# Patient Record
Sex: Male | Born: 1955
Health system: Southern US, Community
[De-identification: ages and names within clinical notes are randomized; demographics above are authoritative.]

## PROBLEM LIST (undated history)

## (undated) DIAGNOSIS — D696 Thrombocytopenia, unspecified: Secondary | ICD-10-CM

## (undated) DIAGNOSIS — E039 Hypothyroidism, unspecified: Secondary | ICD-10-CM

## (undated) DIAGNOSIS — R7303 Prediabetes: Secondary | ICD-10-CM

## (undated) DIAGNOSIS — K7460 Unspecified cirrhosis of liver: Secondary | ICD-10-CM

## (undated) DIAGNOSIS — K7469 Other cirrhosis of liver: Secondary | ICD-10-CM

## (undated) DIAGNOSIS — N189 Chronic kidney disease, unspecified: Secondary | ICD-10-CM

## (undated) DIAGNOSIS — D649 Anemia, unspecified: Secondary | ICD-10-CM

## (undated) DIAGNOSIS — I509 Heart failure, unspecified: Secondary | ICD-10-CM

## (undated) DIAGNOSIS — F329 Major depressive disorder, single episode, unspecified: Secondary | ICD-10-CM

## (undated) DIAGNOSIS — K7581 Nonalcoholic steatohepatitis (NASH): Secondary | ICD-10-CM

## (undated) DIAGNOSIS — R0600 Dyspnea, unspecified: Secondary | ICD-10-CM

## (undated) DIAGNOSIS — J45909 Unspecified asthma, uncomplicated: Secondary | ICD-10-CM

## (undated) DIAGNOSIS — E119 Type 2 diabetes mellitus without complications: Secondary | ICD-10-CM

## (undated) DIAGNOSIS — I10 Essential (primary) hypertension: Secondary | ICD-10-CM

## (undated) DIAGNOSIS — F419 Anxiety disorder, unspecified: Secondary | ICD-10-CM

## (undated) DIAGNOSIS — R161 Splenomegaly, not elsewhere classified: Secondary | ICD-10-CM

## (undated) HISTORY — DX: Unspecified cirrhosis of liver: K75.81

## (undated) HISTORY — DX: Unspecified cirrhosis of liver: K74.60

## (undated) HISTORY — DX: Other cirrhosis of liver: K74.69

## (undated) HISTORY — DX: Major depressive disorder, single episode, unspecified: F32.9

## (undated) HISTORY — PX: OTHER SURGICAL HISTORY: SHX169

## (undated) HISTORY — PX: CHOLECYSTECTOMY: SHX55

## (undated) HISTORY — PX: LAPAROSCOPIC ASSISTED VENTRAL HERNIA REPAIR: SHX6312

## (undated) HISTORY — DX: Hypothyroidism, unspecified: E03.9

## (undated) HISTORY — DX: Splenomegaly, not elsewhere classified: R16.1

## (undated) HISTORY — DX: Chronic kidney disease, unspecified: N18.9

## (undated) HISTORY — DX: Thrombocytopenia, unspecified: D69.6

---

## 2016-02-28 DIAGNOSIS — K635 Polyp of colon: Secondary | ICD-10-CM | POA: Insufficient documentation

## 2016-02-28 DIAGNOSIS — K760 Fatty (change of) liver, not elsewhere classified: Secondary | ICD-10-CM | POA: Insufficient documentation

## 2016-02-28 DIAGNOSIS — E119 Type 2 diabetes mellitus without complications: Secondary | ICD-10-CM | POA: Insufficient documentation

## 2016-09-20 LAB — PROTIME-INR

## 2016-10-10 ENCOUNTER — Encounter (INDEPENDENT_AMBULATORY_CARE_PROVIDER_SITE_OTHER): Payer: Self-pay | Admitting: Internal Medicine

## 2016-10-16 ENCOUNTER — Encounter (INDEPENDENT_AMBULATORY_CARE_PROVIDER_SITE_OTHER): Payer: Self-pay

## 2016-10-16 ENCOUNTER — Ambulatory Visit (INDEPENDENT_AMBULATORY_CARE_PROVIDER_SITE_OTHER): Payer: Medicaid Other | Admitting: Internal Medicine

## 2016-10-16 ENCOUNTER — Encounter (INDEPENDENT_AMBULATORY_CARE_PROVIDER_SITE_OTHER): Payer: Self-pay | Admitting: Internal Medicine

## 2016-10-16 VITALS — BP 184/100 | HR 72 | Temp 98.0°F | Ht 74.0 in | Wt 315.5 lb

## 2016-10-16 DIAGNOSIS — R161 Splenomegaly, not elsewhere classified: Secondary | ICD-10-CM | POA: Diagnosis not present

## 2016-10-16 DIAGNOSIS — F329 Major depressive disorder, single episode, unspecified: Secondary | ICD-10-CM | POA: Insufficient documentation

## 2016-10-16 DIAGNOSIS — K7460 Unspecified cirrhosis of liver: Secondary | ICD-10-CM | POA: Diagnosis not present

## 2016-10-16 DIAGNOSIS — F418 Other specified anxiety disorders: Secondary | ICD-10-CM | POA: Insufficient documentation

## 2016-10-16 DIAGNOSIS — D696 Thrombocytopenia, unspecified: Secondary | ICD-10-CM | POA: Insufficient documentation

## 2016-10-16 DIAGNOSIS — K760 Fatty (change of) liver, not elsewhere classified: Secondary | ICD-10-CM

## 2016-10-16 DIAGNOSIS — E039 Hypothyroidism, unspecified: Secondary | ICD-10-CM

## 2016-10-16 DIAGNOSIS — F32A Depression, unspecified: Secondary | ICD-10-CM

## 2016-10-16 DIAGNOSIS — R188 Other ascites: Secondary | ICD-10-CM | POA: Insufficient documentation

## 2016-10-16 HISTORY — DX: Thrombocytopenia, unspecified: D69.6

## 2016-10-16 HISTORY — DX: Hypothyroidism, unspecified: E03.9

## 2016-10-16 HISTORY — DX: Splenomegaly, not elsewhere classified: R16.1

## 2016-10-16 HISTORY — DX: Unspecified cirrhosis of liver: K74.60

## 2016-10-16 HISTORY — DX: Depression, unspecified: F32.A

## 2016-10-16 LAB — AMMONIA: Ammonia: 108 umol/L — ABNORMAL HIGH (ref <=47)

## 2016-10-16 NOTE — Progress Notes (Signed)
Subjective:    Patient ID: Daniel Crawford, male    DOB: 15-Jul-1955, 61 y.o.   MRN: [id number]  HPI Referred by Alroy Dust A. Mahmoud ([facility]) for NAFLD. Hx of same. Has relocated to [facility]. Use to live in [facility].  Hx of thrombocytopenia. Diagnosed with NAFLD in 2017.  He has never undergone a paracentesis.  He says his liver numbers have been stable since being diagnosed.  No family hx of fatty liver.  No prior hx of jaundice Appetite has been good.  No weight loss. Has a BM daily.  No melena or BRRB. No IV drugs.  Recently evaluated at [place] and treated for infectous colitis with Cipro and flagy.  Per records up to date on Hepatitis A and B vaccine.   10/08/2016 CT scan with and w/o contrast: abdominal pain Stranding about the pancreas and ascending colon. This raises concern for either non complicated pancreatitis or non specific ascending colitis.  No discrete abscess or free fluid. Findings suggestive of hepatic cirrhosis. Splenomegaly Morey be related to liver disease. No discrete hepatic lesion. Cholecystectomy. Ventral paraumbilical hernia contains fat but no bowel. There is some stranding of the fat.  Previous left lower quadrant ventral hernia or inguinal hernia repair. Some fat in the left inguinal canal without associated bowel.    Liver biopsy in 05/30/2015 showed stage 3-4.    Liver with bridging fibrosis and areas of nodular formation consistent with cirrhosis. Mild steatosis and steatohepatitis (ballooning hepatocytes, pericellular fibrosis) is seen. Iron stain negative.    10/08/2016 H and H 11.9 and 356.4,AST 36.6, ALT 22, ALP 82   09/20/2016  AFP 6.7, H and H  12.3 and 37.4, Platelet ct 86, Albumin 3.9, ALP 88, AST 41.1, ALT 27, total bilirubin 1.21, Direct bili 0.32, indirect bilirubin .89   04/17/2016 Colonoscopy:      Impression:- One 25 mm polyp at the ileocecal  valve, removed with    mucosal resection. Resected and retrieved.   - The examination was otherwise normal on direct and    retroflexion views.   - Mucosal resection was performed. Resection and retrieval  Onaka Benard Halsted, MDCollected: 04/17/2016 1556 Ordering Location: GI PERIOP UNCMHReceived:04/18/2016 1126 Pathologist: Regis Bill,   MD  Specimen:Large Intestine, Cecum, Cecal Polyp  [facility]  Final Diagnosis A: Colon, cecum, polypectomy - Lipoma, 2.1 cm in greatest dimension  - Negative for dysplasia          01/24/2016 Colonoscopy : RLQ pain: Tiny sigmoid polyp removed.  Tiny polyp at hepatic flexure, removed. 4 cm sessile polyp/mass at the cecum with focal area of central ulcerations, multiple biopsies obtained. Biopsy: cecal mass: tubular adenoma.  No evidence of malignancy in this biopsy Colon Biopsies: Tubular adenoma, Hyperplastic polyp  01/24/2016 Small bowel, duodenal biopsies: Normal. Stomach biopsy: Benign gastric and squamous mucosa with chronic inflammation. No intestinal metaplasia or dysplasia.   Review of Systems  Past Medical History:  Diagnosis Date  . Cirrhosis ([place]) 10/16/2016  . Depression 10/16/2016  . Hypothyroidism 10/16/2016  . Spleen enlarged 10/16/2016  . Thrombocytopenia ([place]) 10/16/2016    Past Surgical History:  Procedure Laterality Date  . Bilateral hernia surgery     2006, 2007  . CHOLECYSTECTOMY     2016   . Polyp removed     in January of 2018.     No Known Allergies  No current outpatient  prescriptions on file prior to visit.   No current  facility-administered medications on file prior to visit.    Current Outpatient Prescriptions  Medication Sig Dispense Refill  . acetaminophen (TYLENOL) 325 MG tablet Take 650 mg by mouth every 6 (six) hours as needed.    . ciprofloxacin (CIPRO) 500 MG tablet Take 500 mg by mouth 2 (two) times daily.    Marland Kitchen FLUoxetine (PROZAC) 40 MG capsule Take 40 mg by mouth daily.    . furosemide (LASIX) 40 MG tablet Take 40 mg by mouth as needed.    . gabapentin (NEURONTIN) 400 MG capsule Take 400 mg by mouth 3 (three) times daily.    Marland Kitchen levothyroxine (SYNTHROID, LEVOTHROID) 200 MCG tablet Take 200 mcg by mouth daily before breakfast.    . metroNIDAZOLE (FLAGYL) 500 MG tablet Take 500 mg by mouth 3 (three) times daily.    Marland Kitchen spironolactone (ALDACTONE) 50 MG tablet Take 50 mg by mouth as needed.    . traMADol (ULTRAM) 50 MG tablet Take by mouth every 6 (six) hours as needed.     No current facility-administered medications for this visit.         Objective:   Physical Exam Blood pressure (!) 184/100, pulse 72, temperature 98 F (36.7 C), height 6\' 2"  (1.88 m), weight (!) 315 lb 8 oz (143.1 kg). Alert and oriented. Skin warm and dry. Oral mucosa is moist.   . Sclera anicteric, conjunctivae is pink. Thyroid not enlarged. No cervical lymphadenopathy. Lungs clear. Heart regular rate and rhythm.  Abdomen is soft. Bowel sounds are positive. No hepatomegaly. No abdominal masses felt. No tenderness.  No edema to lower extremities.           Assessment & Plan:  NAFLD. CBC, CMET, AFP, PT/INR, Hepatitis C antibody.  OV in 6 months.

## 2016-10-16 NOTE — Patient Instructions (Addendum)
OV in 6 months.  Labs today.  

## 2016-10-17 ENCOUNTER — Telehealth (INDEPENDENT_AMBULATORY_CARE_PROVIDER_SITE_OTHER): Payer: Self-pay | Admitting: Internal Medicine

## 2016-10-17 DIAGNOSIS — K7460 Unspecified cirrhosis of liver: Secondary | ICD-10-CM

## 2016-10-17 LAB — CBC WITH DIFFERENTIAL/PLATELET
BASOS ABS: 0 {cells}/uL (ref 0–200)
BASOS PCT: 0 %
EOS PCT: 3 %
Eosinophils Absolute: 108 cells/uL (ref 15–500)
HCT: 36.5 % — ABNORMAL LOW (ref 38.5–50.0)
HEMOGLOBIN: 12.1 g/dL — AB (ref 13.2–17.1)
LYMPHS ABS: 1332 {cells}/uL (ref 850–3900)
Lymphocytes Relative: 37 %
MCH: 27.6 pg (ref 27.0–33.0)
MCHC: 33.2 g/dL (ref 32.0–36.0)
MCV: 83.3 fL (ref 80.0–100.0)
MPV: 10 fL (ref 7.5–12.5)
Monocytes Absolute: 288 cells/uL (ref 200–950)
Monocytes Relative: 8 %
NEUTROS ABS: 1872 {cells}/uL (ref 1500–7800)
Neutrophils Relative %: 52 %
Platelets: 104 10*3/uL — ABNORMAL LOW (ref 140–400)
RBC: 4.38 MIL/uL (ref 4.20–5.80)
RDW: 16 % — ABNORMAL HIGH (ref 11.0–15.0)
WBC: 3.6 10*3/uL — ABNORMAL LOW (ref 3.8–10.8)

## 2016-10-17 LAB — COMPREHENSIVE METABOLIC PANEL
ALBUMIN: 4 g/dL (ref 3.6–5.1)
ALT: 28 U/L (ref 9–46)
AST: 42 U/L — AB (ref 10–35)
Alkaline Phosphatase: 74 U/L (ref 40–115)
BILIRUBIN TOTAL: 1.1 mg/dL (ref 0.2–1.2)
BUN: 10 mg/dL (ref 7–25)
CALCIUM: 8.9 mg/dL (ref 8.6–10.3)
CHLORIDE: 106 mmol/L (ref 98–110)
CO2: 24 mmol/L (ref 20–31)
Creat: 0.97 mg/dL (ref 0.70–1.33)
GLUCOSE: 86 mg/dL (ref 65–99)
Potassium: 4 mmol/L (ref 3.5–5.3)
Sodium: 138 mmol/L (ref 135–146)
Total Protein: 6.7 g/dL (ref 6.1–8.1)

## 2016-10-17 LAB — AFP TUMOR MARKER: AFP TUMOR MARKER: 8.1 ng/mL — AB (ref <6.1)

## 2016-10-17 LAB — HEPATITIS C ANTIBODY: HCV AB: NEGATIVE

## 2016-10-17 MED ORDER — LACTULOSE 10 GM/15ML PO SOLN: 10.0000 g | Freq: Three times a day (TID) | ORAL | 4 refills | Status: DC

## 2016-10-17 NOTE — Telephone Encounter (Signed)
err

## 2016-10-17 NOTE — Telephone Encounter (Signed)
Rx sent to his pharmacy

## 2016-10-18 ENCOUNTER — Encounter (INDEPENDENT_AMBULATORY_CARE_PROVIDER_SITE_OTHER): Payer: Self-pay | Admitting: *Deleted

## 2016-10-18 ENCOUNTER — Other Ambulatory Visit (INDEPENDENT_AMBULATORY_CARE_PROVIDER_SITE_OTHER): Payer: Self-pay | Admitting: *Deleted

## 2016-10-18 DIAGNOSIS — K7460 Unspecified cirrhosis of liver: Secondary | ICD-10-CM

## 2016-10-18 DIAGNOSIS — R188 Other ascites: Principal | ICD-10-CM

## 2016-10-18 LAB — PROTIME-INR
INR: 1.1
Prothrombin Time: 11.8 s — ABNORMAL HIGH (ref 9.0–11.5)

## 2016-10-23 ENCOUNTER — Telehealth (INDEPENDENT_AMBULATORY_CARE_PROVIDER_SITE_OTHER): Payer: Self-pay | Admitting: Internal Medicine

## 2016-10-23 NOTE — Telephone Encounter (Signed)
Patient called, stated that he spoke to [place] last Wednesday and she told him he needed to come back in two weeks due to his ammonia levels.  I do not have any messages indicating that, so I'm asking when he needs to come in.  There are no openings for next week on the schedule.  He also feels he is possibly having some complications with the medications.  [phone]

## 2016-10-23 NOTE — Telephone Encounter (Signed)
I have spoke with patient. All he needs is lab. He does not need an OV. A letter has been sent for the lab work

## 2016-10-25 ENCOUNTER — Ambulatory Visit (INDEPENDENT_AMBULATORY_CARE_PROVIDER_SITE_OTHER): Payer: Medicaid Other | Admitting: Internal Medicine

## 2016-10-29 ENCOUNTER — Telehealth: Payer: Self-pay | Admitting: Internal Medicine

## 2016-10-29 LAB — AMMONIA: AMMONIA: 83 umol/L — AB (ref <=47)

## 2016-10-29 NOTE — Telephone Encounter (Signed)
Lab called after hours; pts ammonia 83.  Further recommendations per Allen Norris.

## 2016-10-30 NOTE — Telephone Encounter (Signed)
Addressed by Ms. Setzer NP.

## 2016-10-30 NOTE — Telephone Encounter (Signed)
I have spoken with patient 

## 2016-10-31 ENCOUNTER — Other Ambulatory Visit (INDEPENDENT_AMBULATORY_CARE_PROVIDER_SITE_OTHER): Payer: Self-pay | Admitting: *Deleted

## 2016-10-31 ENCOUNTER — Encounter (INDEPENDENT_AMBULATORY_CARE_PROVIDER_SITE_OTHER): Payer: Self-pay | Admitting: *Deleted

## 2016-10-31 DIAGNOSIS — K7460 Unspecified cirrhosis of liver: Secondary | ICD-10-CM

## 2016-11-06 ENCOUNTER — Telehealth (INDEPENDENT_AMBULATORY_CARE_PROVIDER_SITE_OTHER): Payer: Self-pay | Admitting: Internal Medicine

## 2016-11-06 NOTE — Telephone Encounter (Signed)
I don't have any suggestions 

## 2016-11-06 NOTE — Telephone Encounter (Signed)
Patient called, he now doesn't have any insurance.  He is scheduled for labs soon, but is concerned about not being able to pay for it.  He called [employer] to see how much it would be and they told him that he needed to get a test code from [place] so that they can price it.  If he can not afford to have this done, what would be your next suggestion for the patient.  [phone]

## 2016-11-06 NOTE — Telephone Encounter (Signed)
Patient called back, stated not to worry about the test code, he does not have the money to pay anyone for labs.  He also cancelled his appointment in January because he does not have the funds to pay for that either.  Karna Christmas has been informed.

## 2016-11-06 NOTE — Telephone Encounter (Signed)
Daniel Crawford, please see the note and Terri's response.  Do you know anything about a test code?  Also, can't the patient go to the lab at the hospital and be billed for it?

## 2016-11-07 NOTE — Telephone Encounter (Signed)
Noted and this will be shared with Dr.Rehman.

## 2016-11-15 ENCOUNTER — Telehealth (INDEPENDENT_AMBULATORY_CARE_PROVIDER_SITE_OTHER): Payer: Self-pay | Admitting: Internal Medicine

## 2016-11-15 NOTE — Telephone Encounter (Signed)
Patient called, he asked me if I found out anything about him having lab work done.  I explained to him that [employer] will require him to pay up front.  I discussed with Tammy and she is sending his lab orders to [facility], because they will work out payment arrangements with him.  He proceeded to tell me that he still would not be able to pay for it.  I told him that I was not aware of any lab that would draw the labs for free.  He said a lot could happen to him in the next six months.  I again told him Tammy would be sending his orders to [facility] and they will work with him.  He thanked me.

## 2016-11-19 ENCOUNTER — Other Ambulatory Visit (HOSPITAL_COMMUNITY)
Admission: RE | Admit: 2016-11-19 | Discharge: 2016-11-19 | Disposition: A | Discharge status: Home / Self Care | Payer: Medicaid Other | Source: Ambulatory Visit | Attending: Internal Medicine | Admitting: Internal Medicine

## 2016-11-19 LAB — AMMONIA: Ammonia: 35 umol/L (ref 9–35)

## 2017-01-15 ENCOUNTER — Emergency Department (HOSPITAL_COMMUNITY)
Admission: EM | Admit: 2017-01-15 | Discharge: 2017-01-15 | Disposition: A | Discharge status: Home / Self Care | Payer: Self-pay | Attending: Emergency Medicine | Admitting: Emergency Medicine

## 2017-01-15 ENCOUNTER — Encounter (HOSPITAL_COMMUNITY): Payer: Self-pay | Admitting: Emergency Medicine

## 2017-01-15 DIAGNOSIS — Z79899 Other long term (current) drug therapy: Secondary | ICD-10-CM | POA: Insufficient documentation

## 2017-01-15 DIAGNOSIS — K7460 Unspecified cirrhosis of liver: Secondary | ICD-10-CM | POA: Insufficient documentation

## 2017-01-15 DIAGNOSIS — E039 Hypothyroidism, unspecified: Secondary | ICD-10-CM | POA: Insufficient documentation

## 2017-01-15 DIAGNOSIS — R109 Unspecified abdominal pain: Secondary | ICD-10-CM

## 2017-01-15 LAB — LIPASE, BLOOD: LIPASE: 41 U/L (ref 11–51)

## 2017-01-15 LAB — COMPREHENSIVE METABOLIC PANEL
ALT: 30 U/L (ref 17–63)
ANION GAP: 5 (ref 5–15)
AST: 42 U/L — ABNORMAL HIGH (ref 15–41)
Albumin: 3.9 g/dL (ref 3.5–5.0)
Alkaline Phosphatase: 74 U/L (ref 38–126)
BUN: 12 mg/dL (ref 6–20)
CHLORIDE: 104 mmol/L (ref 101–111)
CO2: 29 mmol/L (ref 22–32)
Calcium: 9 mg/dL (ref 8.9–10.3)
Creatinine, Ser: 0.85 mg/dL (ref 0.61–1.24)
GFR calc non Af Amer: >60 mL/min (ref >60)
Glucose, Bld: 116 mg/dL — ABNORMAL HIGH (ref 65–99)
POTASSIUM: 4.5 mmol/L (ref 3.5–5.1)
SODIUM: 138 mmol/L (ref 135–145)
Total Bilirubin: 1.2 mg/dL (ref 0.3–1.2)
Total Protein: 7.2 g/dL (ref 6.5–8.1)

## 2017-01-15 LAB — CBC
HCT: 37.9 % — ABNORMAL LOW (ref 39.0–52.0)
Hemoglobin: 12.2 g/dL — ABNORMAL LOW (ref 13.0–17.0)
MCH: 27.5 pg (ref 26.0–34.0)
MCHC: 32.2 g/dL (ref 30.0–36.0)
MCV: 85.4 fL (ref 78.0–100.0)
Platelets: 77 10*3/uL — ABNORMAL LOW (ref 150–400)
RBC: 4.44 MIL/uL (ref 4.22–5.81)
RDW: 15.1 % (ref 11.5–15.5)
WBC: 3.5 10*3/uL — ABNORMAL LOW (ref 4.0–10.5)

## 2017-01-15 LAB — URINALYSIS, ROUTINE W REFLEX MICROSCOPIC
Bilirubin Urine: NEGATIVE
GLUCOSE, UA: NEGATIVE mg/dL
HGB URINE DIPSTICK: NEGATIVE
Ketones, ur: NEGATIVE mg/dL
Leukocytes, UA: NEGATIVE
Nitrite: NEGATIVE
Protein, ur: NEGATIVE mg/dL
SPECIFIC GRAVITY, URINE: 1.024 (ref 1.005–1.030)
pH: 5 (ref 5.0–8.0)

## 2017-01-15 LAB — AMMONIA: AMMONIA: 41 umol/L — AB (ref 9–35)

## 2017-01-15 MED ORDER — FENTANYL CITRATE (PF) 100 MCG/2ML IJ SOLN
50.0000 ug | Freq: Once | INTRAMUSCULAR | Status: AC
  Administered 2017-01-15: 50 ug via INTRAVENOUS
  Filled 2017-01-15: qty 2

## 2017-01-15 MED ORDER — OXYCODONE-ACETAMINOPHEN 5-325 MG PO TABS: 1.0000 | ORAL_TABLET | ORAL | 0 refills | Status: DC | PRN

## 2017-01-15 NOTE — ED Notes (Signed)
Advised patient we needed a  Urine specimen.

## 2017-01-15 NOTE — ED Notes (Signed)
Pt states his pain is starting to get bad again.

## 2017-01-15 NOTE — Discharge Instructions (Signed)
Tests showed no life-threatening condition. Prescription for pain medicine. Follow-up with Dr. Laural Golden

## 2017-01-15 NOTE — ED Provider Notes (Signed)
[facility] EMERGENCY DEPARTMENT Provider Note   CSN: [id number] Arrival date & time: 01/15/17  1113     History   Chief Complaint Chief Complaint  Patient presents with  . Abdominal Pain    HPI Daniel Crawford is a 61 y.o. male.  Pain in right mid lateral abdomen for extended period of time, getting worse. Patient has a known history of stage lV nonalcoholic cirrhosis and is on liver transplant list. He normally takes lactulose for his elevated ammonia level, but has not been taking it the last 3 days. No fever, sweats, chills, jaundicing.  He feels bloated and weak. He normally is nauseated with some diarrhea. Severity of symptoms is moderate. Nothing makes sxs better or worse      Past Medical History:  Diagnosis Date  . Cirrhosis ([place]) 10/16/2016  . Depression 10/16/2016  . Hypothyroidism 10/16/2016  . Spleen enlarged 10/16/2016  . Thrombocytopenia ([place]) 10/16/2016    Patient Active Problem List   Diagnosis Date Noted  . Hypothyroidism 10/16/2016  . Cirrhosis ([place]) 10/16/2016  . Depression 10/16/2016  . Thrombocytopenia ([place]) 10/16/2016  . Spleen enlarged 10/16/2016    Past Surgical History:  Procedure Laterality Date  . Bilateral hernia surgery     2006, 2007  . CHOLECYSTECTOMY     2016   . Polyp removed     in January of 2018.        Home Medications    Prior to Admission medications   Medication Sig Start Date End Date Taking? Authorizing Provider  acetaminophen (TYLENOL) 325 MG tablet Take 650 mg by mouth every 6 (six) hours as needed.   Yes [provider]  acidophilus (RISAQUAD) CAPS capsule Take 1 capsule by mouth daily.   Yes [provider]  FLUoxetine (PROZAC) 40 MG capsule Take 40 mg by mouth daily.   Yes [provider]  furosemide (LASIX) 40 MG tablet Take 40 mg by mouth as needed.   Yes [provider]  gabapentin (NEURONTIN) 400 MG capsule Take 400 mg by mouth 3 (three) times daily.   Yes [provider]  lactulose (CHRONULAC) 10 GM/15ML solution Take 15 mLs (10 g total) by mouth 3 (three) times daily. 10/17/16  Yes Setzer, Terri L, NP  levothyroxine (SYNTHROID, LEVOTHROID) 200 MCG tablet Take 200 mcg by mouth daily before breakfast.   Yes [provider]  spironolactone (ALDACTONE) 50 MG tablet Take 50 mg by mouth as needed.   Yes [provider]  zinc gluconate 50 MG tablet Take 50 mg by mouth daily.   Yes [provider]  oxyCODONE-acetaminophen (PERCOCET) 5-325 MG tablet Take 1 tablet by mouth every 4 (four) hours as needed. 01/15/17   Nat Christen, MD    Family History No family history on file.  Social History Social History  Substance Use Topics  . Smoking status: Never Smoker  . Smokeless tobacco: Never Used  . Alcohol use No     Allergies   Patient has no known allergies.   Review of Systems Review of Systems  All other systems reviewed and are negative.    Physical Exam Updated Vital Signs BP 131/84   Pulse 71   Temp 98 F (36.7 C) (Oral)   Resp 16   Ht 6\' 2"  (1.88 m)   Wt (!) 147.5 kg (325 lb 4 oz)   SpO2 93%   BMI 41.76 kg/m   Physical Exam  Constitutional: He is oriented to person, place, and time. He appears  well-developed and well-nourished.  HENT:  Head: Normocephalic and atraumatic.  Eyes: Conjunctivae are normal.  Neck: Neck supple.  Cardiovascular: Normal rate and regular rhythm.   Pulmonary/Chest: Effort normal and breath sounds normal.  Abdominal: Soft. Bowel sounds are normal.  Minimal tenderness right mid lateral abdomen.  Musculoskeletal: Normal range of motion.  Neurological: He is alert and oriented to person, place, and time.  Skin: Skin is warm and dry.  Psychiatric: He has a normal mood and affect. His behavior is normal.  Nursing note and vitals reviewed.    ED Treatments / Results  Labs (all labs ordered are listed, but only abnormal results are displayed) Labs Reviewed  COMPREHENSIVE METABOLIC  PANEL - Abnormal; Notable for the following:       Result Value   Glucose, Bld 116 (*)    AST 42 (*)    All other components within normal limits  CBC - Abnormal; Notable for the following:    WBC 3.5 (*)    Hemoglobin 12.2 (*)    HCT 37.9 (*)    Platelets 77 (*)    All other components within normal limits  AMMONIA - Abnormal; Notable for the following:    Ammonia 41 (*)    All other components within normal limits  LIPASE, BLOOD  URINALYSIS, ROUTINE W REFLEX MICROSCOPIC    EKG  EKG Interpretation None       Radiology No results found.  Procedures Procedures (including critical care time)  Medications Ordered in ED Medications  fentaNYL (SUBLIMAZE) injection 50 mcg (50 mcg Intravenous Given 01/15/17 1302)     Initial Impression / Assessment and Plan / ED Course  I have reviewed the triage vital signs and the nursing notes.  Pertinent labs & imaging results that were available during my care of the patient were reviewed by me and considered in my medical decision making (see chart for details).     Patient is hemodynamically stable. Labs including liver functions and ammonia level are stable. Platelet count 77. Discussed findings of tests with the patient and his significant other. 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

## 2017-01-15 NOTE — ED Triage Notes (Signed)
Patient c/o right lower abd pain that radiates into right upper abd. Per patient has stage 4 liver cirrhosis with low platelet count. Patient reports taking lactulose x3 months, patient states started to feel bloated and weak from taking it so he has not taken his lactulose in 2 days. Patient reports nausea and diarrhea which he states is normal .

## 2017-01-29 ENCOUNTER — Telehealth (INDEPENDENT_AMBULATORY_CARE_PROVIDER_SITE_OTHER): Payer: Self-pay | Admitting: Internal Medicine

## 2017-01-29 NOTE — Telephone Encounter (Signed)
Patient called, lmoam, stated that the went to the ER yesterday, heart was ok.  Physicians there think it's related to the cirrhosis.  He is in a lot of pain, a lot of abdominal pain and a lot in of pain in his liver area.  He's not sure what can be done, he doesn't get Medicare until February.  He wants to know if he just needs to lay there and continue suffering or if there is anything that can be done for him.   [phone]

## 2017-01-30 NOTE — Telephone Encounter (Signed)
I will get records from [facility]. I have spoken with patient.

## 2017-02-05 NOTE — Telephone Encounter (Signed)
Requested records for Daniel Crawford

## 2017-02-18 ENCOUNTER — Other Ambulatory Visit: Payer: Self-pay

## 2017-02-18 ENCOUNTER — Emergency Department (HOSPITAL_COMMUNITY)
Admission: EM | Admit: 2017-02-18 | Discharge: 2017-02-18 | Disposition: A | Discharge status: Home / Self Care | Payer: Self-pay | Attending: Emergency Medicine | Admitting: Emergency Medicine

## 2017-02-18 ENCOUNTER — Encounter (HOSPITAL_COMMUNITY): Payer: Self-pay | Admitting: Emergency Medicine

## 2017-02-18 DIAGNOSIS — R1084 Generalized abdominal pain: Secondary | ICD-10-CM | POA: Insufficient documentation

## 2017-02-18 DIAGNOSIS — E039 Hypothyroidism, unspecified: Secondary | ICD-10-CM | POA: Insufficient documentation

## 2017-02-18 DIAGNOSIS — Z79899 Other long term (current) drug therapy: Secondary | ICD-10-CM | POA: Insufficient documentation

## 2017-02-18 DIAGNOSIS — G8929 Other chronic pain: Secondary | ICD-10-CM | POA: Insufficient documentation

## 2017-02-18 LAB — COMPREHENSIVE METABOLIC PANEL
ALK PHOS: 75 U/L (ref 38–126)
ALT: 27 U/L (ref 17–63)
ANION GAP: 5 (ref 5–15)
AST: 43 U/L — ABNORMAL HIGH (ref 15–41)
Albumin: 4.2 g/dL (ref 3.5–5.0)
BILIRUBIN TOTAL: 1.2 mg/dL (ref 0.3–1.2)
BUN: 10 mg/dL (ref 6–20)
CALCIUM: 9.3 mg/dL (ref 8.9–10.3)
CO2: 27 mmol/L (ref 22–32)
CREATININE: 0.94 mg/dL (ref 0.61–1.24)
Chloride: 107 mmol/L (ref 101–111)
Glucose, Bld: 91 mg/dL (ref 65–99)
Potassium: 4.4 mmol/L (ref 3.5–5.1)
Sodium: 139 mmol/L (ref 135–145)
TOTAL PROTEIN: 7.8 g/dL (ref 6.5–8.1)

## 2017-02-18 LAB — CBC WITH DIFFERENTIAL/PLATELET
BASOS ABS: 0 10*3/uL (ref 0.0–0.1)
BASOS PCT: 0 %
Eosinophils Absolute: 0.2 10*3/uL (ref 0.0–0.7)
Eosinophils Relative: 4 %
HEMATOCRIT: 41.6 % (ref 39.0–52.0)
Hemoglobin: 12.8 g/dL — ABNORMAL LOW (ref 13.0–17.0)
Lymphocytes Relative: 34 %
Lymphs Abs: 1.4 10*3/uL (ref 0.7–4.0)
MCH: 26.8 pg (ref 26.0–34.0)
MCHC: 30.8 g/dL (ref 30.0–36.0)
MCV: 87.2 fL (ref 78.0–100.0)
Monocytes Absolute: 0.3 10*3/uL (ref 0.1–1.0)
Monocytes Relative: 6 %
NEUTROS ABS: 2.2 10*3/uL (ref 1.7–7.7)
NEUTROS PCT: 55 %
PLATELETS: 86 10*3/uL — AB (ref 150–400)
RBC: 4.77 MIL/uL (ref 4.22–5.81)
RDW: 15.4 % (ref 11.5–15.5)
WBC: 4 10*3/uL (ref 4.0–10.5)

## 2017-02-18 LAB — LIPASE, BLOOD: Lipase: 42 U/L (ref 11–51)

## 2017-02-18 NOTE — Discharge Instructions (Signed)
Use the resource guide to help you find a primary care doctor.  Take ibuprofen or Tylenol as needed for pain.  Follow-up with your GI doctor soon as possible.

## 2017-02-18 NOTE — ED Triage Notes (Signed)
PT c/o RUQ abdominal pain worsening over the past few weeks. PT states he is waiting on medicare approval so he can go back to Dr. Berna Bue office GI specialist. PT states he has stage IV non-alcoholic cirrhosis and takes lactulose.

## 2017-02-18 NOTE — ED Provider Notes (Signed)
[facility] EMERGENCY DEPARTMENT Provider Note   CSN: [id number] Arrival date & time: 02/18/17  6222     History   Chief Complaint Chief Complaint  Patient presents with  . Abdominal Pain    HPI Daniel Crawford is a 61 y.o. male.  He presents for evaluation of chronic abdominal pain.  He was last in the emergency department for similar pain 1 month ago and at that time was given oxycodone for pain.  He states that he has put up with the pain as long as he can, so today decided to come in.  He does not currently have a PCP or gastroenteroLOGIST.  He is trying to get Medicare so he can see his GI doctor. He denies fever, chills, nausea, vomiting, diarrhea, dysuria, hematuria or urinary frequency.  His stools are chronically loose related to his use of lactulose.  There are no other known modifying factors.  HPI  Past Medical History:  Diagnosis Date  . Cirrhosis ([place]) 10/16/2016  . Depression 10/16/2016  . Hypothyroidism 10/16/2016  . Spleen enlarged 10/16/2016  . Thrombocytopenia ([place]) 10/16/2016    Patient Active Problem List   Diagnosis Date Noted  . Hypothyroidism 10/16/2016  . Cirrhosis ([place]) 10/16/2016  . Depression 10/16/2016  . Thrombocytopenia ([place]) 10/16/2016  . Spleen enlarged 10/16/2016    Past Surgical History:  Procedure Laterality Date  . Bilateral hernia surgery     2006, 2007  . CHOLECYSTECTOMY     2016   . Polyp removed     in January of 2018.        Home Medications    Prior to Admission medications   Medication Sig Start Date End Date Taking? Authorizing Provider  acetaminophen (TYLENOL) 325 MG tablet Take 650 mg by mouth every 6 (six) hours as needed.    [provider]  acidophilus (RISAQUAD) CAPS capsule Take 1 capsule by mouth daily.    [provider]  FLUoxetine (PROZAC) 40 MG capsule Take 40 mg by mouth daily.    [provider]  furosemide (LASIX) 40 MG tablet Take 40 mg by mouth as needed.    [provider]  gabapentin (NEURONTIN) 400 MG capsule Take 400 mg by mouth 3 (three) times daily.    [provider]  lactulose (CHRONULAC) 10 GM/15ML solution Take 15 mLs (10 g total) by mouth 3 (three) times daily. 10/17/16   Setzer, Rona Ravens, NP  levothyroxine (SYNTHROID, LEVOTHROID) 200 MCG tablet Take 200 mcg by mouth daily before breakfast.    [provider]  oxyCODONE-acetaminophen (PERCOCET) 5-325 MG tablet Take 1 tablet by mouth every 4 (four) hours as needed. 01/15/17   Nat Christen, MD  spironolactone (ALDACTONE) 50 MG tablet Take 50 mg by mouth as needed.    [provider]  zinc gluconate 50 MG tablet Take 50 mg by mouth daily.    [provider]    Family History History reviewed. No pertinent family history.  Social History Social History   Tobacco Use  . Smoking status: Never Smoker  . Smokeless tobacco: Never Used  Substance Use Topics  . Alcohol use: No  . Drug use: No     Allergies   Patient has no known allergies.   Review of Systems Review of Systems  All other systems reviewed and are negative.    Physical Exam Updated Vital Signs BP (!) 147/90 (BP Location: Right Arm)   Pulse 98   Temp 98.1 F (36.7 C) (Oral)  Resp 18   Ht 6\' 2"  (1.88 m)   Wt (!) 145.2 kg (320 lb)   SpO2 95%   BMI 41.09 kg/m   Physical Exam  Constitutional: He is oriented to person, place, and time. He appears well-developed and well-nourished. He does not appear ill.  HENT:  Head: Normocephalic and atraumatic.  Right Ear: External ear normal.  Left Ear: External ear normal.  Eyes: Conjunctivae and EOM are normal. Pupils are equal, round, and reactive to light.  Neck: Normal range of motion and phonation normal. Neck supple.  Cardiovascular: Normal rate, regular rhythm and normal heart sounds.  Pulmonary/Chest: Effort normal and breath sounds normal. He exhibits no bony tenderness.  Abdominal: Soft. There is no hepatosplenomegaly. There is  tenderness (Mild) in the right upper quadrant and right lower quadrant. There is no rigidity, no rebound, no guarding and no CVA tenderness. A hernia is present. Hernia confirmed positive in the ventral area (Periumbilical, reducible).  Musculoskeletal: Normal range of motion.  Neurological: He is alert and oriented to person, place, and time. No cranial nerve deficit or sensory deficit. He exhibits normal muscle tone. Coordination normal.  Skin: Skin is warm, dry and intact.  Psychiatric: He has a normal mood and affect. His behavior is normal. Judgment and thought content normal.  Nursing note and vitals reviewed.    ED Treatments / Results  Labs (all labs ordered are listed, but only abnormal results are displayed) Labs Reviewed  COMPREHENSIVE METABOLIC PANEL - Abnormal; Notable for the following components:      Result Value   AST 43 (*)    All other components within normal limits  CBC WITH DIFFERENTIAL/PLATELET - Abnormal; Notable for the following components:   Hemoglobin 12.8 (*)    Platelets 86 (*)    All other components within normal limits  LIPASE, BLOOD    EKG  EKG Interpretation None       Radiology No results found.  Procedures Procedures (including critical care time)  Medications Ordered in ED Medications - No data to display   Initial Impression / Assessment and Plan / ED Course  I have reviewed the triage vital signs and the nursing notes.  Pertinent labs & imaging results that were available during my care of the patient were reviewed by me and considered in my medical decision making (see chart for details).      Patient Vitals for the past 24 hrs:  BP Temp Temp src Pulse Resp SpO2 Height Weight  02/18/17 1022 - - - - - - 6\' 2"  (1.88 m) (!) 145.2 kg (320 lb)  02/18/17 1021 (!) 147/90 98.1 F (36.7 C) Oral 98 18 95 % - -    3:33 PM Reevaluation with update and discussion. After initial assessment and treatment, an updated evaluation reveals  no change in clinical status, he remains comfortable.  Findings discussed with patient and all questions answered. Daleen Bo      Final Clinical Impressions(s) / ED Diagnoses   Final diagnoses:  Generalized abdominal pain   Chronic pain without worrisome findings on exam, or laboratory testing.  Vital signs are reassuring.  No indication for hospitalization or further evaluation at this time.  Nursing Notes Reviewed/ Care Coordinated Applicable Imaging Reviewed Interpretation of Laboratory Data incorporated into ED treatment  The patient appears reasonably screened and/or stabilized for discharge and I doubt any other medical condition or other [facility] requiring further screening, evaluation, or treatment in the ED at this time prior to discharge.  Plan: Home Medications-OTC analgesia, as needed, continue current medications; Home Treatments-rest, fluids; return here if the recommended treatment, does not improve the symptoms; Recommended follow up-PCP, as needed   ED Discharge Orders    None       Daleen Bo, MD 02/18/17 1554

## 2017-04-18 ENCOUNTER — Ambulatory Visit (INDEPENDENT_AMBULATORY_CARE_PROVIDER_SITE_OTHER): Payer: Medicaid Other | Admitting: Internal Medicine

## 2017-05-06 ENCOUNTER — Telehealth (INDEPENDENT_AMBULATORY_CARE_PROVIDER_SITE_OTHER): Payer: Self-pay | Admitting: *Deleted

## 2017-05-06 NOTE — Telephone Encounter (Signed)
Patient called and canceled his appointment for 04/27/17, patient wants Terri to call him, patient stated it is very important that he speaks with Terri. Please advise

## 2017-05-06 NOTE — Telephone Encounter (Signed)
Message left

## 2017-05-06 NOTE — Telephone Encounter (Signed)
Patient returned call

## 2017-05-06 NOTE — Telephone Encounter (Signed)
No answser

## 2017-05-06 NOTE — Telephone Encounter (Signed)
Patient is trying to get Medicare. His liver numbers are normal. I advised him when he had his insurance we would be glad to see him. (We will see him regardless of insurance).] Will get an US abdomen, Hepatic and CBC at his next OV.

## 2017-05-15 ENCOUNTER — Ambulatory Visit (INDEPENDENT_AMBULATORY_CARE_PROVIDER_SITE_OTHER): Payer: Self-pay | Admitting: Internal Medicine

## 2017-07-03 ENCOUNTER — Telehealth (INDEPENDENT_AMBULATORY_CARE_PROVIDER_SITE_OTHER): Payer: Self-pay | Admitting: Internal Medicine

## 2017-07-03 NOTE — Telephone Encounter (Signed)
Patient called wanting medical records - gave verbal consent - stated he is moving to another [place] and wants to take them with him - patient verified demographics - stated he will be in to pick up on Tuesday, 07-09-17

## 2017-07-04 NOTE — Telephone Encounter (Signed)
Records printed, patient aware they are ready

## 2018-04-02 DIAGNOSIS — K7469 Other cirrhosis of liver: Secondary | ICD-10-CM | POA: Diagnosis not present

## 2018-04-07 DIAGNOSIS — E669 Obesity, unspecified: Secondary | ICD-10-CM | POA: Diagnosis not present

## 2018-04-07 DIAGNOSIS — K7460 Unspecified cirrhosis of liver: Secondary | ICD-10-CM | POA: Diagnosis not present

## 2018-04-07 DIAGNOSIS — R69 Illness, unspecified: Secondary | ICD-10-CM | POA: Diagnosis not present

## 2018-04-07 DIAGNOSIS — M545 Low back pain: Secondary | ICD-10-CM | POA: Diagnosis not present

## 2018-04-07 DIAGNOSIS — E039 Hypothyroidism, unspecified: Secondary | ICD-10-CM | POA: Diagnosis not present

## 2018-04-16 DIAGNOSIS — M545 Low back pain: Secondary | ICD-10-CM | POA: Diagnosis not present

## 2018-04-16 DIAGNOSIS — M5136 Other intervertebral disc degeneration, lumbar region: Secondary | ICD-10-CM | POA: Diagnosis not present

## 2018-04-16 DIAGNOSIS — M48061 Spinal stenosis, lumbar region without neurogenic claudication: Secondary | ICD-10-CM | POA: Diagnosis not present

## 2018-04-16 DIAGNOSIS — M4317 Spondylolisthesis, lumbosacral region: Secondary | ICD-10-CM | POA: Diagnosis not present

## 2018-05-07 DIAGNOSIS — K769 Liver disease, unspecified: Secondary | ICD-10-CM | POA: Diagnosis not present

## 2018-05-07 DIAGNOSIS — E039 Hypothyroidism, unspecified: Secondary | ICD-10-CM | POA: Diagnosis not present

## 2018-05-07 DIAGNOSIS — R69 Illness, unspecified: Secondary | ICD-10-CM | POA: Diagnosis not present

## 2018-06-03 DIAGNOSIS — K7460 Unspecified cirrhosis of liver: Secondary | ICD-10-CM | POA: Diagnosis not present

## 2018-06-03 DIAGNOSIS — K769 Liver disease, unspecified: Secondary | ICD-10-CM | POA: Diagnosis not present

## 2018-06-03 DIAGNOSIS — E669 Obesity, unspecified: Secondary | ICD-10-CM | POA: Diagnosis not present

## 2018-06-03 DIAGNOSIS — M545 Low back pain: Secondary | ICD-10-CM | POA: Diagnosis not present

## 2018-06-04 DIAGNOSIS — K7469 Other cirrhosis of liver: Secondary | ICD-10-CM | POA: Diagnosis not present

## 2018-09-25 DIAGNOSIS — Z6841 Body Mass Index (BMI) 40.0 and over, adult: Secondary | ICD-10-CM | POA: Diagnosis not present

## 2018-09-25 DIAGNOSIS — S83241A Other tear of medial meniscus, current injury, right knee, initial encounter: Secondary | ICD-10-CM | POA: Diagnosis not present

## 2018-09-29 DIAGNOSIS — R609 Edema, unspecified: Secondary | ICD-10-CM | POA: Diagnosis not present

## 2018-09-29 DIAGNOSIS — Z6841 Body Mass Index (BMI) 40.0 and over, adult: Secondary | ICD-10-CM | POA: Diagnosis not present

## 2018-09-29 DIAGNOSIS — R69 Illness, unspecified: Secondary | ICD-10-CM | POA: Diagnosis not present

## 2018-09-29 DIAGNOSIS — K7290 Hepatic failure, unspecified without coma: Secondary | ICD-10-CM | POA: Diagnosis not present

## 2018-09-29 DIAGNOSIS — E039 Hypothyroidism, unspecified: Secondary | ICD-10-CM | POA: Diagnosis not present

## 2018-09-29 DIAGNOSIS — Z79899 Other long term (current) drug therapy: Secondary | ICD-10-CM | POA: Diagnosis not present

## 2018-09-29 DIAGNOSIS — K7460 Unspecified cirrhosis of liver: Secondary | ICD-10-CM | POA: Diagnosis not present

## 2018-10-28 DIAGNOSIS — Z79899 Other long term (current) drug therapy: Secondary | ICD-10-CM | POA: Diagnosis not present

## 2018-10-28 DIAGNOSIS — K7460 Unspecified cirrhosis of liver: Secondary | ICD-10-CM | POA: Diagnosis not present

## 2018-10-28 DIAGNOSIS — Z86010 Personal history of colonic polyps: Secondary | ICD-10-CM | POA: Diagnosis not present

## 2018-10-28 DIAGNOSIS — E039 Hypothyroidism, unspecified: Secondary | ICD-10-CM | POA: Diagnosis not present

## 2018-10-28 DIAGNOSIS — D696 Thrombocytopenia, unspecified: Secondary | ICD-10-CM | POA: Diagnosis not present

## 2018-10-28 DIAGNOSIS — R609 Edema, unspecified: Secondary | ICD-10-CM | POA: Diagnosis not present

## 2018-10-28 DIAGNOSIS — R319 Hematuria, unspecified: Secondary | ICD-10-CM | POA: Diagnosis not present

## 2018-12-05 DIAGNOSIS — D696 Thrombocytopenia, unspecified: Secondary | ICD-10-CM | POA: Diagnosis not present

## 2018-12-05 DIAGNOSIS — K7290 Hepatic failure, unspecified without coma: Secondary | ICD-10-CM | POA: Diagnosis not present

## 2018-12-05 DIAGNOSIS — Z79899 Other long term (current) drug therapy: Secondary | ICD-10-CM | POA: Diagnosis not present

## 2018-12-05 DIAGNOSIS — E039 Hypothyroidism, unspecified: Secondary | ICD-10-CM | POA: Diagnosis not present

## 2018-12-05 DIAGNOSIS — Z5320 Procedure and treatment not carried out because of patient's decision for unspecified reasons: Secondary | ICD-10-CM | POA: Diagnosis not present

## 2018-12-05 DIAGNOSIS — K7460 Unspecified cirrhosis of liver: Secondary | ICD-10-CM | POA: Diagnosis not present

## 2019-01-02 DIAGNOSIS — E039 Hypothyroidism, unspecified: Secondary | ICD-10-CM | POA: Diagnosis not present

## 2019-01-02 DIAGNOSIS — Z23 Encounter for immunization: Secondary | ICD-10-CM | POA: Diagnosis not present

## 2019-01-02 DIAGNOSIS — R609 Edema, unspecified: Secondary | ICD-10-CM | POA: Diagnosis not present

## 2019-01-02 DIAGNOSIS — R188 Other ascites: Secondary | ICD-10-CM | POA: Diagnosis not present

## 2019-01-02 DIAGNOSIS — Z79899 Other long term (current) drug therapy: Secondary | ICD-10-CM | POA: Diagnosis not present

## 2019-01-02 DIAGNOSIS — K7460 Unspecified cirrhosis of liver: Secondary | ICD-10-CM | POA: Diagnosis not present

## 2019-01-02 DIAGNOSIS — D696 Thrombocytopenia, unspecified: Secondary | ICD-10-CM | POA: Diagnosis not present

## 2019-01-08 DIAGNOSIS — R609 Edema, unspecified: Secondary | ICD-10-CM | POA: Diagnosis not present

## 2019-01-08 DIAGNOSIS — K7460 Unspecified cirrhosis of liver: Secondary | ICD-10-CM | POA: Diagnosis not present

## 2019-01-08 DIAGNOSIS — E039 Hypothyroidism, unspecified: Secondary | ICD-10-CM | POA: Diagnosis not present

## 2019-01-08 DIAGNOSIS — R188 Other ascites: Secondary | ICD-10-CM | POA: Diagnosis not present

## 2019-01-08 DIAGNOSIS — Z23 Encounter for immunization: Secondary | ICD-10-CM | POA: Diagnosis not present

## 2019-01-08 DIAGNOSIS — R69 Illness, unspecified: Secondary | ICD-10-CM | POA: Diagnosis not present

## 2019-01-08 DIAGNOSIS — Z79899 Other long term (current) drug therapy: Secondary | ICD-10-CM | POA: Diagnosis not present

## 2019-01-08 DIAGNOSIS — D696 Thrombocytopenia, unspecified: Secondary | ICD-10-CM | POA: Diagnosis not present

## 2019-01-13 DIAGNOSIS — K7460 Unspecified cirrhosis of liver: Secondary | ICD-10-CM | POA: Diagnosis not present

## 2019-01-19 DIAGNOSIS — Z86010 Personal history of colonic polyps: Secondary | ICD-10-CM | POA: Diagnosis not present

## 2019-01-19 DIAGNOSIS — K7460 Unspecified cirrhosis of liver: Secondary | ICD-10-CM | POA: Diagnosis not present

## 2019-01-30 DIAGNOSIS — E039 Hypothyroidism, unspecified: Secondary | ICD-10-CM | POA: Diagnosis not present

## 2019-01-30 DIAGNOSIS — K7460 Unspecified cirrhosis of liver: Secondary | ICD-10-CM | POA: Diagnosis not present

## 2019-01-30 DIAGNOSIS — Z6841 Body Mass Index (BMI) 40.0 and over, adult: Secondary | ICD-10-CM | POA: Diagnosis not present

## 2019-01-30 DIAGNOSIS — D696 Thrombocytopenia, unspecified: Secondary | ICD-10-CM | POA: Diagnosis not present

## 2019-01-30 DIAGNOSIS — R188 Other ascites: Secondary | ICD-10-CM | POA: Diagnosis not present

## 2019-01-30 DIAGNOSIS — R69 Illness, unspecified: Secondary | ICD-10-CM | POA: Diagnosis not present

## 2019-01-30 DIAGNOSIS — R609 Edema, unspecified: Secondary | ICD-10-CM | POA: Diagnosis not present

## 2019-01-30 DIAGNOSIS — Z79899 Other long term (current) drug therapy: Secondary | ICD-10-CM | POA: Diagnosis not present

## 2019-01-30 DIAGNOSIS — Z23 Encounter for immunization: Secondary | ICD-10-CM | POA: Diagnosis not present

## 2019-02-27 DIAGNOSIS — K7460 Unspecified cirrhosis of liver: Secondary | ICD-10-CM | POA: Diagnosis not present

## 2019-02-27 DIAGNOSIS — D696 Thrombocytopenia, unspecified: Secondary | ICD-10-CM | POA: Diagnosis not present

## 2019-02-27 DIAGNOSIS — R609 Edema, unspecified: Secondary | ICD-10-CM | POA: Diagnosis not present

## 2019-02-27 DIAGNOSIS — R188 Other ascites: Secondary | ICD-10-CM | POA: Diagnosis not present

## 2019-02-27 DIAGNOSIS — R69 Illness, unspecified: Secondary | ICD-10-CM | POA: Diagnosis not present

## 2019-02-27 DIAGNOSIS — K7290 Hepatic failure, unspecified without coma: Secondary | ICD-10-CM | POA: Diagnosis not present

## 2019-02-27 DIAGNOSIS — E039 Hypothyroidism, unspecified: Secondary | ICD-10-CM | POA: Diagnosis not present

## 2019-02-27 DIAGNOSIS — N6320 Unspecified lump in the left breast, unspecified quadrant: Secondary | ICD-10-CM | POA: Diagnosis not present

## 2019-04-01 DIAGNOSIS — D171 Benign lipomatous neoplasm of skin and subcutaneous tissue of trunk: Secondary | ICD-10-CM | POA: Diagnosis not present

## 2019-04-01 DIAGNOSIS — N6322 Unspecified lump in the left breast, upper inner quadrant: Secondary | ICD-10-CM | POA: Diagnosis not present

## 2019-04-01 DIAGNOSIS — N6321 Unspecified lump in the left breast, upper outer quadrant: Secondary | ICD-10-CM | POA: Diagnosis not present

## 2019-04-01 DIAGNOSIS — N62 Hypertrophy of breast: Secondary | ICD-10-CM | POA: Diagnosis not present

## 2019-05-01 DIAGNOSIS — K7460 Unspecified cirrhosis of liver: Secondary | ICD-10-CM | POA: Diagnosis not present

## 2019-05-01 DIAGNOSIS — E039 Hypothyroidism, unspecified: Secondary | ICD-10-CM | POA: Diagnosis not present

## 2019-05-01 DIAGNOSIS — R188 Other ascites: Secondary | ICD-10-CM | POA: Diagnosis not present

## 2019-05-01 DIAGNOSIS — N6320 Unspecified lump in the left breast, unspecified quadrant: Secondary | ICD-10-CM | POA: Diagnosis not present

## 2019-05-01 DIAGNOSIS — D696 Thrombocytopenia, unspecified: Secondary | ICD-10-CM | POA: Diagnosis not present

## 2019-05-01 DIAGNOSIS — L309 Dermatitis, unspecified: Secondary | ICD-10-CM | POA: Diagnosis not present

## 2019-05-01 DIAGNOSIS — R609 Edema, unspecified: Secondary | ICD-10-CM | POA: Diagnosis not present

## 2019-05-01 DIAGNOSIS — R739 Hyperglycemia, unspecified: Secondary | ICD-10-CM | POA: Diagnosis not present

## 2019-05-01 DIAGNOSIS — K7290 Hepatic failure, unspecified without coma: Secondary | ICD-10-CM | POA: Diagnosis not present

## 2019-05-01 DIAGNOSIS — R69 Illness, unspecified: Secondary | ICD-10-CM | POA: Diagnosis not present

## 2019-05-18 DIAGNOSIS — K7460 Unspecified cirrhosis of liver: Secondary | ICD-10-CM | POA: Diagnosis not present

## 2019-05-29 DIAGNOSIS — K7460 Unspecified cirrhosis of liver: Secondary | ICD-10-CM | POA: Diagnosis not present

## 2019-05-29 DIAGNOSIS — R188 Other ascites: Secondary | ICD-10-CM | POA: Diagnosis not present

## 2019-05-29 DIAGNOSIS — K7290 Hepatic failure, unspecified without coma: Secondary | ICD-10-CM | POA: Diagnosis not present

## 2019-05-29 DIAGNOSIS — R609 Edema, unspecified: Secondary | ICD-10-CM | POA: Diagnosis not present

## 2019-05-29 DIAGNOSIS — Z79899 Other long term (current) drug therapy: Secondary | ICD-10-CM | POA: Diagnosis not present

## 2019-06-19 DIAGNOSIS — K7581 Nonalcoholic steatohepatitis (NASH): Secondary | ICD-10-CM | POA: Insufficient documentation

## 2019-06-19 DIAGNOSIS — R188 Other ascites: Secondary | ICD-10-CM | POA: Insufficient documentation

## 2019-06-19 DIAGNOSIS — K7290 Hepatic failure, unspecified without coma: Secondary | ICD-10-CM | POA: Diagnosis not present

## 2019-06-19 DIAGNOSIS — K7460 Unspecified cirrhosis of liver: Secondary | ICD-10-CM | POA: Diagnosis not present

## 2019-06-19 HISTORY — DX: Other ascites: R18.8

## 2019-06-25 DIAGNOSIS — K7460 Unspecified cirrhosis of liver: Secondary | ICD-10-CM | POA: Diagnosis not present

## 2019-06-25 DIAGNOSIS — Z79899 Other long term (current) drug therapy: Secondary | ICD-10-CM | POA: Diagnosis not present

## 2019-06-25 DIAGNOSIS — R0602 Shortness of breath: Secondary | ICD-10-CM | POA: Diagnosis not present

## 2019-06-25 DIAGNOSIS — I509 Heart failure, unspecified: Secondary | ICD-10-CM | POA: Diagnosis not present

## 2019-06-30 DIAGNOSIS — E039 Hypothyroidism, unspecified: Secondary | ICD-10-CM | POA: Diagnosis not present

## 2019-06-30 DIAGNOSIS — R69 Illness, unspecified: Secondary | ICD-10-CM | POA: Diagnosis not present

## 2019-06-30 DIAGNOSIS — R609 Edema, unspecified: Secondary | ICD-10-CM | POA: Diagnosis not present

## 2019-06-30 DIAGNOSIS — K7460 Unspecified cirrhosis of liver: Secondary | ICD-10-CM | POA: Diagnosis not present

## 2019-07-03 DIAGNOSIS — R69 Illness, unspecified: Secondary | ICD-10-CM | POA: Diagnosis not present

## 2019-07-03 DIAGNOSIS — E559 Vitamin D deficiency, unspecified: Secondary | ICD-10-CM | POA: Diagnosis not present

## 2019-07-03 DIAGNOSIS — R609 Edema, unspecified: Secondary | ICD-10-CM | POA: Diagnosis not present

## 2019-07-03 DIAGNOSIS — E039 Hypothyroidism, unspecified: Secondary | ICD-10-CM | POA: Diagnosis not present

## 2019-07-17 DIAGNOSIS — R609 Edema, unspecified: Secondary | ICD-10-CM | POA: Diagnosis not present

## 2019-07-17 DIAGNOSIS — Z79899 Other long term (current) drug therapy: Secondary | ICD-10-CM | POA: Diagnosis not present

## 2019-07-17 DIAGNOSIS — E559 Vitamin D deficiency, unspecified: Secondary | ICD-10-CM | POA: Diagnosis not present

## 2019-07-17 DIAGNOSIS — R188 Other ascites: Secondary | ICD-10-CM | POA: Diagnosis not present

## 2019-07-17 DIAGNOSIS — E039 Hypothyroidism, unspecified: Secondary | ICD-10-CM | POA: Diagnosis not present

## 2019-07-17 DIAGNOSIS — K7460 Unspecified cirrhosis of liver: Secondary | ICD-10-CM | POA: Diagnosis not present

## 2019-07-17 DIAGNOSIS — K7290 Hepatic failure, unspecified without coma: Secondary | ICD-10-CM | POA: Diagnosis not present

## 2019-07-24 DIAGNOSIS — I509 Heart failure, unspecified: Secondary | ICD-10-CM | POA: Diagnosis not present

## 2019-07-24 DIAGNOSIS — E261 Secondary hyperaldosteronism: Secondary | ICD-10-CM | POA: Diagnosis not present

## 2019-07-24 DIAGNOSIS — Z008 Encounter for other general examination: Secondary | ICD-10-CM | POA: Diagnosis not present

## 2019-07-24 DIAGNOSIS — E039 Hypothyroidism, unspecified: Secondary | ICD-10-CM | POA: Diagnosis not present

## 2019-07-24 DIAGNOSIS — R69 Illness, unspecified: Secondary | ICD-10-CM | POA: Diagnosis not present

## 2019-07-24 DIAGNOSIS — K739 Chronic hepatitis, unspecified: Secondary | ICD-10-CM | POA: Diagnosis not present

## 2019-07-24 DIAGNOSIS — Z6841 Body Mass Index (BMI) 40.0 and over, adult: Secondary | ICD-10-CM | POA: Diagnosis not present

## 2019-07-24 DIAGNOSIS — K7460 Unspecified cirrhosis of liver: Secondary | ICD-10-CM | POA: Diagnosis not present

## 2019-07-24 DIAGNOSIS — I110 Hypertensive heart disease with heart failure: Secondary | ICD-10-CM | POA: Diagnosis not present

## 2019-08-04 DIAGNOSIS — K7460 Unspecified cirrhosis of liver: Secondary | ICD-10-CM | POA: Diagnosis not present

## 2019-08-04 DIAGNOSIS — R609 Edema, unspecified: Secondary | ICD-10-CM | POA: Diagnosis not present

## 2019-08-04 DIAGNOSIS — Z79899 Other long term (current) drug therapy: Secondary | ICD-10-CM | POA: Diagnosis not present

## 2019-08-04 DIAGNOSIS — E039 Hypothyroidism, unspecified: Secondary | ICD-10-CM | POA: Diagnosis not present

## 2019-08-04 DIAGNOSIS — R69 Illness, unspecified: Secondary | ICD-10-CM | POA: Diagnosis not present

## 2019-08-18 DIAGNOSIS — K7290 Hepatic failure, unspecified without coma: Secondary | ICD-10-CM | POA: Diagnosis not present

## 2019-08-18 DIAGNOSIS — E039 Hypothyroidism, unspecified: Secondary | ICD-10-CM | POA: Diagnosis not present

## 2019-08-18 DIAGNOSIS — R609 Edema, unspecified: Secondary | ICD-10-CM | POA: Diagnosis not present

## 2019-08-18 DIAGNOSIS — Z79899 Other long term (current) drug therapy: Secondary | ICD-10-CM | POA: Diagnosis not present

## 2019-09-01 DIAGNOSIS — R69 Illness, unspecified: Secondary | ICD-10-CM | POA: Diagnosis not present

## 2019-09-01 DIAGNOSIS — E669 Obesity, unspecified: Secondary | ICD-10-CM | POA: Diagnosis not present

## 2019-09-01 DIAGNOSIS — R609 Edema, unspecified: Secondary | ICD-10-CM | POA: Diagnosis not present

## 2019-09-01 DIAGNOSIS — Z86010 Personal history of colonic polyps: Secondary | ICD-10-CM | POA: Diagnosis not present

## 2019-09-01 DIAGNOSIS — K7460 Unspecified cirrhosis of liver: Secondary | ICD-10-CM | POA: Diagnosis not present

## 2019-09-02 ENCOUNTER — Other Ambulatory Visit: Payer: Self-pay | Admitting: Gastroenterology

## 2019-09-02 DIAGNOSIS — K7460 Unspecified cirrhosis of liver: Secondary | ICD-10-CM

## 2019-09-04 ENCOUNTER — Ambulatory Visit (HOSPITAL_COMMUNITY): Payer: Medicare HMO

## 2019-09-08 DIAGNOSIS — R609 Edema, unspecified: Secondary | ICD-10-CM | POA: Diagnosis not present

## 2019-09-08 DIAGNOSIS — R69 Illness, unspecified: Secondary | ICD-10-CM | POA: Diagnosis not present

## 2019-09-08 DIAGNOSIS — K7290 Hepatic failure, unspecified without coma: Secondary | ICD-10-CM | POA: Diagnosis not present

## 2019-09-08 DIAGNOSIS — K222 Esophageal obstruction: Secondary | ICD-10-CM | POA: Diagnosis not present

## 2019-09-08 DIAGNOSIS — E039 Hypothyroidism, unspecified: Secondary | ICD-10-CM | POA: Diagnosis not present

## 2019-09-08 DIAGNOSIS — R1310 Dysphagia, unspecified: Secondary | ICD-10-CM | POA: Diagnosis not present

## 2019-09-08 DIAGNOSIS — Z79899 Other long term (current) drug therapy: Secondary | ICD-10-CM | POA: Diagnosis not present

## 2019-09-08 DIAGNOSIS — K7460 Unspecified cirrhosis of liver: Secondary | ICD-10-CM | POA: Diagnosis not present

## 2019-09-08 DIAGNOSIS — R739 Hyperglycemia, unspecified: Secondary | ICD-10-CM | POA: Diagnosis not present

## 2019-09-09 ENCOUNTER — Other Ambulatory Visit: Payer: Self-pay

## 2019-09-09 ENCOUNTER — Ambulatory Visit (HOSPITAL_COMMUNITY)
Admission: RE | Admit: 2019-09-09 | Discharge: 2019-09-09 | Disposition: A | Discharge status: Home / Self Care | Payer: Medicare HMO | Source: Ambulatory Visit | Attending: Gastroenterology | Admitting: Gastroenterology

## 2019-09-09 DIAGNOSIS — K7460 Unspecified cirrhosis of liver: Secondary | ICD-10-CM | POA: Insufficient documentation

## 2019-09-17 DIAGNOSIS — R1310 Dysphagia, unspecified: Secondary | ICD-10-CM | POA: Diagnosis not present

## 2019-09-17 DIAGNOSIS — Z79899 Other long term (current) drug therapy: Secondary | ICD-10-CM | POA: Diagnosis not present

## 2019-09-17 DIAGNOSIS — R739 Hyperglycemia, unspecified: Secondary | ICD-10-CM | POA: Diagnosis not present

## 2019-09-17 DIAGNOSIS — R609 Edema, unspecified: Secondary | ICD-10-CM | POA: Diagnosis not present

## 2019-09-17 DIAGNOSIS — E039 Hypothyroidism, unspecified: Secondary | ICD-10-CM | POA: Diagnosis not present

## 2019-09-17 DIAGNOSIS — K7460 Unspecified cirrhosis of liver: Secondary | ICD-10-CM | POA: Diagnosis not present

## 2019-09-17 DIAGNOSIS — R69 Illness, unspecified: Secondary | ICD-10-CM | POA: Diagnosis not present

## 2019-09-17 DIAGNOSIS — K7290 Hepatic failure, unspecified without coma: Secondary | ICD-10-CM | POA: Diagnosis not present

## 2019-09-17 DIAGNOSIS — K222 Esophageal obstruction: Secondary | ICD-10-CM | POA: Diagnosis not present

## 2019-10-22 ENCOUNTER — Other Ambulatory Visit (HOSPITAL_COMMUNITY): Payer: Self-pay | Admitting: Specialist

## 2019-10-22 ENCOUNTER — Other Ambulatory Visit (INDEPENDENT_AMBULATORY_CARE_PROVIDER_SITE_OTHER): Payer: Self-pay | Admitting: *Deleted

## 2019-10-22 ENCOUNTER — Encounter (INDEPENDENT_AMBULATORY_CARE_PROVIDER_SITE_OTHER): Payer: Self-pay | Admitting: *Deleted

## 2019-10-22 ENCOUNTER — Encounter (INDEPENDENT_AMBULATORY_CARE_PROVIDER_SITE_OTHER): Payer: Self-pay | Admitting: Gastroenterology

## 2019-10-22 ENCOUNTER — Ambulatory Visit (INDEPENDENT_AMBULATORY_CARE_PROVIDER_SITE_OTHER): Payer: Medicare HMO | Admitting: Gastroenterology

## 2019-10-22 ENCOUNTER — Other Ambulatory Visit: Payer: Self-pay

## 2019-10-22 VITALS — BP 151/88 | HR 82 | Temp 97.9°F | Ht 74.0 in | Wt 366.4 lb

## 2019-10-22 DIAGNOSIS — Z87898 Personal history of other specified conditions: Secondary | ICD-10-CM | POA: Diagnosis not present

## 2019-10-22 DIAGNOSIS — K7581 Nonalcoholic steatohepatitis (NASH): Secondary | ICD-10-CM | POA: Diagnosis not present

## 2019-10-22 DIAGNOSIS — R1319 Other dysphagia: Secondary | ICD-10-CM

## 2019-10-22 DIAGNOSIS — R1310 Dysphagia, unspecified: Secondary | ICD-10-CM | POA: Insufficient documentation

## 2019-10-22 DIAGNOSIS — R1312 Dysphagia, oropharyngeal phase: Secondary | ICD-10-CM

## 2019-10-22 DIAGNOSIS — E039 Hypothyroidism, unspecified: Secondary | ICD-10-CM | POA: Diagnosis not present

## 2019-10-22 DIAGNOSIS — K7460 Unspecified cirrhosis of liver: Secondary | ICD-10-CM

## 2019-10-22 NOTE — Patient Instructions (Signed)
Schedule EGD with dilation Perform blood workup Schedule modified barium swallow Follow up with your doctor in [place] for liver cirrhosis (per your request)

## 2019-10-22 NOTE — Progress Notes (Signed)
Daniel Crawford, M.D. Gastroenterology & Hepatology [facility] [address] [place], [place] [address] Primary Care Physician: Daniel Douglas, MD [address] [place] [address]  Referring MD: PCP  I will communicate my assessment and recommendations to the referring MD via EMR. Note: Occasional unusual wording and randomly placed punctuation marks Cu[place] result from the use of speech recognition technology to transcribe this document"  Chief Complaint: Dysphagia  History of Present Illness: Daniel Crawford is a 64 y.o. male with PMH hypothyroidism, NASH cirrhosis c/b ascites and HE, and depression, who presents for evaluation of dysphagia.  The patient reports that for the last 8 weeks he has noticed dysphagia to both solids and liquids, usually when he swallows cold meals or drinks.  He has also noticed that it is difficult for him to swallow pills as it feels that the pills get stuck in his throat.  He feels occasionally episodes of choking that have affected him significantly.  Occasionally has odynophagia.  Due to the feeling of discomfort in his neck, he presents episodes of retching at night and occasionally some vomiting even when he has not eaten recently.  Of note, he reports that he has been gaining weight despite of his symptoms.  He denies any heartburn, nausea, vomiting, fever, chills, hematochezia, melena, hematemesis, abdominal pain, diarrhea, jaundice, pruritus.  Of note, the patient has a history of Daniel Crawford cirrhosis diagnosed since 2017 after he underwent a liver biopsy.  He had a thorough evaluation in the past which rule out viral hepatitis, autoimmune disorders or metabolic abnormalities leading to his liver dysfunction.  He is immune to hepatitis A.  He follows in [place] with Dr. Arta Silence, who he wants to continue to see for management of history of liver cirrhosis.  He was recently evaluated at [facility] in June 19, 2019 by Dr. Dorris Fetch.  The patient had a low meld score of 6 at that time.  He was not considered to be a candidate for liver transplant at this moment as he had a BMI of more than 40.  The patient reports that he is disease has been complicated by episodes of ascites.  Never required a paracentesis in the past but has been on furosemide, most recently on 60 mg every day.  He has been taking lactulose for episodes of fogginess which have improved with it.  Currently has 3 bowel movements per day.  Denies any episodes of gastrointestinal bleeding in the past.  I personally reviewed and interpreted the available lab results from outside facility, calculated meld score on late March 2021 was 9.  He also had severe elevation of his TSH  - was 19.3 in 3/26.  Regarding his Daniel Crawford, he is scheduled to see a nutritionist this month to get him to tailor his diet.  Patient had a liver ultrasound on 09/09/2019 which was negative for any liver masses.  Last EGD: 2017 - normal mucosa of the proximal and mid esophagus, short segment salmon mucosa above the Z -line which was biopsied (benign mucosa), EGJ noted at 45cm from incisors diffuse portal hypertensive gastropathy involving the body and fundus which was biopsied (focal mild chronic inflammation), and unremarkable duodenal mucosa which was biopsied to rule out celiac disease (benign mucosa).   Small bowel, duodenal biopsies: Normal. Stomach biopsy: Benign gastric and squamous mucosa with chronic inflammation. No intestinal metaplasia or dysplasia.   Last Colonoscopy: 04/17/2016 Colonoscopy:    Impression:- One 25 mm polyp at  the ileocecal valve, removed with mucosal resection. Resected and retrieved. Pathology; Lipoma, 2.1 cm in greatest dimension   FHx: mother had NASH, neg for any gastrointestinal/liver disease, mother lung cancer, grandmother bladder cancer Social: neg smoking, alcohol or illicit drug  use Surgical: bilateral inguinal hernia repair, cholecystectomy and umbilical hernia  Past Medical History: Past Medical History:  Diagnosis Date  . Cirrhosis ([place]) 10/16/2016  . Depression 10/16/2016  . Hypothyroidism 10/16/2016  . Spleen enlarged 10/16/2016  . Thrombocytopenia ([place]) 10/16/2016    Past Surgical History: Past Surgical History:  Procedure Laterality Date  . Bilateral hernia surgery     2006, 2007  . CHOLECYSTECTOMY     2016   . Polyp removed     in January of 2018.     Family History:No family history on file.  Social History: Social History   Tobacco Use  Smoking Status Never Smoker  Smokeless Tobacco Never Used   Social History   Substance and Sexual Activity  Alcohol Use No   Social History   Substance and Sexual Activity  Drug Use No    Allergies: No Known Allergies  Medications: Current Outpatient Medications  Medication Sig Dispense Refill  . Acetaminophen 500 MG capsule Take 500 mg by mouth in the morning, at noon, and at bedtime.     . carvedilol (COREG) 12.5 MG tablet Take 12.5 mg by mouth 2 (two) times daily with a meal.    . cholecalciferol (VITAMIN D3) 25 MCG (1000 UNIT) tablet Take 1,000 Units by mouth daily.    . furosemide (LASIX) 40 MG tablet Take 20 mg by mouth. Patient reports that he takes the 3 all at one time for a total of 60 mg daily,    . hydrOXYzine (VISTARIL) 50 MG capsule Take 50 mg by mouth in the morning and at bedtime.    Marland Kitchen lactulose (CHRONULAC) 10 GM/15ML solution Take 15 mLs (10 g total) by mouth 3 (three) times daily. 480 mL 4  . levothyroxine (SYNTHROID, LEVOTHROID) 200 MCG tablet Take 200 mcg by mouth daily before breakfast.    . sertraline (ZOLOFT) 25 MG tablet Take 25 mg by mouth daily.    . traZODone (DESYREL) 50 MG tablet Take 50 mg by mouth at bedtime.     No current facility-administered medications for this visit.    Review of Systems: GENERAL: negative for malaise, night sweats HEENT: No changes in  hearing or vision, no nose bleeds or other nasal problems. NECK: Negative for lumps, goiter, pain and significant neck swelling RESPIRATORY: Negative for cough, wheezing CARDIOVASCULAR: Negative for chest pain, leg swelling, palpitations, orthopnea GI: SEE HPI MUSCULOSKELETAL: Negative for joint pain or swelling, back pain, and muscle pain. SKIN: Negative for lesions, rash PSYCH: Negative for sleep disturbance, mood disorder and recent psychosocial stressors. HEMATOLOGY Negative for prolonged bleeding, bruising easily, and swollen nodes. ENDOCRINE: Negative for cold or heat intolerance, polyuria, polydipsia and goiter. NEURO: negative for tremor, gait imbalance, syncope and seizures. The remainder of the review of systems is noncontributory.   Physical Exam: BP (!) 151/88 (BP Location: Right Arm, Patient Position: Sitting, Cuff Size: Large)   Pulse 82   Temp 97.9 F (36.6 C) (Oral)   Ht 6\' 2"  (1.88 m)   Wt (!) 366 lb 6.4 oz (166.2 kg)   BMI 47.04 kg/m  GENERAL: The patient is AO x3, in no acute distress. HEENT: Head is normocephalic and atraumatic. EOMI are intact. Mouth is well hydrated and without lesions. NECK: Supple. No masses  LUNGS: Clear to auscultation. No presence of rhonchi/wheezing/rales. Adequate chest expansion HEART: RRR, normal s1 and s2. ABDOMEN: Soft, nontender, no guarding, no peritoneal signs, and nondistended. BS +. No masses. EXTREMITIES: Without any cyanosis, clubbing, rash, lesions or edema. NEUROLOGIC: AOx3, no focal motor deficit. SKIN: no jaundice, no rashes  Imaging/Labs: as above  I personally reviewed and interpreted the available labs, imaging and endoscopic files.  Impression and Plan: Marzell Isola is a 64 y.o. male with PMH hypothyroidism, NASH cirrhosis c/b ascites and HE, and depression, who presents for evaluation of dysphagia. Patient has had persistent dysphagia to both solids and liquids without red flag signs. It is unclear if the dysphagia is  pharyngeal or esophageal, although I suspect it could be pharyngeal given the onset and duration of his symptoms.  For this, an EGD and a barium esophagram will be performed.  We will perform dilation if narrowing is present.  If not, will perform biopsies from esophagus to rule out EOE, although this seems like it less likely etiology.  The patient would like to continue his care for his NASH cirrhosis at [facility] but will check a TSH, CMP, CBC, INR and AFP at this moment.  He was also counseled to follow with the nutritionist to implement dietary changes such as Mediterranean diet to lose at least 7.5% of his weight.  - Schedule EGD with dilation - Schedule modified barium swallow -Follow-up with nutritionist - Explained to the patient the etiology and consequences of his current liver disease. Patient was counseled about the benefit of implementing a Mediterranean diet and exercise plan to decrease at least 7.5% of weight. The patient understood about the importance of lifestyle changes to potentially reverse his liver involvement. - check a TSH, CMP, CBC, INR and AFP - Follow up with Dr. Paulita Fujita for NASH cirrhosis  All questions were answered.      Harvel Quale, MD Gastroenterology and Hepatology [facility]

## 2019-10-23 ENCOUNTER — Telehealth (INDEPENDENT_AMBULATORY_CARE_PROVIDER_SITE_OTHER): Payer: Self-pay | Admitting: Gastroenterology

## 2019-10-23 LAB — COMPREHENSIVE METABOLIC PANEL
AG Ratio: 1.4 (calc) (ref 1.0–2.5)
ALT: 22 U/L (ref 9–46)
AST: 41 U/L — ABNORMAL HIGH (ref 10–35)
Albumin: 3.9 g/dL (ref 3.6–5.1)
Alkaline phosphatase (APISO): 74 U/L (ref 35–144)
BUN: 12 mg/dL (ref 7–25)
CO2: 32 mmol/L (ref 20–32)
Calcium: 9.1 mg/dL (ref 8.6–10.3)
Chloride: 105 mmol/L (ref 98–110)
Creat: 1.11 mg/dL (ref 0.70–1.25)
Globulin: 2.8 g/dL (calc) (ref 1.9–3.7)
Glucose, Bld: 131 mg/dL (ref 65–139)
Potassium: 4 mmol/L (ref 3.5–5.3)
Sodium: 141 mmol/L (ref 135–146)
Total Bilirubin: 1.6 mg/dL — ABNORMAL HIGH (ref 0.2–1.2)
Total Protein: 6.7 g/dL (ref 6.1–8.1)

## 2019-10-23 LAB — PROTIME-INR
INR: 1.1
Prothrombin Time: 12 s — ABNORMAL HIGH (ref 9.0–11.5)

## 2019-10-23 LAB — CBC
HCT: 40.3 % (ref 38.5–50.0)
Hemoglobin: 13.7 g/dL (ref 13.2–17.1)
MCH: 28.7 pg (ref 27.0–33.0)
MCHC: 34 g/dL (ref 32.0–36.0)
MCV: 84.3 fL (ref 80.0–100.0)
MPV: 10 fL (ref 7.5–12.5)
Platelets: 84 10*3/uL — ABNORMAL LOW (ref 140–400)
RBC: 4.78 10*6/uL (ref 4.20–5.80)
RDW: 14.9 % (ref 11.0–15.0)
WBC: 3 10*3/uL — ABNORMAL LOW (ref 3.8–10.8)

## 2019-10-23 LAB — TSH: TSH: 0.4 mIU/L (ref 0.40–4.50)

## 2019-10-23 LAB — AFP TUMOR MARKER: AFP-Tumor Marker: 9.1 ng/mL — ABNORMAL HIGH (ref <6.1)

## 2019-10-23 NOTE — Telephone Encounter (Signed)
I called the patient to inform him about the results of his recent blood testing.  AFP is less than 20, MELD labs are adequate.  TSH has normalized.  We will proceed with EGD and swallowing test as scheduled.  [name], MD Gastroenterology and Hepatology [facility]

## 2019-10-27 ENCOUNTER — Encounter: Payer: Self-pay | Admitting: Nutrition

## 2019-10-27 ENCOUNTER — Other Ambulatory Visit: Payer: Self-pay

## 2019-10-27 ENCOUNTER — Encounter: Payer: Medicare HMO | Attending: Gastroenterology | Admitting: Nutrition

## 2019-10-27 VITALS — Ht 74.0 in | Wt 373.0 lb

## 2019-10-27 DIAGNOSIS — Z87898 Personal history of other specified conditions: Secondary | ICD-10-CM | POA: Insufficient documentation

## 2019-10-27 DIAGNOSIS — K7460 Unspecified cirrhosis of liver: Secondary | ICD-10-CM | POA: Insufficient documentation

## 2019-10-27 DIAGNOSIS — K7581 Nonalcoholic steatohepatitis (NASH): Secondary | ICD-10-CM | POA: Insufficient documentation

## 2019-10-27 DIAGNOSIS — F320 Major depressive disorder, single episode, mild: Secondary | ICD-10-CM | POA: Insufficient documentation

## 2019-10-27 NOTE — Patient Instructions (Addendum)
Goals   Try Healthy Choice or Smart One meals for less sodium Follow Low Slat Diet Schedule appt with therapist.  Talk to Dr. About MVI daily. Eat small frequent meals Avoid soda, juice and fast foods. Use Mrs Deliah Boston and herbs and spices

## 2019-10-27 NOTE — Progress Notes (Signed)
Medical Nutrition Therapy:  Appt start time: 4944 end time:  1630.  Assessment:  Primary concerns today: Obesity and NASH Cirrhosis Stg 4.. He notes he has had  NASH for 4 + years. SOB when walking. Had to take off his mask in office. Has been vaccinated. Not on oxygen.  Had a sleep study and no sleep apnea. Has abdomen swelling. Complains of deteriorated quality of life due to having to take fluid pills and lactulose so often, he can't get out of the house or go places due to having to urinate every 7 minutes when he takes lactulose or 80 mg of lasix. He gets weak, fatigue and worn out when taking that dose of Lasix. Hasn't had a paracentesis. Denies any mental confusion  Sees Dr. Marlon Pel at  [facility] clinic for PCP.   Admits to being depressed because of his inability to go places and do what he wants to do. Frustrated with the 100 lbs weight gain in the last few years. Less mobile and ambulatory that he was before he got NASH.  Admits to some anxiety and panic attacks.  Wants to lose weight. Frustrated he can't breath hardly and can't go anywhere without totally giving out. He says he  lost 12 lbs of fluid. But gained 6 lbs back due to having appts and not being able to take his fluid pills as needed.  Weighs at home daily. Taking fluid pill-lasix  60-80 mg and lactulose. BM's regular a few times per day. Sees Dr. Fae Pippin, GI in [place] and sees Dr. Jenetta Downer GI In [place]  for EGD and esophagus issue. [place] is too far to drive due to his deconditioned state and breathing issues.  Getting xray of throat done tomorrow and EGD next week. 13th of Aug   Complains of getting strangled or spasms in throat and gets choked on some foods and some pills at times..  .  Willing to talk to a therapist and suppose to discuss that at his PCP appt. Willing to work on improving diet and doing possible chair exercises as tolerated.   Preferred Learning Style:  No preference indicated   Learning  Readiness:    Ready  Change in progress   MEDICATIONS:    DIETARY INTAKE:   24-hr recall:  B ( AM): Frosted mini wheats-  2 cups, milk, sometimes fruit and water.  Snk ( AM):  L ( PM): Tomato sandwich on whole wheat bread,water Snk ( PM): fruit D ( PM): Green beans, banquet- salsbury steak, water or tea/diet soda Snk ( PM): low salt popcorn at times.  Beverages: water and misc   Usual physical activity: ADL   Estimated energy needs: 1600  calories 180  g carbohydrates 120 g protein 44 g fat  Progress Towards Goal(s):  In progress.   Nutritional Diagnosis:  NB-1.1 Food and nutrition-related knowledge deficit As related to [facility].  As evidenced by 100 lb weight gain, Ascites from NASH.    Intervention:  Heart Healthy Diet, Low sodium, portion sizes, meal planning, benefits of exercise as tolerated. Reading food labels. MY Plate, Timing of meals. Healthy Choice or Smart Ones TV dinners allowed..   Goals   Try Healthy Choice or Smart One meals for less sodium Follow Low Slat Diet Schedule appt with therapist.  Talk to Dr. About MVI daily. Eat small frequent meals Avoid soda, juice and fast foods. Use Mrs Deliah Boston and herbs and spices  Teaching Method Utilized:  Visual Auditory Hands on  Handouts  given during visit include:  The Plate Method  Low Salt Diet/NASH handout  Weight loss tips  Barriers to learning/adherence to lifestyle change: SOB limits walking   Demonstrated degree of understanding via:  Teach Back   Monitoring/Evaluation:  Dietary intake, exercise,  and body weight in 1 month(s).

## 2019-10-28 ENCOUNTER — Ambulatory Visit (HOSPITAL_COMMUNITY): Payer: Medicare HMO | Attending: Gastroenterology | Admitting: Speech Pathology

## 2019-10-28 ENCOUNTER — Telehealth (INDEPENDENT_AMBULATORY_CARE_PROVIDER_SITE_OTHER): Payer: Self-pay | Admitting: Gastroenterology

## 2019-10-28 ENCOUNTER — Encounter (HOSPITAL_COMMUNITY): Payer: Self-pay | Admitting: Speech Pathology

## 2019-10-28 ENCOUNTER — Ambulatory Visit (HOSPITAL_COMMUNITY)
Admission: RE | Admit: 2019-10-28 | Discharge: 2019-10-28 | Disposition: A | Discharge status: Home / Self Care | Payer: Medicare HMO | Source: Ambulatory Visit | Attending: Gastroenterology | Admitting: Gastroenterology

## 2019-10-28 DIAGNOSIS — R1312 Dysphagia, oropharyngeal phase: Secondary | ICD-10-CM | POA: Diagnosis not present

## 2019-10-28 DIAGNOSIS — Z0389 Encounter for observation for other suspected diseases and conditions ruled out: Secondary | ICD-10-CM | POA: Diagnosis not present

## 2019-10-28 NOTE — Telephone Encounter (Signed)
Called patient, did not pick up the phone. Left voice message explaining I reviewed his swallow test. He should continue with the speech and swallow therapy as recommended during his encounter with the therapist today, he will need to be evaluated by neurology for neurologic causes of oropharyngeal dysphagia.  Ann, can you please help me sending a referral for neurology?  Diagnosis:  oropharyngeal dysphagia  Thank you,  [name], MD Gastroenterology and Hepatology [facility]

## 2019-10-28 NOTE — Therapy (Signed)
[place], [place], [address] Phone: [phone]   Fax:  [phone]  Modified Barium Swallow  Patient Details  Name: Daniel Crawford MRN: [id number] Date of Birth: [birth year] No data recorded  Encounter Date: 10/28/2019   End of Session - 10/28/19 1255    Visit Number 1    Number of Visits 1    Authorization Type [employer]    SLP Start Time 1138    SLP Stop Time  1215    SLP Time Calculation (min) 37 min    Activity Tolerance Patient tolerated treatment well           Past Medical History:  Diagnosis Date  . Cirrhosis ([place]) 10/16/2016  . Depression 10/16/2016  . Hypothyroidism 10/16/2016  . Spleen enlarged 10/16/2016  . Thrombocytopenia ([place]) 10/16/2016    Past Surgical History:  Procedure Laterality Date  . Bilateral hernia surgery     2006, 2007  . CHOLECYSTECTOMY     2016   . Polyp removed     in January of 2018.     There were no vitals filed for this visit.   Subjective Assessment - 10/28/19 1242    Subjective "Some foods just back up on me."    Special Tests MBSS    Currently in Pain? No/denies               General - 10/28/19 1247      General Information   Date of Onset 09/24/19    HPI Josemaria Sturgill is a 64 y.o. male with PMH hypothyroidism, NASH cirrhosis c/b ascites and HE, and depression, who presents for evaluation of dysphagia after referral from Dr. Jenetta Downer (GI) for MBSS. Patient has had persistent dysphagia to both solids and liquids for the past several months. He is scheduled for EGD in a couple of weeks.    Type of Study MBS-Modified Barium Swallow Study    Diet Prior to this Study Regular;Thin liquids    Temperature Spikes Noted No    Respiratory Status Room air    History of Recent Intubation No    Behavior/Cognition Alert;Cooperative;Pleasant mood    Oral Cavity Assessment Within Functional Limits    Oral Care Completed by SLP No    Oral Cavity - Dentition Adequate  natural dentition    Vision Functional for self feeding    Self-Feeding Abilities Able to feed self    Patient Positioning Upright in chair    Baseline Vocal Quality Normal    Volitional Cough Strong    Volitional Swallow Able to elicit    Anatomy Within functional limits    Pharyngeal Secretions Not observed secondary MBS              Oral Preparation/Oral Phase - 10/28/19 1248      Oral Preparation/Oral Phase   Oral Phase Impaired      Oral - Thin   Oral - Thin Cup Other (Comment)   premature spillage     Electrical stimulation - Oral Phase   Was Electrical Stimulation Used No            Pharyngeal Phase - 10/28/19 1249      Pharyngeal Phase   Pharyngeal Phase Impaired      Pharyngeal - Nectar   Pharyngeal- Nectar Straw Within functional limits   observed NTL for AP view, bilateral transit     Pharyngeal - Thin   Pharyngeal- Thin Teaspoon Swallow initiation at vallecula;Swallow initiation at  pyriform sinus;Reduced epiglottic inversion;Reduced tongue base retraction;Pharyngeal residue - valleculae;Pharyngeal residue - pyriform   min residuals in valleculae and pyriforms   Pharyngeal- Thin Cup Swallow initiation at vallecula;Swallow initiation at pyriform sinus;Reduced epiglottic inversion;Reduced tongue base retraction;Pharyngeal residue - valleculae;Pharyngeal residue - pyriform    Pharyngeal- Thin Straw Swallow initiation at vallecula;Swallow initiation at pyriform sinus;Reduced epiglottic inversion;Reduced tongue base retraction;Pharyngeal residue - pyriform;Pharyngeal residue - valleculae      Pharyngeal - Solids   Pharyngeal- Puree Swallow initiation at vallecula;Reduced epiglottic inversion;Reduced tongue base retraction;Pharyngeal residue - valleculae;Pharyngeal residue - pyriform   mi/mod pharyngeal residuals   Pharyngeal- Regular Delayed swallow initiation-vallecula;Reduced epiglottic inversion;Reduced tongue base retraction;Pharyngeal residue -  pyriform;Pharyngeal residue - valleculae   mi/mod pharyngeal residuals   Pharyngeal- Pill Penetration/Aspiration during swallow   flash penetration of thins when taking pill   Pharyngeal Material enters airway, remains ABOVE vocal cords then ejected out      Pharyngeal Phase - Comment   Pharyngeal Comment min delay in swallow initiation, reduced tongue base retraction, and reduced epiglottic deflection resulting in pharyngeal residuals      Electrical Stimulation - Pharyngeal Phase   Was Electrical Stimulation Used No            Cricopharyngeal Phase - 10/28/19 1254      Cervical Esophageal Phase   Cervical Esophageal Phase Within functional limits   delayed emptying in distal esophagus               SLP Short Term Goals - 10/28/19 1300      SLP SHORT TERM GOAL #1   Title Pt will complete pharyngeal exercises as assigned with use of written cues as needed.    Baseline Not currently completing    Time 4    Period Weeks    Status New    Target Date 12/10/19              Plan - 10/28/19 1256    Clinical Impression Statement Pt presents with mild/mod pharyngeal phase dysphagia characterized by min delay in swallow iniation, reduced tongue base retraction, and incomplete epiglottic deflection resulting in variable min to mi/mod vallecula and pyriform sinus residue greater with puree and solids. Pt was turned AP and appeared to have greater residuals in left vallecular space than right. Recommend regular textures and thin liquids with use of strategies and trial period of dysphagia therapy to focus on pharyngeal strengthening and Pt education regarding dysphagia and aspiration precautions. Consider neurology work up given pharyngeal dysphagia with onset in the past couple of months. Pt in agreement with plan of care.    Speech Therapy Frequency 1x /week    Duration 4 weeks    Treatment/Interventions Aspiration precaution training;SLP instruction and feedback;Pharyngeal  strengthening exercises;Patient/family education;Compensatory techniques    Potential to Achieve Goals Good    SLP Home Exercise Plan Pt will complete HEP as assigned to facilitate carryover of treatment strategies in home environment    Consulted and Agree with Plan of Care Patient           Patient will benefit from skilled therapeutic intervention in order to improve the following deficits and impairments:   Oropharyngeal dysphagia     Recommendations/Treatment - 10/28/19 1254      Swallow Evaluation Recommendations   Recommended Consults Other (Comment)   consider neuro work up given pharyngeal dysphagia   SLP Diet Recommendations Age appropriate regular;Thin    Liquid Administration via Cup;Straw    Medication Administration Whole meds with liquid  Supervision Patient able to self feed    Compensations Multiple dry swallows after each bite/sip;Follow solids with liquid    Postural Changes Seated upright at 90 degrees;Remain upright for at least 30 minutes after feeds/meals             Problem List Patient Active Problem List   Diagnosis Date Noted  . Dysphagia 10/22/2019  . History of ascites 10/22/2019  . Hypothyroidism 10/16/2016  . Liver cirrhosis secondary to NASH ([place]) 10/16/2016  . Depression 10/16/2016  . Thrombocytopenia ([place]) 10/16/2016  . Spleen enlarged 10/16/2016   Thank you,  Genene Churn, CCC-SLP [phone]  [facility] 10/28/2019, 1:01 PM  [place] [address] [place], [place], [address] Phone: [phone]   Fax:  [phone]  Name: Daniel Crawford MRN: [id number] Date of Birth: Nov 03, 1955

## 2019-11-03 ENCOUNTER — Encounter: Payer: Self-pay | Admitting: Nutrition

## 2019-11-03 NOTE — Patient Instructions (Signed)
Ova Vanvalkenburgh  11/03/2019     @PREFPERIOPPHARMACY @   Your procedure is scheduled on  11/06/2019  Report to [facility] at  [place].M.  Call this number if you have problems the morning of surgery:  [phone]   Remember:  Follow the diet instructions given to you by the office.                    Take these medicines the morning of surgery with A SIP OF WATER  Caredilol, levothyroxine, zoloft.    Do not wear jewelry, make-up or nail polish.  Do not wear lotions, powders, or perfumes. Please wear deodorant and brush your teeth.  Do not shave 48 hours prior to surgery.  Men Dudziak shave face and neck.  Do not bring valuables to the hospital.  [facility] is not responsible for any belongings or valuables.  Contacts, dentures or bridgework Brackins not be worn into surgery.  Leave your suitcase in the car.  After surgery it Brys be brought to your room.  For patients admitted to the hospital, discharge time will be determined by your treatment team.  Patients discharged the day of surgery will not be allowed to drive home.   Name and phone number of your driver:   family Special instructions:  DO NOT smoke the morning of your procedure.  Please read over the following fact sheets that you were given. Anesthesia Post-op Instructions and Care and Recovery After Surgery       Upper Endoscopy, Adult, Care After This sheet gives you information about how to care for yourself after your procedure. Your health care provider Boen also give you more specific instructions. If you have problems or questions, contact your health care provider. What can I expect after the procedure? After the procedure, it is common to have:  A sore throat.  Mild stomach pain or discomfort.  Bloating.  Nausea. Follow these instructions at home:   Follow instructions from your health care provider about what to eat or drink after your procedure.  Return to your normal activities as told by your health  care provider. Ask your health care provider what activities are safe for you.  Take over-the-counter and prescription medicines only as told by your health care provider.  Do not drive for 24 hours if you were given a sedative during your procedure.  Keep all follow-up visits as told by your health care provider. This is important. Contact a health care provider if you have:  A sore throat that lasts longer than one day.  Trouble swallowing. Get help right away if:  You vomit blood or your vomit looks like coffee grounds.  You have: ? A fever. ? Bloody, black, or tarry stools. ? A severe sore throat or you cannot swallow. ? Difficulty breathing. ? Severe pain in your chest or abdomen. Summary  After the procedure, it is common to have a sore throat, mild stomach discomfort, bloating, and nausea.  Do not drive for 24 hours if you were given a sedative during the procedure.  Follow instructions from your health care provider about what to eat or drink after your procedure.  Return to your normal activities as told by your health care provider. This information is not intended to replace advice given to you by your health care provider. Make sure you discuss any questions you have with your health care provider. Document Revised: 09/03/2017 Document Reviewed: 08/12/2017 Elsevier Patient  Education  [employer].  Esophageal Dilatation Esophageal dilatation, also called esophageal dilation, is a procedure to widen or open (dilate) a blocked or narrowed part of the esophagus. The esophagus is the part of the body that moves food and liquid from the mouth to the stomach. You Willert need this procedure if:  You have a buildup of scar tissue in your esophagus that makes it difficult, painful, or impossible to swallow. This can be caused by gastroesophageal reflux disease (GERD).  You have cancer of the esophagus.  There is a problem with how food moves through your esophagus. In  some cases, you Bixler need this procedure repeated at a later time to dilate the esophagus gradually. Tell a health care provider about:  Any allergies you have.  All medicines you are taking, including vitamins, herbs, eye drops, creams, and over-the-counter medicines.  Any problems you or family members have had with anesthetic medicines.  Any blood disorders you have.  Any surgeries you have had.  Any medical conditions you have.  Any antibiotic medicines you are required to take before dental procedures.  Whether you are pregnant or Laflam be pregnant. What are the risks? Generally, this is a safe procedure. However, problems Snooks occur, including:  Bleeding due to a tear in the lining of the esophagus.  A hole (perforation) in the esophagus. What happens before the procedure?  Follow instructions from your health care provider about eating or drinking restrictions.  Ask your health care provider about changing or stopping your regular medicines. This is especially important if you are taking diabetes medicines or blood thinners.  Plan to have someone take you home from the hospital or clinic.  Plan to have a responsible adult care for you for at least 24 hours after you leave the hospital or clinic. This is important. What happens during the procedure?  You Branton be given a medicine to help you relax (sedative).  A numbing medicine Mortellaro be sprayed into the back of your throat, or you Dettmer gargle the medicine.  Your health care provider Hitchner perform the dilatation using various surgical instruments, such as: ? Simple dilators. This instrument is carefully placed in the esophagus to stretch it. ? Guided wire bougies. This involves using an endoscope to insert a wire into the esophagus. A dilator is passed over this wire to enlarge the esophagus. Then the wire is removed. ? Balloon dilators. An endoscope with a small balloon at the end is inserted into the esophagus. The balloon is  inflated to stretch the esophagus and open it up. The procedure Valls vary among health care providers and hospitals. What happens after the procedure?  Your blood pressure, heart rate, breathing rate, and blood oxygen level will be monitored until the medicines you were given have worn off.  Your throat Sproull feel slightly sore and numb. This will improve slowly over time.  You will not be allowed to eat or drink until your throat is no longer numb.  When you are able to drink, urinate, and sit on the edge of the bed without nausea or dizziness, you Capers be able to return home. Follow these instructions at home:  Take over-the-counter and prescription medicines only as told by your health care provider.  Do not drive for 24 hours if you were given a sedative during your procedure.  You should have a responsible adult with you for 24 hours after the procedure.  Follow instructions from your health care provider about any  eating or drinking restrictions.  Do not use any products that contain nicotine or tobacco, such as cigarettes and e-cigarettes. If you need help quitting, ask your health care provider.  Keep all follow-up visits as told by your health care provider. This is important. Get help right away if you:  Have a fever.  Have chest pain.  Have pain that is not relieved by medication.  Have trouble breathing.  Have trouble swallowing.  Vomit blood. Summary  Esophageal dilatation, also called esophageal dilation, is a procedure to widen or open (dilate) a blocked or narrowed part of the esophagus.  Plan to have someone take you home from the hospital or clinic.  For this procedure, a numbing medicine Mitten be sprayed into the back of your throat, or you Brownrigg gargle the medicine.  Do not drive for 24 hours if you were given a sedative during your procedure. This information is not intended to replace advice given to you by your health care provider. Make sure you discuss  any questions you have with your health care provider. Document Revised: 01/07/2019 Document Reviewed: 01/15/2017 Elsevier Patient Education  2020 [place] After These instructions provide you with information about caring for yourself after your procedure. Your health care provider Frieden also give you more specific instructions. Your treatment has been planned according to current medical practices, but problems sometimes occur. Call your health care provider if you have any problems or questions after your procedure. What can I expect after the procedure? After your procedure, you [place]wood:  Feel sleepy for several hours.  Feel clumsy and have poor balance for several hours.  Feel forgetful about what happened after the procedure.  Have poor judgment for several hours.  Feel nauseous or vomit.  Have a sore throat if you had a breathing tube during the procedure. Follow these instructions at home: For at least 24 hours after the procedure:      Have a responsible adult stay with you. It is important to have someone help care for you until you are awake and alert.  Rest as needed.  Do not: ? Participate in activities in which you could fall or become injured. ? Drive. ? Use heavy machinery. ? Drink alcohol. ? Take sleeping pills or medicines that cause drowsiness. ? Make important decisions or sign legal documents. ? Take care of children on your own. Eating and drinking  Follow the diet that is recommended by your health care provider.  If you vomit, drink water, juice, or soup when you can drink without vomiting.  Make sure you have little or no nausea before eating solid foods. General instructions  Take over-the-counter and prescription medicines only as told by your health care provider.  If you have sleep apnea, surgery and certain medicines can increase your risk for breathing problems. Follow instructions from your health care  provider about wearing your sleep device: ? Anytime you are sleeping, including during daytime naps. ? While taking prescription pain medicines, sleeping medicines, or medicines that make you drowsy.  If you smoke, do not smoke without supervision.  Keep all follow-up visits as told by your health care provider. This is important. Contact a health care provider if:  You keep feeling nauseous or you keep vomiting.  You feel light-headed.  You develop a rash.  You have a fever. Get help right away if:  You have trouble breathing. Summary  For several hours after your procedure, you Tosh feel sleepy and  have poor judgment.  Have a responsible adult stay with you for at least 24 hours or until you are awake and alert. This information is not intended to replace advice given to you by your health care provider. Make sure you discuss any questions you have with your health care provider. Document Revised: 06/10/2017 Document Reviewed: 07/03/2015 Elsevier Patient Education  [place].

## 2019-11-04 ENCOUNTER — Other Ambulatory Visit (HOSPITAL_COMMUNITY)
Admission: RE | Admit: 2019-11-04 | Discharge: 2019-11-04 | Disposition: A | Discharge status: Home / Self Care | Payer: Medicare HMO | Source: Ambulatory Visit | Attending: Gastroenterology | Admitting: Gastroenterology

## 2019-11-04 ENCOUNTER — Encounter (HOSPITAL_COMMUNITY)
Admission: RE | Admit: 2019-11-04 | Discharge: 2019-11-04 | Disposition: A | Discharge status: Home / Self Care | Payer: Medicare HMO | Source: Ambulatory Visit | Attending: Gastroenterology | Admitting: Gastroenterology

## 2019-11-04 ENCOUNTER — Encounter (HOSPITAL_COMMUNITY): Payer: Self-pay

## 2019-11-04 ENCOUNTER — Other Ambulatory Visit: Payer: Self-pay

## 2019-11-04 DIAGNOSIS — Z20822 Contact with and (suspected) exposure to covid-19: Secondary | ICD-10-CM | POA: Insufficient documentation

## 2019-11-04 DIAGNOSIS — Z01810 Encounter for preprocedural cardiovascular examination: Secondary | ICD-10-CM | POA: Insufficient documentation

## 2019-11-04 DIAGNOSIS — Z01818 Encounter for other preprocedural examination: Secondary | ICD-10-CM | POA: Diagnosis not present

## 2019-11-05 LAB — SARS CORONAVIRUS 2 (TAT 6-24 HRS): SARS Coronavirus 2: NEGATIVE

## 2019-11-06 ENCOUNTER — Encounter (HOSPITAL_COMMUNITY)
Admission: RE | Disposition: A | Discharge status: Home / Self Care | Payer: Self-pay | Source: Home / Self Care | Attending: Gastroenterology

## 2019-11-06 ENCOUNTER — Encounter (HOSPITAL_COMMUNITY): Payer: Self-pay | Admitting: Gastroenterology

## 2019-11-06 ENCOUNTER — Ambulatory Visit (HOSPITAL_COMMUNITY)
Admission: RE | Admit: 2019-11-06 | Discharge: 2019-11-06 | Disposition: A | Discharge status: Home / Self Care | Payer: Medicare HMO | Attending: Gastroenterology | Admitting: Gastroenterology

## 2019-11-06 ENCOUNTER — Ambulatory Visit (HOSPITAL_COMMUNITY): Payer: Medicare HMO | Admitting: Anesthesiology

## 2019-11-06 DIAGNOSIS — R69 Illness, unspecified: Secondary | ICD-10-CM | POA: Diagnosis not present

## 2019-11-06 DIAGNOSIS — K3189 Other diseases of stomach and duodenum: Secondary | ICD-10-CM | POA: Insufficient documentation

## 2019-11-06 DIAGNOSIS — R1310 Dysphagia, unspecified: Secondary | ICD-10-CM | POA: Diagnosis not present

## 2019-11-06 DIAGNOSIS — F329 Major depressive disorder, single episode, unspecified: Secondary | ICD-10-CM | POA: Diagnosis not present

## 2019-11-06 DIAGNOSIS — Z79890 Hormone replacement therapy (postmenopausal): Secondary | ICD-10-CM | POA: Insufficient documentation

## 2019-11-06 DIAGNOSIS — K766 Portal hypertension: Secondary | ICD-10-CM | POA: Diagnosis not present

## 2019-11-06 DIAGNOSIS — Z79899 Other long term (current) drug therapy: Secondary | ICD-10-CM | POA: Diagnosis not present

## 2019-11-06 DIAGNOSIS — D696 Thrombocytopenia, unspecified: Secondary | ICD-10-CM | POA: Insufficient documentation

## 2019-11-06 DIAGNOSIS — E039 Hypothyroidism, unspecified: Secondary | ICD-10-CM | POA: Diagnosis not present

## 2019-11-06 HISTORY — PX: ESOPHAGOGASTRODUODENOSCOPY (EGD) WITH PROPOFOL: SHX5813

## 2019-11-06 HISTORY — PX: ESOPHAGEAL DILATION: SHX303

## 2019-11-06 SURGERY — ESOPHAGOGASTRODUODENOSCOPY (EGD) WITH PROPOFOL
Anesthesia: General

## 2019-11-06 MED ORDER — PROPOFOL 10 MG/ML IV BOLUS
INTRAVENOUS | Status: DC | PRN
  Administered 2019-11-06: 40 mg via INTRAVENOUS
  Administered 2019-11-06 (×2): 60 mg via INTRAVENOUS
  Administered 2019-11-06: 100 mg via INTRAVENOUS
  Administered 2019-11-06: 60 mg via INTRAVENOUS
  Administered 2019-11-06: 40 mg via INTRAVENOUS
  Administered 2019-11-06: 120 mg via INTRAVENOUS

## 2019-11-06 MED ORDER — LACTATED RINGERS IV SOLN: INTRAVENOUS | Status: DC

## 2019-11-06 MED ORDER — LIDOCAINE VISCOUS HCL 2 % MT SOLN
15.0000 mL | Freq: Once | OROMUCOSAL | Status: AC
  Administered 2019-11-06: 15 mL via OROMUCOSAL

## 2019-11-06 MED ORDER — STERILE WATER FOR IRRIGATION IR SOLN
Status: DC | PRN
  Administered 2019-11-06: 1.5 mL

## 2019-11-06 MED ORDER — GLYCOPYRROLATE 0.2 MG/ML IJ SOLN
INTRAMUSCULAR | Status: AC
  Filled 2019-11-06: qty 1

## 2019-11-06 MED ORDER — LIDOCAINE VISCOUS HCL 2 % MT SOLN
OROMUCOSAL | Status: AC
  Filled 2019-11-06: qty 15

## 2019-11-06 MED ORDER — GLYCOPYRROLATE 0.2 MG/ML IJ SOLN
0.2000 mg | Freq: Once | INTRAMUSCULAR | Status: AC
  Administered 2019-11-06: 0.2 mg via INTRAVENOUS

## 2019-11-06 NOTE — Anesthesia Postprocedure Evaluation (Signed)
Anesthesia Post Note  Patient: Daniel Crawford  Procedure(s) Performed: ESOPHAGOGASTRODUODENOSCOPY (EGD) WITH PROPOFOL (N/A ) ESOPHAGEAL DILATION (N/A )  Patient location during evaluation: PACU Anesthesia Type: General Level of consciousness: awake and alert and patient cooperative Pain management: satisfactory to patient Vital Signs Assessment: post-procedure vital signs reviewed and stable Respiratory status: spontaneous breathing Cardiovascular status: stable Postop Assessment: no apparent nausea or vomiting Anesthetic complications: no   No complications documented.   Last Vitals:  Vitals:   11/06/19 1030 11/06/19 1245  BP: 139/80 (!) 108/52  Pulse: 68 83  Resp: 18 12  Temp: 36.5 C 36.8 C  SpO2: 96% 96%    Last Pain:  Vitals:   11/06/19 1245  PainSc: 0-No pain                 Luisana Lutzke

## 2019-11-06 NOTE — Op Note (Signed)
[facility] Patient Name: Daniel Crawford Procedure Date: 11/06/2019 12:08 PM MRN: [id number] Date of Birth: 04/07/1955 Attending MD: [name] ,  CSN: [id number] Age: 64 Admit Type: Outpatient Procedure:                Upper GI endoscopy Indications:              Dysphagia Providers:                [name], Rosina Lowenstein, RN, Caprice Kluver,                            Thomas Hoff., Technician Referring MD:              Medicines:                Monitored Anesthesia Care Complications:            No immediate complications. Estimated Blood Loss:     Estimated blood loss: none. Procedure:                Pre-Anesthesia Assessment:                           - Prior to the procedure, a History and Physical                            was performed, and patient medications, allergies                            and sensitivities were reviewed. The patient's                            tolerance of previous anesthesia was reviewed.                           - The risks and benefits of the procedure and the                            sedation options and risks were discussed with the                            patient. All questions were answered and informed                            consent was obtained.                           - ASA Grade Assessment: II - A patient with mild                            systemic disease.                           After obtaining informed consent, the endoscope was                            passed under direct vision. Throughout the  procedure, the patient's blood pressure, pulse, and                            oxygen saturations were monitored continuously. The                            [id number]-H190 ([id number]) scope was introduced through the                            mouth, and advanced to the second part of duodenum.                            The upper GI endoscopy was accomplished without                             difficulty. The patient tolerated the procedure                            well. Scope In: 12:18:33 PM Scope Out: 12:37:26 PM Total Procedure Duration: 0 hours 18 minutes 53 seconds  Findings:      The examined esophagus was normal. A guidewire was placed and the scope       was withdrawn. An empiric dilation was performed with a Savary dilator       with no resistance at 17 mm. Upon reinspection, there was no mucosal       breaks or hematin.      Moderate portal hypertensive gastropathy was found in the gastric fundus       and in the gastric body.      The examined duodenum was normal. Impression:               - Normal esophagus. Dilated.                           - Portal hypertensive gastropathy.                           - Normal examined duodenum.                           - No specimens collected. Moderate Sedation:      Per Anesthesia Care Recommendation:           - Discharge patient to home (ambulatory).                           - Resume previous diet.                           - Follow speech and swallow recommendations for                            choking episodes. Procedure Code(s):        --- Professional ---                           [phone], GC, Esophagogastroduodenoscopy, flexible,  transoral; with insertion of guide wire followed by                            passage of dilator(s) through esophagus over guide                            wire Diagnosis Code(s):        --- Professional ---                           K76.6, Portal hypertension                           K31.89, Other diseases of stomach and duodenum                           R13.10, Dysphagia, unspecified CPT copyright 2019 American Medical Association. All rights reserved. The codes documented in this report are preliminary and upon coder review [place]  be revised to meet current compliance requirements. [name], MD [name],  11/06/2019 12:43:34 PM This  report has been signed electronically. Number of Addenda: 0

## 2019-11-06 NOTE — Transfer of Care (Signed)
Immediate Anesthesia Transfer of Care Note  Patient: Daniel Crawford  Procedure(s) Performed: ESOPHAGOGASTRODUODENOSCOPY (EGD) WITH PROPOFOL (N/A ) ESOPHAGEAL DILATION (N/A )  Patient Location: PACU  Anesthesia Type:General  Level of Consciousness: awake and patient cooperative  Airway & Oxygen Therapy: Patient Spontanous Breathing and Patient connected to nasal cannula oxygen  Post-op Assessment: Report given to RN and Post -op Vital signs reviewed and stable  Post vital signs: Reviewed and stable  Last Vitals:  Vitals Value Taken Time  BP    Temp 98.3   Pulse 83 11/06/19 1245  Resp 12 11/06/19 1245  SpO2 96 % 11/06/19 1245  Vitals shown include unvalidated device data.  Last Pain:  Vitals:   11/06/19 1214  PainSc: 0-No pain         Complications: No complications documented.

## 2019-11-06 NOTE — Anesthesia Preprocedure Evaluation (Signed)
Anesthesia Evaluation  Patient identified by MRN, date of birth, ID band Patient awake    Reviewed: Allergy & Precautions, H&P , NPO status , Patient's Chart, lab work & pertinent test results, reviewed documented beta blocker date and time   Airway Mallampati: III  TM Distance: >3 FB Neck ROM: full    Dental no notable dental hx.    Pulmonary neg pulmonary ROS,    Pulmonary exam normal breath sounds clear to auscultation       Cardiovascular Exercise Tolerance: Good negative cardio ROS   Rhythm:regular Rate:Normal     Neuro/Psych PSYCHIATRIC DISORDERS Depression negative neurological ROS     GI/Hepatic negative GI ROS, (+) Hepatitis -  Endo/Other  Hypothyroidism Morbid obesity  Renal/GU negative Renal ROS  negative genitourinary   Musculoskeletal   Abdominal   Peds  Hematology negative hematology ROS (+)   Anesthesia Other Findings   Reproductive/Obstetrics negative OB ROS                             Anesthesia Physical Anesthesia Plan  ASA: III  Anesthesia Plan: General   Post-op Pain Management:    Induction:   PONV Risk Score and Plan: Propofol infusion  Airway Management Planned:   Additional Equipment:   Intra-op Plan:   Post-operative Plan:   Informed Consent: I have reviewed the patients History and Physical, chart, labs and discussed the procedure including the risks, benefits and alternatives for the proposed anesthesia with the patient or authorized representative who has indicated his/her understanding and acceptance.     Dental Advisory Given  Plan Discussed with: CRNA  Anesthesia Plan Comments:         Anesthesia Quick Evaluation

## 2019-11-06 NOTE — Anesthesia Procedure Notes (Signed)
Date/Time: 11/06/2019 12:13 PM Performed by: Vista Deck, CRNA Pre-anesthesia Checklist: Patient identified, Emergency Drugs available, Suction available, Timeout performed and Patient being monitored Patient Re-evaluated:Patient Re-evaluated prior to induction Oxygen Delivery Method: Non-rebreather mask

## 2019-11-06 NOTE — Discharge Instructions (Signed)
You are being discharged to home.  Resume your previous diet.  Follow speech and swallow recommendations for choking episodes.     Esophageal Dilatation Esophageal dilatation, also called esophageal dilation, is a procedure to widen or open (dilate) a blocked or narrowed part of the esophagus. The esophagus is the part of the body that moves food and liquid from the mouth to the stomach. You Law need this procedure if:  You have a buildup of scar tissue in your esophagus that makes it difficult, painful, or impossible to swallow. This can be caused by gastroesophageal reflux disease (GERD).  You have cancer of the esophagus.  There is a problem with how food moves through your esophagus. In some cases, you Privott need this procedure repeated at a later time to dilate the esophagus gradually. Tell a health care provider about:  Any allergies you have.  All medicines you are taking, including vitamins, herbs, eye drops, creams, and over-the-counter medicines.  Any problems you or family members have had with anesthetic medicines.  Any blood disorders you have.  Any surgeries you have had.  Any medical conditions you have.  Any antibiotic medicines you are required to take before dental procedures.  Whether you are pregnant or Plemmons be pregnant. What are the risks? Generally, this is a safe procedure. However, problems Hanford occur, including:  Bleeding due to a tear in the lining of the esophagus.  A hole (perforation) in the esophagus. What happens before the procedure?  Follow instructions from your health care provider about eating or drinking restrictions.  Ask your health care provider about changing or stopping your regular medicines. This is especially important if you are taking diabetes medicines or blood thinners.  Plan to have someone take you home from the hospital or clinic.  Plan to have a responsible adult care for you for at least 24 hours after you leave the  hospital or clinic. This is important. What happens during the procedure?  You Youman be given a medicine to help you relax (sedative).  A numbing medicine Kobus be sprayed into the back of your throat, or you Saxer gargle the medicine.  Your health care provider Whittlesey perform the dilatation using various surgical instruments, such as: ? Simple dilators. This instrument is carefully placed in the esophagus to stretch it. ? Guided wire bougies. This involves using an endoscope to insert a wire into the esophagus. A dilator is passed over this wire to enlarge the esophagus. Then the wire is removed. ? Balloon dilators. An endoscope with a small balloon at the end is inserted into the esophagus. The balloon is inflated to stretch the esophagus and open it up. The procedure Kahrs vary among health care providers and hospitals. What happens after the procedure?  Your blood pressure, heart rate, breathing rate, and blood oxygen level will be monitored until the medicines you were given have worn off.  Your throat Lagrand feel slightly sore and numb. This will improve slowly over time.  You will not be allowed to eat or drink until your throat is no longer numb.  When you are able to drink, urinate, and sit on the edge of the bed without nausea or dizziness, you Bouwman be able to return home. Follow these instructions at home:  Take over-the-counter and prescription medicines only as told by your health care provider.  Do not drive for 24 hours if you were given a sedative during your procedure.  You should have a responsible adult with  you for 24 hours after the procedure.  Follow instructions from your health care provider about any eating or drinking restrictions.  Do not use any products that contain nicotine or tobacco, such as cigarettes and e-cigarettes. If you need help quitting, ask your health care provider.  Keep all follow-up visits as told by your health care provider. This is important. Get  help right away if you:  Have a fever.  Have chest pain.  Have pain that is not relieved by medication.  Have trouble breathing.  Have trouble swallowing.  Vomit blood. Summary  Esophageal dilatation, also called esophageal dilation, is a procedure to widen or open (dilate) a blocked or narrowed part of the esophagus.  Plan to have someone take you home from the hospital or clinic.  For this procedure, a numbing medicine Bi be sprayed into the back of your throat, or you Latona gargle the medicine.  Do not drive for 24 hours if you were given a sedative during your procedure. This information is not intended to replace advice given to you by your health care provider. Make sure you discuss any questions you have with your health care provider. Document Revised: 01/07/2019 Document Reviewed: 01/15/2017 Elsevier Patient Education  2020 [place] After These instructions provide you with information about caring for yourself after your procedure. Your health care provider House also give you more specific instructions. Your treatment has been planned according to current medical practices, but problems sometimes occur. Call your health care provider if you have any problems or questions after your procedure. What can I expect after the procedure? After your procedure, you Knechtel:  Feel sleepy for several hours.  Feel clumsy and have poor balance for several hours.  Feel forgetful about what happened after the procedure.  Have poor judgment for several hours.  Feel nauseous or vomit.  Have a sore throat if you had a breathing tube during the procedure. Follow these instructions at home: For at least 24 hours after the procedure:      Have a responsible adult stay with you. It is important to have someone help care for you until you are awake and alert.  Rest as needed.  Do not: ? Participate in activities in which you could fall or  become injured. ? Drive. ? Use heavy machinery. ? Drink alcohol. ? Take sleeping pills or medicines that cause drowsiness. ? Make important decisions or sign legal documents. ? Take care of children on your own. Eating and drinking  Follow the diet that is recommended by your health care provider.  If you vomit, drink water, juice, or soup when you can drink without vomiting.  Make sure you have little or no nausea before eating solid foods. General instructions  Take over-the-counter and prescription medicines only as told by your health care provider.  If you have sleep apnea, surgery and certain medicines can increase your risk for breathing problems. Follow instructions from your health care provider about wearing your sleep device: ? Anytime you are sleeping, including during daytime naps. ? While taking prescription pain medicines, sleeping medicines, or medicines that make you drowsy.  If you smoke, do not smoke without supervision.  Keep all follow-up visits as told by your health care provider. This is important. Contact a health care provider if:  You keep feeling nauseous or you keep vomiting.  You feel light-headed.  You develop a rash.  You have a fever. Get help right away if:  You have trouble breathing. Summary  For several hours after your procedure, you Zarcone feel sleepy and have poor judgment.  Have a responsible adult stay with you for at least 24 hours or until you are awake and alert. This information is not intended to replace advice given to you by your health care provider. Make sure you discuss any questions you have with your health care provider. Document Revised: 06/10/2017 Document Reviewed: 07/03/2015 Elsevier Patient Education  [place].

## 2019-11-06 NOTE — H&P (Signed)
Daniel Crawford is an 64 y.o. male.   Chief Complaint: Dysphagia HPI: 64 year old male with past medical history of NASH cirrhosis, hypothyroidism, and depression, who comes to the hospital today to undergo EGD for evaluation of dysphagia.  The patient reports that for the last 8 weeks he has noticed dysphagia to both solids and liquids as well as significant choking spells.  Denies any odynophagia or episode of food impaction.  Last EGD was performed in 2017 found to have mild gastritis but no other alterations. no family history of gastrointestinal malignancy..  Past Medical History:  Diagnosis Date  . Cirrhosis ([place]) 10/16/2016  . Depression 10/16/2016  . Hypothyroidism 10/16/2016  . Spleen enlarged 10/16/2016  . Thrombocytopenia ([place]) 10/16/2016    Past Surgical History:  Procedure Laterality Date  . Bilateral hernia surgery     2006, 2007  . CHOLECYSTECTOMY     2016   . Polyp removed     in January of 2018.     History reviewed. No pertinent family history. Social History:  reports that he has never smoked. He has never used smokeless tobacco. He reports that he does not drink alcohol and does not use drugs.  Allergies: No Known Allergies  Medications Prior to Admission  Medication Sig Dispense Refill  . Acetaminophen 500 MG capsule Take 500 mg by mouth every 6 (six) hours as needed for pain.     . carvedilol (COREG) 12.5 MG tablet Take 12.5 mg by mouth 2 (two) times daily with a meal.    . cholecalciferol (VITAMIN D3) 25 MCG (1000 UNIT) tablet Take 1,000 Units by mouth daily.    . furosemide (LASIX) 20 MG tablet Take 60 mg by mouth daily.     . hydrOXYzine (VISTARIL) 50 MG capsule Take 50 mg by mouth in the morning and at bedtime.    Marland Kitchen lactulose (CHRONULAC) 10 GM/15ML solution Take 15 mLs (10 g total) by mouth 3 (three) times daily. (Patient taking differently: Take 10 g by mouth 2 (two) times daily. ) 480 mL 4  . levothyroxine (SYNTHROID, LEVOTHROID) 200 MCG tablet Take 200 mcg by mouth  daily before breakfast.    . sertraline (ZOLOFT) 25 MG tablet Take 25 mg by mouth daily.    . traZODone (DESYREL) 50 MG tablet Take 50 mg by mouth at bedtime.      Results for orders placed or performed during the hospital encounter of 11/04/19 (from the past 48 hour(s))  SARS CORONAVIRUS 2 (TAT 6-24 HRS) Nasopharyngeal Nasopharyngeal Swab     Status: None   Collection Time: 11/04/19 11:47 AM   Specimen: Nasopharyngeal Swab  Result Value Ref Range   SARS Coronavirus 2 NEGATIVE NEGATIVE    Comment: (NOTE) SARS-CoV-2 target nucleic acids are NOT DETECTED.  The SARS-CoV-2 RNA is generally detectable in upper and lower respiratory specimens during the acute phase of infection. Negative results do not preclude SARS-CoV-2 infection, do not rule out co-infections with other pathogens, and should not be used as the sole basis for treatment or other patient management decisions. Negative results must be combined with clinical observations, patient history, and epidemiological information. The expected result is Negative.  Fact Sheet for Patients: SugarRoll.be  Fact Sheet for Healthcare Providers: https://www.woods-mathews.com/  This test is not yet approved or cleared by the [place] FDA and  has been authorized for detection and/or diagnosis of SARS-CoV-2 by FDA under an Emergency Use Authorization (EUA). This EUA will remain  in effect (meaning this test can be used)  for the duration of the COVID-19 declaration under Se ction 564(b)(1) of the Act, 21 U.S.C. section 360bbb-3(b)(1), unless the authorization is terminated or revoked sooner.  Performed at [facility], [place] [address], [place], [place] [address]    No results found.  Review of Systems  Constitutional: Negative.   HENT: Positive for trouble swallowing.   Eyes: Negative.   Respiratory: Negative.   Cardiovascular: Negative.   Gastrointestinal: Negative.    Endocrine: Negative.   Genitourinary: Negative.   Musculoskeletal: Negative.   Skin: Negative.   Allergic/Immunologic: Negative.   Neurological: Negative.   Hematological: Negative.   Psychiatric/Behavioral: Negative.     Blood pressure 139/80, pulse 68, temperature 97.7 F (36.5 C), resp. rate 18, SpO2 96 %. Physical Exam  GENERAL: The patient is AO x3, in no acute distress. Obese. HEENT: Head is normocephalic and atraumatic. EOMI are intact. Mouth is well hydrated and without lesions. NECK: Supple. No masses LUNGS: Clear to auscultation. No presence of rhonchi/wheezing/rales. Adequate chest expansion HEART: RRR, normal s1 and s2. ABDOMEN: Soft, nontender, no guarding, no peritoneal signs, and nondistended. BS +. No masses. EXTREMITIES: Without any cyanosis, clubbing, rash, lesions or edema. NEUROLOGIC: AOx3, no focal motor deficit. SKIN: no jaundice, no rashes  Assessment/Plan 64 year old male with past medical history of NASH cirrhosis, hypothyroidism, and depression, who comes for evaluation of dysphagia.  She had recent modified barium swallow which showed some oropharyngeal dysphagia.  Will undergo EGD today.  Harvel Quale, MD 11/06/2019, 11:32 AM

## 2019-11-10 ENCOUNTER — Encounter (HOSPITAL_COMMUNITY): Payer: Self-pay | Admitting: Gastroenterology

## 2019-11-10 DIAGNOSIS — E039 Hypothyroidism, unspecified: Secondary | ICD-10-CM | POA: Diagnosis not present

## 2019-11-10 DIAGNOSIS — R739 Hyperglycemia, unspecified: Secondary | ICD-10-CM | POA: Diagnosis not present

## 2019-11-10 DIAGNOSIS — K7290 Hepatic failure, unspecified without coma: Secondary | ICD-10-CM | POA: Diagnosis not present

## 2019-11-10 DIAGNOSIS — Z79899 Other long term (current) drug therapy: Secondary | ICD-10-CM | POA: Diagnosis not present

## 2019-11-10 DIAGNOSIS — K7460 Unspecified cirrhosis of liver: Secondary | ICD-10-CM | POA: Diagnosis not present

## 2019-11-10 DIAGNOSIS — R69 Illness, unspecified: Secondary | ICD-10-CM | POA: Diagnosis not present

## 2019-11-10 DIAGNOSIS — R609 Edema, unspecified: Secondary | ICD-10-CM | POA: Diagnosis not present

## 2019-11-24 DIAGNOSIS — K7460 Unspecified cirrhosis of liver: Secondary | ICD-10-CM | POA: Diagnosis not present

## 2019-11-24 DIAGNOSIS — R609 Edema, unspecified: Secondary | ICD-10-CM | POA: Diagnosis not present

## 2019-11-24 DIAGNOSIS — R0602 Shortness of breath: Secondary | ICD-10-CM | POA: Diagnosis not present

## 2019-11-24 DIAGNOSIS — R140 Abdominal distension (gaseous): Secondary | ICD-10-CM | POA: Diagnosis not present

## 2019-11-26 ENCOUNTER — Ambulatory Visit (HOSPITAL_COMMUNITY): Payer: Medicare HMO | Admitting: Speech Pathology

## 2019-12-01 DIAGNOSIS — Z9049 Acquired absence of other specified parts of digestive tract: Secondary | ICD-10-CM | POA: Diagnosis not present

## 2019-12-01 DIAGNOSIS — K7460 Unspecified cirrhosis of liver: Secondary | ICD-10-CM | POA: Diagnosis not present

## 2019-12-01 DIAGNOSIS — K7689 Other specified diseases of liver: Secondary | ICD-10-CM | POA: Diagnosis not present

## 2019-12-01 DIAGNOSIS — R932 Abnormal findings on diagnostic imaging of liver and biliary tract: Secondary | ICD-10-CM | POA: Diagnosis not present

## 2019-12-01 DIAGNOSIS — R140 Abdominal distension (gaseous): Secondary | ICD-10-CM | POA: Diagnosis not present

## 2019-12-01 DIAGNOSIS — R161 Splenomegaly, not elsewhere classified: Secondary | ICD-10-CM | POA: Diagnosis not present

## 2019-12-02 ENCOUNTER — Ambulatory Visit: Payer: Medicare HMO | Admitting: Nutrition

## 2019-12-16 ENCOUNTER — Ambulatory Visit: Payer: Medicare HMO | Admitting: Nutrition

## 2020-01-12 DIAGNOSIS — Z86010 Personal history of colonic polyps: Secondary | ICD-10-CM | POA: Diagnosis not present

## 2020-01-12 DIAGNOSIS — E039 Hypothyroidism, unspecified: Secondary | ICD-10-CM | POA: Diagnosis not present

## 2020-01-12 DIAGNOSIS — E669 Obesity, unspecified: Secondary | ICD-10-CM | POA: Diagnosis not present

## 2020-01-12 DIAGNOSIS — R609 Edema, unspecified: Secondary | ICD-10-CM | POA: Diagnosis not present

## 2020-01-12 DIAGNOSIS — Z79899 Other long term (current) drug therapy: Secondary | ICD-10-CM | POA: Diagnosis not present

## 2020-01-12 DIAGNOSIS — K7460 Unspecified cirrhosis of liver: Secondary | ICD-10-CM | POA: Diagnosis not present

## 2020-01-12 DIAGNOSIS — R739 Hyperglycemia, unspecified: Secondary | ICD-10-CM | POA: Diagnosis not present

## 2020-01-12 DIAGNOSIS — R0602 Shortness of breath: Secondary | ICD-10-CM | POA: Diagnosis not present

## 2020-01-12 DIAGNOSIS — Z23 Encounter for immunization: Secondary | ICD-10-CM | POA: Diagnosis not present

## 2020-01-20 ENCOUNTER — Encounter: Payer: Self-pay | Admitting: Pulmonary Disease

## 2020-01-20 ENCOUNTER — Other Ambulatory Visit: Payer: Self-pay

## 2020-01-20 ENCOUNTER — Ambulatory Visit (HOSPITAL_COMMUNITY)
Admission: RE | Admit: 2020-01-20 | Discharge: 2020-01-20 | Disposition: A | Discharge status: Home / Self Care | Payer: Medicare HMO | Source: Ambulatory Visit | Attending: Pulmonary Disease | Admitting: Pulmonary Disease

## 2020-01-20 ENCOUNTER — Ambulatory Visit: Payer: Medicare HMO | Admitting: Pulmonary Disease

## 2020-01-20 VITALS — BP 122/70 | HR 77 | Temp 97.3°F | Ht 74.0 in | Wt 363.0 lb

## 2020-01-20 DIAGNOSIS — R0600 Dyspnea, unspecified: Secondary | ICD-10-CM

## 2020-01-20 DIAGNOSIS — G4733 Obstructive sleep apnea (adult) (pediatric): Secondary | ICD-10-CM | POA: Diagnosis not present

## 2020-01-20 DIAGNOSIS — R0609 Other forms of dyspnea: Secondary | ICD-10-CM

## 2020-01-20 NOTE — Assessment & Plan Note (Signed)
Given excessive daytime somnolence, narrow pharyngeal exam & loud snoring, obstructive sleep apnea is very likely & an overnight polysomnogram will be scheduled as a home study. The pathophysiology of obstructive sleep apnea , it's cardiovascular consequences & modes of treatment including CPAP were discused with the patient in detail & they evidenced understanding.  Pretest probability is intermediate.  No bed partner history is available today.  He states that he was tested in the remote past and this was negative but he has gained significant weight since then.  This Treat account for his nonrefreshing sleep.  I have also asked him to take his diuretic earlier in the day so he does not have multiple nocturnal awakenings due to nocturia

## 2020-01-20 NOTE — Patient Instructions (Addendum)
CXR today Schedule pFTs   Schedule HST

## 2020-01-20 NOTE — Progress Notes (Signed)
Subjective:    Patient ID: Daniel Crawford, male    DOB: 1956-02-09, 64 y.o.   MRN: [id number]  HPI  Chief Complaint  Patient presents with  . Consult    Patient has shortness of breath with exertion, states he retains a lot of fluid all the time, Tired and weak, dry cough    64 year old ex-smoker presents for evaluation of dyspnea on exertion which has been ongoing for the past 2 years. He reports NYHA class II-III dyspnea now with worsening on daily activity, also worsens when he is upset or anxious.  He reports a lot of problems with fluid retention, diuretics have just been increased metolazone has been added to Lasix.  He is also maintained on Zoloft and hydroxyzine for anxiety.  He has had difficulty losing weight and more recently has joined weight watchers He denies wheezing or frequent chest colds. He smoked about 20 pack years starting at age 82 about a pack per day until he quit in 2017.  Epworth sleepiness score is 8 and reports excessive daytime fatigue. Bedtime is between 9 and 11 PM, he wakes up every hour due to nocturia and is out of bed at 7 AM feeling tired with dryness of mouth but denies headaches.  No bed partner sleep history is available regarding snoring or witnessed apneas  PMH - NASH cirrhosis, hypothyroidism, and depression  Labs 09/2019 hemoglobin 13.7, platelets 84, albumin 3.9, INR 1.1, TSH normal, BUN/creatinine 12/1.1  Gives a family history of lung cancer in his mother and his father  Past Medical History:  Diagnosis Date  . Cirrhosis ([place]) 10/16/2016  . Depression 10/16/2016  . Hypothyroidism 10/16/2016  . Spleen enlarged 10/16/2016  . Thrombocytopenia ([place]) 10/16/2016     Past Surgical History:  Procedure Laterality Date  . Bilateral hernia surgery     2006, 2007  . CHOLECYSTECTOMY     2016   . ESOPHAGEAL DILATION N/A 11/06/2019   Procedure: ESOPHAGEAL DILATION;  Surgeon: Harvel Quale, MD;  [facility];  Service:  Gastroenterology;  Laterality: N/A;  . ESOPHAGOGASTRODUODENOSCOPY (EGD) WITH PROPOFOL N/A 11/06/2019   Procedure: ESOPHAGOGASTRODUODENOSCOPY (EGD) WITH PROPOFOL;  Surgeon: Harvel Quale, MD;  Location: [facility];  Service: Gastroenterology;  Laterality: N/A;  1045  . Polyp removed     in January of 2018.     No Known Allergies  Social History   Socioeconomic History  . Marital status: Single    Spouse name: Not on file  . Number of children: Not on file  . Years of education: Not on file  . Highest education level: Not on file  Occupational History  . Not on file  Tobacco Use  . Smoking status: Former Smoker    Packs/day: 1.00    Types: Cigarettes    Quit date: 2017    Years since quitting: 4.8  . Smokeless tobacco: Never Used  Vaping Use  . Vaping Use: Never used  Substance and Sexual Activity  . Alcohol use: No  . Drug use: No  . Sexual activity: Not Currently  Other Topics Concern  . Not on file  Social History Narrative  . Not on file   Social Determinants of Health   Financial Resource Strain:   . Difficulty of Paying Living Expenses: Not on file  Food Insecurity:   . Worried About Charity fundraiser in the Last Year: Not on file  . Ran Out of Food in the Last Year: Not on file  Transportation Needs:   . Film/video editor (Medical): Not on file  . Lack of Transportation (Non-Medical): Not on file  Physical Activity:   . Days of Exercise per Week: Not on file  . Minutes of Exercise per Session: Not on file  Stress:   . Feeling of Stress : Not on file  Social Connections:   . Frequency of Communication with Friends and Family: Not on file  . Frequency of Social Gatherings with Friends and Family: Not on file  . Attends Religious Services: Not on file  . Active Member of Clubs or Organizations: Not on file  . Attends Archivist Meetings: Not on file  . Marital Status: Not on file  Intimate Partner Violence:   . Fear of  Current or Ex-Partner: Not on file  . Emotionally Abused: Not on file  . Physically Abused: Not on file  . Sexually Abused: Not on file     History reviewed. No pertinent family history.  Mom & dad died of lung cancer  Review of Systems Shortness of breath with activity and at rest Nonpleuritic chest pain, nonexertional Weight gain Abdominal pain Difficulty swallowing Anxiety and depression Feet swelling Joint stiffness    Objective:   Physical Exam  Gen. Pleasant, obese, in no distress, normal affect ENT - no pallor,icterus, no post nasal drip, class 2-3 airway Neck: No JVD, no thyromegaly, no carotid bruits Lungs: no use of accessory muscles, no dullness to percussion, decreased without rales or rhonchi  Cardiovascular: Rhythm regular, heart sounds  normal, no murmurs or gallops, ++ peripheral edema Abdomen: soft and non-tender, no hepatosplenomegaly, BS normal. Musculoskeletal: No deformities, no cyanosis or clubbing Neuro:  alert, non focal, no tremors        Assessment & Plan:

## 2020-01-20 NOTE — Assessment & Plan Note (Addendum)
He has significant dyspnea on exertion.  This Toman simply be related to his weight gain and fluid issues. We will obtain chest x-ray to rule out pleural effusions although he does not seem to have any on exam. We will obtain PFTs to look for airway obstruction given his history of smoking -if this is present, he Sammons benefit from bronchodilators.  If above work-up is negative, we can consider echo to rule out pulmonary hypertension Due to his history of smoking and family history of lung cancer, we will make referral for lung cancer screening

## 2020-01-21 ENCOUNTER — Other Ambulatory Visit: Payer: Self-pay

## 2020-01-21 DIAGNOSIS — R9389 Abnormal findings on diagnostic imaging of other specified body structures: Secondary | ICD-10-CM

## 2020-01-21 NOTE — Addendum Note (Signed)
Addended by: Lia Foyer R on: 01/21/2020 10:39 AM   Modules accepted: Orders

## 2020-02-03 ENCOUNTER — Other Ambulatory Visit: Payer: Self-pay

## 2020-02-03 ENCOUNTER — Ambulatory Visit (HOSPITAL_COMMUNITY)
Admission: RE | Admit: 2020-02-03 | Discharge: 2020-02-03 | Disposition: A | Discharge status: Home / Self Care | Payer: Medicare HMO | Source: Ambulatory Visit | Attending: Pulmonary Disease | Admitting: Pulmonary Disease

## 2020-02-03 DIAGNOSIS — I700 Atherosclerosis of aorta: Secondary | ICD-10-CM | POA: Diagnosis not present

## 2020-02-03 DIAGNOSIS — N62 Hypertrophy of breast: Secondary | ICD-10-CM | POA: Diagnosis not present

## 2020-02-03 DIAGNOSIS — I10 Essential (primary) hypertension: Secondary | ICD-10-CM | POA: Diagnosis not present

## 2020-02-03 DIAGNOSIS — R9389 Abnormal findings on diagnostic imaging of other specified body structures: Secondary | ICD-10-CM | POA: Diagnosis not present

## 2020-02-03 DIAGNOSIS — K766 Portal hypertension: Secondary | ICD-10-CM | POA: Diagnosis not present

## 2020-02-04 ENCOUNTER — Telehealth: Payer: Self-pay | Admitting: Pulmonary Disease

## 2020-02-04 NOTE — Telephone Encounter (Signed)
Pt requesting to know the results of CT which he recently had done yesterday 11/10. Dr. Elsworth Soho, please advise.

## 2020-02-05 NOTE — Telephone Encounter (Signed)
No evidence of nodule -Lungs appear clear Chest x-ray appearance was likely false

## 2020-02-05 NOTE — Telephone Encounter (Signed)
Called and provided chest CT results per Dr. Elsworth Soho.  Left a detailed message per DPR with results.  Advised to call with any questions.

## 2020-02-06 NOTE — Telephone Encounter (Signed)
Will close encounter, as detailed message was left.

## 2020-02-25 ENCOUNTER — Telehealth: Payer: Self-pay | Admitting: Pulmonary Disease

## 2020-02-25 NOTE — Telephone Encounter (Signed)
ok 

## 2020-03-15 DIAGNOSIS — K7460 Unspecified cirrhosis of liver: Secondary | ICD-10-CM | POA: Diagnosis not present

## 2020-03-15 DIAGNOSIS — E039 Hypothyroidism, unspecified: Secondary | ICD-10-CM | POA: Diagnosis not present

## 2020-03-15 DIAGNOSIS — R739 Hyperglycemia, unspecified: Secondary | ICD-10-CM | POA: Diagnosis not present

## 2020-03-15 DIAGNOSIS — Z79899 Other long term (current) drug therapy: Secondary | ICD-10-CM | POA: Diagnosis not present

## 2020-03-15 DIAGNOSIS — R609 Edema, unspecified: Secondary | ICD-10-CM | POA: Diagnosis not present

## 2020-03-22 ENCOUNTER — Other Ambulatory Visit (HOSPITAL_COMMUNITY): Payer: Self-pay | Admitting: Physician Assistant

## 2020-03-22 DIAGNOSIS — K219 Gastro-esophageal reflux disease without esophagitis: Secondary | ICD-10-CM | POA: Diagnosis not present

## 2020-03-22 DIAGNOSIS — K7460 Unspecified cirrhosis of liver: Secondary | ICD-10-CM

## 2020-03-22 DIAGNOSIS — R140 Abdominal distension (gaseous): Secondary | ICD-10-CM | POA: Diagnosis not present

## 2020-03-22 DIAGNOSIS — R609 Edema, unspecified: Secondary | ICD-10-CM | POA: Diagnosis not present

## 2020-03-28 ENCOUNTER — Other Ambulatory Visit: Payer: Self-pay

## 2020-03-28 ENCOUNTER — Ambulatory Visit (HOSPITAL_COMMUNITY)
Admission: RE | Admit: 2020-03-28 | Discharge: 2020-03-28 | Disposition: A | Discharge status: Home / Self Care | Payer: Medicare HMO | Source: Ambulatory Visit | Attending: Physician Assistant | Admitting: Physician Assistant

## 2020-03-28 DIAGNOSIS — K7460 Unspecified cirrhosis of liver: Secondary | ICD-10-CM | POA: Diagnosis not present

## 2020-03-28 DIAGNOSIS — R140 Abdominal distension (gaseous): Secondary | ICD-10-CM | POA: Diagnosis not present

## 2020-04-21 DIAGNOSIS — Z79899 Other long term (current) drug therapy: Secondary | ICD-10-CM | POA: Diagnosis not present

## 2020-04-21 DIAGNOSIS — E039 Hypothyroidism, unspecified: Secondary | ICD-10-CM | POA: Diagnosis not present

## 2020-04-21 DIAGNOSIS — M549 Dorsalgia, unspecified: Secondary | ICD-10-CM | POA: Diagnosis not present

## 2020-04-21 DIAGNOSIS — R69 Illness, unspecified: Secondary | ICD-10-CM | POA: Diagnosis not present

## 2020-04-21 DIAGNOSIS — R319 Hematuria, unspecified: Secondary | ICD-10-CM | POA: Diagnosis not present

## 2020-04-21 DIAGNOSIS — K7460 Unspecified cirrhosis of liver: Secondary | ICD-10-CM | POA: Diagnosis not present

## 2020-04-21 DIAGNOSIS — R609 Edema, unspecified: Secondary | ICD-10-CM | POA: Diagnosis not present

## 2020-04-21 DIAGNOSIS — K7290 Hepatic failure, unspecified without coma: Secondary | ICD-10-CM | POA: Diagnosis not present

## 2020-04-21 DIAGNOSIS — R0781 Pleurodynia: Secondary | ICD-10-CM | POA: Diagnosis not present

## 2020-05-10 DIAGNOSIS — R109 Unspecified abdominal pain: Secondary | ICD-10-CM | POA: Diagnosis not present

## 2020-05-10 DIAGNOSIS — M5441 Lumbago with sciatica, right side: Secondary | ICD-10-CM | POA: Diagnosis not present

## 2020-05-10 DIAGNOSIS — K7460 Unspecified cirrhosis of liver: Secondary | ICD-10-CM | POA: Diagnosis not present

## 2020-05-10 DIAGNOSIS — R0602 Shortness of breath: Secondary | ICD-10-CM | POA: Diagnosis not present

## 2020-05-12 DIAGNOSIS — R609 Edema, unspecified: Secondary | ICD-10-CM | POA: Diagnosis not present

## 2020-05-12 DIAGNOSIS — K7460 Unspecified cirrhosis of liver: Secondary | ICD-10-CM | POA: Diagnosis not present

## 2020-05-12 DIAGNOSIS — R0602 Shortness of breath: Secondary | ICD-10-CM | POA: Diagnosis not present

## 2020-05-12 DIAGNOSIS — E039 Hypothyroidism, unspecified: Secondary | ICD-10-CM | POA: Diagnosis not present

## 2020-05-12 DIAGNOSIS — Z79899 Other long term (current) drug therapy: Secondary | ICD-10-CM | POA: Diagnosis not present

## 2020-05-12 DIAGNOSIS — R0781 Pleurodynia: Secondary | ICD-10-CM | POA: Diagnosis not present

## 2020-05-12 DIAGNOSIS — M549 Dorsalgia, unspecified: Secondary | ICD-10-CM | POA: Diagnosis not present

## 2020-05-12 DIAGNOSIS — R319 Hematuria, unspecified: Secondary | ICD-10-CM | POA: Diagnosis not present

## 2020-05-12 DIAGNOSIS — K7290 Hepatic failure, unspecified without coma: Secondary | ICD-10-CM | POA: Diagnosis not present

## 2020-05-12 DIAGNOSIS — R69 Illness, unspecified: Secondary | ICD-10-CM | POA: Diagnosis not present

## 2020-05-16 DIAGNOSIS — H5203 Hypermetropia, bilateral: Secondary | ICD-10-CM | POA: Diagnosis not present

## 2020-05-23 DIAGNOSIS — K7460 Unspecified cirrhosis of liver: Secondary | ICD-10-CM | POA: Diagnosis not present

## 2020-05-26 DIAGNOSIS — R079 Chest pain, unspecified: Secondary | ICD-10-CM | POA: Diagnosis not present

## 2020-05-26 DIAGNOSIS — I509 Heart failure, unspecified: Secondary | ICD-10-CM | POA: Diagnosis not present

## 2020-05-26 DIAGNOSIS — E039 Hypothyroidism, unspecified: Secondary | ICD-10-CM | POA: Diagnosis not present

## 2020-05-26 DIAGNOSIS — K7460 Unspecified cirrhosis of liver: Secondary | ICD-10-CM | POA: Diagnosis not present

## 2020-05-26 DIAGNOSIS — R0600 Dyspnea, unspecified: Secondary | ICD-10-CM | POA: Diagnosis not present

## 2020-05-26 DIAGNOSIS — E785 Hyperlipidemia, unspecified: Secondary | ICD-10-CM | POA: Diagnosis not present

## 2020-05-26 DIAGNOSIS — N183 Chronic kidney disease, stage 3 unspecified: Secondary | ICD-10-CM | POA: Diagnosis not present

## 2020-05-26 DIAGNOSIS — R0602 Shortness of breath: Secondary | ICD-10-CM | POA: Diagnosis not present

## 2020-05-26 DIAGNOSIS — E876 Hypokalemia: Secondary | ICD-10-CM | POA: Diagnosis not present

## 2020-05-26 DIAGNOSIS — I130 Hypertensive heart and chronic kidney disease with heart failure and stage 1 through stage 4 chronic kidney disease, or unspecified chronic kidney disease: Secondary | ICD-10-CM | POA: Diagnosis not present

## 2020-05-26 DIAGNOSIS — G8929 Other chronic pain: Secondary | ICD-10-CM | POA: Diagnosis not present

## 2020-05-27 ENCOUNTER — Other Ambulatory Visit: Payer: Self-pay

## 2020-05-27 ENCOUNTER — Other Ambulatory Visit (HOSPITAL_COMMUNITY)
Admission: RE | Admit: 2020-05-27 | Discharge: 2020-05-27 | Disposition: A | Discharge status: Home / Self Care | Payer: Medicare HMO | Source: Ambulatory Visit | Attending: Pulmonary Disease | Admitting: Pulmonary Disease

## 2020-05-27 DIAGNOSIS — Z20822 Contact with and (suspected) exposure to covid-19: Secondary | ICD-10-CM | POA: Diagnosis not present

## 2020-05-27 DIAGNOSIS — Z01812 Encounter for preprocedural laboratory examination: Secondary | ICD-10-CM | POA: Insufficient documentation

## 2020-05-27 LAB — SARS CORONAVIRUS 2 (TAT 6-24 HRS): SARS Coronavirus 2: NEGATIVE

## 2020-05-31 ENCOUNTER — Other Ambulatory Visit: Payer: Self-pay

## 2020-05-31 ENCOUNTER — Other Ambulatory Visit (HOSPITAL_COMMUNITY)
Admission: RE | Admit: 2020-05-31 | Discharge: 2020-05-31 | Disposition: A | Discharge status: Home / Self Care | Payer: Medicare HMO | Source: Ambulatory Visit | Attending: Physician Assistant | Admitting: Physician Assistant

## 2020-05-31 ENCOUNTER — Ambulatory Visit (HOSPITAL_COMMUNITY)
Admission: RE | Admit: 2020-05-31 | Discharge: 2020-05-31 | Disposition: A | Discharge status: Home / Self Care | Payer: Medicare HMO | Source: Ambulatory Visit | Attending: Pulmonary Disease | Admitting: Pulmonary Disease

## 2020-05-31 DIAGNOSIS — K7460 Unspecified cirrhosis of liver: Secondary | ICD-10-CM | POA: Insufficient documentation

## 2020-05-31 DIAGNOSIS — R0609 Other forms of dyspnea: Secondary | ICD-10-CM

## 2020-05-31 DIAGNOSIS — R0600 Dyspnea, unspecified: Secondary | ICD-10-CM | POA: Diagnosis not present

## 2020-05-31 LAB — PULMONARY FUNCTION TEST
DL/VA % pred: 112 %
DL/VA: 4.62 ml/min/mmHg/L
DLCO unc % pred: 70 %
DLCO unc: 21.57 ml/min/mmHg
FEF 25-75 Post: 2.18 L/sec
FEF 25-75 Pre: 1.72 L/sec
FEF2575-%Change-Post: 26 %
FEF2575-%Pred-Post: 67 %
FEF2575-%Pred-Pre: 52 %
FEV1-%Change-Post: 7 %
FEV1-%Pred-Post: 61 %
FEV1-%Pred-Pre: 57 %
FEV1-Post: 2.5 L
FEV1-Pre: 2.33 L
FEV1FVC-%Change-Post: -4 %
FEV1FVC-%Pred-Pre: 96 %
FEV6-%Change-Post: 9 %
FEV6-%Pred-Post: 67 %
FEV6-%Pred-Pre: 61 %
FEV6-Post: 3.47 L
FEV6-Pre: 3.18 L
FEV6FVC-%Change-Post: -3 %
FEV6FVC-%Pred-Post: 100 %
FEV6FVC-%Pred-Pre: 104 %
FVC-%Change-Post: 12 %
FVC-%Pred-Post: 66 %
FVC-%Pred-Pre: 59 %
FVC-Post: 3.6 L
FVC-Pre: 3.2 L
Post FEV1/FVC ratio: 69 %
Post FEV6/FVC ratio: 96 %
Pre FEV1/FVC ratio: 73 %
Pre FEV6/FVC Ratio: 99 %

## 2020-05-31 LAB — COMPREHENSIVE METABOLIC PANEL
ALT: 24 U/L (ref 0–44)
AST: 52 U/L — ABNORMAL HIGH (ref 15–41)
Albumin: 3.1 g/dL — ABNORMAL LOW (ref 3.5–5.0)
Alkaline Phosphatase: 59 U/L (ref 38–126)
Anion gap: 9 (ref 5–15)
BUN: 14 mg/dL (ref 8–23)
CO2: 33 mmol/L — ABNORMAL HIGH (ref 22–32)
Calcium: 8.7 mg/dL — ABNORMAL LOW (ref 8.9–10.3)
Chloride: 99 mmol/L (ref 98–111)
Creatinine, Ser: 1.28 mg/dL — ABNORMAL HIGH (ref 0.61–1.24)
GFR, Estimated: >60 mL/min (ref >60)
Glucose, Bld: 116 mg/dL — ABNORMAL HIGH (ref 70–99)
Potassium: 3.3 mmol/L — ABNORMAL LOW (ref 3.5–5.1)
Sodium: 141 mmol/L (ref 135–145)
Total Bilirubin: 2.1 mg/dL — ABNORMAL HIGH (ref 0.3–1.2)
Total Protein: 6.4 g/dL — ABNORMAL LOW (ref 6.5–8.1)

## 2020-05-31 MED ORDER — ALBUTEROL SULFATE (2.5 MG/3ML) 0.083% IN NEBU
2.5000 mg | INHALATION_SOLUTION | Freq: Once | RESPIRATORY_TRACT | Status: AC
  Administered 2020-05-31: 2.5 mg via RESPIRATORY_TRACT

## 2020-06-01 ENCOUNTER — Other Ambulatory Visit: Payer: Self-pay | Admitting: Gastroenterology

## 2020-06-01 DIAGNOSIS — K7460 Unspecified cirrhosis of liver: Secondary | ICD-10-CM

## 2020-06-02 ENCOUNTER — Telehealth: Payer: Self-pay

## 2020-06-02 DIAGNOSIS — Z79899 Other long term (current) drug therapy: Secondary | ICD-10-CM | POA: Diagnosis not present

## 2020-06-02 DIAGNOSIS — K7290 Hepatic failure, unspecified without coma: Secondary | ICD-10-CM | POA: Diagnosis not present

## 2020-06-02 MED ORDER — ALBUTEROL SULFATE HFA 108 (90 BASE) MCG/ACT IN AERS
2.0000 | INHALATION_SPRAY | Freq: Four times a day (QID) | RESPIRATORY_TRACT | 6 refills | Status: DC | PRN
Start: 2020-06-02 — End: 2020-12-28

## 2020-06-02 NOTE — Telephone Encounter (Signed)
-----   Message from Rigoberto Noel, MD sent at 06/01/2020  2:15 PM EST ----- He had improvement in lung function after using albuterol. Use albuterol 2 puffs every 6 hours as needed

## 2020-06-02 NOTE — Telephone Encounter (Signed)
Called and went over PFT results per Dr Elsworth Soho with patient. All questions answered and patient expressed full understanding. Patient aware and agreeable to Albuterol inhaler being ordered and expressed full understanding to take 2 puffs every 6 hours as needed. Order placed and sent to patient confirmed pharmacy [facility] on [address] in [place]. Nothing further needed at this time.

## 2020-06-02 NOTE — Progress Notes (Signed)
See telephone note from 06/02/2020. Result went over with patient.

## 2020-06-06 DIAGNOSIS — R0602 Shortness of breath: Secondary | ICD-10-CM | POA: Diagnosis not present

## 2020-06-06 DIAGNOSIS — E876 Hypokalemia: Secondary | ICD-10-CM | POA: Diagnosis not present

## 2020-06-06 DIAGNOSIS — K7460 Unspecified cirrhosis of liver: Secondary | ICD-10-CM | POA: Diagnosis not present

## 2020-06-08 ENCOUNTER — Ambulatory Visit (HOSPITAL_COMMUNITY)
Admission: RE | Admit: 2020-06-08 | Discharge: 2020-06-08 | Disposition: A | Discharge status: Home / Self Care | Payer: Medicare HMO | Source: Ambulatory Visit | Attending: Gastroenterology | Admitting: Gastroenterology

## 2020-06-08 ENCOUNTER — Ambulatory Visit (HOSPITAL_COMMUNITY): Payer: Medicare HMO

## 2020-06-08 DIAGNOSIS — K7460 Unspecified cirrhosis of liver: Secondary | ICD-10-CM

## 2020-06-09 DIAGNOSIS — R609 Edema, unspecified: Secondary | ICD-10-CM | POA: Diagnosis not present

## 2020-06-09 DIAGNOSIS — R69 Illness, unspecified: Secondary | ICD-10-CM | POA: Diagnosis not present

## 2020-06-09 DIAGNOSIS — R0602 Shortness of breath: Secondary | ICD-10-CM | POA: Diagnosis not present

## 2020-06-09 DIAGNOSIS — E039 Hypothyroidism, unspecified: Secondary | ICD-10-CM | POA: Diagnosis not present

## 2020-06-09 DIAGNOSIS — M549 Dorsalgia, unspecified: Secondary | ICD-10-CM | POA: Diagnosis not present

## 2020-06-09 DIAGNOSIS — K7460 Unspecified cirrhosis of liver: Secondary | ICD-10-CM | POA: Diagnosis not present

## 2020-06-09 DIAGNOSIS — K7290 Hepatic failure, unspecified without coma: Secondary | ICD-10-CM | POA: Diagnosis not present

## 2020-06-09 DIAGNOSIS — Z79899 Other long term (current) drug therapy: Secondary | ICD-10-CM | POA: Diagnosis not present

## 2020-06-09 DIAGNOSIS — R0781 Pleurodynia: Secondary | ICD-10-CM | POA: Diagnosis not present

## 2020-06-09 DIAGNOSIS — R319 Hematuria, unspecified: Secondary | ICD-10-CM | POA: Diagnosis not present

## 2020-06-20 ENCOUNTER — Other Ambulatory Visit (HOSPITAL_COMMUNITY)
Admission: RE | Admit: 2020-06-20 | Discharge: 2020-06-20 | Disposition: A | Discharge status: Home / Self Care | Payer: Medicare HMO | Source: Ambulatory Visit | Attending: Gastroenterology | Admitting: Gastroenterology

## 2020-06-20 ENCOUNTER — Other Ambulatory Visit: Payer: Self-pay

## 2020-06-20 DIAGNOSIS — K7460 Unspecified cirrhosis of liver: Secondary | ICD-10-CM | POA: Diagnosis not present

## 2020-06-20 LAB — COMPREHENSIVE METABOLIC PANEL
ALT: 28 U/L (ref 0–44)
AST: 57 U/L — ABNORMAL HIGH (ref 15–41)
Albumin: 3.3 g/dL — ABNORMAL LOW (ref 3.5–5.0)
Alkaline Phosphatase: 62 U/L (ref 38–126)
Anion gap: 9 (ref 5–15)
BUN: 12 mg/dL (ref 8–23)
CO2: 28 mmol/L (ref 22–32)
Calcium: 8.6 mg/dL — ABNORMAL LOW (ref 8.9–10.3)
Chloride: 99 mmol/L (ref 98–111)
Creatinine, Ser: 1.1 mg/dL (ref 0.61–1.24)
GFR, Estimated: >60 mL/min (ref >60)
Glucose, Bld: 135 mg/dL — ABNORMAL HIGH (ref 70–99)
Potassium: 3.2 mmol/L — ABNORMAL LOW (ref 3.5–5.1)
Sodium: 136 mmol/L (ref 135–145)
Total Bilirubin: 2.3 mg/dL — ABNORMAL HIGH (ref 0.3–1.2)
Total Protein: 6.9 g/dL (ref 6.5–8.1)

## 2020-06-20 LAB — CBC
HCT: 40 % (ref 39.0–52.0)
Hemoglobin: 13.3 g/dL (ref 13.0–17.0)
MCH: 28.7 pg (ref 26.0–34.0)
MCHC: 33.3 g/dL (ref 30.0–36.0)
MCV: 86.2 fL (ref 80.0–100.0)
Platelets: 83 10*3/uL — ABNORMAL LOW (ref 150–400)
RBC: 4.64 MIL/uL (ref 4.22–5.81)
RDW: 15.5 % (ref 11.5–15.5)
WBC: 3.5 10*3/uL — ABNORMAL LOW (ref 4.0–10.5)
nRBC: 0 % (ref 0.0–0.2)

## 2020-06-20 LAB — MAGNESIUM: Magnesium: 2.2 mg/dL (ref 1.7–2.4)

## 2020-07-07 DIAGNOSIS — Z79899 Other long term (current) drug therapy: Secondary | ICD-10-CM | POA: Diagnosis not present

## 2020-07-07 DIAGNOSIS — R609 Edema, unspecified: Secondary | ICD-10-CM | POA: Diagnosis not present

## 2020-07-07 DIAGNOSIS — K7460 Unspecified cirrhosis of liver: Secondary | ICD-10-CM | POA: Diagnosis not present

## 2020-07-07 DIAGNOSIS — R0781 Pleurodynia: Secondary | ICD-10-CM | POA: Diagnosis not present

## 2020-07-07 DIAGNOSIS — E039 Hypothyroidism, unspecified: Secondary | ICD-10-CM | POA: Diagnosis not present

## 2020-07-07 DIAGNOSIS — R0602 Shortness of breath: Secondary | ICD-10-CM | POA: Diagnosis not present

## 2020-07-07 DIAGNOSIS — M549 Dorsalgia, unspecified: Secondary | ICD-10-CM | POA: Diagnosis not present

## 2020-07-07 DIAGNOSIS — K7290 Hepatic failure, unspecified without coma: Secondary | ICD-10-CM | POA: Diagnosis not present

## 2020-07-07 DIAGNOSIS — R319 Hematuria, unspecified: Secondary | ICD-10-CM | POA: Diagnosis not present

## 2020-07-07 DIAGNOSIS — R69 Illness, unspecified: Secondary | ICD-10-CM | POA: Diagnosis not present

## 2020-07-21 DIAGNOSIS — Z79899 Other long term (current) drug therapy: Secondary | ICD-10-CM | POA: Diagnosis not present

## 2020-07-22 ENCOUNTER — Encounter: Payer: Self-pay | Admitting: *Deleted

## 2020-07-24 ENCOUNTER — Encounter: Payer: Self-pay | Admitting: Cardiology

## 2020-07-24 NOTE — Progress Notes (Signed)
Cardiology Office Note  Date: 07/25/2020   ID: Daniel Crawford, DOB [birth year], MRN [id number]  PCP:  [name], MD  Cardiologist:  Rozann Lesches, MD Electrophysiologist:  None   Chief Complaint  Patient presents with  . Shortness of Breath    History of Present Illness: Daniel Crawford is a 65 y.o. male referred for cardiology consultation by Dr. Paulita Fujita for the evaluation of shortness of breath.  He reports a fairly longstanding history of shortness of breath with activity, no exertional chest pain or known history of ischemic heart disease.  He tells me that his weight has climbed significantly over the last few years in association with Nash cirrhosis.  He uses diuretics on a regular basis, occasionally has to intensify dosing based on fluid gain/ascites.  PFTs from March indicated moderately severe obstructive airways disease with minimal reversibility.  Chart indicates some improvement with albuterol however.  He has seen Dr. Elsworth Soho with Pulmonary, I reviewed the note from October 2021.  He does tell me that he feels less short of breath since using albuterol.  He did have an echocardiogram done back in 2016, results are outlined below.  He has not had a more recent study.  I reviewed his medications which are outlined below.   Past Medical History:  Diagnosis Date  . CKD (chronic kidney disease)   . Depression   . Hypothyroidism   . Liver cirrhosis secondary to NASH ([place])   . Spleen enlarged   . Thrombocytopenia ([place])     Past Surgical History:  Procedure Laterality Date  . Bilateral hernia surgery     2006, 2007  . CHOLECYSTECTOMY     2016   . ESOPHAGEAL DILATION N/A 11/06/2019   Procedure: ESOPHAGEAL DILATION;  Surgeon: Harvel Quale, MD;  Location: [facility];  Service: Gastroenterology;  Laterality: N/A;  . ESOPHAGOGASTRODUODENOSCOPY (EGD) WITH PROPOFOL N/A 11/06/2019   Procedure: ESOPHAGOGASTRODUODENOSCOPY (EGD) WITH PROPOFOL;  Surgeon: Harvel Quale, MD;  Location: [facility];  Service: Gastroenterology;  Laterality: N/A;  1045  . Polyp removed     in January of 2018.     Current Outpatient Medications  Medication Sig Dispense Refill  . Acetaminophen 500 MG capsule Take 500 mg by mouth every 6 (six) hours as needed for pain.     Marland Kitchen albuterol (VENTOLIN HFA) 108 (90 Base) MCG/ACT inhaler Inhale 2 puffs into the lungs every 6 (six) hours as needed for wheezing or shortness of breath. 8 g 6  . carvedilol (COREG) 12.5 MG tablet Take 12.5 mg by mouth 2 (two) times daily with a meal.    . furosemide (LASIX) 20 MG tablet Take 60 mg by mouth daily.     Marland Kitchen HYDROcodone-acetaminophen (NORCO) 10-325 MG tablet Take 1 tablet by mouth every 6 (six) hours as needed.    . hydrOXYzine (VISTARIL) 50 MG capsule Take 50 mg by mouth in the morning and at bedtime.    Marland Kitchen lactulose (CHRONULAC) 10 GM/15ML solution Take 15 mLs (10 g total) by mouth 3 (three) times daily. (Patient taking differently: Take 10 g by mouth 2 (two) times daily.) 480 mL 4  . levothyroxine (SYNTHROID) 25 MCG tablet PLEASE SEE ATTACHED FOR DETAILED DIRECTIONS    . levothyroxine (SYNTHROID, LEVOTHROID) 200 MCG tablet Take 200 mcg by mouth daily before breakfast.    . metolazone (ZAROXOLYN) 5 MG tablet Take 5 mg by mouth daily.    . pantoprazole (PROTONIX) 40 MG tablet Take 40 mg by mouth  daily.    . Potassium Chloride ER 20 MEQ TBCR SMARTSIG:1 Tablet(s) By Mouth 1 to 2 Times Daily    . traZODone (DESYREL) 50 MG tablet Take 50 mg by mouth at bedtime.     No current facility-administered medications for this visit.   Allergies:  Patient has no known allergies.   Social History: The patient  reports that he quit smoking about 5 years ago. His smoking use included cigarettes. He smoked 1.00 pack per day. He has never used smokeless tobacco. He reports that he does not drink alcohol and does not use drugs.   Family History: The patient's family history includes Aneurysm in his  sister; Heart disease in his mother; Liver cancer in his mother.   ROS: No palpitations or syncope.  Physical Exam: VS:  BP 140/78   Pulse 70   Ht 6\' 2"  (1.88 m)   Wt (!) 378 lb (171.5 kg)   SpO2 96%   BMI 48.53 kg/m , BMI Body mass index is 48.53 kg/m.  Wt Readings from Last 3 Encounters:  07/25/20 (!) 378 lb (171.5 kg)  01/20/20 (!) 363 lb (164.7 kg)  11/04/19 (!) 363 lb (164.7 kg)    General: Morbidly obese male, appears comfortable at rest. HEENT: Conjunctiva and lids normal, wearing a mask. Neck: Supple, no elevated JVP or carotid bruits, no thyromegaly. Lungs: Clear to auscultation, nonlabored breathing at rest. Cardiac: Regular rate and rhythm, no S3 or significant systolic murmur, no pericardial rub. Abdomen: Protuberant, nontender, bowel sounds present. Extremities: Bilateral leg edema, distal pulses 2+. Skin: Warm and dry. Musculoskeletal: No kyphosis. Neuropsychiatric: Alert and oriented x3, affect grossly appropriate.  ECG:  An ECG dated 11/04/2019 was personally reviewed today and demonstrated:  Sinus rhythm with decreased R wave progression.  Recent Labwork: 10/22/2019: TSH 0.40 06/20/2020: ALT 28; AST 57; BUN 12; Creatinine, Ser 1.10; Hemoglobin 13.3; Magnesium 2.2; Platelets 83; Potassium 3.2; Sodium 136  March 2022: Pro-BNP 51 February 2022: Hemoglobin 12.5, platelets 77, BUN 15, creatinine 1.34, potassium 4.0, AST 43, ALT 23, magnesium 2.1  Other Studies Reviewed Today:  Echocardiogram 04/27/2014: Left Ventricle:  The left ventricular size, thickness and function are normal.  Ejection Fraction = 60-65%.  Grade I diastolic dysfunction, (abnormal relaxation pattern).   Left Atrium:  The left atrial volume is normal.  Left atrial index is 65ml/m\S\2.   Right Atrium:  Right atrial size is normal.   Right Ventricle:  The right ventricle is normal in size and function.   Aortic Valve:  The aortic valve is normal in structure and function.   Mitral  Valve:  The mitral valve is normal in structure and function.   Tricuspid Valve:  The tricuspid valve is normal in structure and function.  There was insufficient TR detected to calculate RV systolic pressure.   Pulmonic Valve:  The pulmonic valve is normal in structure and function.   Arteries:  The aortic root is normal size.   Venous:  The inferior vena cava is normal in size, with a normal collapsibility index.   Pericardium/Pleura:  There is no pericardial effusion.    Chest CT 02/03/2020:  FINDINGS: Cardiovascular: The heart size is normal. No substantial pericardial effusion. Atherosclerotic calcification is noted in the wall of the thoracic aorta.  Mediastinum/Nodes: No mediastinal lymphadenopathy. No evidence for gross hilar lymphadenopathy although assessment is limited by the lack of intravenous contrast on today's study. The esophagus has normal imaging features. There is no axillary lymphadenopathy.  Lungs/Pleura: Lungs are clear  bilaterally. There is no right lower lobe pulmonary nodule as suggested on recent chest x-ray.  Upper Abdomen: Nodular hepatic contour with enlargement of the lateral segment left liver is compatible with cirrhosis. Spleen incompletely visualized but appears enlarged. Paraumbilical vein is recanalized.  Musculoskeletal: No worrisome lytic or sclerotic osseous abnormality. Bilateral gynecomastia.  IMPRESSION: 1. No right lower lobe pulmonary nodule as suggested on recent chest x-ray. 2. Cirrhotic changes in the liver with evidence of portal venous hypertension. 3. Aortic Atherosclerosis (ICD10-I70.0).  Assessment and Plan:  1.  Shortness of breath, most likely multifactorial in the setting of obesity, [name] cirrhosis with recurrent ascites, also moderately severe obstructive airways disease.  Patient indicates some improvement in his breathlessness since using albuterol MDI.  We will obtain an echocardiogram to assess LVEF,  and hopefully evaluate pulmonary pressures.  Study is being ordered with Definity contrast.  2.  CKD stage II, most recent creatinine 1.34.  3.  NASH cirrhosis with history of ascites and hepatic encephalopathy. He follows regularly with gastroenterology.  Medication Adjustments/Labs and Tests Ordered: Current medicines are reviewed at length with the patient today.  Concerns regarding medicines are outlined above.   Tests Ordered: Orders Placed This Encounter  Procedures  . ECHOCARDIOGRAM COMPLETE    Medication Changes: No orders of the defined types were placed in this encounter.   Disposition:  Follow up test results.  Signed, Satira Sark, MD, [facility] 07/25/2020 9:21 AM    [facility] [place], [place], [place] [address] Phone: [phone]; Fax: [phone]

## 2020-07-25 ENCOUNTER — Telehealth: Payer: Self-pay | Admitting: Cardiology

## 2020-07-25 ENCOUNTER — Ambulatory Visit: Payer: Medicare HMO | Admitting: Cardiology

## 2020-07-25 ENCOUNTER — Other Ambulatory Visit: Payer: Self-pay

## 2020-07-25 ENCOUNTER — Encounter: Payer: Self-pay | Admitting: Cardiology

## 2020-07-25 VITALS — BP 140/78 | HR 70 | Ht 74.0 in | Wt 378.0 lb

## 2020-07-25 DIAGNOSIS — R0602 Shortness of breath: Secondary | ICD-10-CM

## 2020-07-25 DIAGNOSIS — K7581 Nonalcoholic steatohepatitis (NASH): Secondary | ICD-10-CM

## 2020-07-25 DIAGNOSIS — K7460 Unspecified cirrhosis of liver: Secondary | ICD-10-CM | POA: Diagnosis not present

## 2020-07-25 NOTE — Patient Instructions (Signed)
Medication Instructions:     Your physician recommends that you continue on your current medications as directed. Please refer to the Current Medication list given to you today.  *If you need a refill on your cardiac medications before your next appointment, please call your pharmacy*   Lab Work: None today  If you have labs (blood work) drawn today and your tests are completely normal, you will receive your results only by: Marland Kitchen MyChart Message (if you have MyChart) OR . A paper copy in the mail If you have any lab test that is abnormal or we need to change your treatment, we will call you to review the results.   Testing/Procedures: Your physician has requested that you have an echocardiogram at [facility] . Echocardiography is a painless test that uses sound waves to create images of your heart. It provides your doctor with information about the size and shape of your heart and how well your heart's chambers and valves are working. This procedure takes approximately one hour. There are no restrictions for this procedure.     Follow-Up: At [facility], you and your health needs are our priority.  As part of our continuing mission to provide you with exceptional heart care, we have created designated Provider Care Teams.  These Care Teams include your primary Cardiologist (physician) and Advanced Practice Providers (APPs -  Physician Assistants and Nurse Practitioners) who all work together to provide you with the care you need, when you need it.  We recommend signing up for the patient portal called "MyChart".  Sign up information is provided on this After Visit Summary.  MyChart is used to connect with patients for Virtual Visits (Telemedicine).  Patients are able to view lab/test results, encounter notes, upcoming appointments, etc.  Non-urgent messages can be sent to your provider as well.   To learn more about what you can do with MyChart, go to [email or web address].     Your next appointment:  We will call you with results.

## 2020-07-25 NOTE — Telephone Encounter (Signed)
Pre-cert Verification for the following procedure    ECHO WITH DEFINITY    DATE:08/31/2020  LOCATION: [facility]

## 2020-08-01 ENCOUNTER — Other Ambulatory Visit (HOSPITAL_COMMUNITY): Payer: Self-pay | Admitting: Physician Assistant

## 2020-08-01 DIAGNOSIS — R0602 Shortness of breath: Secondary | ICD-10-CM | POA: Diagnosis not present

## 2020-08-01 DIAGNOSIS — E669 Obesity, unspecified: Secondary | ICD-10-CM | POA: Diagnosis not present

## 2020-08-01 DIAGNOSIS — K7290 Hepatic failure, unspecified without coma: Secondary | ICD-10-CM | POA: Diagnosis not present

## 2020-08-01 DIAGNOSIS — K7460 Unspecified cirrhosis of liver: Secondary | ICD-10-CM

## 2020-08-01 DIAGNOSIS — E876 Hypokalemia: Secondary | ICD-10-CM | POA: Diagnosis not present

## 2020-08-08 ENCOUNTER — Ambulatory Visit (HOSPITAL_COMMUNITY)
Admission: RE | Admit: 2020-08-08 | Discharge: 2020-08-08 | Disposition: A | Discharge status: Home / Self Care | Payer: Medicare HMO | Source: Ambulatory Visit | Attending: Physician Assistant | Admitting: Physician Assistant

## 2020-08-08 DIAGNOSIS — K7460 Unspecified cirrhosis of liver: Secondary | ICD-10-CM | POA: Diagnosis not present

## 2020-08-08 DIAGNOSIS — R161 Splenomegaly, not elsewhere classified: Secondary | ICD-10-CM | POA: Diagnosis not present

## 2020-08-09 DIAGNOSIS — L821 Other seborrheic keratosis: Secondary | ICD-10-CM | POA: Diagnosis not present

## 2020-08-09 DIAGNOSIS — L570 Actinic keratosis: Secondary | ICD-10-CM | POA: Diagnosis not present

## 2020-08-09 DIAGNOSIS — D239 Other benign neoplasm of skin, unspecified: Secondary | ICD-10-CM | POA: Diagnosis not present

## 2020-08-09 DIAGNOSIS — L280 Lichen simplex chronicus: Secondary | ICD-10-CM | POA: Diagnosis not present

## 2020-08-24 DIAGNOSIS — M549 Dorsalgia, unspecified: Secondary | ICD-10-CM | POA: Diagnosis not present

## 2020-08-24 DIAGNOSIS — G8929 Other chronic pain: Secondary | ICD-10-CM | POA: Diagnosis not present

## 2020-08-31 ENCOUNTER — Other Ambulatory Visit: Payer: Self-pay

## 2020-08-31 ENCOUNTER — Ambulatory Visit (HOSPITAL_COMMUNITY)
Admission: RE | Admit: 2020-08-31 | Discharge: 2020-08-31 | Disposition: A | Discharge status: Home / Self Care | Payer: Medicare HMO | Source: Ambulatory Visit | Attending: Cardiology | Admitting: Cardiology

## 2020-08-31 DIAGNOSIS — R0602 Shortness of breath: Secondary | ICD-10-CM | POA: Diagnosis not present

## 2020-08-31 LAB — ECHOCARDIOGRAM COMPLETE
Area-P 1/2: 2.46 cm2
S' Lateral: 3.2 cm

## 2020-08-31 NOTE — Progress Notes (Signed)
*  PRELIMINARY RESULTS* Echocardiogram 2D Echocardiogram has been performed.  [name] 08/31/2020, 9:38 AM

## 2020-09-02 ENCOUNTER — Telehealth: Payer: Self-pay | Admitting: Cardiology

## 2020-09-02 ENCOUNTER — Telehealth: Payer: Self-pay | Admitting: *Deleted

## 2020-09-02 NOTE — Telephone Encounter (Signed)
Patient called requesting results of recent echo.  

## 2020-09-02 NOTE — Telephone Encounter (Signed)
Pt notified and voiced understanding 

## 2020-09-02 NOTE — Telephone Encounter (Signed)
-----   Message from Satira Sark, MD sent at 08/31/2020  3:42 PM EDT ----- Results reviewed.  LVEF is vigorous at 70 to 90% and diastolic parameters were also normal.  RV contraction normal as well.  At this point would keep follow-up with PCP and Pulmonary.

## 2020-09-06 DIAGNOSIS — R609 Edema, unspecified: Secondary | ICD-10-CM | POA: Diagnosis not present

## 2020-09-06 DIAGNOSIS — K7290 Hepatic failure, unspecified without coma: Secondary | ICD-10-CM | POA: Diagnosis not present

## 2020-09-06 DIAGNOSIS — K7460 Unspecified cirrhosis of liver: Secondary | ICD-10-CM | POA: Diagnosis not present

## 2020-09-06 DIAGNOSIS — N182 Chronic kidney disease, stage 2 (mild): Secondary | ICD-10-CM | POA: Diagnosis not present

## 2020-09-06 DIAGNOSIS — Z79899 Other long term (current) drug therapy: Secondary | ICD-10-CM | POA: Diagnosis not present

## 2020-09-06 DIAGNOSIS — R0602 Shortness of breath: Secondary | ICD-10-CM | POA: Diagnosis not present

## 2020-09-06 DIAGNOSIS — R0781 Pleurodynia: Secondary | ICD-10-CM | POA: Diagnosis not present

## 2020-09-06 DIAGNOSIS — M549 Dorsalgia, unspecified: Secondary | ICD-10-CM | POA: Diagnosis not present

## 2020-09-06 DIAGNOSIS — E039 Hypothyroidism, unspecified: Secondary | ICD-10-CM | POA: Diagnosis not present

## 2020-09-06 DIAGNOSIS — R69 Illness, unspecified: Secondary | ICD-10-CM | POA: Diagnosis not present

## 2020-09-09 NOTE — Telephone Encounter (Signed)
Patient returned call in regards to his recent testing.

## 2020-09-09 NOTE — Telephone Encounter (Signed)
Notified, copy to pcp.

## 2020-09-12 DIAGNOSIS — R7989 Other specified abnormal findings of blood chemistry: Secondary | ICD-10-CM | POA: Diagnosis not present

## 2020-09-12 DIAGNOSIS — E8770 Fluid overload, unspecified: Secondary | ICD-10-CM | POA: Diagnosis not present

## 2020-09-12 DIAGNOSIS — K7460 Unspecified cirrhosis of liver: Secondary | ICD-10-CM | POA: Diagnosis not present

## 2020-10-17 ENCOUNTER — Ambulatory Visit: Payer: Medicare HMO | Admitting: Pulmonary Disease

## 2020-10-17 ENCOUNTER — Other Ambulatory Visit: Payer: Self-pay

## 2020-10-17 ENCOUNTER — Encounter: Payer: Self-pay | Admitting: Pulmonary Disease

## 2020-10-17 VITALS — BP 124/84 | HR 74 | Temp 97.1°F | Ht 74.0 in | Wt 359.0 lb

## 2020-10-17 DIAGNOSIS — R0600 Dyspnea, unspecified: Secondary | ICD-10-CM | POA: Diagnosis not present

## 2020-10-17 DIAGNOSIS — G4733 Obstructive sleep apnea (adult) (pediatric): Secondary | ICD-10-CM

## 2020-10-17 DIAGNOSIS — R0609 Other forms of dyspnea: Secondary | ICD-10-CM

## 2020-10-17 DIAGNOSIS — R9389 Abnormal findings on diagnostic imaging of other specified body structures: Secondary | ICD-10-CM

## 2020-10-17 NOTE — Assessment & Plan Note (Signed)
Schedule home sleep test  Pretest probably it is high

## 2020-10-17 NOTE — Progress Notes (Signed)
   Subjective:    Patient ID: Daniel Crawford, male    DOB: Mar 15, 1956, 65 y.o.   MRN: [id number]  HPI  65 year old ex-smoker for follow-up of dyspnea He smoked 20 pack years before quitting in 1998   Chief Complaint  Patient presents with   Follow-up    Breathing is doing well overall. He is using his albuterol 2-3 x per wk on average. Did not complete HST.    We reviewed the work-up following his initial visit for dyspnea including chest x-ray, CT chest, PFTs and echo. He has used albuterol since PFTs showed significant bronchodilator response.  Albuterol helps him do more, he uses this prior to activity Came to pick up home sleep test machine but felt like he would have a panic attack since he has significant nocturia and did not want to do the sleep test   Significant tests/ events reviewed  CT chest wo con 01/2020 >> no nodules, no ILD  PFTs 05/2020 >> moderate restriction, ratio 73, FEV1 57%, FVC 59% but improved by 12% with bronchodilator, DLCO 70% Echo 08/2020 hyperdynamic LV, EF 70%, mild LVH  Review of Systems neg for any significant sore throat, dysphagia, itching, sneezing, nasal congestion or excess/ purulent secretions, fever, chills, sweats, unintended wt loss, pleuritic or exertional cp, hempoptysis, orthopnea pnd or change in chronic leg swelling. Also denies presyncope, palpitations, heartburn, abdominal pain, nausea, vomiting, diarrhea or change in bowel or urinary habits, dysuria,hematuria, rash, arthralgias, visual complaints, headache, numbness weakness or ataxia.     Objective:   Physical Exam  Gen. Pleasant, obese, in no distress, normal affect ENT - no pallor,icterus, no post nasal drip, class 2-3 airway Neck: No JVD, no thyromegaly, no carotid bruits Lungs: no use of accessory muscles, no dullness to percussion, decreased without rales or rhonchi  Cardiovascular: Rhythm regular, heart sounds  normal, no murmurs or gallops, no peripheral edema Abdomen: soft and  non-tender, no hepatosplenomegaly, BS normal. Musculoskeletal: No deformities, no cyanosis or clubbing Neuro:  alert, non focal, no tremors        Assessment & Plan:

## 2020-10-17 NOTE — Assessment & Plan Note (Signed)
Some reversibility on PFTs and subjective improvement with albuterol so we will continue. But do feel that his dyspnea is mainly related to weight and deconditioning and fluid issues

## 2020-10-17 NOTE — Patient Instructions (Signed)
Refills on albuterol as needed Home Sleep study is recommended Referral to lung cancer screening program

## 2020-11-04 DIAGNOSIS — R0602 Shortness of breath: Secondary | ICD-10-CM | POA: Diagnosis not present

## 2020-11-04 DIAGNOSIS — Z86010 Personal history of colonic polyps: Secondary | ICD-10-CM | POA: Diagnosis not present

## 2020-11-04 DIAGNOSIS — K7460 Unspecified cirrhosis of liver: Secondary | ICD-10-CM | POA: Diagnosis not present

## 2020-11-14 DIAGNOSIS — D696 Thrombocytopenia, unspecified: Secondary | ICD-10-CM | POA: Diagnosis not present

## 2020-11-14 DIAGNOSIS — R188 Other ascites: Secondary | ICD-10-CM | POA: Diagnosis not present

## 2020-11-14 DIAGNOSIS — N182 Chronic kidney disease, stage 2 (mild): Secondary | ICD-10-CM | POA: Diagnosis not present

## 2020-11-14 DIAGNOSIS — Z79899 Other long term (current) drug therapy: Secondary | ICD-10-CM | POA: Diagnosis not present

## 2020-11-14 DIAGNOSIS — E039 Hypothyroidism, unspecified: Secondary | ICD-10-CM | POA: Diagnosis not present

## 2020-11-14 DIAGNOSIS — K7460 Unspecified cirrhosis of liver: Secondary | ICD-10-CM | POA: Diagnosis not present

## 2020-12-16 DIAGNOSIS — R1010 Upper abdominal pain, unspecified: Secondary | ICD-10-CM | POA: Diagnosis not present

## 2020-12-16 DIAGNOSIS — Z79899 Other long term (current) drug therapy: Secondary | ICD-10-CM | POA: Diagnosis not present

## 2020-12-16 DIAGNOSIS — R82998 Other abnormal findings in urine: Secondary | ICD-10-CM | POA: Diagnosis not present

## 2020-12-28 ENCOUNTER — Other Ambulatory Visit: Payer: Self-pay | Admitting: Pulmonary Disease

## 2021-01-03 DIAGNOSIS — Z125 Encounter for screening for malignant neoplasm of prostate: Secondary | ICD-10-CM | POA: Diagnosis not present

## 2021-01-03 DIAGNOSIS — R300 Dysuria: Secondary | ICD-10-CM | POA: Diagnosis not present

## 2021-01-03 DIAGNOSIS — N390 Urinary tract infection, site not specified: Secondary | ICD-10-CM | POA: Diagnosis not present

## 2021-01-23 ENCOUNTER — Other Ambulatory Visit: Payer: Self-pay | Admitting: Gastroenterology

## 2021-01-23 ENCOUNTER — Other Ambulatory Visit (HOSPITAL_COMMUNITY): Payer: Self-pay | Admitting: Gastroenterology

## 2021-01-23 DIAGNOSIS — K7460 Unspecified cirrhosis of liver: Secondary | ICD-10-CM

## 2021-02-01 ENCOUNTER — Ambulatory Visit (HOSPITAL_COMMUNITY)
Admission: RE | Admit: 2021-02-01 | Discharge: 2021-02-01 | Disposition: A | Discharge status: Home / Self Care | Payer: Medicare HMO | Source: Ambulatory Visit | Attending: Gastroenterology | Admitting: Gastroenterology

## 2021-02-01 ENCOUNTER — Other Ambulatory Visit: Payer: Self-pay

## 2021-02-01 DIAGNOSIS — K7460 Unspecified cirrhosis of liver: Secondary | ICD-10-CM | POA: Insufficient documentation

## 2021-02-20 DIAGNOSIS — D696 Thrombocytopenia, unspecified: Secondary | ICD-10-CM | POA: Diagnosis not present

## 2021-02-20 DIAGNOSIS — E039 Hypothyroidism, unspecified: Secondary | ICD-10-CM | POA: Diagnosis not present

## 2021-02-20 DIAGNOSIS — R0602 Shortness of breath: Secondary | ICD-10-CM | POA: Diagnosis not present

## 2021-02-20 DIAGNOSIS — R188 Other ascites: Secondary | ICD-10-CM | POA: Diagnosis not present

## 2021-02-20 DIAGNOSIS — K7290 Hepatic failure, unspecified without coma: Secondary | ICD-10-CM | POA: Diagnosis not present

## 2021-02-20 DIAGNOSIS — R0600 Dyspnea, unspecified: Secondary | ICD-10-CM | POA: Diagnosis not present

## 2021-02-20 DIAGNOSIS — G8929 Other chronic pain: Secondary | ICD-10-CM | POA: Diagnosis not present

## 2021-02-20 DIAGNOSIS — R609 Edema, unspecified: Secondary | ICD-10-CM | POA: Diagnosis not present

## 2021-02-20 DIAGNOSIS — K7460 Unspecified cirrhosis of liver: Secondary | ICD-10-CM | POA: Diagnosis not present

## 2021-02-20 DIAGNOSIS — Z79899 Other long term (current) drug therapy: Secondary | ICD-10-CM | POA: Diagnosis not present

## 2021-02-20 DIAGNOSIS — N182 Chronic kidney disease, stage 2 (mild): Secondary | ICD-10-CM | POA: Diagnosis not present

## 2021-03-02 DIAGNOSIS — Z23 Encounter for immunization: Secondary | ICD-10-CM | POA: Diagnosis not present

## 2021-03-13 ENCOUNTER — Other Ambulatory Visit: Payer: Self-pay | Admitting: Gastroenterology

## 2021-03-13 ENCOUNTER — Other Ambulatory Visit (HOSPITAL_COMMUNITY): Payer: Self-pay | Admitting: Gastroenterology

## 2021-03-13 DIAGNOSIS — Z86010 Personal history of colonic polyps: Secondary | ICD-10-CM | POA: Diagnosis not present

## 2021-03-13 DIAGNOSIS — K7460 Unspecified cirrhosis of liver: Secondary | ICD-10-CM | POA: Diagnosis not present

## 2021-03-13 DIAGNOSIS — R140 Abdominal distension (gaseous): Secondary | ICD-10-CM | POA: Diagnosis not present

## 2021-03-22 ENCOUNTER — Other Ambulatory Visit: Payer: Self-pay

## 2021-03-22 ENCOUNTER — Ambulatory Visit (HOSPITAL_COMMUNITY)
Admission: RE | Admit: 2021-03-22 | Discharge: 2021-03-22 | Disposition: A | Discharge status: Home / Self Care | Payer: Medicare HMO | Source: Ambulatory Visit | Attending: Gastroenterology | Admitting: Gastroenterology

## 2021-03-22 DIAGNOSIS — K7460 Unspecified cirrhosis of liver: Secondary | ICD-10-CM | POA: Insufficient documentation

## 2021-03-22 DIAGNOSIS — K7581 Nonalcoholic steatohepatitis (NASH): Secondary | ICD-10-CM | POA: Diagnosis not present

## 2021-03-22 DIAGNOSIS — Z9049 Acquired absence of other specified parts of digestive tract: Secondary | ICD-10-CM | POA: Diagnosis not present

## 2021-03-23 DIAGNOSIS — R079 Chest pain, unspecified: Secondary | ICD-10-CM | POA: Diagnosis not present

## 2021-03-23 DIAGNOSIS — R0602 Shortness of breath: Secondary | ICD-10-CM | POA: Diagnosis not present

## 2021-03-23 DIAGNOSIS — Z20822 Contact with and (suspected) exposure to covid-19: Secondary | ICD-10-CM | POA: Diagnosis not present

## 2021-03-23 DIAGNOSIS — R188 Other ascites: Secondary | ICD-10-CM | POA: Diagnosis not present

## 2021-03-23 DIAGNOSIS — K449 Diaphragmatic hernia without obstruction or gangrene: Secondary | ICD-10-CM | POA: Diagnosis not present

## 2021-03-23 DIAGNOSIS — R1013 Epigastric pain: Secondary | ICD-10-CM | POA: Diagnosis not present

## 2021-03-23 DIAGNOSIS — R0789 Other chest pain: Secondary | ICD-10-CM | POA: Diagnosis not present

## 2021-03-23 DIAGNOSIS — N200 Calculus of kidney: Secondary | ICD-10-CM | POA: Diagnosis not present

## 2021-03-23 DIAGNOSIS — R0600 Dyspnea, unspecified: Secondary | ICD-10-CM | POA: Diagnosis not present

## 2021-03-24 IMAGING — US US ABDOMEN LIMITED RUQ/ASCITES
1 series · 14 of 25 positions shown · non-contrast
Comparison: March 28, 2020 abdominal ultrasound

CLINICAL DATA: Hepatic cirrhosis

EXAM:
ULTRASOUND ABDOMEN LIMITED RIGHT UPPER QUADRANT

[Series 1: us abdomen limited ruq (liver/gb) · 14 of 70 slices shown]
[im 1/70]
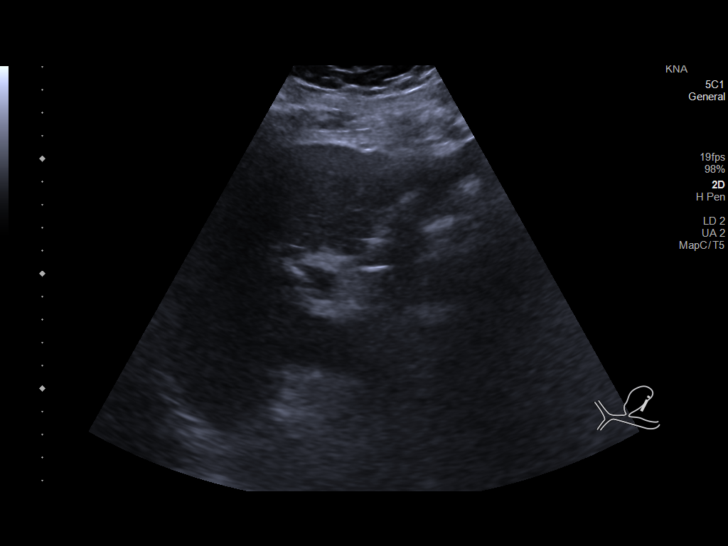
[im 6/70]
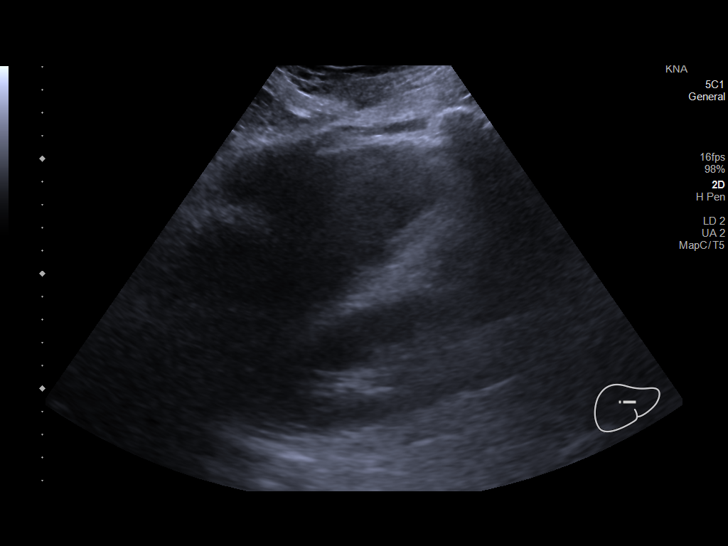
[im 12/70]
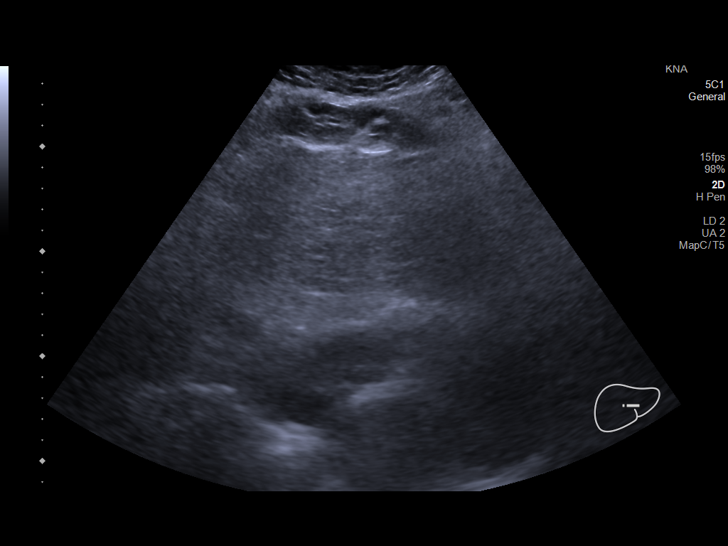
[im 18/70]
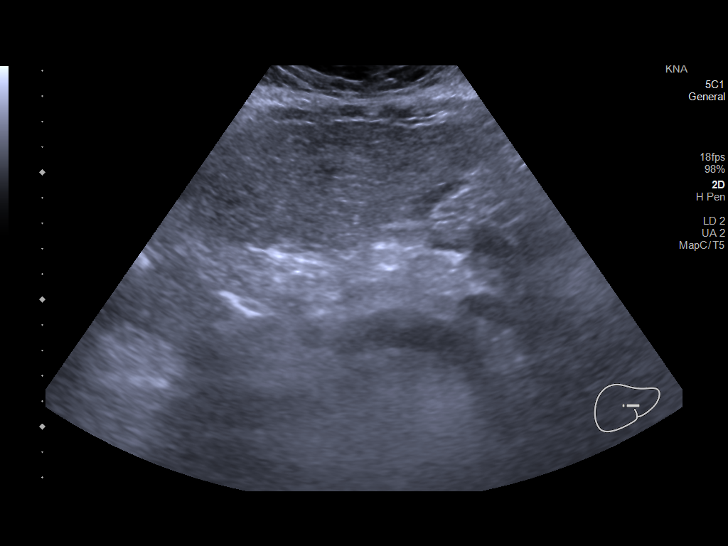
[im 24/70]
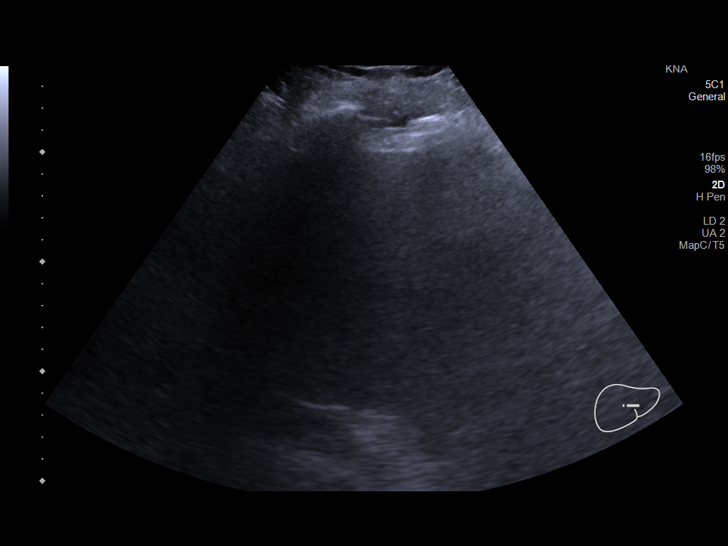
[im 26/70]
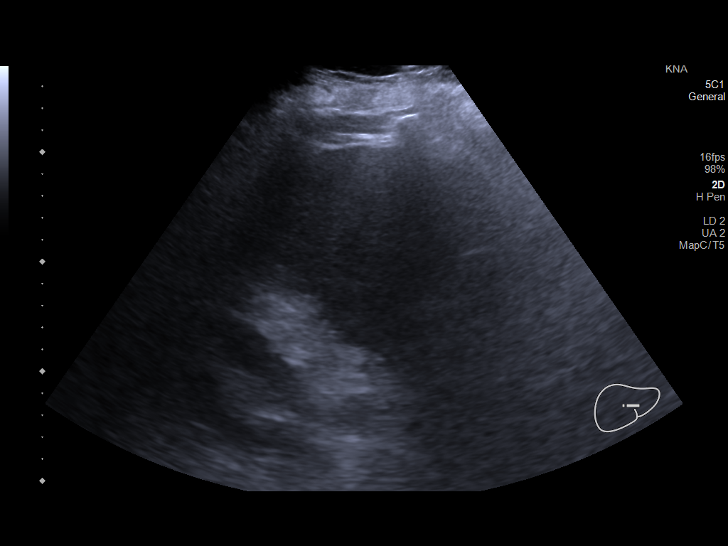
[im 32/70]
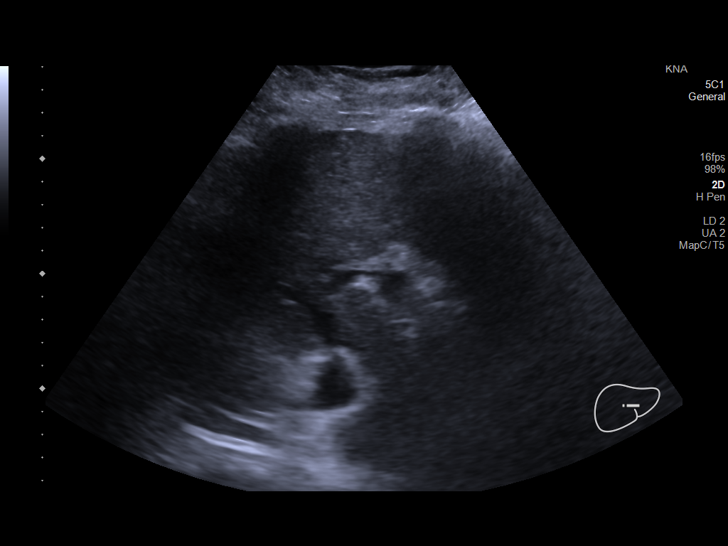
[im 38/70]
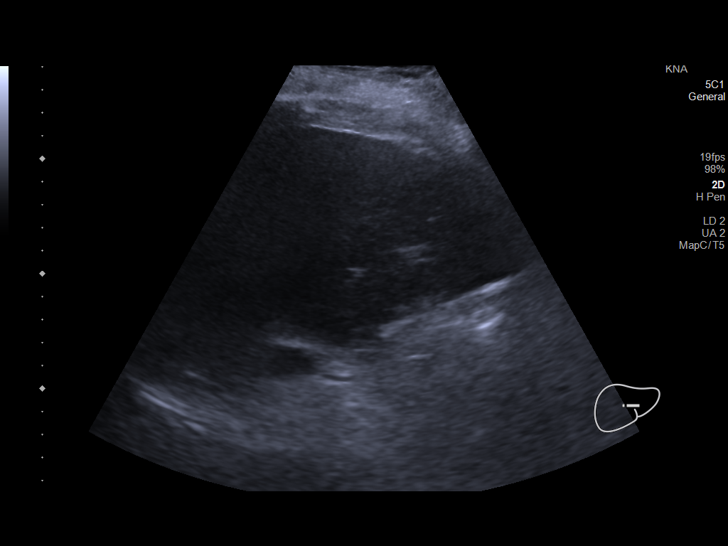
[im 44/70]
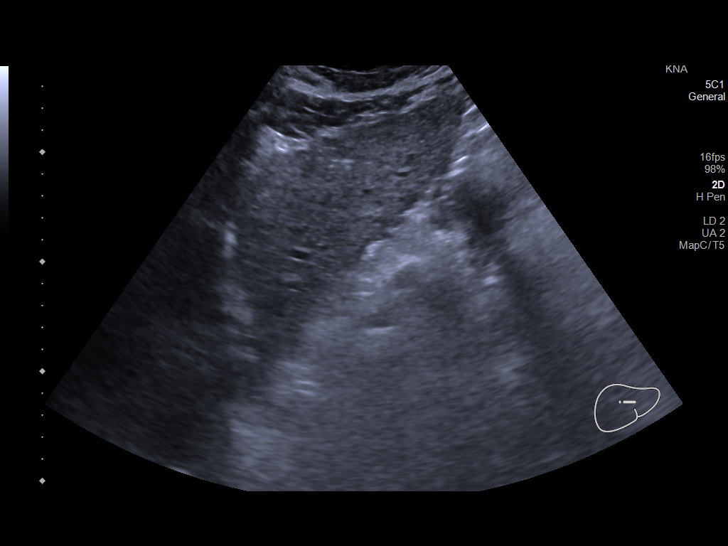
[im 47/70]
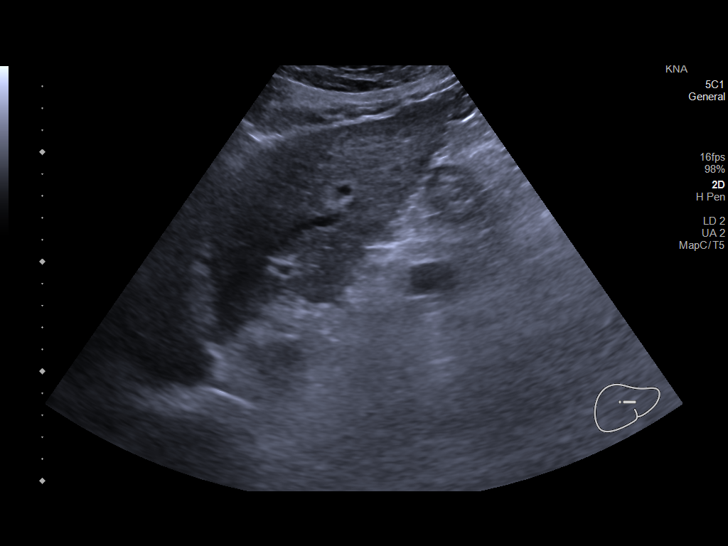
[im 52/70]
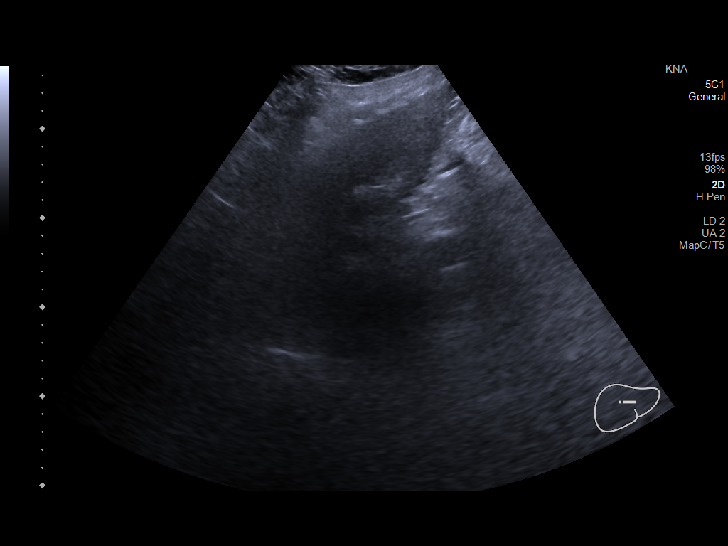
[im 58/70]
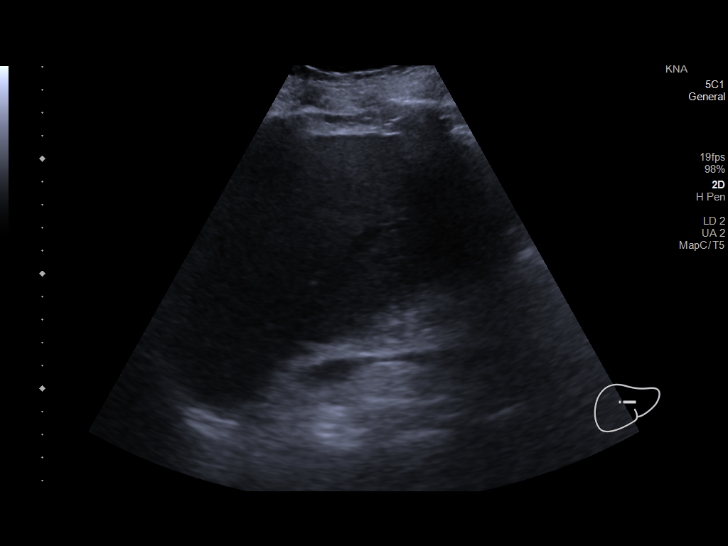
[im 64/70]
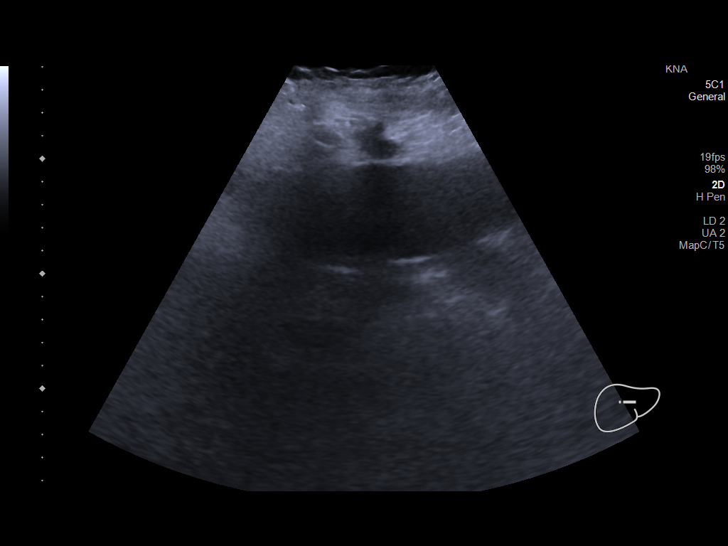
[im 70/70]
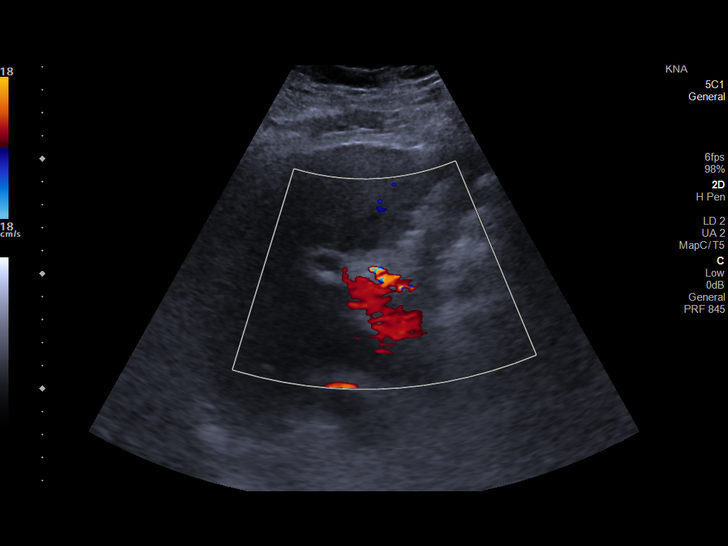

[14 of 25 positions shown; findings below may reference images not displayed]

FINDINGS: Gallbladder:

Surgically absent.

Common bile duct:

Diameter: 5 mm. No intrahepatic or extrahepatic biliary duct
dilatation

Liver:

No focal lesion identified. Liver contour is nodular. The liver
echogenicity is somewhat coarsened and overall increased. Portal
vein is patent on color Doppler imaging with normal direction of
blood flow towards the liver.

Other: None.
IMPRESSION: 1. Liver contour remains nodular with a somewhat coarsened increased
liver echogenicity, findings indicative of hepatic cirrhosis. No
focal liver lesions are evident. Note that the sensitivity of
ultrasound for detection of focal liver lesions is diminished in
this circumstance.

2.  Gallbladder absent.

## 2021-03-26 DIAGNOSIS — K7581 Nonalcoholic steatohepatitis (NASH): Secondary | ICD-10-CM | POA: Diagnosis not present

## 2021-03-26 DIAGNOSIS — U071 COVID-19: Secondary | ICD-10-CM | POA: Diagnosis not present

## 2021-03-26 DIAGNOSIS — E1122 Type 2 diabetes mellitus with diabetic chronic kidney disease: Secondary | ICD-10-CM | POA: Diagnosis not present

## 2021-03-26 DIAGNOSIS — D696 Thrombocytopenia, unspecified: Secondary | ICD-10-CM | POA: Diagnosis not present

## 2021-03-26 DIAGNOSIS — G4733 Obstructive sleep apnea (adult) (pediatric): Secondary | ICD-10-CM | POA: Diagnosis not present

## 2021-03-26 DIAGNOSIS — Z6841 Body Mass Index (BMI) 40.0 and over, adult: Secondary | ICD-10-CM | POA: Diagnosis not present

## 2021-03-26 DIAGNOSIS — L03116 Cellulitis of left lower limb: Secondary | ICD-10-CM | POA: Diagnosis not present

## 2021-03-26 DIAGNOSIS — I5032 Chronic diastolic (congestive) heart failure: Secondary | ICD-10-CM | POA: Diagnosis not present

## 2021-03-26 DIAGNOSIS — K769 Liver disease, unspecified: Secondary | ICD-10-CM | POA: Diagnosis not present

## 2021-03-26 DIAGNOSIS — R509 Fever, unspecified: Secondary | ICD-10-CM | POA: Diagnosis not present

## 2021-03-26 DIAGNOSIS — K7460 Unspecified cirrhosis of liver: Secondary | ICD-10-CM | POA: Diagnosis not present

## 2021-03-26 DIAGNOSIS — E876 Hypokalemia: Secondary | ICD-10-CM | POA: Diagnosis not present

## 2021-03-26 DIAGNOSIS — N183 Chronic kidney disease, stage 3 unspecified: Secondary | ICD-10-CM | POA: Diagnosis not present

## 2021-03-26 DIAGNOSIS — K7469 Other cirrhosis of liver: Secondary | ICD-10-CM | POA: Diagnosis not present

## 2021-03-26 DIAGNOSIS — M7989 Other specified soft tissue disorders: Secondary | ICD-10-CM | POA: Diagnosis not present

## 2021-03-26 DIAGNOSIS — E039 Hypothyroidism, unspecified: Secondary | ICD-10-CM | POA: Diagnosis not present

## 2021-03-26 DIAGNOSIS — N189 Chronic kidney disease, unspecified: Secondary | ICD-10-CM | POA: Diagnosis not present

## 2021-03-26 DIAGNOSIS — R600 Localized edema: Secondary | ICD-10-CM | POA: Diagnosis not present

## 2021-03-26 DIAGNOSIS — Z7901 Long term (current) use of anticoagulants: Secondary | ICD-10-CM | POA: Diagnosis not present

## 2021-03-26 DIAGNOSIS — I130 Hypertensive heart and chronic kidney disease with heart failure and stage 1 through stage 4 chronic kidney disease, or unspecified chronic kidney disease: Secondary | ICD-10-CM | POA: Diagnosis not present

## 2021-03-26 DIAGNOSIS — L539 Erythematous condition, unspecified: Secondary | ICD-10-CM | POA: Diagnosis not present

## 2021-03-26 DIAGNOSIS — Z792 Long term (current) use of antibiotics: Secondary | ICD-10-CM | POA: Diagnosis not present

## 2021-03-29 DIAGNOSIS — U071 COVID-19: Secondary | ICD-10-CM | POA: Diagnosis not present

## 2021-03-29 DIAGNOSIS — E039 Hypothyroidism, unspecified: Secondary | ICD-10-CM | POA: Diagnosis not present

## 2021-03-29 DIAGNOSIS — E876 Hypokalemia: Secondary | ICD-10-CM | POA: Diagnosis not present

## 2021-03-29 DIAGNOSIS — G4733 Obstructive sleep apnea (adult) (pediatric): Secondary | ICD-10-CM | POA: Diagnosis not present

## 2021-03-29 DIAGNOSIS — K7460 Unspecified cirrhosis of liver: Secondary | ICD-10-CM | POA: Diagnosis not present

## 2021-03-29 DIAGNOSIS — D696 Thrombocytopenia, unspecified: Secondary | ICD-10-CM | POA: Diagnosis not present

## 2021-03-29 DIAGNOSIS — M79605 Pain in left leg: Secondary | ICD-10-CM | POA: Diagnosis not present

## 2021-03-29 DIAGNOSIS — E1149 Type 2 diabetes mellitus with other diabetic neurological complication: Secondary | ICD-10-CM | POA: Diagnosis not present

## 2021-03-29 DIAGNOSIS — L03116 Cellulitis of left lower limb: Secondary | ICD-10-CM | POA: Diagnosis not present

## 2021-03-29 DIAGNOSIS — M7989 Other specified soft tissue disorders: Secondary | ICD-10-CM | POA: Diagnosis not present

## 2021-03-29 DIAGNOSIS — I5032 Chronic diastolic (congestive) heart failure: Secondary | ICD-10-CM | POA: Diagnosis not present

## 2021-03-29 DIAGNOSIS — E1122 Type 2 diabetes mellitus with diabetic chronic kidney disease: Secondary | ICD-10-CM | POA: Diagnosis not present

## 2021-04-01 ENCOUNTER — Other Ambulatory Visit: Payer: Self-pay | Admitting: Pulmonary Disease

## 2021-04-11 DIAGNOSIS — G8929 Other chronic pain: Secondary | ICD-10-CM | POA: Diagnosis not present

## 2021-04-11 DIAGNOSIS — E039 Hypothyroidism, unspecified: Secondary | ICD-10-CM | POA: Diagnosis not present

## 2021-04-11 DIAGNOSIS — R0602 Shortness of breath: Secondary | ICD-10-CM | POA: Diagnosis not present

## 2021-04-11 DIAGNOSIS — R82998 Other abnormal findings in urine: Secondary | ICD-10-CM | POA: Diagnosis not present

## 2021-04-11 DIAGNOSIS — N182 Chronic kidney disease, stage 2 (mild): Secondary | ICD-10-CM | POA: Diagnosis not present

## 2021-04-11 DIAGNOSIS — K7460 Unspecified cirrhosis of liver: Secondary | ICD-10-CM | POA: Diagnosis not present

## 2021-04-11 DIAGNOSIS — Z79899 Other long term (current) drug therapy: Secondary | ICD-10-CM | POA: Diagnosis not present

## 2021-04-11 DIAGNOSIS — R609 Edema, unspecified: Secondary | ICD-10-CM | POA: Diagnosis not present

## 2021-04-11 DIAGNOSIS — R188 Other ascites: Secondary | ICD-10-CM | POA: Diagnosis not present

## 2021-04-11 DIAGNOSIS — E876 Hypokalemia: Secondary | ICD-10-CM | POA: Diagnosis not present

## 2021-04-11 DIAGNOSIS — R0600 Dyspnea, unspecified: Secondary | ICD-10-CM | POA: Diagnosis not present

## 2021-04-17 DIAGNOSIS — R7989 Other specified abnormal findings of blood chemistry: Secondary | ICD-10-CM | POA: Diagnosis not present

## 2021-04-17 DIAGNOSIS — N179 Acute kidney failure, unspecified: Secondary | ICD-10-CM | POA: Diagnosis not present

## 2021-04-17 DIAGNOSIS — E8770 Fluid overload, unspecified: Secondary | ICD-10-CM | POA: Diagnosis not present

## 2021-04-17 DIAGNOSIS — K7460 Unspecified cirrhosis of liver: Secondary | ICD-10-CM | POA: Diagnosis not present

## 2021-04-18 ENCOUNTER — Emergency Department (HOSPITAL_COMMUNITY): Payer: Medicare HMO

## 2021-04-18 ENCOUNTER — Inpatient Hospital Stay (HOSPITAL_COMMUNITY)
Admission: EM | Admit: 2021-04-18 | Discharge: 2021-04-23 | DRG: 641 | Disposition: A | Discharge status: Home / Self Care | Payer: Medicare HMO | Attending: Internal Medicine | Admitting: Internal Medicine

## 2021-04-18 ENCOUNTER — Encounter (HOSPITAL_COMMUNITY): Payer: Self-pay | Admitting: *Deleted

## 2021-04-18 DIAGNOSIS — D72819 Decreased white blood cell count, unspecified: Secondary | ICD-10-CM | POA: Diagnosis present

## 2021-04-18 DIAGNOSIS — Z800 Family history of malignant neoplasm of digestive organs: Secondary | ICD-10-CM

## 2021-04-18 DIAGNOSIS — Z86010 Personal history of colonic polyps: Secondary | ICD-10-CM

## 2021-04-18 DIAGNOSIS — R1031 Right lower quadrant pain: Secondary | ICD-10-CM

## 2021-04-18 DIAGNOSIS — D696 Thrombocytopenia, unspecified: Secondary | ICD-10-CM | POA: Diagnosis not present

## 2021-04-18 DIAGNOSIS — F32A Depression, unspecified: Secondary | ICD-10-CM | POA: Diagnosis present

## 2021-04-18 DIAGNOSIS — E876 Hypokalemia: Principal | ICD-10-CM

## 2021-04-18 DIAGNOSIS — I8510 Secondary esophageal varices without bleeding: Secondary | ICD-10-CM | POA: Diagnosis present

## 2021-04-18 DIAGNOSIS — Z20822 Contact with and (suspected) exposure to covid-19: Secondary | ICD-10-CM | POA: Diagnosis present

## 2021-04-18 DIAGNOSIS — Z79899 Other long term (current) drug therapy: Secondary | ICD-10-CM

## 2021-04-18 DIAGNOSIS — K7581 Nonalcoholic steatohepatitis (NASH): Secondary | ICD-10-CM | POA: Diagnosis present

## 2021-04-18 DIAGNOSIS — K766 Portal hypertension: Secondary | ICD-10-CM | POA: Diagnosis present

## 2021-04-18 DIAGNOSIS — Z9049 Acquired absence of other specified parts of digestive tract: Secondary | ICD-10-CM

## 2021-04-18 DIAGNOSIS — I864 Gastric varices: Secondary | ICD-10-CM | POA: Diagnosis present

## 2021-04-18 DIAGNOSIS — Z79890 Hormone replacement therapy (postmenopausal): Secondary | ICD-10-CM

## 2021-04-18 DIAGNOSIS — Z6841 Body Mass Index (BMI) 40.0 and over, adult: Secondary | ICD-10-CM

## 2021-04-18 DIAGNOSIS — I5032 Chronic diastolic (congestive) heart failure: Secondary | ICD-10-CM | POA: Diagnosis present

## 2021-04-18 DIAGNOSIS — G4733 Obstructive sleep apnea (adult) (pediatric): Secondary | ICD-10-CM | POA: Diagnosis present

## 2021-04-18 DIAGNOSIS — D61818 Other pancytopenia: Secondary | ICD-10-CM | POA: Diagnosis present

## 2021-04-18 DIAGNOSIS — R9431 Abnormal electrocardiogram [ECG] [EKG]: Secondary | ICD-10-CM | POA: Diagnosis not present

## 2021-04-18 DIAGNOSIS — K7460 Unspecified cirrhosis of liver: Secondary | ICD-10-CM | POA: Diagnosis present

## 2021-04-18 DIAGNOSIS — T501X5A Adverse effect of loop [high-ceiling] diuretics, initial encounter: Secondary | ICD-10-CM | POA: Diagnosis present

## 2021-04-18 DIAGNOSIS — Z87891 Personal history of nicotine dependence: Secondary | ICD-10-CM

## 2021-04-18 DIAGNOSIS — K219 Gastro-esophageal reflux disease without esophagitis: Secondary | ICD-10-CM | POA: Diagnosis present

## 2021-04-18 DIAGNOSIS — E039 Hypothyroidism, unspecified: Secondary | ICD-10-CM | POA: Diagnosis present

## 2021-04-18 DIAGNOSIS — N1831 Chronic kidney disease, stage 3a: Secondary | ICD-10-CM | POA: Diagnosis present

## 2021-04-18 DIAGNOSIS — R188 Other ascites: Secondary | ICD-10-CM | POA: Diagnosis present

## 2021-04-18 HISTORY — DX: Nonalcoholic steatohepatitis (NASH): K75.81

## 2021-04-18 LAB — COMPREHENSIVE METABOLIC PANEL
ALT: 19 U/L (ref 0–44)
AST: 48 U/L — ABNORMAL HIGH (ref 15–41)
Albumin: 3 g/dL — ABNORMAL LOW (ref 3.5–5.0)
Alkaline Phosphatase: 76 U/L (ref 38–126)
Anion gap: 9 (ref 5–15)
BUN: 14 mg/dL (ref 8–23)
CO2: 37 mmol/L — ABNORMAL HIGH (ref 22–32)
Calcium: 8.2 mg/dL — ABNORMAL LOW (ref 8.9–10.3)
Chloride: 87 mmol/L — ABNORMAL LOW (ref 98–111)
Creatinine, Ser: 1.19 mg/dL (ref 0.61–1.24)
GFR, Estimated: >60 mL/min (ref >60)
Glucose, Bld: 135 mg/dL — ABNORMAL HIGH (ref 70–99)
Potassium: 2.2 mmol/L — CL (ref 3.5–5.1)
Sodium: 133 mmol/L — ABNORMAL LOW (ref 135–145)
Total Bilirubin: 2 mg/dL — ABNORMAL HIGH (ref 0.3–1.2)
Total Protein: 6.7 g/dL (ref 6.5–8.1)

## 2021-04-18 LAB — CBC WITH DIFFERENTIAL/PLATELET
Abs Immature Granulocytes: 0 10*3/uL (ref 0.00–0.07)
Basophils Absolute: 0 10*3/uL (ref 0.0–0.1)
Basophils Relative: 1 %
Eosinophils Absolute: 0.2 10*3/uL (ref 0.0–0.5)
Eosinophils Relative: 6 %
HCT: 35.4 % — ABNORMAL LOW (ref 39.0–52.0)
Hemoglobin: 11.7 g/dL — ABNORMAL LOW (ref 13.0–17.0)
Immature Granulocytes: 0 %
Lymphocytes Relative: 30 %
Lymphs Abs: 0.8 10*3/uL (ref 0.7–4.0)
MCH: 28.5 pg (ref 26.0–34.0)
MCHC: 33.1 g/dL (ref 30.0–36.0)
MCV: 86.3 fL (ref 80.0–100.0)
Monocytes Absolute: 0.3 10*3/uL (ref 0.1–1.0)
Monocytes Relative: 13 %
Neutro Abs: 1.4 10*3/uL — ABNORMAL LOW (ref 1.7–7.7)
Neutrophils Relative %: 50 %
Platelets: 54 10*3/uL — ABNORMAL LOW (ref 150–400)
RBC: 4.1 MIL/uL — ABNORMAL LOW (ref 4.22–5.81)
RDW: 16.4 % — ABNORMAL HIGH (ref 11.5–15.5)
WBC: 2.7 10*3/uL — ABNORMAL LOW (ref 4.0–10.5)
nRBC: 0 % (ref 0.0–0.2)

## 2021-04-18 LAB — URINALYSIS, ROUTINE W REFLEX MICROSCOPIC
Bilirubin Urine: NEGATIVE
Glucose, UA: NEGATIVE mg/dL
Hgb urine dipstick: NEGATIVE
Ketones, ur: NEGATIVE mg/dL
Leukocytes,Ua: NEGATIVE
Nitrite: NEGATIVE
Protein, ur: 30 mg/dL — AB
Specific Gravity, Urine: 1.023 (ref 1.005–1.030)
pH: 6 (ref 5.0–8.0)

## 2021-04-18 LAB — RESP PANEL BY RT-PCR (FLU A&B, COVID) ARPGX2
Influenza A by PCR: NEGATIVE
Influenza B by PCR: NEGATIVE
SARS Coronavirus 2 by RT PCR: NEGATIVE

## 2021-04-18 LAB — MAGNESIUM: Magnesium: 1.8 mg/dL (ref 1.7–2.4)

## 2021-04-18 LAB — LIPASE, BLOOD: Lipase: 48 U/L (ref 11–51)

## 2021-04-18 MED ORDER — PROCHLORPERAZINE EDISYLATE 10 MG/2ML IJ SOLN
10.0000 mg | Freq: Four times a day (QID) | INTRAMUSCULAR | Status: DC | PRN
  Administered 2021-04-22: 10 mg via INTRAVENOUS
  Filled 2021-04-18: qty 2

## 2021-04-18 MED ORDER — ACETAMINOPHEN 325 MG PO TABS: 650.0000 mg | ORAL_TABLET | Freq: Four times a day (QID) | ORAL | Status: DC | PRN

## 2021-04-18 MED ORDER — SERTRALINE HCL 50 MG PO TABS
25.0000 mg | ORAL_TABLET | Freq: Every day | ORAL | Status: DC
  Administered 2021-04-18 – 2021-04-22 (×5): 25 mg via ORAL
  Filled 2021-04-18 (×5): qty 1

## 2021-04-18 MED ORDER — ONDANSETRON HCL 4 MG/2ML IJ SOLN
4.0000 mg | Freq: Once | INTRAMUSCULAR | Status: AC
  Administered 2021-04-18: 17:00:00 4 mg via INTRAVENOUS
  Filled 2021-04-18: qty 2

## 2021-04-18 MED ORDER — MORPHINE SULFATE (PF) 4 MG/ML IV SOLN
4.0000 mg | Freq: Once | INTRAVENOUS | Status: AC
  Administered 2021-04-18: 19:00:00 4 mg via INTRAVENOUS
  Filled 2021-04-18: qty 1

## 2021-04-18 MED ORDER — CARVEDILOL 12.5 MG PO TABS
12.5000 mg | ORAL_TABLET | Freq: Two times a day (BID) | ORAL | Status: DC
  Administered 2021-04-18 – 2021-04-22 (×9): 12.5 mg via ORAL
  Filled 2021-04-18 (×10): qty 1

## 2021-04-18 MED ORDER — IOHEXOL 300 MG/ML  SOLN
100.0000 mL | Freq: Once | INTRAMUSCULAR | Status: AC | PRN
  Administered 2021-04-18: 19:00:00 100 mL via INTRAVENOUS

## 2021-04-18 MED ORDER — MORPHINE SULFATE (PF) 2 MG/ML IV SOLN
2.0000 mg | INTRAVENOUS | Status: DC | PRN
  Administered 2021-04-20 – 2021-04-22 (×5): 2 mg via INTRAVENOUS
  Filled 2021-04-18 (×5): qty 1

## 2021-04-18 MED ORDER — PANTOPRAZOLE SODIUM 40 MG PO TBEC
40.0000 mg | DELAYED_RELEASE_TABLET | Freq: Every day | ORAL | Status: DC
  Administered 2021-04-18 – 2021-04-23 (×6): 40 mg via ORAL
  Filled 2021-04-18 (×6): qty 1

## 2021-04-18 MED ORDER — CARVEDILOL 12.5 MG PO TABS: 12.5000 mg | ORAL_TABLET | Freq: Two times a day (BID) | ORAL | Status: DC

## 2021-04-18 MED ORDER — HYDROXYZINE PAMOATE 50 MG PO CAPS
50.0000 mg | ORAL_CAPSULE | Freq: Three times a day (TID) | ORAL | Status: DC | PRN
  Filled 2021-04-18: qty 1

## 2021-04-18 MED ORDER — FUROSEMIDE 80 MG PO TABS
80.0000 mg | ORAL_TABLET | Freq: Every day | ORAL | Status: DC
  Administered 2021-04-18 – 2021-04-19 (×2): 80 mg via ORAL
  Filled 2021-04-18: qty 2
  Filled 2021-04-18: qty 1

## 2021-04-18 MED ORDER — ACETAMINOPHEN 650 MG RE SUPP: 650.0000 mg | Freq: Four times a day (QID) | RECTAL | Status: DC | PRN

## 2021-04-18 MED ORDER — TRAZODONE HCL 50 MG PO TABS
50.0000 mg | ORAL_TABLET | Freq: Every day | ORAL | Status: DC
  Administered 2021-04-18 – 2021-04-22 (×5): 50 mg via ORAL
  Filled 2021-04-18 (×5): qty 1

## 2021-04-18 MED ORDER — LEVOTHYROXINE SODIUM 75 MCG PO TABS
175.0000 ug | ORAL_TABLET | Freq: Every day | ORAL | Status: DC
  Administered 2021-04-19 – 2021-04-23 (×5): 175 ug via ORAL
  Filled 2021-04-18: qty 4
  Filled 2021-04-18 (×4): qty 1

## 2021-04-18 MED ORDER — OXYCODONE HCL 5 MG PO TABS
5.0000 mg | ORAL_TABLET | ORAL | Status: DC | PRN
  Administered 2021-04-19 – 2021-04-22 (×4): 5 mg via ORAL
  Filled 2021-04-18 (×5): qty 1

## 2021-04-18 MED ORDER — METOLAZONE 5 MG PO TABS
5.0000 mg | ORAL_TABLET | Freq: Every day | ORAL | Status: DC
  Administered 2021-04-19 – 2021-04-20 (×2): 5 mg via ORAL
  Filled 2021-04-18 (×3): qty 1

## 2021-04-18 MED ORDER — POTASSIUM CHLORIDE CRYS ER 20 MEQ PO TBCR
40.0000 meq | EXTENDED_RELEASE_TABLET | Freq: Once | ORAL | Status: AC
Start: 2021-04-18 — End: 2021-04-18
  Administered 2021-04-18: 19:00:00 40 meq via ORAL
  Filled 2021-04-18: qty 2

## 2021-04-18 MED ORDER — GABAPENTIN 100 MG PO CAPS
100.0000 mg | ORAL_CAPSULE | Freq: Every day | ORAL | Status: DC
  Administered 2021-04-18 – 2021-04-22 (×5): 100 mg via ORAL
  Filled 2021-04-18 (×5): qty 1

## 2021-04-18 MED ORDER — RIFAXIMIN 550 MG PO TABS
550.0000 mg | ORAL_TABLET | Freq: Two times a day (BID) | ORAL | Status: DC
Start: 2021-04-18 — End: 2021-04-23
  Administered 2021-04-18 – 2021-04-23 (×10): 550 mg via ORAL
  Filled 2021-04-18 (×10): qty 1

## 2021-04-18 MED ORDER — POTASSIUM CHLORIDE 10 MEQ/100ML IV SOLN
10.0000 meq | INTRAVENOUS | Status: AC
  Administered 2021-04-18 (×3): 10 meq via INTRAVENOUS
  Filled 2021-04-18 (×3): qty 100

## 2021-04-18 NOTE — ED Provider Notes (Addendum)
[facility] EMERGENCY DEPARTMENT Provider Note   CSN: [id number] Arrival date & time: 04/18/21  1546     History  Chief Complaint  Patient presents with   Abdominal Pain    Daniel Crawford is a 66 y.o. male with a history Mo significant for [name] induced liver cirrhosis, also history of obstructive sleep apnea presenting for evaluation of right sided abdominal pain which she states he has had in the past but has been more persistent and worsening over the past week.  He describes an aching quality pain which is worsened with certain movements but is also present at rest.  He denies fevers or chills, he has had nausea without emesis, denies any changes in his bowel habits, he chronically takes lactulose to avoid ammonia elevation and routinely has 3-4 bowel movements daily.  He reports having some abdominal distention several days ago which is still present but improved today.  He has been diagnosed with ascites in the past but has never needed paracentesis.  He has found no alleviators for symptoms.  The history is provided by the patient.      Home Medications Prior to Admission medications   Medication Sig Start Date End Date Taking? Authorizing Provider  Acetaminophen 500 MG capsule Take 1,000 mg by mouth every 6 (six) hours as needed for pain.   Yes [provider]  albuterol (VENTOLIN HFA) 108 (90 Base) MCG/ACT inhaler TAKE 2 PUFFS BY MOUTH EVERY 6 HOURS AS NEEDED FOR WHEEZE OR SHORTNESS OF BREATH Patient taking differently: Inhale 2 puffs into the lungs every 6 (six) hours as needed for shortness of breath. 12/28/20  Yes Rigoberto Noel, MD  ascorbic acid (VITAMIN C) 500 MG tablet Take by mouth. 03/30/21 03/30/22 Yes [provider]  carvedilol (COREG) 12.5 MG tablet Take 12.5 mg by mouth 2 (two) times daily with a meal.   Yes [provider]  Cholecalciferol 25 MCG (1000 UT) tablet Take by mouth. 03/30/21 04/29/21 Yes [provider]  furosemide (LASIX) 20 MG  tablet Take 80 mg by mouth daily.   Yes [provider]  gabapentin (NEURONTIN) 100 MG capsule Take 100 mg by mouth at bedtime. 02/20/21  Yes [provider]  hydrOXYzine (VISTARIL) 50 MG capsule Take 50 mg by mouth in the morning and at bedtime.   Yes [provider]  levothyroxine (SYNTHROID) 175 MCG tablet Take 175 mcg by mouth daily before breakfast.   Yes [provider]  metolazone (ZAROXOLYN) 5 MG tablet Take 5 mg by mouth daily.   Yes [provider]  pantoprazole (PROTONIX) 40 MG tablet Take 40 mg by mouth daily.   Yes [provider]  Potassium Chloride ER 20 MEQ TBCR Take 1 tablet by mouth 3 (three) times daily. 06/17/20  Yes [provider]  rifaximin (XIFAXAN) 550 MG TABS tablet Take 550 mg by mouth 2 (two) times daily.   Yes [provider]  sertraline (ZOLOFT) 25 MG tablet Take 25 mg by mouth at bedtime.   Yes [provider]  traZODone (DESYREL) 50 MG tablet Take 50 mg by mouth at bedtime.   Yes [provider]  zinc gluconate 50 MG tablet Take 1 tablet by mouth daily. 03/30/21 03/30/22 Yes [provider]  lactulose (CHRONULAC) 10 GM/15ML solution Take 15 mLs (10 g total) by mouth 3 (three) times daily. Patient not taking: Reported on 04/18/2021 10/17/16   Butch Penny, NP      Allergies    Patient  has no known allergies.    Review of Systems   Review of Systems  Constitutional:  Negative for chills and fever.  HENT:  Negative for congestion and sore throat.   Eyes: Negative.   Respiratory:  Negative for chest tightness and shortness of breath.   Cardiovascular:  Positive for leg swelling. Negative for chest pain.  Gastrointestinal:  Positive for abdominal distention, abdominal pain and nausea. Negative for vomiting.  Genitourinary: Negative.   Musculoskeletal:  Negative for arthralgias, joint swelling and neck pain.  Skin: Negative.  Negative for rash and wound.  Neurological:   Negative for dizziness, weakness, light-headedness, numbness and headaches.  Psychiatric/Behavioral: Negative.  Negative for confusion.   All other systems reviewed and are negative.  Physical Exam Updated Vital Signs BP 117/62    Pulse 74    Temp 98.8 F (37.1 C) (Oral)    Resp 13    Wt (!) 161.9 kg    SpO2 92%    BMI 45.84 kg/m  Physical Exam Vitals and nursing note reviewed.  Constitutional:      Appearance: He is well-developed.  HENT:     Head: Normocephalic and atraumatic.  Eyes:     Conjunctiva/sclera: Conjunctivae normal.  Cardiovascular:     Rate and Rhythm: Normal rate and regular rhythm.     Heart sounds: Normal heart sounds.  Pulmonary:     Effort: Pulmonary effort is normal.     Breath sounds: Normal breath sounds. No wheezing.  Abdominal:     General: Bowel sounds are normal. There is distension.     Palpations: Abdomen is soft. There is no fluid wave or pulsatile mass.     Tenderness: There is abdominal tenderness in the right upper quadrant and right lower quadrant. There is no guarding or rebound.  Musculoskeletal:        General: Normal range of motion.     Cervical back: Normal range of motion.     Right lower leg: Edema present.     Left lower leg: Edema present.  Skin:    General: Skin is warm and dry.  Neurological:     General: No focal deficit present.     Mental Status: He is alert.    ED Results / Procedures / Treatments   Labs (all labs ordered are listed, but only abnormal results are displayed) Labs Reviewed  CBC WITH DIFFERENTIAL/PLATELET - Abnormal; Notable for the following components:      Result Value   WBC 2.7 (*)    RBC 4.10 (*)    Hemoglobin 11.7 (*)    HCT 35.4 (*)    RDW 16.4 (*)    Platelets 54 (*)    Neutro Abs 1.4 (*)    All other components within normal limits  COMPREHENSIVE METABOLIC PANEL - Abnormal; Notable for the following components:   Sodium 133 (*)    Potassium 2.2 (*)    Chloride 87 (*)    CO2 37 (*)     Glucose, Bld 135 (*)    Calcium 8.2 (*)    Albumin 3.0 (*)    AST 48 (*)    Total Bilirubin 2.0 (*)    All other components within normal limits  URINALYSIS, ROUTINE W REFLEX MICROSCOPIC - Abnormal; Notable for the following components:   Color, Urine AMBER (*)    Protein, ur 30 (*)    Bacteria, UA RARE (*)    All other components within normal limits  LIPASE, BLOOD    EKG  None  Radiology No results found.  Procedures Procedures    Medications Ordered in ED Medications  morphine 4 MG/ML injection 4 mg (has no administration in time range)  potassium chloride SA (KLOR-CON M) CR tablet 40 mEq (has no administration in time range)  potassium chloride 10 mEq in 100 mL IVPB (has no administration in time range)  ondansetron (ZOFRAN) injection 4 mg (4 mg Intravenous Given 04/18/21 1630)    ED Course/ Medical Decision Making/ A&P Clinical Course as of 04/18/21 1836  Tue Apr 18, 2021  1823 Need to admit, hx cirrhosis, right sided abdominal pain, worse, nausea c/o v, some distention, decreased appetite, some distention, never been tapped for ascites before, bilateral LE edema, on diuretic -- pending CT, on lactulose 2/2 confusion. AO at this time. Admit for hypokalemia +/- CT [CP]    Clinical Course User Index [CP] Anselmo Pickler, PA-C                           Medical Decision Making Patient with [name] induced cirrhosis presenting with abdominal pain.  Nausea without emesis, afebrile.  No confusion.  No hematemesis.  Amount and/or Complexity of Data Reviewed Labs: ordered.    Details: Labs which have resulted have been reviewed, he has a low WBC count at 2.7, mild normocytic anemia at 11.7.  He has a low platelet count at 54, but relatively stable, his total bilirubin is 2.0 which is actually improved compared to priors,, other significant labs is a profound hypokalemia at 2.2. Radiology: ordered.    Details: Pending CT abdomen and pelvis at this  time.  Risk Prescription drug management. Decision regarding hospitalization.   CT imaging is negative for any acute intra-abdominal process.  He does have some mild ascites present.  He is actively receiving potassium replacement.  Discussed with Dr. Clearence Ped of the hospitalist service who accepts patient for admission.      Final Clinical Impression(s) / ED Diagnoses Final diagnoses:  Hypokalemia  Right lower quadrant abdominal pain  Thrombocytopenia [facility])    Rx / DC Orders ED Discharge Orders     None         Landis Martins 04/18/21 1836    Evalee Jefferson, PA-C 04/18/21 2000    Carmin Muskrat, MD 04/18/21 [phone]

## 2021-04-18 NOTE — ED Triage Notes (Signed)
LOW ABDOMINAL PAIN INTERMITTENT FOR QUITE SOMETIME

## 2021-04-19 ENCOUNTER — Other Ambulatory Visit: Payer: Self-pay

## 2021-04-19 DIAGNOSIS — Z6841 Body Mass Index (BMI) 40.0 and over, adult: Secondary | ICD-10-CM | POA: Diagnosis not present

## 2021-04-19 DIAGNOSIS — I8510 Secondary esophageal varices without bleeding: Secondary | ICD-10-CM | POA: Diagnosis not present

## 2021-04-19 DIAGNOSIS — R9431 Abnormal electrocardiogram [ECG] [EKG]: Secondary | ICD-10-CM | POA: Diagnosis present

## 2021-04-19 DIAGNOSIS — D696 Thrombocytopenia, unspecified: Secondary | ICD-10-CM

## 2021-04-19 DIAGNOSIS — D72819 Decreased white blood cell count, unspecified: Secondary | ICD-10-CM | POA: Diagnosis present

## 2021-04-19 DIAGNOSIS — E039 Hypothyroidism, unspecified: Secondary | ICD-10-CM | POA: Diagnosis not present

## 2021-04-19 DIAGNOSIS — Z79890 Hormone replacement therapy (postmenopausal): Secondary | ICD-10-CM | POA: Diagnosis not present

## 2021-04-19 DIAGNOSIS — T501X5A Adverse effect of loop [high-ceiling] diuretics, initial encounter: Secondary | ICD-10-CM | POA: Diagnosis present

## 2021-04-19 DIAGNOSIS — D61818 Other pancytopenia: Secondary | ICD-10-CM | POA: Diagnosis not present

## 2021-04-19 DIAGNOSIS — I130 Hypertensive heart and chronic kidney disease with heart failure and stage 1 through stage 4 chronic kidney disease, or unspecified chronic kidney disease: Secondary | ICD-10-CM | POA: Diagnosis not present

## 2021-04-19 DIAGNOSIS — R188 Other ascites: Secondary | ICD-10-CM | POA: Diagnosis present

## 2021-04-19 DIAGNOSIS — K219 Gastro-esophageal reflux disease without esophagitis: Secondary | ICD-10-CM | POA: Diagnosis present

## 2021-04-19 DIAGNOSIS — K7581 Nonalcoholic steatohepatitis (NASH): Secondary | ICD-10-CM | POA: Diagnosis present

## 2021-04-19 DIAGNOSIS — Z79899 Other long term (current) drug therapy: Secondary | ICD-10-CM | POA: Diagnosis not present

## 2021-04-19 DIAGNOSIS — E876 Hypokalemia: Secondary | ICD-10-CM | POA: Diagnosis not present

## 2021-04-19 DIAGNOSIS — F32A Depression, unspecified: Secondary | ICD-10-CM | POA: Diagnosis present

## 2021-04-19 DIAGNOSIS — K766 Portal hypertension: Secondary | ICD-10-CM | POA: Diagnosis not present

## 2021-04-19 DIAGNOSIS — Z87891 Personal history of nicotine dependence: Secondary | ICD-10-CM | POA: Diagnosis not present

## 2021-04-19 DIAGNOSIS — G4733 Obstructive sleep apnea (adult) (pediatric): Secondary | ICD-10-CM | POA: Diagnosis present

## 2021-04-19 DIAGNOSIS — Z86010 Personal history of colonic polyps: Secondary | ICD-10-CM | POA: Diagnosis not present

## 2021-04-19 DIAGNOSIS — I5032 Chronic diastolic (congestive) heart failure: Secondary | ICD-10-CM | POA: Diagnosis not present

## 2021-04-19 DIAGNOSIS — K7460 Unspecified cirrhosis of liver: Secondary | ICD-10-CM | POA: Diagnosis present

## 2021-04-19 DIAGNOSIS — Z800 Family history of malignant neoplasm of digestive organs: Secondary | ICD-10-CM | POA: Diagnosis not present

## 2021-04-19 DIAGNOSIS — Z9049 Acquired absence of other specified parts of digestive tract: Secondary | ICD-10-CM | POA: Diagnosis not present

## 2021-04-19 DIAGNOSIS — I864 Gastric varices: Secondary | ICD-10-CM | POA: Diagnosis present

## 2021-04-19 DIAGNOSIS — R109 Unspecified abdominal pain: Secondary | ICD-10-CM | POA: Diagnosis not present

## 2021-04-19 DIAGNOSIS — Z20822 Contact with and (suspected) exposure to covid-19: Secondary | ICD-10-CM | POA: Diagnosis not present

## 2021-04-19 DIAGNOSIS — N1831 Chronic kidney disease, stage 3a: Secondary | ICD-10-CM | POA: Diagnosis not present

## 2021-04-19 LAB — CBC WITH DIFFERENTIAL/PLATELET
Abs Immature Granulocytes: 0.01 10*3/uL (ref 0.00–0.07)
Basophils Absolute: 0 10*3/uL (ref 0.0–0.1)
Basophils Relative: 0 %
Eosinophils Absolute: 0.2 10*3/uL (ref 0.0–0.5)
Eosinophils Relative: 6 %
HCT: 33.9 % — ABNORMAL LOW (ref 39.0–52.0)
Hemoglobin: 11.5 g/dL — ABNORMAL LOW (ref 13.0–17.0)
Immature Granulocytes: 0 %
Lymphocytes Relative: 29 %
Lymphs Abs: 0.7 10*3/uL (ref 0.7–4.0)
MCH: 29.9 pg (ref 26.0–34.0)
MCHC: 33.9 g/dL (ref 30.0–36.0)
MCV: 88.1 fL (ref 80.0–100.0)
Monocytes Absolute: 0.3 10*3/uL (ref 0.1–1.0)
Monocytes Relative: 11 %
Neutro Abs: 1.3 10*3/uL — ABNORMAL LOW (ref 1.7–7.7)
Neutrophils Relative %: 54 %
Platelets: 56 10*3/uL — ABNORMAL LOW (ref 150–400)
RBC: 3.85 MIL/uL — ABNORMAL LOW (ref 4.22–5.81)
RDW: 16.7 % — ABNORMAL HIGH (ref 11.5–15.5)
WBC: 2.5 10*3/uL — ABNORMAL LOW (ref 4.0–10.5)
nRBC: 0 % (ref 0.0–0.2)

## 2021-04-19 LAB — BASIC METABOLIC PANEL
Anion gap: 9 (ref 5–15)
BUN: 12 mg/dL (ref 8–23)
CO2: 40 mmol/L — ABNORMAL HIGH (ref 22–32)
Calcium: 8.5 mg/dL — ABNORMAL LOW (ref 8.9–10.3)
Chloride: 85 mmol/L — ABNORMAL LOW (ref 98–111)
Creatinine, Ser: 1.33 mg/dL — ABNORMAL HIGH (ref 0.61–1.24)
GFR, Estimated: >60 mL/min (ref >60)
Glucose, Bld: 127 mg/dL — ABNORMAL HIGH (ref 70–99)
Potassium: 2.5 mmol/L — CL (ref 3.5–5.1)
Sodium: 134 mmol/L — ABNORMAL LOW (ref 135–145)

## 2021-04-19 LAB — COMPREHENSIVE METABOLIC PANEL
ALT: 20 U/L (ref 0–44)
AST: 48 U/L — ABNORMAL HIGH (ref 15–41)
Albumin: 3 g/dL — ABNORMAL LOW (ref 3.5–5.0)
Alkaline Phosphatase: 67 U/L (ref 38–126)
Anion gap: 6 (ref 5–15)
BUN: 13 mg/dL (ref 8–23)
CO2: 39 mmol/L — ABNORMAL HIGH (ref 22–32)
Calcium: 7.9 mg/dL — ABNORMAL LOW (ref 8.9–10.3)
Chloride: 89 mmol/L — ABNORMAL LOW (ref 98–111)
Creatinine, Ser: 1.34 mg/dL — ABNORMAL HIGH (ref 0.61–1.24)
GFR, Estimated: 60 mL/min — ABNORMAL LOW (ref >60)
Glucose, Bld: 131 mg/dL — ABNORMAL HIGH (ref 70–99)
Potassium: 2.1 mmol/L — CL (ref 3.5–5.1)
Sodium: 134 mmol/L — ABNORMAL LOW (ref 135–145)
Total Bilirubin: 2.3 mg/dL — ABNORMAL HIGH (ref 0.3–1.2)
Total Protein: 6.7 g/dL (ref 6.5–8.1)

## 2021-04-19 LAB — PROTIME-INR
INR: 1.3 — ABNORMAL HIGH (ref 0.8–1.2)
Prothrombin Time: 16.2 seconds — ABNORMAL HIGH (ref 11.4–15.2)

## 2021-04-19 LAB — CORTISOL: Cortisol, Plasma: 10.7 ug/dL

## 2021-04-19 LAB — MAGNESIUM: Magnesium: 1.8 mg/dL (ref 1.7–2.4)

## 2021-04-19 LAB — HIV ANTIBODY (ROUTINE TESTING W REFLEX): HIV Screen 4th Generation wRfx: NONREACTIVE

## 2021-04-19 MED ORDER — POTASSIUM CHLORIDE 10 MEQ/100ML IV SOLN
10.0000 meq | INTRAVENOUS | Status: AC
  Administered 2021-04-19 (×6): 10 meq via INTRAVENOUS
  Filled 2021-04-19 (×6): qty 100

## 2021-04-19 MED ORDER — POTASSIUM CHLORIDE 10 MEQ/100ML IV SOLN
10.0000 meq | INTRAVENOUS | Status: AC
  Administered 2021-04-19 (×4): 10 meq via INTRAVENOUS
  Filled 2021-04-19 (×4): qty 100

## 2021-04-19 MED ORDER — POTASSIUM CHLORIDE 20 MEQ PO PACK
40.0000 meq | PACK | Freq: Once | ORAL | Status: AC
  Administered 2021-04-19: 06:00:00 40 meq via ORAL
  Filled 2021-04-19: qty 2

## 2021-04-19 MED ORDER — MAGNESIUM SULFATE IN D5W 1-5 GM/100ML-% IV SOLN
1.0000 g | Freq: Once | INTRAVENOUS | Status: AC
  Administered 2021-04-19: 11:00:00 1 g via INTRAVENOUS
  Filled 2021-04-19 (×2): qty 100

## 2021-04-19 NOTE — Progress Notes (Signed)
Date and time results received: 04/19/21 1100  (use smartphrase ".now" to insert current time)  Test: Potassium Critical Value: 2.5  Name of Provider Notified: MD Shanon Brow  Orders Received? Or Actions Taken?:  Orders received from MD to infuse 6 runs of potassium

## 2021-04-19 NOTE — H&P (Signed)
TRH H&P    Patient Demographics:    Daniel Crawford, is a 66 y.o. male  MRN: [id number]  DOB - Nov 03, 1955  Admit Date - 04/18/2021  Referring MD/NP/PA: Alfonzo Feller  Outpatient Primary MD for the patient is [name], MD  Patient coming from: Home  Chief complaint- abdominal pain   HPI:    Daniel Crawford  is a 66 y.o. male, with history of CKD, depression, hypothyroidism, liver cirrhosis secondary to [facility], thrombocytopenia, leukopenia, and more presents the ED with a chief complaint of abdominal pain.  Patient reports lower right-sided abdominal pain radiating to the right upper quadrant and around to the back at the level of the right upper quadrant.  Patient reports this has been going on for a long time.  This episode has been going on for about a week.  It is sharp/stabbing pain.  Does not associated with mealtime.  It is not associated with bowel movements.  He has no nausea or vomiting.  No fevers.  His last normal meal was 2 PM he reports his appetite is intact.  Patient reports that this pain never really goes away, just becomes tolerable between flares.  He reports he has never been given a reason that he has this pain.  He suspects it Abend be related to his liver cirrhosis.  Patient reports hypokalemia is something he has been battling for a while as well.  He takes potassium supplementation 3 times a day at home.  He also takes between 40 and 80 mg of Lasix at home.  He reports has been compliant with both these medications.  On review of system patient admits to chronic exertional shortness of breath.  He denies any palpitations, chest pain, dysuria, hematuria, diarrhea, other pains.  Patient does not smoke, does not drink alcohol, does not use illicit drugs.  He is vaccinated for COVID.  Patient is full code.  In the ED  Temp 98.8, heart rate 73-79, respiratory rate 12-20, blood pressure 130/68, satting at 91% Patient  is leukopenic at 2.7-chronically low, hemoglobin 17.7, platelets 54 Sodium 133, calcium 2.2, chloride 87, bicarb 37, creatinine at baseline, glucose 135 UA is not indicative of UTI CT abdomen pelvis shows no acute appendicitis.  Liver cirrhosis with portal hypertension.  Small gastroesophageal varices. EKG shows a heart rate 76, QTc 643 sinus rhythm Lipase 48 Patient was given 70 mEq total of potassium in the ED, morphine, Zofran Admission requested for further work-up and management of severe hypokalemia   Review of systems:    In addition to the HPI above,  No Fever-chills, No Headache, No changes with Vision or hearing, No problems swallowing food or Liquids, No Chest pain, Cough or change in shortness of Breath, No Nausea or Vomiting, bowel movements are regular, No Blood in stool or Urine, No dysuria, No new skin rashes or bruises, No new joints pains-aches,  No new weakness, tingling, numbness in any extremity, No recent weight gain or loss, No polyuria, polydypsia or polyphagia, No significant Mental Stressors.  All other systems reviewed and are  negative.    Past History of the following :    Past Medical History:  Diagnosis Date   CKD (chronic kidney disease)    Depression    Hypothyroidism    Liver cirrhosis secondary to NASH ([place])    NASH (nonalcoholic steatohepatitis)    Spleen enlarged    Thrombocytopenia [facility])       Past Surgical History:  Procedure Laterality Date   Bilateral hernia surgery     2006, 2007   CHOLECYSTECTOMY     2016    ESOPHAGEAL DILATION N/A 11/06/2019   Procedure: ESOPHAGEAL DILATION;  Surgeon: Harvel Quale, MD;  [facility];  Service: Gastroenterology;  Laterality: N/A;   ESOPHAGOGASTRODUODENOSCOPY (EGD) WITH PROPOFOL N/A 11/06/2019   Procedure: ESOPHAGOGASTRODUODENOSCOPY (EGD) WITH PROPOFOL;  Surgeon: Harvel Quale, MD;  Location: [facility];  Service: Gastroenterology;  Laterality: N/A;  1045    Polyp removed     in January of 2018.       Social History:      Social History   Tobacco Use   Smoking status: Former    Packs/day: 1.00    Years: 20.00    Pack years: 20.00    Types: Cigarettes    Quit date: 2017    Years since quitting: 6.0   Smokeless tobacco: Never  Substance Use Topics   Alcohol use: No       Family History :     Family History  Problem Relation Age of Onset   Liver cancer Mother    Heart disease Mother    Aneurysm Sister       Home Medications:   Prior to Admission medications   Medication Sig Start Date End Date Taking? Authorizing Provider  Acetaminophen 500 MG capsule Take 1,000 mg by mouth every 6 (six) hours as needed for pain.   Yes [provider]  albuterol (VENTOLIN HFA) 108 (90 Base) MCG/ACT inhaler TAKE 2 PUFFS BY MOUTH EVERY 6 HOURS AS NEEDED FOR WHEEZE OR SHORTNESS OF BREATH Patient taking differently: Inhale 2 puffs into the lungs every 6 (six) hours as needed for shortness of breath. 12/28/20  Yes Rigoberto Noel, MD  ascorbic acid (VITAMIN C) 500 MG tablet Take by mouth. 03/30/21 03/30/22 Yes [provider]  carvedilol (COREG) 12.5 MG tablet Take 12.5 mg by mouth 2 (two) times daily with a meal.   Yes [provider]  Cholecalciferol 25 MCG (1000 UT) tablet Take by mouth. 03/30/21 04/29/21 Yes [provider]  furosemide (LASIX) 20 MG tablet Take 80 mg by mouth daily.   Yes [provider]  gabapentin (NEURONTIN) 100 MG capsule Take 100 mg by mouth at bedtime. 02/20/21  Yes [provider]  hydrOXYzine (VISTARIL) 50 MG capsule Take 50 mg by mouth in the morning and at bedtime.   Yes [provider]  levothyroxine (SYNTHROID) 175 MCG tablet Take 175 mcg by mouth daily before breakfast.   Yes [provider]  metolazone (ZAROXOLYN) 5 MG tablet Take 5 mg by mouth daily.   Yes [provider]  pantoprazole (PROTONIX) 40 MG tablet Take 40 mg by mouth daily.    Yes [provider]  Potassium Chloride ER 20 MEQ TBCR Take 1 tablet by mouth 3 (three) times daily. 06/17/20  Yes [provider]  rifaximin (XIFAXAN) 550 MG TABS tablet Take 550 mg by mouth 2 (two) times daily.   Yes [provider]  sertraline (ZOLOFT) 25 MG tablet Take  25 mg by mouth at bedtime.   Yes [provider]  traZODone (DESYREL) 50 MG tablet Take 50 mg by mouth at bedtime.   Yes [provider]  zinc gluconate 50 MG tablet Take 1 tablet by mouth daily. 03/30/21 03/30/22 Yes [provider]  lactulose (CHRONULAC) 10 GM/15ML solution Take 15 mLs (10 g total) by mouth 3 (three) times daily. Patient not taking: Reported on 04/18/2021 10/17/16   Butch Penny, NP     Allergies:    No Known Allergies   Physical Exam:   Vitals  Blood pressure (!) 109/59, pulse 82, temperature 98.8 F (37.1 C), temperature source Oral, resp. rate 12, weight (!) 161.9 kg, SpO2 93 %.   1.  General: Patient lying supine in bed,  no acute distress   2. Psychiatric: Alert and oriented x 3, mood and behavior normal for situation, pleasant and cooperative with exam   3. Neurologic: Speech and language are normal, face is symmetric, moves all 4 extremities voluntarily, at baseline without acute deficits on limited exam   4. HEENMT:  Head is atraumatic, normocephalic, pupils reactive to light, neck is supple, trachea is midline, mucous membranes are moist   5. Respiratory : Lungs are clear to auscultation bilaterally without wheezing, rhonchi, rales, no cyanosis, no increase in work of breathing or accessory muscle use   6. Cardiovascular : Heart rate normal, rhythm is regular, no murmurs, rubs or gallops, no peripheral edema, peripheral pulses palpated   7. Gastrointestinal:  Abdomen is soft, nondistended, tender to the right upper quadrant most exquisitely, less tender to the right lower quadrant, also tender in the epigastric region, bowel  sounds active,    8. Skin:  Skin is warm, dry and intact without rashes, acute lesions, or ulcers on limited exam   9.Musculoskeletal:  No acute deformities or trauma, no asymmetry in tone, no peripheral edema, peripheral pulses palpated, no tenderness to palpation in the extremities     Data Review:    CBC Recent Labs  Lab 04/18/21 1601  WBC 2.7*  HGB 11.7*  HCT 35.4*  PLT 54*  MCV 86.3  MCH 28.5  MCHC 33.1  RDW 16.4*  LYMPHSABS 0.8  MONOABS 0.3  EOSABS 0.2  BASOSABS 0.0   ------------------------------------------------------------------------------------------------------------------  Results for orders placed or performed during the hospital encounter of 04/18/21 (from the past 48 hour(s))  CBC with Differential/Platelet     Status: Abnormal   Collection Time: 04/18/21  4:01 PM  Result Value Ref Range   WBC 2.7 (L) 4.0 - 10.5 K/uL   RBC 4.10 (L) 4.22 - 5.81 MIL/uL   Hemoglobin 11.7 (L) 13.0 - 17.0 g/dL   HCT 35.4 (L) 39.0 - 52.0 %   MCV 86.3 80.0 - 100.0 fL   MCH 28.5 26.0 - 34.0 pg   MCHC 33.1 30.0 - 36.0 g/dL   RDW 16.4 (H) 11.5 - 15.5 %   Platelets 54 (L) 150 - 400 K/uL    Comment: SPECIMEN CHECKED FOR CLOTS Immature Platelet Fraction Mish be clinically indicated, consider ordering this additional test [id number] CONSISTENT WITH PREVIOUS RESULT REPEATED TO VERIFY    nRBC 0.0 0.0 - 0.2 %   Neutrophils Relative % 50 %   Neutro Abs 1.4 (L) 1.7 - 7.7 K/uL   Lymphocytes Relative 30 %   Lymphs Abs 0.8 0.7 - 4.0 K/uL   Monocytes Relative 13 %   Monocytes Absolute 0.3 0.1 - 1.0 K/uL   Eosinophils Relative 6 %  Eosinophils Absolute 0.2 0.0 - 0.5 K/uL   Basophils Relative 1 %   Basophils Absolute 0.0 0.0 - 0.1 K/uL   WBC Morphology MORPHOLOGY UNREMARKABLE    RBC Morphology MORPHOLOGY UNREMARKABLE    Immature Granulocytes 0 %   Abs Immature Granulocytes 0.00 0.00 - 0.07 K/uL    Comment: Performed at [facility], [address] [place], [place] [address]   Comprehensive metabolic panel     Status: Abnormal   Collection Time: 04/18/21  4:01 PM  Result Value Ref Range   Sodium 133 (L) 135 - 145 mmol/L   Potassium 2.2 (LL) 3.5 - 5.1 mmol/L    Comment: CRITICAL RESULT CALLED TO, READ BACK BY AND VERIFIED WITH: HICKS,RN AT 1757 ON 1.24.23 BY ISLEY,B    Chloride 87 (L) 98 - 111 mmol/L   CO2 37 (H) 22 - 32 mmol/L   Glucose, Bld 135 (H) 70 - 99 mg/dL    Comment: Glucose reference range applies only to samples taken after fasting for at least 8 hours.   BUN 14 8 - 23 mg/dL   Creatinine, Ser 1.19 0.61 - 1.24 mg/dL   Calcium 8.2 (L) 8.9 - 10.3 mg/dL   Total Protein 6.7 6.5 - 8.1 g/dL   Albumin 3.0 (L) 3.5 - 5.0 g/dL   AST 48 (H) 15 - 41 U/L   ALT 19 0 - 44 U/L   Alkaline Phosphatase 76 38 - 126 U/L   Total Bilirubin 2.0 (H) 0.3 - 1.2 mg/dL   GFR, Estimated >60 >60 mL/min    Comment: (NOTE) Calculated using the CKD-EPI Creatinine Equation (2021)    Anion gap 9 5 - 15    Comment: Performed at [facility], [address] [place], [place] [address]  Lipase, blood     Status: None   Collection Time: 04/18/21  4:01 PM  Result Value Ref Range   Lipase 48 11 - 51 U/L    Comment: Performed at [facility], [address] [place], [place] [address]  Magnesium     Status: None   Collection Time: 04/18/21  4:15 PM  Result Value Ref Range   Magnesium 1.8 1.7 - 2.4 mg/dL    Comment: Performed at [facility], [address], [place], [place] [address]  Urinalysis, Routine w reflex microscopic Urine, Clean Catch     Status: Abnormal   Collection Time: 04/18/21  5:15 PM  Result Value Ref Range   Color, Urine AMBER (A) YELLOW    Comment: BIOCHEMICALS Bert BE AFFECTED BY COLOR   APPearance CLEAR CLEAR   Specific Gravity, Urine 1.023 1.005 - 1.030   pH 6.0 5.0 - 8.0   Glucose, UA NEGATIVE NEGATIVE mg/dL   Hgb urine dipstick NEGATIVE NEGATIVE   Bilirubin Urine NEGATIVE NEGATIVE   Ketones, ur NEGATIVE NEGATIVE mg/dL   Protein, ur 30 (A) NEGATIVE  mg/dL   Nitrite NEGATIVE NEGATIVE   Leukocytes,Ua NEGATIVE NEGATIVE   RBC / HPF 0-5 0 - 5 RBC/hpf   WBC, UA 0-5 0 - 5 WBC/hpf   Bacteria, UA RARE (A) NONE SEEN   Squamous Epithelial / LPF 0-5 0 - 5   Mucus PRESENT    Hyaline Casts, UA PRESENT     Comment: Performed at [facility], [address] [place],  [address]  Resp Panel by RT-PCR (Flu A&B, Covid) Nasopharyngeal Swab     Status: None   Collection Time: 04/18/21  8:10 PM   Specimen: Nasopharyngeal Swab; Nasopharyngeal(NP) swabs in vial transport  medium  Result Value Ref Range   SARS Coronavirus 2 by RT PCR NEGATIVE NEGATIVE    Comment: (NOTE) SARS-CoV-2 target nucleic acids are NOT DETECTED.  The SARS-CoV-2 RNA is generally detectable in upper respiratory specimens during the acute phase of infection. The lowest concentration of SARS-CoV-2 viral copies this assay can detect is 138 copies/mL. A negative result does not preclude SARS-Cov-2 infection and should not be used as the sole basis for treatment or other patient management decisions. A negative result [place] occur with  improper specimen collection/handling, submission of specimen other than nasopharyngeal swab, presence of viral mutation(s) within the areas targeted by this assay, and inadequate number of viral copies(<138 copies/mL). A negative result must be combined with clinical observations, patient history, and epidemiological information. The expected result is Negative.  Fact Sheet for Patients:  EntrepreneurPulse.com.au  Fact Sheet for Healthcare Providers:  IncredibleEmployment.be  This test is no t yet approved or cleared by the [place] FDA and  has been authorized for detection and/or diagnosis of SARS-CoV-2 by FDA under an Emergency Use Authorization (EUA). This EUA will remain  in effect (meaning this test can be used) for the duration of the COVID-19 declaration under Section 564(b)(1) of the Act,  21 U.S.C.section 360bbb-3(b)(1), unless the authorization is terminated  or revoked sooner.       Influenza A by PCR NEGATIVE NEGATIVE   Influenza B by PCR NEGATIVE NEGATIVE    Comment: (NOTE) The Xpert Xpress SARS-CoV-2/FLU/RSV plus assay is intended as an aid in the diagnosis of influenza from Nasopharyngeal swab specimens and should not be used as a sole basis for treatment. Nasal washings and aspirates are unacceptable for Xpert Xpress SARS-CoV-2/FLU/RSV testing.  Fact Sheet for Patients: EntrepreneurPulse.com.au  Fact Sheet for Healthcare Providers: IncredibleEmployment.be  This test is not yet approved or cleared by the [place] FDA and has been authorized for detection and/or diagnosis of SARS-CoV-2 by FDA under an Emergency Use Authorization (EUA). This EUA will remain in effect (meaning this test can be used) for the duration of the COVID-19 declaration under Section 564(b)(1) of the Act, 21 U.S.C. section 360bbb-3(b)(1), unless the authorization is terminated or revoked.  Performed at [facility], [address] [place], [place] [address]     Chemistries  Recent Labs  Lab 04/18/21 1601 04/18/21 1615  NA 133*  --   K 2.2*  --   CL 87*  --   CO2 37*  --   GLUCOSE 135*  --   BUN 14  --   CREATININE 1.19  --   CALCIUM 8.2*  --   MG  --  1.8  AST 48*  --   ALT 19  --   ALKPHOS 76  --   BILITOT 2.0*  --    ------------------------------------------------------------------------------------------------------------------  ------------------------------------------------------------------------------------------------------------------ GFR: CrCl cannot be calculated (Unknown ideal weight.). Liver Function Tests: Recent Labs  Lab 04/18/21 1601  AST 48*  ALT 19  ALKPHOS 76  BILITOT 2.0*  PROT 6.7  ALBUMIN 3.0*   Recent Labs  Lab 04/18/21 1601  LIPASE 48   No results for input(s): AMMONIA in the last 168  hours. Coagulation Profile: No results for input(s): INR, PROTIME in the last 168 hours. Cardiac Enzymes: No results for input(s): CKTOTAL, CKMB, CKMBINDEX, TROPONINI in the last 168 hours. BNP (last 3 results) No results for input(s): PROBNP in the last 8760 hours. HbA1C: No results for input(s): HGBA1C in the last 72 hours. CBG: No results for input(s): GLUCAP  in the last 168 hours. Lipid Profile: No results for input(s): CHOL, HDL, LDLCALC, TRIG, CHOLHDL, LDLDIRECT in the last 72 hours. Thyroid Function Tests: No results for input(s): TSH, T4TOTAL, FREET4, T3FREE, THYROIDAB in the last 72 hours. Anemia Panel: No results for input(s): VITAMINB12, FOLATE, FERRITIN, TIBC, IRON, RETICCTPCT in the last 72 hours.  --------------------------------------------------------------------------------------------------------------- Urine analysis:    Component Value Date/Time   COLORURINE AMBER (A) 04/18/2021 1715   APPEARANCEUR CLEAR 04/18/2021 1715   LABSPEC 1.023 04/18/2021 1715   PHURINE 6.0 04/18/2021 1715   GLUCOSEU NEGATIVE 04/18/2021 1715   HGBUR NEGATIVE 04/18/2021 [place] 04/18/2021 [place] 04/18/2021 1715   PROTEINUR 30 (A) 04/18/2021 1715   NITRITE NEGATIVE 04/18/2021 1715   LEUKOCYTESUR NEGATIVE 04/18/2021 1715      Imaging Results:    CT ABDOMEN PELVIS W CONTRAST  Result Date: 04/18/2021 CLINICAL DATA:  Right lower quadrant pain EXAM: CT ABDOMEN AND PELVIS WITH CONTRAST TECHNIQUE: Multidetector CT imaging of the abdomen and pelvis was performed using the standard protocol following bolus administration of intravenous contrast. RADIATION DOSE REDUCTION: This exam was performed according to the departmental dose-optimization program which includes automated exposure control, adjustment of the mA and/or kV according to patient size and/or use of iterative reconstruction technique. CONTRAST:  155mL OMNIPAQUE IOHEXOL 300 MG/ML  SOLN  COMPARISON:  Ultrasound 03/22/2021, CT 03/23/2021 FINDINGS: Lower chest: No acute consolidation or pleural effusion. Mild cardiomegaly. Small gastroesophageal varices. Hepatobiliary: Liver cirrhosis. Status post cholecystectomy. No biliary dilatation Pancreas: Unremarkable. No pancreatic ductal dilatation or surrounding inflammatory changes. Spleen: Enlarged measuring 22 cm craniocaudad. Adrenals/Urinary Tract: Adrenal glands are unremarkable. Kidneys are normal, without focal lesion, or hydronephrosis. Small nonobstructing stone lower pole right kidney. Bladder is unremarkable. Stomach/Bowel: Stomach is within normal limits. Appendix appears normal. No evidence of bowel wall thickening, distention, or inflammatory changes. Vascular/Lymphatic: Nonaneurysmal aorta. Nonspecific retroperitoneal lymph nodes. Recanalized paraumbilical vein. Reproductive: Prostate is unremarkable. Other: No free air. Small amount of abdominopelvic ascites. Hazy edema and stranding within the mesentery and colic gutters. Large periumbilical hernia containing mesenteric fat. Small amount of fluid in the hernia sac. Musculoskeletal: No acute osseous abnormality. IMPRESSION: 1. Negative for acute appendicitis. 2. Liver cirrhosis with portal hypertension. Splenomegaly and small amount of abdominopelvic ascites. Small gastroesophageal varices. 3. Large periumbilical hernia containing mesenteric fat and small amount of fluid 4. Nonobstructing right kidney stone Electronically Signed   By: Donavan Foil M.D.   On: 04/18/2021 19:18       Assessment & Plan:    Principal Problem:   Hypokalemia Active Problems:   Hypothyroidism   Thrombocytopenia (HCC)   Leukopenia   Prolonged QT interval   Hypokalemia Likely secondary to renal and GI losses secondary to Lasix and rifaximin Replace and recheck Monitor on telemetry Check magnesium  Prolonged QT Likely related to hypokalemia Monitor on telemetry Likely to improve with K+  repletion Thyroid disease Continue synthroid GERD Continue protonix NASH Continue Rifaximin    DVT Prophylaxis-    SCDs   AM Labs Ordered, also please review Full Orders  Family Communication: No family at bedside  Code Status: Full  Admission status: Observation Disposition: Anticipated Discharge date 24 hours Discharge to Home  Time spent in minutes : [place] DO

## 2021-04-19 NOTE — Progress Notes (Signed)
Patient ID: Daniel Crawford, male   DOB: 1955-12-03, 66 y.o.   MRN: [id number] Chart reviewed.  Repeat potassium 2.5.  Proceed with 60 more milliequivalents of KCl IV.  Give mag sulfate 1 g IV also.  Continue to replete as needed.

## 2021-04-19 NOTE — Progress Notes (Signed)
°  Transition of Care [facility]) Screening Note   Patient Details  Name: Cloud Hafen Date of Birth: 04/04/1955   Transition of Care [facility]) CM/SW Contact:    Iona Beard, [place] Phone Number: 04/19/2021, 11:25 AM    Transition of Care Department [facility]) has reviewed patient and no TOC needs have been identified at this time. We will continue to monitor patient advancement through interdisciplinary progression rounds. If new patient transition needs arise, please place a TOC consult.

## 2021-04-20 ENCOUNTER — Encounter (INDEPENDENT_AMBULATORY_CARE_PROVIDER_SITE_OTHER): Payer: Self-pay | Admitting: *Deleted

## 2021-04-20 DIAGNOSIS — E876 Hypokalemia: Principal | ICD-10-CM

## 2021-04-20 LAB — CBC WITH DIFFERENTIAL/PLATELET
Abs Immature Granulocytes: 0 10*3/uL (ref 0.00–0.07)
Basophils Absolute: 0 10*3/uL (ref 0.0–0.1)
Basophils Relative: 0 %
Eosinophils Absolute: 0.2 10*3/uL (ref 0.0–0.5)
Eosinophils Relative: 7 %
HCT: 32.4 % — ABNORMAL LOW (ref 39.0–52.0)
Hemoglobin: 11 g/dL — ABNORMAL LOW (ref 13.0–17.0)
Immature Granulocytes: 0 %
Lymphocytes Relative: 33 %
Lymphs Abs: 0.8 10*3/uL (ref 0.7–4.0)
MCH: 29.9 pg (ref 26.0–34.0)
MCHC: 34 g/dL (ref 30.0–36.0)
MCV: 88 fL (ref 80.0–100.0)
Monocytes Absolute: 0.3 10*3/uL (ref 0.1–1.0)
Monocytes Relative: 13 %
Neutro Abs: 1.1 10*3/uL — ABNORMAL LOW (ref 1.7–7.7)
Neutrophils Relative %: 47 %
Platelets: 56 10*3/uL — ABNORMAL LOW (ref 150–400)
RBC: 3.68 MIL/uL — ABNORMAL LOW (ref 4.22–5.81)
RDW: 16.2 % — ABNORMAL HIGH (ref 11.5–15.5)
WBC: 2.3 10*3/uL — ABNORMAL LOW (ref 4.0–10.5)
nRBC: 0 % (ref 0.0–0.2)

## 2021-04-20 LAB — BASIC METABOLIC PANEL
Anion gap: 9 (ref 5–15)
Anion gap: 9 (ref 5–15)
Anion gap: 2 — ABNORMAL LOW (ref 5–15)
BUN: 14 mg/dL (ref 8–23)
BUN: 13 mg/dL (ref 8–23)
BUN: 14 mg/dL (ref 8–23)
CO2: 42 mmol/L — ABNORMAL HIGH (ref 22–32)
CO2: 39 mmol/L — ABNORMAL HIGH (ref 22–32)
CO2: 44 mmol/L — ABNORMAL HIGH (ref 22–32)
Calcium: 8.3 mg/dL — ABNORMAL LOW (ref 8.9–10.3)
Calcium: 8.1 mg/dL — ABNORMAL LOW (ref 8.9–10.3)
Calcium: 7.8 mg/dL — ABNORMAL LOW (ref 8.9–10.3)
Chloride: 85 mmol/L — ABNORMAL LOW (ref 98–111)
Chloride: 86 mmol/L — ABNORMAL LOW (ref 98–111)
Chloride: 85 mmol/L — ABNORMAL LOW (ref 98–111)
Creatinine, Ser: 1.32 mg/dL — ABNORMAL HIGH (ref 0.61–1.24)
Creatinine, Ser: 1.36 mg/dL — ABNORMAL HIGH (ref 0.61–1.24)
Creatinine, Ser: 1.5 mg/dL — ABNORMAL HIGH (ref 0.61–1.24)
GFR, Estimated: >60 mL/min (ref >60)
GFR, Estimated: 58 mL/min — ABNORMAL LOW (ref >60)
GFR, Estimated: 52 mL/min — ABNORMAL LOW (ref >60)
Glucose, Bld: 110 mg/dL — ABNORMAL HIGH (ref 70–99)
Glucose, Bld: 139 mg/dL — ABNORMAL HIGH (ref 70–99)
Glucose, Bld: 165 mg/dL — ABNORMAL HIGH (ref 70–99)
Potassium: 2.1 mmol/L — CL (ref 3.5–5.1)
Potassium: 2.3 mmol/L — CL (ref 3.5–5.1)
Potassium: 2 mmol/L — CL (ref 3.5–5.1)
Sodium: 136 mmol/L (ref 135–145)
Sodium: 134 mmol/L — ABNORMAL LOW (ref 135–145)
Sodium: 131 mmol/L — ABNORMAL LOW (ref 135–145)

## 2021-04-20 LAB — MAGNESIUM: Magnesium: 1.9 mg/dL (ref 1.7–2.4)

## 2021-04-20 MED ORDER — POTASSIUM CHLORIDE CRYS ER 20 MEQ PO TBCR
30.0000 meq | EXTENDED_RELEASE_TABLET | Freq: Three times a day (TID) | ORAL | Status: DC
  Administered 2021-04-20 – 2021-04-22 (×6): 30 meq via ORAL
  Filled 2021-04-20 (×6): qty 1

## 2021-04-20 MED ORDER — POTASSIUM CHLORIDE 10 MEQ/100ML IV SOLN
10.0000 meq | INTRAVENOUS | Status: AC
  Administered 2021-04-20 (×6): 10 meq via INTRAVENOUS
  Filled 2021-04-20 (×4): qty 100

## 2021-04-20 MED ORDER — POTASSIUM CHLORIDE 10 MEQ/100ML IV SOLN
INTRAVENOUS | Status: AC
  Filled 2021-04-20: qty 100

## 2021-04-20 MED ORDER — POTASSIUM CHLORIDE 10 MEQ/100ML IV SOLN
10.0000 meq | INTRAVENOUS | Status: AC
  Administered 2021-04-20 – 2021-04-21 (×4): 10 meq via INTRAVENOUS
  Filled 2021-04-20 (×3): qty 100

## 2021-04-20 MED ORDER — FUROSEMIDE 40 MG PO TABS
40.0000 mg | ORAL_TABLET | Freq: Every day | ORAL | Status: DC
  Administered 2021-04-20: 40 mg via ORAL
  Filled 2021-04-20 (×2): qty 1

## 2021-04-20 NOTE — Progress Notes (Signed)
Patient has been stable this shift.  Patient finished run of potassium, no new complaints from patient.  Vitals stable this shift.

## 2021-04-20 NOTE — Progress Notes (Signed)
TOC informed that pt had a dog at home and there was question as to if pt had someone that would be able to care for dog while he is in hospital. CSW spoke with pt who states he has a friend going to the home daily to feed, water, and change the dogs pads. Pt states he just hates having to be away from his dog.

## 2021-04-20 NOTE — Progress Notes (Signed)
Patient ID: Daniel Crawford, male   DOB: March 03, 1956, 66 y.o.   MRN: [id number]                                                                               HPI:    Daniel Crawford  is a 66 y.o. male, with history of CKD, depression, hypothyroidism, liver cirrhosis secondary to [facility], thrombocytopenia, leukopenia, and more presents the ED with a chief complaint of abdominal pain.  Patient reports lower right-sided abdominal pain radiating to the right upper quadrant and around to the back at the level of the right upper quadrant.  Patient reports this has been going on for a long time.  This episode has been going on for about a week.  It is sharp/stabbing pain.  Does not associated with mealtime.  It is not associated with bowel movements.  He has no nausea or vomiting.  No fevers.  His last normal meal was 2 PM he reports his appetite is intact.  Patient reports that this pain never really goes away, just becomes tolerable between flares.  He reports he has never been given a reason that he has this pain.  He suspects it [place] be related to his liver cirrhosis.  Patient reports hypokalemia is something he has been battling for a while as well.  He takes potassium supplementation 3 times a day at home.  He also takes between 40 and 80 mg of Lasix at home.  He reports has been compliant with both these medications.  On review of system patient admits to chronic exertional shortness of breath.  He denies any palpitations, chest pain, dysuria, hematuria, diarrhea, other pains.  Patient does not smoke, does not drink alcohol, does not use illicit drugs.  He is vaccinated for COVID.  Patient is full code.  In the ED  Temp 98.8, heart rate 73-79, respiratory rate 12-20, blood pressure 130/68, satting at 91% Patient is leukopenic at 2.7-chronically low, hemoglobin 17.7, platelets 54 Sodium 133, calcium 2.2, chloride 87, bicarb 37, creatinine at baseline, glucose 135 UA is not indicative of UTI CT abdomen pelvis shows no acute  appendicitis.  Liver cirrhosis with portal hypertension.  Small gastroesophageal varices. EKG shows a heart rate 76, QTc 643 sinus rhythm Lipase 48 Patient was given 70 mEq total of potassium in the ED, morphine, Zofran Admission requested for further work-up and management of severe hypokalemia  Subjective Wants to go home he has a dog at home he needs to take care of   Physical Exam:   Vitals  Blood pressure 121/62, pulse 63, temperature 97.9 F (36.6 C), temperature source Oral, resp. rate 20, height 6\' 1"  (1.854 m), weight (!) 161.9 kg, SpO2 98 %.   1.  General: Patient lying supine in bed,  no acute distress   2. Psychiatric: Alert and oriented x 3, mood and behavior normal for situation, pleasant and cooperative with exam   3. Neurologic: Speech and language are normal, face is symmetric, moves all 4 extremities voluntarily, at baseline without acute deficits on limited exam   4. HEENMT:  Head is atraumatic, normocephalic, pupils reactive to light, neck is supple, trachea is midline, mucous membranes are moist  5. Respiratory : Lungs are clear to auscultation bilaterally without wheezing, rhonchi, rales, no cyanosis, no increase in work of breathing or accessory muscle use   6. Cardiovascular : Heart rate normal, rhythm is regular, no murmurs, rubs or gallops, no peripheral edema, peripheral pulses palpated   7. Gastrointestinal:  Abdomen is soft, nondistended, tender to the right upper quadrant most exquisitely, less tender to the right lower quadrant, also tender in the epigastric region, bowel sounds active,    8. Skin:  Skin is warm, dry and intact without rashes, acute lesions, or ulcers on limited exam   9.Musculoskeletal:  No acute deformities or trauma, no asymmetry in tone, no peripheral edema, peripheral pulses palpated, no tenderness to palpation in the extremities     Data Review:    CBC Recent Labs  Lab 04/18/21 1601 04/19/21 0350 04/20/21 0536   WBC 2.7* 2.5* 2.3*  HGB 11.7* 11.5* 11.0*  HCT 35.4* 33.9* 32.4*  PLT 54* 56* 56*  MCV 86.3 88.1 88.0  MCH 28.5 29.9 29.9  MCHC 33.1 33.9 34.0  RDW 16.4* 16.7* 16.2*  LYMPHSABS 0.8 0.7 0.8  MONOABS 0.3 0.3 0.3  EOSABS 0.2 0.2 0.2  BASOSABS 0.0 0.0 0.0   ------------------------------------------------------------------------------------------------------------------  Results for orders placed or performed during the hospital encounter of 04/18/21 (from the past 48 hour(s))  CBC with Differential/Platelet     Status: Abnormal   Collection Time: 04/18/21  4:01 PM  Result Value Ref Range   WBC 2.7 (L) 4.0 - 10.5 K/uL   RBC 4.10 (L) 4.22 - 5.81 MIL/uL   Hemoglobin 11.7 (L) 13.0 - 17.0 g/dL   HCT 35.4 (L) 39.0 - 52.0 %   MCV 86.3 80.0 - 100.0 fL   MCH 28.5 26.0 - 34.0 pg   MCHC 33.1 30.0 - 36.0 g/dL   RDW 16.4 (H) 11.5 - 15.5 %   Platelets 54 (L) 150 - 400 K/uL    Comment: SPECIMEN CHECKED FOR CLOTS Immature Platelet Fraction [place] be clinically indicated, consider ordering this additional test [id number] CONSISTENT WITH PREVIOUS RESULT REPEATED TO VERIFY    nRBC 0.0 0.0 - 0.2 %   Neutrophils Relative % 50 %   Neutro Abs 1.4 (L) 1.7 - 7.7 K/uL   Lymphocytes Relative 30 %   Lymphs Abs 0.8 0.7 - 4.0 K/uL   Monocytes Relative 13 %   Monocytes Absolute 0.3 0.1 - 1.0 K/uL   Eosinophils Relative 6 %   Eosinophils Absolute 0.2 0.0 - 0.5 K/uL   Basophils Relative 1 %   Basophils Absolute 0.0 0.0 - 0.1 K/uL   WBC Morphology MORPHOLOGY UNREMARKABLE    RBC Morphology MORPHOLOGY UNREMARKABLE    Immature Granulocytes 0 %   Abs Immature Granulocytes 0.00 0.00 - 0.07 K/uL    Comment: Performed at [facility], [address], [place], [place] [address]  Comprehensive metabolic panel     Status: Abnormal   Collection Time: 04/18/21  4:01 PM  Result Value Ref Range   Sodium 133 (L) 135 - 145 mmol/L   Potassium 2.2 (LL) 3.5 - 5.1 mmol/L    Comment: CRITICAL RESULT CALLED TO, READ BACK  BY AND VERIFIED WITH: HICKS,RN AT 1757 ON 1.24.23 BY ISLEY,B    Chloride 87 (L) 98 - 111 mmol/L   CO2 37 (H) 22 - 32 mmol/L   Glucose, Bld 135 (H) 70 - 99 mg/dL    Comment: Glucose reference range applies only to samples taken after fasting for at least 8 hours.  BUN 14 8 - 23 mg/dL   Creatinine, Ser 1.19 0.61 - 1.24 mg/dL   Calcium 8.2 (L) 8.9 - 10.3 mg/dL   Total Protein 6.7 6.5 - 8.1 g/dL   Albumin 3.0 (L) 3.5 - 5.0 g/dL   AST 48 (H) 15 - 41 U/L   ALT 19 0 - 44 U/L   Alkaline Phosphatase 76 38 - 126 U/L   Total Bilirubin 2.0 (H) 0.3 - 1.2 mg/dL   GFR, Estimated >60 >60 mL/min    Comment: (NOTE) Calculated using the CKD-EPI Creatinine Equation (2021)    Anion gap 9 5 - 15    Comment: Performed at [facility], [address] [place], [place] [address]  Lipase, blood     Status: None   Collection Time: 04/18/21  4:01 PM  Result Value Ref Range   Lipase 48 11 - 51 U/L    Comment: Performed at [facility], [address], [place], [place] [address]  Magnesium     Status: None   Collection Time: 04/18/21  4:15 PM  Result Value Ref Range   Magnesium 1.8 1.7 - 2.4 mg/dL    Comment: Performed at [facility], [address] [place], [place] [address]  Urinalysis, Routine w reflex microscopic Urine, Clean Catch     Status: Abnormal   Collection Time: 04/18/21  5:15 PM  Result Value Ref Range   Color, Urine AMBER (A) YELLOW    Comment: BIOCHEMICALS [place] BE AFFECTED BY COLOR   APPearance CLEAR CLEAR   Specific Gravity, Urine 1.023 1.005 - 1.030   pH 6.0 5.0 - 8.0   Glucose, UA NEGATIVE NEGATIVE mg/dL   Hgb urine dipstick NEGATIVE NEGATIVE   Bilirubin Urine NEGATIVE NEGATIVE   Ketones, ur NEGATIVE NEGATIVE mg/dL   Protein, ur 30 (A) NEGATIVE mg/dL   Nitrite NEGATIVE NEGATIVE   Leukocytes,Ua NEGATIVE NEGATIVE   RBC / HPF 0-5 0 - 5 RBC/hpf   WBC, UA 0-5 0 - 5 WBC/hpf   Bacteria, UA RARE (A) NONE SEEN   Squamous Epithelial / LPF 0-5 0 - 5   Mucus PRESENT    Hyaline Casts, UA  PRESENT     Comment: Performed at [facility], [address] [place], [place] [address]  Resp Panel by RT-PCR (Flu A&B, Covid) Nasopharyngeal Swab     Status: None   Collection Time: 04/18/21  8:10 PM   Specimen: Nasopharyngeal Swab; Nasopharyngeal(NP) swabs in vial transport medium  Result Value Ref Range   SARS Coronavirus 2 by RT PCR NEGATIVE NEGATIVE    Comment: (NOTE) SARS-CoV-2 target nucleic acids are NOT DETECTED.  The SARS-CoV-2 RNA is generally detectable in upper respiratory specimens during the acute phase of infection. The lowest concentration of SARS-CoV-2 viral copies this assay can detect is 138 copies/mL. A negative result does not preclude SARS-Cov-2 infection and should not be used as the sole basis for treatment or other patient management decisions. A negative result [place] occur with  improper specimen collection/handling, submission of specimen other than nasopharyngeal swab, presence of viral mutation(s) within the areas targeted by this assay, and inadequate number of viral copies(<138 copies/mL). A negative result must be combined with clinical observations, patient history, and epidemiological information. The expected result is Negative.  Fact Sheet for Patients:  EntrepreneurPulse.com.au  Fact Sheet for Healthcare Providers:  IncredibleEmployment.be  This test is no t yet approved or cleared by the [place] FDA and  has been authorized for detection and/or diagnosis of SARS-CoV-2 by FDA  under an Emergency Use Authorization (EUA). This EUA will remain  in effect (meaning this test can be used) for the duration of the COVID-19 declaration under Section 564(b)(1) of the Act, 21 U.S.C.section 360bbb-3(b)(1), unless the authorization is terminated  or revoked sooner.       Influenza A by PCR NEGATIVE NEGATIVE   Influenza B by PCR NEGATIVE NEGATIVE    Comment: (NOTE) The Xpert Xpress SARS-CoV-2/FLU/RSV plus assay  is intended as an aid in the diagnosis of influenza from Nasopharyngeal swab specimens and should not be used as a sole basis for treatment. Nasal washings and aspirates are unacceptable for Xpert Xpress SARS-CoV-2/FLU/RSV testing.  Fact Sheet for Patients: EntrepreneurPulse.com.au  Fact Sheet for Healthcare Providers: IncredibleEmployment.be  This test is not yet approved or cleared by the [place] FDA and has been authorized for detection and/or diagnosis of SARS-CoV-2 by FDA under an Emergency Use Authorization (EUA). This EUA will remain in effect (meaning this test can be used) for the duration of the COVID-19 declaration under Section 564(b)(1) of the Act, 21 U.S.C. section 360bbb-3(b)(1), unless the authorization is terminated or revoked.  Performed at [facility], [address] [place], [place] [address]   HIV Antibody (routine testing w rflx)     Status: None   Collection Time: 04/19/21  3:50 AM  Result Value Ref Range   HIV Screen 4th Generation wRfx Non Reactive Non Reactive    Comment: Performed at [facility], [place] [address] [place], [place] [address]  Magnesium     Status: None   Collection Time: 04/19/21  3:50 AM  Result Value Ref Range   Magnesium 1.8 1.7 - 2.4 mg/dL    Comment: Performed at [facility], [address] [place], [place] [address]  CBC WITH DIFFERENTIAL     Status: Abnormal   Collection Time: 04/19/21  3:50 AM  Result Value Ref Range   WBC 2.5 (L) 4.0 - 10.5 K/uL   RBC 3.85 (L) 4.22 - 5.81 MIL/uL   Hemoglobin 11.5 (L) 13.0 - 17.0 g/dL   HCT 33.9 (L) 39.0 - 52.0 %   MCV 88.1 80.0 - 100.0 fL   MCH 29.9 26.0 - 34.0 pg   MCHC 33.9 30.0 - 36.0 g/dL   RDW 16.7 (H) 11.5 - 15.5 %   Platelets 56 (L) 150 - 400 K/uL    Comment: SPECIMEN CHECKED FOR CLOTS Immature Platelet Fraction [place] be clinically indicated, consider ordering this additional test [id number] CONSISTENT WITH PREVIOUS RESULT    nRBC  0.0 0.0 - 0.2 %   Neutrophils Relative % 54 %   Neutro Abs 1.3 (L) 1.7 - 7.7 K/uL   Lymphocytes Relative 29 %   Lymphs Abs 0.7 0.7 - 4.0 K/uL   Monocytes Relative 11 %   Monocytes Absolute 0.3 0.1 - 1.0 K/uL   Eosinophils Relative 6 %   Eosinophils Absolute 0.2 0.0 - 0.5 K/uL   Basophils Relative 0 %   Basophils Absolute 0.0 0.0 - 0.1 K/uL   Immature Granulocytes 0 %   Abs Immature Granulocytes 0.01 0.00 - 0.07 K/uL    Comment: Performed at [facility], [address], [place], [place] [address]  Protime-INR     Status: Abnormal   Collection Time: 04/19/21  3:50 AM  Result Value Ref Range   Prothrombin Time 16.2 (H) 11.4 - 15.2 seconds   INR 1.3 (H) 0.8 - 1.2    Comment: (NOTE) INR goal varies based on device and disease  states. Performed at [facility], [address] [place], [place] [address]   Comprehensive metabolic panel     Status: Abnormal   Collection Time: 04/19/21  3:50 AM  Result Value Ref Range   Sodium 134 (L) 135 - 145 mmol/L   Potassium 2.1 (LL) 3.5 - 5.1 mmol/L    Comment: CRITICAL RESULT CALLED TO, READ BACK BY AND VERIFIED WITH: GESELL,K AT 5:30AM ON 04/19/21 BY FESTERMAN,C    Chloride 89 (L) 98 - 111 mmol/L   CO2 39 (H) 22 - 32 mmol/L   Glucose, Bld 131 (H) 70 - 99 mg/dL    Comment: Glucose reference range applies only to samples taken after fasting for at least 8 hours.   BUN 13 8 - 23 mg/dL   Creatinine, Ser 1.34 (H) 0.61 - 1.24 mg/dL   Calcium 7.9 (L) 8.9 - 10.3 mg/dL   Total Protein 6.7 6.5 - 8.1 g/dL   Albumin 3.0 (L) 3.5 - 5.0 g/dL   AST 48 (H) 15 - 41 U/L   ALT 20 0 - 44 U/L   Alkaline Phosphatase 67 38 - 126 U/L   Total Bilirubin 2.3 (H) 0.3 - 1.2 mg/dL   GFR, Estimated 60 (L) >60 mL/min    Comment: (NOTE) Calculated using the CKD-EPI Creatinine Equation (2021)    Anion gap 6 5 - 15    Comment: Performed at [facility], [address] [place], [place] [address]  Cortisol, Random     Status: None   Collection Time: 04/19/21 10:38 AM   Result Value Ref Range   Cortisol, Plasma 10.7 ug/dL    Comment: (NOTE) AM    6.7 - 22.6 ug/dL PM   <10.0       ug/dL Performed at [place] [address], [place], [place] [address]   Basic metabolic panel     Status: Abnormal   Collection Time: 04/19/21 10:38 AM  Result Value Ref Range   Sodium 134 (L) 135 - 145 mmol/L   Potassium 2.5 (LL) 3.5 - 5.1 mmol/L    Comment: CRITICAL RESULT CALLED TO, READ BACK BY AND VERIFIED WITH: WELLS,M AT 11:00AM ON 04/19/21 BY FESTERMAN,C    Chloride 85 (L) 98 - 111 mmol/L   CO2 40 (H) 22 - 32 mmol/L   Glucose, Bld 127 (H) 70 - 99 mg/dL    Comment: Glucose reference range applies only to samples taken after fasting for at least 8 hours.   BUN 12 8 - 23 mg/dL   Creatinine, Ser 1.33 (H) 0.61 - 1.24 mg/dL   Calcium 8.5 (L) 8.9 - 10.3 mg/dL   GFR, Estimated >60 >60 mL/min    Comment: (NOTE) Calculated using the CKD-EPI Creatinine Equation (2021)    Anion gap 9 5 - 15    Comment: Performed at [facility], [address], [place], [place] [address]  CBC with Differential/Platelet     Status: Abnormal   Collection Time: 04/20/21  5:36 AM  Result Value Ref Range   WBC 2.3 (L) 4.0 - 10.5 K/uL   RBC 3.68 (L) 4.22 - 5.81 MIL/uL   Hemoglobin 11.0 (L) 13.0 - 17.0 g/dL   HCT 32.4 (L) 39.0 - 52.0 %   MCV 88.0 80.0 - 100.0 fL   MCH 29.9 26.0 - 34.0 pg   MCHC 34.0 30.0 - 36.0 g/dL   RDW 16.2 (H) 11.5 - 15.5 %   Platelets 56 (L) 150 - 400 K/uL    Comment: SPECIMEN CHECKED  FOR CLOTS Immature Platelet Fraction [place] be clinically indicated, consider ordering this additional test [id number] CONSISTENT WITH PREVIOUS RESULT    nRBC 0.0 0.0 - 0.2 %   Neutrophils Relative % 47 %   Neutro Abs 1.1 (L) 1.7 - 7.7 K/uL   Lymphocytes Relative 33 %   Lymphs Abs 0.8 0.7 - 4.0 K/uL   Monocytes Relative 13 %   Monocytes Absolute 0.3 0.1 - 1.0 K/uL   Eosinophils Relative 7 %   Eosinophils Absolute 0.2 0.0 - 0.5 K/uL   Basophils Relative 0 %    Basophils Absolute 0.0 0.0 - 0.1 K/uL   Immature Granulocytes 0 %   Abs Immature Granulocytes 0.00 0.00 - 0.07 K/uL    Comment: Performed at [facility], [address] [place], [place] [address]  Basic metabolic panel     Status: Abnormal   Collection Time: 04/20/21  5:36 AM  Result Value Ref Range   Sodium 136 135 - 145 mmol/L   Potassium 2.1 (LL) 3.5 - 5.1 mmol/L    Comment: CRITICAL RESULT CALLED TO, READ BACK BY AND VERIFIED WITH: JOHNSON,B AT 6:55AM ON 04/20/21 BY FESTERMAN,C    Chloride 85 (L) 98 - 111 mmol/L   CO2 42 (H) 22 - 32 mmol/L   Glucose, Bld 110 (H) 70 - 99 mg/dL    Comment: Glucose reference range applies only to samples taken after fasting for at least 8 hours.   BUN 14 8 - 23 mg/dL   Creatinine, Ser 1.32 (H) 0.61 - 1.24 mg/dL   Calcium 8.3 (L) 8.9 - 10.3 mg/dL   GFR, Estimated >60 >60 mL/min    Comment: (NOTE) Calculated using the CKD-EPI Creatinine Equation (2021)    Anion gap 9 5 - 15    Comment: Performed at [facility], [address] [place], [place] [address]  Magnesium     Status: None   Collection Time: 04/20/21  5:36 AM  Result Value Ref Range   Magnesium 1.9 1.7 - 2.4 mg/dL    Comment: Performed at [facility], [address], [place], [place] [address]    Chemistries  Recent Labs  Lab 04/18/21 1601 04/18/21 1615 04/19/21 0350 04/19/21 1038 04/20/21 0536  NA 133*  --  134* 134* 136  K 2.2*  --  2.1* 2.5* 2.1*  CL 87*  --  89* 85* 85*  CO2 37*  --  39* 40* 42*  GLUCOSE 135*  --  131* 127* 110*  BUN 14  --  13 12 14   CREATININE 1.19  --  1.34* 1.33* 1.32*  CALCIUM 8.2*  --  7.9* 8.5* 8.3*  MG  --  1.8 1.8  --  1.9  AST 48*  --  48*  --   --   ALT 19  --  20  --   --   ALKPHOS 76  --  67  --   --   BILITOT 2.0*  --  2.3*  --   --     ------------------------------------------------------------------------------------------------------------------  ------------------------------------------------------------------------------------------------------------------ GFR: Estimated Creatinine Clearance: 91.3 mL/min (A) (by C-G formula based on SCr of 1.32 mg/dL (H)). Liver Function Tests: Recent Labs  Lab 04/18/21 1601 04/19/21 0350  AST 48* 48*  ALT 19 20  ALKPHOS 76 67  BILITOT 2.0* 2.3*  PROT 6.7 6.7  ALBUMIN 3.0* 3.0*   Recent Labs  Lab 04/18/21 1601  LIPASE 48   No results for input(s): AMMONIA in the last 168 hours. Coagulation Profile: Recent Labs  Lab 04/19/21 0350  INR 1.3*   Cardiac Enzymes: No results for input(s): CKTOTAL, CKMB, CKMBINDEX, TROPONINI in the last 168 hours. BNP (last 3 results) No results for input(s): PROBNP in the last 8760 hours. HbA1C: No results for input(s): HGBA1C in the last 72 hours. CBG: No results for input(s): GLUCAP in the last 168 hours. Lipid Profile: No results for input(s): CHOL, HDL, LDLCALC, TRIG, CHOLHDL, LDLDIRECT in the last 72 hours. Thyroid Function Tests: No results for input(s): TSH, T4TOTAL, FREET4, T3FREE, THYROIDAB in the last 72 hours. Anemia Panel: No results for input(s): VITAMINB12, FOLATE, FERRITIN, TIBC, IRON, RETICCTPCT in the last 72 hours.  --------------------------------------------------------------------------------------------------------------- Urine analysis:    Component Value Date/Time   COLORURINE AMBER (A) 04/18/2021 1715   APPEARANCEUR CLEAR 04/18/2021 1715   LABSPEC 1.023 04/18/2021 1715   PHURINE 6.0 04/18/2021 1715   GLUCOSEU NEGATIVE 04/18/2021 1715   HGBUR NEGATIVE 04/18/2021 [place] 04/18/2021 [place] 04/18/2021 1715   PROTEINUR 30 (A) 04/18/2021 1715   NITRITE NEGATIVE 04/18/2021 1715   LEUKOCYTESUR NEGATIVE 04/18/2021 1715      Imaging Results:    CT ABDOMEN PELVIS  W CONTRAST  Result Date: 04/18/2021 CLINICAL DATA:  Right lower quadrant pain EXAM: CT ABDOMEN AND PELVIS WITH CONTRAST TECHNIQUE: Multidetector CT imaging of the abdomen and pelvis was performed using the standard protocol following bolus administration of intravenous contrast. RADIATION DOSE REDUCTION: This exam was performed according to the departmental dose-optimization program which includes automated exposure control, adjustment of the mA and/or kV according to patient size and/or use of iterative reconstruction technique. CONTRAST:  146mL OMNIPAQUE IOHEXOL 300 MG/ML  SOLN COMPARISON:  Ultrasound 03/22/2021, CT 03/23/2021 FINDINGS: Lower chest: No acute consolidation or pleural effusion. Mild cardiomegaly. Small gastroesophageal varices. Hepatobiliary: Liver cirrhosis. Status post cholecystectomy. No biliary dilatation Pancreas: Unremarkable. No pancreatic ductal dilatation or surrounding inflammatory changes. Spleen: Enlarged measuring 22 cm craniocaudad. Adrenals/Urinary Tract: Adrenal glands are unremarkable. Kidneys are normal, without focal lesion, or hydronephrosis. Small nonobstructing stone lower pole right kidney. Bladder is unremarkable. Stomach/Bowel: Stomach is within normal limits. Appendix appears normal. No evidence of bowel wall thickening, distention, or inflammatory changes. Vascular/Lymphatic: Nonaneurysmal aorta. Nonspecific retroperitoneal lymph nodes. Recanalized paraumbilical vein. Reproductive: Prostate is unremarkable. Other: No free air. Small amount of abdominopelvic ascites. Hazy edema and stranding within the mesentery and colic gutters. Large periumbilical hernia containing mesenteric fat. Small amount of fluid in the hernia sac. Musculoskeletal: No acute osseous abnormality. IMPRESSION: 1. Negative for acute appendicitis. 2. Liver cirrhosis with portal hypertension. Splenomegaly and small amount of abdominopelvic ascites. Small gastroesophageal varices. 3. Large periumbilical  hernia containing mesenteric fat and small amount of fluid 4. Nonobstructing right kidney stone Electronically Signed   By: Donavan Foil M.D.   On: 04/18/2021 19:18       Assessment & Plan:    Principal Problem:   Hypokalemia Active Problems:   Hypothyroidism   Thrombocytopenia (HCC)   Leukopenia   Prolonged QT interval   Hypokalemia Likely secondary to renal and GI losses secondary to Lasix and rifaximin Replace and recheck Monitor on telemetry Check magnesium and replete to keep above 1.8 Placed on p.o. and IV repletion until maintained above 2.5 Cut Lasix in half from 80 mg to 40 mg p.o. daily Prolonged QT Likely related to hypokalemia Monitor on telemetry Likely to improve with K+ repletion Thyroid disease Continue synthroid GERD Continue protonix NASH Continue Rifaximin    DVT Prophylaxis-    SCDs  AM Labs Ordered, also please review Full Orders  Family Communication: No family at bedside  Code Status: Full   Renay Crammer A DO

## 2021-04-20 NOTE — Progress Notes (Signed)
Date and time results received: 04/20/21 1805  (use smartphrase ".now" to insert current time)  Test: Potassium Critical Value: 2.3  Name of Provider Notified: MD Shanon Brow  Orders Received? Or Actions Taken?:  No new orders received at this time

## 2021-04-21 LAB — CBC WITH DIFFERENTIAL/PLATELET
Abs Immature Granulocytes: 0.01 10*3/uL (ref 0.00–0.07)
Basophils Absolute: 0 10*3/uL (ref 0.0–0.1)
Basophils Relative: 1 %
Eosinophils Absolute: 0.2 10*3/uL (ref 0.0–0.5)
Eosinophils Relative: 7 %
HCT: 34.7 % — ABNORMAL LOW (ref 39.0–52.0)
Hemoglobin: 11.2 g/dL — ABNORMAL LOW (ref 13.0–17.0)
Immature Granulocytes: 0 %
Lymphocytes Relative: 31 %
Lymphs Abs: 0.8 10*3/uL (ref 0.7–4.0)
MCH: 27.9 pg (ref 26.0–34.0)
MCHC: 32.3 g/dL (ref 30.0–36.0)
MCV: 86.5 fL (ref 80.0–100.0)
Monocytes Absolute: 0.3 10*3/uL (ref 0.1–1.0)
Monocytes Relative: 14 %
Neutro Abs: 1.1 10*3/uL — ABNORMAL LOW (ref 1.7–7.7)
Neutrophils Relative %: 47 %
Platelets: 66 10*3/uL — ABNORMAL LOW (ref 150–400)
RBC: 4.01 MIL/uL — ABNORMAL LOW (ref 4.22–5.81)
RDW: 16 % — ABNORMAL HIGH (ref 11.5–15.5)
WBC: 2.4 10*3/uL — ABNORMAL LOW (ref 4.0–10.5)
nRBC: 0 % (ref 0.0–0.2)

## 2021-04-21 LAB — BASIC METABOLIC PANEL
Anion gap: 12 (ref 5–15)
BUN: 15 mg/dL (ref 8–23)
CO2: 40 mmol/L — ABNORMAL HIGH (ref 22–32)
Calcium: 8.5 mg/dL — ABNORMAL LOW (ref 8.9–10.3)
Chloride: 85 mmol/L — ABNORMAL LOW (ref 98–111)
Creatinine, Ser: 1.27 mg/dL — ABNORMAL HIGH (ref 0.61–1.24)
GFR, Estimated: >60 mL/min (ref >60)
Glucose, Bld: 105 mg/dL — ABNORMAL HIGH (ref 70–99)
Potassium: 2.3 mmol/L — CL (ref 3.5–5.1)
Sodium: 137 mmol/L (ref 135–145)

## 2021-04-21 LAB — URINALYSIS, COMPLETE (UACMP) WITH MICROSCOPIC
Bacteria, UA: NONE SEEN
Bilirubin Urine: NEGATIVE
Glucose, UA: NEGATIVE mg/dL
Hgb urine dipstick: NEGATIVE
Ketones, ur: NEGATIVE mg/dL
Leukocytes,Ua: NEGATIVE
Nitrite: NEGATIVE
Protein, ur: NEGATIVE mg/dL
Specific Gravity, Urine: 1.015 (ref 1.005–1.030)
pH: 6.5 (ref 5.0–8.0)

## 2021-04-21 LAB — TSH: TSH: 4.704 u[IU]/mL — ABNORMAL HIGH (ref 0.350–4.500)

## 2021-04-21 LAB — CREATININE, URINE, RANDOM: Creatinine, Urine: 120.68 mg/dL

## 2021-04-21 LAB — NA AND K (SODIUM & POTASSIUM), RAND UR
Potassium Urine: 57 mmol/L
Sodium, Ur: 31 mmol/L

## 2021-04-21 MED ORDER — POTASSIUM CHLORIDE 10 MEQ/100ML IV SOLN
INTRAVENOUS | Status: AC
  Filled 2021-04-21: qty 100

## 2021-04-21 MED ORDER — POTASSIUM CHLORIDE 10 MEQ/100ML IV SOLN
10.0000 meq | INTRAVENOUS | Status: AC
  Administered 2021-04-21 (×6): 10 meq via INTRAVENOUS
  Filled 2021-04-21 (×5): qty 100

## 2021-04-21 MED ORDER — SPIRONOLACTONE 25 MG PO TABS
50.0000 mg | ORAL_TABLET | Freq: Every day | ORAL | Status: DC
  Administered 2021-04-21 – 2021-04-23 (×3): 50 mg via ORAL
  Filled 2021-04-21 (×3): qty 2

## 2021-04-21 MED ORDER — POTASSIUM CHLORIDE 10 MEQ/100ML IV SOLN: 10.0000 meq | INTRAVENOUS | Status: DC

## 2021-04-21 NOTE — Care Management Important Message (Signed)
Important Message  Patient Details  Name: Daniel Crawford MRN: [id number] Date of Birth: 28-May-1955   Medicare Important Message Given:  Yes - Important Message mailed due to current National Emergency     Tommy Medal 04/21/2021, 4:02 PM

## 2021-04-21 NOTE — Consult Note (Signed)
Reason for Consult: Refractory Hypokalemia Referring Physician: Derrill Kay, MD  Daniel Crawford is an 66 y.o. male with a PMH significant for liver cirrhosis due to NASH, DM type 2, hypothyroidism, chronic diastolic CHF, OSA, CKD stage IIIa, and recurrent hypokalemia who was presented to [facility] ED on 04/18/21 a one week history of worsening right sided abdominal pain.  He also complained of nause but no vomiting or diarrhea.  No fevers or chills.  In the ED he was afebrile and vital signs were stable.  Labs were notable for WBC 2.7, Hgb 11.7, plt 54, Na 133, K 2.2, Ca 8.2, alb 3, AST 48, T bili 2, Co2 37.  CT of abdomen and pelvis revealed cirrhosis with portal hypertension, small gastroesophageal varices, splenomegaly, and small amount of ascites.  Also noted to have larger periumbilical hernia and non-obstructing right kidney stone.  He was admitted for management of severe hypokalemia.   Of note, he was taking furosemide 80 mg daily and metolazone 5 mg daily.  He received 120 mEq of KCl, however K levels remained 2-2.5.  We were consulted to assist with management his refractory hypokalemia.   He denies any N/V/D.  He had a similar presentation during his last hospitalization at [facility] on 1/1-03/29/21.  Trend in Creatinine: Creatinine, Ser  Date/Time Value Ref Range Status  04/21/2021 05:09 AM 1.27 (H) 0.61 - 1.24 mg/dL Final  04/20/2021 07:05 PM 1.50 (H) 0.61 - 1.24 mg/dL Final  04/20/2021 05:05 PM 1.36 (H) 0.61 - 1.24 mg/dL Final  04/20/2021 05:36 AM 1.32 (H) 0.61 - 1.24 mg/dL Final  04/19/2021 10:38 AM 1.33 (H) 0.61 - 1.24 mg/dL Final  04/19/2021 03:50 AM 1.34 (H) 0.61 - 1.24 mg/dL Final  04/18/2021 04:01 PM 1.19 0.61 - 1.24 mg/dL Final  06/20/2020 10:51 AM 1.10 0.61 - 1.24 mg/dL Final  05/31/2020 12:28 PM 1.28 (H) 0.61 - 1.24 mg/dL Final  02/18/2017 11:41 AM 0.94 0.61 - 1.24 mg/dL Final  01/15/2017 11:32 AM 0.85 0.61 - 1.24 mg/dL Final    PMH:   Past Medical History:  Diagnosis Date    CKD (chronic kidney disease)    Depression    Hypothyroidism    Liver cirrhosis secondary to NASH ([place])    NASH (nonalcoholic steatohepatitis)    Spleen enlarged    Thrombocytopenia (HCC)     PSH:   Past Surgical History:  Procedure Laterality Date   Bilateral hernia surgery     2006, 2007   CHOLECYSTECTOMY     2016    ESOPHAGEAL DILATION N/A 11/06/2019   Procedure: ESOPHAGEAL DILATION;  Surgeon: Harvel Quale, MD;  [facility];  Service: Gastroenterology;  Laterality: N/A;   ESOPHAGOGASTRODUODENOSCOPY (EGD) WITH PROPOFOL N/A 11/06/2019   Procedure: ESOPHAGOGASTRODUODENOSCOPY (EGD) WITH PROPOFOL;  Surgeon: Harvel Quale, MD;  [facility];  Service: Gastroenterology;  Laterality: N/A;  1045   Polyp removed     in January of 2018.     Allergies: No Known Allergies  Medications:   Prior to Admission medications   Medication Sig Start Date End Date Taking? Authorizing Provider  Acetaminophen 500 MG capsule Take 1,000 mg by mouth every 6 (six) hours as needed for pain.   Yes [provider]  albuterol (VENTOLIN HFA) 108 (90 Base) MCG/ACT inhaler TAKE 2 PUFFS BY MOUTH EVERY 6 HOURS AS NEEDED FOR WHEEZE OR SHORTNESS OF BREATH Patient taking differently: Inhale 2 puffs into the lungs every 6 (six) hours as needed for shortness of breath.  12/28/20  Yes Rigoberto Noel, MD  ascorbic acid (VITAMIN C) 500 MG tablet Take by mouth. 03/30/21 03/30/22 Yes [provider]  carvedilol (COREG) 12.5 MG tablet Take 12.5 mg by mouth 2 (two) times daily with a meal.   Yes [provider]  Cholecalciferol 25 MCG (1000 UT) tablet Take by mouth. 03/30/21 04/29/21 Yes [provider]  furosemide (LASIX) 20 MG tablet Take 80 mg by mouth daily.   Yes [provider]  gabapentin (NEURONTIN) 100 MG capsule Take 100 mg by mouth at bedtime. 02/20/21  Yes [provider]  hydrOXYzine (VISTARIL) 50 MG capsule Take 50 mg by  mouth in the morning and at bedtime.   Yes [provider]  levothyroxine (SYNTHROID) 175 MCG tablet Take 175 mcg by mouth daily before breakfast.   Yes [provider]  metolazone (ZAROXOLYN) 5 MG tablet Take 5 mg by mouth daily.   Yes [provider]  pantoprazole (PROTONIX) 40 MG tablet Take 40 mg by mouth daily.   Yes [provider]  Potassium Chloride ER 20 MEQ TBCR Take 1 tablet by mouth 3 (three) times daily. 06/17/20  Yes [provider]  rifaximin (XIFAXAN) 550 MG TABS tablet Take 550 mg by mouth 2 (two) times daily.   Yes [provider]  sertraline (ZOLOFT) 25 MG tablet Take 25 mg by mouth at bedtime.   Yes [provider]  traZODone (DESYREL) 50 MG tablet Take 50 mg by mouth at bedtime.   Yes [provider]  zinc gluconate 50 MG tablet Take 1 tablet by mouth daily. 03/30/21 03/30/22 Yes [provider]  lactulose (CHRONULAC) 10 GM/15ML solution Take 15 mLs (10 g total) by mouth 3 (three) times daily. Patient not taking: Reported on 04/18/2021 10/17/16   Butch Penny, NP    Inpatient medications:  carvedilol  12.5 mg Oral BID WC   furosemide  40 mg Oral Daily   gabapentin  100 mg Oral QHS   levothyroxine  175 mcg Oral QAC breakfast   metolazone  5 mg Oral Daily   pantoprazole  40 mg Oral Daily   potassium chloride  30 mEq Oral TID   rifaximin  550 mg Oral BID   sertraline  25 mg Oral QHS   traZODone  50 mg Oral QHS    Discontinued Meds:   Medications Discontinued During This Encounter  Medication Reason   levothyroxine (SYNTHROID, LEVOTHROID) 200 MCG tablet Patient Preference   levothyroxine (SYNTHROID) 25 MCG tablet Patient Preference   HYDROcodone-acetaminophen (NORCO) 10-325 MG tablet Completed Course   carvedilol (COREG) tablet 12.5 mg    furosemide (LASIX) tablet 80 mg    potassium chloride 10 mEq in 100 mL IVPB     Social History:  reports that he quit smoking about 6 years ago. His  smoking use included cigarettes. He has a 20.00 pack-year smoking history. He has never used smokeless tobacco. He reports that he does not drink alcohol and does not use drugs.  Family History:   Family History  Problem Relation Age of Onset   Liver cancer Mother    Heart disease Mother    Aneurysm Sister     Pertinent items are noted in HPI. Weight change:   Intake/Output Summary (Last 24 hours) at 04/21/2021 0943 Last data filed at 04/21/2021 0759 Gross per 24 hour  Intake 1080 ml  Output 250 ml  Net 830 ml   BP 113/60 (BP Location: Right Arm)  Pulse 60    Temp 97.7 F (36.5 C) (Oral)    Resp 20    Ht 6\' 1"  (1.854 m)    Wt (!) 161.9 kg    SpO2 91%    BMI 47.10 kg/m  Vitals:   04/20/21 0900 04/20/21 1308 04/20/21 2000 04/21/21 0501  BP:  (!) 107/54 (!) 108/59 113/60  Pulse: 63 61  60  Resp:  16 19 20   Temp:  98 F (36.7 C) 98.2 F (36.8 C) 97.7 F (36.5 C)  TempSrc:  Oral Oral Oral  SpO2:  97% 92% 91%  Weight:      Height:         General appearance: fatigued, no distress, and moderately obese Head: Normocephalic, without obvious abnormality, atraumatic Resp: clear to auscultation bilaterally Cardio: regular rate and rhythm, S1, S2 normal, no murmur, click, rub or gallop GI: obese, + umbilical hernia, +BS, soft, no guarding or rebound Extremities: edema trace pretibial edema bilateral lower extremities  Labs: Basic Metabolic Panel: Recent Labs  Lab 04/18/21 1601 04/19/21 0350 04/19/21 1038 04/20/21 0536 04/20/21 1705 04/20/21 1905 04/21/21 0509  NA 133* 134* 134* 136 134* 131* 137  K 2.2* 2.1* 2.5* 2.1* 2.3* 2.0* 2.3*  CL 87* 89* 85* 85* 86* 85* 85*  CO2 37* 39* 40* 42* 39* 44* 40*  GLUCOSE 135* 131* 127* 110* 139* 165* 105*  BUN 14 13 12 14 13 14 15   CREATININE 1.19 1.34* 1.33* 1.32* 1.36* 1.50* 1.27*  ALBUMIN 3.0* 3.0*  --   --   --   --   --   CALCIUM 8.2* 7.9* 8.5* 8.3* 8.1* 7.8* 8.5*   Liver Function Tests: Recent Labs  Lab 04/18/21 1601  04/19/21 0350  AST 48* 48*  ALT 19 20  ALKPHOS 76 67  BILITOT 2.0* 2.3*  PROT 6.7 6.7  ALBUMIN 3.0* 3.0*   Recent Labs  Lab 04/18/21 1601  LIPASE 48   No results for input(s): AMMONIA in the last 168 hours. CBC: Recent Labs  Lab 04/18/21 1601 04/19/21 0350 04/20/21 0536 04/21/21 0509  WBC 2.7* 2.5* 2.3* 2.4*  NEUTROABS 1.4* 1.3* 1.1* 1.1*  HGB 11.7* 11.5* 11.0* 11.2*  HCT 35.4* 33.9* 32.4* 34.7*  MCV 86.3 88.1 88.0 86.5  PLT 54* 56* 56* 66*   PT/INR: @LABRCNTIP (inr:5) Cardiac Enzymes: )No results for input(s): CKTOTAL, CKMB, CKMBINDEX, TROPONINI in the last 168 hours. CBG: No results for input(s): GLUCAP in the last 168 hours.  Iron Studies: No results for input(s): IRON, TIBC, TRANSFERRIN, FERRITIN in the last 168 hours.  Xrays/Other Studies: No results found.   Assessment/Plan:  Hypokalemia - likely due to combination of furosemide and metolazone.  With low K of 2.2, his total body depletion of K is quite large.  Mg level was normal at 1.9 Continue with IV KCl x 6 runs.   Will stop lasix and metolazone and start aldactone 50 mg and follow.   Will also check renin and aldo levels, and TSH for completeness.  Continue to follow K levels.  Pancytopenia - leukopenia and thrombocytopenia, presumably due to NASH cirrhosis.  Consider Hematology consult. NASH cirrhosis - continue with rifaximin. Hypothyroidism - check TSH Abdominal pain - has improved.  Doubt SBP given small amount of ascites.   Daniel Crawford 04/21/2021, 9:43 AM

## 2021-04-21 NOTE — Plan of Care (Signed)

## 2021-04-21 NOTE — Progress Notes (Signed)
Patient ID: Daniel Crawford, male   DOB: 12-06-1955, 66 y.o.   MRN: [id number]                                                                               HPI:    Daniel Crawford  is a 66 y.o. male, with history of CKD, depression, hypothyroidism, liver cirrhosis secondary to [facility], thrombocytopenia, leukopenia, and more presents the ED with a chief complaint of abdominal pain.  Patient reports lower right-sided abdominal pain radiating to the right upper quadrant and around to the back at the level of the right upper quadrant.  Patient reports this has been going on for a long time.  This episode has been going on for about a week.  It is sharp/stabbing pain.  Does not associated with mealtime.  It is not associated with bowel movements.  He has no nausea or vomiting.  No fevers.  His last normal meal was 2 PM he reports his appetite is intact.  Patient reports that this pain never really goes away, just becomes tolerable between flares.  He reports he has never been given a reason that he has this pain.  He suspects it [place] be related to his liver cirrhosis.  Patient reports hypokalemia is something he has been battling for a while as well.  He takes potassium supplementation 3 times a day at home.  He also takes between 40 and 80 mg of Lasix at home.  He reports has been compliant with both these medications.  On review of system patient admits to chronic exertional shortness of breath.  He denies any palpitations, chest pain, dysuria, hematuria, diarrhea, other pains.  Patient does not smoke, does not drink alcohol, does not use illicit drugs.  He is vaccinated for COVID.  Patient is full code.  In the ED  Temp 98.8, heart rate 73-79, respiratory rate 12-20, blood pressure 130/68, satting at 91% Patient is leukopenic at 2.7-chronically low, hemoglobin 17.7, platelets 54 Sodium 133, calcium 2.2, chloride 87, bicarb 37, creatinine at baseline, glucose 135 UA is not indicative of UTI CT abdomen pelvis shows no acute  appendicitis.  Liver cirrhosis with portal hypertension.  Small gastroesophageal varices. EKG shows a heart rate 76, QTc 643 sinus rhythm Lipase 48 Patient was given 70 mEq total of potassium in the ED, morphine, Zofran Admission requested for further work-up and management of severe hypokalemia  Subjective Wants to go home he has a dog at home he needs to take care of   Physical Exam:   Vitals  Blood pressure 113/60, pulse 60, temperature 97.7 F (36.5 C), temperature source Oral, resp. rate 20, height 6\' 1"  (1.854 m), weight (!) 161.9 kg, SpO2 91 %.   1.  General: Patient lying supine in bed,  no acute distress   2. Psychiatric: Alert and oriented x 3, mood and behavior normal for situation, pleasant and cooperative with exam   3. Neurologic: Speech and language are normal, face is symmetric, moves all 4 extremities voluntarily, at baseline without acute deficits on limited exam   4. HEENMT:  Head is atraumatic, normocephalic, pupils reactive to light, neck is supple, trachea is midline, mucous membranes are moist  5. Respiratory : Lungs are clear to auscultation bilaterally without wheezing, rhonchi, rales, no cyanosis, no increase in work of breathing or accessory muscle use   6. Cardiovascular : Heart rate normal, rhythm is regular, no murmurs, rubs or gallops, no peripheral edema, peripheral pulses palpated   7. Gastrointestinal:  Abdomen is soft, nondistended, tender to the right upper quadrant most exquisitely, less tender to the right lower quadrant, also tender in the epigastric region, bowel sounds active,    8. Skin:  Skin is warm, dry and intact without rashes, acute lesions, or ulcers on limited exam   9.Musculoskeletal:  No acute deformities or trauma, no asymmetry in tone, no peripheral edema, peripheral pulses palpated, no tenderness to palpation in the extremities     Data Review:    CBC Recent Labs  Lab 04/18/21 1601 04/19/21 0350 04/20/21 0536  04/21/21 0509  WBC 2.7* 2.5* 2.3* 2.4*  HGB 11.7* 11.5* 11.0* 11.2*  HCT 35.4* 33.9* 32.4* 34.7*  PLT 54* 56* 56* 66*  MCV 86.3 88.1 88.0 86.5  MCH 28.5 29.9 29.9 27.9  MCHC 33.1 33.9 34.0 32.3  RDW 16.4* 16.7* 16.2* 16.0*  LYMPHSABS 0.8 0.7 0.8 0.8  MONOABS 0.3 0.3 0.3 0.3  EOSABS 0.2 0.2 0.2 0.2  BASOSABS 0.0 0.0 0.0 0.0   ------------------------------------------------------------------------------------------------------------------  Results for orders placed or performed during the hospital encounter of 04/18/21 (from the past 48 hour(s))  Cortisol, Random     Status: None   Collection Time: 04/19/21 10:38 AM  Result Value Ref Range   Cortisol, Plasma 10.7 ug/dL    Comment: (NOTE) AM    6.7 - 22.6 ug/dL PM   <10.0       ug/dL Performed at [facility], [address], [place], [place] [address]   Basic metabolic panel     Status: Abnormal   Collection Time: 04/19/21 10:38 AM  Result Value Ref Range   Sodium 134 (L) 135 - 145 mmol/L   Potassium 2.5 (LL) 3.5 - 5.1 mmol/L    Comment: CRITICAL RESULT CALLED TO, READ BACK BY AND VERIFIED WITH: WELLS,M AT 11:00AM ON 04/19/21 BY FESTERMAN,C    Chloride 85 (L) 98 - 111 mmol/L   CO2 40 (H) 22 - 32 mmol/L   Glucose, Bld 127 (H) 70 - 99 mg/dL    Comment: Glucose reference range applies only to samples taken after fasting for at least 8 hours.   BUN 12 8 - 23 mg/dL   Creatinine, Ser 1.33 (H) 0.61 - 1.24 mg/dL   Calcium 8.5 (L) 8.9 - 10.3 mg/dL   GFR, Estimated >60 >60 mL/min    Comment: (NOTE) Calculated using the CKD-EPI Creatinine Equation (2021)    Anion gap 9 5 - 15    Comment: Performed at [facility], [address] [place], [place] [address]  CBC with Differential/Platelet     Status: Abnormal   Collection Time: 04/20/21  5:36 AM  Result Value Ref Range   WBC 2.3 (L) 4.0 - 10.5 K/uL   RBC 3.68 (L) 4.22 - 5.81 MIL/uL   Hemoglobin 11.0 (L) 13.0 - 17.0 g/dL   HCT 32.4 (L) 39.0 - 52.0 %   MCV 88.0 80.0 -  100.0 fL   MCH 29.9 26.0 - 34.0 pg   MCHC 34.0 30.0 - 36.0 g/dL   RDW 16.2 (H) 11.5 - 15.5 %   Platelets 56 (L) 150 - 400 K/uL    Comment: SPECIMEN CHECKED FOR CLOTS Immature Platelet Fraction  [place] be clinically indicated, consider ordering this additional test [id number] CONSISTENT WITH PREVIOUS RESULT    nRBC 0.0 0.0 - 0.2 %   Neutrophils Relative % 47 %   Neutro Abs 1.1 (L) 1.7 - 7.7 K/uL   Lymphocytes Relative 33 %   Lymphs Abs 0.8 0.7 - 4.0 K/uL   Monocytes Relative 13 %   Monocytes Absolute 0.3 0.1 - 1.0 K/uL   Eosinophils Relative 7 %   Eosinophils Absolute 0.2 0.0 - 0.5 K/uL   Basophils Relative 0 %   Basophils Absolute 0.0 0.0 - 0.1 K/uL   Immature Granulocytes 0 %   Abs Immature Granulocytes 0.00 0.00 - 0.07 K/uL    Comment: Performed at [facility], [address] [place], [place] [address]  Basic metabolic panel     Status: Abnormal   Collection Time: 04/20/21  5:36 AM  Result Value Ref Range   Sodium 136 135 - 145 mmol/L   Potassium 2.1 (LL) 3.5 - 5.1 mmol/L    Comment: CRITICAL RESULT CALLED TO, READ BACK BY AND VERIFIED WITH: JOHNSON,B AT 6:55AM ON 04/20/21 BY FESTERMAN,C    Chloride 85 (L) 98 - 111 mmol/L   CO2 42 (H) 22 - 32 mmol/L   Glucose, Bld 110 (H) 70 - 99 mg/dL    Comment: Glucose reference range applies only to samples taken after fasting for at least 8 hours.   BUN 14 8 - 23 mg/dL   Creatinine, Ser 1.32 (H) 0.61 - 1.24 mg/dL   Calcium 8.3 (L) 8.9 - 10.3 mg/dL   GFR, Estimated >60 >60 mL/min    Comment: (NOTE) Calculated using the CKD-EPI Creatinine Equation (2021)    Anion gap 9 5 - 15    Comment: Performed at [facility], [address] [place], [place] [address]  Magnesium     Status: None   Collection Time: 04/20/21  5:36 AM  Result Value Ref Range   Magnesium 1.9 1.7 - 2.4 mg/dL    Comment: Performed at [facility], [address], [place], [place] [address]  Basic metabolic panel     Status: Abnormal   Collection Time: 04/20/21  5:05  PM  Result Value Ref Range   Sodium 134 (L) 135 - 145 mmol/L   Potassium 2.3 (LL) 3.5 - 5.1 mmol/L    Comment: CRITICAL RESULT CALLED TO, READ BACK BY AND VERIFIED WITH: TO MILLS, M. AT 1803 ON 04/20/21 BY BROWNING, D.    Chloride 86 (L) 98 - 111 mmol/L   CO2 39 (H) 22 - 32 mmol/L   Glucose, Bld 139 (H) 70 - 99 mg/dL    Comment: Glucose reference range applies only to samples taken after fasting for at least 8 hours.   BUN 13 8 - 23 mg/dL   Creatinine, Ser 1.36 (H) 0.61 - 1.24 mg/dL   Calcium 8.1 (L) 8.9 - 10.3 mg/dL   GFR, Estimated 58 (L) >60 mL/min    Comment: (NOTE) Calculated using the CKD-EPI Creatinine Equation (2021)    Anion gap 9 5 - 15    Comment: Performed at [facility], [address], [place], [place] [address]  Basic metabolic panel     Status: Abnormal   Collection Time: 04/20/21  7:05 PM  Result Value Ref Range   Sodium 131 (L) 135 - 145 mmol/L   Potassium 2.0 (LL) 3.5 - 5.1 mmol/L    Comment: CRITICAL RESULT CALLED TO, READ BACK BY AND VERIFIED WITH: MUSE,T AT 1944 ON 1.26.23 BY  RUCINSKI,B    Chloride 85 (L) 98 - 111 mmol/L   CO2 44 (H) 22 - 32 mmol/L   Glucose, Bld 165 (H) 70 - 99 mg/dL    Comment: Glucose reference range applies only to samples taken after fasting for at least 8 hours.   BUN 14 8 - 23 mg/dL   Creatinine, Ser 1.50 (H) 0.61 - 1.24 mg/dL   Calcium 7.8 (L) 8.9 - 10.3 mg/dL   GFR, Estimated 52 (L) >60 mL/min    Comment: (NOTE) Calculated using the CKD-EPI Creatinine Equation (2021)    Anion gap 2 (L) 5 - 15    Comment: RERAN Performed at [facility], [address] [place], [place] [address]   CBC with Differential/Platelet     Status: Abnormal   Collection Time: 04/21/21  5:09 AM  Result Value Ref Range   WBC 2.4 (L) 4.0 - 10.5 K/uL   RBC 4.01 (L) 4.22 - 5.81 MIL/uL   Hemoglobin 11.2 (L) 13.0 - 17.0 g/dL   HCT 34.7 (L) 39.0 - 52.0 %   MCV 86.5 80.0 - 100.0 fL   MCH 27.9 26.0 - 34.0 pg   MCHC 32.3 30.0 - 36.0 g/dL   RDW 16.0 (H)  11.5 - 15.5 %   Platelets 66 (L) 150 - 400 K/uL    Comment: SPECIMEN CHECKED FOR CLOTS Immature Platelet Fraction Tat[place] be clinically indicated, consider ordering this additional test [id number] CONSISTENT WITH PREVIOUS RESULT REPEATED TO VERIFY    nRBC 0.0 0.0 - 0.2 %   Neutrophils Relative % 47 %   Neutro Abs 1.1 (L) 1.7 - 7.7 K/uL   Lymphocytes Relative 31 %   Lymphs Abs 0.8 0.7 - 4.0 K/uL   Monocytes Relative 14 %   Monocytes Absolute 0.3 0.1 - 1.0 K/uL   Eosinophils Relative 7 %   Eosinophils Absolute 0.2 0.0 - 0.5 K/uL   Basophils Relative 1 %   Basophils Absolute 0.0 0.0 - 0.1 K/uL   WBC Morphology MORPHOLOGY UNREMARKABLE    RBC Morphology MORPHOLOGY UNREMARKABLE    Smear Review MORPHOLOGY UNREMARKABLE    Immature Granulocytes 0 %   Abs Immature Granulocytes 0.01 0.00 - 0.07 K/uL    Comment: Performed at [facility], [address] [place], [place] [address]  Basic metabolic panel     Status: Abnormal   Collection Time: 04/21/21  5:09 AM  Result Value Ref Range   Sodium 137 135 - 145 mmol/L   Potassium 2.3 (LL) 3.5 - 5.1 mmol/L    Comment: CRITICAL RESULT CALLED TO, READ BACK BY AND VERIFIED WITH: TO MUSE, J. AT 8250 ON 04/21/21 BY BROWNING, D.    Chloride 85 (L) 98 - 111 mmol/L   CO2 40 (H) 22 - 32 mmol/L   Glucose, Bld 105 (H) 70 - 99 mg/dL    Comment: Glucose reference range applies only to samples taken after fasting for at least 8 hours.   BUN 15 8 - 23 mg/dL   Creatinine, Ser 1.27 (H) 0.61 - 1.24 mg/dL   Calcium 8.5 (L) 8.9 - 10.3 mg/dL   GFR, Estimated >60 >60 mL/min    Comment: (NOTE) Calculated using the CKD-EPI Creatinine Equation (2021)    Anion gap 12 5 - 15    Comment: Performed at [facility], [address] [place], [place] [address]    Chemistries  Recent Labs  Lab 04/18/21 1601 04/18/21 1615 04/19/21 0350 04/19/21 1038 04/20/21 0536 04/20/21 1705 04/20/21 1905 04/21/21 0509  NA  133*  --  134* 134* 136 134* 131* 137  K 2.2*  --  2.1*  2.5* 2.1* 2.3* 2.0* 2.3*  CL 87*  --  89* 85* 85* 86* 85* 85*  CO2 37*  --  39* 40* 42* 39* 44* 40*  GLUCOSE 135*  --  131* 127* 110* 139* 165* 105*  BUN 14  --  13 12 14 13 14 15   CREATININE 1.19  --  1.34* 1.33* 1.32* 1.36* 1.50* 1.27*  CALCIUM 8.2*  --  7.9* 8.5* 8.3* 8.1* 7.8* 8.5*  MG  --  1.8 1.8  --  1.9  --   --   --   AST 48*  --  48*  --   --   --   --   --   ALT 19  --  20  --   --   --   --   --   ALKPHOS 76  --  67  --   --   --   --   --   BILITOT 2.0*  --  2.3*  --   --   --   --   --    ------------------------------------------------------------------------------------------------------------------  ------------------------------------------------------------------------------------------------------------------ GFR: Estimated Creatinine Clearance: 94.9 mL/min (A) (by C-G formula based on SCr of 1.27 mg/dL (H)). Liver Function Tests: Recent Labs  Lab 04/18/21 1601 04/19/21 0350  AST 48* 48*  ALT 19 20  ALKPHOS 76 67  BILITOT 2.0* 2.3*  PROT 6.7 6.7  ALBUMIN 3.0* 3.0*   Recent Labs  Lab 04/18/21 1601  LIPASE 48   No results for input(s): AMMONIA in the last 168 hours. Coagulation Profile: Recent Labs  Lab 04/19/21 0350  INR 1.3*   Cardiac Enzymes: No results for input(s): CKTOTAL, CKMB, CKMBINDEX, TROPONINI in the last 168 hours. BNP (last 3 results) No results for input(s): PROBNP in the last 8760 hours. HbA1C: No results for input(s): HGBA1C in the last 72 hours. CBG: No results for input(s): GLUCAP in the last 168 hours. Lipid Profile: No results for input(s): CHOL, HDL, LDLCALC, TRIG, CHOLHDL, LDLDIRECT in the last 72 hours. Thyroid Function Tests: No results for input(s): TSH, T4TOTAL, FREET4, T3FREE, THYROIDAB in the last 72 hours. Anemia Panel: No results for input(s): VITAMINB12, FOLATE, FERRITIN, TIBC, IRON, RETICCTPCT in the last 72  hours.  --------------------------------------------------------------------------------------------------------------- Urine analysis:    Component Value Date/Time   COLORURINE AMBER (A) 04/18/2021 1715   APPEARANCEUR CLEAR 04/18/2021 1715   LABSPEC 1.023 04/18/2021 1715   PHURINE 6.0 04/18/2021 1715   GLUCOSEU NEGATIVE 04/18/2021 1715   HGBUR NEGATIVE 04/18/2021 [place] 04/18/2021 1715   [place] 04/18/2021 1715   PROTEINUR 30 (A) 04/18/2021 1715   NITRITE NEGATIVE 04/18/2021 1715   LEUKOCYTESUR NEGATIVE 04/18/2021 1715      Imaging Results:    No results found.     Assessment & Plan:    Principal Problem:   Hypokalemia Active Problems:   Hypothyroidism   Thrombocytopenia (HCC)   Leukopenia   Prolonged QT interval   Hypokalemia Likely secondary to renal and GI losses secondary to Lasix and rifaximin Replace and recheck Monitor on telemetry Check magnesium and replete to keep above 1.8 Placed on p.o. and IV repletion until maintained above 2.5 Cut Lasix in half from 80 mg to 40 mg p.o. daily Obtain nephrology input Prolonged QT Likely related to hypokalemia Monitor on telemetry Likely to improve with K+ repletion Thyroid disease Continue synthroid GERD Continue protonix NASH Continue  Rifaximin    Kylar Speelman A MD

## 2021-04-22 LAB — CBC WITH DIFFERENTIAL/PLATELET
Abs Immature Granulocytes: 0.02 10*3/uL (ref 0.00–0.07)
Basophils Absolute: 0 10*3/uL (ref 0.0–0.1)
Basophils Relative: 1 %
Eosinophils Absolute: 0.2 10*3/uL (ref 0.0–0.5)
Eosinophils Relative: 6 %
HCT: 34.9 % — ABNORMAL LOW (ref 39.0–52.0)
Hemoglobin: 11.5 g/dL — ABNORMAL LOW (ref 13.0–17.0)
Immature Granulocytes: 1 %
Lymphocytes Relative: 34 %
Lymphs Abs: 0.8 10*3/uL (ref 0.7–4.0)
MCH: 28.8 pg (ref 26.0–34.0)
MCHC: 33 g/dL (ref 30.0–36.0)
MCV: 87.3 fL (ref 80.0–100.0)
Monocytes Absolute: 0.3 10*3/uL (ref 0.1–1.0)
Monocytes Relative: 13 %
Neutro Abs: 1.1 10*3/uL — ABNORMAL LOW (ref 1.7–7.7)
Neutrophils Relative %: 45 %
Platelets: 74 10*3/uL — ABNORMAL LOW (ref 150–400)
RBC: 4 MIL/uL — ABNORMAL LOW (ref 4.22–5.81)
RDW: 16.3 % — ABNORMAL HIGH (ref 11.5–15.5)
WBC: 2.5 10*3/uL — ABNORMAL LOW (ref 4.0–10.5)
nRBC: 0 % (ref 0.0–0.2)

## 2021-04-22 LAB — BASIC METABOLIC PANEL
Anion gap: 9 (ref 5–15)
Anion gap: 6 (ref 5–15)
BUN: 14 mg/dL (ref 8–23)
BUN: 13 mg/dL (ref 8–23)
CO2: 37 mmol/L — ABNORMAL HIGH (ref 22–32)
CO2: 35 mmol/L — ABNORMAL HIGH (ref 22–32)
Calcium: 8.6 mg/dL — ABNORMAL LOW (ref 8.9–10.3)
Calcium: 8.2 mg/dL — ABNORMAL LOW (ref 8.9–10.3)
Chloride: 90 mmol/L — ABNORMAL LOW (ref 98–111)
Chloride: 93 mmol/L — ABNORMAL LOW (ref 98–111)
Creatinine, Ser: 1.29 mg/dL — ABNORMAL HIGH (ref 0.61–1.24)
Creatinine, Ser: 1.33 mg/dL — ABNORMAL HIGH (ref 0.61–1.24)
GFR, Estimated: >60 mL/min (ref >60)
GFR, Estimated: >60 mL/min (ref >60)
Glucose, Bld: 105 mg/dL — ABNORMAL HIGH (ref 70–99)
Glucose, Bld: 122 mg/dL — ABNORMAL HIGH (ref 70–99)
Potassium: 2.6 mmol/L — CL (ref 3.5–5.1)
Potassium: 2.9 mmol/L — ABNORMAL LOW (ref 3.5–5.1)
Sodium: 136 mmol/L (ref 135–145)
Sodium: 134 mmol/L — ABNORMAL LOW (ref 135–145)

## 2021-04-22 MED ORDER — POTASSIUM CHLORIDE CRYS ER 20 MEQ PO TBCR
40.0000 meq | EXTENDED_RELEASE_TABLET | Freq: Three times a day (TID) | ORAL | Status: DC
  Administered 2021-04-22 – 2021-04-23 (×3): 40 meq via ORAL
  Filled 2021-04-22 (×3): qty 2

## 2021-04-22 MED ORDER — POTASSIUM CHLORIDE 10 MEQ/100ML IV SOLN
10.0000 meq | INTRAVENOUS | Status: AC
  Administered 2021-04-22 (×3): 10 meq via INTRAVENOUS
  Filled 2021-04-22 (×4): qty 100

## 2021-04-22 MED ORDER — POTASSIUM CHLORIDE 10 MEQ/100ML IV SOLN
10.0000 meq | INTRAVENOUS | Status: AC
  Administered 2021-04-22 (×2): 10 meq via INTRAVENOUS
  Filled 2021-04-22 (×2): qty 100

## 2021-04-22 MED ORDER — POTASSIUM CHLORIDE 10 MEQ/100ML IV SOLN
10.0000 meq | INTRAVENOUS | Status: AC
  Administered 2021-04-22 – 2021-04-23 (×4): 10 meq via INTRAVENOUS
  Filled 2021-04-22 (×3): qty 100

## 2021-04-22 NOTE — Progress Notes (Signed)
Patient ID: Daniel Crawford, male   DOB: 07-01-1955, 66 y.o.   MRN: [id number]                                                                               HPI:    Daniel Crawford  is a 66 y.o. male, with history of CKD, depression, hypothyroidism, liver cirrhosis secondary to [facility], thrombocytopenia, leukopenia, and more presents the ED with a chief complaint of abdominal pain.  Patient reports lower right-sided abdominal pain radiating to the right upper quadrant and around to the back at the level of the right upper quadrant.  Patient reports this has been going on for a long time.  This episode has been going on for about a week.  It is sharp/stabbing pain.  Does not associated with mealtime.  It is not associated with bowel movements.  He has no nausea or vomiting.  No fevers.  His last normal meal was 2 PM he reports his appetite is intact.  Patient reports that this pain never really goes away, just becomes tolerable between flares.  He reports he has never been given a reason that he has this pain.  He suspects it [place] be related to his liver cirrhosis.  Patient reports hypokalemia is something he has been battling for a while as well.  He takes potassium supplementation 3 times a day at home.  He also takes between 40 and 80 mg of Lasix at home.  He reports has been compliant with both these medications.  On review of system patient admits to chronic exertional shortness of breath.  He denies any palpitations, chest pain, dysuria, hematuria, diarrhea, other pains.  Patient does not smoke, does not drink alcohol, does not use illicit drugs.  He is vaccinated for COVID.  Patient is full code.  In the ED  Temp 98.8, heart rate 73-79, respiratory rate 12-20, blood pressure 130/68, satting at 91% Patient is leukopenic at 2.7-chronically low, hemoglobin 17.7, platelets 54 Sodium 133, calcium 2.2, chloride 87, bicarb 37, creatinine at baseline, glucose 135 UA is not indicative of UTI CT abdomen pelvis shows no acute  appendicitis.  Liver cirrhosis with portal hypertension.  Small gastroesophageal varices. EKG shows a heart rate 76, QTc 643 sinus rhythm Lipase 48 Patient was given 70 mEq total of potassium in the ED, morphine, Zofran Admission requested for further work-up and management of severe hypokalemia  Subjective Feels good   Physical Exam:   Vitals  Blood pressure 113/64, pulse 63, temperature 98.3 F (36.8 C), temperature source Oral, resp. rate 16, height 6\' 1"  (1.854 m), weight (!) 161.9 kg, SpO2 97 %.   1.  General: Patient lying supine in bed,  no acute distress   2. Psychiatric: Alert and oriented x 3, mood and behavior normal for situation, pleasant and cooperative with exam   3. Neurologic: Speech and language are normal, face is symmetric, moves all 4 extremities voluntarily, at baseline without acute deficits on limited exam   4. HEENMT:  Head is atraumatic, normocephalic, pupils reactive to light, neck is supple, trachea is midline, mucous membranes are moist   5. Respiratory : Lungs are clear to auscultation bilaterally without wheezing, rhonchi, rales,  no cyanosis, no increase in work of breathing or accessory muscle use   6. Cardiovascular : Heart rate normal, rhythm is regular, no murmurs, rubs or gallops, no peripheral edema, peripheral pulses palpated   7. Gastrointestinal:  Abdomen is soft, nondistended, tender to the right upper quadrant most exquisitely, less tender to the right lower quadrant, also tender in the epigastric region, bowel sounds active,    8. Skin:  Skin is warm, dry and intact without rashes, acute lesions, or ulcers on limited exam   9.Musculoskeletal:  No acute deformities or trauma, no asymmetry in tone, no peripheral edema, peripheral pulses palpated, no tenderness to palpation in the extremities     Data Review:    CBC Recent Labs  Lab 04/18/21 1601 04/19/21 0350 04/20/21 0536 04/21/21 0509 04/22/21 0456  WBC 2.7* 2.5* 2.3*  2.4* 2.5*  HGB 11.7* 11.5* 11.0* 11.2* 11.5*  HCT 35.4* 33.9* 32.4* 34.7* 34.9*  PLT 54* 56* 56* 66* 74*  MCV 86.3 88.1 88.0 86.5 87.3  MCH 28.5 29.9 29.9 27.9 28.8  MCHC 33.1 33.9 34.0 32.3 33.0  RDW 16.4* 16.7* 16.2* 16.0* 16.3*  LYMPHSABS 0.8 0.7 0.8 0.8 0.8  MONOABS 0.3 0.3 0.3 0.3 0.3  EOSABS 0.2 0.2 0.2 0.2 0.2  BASOSABS 0.0 0.0 0.0 0.0 0.0   ------------------------------------------------------------------------------------------------------------------  Results for orders placed or performed during the hospital encounter of 04/18/21 (from the past 48 hour(s))  Basic metabolic panel     Status: Abnormal   Collection Time: 04/20/21  5:05 PM  Result Value Ref Range   Sodium 134 (L) 135 - 145 mmol/L   Potassium 2.3 (LL) 3.5 - 5.1 mmol/L    Comment: CRITICAL RESULT CALLED TO, READ BACK BY AND VERIFIED WITH: TO MILLS, M. AT 1803 ON 04/20/21 BY BROWNING, D.    Chloride 86 (L) 98 - 111 mmol/L   CO2 39 (H) 22 - 32 mmol/L   Glucose, Bld 139 (H) 70 - 99 mg/dL    Comment: Glucose reference range applies only to samples taken after fasting for at least 8 hours.   BUN 13 8 - 23 mg/dL   Creatinine, Ser 1.36 (H) 0.61 - 1.24 mg/dL   Calcium 8.1 (L) 8.9 - 10.3 mg/dL   GFR, Estimated 58 (L) >60 mL/min    Comment: (NOTE) Calculated using the CKD-EPI Creatinine Equation (2021)    Anion gap 9 5 - 15    Comment: Performed at [facility], [address], [place], [place] [address]  Basic metabolic panel     Status: Abnormal   Collection Time: 04/20/21  7:05 PM  Result Value Ref Range   Sodium 131 (L) 135 - 145 mmol/L   Potassium 2.0 (LL) 3.5 - 5.1 mmol/L    Comment: CRITICAL RESULT CALLED TO, READ BACK BY AND VERIFIED WITH: MUSE,T AT 1944 ON 1.26.23 BY RUCINSKI,B    Chloride 85 (L) 98 - 111 mmol/L   CO2 44 (H) 22 - 32 mmol/L   Glucose, Bld 165 (H) 70 - 99 mg/dL    Comment: Glucose reference range applies only to samples taken after fasting for at least 8 hours.   BUN 14 8 - 23 mg/dL    Creatinine, Ser 1.50 (H) 0.61 - 1.24 mg/dL   Calcium 7.8 (L) 8.9 - 10.3 mg/dL   GFR, Estimated 52 (L) >60 mL/min    Comment: (NOTE) Calculated using the CKD-EPI Creatinine Equation (2021)    Anion gap 2 (L) 5 - 15    Comment:  RERAN Performed at [facility], [address] [place], [place] [address]   CBC with Differential/Platelet     Status: Abnormal   Collection Time: 04/21/21  5:09 AM  Result Value Ref Range   WBC 2.4 (L) 4.0 - 10.5 K/uL   RBC 4.01 (L) 4.22 - 5.81 MIL/uL   Hemoglobin 11.2 (L) 13.0 - 17.0 g/dL   HCT 34.7 (L) 39.0 - 52.0 %   MCV 86.5 80.0 - 100.0 fL   MCH 27.9 26.0 - 34.0 pg   MCHC 32.3 30.0 - 36.0 g/dL   RDW 16.0 (H) 11.5 - 15.5 %   Platelets 66 (L) 150 - 400 K/uL    Comment: SPECIMEN CHECKED FOR CLOTS Immature Platelet Fraction [place] be clinically indicated, consider ordering this additional test [id number] CONSISTENT WITH PREVIOUS RESULT REPEATED TO VERIFY    nRBC 0.0 0.0 - 0.2 %   Neutrophils Relative % 47 %   Neutro Abs 1.1 (L) 1.7 - 7.7 K/uL   Lymphocytes Relative 31 %   Lymphs Abs 0.8 0.7 - 4.0 K/uL   Monocytes Relative 14 %   Monocytes Absolute 0.3 0.1 - 1.0 K/uL   Eosinophils Relative 7 %   Eosinophils Absolute 0.2 0.0 - 0.5 K/uL   Basophils Relative 1 %   Basophils Absolute 0.0 0.0 - 0.1 K/uL   WBC Morphology MORPHOLOGY UNREMARKABLE    RBC Morphology MORPHOLOGY UNREMARKABLE    Smear Review MORPHOLOGY UNREMARKABLE    Immature Granulocytes 0 %   Abs Immature Granulocytes 0.01 0.00 - 0.07 K/uL    Comment: Performed at [facility], [address] [place], [place] [address]  Basic metabolic panel     Status: Abnormal   Collection Time: 04/21/21  5:09 AM  Result Value Ref Range   Sodium 137 135 - 145 mmol/L   Potassium 2.3 (LL) 3.5 - 5.1 mmol/L    Comment: CRITICAL RESULT CALLED TO, READ BACK BY AND VERIFIED WITH: TO MUSE, J. AT 7867 ON 04/21/21 BY BROWNING, D.    Chloride 85 (L) 98 - 111 mmol/L   CO2 40 (H) 22 - 32 mmol/L   Glucose, Bld  105 (H) 70 - 99 mg/dL    Comment: Glucose reference range applies only to samples taken after fasting for at least 8 hours.   BUN 15 8 - 23 mg/dL   Creatinine, Ser 1.27 (H) 0.61 - 1.24 mg/dL   Calcium 8.5 (L) 8.9 - 10.3 mg/dL   GFR, Estimated >60 >60 mL/min    Comment: (NOTE) Calculated using the CKD-EPI Creatinine Equation (2021)    Anion gap 12 5 - 15    Comment: Performed at [facility], [address], [place], [place] [address]  Urinalysis, Complete w Microscopic Urine, Clean Catch     Status: Abnormal   Collection Time: 04/21/21  8:41 AM  Result Value Ref Range   Color, Urine AMBER (A) YELLOW    Comment: BIOCHEMICALS Down BE AFFECTED BY COLOR   APPearance CLEAR CLEAR   Specific Gravity, Urine 1.015 1.005 - 1.030   pH 6.5 5.0 - 8.0   Glucose, UA NEGATIVE NEGATIVE mg/dL   Hgb urine dipstick NEGATIVE NEGATIVE   Bilirubin Urine NEGATIVE NEGATIVE   Ketones, ur NEGATIVE NEGATIVE mg/dL   Protein, ur NEGATIVE NEGATIVE mg/dL   Nitrite NEGATIVE NEGATIVE   Leukocytes,Ua NEGATIVE NEGATIVE   Squamous Epithelial / LPF 0-5 0 - 5   WBC, UA 0-5 0 - 5 WBC/hpf   RBC / HPF 0-5 0 - 5  RBC/hpf   Bacteria, UA NONE SEEN NONE SEEN    Comment: Performed at [facility], [address] [place], [place] [address]  Na and K (sodium & potassium), rand urine     Status: None   Collection Time: 04/21/21  8:41 AM  Result Value Ref Range   Sodium, Ur 31 mmol/L   Potassium Urine 57 mmol/L    Comment: Performed at [facility], [address] [place], [place] [address]  Creatinine, urine, random     Status: None   Collection Time: 04/21/21  8:41 AM  Result Value Ref Range   Creatinine, Urine 120.68 mg/dL    Comment: Performed at [facility] [address], [place], [place] [address]  TSH     Status: Abnormal   Collection Time: 04/21/21 10:02 AM  Result Value Ref Range   TSH 4.704 (H) 0.350 - 4.500 uIU/mL    Comment: Performed by a 3rd Generation assay with a functional sensitivity of <=0.01  uIU/mL. Performed at [facility], [address], [place], [place] [address]   CBC with Differential/Platelet     Status: Abnormal   Collection Time: 04/22/21  4:56 AM  Result Value Ref Range   WBC 2.5 (L) 4.0 - 10.5 K/uL   RBC 4.00 (L) 4.22 - 5.81 MIL/uL   Hemoglobin 11.5 (L) 13.0 - 17.0 g/dL   HCT 34.9 (L) 39.0 - 52.0 %   MCV 87.3 80.0 - 100.0 fL   MCH 28.8 26.0 - 34.0 pg   MCHC 33.0 30.0 - 36.0 g/dL   RDW 16.3 (H) 11.5 - 15.5 %   Platelets 74 (L) 150 - 400 K/uL    Comment: SPECIMEN CHECKED FOR CLOTS Immature Platelet Fraction [place] be clinically indicated, consider ordering this additional test [id number] CONSISTENT WITH PREVIOUS RESULT    nRBC 0.0 0.0 - 0.2 %   Neutrophils Relative % 45 %   Neutro Abs 1.1 (L) 1.7 - 7.7 K/uL   Lymphocytes Relative 34 %   Lymphs Abs 0.8 0.7 - 4.0 K/uL   Monocytes Relative 13 %   Monocytes Absolute 0.3 0.1 - 1.0 K/uL   Eosinophils Relative 6 %   Eosinophils Absolute 0.2 0.0 - 0.5 K/uL   Basophils Relative 1 %   Basophils Absolute 0.0 0.0 - 0.1 K/uL   Immature Granulocytes 1 %   Abs Immature Granulocytes 0.02 0.00 - 0.07 K/uL    Comment: Performed at [facility], [address], [place], [place] [address]  Basic metabolic panel     Status: Abnormal   Collection Time: 04/22/21  4:56 AM  Result Value Ref Range   Sodium 136 135 - 145 mmol/L   Potassium 2.6 (LL) 3.5 - 5.1 mmol/L    Comment: CRITICAL RESULT CALLED TO, READ BACK BY AND VERIFIED WITH: HUMPHRIES J @ 0735 ON [id number] BY HENDERSON L    Chloride 90 (L) 98 - 111 mmol/L   CO2 37 (H) 22 - 32 mmol/L   Glucose, Bld 105 (H) 70 - 99 mg/dL    Comment: Glucose reference range applies only to samples taken after fasting for at least 8 hours.   BUN 14 8 - 23 mg/dL   Creatinine, Ser 1.29 (H) 0.61 - 1.24 mg/dL   Calcium 8.6 (L) 8.9 - 10.3 mg/dL   GFR, Estimated >60 >60 mL/min    Comment: (NOTE) Calculated using the CKD-EPI Creatinine Equation (2021)    Anion gap 9 5 - 15    Comment:  Performed at [facility], [address] [place], [place] [address]    Chemistries  Recent Labs  Lab 04/18/21 1601 04/18/21 1615 04/19/21 0350 04/19/21 1038 04/20/21 0536 04/20/21 1705 04/20/21 1905 04/21/21 0509 04/22/21 0456  NA 133*  --  134*   < > 136 134* 131* 137 136  K 2.2*  --  2.1*   < > 2.1* 2.3* 2.0* 2.3* 2.6*  CL 87*  --  89*   < > 85* 86* 85* 85* 90*  CO2 37*  --  39*   < > 42* 39* 44* 40* 37*  GLUCOSE 135*  --  131*   < > 110* 139* 165* 105* 105*  BUN 14  --  13   < > 14 13 14 15 14   CREATININE 1.19  --  1.34*   < > 1.32* 1.36* 1.50* 1.27* 1.29*  CALCIUM 8.2*  --  7.9*   < > 8.3* 8.1* 7.8* 8.5* 8.6*  MG  --  1.8 1.8  --  1.9  --   --   --   --   AST 48*  --  48*  --   --   --   --   --   --   ALT 19  --  20  --   --   --   --   --   --   ALKPHOS 76  --  67  --   --   --   --   --   --   BILITOT 2.0*  --  2.3*  --   --   --   --   --   --    < > = values in this interval not displayed.   ------------------------------------------------------------------------------------------------------------------  ------------------------------------------------------------------------------------------------------------------ GFR: Estimated Creatinine Clearance: 93.4 mL/min (A) (by C-G formula based on SCr of 1.29 mg/dL (H)). Liver Function Tests: Recent Labs  Lab 04/18/21 1601 04/19/21 0350  AST 48* 48*  ALT 19 20  ALKPHOS 76 67  BILITOT 2.0* 2.3*  PROT 6.7 6.7  ALBUMIN 3.0* 3.0*   Recent Labs  Lab 04/18/21 1601  LIPASE 48   No results for input(s): AMMONIA in the last 168 hours. Coagulation Profile: Recent Labs  Lab 04/19/21 0350  INR 1.3*   Cardiac Enzymes: No results for input(s): CKTOTAL, CKMB, CKMBINDEX, TROPONINI in the last 168 hours. BNP (last 3 results) No results for input(s): PROBNP in the last 8760 hours. HbA1C: No results for input(s): HGBA1C in the last 72 hours. CBG: No results for input(s): GLUCAP in the last 168 hours. Lipid  Profile: No results for input(s): CHOL, HDL, LDLCALC, TRIG, CHOLHDL, LDLDIRECT in the last 72 hours. Thyroid Function Tests: Recent Labs    04/21/21 1002  TSH 4.704*   Anemia Panel: No results for input(s): VITAMINB12, FOLATE, FERRITIN, TIBC, IRON, RETICCTPCT in the last 72 hours.  --------------------------------------------------------------------------------------------------------------- Urine analysis:    Component Value Date/Time   COLORURINE AMBER (A) 04/21/2021 0841   APPEARANCEUR CLEAR 04/21/2021 0841   LABSPEC 1.015 04/21/2021 0841   PHURINE 6.5 04/21/2021 Daniel 04/21/2021 0841   HGBUR NEGATIVE 04/21/2021 0841   BILIRUBINUR NEGATIVE 04/21/2021 0841   KETONESUR NEGATIVE 04/21/2021 0841   PROTEINUR NEGATIVE 04/21/2021 0841   NITRITE NEGATIVE 04/21/2021 0841   LEUKOCYTESUR NEGATIVE 04/21/2021 0841      Imaging Results:    No results found.     Assessment & Plan:    Principal Problem:   Hypokalemia Active Problems:   Hypothyroidism  Thrombocytopenia (HCC)   Leukopenia   Prolonged QT interval   Hypokalemia Likely secondary to renal and GI losses secondary to Lasix and rifaximin Replace and recheck Monitor on telemetry Check magnesium and replete to keep above 1.8 Placed on p.o. and IV repletion until maintained above 2.5 Lasix stopped and started on Aldactone  nephrology following Prolonged QT Likely related to hypokalemia Monitor on telemetry Likely to improve with K+ repletion Thyroid disease Continue synthroid GERD Continue protonix NASH Continue Rifaximin    Kellina Dreese A MD  Patient ID: Daniel Crawford, male   DOB: 06-Oct-1955, 66 y.o.   MRN: [id number]

## 2021-04-22 NOTE — Progress Notes (Signed)
I have looked at labs-  K up from 2.3 to 2.6 only with significant repletion-  will repeat K runs today and increase oral K to 120 meq today and check labs tomorrow-  also on aldactone-  currently loop diuretics on hold  [place]

## 2021-04-23 DIAGNOSIS — E876 Hypokalemia: Secondary | ICD-10-CM | POA: Diagnosis not present

## 2021-04-23 LAB — BASIC METABOLIC PANEL
Anion gap: 7 (ref 5–15)
BUN: 13 mg/dL (ref 8–23)
CO2: 32 mmol/L (ref 22–32)
Calcium: 8.6 mg/dL — ABNORMAL LOW (ref 8.9–10.3)
Chloride: 98 mmol/L (ref 98–111)
Creatinine, Ser: 1.04 mg/dL (ref 0.61–1.24)
GFR, Estimated: >60 mL/min (ref >60)
Glucose, Bld: 108 mg/dL — ABNORMAL HIGH (ref 70–99)
Potassium: 3.4 mmol/L — ABNORMAL LOW (ref 3.5–5.1)
Sodium: 137 mmol/L (ref 135–145)

## 2021-04-23 LAB — CBC WITH DIFFERENTIAL/PLATELET
Abs Immature Granulocytes: 0.01 10*3/uL (ref 0.00–0.07)
Basophils Absolute: 0 10*3/uL (ref 0.0–0.1)
Basophils Relative: 1 %
Eosinophils Absolute: 0.1 10*3/uL (ref 0.0–0.5)
Eosinophils Relative: 6 %
HCT: 33 % — ABNORMAL LOW (ref 39.0–52.0)
Hemoglobin: 10.8 g/dL — ABNORMAL LOW (ref 13.0–17.0)
Immature Granulocytes: 0 %
Lymphocytes Relative: 35 %
Lymphs Abs: 0.8 10*3/uL (ref 0.7–4.0)
MCH: 29 pg (ref 26.0–34.0)
MCHC: 32.7 g/dL (ref 30.0–36.0)
MCV: 88.5 fL (ref 80.0–100.0)
Monocytes Absolute: 0.4 10*3/uL (ref 0.1–1.0)
Monocytes Relative: 15 %
Neutro Abs: 1 10*3/uL — ABNORMAL LOW (ref 1.7–7.7)
Neutrophils Relative %: 43 %
Platelets: 78 10*3/uL — ABNORMAL LOW (ref 150–400)
RBC: 3.73 MIL/uL — ABNORMAL LOW (ref 4.22–5.81)
RDW: 16.4 % — ABNORMAL HIGH (ref 11.5–15.5)
WBC: 2.4 10*3/uL — ABNORMAL LOW (ref 4.0–10.5)
nRBC: 0 % (ref 0.0–0.2)

## 2021-04-23 MED ORDER — POTASSIUM CHLORIDE 10 MEQ/100ML IV SOLN
INTRAVENOUS | Status: AC
  Filled 2021-04-23: qty 100

## 2021-04-23 MED ORDER — SPIRONOLACTONE 50 MG PO TABS: 50.0000 mg | ORAL_TABLET | Freq: Every day | ORAL | 0 refills | Status: DC

## 2021-04-23 MED ORDER — CARVEDILOL 6.25 MG PO TABS: 6.2500 mg | ORAL_TABLET | Freq: Two times a day (BID) | ORAL | 0 refills | Status: DC

## 2021-04-23 MED ORDER — FUROSEMIDE 40 MG PO TABS: 40.0000 mg | ORAL_TABLET | Freq: Every day | ORAL | 0 refills | Status: DC

## 2021-04-23 MED ORDER — CARVEDILOL 3.125 MG PO TABS: 6.2500 mg | ORAL_TABLET | Freq: Two times a day (BID) | ORAL | Status: DC

## 2021-04-23 MED ORDER — FUROSEMIDE 20 MG PO TABS
20.0000 mg | ORAL_TABLET | Freq: Every day | ORAL | Status: DC
  Administered 2021-04-23: 20 mg via ORAL
  Filled 2021-04-23: qty 1

## 2021-04-23 MED ORDER — POTASSIUM CHLORIDE CRYS ER 20 MEQ PO TBCR: 40.0000 meq | EXTENDED_RELEASE_TABLET | Freq: Three times a day (TID) | ORAL | 0 refills | Status: DC

## 2021-04-23 MED ORDER — POTASSIUM CHLORIDE CRYS ER 20 MEQ PO TBCR: 40.0000 meq | EXTENDED_RELEASE_TABLET | Freq: Two times a day (BID) | ORAL | 0 refills | Status: DC

## 2021-04-23 NOTE — Discharge Summary (Addendum)
Physician Discharge Summary  Press Oneil MRN:[id number] DOB: 1955-08-30 DOA: 04/18/2021  PCP: [name], MD  Admit date: 04/18/2021 Discharge date: 04/23/2021  Admitted From: home Discharge disposition: home   Recommendations for Outpatient Follow-Up:   BMP Tuesday- will need adjustment of lasix/aldactone and amount of Kdur at that time Coreg dose also adjusted due to low normal BP Recheck TSH outpt Renin aldosterone pending   Discharge Diagnosis:   Principal Problem:   Hypokalemia Active Problems:   Hypothyroidism   Thrombocytopenia (HCC)   Leukopenia   Prolonged QT interval    Discharge Condition: Improved.  Diet recommendation: Low sodium, heart healthy.   Wound care: None.  Code status: Full.   History of Present Illness:   Daniel Crawford  is a 66 y.o. male, with history of CKD, depression, hypothyroidism, liver cirrhosis secondary to [facility], thrombocytopenia, leukopenia, and more presents the ED with a chief complaint of abdominal pain.  Patient reports lower right-sided abdominal pain radiating to the right upper quadrant and around to the back at the level of the right upper quadrant.  Patient reports this has been going on for a long time.  This episode has been going on for about a week.  It is sharp/stabbing pain.  Does not associated with mealtime.  It is not associated with bowel movements.  He has no nausea or vomiting.  No fevers.  His last normal meal was 2 PM he reports his appetite is intact.  Patient reports that this pain never really goes away, just becomes tolerable between flares.  He reports he has never been given a reason that he has this pain.  He suspects it Oki be related to his liver cirrhosis.  Patient reports hypokalemia is something he has been battling for a while as well.  He takes potassium supplementation 3 times a day at home.  He also takes between 40 and 80 mg of Lasix at home.  He reports has been compliant with both these  medications.  On review of system patient admits to chronic exertional shortness of breath.  He denies any palpitations, chest pain, dysuria, hematuria, diarrhea, other pains.   Patient does not smoke, does not drink alcohol, does not use illicit drugs.  He is vaccinated for COVID.  Patient is full code.   Hospital Course by Problem:   Hypokalemia -Likely secondary to renal losses secondary to Lasix/metolazone -Replace and recheck -magnesium aok -repleted -nephrology consult appreciated: send out on maybe 40 BID ( was on 60 total daily PTA) -started on aldactone (adjust as able along with the lasix and Kdur)  Prolonged QT -Likely related to hypokalemia -resolved  Thyroid disease -Continue synthroid  GERD -Continue protonix  NASH Continue Rifaximin Lasix/aldactone- will need further adjustment outpateint    Medical Consultants:   nephrology   Discharge Exam:   Vitals:   04/23/21 0537 04/23/21 1313  BP: (!) 111/53 109/85  Pulse: 62 66  Resp: 19 16  Temp: 98 F (36.7 C) 98.1 F (36.7 C)  SpO2: 95% 94%   Vitals:   04/22/21 2105 04/23/21 0537 04/23/21 1305 04/23/21 1313  BP: 138/70 (!) 111/53  109/85  Pulse: 63 62  66  Resp: 19 19  16   Temp: 97.9 F (36.6 C) 98 F (36.7 C)  98.1 F (36.7 C)  TempSrc: Oral   Oral  SpO2: 93% 95%  94%  Weight:   (!) 163.5 kg   Height:        General exam:  Appears calm and comfortable.     The results of significant diagnostics from this hospitalization (including imaging, microbiology, ancillary and laboratory) are listed below for reference.     Procedures and Diagnostic Studies:   CT ABDOMEN PELVIS W CONTRAST  Result Date: 04/18/2021 CLINICAL DATA:  Right lower quadrant pain EXAM: CT ABDOMEN AND PELVIS WITH CONTRAST TECHNIQUE: Multidetector CT imaging of the abdomen and pelvis was performed using the standard protocol following bolus administration of intravenous contrast. RADIATION DOSE REDUCTION: This exam was  performed according to the departmental dose-optimization program which includes automated exposure control, adjustment of the mA and/or kV according to patient size and/or use of iterative reconstruction technique. CONTRAST:  162mL OMNIPAQUE IOHEXOL 300 MG/ML  SOLN COMPARISON:  Ultrasound 03/22/2021, CT 03/23/2021 FINDINGS: Lower chest: No acute consolidation or pleural effusion. Mild cardiomegaly. Small gastroesophageal varices. Hepatobiliary: Liver cirrhosis. Status post cholecystectomy. No biliary dilatation Pancreas: Unremarkable. No pancreatic ductal dilatation or surrounding inflammatory changes. Spleen: Enlarged measuring 22 cm craniocaudad. Adrenals/Urinary Tract: Adrenal glands are unremarkable. Kidneys are normal, without focal lesion, or hydronephrosis. Small nonobstructing stone lower pole right kidney. Bladder is unremarkable. Stomach/Bowel: Stomach is within normal limits. Appendix appears normal. No evidence of bowel wall thickening, distention, or inflammatory changes. Vascular/Lymphatic: Nonaneurysmal aorta. Nonspecific retroperitoneal lymph nodes. Recanalized paraumbilical vein. Reproductive: Prostate is unremarkable. Other: No free air. Small amount of abdominopelvic ascites. Hazy edema and stranding within the mesentery and colic gutters. Large periumbilical hernia containing mesenteric fat. Small amount of fluid in the hernia sac. Musculoskeletal: No acute osseous abnormality. IMPRESSION: 1. Negative for acute appendicitis. 2. Liver cirrhosis with portal hypertension. Splenomegaly and small amount of abdominopelvic ascites. Small gastroesophageal varices. 3. Large periumbilical hernia containing mesenteric fat and small amount of fluid 4. Nonobstructing right kidney stone Electronically Signed   By: Donavan Foil M.D.   On: 04/18/2021 19:18     Labs:   Basic Metabolic Panel: Recent Labs  Lab 04/18/21 1615 04/19/21 0350 04/19/21 1038 04/20/21 0536 04/20/21 1705 04/20/21 1905  04/21/21 0509 04/22/21 0456 04/22/21 1832 04/23/21 0450  NA  --  134*   < > 136   < > 131* 137 136 134* 137  K  --  2.1*   < > 2.1*   < > 2.0* 2.3* 2.6* 2.9* 3.4*  CL  --  89*   < > 85*   < > 85* 85* 90* 93* 98  CO2  --  39*   < > 42*   < > 44* 40* 37* 35* 32  GLUCOSE  --  131*   < > 110*   < > 165* 105* 105* 122* 108*  BUN  --  13   < > 14   < > 14 15 14 13 13   CREATININE  --  1.34*   < > 1.32*   < > 1.50* 1.27* 1.29* 1.33* 1.04  CALCIUM  --  7.9*   < > 8.3*   < > 7.8* 8.5* 8.6* 8.2* 8.6*  MG 1.8 1.8  --  1.9  --   --   --   --   --   --    < > = values in this interval not displayed.   GFR Estimated Creatinine Clearance: 116.5 mL/min (by C-G formula based on SCr of 1.04 mg/dL). Liver Function Tests: Recent Labs  Lab 04/18/21 1601 04/19/21 0350  AST 48* 48*  ALT 19 20  ALKPHOS 76 67  BILITOT 2.0* 2.3*  PROT 6.7 6.7  ALBUMIN  3.0* 3.0*   Recent Labs  Lab 04/18/21 1601  LIPASE 48   No results for input(s): AMMONIA in the last 168 hours. Coagulation profile Recent Labs  Lab 04/19/21 0350  INR 1.3*    CBC: Recent Labs  Lab 04/19/21 0350 04/20/21 0536 04/21/21 0509 04/22/21 0456 04/23/21 0450  WBC 2.5* 2.3* 2.4* 2.5* 2.4*  NEUTROABS 1.3* 1.1* 1.1* 1.1* 1.0*  HGB 11.5* 11.0* 11.2* 11.5* 10.8*  HCT 33.9* 32.4* 34.7* 34.9* 33.0*  MCV 88.1 88.0 86.5 87.3 88.5  PLT 56* 56* 66* 74* 78*   Cardiac Enzymes: No results for input(s): CKTOTAL, CKMB, CKMBINDEX, TROPONINI in the last 168 hours. BNP: Invalid input(s): POCBNP CBG: No results for input(s): GLUCAP in the last 168 hours. D-Dimer No results for input(s): DDIMER in the last 72 hours. Hgb A1c No results for input(s): HGBA1C in the last 72 hours. Lipid Profile No results for input(s): CHOL, HDL, LDLCALC, TRIG, CHOLHDL, LDLDIRECT in the last 72 hours. Thyroid function studies Recent Labs    04/21/21 1002  TSH 4.704*   Anemia work up No results for input(s): VITAMINB12, FOLATE, FERRITIN, TIBC, IRON,  RETICCTPCT in the last 72 hours. Microbiology Recent Results (from the past 240 hour(s))  Resp Panel by RT-PCR (Flu A&B, Covid) Nasopharyngeal Swab     Status: None   Collection Time: 04/18/21  8:10 PM   Specimen: Nasopharyngeal Swab; Nasopharyngeal(NP) swabs in vial transport medium  Result Value Ref Range Status   SARS Coronavirus 2 by RT PCR NEGATIVE NEGATIVE Final    Comment: (NOTE) SARS-CoV-2 target nucleic acids are NOT DETECTED.  The SARS-CoV-2 RNA is generally detectable in upper respiratory specimens during the acute phase of infection. The lowest concentration of SARS-CoV-2 viral copies this assay can detect is 138 copies/mL. A negative result does not preclude SARS-Cov-2 infection and should not be used as the sole basis for treatment or other patient management decisions. A negative result Swaminathan occur with  improper specimen collection/handling, submission of specimen other than nasopharyngeal swab, presence of viral mutation(s) within the areas targeted by this assay, and inadequate number of viral copies(<138 copies/mL). A negative result must be combined with clinical observations, patient history, and epidemiological information. The expected result is Negative.  Fact Sheet for Patients:  EntrepreneurPulse.com.au  Fact Sheet for Healthcare Providers:  IncredibleEmployment.be  This test is no t yet approved or cleared by the [place] FDA and  has been authorized for detection and/or diagnosis of SARS-CoV-2 by FDA under an Emergency Use Authorization (EUA). This EUA will remain  in effect (meaning this test can be used) for the duration of the COVID-19 declaration under Section 564(b)(1) of the Act, 21 U.S.C.section 360bbb-3(b)(1), unless the authorization is terminated  or revoked sooner.       Influenza A by PCR NEGATIVE NEGATIVE Final   Influenza B by PCR NEGATIVE NEGATIVE Final    Comment: (NOTE) The Xpert Xpress  SARS-CoV-2/FLU/RSV plus assay is intended as an aid in the diagnosis of influenza from Nasopharyngeal swab specimens and should not be used as a sole basis for treatment. Nasal washings and aspirates are unacceptable for Xpert Xpress SARS-CoV-2/FLU/RSV testing.  Fact Sheet for Patients: EntrepreneurPulse.com.au  Fact Sheet for Healthcare Providers: IncredibleEmployment.be  This test is not yet approved or cleared by the [place] FDA and has been authorized for detection and/or diagnosis of SARS-CoV-2 by FDA under an Emergency Use Authorization (EUA). This EUA will remain in effect (meaning this test can be used) for the duration  of the COVID-19 declaration under Section 564(b)(1) of the Act, 21 U.S.C. section 360bbb-3(b)(1), unless the authorization is terminated or revoked.  Performed at [facility], [address], [place], [place] [address]      Discharge Instructions:   Discharge Instructions     Diet - low sodium heart healthy   Complete by: As directed    Discharge instructions   Complete by: As directed    BMP on Tuesday   Increase activity slowly   Complete by: As directed       Allergies as of 04/23/2021   No Known Allergies      Medication List     STOP taking these medications    lactulose 10 GM/15ML solution Commonly known as: CHRONULAC   metolazone 5 MG tablet Commonly known as: ZAROXOLYN   Potassium Chloride ER 20 MEQ Tbcr       TAKE these medications    Acetaminophen 500 MG capsule Take 1,000 mg by mouth every 6 (six) hours as needed for pain.   albuterol 108 (90 Base) MCG/ACT inhaler Commonly known as: VENTOLIN HFA TAKE 2 PUFFS BY MOUTH EVERY 6 HOURS AS NEEDED FOR WHEEZE OR SHORTNESS OF BREATH What changed: See the new instructions.   ascorbic acid 500 MG tablet Commonly known as: VITAMIN C Take by mouth.   carvedilol 6.25 MG tablet Commonly known as: COREG Take 1 tablet (6.25 mg total)  by mouth 2 (two) times daily with a meal. What changed:  medication strength how much to take   Cholecalciferol 25 MCG (1000 UT) tablet Take by mouth.   furosemide 40 MG tablet Commonly known as: LASIX Take 1 tablet (40 mg total) by mouth daily. What changed:  medication strength how much to take   gabapentin 100 MG capsule Commonly known as: NEURONTIN Take 100 mg by mouth at bedtime.   hydrOXYzine 50 MG capsule Commonly known as: VISTARIL Take 50 mg by mouth in the morning and at bedtime.   levothyroxine 175 MCG tablet Commonly known as: SYNTHROID Take 175 mcg by mouth daily before breakfast.   pantoprazole 40 MG tablet Commonly known as: PROTONIX Take 40 mg by mouth daily.   potassium chloride SA 20 MEQ tablet Commonly known as: KLOR-CON M Take 2 tablets (40 mEq total) by mouth 2 (two) times daily.   rifaximin 550 MG Tabs tablet Commonly known as: XIFAXAN Take 550 mg by mouth 2 (two) times daily.   sertraline 25 MG tablet Commonly known as: ZOLOFT Take 25 mg by mouth at bedtime.   spironolactone 50 MG tablet Commonly known as: ALDACTONE Take 1 tablet (50 mg total) by mouth daily. Start taking on: April 24, 2021   traZODone 50 MG tablet Commonly known as: DESYREL Take 50 mg by mouth at bedtime.   zinc gluconate 50 MG tablet Take 1 tablet by mouth daily.        Follow-up Information     [name], MD Follow up.   Specialties: Family Medicine, Sports Medicine Why: BMP on tuesday at appointment Contact information: [address] [place] [address] [phone]         Satira Sark, MD .   Specialty: Cardiology Contact information: [place] [address] [phone]                  Time coordinating discharge: 35 min  Signed:  Geradine Girt DO  [facility] 04/23/2021, 2:03 PM

## 2021-04-23 NOTE — Plan of Care (Signed)

## 2021-04-23 NOTE — Progress Notes (Signed)
Looked at labs-  potassium much better at 3.4.  I have discussed with Dr. Eliseo Squires-  her plan is to place him on some lasix in addition to the aldactone-  continue potassium oral supp at 40 TID today -  possibly home tomorrow  I would send out on maybe 40 BID ( was on 60 total daily PTA)   Then he will need frequent blood draws as OP to assess his situation   Louis Meckel

## 2021-04-24 LAB — ALDOSTERONE + RENIN ACTIVITY W/ RATIO
ALDO / PRA Ratio: 2.1 (ref 0.0–30.0)
Aldosterone: 21.7 ng/dL (ref 0.0–30.0)
PRA LC/MS/MS: 10.503 ng/mL/hr — ABNORMAL HIGH (ref 0.167–5.380)

## 2021-04-27 DIAGNOSIS — E039 Hypothyroidism, unspecified: Secondary | ICD-10-CM | POA: Diagnosis not present

## 2021-04-27 DIAGNOSIS — R188 Other ascites: Secondary | ICD-10-CM | POA: Diagnosis not present

## 2021-04-27 DIAGNOSIS — Z6841 Body Mass Index (BMI) 40.0 and over, adult: Secondary | ICD-10-CM | POA: Diagnosis not present

## 2021-04-27 DIAGNOSIS — Z7182 Exercise counseling: Secondary | ICD-10-CM | POA: Diagnosis not present

## 2021-04-27 DIAGNOSIS — Z713 Dietary counseling and surveillance: Secondary | ICD-10-CM | POA: Diagnosis not present

## 2021-04-27 DIAGNOSIS — K7460 Unspecified cirrhosis of liver: Secondary | ICD-10-CM | POA: Diagnosis not present

## 2021-04-27 DIAGNOSIS — R0600 Dyspnea, unspecified: Secondary | ICD-10-CM | POA: Diagnosis not present

## 2021-04-27 DIAGNOSIS — E876 Hypokalemia: Secondary | ICD-10-CM | POA: Diagnosis not present

## 2021-04-27 DIAGNOSIS — R69 Illness, unspecified: Secondary | ICD-10-CM | POA: Diagnosis not present

## 2021-04-27 DIAGNOSIS — N182 Chronic kidney disease, stage 2 (mild): Secondary | ICD-10-CM | POA: Diagnosis not present

## 2021-04-27 DIAGNOSIS — R609 Edema, unspecified: Secondary | ICD-10-CM | POA: Diagnosis not present

## 2021-04-28 LAB — ALDOSTERONE + RENIN ACTIVITY W/ RATIO
ALDO / PRA Ratio: 2.8 (ref 0.0–30.0)
Aldosterone: 21.2 ng/dL (ref 0.0–30.0)
PRA LC/MS/MS: 7.572 ng/mL/hr — ABNORMAL HIGH (ref 0.167–5.380)

## 2021-05-04 DIAGNOSIS — K7290 Hepatic failure, unspecified without coma: Secondary | ICD-10-CM | POA: Diagnosis not present

## 2021-05-04 DIAGNOSIS — Z79899 Other long term (current) drug therapy: Secondary | ICD-10-CM | POA: Diagnosis not present

## 2021-05-04 DIAGNOSIS — E039 Hypothyroidism, unspecified: Secondary | ICD-10-CM | POA: Diagnosis not present

## 2021-05-11 DIAGNOSIS — Z79899 Other long term (current) drug therapy: Secondary | ICD-10-CM | POA: Diagnosis not present

## 2021-05-11 DIAGNOSIS — K7290 Hepatic failure, unspecified without coma: Secondary | ICD-10-CM | POA: Diagnosis not present

## 2021-05-16 DIAGNOSIS — H01009 Unspecified blepharitis unspecified eye, unspecified eyelid: Secondary | ICD-10-CM | POA: Diagnosis not present

## 2021-05-16 DIAGNOSIS — H5203 Hypermetropia, bilateral: Secondary | ICD-10-CM | POA: Diagnosis not present

## 2021-05-16 DIAGNOSIS — H25813 Combined forms of age-related cataract, bilateral: Secondary | ICD-10-CM | POA: Diagnosis not present

## 2021-05-16 DIAGNOSIS — H52223 Regular astigmatism, bilateral: Secondary | ICD-10-CM | POA: Diagnosis not present

## 2021-05-16 DIAGNOSIS — H2513 Age-related nuclear cataract, bilateral: Secondary | ICD-10-CM | POA: Diagnosis not present

## 2021-05-16 DIAGNOSIS — H25013 Cortical age-related cataract, bilateral: Secondary | ICD-10-CM | POA: Diagnosis not present

## 2021-05-16 DIAGNOSIS — H524 Presbyopia: Secondary | ICD-10-CM | POA: Diagnosis not present

## 2021-05-18 DIAGNOSIS — Z79899 Other long term (current) drug therapy: Secondary | ICD-10-CM | POA: Diagnosis not present

## 2021-05-19 ENCOUNTER — Telehealth (INDEPENDENT_AMBULATORY_CARE_PROVIDER_SITE_OTHER): Payer: Self-pay | Admitting: *Deleted

## 2021-05-19 ENCOUNTER — Other Ambulatory Visit (INDEPENDENT_AMBULATORY_CARE_PROVIDER_SITE_OTHER): Payer: Self-pay

## 2021-05-19 ENCOUNTER — Encounter (INDEPENDENT_AMBULATORY_CARE_PROVIDER_SITE_OTHER): Payer: Self-pay | Admitting: *Deleted

## 2021-05-19 DIAGNOSIS — Z86010 Personal history of colonic polyps: Secondary | ICD-10-CM

## 2021-05-19 MED ORDER — PEG 3350-KCL-NA BICARB-NACL 420 G PO SOLR: 4000.0000 mL | Freq: Once | ORAL | 0 refills | Status: AC

## 2021-05-19 NOTE — Telephone Encounter (Signed)
Patient needs trilyte 

## 2021-05-24 IMAGING — US US ABDOMEN COMPLETE
1 series · 14 of 25 positions shown · non-contrast
Comparison: 03/28/2020

CLINICAL DATA: Cirrhosis

EXAM:
ABDOMEN ULTRASOUND COMPLETE

[Series 1: us abdomen complete · 0.26mm/px · 14 of 95 slices shown]
[im 1/95]
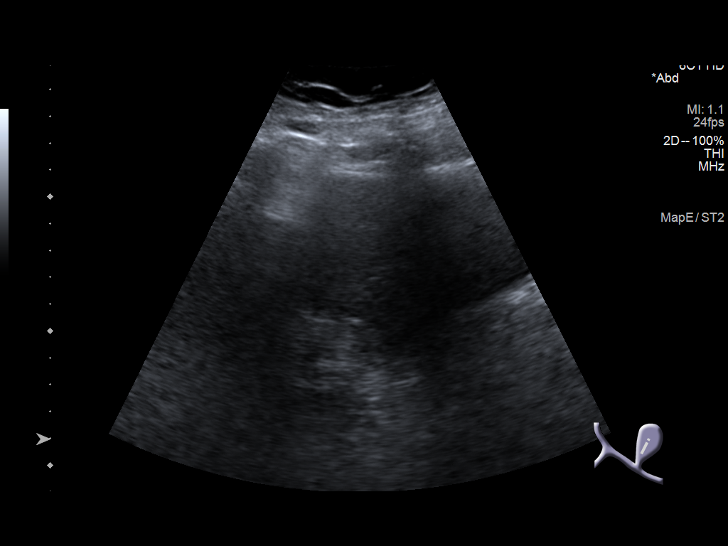
[im 8/95]
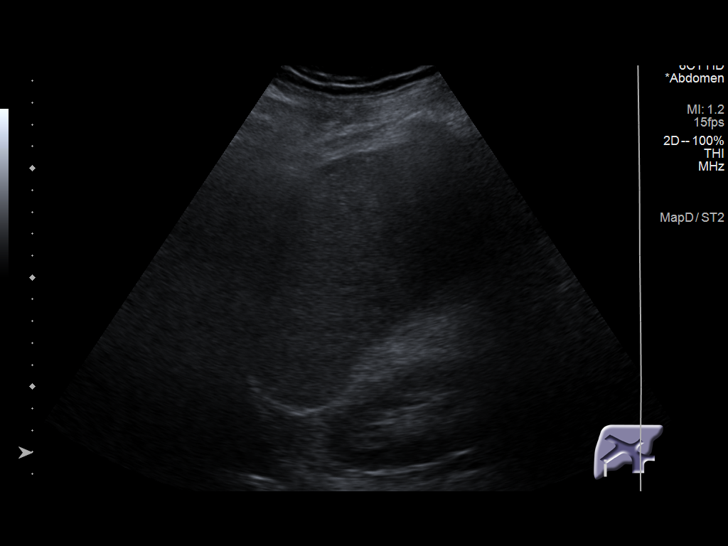
[im 16/95]
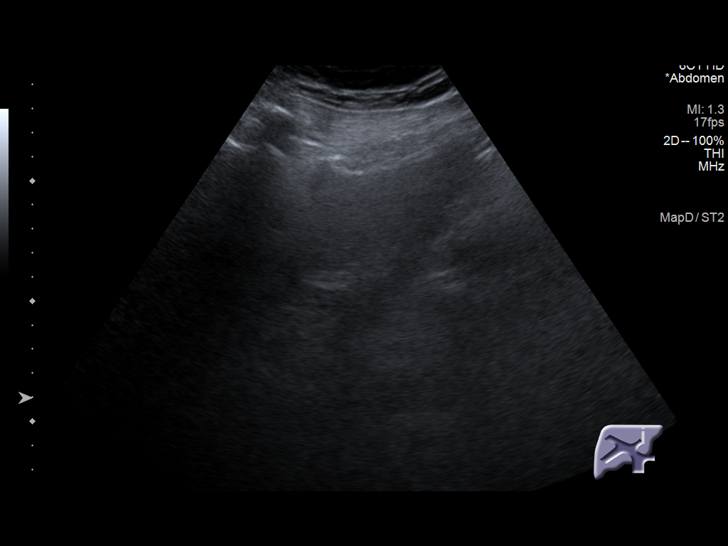
[im 24/95]
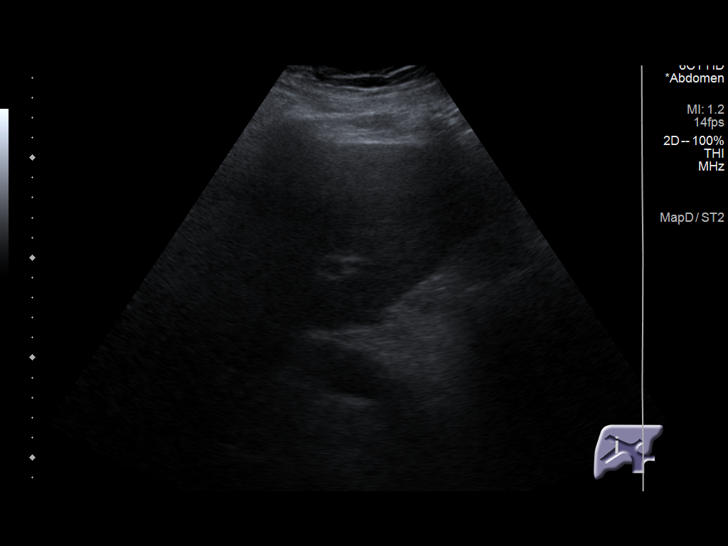
[im 32/95]
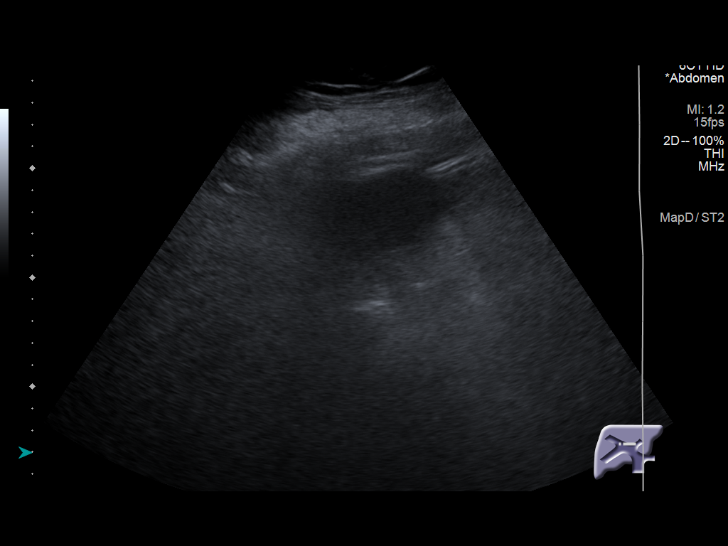
[im 36/95]
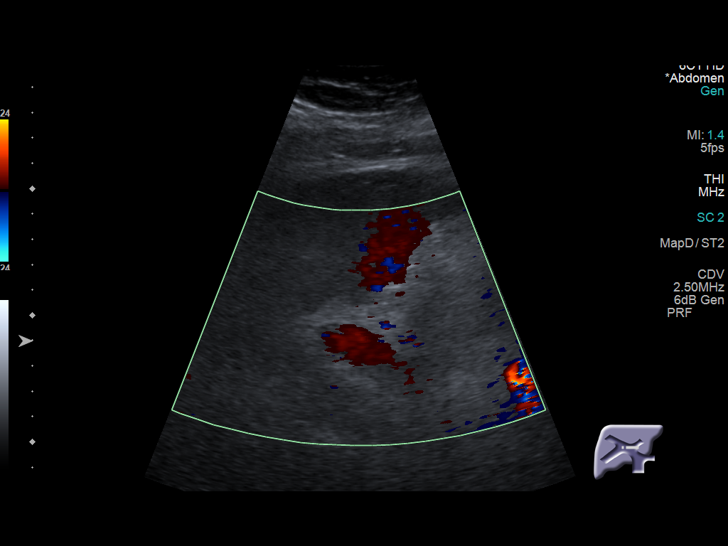
[im 44/95]
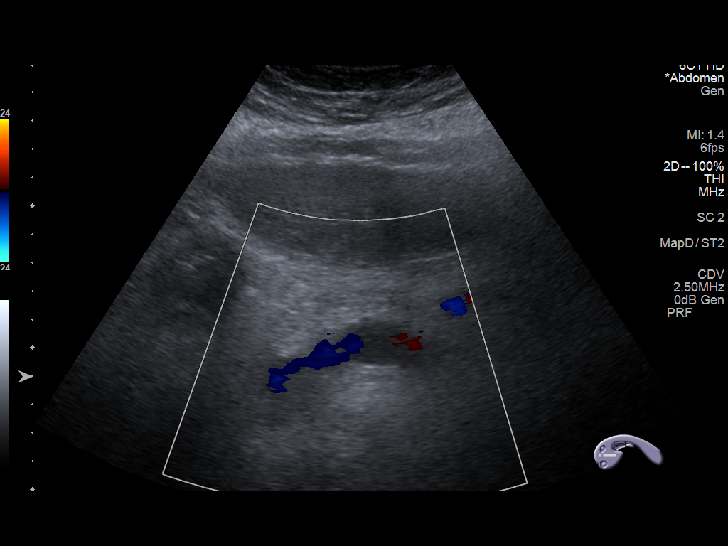
[im 51/95]
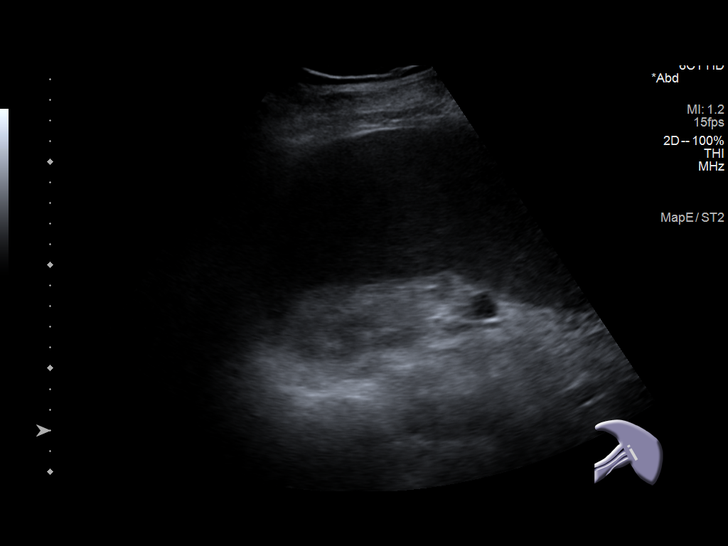
[im 59/95]
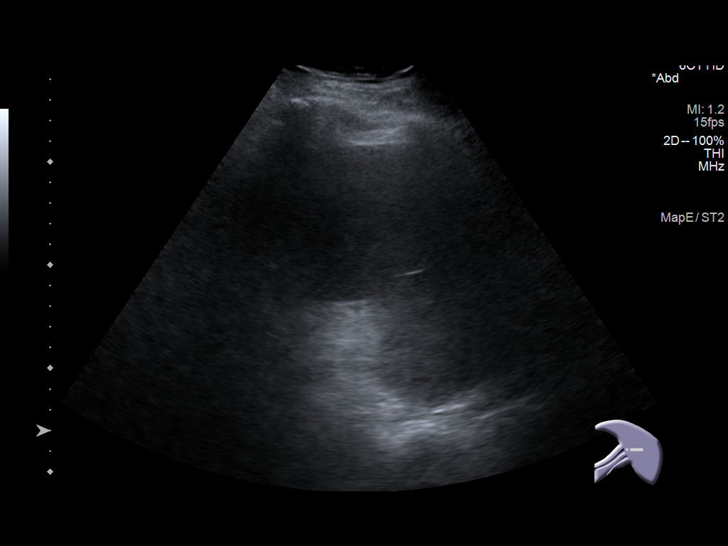
[im 63/95]
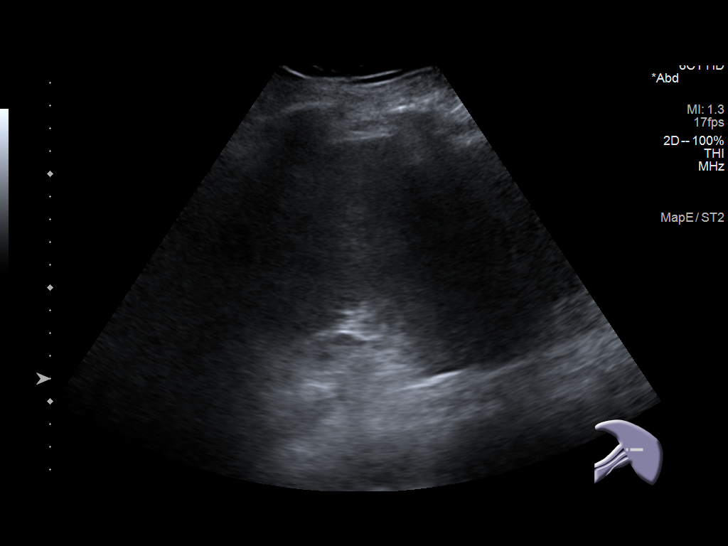
[im 71/95]
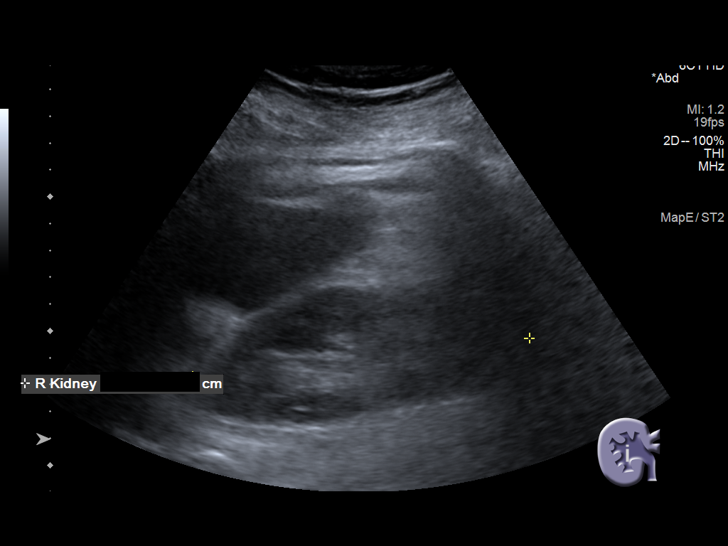
[im 79/95]
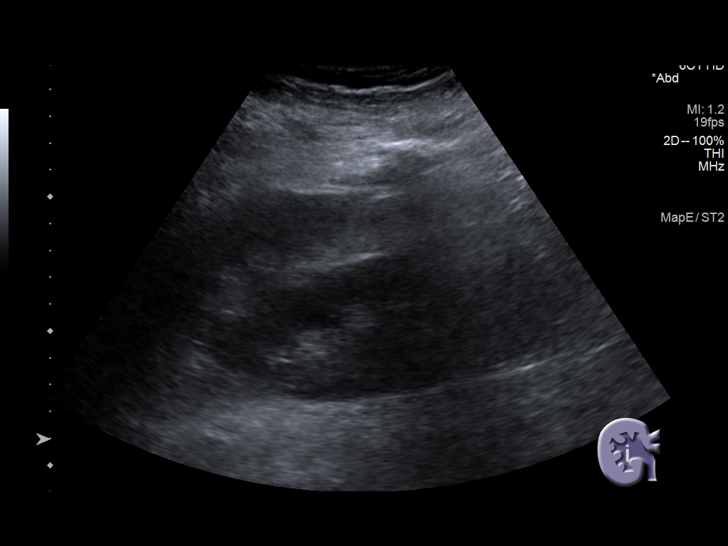
[im 87/95]
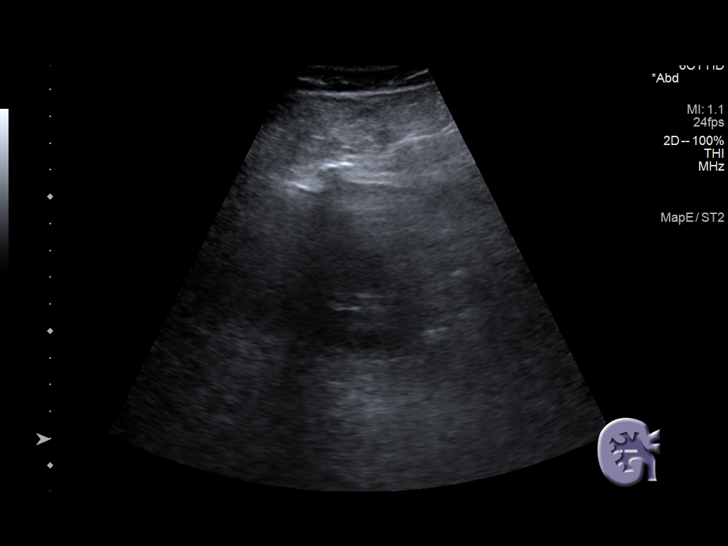
[im 95/95]
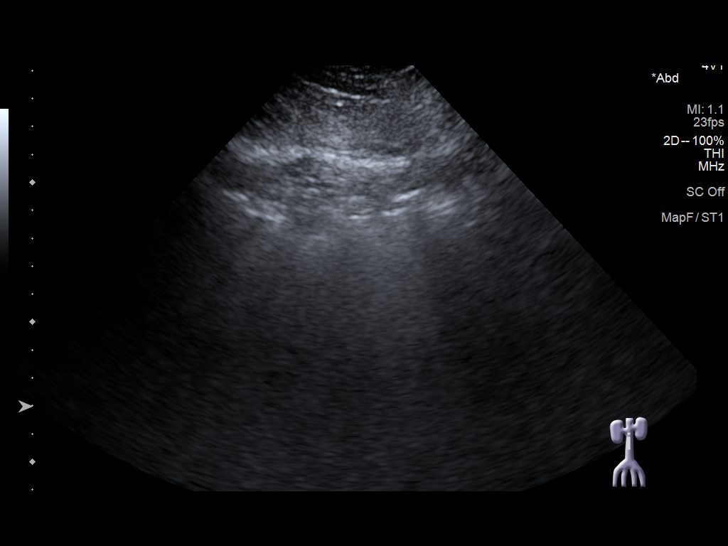

[14 of 25 positions shown; findings below may reference images not displayed]

FINDINGS: Gallbladder: Status post cholecystectomy

Common bile duct: Diameter: 3 mm

Liver: Coarsened increased liver echotexture with nodularity of the
liver capsule unchanged, consistent with known cirrhosis. No focal
parenchymal abnormality. Portal vein is patent on color Doppler
imaging with normal direction of blood flow towards the liver.

IVC: No abnormality visualized.

Pancreas: Visualized portion unremarkable.

Spleen: Spleen is enlarged measuring 20.9 x 17.1 x 10.7 cm, for a
volume of 6991 cc. No focal abnormality.

Right Kidney: Length: 12.6 cm. Echogenicity within normal limits. No
mass or hydronephrosis visualized.

Left Kidney: Length: Not well visualized due to bowel gas.

Abdominal aorta: Not well visualized due to bowel gas

Other findings: None.
IMPRESSION: 1. Nonvisualization of the left kidney or abdominal aorta due to
bowel gas.
2. Stable cirrhosis. No focal parenchymal abnormality by ultrasound.
3. Splenomegaly.

## 2021-05-25 DIAGNOSIS — Z79899 Other long term (current) drug therapy: Secondary | ICD-10-CM | POA: Diagnosis not present

## 2021-05-25 DIAGNOSIS — E876 Hypokalemia: Secondary | ICD-10-CM | POA: Diagnosis not present

## 2021-05-25 DIAGNOSIS — E039 Hypothyroidism, unspecified: Secondary | ICD-10-CM | POA: Diagnosis not present

## 2021-05-31 ENCOUNTER — Telehealth (INDEPENDENT_AMBULATORY_CARE_PROVIDER_SITE_OTHER): Payer: Self-pay | Admitting: *Deleted

## 2021-05-31 DIAGNOSIS — Z79899 Other long term (current) drug therapy: Secondary | ICD-10-CM | POA: Diagnosis not present

## 2021-05-31 NOTE — Telephone Encounter (Signed)
Referring MD/PCP: browning ? ?Procedure: tcs ? ?Reason/Indication:  hx polyps ? ?Has patient had this procedure before?  Yes, 12/2015 ? If so, when, by whom and where?   ? ?Is there a family history of colon cancer?  no ? Who?  What age when diagnosed?   ? ?Is patient diabetic? If yes, Type 1 or Type 2   no ?     ?Does patient have prosthetic heart valve or mechanical valve?  no ? ?Do you have a pacemaker/defibrillator?  no ? ?Has patient ever had endocarditis/atrial fibrillation? no ? ?Does patient use oxygen? no ? ?Has patient had joint replacement within last 12 months?  no ? ?Is patient constipated or do they take laxatives? no ? ?Does patient have a history of alcohol/drug use?  no ? ?Have you had a stroke/heart attack last 6 mths? no ? ?Do you take medicine for weight loss?  no ? ?For male patients,: have you had a hysterectomy  ?                     are you post menopausal  ?                     do you still have your menstrual cycle  ? ?Is patient on blood thinner such as Coumadin, Plavix and/or Aspirin? no ? ?Medications: xifaxan 550 mg bid, setraline 50 mg daily, potassium 40 mg tid, trazadone 50 mg nightly, hydroxyzine 50 mg bid, carvedilol 6.25 mg daily, famotidine 20 mg daily, levothyroxine 175 mcg daily, furosemide 40 mg daily, spironolactone 50 mg daily, pantoprazole 40 mg daily ? ?Allergies: nkda ? ?Medication Adjustment per Dr Rehman/Dr Jenetta Downer  ? ?Procedure date & time: 06/28/21  ? ? ?

## 2021-06-07 DIAGNOSIS — Z79899 Other long term (current) drug therapy: Secondary | ICD-10-CM | POA: Diagnosis not present

## 2021-06-09 DIAGNOSIS — Z713 Dietary counseling and surveillance: Secondary | ICD-10-CM | POA: Diagnosis not present

## 2021-06-09 DIAGNOSIS — R609 Edema, unspecified: Secondary | ICD-10-CM | POA: Diagnosis not present

## 2021-06-09 DIAGNOSIS — Z7182 Exercise counseling: Secondary | ICD-10-CM | POA: Diagnosis not present

## 2021-06-09 DIAGNOSIS — Z23 Encounter for immunization: Secondary | ICD-10-CM | POA: Diagnosis not present

## 2021-06-09 DIAGNOSIS — N182 Chronic kidney disease, stage 2 (mild): Secondary | ICD-10-CM | POA: Diagnosis not present

## 2021-06-09 DIAGNOSIS — Z6841 Body Mass Index (BMI) 40.0 and over, adult: Secondary | ICD-10-CM | POA: Diagnosis not present

## 2021-06-09 DIAGNOSIS — R69 Illness, unspecified: Secondary | ICD-10-CM | POA: Diagnosis not present

## 2021-06-09 DIAGNOSIS — R188 Other ascites: Secondary | ICD-10-CM | POA: Diagnosis not present

## 2021-06-09 DIAGNOSIS — E039 Hypothyroidism, unspecified: Secondary | ICD-10-CM | POA: Diagnosis not present

## 2021-06-09 DIAGNOSIS — K7460 Unspecified cirrhosis of liver: Secondary | ICD-10-CM | POA: Diagnosis not present

## 2021-06-21 DIAGNOSIS — E039 Hypothyroidism, unspecified: Secondary | ICD-10-CM | POA: Diagnosis not present

## 2021-06-21 DIAGNOSIS — K7460 Unspecified cirrhosis of liver: Secondary | ICD-10-CM | POA: Diagnosis not present

## 2021-06-23 NOTE — Patient Instructions (Signed)
? ? ? ? ? ? Daniel Crawford ? 06/23/2021  ?  ? '@PREFPERIOPPHARMACY'$ @ ? ? Your procedure is scheduled on  06/28/2021. ? ? Report to [name] at  [phone] A.M. ? ? Call this number if you have problems the morning of surgery: ? [phone] ? ? Remember: ? Follow the diet and prep instructions given to yo by the office. ? ?Use your inhaler before you come and bring your rescue inhaler with you. ?  ? Take these medicines the morning of surgery with A SIP OF WATER  ? ?carvedilol, pepcid, vistaril, levothyroxine, protonix. ? ?  ? Do not wear jewelry, make-up or nail polish. ? Do not wear lotions, powders, or perfumes, or deodorant. ? Do not shave 48 hours prior to surgery.  Men [place] shave face and neck. ? Do not bring valuables to the hospital. ? [facility] is not responsible for any belongings or valuables. ? ?Contacts, dentures or bridgework [place] not be worn into surgery.  Leave your suitcase in the car.  After surgery it Mone be brought to your room. ? ?For patients admitted to the hospital, discharge time will be determined by your treatment team. ? ?Patients discharged the day of surgery will not be allowed to drive home and must have someone with them for 24 hours.  ? ? ?Special instructions:   DO NOT smoke tobacco or vape for 24 hours before your procedure. ? ?Please read over the following fact sheets that you were given. ?Anesthesia Post-op Instructions and Care and Recovery After Surgery ?  ? ? ? Colonoscopy, Adult, Care After ?This sheet gives you information about how to care for yourself after your procedure. Your health care provider [place] also give you more specific instructions. If you have problems or questions, contact your health care provider. ?What can I expect after the procedure? ?After the procedure, it is common to have: ?A small amount of blood in your stool for 24 hours after the procedure. ?Some gas. ?Mild cramping or bloating of your abdomen. ?Follow these instructions at home: ?Eating and drinking ? ?Drink  enough fluid to keep your urine pale yellow. ?Follow instructions from your health care provider about eating or drinking restrictions. ?Resume your normal diet as instructed by your health care provider. Avoid heavy or fried foods that are hard to digest. ?Activity ?Rest as told by your health care provider. ?Avoid sitting for a long time without moving. Get up to take short walks every 1-2 hours. This is important to improve blood flow and breathing. Ask for help if you feel weak or unsteady. ?Return to your normal activities as told by your health care provider. Ask your health care provider what activities are safe for you. ?Managing cramping and bloating ? ?Try walking around when you have cramps or feel bloated. ?Apply heat to your abdomen as told by your health care provider. Use the heat source that your health care provider recommends, such as a moist heat pack or a heating pad. ?Place a towel between your skin and the heat source. ?Leave the heat on for 20-30 minutes. ?Remove the heat if your skin turns bright red. This is especially important if you are unable to feel pain, heat, or cold. You [place] have a greater risk of getting burned. ?General instructions ?If you were given a sedative during the procedure, it can affect you for several hours. Do not drive or operate machinery until your health care provider says that it is safe. ?For the first  24 hours after the procedure: ?Do not sign important documents. ?Do not drink alcohol. ?Do your regular daily activities at a slower pace than normal. ?Eat soft foods that are easy to digest. ?Take over-the-counter and prescription medicines only as told by your health care provider. ?Keep all follow-up visits as told by your health care provider. This is important. ?Contact a health care provider if: ?You have blood in your stool 2-3 days after the procedure. ?Get help right away if you have: ?More than a small spotting of blood in your stool. ?Large blood clots  in your stool. ?Swelling of your abdomen. ?Nausea or vomiting. ?A fever. ?Increasing pain in your abdomen that is not relieved with medicine. ?Summary ?After the procedure, it is common to have a small amount of blood in your stool. You [place] also have mild cramping and bloating of your abdomen. ?If you were given a sedative during the procedure, it can affect you for several hours. Do not drive or operate machinery until your health care provider says that it is safe. ?Get help right away if you have a lot of blood in your stool, nausea or vomiting, a fever, or increased pain in your abdomen. ?This information is not intended to replace advice given to you by your health care provider. Make sure you discuss any questions you have with your health care provider. ?Document Revised: 01/16/2019 Document Reviewed: 10/06/2018 ?Elsevier Patient Education ? [place]. ?Monitored Anesthesia Care, Care After ?This sheet gives you information about how to care for yourself after your procedure. Your health care provider [place] also give you more specific instructions. If you have problems or questions, contact your health care provider. ?What can I expect after the procedure? ?After the procedure, it is common to have: ?Tiredness. ?Forgetfulness about what happened after the procedure. ?Impaired judgment for important decisions. ?Nausea or vomiting. ?Some difficulty with balance. ?Follow these instructions at home: ?For the time period you were told by your health care provider: ?  ?Rest as needed. ?Do not participate in activities where you could fall or become injured. ?Do not drive or use machinery. ?Do not drink alcohol. ?Do not take sleeping pills or medicines that cause drowsiness. ?Do not make important decisions or sign legal documents. ?Do not take care of children on your own. ?Eating and drinking ?Follow the diet that is recommended by your health care provider. ?Drink enough fluid to keep your urine pale  yellow. ?If you vomit: ?Drink water, juice, or soup when you can drink without vomiting. ?Make sure you have little or no nausea before eating solid foods. ?General instructions ?Have a responsible adult stay with you for the time you are told. It is important to have someone help care for you until you are awake and alert. ?Take over-the-counter and prescription medicines only as told by your health care provider. ?If you have sleep apnea, surgery and certain medicines can increase your risk for breathing problems. Follow instructions from your health care provider about wearing your sleep device: ?Anytime you are sleeping, including during daytime naps. ?While taking prescription pain medicines, sleeping medicines, or medicines that make you drowsy. ?Avoid smoking. ?Keep all follow-up visits as told by your health care provider. This is important. ?Contact a health care provider if: ?You keep feeling nauseous or you keep vomiting. ?You feel light-headed. ?You are still sleepy or having trouble with balance after 24 hours. ?You develop a rash. ?You have a fever. ?You have redness or swelling around the IV  site. ?Get help right away if: ?You have trouble breathing. ?You have new-onset confusion at home. ?Summary ?For several hours after your procedure, you [place] feel tired. You Ates also be forgetful and have poor judgment. ?Have a responsible adult stay with you for the time you are told. It is important to have someone help care for you until you are awake and alert. ?Rest as told. Do not drive or operate machinery. Do not drink alcohol or take sleeping pills. ?Get help right away if you have trouble breathing, or if you suddenly become confused. ?This information is not intended to replace advice given to you by your health care provider. Make sure you discuss any questions you have with your health care provider. ?Document Revised: 11/26/2019 Document Reviewed: 02/12/2019 ?Elsevier Patient Education ? [place]. ? ?

## 2021-06-26 ENCOUNTER — Encounter (HOSPITAL_COMMUNITY)
Admission: RE | Admit: 2021-06-26 | Discharge: 2021-06-26 | Disposition: A | Discharge status: Home / Self Care | Payer: Medicare HMO | Source: Ambulatory Visit | Attending: Internal Medicine | Admitting: Internal Medicine

## 2021-06-26 ENCOUNTER — Encounter (HOSPITAL_COMMUNITY): Payer: Self-pay

## 2021-06-26 VITALS — BP 130/57 | HR 74 | Temp 98.5°F | Resp 18 | Ht 73.0 in | Wt 372.0 lb

## 2021-06-26 DIAGNOSIS — K7460 Unspecified cirrhosis of liver: Secondary | ICD-10-CM | POA: Diagnosis not present

## 2021-06-26 DIAGNOSIS — Z86010 Personal history of colonic polyps: Secondary | ICD-10-CM

## 2021-06-26 DIAGNOSIS — Z01812 Encounter for preprocedural laboratory examination: Secondary | ICD-10-CM | POA: Diagnosis not present

## 2021-06-26 DIAGNOSIS — K7581 Nonalcoholic steatohepatitis (NASH): Secondary | ICD-10-CM | POA: Insufficient documentation

## 2021-06-26 HISTORY — DX: Type 2 diabetes mellitus without complications: E11.9

## 2021-06-26 LAB — COMPREHENSIVE METABOLIC PANEL
ALT: 24 U/L (ref 0–44)
AST: 55 U/L — ABNORMAL HIGH (ref 15–41)
Albumin: 3 g/dL — ABNORMAL LOW (ref 3.5–5.0)
Alkaline Phosphatase: 81 U/L (ref 38–126)
Anion gap: 8 (ref 5–15)
BUN: 18 mg/dL (ref 8–23)
CO2: 29 mmol/L (ref 22–32)
Calcium: 8.4 mg/dL — ABNORMAL LOW (ref 8.9–10.3)
Chloride: 99 mmol/L (ref 98–111)
Creatinine, Ser: 1.25 mg/dL — ABNORMAL HIGH (ref 0.61–1.24)
GFR, Estimated: >60 mL/min (ref >60)
Glucose, Bld: 191 mg/dL — ABNORMAL HIGH (ref 70–99)
Potassium: 3.4 mmol/L — ABNORMAL LOW (ref 3.5–5.1)
Sodium: 136 mmol/L (ref 135–145)
Total Bilirubin: 2 mg/dL — ABNORMAL HIGH (ref 0.3–1.2)
Total Protein: 6.6 g/dL (ref 6.5–8.1)

## 2021-06-26 LAB — CBC WITH DIFFERENTIAL/PLATELET
Abs Immature Granulocytes: 0.01 10*3/uL (ref 0.00–0.07)
Basophils Absolute: 0 10*3/uL (ref 0.0–0.1)
Basophils Relative: 1 %
Eosinophils Absolute: 0.1 10*3/uL (ref 0.0–0.5)
Eosinophils Relative: 5 %
HCT: 35.2 % — ABNORMAL LOW (ref 39.0–52.0)
Hemoglobin: 10.9 g/dL — ABNORMAL LOW (ref 13.0–17.0)
Immature Granulocytes: 0 %
Lymphocytes Relative: 23 %
Lymphs Abs: 0.6 10*3/uL — ABNORMAL LOW (ref 0.7–4.0)
MCH: 27.1 pg (ref 26.0–34.0)
MCHC: 31 g/dL (ref 30.0–36.0)
MCV: 87.6 fL (ref 80.0–100.0)
Monocytes Absolute: 0.3 10*3/uL (ref 0.1–1.0)
Monocytes Relative: 10 %
Neutro Abs: 1.5 10*3/uL — ABNORMAL LOW (ref 1.7–7.7)
Neutrophils Relative %: 61 %
Platelets: 63 10*3/uL — ABNORMAL LOW (ref 150–400)
RBC: 4.02 MIL/uL — ABNORMAL LOW (ref 4.22–5.81)
RDW: 15.7 % — ABNORMAL HIGH (ref 11.5–15.5)
WBC: 2.5 10*3/uL — ABNORMAL LOW (ref 4.0–10.5)
nRBC: 0 % (ref 0.0–0.2)

## 2021-06-26 LAB — PROTIME-INR
INR: 1.3 — ABNORMAL HIGH (ref 0.8–1.2)
Prothrombin Time: 16.4 seconds — ABNORMAL HIGH (ref 11.4–15.2)

## 2021-06-28 ENCOUNTER — Encounter (HOSPITAL_COMMUNITY): Payer: Self-pay | Admitting: Internal Medicine

## 2021-06-28 ENCOUNTER — Ambulatory Visit (HOSPITAL_COMMUNITY): Payer: Medicare HMO | Admitting: Anesthesiology

## 2021-06-28 ENCOUNTER — Encounter (HOSPITAL_COMMUNITY)
Admission: RE | Disposition: A | Discharge status: Home / Self Care | Payer: Self-pay | Source: Home / Self Care | Attending: Internal Medicine

## 2021-06-28 ENCOUNTER — Ambulatory Visit (HOSPITAL_COMMUNITY)
Admission: RE | Admit: 2021-06-28 | Discharge: 2021-06-28 | Disposition: A | Discharge status: Home / Self Care | Payer: Medicare HMO | Attending: Internal Medicine | Admitting: Internal Medicine

## 2021-06-28 ENCOUNTER — Ambulatory Visit (HOSPITAL_BASED_OUTPATIENT_CLINIC_OR_DEPARTMENT_OTHER): Payer: Medicare HMO | Admitting: Anesthesiology

## 2021-06-28 DIAGNOSIS — K635 Polyp of colon: Secondary | ICD-10-CM

## 2021-06-28 DIAGNOSIS — E1122 Type 2 diabetes mellitus with diabetic chronic kidney disease: Secondary | ICD-10-CM | POA: Diagnosis not present

## 2021-06-28 DIAGNOSIS — Z86010 Personal history of colonic polyps: Secondary | ICD-10-CM

## 2021-06-28 DIAGNOSIS — Z7984 Long term (current) use of oral hypoglycemic drugs: Secondary | ICD-10-CM | POA: Insufficient documentation

## 2021-06-28 DIAGNOSIS — E119 Type 2 diabetes mellitus without complications: Secondary | ICD-10-CM

## 2021-06-28 DIAGNOSIS — Z87891 Personal history of nicotine dependence: Secondary | ICD-10-CM | POA: Insufficient documentation

## 2021-06-28 DIAGNOSIS — Z1211 Encounter for screening for malignant neoplasm of colon: Secondary | ICD-10-CM | POA: Insufficient documentation

## 2021-06-28 DIAGNOSIS — Z09 Encounter for follow-up examination after completed treatment for conditions other than malignant neoplasm: Secondary | ICD-10-CM | POA: Diagnosis not present

## 2021-06-28 DIAGNOSIS — K644 Residual hemorrhoidal skin tags: Secondary | ICD-10-CM | POA: Diagnosis not present

## 2021-06-28 DIAGNOSIS — E039 Hypothyroidism, unspecified: Secondary | ICD-10-CM | POA: Insufficient documentation

## 2021-06-28 DIAGNOSIS — Z6841 Body Mass Index (BMI) 40.0 and over, adult: Secondary | ICD-10-CM | POA: Diagnosis not present

## 2021-06-28 DIAGNOSIS — Z79890 Hormone replacement therapy (postmenopausal): Secondary | ICD-10-CM | POA: Insufficient documentation

## 2021-06-28 DIAGNOSIS — K648 Other hemorrhoids: Secondary | ICD-10-CM

## 2021-06-28 DIAGNOSIS — Z79899 Other long term (current) drug therapy: Secondary | ICD-10-CM | POA: Diagnosis not present

## 2021-06-28 DIAGNOSIS — K219 Gastro-esophageal reflux disease without esophagitis: Secondary | ICD-10-CM | POA: Insufficient documentation

## 2021-06-28 DIAGNOSIS — F32A Depression, unspecified: Secondary | ICD-10-CM | POA: Diagnosis not present

## 2021-06-28 DIAGNOSIS — K7460 Unspecified cirrhosis of liver: Secondary | ICD-10-CM | POA: Insufficient documentation

## 2021-06-28 DIAGNOSIS — D123 Benign neoplasm of transverse colon: Secondary | ICD-10-CM | POA: Insufficient documentation

## 2021-06-28 DIAGNOSIS — N189 Chronic kidney disease, unspecified: Secondary | ICD-10-CM | POA: Diagnosis not present

## 2021-06-28 DIAGNOSIS — K7581 Nonalcoholic steatohepatitis (NASH): Secondary | ICD-10-CM | POA: Insufficient documentation

## 2021-06-28 DIAGNOSIS — R69 Illness, unspecified: Secondary | ICD-10-CM | POA: Diagnosis not present

## 2021-06-28 HISTORY — PX: COLONOSCOPY WITH PROPOFOL: SHX5780

## 2021-06-28 HISTORY — PX: POLYPECTOMY: SHX149

## 2021-06-28 LAB — GLUCOSE, CAPILLARY: Glucose-Capillary: 120 mg/dL — ABNORMAL HIGH (ref 70–99)

## 2021-06-28 LAB — HM COLONOSCOPY

## 2021-06-28 SURGERY — COLONOSCOPY WITH PROPOFOL
Anesthesia: General

## 2021-06-28 MED ORDER — STERILE WATER FOR IRRIGATION IR SOLN
Status: DC | PRN
  Administered 2021-06-28: 100 mL

## 2021-06-28 MED ORDER — PROPOFOL 10 MG/ML IV BOLUS
INTRAVENOUS | Status: DC | PRN
  Administered 2021-06-28: 20 mg via INTRAVENOUS
  Administered 2021-06-28 (×2): 30 mg via INTRAVENOUS
  Administered 2021-06-28: 120 mg via INTRAVENOUS

## 2021-06-28 MED ORDER — PHENYLEPHRINE 40 MCG/ML (10ML) SYRINGE FOR IV PUSH (FOR BLOOD PRESSURE SUPPORT)
PREFILLED_SYRINGE | INTRAVENOUS | Status: DC | PRN
Start: 2021-06-28 — End: 2021-06-28
  Administered 2021-06-28: 120 ug via INTRAVENOUS
  Administered 2021-06-28: 80 ug via INTRAVENOUS
  Administered 2021-06-28: 120 ug via INTRAVENOUS
  Administered 2021-06-28: 80 ug via INTRAVENOUS
  Administered 2021-06-28: 120 ug via INTRAVENOUS

## 2021-06-28 MED ORDER — PROPOFOL 500 MG/50ML IV EMUL
INTRAVENOUS | Status: DC | PRN
  Administered 2021-06-28: 200 ug/kg/min via INTRAVENOUS

## 2021-06-28 MED ORDER — LIDOCAINE 2% (20 MG/ML) 5 ML SYRINGE
INTRAMUSCULAR | Status: DC | PRN
  Administered 2021-06-28: 50 mg via INTRAVENOUS

## 2021-06-28 MED ORDER — LACTATED RINGERS IV SOLN
INTRAVENOUS | Status: DC
  Administered 2021-06-28: 1000 mL via INTRAVENOUS

## 2021-06-28 NOTE — Anesthesia Postprocedure Evaluation (Signed)
Anesthesia Post Note ? ?Patient: Emil Weigold ? ?Procedure(s) Performed: COLONOSCOPY WITH PROPOFOL ?POLYPECTOMY INTESTINAL ? ?Patient location during evaluation: Phase II ?Anesthesia Type: General ?Level of consciousness: awake and alert and oriented ?Pain management: pain level controlled ?Vital Signs Assessment: post-procedure vital signs reviewed and stable ?Respiratory status: spontaneous breathing, nonlabored ventilation and respiratory function stable ?Cardiovascular status: blood pressure returned to baseline and stable ?Postop Assessment: no apparent nausea or vomiting ?Anesthetic complications: no ? ? ?No notable events documented. ? ? ?Last Vitals:  ?Vitals:  ? 06/28/21 0913 06/28/21 1109  ?BP: (!) 145/70 (!) 103/56  ?Resp: 18 11  ?Temp: 36.7 ?C 36.6 ?C  ?SpO2: 94% 94%  ?  ?Last Pain:  ?Vitals:  ? 06/28/21 1109  ?TempSrc: Axillary  ?PainSc: 0-No pain  ? ? ?  ?  ?  ?  ?  ?  ? ?Walaa Carel C Teonna Coonan ? ? ? ? ?

## 2021-06-28 NOTE — H&P (Signed)
Daniel Crawford is an 66 y.o. male.   ?Chief Complaint: Patient is here for colonoscopy ?HPI: Patient is 66 year old Caucasian male who has a history of colonic adenomas and is here for surveillance examination.  He had colonoscopy back in October 2017 in [facility].  He had a large polyp at cecum.  Biopsy revealed tubular adenoma.  He had other tubular adenomas.  He was sent over to [facility].  This polyp removed.  He denies change in bowel habits or rectal bleeding.  He has occasional right lower quadrant abdominal pain. ?Patient's history significant for cirrhosis secondary to NASH complicated by thrombocytopenia hepatic encephalopathy and fluid overload. ?Preprocedure lab studies pertinent for platelet count of 63,000 and INR is 1.3. ?Family history is negative for CRC. ? ?Past Medical History:  ?Diagnosis Date  ? CKD (chronic kidney disease)   ? Depression   ? Diabetes mellitus without complication ([place])   ? Hypothyroidism   ? Liver cirrhosis secondary to NASH [facility])   ? NASH (nonalcoholic steatohepatitis)   ? Spleen enlarged   ? Thrombocytopenia ([place])   ? ? ?Past Surgical History:  ?Procedure Laterality Date  ? Bilateral hernia surgery    ? 2006, 2007  ? CHOLECYSTECTOMY    ? 2016   ? ESOPHAGEAL DILATION N/A 11/06/2019  ? Procedure: ESOPHAGEAL DILATION;  Surgeon: Montez Morita, Quillian Quince, MD;  [facility];  Service: Gastroenterology;  Laterality: N/A;  ? ESOPHAGOGASTRODUODENOSCOPY (EGD) WITH PROPOFOL N/A 11/06/2019  ? Procedure: ESOPHAGOGASTRODUODENOSCOPY (EGD) WITH PROPOFOL;  Surgeon: Harvel Quale, MD;  Location: [facility];  Service: Gastroenterology;  Laterality: N/A;  1045  ? LAPAROSCOPIC ASSISTED VENTRAL HERNIA REPAIR    ? Polyp removed    ? in January of 2018.   ? ? ?Family History  ?Problem Relation Age of Onset  ? Liver cancer Mother   ? Heart disease Mother   ? Aneurysm Sister   ? ?Social History:  reports that he quit smoking about 6 years ago. His smoking use included  cigarettes. He has a 20.00 pack-year smoking history. He has never used smokeless tobacco. He reports that he does not drink alcohol and does not use drugs. ? ?Allergies: No Known Allergies ? ?Medications Prior to Admission  ?Medication Sig Dispense Refill  ? acetaminophen (TYLENOL) 500 MG tablet Take 500-1,000 mg by mouth every 8 (eight) hours as needed for pain.    ? albuterol (VENTOLIN HFA) 108 (90 Base) MCG/ACT inhaler TAKE 2 PUFFS BY MOUTH EVERY 6 HOURS AS NEEDED FOR WHEEZE OR SHORTNESS OF BREATH (Patient taking differently: Inhale 2 puffs into the lungs every 6 (six) hours as needed for shortness of breath.) 8.5 each 3  ? carvedilol (COREG) 12.5 MG tablet Take 12.5 mg by mouth 2 (two) times daily with a meal.    ? famotidine (PEPCID) 20 MG tablet Take 20 mg by mouth daily.    ? furosemide (LASIX) 40 MG tablet Take 1 tablet (40 mg total) by mouth daily. 30 tablet 0  ? gabapentin (NEURONTIN) 100 MG capsule Take 100 mg by mouth at bedtime as needed (pain).    ? hydrOXYzine (VISTARIL) 50 MG capsule Take 50 mg by mouth 2 (two) times daily as needed for anxiety.    ? levothyroxine (SYNTHROID) 175 MCG tablet Take 175 mcg by mouth daily before breakfast.    ? pantoprazole (PROTONIX) 40 MG tablet Take 40 mg by mouth daily.    ? potassium chloride SA (KLOR-CON M) 20 MEQ tablet Take 2  tablets (40 mEq total) by mouth 2 (two) times daily. 90 tablet 0  ? rifaximin (XIFAXAN) 550 MG TABS tablet Take 550 mg by mouth 2 (two) times daily.    ? sertraline (ZOLOFT) 50 MG tablet Take 50 mg by mouth at bedtime.    ? spironolactone (ALDACTONE) 50 MG tablet Take 1 tablet (50 mg total) by mouth daily. 30 tablet 0  ? traZODone (DESYREL) 50 MG tablet Take 50 mg by mouth at bedtime.    ? carvedilol (COREG) 6.25 MG tablet Take 1 tablet (6.25 mg total) by mouth 2 (two) times daily with a meal. (Patient not taking: Reported on 06/21/2021) 60 tablet 0  ? ? ?Results for orders placed or performed during the hospital encounter of 06/28/21 (from  the past 48 hour(s))  ?Glucose, capillary     Status: Abnormal  ? Collection Time: 06/28/21  9:15 AM  ?Result Value Ref Range  ? Glucose-Capillary 120 (H) 70 - 99 mg/dL  ?  Comment: Glucose reference range applies only to samples taken after fasting for at least 8 hours.  ? ?No results found. ? ?Review of Systems ? ?Blood pressure (!) 145/70, temperature 98.1 ?F (36.7 ?C), temperature source Oral, resp. rate 18, SpO2 94 %. ?Physical Exam ?HENT:  ?   Mouth/Throat:  ?   Mouth: Mucous membranes are moist.  ?   Pharynx: Oropharynx is clear.  ?Eyes:  ?   General: No scleral icterus. ?   Conjunctiva/sclera: Conjunctivae normal.  ?Cardiovascular:  ?   Rate and Rhythm: Normal rate and regular rhythm.  ?   Heart sounds: Normal heart sounds. No murmur heard. ?Pulmonary:  ?   Effort: Pulmonary effort is normal.  ?   Breath sounds: Normal breath sounds.  ?Abdominal:  ?   General: There is no distension.  ?   Palpations: Abdomen is soft. There is no mass.  ?   Tenderness: There is no abdominal tenderness.  ?Musculoskeletal:     ?   General: Swelling present.  ?   Cervical back: Neck supple.  ?   Comments: 2-3+ pitting edema involving both legs to the level of knees.  ?Lymphadenopathy:  ?   Cervical: No cervical adenopathy.  ?Skin: ?   General: Skin is warm and dry.  ?Neurological:  ?   Mental Status: He is alert.  ?  ? ?Assessment/Plan ? ?History of colonic polyps. ?Surveillance colonoscopy. ? ?Hildred Laser, MD ?06/28/2021, 10:04 AM ? ? ? ?

## 2021-06-28 NOTE — Op Note (Signed)
[facility] ?Patient Name: Daniel Crawford ?Procedure Date: 06/28/2021 9:43 AM ?MRN: [id number] ?Date of Birth: Sep 15, 1955 ?Attending MD: Hildred Laser , MD ?CSN: [id number] ?Age: 66 ?Admit Type: Outpatient ?Procedure:                Colonoscopy ?Indications:              High risk colon cancer surveillance: Personal  ?                          history of colonic polyps ?Providers:                Hildred Laser, MD, [place] Page, Raphael Gibney,  ?                          Technician ?Referring MD:             [name], MD ?Medicines:                Propofol per Anesthesia ?Complications:            No immediate complications. ?Estimated Blood Loss:     Estimated blood loss was minimal. ?Procedure:                Pre-Anesthesia Assessment: ?                          - Prior to the procedure, a History and Physical  ?                          was performed, and patient medications and  ?                          allergies were reviewed. The patient's tolerance of  ?                          previous anesthesia was also reviewed. The risks  ?                          and benefits of the procedure and the sedation  ?                          options and risks were discussed with the patient.  ?                          All questions were answered, and informed consent  ?                          was obtained. Prior Anticoagulants: The patient has  ?                          taken no previous anticoagulant or antiplatelet  ?                          agents. ASA Grade Assessment: III - A patient with  ?  severe systemic disease. After reviewing the risks  ?                          and benefits, the patient was deemed in  ?                          satisfactory condition to undergo the procedure. ?                          After obtaining informed consent, the colonoscope  ?                          was passed under direct vision. Throughout the  ?                          procedure, the  patient's blood pressure, pulse, and  ?                          oxygen saturations were monitored continuously. The  ?                          [id number]-HQ190L ([id number]) scope was introduced through  ?                          the anus and advanced to the the cecum, identified  ?                          by appendiceal orifice and ileocecal valve. The  ?                          colonoscopy was technically difficult and complex  ?                          due to a redundant colon. Successful completion of  ?                          the procedure was aided by using manual pressure  ?                          and withdrawing the scope and replacing with the  ?                          adult endoscope. The patient tolerated the  ?                          procedure well. The quality of the bowel  ?                          preparation was good. The ileocecal valve,  ?                          appendiceal orifice, and rectum were photographed. ?Scope In: 10:15:18 AM ?Scope Out: 11:02:18 AM ?Scope Withdrawal Time: 0 hours 30 minutes 3 seconds  ?  Total Procedure Duration: 0 hours 47 minutes 0 seconds  ?Findings: ?     The perianal and digital rectal examinations were normal. ?     A medium size polyp was found in the cecum. ?     Five polyps were found in the transverse colon and hepatic flexure. The  ?     polyps were 4 to 7 mm in size. These polyps were removed with a cold  ?     snare. Resection and retrieval were complete. The pathology specimen was  ?     placed into Bottle Number 1. ?     External and internal hemorrhoids were found during retroflexion. The  ?     hemorrhoids were small. ?Impression:               - One medium size sessile polyp in the cecum. This  ?                          polyp could not be removed because of poor approach. ?                          - Five 4 to 7 mm polyps in the transverse colon and  ?                          at the hepatic flexure, removed with a cold snare.  ?                           Resected and retrieved. ?                          - External and internal hemorrhoids. ?Moderate Sedation: ?     Per Anesthesia Care ?Recommendation:           - Patient has a contact number available for  ?                          emergencies. The signs and symptoms of potential  ?                          delayed complications were discussed with the  ?                          patient. Return to normal activities tomorrow.  ?                          Written discharge instructions were provided to the  ?                          patient. ?                          - Resume previous diet today. ?                          - Continue present medications. ?                          -  No aspirin, ibuprofen, naproxen, or other  ?                          non-steroidal anti-inflammatory drugs. ?                          - Await pathology results. ?                          - Repeat colonoscopy (date not yet determined). ?Procedure Code(s):        --- Professional --- ?                          [phone], Colonoscopy, flexible; with removal of  ?                          tumor(s), polyp(s), or other lesion(s) by snare  ?                          technique ?Diagnosis Code(s):        --- Professional --- ?                          K63.5, Polyp of colon ?                          Z86.010, Personal history of colonic polyps ?                          K64.8, Other hemorrhoids ?CPT copyright 2019 American Medical Association. All rights reserved. ?The codes documented in this report are preliminary and upon coder review [place]  ?be revised to meet current compliance requirements. ?Hildred Laser, MD ?Hildred Laser, MD ?06/28/2021 11:14:17 AM ?This report has been signed electronically. ?Number of Addenda: 0 ?

## 2021-06-28 NOTE — Anesthesia Preprocedure Evaluation (Signed)
Anesthesia Evaluation  ?Patient identified by MRN, date of birth, ID band ?Patient awake ? ? ? ?Reviewed: ?Allergy & Precautions, NPO status , Patient's Chart, lab work & pertinent test results, reviewed documented beta blocker date and time  ? ?Airway ?Mallampati: III ? ?TM Distance: >3 FB ?Neck ROM: Full ? ? ? Dental ? ?(+) Edentulous Upper, Edentulous Lower ?  ?Pulmonary ?sleep apnea , former smoker,  ?  ?Pulmonary exam normal ?breath sounds clear to auscultation ? ? ? ? ? ? Cardiovascular ?(-) hypertensionPt. on home beta blockers ?+ DOE  ?Normal cardiovascular exam ?Rhythm:Regular Rate:Normal ? ? ?  ?Neuro/Psych ?PSYCHIATRIC DISORDERS Depression negative neurological ROS ?   ? GI/Hepatic ?GERD  Medicated,(+) Cirrhosis  ? ascites ?  ? , Hepatitis -Splenomegaly  ?  ?Endo/Other  ?diabetes, Well Controlled, Type 2, Oral Hypoglycemic AgentsHypothyroidism Morbid obesity ? Renal/GU ?Renal InsufficiencyRenal disease  ?negative genitourinary ?  ?Musculoskeletal ?negative musculoskeletal ROS ?(+)  ? Abdominal ?  ?Peds ?negative pediatric ROS ?(+)  Hematology ? ?(+) Blood dyscrasia (thrombocytopenia), anemia ,   ?Anesthesia Other Findings ? ? Reproductive/Obstetrics ?negative OB ROS ? ?  ? ? ? ? ? ? ? ? ? ? ? ? ? ?  ?  ? ? ? ? ? ? ? ?Anesthesia Physical ?Anesthesia Plan ? ?ASA: 3 ? ?Anesthesia Plan: General  ? ?Post-op Pain Management: Minimal or no pain anticipated  ? ?Induction: Intravenous ? ?PONV Risk Score and Plan: Propofol infusion ? ?Airway Management Planned: Nasal Cannula and Natural Airway ? ?Additional Equipment:  ? ?Intra-op Plan:  ? ?Post-operative Plan:  ? ?Informed Consent: I have reviewed the patients History and Physical, chart, labs and discussed the procedure including the risks, benefits and alternatives for the proposed anesthesia with the patient or authorized representative who has indicated his/her understanding and acceptance.  ? ? ? ? ? ?Plan Discussed with:  CRNA and Surgeon ? ?Anesthesia Plan Comments:   ? ? ? ? ? ?Anesthesia Quick Evaluation ? ?

## 2021-06-28 NOTE — Transfer of Care (Signed)
Immediate Anesthesia Transfer of Care Note ? ?Patient: Daniel Crawford ? ?Procedure(s) Performed: COLONOSCOPY WITH PROPOFOL ?POLYPECTOMY INTESTINAL ? ?Patient Location: Short Stay ? ?Anesthesia Type:MAC ? ?Level of Consciousness: awake, alert , oriented and patient cooperative ? ?Airway & Oxygen Therapy: Patient Spontanous Breathing and Patient connected to nasal cannula oxygen ? ?Post-op Assessment: Report given to RN, Post -op Vital signs reviewed and stable and Patient moving all extremities ? ?Post vital signs: Reviewed and stable ? ?Last Vitals:  ?Vitals Value Taken Time  ?BP    ?Temp    ?Pulse    ?Resp    ?SpO2    ? ? ?Last Pain:  ?Vitals:  ? 06/28/21 1008  ?TempSrc:   ?PainSc: 0-No pain  ?   ? ?Patients Stated Pain Goal: 6 (06/28/21 0913) ? ?Complications: No notable events documented. ?

## 2021-06-28 NOTE — Discharge Instructions (Addendum)
No aspirin or NSAIDs. Resume usual medications and diet as before. No driving for 24 hours. Physician will call with biopsy results.   

## 2021-06-30 LAB — SURGICAL PATHOLOGY

## 2021-07-03 ENCOUNTER — Encounter (INDEPENDENT_AMBULATORY_CARE_PROVIDER_SITE_OTHER): Payer: Self-pay | Admitting: *Deleted

## 2021-07-04 ENCOUNTER — Encounter (HOSPITAL_COMMUNITY): Payer: Self-pay | Admitting: Internal Medicine

## 2021-07-05 DIAGNOSIS — Z79899 Other long term (current) drug therapy: Secondary | ICD-10-CM | POA: Diagnosis not present

## 2021-07-07 DIAGNOSIS — G629 Polyneuropathy, unspecified: Secondary | ICD-10-CM | POA: Diagnosis not present

## 2021-07-07 DIAGNOSIS — E261 Secondary hyperaldosteronism: Secondary | ICD-10-CM | POA: Diagnosis not present

## 2021-07-07 DIAGNOSIS — K7460 Unspecified cirrhosis of liver: Secondary | ICD-10-CM | POA: Diagnosis not present

## 2021-07-07 DIAGNOSIS — H269 Unspecified cataract: Secondary | ICD-10-CM | POA: Diagnosis not present

## 2021-07-07 DIAGNOSIS — G4700 Insomnia, unspecified: Secondary | ICD-10-CM | POA: Diagnosis not present

## 2021-07-07 DIAGNOSIS — Z008 Encounter for other general examination: Secondary | ICD-10-CM | POA: Diagnosis not present

## 2021-07-07 DIAGNOSIS — R69 Illness, unspecified: Secondary | ICD-10-CM | POA: Diagnosis not present

## 2021-07-07 DIAGNOSIS — K739 Chronic hepatitis, unspecified: Secondary | ICD-10-CM | POA: Diagnosis not present

## 2021-07-07 DIAGNOSIS — I509 Heart failure, unspecified: Secondary | ICD-10-CM | POA: Diagnosis not present

## 2021-07-07 DIAGNOSIS — Z6841 Body Mass Index (BMI) 40.0 and over, adult: Secondary | ICD-10-CM | POA: Diagnosis not present

## 2021-07-07 DIAGNOSIS — E039 Hypothyroidism, unspecified: Secondary | ICD-10-CM | POA: Diagnosis not present

## 2021-07-11 DIAGNOSIS — R69 Illness, unspecified: Secondary | ICD-10-CM | POA: Diagnosis not present

## 2021-07-11 DIAGNOSIS — R609 Edema, unspecified: Secondary | ICD-10-CM | POA: Diagnosis not present

## 2021-07-11 DIAGNOSIS — Z79899 Other long term (current) drug therapy: Secondary | ICD-10-CM | POA: Diagnosis not present

## 2021-07-11 DIAGNOSIS — R410 Disorientation, unspecified: Secondary | ICD-10-CM | POA: Diagnosis not present

## 2021-07-11 DIAGNOSIS — E039 Hypothyroidism, unspecified: Secondary | ICD-10-CM | POA: Diagnosis not present

## 2021-07-11 DIAGNOSIS — R188 Other ascites: Secondary | ICD-10-CM | POA: Diagnosis not present

## 2021-07-11 DIAGNOSIS — Z6841 Body Mass Index (BMI) 40.0 and over, adult: Secondary | ICD-10-CM | POA: Diagnosis not present

## 2021-07-11 DIAGNOSIS — Z713 Dietary counseling and surveillance: Secondary | ICD-10-CM | POA: Diagnosis not present

## 2021-07-11 DIAGNOSIS — Z23 Encounter for immunization: Secondary | ICD-10-CM | POA: Diagnosis not present

## 2021-07-11 DIAGNOSIS — N182 Chronic kidney disease, stage 2 (mild): Secondary | ICD-10-CM | POA: Diagnosis not present

## 2021-07-11 DIAGNOSIS — K7460 Unspecified cirrhosis of liver: Secondary | ICD-10-CM | POA: Diagnosis not present

## 2021-07-18 ENCOUNTER — Other Ambulatory Visit: Payer: Self-pay | Admitting: Gastroenterology

## 2021-07-18 ENCOUNTER — Other Ambulatory Visit (HOSPITAL_COMMUNITY): Payer: Self-pay | Admitting: Gastroenterology

## 2021-07-18 DIAGNOSIS — K7469 Other cirrhosis of liver: Secondary | ICD-10-CM

## 2021-07-26 ENCOUNTER — Ambulatory Visit (HOSPITAL_COMMUNITY): Payer: Medicare HMO

## 2021-07-27 ENCOUNTER — Ambulatory Visit (INDEPENDENT_AMBULATORY_CARE_PROVIDER_SITE_OTHER): Payer: Medicare HMO | Admitting: Gastroenterology

## 2021-07-27 ENCOUNTER — Encounter (INDEPENDENT_AMBULATORY_CARE_PROVIDER_SITE_OTHER): Payer: Self-pay | Admitting: Gastroenterology

## 2021-07-27 VITALS — BP 123/72 | HR 73 | Temp 98.2°F | Ht 74.0 in | Wt 369.9 lb

## 2021-07-27 DIAGNOSIS — Z86010 Personal history of colonic polyps: Secondary | ICD-10-CM | POA: Diagnosis not present

## 2021-07-27 DIAGNOSIS — K7581 Nonalcoholic steatohepatitis (NASH): Secondary | ICD-10-CM

## 2021-07-27 DIAGNOSIS — R441 Visual hallucinations: Secondary | ICD-10-CM | POA: Insufficient documentation

## 2021-07-27 DIAGNOSIS — K7460 Unspecified cirrhosis of liver: Secondary | ICD-10-CM | POA: Diagnosis not present

## 2021-07-27 DIAGNOSIS — R440 Auditory hallucinations: Secondary | ICD-10-CM

## 2021-07-27 MED ORDER — EPLERENONE 50 MG PO TABS: 50.0000 mg | ORAL_TABLET | Freq: Every day | ORAL | 0 refills | Status: DC

## 2021-07-27 NOTE — Progress Notes (Signed)
Daniel Crawford, M.D. ?Gastroenterology & Hepatology ?[facility] ?[address] ?[place], [place] [address] ? ?Primary Care Physician: ?[name], MD ?[address] ?[place] [address] ? ?I will communicate my assessment and recommendations to the referring MD via EMR. ? ?Problems: ?NASH cirrhosis ? ?History of Present Illness: ?Daniel Crawford is a 66 y.o. male with PMH hypothyroidism, NASH cirrhosis c/b ascites and HE, and depression, who presents for evaluation of cirrhosis and colonic polyp which could not be removed. ? ?Most recent blood work-up from 06/26/2021 showed white blood cell count of 2.5, hemoglobin of 10.9, platelets 63,000, CMP with potassium 3.4, sodium 136, creatinine 1.25, BUN 18, AST 55, ALT 24, total bilirubin 2.0, albumin 3.0, INR 1.3 ? ?Patient reports that he has felt recurrent episodes of bloating and abdominal distention, worse in the RUQ. The patient denies having any nausea, vomiting, fever, chills, hematochezia, melena, hematemesis, diarrhea, jaundice, pruritus or weight loss. ? ?Patient reports he has been hearing voices recently which he does not know where is it coming from. Also sometimes he sees abnormal things crawling the wall.  He does not know why he is having these symptoms. ? ?States he drinks 3 L of water every day at least. ? ?Cirrhosis related questions: ?Hematemesis/coffee ground emesis: No ?History of variceal bleeding: No ?Abdominal pain: No ?Abdominal distention/worsening ascites Yes ?Fever/chills: No ?Episodes of confusion/disorientation: Yes, he states he gets forgetful sometimes , takes Xifaxan 550 mg BID due to lactulose intolerance ?Number of daily bowel movements:3-4 times a day ?Taking diuretics?: furosemide 60 mg qday, spironolactone 50 mg qday. Patient is taking potassium chloride 40 mEq tablets twice a day ?Date of last EGD:see below ?Prior history of banding?: No ?Prior episodes of SBP: No ?Last time  liver imaging was performed:[facility] liver 03/22/2021 - no large masses, has [place] scheduled on 03/22/2021 ?MELD score: 15 ? ?The patient has a history of [name] cirrhosis diagnosed since 2017 after he underwent a liver biopsy.  He had a thorough evaluation in the past which rule out viral hepatitis, autoimmune disorders or metabolic abnormalities leading to his liver dysfunction.  He is immune to hepatitis A.  He followed in [place] with Dr. Arta Silence, but requested to follow in our clinic more recently.  He was evaluated at [facility] in June 19, 2019 by Dr. Dorris Fetch.  The patient had a low meld score of 6 at that time.  He was not considered to be a candidate for liver transplant at this moment as he had a BMI of more than 40.  The patient reports that his disease has been complicated by episodes of ascites.  Never required a paracentesis in the past but has been on furosemide.  He has been taking lactulose for episodes of fogginess.  Denies any episodes of gastrointestinal bleeding in the past. ? ?Last EGD: 11/06/2019 ?- Normal esophagus. Dilated. ?- Portal hypertensive gastropathy. ?- Normal examined duodenum. ?- No specimens collected. ? ?Last Colonoscopy:06/28/2021 ? - One medium size sessile polyp in the cecum. This polyp could not be removed because of ?poor approach. ?- Five 4 to 7 mm polyps in the transverse colon and at the hepatic flexure, removed with a ?cold snare. Resected and retrieved. ?- External and internal hemorrhoids. ? ?Path: All polyps were tubular adenomas ? ?Past Medical History: ?Past Medical History:  ?Diagnosis Date  ? CKD (chronic kidney disease)   ? Depression   ? Diabetes mellitus without complication ([place])   ? Hypothyroidism   ?  Liver cirrhosis secondary to NASH [facility])   ? NASH (nonalcoholic steatohepatitis)   ? Spleen enlarged   ? Thrombocytopenia ([place])   ? ? ?Past Surgical History: ?Past Surgical History:  ?Procedure Laterality Date  ? Bilateral hernia surgery    ? 2006, 2007  ? CHOLECYSTECTOMY     ? 2016   ? COLONOSCOPY WITH PROPOFOL N/A 06/28/2021  ? Procedure: COLONOSCOPY WITH PROPOFOL;  Surgeon: Rogene Houston, MD;  [facility];  Service: Endoscopy;  Laterality: N/A;  1020  ? ESOPHAGEAL DILATION N/A 11/06/2019  ? Procedure: ESOPHAGEAL DILATION;  Surgeon: Montez Morita, Quillian Quince, MD;  [facility];  Service: Gastroenterology;  Laterality: N/A;  ? ESOPHAGOGASTRODUODENOSCOPY (EGD) WITH PROPOFOL N/A 11/06/2019  ? Procedure: ESOPHAGOGASTRODUODENOSCOPY (EGD) WITH PROPOFOL;  Surgeon: Harvel Quale, MD;  [facility];  Service: Gastroenterology;  Laterality: N/A;  1045  ? LAPAROSCOPIC ASSISTED VENTRAL HERNIA REPAIR    ? Polyp removed    ? in January of 2018.   ? POLYPECTOMY  06/28/2021  ? Procedure: POLYPECTOMY INTESTINAL;  Surgeon: Rogene Houston, MD;  [facility];  Service: Endoscopy;;  ? ? ?Family History: ?Family History  ?Problem Relation Age of Onset  ? Liver cancer Mother   ? Heart disease Mother   ? Aneurysm Sister   ? ? ?Social History: ?Social History  ? ?Tobacco Use  ?Smoking Status Former  ? Packs/day: 1.00  ? Years: 20.00  ? Pack years: 20.00  ? Types: Cigarettes  ? Quit date: 2017  ? Years since quitting: 6.3  ?Smokeless Tobacco Never  ? ?Social History  ? ?Substance and Sexual Activity  ?Alcohol Use No  ? ?Social History  ? ?Substance and Sexual Activity  ?Drug Use No  ? ? ?Allergies: ?No Known Allergies ? ?Medications: ?Current Outpatient Medications  ?Medication Sig Dispense Refill  ? acetaminophen (TYLENOL) 500 MG tablet Take 500-1,000 mg by mouth every 8 (eight) hours as needed for pain.    ? albuterol (VENTOLIN HFA) 108 (90 Base) MCG/ACT inhaler TAKE 2 PUFFS BY MOUTH EVERY 6 HOURS AS NEEDED FOR WHEEZE OR SHORTNESS OF BREATH (Patient taking differently: Inhale 2 puffs into the lungs every 6 (six) hours as needed for shortness of breath.) 8.5 each 3  ? carvedilol (COREG) 12.5 MG tablet Take 12.5 mg by mouth 2 (two) times daily with a  meal.    ? famotidine (PEPCID) 20 MG tablet Take 20 mg by mouth daily.    ? furosemide (LASIX) 40 MG tablet Take 1 tablet (40 mg total) by mouth daily. 30 tablet 0  ? gabapentin (NEURONTIN) 100 MG capsule Take 100 mg by mouth at bedtime as needed (pain).    ? hydrOXYzine (VISTARIL) 50 MG capsule Take 50 mg by mouth 2 (two) times daily as needed for anxiety.    ? levothyroxine (SYNTHROID) 175 MCG tablet Take 175 mcg by mouth daily before breakfast.    ? ondansetron (ZOFRAN) 8 MG tablet Take by mouth every 8 (eight) hours as needed for nausea or vomiting.    ? pantoprazole (PROTONIX) 40 MG tablet Take 40 mg by mouth daily.    ? potassium chloride SA (KLOR-CON M) 20 MEQ tablet Take 2 tablets (40 mEq total) by mouth 2 (two) times daily. 90 tablet 0  ? rifaximin (XIFAXAN) 550 MG TABS tablet Take 550 mg by mouth 2 (two) times daily.    ? sertraline (ZOLOFT) 50 MG tablet Take 50 mg by mouth at bedtime.    ?  spironolactone (ALDACTONE) 50 MG tablet Take 1 tablet (50 mg total) by mouth daily. 30 tablet 0  ? traZODone (DESYREL) 50 MG tablet Take 50 mg by mouth at bedtime.    ? ?No current facility-administered medications for this visit.  ? ? ?Review of Systems: ?GENERAL: negative for malaise, night sweats ?HEENT: No changes in hearing or vision, no nose bleeds or other nasal problems. ?NECK: Negative for lumps, goiter, pain and significant neck swelling ?RESPIRATORY: Negative for cough, wheezing ?CARDIOVASCULAR: Negative for chest pain, leg swelling, palpitations, orthopnea ?GI: SEE HPI ?MUSCULOSKELETAL: Negative for joint pain or swelling, back pain, and muscle pain. ?SKIN: Negative for lesions, rash ?PSYCH: Negative for sleep disturbance, mood disorder and recent psychosocial stressors. ?HEMATOLOGY Negative for prolonged bleeding, bruising easily, and swollen nodes. ?ENDOCRINE: Negative for cold or heat intolerance, polyuria, polydipsia and goiter. ?NEURO: negative for tremor, gait imbalance, syncope and seizures. ?The  remainder of the review of systems is noncontributory. ? ? ?Physical Exam: ?BP 123/72 (BP Location: Left Arm, Patient Position: Sitting, Cuff Size: Large)   Pulse 73   Temp 98.2 ?F (36.8 ?C) (Oral)   Ht 6' 2

## 2021-07-27 NOTE — Patient Instructions (Addendum)
-   Start epleronone 50 mg qday. Stop spironolactone and oral potassium once you start this new medicine ?- Continue furosemide 60 mg qday ?- Perform blood workup a week after starting the new medicine ?- Proceed with ultrasound ?- Schedule colonoscopy ?- Schedule CT head ?- Will refer to psychiatry once we have results CT head ?- If dry mouth, chew gum ?- Decrease water intake to <1.5 L per day ?- Reduce salt intake to <2 g per day ?- Can take Tylenol max of 2 g per day (650 mg q8h) for pain ?- Avoid NSAIDs for pain ?- Avoid eating raw oysters/shellfish ?- Protein shake (Ensure or Boost) every night before going to sleep ? ?

## 2021-07-31 ENCOUNTER — Ambulatory Visit (HOSPITAL_COMMUNITY)
Admission: RE | Admit: 2021-07-31 | Discharge: 2021-07-31 | Disposition: A | Discharge status: Home / Self Care | Payer: Medicare HMO | Source: Ambulatory Visit | Attending: Gastroenterology | Admitting: Gastroenterology

## 2021-07-31 DIAGNOSIS — Z9049 Acquired absence of other specified parts of digestive tract: Secondary | ICD-10-CM | POA: Diagnosis not present

## 2021-07-31 DIAGNOSIS — R7989 Other specified abnormal findings of blood chemistry: Secondary | ICD-10-CM | POA: Diagnosis not present

## 2021-07-31 DIAGNOSIS — K7469 Other cirrhosis of liver: Secondary | ICD-10-CM | POA: Diagnosis not present

## 2021-07-31 DIAGNOSIS — K7460 Unspecified cirrhosis of liver: Secondary | ICD-10-CM | POA: Diagnosis not present

## 2021-08-02 ENCOUNTER — Telehealth (INDEPENDENT_AMBULATORY_CARE_PROVIDER_SITE_OTHER): Payer: Self-pay

## 2021-08-02 ENCOUNTER — Encounter (INDEPENDENT_AMBULATORY_CARE_PROVIDER_SITE_OTHER): Payer: Self-pay

## 2021-08-02 ENCOUNTER — Other Ambulatory Visit (INDEPENDENT_AMBULATORY_CARE_PROVIDER_SITE_OTHER): Payer: Self-pay

## 2021-08-02 DIAGNOSIS — I10 Essential (primary) hypertension: Secondary | ICD-10-CM

## 2021-08-02 MED ORDER — PEG 3350-KCL-NA BICARB-NACL 420 G PO SOLR: 4000.0000 mL | ORAL | 0 refills | Status: DC

## 2021-08-02 NOTE — Telephone Encounter (Signed)
Dary Dilauro Ann Prisila Dlouhy, CMA  ?

## 2021-08-03 ENCOUNTER — Encounter (INDEPENDENT_AMBULATORY_CARE_PROVIDER_SITE_OTHER): Payer: Self-pay

## 2021-08-03 DIAGNOSIS — K7290 Hepatic failure, unspecified without coma: Secondary | ICD-10-CM | POA: Diagnosis not present

## 2021-08-03 DIAGNOSIS — E039 Hypothyroidism, unspecified: Secondary | ICD-10-CM | POA: Diagnosis not present

## 2021-08-03 DIAGNOSIS — E7220 Disorder of urea cycle metabolism, unspecified: Secondary | ICD-10-CM | POA: Diagnosis not present

## 2021-08-03 DIAGNOSIS — R410 Disorientation, unspecified: Secondary | ICD-10-CM | POA: Diagnosis not present

## 2021-08-03 DIAGNOSIS — R188 Other ascites: Secondary | ICD-10-CM | POA: Diagnosis not present

## 2021-08-03 DIAGNOSIS — N182 Chronic kidney disease, stage 2 (mild): Secondary | ICD-10-CM | POA: Diagnosis not present

## 2021-08-03 DIAGNOSIS — K7460 Unspecified cirrhosis of liver: Secondary | ICD-10-CM | POA: Diagnosis not present

## 2021-08-03 DIAGNOSIS — K7581 Nonalcoholic steatohepatitis (NASH): Secondary | ICD-10-CM | POA: Diagnosis not present

## 2021-08-03 DIAGNOSIS — E876 Hypokalemia: Secondary | ICD-10-CM | POA: Diagnosis not present

## 2021-08-03 DIAGNOSIS — R69 Illness, unspecified: Secondary | ICD-10-CM | POA: Diagnosis not present

## 2021-08-03 DIAGNOSIS — Z79899 Other long term (current) drug therapy: Secondary | ICD-10-CM | POA: Diagnosis not present

## 2021-08-03 DIAGNOSIS — R609 Edema, unspecified: Secondary | ICD-10-CM | POA: Diagnosis not present

## 2021-08-07 DIAGNOSIS — R7989 Other specified abnormal findings of blood chemistry: Secondary | ICD-10-CM | POA: Diagnosis not present

## 2021-08-07 DIAGNOSIS — K7460 Unspecified cirrhosis of liver: Secondary | ICD-10-CM | POA: Diagnosis not present

## 2021-08-07 DIAGNOSIS — E8770 Fluid overload, unspecified: Secondary | ICD-10-CM | POA: Diagnosis not present

## 2021-08-07 DIAGNOSIS — N179 Acute kidney failure, unspecified: Secondary | ICD-10-CM | POA: Diagnosis not present

## 2021-08-15 DIAGNOSIS — K7581 Nonalcoholic steatohepatitis (NASH): Secondary | ICD-10-CM | POA: Diagnosis not present

## 2021-08-15 DIAGNOSIS — K7460 Unspecified cirrhosis of liver: Secondary | ICD-10-CM | POA: Diagnosis not present

## 2021-08-16 ENCOUNTER — Encounter (INDEPENDENT_AMBULATORY_CARE_PROVIDER_SITE_OTHER): Payer: Self-pay | Admitting: Gastroenterology

## 2021-08-16 LAB — COMPREHENSIVE METABOLIC PANEL
AG Ratio: 1 (calc) (ref 1.0–2.5)
ALT: 20 U/L (ref 9–46)
AST: 50 U/L — ABNORMAL HIGH (ref 10–35)
Albumin: 3.4 g/dL — ABNORMAL LOW (ref 3.6–5.1)
Alkaline phosphatase (APISO): 83 U/L (ref 35–144)
BUN: 15 mg/dL (ref 7–25)
CO2: 32 mmol/L (ref 20–32)
Calcium: 8.9 mg/dL (ref 8.6–10.3)
Chloride: 102 mmol/L (ref 98–110)
Creat: 1.27 mg/dL (ref 0.70–1.35)
Globulin: 3.4 g/dL (calc) (ref 1.9–3.7)
Glucose, Bld: 110 mg/dL — ABNORMAL HIGH (ref 65–99)
Potassium: 4 mmol/L (ref 3.5–5.3)
Sodium: 139 mmol/L (ref 135–146)
Total Bilirubin: 2.5 mg/dL — ABNORMAL HIGH (ref 0.2–1.2)
Total Protein: 6.8 g/dL (ref 6.1–8.1)

## 2021-08-16 LAB — CBC WITH DIFFERENTIAL/PLATELET
Absolute Monocytes: 240 cells/uL (ref 200–950)
Basophils Absolute: 11 cells/uL (ref 0–200)
Basophils Relative: 0.4 %
Eosinophils Absolute: 170 cells/uL (ref 15–500)
Eosinophils Relative: 6.3 %
HCT: 35.1 % — ABNORMAL LOW (ref 38.5–50.0)
Hemoglobin: 11.8 g/dL — ABNORMAL LOW (ref 13.2–17.1)
Lymphs Abs: 778 cells/uL — ABNORMAL LOW (ref 850–3900)
MCH: 28.4 pg (ref 27.0–33.0)
MCHC: 33.6 g/dL (ref 32.0–36.0)
MCV: 84.4 fL (ref 80.0–100.0)
MPV: 9.8 fL (ref 7.5–12.5)
Monocytes Relative: 8.9 %
Neutro Abs: 1501 cells/uL (ref 1500–7800)
Neutrophils Relative %: 55.6 %
Platelets: 75 10*3/uL — ABNORMAL LOW (ref 140–400)
RBC: 4.16 10*6/uL — ABNORMAL LOW (ref 4.20–5.80)
RDW: 14.9 % (ref 11.0–15.0)
Total Lymphocyte: 28.8 %
WBC: 2.7 10*3/uL — ABNORMAL LOW (ref 3.8–10.8)

## 2021-08-16 LAB — AFP TUMOR MARKER: AFP-Tumor Marker: 9.5 ng/mL — ABNORMAL HIGH (ref <6.1)

## 2021-08-16 LAB — PROTIME-INR
INR: 1.2 — ABNORMAL HIGH
Prothrombin Time: 12.3 s — ABNORMAL HIGH (ref 9.0–11.5)

## 2021-08-17 ENCOUNTER — Telehealth (INDEPENDENT_AMBULATORY_CARE_PROVIDER_SITE_OTHER): Payer: Self-pay

## 2021-08-17 NOTE — Telephone Encounter (Signed)
Patient called wanting his test results from 08/15/2021.

## 2021-08-17 NOTE — Telephone Encounter (Signed)
I called the patient to discuss blood testing.

## 2021-08-22 ENCOUNTER — Ambulatory Visit (HOSPITAL_COMMUNITY)
Admission: RE | Admit: 2021-08-22 | Discharge: 2021-08-22 | Disposition: A | Discharge status: Home / Self Care | Payer: Medicare HMO | Source: Ambulatory Visit | Attending: Gastroenterology | Admitting: Gastroenterology

## 2021-08-22 DIAGNOSIS — R440 Auditory hallucinations: Secondary | ICD-10-CM | POA: Insufficient documentation

## 2021-08-22 DIAGNOSIS — R441 Visual hallucinations: Secondary | ICD-10-CM | POA: Diagnosis not present

## 2021-08-22 DIAGNOSIS — F29 Unspecified psychosis not due to a substance or known physiological condition: Secondary | ICD-10-CM | POA: Diagnosis not present

## 2021-08-22 DIAGNOSIS — R69 Illness, unspecified: Secondary | ICD-10-CM | POA: Diagnosis not present

## 2021-08-23 DIAGNOSIS — E876 Hypokalemia: Secondary | ICD-10-CM | POA: Diagnosis not present

## 2021-08-23 DIAGNOSIS — E039 Hypothyroidism, unspecified: Secondary | ICD-10-CM | POA: Diagnosis not present

## 2021-08-24 ENCOUNTER — Telehealth (INDEPENDENT_AMBULATORY_CARE_PROVIDER_SITE_OTHER): Payer: Self-pay

## 2021-08-24 ENCOUNTER — Telehealth (INDEPENDENT_AMBULATORY_CARE_PROVIDER_SITE_OTHER): Payer: Self-pay | Admitting: *Deleted

## 2021-08-24 NOTE — Telephone Encounter (Signed)
Patient would like to know why he is being referred to Psych and Neuro. He says he thought the scan was ok. Please advise.

## 2021-08-24 NOTE — Telephone Encounter (Signed)
The scan was normal, but he was presenting symptoms so we will refer him to both psychiatry and neurology.

## 2021-08-24 NOTE — Telephone Encounter (Signed)
Thanks for the update, it's weird [facility] do not take care of patients with hallucinations

## 2021-08-24 NOTE — Telephone Encounter (Signed)
[facility] do not treat hallucinations, the referral coordinator did give me the name of 2 places to reach out to [employer]). I have reached out to [facility] and gave them patient's information, they will call patient to see if they have services to help patient, if not they will have patient calls [place] and let us know

## 2021-08-25 NOTE — Telephone Encounter (Signed)
That's correct, that is the same reason I ordered the CT scan. He will be better served by a specialist that takes care of these disorders.

## 2021-08-25 NOTE — Telephone Encounter (Signed)
Referrals have been placed.  

## 2021-08-25 NOTE — Telephone Encounter (Signed)
Patient made aware his visual and auditory hallucinations as being the cause. Patient states he should not have mentioned him seeing things flying across the room as now people with think he is crazy. He asked what does the Doctor think this is . I advised that no one thinks he is crazy,but Dr. Jenetta Downer does not know what is causing his symptoms this is why he is referring him to someone whom can possibly help find out. Patient states understanding.

## 2021-08-31 NOTE — Patient Instructions (Signed)
Hank Chamorro  08/31/2021     '@PREFPERIOPPHARMACY'$ @   Your procedure is scheduled on  09/05/2021.   Report to [facility] at  1100  A.M.   Call this number if you have problems the morning of surgery:  [phone]   Remember:  Follow the diet and prep instructions given to you by the office.    Use your inhaler before you come and bring your rescue inhaler with you.    Take these medicines the morning of surgery with A SIP OF WATER       carvedilol, pepcid, hydroxyzine, levothyroxine, zofran (if needed), protonix, zoloft.    Do not wear jewelry, make-up or nail polish.  Do not wear lotions, powders, or perfumes, or deodorant.  Do not shave 48 hours prior to surgery.  Men Jaquith shave face and neck.  Do not bring valuables to the hospital.  [facility] is not responsible for any belongings or valuables.  Contacts, dentures or bridgework Canino not be worn into surgery.  Leave your suitcase in the car.  After surgery it Dipiero be brought to your room.  For patients admitted to the hospital, discharge time will be determined by your treatment team.  Patients discharged the day of surgery will not be allowed to drive home and must have someone with them for 24 hours.    Special instructions:   DO NOT smoke tobacco or vape for 24 hours before your procedure.  Please read over the following fact sheets that you were given. Anesthesia Post-op Instructions and Care and Recovery After Surgery      Colonoscopy, Adult, Care After [name]. Your health care provider Keirsey also give you more specific instructions. If you have problems or questions, contact your health care provider. What can I expect after the procedure? After the procedure, it is common to have: A small amount of blood in your stool for 24 hours after the procedure. Some gas. Mild cramping or bloating of your abdomen. Follow these  instructions at home: Eating and drinking  Drink enough fluid to keep your urine pale yellow. Follow instructions from your health care provider about eating or drinking restrictions. Resume your normal diet as told by your health care provider. Avoid heavy or fried foods that are hard to digest. Activity Rest as told by your health care provider. Avoid sitting for a long time without moving. Get up to take short walks every 1-2 hours. This is important to improve blood flow and breathing. Ask for help if you feel weak or unsteady. Return to your normal activities as told by your health care provider. Ask your health care provider what activities are safe for you. Managing cramping and bloating  Try walking around when you have cramps or feel bloated. If directed, apply heat to your abdomen as told by your health care provider. Use the heat source that your health care provider recommends, such as a moist heat pack or a heating pad. Place a towel between your skin and the heat source. Leave the heat on for 20-30 minutes. Remove the heat if your skin turns bright red. This is especially important if you are unable to feel pain, heat, or cold. You have a greater risk of getting burned. General instructions If you were given a sedative during the procedure, it can affect you for several hours. Do not drive or operate machinery until your  health care provider says that it is safe. For the first 24 hours after the procedure: Do not sign important documents. Do not drink alcohol. Do your regular daily activities at a slower pace than normal. Eat soft foods that are easy to digest. Take over-the-counter and prescription medicines only as told by your health care provider. Keep all follow-up visits. This is important. Contact a health care provider if: You have blood in your stool 2-3 days after the procedure. Get help right away if: You have more than a small spotting of blood in your  stool. You have large blood clots in your stool. You have swelling of your abdomen. You have nausea or vomiting. You have a fever. You have increasing pain in your abdomen that is not relieved with medicine. These symptoms [place] be an emergency. Get help right away. Call 911. Do not wait to see if the symptoms will go away. Do not drive yourself to the hospital. Summary After the procedure, it is common to have a small amount of blood in your stool. You Fromme also have mild cramping and bloating of your abdomen. If you were given a sedative during the procedure, it can affect you for several hours. Do not drive or operate machinery until your health care provider says that it is safe. Get help right away if you have a lot of blood in your stool, nausea or vomiting, a fever, or increased pain in your abdomen. This information is not intended to replace advice given to you by your health care provider. Make sure you discuss any questions you have with your health care provider. Document Revised: 11/02/2020 Document Reviewed: 11/02/2020 Elsevier Patient Education  [place] After This sheet gives you information about how to care for yourself after your procedure. Your health care provider Fadely also give you more specific instructions. If you have problems or questions, contact your health care provider. What can I expect after the procedure? After the procedure, it is common to have: Tiredness. Forgetfulness about what happened after the procedure. Impaired judgment for important decisions. Nausea or vomiting. Some difficulty with balance. Follow these instructions at home: For the time period you were told by your health care provider:     Rest as needed. Do not participate in activities where you could fall or become injured. Do not drive or use machinery. Do not drink alcohol. Do not take sleeping pills or medicines that cause drowsiness. Do  not make important decisions or sign legal documents. Do not take care of children on your own. Eating and drinking Follow the diet that is recommended by your health care provider. Drink enough fluid to keep your urine pale yellow. If you vomit: Drink water, juice, or soup when you can drink without vomiting. Make sure you have little or no nausea before eating solid foods. General instructions Have a responsible adult stay with you for the time you are told. It is important to have someone help care for you until you are awake and alert. Take over-the-counter and prescription medicines only as told by your health care provider. If you have sleep apnea, surgery and certain medicines can increase your risk for breathing problems. Follow instructions from your health care provider about wearing your sleep device: Anytime you are sleeping, including during daytime naps. While taking prescription pain medicines, sleeping medicines, or medicines that make you drowsy. Avoid smoking. Keep all follow-up visits as told by your health care provider. This is  important. Contact a health care provider if: You keep feeling nauseous or you keep vomiting. You feel light-headed. You are still sleepy or having trouble with balance after 24 hours. You develop a rash. You have a fever. You have redness or swelling around the IV site. Get help right away if: You have trouble breathing. You have new-onset confusion at home. Summary For several hours after your procedure, you Mo[place] feel tired. You Stroh also be forgetful and have poor judgment. Have a responsible adult stay with you for the time you are told. It is important to have someone help care for you until you are awake and alert. Rest as told. Do not drive or operate machinery. Do not drink alcohol or take sleeping pills. Get help right away if you have trouble breathing, or if you suddenly become confused. This information is not intended to replace  advice given to you by your health care provider. Make sure you discuss any questions you have with your health care provider. Document Revised: 02/14/2021 Document Reviewed: 02/12/2019 Elsevier Patient Education  [place].

## 2021-09-01 ENCOUNTER — Encounter (HOSPITAL_COMMUNITY)
Admission: RE | Admit: 2021-09-01 | Discharge: 2021-09-01 | Disposition: A | Discharge status: Home / Self Care | Payer: Medicare HMO | Source: Ambulatory Visit | Attending: Gastroenterology | Admitting: Gastroenterology

## 2021-09-01 ENCOUNTER — Encounter (HOSPITAL_COMMUNITY): Payer: Self-pay

## 2021-09-01 DIAGNOSIS — Z01812 Encounter for preprocedural laboratory examination: Secondary | ICD-10-CM | POA: Diagnosis not present

## 2021-09-01 DIAGNOSIS — I10 Essential (primary) hypertension: Secondary | ICD-10-CM | POA: Diagnosis not present

## 2021-09-01 HISTORY — DX: Prediabetes: R73.03

## 2021-09-01 LAB — BASIC METABOLIC PANEL
Anion gap: 4 — ABNORMAL LOW (ref 5–15)
BUN: 18 mg/dL (ref 8–23)
CO2: 32 mmol/L (ref 22–32)
Calcium: 8.9 mg/dL (ref 8.9–10.3)
Chloride: 100 mmol/L (ref 98–111)
Creatinine, Ser: 1.42 mg/dL — ABNORMAL HIGH (ref 0.61–1.24)
GFR, Estimated: 56 mL/min — ABNORMAL LOW (ref >60)
Glucose, Bld: 108 mg/dL — ABNORMAL HIGH (ref 70–99)
Potassium: 3.8 mmol/L (ref 3.5–5.1)
Sodium: 136 mmol/L (ref 135–145)

## 2021-09-01 NOTE — Progress Notes (Signed)
   09/01/21 1304  OBSTRUCTIVE SLEEP APNEA  Have you ever been diagnosed with sleep apnea through a sleep study? No  Do you snore loudly (loud enough to be heard through closed doors)?  1  Do you often feel tired, fatigued, or sleepy during the daytime (such as falling asleep during driving or talking to someone)? 0  Has anyone observed you stop breathing during your sleep? 0  Do you have, or are you being treated for high blood pressure? 1  BMI more than 35 kg/m2? 1  Age > 50 (1-yes) 1  Neck circumference greater than:Male 16 inches or larger, Male 17inches or larger? 0  Male Gender (Yes=1) 1  Obstructive Sleep Apnea Score 5  Score 5 or greater  Results sent to PCP

## 2021-09-04 ENCOUNTER — Ambulatory Visit: Payer: Medicare HMO | Admitting: Nutrition

## 2021-09-07 DIAGNOSIS — K7290 Hepatic failure, unspecified without coma: Secondary | ICD-10-CM | POA: Diagnosis not present

## 2021-09-07 DIAGNOSIS — Z7182 Exercise counseling: Secondary | ICD-10-CM | POA: Diagnosis not present

## 2021-09-07 DIAGNOSIS — E039 Hypothyroidism, unspecified: Secondary | ICD-10-CM | POA: Diagnosis not present

## 2021-09-07 DIAGNOSIS — Z79899 Other long term (current) drug therapy: Secondary | ICD-10-CM | POA: Diagnosis not present

## 2021-09-07 DIAGNOSIS — K769 Liver disease, unspecified: Secondary | ICD-10-CM | POA: Diagnosis not present

## 2021-09-07 DIAGNOSIS — R609 Edema, unspecified: Secondary | ICD-10-CM | POA: Diagnosis not present

## 2021-09-07 DIAGNOSIS — Z6841 Body Mass Index (BMI) 40.0 and over, adult: Secondary | ICD-10-CM | POA: Diagnosis not present

## 2021-09-07 DIAGNOSIS — E7220 Disorder of urea cycle metabolism, unspecified: Secondary | ICD-10-CM | POA: Diagnosis not present

## 2021-09-07 DIAGNOSIS — K7581 Nonalcoholic steatohepatitis (NASH): Secondary | ICD-10-CM | POA: Diagnosis not present

## 2021-09-07 DIAGNOSIS — K7460 Unspecified cirrhosis of liver: Secondary | ICD-10-CM | POA: Diagnosis not present

## 2021-09-07 DIAGNOSIS — R69 Illness, unspecified: Secondary | ICD-10-CM | POA: Diagnosis not present

## 2021-09-07 DIAGNOSIS — N182 Chronic kidney disease, stage 2 (mild): Secondary | ICD-10-CM | POA: Diagnosis not present

## 2021-09-07 DIAGNOSIS — Z713 Dietary counseling and surveillance: Secondary | ICD-10-CM | POA: Diagnosis not present

## 2021-09-13 ENCOUNTER — Encounter (INDEPENDENT_AMBULATORY_CARE_PROVIDER_SITE_OTHER): Payer: Self-pay

## 2021-09-18 ENCOUNTER — Ambulatory Visit (INDEPENDENT_AMBULATORY_CARE_PROVIDER_SITE_OTHER): Payer: Medicare HMO | Admitting: Gastroenterology

## 2021-09-20 ENCOUNTER — Telehealth: Payer: Self-pay

## 2021-09-20 ENCOUNTER — Emergency Department (HOSPITAL_COMMUNITY)
Admission: EM | Admit: 2021-09-20 | Discharge: 2021-09-20 | Disposition: A | Discharge status: Home / Self Care | Payer: Medicare HMO | Attending: Emergency Medicine | Admitting: Emergency Medicine

## 2021-09-20 ENCOUNTER — Other Ambulatory Visit: Payer: Self-pay

## 2021-09-20 ENCOUNTER — Encounter (HOSPITAL_COMMUNITY): Payer: Self-pay | Admitting: Emergency Medicine

## 2021-09-20 ENCOUNTER — Emergency Department (HOSPITAL_COMMUNITY): Payer: Medicare HMO

## 2021-09-20 DIAGNOSIS — E876 Hypokalemia: Secondary | ICD-10-CM | POA: Insufficient documentation

## 2021-09-20 DIAGNOSIS — E039 Hypothyroidism, unspecified: Secondary | ICD-10-CM | POA: Insufficient documentation

## 2021-09-20 DIAGNOSIS — R7401 Elevation of levels of liver transaminase levels: Secondary | ICD-10-CM | POA: Diagnosis not present

## 2021-09-20 DIAGNOSIS — R161 Splenomegaly, not elsewhere classified: Secondary | ICD-10-CM | POA: Diagnosis not present

## 2021-09-20 DIAGNOSIS — R531 Weakness: Secondary | ICD-10-CM | POA: Diagnosis not present

## 2021-09-20 DIAGNOSIS — Z79899 Other long term (current) drug therapy: Secondary | ICD-10-CM | POA: Diagnosis not present

## 2021-09-20 DIAGNOSIS — K766 Portal hypertension: Secondary | ICD-10-CM | POA: Diagnosis not present

## 2021-09-20 DIAGNOSIS — R5382 Chronic fatigue, unspecified: Secondary | ICD-10-CM

## 2021-09-20 DIAGNOSIS — E1122 Type 2 diabetes mellitus with diabetic chronic kidney disease: Secondary | ICD-10-CM | POA: Insufficient documentation

## 2021-09-20 DIAGNOSIS — N189 Chronic kidney disease, unspecified: Secondary | ICD-10-CM | POA: Insufficient documentation

## 2021-09-20 DIAGNOSIS — R600 Localized edema: Secondary | ICD-10-CM | POA: Diagnosis not present

## 2021-09-20 DIAGNOSIS — R109 Unspecified abdominal pain: Secondary | ICD-10-CM | POA: Diagnosis not present

## 2021-09-20 DIAGNOSIS — R21 Rash and other nonspecific skin eruption: Secondary | ICD-10-CM | POA: Insufficient documentation

## 2021-09-20 DIAGNOSIS — Z9049 Acquired absence of other specified parts of digestive tract: Secondary | ICD-10-CM | POA: Diagnosis not present

## 2021-09-20 LAB — URINALYSIS, ROUTINE W REFLEX MICROSCOPIC
Bilirubin Urine: NEGATIVE
Glucose, UA: NEGATIVE mg/dL
Hgb urine dipstick: NEGATIVE
Ketones, ur: NEGATIVE mg/dL
Leukocytes,Ua: NEGATIVE
Nitrite: NEGATIVE
Protein, ur: NEGATIVE mg/dL
Specific Gravity, Urine: 1.015 (ref 1.005–1.030)
pH: 6 (ref 5.0–8.0)

## 2021-09-20 LAB — CBC WITH DIFFERENTIAL/PLATELET
Abs Immature Granulocytes: 0.02 10*3/uL (ref 0.00–0.07)
Basophils Absolute: 0 10*3/uL (ref 0.0–0.1)
Basophils Relative: 1 %
Eosinophils Absolute: 0.2 10*3/uL (ref 0.0–0.5)
Eosinophils Relative: 6 %
HCT: 31.4 % — ABNORMAL LOW (ref 39.0–52.0)
Hemoglobin: 10.5 g/dL — ABNORMAL LOW (ref 13.0–17.0)
Immature Granulocytes: 1 %
Lymphocytes Relative: 27 %
Lymphs Abs: 0.8 10*3/uL (ref 0.7–4.0)
MCH: 28.1 pg (ref 26.0–34.0)
MCHC: 33.4 g/dL (ref 30.0–36.0)
MCV: 84 fL (ref 80.0–100.0)
Monocytes Absolute: 0.3 10*3/uL (ref 0.1–1.0)
Monocytes Relative: 11 %
Neutro Abs: 1.6 10*3/uL — ABNORMAL LOW (ref 1.7–7.7)
Neutrophils Relative %: 54 %
Platelets: 62 10*3/uL — ABNORMAL LOW (ref 150–400)
RBC: 3.74 MIL/uL — ABNORMAL LOW (ref 4.22–5.81)
RDW: 16.4 % — ABNORMAL HIGH (ref 11.5–15.5)
WBC: 2.9 10*3/uL — ABNORMAL LOW (ref 4.0–10.5)
nRBC: 0 % (ref 0.0–0.2)

## 2021-09-20 LAB — COMPREHENSIVE METABOLIC PANEL
ALT: 24 U/L (ref 0–44)
AST: 54 U/L — ABNORMAL HIGH (ref 15–41)
Albumin: 2.9 g/dL — ABNORMAL LOW (ref 3.5–5.0)
Alkaline Phosphatase: 59 U/L (ref 38–126)
Anion gap: 9 (ref 5–15)
BUN: 23 mg/dL (ref 8–23)
CO2: 31 mmol/L (ref 22–32)
Calcium: 8.3 mg/dL — ABNORMAL LOW (ref 8.9–10.3)
Chloride: 94 mmol/L — ABNORMAL LOW (ref 98–111)
Creatinine, Ser: 1.45 mg/dL — ABNORMAL HIGH (ref 0.61–1.24)
GFR, Estimated: 54 mL/min — ABNORMAL LOW (ref >60)
Glucose, Bld: 130 mg/dL — ABNORMAL HIGH (ref 70–99)
Potassium: 2.7 mmol/L — CL (ref 3.5–5.1)
Sodium: 134 mmol/L — ABNORMAL LOW (ref 135–145)
Total Bilirubin: 3.3 mg/dL — ABNORMAL HIGH (ref 0.3–1.2)
Total Protein: 6.6 g/dL (ref 6.5–8.1)

## 2021-09-20 LAB — TROPONIN I (HIGH SENSITIVITY): Troponin I (High Sensitivity): 6 ng/L (ref <18)

## 2021-09-20 LAB — TSH: TSH: 1.209 u[IU]/mL (ref 0.350–4.500)

## 2021-09-20 LAB — CBG MONITORING, ED: Glucose-Capillary: 146 mg/dL — ABNORMAL HIGH (ref 70–99)

## 2021-09-20 LAB — MAGNESIUM: Magnesium: 2.1 mg/dL (ref 1.7–2.4)

## 2021-09-20 LAB — AMMONIA: Ammonia: 36 umol/L — ABNORMAL HIGH (ref 9–35)

## 2021-09-20 LAB — LIPASE, BLOOD: Lipase: 59 U/L — ABNORMAL HIGH (ref 11–51)

## 2021-09-20 MED ORDER — POTASSIUM CHLORIDE CRYS ER 20 MEQ PO TBCR
20.0000 meq | EXTENDED_RELEASE_TABLET | Freq: Two times a day (BID) | ORAL | 0 refills | Status: DC
Start: 2021-09-20 — End: 2022-01-25

## 2021-09-20 MED ORDER — POTASSIUM CHLORIDE 10 MEQ/100ML IV SOLN
10.0000 meq | Freq: Once | INTRAVENOUS | Status: AC
  Administered 2021-09-20: 10 meq via INTRAVENOUS
  Filled 2021-09-20: qty 100

## 2021-09-20 MED ORDER — POTASSIUM CHLORIDE CRYS ER 20 MEQ PO TBCR
20.0000 meq | EXTENDED_RELEASE_TABLET | Freq: Once | ORAL | Status: AC
  Administered 2021-09-20: 20 meq via ORAL
  Filled 2021-09-20: qty 1

## 2021-09-20 MED ORDER — IOHEXOL 300 MG/ML  SOLN
100.0000 mL | Freq: Once | INTRAMUSCULAR | Status: AC | PRN
  Administered 2021-09-20: 100 mL via INTRAVENOUS

## 2021-09-20 MED ORDER — SODIUM CHLORIDE 0.9 % IV BOLUS
500.0000 mL | Freq: Once | INTRAVENOUS | Status: AC
  Administered 2021-09-20: 500 mL via INTRAVENOUS

## 2021-09-20 NOTE — Discharge Instructions (Addendum)
We are letting you go home today but you will need to continue treating your low potassium level.  I recommend using the potassium tablets you have at home taking 20 mEq morning and evening for the next 7 days.  I have prescribed you some of these tablets in case what you have at home is not the correct dosing.  Plan to see your primary doctor in a week for repeat potassium level to make sure it is at a better level.  Also as discussed, Dr. Jenetta Downer is ordering a cortisol blood test which needs to be drawn first thing in the morning before you have had anything to eat or drink.  His office will be contacting you with instructions on having this test collected.

## 2021-09-20 NOTE — ED Notes (Signed)
Pt ambulated to bathroom by himself. Pt tolerated well

## 2021-09-20 NOTE — Telephone Encounter (Signed)
Thanks

## 2021-09-20 NOTE — Telephone Encounter (Signed)
Order faxed to [facility].

## 2021-09-20 NOTE — Telephone Encounter (Signed)
Fasting Cortisol level on 09/21/2021 at [place], per Dr. [name]. Patient aware.

## 2021-09-20 NOTE — ED Triage Notes (Signed)
Pt to the ED with complaints of weakness and abnormal labs. Pt states he has been feeling bad since the weekend. Pt states his WBC are low and his Ammonia levels are high.  Pt states he has stage 4 Liver disease.

## 2021-09-20 NOTE — ED Provider Notes (Signed)
[facility] EMERGENCY DEPARTMENT Provider Note   CSN: [id number] Arrival date & time: 09/20/21  0920     History  Chief Complaint  Patient presents with   Weakness    Daniel Crawford is a 66 y.o. male with a history including Daniel Crawford induced liver cirrhosis, stage IV, history of ascites, history of elevated ammonia level, hypothyroidism, diabetes, chronic kidney disease presenting for evaluation of generalized weakness, fatigue and altered mental status.  He denies confusion but both he and friend at bedside endorse he has had difficulty staying awake, patient stated he slept most of the day Saturday which was 5 days ago, does not remember Sunday, woke Monday still groggy but was able to function better and then slept most of yesterday as well.  He denies fevers or chills, shortness of breath, cough, abdominal distention, he does report reduced p.o. intake and reduced appetite.  He does get abdominal ascites, he has never had to have fluid drained, he denies shortness of breath but does state he had some chest pain yesterday that lasted for several hours then resolved.  He is on Xifaxan to help control his ammonia level.  He denies urinary complaints, diarrhea.  He reports his last ammonia level approximately 3 weeks ago was in the mid 70 range.  The history is provided by the patient and a friend.       Home Medications Prior to Admission medications   Medication Sig Start Date End Date Taking? Authorizing Provider  acetaminophen (TYLENOL) 500 MG tablet Take 1,000 mg by mouth every 6 (six) hours as needed for moderate pain.   Yes [provider]  albuterol (VENTOLIN HFA) 108 (90 Base) MCG/ACT inhaler TAKE 2 PUFFS BY MOUTH EVERY 6 HOURS AS NEEDED FOR WHEEZE OR SHORTNESS OF BREATH Patient taking differently: Inhale 1-2 puffs into the lungs every 6 (six) hours as needed for shortness of breath or wheezing. 12/28/20  Yes Rigoberto Noel, MD  carvedilol (COREG) 12.5 MG tablet Take 12.5 mg by  mouth 2 (two) times daily with a meal.   Yes [provider]  furosemide (LASIX) 20 MG tablet Take 20 mg by mouth daily.   Yes [provider]  furosemide (LASIX) 40 MG tablet Take 1 tablet (40 mg total) by mouth daily. 04/23/21  Yes Vann, Jessica U, DO  gabapentin (NEURONTIN) 100 MG capsule Take 100 mg by mouth at bedtime. 02/20/21  Yes [provider]  hydrOXYzine (VISTARIL) 50 MG capsule Take 50 mg by mouth in the morning and at bedtime.   Yes [provider]  levothyroxine (SYNTHROID) 175 MCG tablet Take 175 mcg by mouth daily before breakfast.   Yes [provider]  ondansetron (ZOFRAN) 8 MG tablet Take 8 mg by mouth every 8 (eight) hours as needed for nausea or vomiting.   Yes [provider]  pantoprazole (PROTONIX) 40 MG tablet Take 40 mg by mouth daily.   Yes [provider]  potassium chloride SA (KLOR-CON M) 20 MEQ tablet Take 1 tablet (20 mEq total) by mouth 2 (two) times daily. 09/20/21  Yes Elia Nunley, Almyra Free, PA-C  rifaximin (XIFAXAN) 550 MG TABS tablet Take 550 mg by mouth 2 (two) times daily.   Yes [provider]  sertraline (ZOLOFT) 50 MG tablet Take 50 mg by mouth at bedtime.   Yes [provider]  traZODone (DESYREL) 50 MG tablet Take 50 mg by mouth at bedtime.   Yes [provider]  eplerenone (INSPRA) 50 MG tablet Take 1  tablet (50 mg total) by mouth daily. 07/27/21   Harvel Quale, MD      Allergies    Patient has no known allergies.    Review of Systems   Review of Systems  Constitutional:  Positive for appetite change and fatigue.  HENT: Negative.    Cardiovascular:  Positive for chest pain and leg swelling. Negative for palpitations.  Gastrointestinal:  Positive for abdominal distention. Negative for diarrhea, nausea and vomiting.  Skin:  Positive for rash.       Reports had a rash several days ago left upper thigh, improved.  Neurological:  Positive for weakness.     Physical Exam Updated Vital Signs BP 125/73   Pulse 64   Temp 97.9 F (36.6 C) (Oral)   Resp 18   Ht '6\' 2"'$  (1.88 m)   Wt (!) 168.3 kg   SpO2 95%   BMI 47.63 kg/m  Physical Exam Vitals and nursing note reviewed.  Constitutional:      Appearance: He is well-developed.  HENT:     Head: Normocephalic and atraumatic.     Mouth/Throat:     Mouth: Mucous membranes are dry.  Eyes:     General: Scleral icterus present.  Cardiovascular:     Rate and Rhythm: Normal rate and regular rhythm.     Heart sounds: Normal heart sounds.  Pulmonary:     Effort: Pulmonary effort is normal.     Breath sounds: Normal breath sounds. No wheezing, rhonchi or rales.  Abdominal:     General: Bowel sounds are normal. There is distension.     Palpations: Abdomen is soft. There is no fluid wave.     Tenderness: There is abdominal tenderness in the right lower quadrant and left lower quadrant.     Comments: No obvious fluid wave appreciated.  Musculoskeletal:        General: Normal range of motion.     Cervical back: Normal range of motion.     Right lower leg: Edema present.     Left lower leg: Edema present.  Skin:    General: Skin is warm and dry.     Findings: Rash present.     Comments: Mild erythematous rash left upper anterior thigh.  Neurological:     Mental Status: He is alert.     ED Results / Procedures / Treatments   Labs (all labs ordered are listed, but only abnormal results are displayed) Labs Reviewed  COMPREHENSIVE METABOLIC PANEL - Abnormal; Notable for the following components:      Result Value   Sodium 134 (*)    Potassium 2.7 (*)    Chloride 94 (*)    Glucose, Bld 130 (*)    Creatinine, Ser 1.45 (*)    Calcium 8.3 (*)    Albumin 2.9 (*)    AST 54 (*)    Total Bilirubin 3.3 (*)    GFR, Estimated 54 (*)    All other components within normal limits  LIPASE, BLOOD - Abnormal; Notable for the following components:   Lipase 59 (*)    All other components within  normal limits  AMMONIA - Abnormal; Notable for the following components:   Ammonia 36 (*)    All other components within normal limits  CBC WITH DIFFERENTIAL/PLATELET - Abnormal; Notable for the following components:   WBC 2.9 (*)    RBC 3.74 (*)    Hemoglobin 10.5 (*)    HCT 31.4 (*)    RDW 16.4 (*)  Platelets 62 (*)    Neutro Abs 1.6 (*)    All other components within normal limits  CBG MONITORING, ED - Abnormal; Notable for the following components:   Glucose-Capillary 146 (*)    All other components within normal limits  URINALYSIS, ROUTINE W REFLEX MICROSCOPIC  MAGNESIUM  TSH  CBG MONITORING, ED  TROPONIN I (HIGH SENSITIVITY)    EKG None  Radiology CT ABDOMEN PELVIS W CONTRAST  Result Date: 09/20/2021 CLINICAL DATA:  Left lower quadrant pain, history of cirrhosis EXAM: CT ABDOMEN AND PELVIS WITH CONTRAST TECHNIQUE: Multidetector CT imaging of the abdomen and pelvis was performed using the standard protocol following bolus administration of intravenous contrast. RADIATION DOSE REDUCTION: This exam was performed according to the departmental dose-optimization program which includes automated exposure control, adjustment of the mA and/or kV according to patient size and/or use of iterative reconstruction technique. CONTRAST:  128m OMNIPAQUE IOHEXOL 300 MG/ML  SOLN COMPARISON:  CT abdomen and pelvis dated April 18, 2021 FINDINGS: Lower chest: No acute abnormality.  Small hiatal hernia. Hepatobiliary: Cirrhotic liver morphology. No suspicious liver lesions. Gallbladder is surgically absent. No biliary ductal dilation. Pancreas: No pancreatic ductal dilation. Surrounding fat stranding is similar to prior and likely due to mesenteric edema. Spleen: Unchanged splenomegaly. Adrenals/Urinary Tract: Bilateral adrenal glands are unremarkable. No hydronephrosis. Punctate nonobstructing right renal stone. Bladder is unremarkable. Stomach/Bowel: Stomach is within normal limits. Appendix  appears normal. No evidence of bowel wall thickening, distention, or inflammatory changes. Vascular/Lymphatic: Upper abdominal varices, including paraesophageal and splenorenal. No enlarged abdominal or pelvic lymph nodes. Reproductive: Prostate is unremarkable. Other: Moderate fat containing umbilical hernia, unchanged when compared with prior exam. Small fat containing left inguinal hernia. Diffuse mesenteric edema with no overt ascites. Musculoskeletal: No acute or significant osseous findings. IMPRESSION: 1. No acute findings in the abdomen or pelvis. 2. Cirrhotic liver morphology with sequela of portal hypertension including splenomegaly and upper abdominal varices. 3. Nonobstructing right renal stone. Electronically Signed   By: LYetta GlassmanM.D.   On: 09/20/2021 13:30   DG ABD ACUTE 2+V W 1V CHEST  Result Date: 09/20/2021 CLINICAL DATA:  66year old male with abdominal pain. EXAM: DG ABDOMEN ACUTE WITH 1 VIEW CHEST COMPARISON:  01/20/2020 FINDINGS: There is no evidence of dilated bowel loops or free intraperitoneal air. No radiopaque calculi or other significant radiographic abnormality is seen. Cholecystectomy clips in the right upper quadrant. Heart size and mediastinal contours are within normal limits. Both lungs are clear. IMPRESSION: Negative abdominal radiographs.  No acute cardiopulmonary disease. Electronically Signed   By: DRuthann CancerM.D.   On: 09/20/2021 11:59    Procedures Procedures    Medications Ordered in ED Medications  potassium chloride 10 mEq in 100 mL IVPB (10 mEq Intravenous New Bag/Given 09/20/21 1618)  potassium chloride SA (KLOR-CON M) CR tablet 20 mEq (has no administration in time range)  sodium chloride 0.9 % bolus 500 mL (0 mLs Intravenous Stopped 09/20/21 1416)  potassium chloride 10 mEq in 100 mL IVPB (0 mEq Intravenous Stopped 09/20/21 1416)  iohexol (OMNIPAQUE) 300 MG/ML solution 100 mL (100 mLs Intravenous Contrast Given 09/20/21 1300)    ED Course/  Medical Decision Making/ A&P                           Medical Decision Making Patient presenting with generalized weakness and fatigue, increased drowsiness but no overt confusion, also no nausea or vomiting, no reduced p.o. intake.  Afebrile.  History of cirrhosis and hepatic encephalopathy although today's ammonia level is actually reduced from prior testing.  He remains alert during his ED stay.  He did have some low abdominal pain bilaterally so CT imaging was performed, negative acute including no significant ascites.  He does have a significant hypokalemia at 2.7.  He was given 2 rounds of potassium 10 mEq while here along with a p.o. dose.  He has no confusion here and was able to eat [employer] chicken which his friend brought in without difficulty.  Amount and/or Complexity of Data Reviewed Labs: ordered.    Details: Per above.  Although he does have significant electrolyte abnormalities everything is stable except for his potassium of 2.7.  He is on a potassium sparing diuretic but is also on Lasix.  This was replenished today.  His creatinine is 1.45, stable, AST slight elevation at 54 bilirubin 3.3, lipase is 59, his ammonia is 36 today, he states it was over 73 weeks ago.  Other abnormalities include a WBC count of 2.9 and a platelet of 62, these numbers are also stable and in line with prior testing.  His hemoglobin is 10.5 today also stable. Radiology: ordered.    Details: CT abdomen pelvis negative for acute intra-abdominal processes including any significant ascites.  He does have portal hypertension and splenomegaly. Discussion of management or test interpretation with external provider(s): Patient discussed with Dr. Jenetta Downer who will order an outpatient cortisol level.  He asked to make sure that patient is taking his eplerenone which patient concurs he is being compliant.  Risk Prescription drug management. Decision regarding hospitalization. Risk Details: No indication for  hospitalization at this time.           Final Clinical Impression(s) / ED Diagnoses Final diagnoses:  Weakness  Hypokalemia    Rx / DC Orders ED Discharge Orders          Ordered    potassium chloride SA (KLOR-CON M) 20 MEQ tablet  2 times daily        09/20/21 1609              Evalee Jefferson, Hershal Coria 09/20/21 1624    Milton Ferguson, MD 09/20/21 1734

## 2021-09-21 DIAGNOSIS — R5382 Chronic fatigue, unspecified: Secondary | ICD-10-CM | POA: Diagnosis not present

## 2021-09-22 LAB — CORTISOL: Cortisol, Plasma: 13.8 ug/dL

## 2021-09-25 ENCOUNTER — Other Ambulatory Visit (INDEPENDENT_AMBULATORY_CARE_PROVIDER_SITE_OTHER): Payer: Self-pay

## 2021-09-25 DIAGNOSIS — R440 Auditory hallucinations: Secondary | ICD-10-CM

## 2021-09-25 DIAGNOSIS — E039 Hypothyroidism, unspecified: Secondary | ICD-10-CM

## 2021-09-25 DIAGNOSIS — R1319 Other dysphagia: Secondary | ICD-10-CM

## 2021-09-25 DIAGNOSIS — K7460 Unspecified cirrhosis of liver: Secondary | ICD-10-CM

## 2021-09-25 DIAGNOSIS — R441 Visual hallucinations: Secondary | ICD-10-CM

## 2021-09-25 DIAGNOSIS — R5382 Chronic fatigue, unspecified: Secondary | ICD-10-CM

## 2021-09-25 DIAGNOSIS — I10 Essential (primary) hypertension: Secondary | ICD-10-CM

## 2021-09-28 ENCOUNTER — Ambulatory Visit (INDEPENDENT_AMBULATORY_CARE_PROVIDER_SITE_OTHER): Payer: Medicare HMO | Admitting: Gastroenterology

## 2021-09-28 DIAGNOSIS — E876 Hypokalemia: Secondary | ICD-10-CM | POA: Diagnosis not present

## 2021-09-28 DIAGNOSIS — E559 Vitamin D deficiency, unspecified: Secondary | ICD-10-CM | POA: Diagnosis not present

## 2021-09-28 DIAGNOSIS — Z7182 Exercise counseling: Secondary | ICD-10-CM | POA: Diagnosis not present

## 2021-09-28 DIAGNOSIS — K769 Liver disease, unspecified: Secondary | ICD-10-CM | POA: Diagnosis not present

## 2021-09-28 DIAGNOSIS — R69 Illness, unspecified: Secondary | ICD-10-CM | POA: Diagnosis not present

## 2021-09-28 DIAGNOSIS — Z79899 Other long term (current) drug therapy: Secondary | ICD-10-CM | POA: Diagnosis not present

## 2021-09-28 DIAGNOSIS — R7989 Other specified abnormal findings of blood chemistry: Secondary | ICD-10-CM | POA: Diagnosis not present

## 2021-09-28 DIAGNOSIS — K7460 Unspecified cirrhosis of liver: Secondary | ICD-10-CM | POA: Diagnosis not present

## 2021-09-28 DIAGNOSIS — E7220 Disorder of urea cycle metabolism, unspecified: Secondary | ICD-10-CM | POA: Diagnosis not present

## 2021-09-28 DIAGNOSIS — E039 Hypothyroidism, unspecified: Secondary | ICD-10-CM | POA: Diagnosis not present

## 2021-09-28 DIAGNOSIS — N182 Chronic kidney disease, stage 2 (mild): Secondary | ICD-10-CM | POA: Diagnosis not present

## 2021-09-28 DIAGNOSIS — R609 Edema, unspecified: Secondary | ICD-10-CM | POA: Diagnosis not present

## 2021-09-28 DIAGNOSIS — K7290 Hepatic failure, unspecified without coma: Secondary | ICD-10-CM | POA: Diagnosis not present

## 2021-10-02 DIAGNOSIS — I10 Essential (primary) hypertension: Secondary | ICD-10-CM | POA: Diagnosis not present

## 2021-10-02 DIAGNOSIS — K7581 Nonalcoholic steatohepatitis (NASH): Secondary | ICD-10-CM | POA: Diagnosis not present

## 2021-10-02 DIAGNOSIS — R441 Visual hallucinations: Secondary | ICD-10-CM | POA: Diagnosis not present

## 2021-10-02 DIAGNOSIS — K7460 Unspecified cirrhosis of liver: Secondary | ICD-10-CM | POA: Diagnosis not present

## 2021-10-02 DIAGNOSIS — R440 Auditory hallucinations: Secondary | ICD-10-CM | POA: Diagnosis not present

## 2021-10-02 DIAGNOSIS — R5382 Chronic fatigue, unspecified: Secondary | ICD-10-CM | POA: Diagnosis not present

## 2021-10-02 DIAGNOSIS — E039 Hypothyroidism, unspecified: Secondary | ICD-10-CM | POA: Diagnosis not present

## 2021-10-02 DIAGNOSIS — R1319 Other dysphagia: Secondary | ICD-10-CM | POA: Diagnosis not present

## 2021-10-06 LAB — TESTOSTERONE, FREE & TOTAL
Free Testosterone: 2.6 pg/mL — ABNORMAL LOW (ref 35.0–155.0)
Testosterone, Total, LC-MS-MS: 27 ng/dL — ABNORMAL LOW (ref 250–1100)

## 2021-10-13 ENCOUNTER — Encounter (HOSPITAL_COMMUNITY)
Admission: RE | Admit: 2021-10-13 | Discharge: 2021-10-13 | Disposition: A | Discharge status: Home / Self Care | Payer: Medicare HMO | Source: Ambulatory Visit | Attending: Gastroenterology | Admitting: Gastroenterology

## 2021-10-13 VITALS — Ht 74.0 in | Wt 372.0 lb

## 2021-10-13 DIAGNOSIS — E876 Hypokalemia: Secondary | ICD-10-CM

## 2021-10-13 HISTORY — DX: Unspecified asthma, uncomplicated: J45.909

## 2021-10-13 HISTORY — DX: Essential (primary) hypertension: I10

## 2021-10-13 HISTORY — DX: Anemia, unspecified: D64.9

## 2021-10-13 HISTORY — DX: Anxiety disorder, unspecified: F41.9

## 2021-10-16 ENCOUNTER — Encounter (HOSPITAL_COMMUNITY): Payer: Self-pay

## 2021-10-16 NOTE — Progress Notes (Signed)
Patient did not follow the prep instructions provided and will need to be rescheduled.  Patient and scheduler  notified.

## 2021-10-17 ENCOUNTER — Ambulatory Visit (HOSPITAL_COMMUNITY): Admission: RE | Admit: 2021-10-17 | Payer: Medicare HMO | Source: Home / Self Care | Admitting: Gastroenterology

## 2021-10-17 ENCOUNTER — Encounter (HOSPITAL_COMMUNITY): Admission: RE | Payer: Self-pay | Source: Home / Self Care

## 2021-10-17 SURGERY — COLONOSCOPY WITH PROPOFOL
Anesthesia: Monitor Anesthesia Care

## 2021-10-19 ENCOUNTER — Encounter: Payer: Medicare HMO | Admitting: Nutrition

## 2021-10-28 ENCOUNTER — Other Ambulatory Visit (INDEPENDENT_AMBULATORY_CARE_PROVIDER_SITE_OTHER): Payer: Self-pay | Admitting: Gastroenterology

## 2021-10-28 DIAGNOSIS — K7460 Unspecified cirrhosis of liver: Secondary | ICD-10-CM

## 2021-10-30 ENCOUNTER — Other Ambulatory Visit (INDEPENDENT_AMBULATORY_CARE_PROVIDER_SITE_OTHER): Payer: Self-pay

## 2021-10-30 DIAGNOSIS — R161 Splenomegaly, not elsewhere classified: Secondary | ICD-10-CM

## 2021-10-30 DIAGNOSIS — I10 Essential (primary) hypertension: Secondary | ICD-10-CM

## 2021-10-30 NOTE — Telephone Encounter (Signed)
Spoke with patient he is aware.Orders placed.

## 2021-10-30 NOTE — Telephone Encounter (Signed)
Needs a repeat BMP for further refills.

## 2021-10-31 ENCOUNTER — Other Ambulatory Visit (INDEPENDENT_AMBULATORY_CARE_PROVIDER_SITE_OTHER): Payer: Self-pay

## 2021-10-31 ENCOUNTER — Encounter (INDEPENDENT_AMBULATORY_CARE_PROVIDER_SITE_OTHER): Payer: Self-pay

## 2021-10-31 DIAGNOSIS — I10 Essential (primary) hypertension: Secondary | ICD-10-CM

## 2021-11-01 ENCOUNTER — Other Ambulatory Visit: Payer: Self-pay | Admitting: Pulmonary Disease

## 2021-11-01 ENCOUNTER — Encounter (INDEPENDENT_AMBULATORY_CARE_PROVIDER_SITE_OTHER): Payer: Self-pay

## 2021-11-06 DIAGNOSIS — K7460 Unspecified cirrhosis of liver: Secondary | ICD-10-CM | POA: Diagnosis not present

## 2021-11-06 DIAGNOSIS — Z79899 Other long term (current) drug therapy: Secondary | ICD-10-CM | POA: Diagnosis not present

## 2021-11-06 DIAGNOSIS — Z7182 Exercise counseling: Secondary | ICD-10-CM | POA: Diagnosis not present

## 2021-11-06 DIAGNOSIS — Z6841 Body Mass Index (BMI) 40.0 and over, adult: Secondary | ICD-10-CM | POA: Diagnosis not present

## 2021-11-06 DIAGNOSIS — Z713 Dietary counseling and surveillance: Secondary | ICD-10-CM | POA: Diagnosis not present

## 2021-11-06 DIAGNOSIS — I509 Heart failure, unspecified: Secondary | ICD-10-CM | POA: Diagnosis not present

## 2021-11-06 DIAGNOSIS — E039 Hypothyroidism, unspecified: Secondary | ICD-10-CM | POA: Diagnosis not present

## 2021-11-06 DIAGNOSIS — R609 Edema, unspecified: Secondary | ICD-10-CM | POA: Diagnosis not present

## 2021-11-06 DIAGNOSIS — R69 Illness, unspecified: Secondary | ICD-10-CM | POA: Diagnosis not present

## 2021-11-06 DIAGNOSIS — D696 Thrombocytopenia, unspecified: Secondary | ICD-10-CM | POA: Diagnosis not present

## 2021-11-14 DIAGNOSIS — H524 Presbyopia: Secondary | ICD-10-CM | POA: Diagnosis not present

## 2021-11-17 ENCOUNTER — Ambulatory Visit: Payer: Medicare HMO | Admitting: "Endocrinology

## 2021-11-20 ENCOUNTER — Telehealth (INDEPENDENT_AMBULATORY_CARE_PROVIDER_SITE_OTHER): Payer: Self-pay

## 2021-11-20 MED ORDER — PEG 3350-KCL-NA BICARB-NACL 420 G PO SOLR: 4000.0000 mL | ORAL | 0 refills | Status: DC

## 2021-11-20 NOTE — Telephone Encounter (Signed)
Daniel Crawford Ann Kayton Dunaj, CMA  ?

## 2021-11-21 ENCOUNTER — Encounter: Payer: Medicare HMO | Admitting: Nutrition

## 2021-11-29 NOTE — Patient Instructions (Signed)
Daniel Crawford  11/29/2021     '@PREFPERIOPPHARMACY'$ @   Your procedure is scheduled on  12/05/2021.   Report to Daniel Crawford at  [place] A.M.   Call this number if you have problems the morning of surgery:  [phone]   Remember:  Follow the diet and prep instructions given to you by the office.     Use your inhaler before you come and bring your rescue inhaler with you.     Take these medicines the morning of surgery with A SIP OF WATER                   coreg, vistaril, synthroid, zofran (if needed), protonix.     Do not wear jewelry, make-up or nail polish.  Do not wear lotions, powders, or perfumes, or deodorant.  Do not shave 48 hours prior to surgery.  Men Daniel Crawford shave face and neck.  Do not bring valuables to the hospital.  [facility] is not responsible for any belongings or valuables.  Contacts, dentures or bridgework Daniel Crawford not be worn into surgery.  Leave your suitcase in the car.  After surgery it Daniel Crawford be brought to your room.  For patients admitted to the hospital, discharge time will be determined by your treatment team.  Patients discharged the day of surgery will not be allowed to drive home and must have someone with them for 24 hours.    Special instructions:   DO NOT smoke tobacco or vape for 24 hours before your procedure.  Please read over the following fact sheets that you were given. Anesthesia Post-op Instructions and Care and Recovery After Surgery      Colonoscopy, Adult, Care After [name]. Your health care provider Daniel Crawford also give you more specific instructions. If you have problems or questions, contact your health care provider. What can I expect after the procedure? After the procedure, it is common to have: A small amount of blood in your stool for 24 hours after the procedure. Some gas. Mild cramping or bloating of your abdomen. Follow these instructions at  home: Eating and drinking  Drink enough fluid to keep your urine pale yellow. Follow instructions from your health care provider about eating or drinking restrictions. Resume your normal diet as told by your health care provider. Avoid heavy or fried foods that are hard to digest. Activity Rest as told by your health care provider. Avoid sitting for a long time without moving. Get up to take short walks every 1-2 hours. This is important to improve blood flow and breathing. Ask for help if you feel weak or unsteady. Return to your normal activities as told by your health care provider. Ask your health care provider what activities are safe for you. Managing cramping and bloating  Try walking around when you have cramps or feel bloated. If directed, apply heat to your abdomen as told by your health care provider. Use the heat source that your health care provider recommends, such as a moist heat pack or a heating pad. Place a towel between your skin and the heat source. Leave the heat on for 20-30 minutes. Remove the heat if your skin turns bright red. This is especially important if you are unable to feel pain, heat, or cold. You have a greater risk of getting burned. General instructions If you were given a sedative during the procedure, it can affect  you for several hours. Do not drive or operate machinery until your health care provider says that it is safe. For the first 24 hours after the procedure: Do not sign important documents. Do not drink alcohol. Do your regular daily activities at a slower pace than normal. Eat soft foods that are easy to digest. Take over-the-counter and prescription medicines only as told by your health care provider. Keep all follow-up visits. This is important. Contact a health care provider if: You have blood in your stool 2-3 days after the procedure. Get help right away if: You have more than a small spotting of blood in your stool. You have large  blood clots in your stool. You have swelling of your abdomen. You have nausea or vomiting. You have a fever. You have increasing pain in your abdomen that is not relieved with medicine. These symptoms Daniel Crawford be an emergency. Get help right away. Call 911. Do not wait to see if the symptoms will go away. Do not drive yourself to the hospital. Summary After the procedure, it is common to have a small amount of blood in your stool. You Daniel Crawford also have mild cramping and bloating of your abdomen. If you were given a sedative during the procedure, it can affect you for several hours. Do not drive or operate machinery until your health care provider says that it is safe. Get help right away if you have a lot of blood in your stool, nausea or vomiting, a fever, or increased pain in your abdomen. This information is not intended to replace advice given to you by your health care provider. Make sure you discuss any questions you have with your health care provider. Document Revised: 11/02/2020 Document Reviewed: 11/02/2020 Elsevier Patient Education  [place] After This sheet gives you information about how to care for yourself after your procedure. Your health care provider Daniel Crawford also give you more specific instructions. If you have problems or questions, contact your health care provider. What can I expect after the procedure? After the procedure, it is common to have: Tiredness. Forgetfulness about what happened after the procedure. Impaired judgment for important decisions. Nausea or vomiting. Some difficulty with balance. Follow these instructions at home: For the time period you were told by your health care provider:     Rest as needed. Do not participate in activities where you could fall or become injured. Do not drive or use machinery. Do not drink alcohol. Do not take sleeping pills or medicines that cause drowsiness. Do not make important  decisions or sign legal documents. Do not take care of children on your own. Eating and drinking Follow the diet that is recommended by your health care provider. Drink enough fluid to keep your urine pale yellow. If you vomit: Drink water, juice, or soup when you can drink without vomiting. Make sure you have little or no nausea before eating solid foods. General instructions Have a responsible adult stay with you for the time you are told. It is important to have someone help care for you until you are awake and alert. Take over-the-counter and prescription medicines only as told by your health care provider. If you have sleep apnea, surgery and certain medicines can increase your risk for breathing problems. Follow instructions from your health care provider about wearing your sleep device: Anytime you are sleeping, including during daytime naps. While taking prescription pain medicines, sleeping medicines, or medicines that make you drowsy. Avoid smoking. Keep  all follow-up visits as told by your health care provider. This is important. Contact a health care provider if: You keep feeling nauseous or you keep vomiting. You feel light-headed. You are still sleepy or having trouble with balance after 24 hours. You develop a rash. You have a fever. You have redness or swelling around the IV site. Get help right away if: You have trouble breathing. You have new-onset confusion at home. Summary For several hours after your procedure, you [place] feel tired. You [place] also be forgetful and have poor judgment. Have a responsible adult stay with you for the time you are told. It is important to have someone help care for you until you are awake and alert. Rest as told. Do not drive or operate machinery. Do not drink alcohol or take sleeping pills. Get help right away if you have trouble breathing, or if you suddenly become confused. This information is not intended to replace advice given to you  by your health care provider. Make sure you discuss any questions you have with your health care provider. Document Revised: 02/14/2021 Document Reviewed: 02/12/2019 Elsevier Patient Education  [place].

## 2021-11-30 ENCOUNTER — Encounter (HOSPITAL_COMMUNITY)
Admission: RE | Admit: 2021-11-30 | Discharge: 2021-11-30 | Disposition: A | Discharge status: Home / Self Care | Payer: Medicare HMO | Source: Ambulatory Visit | Attending: Gastroenterology | Admitting: Gastroenterology

## 2021-11-30 ENCOUNTER — Encounter (HOSPITAL_COMMUNITY): Payer: Self-pay

## 2021-11-30 ENCOUNTER — Ambulatory Visit (HOSPITAL_COMMUNITY)
Admission: RE | Admit: 2021-11-30 | Discharge: 2021-11-30 | Disposition: A | Discharge status: Home / Self Care | Payer: Medicare HMO | Source: Ambulatory Visit | Attending: Gastroenterology | Admitting: Gastroenterology

## 2021-11-30 VITALS — BP 119/60 | HR 72 | Temp 97.9°F | Resp 18 | Ht 74.0 in | Wt 371.9 lb

## 2021-11-30 DIAGNOSIS — R0609 Other forms of dyspnea: Secondary | ICD-10-CM | POA: Insufficient documentation

## 2021-11-30 DIAGNOSIS — K7460 Unspecified cirrhosis of liver: Secondary | ICD-10-CM | POA: Diagnosis not present

## 2021-11-30 DIAGNOSIS — K7581 Nonalcoholic steatohepatitis (NASH): Secondary | ICD-10-CM | POA: Insufficient documentation

## 2021-11-30 DIAGNOSIS — R0602 Shortness of breath: Secondary | ICD-10-CM | POA: Diagnosis not present

## 2021-11-30 DIAGNOSIS — I10 Essential (primary) hypertension: Secondary | ICD-10-CM | POA: Diagnosis not present

## 2021-11-30 DIAGNOSIS — N179 Acute kidney failure, unspecified: Secondary | ICD-10-CM | POA: Diagnosis not present

## 2021-11-30 HISTORY — DX: Heart failure, unspecified: I50.9

## 2021-11-30 HISTORY — DX: Dyspnea, unspecified: R06.00

## 2021-11-30 NOTE — Progress Notes (Signed)
   11/30/21 0940  OBSTRUCTIVE SLEEP APNEA  Have you ever been diagnosed with sleep apnea through a sleep study? No  Do you snore loudly (loud enough to be heard through closed doors)?  0  Do you often feel tired, fatigued, or sleepy during the daytime (such as falling asleep during driving or talking to someone)? 0  Has anyone observed you stop breathing during your sleep? 0  Do you have, or are you being treated for high blood pressure? 1  BMI more than 35 kg/m2? 1  Age > 50 (1-yes) 1  Neck circumference greater than:Male 16 inches or larger, Male 17inches or larger? 1  Male Gender (Yes=1) 1  Obstructive Sleep Apnea Score 5  Score 5 or greater  Results sent to PCP

## 2021-12-04 ENCOUNTER — Other Ambulatory Visit (INDEPENDENT_AMBULATORY_CARE_PROVIDER_SITE_OTHER): Payer: Self-pay

## 2021-12-04 DIAGNOSIS — K7460 Unspecified cirrhosis of liver: Secondary | ICD-10-CM | POA: Diagnosis not present

## 2021-12-04 DIAGNOSIS — E876 Hypokalemia: Secondary | ICD-10-CM | POA: Diagnosis not present

## 2021-12-04 DIAGNOSIS — I509 Heart failure, unspecified: Secondary | ICD-10-CM | POA: Diagnosis not present

## 2021-12-04 DIAGNOSIS — R188 Other ascites: Secondary | ICD-10-CM | POA: Diagnosis not present

## 2021-12-04 DIAGNOSIS — R609 Edema, unspecified: Secondary | ICD-10-CM | POA: Diagnosis not present

## 2021-12-04 DIAGNOSIS — D696 Thrombocytopenia, unspecified: Secondary | ICD-10-CM | POA: Diagnosis not present

## 2021-12-04 DIAGNOSIS — R7989 Other specified abnormal findings of blood chemistry: Secondary | ICD-10-CM | POA: Diagnosis not present

## 2021-12-04 DIAGNOSIS — E8770 Fluid overload, unspecified: Secondary | ICD-10-CM | POA: Diagnosis not present

## 2021-12-04 DIAGNOSIS — E039 Hypothyroidism, unspecified: Secondary | ICD-10-CM | POA: Diagnosis not present

## 2021-12-04 DIAGNOSIS — N179 Acute kidney failure, unspecified: Secondary | ICD-10-CM | POA: Diagnosis not present

## 2021-12-04 DIAGNOSIS — K7031 Alcoholic cirrhosis of liver with ascites: Secondary | ICD-10-CM

## 2021-12-04 DIAGNOSIS — R0600 Dyspnea, unspecified: Secondary | ICD-10-CM | POA: Diagnosis not present

## 2021-12-04 MED ORDER — ALBUMIN HUMAN 25 % IV SOLN: 50.0000 g | Freq: Once | INTRAVENOUS | Status: DC

## 2021-12-05 ENCOUNTER — Encounter (HOSPITAL_COMMUNITY): Admission: RE | Payer: Self-pay | Source: Home / Self Care

## 2021-12-05 ENCOUNTER — Ambulatory Visit (HOSPITAL_COMMUNITY): Admission: RE | Admit: 2021-12-05 | Payer: Medicare HMO | Source: Home / Self Care | Admitting: Gastroenterology

## 2021-12-05 SURGERY — COLONOSCOPY WITH PROPOFOL
Anesthesia: Monitor Anesthesia Care

## 2021-12-06 ENCOUNTER — Encounter (HOSPITAL_COMMUNITY): Payer: Self-pay

## 2021-12-06 ENCOUNTER — Ambulatory Visit (HOSPITAL_COMMUNITY)
Admission: RE | Admit: 2021-12-06 | Discharge: 2021-12-06 | Disposition: A | Discharge status: Home / Self Care | Payer: Medicare HMO | Source: Ambulatory Visit | Attending: Gastroenterology | Admitting: Gastroenterology

## 2021-12-06 ENCOUNTER — Other Ambulatory Visit (INDEPENDENT_AMBULATORY_CARE_PROVIDER_SITE_OTHER): Payer: Self-pay | Admitting: Gastroenterology

## 2021-12-06 DIAGNOSIS — R188 Other ascites: Secondary | ICD-10-CM | POA: Insufficient documentation

## 2021-12-06 DIAGNOSIS — K7581 Nonalcoholic steatohepatitis (NASH): Secondary | ICD-10-CM | POA: Diagnosis not present

## 2021-12-06 DIAGNOSIS — K7460 Unspecified cirrhosis of liver: Secondary | ICD-10-CM | POA: Diagnosis not present

## 2021-12-06 DIAGNOSIS — N189 Chronic kidney disease, unspecified: Secondary | ICD-10-CM | POA: Diagnosis not present

## 2021-12-18 ENCOUNTER — Encounter: Payer: Self-pay | Admitting: Physician Assistant

## 2021-12-18 ENCOUNTER — Ambulatory Visit: Payer: Medicare HMO | Attending: Physician Assistant | Admitting: Physician Assistant

## 2021-12-18 VITALS — BP 122/72 | HR 66 | Ht 74.0 in | Wt 378.4 lb

## 2021-12-18 DIAGNOSIS — E119 Type 2 diabetes mellitus without complications: Secondary | ICD-10-CM | POA: Diagnosis not present

## 2021-12-18 DIAGNOSIS — R252 Cramp and spasm: Secondary | ICD-10-CM

## 2021-12-18 DIAGNOSIS — I10 Essential (primary) hypertension: Secondary | ICD-10-CM

## 2021-12-18 DIAGNOSIS — R600 Localized edema: Secondary | ICD-10-CM

## 2021-12-18 DIAGNOSIS — R0609 Other forms of dyspnea: Secondary | ICD-10-CM

## 2021-12-18 NOTE — Patient Instructions (Signed)
Medication Instructions:  Your physician has recommended you make the following change in your medication:  START: Lasix '80mg'$  twice daily for the next 3 days the return to taking your normal dosage. START: Potassium 2 tablets twice daily or the next 3 days then return to taking your normal dosage.  *If you need a refill on your cardiac medications before your next appointment, please call your pharmacy*  PLEASE BRING ALL OF YOUR MEDICATIONS WITH YOU TO ALL OF YOUR OFFICE VISITS.    Lab Work: Your physician recommends that you return to have the following labs drawn in [place] : BMET and Magnesium. If you have labs (blood work) drawn today and your tests are completely normal, you will receive your results only by: [place] (if you have MyChart) OR A paper copy in the mail If you have any lab test that is abnormal or we need to change your treatment, we will call you to review the results.   Testing/Procedures: NONE   Follow-Up: At [facility], you and your health needs are our priority.  As part of our continuing mission to provide you with exceptional heart care, we have created designated Provider Care Teams.  These Care Teams include your primary Cardiologist (physician) and Advanced Practice Providers (APPs -  Physician Assistants and Nurse Practitioners) who all work together to provide you with the care you need, when you need it.  We recommend signing up for the patient portal called "MyChart".  Sign up information is provided on this After Visit Summary.  MyChart is used to connect with patients for Virtual Visits (Telemedicine).  Patients are able to view lab/test results, encounter notes, upcoming appointments, etc.  Non-urgent messages can be sent to your provider as well.   To learn more about what you can do with MyChart, go to [email or web address].    Your next appointment:   2-3 week(s)  The format for your next appointment:   In Person  Provider:    Rozann Lesches, MD or Almyra Deforest, PA

## 2021-12-18 NOTE — Progress Notes (Unsigned)
Cardiology Office Note:    Date:  12/20/2021   ID:  Daniel Crawford, DOB 16-Apr-1955, MRN [id number]  PCP:  [name], [place] [facility] Cardiologist:  Rozann Lesches, MD     Referring MD: Montez Morita, [facility]*   Chief Complaint  Patient presents with   Follow-up    Seen for Dr. Domenic Polite    History of Present Illness:    Daniel Crawford is a 66 y.o. male with a hx of NASH cirrhosis, CKD stage II, spleen enlargement, thrombocytopenia, hypothyroidism, hypertension and DM 2.  Echocardiogram in 2016 showed EF 60 to 65%, grade 1 DD.  Patient was previously followed by Dr. Curly Rim of advanced heart failure and pulmonary hypertension program at [facility].  His last visit was in February 2018.  He was felt to be volume overloaded at the time and his Lasix was doubled to 80 mg twice a day.  Patient was first established with Dr. Domenic Polite on 07/25/2020 for dyspnea on exertion which he has a longstanding history of.  PFT in March 2020 demonstrated moderate to severe obstructive airway disease with minimal reversibility.  He has been seen by Dr. Elsworth Soho of pulmonology service.  Dr. Domenic Polite recommended repeat echocardiogram which was done on 08/31/2020 that showed EF of 70 to 75%, no regional wall motion abnormality, mild LVH, normal diastolic parameters, trivial MR.  Since the last cardiology visit, patient was admitted to [facility] in January 2023 due to cellulitis of the left leg.  Creatinine was also elevated to 1.68, he was treated with IV Lasix and antibiotic.  Lower extremity Doppler showed no sign of DVT.  He was discharged on 60 mg daily of Lasix and once a day on metolazone.  He was readmitted at [facility] in late January 2023 due to abdominal pain.  Patient was found to have hyperkalemia as well and was treated with potassium supplement.  Nephrology service was consulted.  Patient was seen in the ED on 09/20/2021 with weakness.  Potassium was low at 2.7.  Creatinine 1.45.  CT of  abdomen showed no acute finding or significant ascites.  He was treated with potassium supplement.  Patient presents today with primary complaints of dyspnea exertion and leg cramps.  Patient felt volume overloaded.  He says he is currently taking the Lasix at 80 mg a.m. and 20 mg p.m.  He is also taking potassium chloride 1 tablet twice a day.  On physical exam, he does have 1+ pitting edema.  I recommended increase Lasix to 80 mg twice a day for 3 days before going back to the previous dose.  I will also recommended double up on the potassium chloride to 2 tablet twice a day for 3 days before going back to the previous dose.  He has a history of hypokalemia, and with leg cramps, I recommended basic metabolic panel and magnesium level.  I plan to see the patient back in 2 to 3 weeks for follow-up.  Past Medical History:  Diagnosis Date   Anemia    Anxiety    Asthma    CHF (congestive heart failure) ([place])    CKD (chronic kidney disease)    Depression    Diabetes mellitus without complication ([place])    Dyspnea    Hypertension    Hypothyroidism    Liver cirrhosis secondary to NASH (HCC)    NASH (nonalcoholic steatohepatitis)    Pre-diabetes    Spleen enlarged    Thrombocytopenia ([place])  Past Surgical History:  Procedure Laterality Date   Bilateral hernia surgery     2006, 2007   CHOLECYSTECTOMY     2016    COLONOSCOPY WITH PROPOFOL N/A 06/28/2021   Procedure: COLONOSCOPY WITH PROPOFOL;  Surgeon: Rogene Houston, MD;  [facility]: [facility];  Service: Endoscopy;  Laterality: N/A;  1020   ESOPHAGEAL DILATION N/A 11/06/2019   Procedure: ESOPHAGEAL DILATION;  Surgeon: Harvel Quale, MD;  Location: [facility];  Service: Gastroenterology;  Laterality: N/A;   ESOPHAGOGASTRODUODENOSCOPY (EGD) WITH PROPOFOL N/A 11/06/2019   Procedure: ESOPHAGOGASTRODUODENOSCOPY (EGD) WITH PROPOFOL;  Surgeon: Harvel Quale, MD;  Location: [facility];  Service: Gastroenterology;   Laterality: N/A;  [place] removed     in January of 2018.    POLYPECTOMY  06/28/2021   Procedure: POLYPECTOMY INTESTINAL;  Surgeon: Rogene Houston, MD;  Location: [facility];  Service: Endoscopy;;    Current Medications: Current Meds  Medication Sig   acetaminophen (TYLENOL) 500 MG tablet Take 1,000 mg by mouth every 6 (six) hours as needed for moderate pain.   albuterol (VENTOLIN HFA) 108 (90 Base) MCG/ACT inhaler TAKE 2 PUFFS BY MOUTH EVERY 6 HOURS AS NEEDED FOR WHEEZE OR SHORTNESS OF BREATH   carvedilol (COREG) 12.5 MG tablet Take 12.5 mg by mouth 2 (two) times daily with a meal.   eplerenone (INSPRA) 50 MG tablet TAKE 1 TABLET BY MOUTH EVERY DAY   furosemide (LASIX) 20 MG tablet Take 60-80 mg by mouth daily.   furosemide (LASIX) 40 MG tablet Take 1 tablet (40 mg total) by mouth daily.   gabapentin (NEURONTIN) 100 MG capsule Take 100 mg by mouth at bedtime.   hydrOXYzine (VISTARIL) 50 MG capsule Take 50 mg by mouth in the morning and at bedtime.   lactulose (CHRONULAC) 10 GM/15ML solution Take 30 g by mouth daily as needed for moderate constipation.   levothyroxine (SYNTHROID) 175 MCG tablet Take 175 mcg by mouth daily before breakfast.   metolazone (ZAROXOLYN) 5 MG tablet Take 5 mg by mouth every other day.   ondansetron (ZOFRAN) 8 MG tablet Take 8 mg by mouth every 8 (eight) hours as needed for nausea or vomiting.   pantoprazole (PROTONIX) 40 MG tablet Take 40 mg by mouth daily.   polyethylene glycol-electrolytes (TRILYTE) 420 g solution Take 4,000 mLs by mouth as directed.   potassium chloride SA (KLOR-CON M) 20 MEQ tablet Take 1 tablet (20 mEq total) by mouth 2 (two) times daily.   rifaximin (XIFAXAN) 550 MG TABS tablet Take 550 mg by mouth 2 (two) times daily.   sertraline (ZOLOFT) 50 MG tablet Take 50 mg by mouth at bedtime.   SPIRONOLACTONE PO Take 50 mg by mouth.   traZODone (DESYREL) 50 MG tablet Take 50 mg by mouth at  bedtime.   Current Facility-Administered Medications for the 12/18/21 encounter (Office Visit) with Almyra Deforest, PA  Medication   albumin human 25 % solution 50 g     Allergies:   Patient has no known allergies.   Social History   Socioeconomic History   Marital status: Single    Spouse name: Not on file   Number of children: Not on file   Years of education: Not on file   Highest education level: Not on file  Occupational History   Not on file  Tobacco Use   Smoking status: Former    Packs/day: 1.00  Years: 20.00    Total pack years: 20.00    Types: Cigarettes    Quit date: 2017    Years since quitting: 6.7   Smokeless tobacco: Never  Vaping Use   Vaping Use: Never used  Substance and Sexual Activity   Alcohol use: No   Drug use: No   Sexual activity: Not on file  Other Topics Concern   Not on file  Social History Narrative   Not on file   Social Determinants of Health   Financial Resource Strain: Not on file  Food Insecurity: Not on file  Transportation Needs: Not on file  Physical Activity: Not on file  Stress: Not on file  Social Connections: Not on file     Family History: The patient's family history includes Aneurysm in his sister; Heart disease in his mother; Liver cancer in his mother.  ROS:   Please see the history of present illness.     All other systems reviewed and are negative.  EKGs/Labs/Other Studies Reviewed:    The following studies were reviewed today:  Echo 08/31/2020 1. Left ventricular ejection fraction, by estimation, is 70 to 75%. The left ventricle has hyperdynamic function. The left ventricle has no regional wall motion abnormalities. The left ventricular internal cavity size was mildly dilated. There is mild  left ventricular hypertrophy. Left ventricular diastolic parameters were normal. 2. Right ventricular systolic function is normal. The right ventricular size is normal. 3. The mitral valve is normal in structure. Trivial  mitral valve regurgitation. 4. The aortic valve is normal in structure. Aortic valve regurgitation is not visualized. 5. The inferior vena cava is dilated in size with >50% respiratory variability, suggesting right atrial pressure of 8 mmHg.  EKG:  EKG is ordered today.  The ekg ordered today demonstrates normal sinus rhythm, no significant ST-T wave changes  Recent Labs: 09/20/2021: ALT 24; BUN 23; Creatinine, Ser 1.45; Hemoglobin 10.5; Magnesium 2.1; Platelets 62; Potassium 2.7; Sodium 134; TSH 1.209  Recent Lipid Panel No results found for: "CHOL", "TRIG", "HDL", "CHOLHDL", "VLDL", "LDLCALC", "LDLDIRECT"   Risk Assessment/Calculations:           Physical Exam:    VS:  BP 122/72   Pulse 66   Ht '6\' 2"'$  (1.88 m)   Wt (!) 378 lb 6.4 oz (171.6 kg)   SpO2 99%   BMI 48.58 kg/m        Wt Readings from Last 3 Encounters:  12/18/21 (!) 378 lb 6.4 oz (171.6 kg)  11/30/21 (!) 371 lb 14.7 oz (168.7 kg)  10/16/21 (!) 372 lb (168.7 kg)     GEN:  Well nourished, well developed in no acute distress HEENT: Normal NECK: No JVD; No carotid bruits LYMPHATICS: No lymphadenopathy CARDIAC: RRR, no murmurs, rubs, gallops RESPIRATORY:  Clear to auscultation without rales, wheezing or rhonchi  ABDOMEN: Soft, non-tender, non-distended MUSCULOSKELETAL:  1+ edema; No deformity  SKIN: Warm and dry NEUROLOGIC:  Alert and oriented x 3 PSYCHIATRIC:  Normal affect   ASSESSMENT:    1. DOE (dyspnea on exertion)   2. Leg edema   3. Leg cramps    PLAN:    In order of problems listed above:  Dyspnea on exertion: Patient appears to be volume overloaded.  I recommended increase Lasix to 80 mg twice a day for 3 days before going back to the previous dose.  During the meantime, I also recommended double up on the potassium supplement for 3 days as well before going back to  the previous dose.  He is on metolazone 5 mg every other day.  Leg edema: Patient is currently taking 80 mg a.m. and 20 mg p.m.  dose of Lasix.  He appears to be volume overloaded, see #1 with regard to medication changes  Leg cramps: Obtain basic metabolic panel and magnesium level.  Patient has a history of hypokalemia  Hypertension: Blood pressure stable  DM 2: Managed by primary care provider.           Medication Adjustments/Labs and Tests Ordered: Current medicines are reviewed at length with the patient today.  Concerns regarding medicines are outlined above.  Orders Placed This Encounter  Procedures   Basic Metabolic Panel (BMET)   Magnesium   EKG 12-Lead   No orders of the defined types were placed in this encounter.   Patient Instructions  Medication Instructions:  Your physician has recommended you make the following change in your medication:  START: Lasix '80mg'$  twice daily for the next 3 days the return to taking your normal dosage. START: Potassium 2 tablets twice daily or the next 3 days then return to taking your normal dosage.  *If you need a refill on your cardiac medications before your next appointment, please call your pharmacy*  PLEASE BRING ALL OF YOUR MEDICATIONS WITH YOU TO ALL OF YOUR OFFICE VISITS.    Lab Work: Your physician recommends that you return to have the following labs drawn in [place] : BMET and Magnesium. If you have labs (blood work) drawn today and your tests are completely normal, you will receive your results only by: [place] (if you have MyChart) OR A paper copy in the mail If you have any lab test that is abnormal or we need to change your treatment, we will call you to review the results.   Testing/Procedures: NONE   Follow-Up: At [facility], you and your health needs are our priority.  As part of our continuing mission to provide you with exceptional heart care, we have created designated Provider Care Teams.  These Care Teams include your primary Cardiologist (physician) and Advanced Practice Providers (APPs -  Physician  Assistants and Nurse Practitioners) who all work together to provide you with the care you need, when you need it.  We recommend signing up for the patient portal called "MyChart".  Sign up information is provided on this After Visit Summary.  MyChart is used to connect with patients for Virtual Visits (Telemedicine).  Patients are able to view lab/test results, encounter notes, upcoming appointments, etc.  Non-urgent messages can be sent to your provider as well.   To learn more about what you can do with MyChart, go to [email or web address].    Your next appointment:   2-3 week(s)  The format for your next appointment:   In Person  Provider:   Rozann Lesches, MD or Almyra Deforest, PA     Signed, [place], [place]  12/20/2021 11:08 PM    [place]

## 2021-12-20 ENCOUNTER — Encounter: Payer: Self-pay | Admitting: Physician Assistant

## 2021-12-25 ENCOUNTER — Other Ambulatory Visit (HOSPITAL_COMMUNITY)
Admission: RE | Admit: 2021-12-25 | Discharge: 2021-12-25 | Disposition: A | Discharge status: Home / Self Care | Payer: Medicare HMO | Source: Ambulatory Visit | Attending: Physician Assistant | Admitting: Physician Assistant

## 2021-12-25 DIAGNOSIS — R600 Localized edema: Secondary | ICD-10-CM | POA: Diagnosis not present

## 2021-12-25 DIAGNOSIS — R252 Cramp and spasm: Secondary | ICD-10-CM | POA: Insufficient documentation

## 2021-12-25 LAB — MAGNESIUM: Magnesium: 2.1 mg/dL (ref 1.7–2.4)

## 2021-12-25 LAB — BASIC METABOLIC PANEL
Anion gap: 5 (ref 5–15)
BUN: 15 mg/dL (ref 8–23)
CO2: 28 mmol/L (ref 22–32)
Calcium: 8.8 mg/dL — ABNORMAL LOW (ref 8.9–10.3)
Chloride: 106 mmol/L (ref 98–111)
Creatinine, Ser: 1.44 mg/dL — ABNORMAL HIGH (ref 0.61–1.24)
GFR, Estimated: 55 mL/min — ABNORMAL LOW (ref >60)
Glucose, Bld: 137 mg/dL — ABNORMAL HIGH (ref 70–99)
Potassium: 4.4 mmol/L (ref 3.5–5.1)
Sodium: 139 mmol/L (ref 135–145)

## 2021-12-26 ENCOUNTER — Encounter: Payer: Self-pay | Admitting: "Endocrinology

## 2021-12-26 ENCOUNTER — Ambulatory Visit: Payer: Medicare HMO | Admitting: "Endocrinology

## 2021-12-26 VITALS — BP 120/84 | HR 64 | Ht 74.0 in | Wt 382.0 lb

## 2021-12-26 DIAGNOSIS — E291 Testicular hypofunction: Secondary | ICD-10-CM | POA: Diagnosis not present

## 2021-12-26 NOTE — Patient Instructions (Signed)
                                     Advice for Weight Management  -For most of us the best way to lose weight is by diet management. Generally speaking, diet management means consuming less calories intentionally which over time brings about progressive weight loss.  This can be achieved more effectively by avoiding ultra processed carbohydrates, processed meats, unhealthy fats.    It is critically important to know your numbers: how much calorie you are consuming and how much calorie you need. More importantly, our carbohydrates sources should be unprocessed naturally occurring  complex starch food items.  It is always important to balance nutrition also by  appropriate intake of proteins (mainly plant-based), healthy fats/oils, plenty of fruits and vegetables.   -The American College of Lifestyle Medicine (ACL M) recommends nutrition derived mostly from Whole Food, Plant Predominant Sources example an apple instead of applesauce or apple pie. Eat Plenty of vegetables, Mushrooms, fruits, Legumes, Whole Grains, Nuts, seeds in lieu of processed meats, processed snacks/pastries red meat, poultry, eggs.  Use only water or unsweetened tea for hydration.  The College also recommends the need to stay away from risky substances including alcohol, smoking; obtaining 7-9 hours of restorative sleep, at least 150 minutes of moderate intensity exercise weekly, importance of healthy social connections, and being mindful of stress and seek help when it is overwhelming.    -Sticking to a routine mealtime to eat 3 meals a day and avoiding unnecessary snacks is shown to have a big role in weight control. Under normal circumstances, the only time we burn stored energy is when we are hungry, so allow  some hunger to take place- hunger means no food between appropriate meal times, only water.  It is not advisable to starve.   -It is better to avoid simple carbohydrates including:  Cakes, Sweet Desserts, Ice Cream, Soda (diet and regular), Sweet Tea, Candies, Chips, Cookies, Store Bought Juices, Alcohol in Excess of  1-2 drinks a day, Lemonade,  Artificial Sweeteners, Doughnuts, Coffee Creamers, "Sugar-free" Products, etc, etc.  This is not a complete list.....    -Consulting with certified diabetes educators is proven to provide you with the most accurate and current information on diet.  Also, you [place] be  interested in discussing diet options/exchanges , we can schedule a visit with Penny Crumpton, RDN, CDE for individualized nutrition education.  -Exercise: If you are able: 30 -60 minutes a day ,4 days a week, or 150 minutes of moderate intensity exercise weekly.    The longer the better if tolerated.  Combine stretch, strength, and aerobic activities.  If you were told in the past that you have high risk for cardiovascular diseases, or if you are currently symptomatic, you [place] seek evaluation by your heart doctor prior to initiating moderate to intense exercise programs.                                  Additional Care Considerations for Diabetes/Prediabetes   -Diabetes  is a chronic disease.  The most important care consideration is regular follow-up with your diabetes care provider with the goal being avoiding or delaying its complications and to take advantage of advances in medications and technology.  If appropriate actions are taken early enough, type 2 diabetes can even be   reversed.  Seek information from the right source.  - Whole Food, Plant Predominant Nutrition is highly recommended: Eat Plenty of vegetables, Mushrooms, fruits, Legumes, Whole Grains, Nuts, seeds in lieu of processed meats, processed snacks/pastries red meat, poultry, eggs as recommended by American College of  Lifestyle Medicine (ACLM).  -Type 2 diabetes is known to coexist with other important comorbidities such as high blood pressure and high cholesterol.  It is critical to control not only the  diabetes but also the high blood pressure and high cholesterol to minimize and delay the risk of complications including coronary artery disease, stroke, amputations, blindness, etc.  The good news is that this diet recommendation for type 2 diabetes is also very helpful for managing high cholesterol and high blood blood pressure.  - Studies showed that people with diabetes will benefit from a class of medications known as ACE inhibitors and statins.  Unless there are specific reasons not to be on these medications, the standard of care is to consider getting one from these groups of medications at an optimal doses.  These medications are generally considered safe and proven to help protect the heart and the kidneys.    - People with diabetes are encouraged to initiate and maintain regular follow-up with eye doctors, foot doctors, dentists , and if necessary heart and kidney doctors.     - It is highly recommended that people with diabetes quit smoking or stay away from smoking, and get yearly  flu vaccine and pneumonia vaccine at least every 5 years.  See above for additional recommendations on exercise, sleep, stress management , and healthy social connections.      

## 2021-12-26 NOTE — Progress Notes (Signed)
Endocrinology Consult Note                                            12/26/2021, 12:57 PM   Subjective:    Patient ID: Daniel Crawford, male    DOB: 12-Jul-1955, PCP [name], MD   Past Medical History:  Diagnosis Date   Anemia    Anxiety    Asthma    CHF (congestive heart failure) ([place])    CKD (chronic kidney disease)    Depression    Diabetes mellitus without complication ([place])    Dyspnea    Hypertension    Hypothyroidism    Liver cirrhosis secondary to NASH ([place])    NASH (nonalcoholic steatohepatitis)    Pre-diabetes    Spleen enlarged    Thrombocytopenia ([place])    Past Surgical History:  Procedure Laterality Date   Bilateral hernia surgery     2006, 2007   CHOLECYSTECTOMY     2016    COLONOSCOPY WITH PROPOFOL N/A 06/28/2021   Procedure: COLONOSCOPY WITH PROPOFOL;  Surgeon: Rogene Houston, MD;  [facility];  Service: Endoscopy;  Laterality: N/A;  1020   ESOPHAGEAL DILATION N/A 11/06/2019   Procedure: ESOPHAGEAL DILATION;  Surgeon: Harvel Quale, MD;  Location[facility];  Service: Gastroenterology;  Laterality: N/A;   ESOPHAGOGASTRODUODENOSCOPY (EGD) WITH PROPOFOL N/A 11/06/2019   Procedure: ESOPHAGOGASTRODUODENOSCOPY (EGD) WITH PROPOFOL;  Surgeon: Harvel Quale, MD;  [facility];  Service: Gastroenterology;  Laterality: N/A;  [place] removed     in January of 2018.    POLYPECTOMY  06/28/2021   Procedure: POLYPECTOMY INTESTINAL;  Surgeon: Rogene Houston, MD;  [facility];  Service: Endoscopy;;   Social History   Socioeconomic History   Marital status: Single    Spouse name: Not on file   Number of children: Not on file   Years of education: Not on file   Highest education level: Not on file  Occupational History   Not on file  Tobacco Use   Smoking status: Former    Packs/day: 1.00    Years: 20.00    Total pack years: 20.00     Types: Cigarettes    Quit date: 2017    Years since quitting: 6.7   Smokeless tobacco: Never  Vaping Use   Vaping Use: Never used  Substance and Sexual Activity   Alcohol use: No   Drug use: No   Sexual activity: Not on file  Other Topics Concern   Not on file  Social History Narrative   Not on file   Social Determinants of Health   Financial Resource Strain: Not on file  Food Insecurity: Not on file  Transportation Needs: Not on file  Physical Activity: Not on file  Stress: Not on file  Social Connections: Not on file   Family History  Problem Relation Age of Onset   Liver cancer Mother    Heart disease Mother    Aneurysm Sister    Outpatient Encounter Medications as of 12/26/2021  Medication Sig   acetaminophen (TYLENOL) 500 MG tablet Take 1,000 mg by mouth every 6 (six) hours as needed for moderate pain.   albuterol (VENTOLIN HFA) 108 (90 Base) MCG/ACT inhaler TAKE 2 PUFFS BY MOUTH EVERY  6 HOURS AS NEEDED FOR WHEEZE OR SHORTNESS OF BREATH   carvedilol (COREG) 12.5 MG tablet Take 12.5 mg by mouth 2 (two) times daily with a meal.   eplerenone (INSPRA) 50 MG tablet TAKE 1 TABLET BY MOUTH EVERY DAY   furosemide (LASIX) 20 MG tablet Take 60-80 mg by mouth daily.   furosemide (LASIX) 40 MG tablet Take 1 tablet (40 mg total) by mouth daily.   gabapentin (NEURONTIN) 100 MG capsule Take 100 mg by mouth at bedtime.   hydrOXYzine (VISTARIL) 50 MG capsule Take 50 mg by mouth in the morning and at bedtime.   lactulose (CHRONULAC) 10 GM/15ML solution Take 30 g by mouth daily as needed for moderate constipation.   levothyroxine (SYNTHROID) 175 MCG tablet Take 175 mcg by mouth daily before breakfast.   metolazone (ZAROXOLYN) 5 MG tablet Take 5 mg by mouth every other day.   ondansetron (ZOFRAN) 8 MG tablet Take 8 mg by mouth every 8 (eight) hours as needed for nausea or vomiting.   pantoprazole (PROTONIX) 40 MG tablet Take 40 mg by mouth daily.   polyethylene glycol-electrolytes  (TRILYTE) 420 g solution Take 4,000 mLs by mouth as directed.   potassium chloride SA (KLOR-CON M) 20 MEQ tablet Take 1 tablet (20 mEq total) by mouth 2 (two) times daily.   rifaximin (XIFAXAN) 550 MG TABS tablet Take 550 mg by mouth 2 (two) times daily.   sertraline (ZOLOFT) 50 MG tablet Take 50 mg by mouth at bedtime.   SPIRONOLACTONE PO Take 50 mg by mouth.   traZODone (DESYREL) 50 MG tablet Take 50 mg by mouth at bedtime.   Facility-Administered Encounter Medications as of 12/26/2021  Medication   albumin human 25 % solution 50 g   ALLERGIES: No Known Allergies  VACCINATION STATUS: Immunization History  Administered Date(s) Administered   Influenza,inj,Quad PF,6-35 Mos 01/12/2020    HPI Daniel Crawford is 67 y.o. male who presents today with a medical history as above. he is being seen in consultation for hypogonadism requested by [name], MD.  History is obtained directly from the patient as well as chart review.  His medical record showed profound hypogonadism.  Patient also has multiple medical problems including morbid obesity, NASH complicated by end-stage cirrhosis, CHF, CKD. He has clinical snoring, and has medical history of obstructive sleep apnea.   He denies any prior history of testicular injury, radiation, nor chemotherapy.  He reports low libido.  His major concern is fatigue.  He denies any prostate pathology. He wishes to achieve weight loss. He has hypothyroidism on levothyroxine 175 mcg p.o. daily before breakfast.  He reports consistency and compliance to his medications.  He is not on any particular diet program.   Review of Systems  Constitutional: + Progressive weight gain, complicated by ascites/edema, + fatigue, + subjective hypothermia Eyes: no blurry vision, no xerophthalmia ENT: no sore throat, no nodules palpated in throat, no dysphagia/odynophagia, no hoarseness Cardiovascular: no Chest Pain, no Shortness of Breath, no palpitations, no leg  swelling Respiratory: no cough, + emotional shortness of breath Gastrointestinal: no Nausea/Vomiting/Diarhhea Musculoskeletal: no muscle/joint aches Skin: no rashes Neurological: no tremors, no numbness, no tingling, no dizziness Psychiatric: +depression, no anxiety  Objective:       12/26/2021    9:20 AM 12/18/2021   10:07 AM 12/06/2021    9:05 AM  Vitals with BMI  Height '6\' 2"'$  '6\' 2"'$    Weight 382 lbs 378 lbs 6 oz   BMI 63.87 56.43   Systolic  892 119 417  Diastolic 84 72 66  Pulse 64 66 70    BP 120/84   Pulse 64   Ht '6\' 2"'$  (1.88 m)   Wt (!) 382 lb (173.3 kg)   BMI 49.05 kg/m   Wt Readings from Last 3 Encounters:  12/26/21 (!) 382 lb (173.3 kg)  12/18/21 (!) 378 lb 6.4 oz (171.6 kg)  11/30/21 (!) 371 lb 14.7 oz (168.7 kg)    Physical Exam  Constitutional:  Body mass index is 49.05 kg/m.,  not in acute distress, + dysphoric state of mind Eyes: PERRLA, EOMI, no exophthalmos ENT: moist mucous membranes, no gross thyromegaly, no gross cervical lymphadenopathy Cardiovascular: + Distant heart sounds Respiratory:  adequate breathing efforts, no gross chest deformity, + tight chest with poor air entry.    Gastrointestinal: abdomen soft,+ obese,  Non -tender,  Bowel Sounds present, no gross organomegaly Musculoskeletal: + Has pretibial and pedal edema bilaterally on lower extremities.   Skin: moist, warm, no rashes, + dry skin, poor hygiene.  Testicular exam: Showed right testis to 10 cc, left testis measured 15 cc Neurological: no tremor with outstretched hands, Deep tendon reflexes normal in bilateral lower extremities.  CMP ( most recent) CMP     Component Value Date/Time   NA 139 12/25/2021 1510   K 4.4 12/25/2021 1510   CL 106 12/25/2021 1510   CO2 28 12/25/2021 1510   GLUCOSE 137 (H) 12/25/2021 1510   BUN 15 12/25/2021 1510   CREATININE 1.44 (H) 12/25/2021 1510   CREATININE 1.27 08/15/2021 1354   CALCIUM 8.8 (L) 12/25/2021 1510   PROT 6.6 09/20/2021 1139    ALBUMIN 2.9 (L) 09/20/2021 1139   AST 54 (H) 09/20/2021 1139   ALT 24 09/20/2021 1139   ALKPHOS 59 09/20/2021 1139   BILITOT 3.3 (H) 09/20/2021 1139   GFRNONAA 55 (L) 12/25/2021 1510   GFRAA >60 02/18/2017 1141       Latest Reference Range & Units Most Recent  ALDOSTERONE 0.0 - 30.0 ng/dL 21.2 04/21/21 09:15  PRA LC/MS/MS 0.167 - 5.380 ng/mL/hr 7.572 (H) 04/21/21 09:15  ALDO / PRA Ratio 0.0 - 30.0  2.8 04/21/21 09:15  Cortisol, Plasma mcg/dL 13.8 09/21/21 09:34  Glucose 70 - 99 mg/dL 137 (H) 12/25/21 15:10  Free Testosterone 35.0 - 155.0 pg/mL 2.6 (L) 10/02/21 13:53  Testosterone, Total, LC-MS-MS 250 - 1,100 ng/dL 27 (L) 10/02/21 13:53  TSH 0.350 - 4.500 uIU/mL 1.209 09/20/21 11:39  (H): Data is abnormally high (L): Data is abnormally low  Lab Results  Component Value Date   TSH 1.209 09/20/2021   TSH 4.704 (H) 04/21/2021   TSH 0.40 10/22/2019           Assessment & Plan:   1. Hypogonadism, male 2. Morbid obesity [facility])   - Daniel Crawford  is being seen at a kind request of [name], MD. - I have reviewed his available  records and clinically evaluated the patient. - Based on these reviews, he has profound hypogonadism with total testosterone of 27,  however, this patient is not suitable candidate for testosterone replacement therapy given his obstructive sleep apnea, high risk for DVT.    I had a long discussion with him about lifestyle medicine.  He stands to benefit from major change in his lifestyle from the point of view of his metabolic dysfunction.  - he acknowledges that there is a room for improvement in his food and drink choices. - Suggestion is made for him to avoid  simple carbohydrates  from his diet including Cakes, Sweet Desserts, Ice Cream, Soda (diet and regular), Sweet Tea, Candies, Chips, Cookies, Store Bought Juices, Alcohol , Artificial Sweeteners,  Coffee Creamer, and "Sugar-free" Products, Lemonade. This will help patient to have more stable blood  glucose profile and potentially avoid unintended weight gain.  The following Lifestyle Medicine recommendations according to [place]  [facility]) were discussed and and offered to patient and he  agrees to start the journey:  A. Whole Foods, Plant-Based Nutrition comprising of fruits and vegetables, plant-based proteins, whole-grain carbohydrates was discussed in detail with the patient.   A list for source of those nutrients were also provided to the patient.  Patient will use only water or unsweetened tea for hydration. B.  The need to stay away from risky substances including alcohol, smoking; obtaining 7 to 9 hours of restorative sleep, at least 150 minutes of moderate intensity exercise weekly, the importance of healthy social connections,  and stress management techniques were discussed. C.  A full color page of  Calorie density of various food groups per pound showing examples of each food groups was provided to the patient.  This packet was discussed with him in exam room. He will return in 3 months with previsit labs to include total testosterone, PSA, vitamin D and B12, prolactin, LH /FSH If he is engaged in lifestyle medicine, and still found to have hypogonadism, he will be reassessed for utility  of androgen replacement therapy.  - he is advised to maintain close follow up with [name], MD for primary care needs.   - Time spent with the patient: 60 minutes, of which >50% was spent in  counseling him about his morbid obesity, hypogonadism, metabolic syndrome and the rest in obtaining information about his symptoms, reviewing his previous labs/studies ( including abstractions from other facilities),  evaluations, and treatments,  and developing a plan to confirm diagnosis and long term treatment based on the latest standards of care/guidelines; and documenting his care.  Daniel Crawford participated in the discussions, expressed understanding, and voiced  agreement with the above plans.  All questions were answered to his satisfaction. he is encouraged to contact clinic should he have any questions or concerns prior to his return visit.  Follow up plan: Return in about 3 months (around 03/28/2022) for Fasting Labs  in AM B4 8, [place] with Ariella Voit, A1c -[place].   Glade Lloyd, MD [facility] [address] [place], [place] [address] Phone: [phone]  Fax: [phone]     12/26/2021, 12:57 PM  This note was partially dictated with voice recognition software. Similar sounding words can be transcribed inadequately or Picking not  be corrected upon review.

## 2021-12-27 ENCOUNTER — Ambulatory Visit: Payer: Medicare HMO | Attending: Cardiology | Admitting: Physician Assistant

## 2021-12-27 VITALS — BP 144/78 | HR 70 | Wt 385.4 lb

## 2021-12-27 DIAGNOSIS — I5032 Chronic diastolic (congestive) heart failure: Secondary | ICD-10-CM

## 2021-12-27 DIAGNOSIS — R0600 Dyspnea, unspecified: Secondary | ICD-10-CM

## 2021-12-27 DIAGNOSIS — K7460 Unspecified cirrhosis of liver: Secondary | ICD-10-CM

## 2021-12-27 DIAGNOSIS — Z01810 Encounter for preprocedural cardiovascular examination: Secondary | ICD-10-CM

## 2021-12-27 DIAGNOSIS — D696 Thrombocytopenia, unspecified: Secondary | ICD-10-CM

## 2021-12-27 DIAGNOSIS — K7581 Nonalcoholic steatohepatitis (NASH): Secondary | ICD-10-CM | POA: Diagnosis not present

## 2021-12-27 MED ORDER — SPIRONOLACTONE 25 MG PO TABS: 25.0000 mg | ORAL_TABLET | Freq: Every day | ORAL | 3 refills | Status: DC

## 2021-12-27 NOTE — Patient Instructions (Addendum)
Medication Instructions:  TAKE Spironolactone 25 mg daily, TAKE Lasix 80 mg every morning 20 mg every afternoon except for every Tuesday and Saturday take 80 mg 2 times a day INCREASE Potassium to 40 meq 2 times a day every Tuesday and Saturday when Lasix is taken 80 mg 2 times a day TAKE Potassium 20 meq 2 times a day every Sunday, Monday, Wednesday, Thursday and Friday.  *If you need a refill on your cardiac medications before your next appointment, please call your pharmacy*  Lab Work: Your physician recommends that you return for lab work TODAY:  BMP CBC  If you have labs (blood work) drawn today and your tests are completely normal, you will receive your results only by: [place] (if you have MyChart) OR A paper copy in the mail If you have any lab test that is abnormal or we need to change your treatment, we will call you to review the results.   Testing/Procedures: Almyra Deforest, PA-C has requested that you have a cardiac catheterization. Cardiac catheterization is used to diagnose and/or treat various heart conditions. Doctors [place]zier recommend this procedure for a number of different reasons. The most common reason is to evaluate chest pain. Chest pain can be a symptom of coronary artery disease (CAD), and cardiac catheterization can show whether plaque is narrowing or blocking your heart's arteries. This procedure is also used to evaluate the valves, as well as measure the blood flow and oxygen levels in different parts of your heart. For further information please visit [email or web address]. Please follow instruction sheet, as given.  Follow-Up: At [facility], you and your health needs are our priority.  As part of our continuing mission to provide you with exceptional heart care, we have created designated Provider Care Teams.  These Care Teams include your primary Cardiologist (physician) and Advanced Practice Providers (APPs -  Physician Assistants and Nurse Practitioners)  who all work together to provide you with the care you need, when you need it.  We recommend signing up for the patient portal called "MyChart".  Sign up information is provided on this After Visit Summary.  MyChart is used to connect with patients for Virtual Visits (Telemedicine).  Patients are able to view lab/test results, encounter notes, upcoming appointments, etc.  Non-urgent messages can be sent to your provider as well.   To learn more about what you can do with MyChart, go to [email or web address].    Your next appointment:   3 month(s)  The format for your next appointment:   In Person  Provider:   Rozann Lesches, MD   Other Instructions You have been referred to Heart Failure clinic         Cardiac/Peripheral Catheterization   You are scheduled for a Cardiac Catheterization on Monday, October 16 with Dr. Larae Grooms.  1. Please arrive at the Main Entrance A at [facility]: [place], [place] [address] on October 16 at 8:30 AM (This time is two hours before your procedure to ensure your preparation). Free valet parking service is available. You will check in at ADMITTING. The support person will be asked to wait in the waiting room.  It is OK to have someone drop you off and come back when you are ready to be discharged.        Special note: Every effort is made to have your procedure done on time. Please understand that emergencies sometimes delay scheduled procedures.   . 2. Diet: Do not eat  solid foods after midnight.  You [place] have clear liquids until 5 AM the day of the procedure.  3. Labs: You will need to have blood drawn on Wednesday, October 4 at [facility]  Open: [place] (Lunch 12:30 - 1:30)   Phone: [phone]. You do not need to be fasting.  4. Medication instructions in preparation for your procedure:   Contrast Allergy: No    On the morning of your procedure, take Aspirin 81 mg  and any morning medicines NOT listed above.  You [place] use sips of water.  5. Plan to go home the same day, you will only stay overnight if medically necessary. 6. You MUST have a responsible adult to drive you home. 7. An adult MUST be with you the first 24 hours after you arrive home. 8. Bring a current list of your medications, and the last time and date medication taken. 9. Bring ID and current insurance cards. 10.Please wear clothes that are easy to get on and off and wear slip-on shoes.  Thank you for allowing [place] to care for you!   -- [facility]   Important Information About Sugar

## 2021-12-27 NOTE — Progress Notes (Unsigned)
Cardiology Office Note:    Date:  12/28/2021   ID:  Daniel Crawford, DOB [birth year], MRN [id number]  PCP:  [name], [place] [facility] Cardiologist:  Rozann Lesches, MD     Referring MD: [name], MD   Chief Complaint  Patient presents with   Follow-up   Shortness of Breath    History of Present Illness:    Crawford Daniel is a 66 y.o. male with a hx of NASH cirrhosis, CKD stage II, spleen enlargement, thrombocytopenia, hypothyroidism, hypertension and DM 2.  Echocardiogram in 2016 showed EF 60 to 65%, grade 1 DD.  Patient was previously followed by Dr. Curly Rim of advanced heart failure and pulmonary hypertension program at [facility].  His last visit was in February 2018.  He was felt to be volume overloaded at the time and his Lasix was doubled to 80 mg twice a day.  Patient was first established with Dr. Domenic Polite on 07/25/2020 for dyspnea on exertion which he has a longstanding history of.  PFT in March 2020 demonstrated moderate to severe obstructive airway disease with minimal reversibility.  He has been seen by Dr. Elsworth Soho of pulmonology service.  Dr. Domenic Polite recommended repeat echocardiogram which was done on 08/31/2020 that showed EF of 70 to 75%, no regional wall motion abnormality, mild LVH, normal diastolic parameters, trivial MR.  Since the last cardiology visit, patient was admitted to [facility] in January 2023 due to cellulitis of the left leg.  Creatinine was also elevated to 1.68, he was treated with IV Lasix and antibiotic.  Lower extremity Doppler showed no sign of DVT.  He was discharged on 60 mg daily of Lasix and once a day on metolazone.  He was readmitted at [facility] in late January 2023 due to abdominal pain.  Patient was found to have hyperkalemia as well and was treated with potassium supplement.  Nephrology service was consulted.  Patient was seen in the ED on 09/20/2021 with weakness.  Potassium was low at 2.7.  Creatinine 1.45.  CT of abdomen  showed no acute finding or significant ascites.  He was treated with potassium supplement.  I last saw the patient on 12/18/2021 at which time he complained of dyspnea on exertion and leg cramps.  He felt volume overloaded.  He was on 80 mg a.m. and 20 mg p.m. of Lasix.  I increased his Lasix to 80 mg twice a day for 3 days before going back to the previous dose.  He had 1+ pitting edema on exam at the time.  I also asked him to double up on his potassium chloride.  Patient presents today for follow-up.  His weight actually increased since the last visit.  Compliance is somewhat questionable although patient says he has been compliant with the medication.  He took 80 mg twice a day for 3 days before going down to 80 mg a.m. and 20 mg p.m. of Lasix.  He is on spironolactone 25 mg daily not on eplerenone.  We removed eplerenone from his medication list.  I suspect he is short of breath is due to combination of morbid obesity, obstructive pulmonary issue, liver cirrhosis and chronic diastolic heart failure.  It is difficult to say how much the diastolic heart failure is contributing to his shortness of breath.  I discussed the case with Dr. Gardiner Rhyme, we recommend the patient to proceed with right heart cath.  He has a history of thrombocytopenia and spleen enlargement.  Baseline platelet  is about 60-70 K on previous lab work.  We will obtain CBC to make sure platelet does not drop below 50 K.  He will need referral to heart failure service as well.  He was previously followed by heart failure service at [facility].  This patient Egge be a candidate for CardioMEMS device in the future.   Past Medical History:  Diagnosis Date   Anemia    Anxiety    Asthma    CHF (congestive heart failure) ([place])    CKD (chronic kidney disease)    Depression    Diabetes mellitus without complication ([place])    Dyspnea    Hypertension    Hypothyroidism    Liver cirrhosis secondary to NASH ([place])    NASH (nonalcoholic  steatohepatitis)    Pre-diabetes    Spleen enlarged    Thrombocytopenia ([place])     Past Surgical History:  Procedure Laterality Date   Bilateral hernia surgery     2006, 2007   CHOLECYSTECTOMY     2016    COLONOSCOPY WITH PROPOFOL N/A 06/28/2021   Procedure: COLONOSCOPY WITH PROPOFOL;  Surgeon: Rogene Houston, MD;  [facility];  Service: Endoscopy;  Laterality: N/A;  1020   ESOPHAGEAL DILATION N/A 11/06/2019   Procedure: ESOPHAGEAL DILATION;  Surgeon: Harvel Quale, MD;  Location: [facility];  Service: Gastroenterology;  Laterality: N/A;   ESOPHAGOGASTRODUODENOSCOPY (EGD) WITH PROPOFOL N/A 11/06/2019   Procedure: ESOPHAGOGASTRODUODENOSCOPY (EGD) WITH PROPOFOL;  Surgeon: Harvel Quale, MD;  Location: [facility];  Service: Gastroenterology;  Laterality: N/A;  [place] removed     in January of 2018.    POLYPECTOMY  06/28/2021   Procedure: POLYPECTOMY INTESTINAL;  Surgeon: Rogene Houston, MD;  Location: [facility];  Service: Endoscopy;;    Current Medications: Current Meds  Medication Sig   acetaminophen (TYLENOL) 500 MG tablet Take 1,000 mg by mouth every 6 (six) hours as needed for moderate pain.   albuterol (VENTOLIN HFA) 108 (90 Base) MCG/ACT inhaler TAKE 2 PUFFS BY MOUTH EVERY 6 HOURS AS NEEDED FOR WHEEZE OR SHORTNESS OF BREATH   carvedilol (COREG) 12.5 MG tablet Take 12.5 mg by mouth 2 (two) times daily with a meal.   furosemide (LASIX) 20 MG tablet Take 20 mg by mouth daily. Take 20 mg in the evenings of every Sunday, Monday, Wednesday,Thursday and Friday   furosemide (LASIX) 40 MG tablet Take 1 tablet (40 mg total) by mouth daily. (Patient taking differently: Take 80 mg by mouth daily. Take 80 mg every morning 20 mg every afternoon except for every Tuesday and Saturday take 80 mg 2 times a day)   gabapentin (NEURONTIN) 100 MG capsule Take 100 mg by mouth at bedtime.   hydrOXYzine  (VISTARIL) 50 MG capsule Take 50 mg by mouth in the morning and at bedtime.   lactulose (CHRONULAC) 10 GM/15ML solution Take 30 g by mouth daily as needed for moderate constipation.   levothyroxine (SYNTHROID) 175 MCG tablet Take 175 mcg by mouth daily before breakfast.   metolazone (ZAROXOLYN) 5 MG tablet Take 5 mg by mouth every other day.   ondansetron (ZOFRAN) 8 MG tablet Take 8 mg by mouth every 8 (eight) hours as needed for nausea or vomiting.   pantoprazole (PROTONIX) 40 MG tablet Take 40 mg by mouth daily.   polyethylene glycol-electrolytes (TRILYTE) 420 g solution Take 4,000 mLs by mouth as directed.   potassium  chloride SA (KLOR-CON M) 20 MEQ tablet Take 1 tablet (20 mEq total) by mouth 2 (two) times daily. (Patient taking differently: Take 20 mEq by mouth 2 (two) times daily. Take 40 meq 2 times a day every Tuesday and Saturday when Lasix is taken 2 times a day and 20 meq 2 times a day on the other 5 days)   rifaximin (XIFAXAN) 550 MG TABS tablet Take 550 mg by mouth 2 (two) times daily.   sertraline (ZOLOFT) 50 MG tablet Take 50 mg by mouth at bedtime.   spironolactone (ALDACTONE) 25 MG tablet Take 1 tablet (25 mg total) by mouth daily in the afternoon.   traZODone (DESYREL) 50 MG tablet Take 50 mg by mouth at bedtime.   [DISCONTINUED] eplerenone (INSPRA) 50 MG tablet TAKE 1 TABLET BY MOUTH EVERY DAY   [DISCONTINUED] SPIRONOLACTONE PO Take 25 mg by mouth daily in the afternoon.   Current Facility-Administered Medications for the 12/27/21 encounter (Office Visit) with Almyra Deforest, PA  Medication   albumin human 25 % solution 50 g     Allergies:   Patient has no known allergies.   Social History   Socioeconomic History   Marital status: Single    Spouse name: Not on file   Number of children: Not on file   Years of education: Not on file   Highest education level: Not on file  Occupational History   Not on file  Tobacco Use   Smoking status: Former    Packs/day: 1.00     Years: 20.00    Total pack years: 20.00    Types: Cigarettes    Quit date: 2017    Years since quitting: 6.7   Smokeless tobacco: Never  Vaping Use   Vaping Use: Never used  Substance and Sexual Activity   Alcohol use: No   Drug use: No   Sexual activity: Not on file  Other Topics Concern   Not on file  Social History Narrative   Not on file   Social Determinants of Health   Financial Resource Strain: Not on file  Food Insecurity: Not on file  Transportation Needs: Not on file  Physical Activity: Not on file  Stress: Not on file  Social Connections: Not on file     Family History: The patient's family history includes Aneurysm in his sister; Heart disease in his mother; Liver cancer in his mother.  ROS:   Please see the history of present illness.     All other systems reviewed and are negative.  EKGs/Labs/Other Studies Reviewed:    The following studies were reviewed today:  Echo 08/31/2020  1. Left ventricular ejection fraction, by estimation, is 70 to 75%. The  left ventricle has hyperdynamic function. The left ventricle has no  regional wall motion abnormalities. The left ventricular internal cavity  size was mildly dilated. There is mild  left ventricular hypertrophy. Left ventricular diastolic parameters were  normal.   2. Right ventricular systolic function is normal. The right ventricular  size is normal.   3. The mitral valve is normal in structure. Trivial mitral valve  regurgitation.   4. The aortic valve is normal in structure. Aortic valve regurgitation is  not visualized.   5. The inferior vena cava is dilated in size with >50% respiratory  variability, suggesting right atrial pressure of 8 mmHg.  EKG:  EKG is not ordered today.    Recent Labs: 09/20/2021: ALT 24; TSH 1.209 12/25/2021: Magnesium 2.1 12/27/2021: BUN 14; Creatinine, Ser  1.29; Hemoglobin 12.3; Platelets 63; Potassium 4.7; Sodium 143  Recent Lipid Panel No results found for: "CHOL",  "TRIG", "HDL", "CHOLHDL", "VLDL", "LDLCALC", "LDLDIRECT"   Risk Assessment/Calculations:           Physical Exam:    VS:  BP (!) 144/78 (BP Location: Left Arm)   Pulse 70   Wt (!) 385 lb 6.4 oz (174.8 kg)   SpO2 99%   BMI 49.48 kg/m        Wt Readings from Last 3 Encounters:  12/27/21 (!) 385 lb 6.4 oz (174.8 kg)  12/26/21 (!) 382 lb (173.3 kg)  12/18/21 (!) 378 lb 6.4 oz (171.6 kg)     GEN:  Well nourished, well developed in no acute distress HEENT: Normal NECK: No JVD; No carotid bruits LYMPHATICS: No lymphadenopathy CARDIAC: RRR, no murmurs, rubs, gallops RESPIRATORY:  Clear to auscultation without rales, wheezing or rhonchi  ABDOMEN: Soft, non-tender, non-distended MUSCULOSKELETAL:  1-2+ edema; No deformity  SKIN: Warm and dry NEUROLOGIC:  Alert and oriented x 3 PSYCHIATRIC:  Normal affect   ASSESSMENT:    1. Dyspnea, unspecified type   2. Preprocedural cardiovascular examination   3. Chronic diastolic heart failure ([place])   4. Liver cirrhosis secondary to NASH [facility])    PLAN:    In order of problems listed above:  Dyspnea: I suspect his dyspnea is multifactorial related to morbid obesity, pulmonary issue (with history of severe obstructive disease seen on previous PFT) and diastolic heart failure.  During the previous visit, I attempted to increase his diuretic for a few days, he did notice increased urine output however his weight actually increased when compared to the previous office visit.  I am not confident how compliant he is with his medications.  I discussed his case with Dr. Gardiner Rhyme, patient was previously followed by heart failure clinic at [facility], I think it is prudent for [place] to figure out how much of his shortness of breath is truly related to volume overload.  We recommend a right heart cath.  He was agreeable to proceed.  Dr. Gardiner Rhyme also recommended referral to heart failure service, if diastolic heart failure remain an issue in the future, he Goldfar[place] be  a candidate for CardioMEMS device  Chronic diastolic heart failure: I recently seen the patient for volume overload and the shortness of breath, although some of his shortness of breath is likely related to morbid obesity and pulmonary issue.  He does appears to be volume overloaded based on physical exam.  I increase his diuretic for a few days, however this did not result in significant weight loss.  In fact his weight is actually higher than last office visit.  He is currently taking 5 mg daily of metolazone, he is also taking 80 mg a.m. and 20 mg p.m. of Lasix.  I wanted to increase the Lasix permanently to 80 mg twice a day, however he was hesitant to do so as he did not wish to go to the bathroom frequently.  See #1, eventually we recommended right heart cath to further assess his breathing issue.  NASH cirrhosis: On lactulose and rifaximin  Thrombocytopenia: He has a history of thrombocytopenia.  Baseline platelet level around 60-70K.      Shared Decision Making/Informed Consent= The risks [stroke (1 in 1000), death (1 in 1000), kidney failure [usually temporary] (1 in 500), bleeding (1 in 200), allergic reaction [possibly serious] (1 in 200)], benefits (diagnostic support and management of coronary artery disease) and alternatives of  a cardiac catheterization were discussed in detail with Mr. Zogg and he is willing to proceed.    Medication Adjustments/Labs and Tests Ordered: Current medicines are reviewed at length with the patient today.  Concerns regarding medicines are outlined above.  Orders Placed This Encounter  Procedures   CBC   Basic metabolic panel   AMB referral to CHF clinic   Meds ordered this encounter  Medications   spironolactone (ALDACTONE) 25 MG tablet    Sig: Take 1 tablet (25 mg total) by mouth daily in the afternoon.    Dispense:  90 tablet    Refill:  3    Patient Instructions  Medication Instructions:  TAKE Spironolactone 25 mg daily, TAKE Lasix 80 mg  every morning 20 mg every afternoon except for every Tuesday and Saturday take 80 mg 2 times a day INCREASE Potassium to 40 meq 2 times a day every Tuesday and Saturday when Lasix is taken 80 mg 2 times a day TAKE Potassium 20 meq 2 times a day every Sunday, Monday, Wednesday, Thursday and Friday.  *If you need a refill on your cardiac medications before your next appointment, please call your pharmacy*  Lab Work: Your physician recommends that you return for lab work TODAY:  BMP CBC  If you have labs (blood work) drawn today and your tests are completely normal, you will receive your results only by: [place] (if you have MyChart) OR A paper copy in the mail If you have any lab test that is abnormal or we need to change your treatment, we will call you to review the results.   Testing/Procedures: Almyra Deforest, PA-C has requested that you have a cardiac catheterization. Cardiac catheterization is used to diagnose and/or treat various heart conditions. Doctors Beg recommend this procedure for a number of different reasons. The most common reason is to evaluate chest pain. Chest pain can be a symptom of coronary artery disease (CAD), and cardiac catheterization can show whether plaque is narrowing or blocking your heart's arteries. This procedure is also used to evaluate the valves, as well as measure the blood flow and oxygen levels in different parts of your heart. For further information please visit [email or web address]. Please follow instruction sheet, as given.  Follow-Up: At [facility], you and your health needs are our priority.  As part of our continuing mission to provide you with exceptional heart care, we have created designated Provider Care Teams.  These Care Teams include your primary Cardiologist (physician) and Advanced Practice Providers (APPs -  Physician Assistants and Nurse Practitioners) who all work together to provide you with the care you need, when you need  it.  We recommend signing up for the patient portal called "MyChart".  Sign up information is provided on this After Visit Summary.  MyChart is used to connect with patients for Virtual Visits (Telemedicine).  Patients are able to view lab/test results, encounter notes, upcoming appointments, etc.  Non-urgent messages can be sent to your provider as well.   To learn more about what you can do with MyChart, go to [email or web address].    Your next appointment:   3 month(s)  The format for your next appointment:   In Person  Provider:   Rozann Lesches, MD   Other Instructions You have been referred to Heart Failure clinic         Cardiac/Peripheral Catheterization   You are scheduled for a Cardiac Catheterization on Monday, October 16 with Dr. Larae Grooms.  1. Please arrive  at the Main Entrance A at [facility]: [place], [place] [address] on October 16 at 8:30 AM (This time is two hours before your procedure to ensure your preparation). Free valet parking service is available. You will check in at ADMITTING. The support person will be asked to wait in the waiting room.  It is OK to have someone drop you off and come back when you are ready to be discharged.        Special note: Every effort is made to have your procedure done on time. Please understand that emergencies sometimes delay scheduled procedures.   . 2. Diet: Do not eat solid foods after midnight.  You [place] have clear liquids until 5 AM the day of the procedure.  3. Labs: You will need to have blood drawn on Wednesday, October 4 at [place]  Open: [place] (Lunch 12:30 - 1:30)   Phone: [phone]. You do not need to be fasting.  4. Medication instructions in preparation for your procedure:   Contrast Allergy: No    On the morning of your procedure, take Aspirin 81 mg and any morning medicines NOT listed above.  You [place] use sips of water.  5.  Plan to go home the same day, you will only stay overnight if medically necessary. 6. You MUST have a responsible adult to drive you home. 7. An adult MUST be with you the first 24 hours after you arrive home. 8. Bring a current list of your medications, and the last time and date medication taken. 9. Bring ID and current insurance cards. 10.Please wear clothes that are easy to get on and off and wear slip-on shoes.  Thank you for allowing [place] to care for you!   -- [facility] Invasive Cardiovascular services   Important Information About Sugar         Hilbert Corrigan, [place]  12/28/2021 10:55 PM    [place]

## 2021-12-28 ENCOUNTER — Other Ambulatory Visit: Payer: Self-pay | Admitting: Physician Assistant

## 2021-12-28 ENCOUNTER — Encounter: Payer: Self-pay | Admitting: Physician Assistant

## 2021-12-28 LAB — CBC
Hematocrit: 36.5 % — ABNORMAL LOW (ref 37.5–51.0)
Hemoglobin: 12.3 g/dL — ABNORMAL LOW (ref 13.0–17.7)
MCH: 29.1 pg (ref 26.6–33.0)
MCHC: 33.7 g/dL (ref 31.5–35.7)
MCV: 87 fL (ref 79–97)
Platelets: 63 10*3/uL — CL (ref 150–450)
RBC: 4.22 x10E6/uL (ref 4.14–5.80)
RDW: 15.6 % — ABNORMAL HIGH (ref 11.6–15.4)
WBC: 2.7 10*3/uL — ABNORMAL LOW (ref 3.4–10.8)

## 2021-12-28 LAB — BASIC METABOLIC PANEL
BUN/Creatinine Ratio: 11 (ref 10–24)
BUN: 14 mg/dL (ref 8–27)
CO2: 26 mmol/L (ref 20–29)
Calcium: 9.3 mg/dL (ref 8.6–10.2)
Chloride: 105 mmol/L (ref 96–106)
Creatinine, Ser: 1.29 mg/dL — ABNORMAL HIGH (ref 0.76–1.27)
Glucose: 105 mg/dL — ABNORMAL HIGH (ref 70–99)
Potassium: 4.7 mmol/L (ref 3.5–5.2)
Sodium: 143 mmol/L (ref 134–144)
eGFR: 62 mL/min/{1.73_m2} (ref >59)

## 2021-12-28 MED ORDER — SODIUM CHLORIDE 0.9% FLUSH: 3.0000 mL | Freq: Two times a day (BID) | INTRAVENOUS | Status: DC

## 2021-12-28 NOTE — H&P (View-Only) (Signed)
Discussed with cath lab nurse, although current platelet level is low, however high enough to undergo right heart cath.

## 2021-12-28 NOTE — Progress Notes (Signed)
Discussed with cath lab nurse, although current platelet level is low, however high enough to undergo right heart cath.

## 2022-01-04 ENCOUNTER — Telehealth: Payer: Self-pay | Admitting: *Deleted

## 2022-01-04 NOTE — Telephone Encounter (Addendum)
Right Heart Cath scheduled at [facility] for: Monday January 08, 2022 10:30 AM Arrival time and place: [facility] Main Entrance A at: 8:30 AM  Nothing to eat after midnight prior to procedure, clear liquids until 5 AM day of procedure.  Medication instructions: -Hold:  Furosemide/Spironolactone/metolazone/KCl-AM of procedure -Except hold medications usual morning medications can be taken with sips of water.   Confirmed patient has responsible adult to drive home post procedure and be with patient first 24 hours after arriving home.  Patient reports no new symptoms concerning for COVID-19 in the past 10 days.  Reviewed procedure instructions with patient.

## 2022-01-08 ENCOUNTER — Other Ambulatory Visit: Payer: Self-pay

## 2022-01-08 ENCOUNTER — Encounter (HOSPITAL_COMMUNITY): Payer: Self-pay | Admitting: Interventional Cardiology

## 2022-01-08 ENCOUNTER — Ambulatory Visit (HOSPITAL_COMMUNITY)
Admission: RE | Admit: 2022-01-08 | Discharge: 2022-01-08 | Disposition: A | Discharge status: Home / Self Care | Payer: Medicare HMO | Attending: Interventional Cardiology | Admitting: Interventional Cardiology

## 2022-01-08 ENCOUNTER — Ambulatory Visit (HOSPITAL_COMMUNITY)
Admission: RE | Disposition: A | Discharge status: Home / Self Care | Payer: Self-pay | Source: Home / Self Care | Attending: Interventional Cardiology

## 2022-01-08 DIAGNOSIS — R0602 Shortness of breath: Secondary | ICD-10-CM | POA: Insufficient documentation

## 2022-01-08 DIAGNOSIS — I272 Pulmonary hypertension, unspecified: Secondary | ICD-10-CM | POA: Diagnosis not present

## 2022-01-08 DIAGNOSIS — I5032 Chronic diastolic (congestive) heart failure: Secondary | ICD-10-CM

## 2022-01-08 HISTORY — PX: RIGHT HEART CATH: CATH118263

## 2022-01-08 LAB — POCT I-STAT EG7
Acid-Base Excess: 1 mmol/L (ref 0.0–2.0)
Acid-Base Excess: 1 mmol/L (ref 0.0–2.0)
Bicarbonate: 26.1 mmol/L (ref 20.0–28.0)
Bicarbonate: 26.4 mmol/L (ref 20.0–28.0)
Calcium, Ion: 1.18 mmol/L (ref 1.15–1.40)
Calcium, Ion: 1.18 mmol/L (ref 1.15–1.40)
HCT: 35 % — ABNORMAL LOW (ref 39.0–52.0)
HCT: 35 % — ABNORMAL LOW (ref 39.0–52.0)
Hemoglobin: 11.9 g/dL — ABNORMAL LOW (ref 13.0–17.0)
Hemoglobin: 11.9 g/dL — ABNORMAL LOW (ref 13.0–17.0)
O2 Saturation: 68 %
O2 Saturation: 71 %
Potassium: 4.1 mmol/L (ref 3.5–5.1)
Potassium: 4.1 mmol/L (ref 3.5–5.1)
Sodium: 139 mmol/L (ref 135–145)
Sodium: 140 mmol/L (ref 135–145)
TCO2: 27 mmol/L (ref 22–32)
TCO2: 28 mmol/L (ref 22–32)
pCO2, Ven: 42.4 mmHg — ABNORMAL LOW (ref 44–60)
pCO2, Ven: 42.9 mmHg — ABNORMAL LOW (ref 44–60)
pH, Ven: 7.392 (ref 7.25–7.43)
pH, Ven: 7.401 (ref 7.25–7.43)
pO2, Ven: 35 mmHg (ref 32–45)
pO2, Ven: 38 mmHg (ref 32–45)

## 2022-01-08 SURGERY — RIGHT HEART CATH

## 2022-01-08 MED ORDER — ASPIRIN 81 MG PO CHEW: 81.0000 mg | CHEWABLE_TABLET | ORAL | Status: DC

## 2022-01-08 MED ORDER — LIDOCAINE HCL (PF) 1 % IJ SOLN
INTRAMUSCULAR | Status: AC
  Filled 2022-01-08: qty 30

## 2022-01-08 MED ORDER — LABETALOL HCL 5 MG/ML IV SOLN: 10.0000 mg | INTRAVENOUS | Status: DC | PRN

## 2022-01-08 MED ORDER — SODIUM CHLORIDE 0.9 % IV SOLN: INTRAVENOUS | Status: DC

## 2022-01-08 MED ORDER — SODIUM CHLORIDE 0.9% FLUSH: 3.0000 mL | INTRAVENOUS | Status: DC | PRN

## 2022-01-08 MED ORDER — ACETAMINOPHEN 325 MG PO TABS: 650.0000 mg | ORAL_TABLET | ORAL | Status: DC | PRN

## 2022-01-08 MED ORDER — HEPARIN (PORCINE) IN NACL 1000-0.9 UT/500ML-% IV SOLN
INTRAVENOUS | Status: DC | PRN
  Administered 2022-01-08: 500 mL

## 2022-01-08 MED ORDER — HEPARIN (PORCINE) IN NACL 1000-0.9 UT/500ML-% IV SOLN
INTRAVENOUS | Status: AC
  Filled 2022-01-08: qty 1000

## 2022-01-08 MED ORDER — LIDOCAINE HCL (PF) 1 % IJ SOLN
INTRAMUSCULAR | Status: DC | PRN
  Administered 2022-01-08: 2 mL

## 2022-01-08 MED ORDER — HYDRALAZINE HCL 20 MG/ML IJ SOLN: 10.0000 mg | INTRAMUSCULAR | Status: DC | PRN

## 2022-01-08 MED ORDER — SODIUM CHLORIDE 0.9 % IV SOLN: 250.0000 mL | INTRAVENOUS | Status: DC | PRN

## 2022-01-08 MED ORDER — SODIUM CHLORIDE 0.9% FLUSH: 3.0000 mL | Freq: Two times a day (BID) | INTRAVENOUS | Status: DC

## 2022-01-08 SURGICAL SUPPLY — 8 items
CATH BALLN WEDGE 5F 110CM (CATHETERS) IMPLANT
PACK CARDIAC CATHETERIZATION (CUSTOM PROCEDURE TRAY) IMPLANT
PROTECTION STATION PRESSURIZED (MISCELLANEOUS) ×1
SHEATH GLIDE SLENDER 4/5FR (SHEATH) IMPLANT
STATION PROTECTION PRESSURIZED (MISCELLANEOUS) IMPLANT
TRANSDUCER W/STOPCOCK (MISCELLANEOUS) IMPLANT
TUBING ART PRESS 72  MALE/FEM (TUBING) ×1
TUBING ART PRESS 72 MALE/FEM (TUBING) IMPLANT

## 2022-01-08 NOTE — Interval H&P Note (Signed)
History and Physical Interval Note:  01/08/2022 11:21 AM  Daniel Crawford  has presented today for surgery, with the diagnosis of shortness of breath.  The various methods of treatment have been discussed with the patient and family. After consideration of risks, benefits and other options for treatment, the patient has consented to  Procedure(s): RIGHT HEART CATH (N/A) as a surgical intervention.  The patient's history has been reviewed, patient examined, no change in status, stable for surgery.  I have reviewed the patient's chart and labs.  Questions were answered to the patient's satisfaction.     Larae Grooms

## 2022-01-10 MED FILL — Heparin Sod (Porcine)-NaCl IV Soln 1000 Unit/500ML-0.9%: INTRAVENOUS | Qty: 500 | Status: AC

## 2022-01-11 DIAGNOSIS — Z79899 Other long term (current) drug therapy: Secondary | ICD-10-CM | POA: Diagnosis not present

## 2022-01-11 DIAGNOSIS — Z23 Encounter for immunization: Secondary | ICD-10-CM | POA: Diagnosis not present

## 2022-01-11 DIAGNOSIS — R609 Edema, unspecified: Secondary | ICD-10-CM | POA: Diagnosis not present

## 2022-01-11 DIAGNOSIS — K7460 Unspecified cirrhosis of liver: Secondary | ICD-10-CM | POA: Diagnosis not present

## 2022-01-11 DIAGNOSIS — I509 Heart failure, unspecified: Secondary | ICD-10-CM | POA: Diagnosis not present

## 2022-01-11 DIAGNOSIS — E039 Hypothyroidism, unspecified: Secondary | ICD-10-CM | POA: Diagnosis not present

## 2022-01-22 ENCOUNTER — Other Ambulatory Visit: Payer: Self-pay

## 2022-01-22 ENCOUNTER — Encounter (HOSPITAL_COMMUNITY): Payer: Self-pay

## 2022-01-22 ENCOUNTER — Emergency Department (HOSPITAL_COMMUNITY): Payer: Medicare HMO

## 2022-01-22 ENCOUNTER — Inpatient Hospital Stay (HOSPITAL_COMMUNITY)
Admission: EM | Admit: 2022-01-22 | Discharge: 2022-01-25 | DRG: 432 | Disposition: A | Discharge status: Home / Self Care | Payer: Medicare HMO | Attending: Family Medicine | Admitting: Family Medicine

## 2022-01-22 DIAGNOSIS — R188 Other ascites: Secondary | ICD-10-CM | POA: Diagnosis present

## 2022-01-22 DIAGNOSIS — R7989 Other specified abnormal findings of blood chemistry: Secondary | ICD-10-CM

## 2022-01-22 DIAGNOSIS — R0602 Shortness of breath: Secondary | ICD-10-CM | POA: Diagnosis not present

## 2022-01-22 DIAGNOSIS — E46 Unspecified protein-calorie malnutrition: Secondary | ICD-10-CM

## 2022-01-22 DIAGNOSIS — N3289 Other specified disorders of bladder: Secondary | ICD-10-CM | POA: Diagnosis not present

## 2022-01-22 DIAGNOSIS — R54 Age-related physical debility: Secondary | ICD-10-CM | POA: Diagnosis present

## 2022-01-22 DIAGNOSIS — J45909 Unspecified asthma, uncomplicated: Secondary | ICD-10-CM | POA: Diagnosis present

## 2022-01-22 DIAGNOSIS — R601 Generalized edema: Secondary | ICD-10-CM | POA: Diagnosis not present

## 2022-01-22 DIAGNOSIS — K3189 Other diseases of stomach and duodenum: Secondary | ICD-10-CM | POA: Diagnosis not present

## 2022-01-22 DIAGNOSIS — D696 Thrombocytopenia, unspecified: Secondary | ICD-10-CM | POA: Diagnosis present

## 2022-01-22 DIAGNOSIS — R6883 Chills (without fever): Secondary | ICD-10-CM | POA: Diagnosis not present

## 2022-01-22 DIAGNOSIS — Z87891 Personal history of nicotine dependence: Secondary | ICD-10-CM

## 2022-01-22 DIAGNOSIS — K7581 Nonalcoholic steatohepatitis (NASH): Secondary | ICD-10-CM | POA: Diagnosis present

## 2022-01-22 DIAGNOSIS — E876 Hypokalemia: Secondary | ICD-10-CM | POA: Diagnosis not present

## 2022-01-22 DIAGNOSIS — E8770 Fluid overload, unspecified: Secondary | ICD-10-CM

## 2022-01-22 DIAGNOSIS — I130 Hypertensive heart and chronic kidney disease with heart failure and stage 1 through stage 4 chronic kidney disease, or unspecified chronic kidney disease: Secondary | ICD-10-CM | POA: Diagnosis present

## 2022-01-22 DIAGNOSIS — R161 Splenomegaly, not elsewhere classified: Secondary | ICD-10-CM | POA: Diagnosis present

## 2022-01-22 DIAGNOSIS — F32A Depression, unspecified: Secondary | ICD-10-CM | POA: Diagnosis present

## 2022-01-22 DIAGNOSIS — F419 Anxiety disorder, unspecified: Secondary | ICD-10-CM | POA: Diagnosis present

## 2022-01-22 DIAGNOSIS — N189 Chronic kidney disease, unspecified: Secondary | ICD-10-CM | POA: Diagnosis present

## 2022-01-22 DIAGNOSIS — Z800 Family history of malignant neoplasm of digestive organs: Secondary | ICD-10-CM

## 2022-01-22 DIAGNOSIS — R634 Abnormal weight loss: Secondary | ICD-10-CM | POA: Diagnosis not present

## 2022-01-22 DIAGNOSIS — I272 Pulmonary hypertension, unspecified: Secondary | ICD-10-CM | POA: Diagnosis present

## 2022-01-22 DIAGNOSIS — Z8249 Family history of ischemic heart disease and other diseases of the circulatory system: Secondary | ICD-10-CM

## 2022-01-22 DIAGNOSIS — Z9049 Acquired absence of other specified parts of digestive tract: Secondary | ICD-10-CM

## 2022-01-22 DIAGNOSIS — Z79890 Hormone replacement therapy (postmenopausal): Secondary | ICD-10-CM

## 2022-01-22 DIAGNOSIS — E860 Dehydration: Secondary | ICD-10-CM | POA: Diagnosis not present

## 2022-01-22 DIAGNOSIS — K7469 Other cirrhosis of liver: Principal | ICD-10-CM | POA: Diagnosis present

## 2022-01-22 DIAGNOSIS — N200 Calculus of kidney: Secondary | ICD-10-CM | POA: Diagnosis not present

## 2022-01-22 DIAGNOSIS — Z79899 Other long term (current) drug therapy: Secondary | ICD-10-CM

## 2022-01-22 DIAGNOSIS — I10 Essential (primary) hypertension: Secondary | ICD-10-CM

## 2022-01-22 DIAGNOSIS — I5033 Acute on chronic diastolic (congestive) heart failure: Secondary | ICD-10-CM | POA: Diagnosis present

## 2022-01-22 DIAGNOSIS — K449 Diaphragmatic hernia without obstruction or gangrene: Secondary | ICD-10-CM | POA: Diagnosis not present

## 2022-01-22 DIAGNOSIS — K7460 Unspecified cirrhosis of liver: Secondary | ICD-10-CM | POA: Diagnosis not present

## 2022-01-22 DIAGNOSIS — Z1152 Encounter for screening for COVID-19: Secondary | ICD-10-CM

## 2022-01-22 DIAGNOSIS — Z6841 Body Mass Index (BMI) 40.0 and over, adult: Secondary | ICD-10-CM

## 2022-01-22 DIAGNOSIS — E1122 Type 2 diabetes mellitus with diabetic chronic kidney disease: Secondary | ICD-10-CM | POA: Diagnosis present

## 2022-01-22 DIAGNOSIS — E039 Hypothyroidism, unspecified: Secondary | ICD-10-CM | POA: Diagnosis present

## 2022-01-22 DIAGNOSIS — R6889 Other general symptoms and signs: Principal | ICD-10-CM

## 2022-01-22 DIAGNOSIS — F418 Other specified anxiety disorders: Secondary | ICD-10-CM | POA: Diagnosis present

## 2022-01-22 DIAGNOSIS — R531 Weakness: Secondary | ICD-10-CM

## 2022-01-22 HISTORY — DX: Other specified abnormal findings of blood chemistry: R79.89

## 2022-01-22 LAB — HEPATIC FUNCTION PANEL
ALT: 16 U/L (ref 0–44)
AST: 40 U/L (ref 15–41)
Albumin: 3 g/dL — ABNORMAL LOW (ref 3.5–5.0)
Alkaline Phosphatase: 80 U/L (ref 38–126)
Bilirubin, Direct: 1.1 mg/dL — ABNORMAL HIGH (ref 0.0–0.2)
Indirect Bilirubin: 2.4 mg/dL — ABNORMAL HIGH (ref 0.3–0.9)
Total Bilirubin: 3.5 mg/dL — ABNORMAL HIGH (ref 0.3–1.2)
Total Protein: 6.8 g/dL (ref 6.5–8.1)

## 2022-01-22 LAB — CBC
HCT: 35.3 % — ABNORMAL LOW (ref 39.0–52.0)
Hemoglobin: 11.6 g/dL — ABNORMAL LOW (ref 13.0–17.0)
MCH: 29 pg (ref 26.0–34.0)
MCHC: 32.9 g/dL (ref 30.0–36.0)
MCV: 88.3 fL (ref 80.0–100.0)
Platelets: 95 10*3/uL — ABNORMAL LOW (ref 150–400)
RBC: 4 MIL/uL — ABNORMAL LOW (ref 4.22–5.81)
RDW: 17.3 % — ABNORMAL HIGH (ref 11.5–15.5)
WBC: 4.3 10*3/uL (ref 4.0–10.5)
nRBC: 0 % (ref 0.0–0.2)

## 2022-01-22 LAB — TROPONIN I (HIGH SENSITIVITY)
Troponin I (High Sensitivity): 6 ng/L (ref <18)
Troponin I (High Sensitivity): 7 ng/L (ref <18)

## 2022-01-22 LAB — CK: Total CK: 117 U/L (ref 49–397)

## 2022-01-22 LAB — BASIC METABOLIC PANEL
Anion gap: 8 (ref 5–15)
BUN: 10 mg/dL (ref 8–23)
CO2: 25 mmol/L (ref 22–32)
Calcium: 8.6 mg/dL — ABNORMAL LOW (ref 8.9–10.3)
Chloride: 103 mmol/L (ref 98–111)
Creatinine, Ser: 1.15 mg/dL (ref 0.61–1.24)
GFR, Estimated: >60 mL/min (ref >60)
Glucose, Bld: 172 mg/dL — ABNORMAL HIGH (ref 70–99)
Potassium: 3.6 mmol/L (ref 3.5–5.1)
Sodium: 136 mmol/L (ref 135–145)

## 2022-01-22 LAB — RESP PANEL BY RT-PCR (FLU A&B, COVID) ARPGX2
Influenza A by PCR: NEGATIVE
Influenza B by PCR: NEGATIVE
SARS Coronavirus 2 by RT PCR: NEGATIVE

## 2022-01-22 LAB — MAGNESIUM: Magnesium: 2 mg/dL (ref 1.7–2.4)

## 2022-01-22 LAB — D-DIMER, QUANTITATIVE: D-Dimer, Quant: 19.85 ug/mL-FEU — ABNORMAL HIGH (ref 0.00–0.50)

## 2022-01-22 LAB — BRAIN NATRIURETIC PEPTIDE: B Natriuretic Peptide: 58 pg/mL (ref 0.0–100.0)

## 2022-01-22 LAB — TSH: TSH: 5.431 u[IU]/mL — ABNORMAL HIGH (ref 0.350–4.500)

## 2022-01-22 MED ORDER — IOHEXOL 350 MG/ML SOLN
100.0000 mL | Freq: Once | INTRAVENOUS | Status: AC | PRN
  Administered 2022-01-22: 100 mL via INTRAVENOUS

## 2022-01-22 MED ORDER — POTASSIUM CHLORIDE 20 MEQ PO PACK
40.0000 meq | PACK | Freq: Once | ORAL | Status: AC
  Administered 2022-01-23: 40 meq via ORAL
  Filled 2022-01-22: qty 2

## 2022-01-22 MED ORDER — ACETAMINOPHEN 325 MG PO TABS
650.0000 mg | ORAL_TABLET | Freq: Once | ORAL | Status: AC
  Administered 2022-01-22: 650 mg via ORAL
  Filled 2022-01-22: qty 2

## 2022-01-22 MED ORDER — FUROSEMIDE 10 MG/ML IJ SOLN
80.0000 mg | Freq: Once | INTRAMUSCULAR | Status: AC
  Administered 2022-01-23: 80 mg via INTRAVENOUS
  Filled 2022-01-22: qty 8

## 2022-01-22 NOTE — ED Notes (Signed)
Patient transported to CT 

## 2022-01-22 NOTE — ED Triage Notes (Addendum)
Pt states he had the covid and flu shot one week ago, since he has had decreased energy, generalized body aches, chills. States urine is dark and he thinks he is dehydrated. Pt states he is also short of breath that is worse with exertion.

## 2022-01-22 NOTE — ED Provider Notes (Signed)
[facility] EMERGENCY DEPARTMENT Provider Note   CSN: [id number] Arrival date & time: 01/22/22  1621     History {Add pertinent medical, surgical, social history, OB history to HPI:1} Chief Complaint  Patient presents with   Shortness of Breath    Daniel Crawford is a 66 y.o. male.   Shortness of Breath Associated symptoms: abdominal pain   Patient presents for abdominal pain, fatigue, generalized body aches, chills, and exertional shortness of breath.  Medical history includes hypothyroidism, cirrhosis, depression, thrombocytopenia, OSA, CHF.  Recent history includes heart cath 2 weeks ago, COVID and flu shot 1 week ago.  He has recently noticed color change to his urine.  He has had recent diarrhea.  He denies any color change to his stool.  He has had loss of appetite and poor p.o. intake.  He has not taken any of his medication, other than his Synthroid in the past 3 days.  Leg swelling is currently baseline.  He does feel that he has had worsened abdominal distention.  He denies any history of paracentesis.  Abdominal pain is located throughout the lower abdomen, especially in right lower quadrant.     Home Medications Prior to Admission medications   Medication Sig Start Date End Date Taking? Authorizing Provider  acetaminophen (TYLENOL) 500 MG tablet Take 1,000 mg by mouth every 6 (six) hours as needed for moderate pain.    [provider]  albuterol (VENTOLIN HFA) 108 (90 Base) MCG/ACT inhaler TAKE 2 PUFFS BY MOUTH EVERY 6 HOURS AS NEEDED FOR WHEEZE OR SHORTNESS OF BREATH 11/02/21   Rigoberto Noel, MD  carvedilol (COREG) 12.5 MG tablet Take 12.5 mg by mouth 2 (two) times daily with a meal.    [provider]  furosemide (LASIX) 20 MG tablet Take 80-100 mg by mouth See admin instructions. Take with 40 mg to equal a total of 80-100 mg twice a day every Sunday, Monday, Wednesday,Thursday and Friday    [provider]  furosemide (LASIX) 40 MG tablet Take 1  tablet (40 mg total) by mouth daily. Patient taking differently: Take 80 mg by mouth See admin instructions. Take 80 mg in the morning and 80 mg in the evening  on Tuesday and Saturday twice a day 04/23/21   Geradine Girt, DO  gabapentin (NEURONTIN) 100 MG capsule Take 100 mg by mouth daily as needed (pain). 02/20/21   [provider]  hydrOXYzine (VISTARIL) 50 MG capsule Take 50 mg by mouth 2 (two) times daily.    [provider]  lactulose (CHRONULAC) 10 GM/15ML solution Take 30 g by mouth daily as needed for moderate constipation. 11/06/21   [provider]  levothyroxine (SYNTHROID) 175 MCG tablet Take 175 mcg by mouth daily before breakfast.    [provider]  metolazone (ZAROXOLYN) 5 MG tablet Take 5 mg by mouth every other day.    [provider]  ondansetron (ZOFRAN) 8 MG tablet Take 8 mg by mouth every 8 (eight) hours as needed for nausea or vomiting.    [provider]  pantoprazole (PROTONIX) 40 MG tablet Take 40 mg by mouth daily.    [provider]  polyethylene glycol-electrolytes (TRILYTE) 420 g solution Take 4,000 mLs by mouth as directed. Patient not taking: Reported on 01/05/2022 11/20/21   Harvel Quale, MD  potassium chloride SA (KLOR-CON M) 20 MEQ tablet Take 1 tablet (20 mEq total) by mouth 2 (two) times daily. Patient taking differently: Take 40 mEq by mouth  2 (two) times daily. 09/20/21   Idol, Almyra Free, PA-C  rifaximin (XIFAXAN) 550 MG TABS tablet Take 550 mg by mouth 2 (two) times daily.    [provider]  sertraline (ZOLOFT) 50 MG tablet Take 50 mg by mouth at bedtime.    [provider]  spironolactone (ALDACTONE) 25 MG tablet Take 1 tablet (25 mg total) by mouth daily in the afternoon. 12/27/21   Almyra Deforest, PA  traZODone (DESYREL) 50 MG tablet Take 50 mg by mouth at bedtime.    [provider]      Allergies    Patient has no known allergies.    Review of Systems    Review of Systems  Constitutional:  Positive for activity change, appetite change and fatigue.  Respiratory:  Positive for shortness of breath.   Cardiovascular:  Positive for leg swelling (baseline).  Gastrointestinal:  Positive for abdominal distention, abdominal pain and diarrhea.  Genitourinary:        Dark urine  Musculoskeletal:  Positive for myalgias.  Neurological:  Positive for weakness (generalized).  All other systems reviewed and are negative.   Physical Exam Updated Vital Signs BP 138/78 (BP Location: Right Arm)   Pulse (!) 106   Temp 98.6 F (37 C) (Oral)   Resp (!) 22   Ht '6\' 2"'$  (1.88 m)   Wt (!) 170.1 kg   SpO2 96%   BMI 48.15 kg/m  Physical Exam Vitals and nursing note reviewed.  Constitutional:      General: He is not in acute distress.    Appearance: He is well-developed. He is obese. He is ill-appearing (chronically). He is not toxic-appearing or diaphoretic.  HENT:     Head: Normocephalic and atraumatic.     Mouth/Throat:     Mouth: Mucous membranes are moist.     Pharynx: Oropharynx is clear.  Eyes:     General: Scleral icterus present.     Conjunctiva/sclera: Conjunctivae normal.  Cardiovascular:     Rate and Rhythm: Normal rate and regular rhythm.     Heart sounds: No murmur heard. Pulmonary:     Effort: Pulmonary effort is normal. Tachypnea present. No accessory muscle usage or respiratory distress.     Breath sounds: Rales present. No decreased breath sounds, wheezing or rhonchi.  Abdominal:     General: There is distension.     Palpations: Abdomen is soft.     Tenderness: There is abdominal tenderness. There is no guarding or rebound.  Musculoskeletal:        General: No swelling.     Cervical back: Normal range of motion and neck supple.     Right lower leg: Edema present.     Left lower leg: Edema present.  Skin:    General: Skin is warm and dry.  Neurological:     General: No focal deficit present.     Mental Status: He is alert  and oriented to person, place, and time.  Psychiatric:        Mood and Affect: Mood normal.        Behavior: Behavior normal.     ED Results / Procedures / Treatments   Labs (all labs ordered are listed, but only abnormal results are displayed) Labs Reviewed  CBC - Abnormal; Notable for the following components:      Result Value   RBC 4.00 (*)    Hemoglobin 11.6 (*)    HCT 35.3 (*)    RDW 17.3 (*)    All  other components within normal limits  BASIC METABOLIC PANEL    EKG None  Radiology DG Chest 2 View  Result Date: 01/22/2022 CLINICAL DATA:  Shortness of breath. Body aches and chills. Dehydration. EXAM: CHEST - 2 VIEW COMPARISON:  Chest two views 11/30/2021 FINDINGS: Cardiac silhouette and mediastinal contours are within normal limits. The lungs are clear. No pleural effusion or pneumothorax. Minimal multilevel degenerative disc changes of the thoracic spine. IMPRESSION: No active cardiopulmonary disease. Electronically Signed   By: Yvonne Kendall M.D.   On: 01/22/2022 17:32    Procedures Procedures  {Document cardiac monitor, telemetry assessment procedure when appropriate:1}  Medications Ordered in ED Medications - No data to display  ED Course/ Medical Decision Making/ A&P                           Medical Decision Making Amount and/or Complexity of Data Reviewed Labs: ordered. Radiology: ordered.  Risk OTC drugs. Prescription drug management.   This patient presents to the ED for concern of ***, this involves an extensive number of treatment options, and is a complaint that carries with it a high risk of complications and morbidity.  The differential diagnosis includes ***   Co morbidities that complicate the patient evaluation  ***   Additional history obtained:  Additional history obtained from *** External records from outside source obtained and reviewed including ***   Lab Tests:  I Ordered, and personally interpreted labs.  The pertinent  results include:  ***   Imaging Studies ordered:  I ordered imaging studies including ***  I independently visualized and interpreted imaging which showed *** I agree with the radiologist interpretation   Cardiac Monitoring: / EKG:  The patient was maintained on a cardiac monitor.  I personally viewed and interpreted the cardiac monitored which showed an underlying rhythm of: ***   Consultations Obtained:  I requested consultation with the ***,  and discussed lab and imaging findings as well as pertinent plan - they recommend: ***   Problem List / ED Course / Critical interventions / Medication management  *** I ordered medication including ***  for ***  Reevaluation of the patient after these medicines showed that the patient {resolved/improved/worsened:23923::"improved"} I have reviewed the patients home medicines and have made adjustments as needed   Social Determinants of Health:  ***   Test / Admission - Considered:  ***   {Document critical care time when appropriate:1} {Document review of labs and clinical decision tools ie heart score, Chads2Vasc2 etc:1}  {Document your independent review of radiology images, and any outside records:1} {Document your discussion with family members, caretakers, and with consultants:1} {Document social determinants of health affecting pt's care:1} {Document your decision making why or why not admission, treatments were needed:1} Final Clinical Impression(s) / ED Diagnoses Final diagnoses:  None    Rx / DC Orders ED Discharge Orders     None

## 2022-01-23 ENCOUNTER — Observation Stay (HOSPITAL_COMMUNITY): Payer: Medicare HMO

## 2022-01-23 ENCOUNTER — Encounter (HOSPITAL_COMMUNITY): Payer: Self-pay | Admitting: Family Medicine

## 2022-01-23 DIAGNOSIS — I10 Essential (primary) hypertension: Secondary | ICD-10-CM | POA: Diagnosis not present

## 2022-01-23 DIAGNOSIS — E46 Unspecified protein-calorie malnutrition: Secondary | ICD-10-CM | POA: Diagnosis not present

## 2022-01-23 DIAGNOSIS — Z515 Encounter for palliative care: Secondary | ICD-10-CM | POA: Diagnosis not present

## 2022-01-23 DIAGNOSIS — K7460 Unspecified cirrhosis of liver: Secondary | ICD-10-CM

## 2022-01-23 DIAGNOSIS — R7989 Other specified abnormal findings of blood chemistry: Secondary | ICD-10-CM | POA: Diagnosis not present

## 2022-01-23 DIAGNOSIS — E876 Hypokalemia: Secondary | ICD-10-CM | POA: Diagnosis not present

## 2022-01-23 DIAGNOSIS — R531 Weakness: Secondary | ICD-10-CM

## 2022-01-23 DIAGNOSIS — E8770 Fluid overload, unspecified: Secondary | ICD-10-CM | POA: Diagnosis not present

## 2022-01-23 DIAGNOSIS — Z79899 Other long term (current) drug therapy: Secondary | ICD-10-CM | POA: Diagnosis not present

## 2022-01-23 DIAGNOSIS — E1122 Type 2 diabetes mellitus with diabetic chronic kidney disease: Secondary | ICD-10-CM | POA: Diagnosis not present

## 2022-01-23 DIAGNOSIS — N189 Chronic kidney disease, unspecified: Secondary | ICD-10-CM | POA: Diagnosis not present

## 2022-01-23 DIAGNOSIS — R601 Generalized edema: Secondary | ICD-10-CM

## 2022-01-23 DIAGNOSIS — F419 Anxiety disorder, unspecified: Secondary | ICD-10-CM | POA: Diagnosis not present

## 2022-01-23 DIAGNOSIS — F320 Major depressive disorder, single episode, mild: Secondary | ICD-10-CM

## 2022-01-23 DIAGNOSIS — I5033 Acute on chronic diastolic (congestive) heart failure: Secondary | ICD-10-CM | POA: Diagnosis not present

## 2022-01-23 DIAGNOSIS — F32A Depression, unspecified: Secondary | ICD-10-CM | POA: Diagnosis not present

## 2022-01-23 DIAGNOSIS — I272 Pulmonary hypertension, unspecified: Secondary | ICD-10-CM | POA: Diagnosis not present

## 2022-01-23 DIAGNOSIS — Z800 Family history of malignant neoplasm of digestive organs: Secondary | ICD-10-CM | POA: Diagnosis not present

## 2022-01-23 DIAGNOSIS — K7581 Nonalcoholic steatohepatitis (NASH): Secondary | ICD-10-CM

## 2022-01-23 DIAGNOSIS — E441 Mild protein-calorie malnutrition: Secondary | ICD-10-CM

## 2022-01-23 DIAGNOSIS — K7469 Other cirrhosis of liver: Secondary | ICD-10-CM | POA: Diagnosis not present

## 2022-01-23 DIAGNOSIS — Z1152 Encounter for screening for COVID-19: Secondary | ICD-10-CM | POA: Diagnosis not present

## 2022-01-23 DIAGNOSIS — I130 Hypertensive heart and chronic kidney disease with heart failure and stage 1 through stage 4 chronic kidney disease, or unspecified chronic kidney disease: Secondary | ICD-10-CM | POA: Diagnosis not present

## 2022-01-23 DIAGNOSIS — Z7189 Other specified counseling: Secondary | ICD-10-CM | POA: Diagnosis not present

## 2022-01-23 DIAGNOSIS — R69 Illness, unspecified: Secondary | ICD-10-CM | POA: Diagnosis not present

## 2022-01-23 DIAGNOSIS — R161 Splenomegaly, not elsewhere classified: Secondary | ICD-10-CM | POA: Diagnosis not present

## 2022-01-23 DIAGNOSIS — E039 Hypothyroidism, unspecified: Secondary | ICD-10-CM | POA: Diagnosis not present

## 2022-01-23 DIAGNOSIS — Z6841 Body Mass Index (BMI) 40.0 and over, adult: Secondary | ICD-10-CM | POA: Diagnosis not present

## 2022-01-23 DIAGNOSIS — Z8249 Family history of ischemic heart disease and other diseases of the circulatory system: Secondary | ICD-10-CM | POA: Diagnosis not present

## 2022-01-23 DIAGNOSIS — D696 Thrombocytopenia, unspecified: Secondary | ICD-10-CM

## 2022-01-23 DIAGNOSIS — R54 Age-related physical debility: Secondary | ICD-10-CM | POA: Diagnosis not present

## 2022-01-23 DIAGNOSIS — J45909 Unspecified asthma, uncomplicated: Secondary | ICD-10-CM | POA: Diagnosis not present

## 2022-01-23 DIAGNOSIS — R188 Other ascites: Secondary | ICD-10-CM | POA: Diagnosis not present

## 2022-01-23 HISTORY — DX: Generalized edema: R60.1

## 2022-01-23 LAB — COMPREHENSIVE METABOLIC PANEL
ALT: 17 U/L (ref 0–44)
AST: 40 U/L (ref 15–41)
Albumin: 3 g/dL — ABNORMAL LOW (ref 3.5–5.0)
Alkaline Phosphatase: 77 U/L (ref 38–126)
Anion gap: 10 (ref 5–15)
BUN: 10 mg/dL (ref 8–23)
CO2: 21 mmol/L — ABNORMAL LOW (ref 22–32)
Calcium: 8.6 mg/dL — ABNORMAL LOW (ref 8.9–10.3)
Chloride: 107 mmol/L (ref 98–111)
Creatinine, Ser: 1.14 mg/dL (ref 0.61–1.24)
GFR, Estimated: >60 mL/min (ref >60)
Glucose, Bld: 115 mg/dL — ABNORMAL HIGH (ref 70–99)
Potassium: 4.1 mmol/L (ref 3.5–5.1)
Sodium: 138 mmol/L (ref 135–145)
Total Bilirubin: 3.2 mg/dL — ABNORMAL HIGH (ref 0.3–1.2)
Total Protein: 6.8 g/dL (ref 6.5–8.1)

## 2022-01-23 LAB — CBC WITH DIFFERENTIAL/PLATELET
Abs Immature Granulocytes: 0.07 10*3/uL (ref 0.00–0.07)
Basophils Absolute: 0 10*3/uL (ref 0.0–0.1)
Basophils Relative: 1 %
Eosinophils Absolute: 0.2 10*3/uL (ref 0.0–0.5)
Eosinophils Relative: 5 %
HCT: 35.4 % — ABNORMAL LOW (ref 39.0–52.0)
Hemoglobin: 11.6 g/dL — ABNORMAL LOW (ref 13.0–17.0)
Immature Granulocytes: 2 %
Lymphocytes Relative: 21 %
Lymphs Abs: 0.9 10*3/uL (ref 0.7–4.0)
MCH: 29 pg (ref 26.0–34.0)
MCHC: 32.8 g/dL (ref 30.0–36.0)
MCV: 88.5 fL (ref 80.0–100.0)
Monocytes Absolute: 0.5 10*3/uL (ref 0.1–1.0)
Monocytes Relative: 11 %
Neutro Abs: 2.8 10*3/uL (ref 1.7–7.7)
Neutrophils Relative %: 60 %
Platelets: 95 10*3/uL — ABNORMAL LOW (ref 150–400)
RBC: 4 MIL/uL — ABNORMAL LOW (ref 4.22–5.81)
RDW: 17.5 % — ABNORMAL HIGH (ref 11.5–15.5)
WBC: 4.5 10*3/uL (ref 4.0–10.5)
nRBC: 0 % (ref 0.0–0.2)

## 2022-01-23 LAB — URINALYSIS, ROUTINE W REFLEX MICROSCOPIC
Bilirubin Urine: NEGATIVE
Glucose, UA: NEGATIVE mg/dL
Ketones, ur: NEGATIVE mg/dL
Leukocytes,Ua: NEGATIVE
Nitrite: NEGATIVE
Protein, ur: 30 mg/dL — AB
RBC / HPF: >50 RBC/hpf — ABNORMAL HIGH (ref 0–5)
Specific Gravity, Urine: 1.01 (ref 1.005–1.030)
pH: 6 (ref 5.0–8.0)

## 2022-01-23 LAB — PROTIME-INR
INR: 1.4 — ABNORMAL HIGH (ref 0.8–1.2)
Prothrombin Time: 16.8 seconds — ABNORMAL HIGH (ref 11.4–15.2)

## 2022-01-23 LAB — MRSA NEXT GEN BY PCR, NASAL: MRSA by PCR Next Gen: NOT DETECTED

## 2022-01-23 LAB — T4, FREE: Free T4: 1.28 ng/dL — ABNORMAL HIGH (ref 0.61–1.12)

## 2022-01-23 LAB — MAGNESIUM: Magnesium: 2 mg/dL (ref 1.7–2.4)

## 2022-01-23 LAB — AMMONIA: Ammonia: 40 umol/L — ABNORMAL HIGH (ref 9–35)

## 2022-01-23 MED ORDER — PANTOPRAZOLE SODIUM 40 MG PO TBEC
40.0000 mg | DELAYED_RELEASE_TABLET | Freq: Every day | ORAL | Status: DC
  Administered 2022-01-23 – 2022-01-25 (×3): 40 mg via ORAL
  Filled 2022-01-23 (×3): qty 1

## 2022-01-23 MED ORDER — SPIRONOLACTONE 25 MG PO TABS
50.0000 mg | ORAL_TABLET | Freq: Every day | ORAL | Status: DC
  Administered 2022-01-24 – 2022-01-25 (×2): 50 mg via ORAL
  Filled 2022-01-23 (×2): qty 2

## 2022-01-23 MED ORDER — LEVOTHYROXINE SODIUM 50 MCG PO TABS: 175.0000 ug | ORAL_TABLET | Freq: Every day | ORAL | Status: DC

## 2022-01-23 MED ORDER — HEPARIN SODIUM (PORCINE) 5000 UNIT/ML IJ SOLN
5000.0000 [IU] | Freq: Three times a day (TID) | INTRAMUSCULAR | Status: DC
  Administered 2022-01-23 – 2022-01-25 (×8): 5000 [IU] via SUBCUTANEOUS
  Filled 2022-01-23 (×8): qty 1

## 2022-01-23 MED ORDER — SPIRONOLACTONE 25 MG PO TABS: 25.0000 mg | ORAL_TABLET | Freq: Every day | ORAL | Status: DC

## 2022-01-23 MED ORDER — TRAZODONE HCL 50 MG PO TABS
50.0000 mg | ORAL_TABLET | Freq: Every day | ORAL | Status: DC
  Administered 2022-01-23 – 2022-01-24 (×2): 50 mg via ORAL
  Filled 2022-01-23 (×2): qty 1

## 2022-01-23 MED ORDER — HYDROXYZINE HCL 25 MG PO TABS
50.0000 mg | ORAL_TABLET | Freq: Two times a day (BID) | ORAL | Status: DC
  Administered 2022-01-23 – 2022-01-25 (×5): 50 mg via ORAL
  Filled 2022-01-23 (×5): qty 2

## 2022-01-23 MED ORDER — FUROSEMIDE 10 MG/ML IJ SOLN: 40.0000 mg | Freq: Two times a day (BID) | INTRAMUSCULAR | Status: DC

## 2022-01-23 MED ORDER — ONDANSETRON HCL 4 MG PO TABS: 4.0000 mg | ORAL_TABLET | Freq: Four times a day (QID) | ORAL | Status: DC | PRN

## 2022-01-23 MED ORDER — SPIRONOLACTONE 25 MG PO TABS
25.0000 mg | ORAL_TABLET | Freq: Every day | ORAL | Status: DC
  Administered 2022-01-23 (×2): 25 mg via ORAL
  Filled 2022-01-23 (×2): qty 1

## 2022-01-23 MED ORDER — ONDANSETRON HCL 4 MG/2ML IJ SOLN: 4.0000 mg | Freq: Four times a day (QID) | INTRAMUSCULAR | Status: DC | PRN

## 2022-01-23 MED ORDER — IBUPROFEN 400 MG PO TABS
400.0000 mg | ORAL_TABLET | Freq: Four times a day (QID) | ORAL | Status: DC | PRN
  Administered 2022-01-25: 400 mg via ORAL
  Filled 2022-01-23: qty 1

## 2022-01-23 MED ORDER — SERTRALINE HCL 50 MG PO TABS
50.0000 mg | ORAL_TABLET | Freq: Every day | ORAL | Status: DC
  Administered 2022-01-23 – 2022-01-24 (×2): 50 mg via ORAL
  Filled 2022-01-23 (×2): qty 1

## 2022-01-23 MED ORDER — FUROSEMIDE 10 MG/ML IJ SOLN
60.0000 mg | Freq: Two times a day (BID) | INTRAMUSCULAR | Status: DC
  Administered 2022-01-23 – 2022-01-25 (×4): 60 mg via INTRAVENOUS
  Filled 2022-01-23 (×4): qty 6

## 2022-01-23 MED ORDER — MORPHINE SULFATE (PF) 2 MG/ML IV SOLN
2.0000 mg | INTRAVENOUS | Status: DC | PRN
  Administered 2022-01-23: 2 mg via INTRAVENOUS
  Filled 2022-01-23: qty 1

## 2022-01-23 MED ORDER — OXYCODONE HCL 5 MG PO TABS
5.0000 mg | ORAL_TABLET | ORAL | Status: DC | PRN
  Administered 2022-01-24: 5 mg via ORAL
  Filled 2022-01-23: qty 1

## 2022-01-23 MED ORDER — LEVOTHYROXINE SODIUM 100 MCG PO TABS
200.0000 ug | ORAL_TABLET | Freq: Every day | ORAL | Status: DC
  Administered 2022-01-23: 200 ug via ORAL
  Filled 2022-01-23: qty 2

## 2022-01-23 MED ORDER — RIFAXIMIN 550 MG PO TABS
550.0000 mg | ORAL_TABLET | Freq: Two times a day (BID) | ORAL | Status: DC
  Administered 2022-01-23 – 2022-01-25 (×5): 550 mg via ORAL
  Filled 2022-01-23 (×5): qty 1

## 2022-01-23 MED ORDER — LEVOTHYROXINE SODIUM 75 MCG PO TABS
175.0000 ug | ORAL_TABLET | Freq: Every day | ORAL | Status: DC
  Administered 2022-01-24 – 2022-01-25 (×2): 175 ug via ORAL
  Filled 2022-01-23 (×2): qty 1

## 2022-01-23 MED ORDER — CHLORHEXIDINE GLUCONATE CLOTH 2 % EX PADS
6.0000 | MEDICATED_PAD | Freq: Every day | CUTANEOUS | Status: DC
  Administered 2022-01-23: 6 via TOPICAL

## 2022-01-23 MED ORDER — MORPHINE SULFATE (PF) 2 MG/ML IV SOLN: 1.0000 mg | INTRAVENOUS | Status: DC | PRN

## 2022-01-23 MED ORDER — SPIRONOLACTONE 25 MG PO TABS
25.0000 mg | ORAL_TABLET | Freq: Once | ORAL | Status: AC
  Administered 2022-01-23: 25 mg via ORAL
  Filled 2022-01-23: qty 1

## 2022-01-23 MED ORDER — FUROSEMIDE 10 MG/ML IJ SOLN
80.0000 mg | Freq: Two times a day (BID) | INTRAMUSCULAR | Status: DC
  Administered 2022-01-23: 80 mg via INTRAVENOUS
  Filled 2022-01-23: qty 8

## 2022-01-23 NOTE — Progress Notes (Signed)
ASSUMPTION OF CARE NOTE   01/23/2022 10:13 AM  Daniel Crawford was seen and examined.  The H&P by the admitting provider, orders, imaging was reviewed.  Please see new orders.  Will continue to follow.    I'm not convinced he needs paracentesis. Will order an [place] ascites to check that there is enough fluid there to benefit from a paracentesis.   I have asked for a GI consult to help with managing a decompensated cirrhosis with  massive volume overload.  Continue IV lasix.  Monitor renal function closely.   I reduced levothyroxine down from 200 mcg to 175 mcg as his free T4 is elevated.   Vitals:   01/23/22 0900 01/23/22 1000  BP: (!) 118/96 (!) 152/67  Pulse: 97 85  Resp: 11 16  Temp:    SpO2: 96% 93%    Results for orders placed or performed during the hospital encounter of 01/22/22  Resp Panel by RT-PCR (Flu A&B, Covid) Anterior Nasal Swab   Specimen: Anterior Nasal Swab  Result Value Ref Range   SARS Coronavirus 2 by RT PCR NEGATIVE NEGATIVE   Influenza A by PCR NEGATIVE NEGATIVE   Influenza B by PCR NEGATIVE NEGATIVE  Basic metabolic panel  Result Value Ref Range   Sodium 136 135 - 145 mmol/L   Potassium 3.6 3.5 - 5.1 mmol/L   Chloride 103 98 - 111 mmol/L   CO2 25 22 - 32 mmol/L   Glucose, Bld 172 (H) 70 - 99 mg/dL   BUN 10 8 - 23 mg/dL   Creatinine, Ser 1.15 0.61 - 1.24 mg/dL   Calcium 8.6 (L) 8.9 - 10.3 mg/dL   GFR, Estimated >60 >60 mL/min   Anion gap 8 5 - 15  CBC  Result Value Ref Range   WBC 4.3 4.0 - 10.5 K/uL   RBC 4.00 (L) 4.22 - 5.81 MIL/uL   Hemoglobin 11.6 (L) 13.0 - 17.0 g/dL   HCT 35.3 (L) 39.0 - 52.0 %   MCV 88.3 80.0 - 100.0 fL   MCH 29.0 26.0 - 34.0 pg   MCHC 32.9 30.0 - 36.0 g/dL   RDW 17.3 (H) 11.5 - 15.5 %   Platelets 95 (L) 150 - 400 K/uL   nRBC 0.0 0.0 - 0.2 %  Hepatic function panel  Result Value Ref Range   Total Protein 6.8 6.5 - 8.1 g/dL   Albumin 3.0 (L) 3.5 - 5.0 g/dL   AST 40 15 - 41 U/L   ALT 16 0 - 44 U/L   Alkaline Phosphatase 80 38 -  126 U/L   Total Bilirubin 3.5 (H) 0.3 - 1.2 mg/dL   Bilirubin, Direct 1.1 (H) 0.0 - 0.2 mg/dL   Indirect Bilirubin 2.4 (H) 0.3 - 0.9 mg/dL  CK  Result Value Ref Range   Total CK 117 49 - 397 U/L  TSH  Result Value Ref Range   TSH 5.431 (H) 0.350 - 4.500 uIU/mL  Urinalysis, Routine w reflex microscopic Urine, Clean Catch  Result Value Ref Range   Color, Urine AMBER (A) YELLOW   APPearance CLEAR CLEAR   Specific Gravity, Urine 1.010 1.005 - 1.030   pH 6.0 5.0 - 8.0   Glucose, UA NEGATIVE NEGATIVE mg/dL   Hgb urine dipstick SMALL (A) NEGATIVE   Bilirubin Urine NEGATIVE NEGATIVE   Ketones, ur NEGATIVE NEGATIVE mg/dL   Protein, ur 30 (A) NEGATIVE mg/dL   Nitrite NEGATIVE NEGATIVE   Leukocytes,Ua NEGATIVE NEGATIVE   RBC / HPF >50 (H)  0 - 5 RBC/hpf   WBC, UA 0-5 0 - 5 WBC/hpf   Bacteria, UA RARE (A) NONE SEEN   Mucus PRESENT    Ca Oxalate Crys, UA PRESENT   Magnesium  Result Value Ref Range   Magnesium 2.0 1.7 - 2.4 mg/dL  Brain natriuretic peptide  Result Value Ref Range   B Natriuretic Peptide 58.0 0.0 - 100.0 pg/mL  D-dimer, quantitative  Result Value Ref Range   D-Dimer, Quant 19.85 (H) 0.00 - 0.50 ug/mL-FEU  T4, free  Result Value Ref Range   Free T4 1.28 (H) 0.61 - 1.12 ng/dL  Ammonia  Result Value Ref Range   Ammonia 40 (H) 9 - 35 umol/L  Comprehensive metabolic panel  Result Value Ref Range   Sodium 138 135 - 145 mmol/L   Potassium 4.1 3.5 - 5.1 mmol/L   Chloride 107 98 - 111 mmol/L   CO2 21 (L) 22 - 32 mmol/L   Glucose, Bld 115 (H) 70 - 99 mg/dL   BUN 10 8 - 23 mg/dL   Creatinine, Ser 1.14 0.61 - 1.24 mg/dL   Calcium 8.6 (L) 8.9 - 10.3 mg/dL   Total Protein 6.8 6.5 - 8.1 g/dL   Albumin 3.0 (L) 3.5 - 5.0 g/dL   AST 40 15 - 41 U/L   ALT 17 0 - 44 U/L   Alkaline Phosphatase 77 38 - 126 U/L   Total Bilirubin 3.2 (H) 0.3 - 1.2 mg/dL   GFR, Estimated >60 >60 mL/min   Anion gap 10 5 - 15  Magnesium  Result Value Ref Range   Magnesium 2.0 1.7 - 2.4 mg/dL  CBC  with Differential/Platelet  Result Value Ref Range   WBC 4.5 4.0 - 10.5 K/uL   RBC 4.00 (L) 4.22 - 5.81 MIL/uL   Hemoglobin 11.6 (L) 13.0 - 17.0 g/dL   HCT 35.4 (L) 39.0 - 52.0 %   MCV 88.5 80.0 - 100.0 fL   MCH 29.0 26.0 - 34.0 pg   MCHC 32.8 30.0 - 36.0 g/dL   RDW 17.5 (H) 11.5 - 15.5 %   Platelets 95 (L) 150 - 400 K/uL   nRBC 0.0 0.0 - 0.2 %   Neutrophils Relative % 60 %   Neutro Abs 2.8 1.7 - 7.7 K/uL   Lymphocytes Relative 21 %   Lymphs Abs 0.9 0.7 - 4.0 K/uL   Monocytes Relative 11 %   Monocytes Absolute 0.5 0.1 - 1.0 K/uL   Eosinophils Relative 5 %   Eosinophils Absolute 0.2 0.0 - 0.5 K/uL   Basophils Relative 1 %   Basophils Absolute 0.0 0.0 - 0.1 K/uL   WBC Morphology MORPHOLOGY UNREMARKABLE    RBC Morphology MORPHOLOGY UNREMARKABLE    Immature Granulocytes 2 %   Abs Immature Granulocytes 0.07 0.00 - 0.07 K/uL  Protime-INR  Result Value Ref Range   Prothrombin Time 16.8 (H) 11.4 - 15.2 seconds   INR 1.4 (H) 0.8 - 1.2  Troponin I (High Sensitivity)  Result Value Ref Range   Troponin I (High Sensitivity) 6 <18 ng/L  Troponin I (High Sensitivity)  Result Value Ref Range   Troponin I (High Sensitivity) 7 <18 ng/L   Prolonged service time: 35 mins  Murvin Natal, MD [facility]   01/22/2022  5:39 PM How to contact the [facility] Attending or Consulting provider 7A - 7P or covering provider during after hours 7P -7A, for this patient?  Check the care team in [facility] and look for a) attending/consulting [facility]  provider listed and b) the [facility] team listed Log into [email or web address] and use [facility] universal password to access. If you do not have the password, please contact the hospital operator. Locate the [facility] provider you are looking for under [facility] and page to a number that you can be directly reached. If you still have difficulty reaching the provider, please page the [facility] (Director on Call) for the Hospitalists listed on amion for assistance.

## 2022-01-23 NOTE — Assessment & Plan Note (Signed)
-   Patient reported chest pressure and shortness of breath at presentation.  He was tachycardic as well.  D-dimer was done that was 19.85 - CTA chest has ruled out PE - Respiratory panel negative --This could be related to cirrhosis, but patient does not appear to be in acute on chronic liver failure: T. bili is similar to previous, albumin is similar to previous, AST is right about at baseline 40, ALT is just a bit lower than baseline at 16, INR will be done with morning labs - No calf tenderness - Continue to monitor

## 2022-01-23 NOTE — Assessment & Plan Note (Signed)
-   PT eval and treat - Likely related to fluid overload - In the ED they can ambulate him more than 3 steps, but patient feels that he still safe to live at home independently - Appreciate PT recs - Continue to monitor

## 2022-01-23 NOTE — H&P (Signed)
History and Physical    Patient: Daniel Crawford TTS:[id number] DOB: 1955-12-22 DOA: 01/22/2022 DOS: the patient was seen and examined on 01/23/2022 PCP: [name], MD  Patient coming from: Home  Chief Complaint:  Chief Complaint  Patient presents with   Shortness of Breath   HPI: Daniel Crawford is a 66 y.o. male with medical history significant of anxiety, CHF, CKD, depression, diabetes mellitus without complication, hypertension, hypothyroidism, [place], splenomegaly, thrombocytopenia, and more presents to ED with a chief complaint of abdominal and chest pain.  Patient reports he feels like something is been sitting on his abdomen and chest.  He reports his whole chest up to his neck and his whole abdomen down to his pelvis feels miserable.  This started 11 days ago after he had his flu shot.  Patient reports he has had no appetite and has not been able to eat anything.  He had no nausea or vomiting.  He denies fevers as well.  He reports he had 1 day of diarrhea.  He had 3 episodes of diarrhea in that day.  It was a couple of days ago.  As he could tell there was no hematochezia or melena.  Patient reports that he has felt generalized weakness, malaise, fatigue.  He reports that he has been wheezing at home, and had palpitations.  He can feel better if we can get the fluid off.  He has never had a paracentesis, but is hoping that he would be eligible now.  Patient reports otherwise he has been in his normal state of health.  Patient does not smoke, does not drink, does not use illicit drugs.  He is vaccinated for COVID.  Patient is full code. Review of Systems: As mentioned in the history of present illness. All other systems reviewed and are negative. Past Medical History:  Diagnosis Date   Anemia    Anxiety    Asthma    CHF (congestive heart failure) ([place])    CKD (chronic kidney disease)    Depression    Diabetes mellitus without complication ([place])    Dyspnea    Hypertension     Hypothyroidism    Liver cirrhosis secondary to NASH ([place])    NASH (nonalcoholic steatohepatitis)    Pre-diabetes    Spleen enlarged    Thrombocytopenia ([place])    Past Surgical History:  Procedure Laterality Date   Bilateral hernia surgery     2006, 2007   CHOLECYSTECTOMY     2016    COLONOSCOPY WITH PROPOFOL N/A 06/28/2021   Procedure: COLONOSCOPY WITH PROPOFOL;  Surgeon: Rogene Houston, MD;  [facility];  Service: Endoscopy;  Laterality: N/A;  1020   ESOPHAGEAL DILATION N/A 11/06/2019   Procedure: ESOPHAGEAL DILATION;  Surgeon: Harvel Quale, MD;  [facility];  Service: Gastroenterology;  Laterality: N/A;   ESOPHAGOGASTRODUODENOSCOPY (EGD) WITH PROPOFOL N/A 11/06/2019   Procedure: ESOPHAGOGASTRODUODENOSCOPY (EGD) WITH PROPOFOL;  Surgeon: Harvel Quale, MD;  [facility];  Service: Gastroenterology;  Laterality: N/A;  Evans removed     in January of 2018.    POLYPECTOMY  06/28/2021   Procedure: POLYPECTOMY INTESTINAL;  Surgeon: Rogene Houston, MD;  [facility];  Service: Endoscopy;;   RIGHT HEART CATH N/A 01/08/2022   Procedure: RIGHT HEART CATH;  Surgeon: Jettie Booze, MD;  Location: [facility];  Service: Cardiovascular;  Laterality: N/A;   Social  History:  reports that he quit smoking about 6 years ago. His smoking use included cigarettes. He has a 20.00 pack-year smoking history. He has never used smokeless tobacco. He reports that he does not drink alcohol and does not use drugs.  No Known Allergies  Family History  Problem Relation Age of Onset   Liver cancer Mother    Heart disease Mother    Aneurysm Sister     Prior to Admission medications   Medication Sig Start Date End Date Taking? Authorizing Provider  acetaminophen (TYLENOL) 500 MG tablet Take 1,000 mg by mouth every 6 (six) hours as needed for moderate pain.   Yes [provider]  albuterol (VENTOLIN HFA) 108 (90 Base) MCG/ACT inhaler TAKE 2 PUFFS BY MOUTH EVERY 6 HOURS AS NEEDED FOR WHEEZE OR SHORTNESS OF BREATH Patient taking differently: Inhale 2 puffs into the lungs every 6 (six) hours as needed for wheezing or shortness of breath. 11/02/21  Yes Rigoberto Noel, MD  furosemide (LASIX) 40 MG tablet Take 1 tablet (40 mg total) by mouth daily. Patient taking differently: Take 80 mg by mouth See admin instructions. Take 80 mg in the morning and 80 mg in the evening  on Tuesday and Saturday twice a day 04/23/21  Yes Vann, Jessica U, DO  gabapentin (NEURONTIN) 100 MG capsule Take 100 mg by mouth daily as needed (pain). 02/20/21  Yes [provider]  hydrOXYzine (VISTARIL) 50 MG capsule Take 50 mg by mouth 2 (two) times daily.   Yes [provider]  levothyroxine (SYNTHROID) 175 MCG tablet Take 175 mcg by mouth daily before breakfast.   Yes [provider]  ondansetron (ZOFRAN) 8 MG tablet Take 8 mg by mouth every 8 (eight) hours as needed for nausea or vomiting.   Yes [provider]  pantoprazole (PROTONIX) 40 MG tablet Take 40 mg by mouth daily.   Yes [provider]  potassium chloride SA (KLOR-CON M) 20 MEQ tablet Take 1 tablet (20 mEq total) by mouth 2 (two) times daily. Patient taking differently: Take 40 mEq by mouth See admin instructions. 2 tablets daily except on Tues and Sat 4 tablets daily 09/20/21  Yes Idol, Almyra Free, PA-C  rifaximin (XIFAXAN) 550 MG TABS tablet Take 550 mg by mouth 2 (two) times daily.   Yes [provider]  sertraline (ZOLOFT) 50 MG tablet Take 50 mg by mouth at bedtime.   Yes [provider]  spironolactone (ALDACTONE) 25 MG tablet Take 1 tablet (25 mg total) by mouth daily in the afternoon. 12/27/21  Yes Almyra Deforest, PA  traZODone (DESYREL) 50 MG tablet Take 50 mg by mouth at bedtime.   Yes [provider]  carvedilol (COREG) 12.5 MG tablet Take 12.5 mg by mouth 2  (two) times daily with a meal. Patient not taking: Reported on 01/22/2022    [provider]  furosemide (LASIX) 20 MG tablet Take 80-100 mg by mouth See admin instructions. Take with 40 mg to equal a total of 80-100 mg twice a day every Sunday, Monday, Wednesday,Thursday and Friday Patient not taking: Reported on 01/22/2022    [provider]  polyethylene glycol-electrolytes (TRILYTE) 420 g solution Take 4,000 mLs by mouth as directed. Patient not taking: Reported on 01/05/2022 11/20/21   Harvel Quale, MD    Physical Exam: Vitals:   01/22/22 2000 01/22/22 2215 01/22/22 2230 01/23/22 0450  BP: 126/69 135/74 (!) 151/83 138/71  Pulse: 88 89 88 88  Resp:  20  20 20  Temp:      TempSrc:      SpO2: 98% 98% 98% 98%  Weight:      Height:       1.  General: Patient lying supine in bed,  no acute distress   2. Psychiatric: Alert and oriented x 3, mood and behavior normal for situation, pleasant and cooperative with exam   3. Neurologic: Speech and language are normal, face is symmetric, moves all 4 extremities voluntarily, at baseline without acute deficits on limited exam   4. HEENMT:  Head is atraumatic, normocephalic, pupils reactive to light, neck is supple, trachea is midline, mucous membranes are moist   5. Respiratory : Lungs are diminished in the lower lung fields bilaterally without wheezing, rhonchi, rales, no cyanosis, no increase in work of breathing or accessory muscle use   6. Cardiovascular : Heart rate normal, rhythm is regular, no murmurs, rubs or gallops, significant edema present up to umbilicus, peripheral pulses palpated   7. Gastrointestinal:  Abdomen is taut, fluid wave, diffusely tender to palpation, bowel sounds active, no masses or organomegaly palpated   8. Skin:  Chronic venous stasis changes of the lower extremities bilaterally   9.Musculoskeletal:  No acute deformities or trauma, no asymmetry in tone, peripheral edema  present, peripheral pulses palpated, no tenderness to palpation in the extremities  Data Reviewed: In the ED Temp 98.6, heart rate 93-106, respiratory rate 22, blood pressure 137/78, satting 96-100% on room air No leukocytosis with a white blood cell count of 4.3, thrombocytopenia with platelets 95 Chemistry reveals a hyperglycemia at 172 albumin 3.0, T. bili 3.5 D-dimer 19.85 CTA chest negative for PE Chest x-ray shows no acute cardiopulmonary disease COVID and flu negative CT chest abdomen pelvis shows cirrhosis with splenomegaly and small ascites-this is possibly not enough to drain Nonobstructing cholelithiasis 80 mg IV Lasix given in the ED  Assessment and Plan: * Anasarca - Pitting edema up to umbilicus - In the setting of liver cirrhosis - Continue spironolactone - Patient takes 80 mg of p.o. Lasix twice daily at baseline - Continue 80 mg IV Lasix twice daily - Monitor intake and output - Consider evaluation for paracentesis  Essential hypertension - Continue home Coreg  Generalized weakness - PT eval and treat - Likely related to fluid overload - In the ED they can ambulate him more than 3 steps, but patient feels that he still safe to live at home independently - Appreciate PT recs - Continue to monitor  Protein calorie malnutrition ([place]) - Albumin 3.0 - This is read about his baseline -.  Nutrient dense food options - Liver cirrhosis likely contributing to low albumin  D-dimer, elevated - Patient reported chest pressure and shortness of breath at presentation.  He was tachycardic as well.  D-dimer was done that was 19.85 - CTA chest has ruled out PE - Respiratory panel negative --This could be related to cirrhosis, but patient does not appear to be in acute on chronic liver failure: T. bili is similar to previous, albumin is similar to previous, AST is right about at baseline 40, ALT is just a bit lower than baseline at 16, INR will be done with morning labs -  No calf tenderness - Continue to monitor  Elevated TSH - TSH elevated in patient with hypothyroidism - Increase Synthroid by 25 mcg - Will need follow-up TSH in 6 weeks  Liver cirrhosis secondary to NASH ([place]) - Continue Xifaxan, spironolactone - Continue to monitor  Advance Care Planning:   Code Status: Full Code   Consults: None  Family Communication: No family at bedside  Severity of Illness: The appropriate patient status for this patient is OBSERVATION. Observation status is judged to be reasonable and necessary in order to provide the required intensity of service to ensure the patient's safety. The patient's presenting symptoms, physical exam findings, and initial radiographic and laboratory data in the context of their medical condition is felt to place them at decreased risk for further clinical deterioration. Furthermore, it is anticipated that the patient will be medically stable for discharge from the hospital within 2 midnights of admission.   Author: Rolla Plate, DO 01/23/2022 5:39 AM  For on call review [email or web address].

## 2022-01-23 NOTE — Assessment & Plan Note (Signed)
-   Continue Xifaxan, spironolactone - Continue to monitor

## 2022-01-23 NOTE — Consult Note (Signed)
Consultation Note Date: 01/23/2022   Patient Name: Daniel Crawford  DOB: [birth year]  MRN: [id number]  Age / Sex: 66 y.o., male  PCP: [name], MD Referring Physician: Murlean Iba, MD  Reason for Consultation: Establishing goals of care  HPI/Patient Profile: 66 y.o. male  with past medical history of [name], CHF, CKD, DM, HTN, splenomegaly, hypothyroid, thrombocytopenia, anxiety/depression, asthma, obesity admitted on 01/22/2022 with anasarca, fluid overload.   Clinical Assessment and Goals of Care: I have reviewed medical records including EPIC notes, labs and imaging, received report from RN, assessed the patient.  Daniel Crawford is resting quietly in bed in the intensive care.  He greets me, making and mostly keeping eye contact.  He appears acutely/chronically ill, obese.  He is alert and oriented x3, able to make his basic needs known.  There is no family present at bedside at this time.    We meet at bedside to discuss diagnosis prognosis, GOC, EOL wishes, disposition and options.  I introduced Palliative Medicine as specialized medical care for people living with serious illness. It focuses on providing relief from the symptoms and stress of a serious illness. The goal is to improve quality of life for both the patient and the family.  We discussed a brief life review of the patient.  Daniel Crawford is unmarried and has no children.  He lives alone, except for his small dog.  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  He has been independent with ADLs/IADLs.    We then focused on their current illness.  We talk about fluid overload, paracentesis, nutrition.  Overall, Daniel Crawford seems knowledgeable about his health concerns.  He also shares that he has thought about what the future [place] hold for him.  The natural disease trajectory and expectations at EOL were discussed.  Advanced directives, concepts specific to  code status, artifical feeding and hydration, and rehospitalization were considered and discussed.  We talked about the concept of "treat the treatable, but allowing natural passing".  Daniel Crawford states that he would want attempted resuscitation but not long-term life support, no tracheostomy.  He tells me that his mother had a tracheostomy.  Palliative Care services outpatient were explained and offered.  Due to changes in services offered, at this point it is unlikely he will qualify for outpatient palliative services.  Discussed the importance of continued conversation with family and the medical providers regarding overall plan of care and treatment options, ensuring decisions are within the context of the patient's values and GOCs.  Questions and concerns were addressed.  The patient was encouraged to call with questions or concerns.  PMT will continue to support holistically.  Conference with attending, bedside nursing staff, transition of care team related to patient condition, needs, goals of care, disposition.   HCPOA  NEXT OF KIN we talk about the importance of having healthcare power of attorney.  Daniel Crawford states that his parents are deceased.  He is unmarried and has no children.  He tells me that he has  a brother living in [place].  I encouraged him to name a healthcare power of attorney.  We have service here in the hospital that can help him complete this paperwork.  SUMMARY OF RECOMMENDATIONS   At this point continue full scope/full code Time for outcomes Preference would be to return to his own apartment Considering outpatient palliative care   Code Status/Advance Care Planning: Full code -we talk about the concept of "treat the treatable, but allowing natural passing".  Daniel Crawford states that he would want attempted resuscitation but no long-term life support/trach.  Symptom Management:  Per hospitalist, no additional needs at this time.  Palliative Prophylaxis:  Frequent Pain  Assessment and Oral Care  Additional Recommendations (Limitations, Scope, Preferences): Full Scope Treatment  Psycho-social/Spiritual:  Desire for further Chaplaincy support:no Additional Recommendations: Caregiving  Support/Resources  Prognosis:  Unable to determine, based on outcomes.  Discharge Planning: To be determined, based on outcomes.  Need for short-term rehab would not be surprising.      Primary Diagnoses: Present on Admission:  Anasarca  Liver cirrhosis secondary to NASH ([place])  Thrombocytopenia ([place])  Depression   I have reviewed the medical record, interviewed the patient and family, and examined the patient. The following aspects are pertinent.  Past Medical History:  Diagnosis Date   Anemia    Anxiety    Asthma    CHF (congestive heart failure) ([facility])    CKD (chronic kidney disease)    Depression    Diabetes mellitus without complication ([facility])    Dyspnea    Hypertension    Hypothyroidism    Liver cirrhosis secondary to NASH ([facility])    NASH (nonalcoholic steatohepatitis)    Pre-diabetes    Spleen enlarged    Thrombocytopenia ([facility])    Social History   Socioeconomic History   Marital status: Single    Spouse name: Not on file   Number of children: Not on file   Years of education: Not on file   Highest education level: Not on file  Occupational History   Not on file  Tobacco Use   Smoking status: Former    Packs/day: 1.00    Years: 20.00    Total pack years: 20.00    Types: Cigarettes    Quit date: 2017    Years since quitting: 6.8   Smokeless tobacco: Never  Vaping Use   Vaping Use: Never used  Substance and Sexual Activity   Alcohol use: No   Drug use: No   Sexual activity: Not on file  Other Topics Concern   Not on file  Social History Narrative   Not on file   Social Determinants of Health   Financial Resource Strain: Not on file  Food Insecurity: No Food Insecurity (01/23/2022)   Hunger Vital Sign    Worried About Running  Out of Food in the Last Year: Never true    Ran Out of Food in the Last Year: Never true  Transportation Needs: No Transportation Needs (01/23/2022)   PRAPARE - Hydrologist (Medical): No    Lack of Transportation (Non-Medical): No  Physical Activity: Not on file  Stress: Not on file  Social Connections: Not on file   Family History  Problem Relation Age of Onset   Liver cancer Mother    Heart disease Mother    Aneurysm Sister    Scheduled Meds:  Chlorhexidine Gluconate Cloth  6 each Topical Daily   furosemide  60 mg Intravenous BID  heparin  5,000 Units Subcutaneous Q8H   hydrOXYzine  50 mg Oral BID   [START ON 01/24/2022] levothyroxine  175 mcg Oral Q0600   pantoprazole  40 mg Oral Daily   rifaximin  550 mg Oral BID   sertraline  50 mg Oral QHS   spironolactone  25 mg Oral Q1500   traZODone  50 mg Oral QHS   Continuous Infusions: PRN Meds:.ibuprofen, morphine injection, ondansetron **OR** ondansetron (ZOFRAN) IV, oxyCODONE Medications Prior to Admission:  Prior to Admission medications   Medication Sig Start Date End Date Taking? Authorizing Provider  acetaminophen (TYLENOL) 500 MG tablet Take 1,000 mg by mouth every 6 (six) hours as needed for moderate pain.   Yes [provider]  albuterol (VENTOLIN HFA) 108 (90 Base) MCG/ACT inhaler TAKE 2 PUFFS BY MOUTH EVERY 6 HOURS AS NEEDED FOR WHEEZE OR SHORTNESS OF BREATH Patient taking differently: Inhale 2 puffs into the lungs every 6 (six) hours as needed for wheezing or shortness of breath. 11/02/21  Yes Rigoberto Noel, MD  furosemide (LASIX) 40 MG tablet Take 1 tablet (40 mg total) by mouth daily. Patient taking differently: Take 80 mg by mouth See admin instructions. Take 80 mg in the morning and 80 mg in the evening  on Tuesday and Saturday twice a day 04/23/21  Yes Vann, Jessica U, DO  gabapentin (NEURONTIN) 100 MG capsule Take 100 mg by mouth daily as needed (pain). 02/20/21  Yes [provider]  hydrOXYzine (VISTARIL) 50 MG capsule Take 50 mg by mouth 2 (two) times daily.   Yes [provider]  levothyroxine (SYNTHROID) 175 MCG tablet Take 175 mcg by mouth daily before breakfast.   Yes [provider]  ondansetron (ZOFRAN) 8 MG tablet Take 8 mg by mouth every 8 (eight) hours as needed for nausea or vomiting.   Yes [provider]  pantoprazole (PROTONIX) 40 MG tablet Take 40 mg by mouth daily.   Yes [provider]  potassium chloride SA (KLOR-CON M) 20 MEQ tablet Take 1 tablet (20 mEq total) by mouth 2 (two) times daily. Patient taking differently: Take 40 mEq by mouth See admin instructions. 2 tablets daily except on Tues and Sat 4 tablets daily 09/20/21  Yes Idol, Almyra Free, PA-C  rifaximin (XIFAXAN) 550 MG TABS tablet Take 550 mg by mouth 2 (two) times daily.   Yes [provider]  sertraline (ZOLOFT) 50 MG tablet Take 50 mg by mouth at bedtime.   Yes [provider]  spironolactone (ALDACTONE) 25 MG tablet Take 1 tablet (25 mg total) by mouth daily in the afternoon. 12/27/21  Yes Almyra Deforest, PA  traZODone (DESYREL) 50 MG tablet Take 50 mg by mouth at bedtime.   Yes [provider]  carvedilol (COREG) 12.5 MG tablet Take 12.5 mg by mouth 2 (two) times daily with a meal. Patient not taking: Reported on 01/22/2022    [provider]  furosemide (LASIX) 20 MG tablet Take 80-100 mg by mouth See admin instructions. Take with 40 mg to equal a total of 80-100 mg twice a day every Sunday, Monday, Wednesday,Thursday and Friday Patient not taking: Reported on 01/22/2022    [provider]  polyethylene glycol-electrolytes (TRILYTE) 420 g solution Take 4,000 mLs by mouth as directed. Patient not taking: Reported on 01/05/2022 11/20/21   Harvel Quale, MD   No Known Allergies Review of Systems  Unable to perform ROS: Other    Physical Exam Vitals and nursing note reviewed.  Constitutional:       General: He is not in acute distress.    Appearance: He is obese. He is ill-appearing.  HENT:     Head: Normocephalic and atraumatic.  Cardiovascular:     Rate and Rhythm: Normal rate.  Pulmonary:     Effort: Pulmonary effort is normal.  Abdominal:     Palpations: Abdomen is soft.  Musculoskeletal:     Right lower leg: Edema present.     Left lower leg: Edema present.  Skin:    General: Skin is warm and dry.  Neurological:     Mental Status: He is alert and oriented to person, place, and time.  Psychiatric:        Mood and Affect: Mood normal.        Behavior: Behavior normal.     Vital Signs: BP (!) 152/67   Pulse 85   Temp 98.6 F (37 C) (Oral)   Resp 16   Ht '6\' 2"'$  (1.88 m)   Wt (!) 170.1 kg   SpO2 93%   BMI 48.15 kg/m  Pain Scale: 0-10   Pain Score: 5    SpO2: SpO2: 93 % O2 Device:SpO2: 93 % O2 Flow Rate: .   IO: Intake/output summary:  Intake/Output Summary (Last 24 hours) at 01/23/2022 1105 Last data filed at 01/23/2022 1023 Gross per 24 hour  Intake --  Output 4950 ml  Net -4950 ml    LBM: Last BM Date : 01/22/22 Baseline Weight: Weight: (!) 170.1 kg Most recent weight: Weight: (!) 170.1 kg     Palliative Assessment/Data:   Flowsheet Rows    Flowsheet Row Most Recent Value  Intake Tab   Referral Department Hospitalist  Unit at Time of Referral Cardiac/Telemetry Unit  Palliative Care Primary Diagnosis Cardiac  Date Notified 01/23/22  Palliative Care Type New Palliative care  Reason for referral Clarify Goals of Care  Date of Admission 01/22/22  Date first seen by Palliative Care 01/23/22  # of days Palliative referral response time 0 Day(s)  # of days IP prior to Palliative referral 1  Clinical Assessment   Palliative Performance Scale Score 50%  Pain Max last 24 hours Not able to report  Pain Min Last 24 hours Not able to report  Dyspnea Max Last 24 Hours Not able to report  Dyspnea Min Last 24 hours Not able to report   Psychosocial & Spiritual Assessment   Palliative Care Outcomes        Time In: 1200 Time Out: 1315 Time Total: 75 minutes  Greater than 50%  of this time was spent counseling and coordinating care related to the above assessment and plan.  Signed by: Drue Novel, NP   Please contact Palliative Medicine Team phone at [phone] for questions and concerns.  For individual provider: See Shea Evans

## 2022-01-23 NOTE — Assessment & Plan Note (Signed)
-   Pitting edema up to umbilicus - In the setting of liver cirrhosis - Continue spironolactone - Patient takes 80 mg of p.o. Lasix twice daily at baseline - Continue 80 mg IV Lasix twice daily - Monitor intake and output - Consider evaluation for paracentesis

## 2022-01-23 NOTE — Consult Note (Signed)
Gastroenterology Consult   Referring Provider: No ref. provider found Primary Care Physician:  [name], MD Primary Gastroenterologist:  Harvel Quale, MD  Patient ID: Daniel Crawford; [id number]; 02-11-1956   Admit date: 01/22/2022  LOS: 0 days   Date of Consultation: 01/23/2022  Reason for Consultation:  decompensated cirrhosis  History of Present Illness   Daniel Crawford is a 66 y.o. year old male with history of anxiety, CHF, CKD, depression, diabetes, HTN, hypothyroidism, NASH cirrhosis diagnosed in 2017 via liver biopsy c/b splenomegaly, recent ascites, HE, and thrombocytopenia. He presented to the ED with abdominal and chest pain that coincidentally started after receiving his flu shot about 11 days prior as well as wheezing and heart palpitations. GI consulted for decompensated cirrhosis and assess need for paracentesis.   ED Course: HR 93-106, BP stable, O2 stable on room air Hgb 11.6 (baseline), WBC 4.3., platelets 95, glucose 172, albumin 3, T. Bili 3.5 CT Chest/abdomen/pelvis: small ascites, cirrhosis, cholecystectomy, splenomegaly, thickened appearance of the colon likely related to hepatic colopathy CT Angio chest: no PE, aortic root and ascending aortic dilatation of 4.3 cm, small hiatal hernia with fluid present in esophagus, cirrhosis and splenomegaly, moderate ascites. COVID and flu negative Given IV lasix 80 mg   Consult: Last seen in the office 07/27/21 by Dr. Jenetta Downer: reported recurrent episodes of bloating and abdominal distention to his RUQ. Denied N/V, fevers, chills, melena, BRBPR, diarrhea, jaundice, pruritis, weight loss. Also reported some possible visual hallucinations. His spironolactone was stopped due to mastodynia and hypokalemia and he was started on Eplerenone 50 mg daily and continue furosemide 60 mg daily. Instructed to stop oral potassium and have labs checked in 1 week. MELD labs, AFP,  and abdominal ultrasound ordered. Scheduled for repeat  colonoscopy in order to attempt to remove cecal polyp. Head CT ordered due to concerns for hallucinations.   08/15/21: AFP 9.5, Na 139, albumin 3.4, Cr 1.27, AST 50, ALT 20, Bili 2.5, INR 1.2, potasium 4  Has been evaluated by [facility] for liver transplant but not considered a candidate given lower meld score of 6 and BMI greater than 40.   Ultrasound 07/31/21: evidence of cirrhosis and no hepatic lesion  CT A/P 09/20/21: cirrhosis, no lesions, cholecystectomy, normal pancreas, splenomegaly, diffuse mesenteric edema without ascites  Ultrasound 12/06/21: No evidence of ascites, questionable anasarca given significant soft tissue thickening of abdominal wall.   Last EGD 11/06/19: - Normal esophagus s/p dilation - Portal hypertensive gastropathy - Normal duodenum  Last colonoscopy 06/28/21: - one medium sized tubular adenoma in the cecum, unable to be removed due to poor approach - Five 4-7 mm tubular adenomas in the transverse colon and hepatic flexure - External and internal hemorrhoids  [place] ascites today with small volume splenic and perihepatic ascites, insufficient for paracentesis.  Most recent visit with Cardiology 12/27/21. Had an increase in weight since prior visit. States he took 20 mg lasix twice daily for 3 days and then went back to lasix 80 mg in the morning and 20 mg in the evening. Taking spironolactone 25 mg daily and not epleronone. Suspected to have multifactorial dyspnea given cirrhosis, diastolic heart failure, obstructive pulmonary disease (diagnosed from PFT results), and obesity. Suggested he [name] be candidate for CardioMEMS device in the future. Cardiology suggested to switch to Lasix 80 mg BID but reported patient had hesitancy due to this so he was advised lasix 80 mg BID on Tuesday's and Saturday's and his prior regimen the rest of the week. Advised 40 meQ  potassium 5 days per week.   Right heart cath 01/08/22 with volume overload and mild pulmonary hypertension.   Patient  reports gradual worsening of peripheral edema and abdominal swelling. Has been having a feeling of chest heaviness and intermittent abdominal pain primarily lower abdominal pain for months. He reports that he has been having increased dyspnea with walking to take out his trash or get groceries out of his car and into his house. He reports that he had a right heart cath done after seeing cardiology and that his lasix was increased. He was told to take 80 mg lasix twice daily and on Tuesdays and Saturdays he should take 120. He does report an adequate amount of diuresis after medications but reports excessive weakness after taking diuretics and causes him to not have much energy.   Denies any melena, BRBPR, pruritis, jaundice. States he is avoiding red meats and has been eating fresh fruits/vegetables. States he rarely buys canned goods and does not add salt to his diet. Taking Xifaxin and denies any confusion recently.   States he lives at home alone and has a dog he cares for. Able to perform all ADLs on his own. Drove himself to the hospital yesterday.   Past Medical History:  Diagnosis Date   Anemia    Anxiety    Asthma    CHF (congestive heart failure) ([place])    CKD (chronic kidney disease)    Depression    Diabetes mellitus without complication ([place])    Dyspnea    Hypertension    Hypothyroidism    Liver cirrhosis secondary to NASH ([place])    NASH (nonalcoholic steatohepatitis)    Pre-diabetes    Spleen enlarged    Thrombocytopenia ([place])     Past Surgical History:  Procedure Laterality Date   Bilateral hernia surgery     2006, 2007   CHOLECYSTECTOMY     2016    COLONOSCOPY WITH PROPOFOL N/A 06/28/2021   Procedure: COLONOSCOPY WITH PROPOFOL;  Surgeon: Rogene Houston, MD;  [facility]: [facility];  Service: Endoscopy;  Laterality: N/A;  1020   ESOPHAGEAL DILATION N/A 11/06/2019   Procedure: ESOPHAGEAL DILATION;  Surgeon: Harvel Quale, MD;  Location: [facility];   Service: Gastroenterology;  Laterality: N/A;   ESOPHAGOGASTRODUODENOSCOPY (EGD) WITH PROPOFOL N/A 11/06/2019   Procedure: ESOPHAGOGASTRODUODENOSCOPY (EGD) WITH PROPOFOL;  Surgeon: Harvel Quale, MD;  [facility]: [facility];  Service: Gastroenterology;  Laterality: N/A;  [place] removed     in January of 2018.    POLYPECTOMY  06/28/2021   Procedure: POLYPECTOMY INTESTINAL;  Surgeon: Rogene Houston, MD;  Location: [facility];  Service: Endoscopy;;   RIGHT HEART CATH N/A 01/08/2022   Procedure: RIGHT HEART CATH;  Surgeon: Jettie Booze, MD;  Location: [facility];  Service: Cardiovascular;  Laterality: N/A;    Prior to Admission medications   Medication Sig Start Date End Date Taking? Authorizing Provider  acetaminophen (TYLENOL) 500 MG tablet Take 1,000 mg by mouth every 6 (six) hours as needed for moderate pain.   Yes [provider]  albuterol (VENTOLIN HFA) 108 (90 Base) MCG/ACT inhaler TAKE 2 PUFFS BY MOUTH EVERY 6 HOURS AS NEEDED FOR WHEEZE OR SHORTNESS OF BREATH Patient taking differently: Inhale 2 puffs into the lungs every 6 (six) hours as needed for wheezing or shortness of breath. 11/02/21  Yes Rigoberto Noel, MD  furosemide (LASIX) 40  MG tablet Take 1 tablet (40 mg total) by mouth daily. Patient taking differently: Take 80 mg by mouth See admin instructions. Take 80 mg in the morning and 80 mg in the evening  on Tuesday and Saturday twice a day 04/23/21  Yes Vann, Jessica U, DO  gabapentin (NEURONTIN) 100 MG capsule Take 100 mg by mouth daily as needed (pain). 02/20/21  Yes [provider]  hydrOXYzine (VISTARIL) 50 MG capsule Take 50 mg by mouth 2 (two) times daily.   Yes [provider]  levothyroxine (SYNTHROID) 175 MCG tablet Take 175 mcg by mouth daily before breakfast.   Yes [provider]  ondansetron (ZOFRAN) 8 MG tablet Take 8 mg by mouth every 8 (eight) hours  as needed for nausea or vomiting.   Yes [provider]  pantoprazole (PROTONIX) 40 MG tablet Take 40 mg by mouth daily.   Yes [provider]  potassium chloride SA (KLOR-CON M) 20 MEQ tablet Take 1 tablet (20 mEq total) by mouth 2 (two) times daily. Patient taking differently: Take 40 mEq by mouth See admin instructions. 2 tablets daily except on Tues and Sat 4 tablets daily 09/20/21  Yes Idol, Almyra Free, PA-C  rifaximin (XIFAXAN) 550 MG TABS tablet Take 550 mg by mouth 2 (two) times daily.   Yes [provider]  sertraline (ZOLOFT) 50 MG tablet Take 50 mg by mouth at bedtime.   Yes [provider]  spironolactone (ALDACTONE) 25 MG tablet Take 1 tablet (25 mg total) by mouth daily in the afternoon. 12/27/21  Yes Almyra Deforest, PA  traZODone (DESYREL) 50 MG tablet Take 50 mg by mouth at bedtime.   Yes [provider]  carvedilol (COREG) 12.5 MG tablet Take 12.5 mg by mouth 2 (two) times daily with a meal. Patient not taking: Reported on 01/22/2022    [provider]  furosemide (LASIX) 20 MG tablet Take 80-100 mg by mouth See admin instructions. Take with 40 mg to equal a total of 80-100 mg twice a day every Sunday, Monday, Wednesday,Thursday and Friday Patient not taking: Reported on 01/22/2022    [provider]  polyethylene glycol-electrolytes (TRILYTE) 420 g solution Take 4,000 mLs by mouth as directed. Patient not taking: Reported on 01/05/2022 11/20/21   Harvel Quale, MD    Current Facility-Administered Medications  Medication Dose Route Frequency Provider Last Rate Last Admin   Chlorhexidine Gluconate Cloth 2 % PADS 6 each  6 each Topical Daily Zierle-Ghosh, Asia B, DO       furosemide (LASIX) injection 40 mg  40 mg Intravenous BID Johnson, Clanford L, MD       heparin injection 5,000 Units  5,000 Units Subcutaneous Q8H Zierle-Ghosh, Asia B, DO   5,000 Units at 01/23/22 5625   hydrOXYzine (ATARAX) tablet 50 mg  50 mg Oral  BID Zierle-Ghosh, Asia B, DO       ibuprofen (ADVIL) tablet 400 mg  400 mg Oral Q6H PRN Zierle-Ghosh, Asia B, DO       levothyroxine (SYNTHROID) tablet 200 mcg  200 mcg Oral Q0600 Zierle-Ghosh, Asia B, DO   200 mcg at 01/23/22 6389   morphine (PF) 2 MG/ML injection 1 mg  1 mg Intravenous Q4H PRN Johnson, Clanford L, MD       ondansetron (ZOFRAN) tablet 4 mg  4 mg Oral Q6H PRN Zierle-Ghosh, Asia B, DO       Or   ondansetron (ZOFRAN) injection 4 mg  4 mg Intravenous Q6H PRN Zierle-Ghosh,  Asia B, DO       oxyCODONE (Oxy IR/ROXICODONE) immediate release tablet 5 mg  5 mg Oral Q4H PRN Zierle-Ghosh, Asia B, DO       pantoprazole (PROTONIX) EC tablet 40 mg  40 mg Oral Daily Zierle-Ghosh, Asia B, DO       rifaximin (XIFAXAN) tablet 550 mg  550 mg Oral BID Zierle-Ghosh, Asia B, DO       sertraline (ZOLOFT) tablet 50 mg  50 mg Oral QHS Zierle-Ghosh, Asia B, DO       spironolactone (ALDACTONE) tablet 25 mg  25 mg Oral Q1500 Zierle-Ghosh, Asia B, DO   25 mg at 01/23/22 0120   traZODone (DESYREL) tablet 50 mg  50 mg Oral QHS Zierle-Ghosh, Asia B, DO        Allergies as of 01/22/2022   (No Known Allergies)    Family History  Problem Relation Age of Onset   Liver cancer Mother    Heart disease Mother    Aneurysm Sister     Social History   Socioeconomic History   Marital status: Single    Spouse name: Not on file   Number of children: Not on file   Years of education: Not on file   Highest education level: Not on file  Occupational History   Not on file  Tobacco Use   Smoking status: Former    Packs/day: 1.00    Years: 20.00    Total pack years: 20.00    Types: Cigarettes    Quit date: 2017    Years since quitting: 6.8   Smokeless tobacco: Never  Vaping Use   Vaping Use: Never used  Substance and Sexual Activity   Alcohol use: No   Drug use: No   Sexual activity: Not on file  Other Topics Concern   Not on file  Social History Narrative   Not on file   Social Determinants of  Health   Financial Resource Strain: Not on file  Food Insecurity: No Food Insecurity (01/23/2022)   Hunger Vital Sign    Worried About Running Out of Food in the Last Year: Never true    Ran Out of Food in the Last Year: Never true  Transportation Needs: No Transportation Needs (01/23/2022)   PRAPARE - Hydrologist (Medical): No    Lack of Transportation (Non-Medical): No  Physical Activity: Not on file  Stress: Not on file  Social Connections: Not on file  Intimate Partner Violence: Not At Risk (01/23/2022)   Humiliation, Afraid, Rape, and Kick questionnaire    Fear of Current or Ex-Partner: No    Emotionally Abused: No    Physically Abused: No    Sexually Abused: No     Review of Systems   Gen: Denies any fever, chills, loss of appetite, change in weight or weight loss CV: Denies chest pain, heart palpitations, syncope, edema  Resp: Denies shortness of breath with rest, cough, wheezing, coughing up blood, and pleurisy. GI: Denies vomiting blood, jaundice, and fecal incontinence.   Denies dysphagia or odynophagia. GU : Denies urinary burning, blood in urine, urinary frequency, and urinary incontinence. MS: Denies joint pain, limitation of movement, swelling, cramps, and atrophy.  Derm: Denies rash, itching, dry skin, hives. Psych: Denies depression, anxiety, memory loss, hallucinations, and confusion. Heme: Denies bruising or bleeding Neuro:  Denies any headaches, dizziness, paresthesias, shaking  Physical Exam   Vital Signs in last 24 hours: Temp:  [98.6 F (37 C)]  98.6 F (37 C) (10/31 0700) Pulse Rate:  [84-106] 88 (10/31 0700) Resp:  [18-22] 18 (10/31 0700) BP: (118-151)/(65-83) 131/75 (10/31 0700) SpO2:  [93 %-100 %] 98 % (10/31 0700) Weight:  [170.1 kg] 170.1 kg (10/30 1702)    General:   Alert,  Well-developed, well-nourished, pleasant and cooperative in NAD Head:  Normocephalic and atraumatic. Eyes:  Sclera clear, no icterus.    Conjunctiva pink. Ears:  Normal auditory acuity. Mouth:  No deformity or lesions, dentition normal. Neck:  Supple; no masses Lungs:  Clear throughout to auscultation.   No wheezes, crackles, or rhonchi. No acute distress. Heart:  Regular rate and rhythm; no murmurs, clicks, rubs,  or gallops. Abdomen:  Soft, nontender and nondistended. No masses, hepatosplenomegaly or hernias noted. Normal bowel sounds, without guarding, and without rebound.   Rectal: deferred   Msk:  Symmetrical without gross deformities. Normal posture. Extremities:  +2 pitting edema to BLE extending to his thigh, no significant hip or abdominal anasarca.  Neurologic:  Alert and  oriented x4. Skin:  Intact without significant lesions or rashes. Psych:  Alert and cooperative. Normal mood and affect.  Intake/Output from previous day: 10/30 0701 - 10/31 0700 In: -  Out: 2750 [Urine:2750] Intake/Output this shift: Total I/O In: -  Out: 1250 [Urine:1250]   Labs/Studies   Recent Labs Recent Labs    01/22/22 1755 01/23/22 0052  WBC 4.3 4.5  HGB 11.6* 11.6*  HCT 35.3* 35.4*  PLT 95* 95*   BMET Recent Labs    01/22/22 1755 01/23/22 0052  NA 136 138  K 3.6 4.1  CL 103 107  CO2 25 21*  GLUCOSE 172* 115*  BUN 10 10  CREATININE 1.15 1.14  CALCIUM 8.6* 8.6*   LFT Recent Labs    01/22/22 1755 01/23/22 0052  PROT 6.8 6.8  ALBUMIN 3.0* 3.0*  AST 40 40  ALT 16 17  ALKPHOS 80 77  BILITOT 3.5* 3.2*  BILIDIR 1.1*  --   IBILI 2.4*  --    PT/INR Recent Labs    01/23/22 0537  LABPROT 16.8*  INR 1.4*   Hepatitis Panel No results for input(s): "HEPBSAG", "HCVAB", "HEPAIGM", "HEPBIGM" in the last 72 hours. C-Diff No results for input(s): "CDIFFTOX" in the last 72 hours.  Radiology/Studies CT Angio Chest PE W and/or Wo Contrast  Result Date: 01/22/2022 CLINICAL DATA:  Increasing shortness of breath on exertion for 1 week, began after receiving COVID booster shot. Underwent CT without contrast  earlier today for unintentional weight loss. EXAM: CT ANGIOGRAPHY CHEST WITH CONTRAST TECHNIQUE: Multidetector CT imaging of the chest was performed using the standard protocol during bolus administration of intravenous contrast. Multiplanar CT image reconstructions and MIPs were obtained to evaluate the vascular anatomy. RADIATION DOSE REDUCTION: This exam was performed according to the departmental dose-optimization program which includes automated exposure control, adjustment of the mA and/or kV according to patient size and/or use of iterative reconstruction technique. CONTRAST:  110m OMNIPAQUE IOHEXOL 350 MG/ML SOLN COMPARISON:  Chest CT without contrast earlier today, CTA chest 03/23/2021 FINDINGS: Cardiovascular: The cardiac size is normal. There are no visible coronary artery calcifications no pericardial effusion. Pulmonary arteries are normal caliber and free of thrombus through the segmental divisions, but the subsegmental arterial bed is not evaluated due to both bolus timing and respiratory motion artifacts. There is mild calcific atherosclerosis in the distal aortic arch, mild descending segment tortuosity without further atherosclerosis. There is no stenosis or dissection in the aorta and great vessels.  The aortic root and ascending segment are slightly dilated however with the aortic root measuring 4 cm at the sinuses of Valsalva and the ascending segment 4.3 cm in diameter. This was seen previously. Mediastinum/Nodes: Small hiatal hernia. There is retained versus refluxed fluid in the esophagus to above the level of the aortic arch. There is no esophageal masslike thickening. No thyroid mass is seen or intrathoracic or axillary adenopathy. The trachea is clear. Lungs/Pleura: Lungs are clear. No pleural effusion or pneumothorax. Upper Abdomen: Again noted findings of hepatic cirrhosis, splenomegaly, moderate ascites and a prominent main portal vein measuring 2 cm. No interval change is seen from  earlier today. Musculoskeletal: No acute or significant osseous findings. Mild thoracic spondylosis. There is bilateral gynecomastia of the diffuse fibroglandular form. Review of the MIP images confirms the above findings. IMPRESSION: 1. No evidence of arterial dilatation or embolus through the segmental divisions. The subsegmental arterial beds are not evaluated due to bolus timing and respiratory motion. 2. No acute chest CT or CTA findings. 3. Aortic root and ascending aortic dilatation, 4.3 cm in the ascending segment. No dissection or stenosis. Annual CTA or MRA follow-up recommended. 4. Small hiatal hernia with retained versus refluxed fluid in the esophagus to above the level of the aortic arch. Aspiration precautions suggested. 5. Hepatic cirrhosis with splenomegaly, moderate ascites and a prominent main portal vein. No interval change from earlier today. 6. Gynecomastia. Electronically Signed   By: Telford Nab M.D.   On: 01/22/2022 21:07   CT CHEST ABDOMEN PELVIS WO CONTRAST  Result Date: 01/22/2022 CLINICAL DATA:  An intentional weight loss. EXAM: CT CHEST, ABDOMEN AND PELVIS WITHOUT CONTRAST TECHNIQUE: Multidetector CT imaging of the chest, abdomen and pelvis was performed following the standard protocol without IV contrast. RADIATION DOSE REDUCTION: This exam was performed according to the departmental dose-optimization program which includes automated exposure control, adjustment of the mA and/or kV according to patient size and/or use of iterative reconstruction technique. COMPARISON:  CT abdomen pelvis dated 09/20/2021. FINDINGS: Evaluation of this exam is limited in the absence of intravenous contrast. CT CHEST FINDINGS Cardiovascular: There is no cardiomegaly or pericardial effusion. The thoracic aorta and the central pulmonary arteries are grossly unremarkable on this noncontrast CT. Mediastinum/Nodes: No hilar or mediastinal adenopathy. The esophagus is grossly unremarkable. No mediastinal  fluid collection. Lungs/Pleura: The lungs are clear. There is no pleural effusion or pneumothorax. The central airways are patent. Musculoskeletal: No acute osseous pathology. Bilateral gynecomastia. CT ABDOMEN PELVIS FINDINGS No intra-abdominal free air.  Small ascites. Hepatobiliary: Cirrhosis.  Cholecystectomy. Pancreas: The pancreas is grossly unremarkable. Spleen: Splenomegaly measuring 23 cm in craniocaudal length. Adrenals/Urinary Tract: The adrenal glands unremarkable. There is a 3 mm nonobstructing right renal inferior pole calculus. No hydronephrosis. There is no hydronephrosis or nephrolithiasis on the left. The urinary bladder is collapsed. Stomach/Bowel: Diffuse thickened appearance of the colon likely related to hepatic colopathy. Colitis is less likely. Clinical correlation is recommended. There is no bowel obstruction. The appendix is not identified with certainty. Vascular/Lymphatic: The abdominal aorta and IVC are grossly unremarkable on this noncontrast CT. No portal venous gas. There is no adenopathy. Reproductive: The prostate and seminal vesicles are grossly unremarkable. Other: Small fat containing left inguinal hernia. Left inguinal hernia repair mesh. There is a fat containing umbilical hernia. The neck of the hernia defect measures 4.5 cm in greatest axial diameter. There is diffuse subcutaneous edema. Musculoskeletal: Osteopenia with degenerative changes of the spine. No acute osseous pathology. IMPRESSION: 1. No acute  intrathoracic pathology. 2. Cirrhosis with splenomegaly and small ascites. 3. Diffuse thickened appearance of the colon likely related to hepatic colopathy. Colitis is less likely. No bowel obstruction. 4. A 3 mm nonobstructing right renal inferior pole calculus. No hydronephrosis. Electronically Signed   By: Anner Crete M.D.   On: 01/22/2022 19:35   DG Chest 2 View  Result Date: 01/22/2022 CLINICAL DATA:  Shortness of breath. Body aches and chills. Dehydration.  EXAM: CHEST - 2 VIEW COMPARISON:  Chest two views 11/30/2021 FINDINGS: Cardiac silhouette and mediastinal contours are within normal limits. The lungs are clear. No pleural effusion or pneumothorax. Minimal multilevel degenerative disc changes of the thoracic spine. IMPRESSION: No active cardiopulmonary disease. Electronically Signed   By: Yvonne Kendall M.D.   On: 01/22/2022 17:32     Assessment   Daniel Crawford is a 66 y.o. year old male anxiety, CHF, CKD, depression, diabetes, HTN, hypothyroidism, NASH cirrhosis diagnosed in 2017 via liver biopsy c/b splenomegaly, recent ascites, HE, and thrombocytopenia. He presented to the ED with abdominal and chest pain that coincidentally started after receiving his flu shot about 11 days prior as well as wheezing and heart palpitations. GI consulted for decompensated cirrhosis and assess need for paracentesis.   Decompensated NASH cirrhosis: Presented with abdominal pain, chest pain, and difficulty breathing. CTA negative for PE. CXR negative. CT C/A/P without pulmonary disease, liver consistent with cirrhosis without significant ascites, evidence of hepatic colopathy.  Ultrasound this morning with small amount of perisplenic and prehepatic ascites that is insufficient for paracentesis. He has +2/+3 pitting edema to BLE extending into his thigh with minimal hip anasarca. Admits to not completely adhering to diuretic regimen as given per cardiology. Has dyspnea at rest and with exertion but saturations maintained on room air. Denies melena, BRBPR, nausea, vomiting, confusion, constipation, pruritis. Has been having about 3-5 bowel movements daily and is taking his Xifaxin as prescribed. EGD in 2021 without varices. Labs stable overall. Ammonia 40 (around baseline), Bilirubin remains elevated at 3.2 (3.5 in June). Normal AST, ALT, alk phos. Platelets 95 and Hgb 11.6. INR 1.4 (baseline 1.1-1.3). MELD Na: 16 (15 in [place]). Child Pugh: B/C. Unfortunately he has been evaluated by  [facility] and he has been deemed to not meet criteria for liver transplant due to his BMI. Suspect largely that anasarca related to diastolic heart failure and sub-optimal diuretic management. Would favor increasing spironolactone given high doses of furosemide. Appears patient never started eplerenone (started due to mastodynia with spironolactone) and has been only on spironolactone 25 mg daily. Aplerenone not on formulary inpatient. Will discuss amiloride vs increasing spironolactone with Dr. Abbey Chatters.   Dyspnea, chronic diastolic heart failure: Prior PFT in March 2020 with severe obstructive airway disease with minimal reversibility.  Has been following with cardiology them regarding his dyspnea and edema.  He has had multiple adjustments to his diuretic therapy.  Most recent cardiology recommended 80 mg of Lasix twice daily however given patient's hesitancy he was advised to take twice daily dosing on Tuesdays and Saturdays and 100 mg total daily for the rest of the week.  Tries to adhere to regimen but states sometimes he does not take his full second dose of 90 on Tuesday or Saturday. Right heart cath 01/09/2019 with findings consistent with mild pulmonary hypertension and evidence of volume overload.  Recommended continued management for diastolic heart failure.  Filed Weights   01/22/22 1702  Weight: (!) 170.1 kg   12/27/21 (!) 385 lb 6.4 oz (174.8 kg)  12/26/21 (!) 382 lb (173.3 kg)  12/18/21 (!) 378 lb 6.4 oz (171.6 kg)   Colon polyps: Colonoscopy in April with tubular adenomas. Polyp in the cecum unable to be removed. In need of repeat colonoscopy which has been postponed due to fluid overload.   Plan / Recommendations   Agree with aggressive diuresis with IV lasix Increase spironolactone slowly to 50 mg daily with goal to optimize to 100 mg daily.  Continue PPI daily Follow heart healthy, 2 g sodium diet Limit tylenol to 2000 mg daily.  Avoid raw or undercooked meat, seafood, shellfish.   Continue Xifaxin 550 mg BID Strict daily weights Need for outpatient colonoscopy in near future.      01/23/2022, 8:58 AM  Venetia Night, MSN, FNP-BC, AGACNP-BC [facility]

## 2022-01-23 NOTE — Assessment & Plan Note (Signed)
-   Continue home Coreg

## 2022-01-23 NOTE — Assessment & Plan Note (Signed)
-   With anxiety - Continue Zoloft and Vistaril - Continue trazodone for sleep

## 2022-01-23 NOTE — Assessment & Plan Note (Signed)
-   Albumin 3.0 - This is read about his baseline -.  Nutrient dense food options - Liver cirrhosis likely contributing to low albumin

## 2022-01-23 NOTE — Evaluation (Signed)
Physical Therapy Evaluation Patient Details Name: Daniel Crawford MRN: [id number] DOB: Mar 17, 1956 Today's Date: 01/23/2022  History of Present Illness  Daniel Crawford is a 66 y.o. male with medical history significant of anxiety, CHF, CKD, depression, diabetes mellitus without complication, hypertension, hypothyroidism, [place], splenomegaly, thrombocytopenia, and more presents to ED with a chief complaint of abdominal and chest pain.  Patient reports he feels like something is been sitting on his abdomen and chest.  He reports his whole chest up to his neck and his whole abdomen down to his pelvis feels miserable.  This started 11 days ago after he had his flu shot.  Patient reports he has had no appetite and has not been able to eat anything.  He had no nausea or vomiting.  He denies fevers as well.  He reports he had 1 day of diarrhea.  He had 3 episodes of diarrhea in that day.  It was a couple of days ago.  As he could tell there was no hematochezia or melena.  Patient reports that he has felt generalized weakness, malaise, fatigue.  He reports that he has been wheezing at home, and had palpitations.  He can feel better if we can get the fluid off.  He has never had a paracentesis, but is hoping that he would be eligible now.  Patient reports otherwise he has been in his normal state of health.   Clinical Impression  Patient functioning near baseline for functional mobility and gait demonstrating good return for bed mobility, transfers and ambulating in room/hallways without loss of balance.  Plan:  Patient discharged from physical therapy to care of nursing for ambulation daily as tolerated for length of stay.         Recommendations for follow up therapy are one component of a multi-disciplinary discharge planning process, led by the attending physician.  Recommendations Edwin be updated based on patient status, additional functional criteria and insurance authorization.  Follow Up Recommendations No PT follow  up      Assistance Recommended at Discharge PRN  Patient can return home with the following  Help with stairs or ramp for entrance    Equipment Recommendations None recommended by PT  Recommendations for Other Services       Functional Status Assessment Patient has not had a recent decline in their functional status     Precautions / Restrictions Precautions Precautions: None      Mobility  Bed Mobility Overal bed mobility: Modified Independent                  Transfers Overall transfer level: Modified independent                      Ambulation/Gait Ambulation/Gait assistance: Modified independent (Device/Increase time) Gait Distance (Feet): 100 Feet Assistive device: None Gait Pattern/deviations: WFL(Within Functional Limits) Gait velocity: slightly decreased     General Gait Details: grossly WFL demonstrating good return for ambulation in room and hallways without loss of balance  Stairs            Wheelchair Mobility    Modified Rankin (Stroke Patients Only)       Balance Overall balance assessment: No apparent balance deficits (not formally assessed)                                           Pertinent Vitals/Pain Pain Assessment  Pain Assessment: Faces Faces Pain Scale: Hurts little more Pain Location: generalized pain all over body Pain Descriptors / Indicators: Aching Pain Intervention(s): Limited activity within patient's tolerance, Monitored during session    [place] expects to be discharged to:: Private residence Living Arrangements: Alone Available Help at Discharge: Family;Available PRN/intermittently Type of Home: Apartment Home Access: Level entry       Home Layout: One level Home Equipment: Conservation officer, nature (2 wheels);Grab bars - tub/shower      Prior Function Prior Level of Function : Independent/Modified Independent             Mobility Comments: Community  ambulator without AD, drives ADLs Comments: Independent     Hand Dominance        Extremity/Trunk Assessment   Upper Extremity Assessment Upper Extremity Assessment: Overall WFL for tasks assessed    Lower Extremity Assessment Lower Extremity Assessment: Overall WFL for tasks assessed    Cervical / Trunk Assessment Cervical / Trunk Assessment: Normal  Communication   Communication: No difficulties  Cognition Arousal/Alertness: Awake/alert Behavior During Therapy: WFL for tasks assessed/performed Overall Cognitive Status: Within Functional Limits for tasks assessed                                          General Comments      Exercises     Assessment/Plan    PT Assessment Patient does not need any further PT services  PT Problem List         PT Treatment Interventions      PT Goals (Current goals can be found in the Care Plan section)  Acute Rehab PT Goals Patient Stated Goal: return home PT Goal Formulation: With patient Time For Goal Achievement: 01/23/22 Potential to Achieve Goals: Good    Frequency       Co-evaluation               AM-PAC PT "6 Clicks" Mobility  Outcome Measure Help needed turning from your back to your side while in a flat bed without using bedrails?: None Help needed moving from lying on your back to sitting on the side of a flat bed without using bedrails?: None Help needed moving to and from a bed to a chair (including a wheelchair)?: None Help needed standing up from a chair using your arms (e.g., wheelchair or bedside chair)?: None Help needed to walk in hospital room?: None Help needed climbing 3-5 steps with a railing? : A Little 6 Click Score: 23    End of Session   Activity Tolerance: Patient tolerated treatment well Patient left: in chair;with call bell/phone within reach Nurse Communication: Mobility status PT Visit Diagnosis: Unsteadiness on feet (R26.81);Other abnormalities of gait and  mobility (R26.89);Muscle weakness (generalized) (M62.81)    Time: 9937-1696 PT Time Calculation (min) (ACUTE ONLY): 15 min   Charges:   PT Evaluation $PT Eval Low Complexity: 1 Low PT Treatments $Therapeutic Activity: 8-22 mins        12:24 PM, 01/23/22 Lonell Grandchild, MPT Physical Therapist with [facility] 336 [phone] office [phone] mobile phone

## 2022-01-23 NOTE — Assessment & Plan Note (Signed)
-   TSH elevated in patient with hypothyroidism - Increase Synthroid by 25 mcg - Will need follow-up TSH in 6 weeks

## 2022-01-23 NOTE — Assessment & Plan Note (Addendum)
-   Platelets down to 95 - Secondary to liver cirrhosis - Trend with a.m. labs

## 2022-01-24 ENCOUNTER — Encounter: Payer: Medicare HMO | Admitting: Nutrition

## 2022-01-24 DIAGNOSIS — Z515 Encounter for palliative care: Secondary | ICD-10-CM | POA: Diagnosis not present

## 2022-01-24 DIAGNOSIS — R601 Generalized edema: Secondary | ICD-10-CM | POA: Diagnosis not present

## 2022-01-24 DIAGNOSIS — K7581 Nonalcoholic steatohepatitis (NASH): Secondary | ICD-10-CM | POA: Diagnosis not present

## 2022-01-24 DIAGNOSIS — Z7189 Other specified counseling: Secondary | ICD-10-CM | POA: Diagnosis not present

## 2022-01-24 LAB — COMPREHENSIVE METABOLIC PANEL
ALT: 15 U/L (ref 0–44)
AST: 38 U/L (ref 15–41)
Albumin: 2.7 g/dL — ABNORMAL LOW (ref 3.5–5.0)
Alkaline Phosphatase: 68 U/L (ref 38–126)
Anion gap: 8 (ref 5–15)
BUN: 8 mg/dL (ref 8–23)
CO2: 28 mmol/L (ref 22–32)
Calcium: 7.8 mg/dL — ABNORMAL LOW (ref 8.9–10.3)
Chloride: 100 mmol/L (ref 98–111)
Creatinine, Ser: 1.22 mg/dL (ref 0.61–1.24)
GFR, Estimated: >60 mL/min (ref >60)
Glucose, Bld: 114 mg/dL — ABNORMAL HIGH (ref 70–99)
Potassium: 2.7 mmol/L — CL (ref 3.5–5.1)
Sodium: 136 mmol/L (ref 135–145)
Total Bilirubin: 3.3 mg/dL — ABNORMAL HIGH (ref 0.3–1.2)
Total Protein: 6.2 g/dL — ABNORMAL LOW (ref 6.5–8.1)

## 2022-01-24 LAB — CBC
HCT: 29.8 % — ABNORMAL LOW (ref 39.0–52.0)
Hemoglobin: 10.1 g/dL — ABNORMAL LOW (ref 13.0–17.0)
MCH: 29.2 pg (ref 26.0–34.0)
MCHC: 33.9 g/dL (ref 30.0–36.0)
MCV: 86.1 fL (ref 80.0–100.0)
Platelets: 83 10*3/uL — ABNORMAL LOW (ref 150–400)
RBC: 3.46 MIL/uL — ABNORMAL LOW (ref 4.22–5.81)
RDW: 17.2 % — ABNORMAL HIGH (ref 11.5–15.5)
WBC: 3.3 10*3/uL — ABNORMAL LOW (ref 4.0–10.5)
nRBC: 0 % (ref 0.0–0.2)

## 2022-01-24 LAB — PROTIME-INR
INR: 1.4 — ABNORMAL HIGH (ref 0.8–1.2)
Prothrombin Time: 17.2 seconds — ABNORMAL HIGH (ref 11.4–15.2)

## 2022-01-24 LAB — MAGNESIUM: Magnesium: 1.7 mg/dL (ref 1.7–2.4)

## 2022-01-24 MED ORDER — CARVEDILOL 12.5 MG PO TABS
12.5000 mg | ORAL_TABLET | Freq: Two times a day (BID) | ORAL | Status: DC
  Administered 2022-01-24 – 2022-01-25 (×3): 12.5 mg via ORAL
  Filled 2022-01-24 (×3): qty 1

## 2022-01-24 MED ORDER — POTASSIUM CHLORIDE CRYS ER 20 MEQ PO TBCR
40.0000 meq | EXTENDED_RELEASE_TABLET | Freq: Once | ORAL | Status: AC
  Administered 2022-01-24: 40 meq via ORAL
  Filled 2022-01-24: qty 2

## 2022-01-24 NOTE — Progress Notes (Addendum)
Subjective: Patient states he is feeling much better this morning. Still would like to go home, is concerned about his dog. Feels that SOB is much improved, as is edema. No abdominal pain, nausea or vomiting. Denies episodes of confusion or pruritus. States that he has issues with taking lacutlose as he has fecal urgency and difficulty making it to the restroom. Denies any recent issues with confusion on outpatient basis maintained on xifaxan. Last BM was this morning.   Objective: Vital signs in last 24 hours: Temp:  [97.9 F (36.6 C)-98.5 F (36.9 C)] 98.1 F (36.7 C) (11/01 0833) Pulse Rate:  [83-96] 96 (10/31 1800) Resp:  [13-21] 21 (10/31 1800) BP: (142-169)/(65-90) 169/69 (10/31 1800) SpO2:  [93 %-99 %] 99 % (10/31 1800) Last BM Date : 01/23/22 General:   Alert and oriented, pleasant Head:  Normocephalic and atraumatic. Eyes:  No icterus, sclera clear. Conjuctiva pink.  Mouth:  Without lesions, mucosa pink and moist.   Heart:  S1, S2 present, no murmurs noted.  Lungs: Clear to auscultation bilaterally, without wheezing, rales, or rhonchi.  Abdomen:  Bowel sounds present, soft, non-tender. Abdomen full but not taut.  No HSM or hernias noted. No rebound or guarding. No masses appreciated  Msk:  Symmetrical without gross deformities. Normal posture. Pulses:  Normal pulses noted. Extremities:  2+ pitting edema up to knees, non pitting edema in thighs  Neurologic:  Alert and  oriented x4;  grossly normal neurologically. No asterixis Skin:  Warm and dry, intact without significant lesions.  Psych:  Alert and cooperative. Normal mood and affect.  Intake/Output from previous day: 10/31 0701 - 11/01 0700 In: -  Out: 4700 [Urine:4700] Intake/Output this shift: No intake/output data recorded.  Lab Results: Recent Labs    01/22/22 1755 01/23/22 0052 01/24/22 0306  WBC 4.3 4.5 3.3*  HGB 11.6* 11.6* 10.1*  HCT 35.3* 35.4* 29.8*  PLT 95* 95* 83*   BMET Recent Labs     01/22/22 1755 01/23/22 0052 01/24/22 0306  NA 136 138 136  K 3.6 4.1 2.7*  CL 103 107 100  CO2 25 21* 28  GLUCOSE 172* 115* 114*  BUN '10 10 8  '$ CREATININE 1.15 1.14 1.22  CALCIUM 8.6* 8.6* 7.8*   LFT Recent Labs    01/22/22 1755 01/23/22 0052 01/24/22 0306  PROT 6.8 6.8 6.2*  ALBUMIN 3.0* 3.0* 2.7*  AST 40 40 38  ALT '16 17 15  '$ ALKPHOS 80 77 68  BILITOT 3.5* 3.2* 3.3*  BILIDIR 1.1*  --   --   IBILI 2.4*  --   --    PT/INR Recent Labs    01/23/22 0537  LABPROT 16.8*  INR 1.4*    Studies/Results: [place] ASCITES (ABDOMEN LIMITED)  Result Date: 01/23/2022 CLINICAL DATA:  Decompensated hepatic cirrhosis EXAM: LIMITED ABDOMEN ULTRASOUND FOR ASCITES TECHNIQUE: Limited ultrasound survey for ascites was performed in all four abdominal quadrants. COMPARISON:  CT chest abdomen pelvis 01/22/2022 FINDINGS: Scattered small volume ascites, greatest amounts perihepatic and perisplenic. Observed volume of ascites is insufficient for expected therapeutic benefit from paracentesis. IMPRESSION: Small volume ascites., insufficient for paracentesis. Electronically Signed   By: Lavonia Dana M.D.   On: 01/23/2022 09:37   CT Angio Chest PE W and/or Wo Contrast  Result Date: 01/22/2022 CLINICAL DATA:  Increasing shortness of breath on exertion for 1 week, began after receiving COVID booster shot. Underwent CT without contrast earlier today for unintentional weight loss. EXAM: CT ANGIOGRAPHY CHEST WITH CONTRAST TECHNIQUE: Multidetector CT imaging  of the chest was performed using the standard protocol during bolus administration of intravenous contrast. Multiplanar CT image reconstructions and MIPs were obtained to evaluate the vascular anatomy. RADIATION DOSE REDUCTION: This exam was performed according to the departmental dose-optimization program which includes automated exposure control, adjustment of the mA and/or kV according to patient size and/or use of iterative reconstruction technique. CONTRAST:   18m OMNIPAQUE IOHEXOL 350 MG/ML SOLN COMPARISON:  Chest CT without contrast earlier today, CTA chest 03/23/2021 FINDINGS: Cardiovascular: The cardiac size is normal. There are no visible coronary artery calcifications no pericardial effusion. Pulmonary arteries are normal caliber and free of thrombus through the segmental divisions, but the subsegmental arterial bed is not evaluated due to both bolus timing and respiratory motion artifacts. There is mild calcific atherosclerosis in the distal aortic arch, mild descending segment tortuosity without further atherosclerosis. There is no stenosis or dissection in the aorta and great vessels. The aortic root and ascending segment are slightly dilated however with the aortic root measuring 4 cm at the sinuses of Valsalva and the ascending segment 4.3 cm in diameter. This was seen previously. Mediastinum/Nodes: Small hiatal hernia. There is retained versus refluxed fluid in the esophagus to above the level of the aortic arch. There is no esophageal masslike thickening. No thyroid mass is seen or intrathoracic or axillary adenopathy. The trachea is clear. Lungs/Pleura: Lungs are clear. No pleural effusion or pneumothorax. Upper Abdomen: Again noted findings of hepatic cirrhosis, splenomegaly, moderate ascites and a prominent main portal vein measuring 2 cm. No interval change is seen from earlier today. Musculoskeletal: No acute or significant osseous findings. Mild thoracic spondylosis. There is bilateral gynecomastia of the diffuse fibroglandular form. Review of the MIP images confirms the above findings. IMPRESSION: 1. No evidence of arterial dilatation or embolus through the segmental divisions. The subsegmental arterial beds are not evaluated due to bolus timing and respiratory motion. 2. No acute chest CT or CTA findings. 3. Aortic root and ascending aortic dilatation, 4.3 cm in the ascending segment. No dissection or stenosis. Annual CTA or MRA follow-up  recommended. 4. Small hiatal hernia with retained versus refluxed fluid in the esophagus to above the level of the aortic arch. Aspiration precautions suggested. 5. Hepatic cirrhosis with splenomegaly, moderate ascites and a prominent main portal vein. No interval change from earlier today. 6. Gynecomastia. Electronically Signed   By: KTelford NabM.D.   On: 01/22/2022 21:07   CT CHEST ABDOMEN PELVIS WO CONTRAST  Result Date: 01/22/2022 CLINICAL DATA:  An intentional weight loss. EXAM: CT CHEST, ABDOMEN AND PELVIS WITHOUT CONTRAST TECHNIQUE: Multidetector CT imaging of the chest, abdomen and pelvis was performed following the standard protocol without IV contrast. RADIATION DOSE REDUCTION: This exam was performed according to the departmental dose-optimization program which includes automated exposure control, adjustment of the mA and/or kV according to patient size and/or use of iterative reconstruction technique. COMPARISON:  CT abdomen pelvis dated 09/20/2021. FINDINGS: Evaluation of this exam is limited in the absence of intravenous contrast. CT CHEST FINDINGS Cardiovascular: There is no cardiomegaly or pericardial effusion. The thoracic aorta and the central pulmonary arteries are grossly unremarkable on this noncontrast CT. Mediastinum/Nodes: No hilar or mediastinal adenopathy. The esophagus is grossly unremarkable. No mediastinal fluid collection. Lungs/Pleura: The lungs are clear. There is no pleural effusion or pneumothorax. The central airways are patent. Musculoskeletal: No acute osseous pathology. Bilateral gynecomastia. CT ABDOMEN PELVIS FINDINGS No intra-abdominal free air.  Small ascites. Hepatobiliary: Cirrhosis.  Cholecystectomy. Pancreas: The pancreas is grossly  unremarkable. Spleen: Splenomegaly measuring 23 cm in craniocaudal length. Adrenals/Urinary Tract: The adrenal glands unremarkable. There is a 3 mm nonobstructing right renal inferior pole calculus. No hydronephrosis. There is no  hydronephrosis or nephrolithiasis on the left. The urinary bladder is collapsed. Stomach/Bowel: Diffuse thickened appearance of the colon likely related to hepatic colopathy. Colitis is less likely. Clinical correlation is recommended. There is no bowel obstruction. The appendix is not identified with certainty. Vascular/Lymphatic: The abdominal aorta and IVC are grossly unremarkable on this noncontrast CT. No portal venous gas. There is no adenopathy. Reproductive: The prostate and seminal vesicles are grossly unremarkable. Other: Small fat containing left inguinal hernia. Left inguinal hernia repair mesh. There is a fat containing umbilical hernia. The neck of the hernia defect measures 4.5 cm in greatest axial diameter. There is diffuse subcutaneous edema. Musculoskeletal: Osteopenia with degenerative changes of the spine. No acute osseous pathology. IMPRESSION: 1. No acute intrathoracic pathology. 2. Cirrhosis with splenomegaly and small ascites. 3. Diffuse thickened appearance of the colon likely related to hepatic colopathy. Colitis is less likely. No bowel obstruction. 4. A 3 mm nonobstructing right renal inferior pole calculus. No hydronephrosis. Electronically Signed   By: Anner Crete M.D.   On: 01/22/2022 19:35   DG Chest 2 View  Result Date: 01/22/2022 CLINICAL DATA:  Shortness of breath. Body aches and chills. Dehydration. EXAM: CHEST - 2 VIEW COMPARISON:  Chest two views 11/30/2021 FINDINGS: Cardiac silhouette and mediastinal contours are within normal limits. The lungs are clear. No pleural effusion or pneumothorax. Minimal multilevel degenerative disc changes of the thoracic spine. IMPRESSION: No active cardiopulmonary disease. Electronically Signed   By: Yvonne Kendall M.D.   On: 01/22/2022 17:32    Assessment: Daniel Crawford is a 65 year old male with past medical history of Daniel Crawford cirrhosis complicated by ascites and hepatic encephalopathy, anxiety, CHF, CKD, depression, diabetes, hypertension,  hypothyroidism, who came to the hospital after presenting worsening weakness and fatigue with shortness of breath with wheezing and heart palpitations, GI consulted for decompensated cirrhosis.  Decompensated NASH Cirrhosis:  presenting with SOB, fatigue, worsening LEE and ascites. [place] yesterday with small amount of perisplenic and prehepatic ascites, insufficient for paracentesis. BLE edema extending into the thighs on admission, anasarca, fluid overload likely heavily influenced by CHF and sub optimal diuretic therapy. He is on diuretic therapy at home with both lasix and spironolactone '25mg'$  (hx of mastodynia previously with this). Was supposed to start eplerenone but did not. Cards had previously recommended 80 mg of Lasix BID however given patient's hesitancy he was advised to take twice daily dosing on Tuesdays and Saturdays and 100 mg total daily for the rest of the week.  Reportedly not taking full second dose sometimes on Tu/Sa. Spironolactone was increased to '50mg'$  daily yesterday, lasix at '60mg'$  BID, he feels that SOB is much improved, still with 2+ pitting edema to LEs and non pitting edema to thighs, abdomen is full but not taut. Hypokalemic at 2.7 today in light of increase diuretic therapy, received potassium supplementation this morning. Output of approximately 4760m since admission.  There is Consideration of TIPS procedure as fluid overload had been difficult to manage, case is currently being discussed with IR, Cardiology and our GI team to determine if patient is a potential candidate for TIPS evaluation, patient is amenable to this.   Denies confusion, pruritus. Maintained on xifaxan '500mg'$  BID outpatient, lactulose caused fecal urgency/incontinence in the past. EGD in 2021 w/o varices. Ammonia 40 on admission. MELD Na 18 (  with INR from yesterday), [facility], unfortunately not a candidate for liver transplant currently as previously evaluated at [facility] but BMI ruled him out for this,  however, fluid volume status is likely playing a large role in weight, at this time, unsure what true euvolemic weight is.    Colon polyps: Colonoscopy in April with tubular adenomas. Polyp in the cecum unable to be removed. In need of repeat colonoscopy which has been postponed due to fluid overload  Plan: Lasix '60mg'$  BID Spironolactone '50mg'$  Daily Potassium repletion per hospitalist  Consideration of TIPS procedure PPI daily Heart healthy, 2g sodium diet Continue Xifaxan '500mg'$  BID MELD labs Daily, will update INR today Outpatient colonoscopy in near future Monitor for signs of HE Strict I/Os and daily weights    LOS: 1 day    01/24/2022, 9:09 AM  Bianney Rockwood L. Alver Sorrow, MSN, APRN, AGNP-C Adult-Gerontology Nurse Practitioner [facility]

## 2022-01-24 NOTE — Progress Notes (Addendum)
Palliative: Daniel Crawford is resting quietly in bed.  He appears chronically ill, obese and frail.  He greets me, making and somewhat keeping eye contact.  He is alert and oriented x3, able to make his basic needs known.  We talk about his acute health concerns and the treatment plan.  Overall, Daniel Crawford is knowledgeable about his treatment plan.  We talk about transitioning back to by mouth diuretic.  We also talk about TIPS procedure.  He is able to and will follow-up with outpatient services as needed.  He tells me that he continues to worry about his 64 year old [place] at home.  He shares that he prefers to return home, anticipating discharge tomorrow.    He continues to be agreeable to outpatient palliative services, hospice care for "treat the treatable" care if qualified.  He shares that he feels that he needs more support at home.  He does have Medicaid, and feels that he would benefit from a CAP aide.    We talk about healthcare power of attorney again today.  I encouraged him to start discussions with friends and family.  Overall, he seems quite knowledgeable and ready to start this process.  He shares that he feels he needs a "backup", and I share that he is correct.  He declines chaplaincy services at this point.    Conference with attending, bedside nursing staff, transition of care team related to patient condition, needs, goals of care, disposition.  Plan:   At this point continue to treat the treatable, would except short-term life support, but no tracheostomy.  Declines short-term rehab even if qualified, prefers to return home with home health if needed.  Agreeable to outpatient palliative services.  Addendum: OPP with hospice of [facility].  42 minutes Daniel Axe, NP Palliative medicine team Team phone [phone] Greater than 50% of this time was spent counseling and coordinating care related to the above assessment and plan.

## 2022-01-24 NOTE — Progress Notes (Signed)
PROGRESS NOTE     Daniel Crawford, is a 66 y.o. male, DOB - 21-Oct-1955, DDU:[id number]  Admit date - 01/22/2022   Admitting Physician Murlean Iba, MD  Outpatient Primary MD for the patient is [name], MD  LOS - 1  Chief Complaint  Patient presents with   Shortness of Breath        Brief Narrative:  66 y.o. male with medical history significant of anxiety, CHF, CKD, depression, diabetes mellitus without complication, hypertension, hypothyroidism, Nash, splenomegaly, thrombocytopenia admitted on 01/23/2022 with Nash/cirrhosis decompensated with anasarca in the setting of congestive heart failure and chronic kidney disease--requiring additional diuresis after failing oral diuretics at home    -Assessment and Plan: 1)Nash/cirrhosis decompensated with anasarca in the setting of congestive heart failure and chronic kidney disease--- with oral diuretics at home -Continue IV Lasix, oral aldactone -Daily weights fluid input and output monitoring -IR consult for possible TIPS as outpatient -Paracentesis as clinically indicated -Rifaximin -GI consult appreciated  2)HTN---c/n aldactone, coreg and Lasix  3)Protein calorie malnutrition (HCC) - Albumin 3.0 -.  Nutrient dense food options - Liver cirrhosis likely contributing to low albumin -Dietary modifications discussed  D-dimer, elevated/dyspnea - Patient reported chest pressure and shortness of breath at presentation.  He was tachycardic as well.  D-dimer was done that was 19.85 - CTA chest has ruled out PE - Respiratory panel negative --This could be related to volume status Anticipate dyspnea and fatigue will improve with diuresis  Elevated TSH - TSH elevated in patient with hypothyroidism -Levothyroxine adjusted -TSH to be repeated in 6 weeks  Thrombocytopenia (HCC) - Secondary to liver cirrhosis -No bleeding concerns  Depression - With anxiety - Continue Zoloft and Vistaril - Continue trazodone for  sleep -Palliative care consult appreciated  Disposition/Need for in-Hospital Stay- patient unable to be discharged at this time due to -large liver cirrhosis with anasarca requiring IV diuresis after failing oral diuretics at home prior to admission*  Status is: Inpatient   Disposition: The patient is from: Home              Anticipated d/c is to: Home              Anticipated d/c date is: 2 days              Patient currently is not medically stable to d/c. Barriers: Not Clinically Stable-   Code Status :  -  Code Status: Full Code   Family Communication:    NA (patient is alert, awake and coherent)   DVT Prophylaxis  :   - SCDs   heparin injection 5,000 Units Start: 01/23/22 0600 SCDs Start: 01/23/22 0443   Lab Results  Component Value Date   PLT 83 (L) 01/24/2022    Inpatient Medications  Scheduled Meds:  Chlorhexidine Gluconate Cloth  6 each Topical Daily   furosemide  60 mg Intravenous BID   heparin  5,000 Units Subcutaneous Q8H   hydrOXYzine  50 mg Oral BID   levothyroxine  175 mcg Oral Q0600   pantoprazole  40 mg Oral Daily   rifaximin  550 mg Oral BID   sertraline  50 mg Oral QHS   spironolactone  50 mg Oral Daily   traZODone  50 mg Oral QHS   Continuous Infusions: PRN Meds:.ibuprofen, morphine injection, ondansetron **OR** ondansetron (ZOFRAN) IV, oxyCODONE   Anti-infectives (From admission, onward)    Start     Dose/Rate Route Frequency Ordered Stop   01/23/22 1000  rifaximin (XIFAXAN)  tablet 550 mg        550 mg Oral 2 times daily 01/23/22 0442           Subjective: [name] today has no fevers, no emesis,  No chest pain,   Voiding well -Dyspnea on exertion improving    Objective: Vitals:   01/24/22 0932 01/24/22 1057 01/24/22 1257 01/24/22 1600  BP:  (!) 147/66 (!) 150/83 132/66  Pulse:  78 92 79  Resp:  '18 16 19  '$ Temp:   98.2 F (36.8 C) 98.6 F (37 C)  TempSrc:   Oral Oral  SpO2:  97% 96% 97%  Weight: (!) 171.2 kg     Height:         Intake/Output Summary (Last 24 hours) at 01/24/2022 1755 Last data filed at 01/24/2022 1200 Gross per 24 hour  Intake 480 ml  Output 2375 ml  Net -1895 ml   Filed Weights   01/22/22 1702 01/24/22 0932  Weight: (!) 170.1 kg (!) 171.2 kg   Physical Exam  Gen:- Awake Alert, morbidly obese, in no apparent distress  HEENT:- [place].AT, No sclera icterus Neck-Supple Neck,No JVD,.  Lungs-patient bases, no wheezing  CV- S1, S2 normal, regular  Abd-  +ve B.Sounds, Abd Soft, No tenderness, increased truncal adiposity Extremity/Skin:-+ve  edema, pedal pulses present  Psych-affect is,flat, oriented x3 Neuro-generalized weakness, no new focal deficits, no tremors  Data Reviewed: I have personally reviewed following labs and imaging studies  CBC: Recent Labs  Lab 01/22/22 1755 01/23/22 0052 01/24/22 0306  WBC 4.3 4.5 3.3*  NEUTROABS  --  2.8  --   HGB 11.6* 11.6* 10.1*  HCT 35.3* 35.4* 29.8*  MCV 88.3 88.5 86.1  PLT 95* 95* 83*   Basic Metabolic Panel: Recent Labs  Lab 01/22/22 1755 01/23/22 0052 01/24/22 0306  NA 136 138 136  K 3.6 4.1 2.7*  CL 103 107 100  CO2 25 21* 28  GLUCOSE 172* 115* 114*  BUN '10 10 8  '$ CREATININE 1.15 1.14 1.22  CALCIUM 8.6* 8.6* 7.8*  MG 2.0 2.0 1.7   GFR: Estimated Creatinine Clearance: 103.3 mL/min (by C-G formula based on SCr of 1.22 mg/dL). Liver Function Tests: Recent Labs  Lab 01/22/22 1755 01/23/22 0052 01/24/22 0306  AST 40 40 38  ALT '16 17 15  '$ ALKPHOS 80 77 68  BILITOT 3.5* 3.2* 3.3*  PROT 6.8 6.8 6.2*  ALBUMIN 3.0* 3.0* 2.7*   Cardiac Enzymes: Recent Labs  Lab 01/22/22 1755  CKTOTAL 117   Recent Results (from the past 240 hour(s))  Resp Panel by RT-PCR (Flu A&B, Covid) Anterior Nasal Swab     Status: None   Collection Time: 01/22/22  6:58 PM   Specimen: Anterior Nasal Swab  Result Value Ref Range Status   SARS Coronavirus 2 by RT PCR NEGATIVE NEGATIVE Final    Comment: (NOTE) SARS-CoV-2 target nucleic acids are NOT  DETECTED.  The SARS-CoV-2 RNA is generally detectable in upper respiratory specimens during the acute phase of infection. The lowest concentration of SARS-CoV-2 viral copies this assay can detect is 138 copies/mL. A negative result does not preclude SARS-Cov-2 infection and should not be used as the sole basis for treatment or other patient management decisions. A negative result Volker occur with  improper specimen collection/handling, submission of specimen other than nasopharyngeal swab, presence of viral mutation(s) within the areas targeted by this assay, and inadequate number of viral copies(<138 copies/mL). A negative result must be combined with clinical observations, patient  history, and epidemiological information. The expected result is Negative.  Fact Sheet for Patients:  EntrepreneurPulse.com.au  Fact Sheet for Healthcare Providers:  IncredibleEmployment.be  This test is no t yet approved or cleared by the [place] FDA and  has been authorized for detection and/or diagnosis of SARS-CoV-2 by FDA under an Emergency Use Authorization (EUA). This EUA will remain  in effect (meaning this test can be used) for the duration of the COVID-19 declaration under Section 564(b)(1) of the Act, 21 U.S.C.section 360bbb-3(b)(1), unless the authorization is terminated  or revoked sooner.       Influenza A by PCR NEGATIVE NEGATIVE Final   Influenza B by PCR NEGATIVE NEGATIVE Final    Comment: (NOTE) The Xpert Xpress SARS-CoV-2/FLU/RSV plus assay is intended as an aid in the diagnosis of influenza from Nasopharyngeal swab specimens and should not be used as a sole basis for treatment. Nasal washings and aspirates are unacceptable for Xpert Xpress SARS-CoV-2/FLU/RSV testing.  Fact Sheet for Patients: EntrepreneurPulse.com.au  Fact Sheet for Healthcare Providers: IncredibleEmployment.be  This test is not yet  approved or cleared by the [place] FDA and has been authorized for detection and/or diagnosis of SARS-CoV-2 by FDA under an Emergency Use Authorization (EUA). This EUA will remain in effect (meaning this test can be used) for the duration of the COVID-19 declaration under Section 564(b)(1) of the Act, 21 U.S.C. section 360bbb-3(b)(1), unless the authorization is terminated or revoked.  Performed at [facility], [address], [place], [place] [address]   MRSA Next Gen by PCR, Nasal     Status: None   Collection Time: 01/23/22  6:19 AM   Specimen: Nasal Mucosa; Nasal Swab  Result Value Ref Range Status   MRSA by PCR Next Gen NOT DETECTED NOT DETECTED Final    Comment: (NOTE) The GeneXpert MRSA Assay (FDA approved for NASAL specimens only), is one component of a comprehensive MRSA colonization surveillance program. It is not intended to diagnose MRSA infection nor to guide or monitor treatment for MRSA infections. Test performance is not FDA approved in patients less than 59 years old. Performed at [facility], [address], [place],  [address]      Radiology Studies: [place] ASCITES (ABDOMEN LIMITED)  Result Date: 01/23/2022 CLINICAL DATA:  Decompensated hepatic cirrhosis EXAM: LIMITED ABDOMEN ULTRASOUND FOR ASCITES TECHNIQUE: Limited ultrasound survey for ascites was performed in all four abdominal quadrants. COMPARISON:  CT chest abdomen pelvis 01/22/2022 FINDINGS: Scattered small volume ascites, greatest amounts perihepatic and perisplenic. Observed volume of ascites is insufficient for expected therapeutic benefit from paracentesis. IMPRESSION: Small volume ascites., insufficient for paracentesis. Electronically Signed   By: Lavonia Dana M.D.   On: 01/23/2022 09:37   CT Angio Chest PE W and/or Wo Contrast  Result Date: 01/22/2022 CLINICAL DATA:  Increasing shortness of breath on exertion for 1 week, began after receiving COVID booster shot. Underwent CT without contrast  earlier today for unintentional weight loss. EXAM: CT ANGIOGRAPHY CHEST WITH CONTRAST TECHNIQUE: Multidetector CT imaging of the chest was performed using the standard protocol during bolus administration of intravenous contrast. Multiplanar CT image reconstructions and MIPs were obtained to evaluate the vascular anatomy. RADIATION DOSE REDUCTION: This exam was performed according to the departmental dose-optimization program which includes automated exposure control, adjustment of the mA and/or kV according to patient size and/or use of iterative reconstruction technique. CONTRAST:  172m OMNIPAQUE IOHEXOL 350 MG/ML SOLN COMPARISON:  Chest CT without contrast earlier today, CTA chest 03/23/2021 FINDINGS: Cardiovascular: The cardiac  size is normal. There are no visible coronary artery calcifications no pericardial effusion. Pulmonary arteries are normal caliber and free of thrombus through the segmental divisions, but the subsegmental arterial bed is not evaluated due to both bolus timing and respiratory motion artifacts. There is mild calcific atherosclerosis in the distal aortic arch, mild descending segment tortuosity without further atherosclerosis. There is no stenosis or dissection in the aorta and great vessels. The aortic root and ascending segment are slightly dilated however with the aortic root measuring 4 cm at the sinuses of Valsalva and the ascending segment 4.3 cm in diameter. This was seen previously. Mediastinum/Nodes: Small hiatal hernia. There is retained versus refluxed fluid in the esophagus to above the level of the aortic arch. There is no esophageal masslike thickening. No thyroid mass is seen or intrathoracic or axillary adenopathy. The trachea is clear. Lungs/Pleura: Lungs are clear. No pleural effusion or pneumothorax. Upper Abdomen: Again noted findings of hepatic cirrhosis, splenomegaly, moderate ascites and a prominent main portal vein measuring 2 cm. No interval change is seen from  earlier today. Musculoskeletal: No acute or significant osseous findings. Mild thoracic spondylosis. There is bilateral gynecomastia of the diffuse fibroglandular form. Review of the MIP images confirms the above findings. IMPRESSION: 1. No evidence of arterial dilatation or embolus through the segmental divisions. The subsegmental arterial beds are not evaluated due to bolus timing and respiratory motion. 2. No acute chest CT or CTA findings. 3. Aortic root and ascending aortic dilatation, 4.3 cm in the ascending segment. No dissection or stenosis. Annual CTA or MRA follow-up recommended. 4. Small hiatal hernia with retained versus refluxed fluid in the esophagus to above the level of the aortic arch. Aspiration precautions suggested. 5. Hepatic cirrhosis with splenomegaly, moderate ascites and a prominent main portal vein. No interval change from earlier today. 6. Gynecomastia. Electronically Signed   By: Telford Nab M.D.   On: 01/22/2022 21:07   CT CHEST ABDOMEN PELVIS WO CONTRAST  Result Date: 01/22/2022 CLINICAL DATA:  An intentional weight loss. EXAM: CT CHEST, ABDOMEN AND PELVIS WITHOUT CONTRAST TECHNIQUE: Multidetector CT imaging of the chest, abdomen and pelvis was performed following the standard protocol without IV contrast. RADIATION DOSE REDUCTION: This exam was performed according to the departmental dose-optimization program which includes automated exposure control, adjustment of the mA and/or kV according to patient size and/or use of iterative reconstruction technique. COMPARISON:  CT abdomen pelvis dated 09/20/2021. FINDINGS: Evaluation of this exam is limited in the absence of intravenous contrast. CT CHEST FINDINGS Cardiovascular: There is no cardiomegaly or pericardial effusion. The thoracic aorta and the central pulmonary arteries are grossly unremarkable on this noncontrast CT. Mediastinum/Nodes: No hilar or mediastinal adenopathy. The esophagus is grossly unremarkable. No mediastinal  fluid collection. Lungs/Pleura: The lungs are clear. There is no pleural effusion or pneumothorax. The central airways are patent. Musculoskeletal: No acute osseous pathology. Bilateral gynecomastia. CT ABDOMEN PELVIS FINDINGS No intra-abdominal free air.  Small ascites. Hepatobiliary: Cirrhosis.  Cholecystectomy. Pancreas: The pancreas is grossly unremarkable. Spleen: Splenomegaly measuring 23 cm in craniocaudal length. Adrenals/Urinary Tract: The adrenal glands unremarkable. There is a 3 mm nonobstructing right renal inferior pole calculus. No hydronephrosis. There is no hydronephrosis or nephrolithiasis on the left. The urinary bladder is collapsed. Stomach/Bowel: Diffuse thickened appearance of the colon likely related to hepatic colopathy. Colitis is less likely. Clinical correlation is recommended. There is no bowel obstruction. The appendix is not identified with certainty. Vascular/Lymphatic: The abdominal aorta and IVC are grossly unremarkable on this  noncontrast CT. No portal venous gas. There is no adenopathy. Reproductive: The prostate and seminal vesicles are grossly unremarkable. Other: Small fat containing left inguinal hernia. Left inguinal hernia repair mesh. There is a fat containing umbilical hernia. The neck of the hernia defect measures 4.5 cm in greatest axial diameter. There is diffuse subcutaneous edema. Musculoskeletal: Osteopenia with degenerative changes of the spine. No acute osseous pathology. IMPRESSION: 1. No acute intrathoracic pathology. 2. Cirrhosis with splenomegaly and small ascites. 3. Diffuse thickened appearance of the colon likely related to hepatic colopathy. Colitis is less likely. No bowel obstruction. 4. A 3 mm nonobstructing right renal inferior pole calculus. No hydronephrosis. Electronically Signed   By: Anner Crete M.D.   On: 01/22/2022 19:35     Scheduled Meds:  Chlorhexidine Gluconate Cloth  6 each Topical Daily   furosemide  60 mg Intravenous BID    heparin  5,000 Units Subcutaneous Q8H   hydrOXYzine  50 mg Oral BID   levothyroxine  175 mcg Oral Q0600   pantoprazole  40 mg Oral Daily   rifaximin  550 mg Oral BID   sertraline  50 mg Oral QHS   spironolactone  50 mg Oral Daily   traZODone  50 mg Oral QHS   Continuous Infusions:   LOS: 1 day   Roxan Hockey M.D on 01/24/2022 at 5:55 PM  Go to [email or web address] - for contact info  [facility] - Office  [phone]  If 7PM-7AM, please contact night-coverage [email or web address] 01/24/2022, 5:55 PM

## 2022-01-25 ENCOUNTER — Telehealth: Payer: Self-pay | Admitting: Gastroenterology

## 2022-01-25 ENCOUNTER — Ambulatory Visit (INDEPENDENT_AMBULATORY_CARE_PROVIDER_SITE_OTHER): Payer: Medicare HMO | Admitting: Gastroenterology

## 2022-01-25 DIAGNOSIS — K7460 Unspecified cirrhosis of liver: Secondary | ICD-10-CM | POA: Diagnosis not present

## 2022-01-25 DIAGNOSIS — R601 Generalized edema: Secondary | ICD-10-CM | POA: Diagnosis not present

## 2022-01-25 LAB — COMPREHENSIVE METABOLIC PANEL
ALT: 18 U/L (ref 0–44)
AST: 45 U/L — ABNORMAL HIGH (ref 15–41)
Albumin: 2.8 g/dL — ABNORMAL LOW (ref 3.5–5.0)
Alkaline Phosphatase: 69 U/L (ref 38–126)
Anion gap: 8 (ref 5–15)
BUN: 9 mg/dL (ref 8–23)
CO2: 31 mmol/L (ref 22–32)
Calcium: 8.2 mg/dL — ABNORMAL LOW (ref 8.9–10.3)
Chloride: 99 mmol/L (ref 98–111)
Creatinine, Ser: 1.29 mg/dL — ABNORMAL HIGH (ref 0.61–1.24)
GFR, Estimated: >60 mL/min (ref >60)
Glucose, Bld: 134 mg/dL — ABNORMAL HIGH (ref 70–99)
Potassium: 2.9 mmol/L — ABNORMAL LOW (ref 3.5–5.1)
Sodium: 138 mmol/L (ref 135–145)
Total Bilirubin: 3.4 mg/dL — ABNORMAL HIGH (ref 0.3–1.2)
Total Protein: 6.2 g/dL — ABNORMAL LOW (ref 6.5–8.1)

## 2022-01-25 LAB — T3, FREE: T3, Free: 1.6 pg/mL — ABNORMAL LOW (ref 2.0–4.4)

## 2022-01-25 MED ORDER — POTASSIUM CHLORIDE CRYS ER 20 MEQ PO TBCR
40.0000 meq | EXTENDED_RELEASE_TABLET | Freq: Once | ORAL | Status: AC
  Administered 2022-01-25: 40 meq via ORAL
  Filled 2022-01-25: qty 2

## 2022-01-25 MED ORDER — TRAZODONE HCL 50 MG PO TABS: 50.0000 mg | ORAL_TABLET | Freq: Every day | ORAL | 3 refills | Status: DC

## 2022-01-25 MED ORDER — FUROSEMIDE 20 MG PO TABS: 60.0000 mg | ORAL_TABLET | Freq: Two times a day (BID) | ORAL | 11 refills | Status: DC

## 2022-01-25 MED ORDER — POTASSIUM CHLORIDE CRYS ER 20 MEQ PO TBCR: 20.0000 meq | EXTENDED_RELEASE_TABLET | Freq: Every day | ORAL | 0 refills | Status: DC

## 2022-01-25 MED ORDER — SPIRONOLACTONE 50 MG PO TABS: 50.0000 mg | ORAL_TABLET | Freq: Every day | ORAL | 3 refills | Status: DC

## 2022-01-25 MED ORDER — PANTOPRAZOLE SODIUM 40 MG PO TBEC: 40.0000 mg | DELAYED_RELEASE_TABLET | Freq: Every day | ORAL | 3 refills | Status: AC

## 2022-01-25 MED ORDER — POTASSIUM CHLORIDE CRYS ER 20 MEQ PO TBCR
40.0000 meq | EXTENDED_RELEASE_TABLET | ORAL | Status: AC
  Administered 2022-01-25 (×2): 40 meq via ORAL
  Filled 2022-01-25 (×2): qty 2

## 2022-01-25 MED ORDER — CARVEDILOL 6.25 MG PO TABS: 6.2500 mg | ORAL_TABLET | Freq: Two times a day (BID) | ORAL | 3 refills | Status: DC

## 2022-01-25 MED ORDER — RIFAXIMIN 550 MG PO TABS: 550.0000 mg | ORAL_TABLET | Freq: Two times a day (BID) | ORAL | 3 refills | Status: DC

## 2022-01-25 MED ORDER — SERTRALINE HCL 50 MG PO TABS: 50.0000 mg | ORAL_TABLET | Freq: Every day | ORAL | 3 refills | Status: DC

## 2022-01-25 MED ORDER — HYDROXYZINE PAMOATE 50 MG PO CAPS: 50.0000 mg | ORAL_CAPSULE | Freq: Three times a day (TID) | ORAL | 2 refills | Status: DC | PRN

## 2022-01-25 NOTE — Telephone Encounter (Signed)
Referral has been placed in Epic for IR scheduler, they will contact patient with apt

## 2022-01-25 NOTE — Progress Notes (Signed)
Gastroenterology Progress Note   Referring Provider: No ref. provider found Primary Care Physician:  [name], MD Primary Gastroenterologist:  [name]  Patient ID: Daniel Crawford [id number]; 01/23/1956   Subjective:    Patient wants to go home. Says he is breathing better. No abdominal pain. Less swelling in the legs.   Objective:   Vital signs in last 24 hours: Temp:  [98.1 F (36.7 C)-98.6 F (37 C)] 98.2 F (36.8 C) (11/02 0329) Pulse Rate:  [67-92] 70 (11/02 0329) Resp:  [16-20] 20 (11/02 0329) BP: (116-150)/(60-83) 116/60 (11/02 0329) SpO2:  [94 %-97 %] 94 % (11/02 0329) Weight:  [169.3 kg-171.2 kg] 169.3 kg (11/02 0329) Last BM Date : 01/24/22 General:   Alert,  Well-developed, well-nourished, pleasant and cooperative in NAD Head:  Normocephalic and atraumatic. Eyes:  Sclera clear, no icterus.  Abdomen:  Soft, nontender and nondistended.  Normal bowel sounds, without guarding, and without rebound.  2+ pitting edema dependent flanks Extremities:  Without clubbing, deformity. 1-2+ pitting edema in lower extremities. Trace edema at knees.  Neurologic:  Alert and  oriented x4;  grossly normal neurologically. Skin:  Intact without significant lesions or rashes. Psych:  Alert and cooperative. Normal mood and affect.  Intake/Output from previous day: 11/01 0701 - 11/02 0700 In: 720 [P.O.:720] Out: 3700 [Urine:3700] Intake/Output this shift: No intake/output data recorded.  Lab Results: CBC Recent Labs    01/22/22 1755 01/23/22 0052 01/24/22 0306  WBC 4.3 4.5 3.3*  HGB 11.6* 11.6* 10.1*  HCT 35.3* 35.4* 29.8*  MCV 88.3 88.5 86.1  PLT 95* 95* 83*   BMET Recent Labs    01/23/22 0052 01/24/22 0306 01/25/22 0430  NA 138 136 138  K 4.1 2.7* 2.9*  CL 107 100 99  CO2 21* 28 31  GLUCOSE 115* 114* 134*  BUN '10 8 9  '$ CREATININE 1.14 1.22 1.29*  CALCIUM 8.6* 7.8* 8.2*   LFTs Recent Labs    01/22/22 1755 01/23/22 0052 01/24/22 0306  01/25/22 0430  BILITOT 3.5* 3.2* 3.3* 3.4*  BILIDIR 1.1*  --   --   --   IBILI 2.4*  --   --   --   ALKPHOS 80 77 68 69  AST 40 40 38 45*  ALT '16 17 15 18  '$ PROT 6.8 6.8 6.2* 6.2*  ALBUMIN 3.0* 3.0* 2.7* 2.8*   No results for input(s): "LIPASE" in the last 72 hours. PT/INR Recent Labs    01/23/22 0537 01/24/22 1159  LABPROT 16.8* 17.2*  INR 1.4* 1.4*         Imaging Studies: [place] ASCITES (ABDOMEN LIMITED)  Result Date: 01/23/2022 CLINICAL DATA:  Decompensated hepatic cirrhosis EXAM: LIMITED ABDOMEN ULTRASOUND FOR ASCITES TECHNIQUE: Limited ultrasound survey for ascites was performed in all four abdominal quadrants. COMPARISON:  CT chest abdomen pelvis 01/22/2022 FINDINGS: Scattered small volume ascites, greatest amounts perihepatic and perisplenic. Observed volume of ascites is insufficient for expected therapeutic benefit from paracentesis. IMPRESSION: Small volume ascites., insufficient for paracentesis. Electronically Signed   By: Lavonia Dana M.D.   On: 01/23/2022 09:37   CT Angio Chest PE W and/or Wo Contrast  Result Date: 01/22/2022 CLINICAL DATA:  Increasing shortness of breath on exertion for 1 week, began after receiving COVID booster shot. Underwent CT without contrast earlier today for unintentional weight loss. EXAM: CT ANGIOGRAPHY CHEST WITH CONTRAST TECHNIQUE: Multidetector CT imaging of the chest was performed using the standard protocol during bolus administration of intravenous contrast. Multiplanar CT image reconstructions and  MIPs were obtained to evaluate the vascular anatomy. RADIATION DOSE REDUCTION: This exam was performed according to the departmental dose-optimization program which includes automated exposure control, adjustment of the mA and/or kV according to patient size and/or use of iterative reconstruction technique. CONTRAST:  141m OMNIPAQUE IOHEXOL 350 MG/ML SOLN COMPARISON:  Chest CT without contrast earlier today, CTA chest 03/23/2021 FINDINGS:  Cardiovascular: The cardiac size is normal. There are no visible coronary artery calcifications no pericardial effusion. Pulmonary arteries are normal caliber and free of thrombus through the segmental divisions, but the subsegmental arterial bed is not evaluated due to both bolus timing and respiratory motion artifacts. There is mild calcific atherosclerosis in the distal aortic arch, mild descending segment tortuosity without further atherosclerosis. There is no stenosis or dissection in the aorta and great vessels. The aortic root and ascending segment are slightly dilated however with the aortic root measuring 4 cm at the sinuses of Valsalva and the ascending segment 4.3 cm in diameter. This was seen previously. Mediastinum/Nodes: Small hiatal hernia. There is retained versus refluxed fluid in the esophagus to above the level of the aortic arch. There is no esophageal masslike thickening. No thyroid mass is seen or intrathoracic or axillary adenopathy. The trachea is clear. Lungs/Pleura: Lungs are clear. No pleural effusion or pneumothorax. Upper Abdomen: Again noted findings of hepatic cirrhosis, splenomegaly, moderate ascites and a prominent main portal vein measuring 2 cm. No interval change is seen from earlier today. Musculoskeletal: No acute or significant osseous findings. Mild thoracic spondylosis. There is bilateral gynecomastia of the diffuse fibroglandular form. Review of the MIP images confirms the above findings. IMPRESSION: 1. No evidence of arterial dilatation or embolus through the segmental divisions. The subsegmental arterial beds are not evaluated due to bolus timing and respiratory motion. 2. No acute chest CT or CTA findings. 3. Aortic root and ascending aortic dilatation, 4.3 cm in the ascending segment. No dissection or stenosis. Annual CTA or MRA follow-up recommended. 4. Small hiatal hernia with retained versus refluxed fluid in the esophagus to above the level of the aortic arch.  Aspiration precautions suggested. 5. Hepatic cirrhosis with splenomegaly, moderate ascites and a prominent main portal vein. No interval change from earlier today. 6. Gynecomastia. Electronically Signed   By: KTelford NabM.D.   On: 01/22/2022 21:07   CT CHEST ABDOMEN PELVIS WO CONTRAST  Result Date: 01/22/2022 CLINICAL DATA:  An intentional weight loss. EXAM: CT CHEST, ABDOMEN AND PELVIS WITHOUT CONTRAST TECHNIQUE: Multidetector CT imaging of the chest, abdomen and pelvis was performed following the standard protocol without IV contrast. RADIATION DOSE REDUCTION: This exam was performed according to the departmental dose-optimization program which includes automated exposure control, adjustment of the mA and/or kV according to patient size and/or use of iterative reconstruction technique. COMPARISON:  CT abdomen pelvis dated 09/20/2021. FINDINGS: Evaluation of this exam is limited in the absence of intravenous contrast. CT CHEST FINDINGS Cardiovascular: There is no cardiomegaly or pericardial effusion. The thoracic aorta and the central pulmonary arteries are grossly unremarkable on this noncontrast CT. Mediastinum/Nodes: No hilar or mediastinal adenopathy. The esophagus is grossly unremarkable. No mediastinal fluid collection. Lungs/Pleura: The lungs are clear. There is no pleural effusion or pneumothorax. The central airways are patent. Musculoskeletal: No acute osseous pathology. Bilateral gynecomastia. CT ABDOMEN PELVIS FINDINGS No intra-abdominal free air.  Small ascites. Hepatobiliary: Cirrhosis.  Cholecystectomy. Pancreas: The pancreas is grossly unremarkable. Spleen: Splenomegaly measuring 23 cm in craniocaudal length. Adrenals/Urinary Tract: The adrenal glands unremarkable. There is a 3 mm  nonobstructing right renal inferior pole calculus. No hydronephrosis. There is no hydronephrosis or nephrolithiasis on the left. The urinary bladder is collapsed. Stomach/Bowel: Diffuse thickened appearance of the  colon likely related to hepatic colopathy. Colitis is less likely. Clinical correlation is recommended. There is no bowel obstruction. The appendix is not identified with certainty. Vascular/Lymphatic: The abdominal aorta and IVC are grossly unremarkable on this noncontrast CT. No portal venous gas. There is no adenopathy. Reproductive: The prostate and seminal vesicles are grossly unremarkable. Other: Small fat containing left inguinal hernia. Left inguinal hernia repair mesh. There is a fat containing umbilical hernia. The neck of the hernia defect measures 4.5 cm in greatest axial diameter. There is diffuse subcutaneous edema. Musculoskeletal: Osteopenia with degenerative changes of the spine. No acute osseous pathology. IMPRESSION: 1. No acute intrathoracic pathology. 2. Cirrhosis with splenomegaly and small ascites. 3. Diffuse thickened appearance of the colon likely related to hepatic colopathy. Colitis is less likely. No bowel obstruction. 4. A 3 mm nonobstructing right renal inferior pole calculus. No hydronephrosis. Electronically Signed   By: Anner Crete M.D.   On: 01/22/2022 19:35   DG Chest 2 View  Result Date: 01/22/2022 CLINICAL DATA:  Shortness of breath. Body aches and chills. Dehydration. EXAM: CHEST - 2 VIEW COMPARISON:  Chest two views 11/30/2021 FINDINGS: Cardiac silhouette and mediastinal contours are within normal limits. The lungs are clear. No pleural effusion or pneumothorax. Minimal multilevel degenerative disc changes of the thoracic spine. IMPRESSION: No active cardiopulmonary disease. Electronically Signed   By: Yvonne Kendall M.D.   On: 01/22/2022 17:32   CARDIAC CATHETERIZATION  Result Date: 01/08/2022   Hemodynamic findings consistent with mild pulmonary hypertension.   Noninvasive aortic saturation 99%, PA saturation 69%, PA pressure 36/20, mean PA pressure 28 mmHg, mean pulmonary capillary wedge pressure 19 mmHg, cardiac output 7.61 L/min, cardiac index 2.65. Still  with evidence of volume overload and mild pulmonary hypertension.  Continue medical management of diastolic heart failure.  [2 weeks]  Assessment:   Daniel Crawford is a 66 year old male with past medical history of [name] cirrhosis complicated by ascites and hepatic encephalopathy, anxiety, CHF, CKD, depression, diabetes, hypertension, hypothyroidism, who came to the hospital after presenting worsening weakness and fatigue with shortness of breath with wheezing and heart palpitations, GI consulted for decompensated cirrhosis.   Decompensated NASH Cirrhosis:  presenting with SOB, fatigue, worsening LEE and ascites. [place] with small amount of perisplenic and prehepatic ascites, insufficient for paracentesis. BLE edema extending into the thighs on admission, anasarca, fluid overload likely heavily influenced by CHF and suboptimal diuretic therapy. Improved edema at this time.   He is on diuretic therapy at home with both lasix and spironolactone '25mg'$  (hx of mastodynia previously with this). Was supposed to start eplerenone but did not. Cards had previously recommended 80 mg of Lasix BID however given patient's hesitancy he was advised to take twice daily dosing on Tuesdays and Saturdays and 100 mg total daily for the rest of the week.  Reportedly not taking full second dose sometimes on Tu/Sa. Spironolactone was increased to '50mg'$  daily yesterday, lasix at '60mg'$  BID, he feels that SOB is much improved, edema improved. Still with pitting edema in the dependent flanks. Hypokalemic at 2.9 today. Output of approximately 3741m last 24 hours.   There is consideration of TIPS procedure as fluid overload had been difficult to manage. It has been recommended that patient have outpatient evaluation by IR to discuss risks and benefits of TIPs with radiologist so  he could make more of an informed decision. Patient is agreeable.    Denies confusion, pruritus. Maintained on xifaxan '500mg'$  BID outpatient, lactulose caused fecal  urgency/incontinence in the past. EGD in 2021 w/o varices. Ammonia 40 on admission. MELD Na 18 (with INR from yesterday), Child Pugh B/C, unfortunately not a candidate for liver transplant currently as previously evaluated at [facility] but BMI ruled him out for this, however, fluid volume status is likely playing a large role in weight, at this time, unsure what true euvolemic weight is.     Colon polyps: Colonoscopy in April with tubular adenomas. Polyp in the cecum unable to be removed. In need of repeat colonoscopy which has been postponed due to fluid overload.    Plan:   Continue current diuretic regimen, lasix '60mg'$  BID, spironolactone '50mg'$  daily.  Potassium repletion per attending.  Continue 2 gram sodium diet.  Close outpatient follow up for cirrhosis care.  Consider outpatient colonoscopy per primary GI.  Continue PPI daily.  Continue Xifaxan '550mg'$  BID.  Patient desires going home today. From a fluid status he is improved.    LOS: 2 days   Laureen Ochs. Bernarda Caffey [facility] [phone] 11/2/20238:26 AM

## 2022-01-25 NOTE — TOC Transition Note (Signed)
Transition of Care [facility]) - CM/SW Discharge Note   Patient Details  Name: Daniel Crawford MRN: [id number] Date of Birth: 05-09-1955  Transition of Care [facility] CM/SW Contact:  Ihor Gully, LCSW Phone Number: 01/25/2022, 1:30 PM   Clinical Narrative:    Per palliative care NP, Aniceto Boss, patient request to be referred to [facility] Palliative services.     Barriers to Discharge: No Barriers Identified   Patient Goals and CMS Choice Patient states their goals for this hospitalization and ongoing recovery are:: d/c home      Discharge Placement                       Discharge Plan and Services                                     Social Determinants of Health (SDOH) Interventions     Readmission Risk Interventions     No data to display

## 2022-01-25 NOTE — Discharge Instructions (Signed)
1)Very low-salt diet advised 2)Weigh yourself daily, call if you gain more than 3 pounds in 1 day or more than 5 pounds in 1 week as your diuretic medications [place] need to be adjusted 3)Follow-up Gastroenterologist Dr. Gala Romney with [facility]--- for evaluation  -address: [address], [place], [place] [address], Phone: [phone] 4)Avoid ibuprofen/Advil/Aleve/Motrin/Goody Powders/Naproxen/BC powders/Meloxicam/Diclofenac/Indomethacin and other Nonsteroidal anti-inflammatory medications as these will make you more likely to bleed and can cause stomach ulcers, can also cause Kidney problems.  4)Repeat CMP and CBC around Tuesday, 01/30/2022 5)Gastroenterology team will help you with navigate referral to interventional radiology for evaluation for possible TIPS procedure  6)Gastroenterology team will discuss with to about possibly doing a colonoscopy and upper endoscopy soon

## 2022-01-25 NOTE — Care Management Important Message (Signed)
Important Message  Patient Details  Name: Daniel Crawford MRN: [id number] Date of Birth: [birth year]   Medicare Important Message Given:  N/A - LOS <3 / Initial given by admissions     Tommy Medal 01/25/2022, 12:59 PM

## 2022-01-25 NOTE — Plan of Care (Signed)
  Problem: Cardiovascular: Goal: Vascular access site(s) Level 0-1 will be maintained Outcome: Not Progressing   Problem: Cardiovascular: Goal: Ability to achieve and maintain adequate cardiovascular perfusion will improve Outcome: Not Progressing

## 2022-01-25 NOTE — Telephone Encounter (Signed)
Please schedule hospital follow up with Dr. Jenetta Downer or [place], 4-6 weeks.   Please schedule IR consultation for possible TIPs placement, was advised while inpatient.

## 2022-01-25 NOTE — Progress Notes (Signed)
Patient stable and ready to discharge home. Patient's IV removed. Writer went over discharge paperwork with patient and he verbalized understanding.  Patient transported via [facility] to his car.

## 2022-01-25 NOTE — Discharge Summary (Signed)
Daniel Crawford, is a 66 y.o. male  DOB 1955/08/06  MRN [id number].  Admission date:  01/22/2022  Admitting Physician  Murlean Iba, MD  Discharge Date:  01/25/2022   Primary MD  [name], MD  Recommendations for primary care physician for things to follow:   1)Very low-salt diet advised 2)Weigh yourself daily, call if you gain more than 3 pounds in 1 day or more than 5 pounds in 1 week as your diuretic medications Guarnieri need to be adjusted 3)Follow-up Gastroenterologist Dr. Gala Romney with [facility]--- for evaluation  -address: [address] [place], [place] [address], Phone: [phone] 4)Avoid ibuprofen/Advil/Aleve/Motrin/Goody Powders/Naproxen/BC powders/Meloxicam/Diclofenac/Indomethacin and other Nonsteroidal anti-inflammatory medications as these will make you more likely to bleed and can cause stomach ulcers, can also cause Kidney problems.  4)Repeat CMP and CBC around Tuesday, 01/30/2022 5)Gastroenterology team will help you with navigate referral to interventional radiology for evaluation for possible TIPS procedure  6)Gastroenterology team will discuss with to about possibly doing a colonoscopy and upper endoscopy soon  Admission Diagnosis  Anasarca [R60.1] Other ascites [R18.8] Exercise intolerance [R68.89] Hypervolemia, unspecified hypervolemia type [E87.70] Volume overload [E87.70]   Discharge Diagnosis  Anasarca [R60.1] Other ascites [R18.8] Exercise intolerance [R68.89] Hypervolemia, unspecified hypervolemia type [E87.70] Volume overload [E87.70]    Principal Problem:   Anasarca Active Problems:   Liver cirrhosis secondary to NASH ([place])   Depression   Thrombocytopenia (HCC)   Elevated TSH   D-dimer, elevated   Protein calorie malnutrition (HCC)   Generalized weakness   Essential hypertension   Hypervolemia   Volume overload      Past Medical  History:  Diagnosis Date   Anemia    Anxiety    Asthma    CHF (congestive heart failure) ([place])    CKD (chronic kidney disease)    Depression    Diabetes mellitus without complication ([place])    Dyspnea    Hypertension    Hypothyroidism    Liver cirrhosis secondary to NASH ([place])    NASH (nonalcoholic steatohepatitis)    Pre-diabetes    Spleen enlarged    Thrombocytopenia ([place])     Past Surgical History:  Procedure Laterality Date   Bilateral hernia surgery     2006, 2007   CHOLECYSTECTOMY     2016    COLONOSCOPY WITH PROPOFOL N/A 06/28/2021   Procedure: COLONOSCOPY WITH PROPOFOL;  Surgeon: Rogene Houston, MD;  [facility];  Service: Endoscopy;  Laterality: N/A;  1020   ESOPHAGEAL DILATION N/A 11/06/2019   Procedure: ESOPHAGEAL DILATION;  Surgeon: Harvel Quale, MD;  [facility];  Service: Gastroenterology;  Laterality: N/A;   ESOPHAGOGASTRODUODENOSCOPY (EGD) WITH PROPOFOL N/A 11/06/2019   Procedure: ESOPHAGOGASTRODUODENOSCOPY (EGD) WITH PROPOFOL;  Surgeon: Harvel Quale, MD;  [facility];  Service: Gastroenterology;  Laterality: N/A;  [place] removed     in January of 2018.    POLYPECTOMY  06/28/2021   Procedure: POLYPECTOMY  INTESTINAL;  Surgeon: Rogene Houston, MD;  [facility];  Service: Endoscopy;;   RIGHT HEART CATH N/A 01/08/2022   Procedure: RIGHT HEART CATH;  Surgeon: Jettie Booze, MD;  Location: [place] [facility];  Service: Cardiovascular;  Laterality: N/A;     HPI  from the history and physical done on the day of admission:   HPI: Daniel Crawford is a 66 y.o. male with medical history significant of anxiety, CHF, CKD, depression, diabetes mellitus without complication, hypertension, hypothyroidism, Nash, splenomegaly, thrombocytopenia, and more presents to ED with a chief complaint of abdominal and chest pain.  Patient reports he feels like  something is been sitting on his abdomen and chest.  He reports his whole chest up to his neck and his whole abdomen down to his pelvis feels miserable.  This started 11 days ago after he had his flu shot.  Patient reports he has had no appetite and has not been able to eat anything.  He had no nausea or vomiting.  He denies fevers as well.  He reports he had 1 day of diarrhea.  He had 3 episodes of diarrhea in that day.  It was a couple of days ago.  As he could tell there was no hematochezia or melena.  Patient reports that he has felt generalized weakness, malaise, fatigue.  He reports that he has been wheezing at home, and had palpitations.  He can feel better if we can get the fluid off.  He has never had a paracentesis, but is hoping that he would be eligible now.  Patient reports otherwise he has been in his normal state of health.   Patient does not smoke, does not drink, does not use illicit drugs.  He is vaccinated for COVID.  Patient is full code. Review of Systems: As mentioned in the history of present illness. All other systems reviewed and are negative.     Hospital Course:     66 y.o. male with medical history significant of anxiety, CHF, CKD, depression, diabetes mellitus without complication, hypertension, hypothyroidism, [name], splenomegaly, thrombocytopenia admitted on 01/23/2022 with Nash/cirrhosis decompensated with anasarca in the setting of congestive heart failure and chronic kidney disease--requiring additional diuresis after failing oral diuretics at home     -Assessment and Plan: 1)Nash/cirrhosis decompensated with anasarca in the setting of congestive heart failure and chronic kidney disease--- with oral diuretics at home -Improved with IV Lasix and oral Aldactone -IR consult for possible TIPS as outpatient  -Continue rifaximin -GI consult appreciated   2)HTN---c/n aldactone, coreg and Lasix   3)Protein calorie malnutrition (HCC) - Albumin 3.0 -.  Nutrient dense  food options - Liver cirrhosis likely contributing to low albumin -Dietary modifications discussed   D-dimer, elevated/dyspnea - Patient reported chest pressure and shortness of breath at presentation.  He was tachycardic as well.  D-dimer was done that was 19.85 - CTA chest has ruled out PE - Respiratory panel negative --This could be related to volume status Much improved dyspnea after diuresis   Elevated TSH - TSH elevated in patient with hypothyroidism -Levothyroxine adjusted -TSH to be repeated in 6 weeks as outpatient   Thrombocytopenia ([place]) - Secondary to liver cirrhosis -No bleeding concerns   Depression - With anxiety - Continue Zoloft and Vistaril - Continue trazodone for sleep -Palliative care consult appreciated   Disposition: The patient is from: Home              Anticipated d/c is to: Home  Discharge  Condition: Stable  Follow UP   Follow-up Information     Rourk, Cristopher Estimable, MD. Schedule an appointment as soon as possible for a visit in 4 week(s).   Specialty: Gastroenterology Contact information: [place] [address] [phone]         [name], MD. Schedule an appointment as soon as possible for a visit on 01/30/2022.   Specialties: Family Medicine, Sports Medicine Why: for Repeat CMP and CBC blood tests Contact information: [address] [place] [address] [phone]                  Consults obtained -GI  Diet and Activity recommendation:  As advised  Discharge Instructions   Discharge Instructions     Call MD for:  difficulty breathing, headache or visual disturbances   Complete by: As directed    Call MD for:  persistant dizziness or light-headedness   Complete by: As directed    Call MD for:  persistant nausea and vomiting   Complete by: As directed    Call MD for:  severe uncontrolled pain   Complete by: As directed    Call MD for:  temperature >100.4   Complete by: As directed     Diet - low sodium heart healthy   Complete by: As directed    Discharge instructions   Complete by: As directed    1)Very low-salt diet advised 2)Weigh yourself daily, call if you gain more than 3 pounds in 1 day or more than 5 pounds in 1 week as your diuretic medications Takahashi need to be adjusted 3)Follow-up Gastroenterologist Dr. Gala Romney with [facility]--- for evaluation  -address: [address] [place], [place] [address], Phone: [phone] 4)Avoid ibuprofen/Advil/Aleve/Motrin/Goody Powders/Naproxen/BC powders/Meloxicam/Diclofenac/Indomethacin and other Nonsteroidal anti-inflammatory medications as these will make you more likely to bleed and can cause stomach ulcers, can also cause Kidney problems.  4)Repeat CMP and CBC around Tuesday, 01/30/2022 5)Gastroenterology team will help you with navigate referral to interventional radiology for evaluation for possible TIPS procedure  6)Gastroenterology team will discuss with to about possibly doing a colonoscopy and upper endoscopy soon   Increase activity slowly   Complete by: As directed        Discharge Medications     Allergies as of 01/25/2022   No Known Allergies      Medication List     STOP taking these medications    polyethylene glycol-electrolytes 420 g solution Commonly known as: TriLyte       TAKE these medications    acetaminophen 500 MG tablet Commonly known as: TYLENOL Take 1,000 mg by mouth every 6 (six) hours as needed for moderate pain.   albuterol 108 (90 Base) MCG/ACT inhaler Commonly known as: VENTOLIN HFA TAKE 2 PUFFS BY MOUTH EVERY 6 HOURS AS NEEDED FOR WHEEZE OR SHORTNESS OF BREATH What changed: See the new instructions.   carvedilol 6.25 MG tablet Commonly known as: COREG Take 1 tablet (6.25 mg total) by mouth 2 (two) times daily with a meal. What changed:  medication strength how much to take   furosemide 20 MG tablet Commonly known as: Lasix Take 3 tablets (60 mg  total) by mouth 2 (two) times daily. What changed:  medication strength how much to take when to take this Another medication with the same name was removed. Continue taking this medication, and follow the directions you see here.   gabapentin 100 MG capsule Commonly known as: NEURONTIN Take 100 mg by mouth  daily as needed (pain).   hydrOXYzine 50 MG capsule Commonly known as: VISTARIL Take 1 capsule (50 mg total) by mouth 3 (three) times daily as needed for anxiety or nausea. What changed:  when to take this reasons to take this   levothyroxine 175 MCG tablet Commonly known as: SYNTHROID Take 175 mcg by mouth daily before breakfast.   ondansetron 8 MG tablet Commonly known as: ZOFRAN Take 8 mg by mouth every 8 (eight) hours as needed for nausea or vomiting.   pantoprazole 40 MG tablet Commonly known as: PROTONIX Take 1 tablet (40 mg total) by mouth daily.   potassium chloride SA 20 MEQ tablet Commonly known as: KLOR-CON M Take 1 tablet (20 mEq total) by mouth daily. What changed: when to take this   rifaximin 550 MG Tabs tablet Commonly known as: XIFAXAN Take 1 tablet (550 mg total) by mouth 2 (two) times daily.   sertraline 50 MG tablet Commonly known as: ZOLOFT Take 1 tablet (50 mg total) by mouth at bedtime.   spironolactone 50 MG tablet Commonly known as: ALDACTONE Take 1 tablet (50 mg total) by mouth daily. Start taking on: January 26, 2022 What changed:  medication strength how much to take when to take this   traZODone 50 MG tablet Commonly known as: DESYREL Take 1 tablet (50 mg total) by mouth at bedtime.       Major procedures and Radiology Reports - PLEASE review detailed and final reports for all details, in brief -   [place] ASCITES (ABDOMEN LIMITED)  Result Date: 01/23/2022 CLINICAL DATA:  Decompensated hepatic cirrhosis EXAM: LIMITED ABDOMEN ULTRASOUND FOR ASCITES TECHNIQUE: Limited ultrasound survey for ascites was performed in all four  abdominal quadrants. COMPARISON:  CT chest abdomen pelvis 01/22/2022 FINDINGS: Scattered small volume ascites, greatest amounts perihepatic and perisplenic. Observed volume of ascites is insufficient for expected therapeutic benefit from paracentesis. IMPRESSION: Small volume ascites., insufficient for paracentesis. Electronically Signed   By: Lavonia Dana M.D.   On: 01/23/2022 09:37   CT Angio Chest PE W and/or Wo Contrast  Result Date: 01/22/2022 CLINICAL DATA:  Increasing shortness of breath on exertion for 1 week, began after receiving COVID booster shot. Underwent CT without contrast earlier today for unintentional weight loss. EXAM: CT ANGIOGRAPHY CHEST WITH CONTRAST TECHNIQUE: Multidetector CT imaging of the chest was performed using the standard protocol during bolus administration of intravenous contrast. Multiplanar CT image reconstructions and MIPs were obtained to evaluate the vascular anatomy. RADIATION DOSE REDUCTION: This exam was performed according to the departmental dose-optimization program which includes automated exposure control, adjustment of the mA and/or kV according to patient size and/or use of iterative reconstruction technique. CONTRAST:  123m OMNIPAQUE IOHEXOL 350 MG/ML SOLN COMPARISON:  Chest CT without contrast earlier today, CTA chest 03/23/2021 FINDINGS: Cardiovascular: The cardiac size is normal. There are no visible coronary artery calcifications no pericardial effusion. Pulmonary arteries are normal caliber and free of thrombus through the segmental divisions, but the subsegmental arterial bed is not evaluated due to both bolus timing and respiratory motion artifacts. There is mild calcific atherosclerosis in the distal aortic arch, mild descending segment tortuosity without further atherosclerosis. There is no stenosis or dissection in the aorta and great vessels. The aortic root and ascending segment are slightly dilated however with the aortic root measuring 4 cm at the  sinuses of Valsalva and the ascending segment 4.3 cm in diameter. This was seen previously. Mediastinum/Nodes: Small hiatal hernia. There is retained versus refluxed fluid in the  esophagus to above the level of the aortic arch. There is no esophageal masslike thickening. No thyroid mass is seen or intrathoracic or axillary adenopathy. The trachea is clear. Lungs/Pleura: Lungs are clear. No pleural effusion or pneumothorax. Upper Abdomen: Again noted findings of hepatic cirrhosis, splenomegaly, moderate ascites and a prominent main portal vein measuring 2 cm. No interval change is seen from earlier today. Musculoskeletal: No acute or significant osseous findings. Mild thoracic spondylosis. There is bilateral gynecomastia of the diffuse fibroglandular form. Review of the MIP images confirms the above findings. IMPRESSION: 1. No evidence of arterial dilatation or embolus through the segmental divisions. The subsegmental arterial beds are not evaluated due to bolus timing and respiratory motion. 2. No acute chest CT or CTA findings. 3. Aortic root and ascending aortic dilatation, 4.3 cm in the ascending segment. No dissection or stenosis. Annual CTA or MRA follow-up recommended. 4. Small hiatal hernia with retained versus refluxed fluid in the esophagus to above the level of the aortic arch. Aspiration precautions suggested. 5. Hepatic cirrhosis with splenomegaly, moderate ascites and a prominent main portal vein. No interval change from earlier today. 6. Gynecomastia. Electronically Signed   By: Telford Nab M.D.   On: 01/22/2022 21:07   CT CHEST ABDOMEN PELVIS WO CONTRAST  Result Date: 01/22/2022 CLINICAL DATA:  An intentional weight loss. EXAM: CT CHEST, ABDOMEN AND PELVIS WITHOUT CONTRAST TECHNIQUE: Multidetector CT imaging of the chest, abdomen and pelvis was performed following the standard protocol without IV contrast. RADIATION DOSE REDUCTION: This exam was performed according to the departmental  dose-optimization program which includes automated exposure control, adjustment of the mA and/or kV according to patient size and/or use of iterative reconstruction technique. COMPARISON:  CT abdomen pelvis dated 09/20/2021. FINDINGS: Evaluation of this exam is limited in the absence of intravenous contrast. CT CHEST FINDINGS Cardiovascular: There is no cardiomegaly or pericardial effusion. The thoracic aorta and the central pulmonary arteries are grossly unremarkable on this noncontrast CT. Mediastinum/Nodes: No hilar or mediastinal adenopathy. The esophagus is grossly unremarkable. No mediastinal fluid collection. Lungs/Pleura: The lungs are clear. There is no pleural effusion or pneumothorax. The central airways are patent. Musculoskeletal: No acute osseous pathology. Bilateral gynecomastia. CT ABDOMEN PELVIS FINDINGS No intra-abdominal free air.  Small ascites. Hepatobiliary: Cirrhosis.  Cholecystectomy. Pancreas: The pancreas is grossly unremarkable. Spleen: Splenomegaly measuring 23 cm in craniocaudal length. Adrenals/Urinary Tract: The adrenal glands unremarkable. There is a 3 mm nonobstructing right renal inferior pole calculus. No hydronephrosis. There is no hydronephrosis or nephrolithiasis on the left. The urinary bladder is collapsed. Stomach/Bowel: Diffuse thickened appearance of the colon likely related to hepatic colopathy. Colitis is less likely. Clinical correlation is recommended. There is no bowel obstruction. The appendix is not identified with certainty. Vascular/Lymphatic: The abdominal aorta and IVC are grossly unremarkable on this noncontrast CT. No portal venous gas. There is no adenopathy. Reproductive: The prostate and seminal vesicles are grossly unremarkable. Other: Small fat containing left inguinal hernia. Left inguinal hernia repair mesh. There is a fat containing umbilical hernia. The neck of the hernia defect measures 4.5 cm in greatest axial diameter. There is diffuse subcutaneous  edema. Musculoskeletal: Osteopenia with degenerative changes of the spine. No acute osseous pathology. IMPRESSION: 1. No acute intrathoracic pathology. 2. Cirrhosis with splenomegaly and small ascites. 3. Diffuse thickened appearance of the colon likely related to hepatic colopathy. Colitis is less likely. No bowel obstruction. 4. A 3 mm nonobstructing right renal inferior pole calculus. No hydronephrosis. Electronically Signed   By: Milas Hock  Radparvar M.D.   On: 01/22/2022 19:35   DG Chest 2 View  Result Date: 01/22/2022 CLINICAL DATA:  Shortness of breath. Body aches and chills. Dehydration. EXAM: CHEST - 2 VIEW COMPARISON:  Chest two views 11/30/2021 FINDINGS: Cardiac silhouette and mediastinal contours are within normal limits. The lungs are clear. No pleural effusion or pneumothorax. Minimal multilevel degenerative disc changes of the thoracic spine. IMPRESSION: No active cardiopulmonary disease. Electronically Signed   By: Yvonne Kendall M.D.   On: 01/22/2022 17:32   CARDIAC CATHETERIZATION  Result Date: 01/08/2022   Hemodynamic findings consistent with mild pulmonary hypertension.   Noninvasive aortic saturation 99%, PA saturation 69%, PA pressure 36/20, mean PA pressure 28 mmHg, mean pulmonary capillary wedge pressure 19 mmHg, cardiac output 7.61 L/min, cardiac index 2.65. Still with evidence of volume overload and mild pulmonary hypertension.  Continue medical management of diastolic heart failure.    Micro Results  Recent Results (from the past 240 hour(s))  Resp Panel by RT-PCR (Flu A&B, Covid) Anterior Nasal Swab     Status: None   Collection Time: 01/22/22  6:58 PM   Specimen: Anterior Nasal Swab  Result Value Ref Range Status   SARS Coronavirus 2 by RT PCR NEGATIVE NEGATIVE Final    Comment: (NOTE) SARS-CoV-2 target nucleic acids are NOT DETECTED.  The SARS-CoV-2 RNA is generally detectable in upper respiratory specimens during the acute phase of infection. The lowest concentration  of SARS-CoV-2 viral copies this assay can detect is 138 copies/mL. A negative result does not preclude SARS-Cov-2 infection and should not be used as the sole basis for treatment or other patient management decisions. A negative result [place] occur with  improper specimen collection/handling, submission of specimen other than nasopharyngeal swab, presence of viral mutation(s) within the areas targeted by this assay, and inadequate number of viral copies(<138 copies/mL). A negative result must be combined with clinical observations, patient history, and epidemiological information. The expected result is Negative.  Fact Sheet for Patients:  EntrepreneurPulse.com.au  Fact Sheet for Healthcare Providers:  IncredibleEmployment.be  This test is no t yet approved or cleared by the [place] FDA and  has been authorized for detection and/or diagnosis of SARS-CoV-2 by FDA under an Emergency Use Authorization (EUA). This EUA will remain  in effect (meaning this test can be used) for the duration of the COVID-19 declaration under Section 564(b)(1) of the Act, 21 U.S.C.section 360bbb-3(b)(1), unless the authorization is terminated  or revoked sooner.       Influenza A by PCR NEGATIVE NEGATIVE Final   Influenza B by PCR NEGATIVE NEGATIVE Final    Comment: (NOTE) The Xpert Xpress SARS-CoV-2/FLU/RSV plus assay is intended as an aid in the diagnosis of influenza from Nasopharyngeal swab specimens and should not be used as a sole basis for treatment. Nasal washings and aspirates are unacceptable for Xpert Xpress SARS-CoV-2/FLU/RSV testing.  Fact Sheet for Patients: EntrepreneurPulse.com.au  Fact Sheet for Healthcare Providers: IncredibleEmployment.be  This test is not yet approved or cleared by the [place] FDA and has been authorized for detection and/or diagnosis of SARS-CoV-2 by FDA under an Emergency Use  Authorization (EUA). This EUA will remain in effect (meaning this test can be used) for the duration of the COVID-19 declaration under Section 564(b)(1) of the Act, 21 U.S.C. section 360bbb-3(b)(1), unless the authorization is terminated or revoked.  Performed at [facility], [address], [place], [place] [address]   MRSA Next Gen by PCR, Nasal     Status: None  Collection Time: 01/23/22  6:19 AM   Specimen: Nasal Mucosa; Nasal Swab  Result Value Ref Range Status   MRSA by PCR Next Gen NOT DETECTED NOT DETECTED Final    Comment: (NOTE) The GeneXpert MRSA Assay (FDA approved for NASAL specimens only), is one component of a comprehensive MRSA colonization surveillance program. It is not intended to diagnose MRSA infection nor to guide or monitor treatment for MRSA infections. Test performance is not FDA approved in patients less than 71 years old. Performed at [facility], [address], [place], [place] [address]     Today   Subjective    Jahkai Winterbottom today has no new complaints -Voiding really well -Dyspnea on exertion pretty much resolved -Had BMs      Patient has been seen and examined prior to discharge   Objective   Blood pressure (!) 115/54, pulse 61, temperature 97.6 F (36.4 C), temperature source Oral, resp. rate 20, height '6\' 2"'$  (1.88 m), weight (!) 169.3 kg, SpO2 100 %.   Intake/Output Summary (Last 24 hours) at 01/25/2022 1708 Last data filed at 01/25/2022 1300 Gross per 24 hour  Intake 780 ml  Output 4100 ml  Net -3320 ml    Exam Gen:- Awake Alert, morbidly obese, in no apparent distress  HEENT:- [place].AT, No sclera icterus Neck-Supple Neck,No JVD,.  Lungs-fair air movement, no wheezing CV- S1, S2 normal, regular  Abd-  +ve B.Sounds, Abd Soft, No tenderness, increased truncal adiposity Extremity/Skin: Mild improved e  edema, pedal pulses present  Psych-affect is appropriate,, oriented x3 Neuro-no new focal deficits, no tremors   Data Review   CBC w  Diff:  Lab Results  Component Value Date   WBC 3.3 (L) 01/24/2022   HGB 10.1 (L) 01/24/2022   HGB 12.3 (L) 12/27/2021   HCT 29.8 (L) 01/24/2022   HCT 36.5 (L) 12/27/2021   PLT 83 (L) 01/24/2022   PLT 63 (LL) 12/27/2021   LYMPHOPCT 21 01/23/2022   MONOPCT 11 01/23/2022   EOSPCT 5 01/23/2022   BASOPCT 1 01/23/2022    CMP:  Lab Results  Component Value Date   NA 138 01/25/2022   NA 143 12/27/2021   K 2.9 (L) 01/25/2022   CL 99 01/25/2022   CO2 31 01/25/2022   BUN 9 01/25/2022   BUN 14 12/27/2021   CREATININE 1.29 (H) 01/25/2022   CREATININE 1.27 08/15/2021   PROT 6.2 (L) 01/25/2022   ALBUMIN 2.8 (L) 01/25/2022   BILITOT 3.4 (H) 01/25/2022   ALKPHOS 69 01/25/2022   AST 45 (H) 01/25/2022   ALT 18 01/25/2022  .  Total Discharge time is about 33 minutes  Roxan Hockey M.D on 01/25/2022 at 5:08 PM  Go to [email or web address] -  for contact info  [facility] - Office  [phone]

## 2022-01-29 ENCOUNTER — Other Ambulatory Visit: Payer: Self-pay | Admitting: Gastroenterology

## 2022-01-29 DIAGNOSIS — K7469 Other cirrhosis of liver: Secondary | ICD-10-CM

## 2022-01-30 ENCOUNTER — Telehealth: Payer: Self-pay | Admitting: *Deleted

## 2022-01-30 NOTE — Telephone Encounter (Signed)
Have tried multiple time to reach Daniel Crawford to schedule consult for TIPS,  no answer and the voice mail is not setup./vm

## 2022-02-01 IMAGING — CT CT ABD-PELV W/ CM
2 of 5 series · 16 of 46 positions shown, 18 images · IV contrast (Omnipaque or Isovue)
Comparison: Ultrasound 03/22/2021, CT 03/23/2021

CLINICAL DATA: Right lower quadrant pain

EXAM:
CT ABDOMEN AND PELVIS WITH CONTRAST
TECHNIQUE: Multidetector CT imaging of the abdomen and pelvis was performed
using the standard protocol following bolus administration of
intravenous contrast.

[Series 2: axial st · axial · 0.98mm/px · z∈[+884,+1370]mm · 13 of 111 slices shown, 15 images]
[im 7/111  soft-tissue]
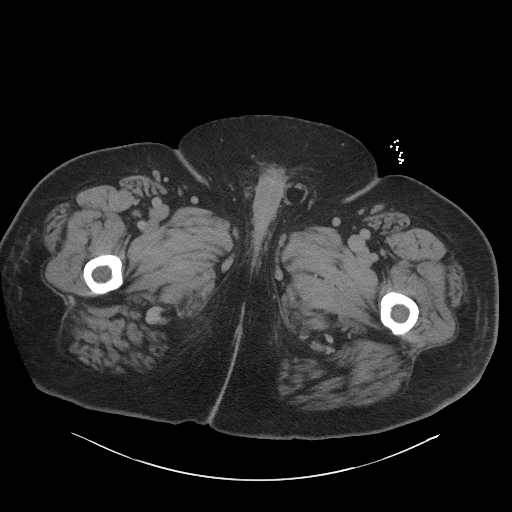
[im 7/111  bone]
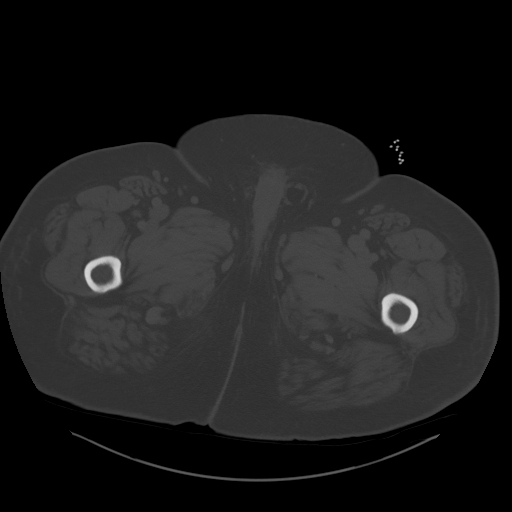
[im 13/111  soft-tissue]
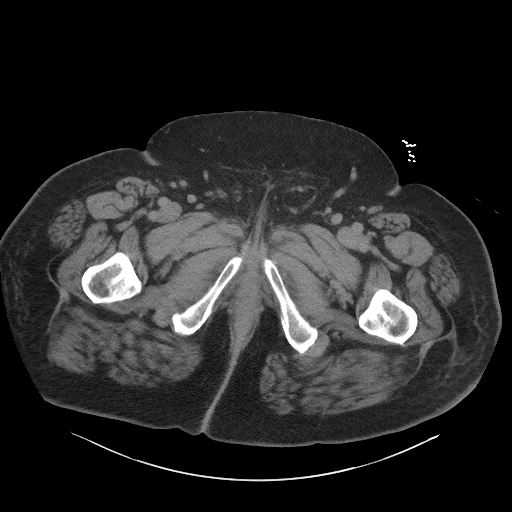
[im 26/111  soft-tissue]
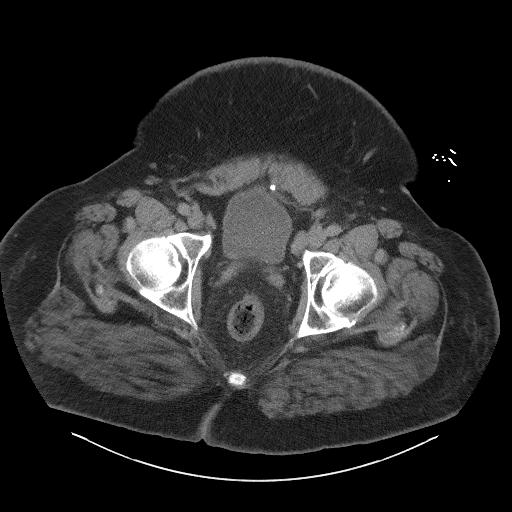
[im 33/111  soft-tissue]
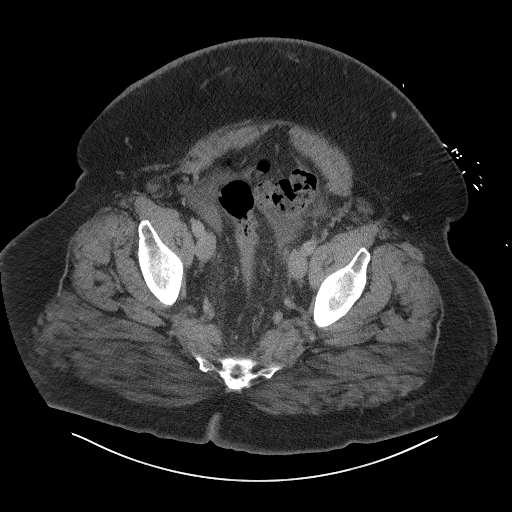
[im 39/111  soft-tissue]
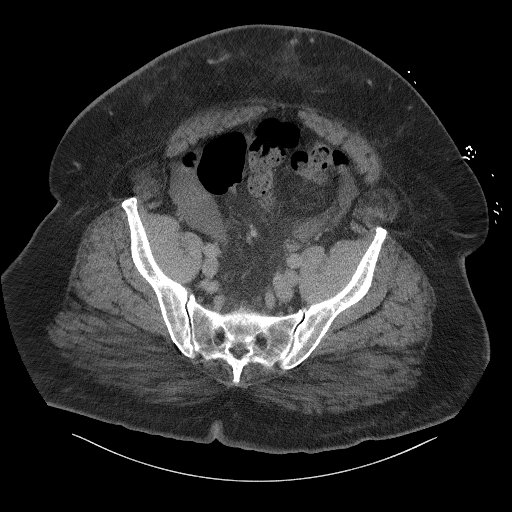
[im 46/111  soft-tissue]
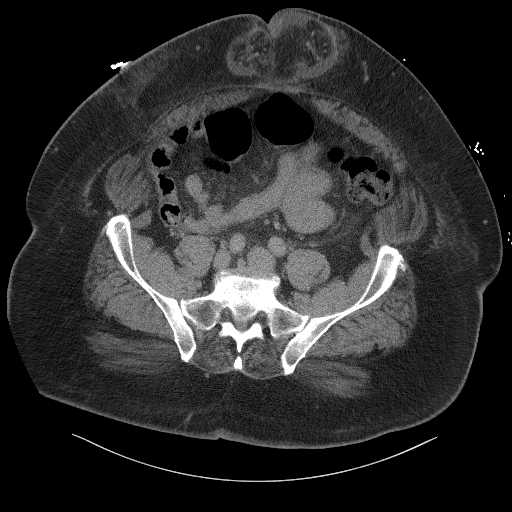
[im 59/111  soft-tissue]
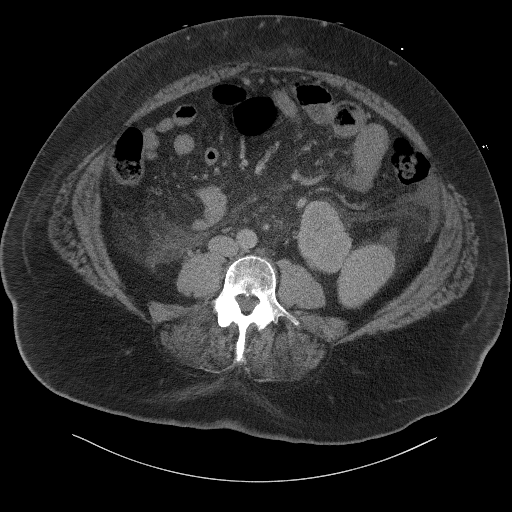
[im 65/111  soft-tissue]
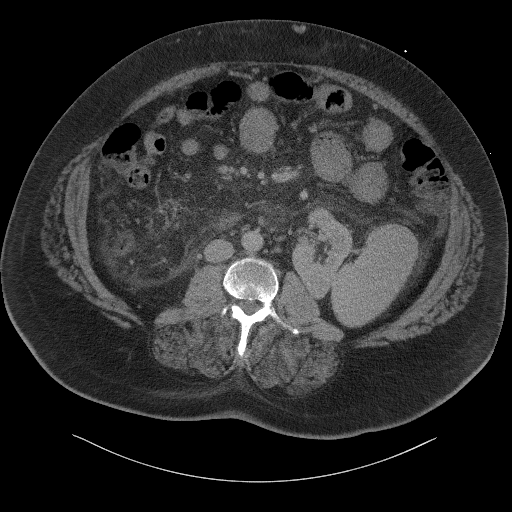
[im 72/111  soft-tissue]
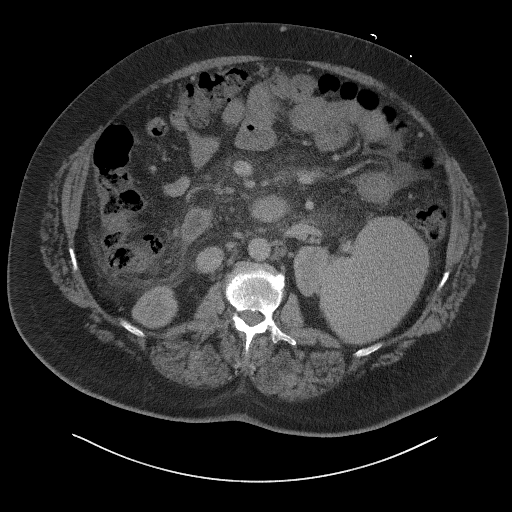
[im 72/111  bone]
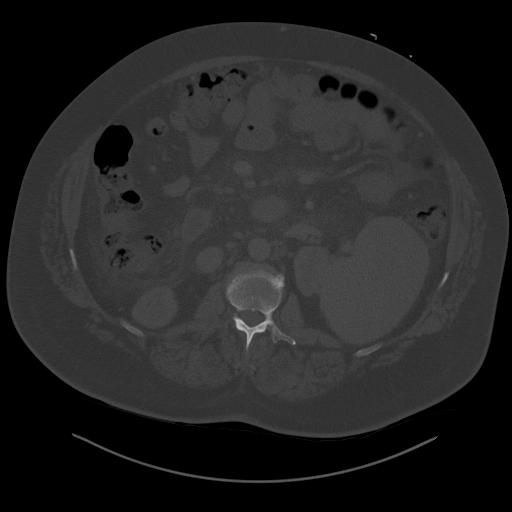
[im 78/111  soft-tissue]
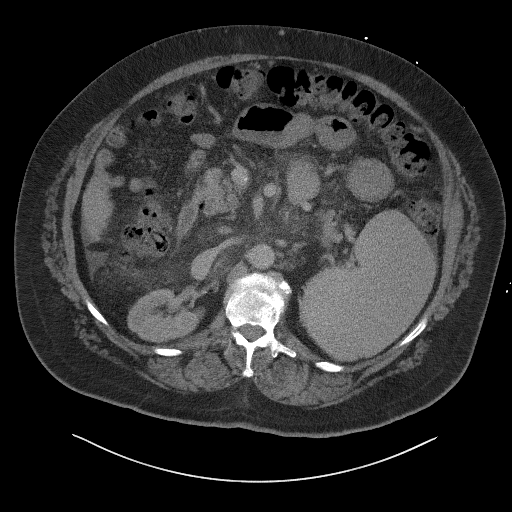
[im 85/111  soft-tissue]
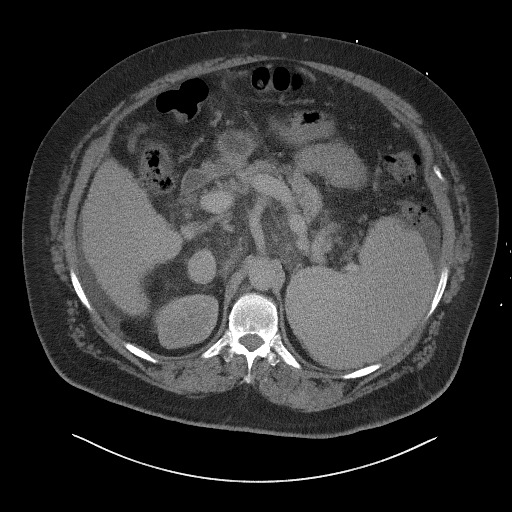
[im 98/111  soft-tissue]
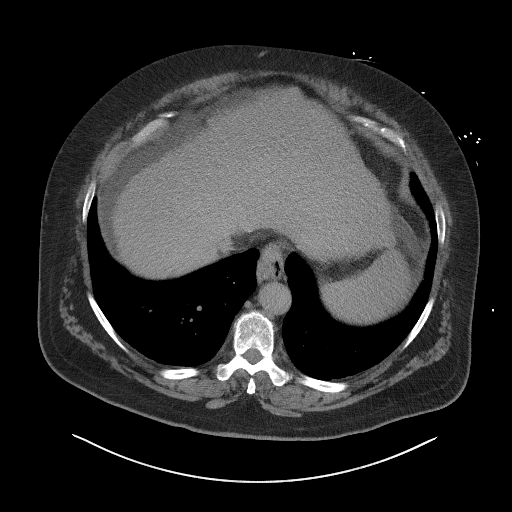
[im 104/111  soft-tissue]
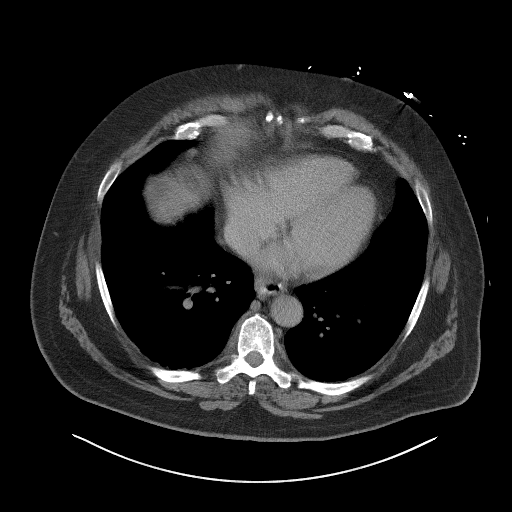

[Series 5: coronal st · coronal · 0.96mm/px · 3 of 146 slices shown]
[im 49/146  soft-tissue]
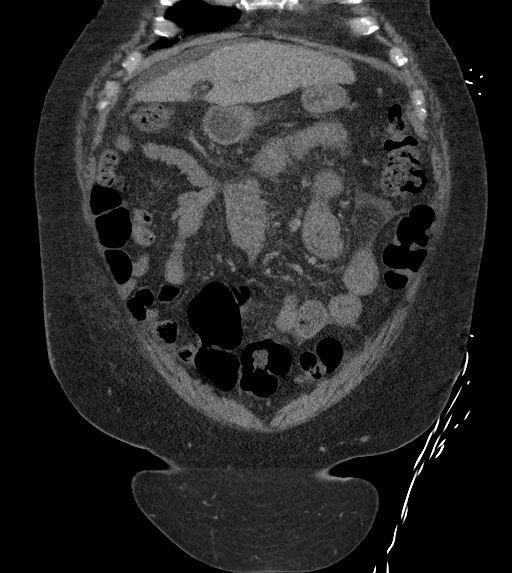
[im 65/146  soft-tissue]
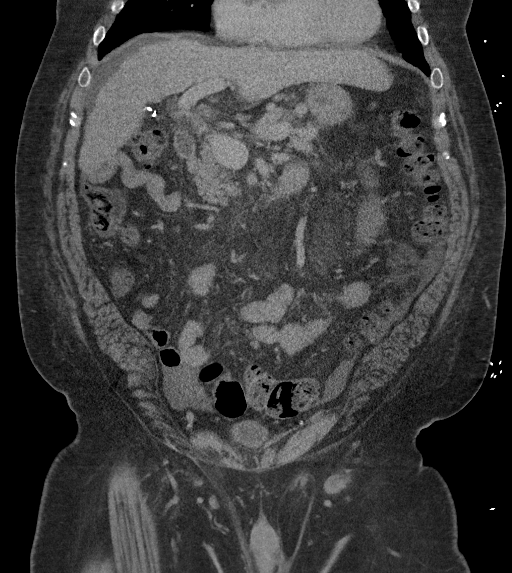
[im 81/146  soft-tissue]
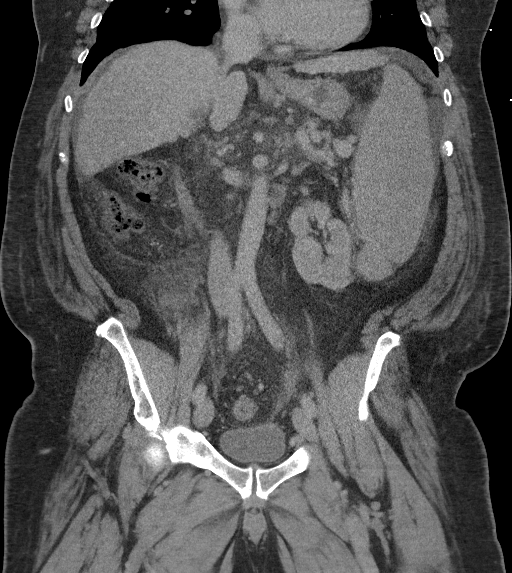

[16 of 46 positions shown; findings below may reference images not displayed]

RADIATION DOSE REDUCTION: This exam was performed according to the
departmental dose-optimization program which includes automated
exposure control, adjustment of the mA and/or kV according to
patient size and/or use of iterative reconstruction technique.

CONTRAST:  100mL OMNIPAQUE IOHEXOL 300 MG/ML  SOLN
FINDINGS: Lower chest: No acute consolidation or pleural effusion. Mild
cardiomegaly. Small gastroesophageal varices.

Hepatobiliary: Liver cirrhosis. Status post cholecystectomy. No
biliary dilatation

Pancreas: Unremarkable. No pancreatic ductal dilatation or
surrounding inflammatory changes.

Spleen: Enlarged measuring 22 cm craniocaudad.

Adrenals/Urinary Tract: Adrenal glands are unremarkable. Kidneys are
normal, without focal lesion, or hydronephrosis. Small
nonobstructing stone lower pole right kidney. Bladder is
unremarkable.

Stomach/Bowel: Stomach is within normal limits. Appendix appears
normal. No evidence of bowel wall thickening, distention, or
inflammatory changes.

Vascular/Lymphatic: Nonaneurysmal aorta. Nonspecific retroperitoneal
lymph nodes. Recanalized paraumbilical vein.

Reproductive: Prostate is unremarkable.

Other: No free air. Small amount of abdominopelvic ascites. Hazy
edema and stranding within the mesentery and colic gutters. Large
periumbilical hernia containing mesenteric fat. Small amount of
fluid in the hernia sac.

Musculoskeletal: No acute osseous abnormality.
IMPRESSION: 1. Negative for acute appendicitis.
2. Liver cirrhosis with portal hypertension. Splenomegaly and small
amount of abdominopelvic ascites. Small gastroesophageal varices.
3. Large periumbilical hernia containing mesenteric fat and small
amount of fluid
4. Nonobstructing right kidney stone

## 2022-02-19 ENCOUNTER — Ambulatory Visit
Admission: RE | Admit: 2022-02-19 | Discharge: 2022-02-19 | Disposition: A | Discharge status: Home / Self Care | Payer: Medicare HMO | Source: Ambulatory Visit | Attending: Gastroenterology | Admitting: Gastroenterology

## 2022-02-19 DIAGNOSIS — K7469 Other cirrhosis of liver: Secondary | ICD-10-CM

## 2022-02-19 DIAGNOSIS — K7581 Nonalcoholic steatohepatitis (NASH): Secondary | ICD-10-CM | POA: Diagnosis not present

## 2022-02-19 HISTORY — PX: IR RADIOLOGIST EVAL & MGMT: IMG5224

## 2022-02-19 NOTE — Consult Note (Signed)
Chief Complaint: Patient was seen in virtual telephone consultation today for consideration of TIPS  Referring Physician(s): Castaneda Mayorga,Daniel  History of Present Illness: Daniel Crawford is a 66 y.o. male with history of NASH cirrhosis, chronic kidney disease, and diastolic heart failure.  Due to diffuse body wall anasarca in the setting of cirrhosis, he is referred for consideration of TIPS creation.  He was recently hospitalized at [facility] from 10/30 until 11/2 for anasarca and shortness of breath.  He underwent right heart catheterization on 10/16 which showed mild pulmonary hypertension and mildly elevated right heart pressures.  Today he is very short of breath during conversation.  His main complaints are shortness of breath, difficulty with ambulation, and extreme exhaustion.  He attributes the exhaustion to the taking high dose diuretics (100 mg furosemide and 50 mg spironolactone daily) and constant voiding.  He states that the fluid that was building up when he was in the hospital is starting to return, worse on his left side.  He was first diagnosed with cirrhosis by biopsy [facility], [place]) in 2016.  He has never received a paracentesis or had sufficient ascites for such.  No hepatic hydrothorax.  He denies upper or lower GI bleeding.  He denies encephalopathy.  He was diagnosed with stage 3 CKD this past year.   Past Medical History:  Diagnosis Date   Anemia    Anxiety    Asthma    CHF (congestive heart failure) ([place])    CKD (chronic kidney disease)    Depression    Diabetes mellitus without complication ([place])    Dyspnea    Hypertension    Hypothyroidism    Liver cirrhosis secondary to NASH ([place])    NASH (nonalcoholic steatohepatitis)    Pre-diabetes    Spleen enlarged    Thrombocytopenia ([place])     Past Surgical History:  Procedure Laterality Date   Bilateral hernia surgery     2006, 2007   CHOLECYSTECTOMY     2016    COLONOSCOPY WITH PROPOFOL N/A 06/28/2021    Procedure: COLONOSCOPY WITH PROPOFOL;  Surgeon: Rogene Houston, MD;  [facility];  Service: Endoscopy;  Laterality: N/A;  1020   ESOPHAGEAL DILATION N/A 11/06/2019   Procedure: ESOPHAGEAL DILATION;  Surgeon: Harvel Quale, MD;  [facility];  Service: Gastroenterology;  Laterality: N/A;   ESOPHAGOGASTRODUODENOSCOPY (EGD) WITH PROPOFOL N/A 11/06/2019   Procedure: ESOPHAGOGASTRODUODENOSCOPY (EGD) WITH PROPOFOL;  Surgeon: Harvel Quale, MD;  [facility];  Service: Gastroenterology;  Laterality: N/A;  Lares removed     in January of 2018.    POLYPECTOMY  06/28/2021   Procedure: POLYPECTOMY INTESTINAL;  Surgeon: Rogene Houston, MD;  [facility];  Service: Endoscopy;;   RIGHT HEART CATH N/A 01/08/2022   Procedure: RIGHT HEART CATH;  Surgeon: Jettie Booze, MD;  Location: [place] [facility];  Service: Cardiovascular;  Laterality: N/A;    Allergies: Patient has no known allergies.  Medications: Prior to Admission medications   Medication Sig Start Date End Date Taking? Authorizing Provider  acetaminophen (TYLENOL) 500 MG tablet Take 1,000 mg by mouth every 6 (six) hours as needed for moderate pain.    [provider]  albuterol (VENTOLIN HFA) 108 (90 Base) MCG/ACT inhaler TAKE 2 PUFFS BY MOUTH EVERY 6 HOURS AS NEEDED FOR WHEEZE OR SHORTNESS OF BREATH Patient taking differently: Inhale 2 puffs into the  lungs every 6 (six) hours as needed for wheezing or shortness of breath. 11/02/21   Rigoberto Noel, MD  carvedilol (COREG) 6.25 MG tablet Take 1 tablet (6.25 mg total) by mouth 2 (two) times daily with a meal. 01/25/22   Emokpae, Courage, MD  furosemide (LASIX) 20 MG tablet Take 3 tablets (60 mg total) by mouth 2 (two) times daily. 01/25/22 01/25/23  Roxan Hockey, MD  gabapentin (NEURONTIN) 100 MG capsule Take 100 mg by mouth daily as needed (pain). 02/20/21    [provider]  hydrOXYzine (VISTARIL) 50 MG capsule Take 1 capsule (50 mg total) by mouth 3 (three) times daily as needed for anxiety or nausea. 01/25/22   Roxan Hockey, MD  levothyroxine (SYNTHROID) 175 MCG tablet Take 175 mcg by mouth daily before breakfast.    [provider]  ondansetron (ZOFRAN) 8 MG tablet Take 8 mg by mouth every 8 (eight) hours as needed for nausea or vomiting.    [provider]  pantoprazole (PROTONIX) 40 MG tablet Take 1 tablet (40 mg total) by mouth daily. 01/25/22   Roxan Hockey, MD  potassium chloride SA (KLOR-CON M) 20 MEQ tablet Take 1 tablet (20 mEq total) by mouth daily. 01/25/22   Roxan Hockey, MD  rifaximin (XIFAXAN) 550 MG TABS tablet Take 1 tablet (550 mg total) by mouth 2 (two) times daily. 01/25/22   Roxan Hockey, MD  sertraline (ZOLOFT) 50 MG tablet Take 1 tablet (50 mg total) by mouth at bedtime. 01/25/22   Roxan Hockey, MD  spironolactone (ALDACTONE) 50 MG tablet Take 1 tablet (50 mg total) by mouth daily. 01/26/22   Roxan Hockey, MD  traZODone (DESYREL) 50 MG tablet Take 1 tablet (50 mg total) by mouth at bedtime. 01/25/22   Roxan Hockey, MD     Family History  Problem Relation Age of Onset   Liver cancer Mother    Heart disease Mother    Aneurysm Sister     Social History   Socioeconomic History   Marital status: Single    Spouse name: Not on file   Number of children: Not on file   Years of education: Not on file   Highest education level: Not on file  Occupational History   Not on file  Tobacco Use   Smoking status: Former    Packs/day: 1.00    Years: 20.00    Total pack years: 20.00    Types: Cigarettes    Quit date: 2017    Years since quitting: 6.9   Smokeless tobacco: Never  Vaping Use   Vaping Use: Never used  Substance and Sexual Activity   Alcohol use: No   Drug use: No   Sexual activity: Not on file  Other Topics Concern   Not on file  Social History Narrative   Not  on file   Social Determinants of Health   Financial Resource Strain: Not on file  Food Insecurity: No Food Insecurity (01/23/2022)   Hunger Vital Sign    Worried About Running Out of Food in the Last Year: Never true    Ran Out of Food in the Last Year: Never true  Transportation Needs: No Transportation Needs (01/23/2022)   PRAPARE - Hydrologist (Medical): No    Lack of Transportation (Non-Medical): No  Physical Activity: Not on file  Stress: Not on file  Social Connections: Not on file    Review of Systems: A 12 point ROS discussed and pertinent positives are  indicated in the HPI above.  All other systems are negative.  Vital Signs: There were no vitals taken for this visit.  No physical examination was performed in lieu of virtual telephone clinic visit.  Imaging: [place] ASCITES (ABDOMEN LIMITED)  Result Date: 01/23/2022 CLINICAL DATA:  Decompensated hepatic cirrhosis EXAM: LIMITED ABDOMEN ULTRASOUND FOR ASCITES TECHNIQUE: Limited ultrasound survey for ascites was performed in all four abdominal quadrants. COMPARISON:  CT chest abdomen pelvis 01/22/2022 FINDINGS: Scattered small volume ascites, greatest amounts perihepatic and perisplenic. Observed volume of ascites is insufficient for expected therapeutic benefit from paracentesis. IMPRESSION: Small volume ascites., insufficient for paracentesis. Electronically Signed   By: Lavonia Dana M.D.   On: 01/23/2022 09:37   CT Angio Chest PE W and/or Wo Contrast  Result Date: 01/22/2022 CLINICAL DATA:  Increasing shortness of breath on exertion for 1 week, began after receiving COVID booster shot. Underwent CT without contrast earlier today for unintentional weight loss. EXAM: CT ANGIOGRAPHY CHEST WITH CONTRAST TECHNIQUE: Multidetector CT imaging of the chest was performed using the standard protocol during bolus administration of intravenous contrast. Multiplanar CT image reconstructions and MIPs were obtained  to evaluate the vascular anatomy. RADIATION DOSE REDUCTION: This exam was performed according to the departmental dose-optimization program which includes automated exposure control, adjustment of the mA and/or kV according to patient size and/or use of iterative reconstruction technique. CONTRAST:  13m OMNIPAQUE IOHEXOL 350 MG/ML SOLN COMPARISON:  Chest CT without contrast earlier today, CTA chest 03/23/2021 FINDINGS: Cardiovascular: The cardiac size is normal. There are no visible coronary artery calcifications no pericardial effusion. Pulmonary arteries are normal caliber and free of thrombus through the segmental divisions, but the subsegmental arterial bed is not evaluated due to both bolus timing and respiratory motion artifacts. There is mild calcific atherosclerosis in the distal aortic arch, mild descending segment tortuosity without further atherosclerosis. There is no stenosis or dissection in the aorta and great vessels. The aortic root and ascending segment are slightly dilated however with the aortic root measuring 4 cm at the sinuses of Valsalva and the ascending segment 4.3 cm in diameter. This was seen previously. Mediastinum/Nodes: Small hiatal hernia. There is retained versus refluxed fluid in the esophagus to above the level of the aortic arch. There is no esophageal masslike thickening. No thyroid mass is seen or intrathoracic or axillary adenopathy. The trachea is clear. Lungs/Pleura: Lungs are clear. No pleural effusion or pneumothorax. Upper Abdomen: Again noted findings of hepatic cirrhosis, splenomegaly, moderate ascites and a prominent main portal vein measuring 2 cm. No interval change is seen from earlier today. Musculoskeletal: No acute or significant osseous findings. Mild thoracic spondylosis. There is bilateral gynecomastia of the diffuse fibroglandular form. Review of the MIP images confirms the above findings. IMPRESSION: 1. No evidence of arterial dilatation or embolus through  the segmental divisions. The subsegmental arterial beds are not evaluated due to bolus timing and respiratory motion. 2. No acute chest CT or CTA findings. 3. Aortic root and ascending aortic dilatation, 4.3 cm in the ascending segment. No dissection or stenosis. Annual CTA or MRA follow-up recommended. 4. Small hiatal hernia with retained versus refluxed fluid in the esophagus to above the level of the aortic arch. Aspiration precautions suggested. 5. Hepatic cirrhosis with splenomegaly, moderate ascites and a prominent main portal vein. No interval change from earlier today. 6. Gynecomastia. Electronically Signed   By: KTelford NabM.D.   On: 01/22/2022 21:07   CT CHEST ABDOMEN PELVIS WO CONTRAST  Result Date: 01/22/2022 CLINICAL  DATA:  An intentional weight loss. EXAM: CT CHEST, ABDOMEN AND PELVIS WITHOUT CONTRAST TECHNIQUE: Multidetector CT imaging of the chest, abdomen and pelvis was performed following the standard protocol without IV contrast. RADIATION DOSE REDUCTION: This exam was performed according to the departmental dose-optimization program which includes automated exposure control, adjustment of the mA and/or kV according to patient size and/or use of iterative reconstruction technique. COMPARISON:  CT abdomen pelvis dated 09/20/2021. FINDINGS: Evaluation of this exam is limited in the absence of intravenous contrast. CT CHEST FINDINGS Cardiovascular: There is no cardiomegaly or pericardial effusion. The thoracic aorta and the central pulmonary arteries are grossly unremarkable on this noncontrast CT. Mediastinum/Nodes: No hilar or mediastinal adenopathy. The esophagus is grossly unremarkable. No mediastinal fluid collection. Lungs/Pleura: The lungs are clear. There is no pleural effusion or pneumothorax. The central airways are patent. Musculoskeletal: No acute osseous pathology. Bilateral gynecomastia. CT ABDOMEN PELVIS FINDINGS No intra-abdominal free air.  Small ascites. Hepatobiliary:  Cirrhosis.  Cholecystectomy. Pancreas: The pancreas is grossly unremarkable. Spleen: Splenomegaly measuring 23 cm in craniocaudal length. Adrenals/Urinary Tract: The adrenal glands unremarkable. There is a 3 mm nonobstructing right renal inferior pole calculus. No hydronephrosis. There is no hydronephrosis or nephrolithiasis on the left. The urinary bladder is collapsed. Stomach/Bowel: Diffuse thickened appearance of the colon likely related to hepatic colopathy. Colitis is less likely. Clinical correlation is recommended. There is no bowel obstruction. The appendix is not identified with certainty. Vascular/Lymphatic: The abdominal aorta and IVC are grossly unremarkable on this noncontrast CT. No portal venous gas. There is no adenopathy. Reproductive: The prostate and seminal vesicles are grossly unremarkable. Other: Small fat containing left inguinal hernia. Left inguinal hernia repair mesh. There is a fat containing umbilical hernia. The neck of the hernia defect measures 4.5 cm in greatest axial diameter. There is diffuse subcutaneous edema. Musculoskeletal: Osteopenia with degenerative changes of the spine. No acute osseous pathology. IMPRESSION: 1. No acute intrathoracic pathology. 2. Cirrhosis with splenomegaly and small ascites. 3. Diffuse thickened appearance of the colon likely related to hepatic colopathy. Colitis is less likely. No bowel obstruction. 4. A 3 mm nonobstructing right renal inferior pole calculus. No hydronephrosis. Electronically Signed   By: Anner Crete M.D.   On: 01/22/2022 19:35   DG Chest 2 View  Result Date: 01/22/2022 CLINICAL DATA:  Shortness of breath. Body aches and chills. Dehydration. EXAM: CHEST - 2 VIEW COMPARISON:  Chest two views 11/30/2021 FINDINGS: Cardiac silhouette and mediastinal contours are within normal limits. The lungs are clear. No pleural effusion or pneumothorax. Minimal multilevel degenerative disc changes of the thoracic spine. IMPRESSION: No active  cardiopulmonary disease. Electronically Signed   By: Yvonne Kendall M.D.   On: 01/22/2022 17:32    01/08/22 Right heart catheterization:   Labs:  CBC: Recent Labs    12/27/21 1213 01/08/22 1141 01/08/22 1144 01/22/22 1755 01/23/22 0052 01/24/22 0306  WBC 2.7*  --   --  4.3 4.5 3.3*  HGB 12.3*   < > 11.9* 11.6* 11.6* 10.1*  HCT 36.5*   < > 35.0* 35.3* 35.4* 29.8*  PLT 63*  --   --  95* 95* 83*   < > = values in this interval not displayed.    COAGS: Recent Labs    06/26/21 1321 08/15/21 1354 01/23/22 0537 01/24/22 1159  INR 1.3* 1.2* 1.4* 1.4*    BMP: Recent Labs    01/22/22 1755 01/23/22 0052 01/24/22 0306 01/25/22 0430  NA 136 138 136 138  K 3.6 4.1  2.7* 2.9*  CL 103 107 100 99  CO2 25 21* 28 31  GLUCOSE 172* 115* 114* 134*  BUN '10 10 8 9  '$ CALCIUM 8.6* 8.6* 7.8* 8.2*  CREATININE 1.15 1.14 1.22 1.29*  GFRNONAA >60 >60 >60 >60    LIVER FUNCTION TESTS: Recent Labs    01/22/22 1755 01/23/22 0052 01/24/22 0306 01/25/22 0430  BILITOT 3.5* 3.2* 3.3* 3.4*  AST 40 40 38 45*  ALT '16 17 15 18  '$ ALKPHOS 80 77 68 69  PROT 6.8 6.8 6.2* 6.2*  ALBUMIN 3.0* 3.0* 2.7* 2.8*    TUMOR MARKERS: Recent Labs    08/15/21 1354  AFPTM 9.5*   01/08/22-01/25/22 Child-Pugh = 8 points, class B MELD = 17 (6.0% estimated 65-monthmortality) [place] Index of Post-TIPS Survival (FIPS) = 0.96 (overall survival at 1 month 88.6%, 3 months 66.2%, and 6 months 53.6%)  Assessment and Plan: 66year old male with history of NASH cirrhosis (Child Pugh B, MELD 17), diastolic heart failure/pulmonary hypertension, and stage 3 CKD with recurrent anasarca on maximum diuretic therapy.  He has never has significant ascites, hepatic hydrothorax, or hemorrhagic varices.    At this time, there is no indication for TIPS creation.  Given cardiopulmonary history, TIPS Gravely cause worsening pulmonary hypertension.  I suspect recurrent anasarca is multifactorial from cardiac>renal>hepatic  etiologies.    If cardiac function were to improve significantly and he were to develop recurrent ascites, TIPS Einstein be considered.  Plan for 3 month follow up with repeat MELD labs (CBC, CMP, INR).     Electronically Signed: DSuzette Battiest MD 02/19/2022, 2:26 PM   I spent a total of  40 Minutes  in virtual telephone clinical consultation, greater than 50% of which was counseling/coordinating care for NASH cirrhosis.

## 2022-02-27 ENCOUNTER — Other Ambulatory Visit: Payer: Self-pay

## 2022-02-27 ENCOUNTER — Encounter (HOSPITAL_COMMUNITY): Payer: Self-pay | Admitting: Emergency Medicine

## 2022-02-27 ENCOUNTER — Other Ambulatory Visit: Payer: Self-pay | Admitting: Gastroenterology

## 2022-02-27 ENCOUNTER — Inpatient Hospital Stay (HOSPITAL_COMMUNITY)
Admission: EM | Admit: 2022-02-27 | Discharge: 2022-03-04 | DRG: 433 | Disposition: A | Discharge status: Home / Self Care | Payer: Medicare HMO | Attending: Family Medicine | Admitting: Family Medicine

## 2022-02-27 ENCOUNTER — Emergency Department (HOSPITAL_COMMUNITY): Payer: Medicare HMO

## 2022-02-27 DIAGNOSIS — R0602 Shortness of breath: Secondary | ICD-10-CM | POA: Diagnosis not present

## 2022-02-27 DIAGNOSIS — L299 Pruritus, unspecified: Secondary | ICD-10-CM | POA: Diagnosis not present

## 2022-02-27 DIAGNOSIS — D61818 Other pancytopenia: Secondary | ICD-10-CM | POA: Diagnosis present

## 2022-02-27 DIAGNOSIS — E440 Moderate protein-calorie malnutrition: Secondary | ICD-10-CM | POA: Diagnosis not present

## 2022-02-27 DIAGNOSIS — R188 Other ascites: Secondary | ICD-10-CM | POA: Diagnosis not present

## 2022-02-27 DIAGNOSIS — Z800 Family history of malignant neoplasm of digestive organs: Secondary | ICD-10-CM

## 2022-02-27 DIAGNOSIS — I10 Essential (primary) hypertension: Secondary | ICD-10-CM | POA: Diagnosis present

## 2022-02-27 DIAGNOSIS — K7460 Unspecified cirrhosis of liver: Secondary | ICD-10-CM

## 2022-02-27 DIAGNOSIS — E119 Type 2 diabetes mellitus without complications: Secondary | ICD-10-CM | POA: Diagnosis not present

## 2022-02-27 DIAGNOSIS — E8809 Other disorders of plasma-protein metabolism, not elsewhere classified: Secondary | ICD-10-CM | POA: Diagnosis not present

## 2022-02-27 DIAGNOSIS — Z8249 Family history of ischemic heart disease and other diseases of the circulatory system: Secondary | ICD-10-CM

## 2022-02-27 DIAGNOSIS — E8770 Fluid overload, unspecified: Secondary | ICD-10-CM | POA: Diagnosis not present

## 2022-02-27 DIAGNOSIS — T502X5A Adverse effect of carbonic-anhydrase inhibitors, benzothiadiazides and other diuretics, initial encounter: Secondary | ICD-10-CM | POA: Diagnosis not present

## 2022-02-27 DIAGNOSIS — F32A Depression, unspecified: Secondary | ICD-10-CM | POA: Diagnosis not present

## 2022-02-27 DIAGNOSIS — D509 Iron deficiency anemia, unspecified: Secondary | ICD-10-CM | POA: Diagnosis present

## 2022-02-27 DIAGNOSIS — K7581 Nonalcoholic steatohepatitis (NASH): Secondary | ICD-10-CM | POA: Diagnosis present

## 2022-02-27 DIAGNOSIS — N179 Acute kidney failure, unspecified: Secondary | ICD-10-CM | POA: Diagnosis not present

## 2022-02-27 DIAGNOSIS — Z79891 Long term (current) use of opiate analgesic: Secondary | ICD-10-CM

## 2022-02-27 DIAGNOSIS — E46 Unspecified protein-calorie malnutrition: Secondary | ICD-10-CM | POA: Diagnosis not present

## 2022-02-27 DIAGNOSIS — R1084 Generalized abdominal pain: Secondary | ICD-10-CM

## 2022-02-27 DIAGNOSIS — F419 Anxiety disorder, unspecified: Secondary | ICD-10-CM | POA: Diagnosis present

## 2022-02-27 DIAGNOSIS — E039 Hypothyroidism, unspecified: Secondary | ICD-10-CM | POA: Diagnosis present

## 2022-02-27 DIAGNOSIS — N189 Chronic kidney disease, unspecified: Secondary | ICD-10-CM | POA: Diagnosis not present

## 2022-02-27 DIAGNOSIS — Z1152 Encounter for screening for COVID-19: Secondary | ICD-10-CM | POA: Diagnosis not present

## 2022-02-27 DIAGNOSIS — Z79890 Hormone replacement therapy (postmenopausal): Secondary | ICD-10-CM | POA: Diagnosis not present

## 2022-02-27 DIAGNOSIS — R109 Unspecified abdominal pain: Secondary | ICD-10-CM | POA: Insufficient documentation

## 2022-02-27 DIAGNOSIS — K219 Gastro-esophageal reflux disease without esophagitis: Secondary | ICD-10-CM | POA: Diagnosis present

## 2022-02-27 DIAGNOSIS — R69 Illness, unspecified: Secondary | ICD-10-CM | POA: Diagnosis not present

## 2022-02-27 DIAGNOSIS — Z9049 Acquired absence of other specified parts of digestive tract: Secondary | ICD-10-CM

## 2022-02-27 DIAGNOSIS — Z87891 Personal history of nicotine dependence: Secondary | ICD-10-CM

## 2022-02-27 DIAGNOSIS — Z79899 Other long term (current) drug therapy: Secondary | ICD-10-CM

## 2022-02-27 DIAGNOSIS — D696 Thrombocytopenia, unspecified: Secondary | ICD-10-CM | POA: Diagnosis present

## 2022-02-27 DIAGNOSIS — I272 Pulmonary hypertension, unspecified: Secondary | ICD-10-CM | POA: Diagnosis not present

## 2022-02-27 DIAGNOSIS — R601 Generalized edema: Secondary | ICD-10-CM | POA: Diagnosis present

## 2022-02-27 DIAGNOSIS — F418 Other specified anxiety disorders: Secondary | ICD-10-CM | POA: Diagnosis present

## 2022-02-27 DIAGNOSIS — Z6841 Body Mass Index (BMI) 40.0 and over, adult: Secondary | ICD-10-CM

## 2022-02-27 DIAGNOSIS — R140 Abdominal distension (gaseous): Secondary | ICD-10-CM | POA: Diagnosis not present

## 2022-02-27 LAB — RESP PANEL BY RT-PCR (FLU A&B, COVID) ARPGX2
Influenza A by PCR: NEGATIVE
Influenza B by PCR: NEGATIVE
SARS Coronavirus 2 by RT PCR: NEGATIVE

## 2022-02-27 LAB — COMPREHENSIVE METABOLIC PANEL
ALT: 20 U/L (ref 0–44)
AST: 53 U/L — ABNORMAL HIGH (ref 15–41)
Albumin: 2.9 g/dL — ABNORMAL LOW (ref 3.5–5.0)
Alkaline Phosphatase: 96 U/L (ref 38–126)
Anion gap: 9 (ref 5–15)
BUN: 13 mg/dL (ref 8–23)
CO2: 25 mmol/L (ref 22–32)
Calcium: 8.6 mg/dL — ABNORMAL LOW (ref 8.9–10.3)
Chloride: 101 mmol/L (ref 98–111)
Creatinine, Ser: 1.3 mg/dL — ABNORMAL HIGH (ref 0.61–1.24)
GFR, Estimated: >60 mL/min (ref >60)
Glucose, Bld: 168 mg/dL — ABNORMAL HIGH (ref 70–99)
Potassium: 4.2 mmol/L (ref 3.5–5.1)
Sodium: 135 mmol/L (ref 135–145)
Total Bilirubin: 3.7 mg/dL — ABNORMAL HIGH (ref 0.3–1.2)
Total Protein: 6.6 g/dL (ref 6.5–8.1)

## 2022-02-27 LAB — CBC
HCT: 36 % — ABNORMAL LOW (ref 39.0–52.0)
Hemoglobin: 11.6 g/dL — ABNORMAL LOW (ref 13.0–17.0)
MCH: 28.9 pg (ref 26.0–34.0)
MCHC: 32.2 g/dL (ref 30.0–36.0)
MCV: 89.8 fL (ref 80.0–100.0)
Platelets: 68 10*3/uL — ABNORMAL LOW (ref 150–400)
RBC: 4.01 MIL/uL — ABNORMAL LOW (ref 4.22–5.81)
RDW: 17.8 % — ABNORMAL HIGH (ref 11.5–15.5)
WBC: 3.6 10*3/uL — ABNORMAL LOW (ref 4.0–10.5)
nRBC: 0 % (ref 0.0–0.2)

## 2022-02-27 LAB — AMMONIA: Ammonia: 34 umol/L (ref 9–35)

## 2022-02-27 LAB — BRAIN NATRIURETIC PEPTIDE: B Natriuretic Peptide: 34 pg/mL (ref 0.0–100.0)

## 2022-02-27 MED ORDER — FUROSEMIDE 10 MG/ML IJ SOLN
40.0000 mg | Freq: Once | INTRAMUSCULAR | Status: AC
  Administered 2022-02-27: 40 mg via INTRAVENOUS
  Filled 2022-02-27: qty 4

## 2022-02-27 NOTE — ED Provider Notes (Signed)
[facility] Provider Note   CSN: [id number] Arrival date & time: 02/27/22  1812     History  Chief Complaint  Patient presents with   Shortness of Breath    Daniel Crawford is a 66 y.o. male.  The history is provided by the patient and medical records. No language interpreter was used.  Shortness of Breath    66 year old male significant history of liver cirrhosis secondary to [facility], CKD, diabetes, hypertension, CHF, hypothyroidism presents to the ED with complaints of shortness of breath.  Patient reports for the past 2 weeks he has had progressive worsening shortness of breath and fluid retention.  He attributed to his liver cirrhosis secondary to [place] as well as CHF.  He used to weigh himself but he does not want to weigh recently as he is frustrated as his condition.  Shortness of breath worse with exertion.  No fever chills no productive cough.  He is unable to tolerate his TIPS procedure.  States that he was admitted previously for same and symptoms were improved with treatment in the hospital.  Home Medications Prior to Admission medications   Medication Sig Start Date End Date Taking? Authorizing Provider  acetaminophen (TYLENOL) 500 MG tablet Take 1,000 mg by mouth every 6 (six) hours as needed for moderate pain.    [provider]  albuterol (VENTOLIN HFA) 108 (90 Base) MCG/ACT inhaler TAKE 2 PUFFS BY MOUTH EVERY 6 HOURS AS NEEDED FOR WHEEZE OR SHORTNESS OF BREATH Patient taking differently: Inhale 2 puffs into the lungs every 6 (six) hours as needed for wheezing or shortness of breath. 11/02/21   Rigoberto Noel, MD  carvedilol (COREG) 6.25 MG tablet Take 1 tablet (6.25 mg total) by mouth 2 (two) times daily with a meal. 01/25/22   Emokpae, Courage, MD  furosemide (LASIX) 20 MG tablet Take 3 tablets (60 mg total) by mouth 2 (two) times daily. 01/25/22 01/25/23  Roxan Hockey, MD  gabapentin (NEURONTIN) 100 MG capsule Take 100 mg by mouth daily as needed  (pain). 02/20/21   [provider]  hydrOXYzine (VISTARIL) 50 MG capsule Take 1 capsule (50 mg total) by mouth 3 (three) times daily as needed for anxiety or nausea. 01/25/22   Roxan Hockey, MD  levothyroxine (SYNTHROID) 175 MCG tablet Take 175 mcg by mouth daily before breakfast.    [provider]  ondansetron (ZOFRAN) 8 MG tablet Take 8 mg by mouth every 8 (eight) hours as needed for nausea or vomiting.    [provider]  pantoprazole (PROTONIX) 40 MG tablet Take 1 tablet (40 mg total) by mouth daily. 01/25/22   Roxan Hockey, MD  potassium chloride SA (KLOR-CON M) 20 MEQ tablet Take 1 tablet (20 mEq total) by mouth daily. 01/25/22   Roxan Hockey, MD  rifaximin (XIFAXAN) 550 MG TABS tablet Take 1 tablet (550 mg total) by mouth 2 (two) times daily. 01/25/22   Roxan Hockey, MD  sertraline (ZOLOFT) 50 MG tablet Take 1 tablet (50 mg total) by mouth at bedtime. 01/25/22   Roxan Hockey, MD  spironolactone (ALDACTONE) 50 MG tablet Take 1 tablet (50 mg total) by mouth daily. 01/26/22   Roxan Hockey, MD  traZODone (DESYREL) 50 MG tablet Take 1 tablet (50 mg total) by mouth at bedtime. 01/25/22   Roxan Hockey, MD      Allergies    Patient has no known allergies.    Review of Systems   Review of Systems  Respiratory:  Positive for shortness  of breath.   All other systems reviewed and are negative.   Physical Exam Updated Vital Signs BP (!) 187/88 (BP Location: Right Wrist)   Pulse 94   Temp 97.9 F (36.6 C) (Oral)   Resp (!) 23   SpO2 95%  Physical Exam Vitals and nursing note reviewed.  Constitutional:      General: He is not in acute distress.    Appearance: He is well-developed. He is obese.  HENT:     Head: Atraumatic.  Eyes:     Conjunctiva/sclera: Conjunctivae normal.  Cardiovascular:     Rate and Rhythm: Normal rate and regular rhythm.  Pulmonary:     Breath sounds: Decreased breath sounds present. No wheezing, rhonchi or rales.      Comments: Mildly tachypneic Abdominal:     Comments: Distended abdomen nontender to palpation.  Reproducible umbilical hernia noted.  Musculoskeletal:     Cervical back: Neck supple.     Right lower leg: Edema present.     Left lower leg: Edema present.  Skin:    Findings: No rash.  Neurological:     Mental Status: He is alert.     ED Results / Procedures / Treatments   Labs (all labs ordered are listed, but only abnormal results are displayed) Labs Reviewed  CBC - Abnormal; Notable for the following components:      Result Value   WBC 3.6 (*)    RBC 4.01 (*)    Hemoglobin 11.6 (*)    HCT 36.0 (*)    RDW 17.8 (*)    Platelets 68 (*)    All other components within normal limits  COMPREHENSIVE METABOLIC PANEL - Abnormal; Notable for the following components:   Glucose, Bld 168 (*)    Creatinine, Ser 1.30 (*)    Calcium 8.6 (*)    Albumin 2.9 (*)    AST 53 (*)    Total Bilirubin 3.7 (*)    All other components within normal limits  RESP PANEL BY RT-PCR (FLU A&B, COVID) ARPGX2  BRAIN NATRIURETIC PEPTIDE  AMMONIA  URINALYSIS, ROUTINE W REFLEX MICROSCOPIC  CBG MONITORING, ED    EKG EKG Interpretation  Date/Time:  Tuesday February 27 2022 18:30:59 EST Ventricular Rate:  93 PR Interval:  198 QRS Duration: 94 QT Interval:  391 QTC Calculation: 487 R Axis:   25 Text Interpretation: Sinus rhythm Low voltage, precordial leads Borderline prolonged QT interval since last tracing no significant change Confirmed by Noemi Chapel [phone]) on 02/27/2022 6:38:36 PM  Radiology DG Chest Port 1 View  Result Date: 02/27/2022 CLINICAL DATA:  Shortness of breath.  Abdominal distension. EXAM: PORTABLE CHEST 1 VIEW COMPARISON:  01/22/2022 FINDINGS: Heart size and mediastinal contours are unremarkable. Lungs are hypoinflated. There is no pleural fluid or interstitial edema. No airspace disease. Visualized osseous structures appear intact. IMPRESSION: Low lung volumes. No acute  findings. Electronically Signed   By: Kerby Moors M.D.   On: 02/27/2022 18:51    Procedures Procedures    Medications Ordered in ED Medications  furosemide (LASIX) injection 40 mg (40 mg Intravenous Given 02/27/22 1845)    ED Course/ Medical Decision Making/ A&P                           Medical Decision Making Amount and/or Complexity of Data Reviewed Labs: ordered. Radiology: ordered.  Risk Prescription drug management. Decision regarding hospitalization.   BP (!) 187/88 (BP Location: Right Wrist)  Pulse 94   Temp 97.9 F (36.6 C) (Oral)   Resp (!) 23   SpO2 95%   56:5 PM  66 year old male significant history of liver cirrhosis secondary to [place], CKD, diabetes, hypertension, CHF, hypothyroidism presents to the ED with complaints of shortness of breath.  Patient reports for the past 2 weeks he has had progressive worsening shortness of breath and fluid retention.  He attributed to his liver cirrhosis secondary to [place] as well as CHF.  He used to weigh himself but he does not want to weigh recently as he is frustrated as his condition.  Shortness of breath worse with exertion.  No fever chills no productive cough.  He is unable to tolerate his TIPS procedure.  States that he was admitted previously for same and symptoms were improved with treatment in the hospital.  On exam this is an obese elderly male with obvious anasarca as evidenced by a protuberant abdomen, and 3+ pitting mid to bilateral extremities.  He does not have any JVD and on lung exam I do not appreciate any significant crackles however his breathing is shallow and difficult to fully appreciate his respiratory exam.  His abdomen is nontender low suspicion for SBO.  Vital signs remarkable for elevated blood pressure of 187/88.  He is afebrile, he is mildly tachypneic with a respiratory of 23, O2 sat is 95% on room air.  Patient symptoms concerning for fluid retention in the setting of liver cirrhosis secondary to  [place] as well as CHF.  Patient would likely benefit from IV diuresis for symptom control as patient cannot tolerate TIPS due to underlying CHF.  Chart reviewed I noted that patient was admitted to the hospital last month for similar presentation..  Care discussed with Dr. Sabra Heck.   This patient presents to the ED for concern of SOB, this involves an extensive number of treatment options, and is a complaint that carries with it a high risk of complications and morbidity.  The differential diagnosis includes anasarca, CHF exacerbation, abdominal ascites, copd, pe, pna, ptx, acs  Co morbidities that complicate the patient evaluation NASH  CHF Additional history obtained:  Additional history obtained from patient External records from outside source obtained and reviewed including EMR including prior labs and imaging  Lab Tests:  I Ordered, and personally interpreted labs.  The pertinent results include:  Cr. 1.3.  CBG 168, albumin 2.9  Imaging Studies ordered:  I ordered imaging studies including CXR I independently visualized and interpreted imaging which showed no acute finding I agree with the radiologist interpretation  Cardiac Monitoring:  The patient was maintained on a cardiac monitor.  I personally viewed and interpreted the cardiac monitored which showed an underlying rhythm of: NSR  Medicines ordered and prescription drug management:  I ordered medication including lasix  for fluid retention Reevaluation of the patient after these medicines showed that the patient stayed the same I have reviewed the patients home medicines and have made adjustments as needed  Test Considered: as above  Critical Interventions: IV lasix  Consultations Obtained:  I requested consultation with the hospitalist DR. Adefeso,  and discussed lab and imaging findings as well as pertinent plan - they recommend: admission  Problem List / ED Course: anasarca  Reevaluation:  After the  interventions noted above, I reevaluated the patient and found that they have :improved  Social Determinants of Health: depression  Dispostion:  After consideration of the diagnostic results and the patients response to treatment, I feel that the  patent would benefit from admission.         Final Clinical Impression(s) / ED Diagnoses Final diagnoses:  Anasarca    Rx / DC Orders ED Discharge Orders     None         Domenic Moras, PA-C 02/27/22 2022    Noemi Chapel, MD 02/27/22 2023

## 2022-02-27 NOTE — H&P (Signed)
History and Physical    Patient: Roxanne Orner OAC:[id number] DOB: [birth year] DOA: 02/27/2022 DOS: the patient was seen and examined on 02/28/2022 PCP: [name], MD  Patient coming from: Home  Chief Complaint:  Chief Complaint  Patient presents with   Shortness of Breath   HPI: Jackob Schul is a 66 y.o. male with medical history significant of type 2 diabetes mellitus, hypertension, hypothyroidism, CHF, cirrhosis due to NASH and thrombocytopenia who presents to the emergency department due to increased leg swelling and abdominal distention which worsened within the last 3 days.  He complained of leg tightening due to increased fluid in legs, he also complained of abdomen becoming harder due to fluid accumulation, this is associated with some shortness of breath on mild exertion. He was recently admitted from 10/30 to 11/2 due to NASH/cirrhosis decompensated with anasarca in the setting of CHF and chronic kidney disease.  He was treated with IV Lasix and oral Aldactone and outpatient IR consult for possible TIPS.  ED Course:  In the emergency department, BP was 150/97 and other vital signs were within normal range.  Workup in the ED showed pancytopenia (WBC 3.6, hemoglobin 11.6, hematocrit 36.0, platelets 68).  Ammonia 34, BNP 34.  Influenza A, B, SARS coronavirus 2 was negative. Chest x-ray showed low lung volumes.  No acute findings IV Lasix 40 mg x 1 was given.  Hospitalist was asked to admit patient for further evaluation and management.  Review of Systems: Review of systems as noted in the HPI. All other systems reviewed and are negative.   Past Medical History:  Diagnosis Date   Anemia    Anxiety    Asthma    CHF (congestive heart failure) ([place])    CKD (chronic kidney disease)    Depression    Diabetes mellitus without complication ([place])    Dyspnea    Hypertension    Hypothyroidism    Liver cirrhosis secondary to NASH ([place])    NASH (nonalcoholic steatohepatitis)     Pre-diabetes    Spleen enlarged    Thrombocytopenia ([place])    Past Surgical History:  Procedure Laterality Date   Bilateral hernia surgery     2006, 2007   CHOLECYSTECTOMY     2016    COLONOSCOPY WITH PROPOFOL N/A 06/28/2021   Procedure: COLONOSCOPY WITH PROPOFOL;  Surgeon: Rogene Houston, MD;  [facility];  Service: Endoscopy;  Laterality: N/A;  1020   ESOPHAGEAL DILATION N/A 11/06/2019   Procedure: ESOPHAGEAL DILATION;  Surgeon: Harvel Quale, MD;  Location: [facility];  Service: Gastroenterology;  Laterality: N/A;   ESOPHAGOGASTRODUODENOSCOPY (EGD) WITH PROPOFOL N/A 11/06/2019   Procedure: ESOPHAGOGASTRODUODENOSCOPY (EGD) WITH PROPOFOL;  Surgeon: Harvel Quale, MD;  [facility];  Service: Gastroenterology;  Laterality: N/A;  1045   IR RADIOLOGIST EVAL & MGMT  02/19/2022   LAPAROSCOPIC ASSISTED VENTRAL HERNIA REPAIR     Polyp removed     in January of 2018.    POLYPECTOMY  06/28/2021   Procedure: POLYPECTOMY INTESTINAL;  Surgeon: Rogene Houston, MD;  [facility];  Service: Endoscopy;;   RIGHT HEART CATH N/A 01/08/2022   Procedure: RIGHT HEART CATH;  Surgeon: Jettie Booze, MD;  Location: [facility];  Service: Cardiovascular;  Laterality: N/A;    Social History:  reports that he quit smoking about 6 years ago. His smoking use included cigarettes. He has a 20.00 pack-year smoking history. He has never used smokeless tobacco. He reports  that he does not drink alcohol and does not use drugs.   No Known Allergies  Family History  Problem Relation Age of Onset   Liver cancer Mother    Heart disease Mother    Aneurysm Sister      Prior to Admission medications   Medication Sig Start Date End Date Taking? Authorizing Provider  acetaminophen (TYLENOL) 500 MG tablet Take 1,000 mg by mouth every 6 (six) hours as needed for moderate pain.   Yes [provider]  albuterol (VENTOLIN HFA) 108 (90 Base)  MCG/ACT inhaler TAKE 2 PUFFS BY MOUTH EVERY 6 HOURS AS NEEDED FOR WHEEZE OR SHORTNESS OF BREATH Patient taking differently: Inhale 2 puffs into the lungs every 6 (six) hours as needed for wheezing or shortness of breath. 11/02/21  Yes Rigoberto Noel, MD  carvedilol (COREG) 6.25 MG tablet Take 1 tablet (6.25 mg total) by mouth 2 (two) times daily with a meal. 01/25/22  Yes Emokpae, Courage, MD  furosemide (LASIX) 20 MG tablet Take 3 tablets (60 mg total) by mouth 2 (two) times daily. 01/25/22 01/25/23 Yes Emokpae, Courage, MD  gabapentin (NEURONTIN) 100 MG capsule Take 100 mg by mouth daily as needed (pain). 02/20/21  Yes [provider]  hydrOXYzine (VISTARIL) 50 MG capsule Take 1 capsule (50 mg total) by mouth 3 (three) times daily as needed for anxiety or nausea. 01/25/22  Yes Roxan Hockey, MD  levothyroxine (SYNTHROID) 175 MCG tablet Take 175 mcg by mouth daily before breakfast.   Yes [provider]  ondansetron (ZOFRAN) 8 MG tablet Take 8 mg by mouth every 8 (eight) hours as needed for nausea or vomiting.   Yes [provider]  pantoprazole (PROTONIX) 40 MG tablet Take 1 tablet (40 mg total) by mouth daily. 01/25/22  Yes Emokpae, Courage, MD  potassium chloride SA (KLOR-CON M) 20 MEQ tablet Take 1 tablet (20 mEq total) by mouth daily. Patient taking differently: Take 40 mEq by mouth 2 (two) times daily. 01/25/22  Yes Emokpae, Courage, MD  rifaximin (XIFAXAN) 550 MG TABS tablet Take 1 tablet (550 mg total) by mouth 2 (two) times daily. 01/25/22  Yes Emokpae, Courage, MD  sertraline (ZOLOFT) 50 MG tablet Take 1 tablet (50 mg total) by mouth at bedtime. 01/25/22  Yes Emokpae, Courage, MD  spironolactone (ALDACTONE) 50 MG tablet Take 1 tablet (50 mg total) by mouth daily. 01/26/22  Yes Emokpae, Courage, MD  traZODone (DESYREL) 50 MG tablet Take 1 tablet (50 mg total) by mouth at bedtime. 01/25/22  Yes Emokpae, Courage, MD  furosemide (LASIX) 40 MG tablet Take 40 mg by mouth  daily. Patient not taking: Reported on 02/27/2022 02/23/22   [provider]    Physical Exam: BP (!) 127/115   Pulse 88   Temp 98.2 F (36.8 C) (Oral)   Resp (!) 21   SpO2 96%   General: 66 y.o. year-old male ill appearing, but in no acute distress.  Alert and oriented x3. HEENT: NCAT, EOMI Neck: Supple, trachea medial Cardiovascular: Regular rate and rhythm with no rubs or gallops.  No thyromegaly or JVD noted.  +2 lower extremity edema. 2/4 pulses in all 4 extremities. Respiratory: Clear to auscultation with no wheezes or rales. Good inspiratory effort. Abdomen: Distended with tenderness to palpation of RUQ.  Normal bowel sounds x4 quadrants. Muskuloskeletal: Bilateral edema extending to the abdominal area.  No cyanosis or clubbing noted bilaterally Neuro: CN II-XII intact, strength 5/5 x 4, sensation, reflexes intact Skin: No ulcerative lesions  noted or rashes Psychiatry: Judgement and insight appear normal. Mood is appropriate for condition and setting          Labs on Admission:  Basic Metabolic Panel: Recent Labs  Lab 02/27/22 1838  NA 135  K 4.2  CL 101  CO2 25  GLUCOSE 168*  BUN 13  CREATININE 1.30*  CALCIUM 8.6*   Liver Function Tests: Recent Labs  Lab 02/27/22 1838  AST 53*  ALT 20  ALKPHOS 96  BILITOT 3.7*  PROT 6.6  ALBUMIN 2.9*   No results for input(s): "LIPASE", "AMYLASE" in the last 168 hours. Recent Labs  Lab 02/27/22 1905  AMMONIA 34   CBC: Recent Labs  Lab 02/27/22 1838  WBC 3.6*  HGB 11.6*  HCT 36.0*  MCV 89.8  PLT 68*   Cardiac Enzymes: No results for input(s): "CKTOTAL", "CKMB", "CKMBINDEX", "TROPONINI" in the last 168 hours.  BNP (last 3 results) Recent Labs    01/22/22 1755 02/27/22 1838  BNP 58.0 34.0    ProBNP (last 3 results) No results for input(s): "PROBNP" in the last 8760 hours.  CBG: No results for input(s): "GLUCAP" in the last 168 hours.  Radiological Exams on Admission: DG Chest Port 1  View  Result Date: 02/27/2022 CLINICAL DATA:  Shortness of breath.  Abdominal distension. EXAM: PORTABLE CHEST 1 VIEW COMPARISON:  01/22/2022 FINDINGS: Heart size and mediastinal contours are unremarkable. Lungs are hypoinflated. There is no pleural fluid or interstitial edema. No airspace disease. Visualized osseous structures appear intact. IMPRESSION: Low lung volumes. No acute findings. Electronically Signed   By: Kerby Moors M.D.   On: 02/27/2022 18:51    EKG: I independently viewed the EKG done and my findings are as followed: Normal sinus rhythm at a rate of 93 bpm  Assessment/Plan Present on Admission:  Anasarca  Liver cirrhosis secondary to NASH ([place])  Thrombocytopenia ([place])  Essential hypertension  Acquired hypothyroidism  Depression  Principal Problem:   Anasarca Active Problems:   Acquired hypothyroidism   Liver cirrhosis secondary to NASH ([place])   Depression   Thrombocytopenia ([facility])   Essential hypertension   Iron deficiency anemia   Hypoalbuminemia due to protein-calorie malnutrition ([facility])   GERD (gastroesophageal reflux disease)   Abdominal pain  NASH/cirrhosis decompensated with anasarca Patient presents with distended abdomen (?  Ascites) Patient was treated with IV Lasix 40 mg x 1, will check continue with same at this time and add home Aldactone  Continue rifaximin Ultrasound guided paracentesis will be ordered  Chronic thrombocytopenia Platelets 68, continue to monitor platelet levels  Iron deficiency anemia Continue ferrous sulfate   Hypoalbuminemia secondary to moderate protein calorie malnutrition Albumin 2.9, protein supplement to be provided  GERD/abdominal pain Continue Protonix Continue oxycodone as needed  Acquired hypothyroidism Continue Synthroid  Essential hypertension Continue Coreg, spironolactone, Lasix  Depression Continue Zoloft   DVT prophylaxis: SCDs  Code Status: Full code  Family Communication: None at  bedside  Consults: IR for paracentesis  Severity of Illness: The appropriate patient status for this patient is INPATIENT. Inpatient status is judged to be reasonable and necessary in order to provide the required intensity of service to ensure the patient's safety. The patient's presenting symptoms, physical exam findings, and initial radiographic and laboratory data in the context of their chronic comorbidities is felt to place them at high risk for further clinical deterioration. Furthermore, it is not anticipated that the patient will be medically stable for discharge from the hospital within 2 midnights of admission.   *  I certify that at the point of admission it is my clinical judgment that the patient will require inpatient hospital care spanning beyond 2 midnights from the point of admission due to high intensity of service, high risk for further deterioration and high frequency of surveillance required.*  Author: Bernadette Hoit, DO 02/28/2022 1:11 AM  For on call review [email or web address].

## 2022-02-27 NOTE — ED Triage Notes (Signed)
Pt here a month ago for fluid, was fine for about 2 weeks but states is swelling again. Pt has swelling all the way up to abd. Abd is distended and hard, hx of cirrhosis. Pt denies any swelling to groin area. Mild sob noted with sitting, worse with activity noted. A/o. Color wnl. C/o pain to legs. C/o sob.

## 2022-02-28 ENCOUNTER — Inpatient Hospital Stay (HOSPITAL_COMMUNITY): Payer: Medicare HMO

## 2022-02-28 DIAGNOSIS — D509 Iron deficiency anemia, unspecified: Secondary | ICD-10-CM | POA: Diagnosis not present

## 2022-02-28 DIAGNOSIS — K219 Gastro-esophageal reflux disease without esophagitis: Secondary | ICD-10-CM | POA: Diagnosis not present

## 2022-02-28 DIAGNOSIS — R109 Unspecified abdominal pain: Secondary | ICD-10-CM | POA: Insufficient documentation

## 2022-02-28 DIAGNOSIS — K7581 Nonalcoholic steatohepatitis (NASH): Secondary | ICD-10-CM | POA: Diagnosis not present

## 2022-02-28 DIAGNOSIS — E8809 Other disorders of plasma-protein metabolism, not elsewhere classified: Secondary | ICD-10-CM | POA: Insufficient documentation

## 2022-02-28 DIAGNOSIS — R601 Generalized edema: Secondary | ICD-10-CM | POA: Diagnosis not present

## 2022-02-28 LAB — CBC
HCT: 33.3 % — ABNORMAL LOW (ref 39.0–52.0)
Hemoglobin: 10.9 g/dL — ABNORMAL LOW (ref 13.0–17.0)
MCH: 29.2 pg (ref 26.0–34.0)
MCHC: 32.7 g/dL (ref 30.0–36.0)
MCV: 89.3 fL (ref 80.0–100.0)
Platelets: 67 10*3/uL — ABNORMAL LOW (ref 150–400)
RBC: 3.73 MIL/uL — ABNORMAL LOW (ref 4.22–5.81)
RDW: 17.9 % — ABNORMAL HIGH (ref 11.5–15.5)
WBC: 3.5 10*3/uL — ABNORMAL LOW (ref 4.0–10.5)
nRBC: 0 % (ref 0.0–0.2)

## 2022-02-28 LAB — URINALYSIS, ROUTINE W REFLEX MICROSCOPIC
Bacteria, UA: NONE SEEN
Bilirubin Urine: NEGATIVE
Glucose, UA: NEGATIVE mg/dL
Ketones, ur: NEGATIVE mg/dL
Leukocytes,Ua: NEGATIVE
Nitrite: NEGATIVE
Protein, ur: NEGATIVE mg/dL
Specific Gravity, Urine: 1.005 (ref 1.005–1.030)
pH: 6 (ref 5.0–8.0)

## 2022-02-28 LAB — COMPREHENSIVE METABOLIC PANEL
ALT: 19 U/L (ref 0–44)
AST: 47 U/L — ABNORMAL HIGH (ref 15–41)
Albumin: 3 g/dL — ABNORMAL LOW (ref 3.5–5.0)
Alkaline Phosphatase: 91 U/L (ref 38–126)
Anion gap: 7 (ref 5–15)
BUN: 12 mg/dL (ref 8–23)
CO2: 28 mmol/L (ref 22–32)
Calcium: 8.6 mg/dL — ABNORMAL LOW (ref 8.9–10.3)
Chloride: 102 mmol/L (ref 98–111)
Creatinine, Ser: 1.35 mg/dL — ABNORMAL HIGH (ref 0.61–1.24)
GFR, Estimated: 59 mL/min — ABNORMAL LOW (ref >60)
Glucose, Bld: 108 mg/dL — ABNORMAL HIGH (ref 70–99)
Potassium: 3.4 mmol/L — ABNORMAL LOW (ref 3.5–5.1)
Sodium: 137 mmol/L (ref 135–145)
Total Bilirubin: 3.7 mg/dL — ABNORMAL HIGH (ref 0.3–1.2)
Total Protein: 6.5 g/dL (ref 6.5–8.1)

## 2022-02-28 LAB — PHOSPHORUS: Phosphorus: 3.4 mg/dL (ref 2.5–4.6)

## 2022-02-28 LAB — GLUCOSE, CAPILLARY: Glucose-Capillary: 133 mg/dL — ABNORMAL HIGH (ref 70–99)

## 2022-02-28 LAB — PROTIME-INR
INR: 1.7 — ABNORMAL HIGH (ref 0.8–1.2)
Prothrombin Time: 19.5 seconds — ABNORMAL HIGH (ref 11.4–15.2)

## 2022-02-28 LAB — MAGNESIUM: Magnesium: 2 mg/dL (ref 1.7–2.4)

## 2022-02-28 MED ORDER — POTASSIUM CHLORIDE CRYS ER 20 MEQ PO TBCR
40.0000 meq | EXTENDED_RELEASE_TABLET | Freq: Two times a day (BID) | ORAL | Status: DC
  Administered 2022-02-28 – 2022-03-04 (×8): 40 meq via ORAL
  Filled 2022-02-28 (×8): qty 2

## 2022-02-28 MED ORDER — ALBUMIN HUMAN 25 % IV SOLN
INTRAVENOUS | Status: AC
  Filled 2022-02-28: qty 100

## 2022-02-28 MED ORDER — OXYCODONE HCL 5 MG PO TABS
5.0000 mg | ORAL_TABLET | ORAL | Status: DC | PRN
  Administered 2022-02-28 – 2022-03-03 (×8): 5 mg via ORAL
  Filled 2022-02-28 (×9): qty 1

## 2022-02-28 MED ORDER — ALBUMIN HUMAN 25 % IV SOLN
12.5000 g | Freq: Once | INTRAVENOUS | Status: DC
  Filled 2022-02-28: qty 50

## 2022-02-28 MED ORDER — SPIRONOLACTONE 25 MG PO TABS
50.0000 mg | ORAL_TABLET | Freq: Every day | ORAL | Status: DC
  Administered 2022-03-01: 50 mg via ORAL
  Filled 2022-02-28: qty 2

## 2022-02-28 MED ORDER — SERTRALINE HCL 50 MG PO TABS
50.0000 mg | ORAL_TABLET | Freq: Every day | ORAL | Status: DC
  Administered 2022-02-28 – 2022-03-03 (×5): 50 mg via ORAL
  Filled 2022-02-28 (×5): qty 1

## 2022-02-28 MED ORDER — FUROSEMIDE 10 MG/ML IJ SOLN
40.0000 mg | Freq: Two times a day (BID) | INTRAMUSCULAR | Status: DC
  Administered 2022-02-28 – 2022-03-03 (×7): 40 mg via INTRAVENOUS
  Filled 2022-02-28 (×7): qty 4

## 2022-02-28 MED ORDER — FERROUS SULFATE 325 (65 FE) MG PO TABS
325.0000 mg | ORAL_TABLET | ORAL | Status: DC
  Administered 2022-02-28 – 2022-03-04 (×3): 325 mg via ORAL
  Filled 2022-02-28 (×3): qty 1

## 2022-02-28 MED ORDER — PANTOPRAZOLE SODIUM 40 MG PO TBEC
40.0000 mg | DELAYED_RELEASE_TABLET | Freq: Every day | ORAL | Status: DC
  Administered 2022-02-28 – 2022-03-04 (×5): 40 mg via ORAL
  Filled 2022-02-28 (×5): qty 1

## 2022-02-28 MED ORDER — ALBUMIN HUMAN 25 % IV SOLN
25.0000 g | Freq: Once | INTRAVENOUS | Status: AC
  Administered 2022-02-28: 25 g via INTRAVENOUS
  Filled 2022-02-28: qty 100

## 2022-02-28 MED ORDER — HYDROXYZINE HCL 25 MG PO TABS
50.0000 mg | ORAL_TABLET | Freq: Three times a day (TID) | ORAL | Status: DC | PRN
  Administered 2022-03-03: 50 mg via ORAL
  Filled 2022-02-28 (×2): qty 2

## 2022-02-28 MED ORDER — TRAZODONE HCL 50 MG PO TABS
50.0000 mg | ORAL_TABLET | Freq: Every day | ORAL | Status: DC
  Administered 2022-02-28 – 2022-03-03 (×4): 50 mg via ORAL
  Filled 2022-02-28 (×4): qty 1

## 2022-02-28 MED ORDER — POTASSIUM CHLORIDE CRYS ER 20 MEQ PO TBCR
20.0000 meq | EXTENDED_RELEASE_TABLET | Freq: Once | ORAL | Status: AC
  Administered 2022-02-28: 20 meq via ORAL
  Filled 2022-02-28: qty 1

## 2022-02-28 MED ORDER — CARVEDILOL 3.125 MG PO TABS
6.2500 mg | ORAL_TABLET | Freq: Two times a day (BID) | ORAL | Status: DC
  Administered 2022-02-28 – 2022-03-04 (×9): 6.25 mg via ORAL
  Filled 2022-02-28 (×9): qty 2

## 2022-02-28 MED ORDER — RIFAXIMIN 550 MG PO TABS
550.0000 mg | ORAL_TABLET | Freq: Two times a day (BID) | ORAL | Status: DC
  Administered 2022-02-28 – 2022-03-04 (×8): 550 mg via ORAL
  Filled 2022-02-28 (×8): qty 1

## 2022-02-28 MED ORDER — LEVOTHYROXINE SODIUM 50 MCG PO TABS: 175.0000 ug | ORAL_TABLET | Freq: Every day | ORAL | Status: DC

## 2022-02-28 MED ORDER — LEVOTHYROXINE SODIUM 75 MCG PO TABS
175.0000 ug | ORAL_TABLET | Freq: Every day | ORAL | Status: DC
  Administered 2022-02-28 – 2022-03-04 (×5): 175 ug via ORAL
  Filled 2022-02-28 (×2): qty 1
  Filled 2022-02-28: qty 4
  Filled 2022-02-28 (×2): qty 1

## 2022-02-28 MED ORDER — GLUCERNA SHAKE PO LIQD
237.0000 mL | Freq: Three times a day (TID) | ORAL | Status: DC
  Administered 2022-02-28 – 2022-03-03 (×7): 237 mL via ORAL
  Filled 2022-02-28 (×5): qty 237

## 2022-02-28 NOTE — Consult Note (Signed)
Gastroenterology Consult   Referring Provider: No ref. provider found Primary Care Physician:  [name], MD Primary Gastroenterologist:  Dr. Jenetta Downer   Patient ID: Lorne Skeens; [id number]; 06/11/1955   Admit date: 02/27/2022  LOS: 1 day   Date of Consultation: 02/28/2022  Reason for Consultation:  decompensated Cirrhosis, anasarca   History of Present Illness   Horace Laswell is a 66 y.o. year old male with medical history of  type 2 diabetes mellitus, hypertension, hypothyroidism, CHF, cirrhosis due to NASH and thrombocytopenia who presented to the ED with c/o SOB and swelling, worse over the past 3 days. recently admitted from 10/30 to 11/2 due to NASH/cirrhosis decompensated with anasarca in the setting of CHF and chronic kidney disease. He was treated with IV Lasix and oral Aldactone and outpatient IR consult for possible TIPS which he reports he was unable to have done.  ED Course:  Hemodynamically stable, WBC 3.6, hgb 11.6, plt 68k, ammonia 34, BNP 34, AST 53, creatnine 1.3, T bili 3.7 IV lasix '40mg'$  x1 given  [place] Abd with insufficient ascites for paracentesis   Consult: States that swelling was well controlled for about 2.5 weeks after last admission then began again. Reports compliance with his diuretics. Had evaluation for TIPS procedure but was not a candidate due to his CHF. Reports worsening sob and swelling to abdomen and legs.  No vomiting, occasional nausea, no rectal bleeding or melena. He is taking '100mg'$  lasix and '50mg'$  spironolactone daily. Doing well with xifaxan BID having 3-4 BMs per day. Denies changes in mental status. Has mild pruritus at baseline. No jaundice.    Past Medical History:  Diagnosis Date   Anemia    Anxiety    Asthma    CHF (congestive heart failure) ([place])    CKD (chronic kidney disease)    Depression    Diabetes mellitus without complication ([place])    Dyspnea    Hypertension    Hypothyroidism    Liver cirrhosis secondary to NASH ([place])     NASH (nonalcoholic steatohepatitis)    Pre-diabetes    Spleen enlarged    Thrombocytopenia ([place])     Past Surgical History:  Procedure Laterality Date   Bilateral hernia surgery     2006, 2007   CHOLECYSTECTOMY     2016    COLONOSCOPY WITH PROPOFOL N/A 06/28/2021   Procedure: COLONOSCOPY WITH PROPOFOL;  Surgeon: Rogene Houston, MD;  [facility];  Service: Endoscopy;  Laterality: N/A;  1020   ESOPHAGEAL DILATION N/A 11/06/2019   Procedure: ESOPHAGEAL DILATION;  Surgeon: Harvel Quale, MD;  Location: [facility];  Service: Gastroenterology;  Laterality: N/A;   ESOPHAGOGASTRODUODENOSCOPY (EGD) WITH PROPOFOL N/A 11/06/2019   Procedure: ESOPHAGOGASTRODUODENOSCOPY (EGD) WITH PROPOFOL;  Surgeon: Harvel Quale, MD;  [facility];  Service: Gastroenterology;  Laterality: N/A;  1045   IR RADIOLOGIST EVAL & MGMT  02/19/2022   LAPAROSCOPIC ASSISTED VENTRAL HERNIA REPAIR     Polyp removed     in January of 2018.    POLYPECTOMY  06/28/2021   Procedure: POLYPECTOMY INTESTINAL;  Surgeon: Rogene Houston, MD;  [facility]: [facility];  Service: Endoscopy;;   RIGHT HEART CATH N/A 01/08/2022   Procedure: RIGHT HEART CATH;  Surgeon: Jettie Booze, MD;  Location: [facility];  Service: Cardiovascular;  Laterality: N/A;    Prior to Admission medications   Medication Sig Start Date End Date Taking? Authorizing Provider  acetaminophen (TYLENOL) 500 MG tablet Take  1,000 mg by mouth every 6 (six) hours as needed for moderate pain.   Yes [provider]  albuterol (VENTOLIN HFA) 108 (90 Base) MCG/ACT inhaler TAKE 2 PUFFS BY MOUTH EVERY 6 HOURS AS NEEDED FOR WHEEZE OR SHORTNESS OF BREATH Patient taking differently: Inhale 2 puffs into the lungs every 6 (six) hours as needed for wheezing or shortness of breath. 11/02/21  Yes Rigoberto Noel, MD  carvedilol (COREG) 6.25 MG tablet Take 1 tablet (6.25 mg total) by mouth 2 (two) times daily with a  meal. 01/25/22  Yes Emokpae, Courage, MD  furosemide (LASIX) 20 MG tablet Take 3 tablets (60 mg total) by mouth 2 (two) times daily. 01/25/22 01/25/23 Yes Emokpae, Courage, MD  gabapentin (NEURONTIN) 100 MG capsule Take 100 mg by mouth daily as needed (pain). 02/20/21  Yes [provider]  hydrOXYzine (VISTARIL) 50 MG capsule Take 1 capsule (50 mg total) by mouth 3 (three) times daily as needed for anxiety or nausea. 01/25/22  Yes Roxan Hockey, MD  levothyroxine (SYNTHROID) 175 MCG tablet Take 175 mcg by mouth daily before breakfast.   Yes [provider]  ondansetron (ZOFRAN) 8 MG tablet Take 8 mg by mouth every 8 (eight) hours as needed for nausea or vomiting.   Yes [provider]  pantoprazole (PROTONIX) 40 MG tablet Take 1 tablet (40 mg total) by mouth daily. 01/25/22  Yes Emokpae, Courage, MD  potassium chloride SA (KLOR-CON M) 20 MEQ tablet Take 1 tablet (20 mEq total) by mouth daily. Patient taking differently: Take 40 mEq by mouth 2 (two) times daily. 01/25/22  Yes Emokpae, Courage, MD  rifaximin (XIFAXAN) 550 MG TABS tablet Take 1 tablet (550 mg total) by mouth 2 (two) times daily. 01/25/22  Yes Emokpae, Courage, MD  sertraline (ZOLOFT) 50 MG tablet Take 1 tablet (50 mg total) by mouth at bedtime. 01/25/22  Yes Emokpae, Courage, MD  spironolactone (ALDACTONE) 50 MG tablet Take 1 tablet (50 mg total) by mouth daily. 01/26/22  Yes Emokpae, Courage, MD  traZODone (DESYREL) 50 MG tablet Take 1 tablet (50 mg total) by mouth at bedtime. 01/25/22  Yes Roxan Hockey, MD    Current Facility-Administered Medications  Medication Dose Route Frequency Provider Last Rate Last Admin   carvedilol (COREG) tablet 6.25 mg  6.25 mg Oral BID WC Adefeso, Oladapo, DO   6.25 mg at 02/28/22 0833   feeding supplement (GLUCERNA SHAKE) (GLUCERNA SHAKE) liquid 237 mL  237 mL Oral TID BM Adefeso, Oladapo, DO   237 mL at 02/28/22 1113   ferrous sulfate tablet 325 mg  325 mg Oral QODAY Adefeso,  Oladapo, DO   325 mg at 02/28/22 0917   furosemide (LASIX) injection 40 mg  40 mg Intravenous Q12H Adefeso, Oladapo, DO   40 mg at 02/28/22 0519   hydrOXYzine (ATARAX) tablet 50 mg  50 mg Oral TID PRN Wynetta Emery, Clanford L, MD       levothyroxine (SYNTHROID) tablet 175 mcg  175 mcg Oral Q0600 Adefeso, Oladapo, DO   175 mcg at 02/28/22 0519   oxyCODONE (Oxy IR/ROXICODONE) immediate release tablet 5 mg  5 mg Oral Q4H PRN Adefeso, Oladapo, DO   5 mg at 02/28/22 0519   pantoprazole (PROTONIX) EC tablet 40 mg  40 mg Oral Daily Adefeso, Oladapo, DO   40 mg at 02/28/22 0916   potassium chloride SA (KLOR-CON M) CR tablet 40 mEq  40 mEq Oral BID Johnson, Clanford L, MD       rifaximin Doreene Nest)  tablet 550 mg  550 mg Oral BID Adefeso, Oladapo, DO       sertraline (ZOLOFT) tablet 50 mg  50 mg Oral QHS Adefeso, Oladapo, DO   50 mg at 02/28/22 0038   spironolactone (ALDACTONE) tablet 50 mg  50 mg Oral Daily Adefeso, Oladapo, DO       traZODone (DESYREL) tablet 50 mg  50 mg Oral QHS Johnson, Clanford L, MD        Allergies as of 02/27/2022   (No Known Allergies)    Family History  Problem Relation Age of Onset   Liver cancer Mother    Heart disease Mother    Aneurysm Sister     Social History   Socioeconomic History   Marital status: Single    Spouse name: Not on file   Number of children: Not on file   Years of education: Not on file   Highest education level: Not on file  Occupational History   Not on file  Tobacco Use   Smoking status: Former    Packs/day: 1.00    Years: 20.00    Total pack years: 20.00    Types: Cigarettes    Quit date: 2017    Years since quitting: 6.9   Smokeless tobacco: Never  Vaping Use   Vaping Use: Never used  Substance and Sexual Activity   Alcohol use: No   Drug use: No   Sexual activity: Not on file  Other Topics Concern   Not on file  Social History Narrative   Not on file   Social Determinants of Health   Financial Resource Strain: Not on file   Food Insecurity: No Food Insecurity (01/23/2022)   Hunger Vital Sign    Worried About Running Out of Food in the Last Year: Never true    Ran Out of Food in the Last Year: Never true  Transportation Needs: No Transportation Needs (01/23/2022)   PRAPARE - Hydrologist (Medical): No    Lack of Transportation (Non-Medical): No  Physical Activity: Not on file  Stress: Not on file  Social Connections: Not on file  Intimate Partner Violence: Not At Risk (01/23/2022)   Humiliation, Afraid, Rape, and Kick questionnaire    Fear of Current or Ex-Partner: No    Emotionally Abused: No    Physically Abused: No    Sexually Abused: No     Review of Systems   Gen: Denies any fever, chills, loss of appetite, change in weight or weight loss CV: Denies chest pain, heart palpitations, syncope, +edema Resp: Denies cough, wheezing, coughing up blood, and pleurisy. +SOB GI: Denies melena, hematochezia, nausea, vomiting, diarrhea, constipation, dysphagia, odyonophagia, early satiety or weight loss. +swelling to abdomen GU : Denies urinary burning, blood in urine, urinary frequency, and urinary incontinence. MS: Denies joint pain, limitation of movement, swelling, cramps, and atrophy.  Derm: Denies rash, itching, dry skin, hives. Psych: Denies depression, anxiety, memory loss, hallucinations, and confusion. Heme: Denies bruising or bleeding Neuro:  Denies any headaches, dizziness, paresthesias, shaking  Physical Exam   Vital Signs in last 24 hours: Temp:  [97.9 F (36.6 C)-98.5 F (36.9 C)] 98.5 F (36.9 C) (12/06 0918) Pulse Rate:  [69-98] 76 (12/06 1230) Resp:  [12-23] 12 (12/06 1230) BP: (126-187)/(66-115) 143/80 (12/06 1230) SpO2:  [88 %-100 %] 95 % (12/06 1230) Last BM Date : 02/26/22  General:   Alert,  Well-developed, well-nourished, pleasant and cooperative in NAD Head:  Normocephalic and atraumatic. Eyes:  scleral icterus Ears:  Normal auditory  acuity. Mouth:  No deformity or lesions, dentition normal. Lungs:  Clear throughout to auscultation.   No wheezes, crackles, or rhonchi. No acute distress. Heart:  Regular rate and rhythm; no murmurs, clicks, rubs,  or gallops. Abdomen: abdomen distended, mostly soft, no TTP.      Msk:  Symmetrical without gross deformities. Normal posture. Extremities:  2+ pitting edema up to knees, non pitting edema to thighs, mild non pitting edema to UEs  Neurologic:  Alert and  oriented x4. Skin:  Intact without significant lesions or rashes. Psych:  Alert and cooperative. Normal mood and affect. No asterixis   Intake/Output from previous day: 12/05 0701 - 12/06 0700 In: 100 [IV Piggyback:100] Out: 1000 [Urine:1000] Intake/Output this shift: No intake/output data recorded.  '@WEIGHTS'$ @  Labs/Studies   Recent Labs Recent Labs    02/27/22 1838 02/28/22 0521  WBC 3.6* 3.5*  HGB 11.6* 10.9*  HCT 36.0* 33.3*  PLT 68* 67*   BMET Recent Labs    02/27/22 1838 02/28/22 0521  NA 135 137  K 4.2 3.4*  CL 101 102  CO2 25 28  GLUCOSE 168* 108*  BUN 13 12  CREATININE 1.30* 1.35*  CALCIUM 8.6* 8.6*   LFT Recent Labs    02/27/22 1838 02/28/22 0521  PROT 6.6 6.5  ALBUMIN 2.9* 3.0*  AST 53* 47*  ALT 20 19  ALKPHOS 96 91  BILITOT 3.7* 3.7*    Radiology/Studies [place] ASCITES (ABDOMEN LIMITED)  Result Date: 02/28/2022 CLINICAL DATA:  Ascites, history CHF, chronic kidney disease, diabetes mellitus, hypertension, cirrhosis due to NASH EXAM: LIMITED ABDOMEN ULTRASOUND FOR ASCITES TECHNIQUE: Limited ultrasound survey for ascites was performed in all four abdominal quadrants. COMPARISON:  01/23/2022 FINDINGS: Trace ascites identified. Volume of ascites is insufficient for paracentesis. IMPRESSION: Insufficient ascites for paracentesis. Electronically Signed   By: Lavonia Dana M.D.   On: 02/28/2022 08:52   DG Chest Port 1 View  Result Date: 02/27/2022 CLINICAL DATA:  Shortness of breath.   Abdominal distension. EXAM: PORTABLE CHEST 1 VIEW COMPARISON:  01/22/2022 FINDINGS: Heart size and mediastinal contours are unremarkable. Lungs are hypoinflated. There is no pleural fluid or interstitial edema. No airspace disease. Visualized osseous structures appear intact. IMPRESSION: Low lung volumes. No acute findings. Electronically Signed   By: Kerby Moors M.D.   On: 02/27/2022 18:51     Assessment   Seab Cockrell is a 66 y.o. year old male  with medical history of  type 2 diabetes mellitus, hypertension, hypothyroidism, CHF, cirrhosis due to NASH and thrombocytopenia present for SOB and fluid retention in setting of decompensated cirrhosis. Recent admission for similar presentation in October/November.  Decompensated Cirrhosis with fluid overload: fluid retention for the past 2-3 weeks, previously evaluated for TIPS and not a candidate due to other multimorbidities. Reports compliance with medications at home. On Lasix '100mg'$  and spironolactone '50mg'$  daily as outpatient, as he states he most of the time only takes '40mg'$  lasix in the evening. Does endorse good urine output at home some days though other days urine output is much less. Started on Lasix '40mg'$  BID and spironolactone '50mg'$  this admission, has had 1L of urine output today. [place] abd this morning with ascites not sufficient enough for paracentesis. Albumin was 2.9, He received 25g of Albumin last night. Feels SOB Schue be somewhat improved. Will need diuresis with close monitoring on renal function as creatnine is 1.35. Marland Kitchen   Denies confusion, does well on xifaxan '550mg'$  BID as  outpatient. Lactulose caused fecal incontinence previously. EGD in 2021 w/o varices. Ammonia 34 on admission. No signs of HE. MELD Na 20. INR 1.7, AST 47, T bili 3.7. previously evaluated by [facility] for liver transplant, though not a candidate due to his BMI, multi-morbidities. unsure of true euvolemic weight as he tends to remain somewhat fluid overloaded.   Colon polyps:  colonoscopy in April 2023 with tubular adenomas, polyp in cecum not removed, recommended repeat colonoscopy which has been postponed due to continued fluid overload.   Plan / Recommendations   -trend LFTs and INR daily -Monitor for signs of HE -will check INR -2g sodium diet -continue lasix '40mg'$  BID and spironolactone '50mg'$  daily for now  -Continue xifaxan '550mg'$  BID -close monitoring of renal function  -continue PPI   02/28/2022, 12:47 PM  Odalis Jordan L. Alver Sorrow, MSN, APRN, AGNP-C Adult-Gerontology Nurse Practitioner [facility]

## 2022-02-28 NOTE — Hospital Course (Signed)
Gastroenterology Consult   Referring Provider: No ref. provider found Primary Care Physician:  [name], MD Primary Gastroenterologist:  Susanne Greenhouse   Patient ID: Daniel Crawford [id number]; 12-May-1955   Admit date: 02/27/2022  LOS: 1 day   Date of Consultation: 02/28/2022  Reason for Consultation:  anasarca   History of Present Illness   Daniel Crawford is a 66 y.o. year old male anxiety, CHF, CKD, depression, diabetes, HTN, hypothyroidism, NASH cirrhosis diagnosed in 2017 via liver biopsy c/b splenomegaly, recent ascites, HE, and thrombocytopenia who presented to the ED with SOB and fluid rentention.  ED Course:  WBC 3.6, Hgb 11.6, plt 68k, Calcium 8.6, Albumin 2.9, AST 53, T bili 3.7  BNP 34, Ammonia 34 [place] Abd limited with trace ascites identified, insufficient for paracentesis    Consult:  Patient well known to our service, admitted in October/november for similar presentation.         Past Medical History:  Diagnosis Date   Anemia    Anxiety    Asthma    CHF (congestive heart failure) ([place])    CKD (chronic kidney disease)    Depression    Diabetes mellitus without complication ([place])    Dyspnea    Hypertension    Hypothyroidism    Liver cirrhosis secondary to NASH ([place])    NASH (nonalcoholic steatohepatitis)    Pre-diabetes    Spleen enlarged    Thrombocytopenia ([place])     Past Surgical History:  Procedure Laterality Date   Bilateral hernia surgery     2006, 2007   CHOLECYSTECTOMY     2016    COLONOSCOPY WITH PROPOFOL N/A 06/28/2021   Procedure: COLONOSCOPY WITH PROPOFOL;  Surgeon: Rogene Houston, MD;  [facility];  Service: Endoscopy;  Laterality: N/A;  1020   ESOPHAGEAL DILATION N/A 11/06/2019   Procedure: ESOPHAGEAL DILATION;  Surgeon: Harvel Quale, MD;  [facility];  Service: Gastroenterology;  Laterality: N/A;   ESOPHAGOGASTRODUODENOSCOPY (EGD) WITH PROPOFOL N/A 11/06/2019   Procedure: ESOPHAGOGASTRODUODENOSCOPY  (EGD) WITH PROPOFOL;  Surgeon: Harvel Quale, MD;  [facility];  Service: Gastroenterology;  Laterality: N/A;  1045   IR RADIOLOGIST EVAL & MGMT  02/19/2022   LAPAROSCOPIC ASSISTED VENTRAL HERNIA REPAIR     Polyp removed     in January of 2018.    POLYPECTOMY  06/28/2021   Procedure: POLYPECTOMY INTESTINAL;  Surgeon: Rogene Houston, MD;  [facility];  Service: Endoscopy;;   RIGHT HEART CATH N/A 01/08/2022   Procedure: RIGHT HEART CATH;  Surgeon: Jettie Booze, MD;  Location: [place] [facility];  Service: Cardiovascular;  Laterality: N/A;    Prior to Admission medications   Medication Sig Start Date End Date Taking? Authorizing Provider  acetaminophen (TYLENOL) 500 MG tablet Take 1,000 mg by mouth every 6 (six) hours as needed for moderate pain.   Yes [provider]  albuterol (VENTOLIN HFA) 108 (90 Base) MCG/ACT inhaler TAKE 2 PUFFS BY MOUTH EVERY 6 HOURS AS NEEDED FOR WHEEZE OR SHORTNESS OF BREATH Patient taking differently: Inhale 2 puffs into the lungs every 6 (six) hours as needed for wheezing or shortness of breath. 11/02/21  Yes Rigoberto Noel, MD  carvedilol (COREG) 6.25 MG tablet Take 1 tablet (6.25 mg total) by mouth 2 (two) times daily with a meal. 01/25/22  Yes Emokpae, Courage, MD  furosemide (LASIX) 20 MG tablet Take 3 tablets (60 mg total) by mouth 2 (two) times daily. 01/25/22 01/25/23  Yes Emokpae, Courage, MD  gabapentin (NEURONTIN) 100 MG capsule Take 100 mg by mouth daily as needed (pain). 02/20/21  Yes [provider]  hydrOXYzine (VISTARIL) 50 MG capsule Take 1 capsule (50 mg total) by mouth 3 (three) times daily as needed for anxiety or nausea. 01/25/22  Yes Roxan Hockey, MD  levothyroxine (SYNTHROID) 175 MCG tablet Take 175 mcg by mouth daily before breakfast.   Yes [provider]  ondansetron (ZOFRAN) 8 MG tablet Take 8 mg by mouth every 8 (eight) hours as needed for nausea or vomiting.   Yes [provider]  pantoprazole (PROTONIX) 40 MG tablet Take 1 tablet (40 mg total) by mouth daily. 01/25/22  Yes Emokpae, Courage, MD  potassium chloride SA (KLOR-CON M) 20 MEQ tablet Take 1 tablet (20 mEq total) by mouth daily. Patient taking differently: Take 40 mEq by mouth 2 (two) times daily. 01/25/22  Yes Emokpae, Courage, MD  rifaximin (XIFAXAN) 550 MG TABS tablet Take 1 tablet (550 mg total) by mouth 2 (two) times daily. 01/25/22  Yes Emokpae, Courage, MD  sertraline (ZOLOFT) 50 MG tablet Take 1 tablet (50 mg total) by mouth at bedtime. 01/25/22  Yes Emokpae, Courage, MD  spironolactone (ALDACTONE) 50 MG tablet Take 1 tablet (50 mg total) by mouth daily. 01/26/22  Yes Emokpae, Courage, MD  traZODone (DESYREL) 50 MG tablet Take 1 tablet (50 mg total) by mouth at bedtime. 01/25/22  Yes Emokpae, Courage, MD  furosemide (LASIX) 40 MG tablet Take 40 mg by mouth daily. Patient not taking: Reported on 02/27/2022 02/23/22   [provider]    Current Facility-Administered Medications  Medication Dose Route Frequency Provider Last Rate Last Admin   carvedilol (COREG) tablet 6.25 mg  6.25 mg Oral BID WC Adefeso, Oladapo, DO   6.25 mg at 02/28/22 0833   feeding supplement (GLUCERNA SHAKE) (GLUCERNA SHAKE) liquid 237 mL  237 mL Oral TID BM Adefeso, Oladapo, DO       ferrous sulfate tablet 325 mg  325 mg Oral QODAY Adefeso, Oladapo, DO   325 mg at 02/28/22 0917   furosemide (LASIX) injection 40 mg  40 mg Intravenous Q12H Adefeso, Oladapo, DO   40 mg at 02/28/22 0519   levothyroxine (SYNTHROID) tablet 175 mcg  175 mcg Oral Q0600 Adefeso, Oladapo, DO   175 mcg at 02/28/22 9924   oxyCODONE (Oxy IR/ROXICODONE) immediate release tablet 5 mg  5 mg Oral Q4H PRN Adefeso, Oladapo, DO   5 mg at 02/28/22 0519   pantoprazole (PROTONIX) EC tablet 40 mg  40 mg Oral Daily Adefeso, Oladapo, DO   40 mg at 02/28/22 0916   rifaximin (XIFAXAN) tablet 550 mg  550 mg Oral BID Adefeso, Oladapo, DO       sertraline (ZOLOFT)  tablet 50 mg  50 mg Oral QHS Adefeso, Oladapo, DO   50 mg at 02/28/22 0038   spironolactone (ALDACTONE) tablet 50 mg  50 mg Oral Daily Adefeso, Oladapo, DO       Current Outpatient Medications  Medication Sig Dispense Refill   acetaminophen (TYLENOL) 500 MG tablet Take 1,000 mg by mouth every 6 (six) hours as needed for moderate pain.     albuterol (VENTOLIN HFA) 108 (90 Base) MCG/ACT inhaler TAKE 2 PUFFS BY MOUTH EVERY 6 HOURS AS NEEDED FOR WHEEZE OR SHORTNESS OF BREATH (Patient taking differently: Inhale 2 puffs into the lungs every 6 (six) hours as needed for wheezing or shortness of breath.) 8.5 each 0   carvedilol (COREG)  6.25 MG tablet Take 1 tablet (6.25 mg total) by mouth 2 (two) times daily with a meal. 180 tablet 3   furosemide (LASIX) 20 MG tablet Take 3 tablets (60 mg total) by mouth 2 (two) times daily. 180 tablet 11   gabapentin (NEURONTIN) 100 MG capsule Take 100 mg by mouth daily as needed (pain).     hydrOXYzine (VISTARIL) 50 MG capsule Take 1 capsule (50 mg total) by mouth 3 (three) times daily as needed for anxiety or nausea. 90 capsule 2   levothyroxine (SYNTHROID) 175 MCG tablet Take 175 mcg by mouth daily before breakfast.     ondansetron (ZOFRAN) 8 MG tablet Take 8 mg by mouth every 8 (eight) hours as needed for nausea or vomiting.     pantoprazole (PROTONIX) 40 MG tablet Take 1 tablet (40 mg total) by mouth daily. 90 tablet 3   potassium chloride SA (KLOR-CON M) 20 MEQ tablet Take 1 tablet (20 mEq total) by mouth daily. (Patient taking differently: Take 40 mEq by mouth 2 (two) times daily.) 30 tablet 0   rifaximin (XIFAXAN) 550 MG TABS tablet Take 1 tablet (550 mg total) by mouth 2 (two) times daily. 180 tablet 3   sertraline (ZOLOFT) 50 MG tablet Take 1 tablet (50 mg total) by mouth at bedtime. 90 tablet 3   spironolactone (ALDACTONE) 50 MG tablet Take 1 tablet (50 mg total) by mouth daily. 90 tablet 3   traZODone (DESYREL) 50 MG tablet Take 1 tablet (50 mg total) by mouth  at bedtime. 90 tablet 3   furosemide (LASIX) 40 MG tablet Take 40 mg by mouth daily. (Patient not taking: Reported on 02/27/2022)      Allergies as of 02/27/2022   (No Known Allergies)    Family History  Problem Relation Age of Onset   Liver cancer Mother    Heart disease Mother    Aneurysm Sister     Social History   Socioeconomic History   Marital status: Single    Spouse name: Not on file   Number of children: Not on file   Years of education: Not on file   Highest education level: Not on file  Occupational History   Not on file  Tobacco Use   Smoking status: Former    Packs/day: 1.00    Years: 20.00    Total pack years: 20.00    Types: Cigarettes    Quit date: 2017    Years since quitting: 6.9   Smokeless tobacco: Never  Vaping Use   Vaping Use: Never used  Substance and Sexual Activity   Alcohol use: No   Drug use: No   Sexual activity: Not on file  Other Topics Concern   Not on file  Social History Narrative   Not on file   Social Determinants of Health   Financial Resource Strain: Not on file  Food Insecurity: No Food Insecurity (01/23/2022)   Hunger Vital Sign    Worried About Running Out of Food in the Last Year: Never true    Ran Out of Food in the Last Year: Never true  Transportation Needs: No Transportation Needs (01/23/2022)   PRAPARE - Hydrologist (Medical): No    Lack of Transportation (Non-Medical): No  Physical Activity: Not on file  Stress: Not on file  Social Connections: Not on file  Intimate Partner Violence: Not At Risk (01/23/2022)   Humiliation, Afraid, Rape, and Kick questionnaire    Fear of Current or  Ex-Partner: No    Emotionally Abused: No    Physically Abused: No    Sexually Abused: No     Review of Systems   Gen: Denies any fever, chills, loss of appetite, change in weight or weight loss CV: Denies chest pain, heart palpitations, syncope, edema  Resp: Denies shortness of breath with  rest, cough, wheezing, coughing up blood, and pleurisy. GI: Denies vomiting blood, jaundice, and fecal incontinence.   Denies dysphagia or odynophagia. GU : Denies urinary burning, blood in urine, urinary frequency, and urinary incontinence. MS: Denies joint pain, limitation of movement, swelling, cramps, and atrophy.  Derm: Denies rash, itching, dry skin, hives. Psych: Denies depression, anxiety, memory loss, hallucinations, and confusion. Heme: Denies bruising or bleeding Neuro:  Denies any headaches, dizziness, paresthesias, shaking  Physical Exam   Vital Signs in last 24 hours: Temp:  [97.9 F (36.6 C)-98.5 F (36.9 C)] 98.5 F (36.9 C) (12/06 0918) Pulse Rate:  [80-98] 83 (12/06 0918) Resp:  [14-23] 22 (12/06 0918) BP: (127-187)/(66-115) 131/75 (12/06 0918) SpO2:  [88 %-100 %] 94 % (12/06 0918) Last BM Date : 02/26/22  General:   Alert,  Well-developed, well-nourished, pleasant and cooperative in NAD Head:  Normocephalic and atraumatic. Eyes:  Sclera clear, no icterus.   Conjunctiva pink. Ears:  Normal auditory acuity. Mouth:  No deformity or lesions, dentition normal. Neck:  Supple; no masses Lungs:  Clear throughout to auscultation.   No wheezes, crackles, or rhonchi. No acute distress. Heart:  Regular rate and rhythm; no murmurs, clicks, rubs,  or gallops. Abdomen:  Soft, nontender and nondistended. No masses, hepatosplenomegaly or hernias noted. Normal bowel sounds, without guarding, and without rebound.   Rectal: ***   Msk:  Symmetrical without gross deformities. Normal posture. Extremities:  Without clubbing or edema. Neurologic:  Alert and  oriented x4. Skin:  Intact without significant lesions or rashes. Psych:  Alert and cooperative. Normal mood and affect.  Intake/Output from previous day: 12/05 0701 - 12/06 0700 In: 100 [IV Piggyback:100] Out: 1000 [Urine:1000] Intake/Output this shift: No intake/output data recorded.  '@WEIGHTS'$ @  Labs/Studies   Recent  Labs Recent Labs    02/27/22 1838 02/28/22 0521  WBC 3.6* 3.5*  HGB 11.6* 10.9*  HCT 36.0* 33.3*  PLT 68* 67*   BMET Recent Labs    02/27/22 1838 02/28/22 0521  NA 135 137  K 4.2 3.4*  CL 101 102  CO2 25 28  GLUCOSE 168* 108*  BUN 13 12  CREATININE 1.30* 1.35*  CALCIUM 8.6* 8.6*   LFT Recent Labs    02/27/22 1838 02/28/22 0521  PROT 6.6 6.5  ALBUMIN 2.9* 3.0*  AST 53* 47*  ALT 20 19  ALKPHOS 96 91  BILITOT 3.7* 3.7*   PT/INR No results for input(s): "LABPROT", "INR" in the last 72 hours. Hepatitis Panel No results for input(s): "HEPBSAG", "HCVAB", "HEPAIGM", "HEPBIGM" in the last 72 hours. C-Diff No results for input(s): "CDIFFTOX" in the last 72 hours.  Radiology/Studies [place] ASCITES (ABDOMEN LIMITED)  Result Date: 02/28/2022 CLINICAL DATA:  Ascites, history CHF, chronic kidney disease, diabetes mellitus, hypertension, cirrhosis due to NASH EXAM: LIMITED ABDOMEN ULTRASOUND FOR ASCITES TECHNIQUE: Limited ultrasound survey for ascites was performed in all four abdominal quadrants. COMPARISON:  01/23/2022 FINDINGS: Trace ascites identified. Volume of ascites is insufficient for paracentesis. IMPRESSION: Insufficient ascites for paracentesis. Electronically Signed   By: Lavonia Dana M.D.   On: 02/28/2022 08:52   DG Chest Port 1 View  Result Date: 02/27/2022 CLINICAL  DATA:  Shortness of breath.  Abdominal distension. EXAM: PORTABLE CHEST 1 VIEW COMPARISON:  01/22/2022 FINDINGS: Heart size and mediastinal contours are unremarkable. Lungs are hypoinflated. There is no pleural fluid or interstitial edema. No airspace disease. Visualized osseous structures appear intact. IMPRESSION: Low lung volumes. No acute findings. Electronically Signed   By: Kerby Moors M.D.   On: 02/27/2022 18:51     Assessment   Daniel Crawford is a 66 y.o. year old male    Plan / Recommendations   ***      02/28/2022, 9:51 AM  Annitta Needs, PhD, ANP-BC [facility]

## 2022-02-28 NOTE — Hospital Course (Signed)
66 y.o. male with medical history significant of type 2 diabetes mellitus, hypertension, hypothyroidism, CHF, cirrhosis due to NASH and thrombocytopenia who presents to the emergency department due to increased leg swelling and abdominal distention which worsened within the last 3 days.  He complained of leg tightening due to increased fluid in legs, he also complained of abdomen becoming harder due to fluid accumulation, this is associated with some shortness of breath on mild exertion. He was recently admitted from 10/30 to 11/2 due to NASH/cirrhosis decompensated with anasarca in the setting of CHF and chronic kidney disease.  He was treated with IV Lasix and oral Aldactone and outpatient IR consult for possible TIPS.  He met with IR but they felt that he was not a good candidate for TIPS due to his pulmonary hypertension.     ED Course:  In the emergency department, BP was 150/97 and other vital signs were within normal range.  Workup in the ED showed pancytopenia (WBC 3.6, hemoglobin 11.6, hematocrit 36.0, platelets 68).  Ammonia 34, BNP 34.  Influenza A, B, SARS coronavirus 2 was negative.  Chest x-ray showed low lung volumes.  No acute findings.  IV Lasix 40 mg x 1 was given.  Hospitalist was asked to admit patient for further evaluation and management.

## 2022-02-28 NOTE — TOC Progression Note (Signed)
Transition of Care [facility]) - Progression Note    Patient Details  Name: Daniel Crawford MRN: [id number] Date of Birth: [birth year]  Transition of Care [facility]) CM/SW Contact  Salome Arnt, [place] Phone Number: 02/28/2022, 10:54 AM  Clinical Narrative:   Transition of Care [facility]) Screening Note   Patient Details  Name: Daniel Crawford Date of Birth: 1956-01-30   Transition of Care [facility]) CM/SW Contact:    Salome Arnt, [place] Phone Number: 02/28/2022, 10:54 AM    Transition of Care Department [facility]) has reviewed patient and no TOC needs have been identified at this time. We will continue to monitor patient advancement through interdisciplinary progression rounds. If new patient transition needs arise, please place a TOC consult.         Barriers to Discharge: Continued Medical Work up  Expected Discharge Plan and Services                                                 Social Determinants of Health (SDOH) Interventions    Readmission Risk Interventions     No data to display

## 2022-02-28 NOTE — Progress Notes (Signed)
PROGRESS NOTE   Daniel Crawford  YOV:[id number] DOB: 1956-03-17 DOA: 02/27/2022 PCP: [name], MD   Chief Complaint  Patient presents with   Shortness of Breath   Level of care: Telemetry  Brief Admission History:  66 y.o. male with medical history significant of type 2 diabetes mellitus, hypertension, hypothyroidism, CHF, cirrhosis due to NASH and thrombocytopenia who presents to the emergency department due to increased leg swelling and abdominal distention which worsened within the last 3 days.  He complained of leg tightening due to increased fluid in legs, he also complained of abdomen becoming harder due to fluid accumulation, this is associated with some shortness of breath on mild exertion. He was recently admitted from 10/30 to 11/2 due to NASH/cirrhosis decompensated with anasarca in the setting of CHF and chronic kidney disease.  He was treated with IV Lasix and oral Aldactone and outpatient IR consult for possible TIPS.  He met with IR but they felt that he was not a good candidate for TIPS due to his pulmonary hypertension.     ED Course:  In the emergency department, BP was 150/97 and other vital signs were within normal range.  Workup in the ED showed pancytopenia (WBC 3.6, hemoglobin 11.6, hematocrit 36.0, platelets 68).  Ammonia 34, BNP 34.  Influenza A, B, SARS coronavirus 2 was negative.  Chest x-ray showed low lung volumes.  No acute findings.  IV Lasix 40 mg x 1 was given.  Hospitalist was asked to admit patient for further evaluation and management.   Assessment and Plan: NASH/cirrhosis decompensated with anasarca Patient presents with distended abdomen (?  Ascites) Patient was treated with IV Lasix 40 mg x 1, will check continue with same at this time and add home Aldactone  Continue rifaximin Ultrasound guided paracentesis was ordered but per radiology [place] there was not enough fluid present for a paracentesis and procedure was discontinued   Chronic  thrombocytopenia Platelets 68, continue to monitor platelet levels   Iron deficiency anemia Continue ferrous sulfate    Hypoalbuminemia secondary to moderate protein calorie malnutrition Albumin 2.9, protein supplement to be provided   GERD/abdominal pain Continue Protonix Continue oxycodone as needed   Acquired hypothyroidism Continue Synthroid   Essential hypertension Continue Coreg, spironolactone, Lasix   Depression Continue Zoloft  DVT prophylaxis:  Code Status:  Family Communication:  Disposition: Status is: Inpatient Remains inpatient appropriate because: IV treatment required for diuresis    Consultants:  GI service  Procedures:  Paracentesis cancelled by [place] radiology 12/6  Antimicrobials:    Subjective: Pt has been urinating a lot since being started on IV furosemide   Objective: Vitals:   02/28/22 0930 02/28/22 1000 02/28/22 1030 02/28/22 1100  BP: 136/83 138/80 126/82 128/79  Pulse: 80 81 70 69  Resp:      Temp:      TempSrc:      SpO2: 93% 94% 94% 95%    Intake/Output Summary (Last 24 hours) at 02/28/2022 1227 Last data filed at 02/28/2022 0325 Gross per 24 hour  Intake 100 ml  Output 1000 ml  Net -900 ml   There were no vitals filed for this visit. Examination:  General exam: appears massively volume overloaded Appears calm and comfortable  Respiratory system: Clear to auscultation. Respiratory effort normal. Cardiovascular system: normal S1 & S2 heard. No JVD, murmurs, rubs, gallops or clicks. 2+ bilateral lower extremity and pedal edema. Gastrointestinal system: Abdomen is nondistended, soft and nontender. No organomegaly or masses felt. Normal bowel sounds heard.  Central nervous system: Alert and oriented. No focal neurological deficits. Extremities: Symmetric 5 x 5 power. Skin: No rashes, lesions or ulcers. Psychiatry: Judgement and insight appear normal. Mood & affect appropriate.   Data Reviewed: I have personally reviewed following  labs and imaging studies  CBC: Recent Labs  Lab 02/27/22 1838 02/28/22 0521  WBC 3.6* 3.5*  HGB 11.6* 10.9*  HCT 36.0* 33.3*  MCV 89.8 89.3  PLT 68* 67*    Basic Metabolic Panel: Recent Labs  Lab 02/27/22 1838 02/28/22 0521  NA 135 137  K 4.2 3.4*  CL 101 102  CO2 25 28  GLUCOSE 168* 108*  BUN 13 12  CREATININE 1.30* 1.35*  CALCIUM 8.6* 8.6*  MG  --  2.0  PHOS  --  3.4    CBG: No results for input(s): "GLUCAP" in the last 168 hours.  Recent Results (from the past 240 hour(s))  Resp Panel by RT-PCR (Flu A&B, Covid) Anterior Nasal Swab     Status: None   Collection Time: 02/27/22  6:48 PM   Specimen: Anterior Nasal Swab  Result Value Ref Range Status   SARS Coronavirus 2 by RT PCR NEGATIVE NEGATIVE Final    Comment: (NOTE) SARS-CoV-2 target nucleic acids are NOT DETECTED.  The SARS-CoV-2 RNA is generally detectable in upper respiratory specimens during the acute phase of infection. The lowest concentration of SARS-CoV-2 viral copies this assay can detect is 138 copies/mL. A negative result does not preclude SARS-Cov-2 infection and should not be used as the sole basis for treatment or other patient management decisions. A negative result Hermida occur with  improper specimen collection/handling, submission of specimen other than nasopharyngeal swab, presence of viral mutation(s) within the areas targeted by this assay, and inadequate number of viral copies(<138 copies/mL). A negative result must be combined with clinical observations, patient history, and epidemiological information. The expected result is Negative.  Fact Sheet for Patients:  EntrepreneurPulse.com.au  Fact Sheet for Healthcare Providers:  IncredibleEmployment.be  This test is no t yet approved or cleared by the [place] FDA and  has been authorized for detection and/or diagnosis of SARS-CoV-2 by FDA under an Emergency Use Authorization (EUA). This EUA  will remain  in effect (meaning this test can be used) for the duration of the COVID-19 declaration under Section 564(b)(1) of the Act, 21 U.S.C.section 360bbb-3(b)(1), unless the authorization is terminated  or revoked sooner.       Influenza A by PCR NEGATIVE NEGATIVE Final   Influenza B by PCR NEGATIVE NEGATIVE Final    Comment: (NOTE) The Xpert Xpress SARS-CoV-2/FLU/RSV plus assay is intended as an aid in the diagnosis of influenza from Nasopharyngeal swab specimens and should not be used as a sole basis for treatment. Nasal washings and aspirates are unacceptable for Xpert Xpress SARS-CoV-2/FLU/RSV testing.  Fact Sheet for Patients: EntrepreneurPulse.com.au  Fact Sheet for Healthcare Providers: IncredibleEmployment.be  This test is not yet approved or cleared by the [place] FDA and has been authorized for detection and/or diagnosis of SARS-CoV-2 by FDA under an Emergency Use Authorization (EUA). This EUA will remain in effect (meaning this test can be used) for the duration of the COVID-19 declaration under Section 564(b)(1) of the Act, 21 U.S.C. section 360bbb-3(b)(1), unless the authorization is terminated or revoked.  Performed at [facility], [address], [place], [place] [address]      Radiology Studies: [place] ASCITES (ABDOMEN LIMITED)  Result Date: 02/28/2022 CLINICAL DATA:  Ascites, history CHF, chronic kidney disease, diabetes  mellitus, hypertension, cirrhosis due to NASH EXAM: LIMITED ABDOMEN ULTRASOUND FOR ASCITES TECHNIQUE: Limited ultrasound survey for ascites was performed in all four abdominal quadrants. COMPARISON:  01/23/2022 FINDINGS: Trace ascites identified. Volume of ascites is insufficient for paracentesis. IMPRESSION: Insufficient ascites for paracentesis. Electronically Signed   By: Lavonia Dana M.D.   On: 02/28/2022 08:52   DG Chest Port 1 View  Result Date: 02/27/2022 CLINICAL DATA:  Shortness of breath.   Abdominal distension. EXAM: PORTABLE CHEST 1 VIEW COMPARISON:  01/22/2022 FINDINGS: Heart size and mediastinal contours are unremarkable. Lungs are hypoinflated. There is no pleural fluid or interstitial edema. No airspace disease. Visualized osseous structures appear intact. IMPRESSION: Low lung volumes. No acute findings. Electronically Signed   By: Kerby Moors M.D.   On: 02/27/2022 18:51    Scheduled Meds:  carvedilol  6.25 mg Oral BID WC   feeding supplement (GLUCERNA SHAKE)  237 mL Oral TID BM   ferrous sulfate  325 mg Oral QODAY   furosemide  40 mg Intravenous Q12H   levothyroxine  175 mcg Oral Q0600   pantoprazole  40 mg Oral Daily   potassium chloride SA  40 mEq Oral BID   rifaximin  550 mg Oral BID   sertraline  50 mg Oral QHS   spironolactone  50 mg Oral Daily   traZODone  50 mg Oral QHS   Continuous Infusions:   LOS: 1 day   Time spent: 36 mins  Phallon Haydu Wynetta Emery, MD How to contact the [facility] Attending or Consulting provider [place] or covering provider during after hours [place], for this patient?  Check the care team in [facility] and look for a) attending/consulting [facility] provider listed and b) the [facility] team listed Log into [email or web address] and use [place]'s universal password to access. If you do not have the password, please contact the hospital operator. Locate the [facility] provider you are looking for under [facility] and page to a number that you can be directly reached. If you still have difficulty reaching the provider, please page the [facility] (Director on Call) for the Hospitalists listed on amion for assistance.  02/28/2022, 12:27 PM   '

## 2022-03-01 DIAGNOSIS — K7581 Nonalcoholic steatohepatitis (NASH): Secondary | ICD-10-CM | POA: Diagnosis not present

## 2022-03-01 DIAGNOSIS — K219 Gastro-esophageal reflux disease without esophagitis: Secondary | ICD-10-CM | POA: Diagnosis not present

## 2022-03-01 DIAGNOSIS — R188 Other ascites: Secondary | ICD-10-CM | POA: Diagnosis not present

## 2022-03-01 DIAGNOSIS — R601 Generalized edema: Secondary | ICD-10-CM | POA: Diagnosis not present

## 2022-03-01 DIAGNOSIS — K7460 Unspecified cirrhosis of liver: Secondary | ICD-10-CM | POA: Diagnosis not present

## 2022-03-01 DIAGNOSIS — D509 Iron deficiency anemia, unspecified: Secondary | ICD-10-CM | POA: Diagnosis not present

## 2022-03-01 LAB — COMPREHENSIVE METABOLIC PANEL
ALT: 20 U/L (ref 0–44)
AST: 48 U/L — ABNORMAL HIGH (ref 15–41)
Albumin: 2.9 g/dL — ABNORMAL LOW (ref 3.5–5.0)
Alkaline Phosphatase: 84 U/L (ref 38–126)
Anion gap: 7 (ref 5–15)
BUN: 12 mg/dL (ref 8–23)
CO2: 30 mmol/L (ref 22–32)
Calcium: 8.4 mg/dL — ABNORMAL LOW (ref 8.9–10.3)
Chloride: 101 mmol/L (ref 98–111)
Creatinine, Ser: 1.37 mg/dL — ABNORMAL HIGH (ref 0.61–1.24)
GFR, Estimated: 58 mL/min — ABNORMAL LOW (ref >60)
Glucose, Bld: 106 mg/dL — ABNORMAL HIGH (ref 70–99)
Potassium: 3.3 mmol/L — ABNORMAL LOW (ref 3.5–5.1)
Sodium: 138 mmol/L (ref 135–145)
Total Bilirubin: 4.1 mg/dL — ABNORMAL HIGH (ref 0.3–1.2)
Total Protein: 6.2 g/dL — ABNORMAL LOW (ref 6.5–8.1)

## 2022-03-01 LAB — CBC
HCT: 32.9 % — ABNORMAL LOW (ref 39.0–52.0)
Hemoglobin: 10.6 g/dL — ABNORMAL LOW (ref 13.0–17.0)
MCH: 28.8 pg (ref 26.0–34.0)
MCHC: 32.2 g/dL (ref 30.0–36.0)
MCV: 89.4 fL (ref 80.0–100.0)
Platelets: 65 10*3/uL — ABNORMAL LOW (ref 150–400)
RBC: 3.68 MIL/uL — ABNORMAL LOW (ref 4.22–5.81)
RDW: 17.7 % — ABNORMAL HIGH (ref 11.5–15.5)
WBC: 3.1 10*3/uL — ABNORMAL LOW (ref 4.0–10.5)
nRBC: 0 % (ref 0.0–0.2)

## 2022-03-01 LAB — MAGNESIUM: Magnesium: 1.9 mg/dL (ref 1.7–2.4)

## 2022-03-01 MED ORDER — SPIRONOLACTONE 25 MG PO TABS
100.0000 mg | ORAL_TABLET | Freq: Every day | ORAL | Status: DC
  Administered 2022-03-02 – 2022-03-03 (×2): 100 mg via ORAL
  Filled 2022-03-01 (×3): qty 4

## 2022-03-01 MED ORDER — ALBUMIN HUMAN 25 % IV SOLN
50.0000 g | Freq: Three times a day (TID) | INTRAVENOUS | Status: AC
  Administered 2022-03-01 – 2022-03-03 (×6): 50 g via INTRAVENOUS
  Filled 2022-03-01 (×6): qty 200

## 2022-03-01 MED ORDER — SPIRONOLACTONE 25 MG PO TABS
50.0000 mg | ORAL_TABLET | Freq: Once | ORAL | Status: AC
  Administered 2022-03-01: 50 mg via ORAL
  Filled 2022-03-01: qty 2

## 2022-03-01 NOTE — Progress Notes (Signed)
Gastroenterology Progress Note   Referring Provider: No ref. provider found Primary Care Physician:  [name], MD Primary Gastroenterologist:  Dr. Jenetta Downer  Patient ID: Daniel Crawford [id number]; [birth year]   Subjective:    Thinks his lower extremity edema is slightly improved. Denies abdominal pain. No BM today. No melena, brbpr. States he is frustrated with edema. Takes his prescribed diuretics. States he is watching his salt intake. Knows how to follow 2 gram sodium diet.   Objective:   Vital signs in last 24 hours: Temp:  [97.5 F (36.4 C)-98.3 F (36.8 C)] 97.5 F (36.4 C) (12/07 1340) Pulse Rate:  [64-75] 67 (12/07 1340) Resp:  [12-20] 16 (12/07 1340) BP: (120-151)/(53-91) 144/91 (12/07 1340) SpO2:  [94 %-99 %] 99 % (12/07 1340) Last BM Date : 02/28/22 General:   Alert,  Well-developed, well-nourished, pleasant and cooperative in NAD Head:  Normocephalic and atraumatic. Eyes:  Sclera clear, no icterus.  Abdomen:  Soft, nontender and nondistended. Obese. Normal bowel sounds, without guarding, and without rebound.   Extremities:  chronic edematous changes in lower extremities. 1-2+pitting edema to above the knees, 3+ above ankles. Neurologic:  Alert and  oriented x4;  grossly normal neurologically. Psych:  Alert and cooperative. Normal mood and affect.  Intake/Output from previous day: 12/06 0701 - 12/07 0700 In: 240 [P.O.:240] Out: 2600 [Urine:2600] Intake/Output this shift: No intake/output data recorded.  Lab Results: CBC Recent Labs    02/27/22 1838 02/28/22 0521 03/01/22 0612  WBC 3.6* 3.5* 3.1*  HGB 11.6* 10.9* 10.6*  HCT 36.0* 33.3* 32.9*  MCV 89.8 89.3 89.4  PLT 68* 67* 65*   BMET Recent Labs    02/27/22 1838 02/28/22 0521 03/01/22 0612  NA 135 137 138  K 4.2 3.4* 3.3*  CL 101 102 101  CO2 '25 28 30  '$ GLUCOSE 168* 108* 106*  BUN '13 12 12  '$ CREATININE 1.30* 1.35* 1.37*  CALCIUM 8.6* 8.6* 8.4*   LFTs Recent Labs    02/27/22 1838  02/28/22 0521 03/01/22 0612  BILITOT 3.7* 3.7* 4.1*  ALKPHOS 96 91 84  AST 53* 47* 48*  ALT '20 19 20  '$ PROT 6.6 6.5 6.2*  ALBUMIN 2.9* 3.0* 2.9*   No results for input(s): "LIPASE" in the last 72 hours. PT/INR Recent Labs    02/28/22 1401  LABPROT 19.5*  INR 1.7*         Imaging Studies: [place] ASCITES (ABDOMEN LIMITED)  Result Date: 02/28/2022 CLINICAL DATA:  Ascites, history CHF, chronic kidney disease, diabetes mellitus, hypertension, cirrhosis due to NASH EXAM: LIMITED ABDOMEN ULTRASOUND FOR ASCITES TECHNIQUE: Limited ultrasound survey for ascites was performed in all four abdominal quadrants. COMPARISON:  01/23/2022 FINDINGS: Trace ascites identified. Volume of ascites is insufficient for paracentesis. IMPRESSION: Insufficient ascites for paracentesis. Electronically Signed   By: Lavonia Dana M.D.   On: 02/28/2022 08:52   DG Chest Port 1 View  Result Date: 02/27/2022 CLINICAL DATA:  Shortness of breath.  Abdominal distension. EXAM: PORTABLE CHEST 1 VIEW COMPARISON:  01/22/2022 FINDINGS: Heart size and mediastinal contours are unremarkable. Lungs are hypoinflated. There is no pleural fluid or interstitial edema. No airspace disease. Visualized osseous structures appear intact. IMPRESSION: Low lung volumes. No acute findings. Electronically Signed   By: Kerby Moors M.D.   On: 02/27/2022 18:51   IR Radiologist Eval & Mgmt  Result Date: 02/19/2022 EXAM: NEW PATIENT OFFICE VISIT CHIEF COMPLAINT: See Epic note. HISTORY OF PRESENT ILLNESS: See Epic note. REVIEW OF SYSTEMS: See Epic note. PHYSICAL  EXAMINATION: See Epic note. ASSESSMENT AND PLAN: See Epic note. Ruthann Cancer, MD [facility] Radiology Electronically Signed   By: Ruthann Cancer M.D.   On: 02/19/2022 15:05  [2 weeks]  Assessment:   Daniel Crawford is a 66 y.o. year old male  with medical history of type 2 diabetes, hypertension, hypothyroidism, CHF, cirrhosis due to NASH, and  thrombocytopenia presents for SOB and fluid retention in setting of decompensated cirrhosis. Recent admission for similar presentation in October/November.   Decompensated Cirrhosis with fluid overload: fluid retention for the past 2-3 weeks, previously evaluated for TIPS and not a candidate due to other multimorbidities. Reports compliance with medications at home. On Lasix '100mg'$  and spironolactone '50mg'$  daily as outpatient, as he states he most of the time only takes '40mg'$  lasix in the evening. Started on IV Lasix '40mg'$  BID and spironolactone '50mg'$  this admission, has had 2.6L of urine output the last 24 hours. [place] abd this admission with ascites not sufficient enough for paracentesis. Albumin was 2.9, He received 25g of Albumin. Will need diuresis with close monitoring on renal function as creatinine is 1.30-->1.35-->1.37.    Denies confusion, does well on xifaxan '550mg'$  BID as outpatient. Lactulose caused fecal incontinence previously. EGD in 2021 w/o varices. Ammonia 34 on admission. No signs of HE. MELD Na 20. INR 1.7, AST 47, T bili 3.7. previously evaluated by [facility] for liver transplant, though not a candidate due to his BMI, multi-morbidities. unsure of true euvolemic weight as he tends to remain somewhat fluid overloaded.    Colon polyps: colonoscopy in April 2023 with tubular adenomas, polyp in cecum not removed, recommended repeat colonoscopy which has been postponed due to continued fluid overload.       Plan:   Two gram sodium diet.  IV albumin 50g TID for six doses.  Continue IV lasix. Increase spironolactone to '100mg'$  daily. History of mastodynia previously. Monitor closely.  Monitor renal function.    LOS: 2 days   Laureen Ochs. Bernarda Caffey [facility] [phone] 12/7/20231:55 PM

## 2022-03-01 NOTE — Progress Notes (Signed)
PROGRESS NOTE   Daniel Crawford  ZMO:[id number] DOB: 1955/12/02 DOA: 02/27/2022 PCP: [name], MD   Chief Complaint  Patient presents with   Shortness of Breath   Level of care: Telemetry  Brief Admission History:  66 y.o. male with medical history significant of type 2 diabetes mellitus, hypertension, hypothyroidism, CHF, cirrhosis due to NASH and thrombocytopenia who presents to the emergency department due to increased leg swelling and abdominal distention which worsened within the last 3 days.  He complained of leg tightening due to increased fluid in legs, he also complained of abdomen becoming harder due to fluid accumulation, this is associated with some shortness of breath on mild exertion. He was recently admitted from 10/30 to 11/2 due to NASH/cirrhosis decompensated with anasarca in the setting of CHF and chronic kidney disease.  He was treated with IV Lasix and oral Aldactone and outpatient IR consult for possible TIPS.  He met with IR but they felt that he was not a good candidate for TIPS due to his pulmonary hypertension.     ED Course:  In the emergency department, BP was 150/97 and other vital signs were within normal range.  Workup in the ED showed pancytopenia (WBC 3.6, hemoglobin 11.6, hematocrit 36.0, platelets 68).  Ammonia 34, BNP 34.  Influenza A, B, SARS coronavirus 2 was negative.  Chest x-ray showed low lung volumes.  No acute findings.  IV Lasix 40 mg x 1 was given.  Hospitalist was asked to admit patient for further evaluation and management.   Assessment and Plan: NASH/cirrhosis decompensated with anasarca Patient presents with distended abdomen (?  Ascites) Patient was treated with IV Lasix 40 mg x 1, will check continue with same at this time and add home Aldactone  Continue rifaximin Ultrasound guided paracentesis was ordered but per radiology [place] there was not enough fluid present for a paracentesis and procedure was discontinued   Chronic  thrombocytopenia Platelets 65, continue to monitor platelet levels   Iron deficiency anemia Continue ferrous sulfate    Hypoalbuminemia secondary to moderate protein calorie malnutrition Albumin 2.9, protein supplement to be provided   GERD/abdominal pain Continue Protonix Continue oxycodone as needed   Acquired hypothyroidism Continue Synthroid   Essential hypertension Continue Coreg, spironolactone, Lasix   Depression Continue Zoloft  DVT prophylaxis: scds Code Status: full  Family Communication: updated pt with plan of care at bedside  Disposition: Status is: Inpatient Remains inpatient appropriate because: IV treatment required for diuresis    Consultants:  GI service  Procedures:  Paracentesis cancelled by [place] radiology 12/6  Antimicrobials:    Subjective: Pt continues urinating frequently on lasix.     Objective: Vitals:   02/28/22 2108 03/01/22 0010 03/01/22 0438 03/01/22 1340  BP: (!) 125/58 (!) 148/75 (!) 151/77 (!) 144/91  Pulse: 65 75 71 67  Resp: _0 Temp: 98.3 F (36.8 C) 98.1 F (36.7 C) 98.1 F (36.7 C) (!) 97.5 F (36.4 C)  TempSrc:    Oral  SpO2: 97% 97% 97% 99%    Intake/Output Summary (Last 24 hours) at 03/01/2022 1724 Last data filed at 03/01/2022 0636 Gross per 24 hour  Intake 240 ml  Output 2600 ml  Net -2360 ml   There were no vitals filed for this visit. Examination:  General exam: appears massively volume overloaded Appears calm and comfortable  Respiratory system: Clear to auscultation. Respiratory effort normal. Cardiovascular system: normal S1 & S2 heard. No JVD, murmurs, rubs, gallops or clicks. 2+ bilateral  lower extremity and pedal edema. Gastrointestinal system: Abdomen is nondistended, soft and nontender. No organomegaly or masses felt. Normal bowel sounds heard. Central nervous system: Alert and oriented. No focal neurological deficits. Extremities: Symmetric 5 x 5 power. Skin: No rashes, lesions or  ulcers. Psychiatry: Judgement and insight appear normal. Mood & affect appropriate.   Data Reviewed: I have personally reviewed following labs and imaging studies  CBC: Recent Labs  Lab 02/27/22 1838 02/28/22 0521 03/01/22 0612  WBC 3.6* 3.5* 3.1*  HGB 11.6* 10.9* 10.6*  HCT 36.0* 33.3* 32.9*  MCV 89.8 89.3 89.4  PLT 68* 67* 65*    Basic Metabolic Panel: Recent Labs  Lab 02/27/22 1838 02/28/22 0521 03/01/22 0612  NA 135 137 138  K 4.2 3.4* 3.3*  CL 101 102 101  CO2 _0 GLUCOSE 168* 108* 106*  BUN _1 CREATININE 1.30* 1.35* 1.37*  CALCIUM 8.6* 8.6* 8.4*  MG  --  2.0 1.9  PHOS  --  3.4  --     CBG: Recent Labs  Lab 02/28/22 2033  GLUCAP 133*    Recent Results (from the past 240 hour(s))  Resp Panel by RT-PCR (Flu A&B, Covid) Anterior Nasal Swab     Status: None   Collection Time: 02/27/22  6:48 PM   Specimen: Anterior Nasal Swab  Result Value Ref Range Status   SARS Coronavirus 2 by RT PCR NEGATIVE NEGATIVE Final    Comment: (NOTE) SARS-CoV-2 target nucleic acids are NOT DETECTED.  The SARS-CoV-2 RNA is generally detectable in upper respiratory specimens during the acute phase of infection. The lowest concentration of SARS-CoV-2 viral copies this assay can detect is 138 copies/mL. A negative result does not preclude SARS-Cov-2 infection and should not be used as the sole basis for treatment or other patient management decisions. A negative result [place] occur with  improper specimen collection/handling, submission of specimen other than nasopharyngeal swab, presence of viral mutation(s) within the areas targeted by this assay, and inadequate number of viral copies(<138 copies/mL). A negative result must be combined with clinical observations, patient history, and epidemiological information. The expected result is Negative.  Fact Sheet for Patients:  EntrepreneurPulse.com.au  Fact Sheet for Healthcare Providers:   IncredibleEmployment.be  This test is no t yet approved or cleared by the [place] FDA and  has been authorized for detection and/or diagnosis of SARS-CoV-2 by FDA under an Emergency Use Authorization (EUA). This EUA will remain  in effect (meaning this test can be used) for the duration of the COVID-19 declaration under Section 564(b)(1) of the Act, 21 U.S.C.section 360bbb-3(b)(1), unless the authorization is terminated  or revoked sooner.       Influenza A by PCR NEGATIVE NEGATIVE Final   Influenza B by PCR NEGATIVE NEGATIVE Final    Comment: (NOTE) The Xpert Xpress SARS-CoV-2/FLU/RSV plus assay is intended as an aid in the diagnosis of influenza from Nasopharyngeal swab specimens and should not be used as a sole basis for treatment. Nasal washings and aspirates are unacceptable for Xpert Xpress SARS-CoV-2/FLU/RSV testing.  Fact Sheet for Patients: EntrepreneurPulse.com.au  Fact Sheet for Healthcare Providers: IncredibleEmployment.be  This test is not yet approved or cleared by the [place] FDA and has been authorized for detection and/or diagnosis of SARS-CoV-2 by FDA under an Emergency Use Authorization (EUA). This EUA will remain in effect (meaning this test can be used) for the duration of the COVID-19 declaration under Section 564(b)(1) of the Act, 21 U.S.C. section 360bbb-3(b)(1),  unless the authorization is terminated or revoked.  Performed at [facility], [address] [place],  [address]      Radiology Studies: [place] ASCITES (ABDOMEN LIMITED)  Result Date: 02/28/2022 CLINICAL DATA:  Ascites, history CHF, chronic kidney disease, diabetes mellitus, hypertension, cirrhosis due to NASH EXAM: LIMITED ABDOMEN ULTRASOUND FOR ASCITES TECHNIQUE: Limited ultrasound survey for ascites was performed in all four abdominal quadrants. COMPARISON:  01/23/2022 FINDINGS: Trace ascites identified. Volume of  ascites is insufficient for paracentesis. IMPRESSION: Insufficient ascites for paracentesis. Electronically Signed   By: Lavonia Dana M.D.   On: 02/28/2022 08:52   DG Chest Port 1 View  Result Date: 02/27/2022 CLINICAL DATA:  Shortness of breath.  Abdominal distension. EXAM: PORTABLE CHEST 1 VIEW COMPARISON:  01/22/2022 FINDINGS: Heart size and mediastinal contours are unremarkable. Lungs are hypoinflated. There is no pleural fluid or interstitial edema. No airspace disease. Visualized osseous structures appear intact. IMPRESSION: Low lung volumes. No acute findings. Electronically Signed   By: Kerby Moors M.D.   On: 02/27/2022 18:51    Scheduled Meds:  carvedilol  6.25 mg Oral BID WC   feeding supplement (GLUCERNA SHAKE)  237 mL Oral TID BM   ferrous sulfate  325 mg Oral QODAY   furosemide  40 mg Intravenous Q12H   levothyroxine  175 mcg Oral Q0600   pantoprazole  40 mg Oral Daily   potassium chloride SA  40 mEq Oral BID   rifaximin  550 mg Oral BID   sertraline  50 mg Oral QHS   [START ON 03/02/2022] spironolactone  100 mg Oral Daily   traZODone  50 mg Oral QHS   Continuous Infusions:  albumin human 50 g (03/01/22 1700)     LOS: 2 days   Time spent: 35 mins  Donnie Gedeon Wynetta Emery, MD How to contact the [facility] Attending or Consulting provider [place] or covering provider during after hours [place], for this patient?  Check the care team in [facility] and look for a) attending/consulting TRH provider listed and b) the [facility] team listed Log into [email or web address] and use [place]'s universal password to access. If you do not have the password, please contact the hospital operator. Locate the [facility] provider you are looking for under [facility] and page to a number that you can be directly reached. If you still have difficulty reaching the provider, please page the [facility] (Director on Call) for the Hospitalists listed on amion for assistance.  03/01/2022, 5:24 PM   '

## 2022-03-02 DIAGNOSIS — R188 Other ascites: Secondary | ICD-10-CM | POA: Diagnosis not present

## 2022-03-02 DIAGNOSIS — K7460 Unspecified cirrhosis of liver: Secondary | ICD-10-CM | POA: Diagnosis not present

## 2022-03-02 DIAGNOSIS — R601 Generalized edema: Secondary | ICD-10-CM | POA: Diagnosis not present

## 2022-03-02 LAB — COMPREHENSIVE METABOLIC PANEL
ALT: 19 U/L (ref 0–44)
AST: 49 U/L — ABNORMAL HIGH (ref 15–41)
Albumin: 3.3 g/dL — ABNORMAL LOW (ref 3.5–5.0)
Alkaline Phosphatase: 81 U/L (ref 38–126)
Anion gap: 8 (ref 5–15)
BUN: 12 mg/dL (ref 8–23)
CO2: 30 mmol/L (ref 22–32)
Calcium: 8.6 mg/dL — ABNORMAL LOW (ref 8.9–10.3)
Chloride: 101 mmol/L (ref 98–111)
Creatinine, Ser: 1.41 mg/dL — ABNORMAL HIGH (ref 0.61–1.24)
GFR, Estimated: 56 mL/min — ABNORMAL LOW (ref >60)
Glucose, Bld: 103 mg/dL — ABNORMAL HIGH (ref 70–99)
Potassium: 3.7 mmol/L (ref 3.5–5.1)
Sodium: 139 mmol/L (ref 135–145)
Total Bilirubin: 3.7 mg/dL — ABNORMAL HIGH (ref 0.3–1.2)
Total Protein: 6.4 g/dL — ABNORMAL LOW (ref 6.5–8.1)

## 2022-03-02 LAB — GLUCOSE, CAPILLARY: Glucose-Capillary: 101 mg/dL — ABNORMAL HIGH (ref 70–99)

## 2022-03-02 LAB — CBC
HCT: 33.4 % — ABNORMAL LOW (ref 39.0–52.0)
Hemoglobin: 11 g/dL — ABNORMAL LOW (ref 13.0–17.0)
MCH: 29.5 pg (ref 26.0–34.0)
MCHC: 32.9 g/dL (ref 30.0–36.0)
MCV: 89.5 fL (ref 80.0–100.0)
Platelets: 59 10*3/uL — ABNORMAL LOW (ref 150–400)
RBC: 3.73 MIL/uL — ABNORMAL LOW (ref 4.22–5.81)
RDW: 17.7 % — ABNORMAL HIGH (ref 11.5–15.5)
WBC: 2.7 10*3/uL — ABNORMAL LOW (ref 4.0–10.5)
nRBC: 0 % (ref 0.0–0.2)

## 2022-03-02 LAB — MAGNESIUM: Magnesium: 2 mg/dL (ref 1.7–2.4)

## 2022-03-02 MED ORDER — PROMETHAZINE HCL 25 MG/ML IJ SOLN
INTRAMUSCULAR | Status: AC
  Filled 2022-03-02: qty 1

## 2022-03-02 MED ORDER — SODIUM CHLORIDE 0.9 % IV SOLN
12.5000 mg | Freq: Once | INTRAVENOUS | Status: AC
  Administered 2022-03-02: 12.5 mg via INTRAVENOUS
  Filled 2022-03-02: qty 0.5

## 2022-03-02 NOTE — Care Management Important Message (Signed)
Important Message  Patient Details  Name: Daniel Crawford MRN: [id number] Date of Birth: [birth year]   Medicare Important Message Given:  Yes     Tommy Medal 03/02/2022, 1:13 PM

## 2022-03-02 NOTE — Progress Notes (Signed)
PROGRESS NOTE   Daniel Crawford  JJO:[id number] DOB: November 25, 1955 DOA: 02/27/2022 PCP: [name], MD   Chief Complaint  Patient presents with   Shortness of Breath   Level of care: Telemetry  Brief Admission History:  66 y.o. male with medical history significant of type 2 diabetes mellitus, hypertension, hypothyroidism, CHF, cirrhosis due to NASH and thrombocytopenia who presents to the emergency department due to increased leg swelling and abdominal distention which worsened within the last 3 days.  He complained of leg tightening due to increased fluid in legs, he also complained of abdomen becoming harder due to fluid accumulation, this is associated with some shortness of breath on mild exertion. He was recently admitted from 10/30 to 11/2 due to NASH/cirrhosis decompensated with anasarca in the setting of CHF and chronic kidney disease.  He was treated with IV Lasix and oral Aldactone and outpatient IR consult for possible TIPS.  He met with IR but they felt that he was not a good candidate for TIPS due to his pulmonary hypertension.     ED Course:  In the emergency department, BP was 150/97 and other vital signs were within normal range.  Workup in the ED showed pancytopenia (WBC 3.6, hemoglobin 11.6, hematocrit 36.0, platelets 68).  Ammonia 34, BNP 34.  Influenza A, B, SARS coronavirus 2 was negative.  Chest x-ray showed low lung volumes.  No acute findings.  IV Lasix 40 mg x 1 was given.  Hospitalist was asked to admit patient for further evaluation and management.   Assessment and Plan: NASH/cirrhosis decompensated with anasarca Patient presents with distended abdomen but significant ascites ruled out Patient IV Lasix 40 mg BID; Aldactone 100 mg daily Continue rifaximin Ultrasound guided paracentesis was ordered but per radiology [place] there was not enough fluid present for a paracentesis and procedure was discontinued   Chronic thrombocytopenia Platelets 65, continue to monitor for  bleeding complications   Iron deficiency anemia Continue ferrous sulfate    Hypoalbuminemia secondary to moderate protein calorie malnutrition Albumin 2.9, protein supplement to be provided IV albumin ordered by GI service thru 12/9   GERD/abdominal pain Continue Protonix Continue oxycodone as needed   Acquired hypothyroidism Continue Synthroid   Essential hypertension Continue Coreg, spironolactone, Lasix   Depression Continue Zoloft  DVT prophylaxis: scds Code Status: full  Family Communication: updated pt with plan of care at bedside  Disposition: Status is: Inpatient Remains inpatient appropriate because: IV treatment required for diuresis    Consultants:  GI service  Procedures:  Paracentesis cancelled by [place] radiology 12/6  Antimicrobials:    Subjective: Pt reports his urinary frequency is slowing down.  He says he needs to get home by Sun to check on his dog.       Objective: Vitals:   03/01/22 1340 03/01/22 2006 03/02/22 0500 03/02/22 0532  BP: (!) 144/91 (!) 103/44  133/76  Pulse: 67 63  (!) 58  Resp: 16     Temp: (!) 97.5 F (36.4 C) 98.6 F (37 C)  97.7 F (36.5 C)  TempSrc: Oral     SpO2: 99% 96%  99%  Weight:   (!) 179.7 kg     Intake/Output Summary (Last 24 hours) at 03/02/2022 1013 Last data filed at 03/02/2022 0956 Gross per 24 hour  Intake 421.27 ml  Output 1000 ml  Net -578.73 ml   Filed Weights   03/02/22 0500  Weight: (!) 179.7 kg   Examination:  General exam: appears massively volume overloaded Appears calm and  comfortable  Respiratory system: Clear to auscultation. Respiratory effort normal. Cardiovascular system: normal S1 & S2 heard. No JVD, murmurs, rubs, gallops or clicks. 2+ bilateral lower extremity and pedal edema. Gastrointestinal system: Abdomen is nondistended, soft and nontender. No organomegaly or masses felt. Normal bowel sounds heard. Central nervous system: Alert and oriented. No focal neurological  deficits. Extremities: Symmetric 5 x 5 power. Skin: No rashes, lesions or ulcers. Psychiatry: Judgement and insight appear normal. Mood & affect appropriate.   Data Reviewed: I have personally reviewed following labs and imaging studies  CBC: Recent Labs  Lab 02/27/22 1838 02/28/22 0521 03/01/22 0612 03/02/22 0529  WBC 3.6* 3.5* 3.1* 2.7*  HGB 11.6* 10.9* 10.6* 11.0*  HCT 36.0* 33.3* 32.9* 33.4*  MCV 89.8 89.3 89.4 89.5  PLT 68* 67* 65* 59*    Basic Metabolic Panel: Recent Labs  Lab 02/27/22 1838 02/28/22 0521 03/01/22 0612 03/02/22 0529  NA 135 137 138 139  K 4.2 3.4* 3.3* 3.7  CL 101 102 101 101  CO2 _0 GLUCOSE 168* 108* 106* 103*  BUN _1 CREATININE 1.30* 1.35* 1.37* 1.41*  CALCIUM 8.6* 8.6* 8.4* 8.6*  MG  --  2.0 1.9 2.0  PHOS  --  3.4  --   --     CBG: Recent Labs  Lab 02/28/22 2033  GLUCAP 133*    Recent Results (from the past 240 hour(s))  Resp Panel by RT-PCR (Flu A&B, Covid) Anterior Nasal Swab     Status: None   Collection Time: 02/27/22  6:48 PM   Specimen: Anterior Nasal Swab  Result Value Ref Range Status   SARS Coronavirus 2 by RT PCR NEGATIVE NEGATIVE Final    Comment: (NOTE) SARS-CoV-2 target nucleic acids are NOT DETECTED.  The SARS-CoV-2 RNA is generally detectable in upper respiratory specimens during the acute phase of infection. The lowest concentration of SARS-CoV-2 viral copies this assay can detect is 138 copies/mL. A negative result does not preclude SARS-Cov-2 infection and should not be used as the sole basis for treatment or other patient management decisions. A negative result [place] occur with  improper specimen collection/handling, submission of specimen other than nasopharyngeal swab, presence of viral mutation(s) within the areas targeted by this assay, and inadequate number of viral copies(<138 copies/mL). A negative result must be combined with clinical observations, patient history, and  epidemiological information. The expected result is Negative.  Fact Sheet for Patients:  EntrepreneurPulse.com.au  Fact Sheet for Healthcare Providers:  IncredibleEmployment.be  This test is no t yet approved or cleared by the [place] FDA and  has been authorized for detection and/or diagnosis of SARS-CoV-2 by FDA under an Emergency Use Authorization (EUA). This EUA will remain  in effect (meaning this test can be used) for the duration of the COVID-19 declaration under Section 564(b)(1) of the Act, 21 U.S.C.section 360bbb-3(b)(1), unless the authorization is terminated  or revoked sooner.       Influenza A by PCR NEGATIVE NEGATIVE Final   Influenza B by PCR NEGATIVE NEGATIVE Final    Comment: (NOTE) The Xpert Xpress SARS-CoV-2/FLU/RSV plus assay is intended as an aid in the diagnosis of influenza from Nasopharyngeal swab specimens and should not be used as a sole basis for treatment. Nasal washings and aspirates are unacceptable for Xpert Xpress SARS-CoV-2/FLU/RSV testing.  Fact Sheet for Patients: EntrepreneurPulse.com.au  Fact Sheet for Healthcare Providers: IncredibleEmployment.be  This test is not yet approved or cleared by the [place] FDA  and has been authorized for detection and/or diagnosis of SARS-CoV-2 by FDA under an Emergency Use Authorization (EUA). This EUA will remain in effect (meaning this test can be used) for the duration of the COVID-19 declaration under Section 564(b)(1) of the Act, 21 U.S.C. section 360bbb-3(b)(1), unless the authorization is terminated or revoked.  Performed at [facility], [address] [place], [place] [address]      Radiology Studies: No results found.  Scheduled Meds:  carvedilol  6.25 mg Oral BID WC   feeding supplement (GLUCERNA SHAKE)  237 mL Oral TID BM   ferrous sulfate  325 mg Oral QODAY   furosemide  40 mg Intravenous Q12H    levothyroxine  175 mcg Oral Q0600   pantoprazole  40 mg Oral Daily   potassium chloride SA  40 mEq Oral BID   rifaximin  550 mg Oral BID   sertraline  50 mg Oral QHS   spironolactone  100 mg Oral Daily   traZODone  50 mg Oral QHS   Continuous Infusions:  albumin human 50 g (03/02/22 0923)     LOS: 3 days   Time spent: 35 mins  Lucero Ide Wynetta Emery, MD How to contact the [facility] Attending or Consulting provider [place] or covering provider during after hours [place], for this patient?  Check the care team in [facility] and look for a) attending/consulting TRH provider listed and b) the [facility] team listed Log into [email or web address] and use [place]'s universal password to access. If you do not have the password, please contact the hospital operator. Locate the [facility] provider you are looking for under [facility] and page to a number that you can be directly reached. If you still have difficulty reaching the provider, please page the [facility] (Director on Call) for the Hospitalists listed on amion for assistance.  03/02/2022, 10:13 AM   '

## 2022-03-02 NOTE — Progress Notes (Signed)
Gastroenterology Progress Note   Referring Provider: No ref. provider found Primary Care Physician:  [name], MD Primary Gastroenterologist: Harvel Quale, MD  Patient ID: Daniel Crawford; [id number]; 07-25-1955    Subjective   Patient unable to notice if there is any improvement in swelling today.  Denies any abdominal pain, nausea, vomiting, melena, BRBPR. + flatus.  Very small BMs today and yesterday evening.  Reportedly following decrease sodium diet at home, but does admit to occasional consumption of healthy choice meals if he does not feel like cooking.  Denies adding salt to his food.  States he typically takes 80 to 120 mg of Lasix daily at home, some of this is dependent on if he has to run errands or not, Busey take his dose a little later in the day than is recommended.  Typically does take at least 40 mg of Lasix in the evenings.  Does report he is anxious to get back home to his dog but does not want to keep coming back to the hospital.  Frustrated about her not being many options regarding his swelling.  Shortness of breath is somewhat improved since admission.   Objective   Vital signs in last 24 hours Temp:  [97.5 F (36.4 C)-98.6 F (37 C)] 97.7 F (36.5 C) (12/08 0532) Pulse Rate:  [58-67] 58 (12/08 0532) Resp:  [16] 16 (12/07 1340) BP: (103-144)/(44-91) 133/76 (12/08 0532) SpO2:  [96 %-99 %] 99 % (12/08 0532) Weight:  [179.7 kg] 179.7 kg (12/08 0500) Last BM Date : 03/01/22  Physical Exam  General:   Alert and oriented, pleasant Head:  Normocephalic and atraumatic. Eyes:  No icterus, sclera clear Heart:  S1, S2 present, no murmurs noted.  Lungs: Clear to auscultation bilaterally, without wheezing, rales, or rhonchi.  Abdomen:  Rounded. Bowel sounds present, soft, non-tender, non-distended. No HSM or hernias noted. No rebound or guarding. No masses appreciated  Msk:  Symmetrical without gross deformities. Normal posture. Extremities:  + 3 edema to  ankles, + 2 edema up to knees Neurologic:  Alert and  oriented x4;  grossly normal neurologically. Psych:  Alert and cooperative. Normal mood and affect.  Intake/Output from previous day: 12/07 0701 - 12/08 0700 In: 301.3 [P.O.:240; IV Piggyback:61.3] Out: 1000 [Urine:1000] Intake/Output this shift: Total I/O In: 120 [P.O.:120] Out: -   Lab Results  Recent Labs    02/28/22 0521 03/01/22 0612 03/02/22 0529  WBC 3.5* 3.1* 2.7*  HGB 10.9* 10.6* 11.0*  HCT 33.3* 32.9* 33.4*  PLT 67* 65* 59*   BMET Recent Labs    02/28/22 0521 03/01/22 0612 03/02/22 0529  NA 137 138 139  K 3.4* 3.3* 3.7  CL 102 101 101  CO2 '28 30 30  '$ GLUCOSE 108* 106* 103*  BUN '12 12 12  '$ CREATININE 1.35* 1.37* 1.41*  CALCIUM 8.6* 8.4* 8.6*   LFT Recent Labs    02/28/22 0521 03/01/22 0612 03/02/22 0529  PROT 6.5 6.2* 6.4*  ALBUMIN 3.0* 2.9* 3.3*  AST 47* 48* 49*  ALT '19 20 19  '$ ALKPHOS 91 84 81  BILITOT 3.7* 4.1* 3.7*   PT/INR Recent Labs    02/28/22 1401  LABPROT 19.5*  INR 1.7*   Hepatitis Panel No results for input(s): "HEPBSAG", "HCVAB", "HEPAIGM", "HEPBIGM" in the last 72 hours.   Studies/Results [place] ASCITES (ABDOMEN LIMITED)  Result Date: 02/28/2022 CLINICAL DATA:  Ascites, history CHF, chronic kidney disease, diabetes mellitus, hypertension, cirrhosis due to NASH EXAM: LIMITED ABDOMEN ULTRASOUND FOR ASCITES TECHNIQUE: Limited  ultrasound survey for ascites was performed in all four abdominal quadrants. COMPARISON:  01/23/2022 FINDINGS: Trace ascites identified. Volume of ascites is insufficient for paracentesis. IMPRESSION: Insufficient ascites for paracentesis. Electronically Signed   By: Lavonia Dana M.D.   On: 02/28/2022 08:52   DG Chest Port 1 View  Result Date: 02/27/2022 CLINICAL DATA:  Shortness of breath.  Abdominal distension. EXAM: PORTABLE CHEST 1 VIEW COMPARISON:  01/22/2022 FINDINGS: Heart size and mediastinal contours are unremarkable. Lungs are hypoinflated. There is  no pleural fluid or interstitial edema. No airspace disease. Visualized osseous structures appear intact. IMPRESSION: Low lung volumes. No acute findings. Electronically Signed   By: Kerby Moors M.D.   On: 02/27/2022 18:51   IR Radiologist Eval & Mgmt  Result Date: 02/19/2022 EXAM: NEW PATIENT OFFICE VISIT CHIEF COMPLAINT: See Epic note. HISTORY OF PRESENT ILLNESS: See Epic note. REVIEW OF SYSTEMS: See Epic note. PHYSICAL EXAMINATION: See Epic note. ASSESSMENT AND PLAN: See Epic note. Ruthann Cancer, MD [facility] Electronically Signed   By: Ruthann Cancer M.D.   On: 02/19/2022 15:05    Assessment  66 y.o. male with a history of type 2 diabetes, HTN, hypothyroidism, CHF, NASH cirrhosis with evidence of anemia who presented with shortness of breath and fluid retention in setting of decompensated cirrhosis.  Recent admission in [place] with similar presentation.  GI consulted for further evaluation management  Decompensated cirrhosis with anasarca: Increased fluid retention over the past 2-3 weeks.  Previously underwent evaluation for TIPS and deemed to not be a candidate due to multiple comorbidities.  Reported being compliant with Lasix 100 mg and spironolactone 50 mg daily, however stated he was taking 40 mg of Lasix in the evening.  Initially started on IV Lasix 40 mg twice daily spironolactone 50 mg this admission.  I&O's not updated for day shift yesterday, documented 1 L of urine output overnight.  Likely not accurate, however he appears to be net -4 L per chart.  Ultrasound abdomen this admission with ascites insufficient for paracentesis.  He has received 25 g of albumin this admission, improvement of albumin from 2.7 to 3.3 today.  Continues to need close monitoring of renal function with diuresis as he has continued to have slight increases in his creatinine 1.29--> 1.35--> 1.37--> 1.41.   Continues to deny any confusion,  melena, or BRBPR, nausea, or vomiting.  + flatus and has had a few small bowel movements.  Has done well Xifaxan 550 mg twice daily, lactulose not previously tolerated given significant fecal incontinence.  EGD in 2021 without varices.  Ammonia was 34 on admission.  MELD score 20 yesterday, previously evaluated by [facility] for liver transplant and felt to not be a candidate due to BMI and multiple comorbidities.  Colon polyps: Colonoscopy in April 2023 with tubular adenomas, polyp in the cecum not removed.  Repeat colonoscopy recommended but continues to be postponed due to fluid overload.   Plan / Recommendations  2 g sodium diet Continue IV Lasix Continue spironolactone 100 mg daily, monitor closely given history of mastodynia. Continue to monitor creatinine Continue albumin 50 g 3 times daily, last dose this evening. Outpatient colonoscopy in the future once fluid status improved.    LOS: 3 days    03/02/2022, 1:10 PM   Venetia Night, MSN, FNP-BC, AGACNP-BC [facility]

## 2022-03-03 DIAGNOSIS — R601 Generalized edema: Secondary | ICD-10-CM | POA: Diagnosis not present

## 2022-03-03 LAB — COMPREHENSIVE METABOLIC PANEL
ALT: 17 U/L (ref 0–44)
AST: 43 U/L — ABNORMAL HIGH (ref 15–41)
Albumin: 3.8 g/dL (ref 3.5–5.0)
Alkaline Phosphatase: 67 U/L (ref 38–126)
Anion gap: 9 (ref 5–15)
BUN: 14 mg/dL (ref 8–23)
CO2: 29 mmol/L (ref 22–32)
Calcium: 8.8 mg/dL — ABNORMAL LOW (ref 8.9–10.3)
Chloride: 101 mmol/L (ref 98–111)
Creatinine, Ser: 1.58 mg/dL — ABNORMAL HIGH (ref 0.61–1.24)
GFR, Estimated: 49 mL/min — ABNORMAL LOW (ref >60)
Glucose, Bld: 103 mg/dL — ABNORMAL HIGH (ref 70–99)
Potassium: 3.7 mmol/L (ref 3.5–5.1)
Sodium: 139 mmol/L (ref 135–145)
Total Bilirubin: 3.7 mg/dL — ABNORMAL HIGH (ref 0.3–1.2)
Total Protein: 6.3 g/dL — ABNORMAL LOW (ref 6.5–8.1)

## 2022-03-03 LAB — MAGNESIUM: Magnesium: 2 mg/dL (ref 1.7–2.4)

## 2022-03-03 MED ORDER — FUROSEMIDE 10 MG/ML IJ SOLN
40.0000 mg | Freq: Every day | INTRAMUSCULAR | Status: DC
  Filled 2022-03-03: qty 4

## 2022-03-03 NOTE — Progress Notes (Signed)
PROGRESS NOTE   Daniel Crawford  ZCH:[id number] DOB: September 03, 1955 DOA: 02/27/2022 PCP: [name], MD   Chief Complaint  Patient presents with   Shortness of Breath   Level of care: Telemetry  Brief Admission History:  66 y.o. male with medical history significant of type 2 diabetes mellitus, hypertension, hypothyroidism, CHF, cirrhosis due to NASH and thrombocytopenia who presents to the emergency department due to increased leg swelling and abdominal distention which worsened within the last 3 days.  He complained of leg tightening due to increased fluid in legs, he also complained of abdomen becoming harder due to fluid accumulation, this is associated with some shortness of breath on mild exertion. He was recently admitted from 10/30 to 11/2 due to NASH/cirrhosis decompensated with anasarca in the setting of CHF and chronic kidney disease.  He was treated with IV Lasix and oral Aldactone and outpatient IR consult for possible TIPS.  He met with IR but they felt that he was not a good candidate for TIPS due to his pulmonary hypertension.     ED Course:  In the emergency department, BP was 150/97 and other vital signs were within normal range.  Workup in the ED showed pancytopenia (WBC 3.6, hemoglobin 11.6, hematocrit 36.0, platelets 68).  Ammonia 34, BNP 34.  Influenza A, B, SARS coronavirus 2 was negative.  Chest x-ray showed low lung volumes.  No acute findings.  IV Lasix 40 mg x 1 was given.  Hospitalist was asked to admit patient for further evaluation and management.   Assessment and Plan: NASH/cirrhosis decompensated with anasarca Patient presents with distended abdomen but significant ascites ruled out Patient is on IV Lasix; Aldactone 100 mg daily Continue rifaximin Ultrasound guided paracentesis was ordered but per radiology [place] there was not enough fluid present for a paracentesis and procedure was discontinued He has diuresed over 7L since admission   Intake/Output Summary (Last  24 hours) at 03/03/2022 1035 Last data filed at 03/03/2022 2878 Gross per 24 hour  Intake 960 ml  Output 4500 ml  Net -3540 ml   Filed Weights   03/02/22 0500 03/03/22 0450  Weight: (!) 179.7 kg (!) 178.7 kg    Chronic thrombocytopenia Platelets 65, continue to monitor for bleeding complications   Iron deficiency anemia Continue ferrous sulfate    Hypoalbuminemia secondary to moderate protein calorie malnutrition Albumin 2.9, protein supplement to be provided IV albumin ordered by GI service thru 12/9   GERD/abdominal pain Continue Protonix Continue oxycodone as needed   Acquired hypothyroidism Continue Synthroid  AKI From aggressive diuresis Reduced IV lasix to once daily to give diuretic holiday Follow BMP   Essential hypertension Continue Coreg, spironolactone, Lasix   Depression Continue Zoloft  DVT prophylaxis: scds Code Status: full  Family Communication: updated pt with plan of care at bedside  Disposition: Status is: Inpatient Remains inpatient appropriate because: IV treatment required for diuresis    Consultants:  GI service  Procedures:  Paracentesis cancelled by [place] radiology 12/6  Antimicrobials:    Subjective: Pt reports that he is urinating a lot.  He is worried he needs to get home to his dog.    Objective: Vitals:   03/02/22 1438 03/02/22 2043 03/03/22 0450 03/03/22 0455  BP: (!) 109/53 (!) 100/49  133/64  Pulse: 65 63  68  Resp: _0 Temp: 98.3 F (36.8 C) 98.6 F (37 C)  98.3 F (36.8 C)  TempSrc: Oral Oral  Oral  SpO2: 97% 96%  95%  Weight:   (!) 178.7 kg     Intake/Output Summary (Last 24 hours) at 03/03/2022 1033 Last data filed at 03/03/2022 0102 Gross per 24 hour  Intake 960 ml  Output 4500 ml  Net -3540 ml   Filed Weights   03/02/22 0500 03/03/22 0450  Weight: (!) 179.7 kg (!) 178.7 kg   Examination:  General exam: appears massively volume overloaded Appears calm and comfortable  Respiratory system: Clear to  auscultation. Respiratory effort normal. Cardiovascular system: normal S1 & S2 heard. No JVD, murmurs, rubs, gallops or clicks. 2+ bilateral lower extremity and pedal edema. Gastrointestinal system: Abdomen is nondistended, soft and nontender. No organomegaly or masses felt. Normal bowel sounds heard. Central nervous system: Alert and oriented. No focal neurological deficits. Extremities: Symmetric 5 x 5 power. Skin: No rashes, lesions or ulcers. Psychiatry: Judgement and insight appear normal. Mood & affect appropriate.   Data Reviewed: I have personally reviewed following labs and imaging studies  CBC: Recent Labs  Lab 02/27/22 1838 02/28/22 0521 03/01/22 0612 03/02/22 0529  WBC 3.6* 3.5* 3.1* 2.7*  HGB 11.6* 10.9* 10.6* 11.0*  HCT 36.0* 33.3* 32.9* 33.4*  MCV 89.8 89.3 89.4 89.5  PLT 68* 67* 65* 59*    Basic Metabolic Panel: Recent Labs  Lab 02/27/22 1838 02/28/22 0521 03/01/22 0612 03/02/22 0529 03/03/22 0446  NA 135 137 138 139 139  K 4.2 3.4* 3.3* 3.7 3.7  CL 101 102 101 101 101  CO2 _0 GLUCOSE 168* 108* 106* 103* 103*  BUN _1 CREATININE 1.30* 1.35* 1.37* 1.41* 1.58*  CALCIUM 8.6* 8.6* 8.4* 8.6* 8.8*  MG  --  2.0 1.9 2.0 2.0  PHOS  --  3.4  --   --   --     CBG: Recent Labs  Lab 02/28/22 2033 03/02/22 1655  GLUCAP 133* 101*    Recent Results (from the past 240 hour(s))  Resp Panel by RT-PCR (Flu A&B, Covid) Anterior Nasal Swab     Status: None   Collection Time: 02/27/22  6:48 PM   Specimen: Anterior Nasal Swab  Result Value Ref Range Status   SARS Coronavirus 2 by RT PCR NEGATIVE NEGATIVE Final    Comment: (NOTE) SARS-CoV-2 target nucleic acids are NOT DETECTED.  The SARS-CoV-2 RNA is generally detectable in upper respiratory specimens during the acute phase of infection. The lowest concentration of SARS-CoV-2 viral copies this assay can detect is 138 copies/mL. A negative result does not preclude SARS-Cov-2 infection  and should not be used as the sole basis for treatment or other patient management decisions. A negative result Gaglio occur with  improper specimen collection/handling, submission of specimen other than nasopharyngeal swab, presence of viral mutation(s) within the areas targeted by this assay, and inadequate number of viral copies(<138 copies/mL). A negative result must be combined with clinical observations, patient history, and epidemiological information. The expected result is Negative.  Fact Sheet for Patients:  EntrepreneurPulse.com.au  Fact Sheet for Healthcare Providers:  IncredibleEmployment.be  This test is no t yet approved or cleared by the [place] FDA and  has been authorized for detection and/or diagnosis of SARS-CoV-2 by FDA under an Emergency Use Authorization (EUA). This EUA will remain  in effect (meaning this test can be used) for the duration of the COVID-19 declaration under Section 564(b)(1) of the Act, 21 U.S.C.section 360bbb-3(b)(1), unless the authorization is terminated  or revoked sooner.       Influenza A  by PCR NEGATIVE NEGATIVE Final   Influenza B by PCR NEGATIVE NEGATIVE Final    Comment: (NOTE) The Xpert Xpress SARS-CoV-2/FLU/RSV plus assay is intended as an aid in the diagnosis of influenza from Nasopharyngeal swab specimens and should not be used as a sole basis for treatment. Nasal washings and aspirates are unacceptable for Xpert Xpress SARS-CoV-2/FLU/RSV testing.  Fact Sheet for Patients: EntrepreneurPulse.com.au  Fact Sheet for Healthcare Providers: IncredibleEmployment.be  This test is not yet approved or cleared by the [place] FDA and has been authorized for detection and/or diagnosis of SARS-CoV-2 by FDA under an Emergency Use Authorization (EUA). This EUA will remain in effect (meaning this test can be used) for the duration of the COVID-19 declaration  under Section 564(b)(1) of the Act, 21 U.S.C. section 360bbb-3(b)(1), unless the authorization is terminated or revoked.  Performed at [facility], [address], [place], [place] [address]      Radiology Studies: No results found.  Scheduled Meds:  carvedilol  6.25 mg Oral BID WC   feeding supplement (GLUCERNA SHAKE)  237 mL Oral TID BM   ferrous sulfate  325 mg Oral QODAY   [START ON 03/04/2022] furosemide  40 mg Intravenous Daily   levothyroxine  175 mcg Oral Q0600   pantoprazole  40 mg Oral Daily   potassium chloride SA  40 mEq Oral BID   rifaximin  550 mg Oral BID   sertraline  50 mg Oral QHS   spironolactone  100 mg Oral Daily   traZODone  50 mg Oral QHS   Continuous Infusions:   LOS: 4 days   Time spent: 35 mins  Alaena Strader Wynetta Emery, MD How to contact the [facility] Attending or Consulting provider [place] or covering provider during after hours [place], for this patient?  Check the care team in [facility] and look for a) attending/consulting TRH provider listed and b) the [facility] team listed Log into [email or web address] and use [place]'s universal password to access. If you do not have the password, please contact the hospital operator. Locate the [facility] provider you are looking for under [facility] and page to a number that you can be directly reached. If you still have difficulty reaching the provider, please page the [facility] (Director on Call) for the Hospitalists listed on amion for assistance.  03/03/2022, 10:33 AM   '

## 2022-03-03 NOTE — Progress Notes (Signed)
Patient feels better wants to go home;   says he is less swollen. States he has a follow-up appointment with [facility] on him.  Finished albumin this morning  Vital signs in last 24 hours: Temp:  [98.2 F (36.8 C)-98.6 F (37 C)] 98.2 F (36.8 C) (12/09 1517) Pulse Rate:  [61-68] 61 (12/09 1517) Resp:  [20] 20 (12/09 1517) BP: (97-133)/(49-72) 97/72 (12/09 1517) SpO2:  [95 %-96 %] 95 % (12/09 1517) Weight:  [178.7 kg] 178.7 kg (12/09 0450) Last BM Date : 03/02/22 General:   Alert,  pleasant and cooperative in NAD Abdomen:    Obese.  He has pitting  abdominal wall edema extends down into the thighs and legs 2+ bilaterally. Intake/Output from previous day: 12/08 0701 - 12/09 0700 In: 840 [P.O.:840] Out: 3600 [Urine:3600] Intake/Output this shift: Total I/O In: 600 [P.O.:600] Out: 2500 [Urine:2500]  Lab Results: Recent Labs    03/01/22 0612 03/02/22 0529  WBC 3.1* 2.7*  HGB 10.6* 11.0*  HCT 32.9* 33.4*  PLT 65* 59*   BMET Recent Labs    03/01/22 0612 03/02/22 0529 03/03/22 0446  NA 138 139 139  K 3.3* 3.7 3.7  CL 101 101 101  CO2 '30 30 29  '$ GLUCOSE 106* 103* 103*  BUN '12 12 14  '$ CREATININE 1.37* 1.41* 1.58*  CALCIUM 8.4* 8.6* 8.8*   LFT Recent Labs    03/03/22 0446  PROT 6.3*  ALBUMIN 3.8  AST 43*  ALT 17  ALKPHOS 9  BILITOT 3.7*    Impression:   66 year old gentleman with decompensated [place] cirrhosis.  Significant anasarca (primarily subcutaneous fluid; little intraperitoneal fluid for tap per ultrasound) Finished his third dose of albumin this morning.  It looks like he has lost about 3 kg since admission.  Approximately 2 L on day shift today. Somewhat meager diuresis on Lasix 40 mg and spironolactone 100 mg daily.  He is not a TIPS or liver transplant candidate.  Bump in creatinine noted.  This is a concerning trend.  Recommendations:   I doubt further albumin therapy would make much difference here   Continue spironolactone 100  mg and Lasix 40 mg daily   strict 2 g sodium diet.   Strongly recommend follow-up with [facility].   Will arrange outpatient follow-up with Dr. Jenetta Downer   Continue Xifaxan.   Further treatment options are limited.  Overall prognosis is guarded.

## 2022-03-04 DIAGNOSIS — K7460 Unspecified cirrhosis of liver: Secondary | ICD-10-CM | POA: Diagnosis not present

## 2022-03-04 DIAGNOSIS — D696 Thrombocytopenia, unspecified: Secondary | ICD-10-CM | POA: Diagnosis not present

## 2022-03-04 DIAGNOSIS — E039 Hypothyroidism, unspecified: Secondary | ICD-10-CM | POA: Diagnosis not present

## 2022-03-04 DIAGNOSIS — R601 Generalized edema: Secondary | ICD-10-CM | POA: Diagnosis not present

## 2022-03-04 LAB — BASIC METABOLIC PANEL
Anion gap: 6 (ref 5–15)
BUN: 14 mg/dL (ref 8–23)
CO2: 31 mmol/L (ref 22–32)
Calcium: 9.3 mg/dL (ref 8.9–10.3)
Chloride: 103 mmol/L (ref 98–111)
Creatinine, Ser: 1.32 mg/dL — ABNORMAL HIGH (ref 0.61–1.24)
GFR, Estimated: >60 mL/min (ref >60)
Glucose, Bld: 104 mg/dL — ABNORMAL HIGH (ref 70–99)
Potassium: 4.1 mmol/L (ref 3.5–5.1)
Sodium: 140 mmol/L (ref 135–145)

## 2022-03-04 MED ORDER — SPIRONOLACTONE 100 MG PO TABS: 100.0000 mg | ORAL_TABLET | Freq: Every day | ORAL | 1 refills | Status: DC

## 2022-03-04 MED ORDER — ALBUTEROL SULFATE HFA 108 (90 BASE) MCG/ACT IN AERS: 2.0000 | INHALATION_SPRAY | Freq: Four times a day (QID) | RESPIRATORY_TRACT | 2 refills | Status: DC | PRN

## 2022-03-04 MED ORDER — POTASSIUM CHLORIDE CRYS ER 20 MEQ PO TBCR: 40.0000 meq | EXTENDED_RELEASE_TABLET | Freq: Every day | ORAL | 0 refills | Status: DC

## 2022-03-04 NOTE — Discharge Instructions (Addendum)
Please have labs checked in 1 week with PCP  Please weigh daily and report a weight gain of 5 pounds or more to GI doctor and PCP.     IMPORTANT INFORMATION: PAY CLOSE ATTENTION   PHYSICIAN DISCHARGE INSTRUCTIONS  Follow with Primary care provider  [name], MD  and other consultants as instructed by your Hospitalist Physician  [place] IF SYMPTOMS COME BACK, WORSEN OR NEW PROBLEM DEVELOPS   Please note: You were cared for by a hospitalist during your hospital stay. Every effort will be made to forward records to your primary care provider.  You can request that your primary care provider send for your hospital records if they have not received them.  Once you are discharged, your primary care physician will handle any further medical issues. Please note that NO REFILLS for any discharge medications will be authorized once you are discharged, as it is imperative that you return to your primary care physician (or establish a relationship with a primary care physician if you do not have one) for your post hospital discharge needs so that they can reassess your need for medications and monitor your lab values.  Please get a complete blood count and chemistry panel checked by your Primary MD at your next visit, and again as instructed by your Primary MD.  Get Medicines reviewed and adjusted: Please take all your medications with you for your next visit with your Primary MD  Laboratory/radiological data: Please request your Primary MD to go over all hospital tests and procedure/radiological results at the follow up, please ask your primary care provider to get all Hospital records sent to his/her office.  In some cases, they will be blood work, cultures and biopsy results pending at the time of your discharge. Please request that your primary care provider follow up on these results.  If you are diabetic, please bring your blood sugar readings with  you to your follow up appointment with primary care.    Please call and make your follow up appointments as soon as possible.    Also Note the following: If you experience worsening of your admission symptoms, develop shortness of breath, life threatening emergency, suicidal or homicidal thoughts you must seek medical attention immediately by calling 911 or calling your MD immediately  if symptoms less severe.  You must read complete instructions/literature along with all the possible adverse reactions/side effects for all the Medicines you take and that have been prescribed to you. Take any new Medicines after you have completely understood and accpet all the possible adverse reactions/side effects.   Do not drive when taking Pain medications or sleeping medications (Benzodiazepines)  Do not take more than prescribed Pain, Sleep and Anxiety Medications. It is not advisable to combine anxiety,sleep and pain medications without talking with your primary care practitioner  Special Instructions: If you have smoked or chewed Tobacco  in the last 2 yrs please stop smoking, stop any regular Alcohol  and or any Recreational drug use.  Wear Seat belts while driving.  Do not drive if taking any narcotic, mind altering or controlled substances or recreational drugs or alcohol.

## 2022-03-04 NOTE — Discharge Summary (Addendum)
Physician Discharge Summary  Corban Biederman PPJ:[id number] DOB: [birth year] DOA: 02/27/2022  PCP: [name], MD GI: Jenetta Downer  Admit date: 02/27/2022 Discharge date: 03/04/2022  Admitted From:  Home  Disposition:  Home   Recommendations for Outpatient Follow-up:  Follow up with PCP in 1 weeks Follow up with GI in 2 weeks  Please obtain BMP in one week to follow up electrolytes, renal function  Discharge Condition: STABLE   CODE STATUS: FULL DIET: 2 gm sodium restricted    Brief Hospitalization Summary: Please see all hospital notes, images, labs for full details of the hospitalization. ADMISSION HPI: 66 y.o. male with medical history significant of type 2 diabetes mellitus, hypertension, hypothyroidism, CHF, cirrhosis due to NASH and thrombocytopenia who presents to the emergency department due to increased leg swelling and abdominal distention which worsened within the last 3 days.  He complained of leg tightening due to increased fluid in legs, he also complained of abdomen becoming harder due to fluid accumulation, this is associated with some shortness of breath on mild exertion. He was recently admitted from 10/30 to 11/2 due to NASH/cirrhosis decompensated with anasarca in the setting of CHF and chronic kidney disease.  He was treated with IV Lasix and oral Aldactone and outpatient IR consult for possible TIPS.  He met with IR but they felt that he was not a good candidate for TIPS due to his pulmonary hypertension.     ED Course:  In the emergency department, BP was 150/97 and other vital signs were within normal range.  Workup in the ED showed pancytopenia (WBC 3.6, hemoglobin 11.6, hematocrit 36.0, platelets 68).  Ammonia 34, BNP 34.  Influenza A, B, SARS coronavirus 2 was negative.  Chest x-ray showed low lung volumes.  No acute findings.  IV Lasix 40 mg x 1 was given.  Hospitalist was asked to admit patient for further evaluation and management.  Hospital course by problem    NASH/cirrhosis decompensated with anasarca Patient presents with distended abdomen but significant ascites ruled out Patient was treated with IV Lasix with good diuresis; Aldactone 100 mg daily Continue rifaximin Ultrasound guided paracentesis was ordered but per radiology [place] there was not enough fluid present for a paracentesis and procedure was discontinued He has diuresed over 8L since admission  Resume home oral lasix 60 mg BID, plus aldactone 100 mg daily; Kdur 40 meq daily;   Pt was strongly advised to see PCP in 1 week to have BMP labs drawn.  Pt strongly advised to follow up with GI clinic in 2 weeks for recheck.  Pt verbalized understanding.      Intake/Output Summary (Last 24 hours) at 03/04/2022 1008 Last data filed at 03/04/2022 5809 Gross per 24 hour  Intake 1320 ml  Output 2500 ml  Net -1180 ml   Filed Weights   03/02/22 0500 03/03/22 0450 03/04/22 0500  Weight: (!) 179.7 kg (!) 178.7 kg (!) 177.6 kg    Chronic thrombocytopenia Platelets 65, continue to monitor for bleeding complications   Iron deficiency anemia Hg has been stable; outpatient follow up with PCP recommended    Hypoalbuminemia secondary to moderate protein calorie malnutrition Albumin 2.9, protein supplement to be provided IV albumin ordered by GI service thru 12/9   GERD/abdominal pain Continue Protonix Continue oxycodone as needed   Acquired hypothyroidism Continue Synthroid   AKI From aggressive diuresis Reduced IV lasix to once daily to give diuretic holiday and creatinine down to 1.32 today.  DC IV lasix and resume home oral  lasix.  Follow BMP outpatient as recommended with BMP check in 1 week with PCP   Essential hypertension Continue Coreg, spironolactone, Lasix   Depression Continue Zoloft; trazodone  Discharge Diagnoses:  Principal Problem:   Anasarca Active Problems:   Acquired hypothyroidism   Cirrhosis of liver with ascites ([place])   Depression   Thrombocytopenia  (HCC)   Essential hypertension   Iron deficiency anemia   Hypoalbuminemia due to protein-calorie malnutrition (HCC)   GERD (gastroesophageal reflux disease)   Abdominal pain   Discharge Instructions:  Allergies as of 03/04/2022   No Known Allergies      Medication List     TAKE these medications    acetaminophen 500 MG tablet Commonly known as: TYLENOL Take 1,000 mg by mouth every 6 (six) hours as needed for moderate pain.   albuterol 108 (90 Base) MCG/ACT inhaler Commonly known as: VENTOLIN HFA Inhale 2 puffs into the lungs every 6 (six) hours as needed for wheezing or shortness of breath.   carvedilol 6.25 MG tablet Commonly known as: COREG Take 1 tablet (6.25 mg total) by mouth 2 (two) times daily with a meal.   furosemide 20 MG tablet Commonly known as: Lasix Take 3 tablets (60 mg total) by mouth 2 (two) times daily.   gabapentin 100 MG capsule Commonly known as: NEURONTIN Take 100 mg by mouth daily as needed (pain).   hydrOXYzine 50 MG capsule Commonly known as: VISTARIL Take 1 capsule (50 mg total) by mouth 3 (three) times daily as needed for anxiety or nausea.   levothyroxine 175 MCG tablet Commonly known as: SYNTHROID Take 175 mcg by mouth daily before breakfast.   ondansetron 8 MG tablet Commonly known as: ZOFRAN Take 8 mg by mouth every 8 (eight) hours as needed for nausea or vomiting.   pantoprazole 40 MG tablet Commonly known as: PROTONIX Take 1 tablet (40 mg total) by mouth daily.   potassium chloride SA 20 MEQ tablet Commonly known as: KLOR-CON M Take 2 tablets (40 mEq total) by mouth daily. Start taking on: March 05, 2022 What changed: how much to take   rifaximin 550 MG Tabs tablet Commonly known as: XIFAXAN Take 1 tablet (550 mg total) by mouth 2 (two) times daily.   sertraline 50 MG tablet Commonly known as: ZOLOFT Take 1 tablet (50 mg total) by mouth at bedtime.   spironolactone 100 MG tablet Commonly known as:  ALDACTONE Take 1 tablet (100 mg total) by mouth daily. What changed:  medication strength how much to take   traZODone 50 MG tablet Commonly known as: DESYREL Take 1 tablet (50 mg total) by mouth at bedtime.        Follow-up Information     [name], MD. Schedule an appointment as soon as possible for a visit in 1 week(s).   Specialties: Family Medicine, Sports Medicine Why: Hospital Follow Up and check labs Contact information: [address] [place] [address] [phone]         Montez Morita, Quillian Quince, MD. Schedule an appointment as soon as possible for a visit in 2 week(s).   Specialty: Gastroenterology Why: Hospital Follow Up Contact information: [address] [place] [address] [phone]                No Known Allergies Allergies as of 03/04/2022   No Known Allergies      Medication List     TAKE these medications    acetaminophen 500 MG tablet  Commonly known as: TYLENOL Take 1,000 mg by mouth every 6 (six) hours as needed for moderate pain.   albuterol 108 (90 Base) MCG/ACT inhaler Commonly known as: VENTOLIN HFA Inhale 2 puffs into the lungs every 6 (six) hours as needed for wheezing or shortness of breath.   carvedilol 6.25 MG tablet Commonly known as: COREG Take 1 tablet (6.25 mg total) by mouth 2 (two) times daily with a meal.   furosemide 20 MG tablet Commonly known as: Lasix Take 3 tablets (60 mg total) by mouth 2 (two) times daily.   gabapentin 100 MG capsule Commonly known as: NEURONTIN Take 100 mg by mouth daily as needed (pain).   hydrOXYzine 50 MG capsule Commonly known as: VISTARIL Take 1 capsule (50 mg total) by mouth 3 (three) times daily as needed for anxiety or nausea.   levothyroxine 175 MCG tablet Commonly known as: SYNTHROID Take 175 mcg by mouth daily before breakfast.   ondansetron 8 MG tablet Commonly known as: ZOFRAN Take 8 mg by mouth every 8 (eight) hours as  needed for nausea or vomiting.   pantoprazole 40 MG tablet Commonly known as: PROTONIX Take 1 tablet (40 mg total) by mouth daily.   potassium chloride SA 20 MEQ tablet Commonly known as: KLOR-CON M Take 2 tablets (40 mEq total) by mouth daily. Start taking on: March 05, 2022 What changed: how much to take   rifaximin 550 MG Tabs tablet Commonly known as: XIFAXAN Take 1 tablet (550 mg total) by mouth 2 (two) times daily.   sertraline 50 MG tablet Commonly known as: ZOLOFT Take 1 tablet (50 mg total) by mouth at bedtime.   spironolactone 100 MG tablet Commonly known as: ALDACTONE Take 1 tablet (100 mg total) by mouth daily. What changed:  medication strength how much to take   traZODone 50 MG tablet Commonly known as: DESYREL Take 1 tablet (50 mg total) by mouth at bedtime.        Procedures/Studies: [place] ASCITES (ABDOMEN LIMITED)  Result Date: 02/28/2022 CLINICAL DATA:  Ascites, history CHF, chronic kidney disease, diabetes mellitus, hypertension, cirrhosis due to NASH EXAM: LIMITED ABDOMEN ULTRASOUND FOR ASCITES TECHNIQUE: Limited ultrasound survey for ascites was performed in all four abdominal quadrants. COMPARISON:  01/23/2022 FINDINGS: Trace ascites identified. Volume of ascites is insufficient for paracentesis. IMPRESSION: Insufficient ascites for paracentesis. Electronically Signed   By: Lavonia Dana M.D.   On: 02/28/2022 08:52   DG Chest Port 1 View  Result Date: 02/27/2022 CLINICAL DATA:  Shortness of breath.  Abdominal distension. EXAM: PORTABLE CHEST 1 VIEW COMPARISON:  01/22/2022 FINDINGS: Heart size and mediastinal contours are unremarkable. Lungs are hypoinflated. There is no pleural fluid or interstitial edema. No airspace disease. Visualized osseous structures appear intact. IMPRESSION: Low lung volumes. No acute findings. Electronically Signed   By: Kerby Moors M.D.   On: 02/27/2022 18:51   IR Radiologist Eval & Mgmt  Result Date: 02/19/2022 EXAM:  NEW PATIENT OFFICE VISIT CHIEF COMPLAINT: See Epic note. HISTORY OF PRESENT ILLNESS: See Epic note. REVIEW OF SYSTEMS: See Epic note. PHYSICAL EXAMINATION: See Epic note. ASSESSMENT AND PLAN: See Epic note. Ruthann Cancer, MD [facility] Electronically Signed   By: Ruthann Cancer M.D.   On: 02/19/2022 15:05     Subjective: Pt says he is feeling much better overall and lost a lot of weight and edema in the legs and abdomen.    Discharge Exam: Vitals:   03/03/22 2008 03/04/22 8563  BP: (!) 113/55 130/61  Pulse: 66 70  Resp: 20 20  Temp: 99.2 F (37.3 C) 98 F (36.7 C)  SpO2: 97% 96%   Vitals:   03/03/22 2008 03/03/22 2113 03/04/22 0500 03/04/22 0528  BP: (!) 113/55   130/61  Pulse: 66   70  Resp: 20   20  Temp: 99.2 F (37.3 C)   98 F (36.7 C)  TempSrc: Oral     SpO2: 97%   96%  Weight:   (!) 177.6 kg   Height:  _0  (1.88 m)     eneral exam: appears massively volume overloaded Appears calm and comfortable  Respiratory system: Clear to auscultation. Respiratory effort normal. Cardiovascular system: normal S1 & S2 heard. No JVD, murmurs, rubs, gallops or clicks. 1+ bilateral lower extremity and pedal edema. Gastrointestinal system: Abdomen is nondistended, soft and nontender. No organomegaly or masses felt. Normal bowel sounds heard. Central nervous system: Alert and oriented. No focal neurological deficits. Extremities: Symmetric 5 x 5 power. Skin: No rashes, lesions or ulcers. Psychiatry: Judgement and insight appear normal. Mood & affect appropriate.   The results of significant diagnostics from this hospitalization (including imaging, microbiology, ancillary and laboratory) are listed below for reference.     Microbiology: Recent Results (from the past 240 hour(s))  Resp Panel by RT-PCR (Flu A&B, Covid) Anterior Nasal Swab     Status: None   Collection Time: 02/27/22  6:48 PM   Specimen: Anterior Nasal Swab   Result Value Ref Range Status   SARS Coronavirus 2 by RT PCR NEGATIVE NEGATIVE Final    Comment: (NOTE) SARS-CoV-2 target nucleic acids are NOT DETECTED.  The SARS-CoV-2 RNA is generally detectable in upper respiratory specimens during the acute phase of infection. The lowest concentration of SARS-CoV-2 viral copies this assay can detect is 138 copies/mL. A negative result does not preclude SARS-Cov-2 infection and should not be used as the sole basis for treatment or other patient management decisions. A negative result Yu occur with  improper specimen collection/handling, submission of specimen other than nasopharyngeal swab, presence of viral mutation(s) within the areas targeted by this assay, and inadequate number of viral copies(<138 copies/mL). A negative result must be combined with clinical observations, patient history, and epidemiological information. The expected result is Negative.  Fact Sheet for Patients:  EntrepreneurPulse.com.au  Fact Sheet for Healthcare Providers:  IncredibleEmployment.be  This test is no t yet approved or cleared by the [place] FDA and  has been authorized for detection and/or diagnosis of SARS-CoV-2 by FDA under an Emergency Use Authorization (EUA). This EUA will remain  in effect (meaning this test can be used) for the duration of the COVID-19 declaration under Section 564(b)(1) of the Act, 21 U.S.C.section 360bbb-3(b)(1), unless the authorization is terminated  or revoked sooner.       Influenza A by PCR NEGATIVE NEGATIVE Final   Influenza B by PCR NEGATIVE NEGATIVE Final    Comment: (NOTE) The Xpert Xpress SARS-CoV-2/FLU/RSV plus assay is intended as an aid in the diagnosis of influenza from Nasopharyngeal swab specimens and should not be used as a sole basis for treatment. Nasal washings and aspirates are unacceptable for Xpert Xpress SARS-CoV-2/FLU/RSV testing.  Fact Sheet for  Patients: EntrepreneurPulse.com.au  Fact Sheet for Healthcare Providers: IncredibleEmployment.be  This test is not yet approved or cleared by the [place] FDA and has been authorized for detection and/or diagnosis of SARS-CoV-2 by FDA under an Emergency Use Authorization (EUA). This EUA will remain in  effect (meaning this test can be used) for the duration of the COVID-19 declaration under Section 564(b)(1) of the Act, 21 U.S.C. section 360bbb-3(b)(1), unless the authorization is terminated or revoked.  Performed at [facility], [address], [place], [place] [address]      Labs: BNP (last 3 results) Recent Labs    01/22/22 1755 02/27/22 1838  BNP 58.0 78.4   Basic Metabolic Panel: Recent Labs  Lab 02/28/22 0521 03/01/22 0612 03/02/22 0529 03/03/22 0446 03/04/22 0506  NA 137 138 139 139 140  K 3.4* 3.3* 3.7 3.7 4.1  CL 102 101 101 101 103  CO2 _0 GLUCOSE 108* 106* 103* 103* 104*  BUN _1 CREATININE 1.35* 1.37* 1.41* 1.58* 1.32*  CALCIUM 8.6* 8.4* 8.6* 8.8* 9.3  MG 2.0 1.9 2.0 2.0  --   PHOS 3.4  --   --   --   --    Liver Function Tests: Recent Labs  Lab 02/27/22 1838 02/28/22 0521 03/01/22 0612 03/02/22 0529 03/03/22 0446  AST 53* 47* 48* 49* 43*  ALT _2 ALKPHOS 96 91 84 81 67  BILITOT 3.7* 3.7* 4.1* 3.7* 3.7*  PROT 6.6 6.5 6.2* 6.4* 6.3*  ALBUMIN 2.9* 3.0* 2.9* 3.3* 3.8   No results for input(s): "LIPASE", "AMYLASE" in the last 168 hours. Recent Labs  Lab 02/27/22 1905  AMMONIA 34   CBC: Recent Labs  Lab 02/27/22 1838 02/28/22 0521 03/01/22 0612 03/02/22 0529  WBC 3.6* 3.5* 3.1* 2.7*  HGB 11.6* 10.9* 10.6* 11.0*  HCT 36.0* 33.3* 32.9* 33.4*  MCV 89.8 89.3 89.4 89.5  PLT 68* 67* 65* 59*   Cardiac Enzymes: No results for input(s): "CKTOTAL", "CKMB", "CKMBINDEX", "TROPONINI" in the last 168 hours. BNP: Invalid input(s): "POCBNP" CBG: Recent Labs  Lab  02/28/22 2033 03/02/22 1655  GLUCAP 133* 101*   D-Dimer No results for input(s): "DDIMER" in the last 72 hours. Hgb A1c No results for input(s): "HGBA1C" in the last 72 hours. Lipid Profile No results for input(s): "CHOL", "HDL", "LDLCALC", "TRIG", "CHOLHDL", "LDLDIRECT" in the last 72 hours. Thyroid function studies No results for input(s): "TSH", "T4TOTAL", "T3FREE", "THYROIDAB" in the last 72 hours.  Invalid input(s): "FREET3" Anemia work up No results for input(s): "VITAMINB12", "FOLATE", "FERRITIN", "TIBC", "IRON", "RETICCTPCT" in the last 72 hours. Urinalysis    Component Value Date/Time   COLORURINE STRAW (A) 02/28/2022 0840   APPEARANCEUR CLEAR 02/28/2022 0840   LABSPEC 1.005 02/28/2022 0840   PHURINE 6.0 02/28/2022 0840   GLUCOSEU NEGATIVE 02/28/2022 0840   HGBUR SMALL (A) 02/28/2022 0840   BILIRUBINUR NEGATIVE 02/28/2022 0840   KETONESUR NEGATIVE 02/28/2022 0840   PROTEINUR NEGATIVE 02/28/2022 0840   NITRITE NEGATIVE 02/28/2022 0840   LEUKOCYTESUR NEGATIVE 02/28/2022 0840   Sepsis Labs Recent Labs  Lab 02/27/22 1838 02/28/22 0521 03/01/22 0612 03/02/22 0529  WBC 3.6* 3.5* 3.1* 2.7*   Microbiology Recent Results (from the past 240 hour(s))  Resp Panel by RT-PCR (Flu A&B, Covid) Anterior Nasal Swab     Status: None   Collection Time: 02/27/22  6:48 PM   Specimen: Anterior Nasal Swab  Result Value Ref Range Status   SARS Coronavirus 2 by RT PCR NEGATIVE NEGATIVE Final    Comment: (NOTE) SARS-CoV-2 target nucleic acids are NOT DETECTED.  The SARS-CoV-2 RNA is generally detectable in upper respiratory specimens during the acute phase of infection. The lowest concentration of SARS-CoV-2 viral copies this  assay can detect is 138 copies/mL. A negative result does not preclude SARS-Cov-2 infection and should not be used as the sole basis for treatment or other patient management decisions. A negative result [place]avid occur with  improper specimen  collection/handling, submission of specimen other than nasopharyngeal swab, presence of viral mutation(s) within the areas targeted by this assay, and inadequate number of viral copies(<138 copies/mL). A negative result must be combined with clinical observations, patient history, and epidemiological information. The expected result is Negative.  Fact Sheet for Patients:  EntrepreneurPulse.com.au  Fact Sheet for Healthcare Providers:  IncredibleEmployment.be  This test is no t yet approved or cleared by the [place] FDA and  has been authorized for detection and/or diagnosis of SARS-CoV-2 by FDA under an Emergency Use Authorization (EUA). This EUA will remain  in effect (meaning this test can be used) for the duration of the COVID-19 declaration under Section 564(b)(1) of the Act, 21 U.S.C.section 360bbb-3(b)(1), unless the authorization is terminated  or revoked sooner.       Influenza A by PCR NEGATIVE NEGATIVE Final   Influenza B by PCR NEGATIVE NEGATIVE Final    Comment: (NOTE) The Xpert Xpress SARS-CoV-2/FLU/RSV plus assay is intended as an aid in the diagnosis of influenza from Nasopharyngeal swab specimens and should not be used as a sole basis for treatment. Nasal washings and aspirates are unacceptable for Xpert Xpress SARS-CoV-2/FLU/RSV testing.  Fact Sheet for Patients: EntrepreneurPulse.com.au  Fact Sheet for Healthcare Providers: IncredibleEmployment.be  This test is not yet approved or cleared by the [place] FDA and has been authorized for detection and/or diagnosis of SARS-CoV-2 by FDA under an Emergency Use Authorization (EUA). This EUA will remain in effect (meaning this test can be used) for the duration of the COVID-19 declaration under Section 564(b)(1) of the Act, 21 U.S.C. section 360bbb-3(b)(1), unless the authorization is terminated or revoked.  Performed at [facility], [address] [place], [place] [address]    Time coordinating discharge:  35 mins   SIGNED:  Irwin Brakeman, MD  [facility] 03/04/2022, 10:04 AM How to contact the [facility] Attending or Consulting provider [place] or covering provider during after hours [place], for this patient?  Check the care team in [facility] and look for a) attending/consulting [facility] provider listed and b) the [facility] team listed Log into [email or web address] and use [place]'s universal password to access. If you do not have the password, please contact the hospital operator. Locate the [facility] provider you are looking for under [facility] and page to a number that you can be directly reached. If you still have difficulty reaching the provider, please page the [facility] (Director on Call) for the Hospitalists listed on amion for assistance.

## 2022-03-04 NOTE — Plan of Care (Signed)
  Problem: Education: Goal: Knowledge of General Education information will improve Description Including pain rating scale, medication(s)/side effects and non-pharmacologic comfort measures Outcome: Progressing   

## 2022-03-04 NOTE — Progress Notes (Signed)
Nsg Discharge Note  Admit Date:  02/27/2022 Discharge date: 03/04/2022   Caidan Sedor to be D/C'd Home per MD order.  AVS completed.  Copy for chart, and copy for patient signed, and dated. Removed IV-CDI. Reviewed d/c paperwork with patient. Answered all questions. Wheeled stable patient and belongings to ED entrance and he drove himself home.  Discharge Medication: Allergies as of 03/04/2022   No Known Allergies      Medication List     TAKE these medications    acetaminophen 500 MG tablet Commonly known as: TYLENOL Take 1,000 mg by mouth every 6 (six) hours as needed for moderate pain.   albuterol 108 (90 Base) MCG/ACT inhaler Commonly known as: VENTOLIN HFA Inhale 2 puffs into the lungs every 6 (six) hours as needed for wheezing or shortness of breath.   carvedilol 6.25 MG tablet Commonly known as: COREG Take 1 tablet (6.25 mg total) by mouth 2 (two) times daily with a meal.   furosemide 20 MG tablet Commonly known as: Lasix Take 3 tablets (60 mg total) by mouth 2 (two) times daily.   gabapentin 100 MG capsule Commonly known as: NEURONTIN Take 100 mg by mouth daily as needed (pain).   hydrOXYzine 50 MG capsule Commonly known as: VISTARIL Take 1 capsule (50 mg total) by mouth 3 (three) times daily as needed for anxiety or nausea.   levothyroxine 175 MCG tablet Commonly known as: SYNTHROID Take 175 mcg by mouth daily before breakfast.   ondansetron 8 MG tablet Commonly known as: ZOFRAN Take 8 mg by mouth every 8 (eight) hours as needed for nausea or vomiting.   pantoprazole 40 MG tablet Commonly known as: PROTONIX Take 1 tablet (40 mg total) by mouth daily.   potassium chloride SA 20 MEQ tablet Commonly known as: KLOR-CON M Take 2 tablets (40 mEq total) by mouth daily. Start taking on: March 05, 2022 What changed: how much to take   rifaximin 550 MG Tabs tablet Commonly known as: XIFAXAN Take 1 tablet (550 mg total) by mouth 2 (two) times daily.    sertraline 50 MG tablet Commonly known as: ZOLOFT Take 1 tablet (50 mg total) by mouth at bedtime.   spironolactone 100 MG tablet Commonly known as: ALDACTONE Take 1 tablet (100 mg total) by mouth daily. What changed:  medication strength how much to take   traZODone 50 MG tablet Commonly known as: DESYREL Take 1 tablet (50 mg total) by mouth at bedtime.        Discharge Assessment: Vitals:   03/03/22 2008 03/04/22 0528  BP: (!) 113/55 130/61  Pulse: 66 70  Resp: 20 20  Temp: 99.2 F (37.3 C) 98 F (36.7 C)  SpO2: 97% 96%   Skin clean, dry and intact without evidence of skin break down, no evidence of skin tears noted. IV catheter discontinued intact. Site without signs and symptoms of complications - no redness or edema noted at insertion site, patient denies c/o pain - only slight tenderness at site.  Dressing with slight pressure applied.  D/c Instructions-Education: Discharge instructions given to patient/family with verbalized understanding. D/c education completed with patient/family including follow up instructions, medication list, d/c activities limitations if indicated, with other d/c instructions as indicated by MD - patient able to verbalize understanding, all questions fully answered. Patient instructed to return to ED, call 911, or call MD for any changes in condition.  Patient escorted via [place], and D/C home via private auto.  Santa Lighter, RN 03/04/2022 1:59  PM

## 2022-03-04 NOTE — Plan of Care (Signed)
  Problem: Education: Goal: Knowledge of General Education information will improve Description: Including pain rating scale, medication(s)/side effects and non-pharmacologic comfort measures 03/04/2022 1142 by Santa Lighter, RN Outcome: Adequate for Discharge 03/04/2022 1017 by Santa Lighter, RN Outcome: Progressing   Problem: Health Behavior/Discharge Planning: Goal: Ability to manage health-related needs will improve Outcome: Adequate for Discharge   Problem: Clinical Measurements: Goal: Ability to maintain clinical measurements within normal limits will improve Outcome: Adequate for Discharge Goal: Will remain free from infection Outcome: Adequate for Discharge Goal: Diagnostic test results will improve Outcome: Adequate for Discharge Goal: Respiratory complications will improve Outcome: Adequate for Discharge Goal: Cardiovascular complication will be avoided Outcome: Adequate for Discharge   Problem: Activity: Goal: Risk for activity intolerance will decrease Outcome: Adequate for Discharge   Problem: Nutrition: Goal: Adequate nutrition will be maintained Outcome: Adequate for Discharge   Problem: Coping: Goal: Level of anxiety will decrease Outcome: Adequate for Discharge   Problem: Elimination: Goal: Will not experience complications related to bowel motility Outcome: Adequate for Discharge Goal: Will not experience complications related to urinary retention Outcome: Adequate for Discharge   Problem: Pain Managment: Goal: General experience of comfort will improve Outcome: Adequate for Discharge   Problem: Safety: Goal: Ability to remain free from injury will improve Outcome: Adequate for Discharge   Problem: Skin Integrity: Goal: Risk for impaired skin integrity will decrease Outcome: Adequate for Discharge

## 2022-03-04 NOTE — Plan of Care (Signed)
  Problem: Clinical Measurements: Goal: Will remain free from infection Outcome: Progressing   Problem: Clinical Measurements: Goal: Diagnostic test results will improve Outcome: Progressing   Problem: Clinical Measurements: Goal: Respiratory complications will improve Outcome: Progressing   Problem: Clinical Measurements: Goal: Cardiovascular complication will be avoided Outcome: Progressing   Problem: Activity: Goal: Risk for activity intolerance will decrease Outcome: Progressing   Problem: Coping: Goal: Level of anxiety will decrease Outcome: Progressing

## 2022-03-09 DIAGNOSIS — I509 Heart failure, unspecified: Secondary | ICD-10-CM | POA: Diagnosis not present

## 2022-03-09 DIAGNOSIS — Z79899 Other long term (current) drug therapy: Secondary | ICD-10-CM | POA: Diagnosis not present

## 2022-03-09 DIAGNOSIS — R609 Edema, unspecified: Secondary | ICD-10-CM | POA: Diagnosis not present

## 2022-03-09 DIAGNOSIS — K7460 Unspecified cirrhosis of liver: Secondary | ICD-10-CM | POA: Diagnosis not present

## 2022-03-09 DIAGNOSIS — E039 Hypothyroidism, unspecified: Secondary | ICD-10-CM | POA: Diagnosis not present

## 2022-03-13 ENCOUNTER — Telehealth: Payer: Self-pay | Admitting: *Deleted

## 2022-03-13 ENCOUNTER — Ambulatory Visit (INDEPENDENT_AMBULATORY_CARE_PROVIDER_SITE_OTHER): Payer: Medicare HMO | Admitting: Gastroenterology

## 2022-03-13 ENCOUNTER — Telehealth: Payer: Self-pay | Admitting: Gastroenterology

## 2022-03-13 ENCOUNTER — Encounter: Payer: Self-pay | Admitting: Gastroenterology

## 2022-03-13 ENCOUNTER — Other Ambulatory Visit: Payer: Self-pay | Admitting: *Deleted

## 2022-03-13 VITALS — BP 132/77 | HR 73 | Temp 99.5°F | Ht 74.0 in | Wt 365.4 lb

## 2022-03-13 DIAGNOSIS — K7581 Nonalcoholic steatohepatitis (NASH): Secondary | ICD-10-CM | POA: Diagnosis not present

## 2022-03-13 DIAGNOSIS — R188 Other ascites: Secondary | ICD-10-CM

## 2022-03-13 DIAGNOSIS — K219 Gastro-esophageal reflux disease without esophagitis: Secondary | ICD-10-CM

## 2022-03-13 DIAGNOSIS — K7460 Unspecified cirrhosis of liver: Secondary | ICD-10-CM | POA: Diagnosis not present

## 2022-03-13 DIAGNOSIS — Z86010 Personal history of colonic polyps: Secondary | ICD-10-CM

## 2022-03-13 DIAGNOSIS — Z8719 Personal history of other diseases of the digestive system: Secondary | ICD-10-CM

## 2022-03-13 NOTE — Telephone Encounter (Signed)
Referral sent 

## 2022-03-13 NOTE — Patient Instructions (Signed)
Please complete the lab when you are able. I am requesting the labs from [facility].  We are arranging an ultrasound of your liver for routine purposes.  We will see you in 2 months!  It was a pleasure to see you today. I want to create trusting relationships with patients to provide genuine, compassionate, and quality care. I value your feedback. If you receive a survey regarding your visit,  I greatly appreciate you taking time to fill this out.   Annitta Needs, PhD, ANP-BC [facility] Gastroenterology

## 2022-03-13 NOTE — Telephone Encounter (Signed)
Pt informed that [place] is scheduled for 03/22/22 at 8:30 am, arrive at 8:15 am, nothing to eat or drink after midnight.

## 2022-03-13 NOTE — Telephone Encounter (Signed)
Please refer to Nephrology. History of cirrhosis, creatinine trending up

## 2022-03-13 NOTE — Progress Notes (Addendum)
Gastroenterology Office Note     Primary Care Physician:  [name], MD  Primary Gastroenterologist: Dr. Jenetta Downer    Chief Complaint   Chief Complaint  Patient presents with   hospital followup    Follow up from 12/05 hospitalization.     History of Present Illness   Daniel Crawford is a 66 y.o. male presenting today in follow-up with a history of  NASH cirrhosis, HE, recently inpatient with decompensation with anasarca. Not a TIPs candidate currently; not a liver transplant candidate.   Discharged home on spironolactone 100 mg daily and furosemide 60 mg BID. Recent labs with PCP, which we will request. Denies tense ascites. Lower extremity edema improved. Low sodium diet. No rectal bleeding. Occasional right-sided abdominal pain. No confusion or mental status changes. BM 2-4 times per day. Continues on Xifaxan BID. Still needs colonoscopy due to polyp in cecum unable to be reached.   Chronically elevated AFP. Needs updated [place].     Last EGD: 11/06/2019 - Normal esophagus. Dilated. - Portal hypertensive gastropathy. - Normal examined duodenum. - No specimens collected.   Last Colonoscopy:06/28/2021  - One medium size sessile polyp in the cecum. This polyp could not be removed because of poor approach. - Five 4 to 7 mm polyps in the transverse colon and at the hepatic flexure, removed with a cold snare. Resected and retrieved. - External and internal hemorrhoids.   Path: All polyps were tubular adenomas    Past Medical History:  Diagnosis Date   Anemia    Anxiety    Asthma    CHF (congestive heart failure) ([place])    CKD (chronic kidney disease)    Depression    Diabetes mellitus without complication ([place])    Dyspnea    Hypertension    Hypothyroidism    Liver cirrhosis secondary to NASH ([place])    NASH (nonalcoholic steatohepatitis)    Pre-diabetes    Spleen enlarged    Thrombocytopenia ([place])     Past Surgical History:  Procedure Laterality Date    Bilateral hernia surgery     2006, 2007   CHOLECYSTECTOMY     2016    COLONOSCOPY WITH PROPOFOL N/A 06/28/2021   Procedure: COLONOSCOPY WITH PROPOFOL;  Surgeon: Rogene Houston, MD;  [facility];  Service: Endoscopy;  Laterality: N/A;  1020   ESOPHAGEAL DILATION N/A 11/06/2019   Procedure: ESOPHAGEAL DILATION;  Surgeon: Harvel Quale, MD;  Location: [facility];  Service: Gastroenterology;  Laterality: N/A;   ESOPHAGOGASTRODUODENOSCOPY (EGD) WITH PROPOFOL N/A 11/06/2019   Procedure: ESOPHAGOGASTRODUODENOSCOPY (EGD) WITH PROPOFOL;  Surgeon: Harvel Quale, MD;  [facility];  Service: Gastroenterology;  Laterality: N/A;  1045   IR RADIOLOGIST EVAL & MGMT  02/19/2022   LAPAROSCOPIC ASSISTED VENTRAL HERNIA REPAIR     Polyp removed     in January of 2018.    POLYPECTOMY  06/28/2021   Procedure: POLYPECTOMY INTESTINAL;  Surgeon: Rogene Houston, MD;  [facility];  Service: Endoscopy;;   RIGHT HEART CATH N/A 01/08/2022   Procedure: RIGHT HEART CATH;  Surgeon: Jettie Booze, MD;  Location: [place] CV LAB;  Service: Cardiovascular;  Laterality: N/A;    Current Outpatient Medications  Medication Sig Dispense Refill   acetaminophen (TYLENOL) 500 MG tablet Take 1,000 mg by mouth every 6 (six) hours as needed for moderate pain.     albuterol (VENTOLIN HFA) 108 (90 Base) MCG/ACT inhaler Inhale 2 puffs  into the lungs every 6 (six) hours as needed for wheezing or shortness of breath. 18 g 2   carvedilol (COREG) 6.25 MG tablet Take 1 tablet (6.25 mg total) by mouth 2 (two) times daily with a meal. 180 tablet 3   furosemide (LASIX) 20 MG tablet Take 3 tablets (60 mg total) by mouth 2 (two) times daily. 180 tablet 11   gabapentin (NEURONTIN) 100 MG capsule Take 100 mg by mouth daily as needed (pain).     hydrOXYzine (VISTARIL) 50 MG capsule Take 1 capsule (50 mg total) by mouth 3 (three) times daily as needed for anxiety or nausea. 90  capsule 2   levothyroxine (SYNTHROID) 175 MCG tablet Take 175 mcg by mouth daily before breakfast.     ondansetron (ZOFRAN) 8 MG tablet Take 8 mg by mouth every 8 (eight) hours as needed for nausea or vomiting.     pantoprazole (PROTONIX) 40 MG tablet Take 1 tablet (40 mg total) by mouth daily. 90 tablet 3   potassium chloride SA (KLOR-CON M) 20 MEQ tablet Take 2 tablets (40 mEq total) by mouth daily. (Patient taking differently: Take 40 mEq by mouth 2 (two) times daily.) 60 tablet 0   rifaximin (XIFAXAN) 550 MG TABS tablet Take 1 tablet (550 mg total) by mouth 2 (two) times daily. 180 tablet 3   sertraline (ZOLOFT) 50 MG tablet Take 1 tablet (50 mg total) by mouth at bedtime. 90 tablet 3   spironolactone (ALDACTONE) 100 MG tablet Take 1 tablet (100 mg total) by mouth daily. 30 tablet 1   traZODone (DESYREL) 50 MG tablet Take 1 tablet (50 mg total) by mouth at bedtime. 90 tablet 3   No current facility-administered medications for this visit.    Allergies as of 03/13/2022   (No Known Allergies)    Family History  Problem Relation Age of Onset   Liver cancer Mother    Heart disease Mother    Aneurysm Sister     Social History   Socioeconomic History   Marital status: Single    Spouse name: Not on file   Number of children: Not on file   Years of education: Not on file   Highest education level: Not on file  Occupational History   Not on file  Tobacco Use   Smoking status: Former    Packs/day: 1.00    Years: 20.00    Total pack years: 20.00    Types: Cigarettes    Quit date: 2017    Years since quitting: 6.9   Smokeless tobacco: Never  Vaping Use   Vaping Use: Never used  Substance and Sexual Activity   Alcohol use: No   Drug use: No   Sexual activity: Not on file  Other Topics Concern   Not on file  Social History Narrative   Not on file   Social Determinants of Health   Financial Resource Strain: Not on file  Food Insecurity: No Food Insecurity (01/23/2022)    Hunger Vital Sign    Worried About Running Out of Food in the Last Year: Never true    Ran Out of Food in the Last Year: Never true  Transportation Needs: No Transportation Needs (01/23/2022)   PRAPARE - Hydrologist (Medical): No    Lack of Transportation (Non-Medical): No  Physical Activity: Not on file  Stress: Not on file  Social Connections: Not on file  Intimate Partner Violence: Not At Risk (01/23/2022)   Humiliation, Afraid,  Rape, and Kick questionnaire    Fear of Current or Ex-Partner: No    Emotionally Abused: No    Physically Abused: No    Sexually Abused: No     Review of Systems   Gen: Denies any fever, chills, fatigue, weight loss, lack of appetite.  CV: Denies chest pain, heart palpitations, peripheral edema, syncope.  Resp: Denies shortness of breath at rest or with exertion. Denies wheezing or cough.  GI: see HPI GU : Denies urinary burning, urinary frequency, urinary hesitancy MS: Denies joint pain, muscle weakness, cramps, or limitation of movement.  Derm: Denies rash, itching, dry skin Psych: Denies depression, anxiety, memory loss, and confusion Heme: Denies bruising, bleeding, and enlarged lymph nodes.   Physical Exam   BP 132/77 (BP Location: Right Arm, Patient Position: Sitting, Cuff Size: Large)   Pulse 73   Temp 99.5 F (37.5 C) (Oral)   Ht '6\' 2"'$  (1.88 m)   Wt (!) 365 lb 6.4 oz (165.7 kg)   SpO2 94%   BMI 46.91 kg/m  General:   Alert and oriented. Pleasant and cooperative. Well-nourished and well-developed.  Head:  Normocephalic and atraumatic. Eyes:  Without icterus Abdomen:  Declined to get on the exam table. Limited exam. +BS, soft, obese, non-tender. Umbilical hernia.  Rectal:  Deferred  Msk:  Symmetrical without gross deformities. Normal posture. Extremities:  With pedal and ankle edema  Neurologic:  Alert and  oriented x4 Skin:  Intact without significant lesions or rashes. Psych:  Alert and cooperative.  Normal mood and affect.   Assessment   Daniel Crawford is a 66 y.o. male presenting today in follow-up with a history of NASH cirrhosis, HE, recently inpatient with decompensation with anasarca. Not a TIPs candidate currently; not a liver transplant candidate.   NASH cirrhosis with decompensation: overall guarded prognosis. Not a TIPS candidate or transplant candidate. Multimorbidity contributing. He notes he is on spironolactone 100 mg daily and furosemide 60 mg BID. Recent labs with PCP, which we will request. Will update RUQ [place] and AFP. Consider MRI of liver due to mildly elevated AFP. No HE currently.   Will need EGD in near future once he has stabilized. Last EGD in 2021 with portal gastropathy. Colonoscopy also recommended as polyp was unable to be reached in cecum.     PLAN    Continue Xifaxan BID. Holding off on lactulose as he has 2-4 stools a day Continue PPI daily Will request outside PCP labs: need to make sure tolerating diuretics AFP  Consider MRI Colonoscopy/EGD in near future once he has stabilized from fluid overload standpoint. Continues to improve Close follow-up in 2 months Low sodium diet Refer to Nephrology due to creatinine trend    Annitta Needs, PhD, ANP-BC [facility] Gastroenterology   I have reviewed the note and agree with the APP's assessment as described in this progress note  If more euvolemic can schedule for EGD and colonoscopy as previously intended. Notably, will request labs to determine if Cr has been more stable. Can uptitrate diuretics if still with third spacing and with stable creatinine  [name], MD Gastroenterology and [place] [facility]

## 2022-03-22 ENCOUNTER — Ambulatory Visit (HOSPITAL_COMMUNITY)
Admission: RE | Admit: 2022-03-22 | Discharge: 2022-03-22 | Disposition: A | Discharge status: Home / Self Care | Payer: Medicare HMO | Source: Ambulatory Visit | Attending: Gastroenterology | Admitting: Gastroenterology

## 2022-03-22 DIAGNOSIS — K7460 Unspecified cirrhosis of liver: Secondary | ICD-10-CM | POA: Insufficient documentation

## 2022-03-22 DIAGNOSIS — R188 Other ascites: Secondary | ICD-10-CM | POA: Diagnosis not present

## 2022-03-22 DIAGNOSIS — I10 Essential (primary) hypertension: Secondary | ICD-10-CM | POA: Diagnosis not present

## 2022-03-22 DIAGNOSIS — R161 Splenomegaly, not elsewhere classified: Secondary | ICD-10-CM | POA: Diagnosis not present

## 2022-03-22 DIAGNOSIS — Z9049 Acquired absence of other specified parts of digestive tract: Secondary | ICD-10-CM | POA: Diagnosis not present

## 2022-03-22 LAB — BASIC METABOLIC PANEL
BUN: 18 mg/dL (ref 7–25)
CO2: 26 mmol/L (ref 20–32)
Calcium: 9 mg/dL (ref 8.6–10.3)
Chloride: 102 mmol/L (ref 98–110)
Creat: 1.18 mg/dL (ref 0.70–1.35)
Glucose, Bld: 123 mg/dL — ABNORMAL HIGH (ref 65–99)
Potassium: 4.3 mmol/L (ref 3.5–5.3)
Sodium: 136 mmol/L (ref 135–146)

## 2022-03-28 ENCOUNTER — Ambulatory Visit: Payer: Medicare HMO | Admitting: "Endocrinology

## 2022-03-29 ENCOUNTER — Telehealth: Payer: Self-pay | Admitting: Gastroenterology

## 2022-03-29 NOTE — Telephone Encounter (Signed)
Pt is calling wanting results of his labs and ultrasound. Please advise

## 2022-03-29 NOTE — Telephone Encounter (Signed)
Patient left a message saying he was wanting to get the results from blood work and an ultrasound that he had in December

## 2022-03-30 NOTE — Telephone Encounter (Signed)
Pt called again for his results. Please advise

## 2022-03-30 NOTE — Telephone Encounter (Signed)
error 

## 2022-04-03 NOTE — Telephone Encounter (Signed)
Please see result notes.  

## 2022-04-03 NOTE — Telephone Encounter (Signed)
Noted Please see result note

## 2022-04-04 ENCOUNTER — Telehealth (INDEPENDENT_AMBULATORY_CARE_PROVIDER_SITE_OTHER): Payer: Self-pay

## 2022-04-04 NOTE — Telephone Encounter (Signed)
This has already been taken care of

## 2022-04-04 NOTE — Telephone Encounter (Signed)
Patient returned call 04/03/2022 for his results from 03/22/2022 lab work and Imaging. I noticed you had tried to call the patient. I am unable to get to the message that Vicente Males had asked that be reported to the patient. He asked that you please return call to him at [phone]. Thanks

## 2022-04-05 ENCOUNTER — Telehealth (INDEPENDENT_AMBULATORY_CARE_PROVIDER_SITE_OTHER): Payer: Self-pay | Admitting: *Deleted

## 2022-04-05 NOTE — Telephone Encounter (Signed)
See result note.  

## 2022-04-05 NOTE — Telephone Encounter (Signed)
Thanks, Abigail Butts. I agree.

## 2022-04-05 NOTE — Telephone Encounter (Signed)
Patient called office and told me he was miserable and sob with fluid build up. Having to use bathroom every 10 mins with the amount of fluid pills he is taking - lasix '80mg'$  and spironolactone '100mg'$ . He told me he was going to ED this afternoon or in the morning as soon as he thought he could get there. I did advise if he was miserable and sob he should go get seen today and he told me he would go to aph.

## 2022-04-06 ENCOUNTER — Inpatient Hospital Stay (HOSPITAL_COMMUNITY)
Admission: EM | Admit: 2022-04-06 | Discharge: 2022-04-10 | DRG: 433 | Disposition: A | Discharge status: Home / Self Care | Payer: Medicare HMO | Attending: Internal Medicine | Admitting: Internal Medicine

## 2022-04-06 ENCOUNTER — Other Ambulatory Visit: Payer: Self-pay

## 2022-04-06 ENCOUNTER — Emergency Department (HOSPITAL_COMMUNITY): Payer: Medicare HMO

## 2022-04-06 DIAGNOSIS — Z79890 Hormone replacement therapy (postmenopausal): Secondary | ICD-10-CM | POA: Diagnosis not present

## 2022-04-06 DIAGNOSIS — Z8249 Family history of ischemic heart disease and other diseases of the circulatory system: Secondary | ICD-10-CM

## 2022-04-06 DIAGNOSIS — E039 Hypothyroidism, unspecified: Secondary | ICD-10-CM | POA: Diagnosis present

## 2022-04-06 DIAGNOSIS — R54 Age-related physical debility: Secondary | ICD-10-CM | POA: Diagnosis present

## 2022-04-06 DIAGNOSIS — R140 Abdominal distension (gaseous): Secondary | ICD-10-CM | POA: Diagnosis present

## 2022-04-06 DIAGNOSIS — Z6841 Body Mass Index (BMI) 40.0 and over, adult: Secondary | ICD-10-CM | POA: Diagnosis not present

## 2022-04-06 DIAGNOSIS — D72819 Decreased white blood cell count, unspecified: Secondary | ICD-10-CM | POA: Diagnosis not present

## 2022-04-06 DIAGNOSIS — E119 Type 2 diabetes mellitus without complications: Secondary | ICD-10-CM | POA: Diagnosis present

## 2022-04-06 DIAGNOSIS — M7989 Other specified soft tissue disorders: Secondary | ICD-10-CM | POA: Diagnosis not present

## 2022-04-06 DIAGNOSIS — K7581 Nonalcoholic steatohepatitis (NASH): Secondary | ICD-10-CM | POA: Diagnosis present

## 2022-04-06 DIAGNOSIS — Z7189 Other specified counseling: Secondary | ICD-10-CM | POA: Diagnosis not present

## 2022-04-06 DIAGNOSIS — R0602 Shortness of breath: Secondary | ICD-10-CM | POA: Diagnosis not present

## 2022-04-06 DIAGNOSIS — I5032 Chronic diastolic (congestive) heart failure: Secondary | ICD-10-CM | POA: Diagnosis not present

## 2022-04-06 DIAGNOSIS — K219 Gastro-esophageal reflux disease without esophagitis: Secondary | ICD-10-CM | POA: Diagnosis present

## 2022-04-06 DIAGNOSIS — Z87891 Personal history of nicotine dependence: Secondary | ICD-10-CM

## 2022-04-06 DIAGNOSIS — I10 Essential (primary) hypertension: Secondary | ICD-10-CM | POA: Diagnosis present

## 2022-04-06 DIAGNOSIS — E876 Hypokalemia: Secondary | ICD-10-CM | POA: Diagnosis not present

## 2022-04-06 DIAGNOSIS — Z79899 Other long term (current) drug therapy: Secondary | ICD-10-CM | POA: Diagnosis not present

## 2022-04-06 DIAGNOSIS — D61818 Other pancytopenia: Secondary | ICD-10-CM | POA: Diagnosis not present

## 2022-04-06 DIAGNOSIS — Z1152 Encounter for screening for COVID-19: Secondary | ICD-10-CM | POA: Diagnosis not present

## 2022-04-06 DIAGNOSIS — R601 Generalized edema: Secondary | ICD-10-CM | POA: Diagnosis not present

## 2022-04-06 DIAGNOSIS — Z515 Encounter for palliative care: Secondary | ICD-10-CM | POA: Diagnosis not present

## 2022-04-06 DIAGNOSIS — J45909 Unspecified asthma, uncomplicated: Secondary | ICD-10-CM | POA: Diagnosis not present

## 2022-04-06 DIAGNOSIS — I110 Hypertensive heart disease with heart failure: Secondary | ICD-10-CM | POA: Diagnosis not present

## 2022-04-06 DIAGNOSIS — F32A Depression, unspecified: Secondary | ICD-10-CM | POA: Diagnosis present

## 2022-04-06 DIAGNOSIS — R079 Chest pain, unspecified: Secondary | ICD-10-CM | POA: Diagnosis present

## 2022-04-06 DIAGNOSIS — Z800 Family history of malignant neoplasm of digestive organs: Secondary | ICD-10-CM | POA: Diagnosis not present

## 2022-04-06 DIAGNOSIS — R69 Illness, unspecified: Secondary | ICD-10-CM | POA: Diagnosis not present

## 2022-04-06 DIAGNOSIS — K7460 Unspecified cirrhosis of liver: Principal | ICD-10-CM | POA: Diagnosis present

## 2022-04-06 DIAGNOSIS — D696 Thrombocytopenia, unspecified: Secondary | ICD-10-CM | POA: Diagnosis present

## 2022-04-06 DIAGNOSIS — D709 Neutropenia, unspecified: Secondary | ICD-10-CM | POA: Diagnosis not present

## 2022-04-06 DIAGNOSIS — F418 Other specified anxiety disorders: Secondary | ICD-10-CM | POA: Diagnosis present

## 2022-04-06 DIAGNOSIS — I509 Heart failure, unspecified: Secondary | ICD-10-CM | POA: Diagnosis not present

## 2022-04-06 DIAGNOSIS — F419 Anxiety disorder, unspecified: Secondary | ICD-10-CM | POA: Diagnosis present

## 2022-04-06 LAB — CBC
HCT: 33.7 % — ABNORMAL LOW (ref 39.0–52.0)
Hemoglobin: 10.9 g/dL — ABNORMAL LOW (ref 13.0–17.0)
MCH: 29.4 pg (ref 26.0–34.0)
MCHC: 32.3 g/dL (ref 30.0–36.0)
MCV: 90.8 fL (ref 80.0–100.0)
Platelets: 65 10*3/uL — ABNORMAL LOW (ref 150–400)
RBC: 3.71 MIL/uL — ABNORMAL LOW (ref 4.22–5.81)
RDW: 17.2 % — ABNORMAL HIGH (ref 11.5–15.5)
WBC: 2.8 10*3/uL — ABNORMAL LOW (ref 4.0–10.5)
nRBC: 0 % (ref 0.0–0.2)

## 2022-04-06 LAB — BASIC METABOLIC PANEL
Anion gap: 8 (ref 5–15)
BUN: 15 mg/dL (ref 8–23)
CO2: 25 mmol/L (ref 22–32)
Calcium: 8.3 mg/dL — ABNORMAL LOW (ref 8.9–10.3)
Chloride: 102 mmol/L (ref 98–111)
Creatinine, Ser: 1.19 mg/dL (ref 0.61–1.24)
GFR, Estimated: >60 mL/min (ref >60)
Glucose, Bld: 189 mg/dL — ABNORMAL HIGH (ref 70–99)
Potassium: 3.9 mmol/L (ref 3.5–5.1)
Sodium: 135 mmol/L (ref 135–145)

## 2022-04-06 LAB — TROPONIN I (HIGH SENSITIVITY)
Troponin I (High Sensitivity): 5 ng/L (ref <18)
Troponin I (High Sensitivity): 6 ng/L (ref <18)

## 2022-04-06 LAB — BRAIN NATRIURETIC PEPTIDE: B Natriuretic Peptide: 37 pg/mL (ref 0.0–100.0)

## 2022-04-06 MED ORDER — FUROSEMIDE 10 MG/ML IJ SOLN
40.0000 mg | Freq: Once | INTRAMUSCULAR | Status: AC
  Administered 2022-04-06: 40 mg via INTRAVENOUS
  Filled 2022-04-06: qty 4

## 2022-04-06 NOTE — ED Notes (Signed)
Emptied pts urinal; 1000 mL of urine noted in urinal prior to emptying

## 2022-04-06 NOTE — ED Provider Notes (Signed)
[facility] EMERGENCY DEPARTMENT Provider Note   CSN: [id number] Arrival date & time: 04/06/22  1537     History  Chief Complaint  Patient presents with   Shortness of Breath   Chest Pain    Daniel Crawford is a 67 y.o. male.   Shortness of Breath Associated symptoms: chest pain   Chest Pain Associated symptoms: shortness of breath      Patient with medical history of anxiety, asthma, cirrhosis, CHF, diabetes, morbid obesity presents to the emergency department due to shortness breath and lower extremity swelling.  He is been feeling short of breath for the last 3 days at rest but worse with any exertion.  He is also having worsening lower extremity swelling.  Denies chest pain, abdominal pain or swelling, lateralized weakness or numbness.  States he is on 100 mg of spironolactone and 40 mg of Lasix at home but he is still having weight and has no extremity swelling.  Home Medications Prior to Admission medications   Medication Sig Start Date End Date Taking? Authorizing Provider  acetaminophen (TYLENOL) 500 MG tablet Take 1,000 mg by mouth every 6 (six) hours as needed for moderate pain.    [provider]  albuterol (VENTOLIN HFA) 108 (90 Base) MCG/ACT inhaler Inhale 2 puffs into the lungs every 6 (six) hours as needed for wheezing or shortness of breath. 03/04/22   Johnson, Clanford L, MD  carvedilol (COREG) 6.25 MG tablet Take 1 tablet (6.25 mg total) by mouth 2 (two) times daily with a meal. 01/25/22   Emokpae, Courage, MD  furosemide (LASIX) 20 MG tablet Take 3 tablets (60 mg total) by mouth 2 (two) times daily. 01/25/22 01/25/23  Roxan Hockey, MD  gabapentin (NEURONTIN) 100 MG capsule Take 100 mg by mouth daily as needed (pain). 02/20/21   [provider]  hydrOXYzine (VISTARIL) 50 MG capsule Take 1 capsule (50 mg total) by mouth 3 (three) times daily as needed for anxiety or nausea. 01/25/22   Roxan Hockey, MD  levothyroxine (SYNTHROID) 175 MCG tablet Take  175 mcg by mouth daily before breakfast.    [provider]  metolazone (ZAROXOLYN) 5 MG tablet Take 5 mg by mouth every other day. 03/03/22   [provider]  ondansetron (ZOFRAN) 8 MG tablet Take 8 mg by mouth every 8 (eight) hours as needed for nausea or vomiting.    [provider]  pantoprazole (PROTONIX) 40 MG tablet Take 1 tablet (40 mg total) by mouth daily. 01/25/22   Roxan Hockey, MD  potassium chloride SA (KLOR-CON M) 20 MEQ tablet Take 2 tablets (40 mEq total) by mouth daily. Patient taking differently: Take 40 mEq by mouth 2 (two) times daily. 03/05/22   Johnson, Clanford L, MD  rifaximin (XIFAXAN) 550 MG TABS tablet Take 1 tablet (550 mg total) by mouth 2 (two) times daily. 01/25/22   Roxan Hockey, MD  sertraline (ZOLOFT) 50 MG tablet Take 1 tablet (50 mg total) by mouth at bedtime. 01/25/22   Roxan Hockey, MD  spironolactone (ALDACTONE) 100 MG tablet Take 1 tablet (100 mg total) by mouth daily. 03/04/22   Johnson, Clanford L, MD  traZODone (DESYREL) 50 MG tablet Take 1 tablet (50 mg total) by mouth at bedtime. 01/25/22   Roxan Hockey, MD      Allergies    Patient has no known allergies.    Review of Systems   Review of Systems  Respiratory:  Positive for shortness of breath.   Cardiovascular:  Positive for  chest pain.    Physical Exam Updated Vital Signs BP (!) 147/73 Comment: Simultaneous filing. User Sutch not have seen previous data.  Pulse 79 Comment: Simultaneous filing. User Guia not have seen previous data.  Temp 97.6 F (36.4 C) (Oral)   Resp 18   Ht '6\' 2"'$  (1.88 m)   Wt (!) 163.3 kg   SpO2 98% Comment: Simultaneous filing. User Oquin not have seen previous data.  BMI 46.22 kg/m  Physical Exam Vitals and nursing note reviewed. Exam conducted with a chaperone present.  Constitutional:      Appearance: Normal appearance. He is ill-appearing.  HENT:     Head: Normocephalic and atraumatic.  Eyes:     General: No scleral  icterus.       Right eye: No discharge.        Left eye: No discharge.     Extraocular Movements: Extraocular movements intact.     Pupils: Pupils are equal, round, and reactive to light.  Cardiovascular:     Rate and Rhythm: Normal rate and regular rhythm.     Pulses: Normal pulses.     Heart sounds: Normal heart sounds. No murmur heard.    No friction rub. No gallop.  Pulmonary:     Effort: Pulmonary effort is normal. Tachypnea present. No respiratory distress.     Comments: Lung sounds slightly diminished at the lower lobes, mildly tachypneic Abdominal:     General: Abdomen is flat. Bowel sounds are normal. There is no distension.     Palpations: Abdomen is soft.     Tenderness: There is no abdominal tenderness.  Musculoskeletal:     Right lower leg: Edema present.     Left lower leg: Edema present.     Comments: Pitting lower extremity edema bilaterally  Skin:    General: Skin is warm and dry.     Coloration: Skin is not jaundiced.  Neurological:     Mental Status: He is alert. Mental status is at baseline.     Coordination: Coordination normal.     ED Results / Procedures / Treatments   Labs (all labs ordered are listed, but only abnormal results are displayed) Labs Reviewed  BASIC METABOLIC PANEL - Abnormal; Notable for the following components:      Result Value   Glucose, Bld 189 (*)    Calcium 8.3 (*)    All other components within normal limits  CBC - Abnormal; Notable for the following components:   WBC 2.8 (*)    RBC 3.71 (*)    Hemoglobin 10.9 (*)    HCT 33.7 (*)    RDW 17.2 (*)    Platelets 65 (*)    All other components within normal limits  BRAIN NATRIURETIC PEPTIDE  TROPONIN I (HIGH SENSITIVITY)  TROPONIN I (HIGH SENSITIVITY)    EKG EKG Interpretation  Date/Time:  Friday April 06 2022 15:48:33 EST Ventricular Rate:  91 PR Interval:  190 QRS Duration: 84 QT Interval:  388 QTC Calculation: 477 R Axis:   105 Text Interpretation: Normal  sinus rhythm Rightward axis Cannot rule out Anterior infarct , age undetermined Abnormal ECG When compared with ECG of 27-Feb-2022 18:30, PREVIOUS ECG IS PRESENT Confirmed by Noemi Chapel [phone]) on 04/06/2022 3:55:07 PM  Radiology DG Chest 2 View  Result Date: 04/06/2022 CLINICAL DATA:  Shortness of breath EXAM: CHEST - 2 VIEW COMPARISON:  02/27/2022 FINDINGS: Stable cardiomediastinal contours. Mildly increased bibasilar interstitial markings. No pleural effusion or pneumothorax. IMPRESSION: Mildly increased bibasilar interstitial  markings, which [place] reflect atelectasis or developing infiltrate. Electronically Signed   By: Davina Poke D.O.   On: 04/06/2022 16:08    Procedures Procedures    Medications Ordered in ED Medications  furosemide (LASIX) injection 40 mg (40 mg Intravenous Given 04/06/22 1712)    ED Course/ Medical Decision Making/ A&P Clinical Course as of 04/06/22 1849  Fri Apr 06, 2022  1628 DG Chest 2 View Atelectasis versus early infiltrates [HS]    Clinical Course User Index [HS] Sherrill Raring, PA-C                             Medical Decision Making Amount and/or Complexity of Data Reviewed Labs: ordered. Radiology: ordered. Decision-making details documented in ED Course.  Risk Prescription drug management. Decision regarding hospitalization.   Patient presents to the emergency department due to feeling short of breath.  Frontal includes not limited to ACS, PE, pneumonia, CHF exacerbation.  Upper and lower extremity pulses 2+ symmetric bilaterally.  Pitting lower extremity edema bilaterally.  Slightly diminished lung sounds but did not appreciate any rales.  He is not hypoxic, not tachycardic but very mildly tachypneic.  I ordered, viewed and interpreted laboratory workup.  CBC shows pancytopenia which is baseline per chart review.  No elevation to BNP, within normal limits.  BMP without gross electrolyte abnormality or derangement, troponin low at 5.  Will  collect second troponin.  Consider sending the patient home given the laboratory workup thus far has been relatively reassuring.  However, patient is gaining weight at home, he is already on Lasix and spironolactone with worsening lower extremity edema.  Discussed with attending who agrees it is reasonable to bring patient in for IV Lasix, I will consult hospitalist.  First dose of 40 mg of Lasix ordered here in the ED.        Final Clinical Impression(s) / ED Diagnoses Final diagnoses:  Acute on chronic congestive heart failure, unspecified heart failure type [facility])    Rx / DC Orders ED Discharge Orders     None         Sherrill Raring, Hershal Coria 04/06/22 1849    Noemi Chapel, MD 04/07/22 1432

## 2022-04-06 NOTE — ED Triage Notes (Signed)
Pt to ED c/o SHOB x 3 days, progressively getting worse, Worse with exertion. HX CHF, Bilat lower extremity edema noted.

## 2022-04-07 DIAGNOSIS — I10 Essential (primary) hypertension: Secondary | ICD-10-CM | POA: Diagnosis not present

## 2022-04-07 DIAGNOSIS — D72819 Decreased white blood cell count, unspecified: Secondary | ICD-10-CM

## 2022-04-07 DIAGNOSIS — F32A Depression, unspecified: Secondary | ICD-10-CM

## 2022-04-07 DIAGNOSIS — K219 Gastro-esophageal reflux disease without esophagitis: Secondary | ICD-10-CM | POA: Diagnosis not present

## 2022-04-07 DIAGNOSIS — E039 Hypothyroidism, unspecified: Secondary | ICD-10-CM

## 2022-04-07 DIAGNOSIS — D696 Thrombocytopenia, unspecified: Secondary | ICD-10-CM | POA: Diagnosis not present

## 2022-04-07 DIAGNOSIS — D708 Other neutropenia: Secondary | ICD-10-CM

## 2022-04-07 DIAGNOSIS — K7581 Nonalcoholic steatohepatitis (NASH): Secondary | ICD-10-CM | POA: Diagnosis not present

## 2022-04-07 DIAGNOSIS — K7460 Unspecified cirrhosis of liver: Secondary | ICD-10-CM

## 2022-04-07 LAB — CBC
HCT: 31.2 % — ABNORMAL LOW (ref 39.0–52.0)
Hemoglobin: 10.2 g/dL — ABNORMAL LOW (ref 13.0–17.0)
MCH: 29.2 pg (ref 26.0–34.0)
MCHC: 32.7 g/dL (ref 30.0–36.0)
MCV: 89.4 fL (ref 80.0–100.0)
Platelets: 61 10*3/uL — ABNORMAL LOW (ref 150–400)
RBC: 3.49 MIL/uL — ABNORMAL LOW (ref 4.22–5.81)
RDW: 17.3 % — ABNORMAL HIGH (ref 11.5–15.5)
WBC: 2.8 10*3/uL — ABNORMAL LOW (ref 4.0–10.5)
nRBC: 0 % (ref 0.0–0.2)

## 2022-04-07 LAB — COMPREHENSIVE METABOLIC PANEL
ALT: 21 U/L (ref 0–44)
AST: 49 U/L — ABNORMAL HIGH (ref 15–41)
Albumin: 3 g/dL — ABNORMAL LOW (ref 3.5–5.0)
Alkaline Phosphatase: 96 U/L (ref 38–126)
Anion gap: 8 (ref 5–15)
BUN: 14 mg/dL (ref 8–23)
CO2: 26 mmol/L (ref 22–32)
Calcium: 8.2 mg/dL — ABNORMAL LOW (ref 8.9–10.3)
Chloride: 104 mmol/L (ref 98–111)
Creatinine, Ser: 1.09 mg/dL (ref 0.61–1.24)
GFR, Estimated: >60 mL/min (ref >60)
Glucose, Bld: 112 mg/dL — ABNORMAL HIGH (ref 70–99)
Potassium: 3.4 mmol/L — ABNORMAL LOW (ref 3.5–5.1)
Sodium: 138 mmol/L (ref 135–145)
Total Bilirubin: 3.2 mg/dL — ABNORMAL HIGH (ref 0.3–1.2)
Total Protein: 6.1 g/dL — ABNORMAL LOW (ref 6.5–8.1)

## 2022-04-07 LAB — PHOSPHORUS: Phosphorus: 3.3 mg/dL (ref 2.5–4.6)

## 2022-04-07 LAB — MAGNESIUM: Magnesium: 2.1 mg/dL (ref 1.7–2.4)

## 2022-04-07 LAB — PROCALCITONIN: Procalcitonin: 0.26 ng/mL

## 2022-04-07 MED ORDER — FUROSEMIDE 10 MG/ML IJ SOLN
40.0000 mg | Freq: Every day | INTRAMUSCULAR | Status: DC
  Administered 2022-04-07 – 2022-04-08 (×2): 40 mg via INTRAVENOUS
  Filled 2022-04-07 (×2): qty 4

## 2022-04-07 MED ORDER — PROCHLORPERAZINE EDISYLATE 10 MG/2ML IJ SOLN: 10.0000 mg | Freq: Four times a day (QID) | INTRAMUSCULAR | Status: DC | PRN

## 2022-04-07 MED ORDER — SERTRALINE HCL 50 MG PO TABS
50.0000 mg | ORAL_TABLET | Freq: Every day | ORAL | Status: DC
  Administered 2022-04-07 – 2022-04-09 (×3): 50 mg via ORAL
  Filled 2022-04-07 (×3): qty 1

## 2022-04-07 MED ORDER — HYDROXYZINE PAMOATE 50 MG PO CAPS: 50.0000 mg | ORAL_CAPSULE | Freq: Three times a day (TID) | ORAL | Status: DC | PRN

## 2022-04-07 MED ORDER — PANTOPRAZOLE SODIUM 40 MG PO TBEC
40.0000 mg | DELAYED_RELEASE_TABLET | Freq: Every day | ORAL | Status: DC
  Administered 2022-04-07 – 2022-04-10 (×4): 40 mg via ORAL
  Filled 2022-04-07 (×4): qty 1

## 2022-04-07 MED ORDER — HYDROXYZINE HCL 25 MG PO TABS: 50.0000 mg | ORAL_TABLET | Freq: Three times a day (TID) | ORAL | Status: DC | PRN

## 2022-04-07 MED ORDER — LEVOTHYROXINE SODIUM 75 MCG PO TABS
175.0000 ug | ORAL_TABLET | Freq: Every day | ORAL | Status: DC
  Administered 2022-04-07 – 2022-04-10 (×4): 175 ug via ORAL
  Filled 2022-04-07 (×4): qty 1

## 2022-04-07 MED ORDER — OXYCODONE HCL 5 MG PO TABS
5.0000 mg | ORAL_TABLET | Freq: Once | ORAL | Status: AC
  Administered 2022-04-07: 5 mg via ORAL
  Filled 2022-04-07: qty 1

## 2022-04-07 MED ORDER — SPIRONOLACTONE 25 MG PO TABS
100.0000 mg | ORAL_TABLET | Freq: Every day | ORAL | Status: DC
  Administered 2022-04-07 – 2022-04-10 (×4): 100 mg via ORAL
  Filled 2022-04-07 (×4): qty 4

## 2022-04-07 MED ORDER — POTASSIUM CHLORIDE CRYS ER 20 MEQ PO TBCR
20.0000 meq | EXTENDED_RELEASE_TABLET | Freq: Once | ORAL | Status: AC
  Administered 2022-04-07: 20 meq via ORAL
  Filled 2022-04-07: qty 1

## 2022-04-07 MED ORDER — GABAPENTIN 100 MG PO CAPS
100.0000 mg | ORAL_CAPSULE | Freq: Every day | ORAL | Status: DC | PRN
  Administered 2022-04-07 – 2022-04-09 (×3): 100 mg via ORAL
  Filled 2022-04-07 (×3): qty 1

## 2022-04-07 MED ORDER — ONDANSETRON HCL 4 MG/2ML IJ SOLN
4.0000 mg | Freq: Four times a day (QID) | INTRAMUSCULAR | Status: DC | PRN
  Administered 2022-04-09: 4 mg via INTRAVENOUS
  Filled 2022-04-07: qty 2

## 2022-04-07 MED ORDER — RIFAXIMIN 550 MG PO TABS
550.0000 mg | ORAL_TABLET | Freq: Two times a day (BID) | ORAL | Status: DC
  Administered 2022-04-07 – 2022-04-10 (×7): 550 mg via ORAL
  Filled 2022-04-07 (×7): qty 1

## 2022-04-07 MED ORDER — ENOXAPARIN SODIUM 40 MG/0.4ML IJ SOSY: 40.0000 mg | PREFILLED_SYRINGE | INTRAMUSCULAR | Status: DC

## 2022-04-07 MED ORDER — ONDANSETRON HCL 4 MG PO TABS: 4.0000 mg | ORAL_TABLET | Freq: Four times a day (QID) | ORAL | Status: DC | PRN

## 2022-04-07 NOTE — H&P (Addendum)
History and Physical    Patient: Daniel Crawford ZES:[id number] DOB: [birth year] DOA: 04/06/2022 DOS: the patient was seen and examined on 04/07/2022 PCP: [facility]  Patient coming from: Home  Chief Complaint:  Chief Complaint  Patient presents with   Shortness of Breath   Chest Pain   HPI: Daniel Crawford is a 67 y.o. male with medical history significant of  type 2 diabetes mellitus, hypertension, hypothyroidism, CHF, cirrhosis due to NASH and thrombocytopenia who presents to the emergency department due to 1 month history of increasing leg swelling and shortness of breath which rapidly worsened within the last 3 days.  Shortness of breath worsens with exertion, he denies fever, chills, chest pain, nausea, vomiting, abdominal pain.  Patient states that he has been compliant with home Lasix 40 mg and spironolactone 100 mg, but he continued to have increased leg swelling. He was admitted from 12/5 to 03/04/2022 with similar presentation and was diagnosed with [place]/cirrhosis decompensated with anasarca was treated with IV Lasix with good diuresis.  ED Course:  In the emergency department, temperature was 97.6 F, BP 145/73 and other vital signs are within normal range.  Workup in the ED showed leukopenia, thrombocytopenia and normocytic anemia.  BMP was normal except for blood glucose of 189, troponin x 2 was normal, BNP 37.0 Chest x-ray showed mildly increased bibasilar interstitial markings, which [place] reflect atelectasis or developing infiltrate. Patient was treated with IV Lasix 40 mg x 1.  Hospitalist was asked to admit patient for further evaluation and management.   Review of Systems: Review of systems as noted in the HPI. All other systems reviewed and are negative.   Past Medical History:  Diagnosis Date   Anemia    Anxiety    Asthma    CHF (congestive heart failure) ([place])    CKD (chronic kidney disease)    Depression    Diabetes mellitus without complication ([place])     Dyspnea    Hypertension    Hypothyroidism    Liver cirrhosis secondary to NASH ([place])    NASH (nonalcoholic steatohepatitis)    Pre-diabetes    Spleen enlarged    Thrombocytopenia ([place])    Past Surgical History:  Procedure Laterality Date   Bilateral hernia surgery     2006, 2007   CHOLECYSTECTOMY     2016    COLONOSCOPY WITH PROPOFOL N/A 06/28/2021   Procedure: COLONOSCOPY WITH PROPOFOL;  Surgeon: Rogene Houston, MD;  [facility];  Service: Endoscopy;  Laterality: N/A;  1020   ESOPHAGEAL DILATION N/A 11/06/2019   Procedure: ESOPHAGEAL DILATION;  Surgeon: Harvel Quale, MD;  Location: [facility];  Service: Gastroenterology;  Laterality: N/A;   ESOPHAGOGASTRODUODENOSCOPY (EGD) WITH PROPOFOL N/A 11/06/2019   Procedure: ESOPHAGOGASTRODUODENOSCOPY (EGD) WITH PROPOFOL;  Surgeon: Harvel Quale, MD;  [facility];  Service: Gastroenterology;  Laterality: N/A;  1045   IR RADIOLOGIST EVAL & MGMT  02/19/2022   LAPAROSCOPIC ASSISTED VENTRAL HERNIA REPAIR     Polyp removed     in January of 2018.    POLYPECTOMY  06/28/2021   Procedure: POLYPECTOMY INTESTINAL;  Surgeon: Rogene Houston, MD;  [facility];  Service: Endoscopy;;   RIGHT HEART CATH N/A 01/08/2022   Procedure: RIGHT HEART CATH;  Surgeon: Jettie Booze, MD;  Location: [facility];  Service: Cardiovascular;  Laterality: N/A;    Social History:  reports that he quit smoking about 7 years ago. His smoking use included  cigarettes. He has a 20.00 pack-year smoking history. He has never used smokeless tobacco. He reports that he does not drink alcohol and does not use drugs.   No Known Allergies  Family History  Problem Relation Age of Onset   Liver cancer Mother    Heart disease Mother    Aneurysm Sister      Prior to Admission medications   Medication Sig Start Date End Date Taking? Authorizing Provider  acetaminophen (TYLENOL) 500 MG tablet Take  1,000 mg by mouth every 6 (six) hours as needed for moderate pain.   Yes [provider]  albuterol (VENTOLIN HFA) 108 (90 Base) MCG/ACT inhaler Inhale 2 puffs into the lungs every 6 (six) hours as needed for wheezing or shortness of breath. 03/04/22  Yes Johnson, Clanford L, MD  carvedilol (COREG) 6.25 MG tablet Take 1 tablet (6.25 mg total) by mouth 2 (two) times daily with a meal. 01/25/22  Yes Emokpae, Courage, MD  furosemide (LASIX) 40 MG tablet Take 40 mg by mouth daily.   Yes [provider]  gabapentin (NEURONTIN) 100 MG capsule Take 100 mg by mouth daily as needed (pain). 02/20/21  Yes [provider]  hydrOXYzine (VISTARIL) 50 MG capsule Take 1 capsule (50 mg total) by mouth 3 (three) times daily as needed for anxiety or nausea. 01/25/22  Yes Emokpae, Courage, MD  levothyroxine (SYNTHROID) 175 MCG tablet Take 175 mcg by mouth daily before breakfast.   Yes [provider]  pantoprazole (PROTONIX) 40 MG tablet Take 1 tablet (40 mg total) by mouth daily. 01/25/22  Yes Emokpae, Courage, MD  potassium chloride SA (KLOR-CON M) 20 MEQ tablet Take 2 tablets (40 mEq total) by mouth daily. Patient taking differently: Take 40 mEq by mouth 2 (two) times daily. 03/05/22  Yes Johnson, Clanford L, MD  sertraline (ZOLOFT) 50 MG tablet Take 1 tablet (50 mg total) by mouth at bedtime. 01/25/22  Yes Emokpae, Courage, MD  spironolactone (ALDACTONE) 100 MG tablet Take 1 tablet (100 mg total) by mouth daily. 03/04/22  Yes Johnson, Clanford L, MD  traZODone (DESYREL) 50 MG tablet Take 1 tablet (50 mg total) by mouth at bedtime. 01/25/22  Yes Emokpae, Courage, MD  furosemide (LASIX) 20 MG tablet Take 3 tablets (60 mg total) by mouth 2 (two) times daily. Patient not taking: Reported on 04/06/2022 01/25/22 01/25/23  Roxan Hockey, MD  rifaximin (XIFAXAN) 550 MG TABS tablet Take 1 tablet (550 mg total) by mouth 2 (two) times daily. Patient not taking: Reported on 04/06/2022 01/25/22    Roxan Hockey, MD    Physical Exam: BP 130/61 (BP Location: Right Arm)   Pulse 83   Temp (!) 96.9 F (36.1 C)   Resp 18   Ht '6\' 2"'$  (1.88 m)   Wt (!) 177.4 kg   SpO2 95%   BMI 50.21 kg/m   General: 67 y.o. year-old male well developed well nourished in no acute distress.  Alert and oriented x3. HEENT: NCAT, EOMI Neck: Supple, trachea medial Cardiovascular: Regular rate and rhythm with no rubs or gallops.  No thyromegaly or JVD noted.  B/L +2 lower extremity edema. 2/4 pulses in all 4 extremities. Respiratory: Clear to auscultation with no wheezes or rales. Good inspiratory effort. Abdomen: Soft, nontender nondistended with normal bowel sounds x4 quadrants. Muskuloskeletal: No cyanosis or clubbing  noted bilaterally Neuro: CN II-XII intact, strength 5/5 x 4, sensation, reflexes intact Skin: No ulcerative lesions noted or rashes Psychiatry: Judgement and insight appear normal. Mood is  appropriate for condition and setting          Labs on Admission:  Basic Metabolic Panel: Recent Labs  Lab 04/06/22 1626  NA 135  K 3.9  CL 102  CO2 25  GLUCOSE 189*  BUN 15  CREATININE 1.19  CALCIUM 8.3*   Liver Function Tests: No results for input(s): "AST", "ALT", "ALKPHOS", "BILITOT", "PROT", "ALBUMIN" in the last 168 hours. No results for input(s): "LIPASE", "AMYLASE" in the last 168 hours. No results for input(s): "AMMONIA" in the last 168 hours. CBC: Recent Labs  Lab 04/06/22 1626  WBC 2.8*  HGB 10.9*  HCT 33.7*  MCV 90.8  PLT 65*   Cardiac Enzymes: No results for input(s): "CKTOTAL", "CKMB", "CKMBINDEX", "TROPONINI" in the last 168 hours.  BNP (last 3 results) Recent Labs    01/22/22 1755 02/27/22 1838 04/06/22 1626  BNP 58.0 34.0 37.0    ProBNP (last 3 results) No results for input(s): "PROBNP" in the last 8760 hours.  CBG: No results for input(s): "GLUCAP" in the last 168 hours.  Radiological Exams on Admission: DG Chest 2 View  Result Date:  04/06/2022 CLINICAL DATA:  Shortness of breath EXAM: CHEST - 2 VIEW COMPARISON:  02/27/2022 FINDINGS: Stable cardiomediastinal contours. Mildly increased bibasilar interstitial markings. No pleural effusion or pneumothorax. IMPRESSION: Mildly increased bibasilar interstitial markings, which [place] reflect atelectasis or developing infiltrate. Electronically Signed   By: Davina Poke D.O.   On: 04/06/2022 16:08    EKG: I independently viewed the EKG done and my findings are as followed: Normal sinus rhythm at a rate of 91 bpm  Assessment/Plan Present on Admission:  Liver cirrhosis secondary to NASH ([place])  Thrombocytopenia ([facility])  GERD (gastroesophageal reflux disease)  Essential hypertension  Depression  Principal Problem:   Liver cirrhosis secondary to NASH [facility] [place]) Active Problems:   Depression   Thrombocytopenia ([place])   Essential hypertension   GERD (gastroesophageal reflux disease)  NASH/cirrhosis Patient presents with bilateral leg swelling and distended abdomen He was treated with IV Lasix 40 mg x 1, we shall continue same at this time and add home Aldactone Continue rifaximin  Chronic leukopenia WBC 2.8, chest x-ray suspicious of early onset of infiltrate Procalcitonin will be checked prior to starting antibiotics  Chronic thrombocytopenia Platelets 65, continue to monitor platelet levels   GERD Continue Protonix   Acquired hypothyroidism Continue Synthroid   Essential hypertension Continue spironolactone, Lasix   Depression Continue Zoloft     DVT prophylaxis: SCDs   Code Status: Full code   Family Communication: None at bedside   Consults: None  Severity of Illness: The appropriate patient status for this patient is INPATIENT. Inpatient status is judged to be reasonable and necessary in order to provide the required intensity of service to ensure the patient's safety. The patient's presenting symptoms, physical exam findings, and initial radiographic and  laboratory data in the context of their chronic comorbidities is felt to place them at high risk for further clinical deterioration. Furthermore, it is not anticipated that the patient will be medically stable for discharge from the hospital within 2 midnights of admission.   * I certify that at the point of admission it is my clinical judgment that the patient will require inpatient hospital care spanning beyond 2 midnights from the point of admission due to high intensity of service, high risk for further deterioration and high frequency of surveillance required.*  Author: Bernadette Hoit, DO 04/07/2022 3:29 AM  For on call review [email or web address].

## 2022-04-07 NOTE — Hospital Course (Signed)
67 year old male with a history of diabetes mellitus type 2, hypertension, hypothyroidism, NASH liver cirrhosis presenting with 1 month history of lower extremity edema, shortness of breath and dyspnea on exertion that is worsened over the past 3 days.  Notably, the patient had a recent hospital admission from 02/27/2022 to 03/04/2022 secondary to decompensated liver cirrhosis with anasarca.  He was treated with IV furosemide.  He was discharged home with furosemide 60 mg twice daily and spironolactone 100 mg daily. In addition, he had another admission from 01/22/2022 to 01/25/2022 secondary to decompensated liver cirrhosis with anasarca.  At that time, the patient was discharged home with Lasix 60 mg twice daily and spironolactone 50 mg daily. He endorses compliance with his medications.  He denies any fevers, chills, chest pain, nausea, vomiting or diarrhea or abdominal pain. In the ED, the patient was afebrile and hemodynamically stable with oxygen saturation 95-97% on room air.  WBC 2.8, hemoglobin 10.2, platelets 61,000.  Sodium 138, potassium 3.4, serum creatinine 1.09.  AST 49, ALT 21, alk phosphatase 96, total bilirubin 3.2.  PCT was 0.26.  Chest x-ray showed increased bibasilar interstitial markings.  The patient was started on IV furosemide.

## 2022-04-07 NOTE — Progress Notes (Signed)
PROGRESS NOTE  Daniel Crawford MRN:[id number] DOB: 1956/02/17 DOA: 04/06/2022 PCP: Gwenlyn Saran [place]  Brief History:  67 year old male with a history of diabetes mellitus type 2, hypertension, hypothyroidism, NASH liver cirrhosis presenting with 1 month history of lower extremity edema, shortness of breath and dyspnea on exertion that is worsened over the past 3 days.  Notably, the patient had a recent hospital admission from 02/27/2022 to 03/04/2022 secondary to decompensated liver cirrhosis with anasarca.  He was treated with IV furosemide.  He was discharged home with furosemide 60 mg twice daily and spironolactone 100 mg daily. In addition, he had another admission from 01/22/2022 to 01/25/2022 secondary to decompensated liver cirrhosis with anasarca.  At that time, the patient was discharged home with Lasix 60 mg twice daily and spironolactone 50 mg daily. He endorses compliance with his medications.  He denies any fevers, chills, chest pain, nausea, vomiting or diarrhea or abdominal pain. In the ED, the patient was afebrile and hemodynamically stable with oxygen saturation 95-97% on room air.  WBC 2.8, hemoglobin 10.2, platelets 61,000.  Sodium 138, potassium 3.4, serum creatinine 1.09.  AST 49, ALT 21, alk phosphatase 96, total bilirubin 3.2.  PCT was 0.26.  Chest x-ray showed increased bibasilar interstitial markings.  The patient was started on IV furosemide.   Assessment/Plan: Decompensated liver cirrhosis with anasarca -Continue IV furosemide -many inconsistencies regarding how he takes furosemide at home -suspect a degree of dietary indiscretion contributing to his frequent decompensations -Low-sodium diet -Daily weights -Patient is not felt to be a TIPS or transplant candidate secondary to comorbidities -continue rifaximin -previously evaluated by [facility] for liver transplant, though not a candidate due to his BMI, multi-morbidities.     Hypokalemia -replete -check mag  Pancytopenia -due to liver cirrhosis -monitor for signs of bleeding  Diabetes mellitus type 2 -not on any agents at home -check A1C  Essential hypertension Holding coreg temporarily to allow BP margin for diuresis  Hypothyroidism Continue Synthroid  Depression/anxiety Continue Zoloft/hydroxyzine  Pancytopenia Chronic secondary to liver cirrhosis       Family Communication:  no Family at bedside  Consultants:  none  Code Status:  FULL  DVT Prophylaxis:  SCDs   Procedures: As Listed in Progress Note Above  Antibiotics: None     Subjective: Patient denies fevers, chills, headache, chest pain, dyspnea, nausea, vomiting, diarrhea, abdominal pain, dysuria, hematuria, hematochezia, and melena.   Objective: Vitals:   04/06/22 1930 04/06/22 2031 04/07/22 0012 04/07/22 0312  BP: 131/73 137/67 139/64 130/61  Pulse: 87 84 81 83  Resp: (!) '22 20 20 18  '$ Temp:  (!) 97.1 F (36.2 C) (!) 97 F (36.1 C) (!) 96.9 F (36.1 C)  TempSrc:      SpO2: 94% 98% 97% 95%  Weight:  (!) 177.4 kg    Height:        Intake/Output Summary (Last 24 hours) at 04/07/2022 1333 Last data filed at 04/07/2022 1300 Gross per 24 hour  Intake --  Output 700 ml  Net -700 ml   Weight change:  Exam:  General:  Pt is alert, follows commands appropriately, not in acute distress HEENT: No icterus, No thrush, No neck mass, /AT Cardiovascular: RRR, S1/S2, no rubs, no gallops Respiratory: bibasilar crackles. No wheeze Abdomen: Soft/+BS, non tender, non distended, no guarding Extremities: 2 + LE edema, No lymphangitis, No petechiae, No rashes, no synovitis   Data Reviewed: I have personally reviewed following labs and  imaging studies Basic Metabolic Panel: Recent Labs  Lab 04/06/22 1626 04/07/22 0403  NA 135 138  K 3.9 3.4*  CL 102 104  CO2 25 26  GLUCOSE 189* 112*  BUN 15 14  CREATININE 1.19 1.09  CALCIUM 8.3* 8.2*  MG  --  2.1  PHOS   --  3.3   Liver Function Tests: Recent Labs  Lab 04/07/22 0403  AST 49*  ALT 21  ALKPHOS 96  BILITOT 3.2*  PROT 6.1*  ALBUMIN 3.0*   No results for input(s): "LIPASE", "AMYLASE" in the last 168 hours. No results for input(s): "AMMONIA" in the last 168 hours. Coagulation Profile: No results for input(s): "INR", "PROTIME" in the last 168 hours. CBC: Recent Labs  Lab 04/06/22 1626 04/07/22 0403  WBC 2.8* 2.8*  HGB 10.9* 10.2*  HCT 33.7* 31.2*  MCV 90.8 89.4  PLT 65* 61*   Cardiac Enzymes: No results for input(s): "CKTOTAL", "CKMB", "CKMBINDEX", "TROPONINI" in the last 168 hours. BNP: Invalid input(s): "POCBNP" CBG: No results for input(s): "GLUCAP" in the last 168 hours. HbA1C: No results for input(s): "HGBA1C" in the last 72 hours. Urine analysis:    Component Value Date/Time   COLORURINE STRAW (A) 02/28/2022 0840   APPEARANCEUR CLEAR 02/28/2022 0840   LABSPEC 1.005 02/28/2022 0840   PHURINE 6.0 02/28/2022 0840   GLUCOSEU NEGATIVE 02/28/2022 0840   HGBUR SMALL (A) 02/28/2022 0840   BILIRUBINUR NEGATIVE 02/28/2022 0840   KETONESUR NEGATIVE 02/28/2022 0840   PROTEINUR NEGATIVE 02/28/2022 0840   NITRITE NEGATIVE 02/28/2022 0840   LEUKOCYTESUR NEGATIVE 02/28/2022 0840   Sepsis Labs: '@LABRCNTIP'$ (procalcitonin:4,lacticidven:4) )No results found for this or any previous visit (from the past 240 hour(s)).   Scheduled Meds:  levothyroxine  175 mcg Oral Q0600   pantoprazole  40 mg Oral Daily   rifaximin  550 mg Oral BID   sertraline  50 mg Oral QHS   spironolactone  100 mg Oral Daily   Continuous Infusions:  Procedures/Studies: DG Chest 2 View  Result Date: 04/06/2022 CLINICAL DATA:  Shortness of breath EXAM: CHEST - 2 VIEW COMPARISON:  02/27/2022 FINDINGS: Stable cardiomediastinal contours. Mildly increased bibasilar interstitial markings. No pleural effusion or pneumothorax. IMPRESSION: Mildly increased bibasilar interstitial markings, which [place] reflect  atelectasis or developing infiltrate. Electronically Signed   By: Davina Poke D.O.   On: 04/06/2022 16:08   US ABDOMEN LIMITED RUQ (LIVER/GB)  Result Date: 03/22/2022 CLINICAL DATA:  Cirrhosis.  Prior cholecystectomy. EXAM: ULTRASOUND ABDOMEN LIMITED RIGHT UPPER QUADRANT COMPARISON:  Ultrasound abdomen January 24, 2012; CT abdomen pelvis September 20, 2021 FINDINGS: Gallbladder: Surgically absent Common bile duct: Diameter: 3.3 mm Liver: Coarsened echogenicity and nodular contour. No focal lesion. Portal vein is patent on color Doppler imaging with normal direction of blood flow towards the liver. Other: None. IMPRESSION: Morphologic changes to the liver suggestive of cirrhosis. No focal lesion. Electronically Signed   By: Lovey Newcomer M.D.   On: 03/22/2022 08:49    Orson Eva, DO  [facility]  If 7PM-7AM, please contact night-coverage [email or web address] Password [email or web address] 04/07/2022, 1:33 PM   LOS: 1 day

## 2022-04-08 ENCOUNTER — Encounter (HOSPITAL_COMMUNITY): Payer: Self-pay | Admitting: Family Medicine

## 2022-04-08 DIAGNOSIS — R601 Generalized edema: Secondary | ICD-10-CM

## 2022-04-08 DIAGNOSIS — D696 Thrombocytopenia, unspecified: Secondary | ICD-10-CM | POA: Diagnosis not present

## 2022-04-08 DIAGNOSIS — D709 Neutropenia, unspecified: Secondary | ICD-10-CM | POA: Diagnosis not present

## 2022-04-08 DIAGNOSIS — K7581 Nonalcoholic steatohepatitis (NASH): Secondary | ICD-10-CM | POA: Diagnosis not present

## 2022-04-08 LAB — BASIC METABOLIC PANEL
Anion gap: 7 (ref 5–15)
BUN: 14 mg/dL (ref 8–23)
CO2: 27 mmol/L (ref 22–32)
Calcium: 8.2 mg/dL — ABNORMAL LOW (ref 8.9–10.3)
Chloride: 102 mmol/L (ref 98–111)
Creatinine, Ser: 1.13 mg/dL (ref 0.61–1.24)
GFR, Estimated: >60 mL/min (ref >60)
Glucose, Bld: 110 mg/dL — ABNORMAL HIGH (ref 70–99)
Potassium: 3.3 mmol/L — ABNORMAL LOW (ref 3.5–5.1)
Sodium: 136 mmol/L (ref 135–145)

## 2022-04-08 LAB — CBC
HCT: 30.5 % — ABNORMAL LOW (ref 39.0–52.0)
Hemoglobin: 10.1 g/dL — ABNORMAL LOW (ref 13.0–17.0)
MCH: 29.8 pg (ref 26.0–34.0)
MCHC: 33.1 g/dL (ref 30.0–36.0)
MCV: 90 fL (ref 80.0–100.0)
Platelets: 57 10*3/uL — ABNORMAL LOW (ref 150–400)
RBC: 3.39 MIL/uL — ABNORMAL LOW (ref 4.22–5.81)
RDW: 17.2 % — ABNORMAL HIGH (ref 11.5–15.5)
WBC: 2.6 10*3/uL — ABNORMAL LOW (ref 4.0–10.5)
nRBC: 0 % (ref 0.0–0.2)

## 2022-04-08 LAB — HEMOGLOBIN A1C
Hgb A1c MFr Bld: 5.1 % (ref 4.8–5.6)
Mean Plasma Glucose: 99.67 mg/dL

## 2022-04-08 LAB — MAGNESIUM: Magnesium: 2.1 mg/dL (ref 1.7–2.4)

## 2022-04-08 MED ORDER — FUROSEMIDE 10 MG/ML IJ SOLN
40.0000 mg | Freq: Once | INTRAMUSCULAR | Status: AC
  Administered 2022-04-08: 40 mg via INTRAVENOUS
  Filled 2022-04-08: qty 4

## 2022-04-08 MED ORDER — POTASSIUM CHLORIDE CRYS ER 20 MEQ PO TBCR
40.0000 meq | EXTENDED_RELEASE_TABLET | Freq: Once | ORAL | Status: AC
  Administered 2022-04-08: 40 meq via ORAL
  Filled 2022-04-08: qty 2

## 2022-04-08 MED ORDER — POTASSIUM CHLORIDE CRYS ER 20 MEQ PO TBCR
40.0000 meq | EXTENDED_RELEASE_TABLET | Freq: Every day | ORAL | Status: DC
  Administered 2022-04-09 – 2022-04-10 (×2): 40 meq via ORAL
  Filled 2022-04-08 (×2): qty 2

## 2022-04-08 MED ORDER — OXYCODONE HCL 5 MG PO TABS
5.0000 mg | ORAL_TABLET | Freq: Once | ORAL | Status: AC | PRN
  Administered 2022-04-08: 5 mg via ORAL
  Filled 2022-04-08: qty 1

## 2022-04-08 MED ORDER — FUROSEMIDE 10 MG/ML IJ SOLN
80.0000 mg | Freq: Every day | INTRAMUSCULAR | Status: DC
  Administered 2022-04-09: 80 mg via INTRAVENOUS
  Filled 2022-04-08: qty 8

## 2022-04-08 NOTE — Progress Notes (Signed)
PROGRESS NOTE  Daniel Crawford MRN:[id number] DOB: [birth year] DOA: 04/06/2022 PCP: Gwenlyn Saran [place]  Brief History:  67 year old male with a history of diabetes mellitus type 2, hypertension, hypothyroidism, NASH liver cirrhosis presenting with 1 month history of lower extremity edema, shortness of breath and dyspnea on exertion that is worsened over the past 3 days.  Notably, the patient had a recent hospital admission from 02/27/2022 to 03/04/2022 secondary to decompensated liver cirrhosis with anasarca.  He was treated with IV furosemide.  He was discharged home with furosemide 60 mg twice daily and spironolactone 100 mg daily. In addition, he had another admission from 01/22/2022 to 01/25/2022 secondary to decompensated liver cirrhosis with anasarca.  At that time, the patient was discharged home with Lasix 60 mg twice daily and spironolactone 50 mg daily. He endorses compliance with his medications.  He denies any fevers, chills, chest pain, nausea, vomiting or diarrhea or abdominal pain. In the ED, the patient was afebrile and hemodynamically stable with oxygen saturation 95-97% on room air.  WBC 2.8, hemoglobin 10.2, platelets 61,000.  Sodium 138, potassium 3.4, serum creatinine 1.09.  AST 49, ALT 21, alk phosphatase 96, total bilirubin 3.2.  PCT was 0.26.  Chest x-ray showed increased bibasilar interstitial markings.  The patient was started on IV furosemide.   Assessment/Plan: Decompensated liver cirrhosis with anasarca -Increase IV furosemide to 80 IV daily -many inconsistencies regarding how he takes furosemide at home -suspect a degree of dietary/fluid indiscretion contributing to his frequent decompensations -Low-sodium diet -Daily weights -Patient is not felt to be a TIPS or transplant candidate secondary to comorbidities -continue rifaximin -previously evaluated by [facility] for liver transplant, though not a candidate due to his BMI, multi-morbidities.      Hypokalemia -replete -check mag--2.1   Pancytopenia -due to liver cirrhosis -monitor for signs of bleeding   Diabetes mellitus type 2 -not on any agents at home -check A1C--5.1   Essential hypertension Holding coreg temporarily to allow BP margin for diuresis   Hypothyroidism Continue Synthroid   Depression/anxiety Continue Zoloft/hydroxyzine   Pancytopenia Chronic secondary to liver cirrhosis             Family Communication:  no Family at bedside   Consultants:  none   Code Status:  FULL   DVT Prophylaxis:  SCDs     Procedures: As Listed in Progress Note Above   Antibiotics: None        Subjective: Patient denies fevers, chills, headache, chest pain, dyspnea, nausea, vomiting, diarrhea, abdominal pain, dysuria, hematuria, hematochezia, and melena.   Objective: Vitals:   04/07/22 1410 04/07/22 2015 04/08/22 0407 04/08/22 1226  BP: 122/68 134/68 (!) 118/59 112/64  Pulse: 72 78 81 79  Resp: '18 18 18 19  '$ Temp: 98.9 F (37.2 C) (!) 97.1 F (36.2 C) 98 F (36.7 C) 98 F (36.7 C)  TempSrc: Oral     SpO2: 96% 97% 100% 98%  Weight:      Height:        Intake/Output Summary (Last 24 hours) at 04/08/2022 1648 Last data filed at 04/08/2022 1456 Gross per 24 hour  Intake 960 ml  Output 4700 ml  Net -3740 ml   Weight change:  Exam:  General:  Pt is alert, follows commands appropriately, not in acute distress HEENT: No icterus, No thrush, No neck mass, [place]/AT Cardiovascular: RRR, S1/S2, no rubs, no gallops Respiratory: CTA bilaterally, no wheezing, no crackles, no rhonchi Abdomen: Soft/+BS,  non tender, non distended, no guarding Extremities: 2+ LE edema, No lymphangitis, No petechiae, No rashes, no synovitis   Data Reviewed: I have personally reviewed following labs and imaging studies Basic Metabolic Panel: Recent Labs  Lab 04/06/22 1626 04/07/22 0403 04/08/22 0458  NA 135 138 136  K 3.9 3.4* 3.3*  CL 102 104 102  CO2 '25 26 27   '$ GLUCOSE 189* 112* 110*  BUN '15 14 14  '$ CREATININE 1.19 1.09 1.13  CALCIUM 8.3* 8.2* 8.2*  MG  --  2.1 2.1  PHOS  --  3.3  --    Liver Function Tests: Recent Labs  Lab 04/07/22 0403  AST 49*  ALT 21  ALKPHOS 96  BILITOT 3.2*  PROT 6.1*  ALBUMIN 3.0*   No results for input(s): "LIPASE", "AMYLASE" in the last 168 hours. No results for input(s): "AMMONIA" in the last 168 hours. Coagulation Profile: No results for input(s): "INR", "PROTIME" in the last 168 hours. CBC: Recent Labs  Lab 04/06/22 1626 04/07/22 0403 04/08/22 0458  WBC 2.8* 2.8* 2.6*  HGB 10.9* 10.2* 10.1*  HCT 33.7* 31.2* 30.5*  MCV 90.8 89.4 90.0  PLT 65* 61* 57*   Cardiac Enzymes: No results for input(s): "CKTOTAL", "CKMB", "CKMBINDEX", "TROPONINI" in the last 168 hours. BNP: Invalid input(s): "POCBNP" CBG: No results for input(s): "GLUCAP" in the last 168 hours. HbA1C: Recent Labs    04/08/22 0458  HGBA1C 5.1   Urine analysis:    Component Value Date/Time   COLORURINE STRAW (A) 02/28/2022 0840   APPEARANCEUR CLEAR 02/28/2022 0840   LABSPEC 1.005 02/28/2022 0840   PHURINE 6.0 02/28/2022 0840   GLUCOSEU NEGATIVE 02/28/2022 0840   HGBUR SMALL (A) 02/28/2022 0840   BILIRUBINUR NEGATIVE 02/28/2022 0840   KETONESUR NEGATIVE 02/28/2022 0840   PROTEINUR NEGATIVE 02/28/2022 0840   NITRITE NEGATIVE 02/28/2022 0840   LEUKOCYTESUR NEGATIVE 02/28/2022 0840   Sepsis Labs: '@LABRCNTIP'$ (procalcitonin:4,lacticidven:4) )No results found for this or any previous visit (from the past 240 hour(s)).   Scheduled Meds:  furosemide  40 mg Intravenous Once   [START ON 04/09/2022] furosemide  80 mg Intravenous Daily   levothyroxine  175 mcg Oral Q0600   pantoprazole  40 mg Oral Daily   [START ON 04/09/2022] potassium chloride  40 mEq Oral Daily   rifaximin  550 mg Oral BID   sertraline  50 mg Oral QHS   spironolactone  100 mg Oral Daily   Continuous Infusions:  Procedures/Studies: DG Chest 2 View  Result  Date: 04/06/2022 CLINICAL DATA:  Shortness of breath EXAM: CHEST - 2 VIEW COMPARISON:  02/27/2022 FINDINGS: Stable cardiomediastinal contours. Mildly increased bibasilar interstitial markings. No pleural effusion or pneumothorax. IMPRESSION: Mildly increased bibasilar interstitial markings, which [place] reflect atelectasis or developing infiltrate. Electronically Signed   By: Davina Poke D.O.   On: 04/06/2022 16:08   US ABDOMEN LIMITED RUQ (LIVER/GB)  Result Date: 03/22/2022 CLINICAL DATA:  Cirrhosis.  Prior cholecystectomy. EXAM: ULTRASOUND ABDOMEN LIMITED RIGHT UPPER QUADRANT COMPARISON:  Ultrasound abdomen January 24, 2012; CT abdomen pelvis September 20, 2021 FINDINGS: Gallbladder: Surgically absent Common bile duct: Diameter: 3.3 mm Liver: Coarsened echogenicity and nodular contour. No focal lesion. Portal vein is patent on color Doppler imaging with normal direction of blood flow towards the liver. Other: None. IMPRESSION: Morphologic changes to the liver suggestive of cirrhosis. No focal lesion. Electronically Signed   By: Lovey Newcomer M.D.   On: 03/22/2022 08:49    Orson Eva, DO  [facility]  If 7PM-7AM,  please contact night-coverage [email or web address] Password [facility] 04/08/2022, 4:48 PM   LOS: 2 days

## 2022-04-08 NOTE — Progress Notes (Signed)
Pt requesting palliative consult to assist with management of chronic disease processes outside of hospital in efforts to avoid frequent hospitalizations.

## 2022-04-09 ENCOUNTER — Encounter (HOSPITAL_COMMUNITY): Payer: Self-pay | Admitting: Family Medicine

## 2022-04-09 DIAGNOSIS — I10 Essential (primary) hypertension: Secondary | ICD-10-CM | POA: Diagnosis not present

## 2022-04-09 DIAGNOSIS — Z7189 Other specified counseling: Secondary | ICD-10-CM

## 2022-04-09 DIAGNOSIS — D696 Thrombocytopenia, unspecified: Secondary | ICD-10-CM | POA: Diagnosis not present

## 2022-04-09 DIAGNOSIS — R601 Generalized edema: Secondary | ICD-10-CM | POA: Diagnosis not present

## 2022-04-09 DIAGNOSIS — K7460 Unspecified cirrhosis of liver: Secondary | ICD-10-CM | POA: Diagnosis not present

## 2022-04-09 DIAGNOSIS — Z515 Encounter for palliative care: Secondary | ICD-10-CM | POA: Diagnosis not present

## 2022-04-09 DIAGNOSIS — K7581 Nonalcoholic steatohepatitis (NASH): Secondary | ICD-10-CM | POA: Diagnosis not present

## 2022-04-09 LAB — BASIC METABOLIC PANEL
Anion gap: 10 (ref 5–15)
BUN: 12 mg/dL (ref 8–23)
CO2: 27 mmol/L (ref 22–32)
Calcium: 8.3 mg/dL — ABNORMAL LOW (ref 8.9–10.3)
Chloride: 99 mmol/L (ref 98–111)
Creatinine, Ser: 1.15 mg/dL (ref 0.61–1.24)
GFR, Estimated: >60 mL/min (ref >60)
Glucose, Bld: 112 mg/dL — ABNORMAL HIGH (ref 70–99)
Potassium: 3.3 mmol/L — ABNORMAL LOW (ref 3.5–5.1)
Sodium: 136 mmol/L (ref 135–145)

## 2022-04-09 LAB — MAGNESIUM: Magnesium: 2 mg/dL (ref 1.7–2.4)

## 2022-04-09 MED ORDER — TRAMADOL HCL 50 MG PO TABS
50.0000 mg | ORAL_TABLET | Freq: Four times a day (QID) | ORAL | Status: DC | PRN
  Administered 2022-04-09: 50 mg via ORAL
  Filled 2022-04-09: qty 1

## 2022-04-09 MED ORDER — FUROSEMIDE 10 MG/ML IJ SOLN
80.0000 mg | Freq: Two times a day (BID) | INTRAMUSCULAR | Status: DC
  Administered 2022-04-09 – 2022-04-10 (×2): 80 mg via INTRAVENOUS
  Filled 2022-04-09 (×2): qty 8

## 2022-04-09 NOTE — TOC Initial Note (Signed)
Transition of Care [facility]) - Initial/Assessment Note    Patient Details  Name: Daniel Crawford MRN: [id number] Date of Birth: May 18, 1955  Transition of [facility]) CM/SW Contact:    Ihor Gully, LCSW Phone Number: 04/09/2022, 2:21 PM  Clinical Narrative:                 Patient from home alone. Has dog. Admitted for NASH. Independent with self care, drives. No DME in the home. Review of chart shows that patient was referred to [facility] last month. Patient says he is not receiving any services. Follow up message left for Pia Mau with [facility] to identify status of previously made referral.   Expected Discharge Plan: Home/Self Care Barriers to Discharge: Continued Medical Work up   Patient Goals and CMS Choice            Expected Discharge Plan and Services                                              Prior Living Arrangements/Services                       Activities of Daily Living Home Assistive Devices/Equipment: None ADL Screening (condition at time of admission) Patient's cognitive ability adequate to safely complete daily activities?: Yes Is the patient deaf or have difficulty hearing?: No Does the patient have difficulty seeing, even when wearing glasses/contacts?: No Does the patient have difficulty concentrating, remembering, or making decisions?: No Patient able to express need for assistance with ADLs?: Yes Does the patient have difficulty dressing or bathing?: Yes Independently performs ADLs?: Yes (appropriate for developmental age) Does the patient have difficulty walking or climbing stairs?: No Weakness of Legs: Both Weakness of Arms/Hands: None  Permission Sought/Granted                  Emotional Assessment     Affect (typically observed): Appropriate Orientation: : Oriented to Self, Oriented to Place, Oriented to Situation, Oriented to  Time Alcohol / Substance Use: Not Applicable Psych  Involvement: No (comment)  Admission diagnosis:  Acute exacerbation of CHF (congestive heart failure) ([place]) [I50.9] Acute on chronic congestive heart failure, unspecified heart failure type ([place]) [I50.9] Patient Active Problem List   Diagnosis Date Noted   Acute exacerbation of CHF (congestive heart failure) ([place]) 04/06/2022   Liver cirrhosis secondary to NASH ([place]) 03/13/2022   Iron deficiency anemia 02/28/2022   Hypoalbuminemia due to protein-calorie malnutrition ([place]) 02/28/2022   GERD (gastroesophageal reflux disease) 02/28/2022   Abdominal pain 02/28/2022   Anasarca 01/23/2022   Generalized weakness 01/23/2022   Essential hypertension 01/23/2022   Volume overload 01/23/2022   Hypervolemia    Elevated TSH 01/22/2022   D-dimer, elevated 01/22/2022   Protein calorie malnutrition ([place]) 01/22/2022   Chronic diastolic heart failure ([place])    Hypogonadism, male 12/26/2021   History of colonic polyps 07/27/2021   Visual hallucination 07/27/2021   Auditory hallucinations 07/27/2021   Leukopenia 04/19/2021   Hypokalemia 04/18/2021   Dyspnea on exertion 01/20/2020   OSA (obstructive sleep apnea) 01/20/2020   History of ascites 10/22/2019   Acquired hypothyroidism 10/16/2016   Cirrhosis of liver with ascites ([place]) 10/16/2016   Depression 10/16/2016   Thrombocytopenia ([place]) 10/16/2016   Spleen enlarged 10/16/2016   PCP:  [facility], [place]:   [facility]  [place], [place] [address] Phone: [phone] Fax: [phone]     Social Determinants of Health (SDOH) Social History: SDOH Screenings   Food Insecurity: No Food Insecurity (04/06/2022)  Housing: Low Risk  (04/06/2022)  Transportation Needs: No Transportation Needs (04/06/2022)  Utilities: Not At Risk (04/06/2022)  Depression (PHQ2-9): Medium Risk (10/27/2019)  Tobacco Use: Medium Risk (04/08/2022)   SDOH  Interventions:     Readmission Risk Interventions     No data to display

## 2022-04-09 NOTE — Consult Note (Signed)
Consultation Note Date: 04/09/2022   Patient Name: Daniel Crawford  DOB: 11/14/1955  MRN: [id number]  Age / Sex: 67 y.o., male  PCP: [facility], [place] Referring Physician: Orson Eva, MD  Reason for Consultation: Establishing goals of care  HPI/Patient Profile: 67 y.o. male  with past medical history of heart failure, cirrhosis due to NASH, morbid obesity, DM 2, HTN, hypothyroid, thrombocytopenia, 3 hospital stays in the last 6 months admitted on 04/06/2022 with decompensated liver cirrhosis with anasarca.   Clinical Assessment and Goals of Care: I have reviewed medical records including EPIC notes, labs and imaging, received report from RN, assessed the patient.  Daniel Crawford is sitting up in bed.  He greets me, making and mostly keeping eye contact.  He appears chronically ill and somewhat frail.  He is alert and oriented x 3, able to make his needs known.  There is no family at bedside at this time.  We meet at the bedside to discuss diagnosis prognosis, GOC, EOL wishes, disposition and options.  I introduced Palliative Medicine as specialized medical care for people living with serious illness. It focuses on providing relief from the symptoms and stress of a serious illness. The goal is to improve quality of life for both the patient and the family.  We discussed a brief life review of the patient.  Daniel Crawford tells me that he is experienced multiple hospitalizations for fluid overload in the last few months.  He shares his concerne about this.  He shares that he is living independently in his own home.  He has a 64 y/o dog that he cares for.  He was a former Theatre manager.   We then focused on their current illness.  Overall, Daniel Crawford seems knowledgeable about his chronic illness burden and the treatment plan.  He states his goal is to return to his home.  He shares that he understands that he will continue  to have hospital visits.  He tells me that he is able to have a few good weeks, but eventually the fluid starts to build up again.  I shared that this is a natural part of his chronic illness burden.  The natural disease trajectory and expectations at EOL were discussed.  Advanced directives, concepts specific to code status, artifical feeding and hydration, and rehospitalization were considered and discussed.  Daniel Crawford continues full scope/full code.  Hospice and Palliative Care services outpatient were explained and offered.  We talk about the benefits of outpatient palliative services, chronic illness management.  Daniel Crawford is agreeable for outpatient palliative services in his home.  Provider choice offered.  [place]  Discussed the importance of continued conversation with family and the medical providers regarding overall plan of care and treatment options, ensuring decisions are within the context of the patient's values and GOCs.  Questions and concerns were addressed.  The patient was encouraged to call with questions or concerns.  PMT will continue to support holistically.  Conference with attending, bedside nursing staff, transition of care  team related to patient condition, needs, goals of care, disposition.   HCPOA   NEXT OF KIN -I again today encourage Daniel Crawford to consider he would make healthcare choices if he were unable.  Initially, when asked, he mentions that his brother is in charge of his final affairs.  I encouraged him to consider who would make choices for his health before he is deceased if he were unable    SUMMARY OF RECOMMENDATIONS   At this point continue full scope/full code.   Time for outcomes.   Home with home health if needed. Outpatient palliative services with hospice of [facility].   Code Status/Advance Care Planning: Full code  Symptom Management:  Per hospitalist, no additional needs at this time.  Palliative Prophylaxis:   Frequent Pain Assessment and Oral Care  Additional Recommendations (Limitations, Scope, Preferences): Full Scope Treatment  Psycho-social/Spiritual:  Desire for further Chaplaincy support:no Additional Recommendations: Caregiving  Support/Resources and Education on Hospice  Prognosis:  Unable to determine, based on outcomes.  Guarded.  12 months or less at this point would not be surprising based on chronic illness burden, 3 hospital visits in the last 6 months.  Discharge Planning: Home with home health, outpatient palliative services with [facility]      Primary Diagnoses: Present on Admission:  Liver cirrhosis secondary to NASH ([place])  Thrombocytopenia ([place])  GERD (gastroesophageal reflux disease)  Essential hypertension  Depression  Leukopenia   I have reviewed the medical record, interviewed the patient and family, and examined the patient. The following aspects are pertinent.  Past Medical History:  Diagnosis Date   Anemia    Anxiety    Asthma    CHF (congestive heart failure) (HCC)    CKD (chronic kidney disease)    Depression    Diabetes mellitus without complication (HCC)    Dyspnea    Hypertension    Hypothyroidism    Liver cirrhosis secondary to NASH (HCC)    NASH (nonalcoholic steatohepatitis)    Pre-diabetes    Spleen enlarged    Thrombocytopenia (HCC)    Social History   Socioeconomic History   Marital status: Single    Spouse name: Not on file   Number of children: Not on file   Years of education: Not on file   Highest education level: Not on file  Occupational History   Not on file  Tobacco Use   Smoking status: Former    Packs/day: 1.00    Years: 20.00    Total pack years: 20.00    Types: Cigarettes    Quit date: 2017    Years since quitting: 7.0   Smokeless tobacco: Never  Vaping Use   Vaping Use: Never used  Substance and Sexual Activity   Alcohol use: No   Drug use: No   Sexual activity: Not on file  Other Topics Concern    Not on file  Social History Narrative   Not on file   Social Determinants of Health   Financial Resource Strain: Not on file  Food Insecurity: No Food Insecurity (04/06/2022)   Hunger Vital Sign    Worried About Running Out of Food in the Last Year: Never true    Ran Out of Food in the Last Year: Never true  Transportation Needs: No Transportation Needs (04/06/2022)   PRAPARE - Hydrologist (Medical): No    Lack of Transportation (Non-Medical): No  Physical Activity: Not on file  Stress: Not on file  Social  Connections: Not on file   Family History  Problem Relation Age of Onset   Liver cancer Mother    Heart disease Mother    Aneurysm Sister    Scheduled Meds:  furosemide  80 mg Intravenous Daily   levothyroxine  175 mcg Oral Q0600   pantoprazole  40 mg Oral Daily   potassium chloride  40 mEq Oral Daily   rifaximin  550 mg Oral BID   sertraline  50 mg Oral QHS   spironolactone  100 mg Oral Daily   Continuous Infusions: PRN Meds:.gabapentin, ondansetron **OR** ondansetron (ZOFRAN) IV, prochlorperazine, traMADol Medications Prior to Admission:  Prior to Admission medications   Medication Sig Start Date End Date Taking? Authorizing Provider  acetaminophen (TYLENOL) 500 MG tablet Take 1,000 mg by mouth every 6 (six) hours as needed for moderate pain.   Yes [provider]  albuterol (VENTOLIN HFA) 108 (90 Base) MCG/ACT inhaler Inhale 2 puffs into the lungs every 6 (six) hours as needed for wheezing or shortness of breath. 03/04/22  Yes Johnson, Clanford L, MD  carvedilol (COREG) 6.25 MG tablet Take 1 tablet (6.25 mg total) by mouth 2 (two) times daily with a meal. 01/25/22  Yes Emokpae, Courage, MD  furosemide (LASIX) 40 MG tablet Take 40 mg by mouth daily.   Yes [provider]  gabapentin (NEURONTIN) 100 MG capsule Take 100 mg by mouth daily as needed (pain). 02/20/21  Yes [provider]  hydrOXYzine (VISTARIL) 50 MG  capsule Take 1 capsule (50 mg total) by mouth 3 (three) times daily as needed for anxiety or nausea. 01/25/22  Yes Emokpae, Courage, MD  levothyroxine (SYNTHROID) 175 MCG tablet Take 175 mcg by mouth daily before breakfast.   Yes [provider]  pantoprazole (PROTONIX) 40 MG tablet Take 1 tablet (40 mg total) by mouth daily. 01/25/22  Yes Emokpae, Courage, MD  potassium chloride SA (KLOR-CON M) 20 MEQ tablet Take 2 tablets (40 mEq total) by mouth daily. Patient taking differently: Take 40 mEq by mouth 2 (two) times daily. 03/05/22  Yes Johnson, Clanford L, MD  sertraline (ZOLOFT) 50 MG tablet Take 1 tablet (50 mg total) by mouth at bedtime. 01/25/22  Yes Emokpae, Courage, MD  spironolactone (ALDACTONE) 100 MG tablet Take 1 tablet (100 mg total) by mouth daily. 03/04/22  Yes Johnson, Clanford L, MD  traZODone (DESYREL) 50 MG tablet Take 1 tablet (50 mg total) by mouth at bedtime. 01/25/22  Yes Emokpae, Courage, MD  furosemide (LASIX) 20 MG tablet Take 3 tablets (60 mg total) by mouth 2 (two) times daily. Patient not taking: Reported on 04/06/2022 01/25/22 01/25/23  Roxan Hockey, MD  rifaximin (XIFAXAN) 550 MG TABS tablet Take 1 tablet (550 mg total) by mouth 2 (two) times daily. Patient not taking: Reported on 04/06/2022 01/25/22   Roxan Hockey, MD   No Known Allergies Review of Systems  Unable to perform ROS: Other    Physical Exam Vitals and nursing note reviewed.  Constitutional:      General: He is not in acute distress.    Appearance: He is obese. He is ill-appearing.  Skin:    General: Skin is warm and dry.  Neurological:     Mental Status: He is alert and oriented to person, place, and time.  Psychiatric:        Mood and Affect: Mood normal.        Behavior: Behavior normal.     Vital Signs: BP 122/60 (BP Location: Right Arm)  Pulse 93   Temp 98 F (36.7 C) (Oral)   Resp (!) 21   Ht '6\' 2"'$  (1.88 m)   Wt (!) 173.7 kg   SpO2 95%   BMI 49.17 kg/m  Pain Scale:  0-10   Pain Score: 9    SpO2: SpO2: 95 % O2 Device:SpO2: 95 % O2 Flow Rate: .   IO: Intake/output summary:  Intake/Output Summary (Last 24 hours) at 04/09/2022 1518 Last data filed at 04/09/2022 3546 Gross per 24 hour  Intake 720 ml  Output 2875 ml  Net -2155 ml    LBM: Last BM Date : 04/06/22 Baseline Weight: Weight: (!) 163.3 kg Most recent weight: Weight: (!) 173.7 kg     Palliative Assessment/Data:     Time In: 1520 Time Out: 1615 Time Total: 55 minutes Greater than 50%  of this time was spent counseling and coordinating care related to the above assessment and plan.  Signed by: Drue Novel, NP   Please contact Palliative Medicine Team phone at [phone] for questions and concerns.  For individual provider: See Shea Evans

## 2022-04-09 NOTE — Progress Notes (Signed)
PROGRESS NOTE  Daniel Crawford MRN:[id number] DOB: 23-Apr-1955 DOA: 04/06/2022 PCP: [facility]  Brief History:  67 year old male with a history of diabetes mellitus type 2, hypertension, hypothyroidism, NASH liver cirrhosis presenting with 1 month history of lower extremity edema, shortness of breath and dyspnea on exertion that is worsened over the past 3 days.  Notably, the patient had a recent hospital admission from 02/27/2022 to 03/04/2022 secondary to decompensated liver cirrhosis with anasarca.  He was treated with IV furosemide.  He was discharged home with furosemide 60 mg twice daily and spironolactone 100 mg daily. In addition, he had another admission from 01/22/2022 to 01/25/2022 secondary to decompensated liver cirrhosis with anasarca.  At that time, the patient was discharged home with Lasix 60 mg twice daily and spironolactone 50 mg daily. He endorses compliance with his medications.  He denies any fevers, chills, chest pain, nausea, vomiting or diarrhea or abdominal pain. In the ED, the patient was afebrile and hemodynamically stable with oxygen saturation 95-97% on room air.  WBC 2.8, hemoglobin 10.2, platelets 61,000.  Sodium 138, potassium 3.4, serum creatinine 1.09.  AST 49, ALT 21, alk phosphatase 96, total bilirubin 3.2.  PCT was 0.26.  Chest x-ray showed increased bibasilar interstitial markings.  The patient was started on IV furosemide.   Assessment/Plan: Decompensated liver cirrhosis with anasarca -Increase IV furosemide to 80 IV bid -many inconsistencies regarding how he takes furosemide at home -suspect a degree of dietary/fluid indiscretion contributing to his frequent decompensations -Low-sodium diet -Daily weights -Patient is not felt to be a TIPS or transplant candidate secondary to comorbidities -continue rifaximin -previously evaluated by [facility] for liver transplant, though not a candidate due to his BMI, multi-morbidities.      Hypokalemia -repleted -check mag--2.1   Pancytopenia -due to liver cirrhosis -monitor for signs of bleeding   Diabetes mellitus type 2 -not on any agents at home -check A1C--5.1   Essential hypertension Holding coreg temporarily to allow BP margin for diuresis   Hypothyroidism Continue Synthroid   Depression/anxiety Continue Zoloft/hydroxyzine   Pancytopenia Chronic secondary to liver cirrhosis             Family Communication:  no Family at bedside   Consultants:  none   Code Status:  FULL   DVT Prophylaxis:  SCDs     Procedures: As Listed in Progress Note Above   Antibiotics: None      Subjective: Pt is in better spirits today.  Feels less abd distension and leg edema.  Denies f/c, cp, n/v/d, abd pain  Objective: Vitals:   04/08/22 2029 04/09/22 0351 04/09/22 0411 04/09/22 1248  BP: 135/64  127/61 122/60  Pulse: 76  78 93  Resp: 18  18 (!) 21  Temp: 99.1 F (37.3 C)  97.6 F (36.4 C) 98 F (36.7 C)  TempSrc: Oral  Oral Oral  SpO2: 97%  96% 95%  Weight:  (!) 173.7 kg    Height:        Intake/Output Summary (Last 24 hours) at 04/09/2022 1759 Last data filed at 04/09/2022 1532 Gross per 24 hour  Intake 1200 ml  Output 4475 ml  Net -3275 ml   Weight change:  Exam:  General:  Pt is alert, follows commands appropriately, not in acute distress HEENT: No icterus, No thrush, No neck mass, [place]/AT Cardiovascular: RRR, S1/S2, no rubs, no gallops Respiratory: bibasilar crackles.  No wheeze Abdomen: Soft/+BS, non tender, non distended, no  guarding Extremities: 1 + LE edema, No lymphangitis, No petechiae, No rashes, no synovitis   Data Reviewed: I have personally reviewed following labs and imaging studies Basic Metabolic Panel: Recent Labs  Lab 04/06/22 1626 04/07/22 0403 04/08/22 0458 04/09/22 0519  NA 135 138 136 136  K 3.9 3.4* 3.3* 3.3*  CL 102 104 102 99  CO2 '25 26 27 27  '$ GLUCOSE 189* 112* 110* 112*  BUN '15 14 14 12   '$ CREATININE 1.19 1.09 1.13 1.15  CALCIUM 8.3* 8.2* 8.2* 8.3*  MG  --  2.1 2.1 2.0  PHOS  --  3.3  --   --    Liver Function Tests: Recent Labs  Lab 04/07/22 0403  AST 49*  ALT 21  ALKPHOS 96  BILITOT 3.2*  PROT 6.1*  ALBUMIN 3.0*   No results for input(s): "LIPASE", "AMYLASE" in the last 168 hours. No results for input(s): "AMMONIA" in the last 168 hours. Coagulation Profile: No results for input(s): "INR", "PROTIME" in the last 168 hours. CBC: Recent Labs  Lab 04/06/22 1626 04/07/22 0403 04/08/22 0458  WBC 2.8* 2.8* 2.6*  HGB 10.9* 10.2* 10.1*  HCT 33.7* 31.2* 30.5*  MCV 90.8 89.4 90.0  PLT 65* 61* 57*   Cardiac Enzymes: No results for input(s): "CKTOTAL", "CKMB", "CKMBINDEX", "TROPONINI" in the last 168 hours. BNP: Invalid input(s): "POCBNP" CBG: No results for input(s): "GLUCAP" in the last 168 hours. HbA1C: Recent Labs    04/08/22 0458  HGBA1C 5.1   Urine analysis:    Component Value Date/Time   COLORURINE STRAW (A) 02/28/2022 0840   APPEARANCEUR CLEAR 02/28/2022 0840   LABSPEC 1.005 02/28/2022 0840   PHURINE 6.0 02/28/2022 0840   GLUCOSEU NEGATIVE 02/28/2022 0840   HGBUR SMALL (A) 02/28/2022 0840   BILIRUBINUR NEGATIVE 02/28/2022 0840   KETONESUR NEGATIVE 02/28/2022 0840   PROTEINUR NEGATIVE 02/28/2022 0840   NITRITE NEGATIVE 02/28/2022 0840   LEUKOCYTESUR NEGATIVE 02/28/2022 0840   Sepsis Labs: '@LABRCNTIP'$ (procalcitonin:4,lacticidven:4) )No results found for this or any previous visit (from the past 240 hour(s)).   Scheduled Meds:  furosemide  80 mg Intravenous BID   levothyroxine  175 mcg Oral Q0600   pantoprazole  40 mg Oral Daily   potassium chloride  40 mEq Oral Daily   rifaximin  550 mg Oral BID   sertraline  50 mg Oral QHS   spironolactone  100 mg Oral Daily   Continuous Infusions:  Procedures/Studies: DG Chest 2 View  Result Date: 04/06/2022 CLINICAL DATA:  Shortness of breath EXAM: CHEST - 2 VIEW COMPARISON:  02/27/2022  FINDINGS: Stable cardiomediastinal contours. Mildly increased bibasilar interstitial markings. No pleural effusion or pneumothorax. IMPRESSION: Mildly increased bibasilar interstitial markings, which [place] reflect atelectasis or developing infiltrate. Electronically Signed   By: Davina Poke D.O.   On: 04/06/2022 16:08   US ABDOMEN LIMITED RUQ (LIVER/GB)  Result Date: 03/22/2022 CLINICAL DATA:  Cirrhosis.  Prior cholecystectomy. EXAM: ULTRASOUND ABDOMEN LIMITED RIGHT UPPER QUADRANT COMPARISON:  Ultrasound abdomen January 24, 2012; CT abdomen pelvis September 20, 2021 FINDINGS: Gallbladder: Surgically absent Common bile duct: Diameter: 3.3 mm Liver: Coarsened echogenicity and nodular contour. No focal lesion. Portal vein is patent on color Doppler imaging with normal direction of blood flow towards the liver. Other: None. IMPRESSION: Morphologic changes to the liver suggestive of cirrhosis. No focal lesion. Electronically Signed   By: Lovey Newcomer M.D.   On: 03/22/2022 08:49    Orson Eva, DO  [facility]  If 7PM-7AM, please contact night-coverage [email or web address]  Password [email or web address] 04/09/2022, 5:59 PM   LOS: 3 days

## 2022-04-09 NOTE — Progress Notes (Signed)
Wt decrease 8 lb since admit, >5000 ml urine output in 24 hrs.  Pt reports breathing improved, LE edema improved.  No pain.  NSR on tele.

## 2022-04-10 DIAGNOSIS — K7581 Nonalcoholic steatohepatitis (NASH): Secondary | ICD-10-CM | POA: Diagnosis not present

## 2022-04-10 DIAGNOSIS — D709 Neutropenia, unspecified: Secondary | ICD-10-CM | POA: Diagnosis not present

## 2022-04-10 DIAGNOSIS — D696 Thrombocytopenia, unspecified: Secondary | ICD-10-CM | POA: Diagnosis not present

## 2022-04-10 DIAGNOSIS — K7460 Unspecified cirrhosis of liver: Secondary | ICD-10-CM | POA: Diagnosis not present

## 2022-04-10 LAB — BASIC METABOLIC PANEL
Anion gap: 10 (ref 5–15)
BUN: 14 mg/dL (ref 8–23)
CO2: 29 mmol/L (ref 22–32)
Calcium: 8.3 mg/dL — ABNORMAL LOW (ref 8.9–10.3)
Chloride: 97 mmol/L — ABNORMAL LOW (ref 98–111)
Creatinine, Ser: 1.26 mg/dL — ABNORMAL HIGH (ref 0.61–1.24)
GFR, Estimated: >60 mL/min (ref >60)
Glucose, Bld: 104 mg/dL — ABNORMAL HIGH (ref 70–99)
Potassium: 3.4 mmol/L — ABNORMAL LOW (ref 3.5–5.1)
Sodium: 136 mmol/L (ref 135–145)

## 2022-04-10 LAB — MAGNESIUM: Magnesium: 1.9 mg/dL (ref 1.7–2.4)

## 2022-04-10 MED ORDER — TORSEMIDE 20 MG PO TABS: 40.0000 mg | ORAL_TABLET | Freq: Two times a day (BID) | ORAL | Status: DC

## 2022-04-10 MED ORDER — TORSEMIDE 40 MG PO TABS: 40.0000 mg | ORAL_TABLET | Freq: Two times a day (BID) | ORAL | 1 refills | Status: DC

## 2022-04-10 NOTE — Discharge Summary (Signed)
Physician Discharge Summary   Patient: Daniel Crawford MRN: [id number] DOB: 1956/02/23  Admit date:     04/06/2022  Discharge date: 04/10/22  Discharge Physician: Shanon Brow Nickole Adamek   PCP: [facility]   Recommendations at discharge:   Please follow up with primary care provider within 1-2 weeks  Please repeat BMP and CBC in one week     Hospital Course: 67 year old male with a history of diabetes mellitus type 2, hypertension, hypothyroidism, NASH liver cirrhosis presenting with 1 month history of lower extremity edema, shortness of breath and dyspnea on exertion that is worsened over the past 3 days.  Notably, the patient had a recent hospital admission from 02/27/2022 to 03/04/2022 secondary to decompensated liver cirrhosis with anasarca.  He was treated with IV furosemide.  He was discharged home with furosemide 60 mg twice daily and spironolactone 100 mg daily. In addition, he had another admission from 01/22/2022 to 01/25/2022 secondary to decompensated liver cirrhosis with anasarca.  At that time, the patient was discharged home with Lasix 60 mg twice daily and spironolactone 50 mg daily. He endorses compliance with his medications.  He denies any fevers, chills, chest pain, nausea, vomiting or diarrhea or abdominal pain. In the ED, the patient was afebrile and hemodynamically stable with oxygen saturation 95-97% on room air.  WBC 2.8, hemoglobin 10.2, platelets 61,000.  Sodium 138, potassium 3.4, serum creatinine 1.09.  AST 49, ALT 21, alk phosphatase 96, total bilirubin 3.2.  PCT was 0.26.  Chest x-ray showed increased bibasilar interstitial markings.  The patient was started on IV furosemide.  Assessment and Plan: Decompensated liver cirrhosis with anasarca -Increase IV furosemide to 80 IV bid -many inconsistencies regarding how he takes furosemide at home -previous discharges show lasix 60 po bid, but pt taking only 40 mg bid -suspect a degree of dietary/fluid  indiscretion contributing to his frequent decompensations -Low-sodium diet -Daily weights -Patient is not felt to be a TIPS or transplant candidate secondary to comorbidities -continue rifaximin -previously evaluated by [facility] for liver transplant, though not a candidate due to his BMI, multi-morbidities.   -d/c home with torsemide 40 mg bid   Hypokalemia -repleted -check mag--2.1 -on daily KCl 40 mEq   Pancytopenia -due to liver cirrhosis -monitor for signs of bleeding   Diabetes mellitus type 2 -not on any agents at home -check A1C--5.1   Essential hypertension Holding coreg temporarily to allow BP margin for diuresis>>restart after d/c   Hypothyroidism Continue Synthroid   Depression/anxiety Continue Zoloft/hydroxyzine   Pancytopenia Chronic secondary to liver cirrhosis        Consultants: none Procedures performed: none  Disposition: Home Diet recommendation:  Carb modified diet DISCHARGE MEDICATION: Allergies as of 04/10/2022   No Known Allergies      Medication List     STOP taking these medications    furosemide 20 MG tablet Commonly known as: Lasix   furosemide 40 MG tablet Commonly known as: LASIX       TAKE these medications    acetaminophen 500 MG tablet Commonly known as: TYLENOL Take 1,000 mg by mouth every 6 (six) hours as needed for moderate pain.   albuterol 108 (90 Base) MCG/ACT inhaler Commonly known as: VENTOLIN HFA Inhale 2 puffs into the lungs every 6 (six) hours as needed for wheezing or shortness of breath.   carvedilol 6.25 MG tablet Commonly known as: COREG Take 1 tablet (6.25 mg total) by mouth 2 (two) times daily with a meal.   gabapentin 100 MG  capsule Commonly known as: NEURONTIN Take 100 mg by mouth daily as needed (pain).   hydrOXYzine 50 MG capsule Commonly known as: VISTARIL Take 1 capsule (50 mg total) by mouth 3 (three) times daily as needed for anxiety or nausea.   levothyroxine 175 MCG  tablet Commonly known as: SYNTHROID Take 175 mcg by mouth daily before breakfast.   pantoprazole 40 MG tablet Commonly known as: PROTONIX Take 1 tablet (40 mg total) by mouth daily.   potassium chloride SA 20 MEQ tablet Commonly known as: KLOR-CON M Take 2 tablets (40 mEq total) by mouth daily. What changed: when to take this   rifaximin 550 MG Tabs tablet Commonly known as: XIFAXAN Take 1 tablet (550 mg total) by mouth 2 (two) times daily.   sertraline 50 MG tablet Commonly known as: ZOLOFT Take 1 tablet (50 mg total) by mouth at bedtime.   spironolactone 100 MG tablet Commonly known as: ALDACTONE Take 1 tablet (100 mg total) by mouth daily.   Torsemide 40 MG Tabs Take 40 mg by mouth 2 (two) times daily.   traZODone 50 MG tablet Commonly known as: DESYREL Take 1 tablet (50 mg total) by mouth at bedtime.        Discharge Exam: Filed Weights   04/06/22 1544 04/06/22 2031 04/09/22 0351  Weight: (!) 163.3 kg (!) 177.4 kg (!) 173.7 kg   HEENT:  [place]/AT, No thrush, no icterus CV:  RRR, no rub, no S3, no S4 Lung:  CTA, no wheeze, no rhonchi Abd:  soft/+BS, NT Ext:  trace LE edema, no lymphangitis, no synovitis, no rash   Condition at discharge: stable  The results of significant diagnostics from this hospitalization (including imaging, microbiology, ancillary and laboratory) are listed below for reference.   Imaging Studies: DG Chest 2 View  Result Date: 04/06/2022 CLINICAL DATA:  Shortness of breath EXAM: CHEST - 2 VIEW COMPARISON:  02/27/2022 FINDINGS: Stable cardiomediastinal contours. Mildly increased bibasilar interstitial markings. No pleural effusion or pneumothorax. IMPRESSION: Mildly increased bibasilar interstitial markings, which [place] reflect atelectasis or developing infiltrate. Electronically Signed   By: Davina Poke D.O.   On: 04/06/2022 16:08   US ABDOMEN LIMITED RUQ (LIVER/GB)  Result Date: 03/22/2022 CLINICAL DATA:  Cirrhosis.  Prior  cholecystectomy. EXAM: ULTRASOUND ABDOMEN LIMITED RIGHT UPPER QUADRANT COMPARISON:  Ultrasound abdomen January 24, 2012; CT abdomen pelvis September 20, 2021 FINDINGS: Gallbladder: Surgically absent Common bile duct: Diameter: 3.3 mm Liver: Coarsened echogenicity and nodular contour. No focal lesion. Portal vein is patent on color Doppler imaging with normal direction of blood flow towards the liver. Other: None. IMPRESSION: Morphologic changes to the liver suggestive of cirrhosis. No focal lesion. Electronically Signed   By: Lovey Newcomer M.D.   On: 03/22/2022 08:49    Microbiology: Results for orders placed or performed during the hospital encounter of 02/27/22  Resp Panel by RT-PCR (Flu A&B, Covid) Anterior Nasal Swab     Status: None   Collection Time: 02/27/22  6:48 PM   Specimen: Anterior Nasal Swab  Result Value Ref Range Status   SARS Coronavirus 2 by RT PCR NEGATIVE NEGATIVE Final    Comment: (NOTE) SARS-CoV-2 target nucleic acids are NOT DETECTED.  The SARS-CoV-2 RNA is generally detectable in upper respiratory specimens during the acute phase of infection. The lowest concentration of SARS-CoV-2 viral copies this assay can detect is 138 copies/mL. A negative result does not preclude SARS-Cov-2 infection and should not be used as the sole basis for treatment or other patient  management decisions. A negative result Callens occur with  improper specimen collection/handling, submission of specimen other than nasopharyngeal swab, presence of viral mutation(s) within the areas targeted by this assay, and inadequate number of viral copies(<138 copies/mL). A negative result must be combined with clinical observations, patient history, and epidemiological information. The expected result is Negative.  Fact Sheet for Patients:  EntrepreneurPulse.com.au  Fact Sheet for Healthcare Providers:  IncredibleEmployment.be  This test is no t yet approved or cleared by  the [place] FDA and  has been authorized for detection and/or diagnosis of SARS-CoV-2 by FDA under an Emergency Use Authorization (EUA). This EUA will remain  in effect (meaning this test can be used) for the duration of the COVID-19 declaration under Section 564(b)(1) of the Act, 21 U.S.C.section 360bbb-3(b)(1), unless the authorization is terminated  or revoked sooner.       Influenza A by PCR NEGATIVE NEGATIVE Final   Influenza B by PCR NEGATIVE NEGATIVE Final    Comment: (NOTE) The Xpert Xpress SARS-CoV-2/FLU/RSV plus assay is intended as an aid in the diagnosis of influenza from Nasopharyngeal swab specimens and should not be used as a sole basis for treatment. Nasal washings and aspirates are unacceptable for Xpert Xpress SARS-CoV-2/FLU/RSV testing.  Fact Sheet for Patients: EntrepreneurPulse.com.au  Fact Sheet for Healthcare Providers: IncredibleEmployment.be  This test is not yet approved or cleared by the [place] FDA and has been authorized for detection and/or diagnosis of SARS-CoV-2 by FDA under an Emergency Use Authorization (EUA). This EUA will remain in effect (meaning this test can be used) for the duration of the COVID-19 declaration under Section 564(b)(1) of the Act, 21 U.S.C. section 360bbb-3(b)(1), unless the authorization is terminated or revoked.  Performed at [facility], [address], [place], [place] [address]     Labs: CBC: Recent Labs  Lab 04/06/22 1626 04/07/22 0403 04/08/22 0458  WBC 2.8* 2.8* 2.6*  HGB 10.9* 10.2* 10.1*  HCT 33.7* 31.2* 30.5*  MCV 90.8 89.4 90.0  PLT 65* 61* 57*   Basic Metabolic Panel: Recent Labs  Lab 04/06/22 1626 04/07/22 0403 04/08/22 0458 04/09/22 0519 04/10/22 0552  NA 135 138 136 136 136  K 3.9 3.4* 3.3* 3.3* 3.4*  CL 102 104 102 99 97*  CO2 '25 26 27 27 29  '$ GLUCOSE 189* 112* 110* 112* 104*  BUN '15 14 14 12 14  '$ CREATININE 1.19 1.09 1.13 1.15 1.26*   CALCIUM 8.3* 8.2* 8.2* 8.3* 8.3*  MG  --  2.1 2.1 2.0 1.9  PHOS  --  3.3  --   --   --    Liver Function Tests: Recent Labs  Lab 04/07/22 0403  AST 49*  ALT 21  ALKPHOS 96  BILITOT 3.2*  PROT 6.1*  ALBUMIN 3.0*   CBG: No results for input(s): "GLUCAP" in the last 168 hours.  Discharge time spent: greater than 30 minutes.  Signed: Orson Eva, MD [facility] 04/10/2022

## 2022-04-10 NOTE — Care Management Important Message (Signed)
Important Message  Patient Details  Name: Daniel Crawford MRN: [id number] Date of Birth: Theys 29, 1957   Medicare Important Message Given:  N/A - LOS <3 / Initial given by admissions     Tommy Medal 04/10/2022, 12:01 PM

## 2022-04-17 DIAGNOSIS — I509 Heart failure, unspecified: Secondary | ICD-10-CM | POA: Diagnosis not present

## 2022-04-17 DIAGNOSIS — E039 Hypothyroidism, unspecified: Secondary | ICD-10-CM | POA: Diagnosis not present

## 2022-04-17 DIAGNOSIS — R188 Other ascites: Secondary | ICD-10-CM | POA: Diagnosis not present

## 2022-04-17 DIAGNOSIS — K7460 Unspecified cirrhosis of liver: Secondary | ICD-10-CM | POA: Diagnosis not present

## 2022-04-17 DIAGNOSIS — Z6841 Body Mass Index (BMI) 40.0 and over, adult: Secondary | ICD-10-CM | POA: Diagnosis not present

## 2022-04-17 DIAGNOSIS — K7290 Hepatic failure, unspecified without coma: Secondary | ICD-10-CM | POA: Diagnosis not present

## 2022-04-26 ENCOUNTER — Emergency Department (HOSPITAL_COMMUNITY)
Admission: EM | Admit: 2022-04-26 | Discharge: 2022-04-26 | Disposition: A | Discharge status: Home / Self Care | Payer: Medicare HMO | Attending: Emergency Medicine | Admitting: Emergency Medicine

## 2022-04-26 ENCOUNTER — Other Ambulatory Visit: Payer: Self-pay

## 2022-04-26 ENCOUNTER — Encounter (HOSPITAL_COMMUNITY): Payer: Self-pay

## 2022-04-26 ENCOUNTER — Emergency Department (HOSPITAL_COMMUNITY): Payer: Medicare HMO

## 2022-04-26 ENCOUNTER — Other Ambulatory Visit: Payer: Self-pay | Admitting: Interventional Radiology

## 2022-04-26 DIAGNOSIS — R188 Other ascites: Secondary | ICD-10-CM

## 2022-04-26 DIAGNOSIS — I509 Heart failure, unspecified: Secondary | ICD-10-CM | POA: Diagnosis not present

## 2022-04-26 DIAGNOSIS — R609 Edema, unspecified: Secondary | ICD-10-CM | POA: Diagnosis not present

## 2022-04-26 DIAGNOSIS — K7581 Nonalcoholic steatohepatitis (NASH): Secondary | ICD-10-CM | POA: Diagnosis not present

## 2022-04-26 DIAGNOSIS — Z515 Encounter for palliative care: Secondary | ICD-10-CM | POA: Diagnosis not present

## 2022-04-26 DIAGNOSIS — R0602 Shortness of breath: Secondary | ICD-10-CM | POA: Insufficient documentation

## 2022-04-26 DIAGNOSIS — I5032 Chronic diastolic (congestive) heart failure: Secondary | ICD-10-CM | POA: Diagnosis not present

## 2022-04-26 DIAGNOSIS — K7460 Unspecified cirrhosis of liver: Secondary | ICD-10-CM | POA: Diagnosis not present

## 2022-04-26 DIAGNOSIS — E039 Hypothyroidism, unspecified: Secondary | ICD-10-CM | POA: Diagnosis not present

## 2022-04-26 LAB — COMPREHENSIVE METABOLIC PANEL
ALT: 27 U/L (ref 0–44)
AST: 59 U/L — ABNORMAL HIGH (ref 15–41)
Albumin: 3.1 g/dL — ABNORMAL LOW (ref 3.5–5.0)
Alkaline Phosphatase: 112 U/L (ref 38–126)
Anion gap: 7 (ref 5–15)
BUN: 15 mg/dL (ref 8–23)
CO2: 27 mmol/L (ref 22–32)
Calcium: 8.5 mg/dL — ABNORMAL LOW (ref 8.9–10.3)
Chloride: 101 mmol/L (ref 98–111)
Creatinine, Ser: 1.2 mg/dL (ref 0.61–1.24)
GFR, Estimated: >60 mL/min (ref >60)
Glucose, Bld: 143 mg/dL — ABNORMAL HIGH (ref 70–99)
Potassium: 4.3 mmol/L (ref 3.5–5.1)
Sodium: 135 mmol/L (ref 135–145)
Total Bilirubin: 3.3 mg/dL — ABNORMAL HIGH (ref 0.3–1.2)
Total Protein: 6.6 g/dL (ref 6.5–8.1)

## 2022-04-26 LAB — CBC
HCT: 34.4 % — ABNORMAL LOW (ref 39.0–52.0)
Hemoglobin: 11 g/dL — ABNORMAL LOW (ref 13.0–17.0)
MCH: 29.1 pg (ref 26.0–34.0)
MCHC: 32 g/dL (ref 30.0–36.0)
MCV: 91 fL (ref 80.0–100.0)
Platelets: 63 10*3/uL — ABNORMAL LOW (ref 150–400)
RBC: 3.78 MIL/uL — ABNORMAL LOW (ref 4.22–5.81)
RDW: 16.5 % — ABNORMAL HIGH (ref 11.5–15.5)
WBC: 3.4 10*3/uL — ABNORMAL LOW (ref 4.0–10.5)
nRBC: 0 % (ref 0.0–0.2)

## 2022-04-26 LAB — BRAIN NATRIURETIC PEPTIDE: B Natriuretic Peptide: 41 pg/mL (ref 0.0–100.0)

## 2022-04-26 NOTE — ED Notes (Signed)
AVS provided to and discussed with patient. Pt verbalizes understanding of discharge instructions and denies any questions or concerns at this time. Pt ambulated out of department independently with steady gait.  

## 2022-04-26 NOTE — Discharge Instructions (Signed)
Your weight is up a little bit but overall your workup is reassuring.  Follow-up with your doctor.  Your x-ray does not show any fluid in your lungs.

## 2022-04-26 NOTE — ED Triage Notes (Signed)
Pt reports he had a follow up with his PCP today from his admission 2 weeks ago for fluid rentention and was told to come back to ER because he gained 20lbs.  Pt weight today is only 2-3 lbs heavier then last weight on discharge.

## 2022-04-26 NOTE — ED Notes (Signed)
Pt sent by PCP for concerns of 20 lb weight gain with recent admission for CHF exacerbation. Pt denies SOB, CP, or significant swelling to extremities. Weight here is similar to discharge weight.

## 2022-04-26 NOTE — ED Provider Notes (Signed)
[facility] Provider Note   CSN: [id number] Arrival date & time: 04/26/22  1451     History  Chief Complaint  Patient presents with   fluid retention    Daniel Crawford is a 67 y.o. male.  HPI Patient sent in after follow-up visit from recent CHF admission.  Was volume overloaded also from cirrhosis.  Reportedly has put on 20 pounds by the scale at the doctor's office.  However states at home his scale was only a few pounds.  Reviewed notes with recent discharge summary.  Patient's weight today is 176 kg and on discharge it was 173.7.  States he does feel as if he is a little more weight on him.  However no real worsening of shortness of breath.  States he [place] feel little more short of breath.  No fevers.  No coughing.  Has been taking his fluid pills at home but states he did not take it today because he had to go out and otherwise he urinates every 10 minutes.    Home Medications Prior to Admission medications   Medication Sig Start Date End Date Taking? Authorizing Provider  acetaminophen (TYLENOL) 500 MG tablet Take 1,000 mg by mouth every 6 (six) hours as needed for moderate pain.    [provider]  albuterol (VENTOLIN HFA) 108 (90 Base) MCG/ACT inhaler Inhale 2 puffs into the lungs every 6 (six) hours as needed for wheezing or shortness of breath. 03/04/22   Johnson, Clanford L, MD  carvedilol (COREG) 6.25 MG tablet Take 1 tablet (6.25 mg total) by mouth 2 (two) times daily with a meal. 01/25/22   Emokpae, Courage, MD  gabapentin (NEURONTIN) 100 MG capsule Take 100 mg by mouth daily as needed (pain). 02/20/21   [provider]  hydrOXYzine (VISTARIL) 50 MG capsule Take 1 capsule (50 mg total) by mouth 3 (three) times daily as needed for anxiety or nausea. 01/25/22   Roxan Hockey, MD  levothyroxine (SYNTHROID) 175 MCG tablet Take 175 mcg by mouth daily before breakfast.    [provider]  pantoprazole (PROTONIX) 40  MG tablet Take 1 tablet (40 mg total) by mouth daily. 01/25/22   Roxan Hockey, MD  potassium chloride SA (KLOR-CON M) 20 MEQ tablet Take 2 tablets (40 mEq total) by mouth daily. Patient taking differently: Take 40 mEq by mouth 2 (two) times daily. 03/05/22   Johnson, Clanford L, MD  rifaximin (XIFAXAN) 550 MG TABS tablet Take 1 tablet (550 mg total) by mouth 2 (two) times daily. Patient not taking: Reported on 04/06/2022 01/25/22   Roxan Hockey, MD  sertraline (ZOLOFT) 50 MG tablet Take 1 tablet (50 mg total) by mouth at bedtime. 01/25/22   Roxan Hockey, MD  spironolactone (ALDACTONE) 100 MG tablet Take 1 tablet (100 mg total) by mouth daily. 03/04/22   Johnson, Clanford L, MD  torsemide 40 MG TABS Take 40 mg by mouth 2 (two) times daily. 04/10/22   Orson Eva, MD  traZODone (DESYREL) 50 MG tablet Take 1 tablet (50 mg total) by mouth at bedtime. 01/25/22   Roxan Hockey, MD      Allergies    Patient has no known allergies.    Review of Systems   Review of Systems  Physical Exam Updated Vital Signs BP (!) 153/72   Pulse 74   Temp 98.2 F (36.8 C) (Oral)   Resp 16   Ht '6\' 2"'$  (1.88 m)   Wt (!) 176 kg  SpO2 99%   BMI 49.82 kg/m  Physical Exam Vitals and nursing note reviewed.  Eyes:     Pupils: Pupils are equal, round, and reactive to light.  Cardiovascular:     Rate and Rhythm: Regular rhythm.  Pulmonary:     Breath sounds: No wheezing.  Abdominal:     Tenderness: There is no abdominal tenderness.  Musculoskeletal:     Right lower leg: Edema present.     Left lower leg: Edema present.     Comments: Some mild pitting edema bilateral lower extremities.  Skin:    General: Skin is warm.  Neurological:     Mental Status: He is alert and oriented to person, place, and time.     ED Results / Procedures / Treatments   Labs (all labs ordered are listed, but only abnormal results are displayed) Labs Reviewed  COMPREHENSIVE METABOLIC PANEL - Abnormal; Notable for the  following components:      Result Value   Glucose, Bld 143 (*)    Calcium 8.5 (*)    Albumin 3.1 (*)    AST 59 (*)    Total Bilirubin 3.3 (*)    All other components within normal limits  CBC - Abnormal; Notable for the following components:   WBC 3.4 (*)    RBC 3.78 (*)    Hemoglobin 11.0 (*)    HCT 34.4 (*)    RDW 16.5 (*)    Platelets 63 (*)    All other components within normal limits  BRAIN NATRIURETIC PEPTIDE    EKG None  Radiology DG Chest 2 View  Result Date: 04/26/2022 CLINICAL DATA:  Shortness of breath. EXAM: CHEST - 2 VIEW COMPARISON:  April 06, 2022. FINDINGS: The heart size and mediastinal contours are within normal limits. Both lungs are clear. The visualized skeletal structures are unremarkable. IMPRESSION: No active cardiopulmonary disease. Electronically Signed   By: Marijo Conception M.D.   On: 04/26/2022 15:54    Procedures Procedures    Medications Ordered in ED Medications - No data to display  ED Course/ Medical Decision Making/ A&P                             Medical Decision Making Amount and/or Complexity of Data Reviewed Labs: ordered. Radiology: ordered.   Patient with mild shortness of breath.  Sent in because her weight was reportedly up 20 pounds.  However according to the weights from recent discharge she is up about 5 pounds.  States he Richter be a little more short of breath but not significantly.  Will check basic blood work and x-ray.  Reviewed recent discharge note.  Blood work reassuring.  X-ray reassuring.  Eager to go home.  Is not up 20 pounds as had been thought.  Appears stable for discharge home.        Final Clinical Impression(s) / ED Diagnoses Final diagnoses:  NASH (nonalcoholic steatohepatitis)    Rx / DC Orders ED Discharge Orders     None         Davonna Belling, MD 04/26/22 2259

## 2022-05-01 ENCOUNTER — Telehealth: Payer: Self-pay | Admitting: *Deleted

## 2022-05-01 MED ORDER — ALBUTEROL SULFATE HFA 108 (90 BASE) MCG/ACT IN AERS: 2.0000 | INHALATION_SPRAY | Freq: Four times a day (QID) | RESPIRATORY_TRACT | 2 refills | Status: DC | PRN

## 2022-05-01 NOTE — Telephone Encounter (Signed)
Refill sent.  Nothing further needed

## 2022-05-01 NOTE — Telephone Encounter (Signed)
Patient last seen 09/2020 by RA, scheduled appointment for 06/2022 but in the meantime patient would like to see if he can get an albuterol prescription sent over to [facility]. Please call and advise patient.   Dr. Elsworth Soho are you okay with this?

## 2022-05-03 ENCOUNTER — Telehealth: Payer: Self-pay

## 2022-05-03 NOTE — Telephone Encounter (Signed)
        Patient  visited [place] on 2/1    Telephone encounter attempt :  1st  Unable to [place], [place] [phone] [address], [place],  [address] Phone: [phone] Email: [email or web address]

## 2022-05-04 ENCOUNTER — Telehealth: Payer: Self-pay

## 2022-05-04 NOTE — Telephone Encounter (Signed)
        Patient  visited  [place] on 2/1    Telephone encounter attempt :2nd    A HIPAA compliant voice message was left requesting a return call.  Instructed patient to call back.    Odis Wickey [facility] Guide, [place] [phone] [address], [place], [place] [address] Phone: [phone] Email: [email or web address]       

## 2022-05-09 DIAGNOSIS — I5032 Chronic diastolic (congestive) heart failure: Secondary | ICD-10-CM | POA: Diagnosis not present

## 2022-05-09 DIAGNOSIS — Z515 Encounter for palliative care: Secondary | ICD-10-CM | POA: Diagnosis not present

## 2022-05-09 DIAGNOSIS — K7581 Nonalcoholic steatohepatitis (NASH): Secondary | ICD-10-CM | POA: Diagnosis not present

## 2022-05-14 DIAGNOSIS — R7989 Other specified abnormal findings of blood chemistry: Secondary | ICD-10-CM | POA: Diagnosis not present

## 2022-05-14 DIAGNOSIS — N179 Acute kidney failure, unspecified: Secondary | ICD-10-CM | POA: Diagnosis not present

## 2022-05-14 DIAGNOSIS — E8770 Fluid overload, unspecified: Secondary | ICD-10-CM | POA: Diagnosis not present

## 2022-05-14 DIAGNOSIS — K7460 Unspecified cirrhosis of liver: Secondary | ICD-10-CM | POA: Diagnosis not present

## 2022-05-16 IMAGING — US US ABDOMEN LIMITED
1 series · 14 of 25 positions shown · non-contrast
Comparison: CT abdomen pelvis 04/18/2021

CLINICAL DATA: Screen for cirrhosis

EXAM:
ULTRASOUND ABDOMEN LIMITED RIGHT UPPER QUADRANT

[Series 1: us abdomen limited ruq (liver/gb) · 14 of 32 slices shown]
[im 1/32]
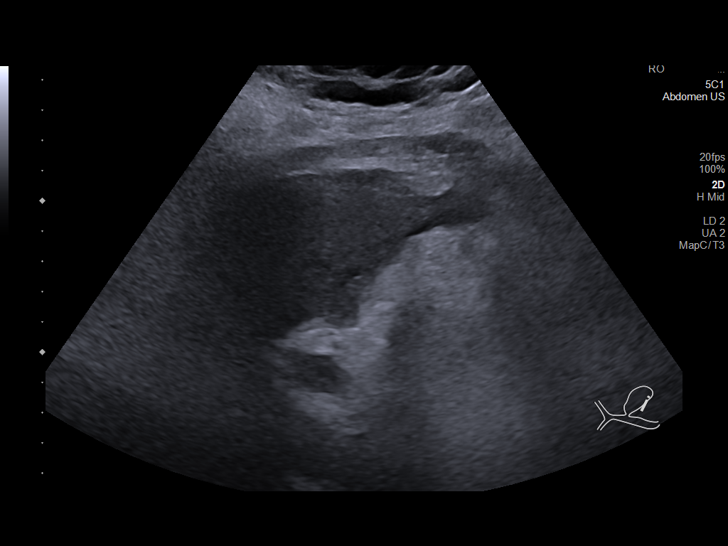
[im 3/32]
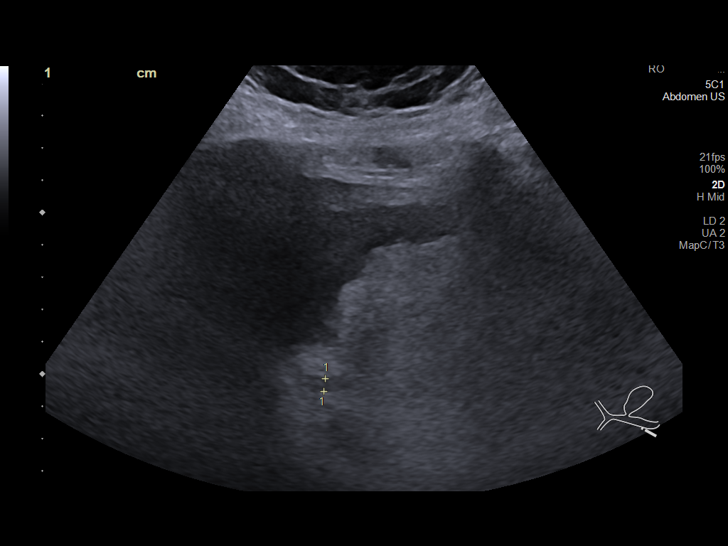
[im 6/32]
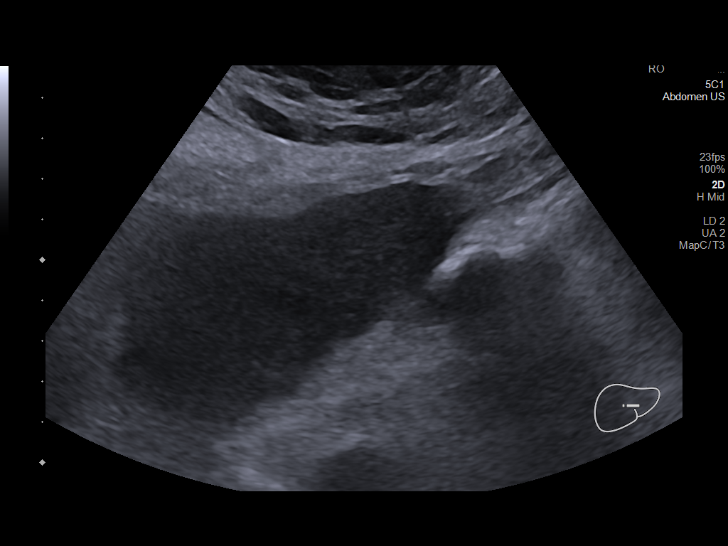
[im 8/32]
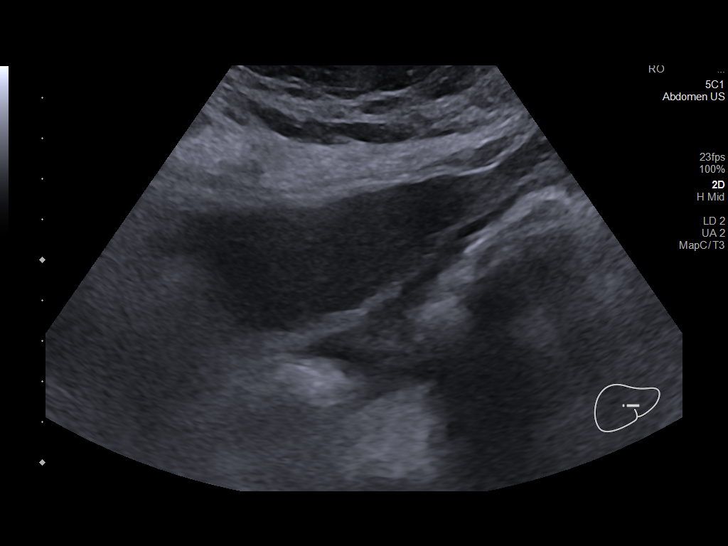
[im 11/32]
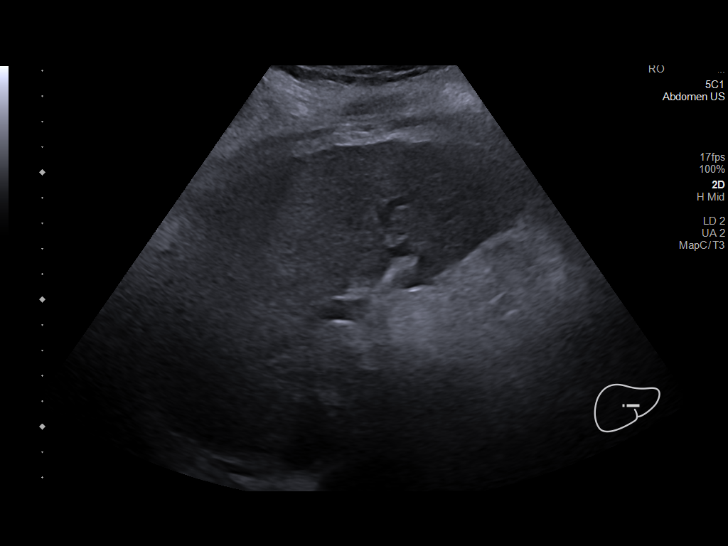
[im 12/32]
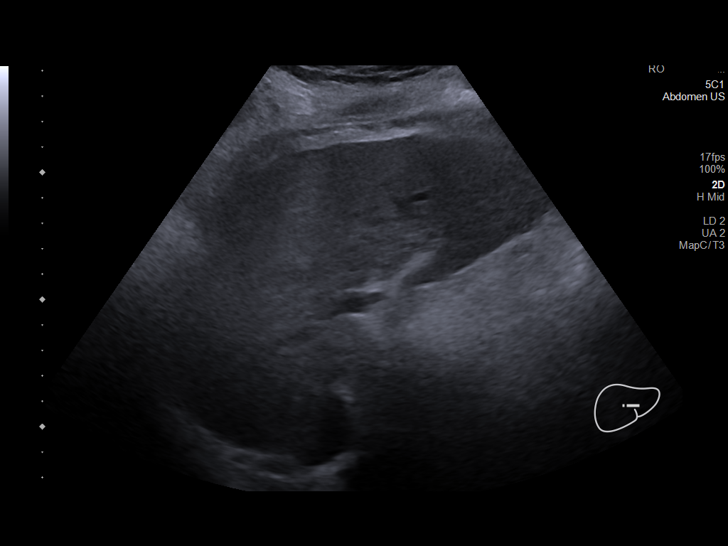
[im 15/32]
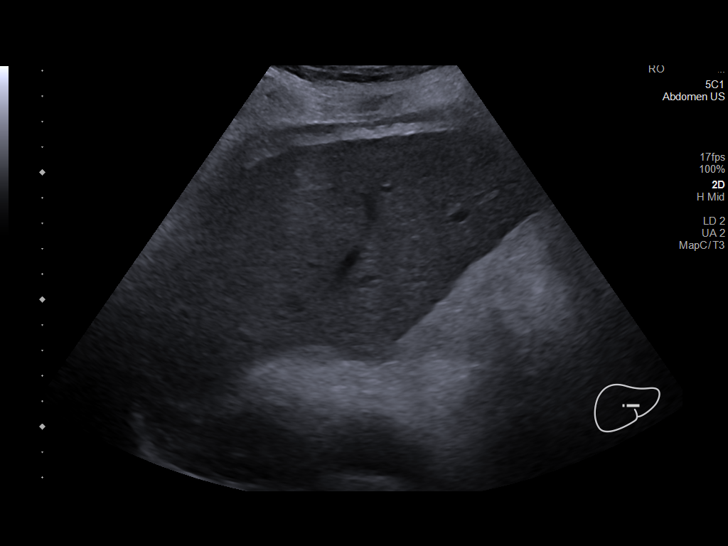
[im 17/32]
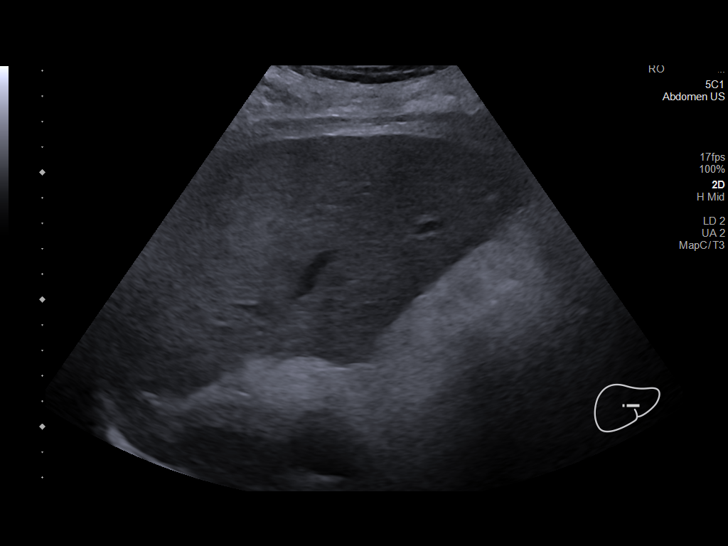
[im 20/32]
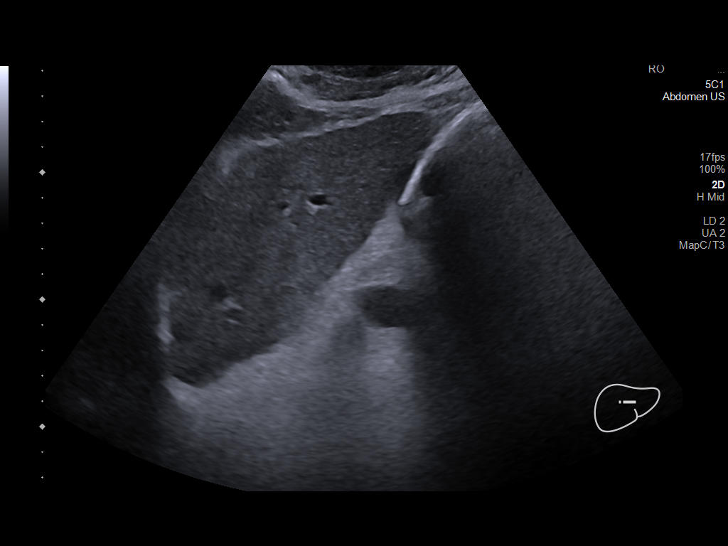
[im 21/32]
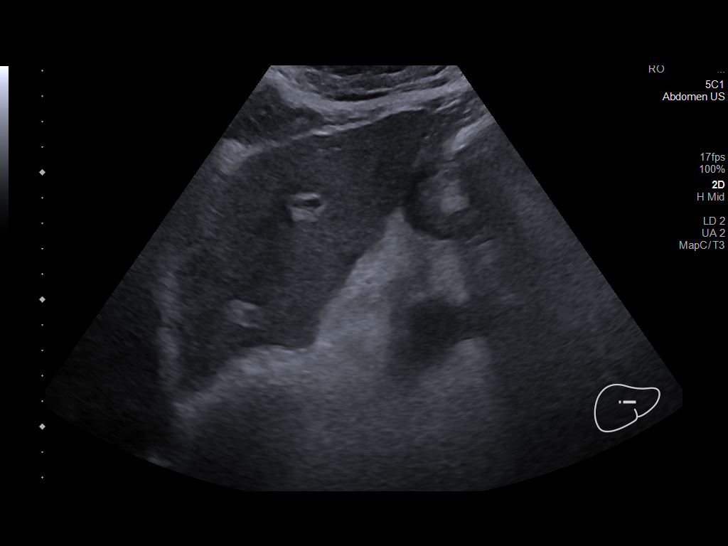
[im 24/32]
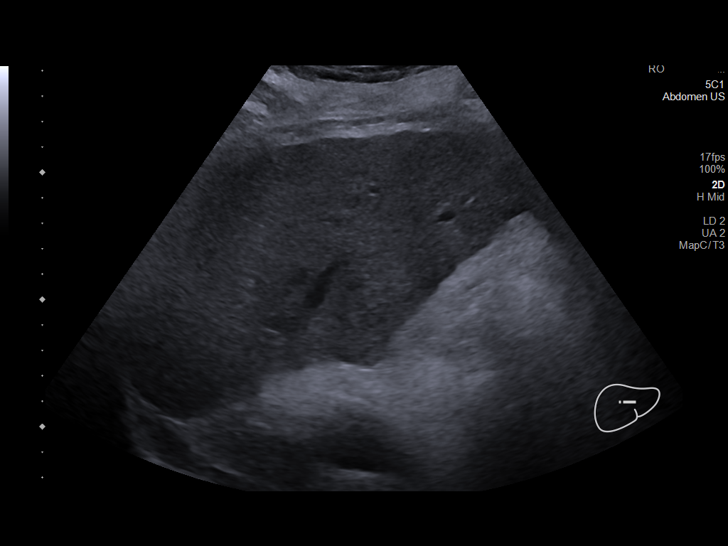
[im 26/32]
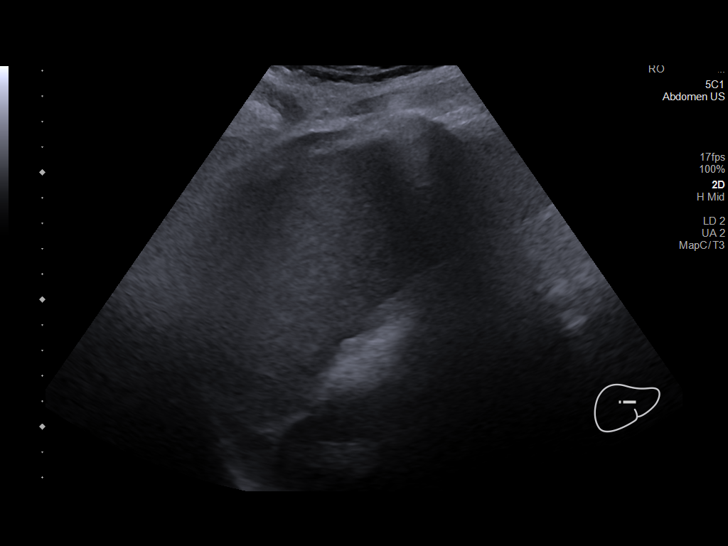
[im 29/32]
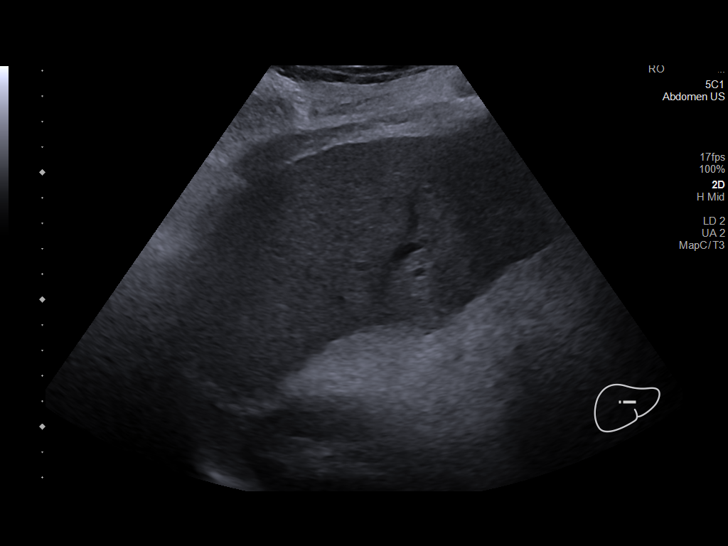
[im 32/32]
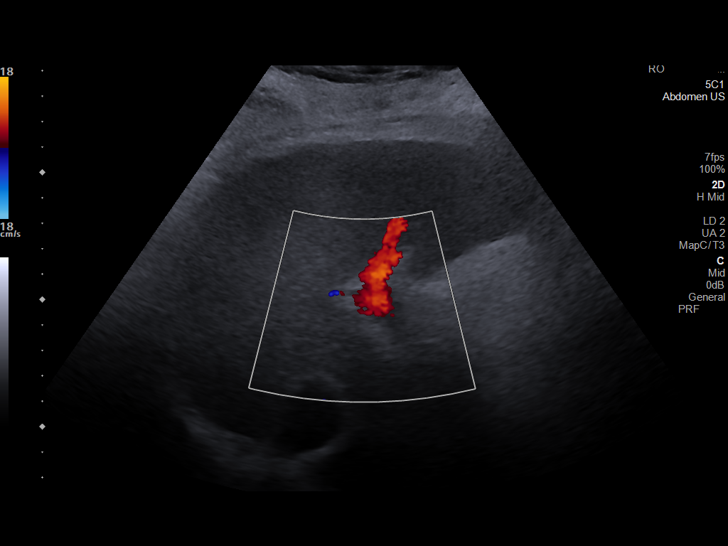

[14 of 25 positions shown; findings below may reference images not displayed]

FINDINGS: Gallbladder:

Status post cholecystectomy

Common bile duct:

Diameter: 4 mm mm

Liver:

Parenchymal echogenicity: Mild diffuse increased heterogeneity.
Nodular hepatic contours.

Lesions: None

Portal vein: Patent.  Hepatopetal flow

Other: None.
IMPRESSION: Cirrhotic liver morphology without focal hepatic lesion.

## 2022-05-17 ENCOUNTER — Ambulatory Visit (INDEPENDENT_AMBULATORY_CARE_PROVIDER_SITE_OTHER): Payer: Medicare HMO | Admitting: Gastroenterology

## 2022-05-17 ENCOUNTER — Encounter: Payer: Self-pay | Admitting: Gastroenterology

## 2022-05-17 VITALS — BP 120/69 | HR 68 | Temp 97.4°F | Ht 74.0 in | Wt >= 6400 oz

## 2022-05-17 DIAGNOSIS — K7581 Nonalcoholic steatohepatitis (NASH): Secondary | ICD-10-CM | POA: Diagnosis not present

## 2022-05-17 DIAGNOSIS — K7460 Unspecified cirrhosis of liver: Secondary | ICD-10-CM | POA: Diagnosis not present

## 2022-05-17 DIAGNOSIS — R601 Generalized edema: Secondary | ICD-10-CM | POA: Diagnosis not present

## 2022-05-17 NOTE — Patient Instructions (Signed)
Please have blood work done today.   If you continue to gain weight, please go to the emergency room.  Continue to weigh daily.  Continue to avoid any added salt and limit salt to 2 grams total per day.   Further recommendations after labs are reviewed!  I enjoyed seeing you again today! At our first visit, I mentioned how I value our relationship and want to provide genuine, compassionate, and quality care. You [place] receive a survey regarding your visit with me, and I welcome your feedback! Thanks so much for taking the time to complete this. I look forward to seeing you again.   Annitta Needs, PhD, ANP-BC [facility]

## 2022-05-17 NOTE — Progress Notes (Addendum)
Gastroenterology Office Note     Primary Care Physician:  [facility]  Primary Gastroenterologist: Dr. Jenetta Downer    Chief Complaint   Chief Complaint  Patient presents with   Follow-up    ED follow up     History of Present Illness   Daniel Crawford is a 67 y.o. male presenting today in follow-up with a history of  NASH cirrhosis, HE, decompensation with anasarca. Not a TIPs candidate currently; not a liver transplant candidate. Chronically elevated AFP   Needs EGD and colonoscopy. History of colonoscopy last year with polyp unable to be reached. Needs AFP tumor marker. Next [place] in June 2024.   Current diuretic regimen: Torsemide 40 mg, aldactone 100 mg. States he urinated 3 gallons yesterday. Saw Nephrologist on Monday. Just started torsemide on Monday. Wants to avoid the hospital. Declining Nutrition evaluation. Frustrated because he has no energy to do anything. Feels SOB with exertion. Walking to the dumpster causes fatigue and SOB. He is wanting to continue this diuretic regimen for a few more days before going to the hospital again. Does not want to be assessed for para. Limiting salt. No added salt. No canned veggies. Frustrated due to weight and unable to lose adequately. Interested in [place] potentially. No mental status changes or confusion. No abdominal pain. Feels tight. No overt GI bleeding. Has chronic serous fluid draining from umbilicus.    On 03/13/22 was 365 Apr 09, 2022 was 383 Today 400.2.    Last EGD: 11/06/2019 - Normal esophagus. Dilated. - Portal hypertensive gastropathy. - Normal examined duodenum. - No specimens collected.   Last Colonoscopy:06/28/2021  - One medium size sessile polyp in the cecum. This polyp could not be removed because of poor approach. - Five 4 to 7 mm polyps in the transverse colon and at the hepatic flexure, removed with a cold snare. Resected and retrieved. - External and internal hemorrhoids.    Path: All polyps were tubular adenomas   Past Medical History:  Diagnosis Date   Anemia    Anxiety    Asthma    CHF (congestive heart failure) ([place])    CKD (chronic kidney disease)    Depression    Diabetes mellitus without complication ([place])    Dyspnea    Hypertension    Hypothyroidism    Liver cirrhosis secondary to NASH ([place])    NASH (nonalcoholic steatohepatitis)    Pre-diabetes    Spleen enlarged    Thrombocytopenia ([place])     Past Surgical History:  Procedure Laterality Date   Bilateral hernia surgery     2006, 2007   CHOLECYSTECTOMY     2016    COLONOSCOPY WITH PROPOFOL N/A 06/28/2021   Procedure: COLONOSCOPY WITH PROPOFOL;  Surgeon: Rogene Houston, MD;  [facility];  Service: Endoscopy;  Laterality: N/A;  1020   ESOPHAGEAL DILATION N/A 11/06/2019   Procedure: ESOPHAGEAL DILATION;  Surgeon: Harvel Quale, MD;  [facility];  Service: Gastroenterology;  Laterality: N/A;   ESOPHAGOGASTRODUODENOSCOPY (EGD) WITH PROPOFOL N/A 11/06/2019   Procedure: ESOPHAGOGASTRODUODENOSCOPY (EGD) WITH PROPOFOL;  Surgeon: Harvel Quale, MD;  [facility];  Service: Gastroenterology;  Laterality: N/A;  1045   IR RADIOLOGIST EVAL & MGMT  02/19/2022   LAPAROSCOPIC ASSISTED VENTRAL HERNIA REPAIR     Polyp removed     in January of 2018.    POLYPECTOMY  06/28/2021   Procedure: POLYPECTOMY INTESTINAL;  Surgeon: Rogene Houston, MD;  [facility];  Service: Endoscopy;;   RIGHT HEART CATH N/A 01/08/2022   Procedure: RIGHT HEART CATH;  Surgeon: Jettie Booze, MD;  Location: [place] [facility];  Service: Cardiovascular;  Laterality: N/A;    Current Outpatient Medications  Medication Sig Dispense Refill   acetaminophen (TYLENOL) 500 MG tablet Take 1,000 mg by mouth every 6 (six) hours as needed for moderate pain.     albuterol (VENTOLIN HFA) 108 (90 Base) MCG/ACT inhaler Inhale 2 puffs into the lungs every 6 (six) hours  as needed for wheezing or shortness of breath. 18 g 2   carvedilol (COREG) 6.25 MG tablet Take 1 tablet (6.25 mg total) by mouth 2 (two) times daily with a meal. 180 tablet 3   eplerenone (INSPRA) 50 MG tablet Take 50 mg by mouth daily.     furosemide (LASIX) 40 MG tablet Take 40 mg by mouth.     gabapentin (NEURONTIN) 100 MG capsule Take 100 mg by mouth daily as needed (pain).     hydrOXYzine (VISTARIL) 50 MG capsule Take 1 capsule (50 mg total) by mouth 3 (three) times daily as needed for anxiety or nausea. 90 capsule 2   levothyroxine (SYNTHROID) 175 MCG tablet Take 175 mcg by mouth daily before breakfast.     ondansetron (ZOFRAN) 8 MG tablet Take by mouth every 8 (eight) hours as needed for nausea or vomiting.     pantoprazole (PROTONIX) 40 MG tablet Take 1 tablet (40 mg total) by mouth daily. 90 tablet 3   potassium chloride SA (KLOR-CON M) 20 MEQ tablet Take 2 tablets (40 mEq total) by mouth daily. (Patient taking differently: Take 40 mEq by mouth 2 (two) times daily.) 60 tablet 0   rifaximin (XIFAXAN) 550 MG TABS tablet Take 1 tablet (550 mg total) by mouth 2 (two) times daily. 180 tablet 3   sertraline (ZOLOFT) 50 MG tablet Take 1 tablet (50 mg total) by mouth at bedtime. 90 tablet 3   spironolactone (ALDACTONE) 100 MG tablet Take 1 tablet (100 mg total) by mouth daily. 30 tablet 1   torsemide 40 MG TABS Take 40 mg by mouth 2 (two) times daily. (Patient taking differently: Take 40 mg by mouth daily.) 60 tablet 1   traZODone (DESYREL) 50 MG tablet Take 1 tablet (50 mg total) by mouth at bedtime. 90 tablet 3   No current facility-administered medications for this visit.    Allergies as of 05/17/2022   (No Known Allergies)    Family History  Problem Relation Age of Onset   Liver cancer Mother    Heart disease Mother    Aneurysm Sister     Social History   Socioeconomic History   Marital status: Single    Spouse name: Not on file   Number of children: Not on file   Years of  education: Not on file   Highest education level: Not on file  Occupational History   Not on file  Tobacco Use   Smoking status: Former    Packs/day: 1.00    Years: 20.00    Total pack years: 20.00    Types: Cigarettes    Quit date: 2017    Years since quitting: 7.1   Smokeless tobacco: Never  Vaping Use   Vaping Use: Never used  Substance and Sexual Activity   Alcohol use: No   Drug use: No   Sexual activity: Not on file  Other Topics Concern   Not on file  Social History  Narrative   Not on file   Social Determinants of Health   Financial Resource Strain: Not on file  Food Insecurity: No Food Insecurity (04/06/2022)   Hunger Vital Sign    Worried About Running Out of Food in the Last Year: Never true    Ran Out of Food in the Last Year: Never true  Transportation Needs: No Transportation Needs (04/06/2022)   PRAPARE - Hydrologist (Medical): No    Lack of Transportation (Non-Medical): No  Physical Activity: Not on file  Stress: Not on file  Social Connections: Not on file  Intimate Partner Violence: Not At Risk (04/06/2022)   Humiliation, Afraid, Rape, and Kick questionnaire    Fear of Current or Ex-Partner: No    Emotionally Abused: No    Physically Abused: No    Sexually Abused: No     Review of Systems   See HPI   Physical Exam   BP 120/69   Pulse 68   Temp (!) 97.4 F (36.3 C)   Ht '6\' 2"'$  (1.88 m)   Wt (!) 400 lb 3.2 oz (181.5 kg)   BMI 51.38 kg/m  General:   Alert and oriented. Pleasant and cooperative. Fatigued.  Head:  Normocephalic and atraumatic. Abdomen:  +BS, obese, anasarca to flanks, protruding umbilical hernia with serous drainage. No TTP. Moderately tense Rectal:  Deferred  Msk:  Symmetrical without gross deformities. Normal posture. Extremities:  With marked edema to thigh Neurologic:  Alert and  oriented x4;  grossly normal neurologically. Psych:  Alert and cooperative. Normal mood and  affect.   Assessment   Daniel Crawford is a 67 y.o. male presenting today in follow-up with a history o NASH cirrhosis, HE, decompensation with anasarca. Not a TIPs candidate currently; not a liver transplant candidate.    Decompensated cirrhosis: here today with obvious anasarca. Weight 400 today, up from 383 in ED and was actually 365 in Dec 2023 at last outpatient appt. Diuretic therapy just changed a few days ago by Nephrology from furosemide to torsemide. Currently taking Torsemide 40 mg daily and spironolactone 100 mg daily. Will need to check labs today to see that we are maximizing diuretic regimen. I feel he Bernardi need to present to the hospital, but he is wanting to avoid this if at all possible and will continue to weigh at home. Abdomen is distended but appears moreso anasarca than tense ascites.   He ultimately needs colonoscopy/EGD once he is more euvolemic.   PLAN    CBC, CMP, INR, AFP RUQ [place] in June 2024 To ED if any worsening; patient to monitor daily weights Continue current diuretic regimen and will review labs Ultimately needs colonoscopy/EGD when appropriately diuresed 2 gram sodium diet Review labs prior to further recommendations    Annitta Needs, PhD, ANP-BC [facility]   I have reviewed the note and agree with the APP's assessment as described in this progress note  [name], MD Gastroenter[facility] and [facility]

## 2022-05-21 ENCOUNTER — Emergency Department (HOSPITAL_COMMUNITY): Payer: Medicare HMO

## 2022-05-21 ENCOUNTER — Encounter (HOSPITAL_COMMUNITY): Payer: Self-pay | Admitting: *Deleted

## 2022-05-21 ENCOUNTER — Inpatient Hospital Stay (HOSPITAL_COMMUNITY)
Admission: EM | Admit: 2022-05-21 | Discharge: 2022-05-25 | DRG: 433 | Disposition: A | Discharge status: Home / Self Care | Payer: Medicare HMO | Attending: Internal Medicine | Admitting: Internal Medicine

## 2022-05-21 ENCOUNTER — Other Ambulatory Visit: Payer: Self-pay

## 2022-05-21 DIAGNOSIS — F418 Other specified anxiety disorders: Secondary | ICD-10-CM | POA: Diagnosis present

## 2022-05-21 DIAGNOSIS — R601 Generalized edema: Principal | ICD-10-CM | POA: Diagnosis present

## 2022-05-21 DIAGNOSIS — E039 Hypothyroidism, unspecified: Secondary | ICD-10-CM | POA: Diagnosis not present

## 2022-05-21 DIAGNOSIS — K7290 Hepatic failure, unspecified without coma: Secondary | ICD-10-CM | POA: Diagnosis present

## 2022-05-21 DIAGNOSIS — E1122 Type 2 diabetes mellitus with diabetic chronic kidney disease: Secondary | ICD-10-CM | POA: Diagnosis not present

## 2022-05-21 DIAGNOSIS — F32A Depression, unspecified: Secondary | ICD-10-CM | POA: Diagnosis not present

## 2022-05-21 DIAGNOSIS — Z87891 Personal history of nicotine dependence: Secondary | ICD-10-CM

## 2022-05-21 DIAGNOSIS — Z6841 Body Mass Index (BMI) 40.0 and over, adult: Secondary | ICD-10-CM | POA: Diagnosis not present

## 2022-05-21 DIAGNOSIS — D61818 Other pancytopenia: Secondary | ICD-10-CM | POA: Diagnosis not present

## 2022-05-21 DIAGNOSIS — K7460 Unspecified cirrhosis of liver: Principal | ICD-10-CM | POA: Diagnosis present

## 2022-05-21 DIAGNOSIS — J45909 Unspecified asthma, uncomplicated: Secondary | ICD-10-CM | POA: Diagnosis not present

## 2022-05-21 DIAGNOSIS — I272 Pulmonary hypertension, unspecified: Secondary | ICD-10-CM | POA: Diagnosis not present

## 2022-05-21 DIAGNOSIS — F419 Anxiety disorder, unspecified: Secondary | ICD-10-CM | POA: Diagnosis not present

## 2022-05-21 DIAGNOSIS — R188 Other ascites: Secondary | ICD-10-CM | POA: Diagnosis not present

## 2022-05-21 DIAGNOSIS — I129 Hypertensive chronic kidney disease with stage 1 through stage 4 chronic kidney disease, or unspecified chronic kidney disease: Secondary | ICD-10-CM | POA: Diagnosis present

## 2022-05-21 DIAGNOSIS — I509 Heart failure, unspecified: Secondary | ICD-10-CM | POA: Diagnosis present

## 2022-05-21 DIAGNOSIS — Z79890 Hormone replacement therapy (postmenopausal): Secondary | ICD-10-CM | POA: Diagnosis not present

## 2022-05-21 DIAGNOSIS — Z79899 Other long term (current) drug therapy: Secondary | ICD-10-CM | POA: Diagnosis not present

## 2022-05-21 DIAGNOSIS — R0602 Shortness of breath: Secondary | ICD-10-CM | POA: Diagnosis not present

## 2022-05-21 DIAGNOSIS — N1831 Chronic kidney disease, stage 3a: Secondary | ICD-10-CM | POA: Diagnosis present

## 2022-05-21 DIAGNOSIS — Z800 Family history of malignant neoplasm of digestive organs: Secondary | ICD-10-CM

## 2022-05-21 DIAGNOSIS — R69 Illness, unspecified: Secondary | ICD-10-CM | POA: Diagnosis not present

## 2022-05-21 DIAGNOSIS — K7581 Nonalcoholic steatohepatitis (NASH): Secondary | ICD-10-CM | POA: Diagnosis present

## 2022-05-21 DIAGNOSIS — E876 Hypokalemia: Secondary | ICD-10-CM | POA: Diagnosis present

## 2022-05-21 DIAGNOSIS — E66813 Obesity, class 3: Secondary | ICD-10-CM

## 2022-05-21 DIAGNOSIS — Z8249 Family history of ischemic heart disease and other diseases of the circulatory system: Secondary | ICD-10-CM | POA: Diagnosis not present

## 2022-05-21 DIAGNOSIS — I10 Essential (primary) hypertension: Secondary | ICD-10-CM | POA: Diagnosis present

## 2022-05-21 HISTORY — DX: Hepatic failure, unspecified without coma: K74.60

## 2022-05-21 HISTORY — DX: Other pancytopenia: D61.818

## 2022-05-21 LAB — CBC
HCT: 33.5 % — ABNORMAL LOW (ref 39.0–52.0)
Hemoglobin: 10.9 g/dL — ABNORMAL LOW (ref 13.0–17.0)
MCH: 29.5 pg (ref 26.0–34.0)
MCHC: 32.5 g/dL (ref 30.0–36.0)
MCV: 90.5 fL (ref 80.0–100.0)
Platelets: 60 10*3/uL — ABNORMAL LOW (ref 150–400)
RBC: 3.7 MIL/uL — ABNORMAL LOW (ref 4.22–5.81)
RDW: 16 % — ABNORMAL HIGH (ref 11.5–15.5)
WBC: 3.3 10*3/uL — ABNORMAL LOW (ref 4.0–10.5)
nRBC: 0 % (ref 0.0–0.2)

## 2022-05-21 LAB — COMPLETE METABOLIC PANEL WITH GFR
AG Ratio: 0.9 (calc) — ABNORMAL LOW (ref 1.0–2.5)
ALT: 16 U/L (ref 9–46)
AST: 38 U/L — ABNORMAL HIGH (ref 10–35)
Albumin: 3.1 g/dL — ABNORMAL LOW (ref 3.6–5.1)
Alkaline phosphatase (APISO): 115 U/L (ref 35–144)
BUN/Creatinine Ratio: 13 (calc) (ref 6–22)
BUN: 18 mg/dL (ref 7–25)
CO2: 32 mmol/L (ref 20–32)
Calcium: 8.9 mg/dL (ref 8.6–10.3)
Chloride: 102 mmol/L (ref 98–110)
Creat: 1.4 mg/dL — ABNORMAL HIGH (ref 0.70–1.35)
Globulin: 3.4 g/dL (calc) (ref 1.9–3.7)
Glucose, Bld: 120 mg/dL — ABNORMAL HIGH (ref 65–99)
Potassium: 4.2 mmol/L (ref 3.5–5.3)
Sodium: 140 mmol/L (ref 135–146)
Total Bilirubin: 3.3 mg/dL — ABNORMAL HIGH (ref 0.2–1.2)
Total Protein: 6.5 g/dL (ref 6.1–8.1)
eGFR: 56 mL/min/{1.73_m2} — ABNORMAL LOW (ref >=60)

## 2022-05-21 LAB — CBC WITH DIFFERENTIAL/PLATELET
Absolute Monocytes: 356 cells/uL (ref 200–950)
Basophils Absolute: 29 cells/uL (ref 0–200)
Basophils Relative: 0.8 %
Eosinophils Absolute: 248 cells/uL (ref 15–500)
Eosinophils Relative: 6.9 %
HCT: 33.9 % — ABNORMAL LOW (ref 38.5–50.0)
Hemoglobin: 11.5 g/dL — ABNORMAL LOW (ref 13.2–17.1)
Lymphs Abs: 763 cells/uL — ABNORMAL LOW (ref 850–3900)
MCH: 29.7 pg (ref 27.0–33.0)
MCHC: 33.9 g/dL (ref 32.0–36.0)
MCV: 87.6 fL (ref 80.0–100.0)
MPV: 10.1 fL (ref 7.5–12.5)
Monocytes Relative: 9.9 %
Neutro Abs: 2203 cells/uL (ref 1500–7800)
Neutrophils Relative %: 61.2 %
Platelets: 59 10*3/uL — ABNORMAL LOW (ref 140–400)
RBC: 3.87 10*6/uL — ABNORMAL LOW (ref 4.20–5.80)
RDW: 14.9 % (ref 11.0–15.0)
Total Lymphocyte: 21.2 %
WBC: 3.6 10*3/uL — ABNORMAL LOW (ref 3.8–10.8)

## 2022-05-21 LAB — BASIC METABOLIC PANEL
Anion gap: 9 (ref 5–15)
BUN: 19 mg/dL (ref 8–23)
CO2: 30 mmol/L (ref 22–32)
Calcium: 8.2 mg/dL — ABNORMAL LOW (ref 8.9–10.3)
Chloride: 100 mmol/L (ref 98–111)
Creatinine, Ser: 1.39 mg/dL — ABNORMAL HIGH (ref 0.61–1.24)
GFR, Estimated: 57 mL/min — ABNORMAL LOW (ref >60)
Glucose, Bld: 166 mg/dL — ABNORMAL HIGH (ref 70–99)
Potassium: 3.9 mmol/L (ref 3.5–5.1)
Sodium: 139 mmol/L (ref 135–145)

## 2022-05-21 LAB — HEPATIC FUNCTION PANEL
ALT: 21 U/L (ref 0–44)
AST: 44 U/L — ABNORMAL HIGH (ref 15–41)
Albumin: 2.8 g/dL — ABNORMAL LOW (ref 3.5–5.0)
Alkaline Phosphatase: 97 U/L (ref 38–126)
Bilirubin, Direct: 0.9 mg/dL — ABNORMAL HIGH (ref 0.0–0.2)
Indirect Bilirubin: 2.6 mg/dL — ABNORMAL HIGH (ref 0.3–0.9)
Total Bilirubin: 3.5 mg/dL — ABNORMAL HIGH (ref 0.3–1.2)
Total Protein: 6.4 g/dL — ABNORMAL LOW (ref 6.5–8.1)

## 2022-05-21 LAB — PROTIME-INR
INR: 1.3 — ABNORMAL HIGH
Prothrombin Time: 13.3 s — ABNORMAL HIGH (ref 9.0–11.5)

## 2022-05-21 LAB — AFP TUMOR MARKER: AFP-Tumor Marker: 9 ng/mL — ABNORMAL HIGH (ref <6.1)

## 2022-05-21 MED ORDER — ONDANSETRON HCL 4 MG PO TABS: 4.0000 mg | ORAL_TABLET | Freq: Four times a day (QID) | ORAL | Status: DC | PRN

## 2022-05-21 MED ORDER — FUROSEMIDE 10 MG/ML IJ SOLN
80.0000 mg | INTRAMUSCULAR | Status: AC
  Administered 2022-05-21: 80 mg via INTRAVENOUS
  Filled 2022-05-21: qty 8

## 2022-05-21 MED ORDER — FUROSEMIDE 10 MG/ML IJ SOLN
40.0000 mg | Freq: Two times a day (BID) | INTRAMUSCULAR | Status: DC
  Administered 2022-05-22 – 2022-05-23 (×3): 40 mg via INTRAVENOUS
  Filled 2022-05-21 (×3): qty 4

## 2022-05-21 MED ORDER — SERTRALINE HCL 50 MG PO TABS
50.0000 mg | ORAL_TABLET | Freq: Every day | ORAL | Status: DC
  Administered 2022-05-21 – 2022-05-24 (×4): 50 mg via ORAL
  Filled 2022-05-21 (×4): qty 1

## 2022-05-21 MED ORDER — HYDROXYZINE PAMOATE 50 MG PO CAPS: 50.0000 mg | ORAL_CAPSULE | Freq: Three times a day (TID) | ORAL | Status: DC | PRN

## 2022-05-21 MED ORDER — CARVEDILOL 3.125 MG PO TABS
6.2500 mg | ORAL_TABLET | Freq: Two times a day (BID) | ORAL | Status: DC
  Administered 2022-05-22 – 2022-05-25 (×7): 6.25 mg via ORAL
  Filled 2022-05-21 (×7): qty 2

## 2022-05-21 MED ORDER — TRAZODONE HCL 50 MG PO TABS
50.0000 mg | ORAL_TABLET | Freq: Every day | ORAL | Status: DC
  Administered 2022-05-21 – 2022-05-24 (×4): 50 mg via ORAL
  Filled 2022-05-21 (×4): qty 1

## 2022-05-21 MED ORDER — GABAPENTIN 100 MG PO CAPS
100.0000 mg | ORAL_CAPSULE | Freq: Every day | ORAL | Status: DC | PRN
  Administered 2022-05-21 – 2022-05-23 (×2): 100 mg via ORAL
  Filled 2022-05-21 (×2): qty 1

## 2022-05-21 MED ORDER — ALBUTEROL SULFATE (2.5 MG/3ML) 0.083% IN NEBU: 2.5000 mg | INHALATION_SOLUTION | Freq: Four times a day (QID) | RESPIRATORY_TRACT | Status: DC | PRN

## 2022-05-21 MED ORDER — POTASSIUM CHLORIDE CRYS ER 20 MEQ PO TBCR
40.0000 meq | EXTENDED_RELEASE_TABLET | Freq: Every day | ORAL | Status: DC
  Administered 2022-05-22: 40 meq via ORAL
  Filled 2022-05-21 (×2): qty 2

## 2022-05-21 MED ORDER — ACETAMINOPHEN 325 MG PO TABS
325.0000 mg | ORAL_TABLET | Freq: Four times a day (QID) | ORAL | Status: DC | PRN
  Administered 2022-05-23 (×2): 325 mg via ORAL
  Filled 2022-05-21 (×2): qty 1

## 2022-05-21 MED ORDER — LEVOTHYROXINE SODIUM 75 MCG PO TABS
175.0000 ug | ORAL_TABLET | Freq: Every day | ORAL | Status: DC
  Administered 2022-05-22 – 2022-05-25 (×4): 175 ug via ORAL
  Filled 2022-05-21 (×4): qty 1

## 2022-05-21 MED ORDER — HYDROXYZINE HCL 25 MG PO TABS
50.0000 mg | ORAL_TABLET | Freq: Three times a day (TID) | ORAL | Status: DC | PRN
  Administered 2022-05-22 – 2022-05-23 (×3): 50 mg via ORAL
  Filled 2022-05-21 (×3): qty 2

## 2022-05-21 MED ORDER — SPIRONOLACTONE 25 MG PO TABS
100.0000 mg | ORAL_TABLET | Freq: Every day | ORAL | Status: DC
  Administered 2022-05-22 – 2022-05-25 (×4): 100 mg via ORAL
  Filled 2022-05-21 (×4): qty 4

## 2022-05-21 MED ORDER — RIFAXIMIN 550 MG PO TABS
550.0000 mg | ORAL_TABLET | Freq: Two times a day (BID) | ORAL | Status: DC
  Administered 2022-05-21 – 2022-05-25 (×8): 550 mg via ORAL
  Filled 2022-05-21 (×8): qty 1

## 2022-05-21 MED ORDER — ENOXAPARIN SODIUM 100 MG/ML IJ SOSY
0.5000 mg/kg | PREFILLED_SYRINGE | INTRAMUSCULAR | Status: DC
  Administered 2022-05-21 – 2022-05-24 (×4): 90 mg via SUBCUTANEOUS
  Filled 2022-05-21 (×4): qty 1

## 2022-05-21 MED ORDER — PANTOPRAZOLE SODIUM 40 MG PO TBEC
40.0000 mg | DELAYED_RELEASE_TABLET | Freq: Every day | ORAL | Status: DC
  Administered 2022-05-22 – 2022-05-25 (×4): 40 mg via ORAL
  Filled 2022-05-21 (×4): qty 1

## 2022-05-21 MED ORDER — SODIUM CHLORIDE 0.9% FLUSH
3.0000 mL | Freq: Two times a day (BID) | INTRAVENOUS | Status: DC
  Administered 2022-05-21 – 2022-05-25 (×8): 3 mL via INTRAVENOUS

## 2022-05-21 MED ORDER — ONDANSETRON HCL 4 MG/2ML IJ SOLN: 4.0000 mg | Freq: Four times a day (QID) | INTRAMUSCULAR | Status: DC | PRN

## 2022-05-21 NOTE — ED Provider Notes (Signed)
[facility] Provider Note   CSN: [id number] Arrival date & time: 05/21/22  1548     History  Chief Complaint  Patient presents with   Congestive Heart Failure    Daniel Crawford is a 67 y.o. male.   Congestive Heart Failure Associated symptoms include shortness of breath.   This patient is a morbidly obese 67 year old male with a history of nonalcoholic steatohepatitis, he has struggled with chronic edema of his legs and anasarca intermittently.  He also has a known history of some pulmonary hypertension and congestive heart failure to some degree.  This patient has been admitted to the hospital every couple of months when he becomes fluid overloaded and despite seeing nephrology this week and being started on torsemide, increasing his dose of spironolactone he continues to gain weight and become more short of breath.  In fact he has gained approximately 20 pounds since last admission and several pounds since he was seen by the GI doctors 4 days ago.  At that time he had anasarca to his thighs, today it is up to his abdomen.    Home Medications Prior to Admission medications   Medication Sig Start Date End Date Taking? Authorizing Provider  acetaminophen (TYLENOL) 500 MG tablet Take 1,000 mg by mouth every 6 (six) hours as needed for moderate pain.   Yes [provider]  albuterol (VENTOLIN HFA) 108 (90 Base) MCG/ACT inhaler Inhale 2 puffs into the lungs every 6 (six) hours as needed for wheezing or shortness of breath. 05/01/22  Yes Rigoberto Noel, MD  carvedilol (COREG) 6.25 MG tablet Take 1 tablet (6.25 mg total) by mouth 2 (two) times daily with a meal. 01/25/22  Yes Emokpae, Courage, MD  furosemide (LASIX) 40 MG tablet Take 40 mg by mouth.   Yes [provider]  gabapentin (NEURONTIN) 100 MG capsule Take 100 mg by mouth daily as needed (pain). 02/20/21  Yes [provider]  hydrOXYzine (VISTARIL) 50 MG capsule Take 1  capsule (50 mg total) by mouth 3 (three) times daily as needed for anxiety or nausea. 01/25/22  Yes Emokpae, Courage, MD  levothyroxine (SYNTHROID) 175 MCG tablet Take 175 mcg by mouth daily before breakfast.   Yes [provider]  ondansetron (ZOFRAN) 8 MG tablet Take by mouth every 8 (eight) hours as needed for nausea or vomiting.   Yes [provider]  pantoprazole (PROTONIX) 40 MG tablet Take 1 tablet (40 mg total) by mouth daily. 01/25/22  Yes Emokpae, Courage, MD  potassium chloride SA (KLOR-CON M) 20 MEQ tablet Take 2 tablets (40 mEq total) by mouth daily. Patient taking differently: Take 40 mEq by mouth 2 (two) times daily. 03/05/22  Yes Johnson, Clanford L, MD  rifaximin (XIFAXAN) 550 MG TABS tablet Take 1 tablet (550 mg total) by mouth 2 (two) times daily. 01/25/22  Yes Emokpae, Courage, MD  sertraline (ZOLOFT) 50 MG tablet Take 1 tablet (50 mg total) by mouth at bedtime. 01/25/22  Yes Emokpae, Courage, MD  spironolactone (ALDACTONE) 100 MG tablet Take 1 tablet (100 mg total) by mouth daily. 03/04/22  Yes Johnson, Clanford L, MD  torsemide 40 MG TABS Take 40 mg by mouth 2 (two) times daily. Patient taking differently: Take 40 mg by mouth daily. 04/10/22  Yes Tat, Shanon Brow, MD  traZODone (DESYREL) 50 MG tablet Take 1 tablet (50 mg total) by mouth at bedtime. 01/25/22  Yes Roxan Hockey, MD      Allergies  Patient has no known allergies.    Review of Systems   Review of Systems  Constitutional:  Negative for fever.  Respiratory:  Positive for shortness of breath.   Musculoskeletal:  Positive for joint swelling.  All other systems reviewed and are negative.   Physical Exam Updated Vital Signs BP (!) 141/71   Pulse 71   Temp 98.3 F (36.8 C) (Oral)   Resp 16   SpO2 100%  Physical Exam Vitals and nursing note reviewed.  Constitutional:      General: He is in acute distress.     Appearance: He is well-developed.  HENT:     Head: Normocephalic and atraumatic.      Nose: No congestion or rhinorrhea.     Mouth/Throat:     Pharynx: No oropharyngeal exudate.  Eyes:     General: No scleral icterus.       Right eye: No discharge.        Left eye: No discharge.     Conjunctiva/sclera: Conjunctivae normal.     Pupils: Pupils are equal, round, and reactive to light.  Neck:     Thyroid: No thyromegaly.     Vascular: No JVD.  Cardiovascular:     Rate and Rhythm: Normal rate and regular rhythm.     Heart sounds: Normal heart sounds. No murmur heard.    No friction rub. No gallop.  Pulmonary:     Effort: Pulmonary effort is normal. No respiratory distress.     Breath sounds: Normal breath sounds. No wheezing or rales.     Comments: tachypnea Abdominal:     General: Bowel sounds are normal. There is no distension.     Palpations: Abdomen is soft. There is no mass.     Tenderness: There is no abdominal tenderness.  Musculoskeletal:        General: No tenderness. Normal range of motion.     Cervical back: Normal range of motion and neck supple.     Right lower leg: Edema present.     Left lower leg: Edema present.  Lymphadenopathy:     Cervical: No cervical adenopathy.  Skin:    General: Skin is warm and dry.     Findings: No erythema or rash.  Neurological:     Mental Status: He is alert.     Coordination: Coordination normal.  Psychiatric:        Behavior: Behavior normal.     ED Results / Procedures / Treatments   Labs (all labs ordered are listed, but only abnormal results are displayed) Labs Reviewed  BASIC METABOLIC PANEL - Abnormal; Notable for the following components:      Result Value   Glucose, Bld 166 (*)    Creatinine, Ser 1.39 (*)    Calcium 8.2 (*)    GFR, Estimated 57 (*)    All other components within normal limits  CBC - Abnormal; Notable for the following components:   WBC 3.3 (*)    RBC 3.70 (*)    Hemoglobin 10.9 (*)    HCT 33.5 (*)    RDW 16.0 (*)    Platelets 60 (*)    All other components within normal  limits  HEPATIC FUNCTION PANEL - Abnormal; Notable for the following components:   Total Protein 6.4 (*)    Albumin 2.8 (*)    AST 44 (*)    Total Bilirubin 3.5 (*)    Bilirubin, Direct 0.9 (*)    Indirect Bilirubin 2.6 (*)  All other components within normal limits  HIV ANTIBODY (ROUTINE TESTING W REFLEX)  COMPREHENSIVE METABOLIC PANEL  CBC  MAGNESIUM    EKG EKG Interpretation  Date/Time:  Monday May 21 2022 16:49:57 EST Ventricular Rate:  71 PR Interval:    QRS Duration: 92 QT Interval:  442 QTC Calculation: 480 R Axis:   23 Text Interpretation: Accelerated Junctional rhythm Cannot rule out Anterior infarct (cited on or before 06-Apr-2022) Abnormal ECG When compared with ECG of 06-Apr-2022 15:48, Junctional rhythm has replaced Sinus rhythm QRS axis Shifted left Confirmed by Noemi Chapel [phone]) on 05/21/2022 5:24:46 PM  Radiology No results found.  Procedures Procedures    Medications Ordered in ED Medications  enoxaparin (LOVENOX) injection 40 mg (has no administration in time range)  sodium chloride flush (NS) 0.9 % injection 3 mL (has no administration in time range)  acetaminophen (TYLENOL) tablet 325 mg (has no administration in time range)  ondansetron (ZOFRAN) tablet 4 mg (has no administration in time range)    Or  ondansetron (ZOFRAN) injection 4 mg (has no administration in time range)  furosemide (LASIX) injection 40 mg (has no administration in time range)  rifaximin (XIFAXAN) tablet 550 mg (has no administration in time range)  carvedilol (COREG) tablet 6.25 mg (has no administration in time range)  spironolactone (ALDACTONE) tablet 100 mg (has no administration in time range)  sertraline (ZOLOFT) tablet 50 mg (has no administration in time range)  traZODone (DESYREL) tablet 50 mg (has no administration in time range)  levothyroxine (SYNTHROID) tablet 175 mcg (has no administration in time range)  pantoprazole (PROTONIX) EC tablet 40 mg (has no  administration in time range)  gabapentin (NEURONTIN) capsule 100 mg (has no administration in time range)  potassium chloride SA (KLOR-CON M) CR tablet 40 mEq (has no administration in time range)  albuterol (VENTOLIN HFA) 108 (90 Base) MCG/ACT inhaler 2 puff (has no administration in time range)  hydrOXYzine (VISTARIL) capsule 50 mg (has no administration in time range)  furosemide (LASIX) injection 80 mg (80 mg Intravenous Given 05/21/22 1837)    ED Course/ Medical Decision Making/ A&P                             Medical Decision Making Amount and/or Complexity of Data Reviewed Labs: ordered. Radiology: ordered.  Risk Prescription drug management. Decision regarding hospitalization.   This patient presents to the ED for concern of increasing shortness of breath and fluid retention, this involves an extensive number of treatment options, and is a complaint that carries with it a high risk of complications and morbidity.  The differential diagnosis includes worsening fluid retention from anasarca, liver failure, heart failure, renal failure, states he is following a strict salt diet and taking his medications as prescribed   Co morbidities that complicate the patient evaluation  Liver failure Heart failure [name]   Additional history obtained:  Additional history obtained from electronic medical record External records from outside source obtained and reviewed including most recent hospitalization and treatment of fluid overload   Lab Tests:  I Ordered, and personally interpreted labs.  The pertinent results include: Metabolic panel glucose [id number], creatinine of 1.3, both baseline, CBC with mild anemia, thrombocytopenia which is chronic, liver function with decreased albumin and elevated bilirubin but consistent with prior   Imaging Studies ordered:  I ordered imaging studies including 2 view chest x-ray I independently visualized and interpreted imaging which showed no acute  endings  I agree with the radiologist interpretation   Cardiac Monitoring: / EKG:  The patient was maintained on a cardiac monitor.  I personally viewed and interpreted the cardiac monitored which showed an underlying rhythm of: Normal sinus rhythm   Consultations Obtained:  I requested consultation with the hospitalist,  and discussed lab and imaging findings as well as pertinent plan - they recommend: Admission to the hospital for diuresis   Problem List / ED Course / Critical interventions / Medication management  Lasix given, patient will be admitted I have reviewed the patients home medicines and have made adjustments as needed   Social Determinants of Health:  Chronic liver failure   Test / Admission - Considered:  Will admit to hospital         Final Clinical Impression(s) / ED Diagnoses Final diagnoses:  Anasarca    Rx / DC Orders ED Discharge Orders     None         Noemi Chapel, MD 05/21/22 2053

## 2022-05-21 NOTE — H&P (Signed)
History and Physical    Daniel Crawford [id number] DOB: 10/27/1955 DOA: 05/21/2022  PCP: Gwenlyn Saran [place]   Patient coming from: Home   Chief Complaint: Swelling, wt gain, SOB   HPI: Daniel Crawford is a 67 y.o. male with medical history significant for hypertension, asthma, CKD, chronic pancytopenia, and NASH cirrhosis who presents emergency department with increased swelling, weight gain, and shortness of breath.  Patient has been following with nephrology and gastroenterology, recently had furosemide changed to torsemide, and reports adherence with his diuretic regimen, but has continued to experience increased swelling with weight gain, and now dyspnea with minimal exertion.  He denies chest pain, abdominal pain, fever, or chills.  ED Course: Upon arrival to the ED, patient is found to be afebrile and saturating well on room air with normal heart rate and stable blood pressure.  EKG demonstrates accelerated junctional rhythm and chest x-ray is negative for acute pulmonary disease.  Labs are most notable for creatinine 1.39, albumin 2.8, bilirubin 3.5, WBC 3300, hemoglobin 10.9, and platelets 60,000.  Patient was given 80 mg IV Lasix in the ED.  Review of Systems:  All other systems reviewed and apart from HPI, are negative.  Past Medical History:  Diagnosis Date   Anemia    Anxiety    Asthma    CHF (congestive heart failure) ([place])    CKD (chronic kidney disease)    Depression    Diabetes mellitus without complication ([place])    Dyspnea    Hypertension    Hypothyroidism    Liver cirrhosis secondary to NASH ([place])    NASH (nonalcoholic steatohepatitis)    Pre-diabetes    Spleen enlarged    Thrombocytopenia ([place])     Past Surgical History:  Procedure Laterality Date   Bilateral hernia surgery     2006, 2007   CHOLECYSTECTOMY     2016    COLONOSCOPY WITH PROPOFOL N/A 06/28/2021   Procedure: COLONOSCOPY WITH PROPOFOL;  Surgeon: Rogene Houston, MD;  [facility]: [facility];  Service: Endoscopy;  Laterality: N/A;  1020   ESOPHAGEAL DILATION N/A 11/06/2019   Procedure: ESOPHAGEAL DILATION;  Surgeon: Harvel Quale, MD;  Location: [facility];  Service: Gastroenterology;  Laterality: N/A;   ESOPHAGOGASTRODUODENOSCOPY (EGD) WITH PROPOFOL N/A 11/06/2019   Procedure: ESOPHAGOGASTRODUODENOSCOPY (EGD) WITH PROPOFOL;  Surgeon: Harvel Quale, MD;  Location: [facility];  Service: Gastroenterology;  Laterality: N/A;  1045   IR RADIOLOGIST EVAL & MGMT  02/19/2022   LAPAROSCOPIC ASSISTED VENTRAL HERNIA REPAIR     Polyp removed     in January of 2018.    POLYPECTOMY  06/28/2021   Procedure: POLYPECTOMY INTESTINAL;  Surgeon: Rogene Houston, MD;  Location: [facility];  Service: Endoscopy;;   RIGHT HEART CATH N/A 01/08/2022   Procedure: RIGHT HEART CATH;  Surgeon: Jettie Booze, MD;  Location: [facility];  Service: Cardiovascular;  Laterality: N/A;    Social History:   reports that he quit smoking about 7 years ago. His smoking use included cigarettes. He has a 20.00 pack-year smoking history. He has never used smokeless tobacco. He reports that he does not drink alcohol and does not use drugs.  No Known Allergies  Family History  Problem Relation Age of Onset   Liver cancer Mother    Heart disease Mother    Aneurysm Sister      Prior to Admission medications   Medication Sig Start Date End Date Taking? Authorizing Provider  acetaminophen (TYLENOL) 500 MG tablet Take 1,000 mg by mouth every 6 (six) hours as needed for moderate pain.   Yes [provider]  albuterol (VENTOLIN HFA) 108 (90 Base) MCG/ACT inhaler Inhale 2 puffs into the lungs every 6 (six) hours as needed for wheezing or shortness of breath. 05/01/22  Yes Rigoberto Noel, MD  carvedilol (COREG) 6.25 MG tablet Take 1 tablet (6.25 mg total) by mouth 2 (two) times daily with a meal. 01/25/22  Yes Emokpae, Courage, MD  furosemide (LASIX) 40 MG tablet  Take 40 mg by mouth.   Yes [provider]  gabapentin (NEURONTIN) 100 MG capsule Take 100 mg by mouth daily as needed (pain). 02/20/21  Yes [provider]  hydrOXYzine (VISTARIL) 50 MG capsule Take 1 capsule (50 mg total) by mouth 3 (three) times daily as needed for anxiety or nausea. 01/25/22  Yes Emokpae, Courage, MD  levothyroxine (SYNTHROID) 175 MCG tablet Take 175 mcg by mouth daily before breakfast.   Yes [provider]  ondansetron (ZOFRAN) 8 MG tablet Take by mouth every 8 (eight) hours as needed for nausea or vomiting.   Yes [provider]  pantoprazole (PROTONIX) 40 MG tablet Take 1 tablet (40 mg total) by mouth daily. 01/25/22  Yes Emokpae, Courage, MD  potassium chloride SA (KLOR-CON M) 20 MEQ tablet Take 2 tablets (40 mEq total) by mouth daily. Patient taking differently: Take 40 mEq by mouth 2 (two) times daily. 03/05/22  Yes Johnson, Clanford L, MD  rifaximin (XIFAXAN) 550 MG TABS tablet Take 1 tablet (550 mg total) by mouth 2 (two) times daily. 01/25/22  Yes Emokpae, Courage, MD  sertraline (ZOLOFT) 50 MG tablet Take 1 tablet (50 mg total) by mouth at bedtime. 01/25/22  Yes Emokpae, Courage, MD  spironolactone (ALDACTONE) 100 MG tablet Take 1 tablet (100 mg total) by mouth daily. 03/04/22  Yes Johnson, Clanford L, MD  torsemide 40 MG TABS Take 40 mg by mouth 2 (two) times daily. Patient taking differently: Take 40 mg by mouth daily. 04/10/22  Yes Tat, Shanon Brow, MD  traZODone (DESYREL) 50 MG tablet Take 1 tablet (50 mg total) by mouth at bedtime. 01/25/22  Yes Roxan Hockey, MD    Physical Exam: Vitals:   05/21/22 1734 05/21/22 1900 05/21/22 1930 05/21/22 2000  BP:  128/71 124/66 (!) 141/71  Pulse:  61 73 71  Resp:  '14 19 16  '$ Temp: 98.3 F (36.8 C)     TempSrc: Oral     SpO2:  100% 96% 100%     Constitutional: NAD, no pallor or diaphoresis   Eyes: PERTLA, lids and conjunctivae normal ENMT: Mucous membranes are moist. Posterior pharynx  clear of any exudate or lesions.   Neck: supple, no masses  Respiratory: no wheezing, no crackles. No accessory muscle use.  Cardiovascular: S1 & S2 heard, regular rate and rhythm. B/l LE edema.   Abdomen: Distended, soft, no rebound pain or guarding. Bowel sounds active.  Musculoskeletal: no clubbing / cyanosis. No joint deformity upper and lower extremities.   Skin: no significant rashes, lesions, ulcers. Warm, dry, well-perfused. Neurologic: CN 2-12 grossly intact. Moving all extremities. Alert and oriented.  Psychiatric: Calm. Cooperative.    Labs and Imaging on Admission: I have personally reviewed following labs and imaging studies  CBC: Recent Labs  Lab 05/17/22 1307 05/21/22 1658  WBC 3.6* 3.3*  NEUTROABS 2,203  --   HGB 11.5* 10.9*  HCT 33.9* 33.5*  MCV 87.6 90.5  PLT 59* 60*  Basic Metabolic Panel: Recent Labs  Lab 05/17/22 1307 05/21/22 1658  NA 140 139  K 4.2 3.9  CL 102 100  CO2 32 30  GLUCOSE 120* 166*  BUN 18 19  CREATININE 1.40* 1.39*  CALCIUM 8.9 8.2*   GFR: Estimated Creatinine Clearance: 92.6 mL/min (A) (by C-G formula based on SCr of 1.39 mg/dL (H)). Liver Function Tests: Recent Labs  Lab 05/17/22 1307 05/21/22 1658  AST 38* 44*  ALT 16 21  ALKPHOS  --  97  BILITOT 3.3* 3.5*  PROT 6.5 6.4*  ALBUMIN  --  2.8*   No results for input(s): "LIPASE", "AMYLASE" in the last 168 hours. No results for input(s): "AMMONIA" in the last 168 hours. Coagulation Profile: Recent Labs  Lab 05/17/22 1307  INR 1.3*   Cardiac Enzymes: No results for input(s): "CKTOTAL", "CKMB", "CKMBINDEX", "TROPONINI" in the last 168 hours. BNP (last 3 results) No results for input(s): "PROBNP" in the last 8760 hours. HbA1C: No results for input(s): "HGBA1C" in the last 72 hours. CBG: No results for input(s): "GLUCAP" in the last 168 hours. Lipid Profile: No results for input(s): "CHOL", "HDL", "LDLCALC", "TRIG", "CHOLHDL", "LDLDIRECT" in the last 72  hours. Thyroid Function Tests: No results for input(s): "TSH", "T4TOTAL", "FREET4", "T3FREE", "THYROIDAB" in the last 72 hours. Anemia Panel: No results for input(s): "VITAMINB12", "FOLATE", "FERRITIN", "TIBC", "IRON", "RETICCTPCT" in the last 72 hours. Urine analysis:    Component Value Date/Time   COLORURINE STRAW (A) 02/28/2022 0840   APPEARANCEUR CLEAR 02/28/2022 0840   LABSPEC 1.005 02/28/2022 0840   PHURINE 6.0 02/28/2022 0840   GLUCOSEU NEGATIVE 02/28/2022 0840   HGBUR SMALL (A) 02/28/2022 0840   BILIRUBINUR NEGATIVE 02/28/2022 0840   KETONESUR NEGATIVE 02/28/2022 0840   PROTEINUR NEGATIVE 02/28/2022 0840   NITRITE NEGATIVE 02/28/2022 0840   LEUKOCYTESUR NEGATIVE 02/28/2022 0840   Sepsis Labs: '@LABRCNTIP'$ (procalcitonin:4,lacticidven:4) )No results found for this or any previous visit (from the past 240 hour(s)).   Radiological Exams on Admission: No results found.  EKG: Independently reviewed. Accelerated junctional rhythm.   Assessment/Plan   1. Decompensated cirrhosis with anasarca  - Presents with progressive edema and wt gain despite reported adherence with diuretics and low-salt diet  - Not TIPS or transplant candidate per GI notes  - Given 80 mg IV Lasix in ED and is diuresing well  - Continue diuresis with 40 mg IV Lasix q12h and Aldactone 100 mg qD, continue Coreg, continue Xifaxan, check INR, monitor weight and I/Os, monitor renal function and electrolytes    2. Pancytopenia  - Chronic, appears stable with no clinical bleeding, likely d/t chronic liver disease and splenomegaly    3. Hypothyroidism  - Continue Synthroid    4. Depression, anxiety  - Continue Zoloft, trazodone, and as-needed hydroxyzine    DVT prophylaxis: Lovenox  Code Status: Full  Level of Care: Level of care: Telemetry Family Communication: none present  Disposition Plan:  Patient is from: home  Anticipated d/c is to: Home  Anticipated d/c date is: 05/25/22  Patient currently:  Pending improved volume status, stable renal function  Consults called: none  Admission status: Inpatient     Vianne Bulls, MD [facility]  05/21/2022, 8:52 PM

## 2022-05-21 NOTE — ED Notes (Signed)
Pt can stand and use urinal.

## 2022-05-21 NOTE — ED Triage Notes (Signed)
Pt here for weight gain.  Pt states that he is taking Lasix '40mg'$  and '100mg'$  spirololactone daily and is taking it as ordered.  Pt has distended abdomen and bilateral lower extremity swelling.  No CP.  Pt has increased sob.

## 2022-05-22 DIAGNOSIS — R601 Generalized edema: Secondary | ICD-10-CM

## 2022-05-22 DIAGNOSIS — K7460 Unspecified cirrhosis of liver: Secondary | ICD-10-CM | POA: Diagnosis not present

## 2022-05-22 DIAGNOSIS — D61818 Other pancytopenia: Secondary | ICD-10-CM | POA: Diagnosis not present

## 2022-05-22 DIAGNOSIS — K7290 Hepatic failure, unspecified without coma: Secondary | ICD-10-CM | POA: Diagnosis not present

## 2022-05-22 LAB — COMPREHENSIVE METABOLIC PANEL
ALT: 20 U/L (ref 0–44)
AST: 44 U/L — ABNORMAL HIGH (ref 15–41)
Albumin: 2.7 g/dL — ABNORMAL LOW (ref 3.5–5.0)
Alkaline Phosphatase: 90 U/L (ref 38–126)
Anion gap: 7 (ref 5–15)
BUN: 19 mg/dL (ref 8–23)
CO2: 29 mmol/L (ref 22–32)
Calcium: 8.3 mg/dL — ABNORMAL LOW (ref 8.9–10.3)
Chloride: 101 mmol/L (ref 98–111)
Creatinine, Ser: 1.33 mg/dL — ABNORMAL HIGH (ref 0.61–1.24)
GFR, Estimated: 60 mL/min — ABNORMAL LOW (ref >60)
Glucose, Bld: 109 mg/dL — ABNORMAL HIGH (ref 70–99)
Potassium: 3.3 mmol/L — ABNORMAL LOW (ref 3.5–5.1)
Sodium: 137 mmol/L (ref 135–145)
Total Bilirubin: 3 mg/dL — ABNORMAL HIGH (ref 0.3–1.2)
Total Protein: 6.2 g/dL — ABNORMAL LOW (ref 6.5–8.1)

## 2022-05-22 LAB — CBC
HCT: 31.9 % — ABNORMAL LOW (ref 39.0–52.0)
Hemoglobin: 10.6 g/dL — ABNORMAL LOW (ref 13.0–17.0)
MCH: 29.6 pg (ref 26.0–34.0)
MCHC: 33.2 g/dL (ref 30.0–36.0)
MCV: 89.1 fL (ref 80.0–100.0)
Platelets: 57 10*3/uL — ABNORMAL LOW (ref 150–400)
RBC: 3.58 MIL/uL — ABNORMAL LOW (ref 4.22–5.81)
RDW: 15.6 % — ABNORMAL HIGH (ref 11.5–15.5)
WBC: 3.1 10*3/uL — ABNORMAL LOW (ref 4.0–10.5)
nRBC: 0 % (ref 0.0–0.2)

## 2022-05-22 LAB — PROTIME-INR
INR: 1.5 — ABNORMAL HIGH (ref 0.8–1.2)
Prothrombin Time: 18.2 seconds — ABNORMAL HIGH (ref 11.4–15.2)

## 2022-05-22 LAB — MAGNESIUM: Magnesium: 2.1 mg/dL (ref 1.7–2.4)

## 2022-05-22 LAB — HIV ANTIBODY (ROUTINE TESTING W REFLEX): HIV Screen 4th Generation wRfx: NONREACTIVE

## 2022-05-22 MED ORDER — OXYCODONE HCL 5 MG PO TABS
5.0000 mg | ORAL_TABLET | Freq: Once | ORAL | Status: AC | PRN
  Administered 2022-05-22: 5 mg via ORAL
  Filled 2022-05-22: qty 1

## 2022-05-22 NOTE — Plan of Care (Signed)
  Problem: Education: Goal: Knowledge of General Education information will improve Description: Including pain rating scale, medication(s)/side effects and non-pharmacologic comfort measures 05/22/2022 1207 by Emmaline Life, RN Outcome: Progressing 05/22/2022 1155 by Emmaline Life, RN Outcome: Progressing 05/22/2022 1155 by Emmaline Life, RN Outcome: Progressing

## 2022-05-22 NOTE — TOC Initial Note (Addendum)
Transition of Care [facility]) - Initial/Assessment Note    Patient Details  Name: Daniel Crawford MRN: [id number] Date of Birth: 01/24/1956  Transition of Care [facility] CM/SW Contact:    Iona Beard, [place] Phone Number: 05/22/2022, 10:19 AM  Clinical Narrative:                 Pt is high risk for readmission. Pt is well known to TOC. Pt arrives from home where he lives alone. Pt is independent in completing ADLs and able to provide his own transportation when needed. Pt was recently referred to [place] services, last seen by palliative on 2/14. TOC to follow for D/C needs.   Expected Discharge Plan: Home/Self Care Barriers to Discharge: Continued Medical Work up   Patient Goals and CMS Choice Patient states their goals for this hospitalization and ongoing recovery are:: return home CMS Medicare.gov Compare Post Acute Care list provided to:: Patient Choice offered to / list presented to : Patient      Expected Discharge Plan and Services In-house Referral: Clinical Social Work Discharge Planning Services: CM Consult   Living arrangements for the past 2 months: Apartment                                      Prior Living Arrangements/Services Living arrangements for the past 2 months: Apartment Lives with:: Self Patient language and need for interpreter reviewed:: Yes Do you feel safe going back to the place where you live?: Yes      Need for Family Participation in Patient Care: Yes (Comment) Care giver support system in place?: Yes (comment)   Criminal Activity/Legal Involvement Pertinent to Current Situation/Hospitalization: No - Comment as needed  Activities of Daily Living Home Assistive Devices/Equipment: Shower chair with back ADL Screening (condition at time of admission) Patient's cognitive ability adequate to safely complete daily activities?: Yes Is the patient deaf or have difficulty hearing?: No Does the patient have difficulty seeing, even when  wearing glasses/contacts?: No Does the patient have difficulty concentrating, remembering, or making decisions?: No Patient able to express need for assistance with ADLs?: No Does the patient have difficulty dressing or bathing?: No Independently performs ADLs?: Yes (appropriate for developmental age) Does the patient have difficulty walking or climbing stairs?: Yes Weakness of Legs: Both Weakness of Arms/Hands: None  Permission Sought/Granted                  Emotional Assessment Appearance:: Appears stated age Attitude/Demeanor/Rapport: Engaged Affect (typically observed): Accepting Orientation: : Oriented to Self, Oriented to Place, Oriented to  Time, Oriented to Situation Alcohol / Substance Use: Not Applicable Psych Involvement: No (comment)  Admission diagnosis:  Anasarca [R60.1] Decompensation of cirrhosis of liver ([place]) [K72.90, K74.60] Patient Active Problem List   Diagnosis Date Noted   Decompensation of cirrhosis of liver ([place]) 05/21/2022   Pancytopenia ([place]) 05/21/2022   Acute exacerbation of CHF (congestive heart failure) ([place]) 04/06/2022   Liver cirrhosis secondary to NASH ([place]) 03/13/2022   Iron deficiency anemia 02/28/2022   Hypoalbuminemia due to protein-calorie malnutrition ([place]) 02/28/2022   GERD (gastroesophageal reflux disease) 02/28/2022   Abdominal pain 02/28/2022   Anasarca 01/23/2022   Generalized weakness 01/23/2022   Essential hypertension 01/23/2022   Volume overload 01/23/2022   Hypervolemia    Elevated TSH 01/22/2022   D-dimer, elevated 01/22/2022   Protein calorie malnutrition ([place]) 01/22/2022   Chronic diastolic heart failure ([place])  Hypogonadism, male 12/26/2021   History of colonic polyps 07/27/2021   Visual hallucination 07/27/2021   Auditory hallucinations 07/27/2021   Leukopenia 04/19/2021   Hypokalemia 04/18/2021   Dyspnea on exertion 01/20/2020   OSA (obstructive sleep apnea) 01/20/2020   History of ascites 10/22/2019    Acquired hypothyroidism 10/16/2016   Cirrhosis of liver with ascites ([place]) 10/16/2016   Depression with anxiety 10/16/2016   Thrombocytopenia ([place]) 10/16/2016   Spleen enlarged 10/16/2016   PCP:  [facility] [place]:   [id number] [place] [id number]: [phone]: [phone]    Social Determinants of Health (SDOH) Social History: SDOH Screenings   Food Insecurity: No Food Insecurity (05/21/2022)  Housing: Low Risk  (05/21/2022)  Transportation Needs: No Transportation Needs (05/21/2022)  Utilities: Not At Risk (05/21/2022)  Depression (PHQ2-9): Medium Risk (10/27/2019)  Tobacco Use: Medium Risk (05/21/2022)   SDOH Interventions:     Readmission Risk Interventions    05/22/2022   10:18 AM  Readmission Risk Prevention Plan  Transportation Screening Complete  Medication Review (RN Care Manager) Complete  HRI or Home Care Consult Complete  SW Recovery Care/Counseling Consult Complete  Palliative Care Screening Not AMalvernNot Applicable

## 2022-05-22 NOTE — Plan of Care (Signed)
  Problem: Education: Goal: Knowledge of General Education information will improve Description: Including pain rating scale, medication(s)/side effects and non-pharmacologic comfort measures 05/22/2022 1155 by Emmaline Life, RN Outcome: Progressing 05/22/2022 1155 by Emmaline Life, RN Outcome: Progressing   Problem: Health Behavior/Discharge Planning: Goal: Ability to manage health-related needs will improve 05/22/2022 1155 by Emmaline Life, RN Outcome: Progressing 05/22/2022 1155 by Emmaline Life, RN Outcome: Progressing

## 2022-05-22 NOTE — Progress Notes (Signed)
During rounds, patient would like to keep bed alarm off- rescored patients fall risk screening- Moderate fall risk. Patient educated should keep the bed alarm on however he feels safe standing at bedside to use the urinal as he has frequent needs with diuresis and will call if needs to ambulate further in the room. Alert & Oriented x4.

## 2022-05-22 NOTE — Progress Notes (Signed)
PROGRESS NOTE  Daniel Crawford MRN:[id number] DOB: [birth year] DOA: 05/21/2022 PCP: Gwenlyn Saran [place]  Brief History:  67 year old male with a history of diabetes mellitus type 2, hypertension, hypothyroidism, NASH liver cirrhosis presenting with 1 to 2-week history of worsening peripheral edema, weight gain, and dyspnea on exertion. Notably, the patient has had multiple hospitalizations secondary to decompensated liver cirrhosis with anasarca.  He was most recently admitted from 04/06/2022 to 04/10/2022 when he was treated with furosemide 80 mg IV twice daily.  His discharge weight was 173 pounds.  He was discharged home with instructions to take torsemide 40 mg twice daily and spironolactone 100 mg daily. The patient has had similar hospitalizations for decompensated liver cirrhosis from 02/27/2022 to 03/04/2022, and once again from 01/22/2022 to 01/25/2022.  Interestingly, the patient states that his insurance company would not pay for the torsemide, requiring prior authorization.  He states that this was the case at the time of his last hospital discharge, and he went back to taking his usual furosemide 40 mg twice daily.  He states that he did not start taking torsemide until about 1 week prior to this admission when he went to see nephrology.  Patient stated that authorization was obtained through his insurance company at that time for him to take torsemide.   He denies any fevers, chills, chest pain, cough, hemoptysis, nausea, vomiting, diarrhea.  He states that his abdomen feels tight.  There is no hematochezia or melena. In the ED, the patient was afebrile and hemodynamically stable with oxygen saturation 96% on room air. BMP showed sodium 139, potassium 3.9, bicarbonate 30, serum creatinine 1.39.  AST 44, ALT 21, alk phosphatase 97, total bilirubin 3.5.  The patient was started on IV furosemide 40 mg IV twice daily.   Assessment/Plan:  Decompensated liver cirrhosis with  anasarca -continue IV lasix IV 40 mg bid -many inconsistencies regarding how he takes furosemide at home -previous discharges show lasix 60 po bid, but pt taking only 40 mg bid -04/10/12 pt discharged on torsemide 40 mg BID, but states insurance did not pay for it (I did not get any request for prior auth) -also suspect a degree of dietary/fluid indiscretion contributing to his frequent decompensations -Low-sodium diet -04/10/22 discharge weight 382 -05/17/22 office weight 400 lbs -Daily weights -Patient is not felt to be a TIPS or transplant candidate secondary to comorbidities -continue rifaximin -previously evaluated by [facility] for liver transplant, though not a candidate due to his BMI, multi-morbidities.     Hypokalemia -repleted -check mag--2.1 -on daily KCl 40 mEq   Pancytopenia -due to liver cirrhosis -monitor for signs of bleeding   Diabetes mellitus type 2 -not on any agents at home -check A1C--5.1   Essential hypertension Holding coreg temporarily to allow BP margin for diuresis>>restart after d/c   Hypothyroidism Continue Synthroid   Depression/anxiety Continue Zoloft/hydroxyzine/trazodone   CKD 3a -baseline creatinine 1.1-1.4 -monitor with diuresis         Subjective: Patient denies fevers, chills, headache, chest pain, dyspnea, nausea, vomiting, diarrhea, abdominal pain, dysuria, hematuria, hematochezia, and melena.  Objective: Vitals:   05/21/22 2100 05/21/22 2222 05/22/22 0129 05/22/22 0552  BP: (!) 142/68 (!) 152/70 (!) 133/54 (!) 123/57  Pulse: 71 72 78 83  Resp: '17 18 20 18  '$ Temp:  97.9 F (36.6 C) (!) 97.3 F (36.3 C) 98 F (36.7 C)  TempSrc:      SpO2: 97% 99% 96% 96%  Weight:  Marland Kitchen)  181 kg  (!) 179.5 kg    Intake/Output Summary (Last 24 hours) at 05/22/2022 0902 Last data filed at 05/22/2022 0810 Gross per 24 hour  Intake 237 ml  Output 4050 ml  Net -3813 ml   Weight change:  Exam:  General:  Pt is alert, follows commands  appropriately, not in acute distress HEENT: No icterus, No thrush, No neck mass, [place]/AT Cardiovascular: RRR, S1/S2, no rubs, no gallops Respiratory: CTA bilaterally, no wheezing, no crackles, no rhonchi Abdomen: Soft/+BS, non tender, non distended, no guarding+ anasarca Extremities: 2 +LE  edema, No lymphangitis, No petechiae, No rashes, no synovitis   Data Reviewed: I have personally reviewed following labs and imaging studies Basic Metabolic Panel: Recent Labs  Lab 05/17/22 1307 05/21/22 1658 05/22/22 0415  NA 140 139 137  K 4.2 3.9 3.3*  CL 102 100 101  CO2 32 30 29  GLUCOSE 120* 166* 109*  BUN '18 19 19  '$ CREATININE 1.40* 1.39* 1.33*  CALCIUM 8.9 8.2* 8.3*  MG  --   --  2.1   Liver Function Tests: Recent Labs  Lab 05/17/22 1307 05/21/22 1658 05/22/22 0415  AST 38* 44* 44*  ALT '16 21 20  '$ ALKPHOS  --  97 90  BILITOT 3.3* 3.5* 3.0*  PROT 6.5 6.4* 6.2*  ALBUMIN  --  2.8* 2.7*   No results for input(s): "LIPASE", "AMYLASE" in the last 168 hours. No results for input(s): "AMMONIA" in the last 168 hours. Coagulation Profile: Recent Labs  Lab 05/17/22 1307 05/22/22 0415  INR 1.3* 1.5*   CBC: Recent Labs  Lab 05/17/22 1307 05/21/22 1658 05/22/22 0415  WBC 3.6* 3.3* 3.1*  NEUTROABS 2,203  --   --   HGB 11.5* 10.9* 10.6*  HCT 33.9* 33.5* 31.9*  MCV 87.6 90.5 89.1  PLT 59* 60* 57*   Cardiac Enzymes: No results for input(s): "CKTOTAL", "CKMB", "CKMBINDEX", "TROPONINI" in the last 168 hours. BNP: Invalid input(s): "POCBNP" CBG: No results for input(s): "GLUCAP" in the last 168 hours. HbA1C: No results for input(s): "HGBA1C" in the last 72 hours. Urine analysis:    Component Value Date/Time   COLORURINE STRAW (A) 02/28/2022 0840   APPEARANCEUR CLEAR 02/28/2022 0840   LABSPEC 1.005 02/28/2022 0840   PHURINE 6.0 02/28/2022 0840   GLUCOSEU NEGATIVE 02/28/2022 0840   HGBUR SMALL (A) 02/28/2022 0840   BILIRUBINUR NEGATIVE 02/28/2022 0840   KETONESUR  NEGATIVE 02/28/2022 0840   PROTEINUR NEGATIVE 02/28/2022 0840   NITRITE NEGATIVE 02/28/2022 0840   LEUKOCYTESUR NEGATIVE 02/28/2022 0840   Sepsis Labs: '@LABRCNTIP'$ (procalcitonin:4,lacticidven:4) )No results found for this or any previous visit (from the past 240 hour(s)).   Scheduled Meds:  carvedilol  6.25 mg Oral BID WC   enoxaparin (LOVENOX) injection  0.5 mg/kg Subcutaneous Q24H   furosemide  40 mg Intravenous Q12H   levothyroxine  175 mcg Oral QAC breakfast   pantoprazole  40 mg Oral Daily   potassium chloride SA  40 mEq Oral Daily   rifaximin  550 mg Oral BID   sertraline  50 mg Oral QHS   sodium chloride flush  3 mL Intravenous Q12H   spironolactone  100 mg Oral Daily   traZODone  50 mg Oral QHS   Continuous Infusions:  Procedures/Studies: DG Chest 2 View  Result Date: 05/21/2022 CLINICAL DATA:  Edema.  Shortness of breath.  Weight gain. EXAM: CHEST - 2 VIEW COMPARISON:  04/26/2022 and older chest x-ray FINDINGS: No consolidation, pneumothorax or effusion. Normal cardiopericardial silhouette without edema.  IMPRESSION: No acute cardiopulmonary disease Electronically Signed   By: Jill Side M.D.   On: 05/21/2022 17:33   DG Chest 2 View  Result Date: 04/26/2022 CLINICAL DATA:  Shortness of breath. EXAM: CHEST - 2 VIEW COMPARISON:  April 06, 2022. FINDINGS: The heart size and mediastinal contours are within normal limits. Both lungs are clear. The visualized skeletal structures are unremarkable. IMPRESSION: No active cardiopulmonary disease. Electronically Signed   By: Marijo Conception M.D.   On: 04/26/2022 15:54    Orson Eva, DO  [facility]  If 7PM-7AM, please contact night-coverage [email or web address] Password [email or web address] 05/22/2022, 9:02 AM   LOS: 1 day

## 2022-05-22 NOTE — Progress Notes (Signed)
Pt reported pain in legs, PRN Gabapentin given. Pt stated Gabapentin was ineffective. MD notified, one time dose of Oxycodone ordered and given. Pt educated on the importance of restricting fluid intake.

## 2022-05-22 NOTE — Plan of Care (Signed)
  Problem: Education: Goal: Knowledge of General Education information will improve Description Including pain rating scale, medication(s)/side effects and non-pharmacologic comfort measures Outcome: Progressing   Problem: Health Behavior/Discharge Planning: Goal: Ability to manage health-related needs will improve Outcome: Progressing   

## 2022-05-22 NOTE — Hospital Course (Addendum)
67 year old male with a history of diabetes mellitus type 2, hypertension, hypothyroidism, NASH liver cirrhosis presenting with 1 to 2-week history of worsening peripheral edema, weight gain, and dyspnea on exertion. Notably, the patient has had multiple hospitalizations secondary to decompensated liver cirrhosis with anasarca.  He was most recently admitted from 04/06/2022 to 04/10/2022 when he was treated with furosemide 80 mg IV twice daily.  His discharge weight was 173 pounds.  He was discharged home with instructions to take torsemide 40 mg twice daily and spironolactone 100 mg daily. The patient has had similar hospitalizations for decompensated liver cirrhosis from 02/27/2022 to 03/04/2022, and once again from 01/22/2022 to 01/25/2022.  Interestingly, the patient states that his insurance company would not pay for the torsemide, requiring prior authorization.  He states that this was the case at the time of his last hospital discharge, and he went back to taking his usual furosemide 40 mg twice daily.  Patient states that there were days of the week where he only took the furosemide once daily secondary to frequent urination.  He states that he did not start taking torsemide until about 1 week prior to this admission when he went to see nephrology.  Patient stated that authorization was obtained through his insurance company at that time for him to take torsemide.   He denies any fevers, chills, chest pain, cough, hemoptysis, nausea, vomiting, diarrhea.  He states that his abdomen feels tight.  There is no hematochezia or melena. In the ED, the patient was afebrile and hemodynamically stable with oxygen saturation 96% on room air. BMP showed sodium 139, potassium 3.9, bicarbonate 30, serum creatinine 1.39.  AST 44, ALT 21, alk phosphatase 97, total bilirubin 3.5.  The patient was started on IV furosemide 40 mg IV twice daily.

## 2022-05-23 DIAGNOSIS — I10 Essential (primary) hypertension: Secondary | ICD-10-CM

## 2022-05-23 DIAGNOSIS — F418 Other specified anxiety disorders: Secondary | ICD-10-CM | POA: Diagnosis not present

## 2022-05-23 DIAGNOSIS — E039 Hypothyroidism, unspecified: Secondary | ICD-10-CM | POA: Diagnosis not present

## 2022-05-23 DIAGNOSIS — K7290 Hepatic failure, unspecified without coma: Secondary | ICD-10-CM | POA: Diagnosis not present

## 2022-05-23 DIAGNOSIS — D61818 Other pancytopenia: Secondary | ICD-10-CM | POA: Diagnosis not present

## 2022-05-23 LAB — CBC
HCT: 29.4 % — ABNORMAL LOW (ref 39.0–52.0)
Hemoglobin: 9.6 g/dL — ABNORMAL LOW (ref 13.0–17.0)
MCH: 28.9 pg (ref 26.0–34.0)
MCHC: 32.7 g/dL (ref 30.0–36.0)
MCV: 88.6 fL (ref 80.0–100.0)
Platelets: 54 10*3/uL — ABNORMAL LOW (ref 150–400)
RBC: 3.32 MIL/uL — ABNORMAL LOW (ref 4.22–5.81)
RDW: 16 % — ABNORMAL HIGH (ref 11.5–15.5)
WBC: 2.7 10*3/uL — ABNORMAL LOW (ref 4.0–10.5)
nRBC: 0 % (ref 0.0–0.2)

## 2022-05-23 LAB — COMPREHENSIVE METABOLIC PANEL
ALT: 18 U/L (ref 0–44)
AST: 42 U/L — ABNORMAL HIGH (ref 15–41)
Albumin: 2.6 g/dL — ABNORMAL LOW (ref 3.5–5.0)
Alkaline Phosphatase: 86 U/L (ref 38–126)
Anion gap: 5 (ref 5–15)
BUN: 18 mg/dL (ref 8–23)
CO2: 29 mmol/L (ref 22–32)
Calcium: 8.2 mg/dL — ABNORMAL LOW (ref 8.9–10.3)
Chloride: 101 mmol/L (ref 98–111)
Creatinine, Ser: 1.37 mg/dL — ABNORMAL HIGH (ref 0.61–1.24)
GFR, Estimated: 58 mL/min — ABNORMAL LOW (ref >60)
Glucose, Bld: 113 mg/dL — ABNORMAL HIGH (ref 70–99)
Potassium: 3.3 mmol/L — ABNORMAL LOW (ref 3.5–5.1)
Sodium: 135 mmol/L (ref 135–145)
Total Bilirubin: 3.1 mg/dL — ABNORMAL HIGH (ref 0.3–1.2)
Total Protein: 5.8 g/dL — ABNORMAL LOW (ref 6.5–8.1)

## 2022-05-23 MED ORDER — FUROSEMIDE 10 MG/ML IJ SOLN
60.0000 mg | Freq: Two times a day (BID) | INTRAMUSCULAR | Status: DC
  Administered 2022-05-23 – 2022-05-25 (×4): 60 mg via INTRAVENOUS
  Filled 2022-05-23 (×4): qty 6

## 2022-05-23 MED ORDER — POTASSIUM CHLORIDE CRYS ER 20 MEQ PO TBCR
60.0000 meq | EXTENDED_RELEASE_TABLET | Freq: Every day | ORAL | Status: DC
  Administered 2022-05-23 – 2022-05-25 (×3): 60 meq via ORAL
  Filled 2022-05-23 (×3): qty 3

## 2022-05-23 NOTE — Progress Notes (Signed)
Pt complained of pain in legs and a headache, PRN gabapentin and tylenol given. Pt reported feeling anxious, PRN Atarax given. DBP low, other vitals stable.

## 2022-05-23 NOTE — Progress Notes (Signed)
PROGRESS NOTE  Daniel Crawford MRN:[id number] DOB: 07/05/1955 DOA: 05/21/2022 PCP: Gwenlyn Saran [place]  Brief History:  67 year old male with a history of diabetes mellitus type 2, hypertension, hypothyroidism, NASH liver cirrhosis presenting with 1 to 2-week history of worsening peripheral edema, weight gain, and dyspnea on exertion. Notably, the patient has had multiple hospitalizations secondary to decompensated liver cirrhosis with anasarca.  He was most recently admitted from 04/06/2022 to 04/10/2022 when he was treated with furosemide 80 mg IV twice daily.  His discharge weight was 173 pounds.  He was discharged home with instructions to take torsemide 40 mg twice daily and spironolactone 100 mg daily. The patient has had similar hospitalizations for decompensated liver cirrhosis from 02/27/2022 to 03/04/2022, and once again from 01/22/2022 to 01/25/2022.  Interestingly, the patient states that his insurance company would not pay for the torsemide, requiring prior authorization.  He states that this was the case at the time of his last hospital discharge, and he went back to taking his usual furosemide 40 mg twice daily.  Patient states that there were days of the week where he only took the furosemide once daily secondary to frequent urination.  He states that he did not start taking torsemide until about 1 week prior to this admission when he went to see nephrology.  Patient stated that authorization was obtained through his insurance company at that time for him to take torsemide.   He denies any fevers, chills, chest pain, cough, hemoptysis, nausea, vomiting, diarrhea.  He states that his abdomen feels tight.  There is no hematochezia or melena. In the ED, the patient was afebrile and hemodynamically stable with oxygen saturation 96% on room air. BMP showed sodium 139, potassium 3.9, bicarbonate 30, serum creatinine 1.39.  AST 44, ALT 21, alk phosphatase 97, total bilirubin  3.5.  The patient was started on IV furosemide 40 mg IV twice daily.   Assessment/Plan:  Decompensated liver cirrhosis with anasarca -continue IV lasix IV 60 mg twice a day. -04/10/12 pt discharged on torsemide 40 mg BID, but states insurance did not pay for it (I did not get any request for prior auth).  After further discussion with patient he expressed that insurance has now authorize 20 mg tablets with plans to take 2 of those tablets twice a day to achieve 40 mg as previously prescribed. -also suspect a degree of dietary/fluid indiscretion contributing to his frequent decompensations -Patient has been encouraged to follow low-sodium diet -04/10/22 discharge weight 382 -05/17/22 office weight 400 lbs -Continue Daily weights -Patient is not felt to be a TIPS or transplant candidate secondary to comorbidities -continue rifaximin -previously evaluated by [facility] for liver transplant, though not a candidate due to his BMI, multi-morbidities.     Hypokalemia -Electrolytes 3.3; continue further repletion and adjusted daily supplementation. -Magnesium level within normal limits.   Pancytopenia -due to liver cirrhosis -No signs of overt bleeding appreciated -Continue to follow hemoglobin and platelet count trend.   Diabetes mellitus type 2 -not on any agents at home -checked  A1C--5.1 -Continue modified carbohydrate diet.   Essential hypertension -Holding coreg temporarily to allow BP margin for diuresis>>restart after d/c -Heart healthy/low-sodium diet discussed with patient.   Hypothyroidism -Continue Synthroid   Depression/anxiety -Continue the use of Zoloft/hydroxyzine/trazodone -Flat affect overall appreciated without signs of suicidal ideation or hallucination.   CKD 3a -baseline creatinine 1.1-1.4 -Continue monitoring renal function with diuresis   Subjective: No fever, no chest  pain, no nausea or vomiting.  Reporting good urine output.  Still with fluid overload  appreciated on examination and short winded sensation with activity.  Patient is morbidly obese at baseline.  No requiring oxygen supplementation and reports no nausea or vomiting.  Objective: Vitals:   05/22/22 2033 05/23/22 0411 05/23/22 0500 05/23/22 1307  BP: (!) 116/54 (!) 108/54  (!) 113/56  Pulse: 67 65  65  Resp: 16 18    Temp: 98 F (36.7 C) 97.9 F (36.6 C)  98.3 F (36.8 C)  TempSrc: Oral Oral  Oral  SpO2: 98% 96%  98%  Weight:   (!) 177.8 kg     Intake/Output Summary (Last 24 hours) at 05/23/2022 1801 Last data filed at 05/23/2022 1353 Gross per 24 hour  Intake 960 ml  Output 2900 ml  Net -1940 ml   Weight change: -3.235 kg Exam: General exam: Alert, awake, oriented x 3; reports feeling better and overall breathing easier.  Still with significant fluid overload on examination. Respiratory system: Good air movement bilaterally; no using accessory muscles.  Good saturation on room air. Cardiovascular system:RRR. No rubs or gallops; unable to assess JVD with body habitus. Gastrointestinal system: Abdomen is obese: Nondistended, soft and nontender. No organomegaly or masses felt. Normal bowel sounds heard. Central nervous system: Alert and oriented. No focal neurological deficits. Extremities: No cyanosis or clubbing; 2-3+ edema bilateral appreciated. Skin: No petechiae. Psychiatry: Judgement and insight appear normal. Mood & affect appropriate.   Data Reviewed: I have personally reviewed following labs and imaging studies  Basic Metabolic Panel: Recent Labs  Lab 05/17/22 1307 05/21/22 1658 05/22/22 0415 05/23/22 0415  NA 140 139 137 135  K 4.2 3.9 3.3* 3.3*  CL 102 100 101 101  CO2 32 '30 29 29  '$ GLUCOSE 120* 166* 109* 113*  BUN '18 19 19 18  '$ CREATININE 1.40* 1.39* 1.33* 1.37*  CALCIUM 8.9 8.2* 8.3* 8.2*  MG  --   --  2.1  --    Liver Function Tests: Recent Labs  Lab 05/17/22 1307 05/21/22 1658 05/22/22 0415 05/23/22 0415  AST 38* 44* 44* 42*  ALT '16  21 20 18  '$ ALKPHOS  --  97 90 86  BILITOT 3.3* 3.5* 3.0* 3.1*  PROT 6.5 6.4* 6.2* 5.8*  ALBUMIN  --  2.8* 2.7* 2.6*   Coagulation Profile: Recent Labs  Lab 05/17/22 1307 05/22/22 0415  INR 1.3* 1.5*   CBC: Recent Labs  Lab 05/17/22 1307 05/21/22 1658 05/22/22 0415 05/23/22 0415  WBC 3.6* 3.3* 3.1* 2.7*  NEUTROABS 2,203  --   --   --   HGB 11.5* 10.9* 10.6* 9.6*  HCT 33.9* 33.5* 31.9* 29.4*  MCV 87.6 90.5 89.1 88.6  PLT 59* 60* 57* 54*   Urine analysis:    Component Value Date/Time   COLORURINE STRAW (A) 02/28/2022 0840   APPEARANCEUR CLEAR 02/28/2022 0840   LABSPEC 1.005 02/28/2022 0840   PHURINE 6.0 02/28/2022 0840   GLUCOSEU NEGATIVE 02/28/2022 0840   HGBUR SMALL (A) 02/28/2022 0840   BILIRUBINUR NEGATIVE 02/28/2022 0840   KETONESUR NEGATIVE 02/28/2022 0840   PROTEINUR NEGATIVE 02/28/2022 0840   NITRITE NEGATIVE 02/28/2022 0840   LEUKOCYTESUR NEGATIVE 02/28/2022 0840   Scheduled Meds:  carvedilol  6.25 mg Oral BID WC   enoxaparin (LOVENOX) injection  0.5 mg/kg Subcutaneous Q24H   furosemide  60 mg Intravenous Q12H   levothyroxine  175 mcg Oral QAC breakfast   pantoprazole  40 mg Oral Daily  potassium chloride SA  60 mEq Oral Daily   rifaximin  550 mg Oral BID   sertraline  50 mg Oral QHS   sodium chloride flush  3 mL Intravenous Q12H   spironolactone  100 mg Oral Daily   traZODone  50 mg Oral QHS   Continuous Infusions:  Procedures/Studies: DG Chest 2 View  Result Date: 05/21/2022 CLINICAL DATA:  Edema.  Shortness of breath.  Weight gain. EXAM: CHEST - 2 VIEW COMPARISON:  04/26/2022 and older chest x-ray FINDINGS: No consolidation, pneumothorax or effusion. Normal cardiopericardial silhouette without edema. IMPRESSION: No acute cardiopulmonary disease Electronically Signed   By: Jill Side M.D.   On: 05/21/2022 17:33   DG Chest 2 View  Result Date: 04/26/2022 CLINICAL DATA:  Shortness of breath. EXAM: CHEST - 2 VIEW COMPARISON:  April 06, 2022.  FINDINGS: The heart size and mediastinal contours are within normal limits. Both lungs are clear. The visualized skeletal structures are unremarkable. IMPRESSION: No active cardiopulmonary disease. Electronically Signed   By: Marijo Conception M.D.   On: 04/26/2022 15:54    Barton Dubois, MD  [facility]  If 7PM-7AM, please contact night-coverage [email or web address] Password [email or web address] 05/23/2022, 6:01 PM   LOS: 2 days

## 2022-05-24 LAB — COMPREHENSIVE METABOLIC PANEL
ALT: 18 U/L (ref 0–44)
AST: 46 U/L — ABNORMAL HIGH (ref 15–41)
Albumin: 2.6 g/dL — ABNORMAL LOW (ref 3.5–5.0)
Alkaline Phosphatase: 86 U/L (ref 38–126)
Anion gap: 7 (ref 5–15)
BUN: 18 mg/dL (ref 8–23)
CO2: 30 mmol/L (ref 22–32)
Calcium: 8.3 mg/dL — ABNORMAL LOW (ref 8.9–10.3)
Chloride: 100 mmol/L (ref 98–111)
Creatinine, Ser: 1.36 mg/dL — ABNORMAL HIGH (ref 0.61–1.24)
GFR, Estimated: 58 mL/min — ABNORMAL LOW (ref >60)
Glucose, Bld: 112 mg/dL — ABNORMAL HIGH (ref 70–99)
Potassium: 3.5 mmol/L (ref 3.5–5.1)
Sodium: 137 mmol/L (ref 135–145)
Total Bilirubin: 3.1 mg/dL — ABNORMAL HIGH (ref 0.3–1.2)
Total Protein: 6 g/dL — ABNORMAL LOW (ref 6.5–8.1)

## 2022-05-24 LAB — CBC
HCT: 30.3 % — ABNORMAL LOW (ref 39.0–52.0)
Hemoglobin: 9.9 g/dL — ABNORMAL LOW (ref 13.0–17.0)
MCH: 29 pg (ref 26.0–34.0)
MCHC: 32.7 g/dL (ref 30.0–36.0)
MCV: 88.9 fL (ref 80.0–100.0)
Platelets: 54 10*3/uL — ABNORMAL LOW (ref 150–400)
RBC: 3.41 MIL/uL — ABNORMAL LOW (ref 4.22–5.81)
RDW: 16.1 % — ABNORMAL HIGH (ref 11.5–15.5)
WBC: 2.7 10*3/uL — ABNORMAL LOW (ref 4.0–10.5)
nRBC: 0 % (ref 0.0–0.2)

## 2022-05-24 MED ORDER — OXYCODONE HCL 5 MG PO TABS
5.0000 mg | ORAL_TABLET | Freq: Four times a day (QID) | ORAL | Status: DC | PRN
  Administered 2022-05-24: 5 mg via ORAL
  Filled 2022-05-24: qty 1

## 2022-05-24 NOTE — Care Management Important Message (Signed)
Important Message  Patient Details  Name: Jeffer Bennie MRN: [id number] Date of Birth: 1956/03/06   Medicare Important Message Given:  Yes     Tommy Medal 05/24/2022, 2:12 PM

## 2022-05-24 NOTE — Progress Notes (Signed)
PROGRESS NOTE  Daniel Crawford MRN:[id number] DOB: [birth year] DOA: 05/21/2022 PCP: Gwenlyn Saran [place]  Brief History:  67 year old male with a history of diabetes mellitus type 2, hypertension, hypothyroidism, NASH liver cirrhosis presenting with 1 to 2-week history of worsening peripheral edema, weight gain, and dyspnea on exertion. Notably, the patient has had multiple hospitalizations secondary to decompensated liver cirrhosis with anasarca.  He was most recently admitted from 04/06/2022 to 04/10/2022 when he was treated with furosemide 80 mg IV twice daily.  His discharge weight was 173 pounds.  He was discharged home with instructions to take torsemide 40 mg twice daily and spironolactone 100 mg daily. The patient has had similar hospitalizations for decompensated liver cirrhosis from 02/27/2022 to 03/04/2022, and once again from 01/22/2022 to 01/25/2022.  Interestingly, the patient states that his insurance company would not pay for the torsemide, requiring prior authorization.  He states that this was the case at the time of his last hospital discharge, and he went back to taking his usual furosemide 40 mg twice daily.  Patient states that there were days of the week where he only took the furosemide once daily secondary to frequent urination.  He states that he did not start taking torsemide until about 1 week prior to this admission when he went to see nephrology.  Patient stated that authorization was obtained through his insurance company at that time for him to take torsemide.   He denies any fevers, chills, chest pain, cough, hemoptysis, nausea, vomiting, diarrhea.  He states that his abdomen feels tight.  There is no hematochezia or melena. In the ED, the patient was afebrile and hemodynamically stable with oxygen saturation 96% on room air. BMP showed sodium 139, potassium 3.9, bicarbonate 30, serum creatinine 1.39.  AST 44, ALT 21, alk phosphatase 97, total bilirubin  3.5.  The patient was started on IV furosemide 40 mg IV twice daily.   Assessment/Plan:  Decompensated liver cirrhosis with anasarca -continue IV lasix IV 60 mg twice a day. -04/10/12 pt discharged on torsemide 40 mg BID, but states insurance did not pay for it (I did not get any request for prior auth).  After further discussion with patient he expressed that insurance has now authorize 20 mg tablets with plans to take 2 of those tablets twice a day to achieve 40 mg as previously prescribed. -also suspect a degree of dietary/fluid indiscretion contributing to his frequent decompensations -Patient has been encouraged to follow low-sodium diet -04/10/22 discharge weight 382 -05/17/22 office weight 400 lbs -Continue Daily weights -Patient is not felt to be a TIPS or transplant candidate secondary to comorbidities -continue rifaximin -previously evaluated by [facility] for liver transplant, though not a candidate due to his BMI, multi-comorbidities.     Hypokalemia -Electrolytes 3.3; continue further repletion and adjusted daily supplementation. -Magnesium level within normal limits.   Pancytopenia -due to liver cirrhosis -No signs of overt bleeding appreciated -Continue to follow hemoglobin and platelet count trend.   Diabetes mellitus type 2 -not on any agents at home -checked  A1C--5.1 -Continue modified carbohydrate diet.   Essential hypertension -Holding coreg temporarily to allow BP margin for diuresis>>restart after d/c -Heart healthy/low-sodium diet discussed with patient.   Hypothyroidism -Continue Synthroid   Depression/anxiety -Continue the use of Zoloft/hydroxyzine/trazodone -Flat affect overall appreciated without signs of suicidal ideation or hallucination.   CKD 3a -baseline creatinine 1.1-1.4 -Continue monitoring renal function with diuresis   Subjective: Breath, no chest pain,  no nausea, no vomiting.  Reports feeling less short winded and expressed good urine  output.  Objective: Vitals:   05/23/22 2123 05/24/22 0317 05/24/22 0500 05/24/22 1425  BP: (!) 112/48 (!) 106/53  117/62  Pulse: 64 63  87  Resp: 16 14    Temp: 97.7 F (36.5 C) 98 F (36.7 C)  98.2 F (36.8 C)  TempSrc: Oral Oral    SpO2: 97% 95%  95%  Weight:   (!) 175.8 kg     Intake/Output Summary (Last 24 hours) at 05/24/2022 1628 Last data filed at 05/24/2022 1055 Gross per 24 hour  Intake 1320 ml  Output 6000 ml  Net -4680 ml   Weight change: -1.965 kg  Exam: General exam: Alert, awake, oriented x 3; reports good urine output, improvement in his overall swelling and feeling less winded with activity.  No chest pain, no nausea, no vomiting. Respiratory system: Clear to auscultation. Respiratory effort normal.  Good saturation on room air. Cardiovascular system:RRR. No rubs or gallops; unable to assess JVD due to body habitus. Gastrointestinal system: Abdomen is obese, nondistended, soft and nontender. No organomegaly or masses felt. Normal bowel sounds heard.  Increased abdominal girth and edema changes in his flank areas. Central nervous system: Alert and oriented. No focal neurological deficits. Extremities: No cyanosis or clubbing; 2+ edema appreciated bilaterally. Skin: No petechiae. Psychiatry: Judgement and insight appear normal. Mood & affect appropriate.   Data Reviewed: I have personally reviewed following labs and imaging studies  Basic Metabolic Panel: Recent Labs  Lab 05/21/22 1658 05/22/22 0415 05/23/22 0415 05/24/22 0409  NA 139 137 135 137  K 3.9 3.3* 3.3* 3.5  CL 100 101 101 100  CO2 '30 29 29 30  '$ GLUCOSE 166* 109* 113* 112*  BUN '19 19 18 18  '$ CREATININE 1.39* 1.33* 1.37* 1.36*  CALCIUM 8.2* 8.3* 8.2* 8.3*  MG  --  2.1  --   --    Liver Function Tests: Recent Labs  Lab 05/21/22 1658 05/22/22 0415 05/23/22 0415 05/24/22 0409  AST 44* 44* 42* 46*  ALT '21 20 18 18  '$ ALKPHOS 97 90 86 86  BILITOT 3.5* 3.0* 3.1* 3.1*  PROT 6.4* 6.2* 5.8*  6.0*  ALBUMIN 2.8* 2.7* 2.6* 2.6*   Coagulation Profile: Recent Labs  Lab 05/22/22 0415  INR 1.5*   CBC: Recent Labs  Lab 05/21/22 1658 05/22/22 0415 05/23/22 0415 05/24/22 0409  WBC 3.3* 3.1* 2.7* 2.7*  HGB 10.9* 10.6* 9.6* 9.9*  HCT 33.5* 31.9* 29.4* 30.3*  MCV 90.5 89.1 88.6 88.9  PLT 60* 57* 54* 54*   Urine analysis:    Component Value Date/Time   COLORURINE STRAW (A) 02/28/2022 0840   APPEARANCEUR CLEAR 02/28/2022 0840   LABSPEC 1.005 02/28/2022 0840   PHURINE 6.0 02/28/2022 0840   GLUCOSEU NEGATIVE 02/28/2022 0840   HGBUR SMALL (A) 02/28/2022 0840   BILIRUBINUR NEGATIVE 02/28/2022 0840   KETONESUR NEGATIVE 02/28/2022 0840   PROTEINUR NEGATIVE 02/28/2022 0840   NITRITE NEGATIVE 02/28/2022 0840   LEUKOCYTESUR NEGATIVE 02/28/2022 0840   Scheduled Meds:  carvedilol  6.25 mg Oral BID WC   enoxaparin (LOVENOX) injection  0.5 mg/kg Subcutaneous Q24H   furosemide  60 mg Intravenous Q12H   levothyroxine  175 mcg Oral QAC breakfast   pantoprazole  40 mg Oral Daily   potassium chloride SA  60 mEq Oral Daily   rifaximin  550 mg Oral BID   sertraline  50 mg Oral QHS   sodium chloride  flush  3 mL Intravenous Q12H   spironolactone  100 mg Oral Daily   traZODone  50 mg Oral QHS   Continuous Infusions:  Procedures/Studies: DG Chest 2 View  Result Date: 05/21/2022 CLINICAL DATA:  Edema.  Shortness of breath.  Weight gain. EXAM: CHEST - 2 VIEW COMPARISON:  04/26/2022 and older chest x-ray FINDINGS: No consolidation, pneumothorax or effusion. Normal cardiopericardial silhouette without edema. IMPRESSION: No acute cardiopulmonary disease Electronically Signed   By: Jill Side M.D.   On: 05/21/2022 17:33   DG Chest 2 View  Result Date: 04/26/2022 CLINICAL DATA:  Shortness of breath. EXAM: CHEST - 2 VIEW COMPARISON:  April 06, 2022. FINDINGS: The heart size and mediastinal contours are within normal limits. Both lungs are clear. The visualized skeletal structures are  unremarkable. IMPRESSION: No active cardiopulmonary disease. Electronically Signed   By: Marijo Conception M.D.   On: 04/26/2022 15:54    Barton Dubois, MD  [facility]  If 7PM-7AM, please contact night-coverage [email or web address] Password [email or web address] 05/24/2022, 4:28 PM   LOS: 3 days

## 2022-05-24 NOTE — Progress Notes (Signed)
Pt reported 8/10 pain in back and legs. MD notified, new order for oxycodone given. PRN Atarax given for reported anxiety. 4,000 mL of urine output noted during the night.

## 2022-05-25 ENCOUNTER — Telehealth: Payer: Self-pay

## 2022-05-25 DIAGNOSIS — E66813 Obesity, class 3: Secondary | ICD-10-CM

## 2022-05-25 MED ORDER — METOLAZONE 2.5 MG PO TABS: 2.5000 mg | ORAL_TABLET | ORAL | 0 refills | Status: DC

## 2022-05-25 NOTE — Progress Notes (Signed)
Pt ambulating independently to bathroom during this writers shift. Pt also took a shower, supplies were provided. Pt anxious and eager to discharge later today. Plan of care ongoing at this time.

## 2022-05-25 NOTE — Telephone Encounter (Signed)
Received fax in regards to patient needing a sooner appt. From [facility]  Patient is admitted at [facility] but expected to D/C tomorrow 05/26/2022.  Patient last seen 09/2020  Dr. Elsworth Soho would you be okay with [place] adding this patient on for an afternoon appt next week?

## 2022-05-25 NOTE — Discharge Summary (Signed)
Physician Discharge Summary   Patient: Daniel Crawford MRN: [id number] DOB: 04/21/1955  Admit date:     05/21/2022  Discharge date: 05/25/22  Discharge Physician: Barton Dubois   PCP: [facility]   Recommendations at discharge:  Repeat basic metabolic panel to follow electrolytes and renal function Continue to further adjust patient's diuretic therapy to assist with anasarca/fluid overload. Make sure patient follow-up with gastroenterology service for further management of his underlying history of cirrhosis. Continue to assist with weight loss management.  Discharge Diagnoses: Principal Problem:   Decompensation of cirrhosis of liver (HCC) Active Problems:   Acquired hypothyroidism   Cirrhosis of liver with ascites (HCC)   Depression with anxiety   Anasarca   Essential hypertension   Pancytopenia (HCC)   Morbid obesity [facility])  Hospital Course: 67 year old male with a history of diabetes mellitus type 2, hypertension, hypothyroidism, NASH liver cirrhosis presenting with 1 to 2-week history of worsening peripheral edema, weight gain, and dyspnea on exertion. Notably, the patient has had multiple hospitalizations secondary to decompensated liver cirrhosis with anasarca.  He was most recently admitted from 04/06/2022 to 04/10/2022 when he was treated with furosemide 80 mg IV twice daily.  His discharge weight was 173 pounds.  He was discharged home with instructions to take torsemide 40 mg twice daily and spironolactone 100 mg daily. The patient has had similar hospitalizations for decompensated liver cirrhosis from 02/27/2022 to 03/04/2022, and once again from 01/22/2022 to 01/25/2022.  Interestingly, the patient states that his insurance company would not pay for the torsemide, requiring prior authorization.  He states that this was the case at the time of his last hospital discharge, and he went back to taking his usual furosemide 40 mg twice daily.  Patient states that  there were days of the week where he only took the furosemide once daily secondary to frequent urination.  He states that he did not start taking torsemide until about 1 week prior to this admission when he went to see nephrology.  Patient stated that authorization was obtained through his insurance company at that time for him to take torsemide.   He denies any fevers, chills, chest pain, cough, hemoptysis, nausea, vomiting, diarrhea.  He states that his abdomen feels tight.  There is no hematochezia or melena. In the ED, the patient was afebrile and hemodynamically stable with oxygen saturation 96% on room air. BMP showed sodium 139, potassium 3.9, bicarbonate 30, serum creatinine 1.39.  AST 44, ALT 21, alk phosphatase 97, total bilirubin 3.5.  The patient was started on IV furosemide 40 mg IV twice daily.  Assessment and Plan: Decompensated liver cirrhosis with anasarca -Resewn the use of torsemide 40 mg twice a day, spironolactone 100 mg daily and the use of metolazone weekly. -Patient has been instructed to be compliant with medications, low-sodium diet and adequate hydration. -also suspect a degree of dietary/fluid indiscretion contributing to his frequent decompensations -Discharge weight 171 Kg  -Continue Daily weights -Patient is not felt to be a TIPS or transplant candidate secondary to comorbidities -continue rifaximin and beta-blocker. -previously evaluated by [facility] for liver transplant, though not a candidate due to his BMI, multi-comorbidities.   -Continue patient follow-up with gastroenterology service.   Hypokalemia -Repleted and within normal limits at discharge -Continue daily supplementation. -Magnesium level within normal limits.   Pancytopenia -due to liver cirrhosis -No signs of overt bleeding appreciated -Continue to follow hemoglobin and platelet count trend.   Diabetes mellitus type 2 -not on any agents at  home -checked  A1C--5.1 -Continue modified carbohydrate  diet. -Continue to follow patient's CBGs/A1c trending to initiate hypoglycemic regimen as required.   Essential hypertension -Resume the use of carvedilol. -Heart healthy/low-sodium diet discussed with patient.   Hypothyroidism -Continue Synthroid -Continue to follow thyroid panel as an outpatient.   Depression/anxiety -Continue the use of Zoloft/hydroxyzine/trazodone -Flat affect overall appreciated without signs of suicidal ideation or hallucination. -Stable mood overall.   CKD 3a -baseline creatinine 1.1-1.4 -Stable and at baseline -Continue to maintain adequate hydration and follow renal function trend.  Morbid obesity -Body mass index is 48.6 kg/m. -Low-calorie diet, portion control and increase activity discussed with patient.  Consultants: None Procedures performed: See below for x-ray reports. Disposition: Home Diet recommendation: Heart healthy/low-sodium, low calorie and modified carbohydrate diet  DISCHARGE MEDICATION: Allergies as of 05/25/2022   No Known Allergies      Medication List     STOP taking these medications    furosemide 40 MG tablet Commonly known as: LASIX       TAKE these medications    acetaminophen 500 MG tablet Commonly known as: TYLENOL Take 1,000 mg by mouth every 6 (six) hours as needed for moderate pain.   albuterol 108 (90 Base) MCG/ACT inhaler Commonly known as: VENTOLIN HFA Inhale 2 puffs into the lungs every 6 (six) hours as needed for wheezing or shortness of breath.   carvedilol 6.25 MG tablet Commonly known as: COREG Take 1 tablet (6.25 mg total) by mouth 2 (two) times daily with a meal.   gabapentin 100 MG capsule Commonly known as: NEURONTIN Take 100 mg by mouth daily as needed (pain).   hydrOXYzine 50 MG capsule Commonly known as: VISTARIL Take 1 capsule (50 mg total) by mouth 3 (three) times daily as needed for anxiety or nausea.   levothyroxine 175 MCG tablet Commonly known as: SYNTHROID Take 175 mcg  by mouth daily before breakfast.   metolazone 2.5 MG tablet Commonly known as: ZAROXOLYN Take 1 tablet (2.5 mg total) by mouth once a week. Start taking on: May 30, 2022   ondansetron 8 MG tablet Commonly known as: ZOFRAN Take by mouth every 8 (eight) hours as needed for nausea or vomiting.   pantoprazole 40 MG tablet Commonly known as: PROTONIX Take 1 tablet (40 mg total) by mouth daily.   potassium chloride SA 20 MEQ tablet Commonly known as: KLOR-CON M Take 2 tablets (40 mEq total) by mouth daily. What changed: when to take this   rifaximin 550 MG Tabs tablet Commonly known as: XIFAXAN Take 1 tablet (550 mg total) by mouth 2 (two) times daily.   sertraline 50 MG tablet Commonly known as: ZOLOFT Take 1 tablet (50 mg total) by mouth at bedtime.   spironolactone 100 MG tablet Commonly known as: ALDACTONE Take 1 tablet (100 mg total) by mouth daily.   Torsemide 40 MG Tabs Take 40 mg by mouth 2 (two) times daily. What changed: when to take this   traZODone 50 MG tablet Commonly known as: DESYREL Take 1 tablet (50 mg total) by mouth at bedtime.        [place], [facility]. Schedule an appointment as soon as possible for a visit in 10 day(s).   Contact information: [address] [place] [address] [phone]                Discharge Exam: Filed Weights   05/23/22 0500 05/24/22 0500 05/25/22 0500  Weight: (!) 177.8  kg (!) 175.8 kg (!) 171.7 kg   General exam: Alert, awake and oriented x 3; reporting good urine output and denies shortness of breath, fever or chest pain.  Feeling ready to go home. Respiratory system: Clear to auscultation. Respiratory effort normal.  Good saturation on room air.  No frank crackles appreciated on examination. Cardiovascular system:RRR. No rubs or gallops; unable to assess JVD due to body habitus. Gastrointestinal system: Abdomen is obese, nondistended, soft and nontender. No  organomegaly or masses felt. Normal bowel sounds heard.  Increased abdominal girth and edema changes in his flank areas. Central nervous system: Alert and oriented. No focal neurological deficits. Extremities: No cyanosis or clubbing; 2+ edema appreciated bilaterally (according to patient close to his baseline).. Skin: No petechiae. Psychiatry: Judgement and insight appear normal. Mood & affect appropriate.   Condition at discharge: Stable and improved.  The results of significant diagnostics from this hospitalization (including imaging, microbiology, ancillary and laboratory) are listed below for reference.   Imaging Studies: DG Chest 2 View  Result Date: 05/21/2022 CLINICAL DATA:  Edema.  Shortness of breath.  Weight gain. EXAM: CHEST - 2 VIEW COMPARISON:  04/26/2022 and older chest x-ray FINDINGS: No consolidation, pneumothorax or effusion. Normal cardiopericardial silhouette without edema. IMPRESSION: No acute cardiopulmonary disease Electronically Signed   By: Jill Side M.D.   On: 05/21/2022 17:33   DG Chest 2 View  Result Date: 04/26/2022 CLINICAL DATA:  Shortness of breath. EXAM: CHEST - 2 VIEW COMPARISON:  April 06, 2022. FINDINGS: The heart size and mediastinal contours are within normal limits. Both lungs are clear. The visualized skeletal structures are unremarkable. IMPRESSION: No active cardiopulmonary disease. Electronically Signed   By: Marijo Conception M.D.   On: 04/26/2022 15:54    Microbiology: Results for orders placed or performed during the hospital encounter of 02/27/22  Resp Panel by RT-PCR (Flu A&B, Covid) Anterior Nasal Swab     Status: None   Collection Time: 02/27/22  6:48 PM   Specimen: Anterior Nasal Swab  Result Value Ref Range Status   SARS Coronavirus 2 by RT PCR NEGATIVE NEGATIVE Final    Comment: (NOTE) SARS-CoV-2 target nucleic acids are NOT DETECTED.  The SARS-CoV-2 RNA is generally detectable in upper respiratory specimens during the acute phase  of infection. The lowest concentration of SARS-CoV-2 viral copies this assay can detect is 138 copies/mL. A negative result does not preclude SARS-Cov-2 infection and should not be used as the sole basis for treatment or other patient management decisions. A negative result [place] occur with  improper specimen collection/handling, submission of specimen other than nasopharyngeal swab, presence of viral mutation(s) within the areas targeted by this assay, and inadequate number of viral copies(<138 copies/mL). A negative result must be combined with clinical observations, patient history, and epidemiological information. The expected result is Negative.  Fact Sheet for Patients:  EntrepreneurPulse.com.au  Fact Sheet for Healthcare Providers:  IncredibleEmployment.be  This test is no t yet approved or cleared by the [place] FDA and  has been authorized for detection and/or diagnosis of SARS-CoV-2 by FDA under an Emergency Use Authorization (EUA). This EUA will remain  in effect (meaning this test can be used) for the duration of the COVID-19 declaration under Section 564(b)(1) of the Act, 21 U.S.C.section 360bbb-3(b)(1), unless the authorization is terminated  or revoked sooner.       Influenza A by PCR NEGATIVE NEGATIVE Final   Influenza B by PCR NEGATIVE NEGATIVE Final    Comment: (NOTE)  The Xpert Xpress SARS-CoV-2/FLU/RSV plus assay is intended as an aid in the diagnosis of influenza from Nasopharyngeal swab specimens and should not be used as a sole basis for treatment. Nasal washings and aspirates are unacceptable for Xpert Xpress SARS-CoV-2/FLU/RSV testing.  Fact Sheet for Patients: EntrepreneurPulse.com.au  Fact Sheet for Healthcare Providers: IncredibleEmployment.be  This test is not yet approved or cleared by the [place] FDA and has been authorized for detection and/or diagnosis of  SARS-CoV-2 by FDA under an Emergency Use Authorization (EUA). This EUA will remain in effect (meaning this test can be used) for the duration of the COVID-19 declaration under Section 564(b)(1) of the Act, 21 U.S.C. section 360bbb-3(b)(1), unless the authorization is terminated or revoked.  Performed at [facility], [address] [place], [place] [address]     Labs: CBC: Recent Labs  Lab 05/21/22 1658 05/22/22 0415 05/23/22 0415 05/24/22 0409  WBC 3.3* 3.1* 2.7* 2.7*  HGB 10.9* 10.6* 9.6* 9.9*  HCT 33.5* 31.9* 29.4* 30.3*  MCV 90.5 89.1 88.6 88.9  PLT 60* 57* 54* 54*   Basic Metabolic Panel: Recent Labs  Lab 05/21/22 1658 05/22/22 0415 05/23/22 0415 05/24/22 0409  NA 139 137 135 137  K 3.9 3.3* 3.3* 3.5  CL 100 101 101 100  CO2 '30 29 29 30  '$ GLUCOSE 166* 109* 113* 112*  BUN '19 19 18 18  '$ CREATININE 1.39* 1.33* 1.37* 1.36*  CALCIUM 8.2* 8.3* 8.2* 8.3*  MG  --  2.1  --   --    Liver Function Tests: Recent Labs  Lab 05/21/22 1658 05/22/22 0415 05/23/22 0415 05/24/22 0409  AST 44* 44* 42* 46*  ALT '21 20 18 18  '$ ALKPHOS 97 90 86 86  BILITOT 3.5* 3.0* 3.1* 3.1*  PROT 6.4* 6.2* 5.8* 6.0*  ALBUMIN 2.8* 2.7* 2.6* 2.6*   CBG: No results for input(s): "GLUCAP" in the last 168 hours.  Discharge time spent: greater than 30 minutes.  Signed: Barton Dubois, MD [facility] 05/25/2022

## 2022-05-30 DIAGNOSIS — K7460 Unspecified cirrhosis of liver: Secondary | ICD-10-CM | POA: Diagnosis not present

## 2022-05-30 DIAGNOSIS — I509 Heart failure, unspecified: Secondary | ICD-10-CM | POA: Diagnosis not present

## 2022-05-30 DIAGNOSIS — E039 Hypothyroidism, unspecified: Secondary | ICD-10-CM | POA: Diagnosis not present

## 2022-05-30 DIAGNOSIS — R609 Edema, unspecified: Secondary | ICD-10-CM | POA: Diagnosis not present

## 2022-05-30 DIAGNOSIS — Z79899 Other long term (current) drug therapy: Secondary | ICD-10-CM | POA: Diagnosis not present

## 2022-06-07 DIAGNOSIS — R4182 Altered mental status, unspecified: Secondary | ICD-10-CM | POA: Diagnosis not present

## 2022-06-07 DIAGNOSIS — I509 Heart failure, unspecified: Secondary | ICD-10-CM | POA: Diagnosis not present

## 2022-06-07 DIAGNOSIS — Z79899 Other long term (current) drug therapy: Secondary | ICD-10-CM | POA: Diagnosis not present

## 2022-06-07 DIAGNOSIS — K7460 Unspecified cirrhosis of liver: Secondary | ICD-10-CM | POA: Diagnosis not present

## 2022-06-07 DIAGNOSIS — D696 Thrombocytopenia, unspecified: Secondary | ICD-10-CM | POA: Diagnosis not present

## 2022-06-07 DIAGNOSIS — K7290 Hepatic failure, unspecified without coma: Secondary | ICD-10-CM | POA: Diagnosis not present

## 2022-06-08 DIAGNOSIS — Z0100 Encounter for examination of eyes and vision without abnormal findings: Secondary | ICD-10-CM | POA: Diagnosis not present

## 2022-06-11 ENCOUNTER — Other Ambulatory Visit (INDEPENDENT_AMBULATORY_CARE_PROVIDER_SITE_OTHER): Payer: Self-pay | Admitting: *Deleted

## 2022-06-11 ENCOUNTER — Telehealth (INDEPENDENT_AMBULATORY_CARE_PROVIDER_SITE_OTHER): Payer: Self-pay | Admitting: *Deleted

## 2022-06-11 NOTE — Telephone Encounter (Signed)
Spoke with patient. He was reassured. We need to make sure to get him in for an office visit. He heard the word "tumor marker", and I believe this is were a misunderstanding came in.

## 2022-06-11 NOTE — Telephone Encounter (Signed)
Patient left voicemail that he got some disturbing news from a nurse at [facility] last week. He said the word cancer came up. I read him the result note he was concerned about -   MELD 3.0 is 18. AFP chronically elevated. However, he was inpatient since these labs completed. We need to have an appt upcoming in next 2-4 weeks. Per Vicente Males.   I let him know AFP was a test to check for liver cancer but Vicente Males did not mention cancer on the result note just that it has been chronically elevated. He wanted to discuss with Vicente Males. Reports he has been very worried all weekend that he has cancer.

## 2022-06-14 ENCOUNTER — Telehealth: Payer: Self-pay | Admitting: Gastroenterology

## 2022-06-14 DIAGNOSIS — K7581 Nonalcoholic steatohepatitis (NASH): Secondary | ICD-10-CM | POA: Diagnosis not present

## 2022-06-14 DIAGNOSIS — I5032 Chronic diastolic (congestive) heart failure: Secondary | ICD-10-CM | POA: Diagnosis not present

## 2022-06-14 DIAGNOSIS — Z515 Encounter for palliative care: Secondary | ICD-10-CM | POA: Diagnosis not present

## 2022-06-14 NOTE — Telephone Encounter (Signed)
Spoke with Dominica Severin, NP, who works with palliative care.  1/2 dose of metolazone each week instead of whole dose.   Please refer back to Nephrology  (we had referred back in Dec 2023, but he has been in and out of the hospital). Thanks!

## 2022-06-15 ENCOUNTER — Other Ambulatory Visit: Payer: Self-pay | Admitting: *Deleted

## 2022-06-15 DIAGNOSIS — K7460 Unspecified cirrhosis of liver: Secondary | ICD-10-CM

## 2022-06-15 NOTE — Addendum Note (Signed)
Addended by: Madelin Rear on: 06/15/2022 09:27 AM   Modules accepted: Orders

## 2022-06-15 NOTE — Telephone Encounter (Signed)
Referral was placed on 04/20/22 to [facility]. Called the office and they did not see the referral and asked for records to be faxed and they would get pt in. FYI

## 2022-06-18 ENCOUNTER — Telehealth: Payer: Self-pay | Admitting: Gastroenterology

## 2022-06-18 NOTE — Telephone Encounter (Signed)
Daniel Crawford made a note to schedule patient in 2-4 weeks.  I called him to schedule and he said he had already spoke to Daniel Crawford he didn't think he needed an appt.  Please advise.

## 2022-06-18 NOTE — Telephone Encounter (Signed)
Yes, he still needs an appointment. Thanks!

## 2022-07-02 DIAGNOSIS — I5032 Chronic diastolic (congestive) heart failure: Secondary | ICD-10-CM | POA: Diagnosis not present

## 2022-07-02 DIAGNOSIS — Z515 Encounter for palliative care: Secondary | ICD-10-CM | POA: Diagnosis not present

## 2022-07-02 DIAGNOSIS — K7581 Nonalcoholic steatohepatitis (NASH): Secondary | ICD-10-CM | POA: Diagnosis not present

## 2022-07-04 ENCOUNTER — Encounter (HOSPITAL_COMMUNITY): Payer: Self-pay | Admitting: Emergency Medicine

## 2022-07-04 ENCOUNTER — Other Ambulatory Visit: Payer: Self-pay

## 2022-07-04 ENCOUNTER — Emergency Department (HOSPITAL_COMMUNITY): Payer: Medicare HMO

## 2022-07-04 ENCOUNTER — Inpatient Hospital Stay (HOSPITAL_COMMUNITY)
Admission: EM | Admit: 2022-07-04 | Discharge: 2022-07-08 | DRG: 432 | Disposition: A | Discharge status: Home / Self Care | Payer: Medicare HMO | Attending: Internal Medicine | Admitting: Internal Medicine

## 2022-07-04 DIAGNOSIS — K3189 Other diseases of stomach and duodenum: Secondary | ICD-10-CM | POA: Diagnosis present

## 2022-07-04 DIAGNOSIS — D120 Benign neoplasm of cecum: Secondary | ICD-10-CM | POA: Diagnosis not present

## 2022-07-04 DIAGNOSIS — K7682 Hepatic encephalopathy: Secondary | ICD-10-CM | POA: Diagnosis not present

## 2022-07-04 DIAGNOSIS — Z86010 Personal history of colonic polyps: Secondary | ICD-10-CM

## 2022-07-04 DIAGNOSIS — F419 Anxiety disorder, unspecified: Secondary | ICD-10-CM | POA: Diagnosis not present

## 2022-07-04 DIAGNOSIS — I10 Essential (primary) hypertension: Secondary | ICD-10-CM | POA: Diagnosis not present

## 2022-07-04 DIAGNOSIS — J45909 Unspecified asthma, uncomplicated: Secondary | ICD-10-CM | POA: Diagnosis not present

## 2022-07-04 DIAGNOSIS — I509 Heart failure, unspecified: Secondary | ICD-10-CM | POA: Diagnosis not present

## 2022-07-04 DIAGNOSIS — K7469 Other cirrhosis of liver: Secondary | ICD-10-CM

## 2022-07-04 DIAGNOSIS — R188 Other ascites: Secondary | ICD-10-CM | POA: Diagnosis not present

## 2022-07-04 DIAGNOSIS — I5033 Acute on chronic diastolic (congestive) heart failure: Secondary | ICD-10-CM | POA: Diagnosis not present

## 2022-07-04 DIAGNOSIS — M7989 Other specified soft tissue disorders: Secondary | ICD-10-CM | POA: Diagnosis not present

## 2022-07-04 DIAGNOSIS — M1610 Unilateral primary osteoarthritis, unspecified hip: Secondary | ICD-10-CM | POA: Diagnosis not present

## 2022-07-04 DIAGNOSIS — K7460 Unspecified cirrhosis of liver: Principal | ICD-10-CM | POA: Diagnosis present

## 2022-07-04 DIAGNOSIS — N183 Chronic kidney disease, stage 3 unspecified: Secondary | ICD-10-CM | POA: Insufficient documentation

## 2022-07-04 DIAGNOSIS — R0602 Shortness of breath: Secondary | ICD-10-CM | POA: Diagnosis not present

## 2022-07-04 DIAGNOSIS — Z9049 Acquired absence of other specified parts of digestive tract: Secondary | ICD-10-CM

## 2022-07-04 DIAGNOSIS — Z87891 Personal history of nicotine dependence: Secondary | ICD-10-CM

## 2022-07-04 DIAGNOSIS — D6959 Other secondary thrombocytopenia: Secondary | ICD-10-CM | POA: Diagnosis present

## 2022-07-04 DIAGNOSIS — I272 Pulmonary hypertension, unspecified: Secondary | ICD-10-CM | POA: Diagnosis present

## 2022-07-04 DIAGNOSIS — Z79899 Other long term (current) drug therapy: Secondary | ICD-10-CM

## 2022-07-04 DIAGNOSIS — K219 Gastro-esophageal reflux disease without esophagitis: Secondary | ICD-10-CM | POA: Diagnosis not present

## 2022-07-04 DIAGNOSIS — R601 Generalized edema: Secondary | ICD-10-CM | POA: Diagnosis not present

## 2022-07-04 DIAGNOSIS — G4733 Obstructive sleep apnea (adult) (pediatric): Secondary | ICD-10-CM | POA: Diagnosis not present

## 2022-07-04 DIAGNOSIS — K7200 Acute and subacute hepatic failure without coma: Secondary | ICD-10-CM | POA: Diagnosis present

## 2022-07-04 DIAGNOSIS — K766 Portal hypertension: Secondary | ICD-10-CM | POA: Diagnosis not present

## 2022-07-04 DIAGNOSIS — E039 Hypothyroidism, unspecified: Secondary | ICD-10-CM | POA: Diagnosis present

## 2022-07-04 DIAGNOSIS — E871 Hypo-osmolality and hyponatremia: Secondary | ICD-10-CM | POA: Diagnosis present

## 2022-07-04 DIAGNOSIS — F32A Depression, unspecified: Secondary | ICD-10-CM | POA: Diagnosis present

## 2022-07-04 DIAGNOSIS — I130 Hypertensive heart and chronic kidney disease with heart failure and stage 1 through stage 4 chronic kidney disease, or unspecified chronic kidney disease: Secondary | ICD-10-CM | POA: Diagnosis not present

## 2022-07-04 DIAGNOSIS — D696 Thrombocytopenia, unspecified: Secondary | ICD-10-CM | POA: Diagnosis present

## 2022-07-04 DIAGNOSIS — Z792 Long term (current) use of antibiotics: Secondary | ICD-10-CM

## 2022-07-04 DIAGNOSIS — E119 Type 2 diabetes mellitus without complications: Secondary | ICD-10-CM

## 2022-07-04 DIAGNOSIS — E1122 Type 2 diabetes mellitus with diabetic chronic kidney disease: Secondary | ICD-10-CM | POA: Diagnosis present

## 2022-07-04 DIAGNOSIS — R772 Abnormality of alphafetoprotein: Secondary | ICD-10-CM | POA: Diagnosis present

## 2022-07-04 DIAGNOSIS — Z6841 Body Mass Index (BMI) 40.0 and over, adult: Secondary | ICD-10-CM | POA: Diagnosis not present

## 2022-07-04 DIAGNOSIS — K7581 Nonalcoholic steatohepatitis (NASH): Secondary | ICD-10-CM | POA: Diagnosis not present

## 2022-07-04 DIAGNOSIS — R9431 Abnormal electrocardiogram [ECG] [EKG]: Secondary | ICD-10-CM | POA: Diagnosis present

## 2022-07-04 DIAGNOSIS — Z8249 Family history of ischemic heart disease and other diseases of the circulatory system: Secondary | ICD-10-CM

## 2022-07-04 DIAGNOSIS — Z79890 Hormone replacement therapy (postmenopausal): Secondary | ICD-10-CM

## 2022-07-04 DIAGNOSIS — E66813 Obesity, class 3: Secondary | ICD-10-CM | POA: Diagnosis present

## 2022-07-04 DIAGNOSIS — R0609 Other forms of dyspnea: Secondary | ICD-10-CM | POA: Diagnosis not present

## 2022-07-04 DIAGNOSIS — N1831 Chronic kidney disease, stage 3a: Secondary | ICD-10-CM | POA: Diagnosis not present

## 2022-07-04 DIAGNOSIS — Z800 Family history of malignant neoplasm of digestive organs: Secondary | ICD-10-CM

## 2022-07-04 DIAGNOSIS — T502X6A Underdosing of carbonic-anhydrase inhibitors, benzothiadiazides and other diuretics, initial encounter: Secondary | ICD-10-CM | POA: Diagnosis present

## 2022-07-04 DIAGNOSIS — K7290 Hepatic failure, unspecified without coma: Secondary | ICD-10-CM | POA: Insufficient documentation

## 2022-07-04 LAB — COMPREHENSIVE METABOLIC PANEL
ALT: 19 U/L (ref 0–44)
AST: 48 U/L — ABNORMAL HIGH (ref 15–41)
Albumin: 3 g/dL — ABNORMAL LOW (ref 3.5–5.0)
Alkaline Phosphatase: 111 U/L (ref 38–126)
Anion gap: 11 (ref 5–15)
BUN: 13 mg/dL (ref 8–23)
CO2: 27 mmol/L (ref 22–32)
Calcium: 8.2 mg/dL — ABNORMAL LOW (ref 8.9–10.3)
Chloride: 95 mmol/L — ABNORMAL LOW (ref 98–111)
Creatinine, Ser: 1.35 mg/dL — ABNORMAL HIGH (ref 0.61–1.24)
GFR, Estimated: 59 mL/min — ABNORMAL LOW (ref >60)
Glucose, Bld: 148 mg/dL — ABNORMAL HIGH (ref 70–99)
Potassium: 3.6 mmol/L (ref 3.5–5.1)
Sodium: 133 mmol/L — ABNORMAL LOW (ref 135–145)
Total Bilirubin: 4.3 mg/dL — ABNORMAL HIGH (ref 0.3–1.2)
Total Protein: 7 g/dL (ref 6.5–8.1)

## 2022-07-04 LAB — CBC WITH DIFFERENTIAL/PLATELET
Abs Immature Granulocytes: 0.02 10*3/uL (ref 0.00–0.07)
Basophils Absolute: 0 10*3/uL (ref 0.0–0.1)
Basophils Relative: 1 %
Eosinophils Absolute: 0.3 10*3/uL (ref 0.0–0.5)
Eosinophils Relative: 6 %
HCT: 36.4 % — ABNORMAL LOW (ref 39.0–52.0)
Hemoglobin: 11.9 g/dL — ABNORMAL LOW (ref 13.0–17.0)
Immature Granulocytes: 0 %
Lymphocytes Relative: 15 %
Lymphs Abs: 0.8 10*3/uL (ref 0.7–4.0)
MCH: 29.5 pg (ref 26.0–34.0)
MCHC: 32.7 g/dL (ref 30.0–36.0)
MCV: 90.3 fL (ref 80.0–100.0)
Monocytes Absolute: 0.4 10*3/uL (ref 0.1–1.0)
Monocytes Relative: 7 %
Neutro Abs: 4 10*3/uL (ref 1.7–7.7)
Neutrophils Relative %: 71 %
Platelets: 85 10*3/uL — ABNORMAL LOW (ref 150–400)
RBC: 4.03 MIL/uL — ABNORMAL LOW (ref 4.22–5.81)
RDW: 16.5 % — ABNORMAL HIGH (ref 11.5–15.5)
WBC: 5.6 10*3/uL (ref 4.0–10.5)
nRBC: 0 % (ref 0.0–0.2)

## 2022-07-04 LAB — BRAIN NATRIURETIC PEPTIDE: B Natriuretic Peptide: 47 pg/mL (ref 0.0–100.0)

## 2022-07-04 LAB — MAGNESIUM: Magnesium: 2 mg/dL (ref 1.7–2.4)

## 2022-07-04 LAB — CBG MONITORING, ED: Glucose-Capillary: 130 mg/dL — ABNORMAL HIGH (ref 70–99)

## 2022-07-04 MED ORDER — POTASSIUM CHLORIDE CRYS ER 20 MEQ PO TBCR
40.0000 meq | EXTENDED_RELEASE_TABLET | Freq: Every day | ORAL | Status: DC
  Administered 2022-07-04 – 2022-07-08 (×5): 40 meq via ORAL
  Filled 2022-07-04 (×5): qty 2

## 2022-07-04 MED ORDER — BOOST PO LIQD
237.0000 mL | Freq: Every evening | ORAL | Status: DC
  Administered 2022-07-05 – 2022-07-07 (×3): 237 mL via ORAL
  Filled 2022-07-04 (×5): qty 237

## 2022-07-04 MED ORDER — TRAZODONE HCL 50 MG PO TABS
50.0000 mg | ORAL_TABLET | Freq: Every day | ORAL | Status: DC
  Administered 2022-07-04: 50 mg via ORAL
  Filled 2022-07-04: qty 1

## 2022-07-04 MED ORDER — ALBUTEROL SULFATE (2.5 MG/3ML) 0.083% IN NEBU: 2.5000 mg | INHALATION_SOLUTION | RESPIRATORY_TRACT | Status: DC | PRN

## 2022-07-04 MED ORDER — LEVOTHYROXINE SODIUM 75 MCG PO TABS
175.0000 ug | ORAL_TABLET | Freq: Every day | ORAL | Status: DC
  Administered 2022-07-05 – 2022-07-08 (×4): 175 ug via ORAL
  Filled 2022-07-04 (×4): qty 1

## 2022-07-04 MED ORDER — FUROSEMIDE 10 MG/ML IJ SOLN
40.0000 mg | Freq: Once | INTRAMUSCULAR | Status: AC
  Administered 2022-07-04: 40 mg via INTRAVENOUS
  Filled 2022-07-04: qty 4

## 2022-07-04 MED ORDER — DOCUSATE SODIUM 100 MG PO CAPS
100.0000 mg | ORAL_CAPSULE | Freq: Two times a day (BID) | ORAL | Status: DC
  Administered 2022-07-04 – 2022-07-08 (×7): 100 mg via ORAL
  Filled 2022-07-04 (×8): qty 1

## 2022-07-04 MED ORDER — ONDANSETRON HCL 4 MG/2ML IJ SOLN
4.0000 mg | Freq: Four times a day (QID) | INTRAMUSCULAR | Status: DC | PRN
  Administered 2022-07-05 – 2022-07-06 (×2): 4 mg via INTRAVENOUS
  Filled 2022-07-04 (×2): qty 2

## 2022-07-04 MED ORDER — RIFAXIMIN 550 MG PO TABS
550.0000 mg | ORAL_TABLET | Freq: Two times a day (BID) | ORAL | Status: DC
  Administered 2022-07-04 – 2022-07-08 (×8): 550 mg via ORAL
  Filled 2022-07-04 (×8): qty 1

## 2022-07-04 MED ORDER — GABAPENTIN 100 MG PO CAPS
100.0000 mg | ORAL_CAPSULE | Freq: Every day | ORAL | Status: DC | PRN
  Administered 2022-07-05 – 2022-07-06 (×2): 100 mg via ORAL
  Filled 2022-07-04 (×3): qty 1

## 2022-07-04 MED ORDER — SERTRALINE HCL 50 MG PO TABS
50.0000 mg | ORAL_TABLET | Freq: Every day | ORAL | Status: DC
  Administered 2022-07-04: 50 mg via ORAL
  Filled 2022-07-04: qty 1

## 2022-07-04 MED ORDER — FUROSEMIDE 10 MG/ML IJ SOLN
60.0000 mg | Freq: Two times a day (BID) | INTRAMUSCULAR | Status: DC
  Administered 2022-07-05 – 2022-07-07 (×5): 60 mg via INTRAVENOUS
  Filled 2022-07-04 (×6): qty 6

## 2022-07-04 MED ORDER — PANTOPRAZOLE SODIUM 40 MG PO TBEC
40.0000 mg | DELAYED_RELEASE_TABLET | Freq: Every day | ORAL | Status: DC
  Administered 2022-07-04 – 2022-07-08 (×5): 40 mg via ORAL
  Filled 2022-07-04 (×4): qty 1

## 2022-07-04 MED ORDER — CARVEDILOL 3.125 MG PO TABS
6.2500 mg | ORAL_TABLET | Freq: Two times a day (BID) | ORAL | Status: DC
  Administered 2022-07-05 – 2022-07-08 (×7): 6.25 mg via ORAL
  Filled 2022-07-04 (×7): qty 2

## 2022-07-04 MED ORDER — SPIRONOLACTONE 25 MG PO TABS
100.0000 mg | ORAL_TABLET | Freq: Every day | ORAL | Status: DC
  Administered 2022-07-04 – 2022-07-05 (×2): 100 mg via ORAL
  Filled 2022-07-04: qty 4
  Filled 2022-07-04: qty 1

## 2022-07-04 MED ORDER — HYDRALAZINE HCL 20 MG/ML IJ SOLN: 5.0000 mg | INTRAMUSCULAR | Status: DC | PRN

## 2022-07-04 MED ORDER — ONDANSETRON HCL 4 MG PO TABS: 4.0000 mg | ORAL_TABLET | Freq: Four times a day (QID) | ORAL | Status: DC | PRN

## 2022-07-04 NOTE — ED Provider Notes (Signed)
[place] EMERGENCY DEPARTMENT AT [facility] Provider Note   CSN: [id number] Arrival date & time: 07/04/22  1720     History  Chief Complaint  Patient presents with   Shortness of Breath   Leg Swelling    Daniel Crawford is a 67 y.o. male.  Patient has a history of cirrhosis and congestive heart failure.  He states he has been gaining a lot of weight and has tremendous fluid in his legs and abdomen.  Patient complains of having no energy  The history is provided by the patient and medical records. No language interpreter was used.  Shortness of Breath Severity:  Moderate Onset quality:  Sudden Timing:  Constant Progression:  Worsening Chronicity:  Recurrent Context: activity   Relieved by:  Nothing Worsened by:  Nothing Ineffective treatments:  None tried Associated symptoms: no abdominal pain, no chest pain, no cough, no headaches and no rash   Risk factors: no recent alcohol use        Home Medications Prior to Admission medications   Medication Sig Start Date End Date Taking? Authorizing Provider  acetaminophen (TYLENOL) 500 MG tablet Take 1,000 mg by mouth every 6 (six) hours as needed for moderate pain.    [provider]  albuterol (VENTOLIN HFA) 108 (90 Base) MCG/ACT inhaler Inhale 2 puffs into the lungs every 6 (six) hours as needed for wheezing or shortness of breath. 05/01/22   Oretha MilchAlva, Rakesh V, MD  carvedilol (COREG) 6.25 MG tablet Take 1 tablet (6.25 mg total) by mouth 2 (two) times daily with a meal. 01/25/22   Emokpae, Courage, MD  gabapentin (NEURONTIN) 100 MG capsule Take 100 mg by mouth daily as needed (pain). 02/20/21   [provider]  hydrOXYzine (VISTARIL) 50 MG capsule Take 1 capsule (50 mg total) by mouth 3 (three) times daily as needed for anxiety or nausea. 01/25/22   Shon HaleEmokpae, Courage, MD  levothyroxine (SYNTHROID) 175 MCG tablet Take 175 mcg by mouth daily before breakfast.    [provider]  metolazone (ZAROXOLYN) 2.5  MG tablet Take 1 tablet (2.5 mg total) by mouth once a week. 05/30/22   Vassie LollMadera, Carlos, MD  ondansetron (ZOFRAN) 8 MG tablet Take by mouth every 8 (eight) hours as needed for nausea or vomiting.    [provider]  pantoprazole (PROTONIX) 40 MG tablet Take 1 tablet (40 mg total) by mouth daily. 01/25/22   Shon HaleEmokpae, Courage, MD  potassium chloride SA (KLOR-CON M) 20 MEQ tablet Take 2 tablets (40 mEq total) by mouth daily. Patient taking differently: Take 40 mEq by mouth 2 (two) times daily. 03/05/22   Johnson, Clanford L, MD  rifaximin (XIFAXAN) 550 MG TABS tablet Take 1 tablet (550 mg total) by mouth 2 (two) times daily. 01/25/22   Shon HaleEmokpae, Courage, MD  sertraline (ZOLOFT) 50 MG tablet Take 1 tablet (50 mg total) by mouth at bedtime. 01/25/22   Shon HaleEmokpae, Courage, MD  spironolactone (ALDACTONE) 100 MG tablet Take 1 tablet (100 mg total) by mouth daily. 03/04/22   Johnson, Clanford L, MD  torsemide 40 MG TABS Take 40 mg by mouth 2 (two) times daily. Patient taking differently: Take 40 mg by mouth daily. 04/10/22   Catarina Hartshornat, David, MD  traZODone (DESYREL) 50 MG tablet Take 1 tablet (50 mg total) by mouth at bedtime. 01/25/22   Shon HaleEmokpae, Courage, MD      Allergies    Patient has no known allergies.    Review of Systems   Review of Systems  Constitutional:  Negative for appetite change and fatigue.  HENT:  Negative for congestion, ear discharge and sinus pressure.   Eyes:  Negative for discharge.  Respiratory:  Positive for shortness of breath. Negative for cough.   Cardiovascular:  Negative for chest pain.  Gastrointestinal:  Negative for abdominal pain and diarrhea.  Genitourinary:  Negative for frequency and hematuria.  Musculoskeletal:  Negative for back pain.  Skin:  Negative for rash.  Neurological:  Negative for seizures and headaches.  Psychiatric/Behavioral:  Negative for hallucinations.     Physical Exam Updated Vital Signs BP (!) 155/81   Pulse 72   Temp 98.5 F (36.9 C) (Oral)    Resp 11   Wt (!) 185.5 kg   SpO2 97%   BMI 52.51 kg/m  Physical Exam Vitals and nursing note reviewed.  Constitutional:      Appearance: He is well-developed.  HENT:     Head: Normocephalic.     Nose: Nose normal.  Eyes:     General: No scleral icterus.    Conjunctiva/sclera: Conjunctivae normal.  Neck:     Thyroid: No thyromegaly.  Cardiovascular:     Rate and Rhythm: Normal rate and regular rhythm.     Heart sounds: No murmur heard.    No friction rub. No gallop.  Pulmonary:     Breath sounds: No stridor. No wheezing or rales.  Chest:     Chest wall: No tenderness.  Abdominal:     General: There is no distension.     Tenderness: There is no abdominal tenderness. There is no rebound.  Musculoskeletal:        General: Normal range of motion.     Cervical back: Neck supple.     Comments: Significant edema in legs and abdomen  Lymphadenopathy:     Cervical: No cervical adenopathy.  Skin:    Findings: No erythema or rash.  Neurological:     Mental Status: He is alert and oriented to person, place, and time.     Motor: No abnormal muscle tone.     Coordination: Coordination normal.  Psychiatric:        Behavior: Behavior normal.     ED Results / Procedures / Treatments   Labs (all labs ordered are listed, but only abnormal results are displayed) Labs Reviewed  CBC WITH DIFFERENTIAL/PLATELET - Abnormal; Notable for the following components:      Result Value   RBC 4.03 (*)    Hemoglobin 11.9 (*)    HCT 36.4 (*)    RDW 16.5 (*)    Platelets 85 (*)    All other components within normal limits  COMPREHENSIVE METABOLIC PANEL - Abnormal; Notable for the following components:   Sodium 133 (*)    Chloride 95 (*)    Glucose, Bld 148 (*)    Creatinine, Ser 1.35 (*)    Calcium 8.2 (*)    Albumin 3.0 (*)    AST 48 (*)    Total Bilirubin 4.3 (*)    GFR, Estimated 59 (*)    All other components within normal limits  BRAIN NATRIURETIC PEPTIDE     EKG None  Radiology DG Chest Port 1 View  Result Date: 07/04/2022 CLINICAL DATA:  Shortness of breath with bilateral leg swelling and abdominal swelling. EXAM: PORTABLE CHEST 1 VIEW COMPARISON:  May 21, 2022 FINDINGS: The heart size and mediastinal contours are within normal limits. Both lungs are clear. The visualized skeletal structures are unremarkable. IMPRESSION: No active disease. Electronically  Signed   By: Aram Candela M.D.   On: 07/04/2022 17:56    Procedures Procedures    Medications Ordered in ED Medications  furosemide (LASIX) injection 40 mg (has no administration in time range)    ED Course/ Medical Decision Making/ A&P                             Medical Decision Making Amount and/or Complexity of Data Reviewed Labs: ordered. Radiology: ordered. ECG/medicine tests: ordered.  Risk Prescription drug management. Decision regarding hospitalization.  This patient presents to the ED for concern of shortness of breath and swelling, this involves an extensive number of treatment options, and is a complaint that carries with it a high risk of complications and morbidity.  The differential diagnosis includes anasarca, pneumonia    Co morbidities that complicate the patient evaluation  Cirrhosis   Additional history obtained:  Additional history obtained from patient External records from outside source obtained and reviewed including hospital records   Lab Tests:  I Ordered, and personally interpreted labs.  The pertinent results include: Hemoglobin 11.9, platelets 85   Imaging Studies ordered:  I ordered imaging studies including chest x-ray I independently visualized and interpreted imaging which showed negative I agree with the radiologist interpretation   Cardiac Monitoring: / EKG:  The patient was maintained on a cardiac monitor.  I personally viewed and interpreted the cardiac monitored which showed an underlying rhythm of: Normal  sinus rhythm   Consultations Obtained:  I requested consultation with the hospitalist,  and discussed lab and imaging findings as well as pertinent plan - they recommend: Admit   Problem List / ED Course / Critical interventions / Medication management  Cirrhosis and anasarca I ordered medication including Lasix for anasarca Reevaluation of the patient after these medicines showed that the patient stayed the same I have reviewed the patients home medicines and have made adjustments as needed   Social Determinants of Health:  None   Test / Admission - Considered:  None  Patient with anasarca.  He will be admitted to medicine and diuresed        Final Clinical Impression(s) / ED Diagnoses Final diagnoses:  Anasarca    Rx / DC Orders ED Discharge Orders     None         Bethann Berkshire, MD 07/06/22 1231

## 2022-07-04 NOTE — ED Triage Notes (Signed)
SOB, worse on exertion. Bilateral leg swelling and abd swelling. Pt was admitted 1 month ago for the same. Denies any pain.

## 2022-07-04 NOTE — H&P (Addendum)
TRH H&P   Patient Demographics:    Danell Verno, is a 67 y.o. male  MRN: [id number]   DOB - [birth year]  Admit Date - 07/04/2022  Outpatient Primary MD for the patient is [facility]  Referring MD/NP/PA: [email or web address]  Patient coming from: home  Chief Complaint  Patient presents with   Shortness of Breath   Leg Swelling      HPI:    Danna Parlow  is a 67 y.o. male, with a history of diabetes mellitus type 2, hypertension, hypothyroidism, NASH liver cirrhosis . -Patient presents to ED secondary to shortness of breath, and fluid retention, patient with known history of liver cirrhosis, on multiple diuretics, reports he has been compliant with his diuretics, fluid restriction and salt restriction, but he has not been taking Zaroxolyn (once weekly) as he think it does cause him to have memory problems, reported dyspnea, progressive, as well orthopnea, he denies fever, chills or abdominal pain, no nausea, vomiting or diarrhea -In ED with 185 kg, recent weight on discharge 05/25/2022 was 171, labs significant for sodium of 133, creatinine at baseline of 1.35, total bili elevated at 4.3, which is around baseline, platelet count at 85 K, improved from baseline in the 50s, [facility] hospitalist consulted to admit.   Review of systems:    A full 10 point Review of Systems was done, except as stated above, all other Review of Systems were negative.   With Past History of the following :    Past Medical History:  Diagnosis Date   Anemia    Anxiety    Asthma    CHF (congestive heart failure)    CKD (chronic kidney disease)    Depression    Diabetes mellitus without complication    Dyspnea    Hypertension    Hypothyroidism    Liver cirrhosis secondary to NASH    NASH (nonalcoholic steatohepatitis)    Pre-diabetes    Spleen enlarged    Thrombocytopenia       Past  Surgical History:  Procedure Laterality Date   Bilateral hernia surgery     2006, 2007   CHOLECYSTECTOMY     2016    COLONOSCOPY WITH PROPOFOL N/A 06/28/2021   Procedure: COLONOSCOPY WITH PROPOFOL;  Surgeon: Malissa Hippo, MD;  [facility];  Service: Endoscopy;  Laterality: N/A;  1020   ESOPHAGEAL DILATION N/A 11/06/2019   Procedure: ESOPHAGEAL DILATION;  Surgeon: Dolores Frame, MD;  [facility];  Service: Gastroenterology;  Laterality: N/A;   ESOPHAGOGASTRODUODENOSCOPY (EGD) WITH PROPOFOL N/A 11/06/2019   Procedure: ESOPHAGOGASTRODUODENOSCOPY (EGD) WITH PROPOFOL;  Surgeon: Dolores Frame, MD;  [facility];  Service: Gastroenterology;  Laterality: N/A;  1045   IR RADIOLOGIST EVAL & MGMT  02/19/2022   LAPAROSCOPIC ASSISTED VENTRAL HERNIA REPAIR     Polyp removed     in January of  2018.    POLYPECTOMY  06/28/2021   Procedure: POLYPECTOMY INTESTINAL;  Surgeon: Malissa Hippo, MD;  [facility];  Service: Endoscopy;;   RIGHT HEART CATH N/A 01/08/2022   Procedure: RIGHT HEART CATH;  Surgeon: Corky Crafts, MD;  Location: [facility] INVASIVE CV LAB;  Service: Cardiovascular;  Laterality: N/A;      Social History:     Social History   Tobacco Use   Smoking status: Former    Packs/day: 1.00    Years: 20.00    Additional pack years: 0.00    Total pack years: 20.00    Types: Cigarettes    Quit date: 2017    Years since quitting: 7.2   Smokeless tobacco: Never  Substance Use Topics   Alcohol use: No      Family History :     Family History  Problem Relation Age of Onset   Liver cancer Mother    Heart disease Mother    Aneurysm Sister      Home Medications:   Prior to Admission medications   Medication Sig Start Date End Date Taking? Authorizing Provider  acetaminophen (TYLENOL) 500 MG tablet Take 1,000 mg by mouth every 6 (six) hours as needed for moderate pain.   Yes [provider]   albuterol (VENTOLIN HFA) 108 (90 Base) MCG/ACT inhaler Inhale 2 puffs into the lungs every 6 (six) hours as needed for wheezing or shortness of breath. 05/01/22  Yes Oretha Milch, MD  carvedilol (COREG) 6.25 MG tablet Take 1 tablet (6.25 mg total) by mouth 2 (two) times daily with a meal. 01/25/22  Yes Emokpae, Courage, MD  gabapentin (NEURONTIN) 100 MG capsule Take 100 mg by mouth daily as needed (pain). 02/20/21  Yes [provider]  hydrOXYzine (VISTARIL) 50 MG capsule Take 1 capsule (50 mg total) by mouth 3 (three) times daily as needed for anxiety or nausea. 01/25/22  Yes Emokpae, Courage, MD  lactose free nutrition (BOOST) LIQD Take 237 mLs by mouth every evening.   Yes [provider]  levothyroxine (SYNTHROID) 175 MCG tablet Take 175 mcg by mouth daily before breakfast.   Yes [provider]  metolazone (ZAROXOLYN) 2.5 MG tablet Take 1 tablet (2.5 mg total) by mouth once a week. 05/30/22  Yes Vassie Loll, MD  ondansetron (ZOFRAN) 8 MG tablet Take by mouth every 8 (eight) hours as needed for nausea or vomiting.   Yes [provider]  pantoprazole (PROTONIX) 40 MG tablet Take 1 tablet (40 mg total) by mouth daily. 01/25/22  Yes Emokpae, Courage, MD  potassium chloride SA (KLOR-CON M) 20 MEQ tablet Take 2 tablets (40 mEq total) by mouth daily. Patient taking differently: Take 40 mEq by mouth 2 (two) times daily. 03/05/22  Yes Johnson, Clanford L, MD  rifaximin (XIFAXAN) 550 MG TABS tablet Take 1 tablet (550 mg total) by mouth 2 (two) times daily. 01/25/22  Yes Emokpae, Courage, MD  sertraline (ZOLOFT) 50 MG tablet Take 1 tablet (50 mg total) by mouth at bedtime. 01/25/22  Yes Emokpae, Courage, MD  spironolactone (ALDACTONE) 100 MG tablet Take 1 tablet (100 mg total) by mouth daily. 03/04/22  Yes Johnson, Clanford L, MD  torsemide 40 MG TABS Take 40 mg by mouth 2 (two) times daily. Patient taking differently: Take 40 mg by mouth daily. 04/10/22  Yes Tat, Onalee Hua, MD   traZODone (DESYREL) 50 MG tablet Take 1 tablet (50 mg total) by mouth at bedtime. 01/25/22  Yes Shon Hale, MD  furosemide (LASIX) 20 MG tablet Take 60 mg by mouth 2 (two) times daily. Patient not taking: Reported on 07/04/2022 04/23/22   [provider]  metolazone (ZAROXOLYN) 5 MG tablet Take 5 mg by mouth every other day. Patient not taking: Reported on 07/04/2022 05/31/22   [provider]     Allergies:    No Known Allergies   Physical Exam:   Vitals  Blood pressure (!) 158/77, pulse 82, temperature 98.5 F (36.9 C), temperature source Oral, resp. rate (!) 22, weight (!) 185.5 kg, SpO2 96 %.   1. General well-developed male, with morbid obesity patient with profuse anasarca  2. Normal affect and insight, Not Suicidal or Homicidal, Awake Alert, Oriented X 3.  3. No F.N deficits, ALL C.Nerves Intact, Strength 5/5 all 4 extremities, Sensation intact all 4 extremities, Plantars down going.  4. Ears and Eyes appear Normal, Conjunctivae clear, PERRLA. Moist Oral Mucosa.  5. Supple Neck, No JVD, No cervical lymphadenopathy appriciated, No Carotid Bruits.  6. Symmetrical Chest wall movement, Minister entry at the bases  7. RRR, No Gallops, Rubs or Murmurs, No Parasternal Heave.  2 edema  8. Positive Bowel Sounds, and with significant abdominal wall edema, with morbid obesity unable to appreciate any ascites due to body habitus.  9.  No Cyanosis, Normal Skin Turgor, No Skin Rash or Bruise.  10. Good muscle tone,  joints appear normal , no effusions, Normal ROM.     Data Review:    CBC Recent Labs  Lab 07/04/22 1734  WBC 5.6  HGB 11.9*  HCT 36.4*  PLT 85*  MCV 90.3  MCH 29.5  MCHC 32.7  RDW 16.5*  LYMPHSABS 0.8  MONOABS 0.4  EOSABS 0.3  BASOSABS 0.0   ------------------------------------------------------------------------------------------------------------------  Chemistries  Recent Labs  Lab 07/04/22 1734  NA 133*  K 3.6  CL 95*   CO2 27  GLUCOSE 148*  BUN 13  CREATININE 1.35*  CALCIUM 8.2*  AST 48*  ALT 19  ALKPHOS 111  BILITOT 4.3*   ------------------------------------------------------------------------------------------------------------------ estimated creatinine clearance is 96.6 mL/min (A) (by C-G formula based on SCr of 1.35 mg/dL (H)). ------------------------------------------------------------------------------------------------------------------ No results for input(s): "TSH", "T4TOTAL", "T3FREE", "THYROIDAB" in the last 72 hours.  Invalid input(s): "FREET3"  Coagulation profile No results for input(s): "INR", "PROTIME" in the last 168 hours. ------------------------------------------------------------------------------------------------------------------- No results for input(s): "DDIMER" in the last 72 hours. -------------------------------------------------------------------------------------------------------------------  Cardiac Enzymes No results for input(s): "CKMB", "TROPONINI", "MYOGLOBIN" in the last 168 hours.  Invalid input(s): "CK" ------------------------------------------------------------------------------------------------------------------    Component Value Date/Time   BNP 47.0 07/04/2022 1734     ---------------------------------------------------------------------------------------------------------------  Urinalysis    Component Value Date/Time   COLORURINE STRAW (A) 02/28/2022 0840   APPEARANCEUR CLEAR 02/28/2022 0840   LABSPEC 1.005 02/28/2022 0840   PHURINE 6.0 02/28/2022 0840   GLUCOSEU NEGATIVE 02/28/2022 0840   HGBUR SMALL (A) 02/28/2022 0840   BILIRUBINUR NEGATIVE 02/28/2022 0840   KETONESUR NEGATIVE 02/28/2022 0840   PROTEINUR NEGATIVE 02/28/2022 0840   NITRITE NEGATIVE 02/28/2022 0840   LEUKOCYTESUR NEGATIVE 02/28/2022 0840    ----------------------------------------------------------------------------------------------------------------    Imaging Results:    DG Chest Port 1 View  Result Date: 07/04/2022 CLINICAL DATA:  Shortness of breath with bilateral leg swelling and abdominal swelling. EXAM: PORTABLE CHEST 1 VIEW COMPARISON:  May 21, 2022 FINDINGS: The heart size and mediastinal contours are within normal limits. Both lungs are clear. The visualized skeletal structures are unremarkable. IMPRESSION: No active disease. Electronically Signed   By: Waylan Rocher  [place] M.D.   On: 07/04/2022 17:56    EKG  Vent. rate 76 BPM PR interval 211 ms QRS duration 100 ms QT/QTcB 448/504 ms P-R-T axes 37 26 40 Sinus rhythm Low voltage, precordial leads Consider anterior infarct Prolonged QT interval   Assessment & Plan:    Principal Problem:   Decompensated liver disease Active Problems:   Acquired hypothyroidism   Cirrhosis of liver with ascites   Thrombocytopenia   OSA (obstructive sleep apnea)   Anasarca   Liver cirrhosis secondary to NASH   Acute exacerbation of CHF (congestive heart failure)   Morbid obesity    Decompensated liver cirrhosis with anasarca -Patient presents with significant evidence of volume overload, presents with anasarca . -Reports he has been compliant with fluid restriction, low-salt diet and Aldactone with torsemide, but has not been taking his Zaroxolyn . -Weight on recent discharge month was 171 kg, it is 185 kg this admission. -Sinew with IV Lasix 60 mg IV twice daily, continue with home dose Aldactone, likely will need to uptitrate his Lasix and Aldactone. -Continue with home Xifaxan -Will consult gastroenterology service.  Thrombocytopenia -Due to liver cirrhosis, continue with SCD for DVT prophylaxis   Diabetes mellitus type 2 -not on any medications at home, will monitor CBG  Essential hypertension -Sinew with home carvedilol, continue with heart healthy low-sodium diet -Heart healthy/low-sodium diet discussed with patient.   Hypothyroidism -Continue with home Synthroid    Depression/anxiety -Will hold Zoloft and trazodone due to prolonged QTc.  CKD 3a -Renal function at baseline, monitor closely in the setting of diuresis    Morbid obesity .BMI  Prolonged QTc -At 507, avoid prolonging agent,  on telemetry, will add magnesium level to earlier labs, and keep potassium>  4  DVT Prophylaxis  SCDs   AM Labs Ordered, also please review Full Orders  Family Communication: Admission, patients condition and plan of care including tests being ordered have been discussed with the patient who indicate understanding and agree with the plan and Code Status.  Code Status full  Likely DC to  home  Condition GUARDED    Consults called: gi requested by EPIC    Admission status: inpatient    Time spent in minutes : 70 minutes   Huey Bienenstockawood Emmy Keng M.D on 07/04/2022 at 9:33 PM   [facility] - Office  [phone]

## 2022-07-04 NOTE — ED Notes (Signed)
ED TO INPATIENT HANDOFF REPORT  ED Nurse Name and Phone #: Efraim Kaufmann, RN  S Name/Age/Gender Daniel Crawford 67 y.o. male Room/Bed: APA06/APA06  Code Status   Code Status: Full Code  Home/SNF/Other Home Patient oriented to: self, place, time, and situation Is this baseline? Yes   Triage Complete: Triage complete  Chief Complaint Decompensated liver disease [K74.69]  Triage Note SOB, worse on exertion. Bilateral leg swelling and abd swelling. Pt was admitted 1 month ago for the same. Denies any pain.    Allergies No Known Allergies  Level of Care/Admitting Diagnosis ED Disposition     ED Disposition  Admit   Condition  --   Comment  Hospital Area: [facility] [[id number]]  Level of Care: Telemetry [5]  Covid Evaluation: Confirmed COVID Negative  Diagnosis: Decompensated liver disease [[id number]]  Admitting Physician: Chiquita Loth  Attending Physician: Randol Kern, DAWOOD S [[id number]]  Certification:: I certify this patient will need inpatient services for at least 2 midnights  Estimated Length of Stay: 2          B Medical/Surgery History Past Medical History:  Diagnosis Date   Anemia    Anxiety    Asthma    CHF (congestive heart failure)    CKD (chronic kidney disease)    Depression    Diabetes mellitus without complication    Dyspnea    Hypertension    Hypothyroidism    Liver cirrhosis secondary to NASH    NASH (nonalcoholic steatohepatitis)    Pre-diabetes    Spleen enlarged    Thrombocytopenia    Past Surgical History:  Procedure Laterality Date   Bilateral hernia surgery     2006, 2007   CHOLECYSTECTOMY     2016    COLONOSCOPY WITH PROPOFOL N/A 06/28/2021   Procedure: COLONOSCOPY WITH PROPOFOL;  Surgeon: Malissa Hippo, MD;  [facility];  Service: Endoscopy;  Laterality: N/A;  1020   ESOPHAGEAL DILATION N/A 11/06/2019   Procedure: ESOPHAGEAL DILATION;  Surgeon: Dolores Frame, MD;  [facility];   Service: Gastroenterology;  Laterality: N/A;   ESOPHAGOGASTRODUODENOSCOPY (EGD) WITH PROPOFOL N/A 11/06/2019   Procedure: ESOPHAGOGASTRODUODENOSCOPY (EGD) WITH PROPOFOL;  Surgeon: Dolores Frame, MD;  [facility];  Service: Gastroenterology;  Laterality: N/A;  1045   IR RADIOLOGIST EVAL & MGMT  02/19/2022   LAPAROSCOPIC ASSISTED VENTRAL HERNIA REPAIR     Polyp removed     in January of 2018.    POLYPECTOMY  06/28/2021   Procedure: POLYPECTOMY INTESTINAL;  Surgeon: Malissa Hippo, MD;  [facility];  Service: Endoscopy;;   RIGHT HEART CATH N/A 01/08/2022   Procedure: RIGHT HEART CATH;  Surgeon: Corky Crafts, MD;  Location: [facility] INVASIVE CV LAB;  Service: Cardiovascular;  Laterality: N/A;     A IV Location/Drains/Wounds Patient Lines/Drains/Airways Status     Active Line/Drains/Airways     Name Placement date Placement time Site Days   Peripheral IV 07/04/22 20 G Left Antecubital 07/04/22  1738  Antecubital  less than 1            Intake/Output Last 24 hours  Intake/Output Summary (Last 24 hours) at 07/04/2022 2146 Last data filed at 07/04/2022 2146 Gross per 24 hour  Intake 480 ml  Output 1175 ml  Net -695 ml    Labs/Imaging Results for orders placed or performed during the hospital encounter of 07/04/22 (from the past 48 hour(s))  CBC with Differential  Status: Abnormal   Collection Time: 07/04/22  5:34 PM  Result Value Ref Range   WBC 5.6 4.0 - 10.5 K/uL   RBC 4.03 (L) 4.22 - 5.81 MIL/uL   Hemoglobin 11.9 (L) 13.0 - 17.0 g/dL   HCT 06.0 (L) 04.5 - 99.7 %   MCV 90.3 80.0 - 100.0 fL   MCH 29.5 26.0 - 34.0 pg   MCHC 32.7 30.0 - 36.0 g/dL   RDW 74.1 (H) 42.3 - 95.3 %   Platelets 85 (L) 150 - 400 K/uL    Comment: SPECIMEN CHECKED FOR CLOTS Immature Platelet Fraction Froio be clinically indicated, consider ordering this additional test [id number] REPEATED TO VERIFY PLATELET COUNT CONFIRMED BY SMEAR    nRBC 0.0 0.0 - 0.2 %    Neutrophils Relative % 71 %   Neutro Abs 4.0 1.7 - 7.7 K/uL   Lymphocytes Relative 15 %   Lymphs Abs 0.8 0.7 - 4.0 K/uL   Monocytes Relative 7 %   Monocytes Absolute 0.4 0.1 - 1.0 K/uL   Eosinophils Relative 6 %   Eosinophils Absolute 0.3 0.0 - 0.5 K/uL   Basophils Relative 1 %   Basophils Absolute 0.0 0.0 - 0.1 K/uL   WBC Morphology MORPHOLOGY UNREMARKABLE    RBC Morphology MORPHOLOGY UNREMARKABLE    Immature Granulocytes 0 %   Abs Immature Granulocytes 0.02 0.00 - 0.07 K/uL    Comment: Performed at [facility], [address] [place], [place] [address]  Comprehensive metabolic panel     Status: Abnormal   Collection Time: 07/04/22  5:34 PM  Result Value Ref Range   Sodium 133 (L) 135 - 145 mmol/L   Potassium 3.6 3.5 - 5.1 mmol/L   Chloride 95 (L) 98 - 111 mmol/L   CO2 27 22 - 32 mmol/L   Glucose, Bld 148 (H) 70 - 99 mg/dL    Comment: Glucose reference range applies only to samples taken after fasting for at least 8 hours.   BUN 13 8 - 23 mg/dL   Creatinine, Ser 8.37 (H) 0.61 - 1.24 mg/dL   Calcium 8.2 (L) 8.9 - 10.3 mg/dL   Total Protein 7.0 6.5 - 8.1 g/dL   Albumin 3.0 (L) 3.5 - 5.0 g/dL   AST 48 (H) 15 - 41 U/L   ALT 19 0 - 44 U/L   Alkaline Phosphatase 111 38 - 126 U/L   Total Bilirubin 4.3 (H) 0.3 - 1.2 mg/dL   GFR, Estimated 59 (L) >60 mL/min    Comment: (NOTE) Calculated using the CKD-EPI Creatinine Equation (2021)    Anion gap 11 5 - 15    Comment: Performed at [facility], [address] [place], [place] [address]  Brain natriuretic peptide     Status: None   Collection Time: 07/04/22  5:34 PM  Result Value Ref Range   B Natriuretic Peptide 47.0 0.0 - 100.0 pg/mL    Comment: Performed at [facility], [address], [place], [place] [address]   DG Chest Port 1 View  Result Date: 07/04/2022 CLINICAL DATA:  Shortness of breath with bilateral leg swelling and abdominal swelling. EXAM: PORTABLE CHEST 1 VIEW COMPARISON:  May 21, 2022 FINDINGS: The heart size  and mediastinal contours are within normal limits. Both lungs are clear. The visualized skeletal structures are unremarkable. IMPRESSION: No active disease. Electronically Signed   By: Aram Candela M.D.   On: 07/04/2022 17:56    Pending Labs [employer] (From admission, onward)     Start  Ordered   07/05/22 0500  Basic metabolic panel  Daily,   R      07/04/22 1935   07/05/22 0500  CBC  Tomorrow morning,   R        07/04/22 1935   07/04/22 2142  Magnesium  Add-on,   AD        07/04/22 2141            Vitals/Pain Today's Vitals   07/04/22 2000 07/04/22 2030 07/04/22 2100 07/04/22 2140  BP: (!) 166/94  (!) 158/77   Pulse: 72 73 82   Resp: 14 17 (!) 22   Temp:    98.1 F (36.7 C)  TempSrc:    Oral  SpO2: 99% 98% 96%   Weight:      PainSc:        Isolation Precautions No active isolations  Medications Medications  rifaximin (XIFAXAN) tablet 550 mg (550 mg Oral Given 07/04/22 2140)  carvedilol (COREG) tablet 6.25 mg (has no administration in time range)  spironolactone (ALDACTONE) tablet 100 mg (100 mg Oral Given 07/04/22 2003)  sertraline (ZOLOFT) tablet 50 mg (50 mg Oral Given 07/04/22 2140)  traZODone (DESYREL) tablet 50 mg (50 mg Oral Given 07/04/22 2140)  levothyroxine (SYNTHROID) tablet 175 mcg (has no administration in time range)  pantoprazole (PROTONIX) EC tablet 40 mg (40 mg Oral Given 07/04/22 2003)  gabapentin (NEURONTIN) capsule 100 mg (has no administration in time range)  lactose free nutrition (Boost) liquid 237 mL (has no administration in time range)  potassium chloride SA (KLOR-CON M) CR tablet 40 mEq (40 mEq Oral Given 07/04/22 2003)  ondansetron (ZOFRAN) tablet 4 mg (has no administration in time range)    Or  ondansetron (ZOFRAN) injection 4 mg (has no administration in time range)  docusate sodium (COLACE) capsule 100 mg (100 mg Oral Given 07/04/22 2140)  hydrALAZINE (APRESOLINE) injection 5 mg (has no administration in time range)   albuterol (PROVENTIL) (2.5 MG/3ML) 0.083% nebulizer solution 2.5 mg (has no administration in time range)  furosemide (LASIX) injection 60 mg (60 mg Intravenous Not Given 07/04/22 2142)  furosemide (LASIX) injection 40 mg (40 mg Intravenous Given 07/04/22 1923)    Mobility walks     Focused Assessments Cardiac Assessment Handoff:    Lab Results  Component Value Date   CKTOTAL 117 01/22/2022   Lab Results  Component Value Date   DDIMER 19.85 (H) 01/22/2022   Does the Patient currently have chest pain? No   , Pulmonary Assessment Handoff:  Lung sounds:   O2 Device: Room Air      R Recommendations: See Admitting Provider Note  Report given to:   Additional Notes: Pt using urinal at bedside

## 2022-07-05 ENCOUNTER — Inpatient Hospital Stay (HOSPITAL_COMMUNITY): Payer: Medicare HMO

## 2022-07-05 DIAGNOSIS — K7460 Unspecified cirrhosis of liver: Secondary | ICD-10-CM | POA: Diagnosis not present

## 2022-07-05 DIAGNOSIS — R601 Generalized edema: Secondary | ICD-10-CM | POA: Diagnosis not present

## 2022-07-05 DIAGNOSIS — D120 Benign neoplasm of cecum: Secondary | ICD-10-CM | POA: Diagnosis not present

## 2022-07-05 DIAGNOSIS — K7469 Other cirrhosis of liver: Secondary | ICD-10-CM | POA: Diagnosis not present

## 2022-07-05 DIAGNOSIS — K429 Umbilical hernia without obstruction or gangrene: Secondary | ICD-10-CM | POA: Insufficient documentation

## 2022-07-05 LAB — BASIC METABOLIC PANEL
Anion gap: 3 — ABNORMAL LOW (ref 5–15)
BUN: 13 mg/dL (ref 8–23)
CO2: 31 mmol/L (ref 22–32)
Calcium: 8.1 mg/dL — ABNORMAL LOW (ref 8.9–10.3)
Chloride: 100 mmol/L (ref 98–111)
Creatinine, Ser: 1.35 mg/dL — ABNORMAL HIGH (ref 0.61–1.24)
GFR, Estimated: 59 mL/min — ABNORMAL LOW (ref >60)
Glucose, Bld: 117 mg/dL — ABNORMAL HIGH (ref 70–99)
Potassium: 3.6 mmol/L (ref 3.5–5.1)
Sodium: 134 mmol/L — ABNORMAL LOW (ref 135–145)

## 2022-07-05 LAB — HEPATIC FUNCTION PANEL
ALT: 17 U/L (ref 0–44)
AST: 37 U/L (ref 15–41)
Albumin: 2.5 g/dL — ABNORMAL LOW (ref 3.5–5.0)
Alkaline Phosphatase: 91 U/L (ref 38–126)
Bilirubin, Direct: 1 mg/dL — ABNORMAL HIGH (ref 0.0–0.2)
Indirect Bilirubin: 2.8 mg/dL — ABNORMAL HIGH (ref 0.3–0.9)
Total Bilirubin: 3.8 mg/dL — ABNORMAL HIGH (ref 0.3–1.2)
Total Protein: 5.9 g/dL — ABNORMAL LOW (ref 6.5–8.1)

## 2022-07-05 LAB — MAGNESIUM: Magnesium: 2 mg/dL (ref 1.7–2.4)

## 2022-07-05 LAB — CBC
HCT: 31.6 % — ABNORMAL LOW (ref 39.0–52.0)
Hemoglobin: 10.3 g/dL — ABNORMAL LOW (ref 13.0–17.0)
MCH: 29 pg (ref 26.0–34.0)
MCHC: 32.6 g/dL (ref 30.0–36.0)
MCV: 89 fL (ref 80.0–100.0)
Platelets: 74 10*3/uL — ABNORMAL LOW (ref 150–400)
RBC: 3.55 MIL/uL — ABNORMAL LOW (ref 4.22–5.81)
RDW: 16.6 % — ABNORMAL HIGH (ref 11.5–15.5)
WBC: 4.1 10*3/uL (ref 4.0–10.5)
nRBC: 0 % (ref 0.0–0.2)

## 2022-07-05 LAB — GLUCOSE, CAPILLARY
Glucose-Capillary: 116 mg/dL — ABNORMAL HIGH (ref 70–99)
Glucose-Capillary: 139 mg/dL — ABNORMAL HIGH (ref 70–99)

## 2022-07-05 MED ORDER — ALBUMIN HUMAN 25 % IV SOLN
25.0000 g | Freq: Four times a day (QID) | INTRAVENOUS | Status: AC
  Administered 2022-07-05 – 2022-07-06 (×6): 25 g via INTRAVENOUS
  Filled 2022-07-05 (×6): qty 100

## 2022-07-05 NOTE — Consult Note (Signed)
Gastroenterology Consult   Referring Provider: No ref. provider found Primary Care Physician:  [facility] Primary Gastroenterologist:  Dr. Levon Hedger  Patient ID: Daniel Crawford; [id number]; 08-26-1955   Admit date: 07/04/2022  LOS: 1 day   Date of Consultation: 07/05/2022  Reason for Consultation:  decompensated liver failure    History of Present Illness   Daniel Crawford is a 67 y.o. male with history of decompensated Elita Boone cirrhosis with hepatic encephalopathy and anasarca, currently not a TIPS candidate (seen by IR 01/2022 and felt no indication for TIPs and would likely worsen pulmonary HTN and anasarca felt to be multifactorial from cardiac/renal/hepatic etiologies) or a liver transplant candidate (previously evaluated at [facility] liver transplant clinic 2021 and not felt to be a candidate due to low MELD at that time and BMI >40), chronically elevated AFP, diabetes, hypertension, hypothyroidism, diastolic heart failure, h/o cecal colon polyps (not removed due to difficult approach needed repeat colonoscopy) presenting with edema and shortness of breath.   Last seen in the office in May 17, 2022.  At that time weight 400.2 pounds, up from 383 in January, up from 365 in December.  Diuretic regimen had recently been changed to torsemide 40 mg (patient takes two 20mg  at same time), Aldactone 100 mg, followed by nephrology.  Had marked edema to the thighs on exam.  Patient declined going to the ED wanting to give medication more time to help. Ultimately patient was admitted from February 26 through March 1 for anasarca.  Discharged home on torsemide 40 mg twice daily (although patient states he thought it was only once daily), spironolactone 100 mg daily, metolazone weekly.  Discharge weight 376 pounds.  Today: Patient reports compliance with his diuretics although he thought he was supposed to take torsemide only once daily. He admits that he only took metolazone once and he  had memory issues for three days, was confused and was scared to take it again. He states he restricts his fluids. Typically has three 16 ounce waters and 8 ounce fresca daily. Occasionally limited juice. States he is following 2 g sodium diet. Complained of dyspnea which was progressive. Also with orthopnea.  Denied abdominal pain, nausea/vomiting, fever. States every time he goes home from hospital, his fluid builds back up in about 4 weeks. Two weeks ago, he states his lower legs looked normal. He reports urinating 8-12 pounds of fluid daily. He has 3-4 stools per day. No melena, brbpr. Heartburn well controlled.   In the ED: White blood cell count 5600, hemoglobin 11.9, hematocrit 36.4, platelets 85,000, MCV 90.3, sodium 133, creatinine 1.35 at baseline, total bilirubin 4.3, alkaline phosphatase 111, AST 48, ALT 19, albumin 3.0, BNP 47.  Chest x-ray with no active disease.  Today: Sodium 134, BUN 13, creatinine 1.35, hemoglobin 10.3, platelets 74,000, total bilirubin 3.8, alkaline phosphatase 91, AST 37, ALT 17, albumin 2.5   Last EGD: 11/06/2019 - Normal esophagus. Dilated. - Portal hypertensive gastropathy. - Normal examined duodenum. - No specimens collected.  Last Colonoscopy:06/28/2021  - One medium size sessile polyp in the cecum. This polyp could not be removed because of poor approach. - Five 4 to 7 mm polyps in the transverse colon and at the hepatic flexure, removed with a cold snare. Resected and retrieved. - External and internal hemorrhoids.   Path: All polyps were tubular adenomas   Prior to Admission medications   Medication Sig Start Date End Date Taking? Authorizing Provider  acetaminophen (TYLENOL) 500 MG tablet Take 1,000  mg by mouth every 6 (six) hours as needed for moderate pain.   Yes [provider]  albuterol (VENTOLIN HFA) 108 (90 Base) MCG/ACT inhaler Inhale 2 puffs into the lungs every 6 (six) hours as needed for wheezing or shortness of breath. 05/01/22   Yes Oretha Milch, MD  carvedilol (COREG) 6.25 MG tablet Take 1 tablet (6.25 mg total) by mouth 2 (two) times daily with a meal. 01/25/22  Yes Emokpae, Courage, MD  gabapentin (NEURONTIN) 100 MG capsule Take 100 mg by mouth daily as needed (pain). 02/20/21  Yes [provider]  hydrOXYzine (VISTARIL) 50 MG capsule Take 1 capsule (50 mg total) by mouth 3 (three) times daily as needed for anxiety or nausea. 01/25/22  Yes Emokpae, Courage, MD  lactose free nutrition (BOOST) LIQD Take 237 mLs by mouth every evening.   Yes [provider]  levothyroxine (SYNTHROID) 175 MCG tablet Take 175 mcg by mouth daily before breakfast.   Yes [provider]  metolazone (ZAROXOLYN) 2.5 MG tablet Take 1 tablet (2.5 mg total) by mouth once a week. 05/30/22  Yes Vassie Loll, MD  ondansetron (ZOFRAN) 8 MG tablet Take by mouth every 8 (eight) hours as needed for nausea or vomiting.   Yes [provider]  pantoprazole (PROTONIX) 40 MG tablet Take 1 tablet (40 mg total) by mouth daily. 01/25/22  Yes Emokpae, Courage, MD  potassium chloride SA (KLOR-CON M) 20 MEQ tablet Take 2 tablets (40 mEq total) by mouth daily. Patient taking differently: Take 40 mEq by mouth 2 (two) times daily. 03/05/22  Yes Johnson, Clanford L, MD  rifaximin (XIFAXAN) 550 MG TABS tablet Take 1 tablet (550 mg total) by mouth 2 (two) times daily. 01/25/22  Yes Emokpae, Courage, MD  sertraline (ZOLOFT) 50 MG tablet Take 1 tablet (50 mg total) by mouth at bedtime. 01/25/22  Yes Emokpae, Courage, MD  spironolactone (ALDACTONE) 100 MG tablet Take 1 tablet (100 mg total) by mouth daily. 03/04/22  Yes Johnson, Clanford L, MD  torsemide 40 MG TABS Take 40 mg by mouth 2 (two) times daily. Patient taking differently: Take 40 mg by mouth daily. 04/10/22  Yes Tat, Onalee Hua, MD  traZODone (DESYREL) 50 MG tablet Take 1 tablet (50 mg total) by mouth at bedtime. 01/25/22  Yes Shon Hale, MD        [provider]         [provider]    Current Facility-Administered Medications  Medication Dose Route Frequency Provider Last Rate Last Admin   albuterol (PROVENTIL) (2.5 MG/3ML) 0.083% nebulizer solution 2.5 mg  2.5 mg Nebulization Q2H PRN Elgergawy, Leana Roe, MD       carvedilol (COREG) tablet 6.25 mg  6.25 mg Oral BID WC Elgergawy, Leana Roe, MD       docusate sodium (COLACE) capsule 100 mg  100 mg Oral BID Elgergawy, Leana Roe, MD   100 mg at 07/04/22 2140   furosemide (LASIX) injection 60 mg  60 mg Intravenous Q12H Elgergawy, Leana Roe, MD       gabapentin (NEURONTIN) capsule 100 mg  100 mg Oral Daily PRN Elgergawy, Leana Roe, MD   100 mg at 07/05/22 0357   hydrALAZINE (APRESOLINE) injection 5 mg  5 mg Intravenous Q4H PRN Elgergawy, Leana Roe, MD       lactose free nutrition (Boost) liquid 237 mL  237 mL Oral QPM Elgergawy, Leana Roe, MD       levothyroxine (SYNTHROID) tablet 175 mcg  175 mcg  Oral QAC breakfast Elgergawy, Leana Roeawood S, MD       ondansetron (ZOFRAN) tablet 4 mg  4 mg Oral Q6H PRN Elgergawy, Leana Roeawood S, MD       Or   ondansetron (ZOFRAN) injection 4 mg  4 mg Intravenous Q6H PRN Elgergawy, Leana Roeawood S, MD       pantoprazole (PROTONIX) EC tablet 40 mg  40 mg Oral Daily Elgergawy, Leana Roeawood S, MD   40 mg at 07/04/22 2003   potassium chloride SA (KLOR-CON M) CR tablet 40 mEq  40 mEq Oral Daily Elgergawy, Leana Roeawood S, MD   40 mEq at 07/04/22 2003   rifaximin (XIFAXAN) tablet 550 mg  550 mg Oral BID Elgergawy, Leana Roeawood S, MD   550 mg at 07/04/22 2140   spironolactone (ALDACTONE) tablet 100 mg  100 mg Oral Daily Elgergawy, Leana Roeawood S, MD   100 mg at 07/04/22 2003    Allergies as of 07/04/2022   (No Known Allergies)    Past Medical History:  Diagnosis Date   Anemia    Anxiety    Asthma    CHF (congestive heart failure)    CKD (chronic kidney disease)    Depression    Diabetes mellitus without complication    Dyspnea    Hypertension    Hypothyroidism    Liver cirrhosis secondary to NASH    NASH  (nonalcoholic steatohepatitis)    Pre-diabetes    Spleen enlarged    Thrombocytopenia     Past Surgical History:  Procedure Laterality Date   Bilateral hernia surgery     2006, 2007   CHOLECYSTECTOMY     2016    COLONOSCOPY WITH PROPOFOL N/A 06/28/2021   Procedure: COLONOSCOPY WITH PROPOFOL;  Surgeon: Malissa Hippoehman, Najeeb U, MD;  Location: [facility];  Service: Endoscopy;  Laterality: N/A;  1020   ESOPHAGEAL DILATION N/A 11/06/2019   Procedure: ESOPHAGEAL DILATION;  Surgeon: Dolores Frameastaneda Mayorga, Daniel, MD;  Location: [facility];  Service: Gastroenterology;  Laterality: N/A;   ESOPHAGOGASTRODUODENOSCOPY (EGD) WITH PROPOFOL N/A 11/06/2019   Procedure: ESOPHAGOGASTRODUODENOSCOPY (EGD) WITH PROPOFOL;  Surgeon: Dolores Frameastaneda Mayorga, Daniel, MD;  Location: [facility];  Service: Gastroenterology;  Laterality: N/A;  1045   IR RADIOLOGIST EVAL & MGMT  02/19/2022   LAPAROSCOPIC ASSISTED VENTRAL HERNIA REPAIR     Polyp removed     in January of 2018.    POLYPECTOMY  06/28/2021   Procedure: POLYPECTOMY INTESTINAL;  Surgeon: Malissa Hippoehman, Najeeb U, MD;  Location: [facility];  Service: Endoscopy;;   RIGHT HEART CATH N/A 01/08/2022   Procedure: RIGHT HEART CATH;  Surgeon: Corky CraftsVaranasi, Jayadeep S, MD;  [facility];  Service: Cardiovascular;  Laterality: N/A;    Family History  Problem Relation Age of Onset   Liver cancer Mother    Heart disease Mother    Aneurysm Sister     Social History   Socioeconomic History   Marital status: Single    Spouse name: Not on file   Number of children: Not on file   Years of education: Not on file   Highest education level: Not on file  Occupational History   Not on file  Tobacco Use   Smoking status: Former    Packs/day: 1.00    Years: 20.00    Additional pack years: 0.00    Total pack years: 20.00    Types: Cigarettes    Quit date: 2017    Years since quitting: 7.2   Smokeless tobacco: Never  Vaping  Use   Vaping Use: Never used   Substance and Sexual Activity   Alcohol use: No   Drug use: No   Sexual activity: Not on file  Other Topics Concern   Not on file  Social History Narrative   Not on file   Social Determinants of Health   Financial Resource Strain: Not on file  Food Insecurity: No Food Insecurity (07/04/2022)   Hunger Vital Sign    Worried About Running Out of Food in the Last Year: Never true    Ran Out of Food in the Last Year: Never true  Transportation Needs: No Transportation Needs (07/04/2022)   PRAPARE - Administrator, Civil Service (Medical): No    Lack of Transportation (Non-Medical): No  Physical Activity: Not on file  Stress: Not on file  Social Connections: Not on file  Intimate Partner Violence: Not At Risk (07/04/2022)   Humiliation, Afraid, Rape, and Kick questionnaire    Fear of Current or Ex-Partner: No    Emotionally Abused: No    Physically Abused: No    Sexually Abused: No     Review of System:   General: Negative for anorexia, weight loss, fever, chills, +fatigue, +weakness. Eyes: Negative for vision changes.  ENT: Negative for hoarseness, difficulty swallowing , nasal congestion. CV: Negative for chest pain, angina, palpitations, +dyspnea on exertion, peripheral edema.  Respiratory: Negative for dyspnea at rest, dyspnea on exertion, cough, sputum, wheezing.  GI: See history of present illness. GU:  Negative for dysuria, hematuria, urinary incontinence, urinary frequency, nocturnal urination.  MS: Negative for joint pain, low back pain.  Derm: Negative for rash or itching.  Neuro: Negative for weakness, abnormal sensation, seizure, frequent headaches, memory loss, confusion.  Psych: Negative for anxiety, depression, suicidal ideation, hallucinations.  Endo: Negative for unusual weight change.  Heme: Negative for bruising or bleeding. Allergy: Negative for rash or hives.      Physical Examination:   Vital signs in last 24 hours: Temp:  [98 F (36.7  C)-99.9 F (37.7 C)] 98 F (36.7 C) (04/11 0622) Pulse Rate:  [70-82] 71 (04/11 0622) Resp:  [11-22] 18 (04/11 0622) BP: (127-166)/(61-94) 127/61 (04/11 0622) SpO2:  [96 %-100 %] 98 % (04/11 0622) Weight:  [183.8 kg-185.5 kg] 183.8 kg (04/11 0314) Last BM Date : 07/04/22  General: Well-nourished, well-developed in no acute distress.  Head: Normocephalic, atraumatic.   Eyes: Conjunctiva pink, no icterus. Mouth: Oropharyngeal mucosa moist and pink , no lesions erythema or exudate. Neck: Supple without thyromegaly, masses, or lymphadenopathy.  Lungs: Clear to auscultation bilaterally.  Heart: Regular rate and rhythm, no murmurs rubs or gallops.  Abdomen: Bowel sounds are normal, nontender, no hepatosplenomegaly or masses, no abdominal bruits.umb hernia present, nontender, no rebound or guarding.  Exam limited by body habitus. 2+ pitting edema in dependent abdomen (flanks/suprapubic area). Rectal: not performed Extremities: 3+ pitting edema to the knees bilaterally. No clubbing, deformity.  Neuro: Alert and oriented x 4 , grossly normal neurologically.  Skin: Warm and dry, no rash or jaundice.   Psych: Alert and cooperative, normal mood and affect.        Intake/Output from previous day: 04/10 0701 - 04/11 0700 In: 480 [P.O.:480] Out: 1750 [Urine:1750] Intake/Output this shift: No intake/output data recorded.  Lab Results:   CBC Recent Labs    07/04/22 1734 07/05/22 0451  WBC 5.6 4.1  HGB 11.9* 10.3*  HCT 36.4* 31.6*  MCV 90.3 89.0  PLT 85* 74*   BMET Recent  Labs    07/04/22 1734 07/05/22 0451  NA 133* 134*  K 3.6 3.6  CL 95* 100  CO2 27 31  GLUCOSE 148* 117*  BUN 13 13  CREATININE 1.35* 1.35*  CALCIUM 8.2* 8.1*   LFT Recent Labs    07/04/22 1734 07/05/22 0451  BILITOT 4.3* 3.8*  BILIDIR  --  1.0*  IBILI  --  2.8*  ALKPHOS 111 91  AST 48* 37  ALT 19 17  PROT 7.0 5.9*  ALBUMIN 3.0* 2.5*    Lipase No results for input(s): "LIPASE" in the last 72  hours.  PT/INR No results for input(s): "LABPROT", "INR" in the last 72 hours.   Hepatitis Panel No results for input(s): "HEPBSAG", "HCVAB", "HEPAIGM", "HEPBIGM" in the last 72 hours.   Imaging Studies:   DG Chest Port 1 View  Result Date: 07/04/2022 CLINICAL DATA:  Shortness of breath with bilateral leg swelling and abdominal swelling. EXAM: PORTABLE CHEST 1 VIEW COMPARISON:  May 21, 2022 FINDINGS: The heart size and mediastinal contours are within normal limits. Both lungs are clear. The visualized skeletal structures are unremarkable. IMPRESSION: No active disease. Electronically Signed   By: Aram Candela M.D.   On: 07/04/2022 17:56  [4 week]  Assessment:   Pleasant 67 year old male with past medical history significant for type 2 diabetes mellitus, hypertension, hypothyroidism, diastolic heart failure, decompensated Nash cirrhosis with hepatic encephalopathy and anasarca,chronically elevated AFP, h/o cecal colon polyps (not removed due to difficult approach needed repeat colonoscopy) presenting with edema and shortness of breath.  Anasarca: Recurrent admissions over the past 5 months, this is his fifth admission.  Diuretic regimen has been challenging in the setting of climbing creatinine.  Anasarca felt to be multifactorial likely cardiac/renal/hepatic in nature.  Patient reports fluid restriction, 2 g sodium diet.  Outpatient diuretic regimen, currently he is taking torsemide 40 mg once daily (did not realize he was supposed to be on twice daily), spironolactone 100 mg daily.  He took metolazone only once as an outpatient because he felt like it caused memory issues/confusion.  He reports urine output daily anywhere from 8 to 12 pounds, measures in a pitcher at home.  He has never required paracentesis.   Decompensated cirrhosis: Likely contributing to his anasarca.  Albumin 2.5.  Previously evaluated for TIPS but IR did not feel like it was indicated, given anasarca likely  multifactorial and TIPS could potentially worsen pulmonary hypertension.  Patient previously evaluated in 2021 at [facility] liver transplant, not a candidate at that time due to low MELD score and BMI greater than 40.  Patient's current BMI is 48 using his lowest weight after diuresis (375 pounds).  Patient questions possibility of using injectable weight loss management medication such as semaglutide.  Recent confusion which he contributed to metolazone but could have been coincidental.  No evidence of hepatic encephalopathy at this time.  Colon polyp: Colonoscopy April 2023, cecal colon polyp has not been removed, difficult approach previously.  Plan for colonoscopy when he is more euvolemic.  Plan:   Two gram sodium diet. Daily weights.  IV albumin. Consider nephrology consultation to assist with diuretic regimen. Labs for MELD score. Outpatient EGD and colonoscopy once more euvolemic.   LOS: 1 day   We would like to thank you for the opportunity to participate in the care of Daniel Crawford.  Leanna Battles. [facility] [phone] 4/11/20248:08 AM

## 2022-07-05 NOTE — Progress Notes (Signed)
PROGRESS NOTE    Daniel Crawford  FTD:[id number] DOB: 1955-11-02 DOA: 07/04/2022 PCP: [facility]  Chief Complaint  Patient presents with   Shortness of Breath   Leg Swelling    Brief Narrative:   Daniel Crawford  is Daniel Crawford 67 y.o. male, with Lashante Fryberger history of diabetes mellitus type 2, hypertension, hypothyroidism, NASH liver cirrhosis who presents with volume overload and anasarca.  Assessment & Plan:   Principal Problem:   Decompensated liver disease Active Problems:   Acquired hypothyroidism   Cirrhosis of liver with ascites   Thrombocytopenia   OSA (obstructive sleep apnea)   Anasarca   Liver cirrhosis secondary to NASH   Acute exacerbation of CHF (congestive heart failure)   Morbid obesity  Decompensated liver cirrhosis with anasarca -Patient presents with significant evidence of volume overload, presents with anasarca . -Reports he has been compliant with fluid restriction, low-salt diet and Aldactone with torsemide, but has not been taking his Zaroxolyn . -Weight on recent discharge month was 171 kg, it is 185 kg this admission. -continue BID lasix  -continue spironolactone -diuretics will probably need to be uptitrated as tolerated -watch creatinine while diuresing -Continue with home Xifaxan - [place] ascites search, would perform para if safe pocket -Will consult gastroenterology service.   Shortness of breath - due to above, follow  Thrombocytopenia -Due to liver cirrhosis, continue with SCD for DVT prophylaxis   Diabetes mellitus type 2 A1c 5.1 03/2022 Follow off meds   Essential hypertension - home carvedilol, spironolactone, diuresis -Heart healthy/low-sodium diet discussed with patient.   Hypothyroidism -Continue with home Synthroid   Depression/anxiety -Will hold Zoloft and trazodone due to prolonged QTc. -510 today, repeat in AM -follow mag/k   CKD 3a -Renal function at baseline, monitor closely in the setting of diuresis     Morbid  obesity .BMI   Prolonged QTc -510 -repeat EKG in Am - mag/k     DVT prophylaxis: SCD Code Status: full Family Communication: none Disposition:   Status is: Inpatient Remains inpatient appropriate because: need for diuresis   Consultants:  GI  Procedures:  none  Antimicrobials:  Anti-infectives (From admission, onward)    Start     Dose/Rate Route Frequency Ordered Stop   07/04/22 2200  rifaximin (XIFAXAN) tablet 550 mg        550 mg Oral 2 times daily 07/04/22 1935         Subjective: Says this happens every 6 weeks or so, he has to come in and get more aggressively diuresed  Objective: Vitals:   07/04/22 2223 07/05/22 0314 07/05/22 0622 07/05/22 1300  BP: 138/68 129/70 127/61 125/73  Pulse: 75 73 71 66  Resp: 18 18 18    Temp: 99.9 F (37.7 C) 98.3 F (36.8 C) 98 F (36.7 C) 97.8 F (36.6 C)  TempSrc: Oral Oral Oral Oral  SpO2: 100% 99% 98% 99%  Weight:  (!) 183.8 kg      Intake/Output Summary (Last 24 hours) at 07/05/2022 1459 Last data filed at 07/05/2022 1300 Gross per 24 hour  Intake 960 ml  Output 3550 ml  Net -2590 ml   Filed Weights   07/04/22 1729 07/05/22 0314  Weight: (!) 185.5 kg (!) 183.8 kg    Examination:  General exam: Appears uncomfortable Respiratory system: audible wheezing Cardiovascular system: RRR Gastrointestinal system: abdominal distension, not tender Central nervous system: Alert and oriented. No focal neurological deficits. Extremities: bilateral LE edema   Data Reviewed: I have personally reviewed following labs  and imaging studies  CBC: Recent Labs  Lab 07/04/22 1734 07/05/22 0451  WBC 5.6 4.1  NEUTROABS 4.0  --   HGB 11.9* 10.3*  HCT 36.4* 31.6*  MCV 90.3 89.0  PLT 85* 74*    Basic Metabolic Panel: Recent Labs  Lab 07/04/22 1734 07/05/22 0451  NA 133* 134*  K 3.6 3.6  CL 95* 100  CO2 27 31  GLUCOSE 148* 117*  BUN 13 13  CREATININE 1.35* 1.35*  CALCIUM 8.2* 8.1*  MG 2.0  --      GFR: Estimated Creatinine Clearance: 96 mL/min (Ashima Shrake) (by C-G formula based on SCr of 1.35 mg/dL (H)).  Liver Function Tests: Recent Labs  Lab 07/04/22 1734 07/05/22 0451  AST 48* 37  ALT 19 17  ALKPHOS 111 91  BILITOT 4.3* 3.8*  PROT 7.0 5.9*  ALBUMIN 3.0* 2.5*    CBG: Recent Labs  Lab 07/04/22 2146 07/05/22 0739 07/05/22 1116  GLUCAP 130* 116* 139*     No results found for this or any previous visit (from the past 240 hour(s)).       Radiology Studies: DG Chest Port 1 View  Result Date: 07/04/2022 CLINICAL DATA:  Shortness of breath with bilateral leg swelling and abdominal swelling. EXAM: PORTABLE CHEST 1 VIEW COMPARISON:  May 21, 2022 FINDINGS: The heart size and mediastinal contours are within normal limits. Both lungs are clear. The visualized skeletal structures are unremarkable. IMPRESSION: No active disease. Electronically Signed   By: Aram Candela M.D.   On: 07/04/2022 17:56        Scheduled Meds:  carvedilol  6.25 mg Oral BID WC   docusate sodium  100 mg Oral BID   furosemide  60 mg Intravenous Q12H   lactose free nutrition  237 mL Oral QPM   levothyroxine  175 mcg Oral QAC breakfast   pantoprazole  40 mg Oral Daily   potassium chloride SA  40 mEq Oral Daily   rifaximin  550 mg Oral BID   spironolactone  100 mg Oral Daily   Continuous Infusions:  albumin human 25 g (07/05/22 1359)     LOS: 1 day    Time spent: over 30 min    Lacretia Nicks, MD [facility]   To contact the attending provider between 7A-7P or the covering provider during after hours 7P-7A, please log into the web site [email or web address] and access using universal [place] password for that web site. If you do not have the password, please call the hospital operator.  07/05/2022, 2:59 PM

## 2022-07-05 NOTE — TOC Initial Note (Signed)
Transition of Care [facility]) - Initial/Assessment Note    Patient Details  Name: Daniel Crawford MRN: [id number] Date of Birth: [birth year]  Transition of Care [facility]) CM/SW Contact:    Villa Herb, LCSWA Phone Number: 07/05/2022, 12:16 PM  Clinical Narrative:                 Pt is high risk for readmission. Pt is well known to TOC. Pt arrives from home where he lives alone. Pt is independent in completing ADLs and able to provide his own transportation when needed. Pt was recently referred to [facility] Palliative services. TOC to follow for D/C needs.   Expected Discharge Plan: Home/Self Care Barriers to Discharge: Continued Medical Work up   Patient Goals and CMS Choice Patient states their goals for this hospitalization and ongoing recovery are:: reutrn home CMS Medicare.gov Compare Post Acute Care list provided to:: Patient Choice offered to / list presented to : Patient      Expected Discharge Plan and Services In-house Referral: Clinical Social Work Discharge Planning Services: CM Consult   Living arrangements for the past 2 months: Single Family Home                                      Prior Living Arrangements/Services Living arrangements for the past 2 months: Single Family Home Lives with:: Self Patient language and need for interpreter reviewed:: Yes Do you feel safe going back to the place where you live?: Yes      Need for Family Participation in Patient Care: Yes (Comment) Care giver support system in place?: Yes (comment)   Criminal Activity/Legal Involvement Pertinent to Current Situation/Hospitalization: No - Comment as needed  Activities of Daily Living Home Assistive Devices/Equipment: None ADL Screening (condition at time of admission) Patient's cognitive ability adequate to safely complete daily activities?: Yes Is the patient deaf or have difficulty hearing?: No Does the patient have difficulty seeing, even when wearing glasses/contacts?: No Does  the patient have difficulty concentrating, remembering, or making decisions?: No Patient able to express need for assistance with ADLs?: Yes Does the patient have difficulty dressing or bathing?: No Independently performs ADLs?: Yes (appropriate for developmental age) Does the patient have difficulty walking or climbing stairs?: No Weakness of Legs: None Weakness of Arms/Hands: None  Permission Sought/Granted                  Emotional Assessment Appearance:: Appears stated age Attitude/Demeanor/Rapport: Engaged Affect (typically observed): Accepting Orientation: : Oriented to Self, Oriented to Place, Oriented to  Time, Oriented to Situation Alcohol / Substance Use: Not Applicable Psych Involvement: No (comment)  Admission diagnosis:  Anasarca [R60.1] Decompensated liver disease [K74.69] Patient Active Problem List   Diagnosis Date Noted   Decompensated liver disease 07/04/2022   Morbid obesity 05/25/2022   Decompensation of cirrhosis of liver 05/21/2022   Pancytopenia 05/21/2022   Acute exacerbation of CHF (congestive heart failure) 04/06/2022   Liver cirrhosis secondary to NASH 03/13/2022   Iron deficiency anemia 02/28/2022   Hypoalbuminemia due to protein-calorie malnutrition 02/28/2022   GERD (gastroesophageal reflux disease) 02/28/2022   Abdominal pain 02/28/2022   Anasarca 01/23/2022   Generalized weakness 01/23/2022   Essential hypertension 01/23/2022   Volume overload 01/23/2022   Hypervolemia    Elevated TSH 01/22/2022   D-dimer, elevated 01/22/2022   Protein calorie malnutrition 01/22/2022   Chronic diastolic heart failure    Hypogonadism, male  12/26/2021   History of colonic polyps 07/27/2021   Visual hallucination 07/27/2021   Auditory hallucinations 07/27/2021   Leukopenia 04/19/2021   Hypokalemia 04/18/2021   Dyspnea on exertion 01/20/2020   OSA (obstructive sleep apnea) 01/20/2020   History of ascites 10/22/2019   Acquired hypothyroidism  10/16/2016   Cirrhosis of liver with ascites 10/16/2016   Depression with anxiety 10/16/2016   Thrombocytopenia 10/16/2016   Spleen enlarged 10/16/2016   PCP:  [facility] Pharmacy:   [id number] - [place], [place] - [address] AT [facility] [address] [place] [address] Phone: [phone] Fax: [phone]     Social Determinants of Health (SDOH) Social History: SDOH Screenings   Food Insecurity: No Food Insecurity (07/04/2022)  Housing: Low Risk  (07/04/2022)  Transportation Needs: No Transportation Needs (07/04/2022)  Utilities: Not At Risk (07/04/2022)  Depression (PHQ2-9): Medium Risk (10/27/2019)  Tobacco Use: Medium Risk (07/04/2022)   SDOH Interventions:     Readmission Risk Interventions    07/05/2022   12:16 PM 05/22/2022   10:18 AM  Readmission Risk Prevention Plan  Transportation Screening Complete Complete  Medication Review (RN Care Manager) Complete Complete  HRI or Home Care Consult Complete Complete  SW Recovery Care/Counseling Consult Complete Complete  Palliative Care Screening Not Applicable Not Applicable  Skilled Nursing Facility Not Applicable Not Applicable

## 2022-07-06 ENCOUNTER — Encounter (HOSPITAL_COMMUNITY): Payer: Medicare HMO | Admitting: Cardiology

## 2022-07-06 DIAGNOSIS — I509 Heart failure, unspecified: Secondary | ICD-10-CM | POA: Diagnosis not present

## 2022-07-06 DIAGNOSIS — K7460 Unspecified cirrhosis of liver: Principal | ICD-10-CM

## 2022-07-06 DIAGNOSIS — K7469 Other cirrhosis of liver: Secondary | ICD-10-CM | POA: Diagnosis not present

## 2022-07-06 DIAGNOSIS — E119 Type 2 diabetes mellitus without complications: Secondary | ICD-10-CM

## 2022-07-06 DIAGNOSIS — I10 Essential (primary) hypertension: Secondary | ICD-10-CM

## 2022-07-06 DIAGNOSIS — R601 Generalized edema: Secondary | ICD-10-CM | POA: Diagnosis not present

## 2022-07-06 DIAGNOSIS — K7581 Nonalcoholic steatohepatitis (NASH): Secondary | ICD-10-CM

## 2022-07-06 DIAGNOSIS — N183 Chronic kidney disease, stage 3 unspecified: Secondary | ICD-10-CM | POA: Insufficient documentation

## 2022-07-06 DIAGNOSIS — N1831 Chronic kidney disease, stage 3a: Secondary | ICD-10-CM

## 2022-07-06 LAB — COMPREHENSIVE METABOLIC PANEL
ALT: 17 U/L (ref 0–44)
AST: 37 U/L (ref 15–41)
Albumin: 3.2 g/dL — ABNORMAL LOW (ref 3.5–5.0)
Alkaline Phosphatase: 89 U/L (ref 38–126)
Anion gap: 4 — ABNORMAL LOW (ref 5–15)
BUN: 13 mg/dL (ref 8–23)
CO2: 30 mmol/L (ref 22–32)
Calcium: 8.4 mg/dL — ABNORMAL LOW (ref 8.9–10.3)
Chloride: 100 mmol/L (ref 98–111)
Creatinine, Ser: 1.25 mg/dL — ABNORMAL HIGH (ref 0.61–1.24)
GFR, Estimated: >60 mL/min (ref >60)
Glucose, Bld: 104 mg/dL — ABNORMAL HIGH (ref 70–99)
Potassium: 3.6 mmol/L (ref 3.5–5.1)
Sodium: 134 mmol/L — ABNORMAL LOW (ref 135–145)
Total Bilirubin: 3.7 mg/dL — ABNORMAL HIGH (ref 0.3–1.2)
Total Protein: 6.5 g/dL (ref 6.5–8.1)

## 2022-07-06 LAB — PROTIME-INR
INR: 1.6 — ABNORMAL HIGH (ref 0.8–1.2)
Prothrombin Time: 19 seconds — ABNORMAL HIGH (ref 11.4–15.2)

## 2022-07-06 LAB — CBC WITH DIFFERENTIAL/PLATELET
Abs Immature Granulocytes: 0.01 10*3/uL (ref 0.00–0.07)
Basophils Absolute: 0 10*3/uL (ref 0.0–0.1)
Basophils Relative: 1 %
Eosinophils Absolute: 0.3 10*3/uL (ref 0.0–0.5)
Eosinophils Relative: 9 %
HCT: 33.4 % — ABNORMAL LOW (ref 39.0–52.0)
Hemoglobin: 10.6 g/dL — ABNORMAL LOW (ref 13.0–17.0)
Immature Granulocytes: 0 %
Lymphocytes Relative: 23 %
Lymphs Abs: 0.7 10*3/uL (ref 0.7–4.0)
MCH: 28.8 pg (ref 26.0–34.0)
MCHC: 31.7 g/dL (ref 30.0–36.0)
MCV: 90.8 fL (ref 80.0–100.0)
Monocytes Absolute: 0.3 10*3/uL (ref 0.1–1.0)
Monocytes Relative: 11 %
Neutro Abs: 1.7 10*3/uL (ref 1.7–7.7)
Neutrophils Relative %: 56 %
Platelets: 69 10*3/uL — ABNORMAL LOW (ref 150–400)
RBC: 3.68 MIL/uL — ABNORMAL LOW (ref 4.22–5.81)
RDW: 16.7 % — ABNORMAL HIGH (ref 11.5–15.5)
WBC: 3.1 10*3/uL — ABNORMAL LOW (ref 4.0–10.5)
nRBC: 0 % (ref 0.0–0.2)

## 2022-07-06 MED ORDER — ALPRAZOLAM 0.5 MG PO TABS
0.5000 mg | ORAL_TABLET | Freq: Once | ORAL | Status: AC
  Administered 2022-07-06: 0.5 mg via ORAL
  Filled 2022-07-06: qty 1

## 2022-07-06 MED ORDER — OXYCODONE HCL 5 MG PO TABS
5.0000 mg | ORAL_TABLET | Freq: Four times a day (QID) | ORAL | Status: DC | PRN
  Administered 2022-07-07: 5 mg via ORAL
  Filled 2022-07-06: qty 1

## 2022-07-06 MED ORDER — SPIRONOLACTONE 25 MG PO TABS
150.0000 mg | ORAL_TABLET | Freq: Every day | ORAL | Status: DC
  Administered 2022-07-06 – 2022-07-08 (×3): 150 mg via ORAL
  Filled 2022-07-06 (×3): qty 6

## 2022-07-06 MED ORDER — OXYCODONE HCL 5 MG PO TABS
5.0000 mg | ORAL_TABLET | Freq: Once | ORAL | Status: AC
  Administered 2022-07-06: 5 mg via ORAL
  Filled 2022-07-06: qty 1

## 2022-07-06 MED ORDER — ALPRAZOLAM 0.5 MG PO TABS: 0.5000 mg | ORAL_TABLET | Freq: Three times a day (TID) | ORAL | Status: DC | PRN

## 2022-07-06 NOTE — Progress Notes (Signed)
Gastroenterology Progress Note   Referring Provider: No ref. provider found Primary Care Physician:  [facility] Primary Gastroenterologist:  Dolores Frame, MD  Patient ID: Daniel Crawford [id number]; 03-31-1955    Subjective   Feeling improved from respiratory standpoint.  Denies any shortness of breath or chest pain.  Feels as though his lower extremity swelling is improved from prior.  Has a good appetite.  No confusion, abdominal pain, nausea or vomiting.  Tolerating diet.  Patient tearful today on exam as he has received some bad news regarding his dog who is being boarded while he is in the hospital.  His dog is showing signs of sickness and fears that he Barman need to be put down.  Objective   Vital signs in last 24 hours Temp:  [97.8 F (36.6 C)-98.2 F (36.8 C)] 98.2 F (36.8 C) (04/12 0437) Pulse Rate:  [66-70] 70 (04/12 0437) Resp:  [18] 18 (04/12 0437) BP: (125-134)/(56-73) 131/69 (04/12 0437) SpO2:  [93 %-99 %] 93 % (04/12 0437) Weight:  [179 kg] 179 kg (04/12 0500) Last BM Date : 07/05/22  Physical Exam General:   Alert and oriented, pleasant. tearful Head:  Normocephalic and atraumatic. Eyes:  No icterus, sclera clear. Conjuctiva pink.  Mouth:  Without lesions, mucosa pink and moist.  Neck:  Supple, without thyromegaly or masses.  Heart:  S1, S2 present, no murmurs noted.  Lungs: Clear to auscultation bilaterally, without wheezing, rales, or rhonchi.  Abdomen: + 1 edema to dependent areas around pelvis and flank. Bowel sounds present, soft, non-tender, non-distended. No HSM. Medium sized umbilical hernia soft and reducible.  No rebound or guarding. No masses appreciated however limited by body habitus.   Msk:  Symmetrical without gross deformities. Normal posture. Extremities:  +2 pitting edema bilaterally extending to knees.  Neurologic:  Alert and  oriented x4;  grossly normal neurologically.  No asterixis Skin:  Warm and dry,  intact without significant lesions.  Psych:  Alert and cooperative. Tearful.  Intake/Output from previous day: 04/11 0701 - 04/12 0700 In: 1445.7 [P.O.:1200; IV Piggyback:245.7] Out: 5700 [Urine:5700] Intake/Output this shift: No intake/output data recorded.  Lab Results  Recent Labs    07/04/22 1734 07/05/22 0451 07/06/22 0507  WBC 5.6 4.1 3.1*  HGB 11.9* 10.3* 10.6*  HCT 36.4* 31.6* 33.4*  PLT 85* 74* 69*   BMET Recent Labs    07/04/22 1734 07/05/22 0451 07/06/22 0507  NA 133* 134* 134*  K 3.6 3.6 3.6  CL 95* 100 100  CO2 27 31 30   GLUCOSE 148* 117* 104*  BUN 13 13 13   CREATININE 1.35* 1.35* 1.25*  CALCIUM 8.2* 8.1* 8.4*   LFT Recent Labs    07/04/22 1734 07/05/22 0451 07/06/22 0507  PROT 7.0 5.9* 6.5  ALBUMIN 3.0* 2.5* 3.2*  AST 48* 37 37  ALT 19 17 17   ALKPHOS 111 91 89  BILITOT 4.3* 3.8* 3.7*  BILIDIR  --  1.0*  --   IBILI  --  2.8*  --    PT/INR Recent Labs    07/06/22 0507  LABPROT 19.0*  INR 1.6*   Hepatitis Panel No results for input(s): "HEPBSAG", "HCVAB", "HEPAIGM", "HEPBIGM" in the last 72 hours.  Studies/Results [place] ASCITES (ABDOMEN LIMITED)  Result Date: 07/05/2022 CLINICAL DATA:  History of cirrhosis EXAM: LIMITED ABDOMEN ULTRASOUND FOR ASCITES TECHNIQUE: Limited ultrasound survey for ascites was performed in all four abdominal quadrants. COMPARISON:  None Available. FINDINGS: All 4 quadrants skin for ascites, trace amount of  fluid noted. IMPRESSION: Trace ascites. Electronically Signed   By: Allegra Lai M.D.   On: 07/05/2022 17:15   DG Chest Port 1 View  Result Date: 07/04/2022 CLINICAL DATA:  Shortness of breath with bilateral leg swelling and abdominal swelling. EXAM: PORTABLE CHEST 1 VIEW COMPARISON:  May 21, 2022 FINDINGS: The heart size and mediastinal contours are within normal limits. Both lungs are clear. The visualized skeletal structures are unremarkable. IMPRESSION: No active disease. Electronically Signed   By:  Aram Candela M.D.   On: 07/04/2022 17:56    Assessment  67 y.o. male with a history of decompensated NASH cirrhosis with hepatic encephalopathy and anasarca, currently not a TIPS candidate(seen by IR 01/2022 and felt no indications for TIPS and would likely worsen pulmonary HTN and anasarca felt to be to multifactorial from cardiac/renal/hepatic etiologies) or a liver transplant candidate (previously evaluated at [facility] liver transplant clinic in 2021, not candidate secondary to low MELD at the time and BMI >40), chronically elevated AFP, history of cecal colon polyps (not related to 40 [place] continue to repeat colonoscopy) who presented to the hospital with edema and shortness of breath.  Anasarca: Fifth admission in the last 5 months secondary to challenging diuretic regimen in the setting of worsening renal function.  Etiology is likely multifactorial in the setting of cardiac/renal/hepatic comorbidities.  He reports he has been maintaining his fluid restriction and sodium diet at home.  Currently taking torsemide 40 mg once daily (unaware if he was to be taking this twice daily) and spironolactone 100 mg daily at home.  Previously did not tolerate metolazone secondary to memory issues/confusion.  Has never required paracentesis.  He is currently down 6.5 kg since admission.  On exam today it appears swelling is improved however still does have some +2 edema bilaterally.  He has received spironolactone 150 mg daily and Lasix 60 mg IV every 12 since admission.  Current weight 179kg (394.6lbs).  Decompensated cirrhosis: Albumin low at 2.5.  Liver disease likely contributing to anasarca.  Not a TIPS candidate given potential for worsening pulmonary hypertension.  Deemed to be not a transplant candidate by [facility] in 2021 secondary to low MELD and BMI greater than 40.  Most recently calculated BMI is 48 based off of lowest weight after diuresis.  Patient could possibly benefit from injectable  medication to help with weight loss outpatient.  On lactulose or Xifaxan, no evidence of any hepatic encephalopathy.  He is regularly followed by palliative care outpatient. MELD 3.0: 21  Colon polyp: Cecal polyp not removed on colonoscopy in 2023 due to difficult approach.  Plan has been to perform colonoscopy outpatient however has been unable to sustain euvolemia.   Plan / Recommendations  Continue current regimen of IV albumin today and reassess fluid status tomorrow Continue to appreciate nephrology's recommendations for diuretic regimen Daily weights, strict I's and O's 2 g sodium diet Outpatient EGD and colonoscopy    LOS: 2 days    07/06/2022, 8:58 AM   Brooke Bonito, MSN, FNP-BC, AGACNP-BC [facility]

## 2022-07-06 NOTE — Assessment & Plan Note (Addendum)
Hyponatremia.   Renal function with serum cr at 1,25, K is 3,6 and serum bicarbonate at 30 Na 134.   Plan to continue diuresis and follow up renal function and electrolytes in am.  Continue K supplementation to prevent hypokalemia

## 2022-07-06 NOTE — Assessment & Plan Note (Signed)
Decompensated liver failure, with anasarca.  Patient has been placed on IV furosemide and spironolactone.  Documented urine output today is 5,700 cc   Systolic blood pressure is 120 to 130 mmHg.  Abdominal US with only trace of ascites.   Plan to continue diuresis. Continue xifaxan.

## 2022-07-06 NOTE — Assessment & Plan Note (Signed)
Continue with levothyroxine  

## 2022-07-06 NOTE — Plan of Care (Signed)

## 2022-07-06 NOTE — Assessment & Plan Note (Addendum)
Calculated BMI is 50,6  Hip osteoarthritis, add oral oxycodone for pain control.

## 2022-07-06 NOTE — Assessment & Plan Note (Signed)
Continue blood pressure control with carvedilol.  

## 2022-07-06 NOTE — Progress Notes (Signed)
Referred by patient nurse and found patient in room. He was tearful today and welcomed chaplain bedside. He shared about how his dog passed this morning and how his illness has impacted his care and also the barriers he has experienced over the years due to waning health related to NASH. Chaplain provided compassionate presence, reflective listening, and prayer today. Patient was grateful for support. Will continue to remain available in order to provide spiritual support and to assess for spiritual need.   Rev. Jolyn Lent, M.Div Chaplain

## 2022-07-06 NOTE — Progress Notes (Signed)
   07/06/22 1800  ReDS Vest / Clip  Station Marker D  Ruler Value 45  ReDS Actual Value 28

## 2022-07-06 NOTE — Hospital Course (Addendum)
Daniel Crawford was admitted to the hospital with the working diagnosis of decompensated chronic liver disease.   67 yo male with the past medical history of T2DM, hypertension, hypothyroid and NASH who presented with lower extremity edema and dyspnea. At home on multiple diuretic therapy. Reported progressive orthopnea, fluid retention and edema at home. Positive weight gain 6 lbs since 05/25/22 from prior hospitalization. On his initial physical examination his blood pressure was 158/77, HR 82, RR 22 and 02 saturation 96%, luns with no wheezing or rales heart with S1 and s2 present and rhythmic, abdomen protuberant, positive lower extremity edema.   Patient has been placed on diuretic therapy with slow improvement in his symptoms.

## 2022-07-06 NOTE — Assessment & Plan Note (Signed)
2022 echocardiogram with preserved LV systolic function with EF 70 to 75%.  Plan to continue diuresis and blood pressure monitoring  Will repeat echocardiogram.  Ok to discontinue telemetry.

## 2022-07-06 NOTE — Assessment & Plan Note (Signed)
Continue close glucose monitoring. No hyperglycemia.  Hold on insulin therapy.

## 2022-07-06 NOTE — Care Management Important Message (Signed)
Important Message  Patient Details  Name: Daniel Crawford MRN: [id number] Date of Birth: 04-07-1955   Medicare Important Message Given:  Yes     [name] 07/06/2022, 9:46 AM

## 2022-07-06 NOTE — Progress Notes (Addendum)
  Progress Note   Patient: Daniel Crawford BSW:[id number] DOB: April 09, 1955 DOA: 07/04/2022     2 DOS: the patient was seen and examined on 07/06/2022   Brief hospital course: Mr. Marlin was admitted to the hospital with the working diagnosis of decompensated chronic liver disease.   67 yo male with the past medical history of T2DM, hypertension, hypothyroid and NASH who presented with lower extremity edema and dyspnea. At home on multiple diuretic therapy. Reported progressive orthopnea, fluid retention and edema at home. Positive weight gain 6 lbs since 05/25/22 from prior hospitalization. On his initial physical examination his blood pressure was 158/77, HR 82, RR 22 and 02 saturation 96%, luns with no wheezing or rales heart with S1 and s2 present and rhythmic, abdomen protuberant, positive lower extremity edema.   Patient has been placed on diuretic therapy with slow improvement in his symptoms.   Assessment and Plan: * Liver cirrhosis secondary to NASH Decompensated liver failure, with anasarca.  Patient has been placed on IV furosemide and spironolactone.  Documented urine output today is 5,700 cc   Systolic blood pressure is 120 to 130 mmHg.  Abdominal US with only trace of ascites.   Plan to continue diuresis. Continue xifaxan.   Acute exacerbation of CHF (congestive heart failure) 2022 echocardiogram with preserved LV systolic function with EF 70 to 75%.  Plan to continue diuresis and blood pressure monitoring  Will repeat echocardiogram.  Ok to discontinue telemetry.   Acquired hypothyroidism Continue with levothyroxine.   T2DM (type 2 diabetes mellitus) Continue close glucose monitoring. No hyperglycemia.  Hold on insulin therapy.   Essential hypertension Continue blood pressure control with carvedilol.   Morbid obesity Calculated BMI is 50,6  Hip osteoarthritis, add oral oxycodone for pain control.   CKD stage 3a, GFR 45-59 ml/min Hyponatremia.   Renal function with  serum cr at 1,25, K is 3,6 and serum bicarbonate at 30 Na 134.   Plan to continue diuresis and follow up renal function and electrolytes in am.  Continue K supplementation to prevent hypokalemia        Subjective: Patient with improvement in edema, no chest pain, no nausea or vomiting, positive anxiety and hip pain.   Physical Exam: Vitals:   07/05/22 2022 07/06/22 0437 07/06/22 0500 07/06/22 1500  BP: (!) 134/56 131/69  127/60  Pulse: 68 70  61  Resp: 18 18  18   Temp: 98.2 F (36.8 C) 98.2 F (36.8 C)  97.7 F (36.5 C)  TempSrc: Oral Oral    SpO2: 98% 93%  100%  Weight:   (!) 179 kg    Neurology awake and alert ENT With mild pallor Cardiovascular with S1 and S2 present and rhythmic with no gallops Respiratory with no rales or wheezing Abdomen with no distention, protuberant with ventral hernia  Positive lower extremity edema ++ Data Reviewed:    Family Communication: no family at the bedside   Disposition: Status is: Inpatient Remains inpatient appropriate because: volume overload   Planned Discharge Destination: Home      Author: Coralie Keens, MD 07/06/2022 5:19 PM  For on call review [email or web address].

## 2022-07-07 ENCOUNTER — Inpatient Hospital Stay (HOSPITAL_COMMUNITY): Payer: Medicare HMO

## 2022-07-07 ENCOUNTER — Other Ambulatory Visit (HOSPITAL_COMMUNITY): Payer: Self-pay | Admitting: *Deleted

## 2022-07-07 DIAGNOSIS — R0609 Other forms of dyspnea: Secondary | ICD-10-CM | POA: Diagnosis not present

## 2022-07-07 DIAGNOSIS — K7460 Unspecified cirrhosis of liver: Secondary | ICD-10-CM | POA: Diagnosis not present

## 2022-07-07 DIAGNOSIS — K7581 Nonalcoholic steatohepatitis (NASH): Secondary | ICD-10-CM | POA: Diagnosis not present

## 2022-07-07 DIAGNOSIS — R601 Generalized edema: Secondary | ICD-10-CM | POA: Diagnosis not present

## 2022-07-07 LAB — BASIC METABOLIC PANEL
Anion gap: 6 (ref 5–15)
BUN: 15 mg/dL (ref 8–23)
CO2: 32 mmol/L (ref 22–32)
Calcium: 8.6 mg/dL — ABNORMAL LOW (ref 8.9–10.3)
Chloride: 97 mmol/L — ABNORMAL LOW (ref 98–111)
Creatinine, Ser: 1.41 mg/dL — ABNORMAL HIGH (ref 0.61–1.24)
GFR, Estimated: 56 mL/min — ABNORMAL LOW (ref >60)
Glucose, Bld: 115 mg/dL — ABNORMAL HIGH (ref 70–99)
Potassium: 3.8 mmol/L (ref 3.5–5.1)
Sodium: 135 mmol/L (ref 135–145)

## 2022-07-07 LAB — ECHOCARDIOGRAM COMPLETE
Area-P 1/2: 4.06 cm2
Est EF: 55
Height: 74 in
S' Lateral: 3.6 cm
Weight: 6222.26 oz

## 2022-07-07 MED ORDER — TORSEMIDE 20 MG PO TABS
40.0000 mg | ORAL_TABLET | Freq: Two times a day (BID) | ORAL | Status: DC
  Administered 2022-07-07 – 2022-07-08 (×3): 40 mg via ORAL
  Filled 2022-07-07 (×2): qty 2

## 2022-07-07 NOTE — Progress Notes (Signed)
Progress Note   Patient: Sylvestre Rathgeber ZOX:[id number] DOB: 1955-05-08 DOA: 07/04/2022     3 DOS: the patient was seen and examined on 07/07/2022   Brief hospital course: Mr. Isidro was admitted to the hospital with the working diagnosis of decompensated chronic liver disease.   67 yo male with the past medical history of T2DM, hypertension, hypothyroid and NASH who presented with lower extremity edema and dyspnea. At home on multiple diuretic therapy. Reported progressive orthopnea, fluid retention and edema at home. Positive weight gain 6 lbs since 05/25/22 from prior hospitalization. On his initial physical examination his blood pressure was 158/77, HR 82, RR 22 and 02 saturation 96%, luns with no wheezing or rales heart with S1 and s2 present and rhythmic, abdomen protuberant, positive lower extremity edema.   Patient has been placed on diuretic therapy with slow improvement in his symptoms.   Assessment and Plan: 1)Liver cirrhosis secondary to NASH Decompensated liver failure, with anasarca/ascites 07/07/22 -Weight is down to 388 from 408 pounds -Diuresing well with IV furosemide and spironolactone.  -Negative fluid balance --Dr. Levon Hedger from GI service recommends against discharge home on 07/07/2022 -He recommends transitioning from IV Lasix to oral diuretics including oral torsemide and Aldactone at this time with possible discharge home in 24 hours if continues to diurese well on oral agents and renal function stabilizes -Continue Coreg for portal hypertension Continue xifaxan.  2) significant lower extremity edema/anasarca/ascites----this is most likely due to NASH liver cirrhosis; CHF -Echo from 07/07/2022 with preserved EF of 55%, and no significant abnormalities specifically no diastolic dysfunction..  No mitral stenosis, no aortic stenosis patient only has mild LVH -Diuretics as above #1 -Compliance with dietary recommendations encouraged  3)Acquired hypothyroidism Continue with  levothyroxine.   4)DM2- Recent A1c 5.1--- reflecting excellent diabetic control PTA -Patient is at risk for hypoglycemia -Avoid insulin -Continue to monitor fingersticks  5)Morbid Obesity- -Low calorie diet, portion control and increase physical activity discussed with patient -Body mass index is 49.93 kg/m.  6)CKD stage - 3A   -Creatinine is close to baseline even with IV diuresis -renally adjust medications, avoid nephrotoxic agents / dehydration  / hypotension   Hip osteoarthritis, add oral oxycodone for pain control.      Subjective: - Voiding okay -Had 3 BMs -No chest pains or palpitations -Dyspnea on exertion and lower extremity edema improving slowly --Dr. Levon Hedger from GI service recommends against discharge home on 07/07/2022  Physical Exam: Vitals:   07/06/22 1500 07/06/22 2119 07/07/22 0522 07/07/22 1555  BP: 127/60 (!) 112/55 129/72 111/62  Pulse: 61 61 70 (!) 56  Resp: Temp: 97.7 F (36.5 C) 97.9 F (36.6 C) 98 F (36.7 C) 97.6 F (36.4 C)  TempSrc:  Oral  Oral  SpO2: 100% 96% 94% 99%  Weight:   (!) 176.4 kg   Height:        Physical Exam  Gen:- Awake Alert, in no acute distress , morbidly obese in no acute distress HEENT:- [place].AT,  +ve sclera icterus Neck-Supple Neck,No JVD,.  Lungs-  CTAB , fair air movement bilaterally  CV- S1, S2 normal, RRR Abd-  +ve B.Sounds, Abd Soft, non- tender umbilical hernia, increased truncal adiposity and ascites Extremity/Skin:- +ve edema--improving edema,   good pedal pulses  Psych-affect is appropriate, oriented x3 Neuro-generalized weakness, no new focal deficits, no tremors  Family Communication: no family at the bedside   Status is: Inpatient Remains inpatient appropriate because: volume overload   Planned Discharge Destination: Home  Author: Shon Hale, MD 07/07/2022 5:33 PM  For on call review [email or web address].

## 2022-07-07 NOTE — Progress Notes (Signed)
*  PRELIMINARY RESULTS* Echocardiogram 2D Echocardiogram has been performed.  Stacey Drain 07/07/2022, 2:22 PM

## 2022-07-07 NOTE — Progress Notes (Signed)
Katrinka Blazing, M.D. Gastroenterology & Hepatology   Interval History:  No acute events overnight. Patient reports feeling much better without significant abdominal distention or tenderness.  Reported his lower extremities feel less swollen than before.  Overall feeling much better.  Inpatient Medications:  Current Facility-Administered Medications:    albuterol (PROVENTIL) (2.5 MG/3ML) 0.083% nebulizer solution 2.5 mg, 2.5 mg, Nebulization, Q2H PRN, Elgergawy, Leana Roe, MD   ALPRAZolam Prudy Feeler) tablet 0.5 mg, 0.5 mg, Oral, TID PRN, Arrien, York Ram, MD   carvedilol (COREG) tablet 6.25 mg, 6.25 mg, Oral, BID WC, Elgergawy, Leana Roe, MD, 6.25 mg at 07/06/22 1652   docusate sodium (COLACE) capsule 100 mg, 100 mg, Oral, BID, Elgergawy, Leana Roe, MD, 100 mg at 07/06/22 2231   furosemide (LASIX) injection 60 mg, 60 mg, Intravenous, Q12H, Elgergawy, Leana Roe, MD, 60 mg at 07/06/22 2232   gabapentin (NEURONTIN) capsule 100 mg, 100 mg, Oral, Daily PRN, Elgergawy, Leana Roe, MD, 100 mg at 07/06/22 0030   hydrALAZINE (APRESOLINE) injection 5 mg, 5 mg, Intravenous, Q4H PRN, Elgergawy, Leana Roe, MD   lactose free nutrition (Boost) liquid 237 mL, 237 mL, Oral, QPM, Elgergawy, Leana Roe, MD, 237 mL at 07/06/22 1653   levothyroxine (SYNTHROID) tablet 175 mcg, 175 mcg, Oral, QAC breakfast, Elgergawy, Leana Roe, MD, 175 mcg at 07/06/22 0929   ondansetron (ZOFRAN) tablet 4 mg, 4 mg, Oral, Q6H PRN **OR** ondansetron (ZOFRAN) injection 4 mg, 4 mg, Intravenous, Q6H PRN, Elgergawy, Leana Roe, MD, 4 mg at 07/06/22 0546   oxyCODONE (Oxy IR/ROXICODONE) immediate release tablet 5 mg, 5 mg, Oral, Q6H PRN, Arrien, York Ram, MD, 5 mg at 07/07/22 0531   pantoprazole (PROTONIX) EC tablet 40 mg, 40 mg, Oral, Daily, Elgergawy, Leana Roe, MD, 40 mg at 07/06/22 0930   potassium chloride SA (KLOR-CON M) CR tablet 40 mEq, 40 mEq, Oral, Daily, Elgergawy, Leana Roe, MD, 40 mEq at 07/06/22 0929   rifaximin (XIFAXAN) tablet  550 mg, 550 mg, Oral, BID, Elgergawy, Leana Roe, MD, 550 mg at 07/06/22 2231   spironolactone (ALDACTONE) tablet 150 mg, 150 mg, Oral, Daily, Marguerita Merles, Kashden Deboy, MD, 150 mg at 07/06/22 0930   I/O    Intake/Output Summary (Last 24 hours) at 07/07/2022 0823 Last data filed at 07/07/2022 0500 Gross per 24 hour  Intake 908.83 ml  Output 4900 ml  Net -3991.17 ml     Physical Exam: Temp:  [97.7 F (36.5 C)-98 F (36.7 C)] 98 F (36.7 C) (04/13 0522) Pulse Rate:  [61-70] 70 (04/13 0522) Resp:  [18-20] 20 (04/13 0522) BP: (112-129)/(55-72) 129/72 (04/13 0522) SpO2:  [94 %-100 %] 94 % (04/13 0522) Weight:  [176.4 kg] 176.4 kg (04/13 0522)  Temp (24hrs), Avg:97.9 F (36.6 C), Min:97.7 F (36.5 C), Max:98 F (36.7 C) GENERAL: The patient is AO x3, in no acute distress. HEENT: Head is normocephalic and atraumatic. EOMI are intact. Mouth is well hydrated and without lesions. NECK: Supple. No masses LUNGS: Clear to auscultation. No presence of rhonchi/wheezing/rales. Adequate chest expansion HEART: RRR, normal s1 and s2. ABDOMEN: Soft, nontender, no guarding, no peritoneal signs, and mildly distended due to wall edema. BS +. No masses. EXTREMITIES: Without any cyanosis, clubbing, rash, lesions.  Has presence of +2 pitting edema in both lower extremities up to both knees NEUROLOGIC: AOx3, no focal motor deficit. SKIN: no jaundice, no rashes  Laboratory Data: CBC:     Component Value Date/Time   WBC 3.1 (L) 07/06/2022 0507   RBC 3.68 (L) 07/06/2022 0507  HGB 10.6 (L) 07/06/2022 0507   HGB 12.3 (L) 12/27/2021 1213   HCT 33.4 (L) 07/06/2022 0507   HCT 36.5 (L) 12/27/2021 1213   PLT 69 (L) 07/06/2022 0507   PLT 63 (LL) 12/27/2021 1213   MCV 90.8 07/06/2022 0507   MCV 87 12/27/2021 1213   MCH 28.8 07/06/2022 0507   MCHC 31.7 07/06/2022 0507   RDW 16.7 (H) 07/06/2022 0507   RDW 15.6 (H) 12/27/2021 1213   LYMPHSABS 0.7 07/06/2022 0507   MONOABS 0.3 07/06/2022 0507   EOSABS 0.3  07/06/2022 0507   BASOSABS 0.0 07/06/2022 0507   COAG:  Lab Results  Component Value Date   INR 1.6 (H) 07/06/2022   INR 1.5 (H) 05/22/2022   INR 1.3 (H) 05/17/2022    BMP:     Latest Ref Rng & Units 07/07/2022    5:12 AM 07/06/2022    5:07 AM 07/05/2022    4:51 AM  BMP  Glucose 70 - 99 mg/dL 121  624  469   BUN 8 - 23 mg/dL 15  13  13    Creatinine 0.61 - 1.24 mg/dL 5.07  2.25  7.50   Sodium 135 - 145 mmol/L 135  134  134   Potassium 3.5 - 5.1 mmol/L 3.8  3.6  3.6   Chloride 98 - 111 mmol/L 97  100  100   CO2 22 - 32 mmol/L 32  30  31   Calcium 8.9 - 10.3 mg/dL 8.6  8.4  8.1     HEPATIC:     Latest Ref Rng & Units 07/06/2022    5:07 AM 07/05/2022    4:51 AM 07/04/2022    5:34 PM  Hepatic Function  Total Protein 6.5 - 8.1 g/dL 6.5  5.9  7.0   Albumin 3.5 - 5.0 g/dL 3.2  2.5  3.0   AST 15 - 41 U/L 37  37  48   ALT 0 - 44 U/L 17  17  19    Alk Phosphatase 38 - 126 U/L 89  91  111   Total Bilirubin 0.3 - 1.2 mg/dL 3.7  3.8  4.3   Bilirubin, Direct 0.0 - 0.2 mg/dL  1.0      CARDIAC:  Lab Results  Component Value Date   CKTOTAL 117 01/22/2022      Imaging: I personally reviewed and interpreted the available labs, imaging and endoscopic files.   Assessment/Plan: 67 y.o. male with a history of decompensated NASH cirrhosis with hepatic encephalopathy and anasarca, who presented to the hospital with edema and shortness of breath.  Patient was found to have acute anasarca.  It is possible that this was related to issues with directions with his diuretic regimen.  Had presence of severe third spacing which has responded to diuretic regimen.  Currently on furosemide IV and Aldactone.  At this point, we will switch him to torsemide 40 mg twice daily and Aldactone 150 mg daily.  Hopefully this will lead to continued diuresis without increases in his creatinine.  Patient has presented MELD score of more than 18 but he was deemed to be a poor surgical candidate for transplant given his  large BMI.  No signs of encephalopathy.  - Torsemide 40 mg BID - Aldactone 150 mg qday - Reduce salt intake to <2 g per day - Can take Tylenol max of 2 g per day (650 mg q8h) for pain - Avoid NSAIDs for pain - Avoid eating raw oysters/shellfish - Protein shake (Ensure  or Boost) every night before going to sleep  Katrinka Blazing, MD Gastroenterology and Hepatology [facility] Gastroenterology

## 2022-07-08 DIAGNOSIS — K7460 Unspecified cirrhosis of liver: Secondary | ICD-10-CM | POA: Diagnosis not present

## 2022-07-08 DIAGNOSIS — K7581 Nonalcoholic steatohepatitis (NASH): Secondary | ICD-10-CM | POA: Diagnosis not present

## 2022-07-08 DIAGNOSIS — R601 Generalized edema: Secondary | ICD-10-CM | POA: Diagnosis not present

## 2022-07-08 LAB — BASIC METABOLIC PANEL
Anion gap: 9 (ref 5–15)
BUN: 16 mg/dL (ref 8–23)
CO2: 33 mmol/L — ABNORMAL HIGH (ref 22–32)
Calcium: 9 mg/dL (ref 8.9–10.3)
Chloride: 95 mmol/L — ABNORMAL LOW (ref 98–111)
Creatinine, Ser: 1.47 mg/dL — ABNORMAL HIGH (ref 0.61–1.24)
GFR, Estimated: 53 mL/min — ABNORMAL LOW (ref >60)
Glucose, Bld: 114 mg/dL — ABNORMAL HIGH (ref 70–99)
Potassium: 3.9 mmol/L (ref 3.5–5.1)
Sodium: 137 mmol/L (ref 135–145)

## 2022-07-08 LAB — GLUCOSE, CAPILLARY
Glucose-Capillary: 129 mg/dL — ABNORMAL HIGH (ref 70–99)
Glucose-Capillary: 133 mg/dL — ABNORMAL HIGH (ref 70–99)
Glucose-Capillary: 198 mg/dL — ABNORMAL HIGH (ref 70–99)

## 2022-07-08 NOTE — Discharge Summary (Signed)
Physician Discharge Summary   Patient: Daniel Crawford MRN: [id number] DOB: 02-27-1956  Admit date:     07/04/2022  Discharge date: 07/08/22  Discharge Physician: Trula Ore Toby Breithaupt   PCP: [facility]   Recommendations at discharge:   Recommend close follow-up of kidney function and volume status.  Discharge Diagnoses: Principal Problem:   Liver cirrhosis secondary to NASH Active Problems:   Acute exacerbation of CHF (congestive heart failure)   Acquired hypothyroidism   T2DM (type 2 diabetes mellitus)   Essential hypertension   Morbid obesity   CKD stage 3a, GFR 45-59 ml/min    Hospital Course: Daniel Crawford is an 67 y.o. male with the past medical history of T2DM, hypertension, hypothyroid and NASH who presented with lower extremity edema and dyspnea. At home on multiple diuretic therapy. Reported progressive orthopnea, fluid retention and edema at home. Positive weight gain 6 lbs since 05/25/22 from prior hospitalization. On his initial physical examination his blood pressure was 158/77, HR 82, RR 22 and 02 saturation 96%, luns with no wheezing or rales heart with S1 and s2 present and rhythmic, abdomen protuberant, positive lower extremity edema.    Jacson was treated with diuretic therapy with slow improvement in his symptoms.   Assessment and Plan: Principal Problem:   Liver cirrhosis secondary to NASH associated with thrombocytopenia and coagulopathy Placed on IV furosemide as well as spironolactone with a negative fluid balance.  Remained on Coreg for portal hypertension as well as Xifaxan.  Renal function has remained stable with diuresis.  I/O balance -1.6 L over the past 24 hours and weight has decreased to 173.2 kg.  Della feels well enough to discharge home today.   Active Problems:   HFpEF/Acute exacerbation of CHF (congestive heart failure)/hypertensive heart disease with heart failure and stage IIIa CKD 2D echocardiogram performed 07/07/2022 showed preserved  ejection fraction at 55% and no significant diastolic dysfunction.  Currently remains on diuretic therapy.  Will continue diuretic therapy at discharge with instructions to weigh himself daily and to notify his primary care physician for any 2 pound weight gain/24 hours.  He will continue on torsemide and spironolactone at discharge.     Acquired hypothyroidism Continue levothyroxine.     T2DM (type 2 diabetes mellitus) A1c 5.1%.  Appears to be diet controlled as he was not on antidiabetic medications PTA.     Hyponatremia  Resolved.    CKD stage 3a, GFR 45-59 ml/min Will need close follow-up of his kidney function post diuresis given his ongoing need for high intensity diuretic therapy and spironolactone.     Nutritional status/morbid obesity  Body mass index is 49.02 kg/m.  Counseled on maintaining a heart healthy/low-salt diet.    Consultants: Gastroenterology Procedures performed: None Disposition: Home Diet recommendation:  Discharge Diet Orders (From admission, onward)     Start     Ordered   07/08/22 0000  Diet - low sodium heart healthy        07/08/22 1102           Cardiac diet DISCHARGE MEDICATION: Allergies as of 07/08/2022   No Known Allergies      Medication List     STOP taking these medications    furosemide 20 MG tablet Commonly known as: LASIX   metolazone 2.5 MG tablet Commonly known as: ZAROXOLYN   metolazone 5 MG tablet Commonly known as: ZAROXOLYN       TAKE these medications    acetaminophen 500 MG tablet Commonly known as: TYLENOL Take  1,000 mg by mouth every 6 (six) hours as needed for moderate pain.   albuterol 108 (90 Base) MCG/ACT inhaler Commonly known as: VENTOLIN HFA Inhale 2 puffs into the lungs every 6 (six) hours as needed for wheezing or shortness of breath.   carvedilol 6.25 MG tablet Commonly known as: COREG Take 1 tablet (6.25 mg total) by mouth 2 (two) times daily with a meal.   gabapentin 100 MG  capsule Commonly known as: NEURONTIN Take 100 mg by mouth daily as needed (pain).   hydrOXYzine 50 MG capsule Commonly known as: VISTARIL Take 1 capsule (50 mg total) by mouth 3 (three) times daily as needed for anxiety or nausea.   lactose free nutrition Liqd Take 237 mLs by mouth every evening.   levothyroxine 175 MCG tablet Commonly known as: SYNTHROID Take 175 mcg by mouth daily before breakfast.   ondansetron 8 MG tablet Commonly known as: ZOFRAN Take by mouth every 8 (eight) hours as needed for nausea or vomiting.   pantoprazole 40 MG tablet Commonly known as: PROTONIX Take 1 tablet (40 mg total) by mouth daily.   potassium chloride SA 20 MEQ tablet Commonly known as: KLOR-CON M Take 2 tablets (40 mEq total) by mouth daily. What changed: when to take this   rifaximin 550 MG Tabs tablet Commonly known as: XIFAXAN Take 1 tablet (550 mg total) by mouth 2 (two) times daily.   sertraline 50 MG tablet Commonly known as: ZOLOFT Take 1 tablet (50 mg total) by mouth at bedtime.   spironolactone 100 MG tablet Commonly known as: ALDACTONE Take 1 tablet (100 mg total) by mouth daily.   Torsemide 40 MG Tabs Take 40 mg by mouth 2 (two) times daily. What changed: when to take this   traZODone 50 MG tablet Commonly known as: DESYREL Take 1 tablet (50 mg total) by mouth at bedtime.        Discharge Exam: Filed Weights   07/06/22 0500 07/07/22 0522 07/08/22 0500  Weight: (!) 179 kg (!) 176.4 kg (!) 173.2 kg   General: No acute distress. Cardiovascular: Heart sounds show a regular rate, and rhythm. No gallops or rubs. No murmurs. No JVD. Lungs: Clear to auscultation bilaterally with good air movement. No rales, rhonchi or wheezes. Abdomen: Soft, nontender, nondistended with normal active bowel sounds. No masses. No hepatosplenomegaly. Neurological: Alert and oriented 3.  Skin: Warm and dry. No rashes or lesions. Extremities: No clubbing or cyanosis. No edema. Pedal  pulses 2+. Psychiatric: Mood and affect are normal. Insight and judgment are fair.   Condition at discharge: stable  The results of significant diagnostics from this hospitalization (including imaging, microbiology, ancillary and laboratory) are listed below for reference.   Imaging Studies: ECHOCARDIOGRAM COMPLETE  Result Date: 07/07/2022    ECHOCARDIOGRAM REPORT   Patient Name:   Hilton Zara    Date of Exam: 07/07/2022 Medical Rec #:  [id number]  Height:       74.0 in Accession #:    [id number] Weight:       388.9 lb Date of Birth:  1955/11/15 BSA:          2.883 m Patient Age:    66 years   BP:           156/82 mmHg Patient Gender: M          HR:           86 bpm. [facility] Procedure: 2D Echo, Cardiac Doppler and Color Doppler  Indications:    Dyspnea R06.00  History:        Patient has prior history of Echocardiogram examinations, most                 recent 08/31/2020. CHF; Risk Factors:Hypertension and Diabetes.                 NASH (nonalcoholic steatohepatitis) (From Hx).  Sonographer:    Celesta Gentile RCS Referring Phys: [id number] MAURICIO DANIEL ARRIEN IMPRESSIONS  1. Left ventricular ejection fraction, by estimation, is 55%. The left ventricle has normal function. The left ventricle has no regional wall motion abnormalities. There is mild left ventricular hypertrophy. Left ventricular diastolic parameters were normal.  2. Right ventricular systolic function is normal. The right ventricular size is mildly enlarged. Tricuspid regurgitation signal is inadequate for assessing PA pressure.  3. Left atrial size was mildly dilated.  4. Right atrial size was mildly dilated.  5. The mitral valve is normal in structure. Trivial mitral valve regurgitation. No evidence of mitral stenosis.  6. The aortic valve is grossly normal. Aortic valve regurgitation is not visualized. No aortic stenosis is present.  7. The inferior vena cava is dilated in size with >50% respiratory variability, suggesting right  atrial pressure of 8 mmHg. FINDINGS  Left Ventricle: Left ventricular ejection fraction, by estimation, is 55%. The left ventricle has normal function. The left ventricle has no regional wall motion abnormalities. The left ventricular internal cavity size was normal in size. There is mild left ventricular hypertrophy. Left ventricular diastolic parameters were normal. Right Ventricle: The right ventricular size is mildly enlarged. No increase in right ventricular wall thickness. Right ventricular systolic function is normal. Tricuspid regurgitation signal is inadequate for assessing PA pressure. Left Atrium: Left atrial size was mildly dilated. Right Atrium: Right atrial size was mildly dilated. Pericardium: There is no evidence of pericardial effusion. Mitral Valve: The mitral valve is normal in structure. Mild mitral annular calcification. Trivial mitral valve regurgitation. No evidence of mitral valve stenosis. Tricuspid Valve: The tricuspid valve is normal in structure. Tricuspid valve regurgitation is trivial. No evidence of tricuspid stenosis. Aortic Valve: The aortic valve is grossly normal. Aortic valve regurgitation is not visualized. No aortic stenosis is present. Pulmonic Valve: The pulmonic valve was normal in structure. Pulmonic valve regurgitation is not visualized. No evidence of pulmonic stenosis. Aorta: The aortic root is normal in size and structure. Ascending aorta measurements are within normal limits for age when indexed to body surface area. Venous: The inferior vena cava is dilated in size with greater than 50% respiratory variability, suggesting right atrial pressure of 8 mmHg. IAS/Shunts: The interatrial septum was not well visualized.  LEFT VENTRICLE PLAX 2D LVIDd:         5.40 cm   Diastology LVIDs:         3.60 cm   LV e' medial:    10.10 cm/s LV PW:         1.20 cm   LV E/e' medial:  9.8 LV IVS:        1.20 cm   LV e' lateral:   14.90 cm/s LVOT diam:     1.90 cm   LV E/e' lateral: 6.7 LV  SV:         84 LV SV Index:   29 LVOT Area:     2.84 cm  RIGHT VENTRICLE RV S prime:     14.90 cm/s TAPSE (M-mode): 3.7 cm LEFT ATRIUM  Index        RIGHT ATRIUM           Index LA diam:        4.60 cm  1.60 cm/m   RA Area:     35.20 cm LA Vol (A2C):   133.0 ml 46.13 ml/m  RA Volume:   137.00 ml 47.51 ml/m LA Vol (A4C):   107.0 ml 37.11 ml/m LA Biplane Vol: 120.0 ml 41.62 ml/m  AORTIC VALVE LVOT Vmax:   107.00 cm/s LVOT Vmean:  74.800 cm/s LVOT VTI:    0.296 m  AORTA Ao Root diam: 3.90 cm MITRAL VALVE MV Area (PHT): 4.06 cm    SHUNTS MV Decel Time: 187 msec    Systemic VTI:  0.30 m MV E velocity: 99.10 cm/s  Systemic Diam: 1.90 cm MV A velocity: 52.40 cm/s MV E/A ratio:  1.89 Weston Brass MD Electronically signed by Weston Brass MD Signature Date/Time: 07/07/2022/5:12:38 PM    Final    [place] ASCITES (ABDOMEN LIMITED)  Result Date: 07/05/2022 CLINICAL DATA:  History of cirrhosis EXAM: LIMITED ABDOMEN ULTRASOUND FOR ASCITES TECHNIQUE: Limited ultrasound survey for ascites was performed in all four abdominal quadrants. COMPARISON:  None Available. FINDINGS: All 4 quadrants skin for ascites, trace amount of fluid noted. IMPRESSION: Trace ascites. Electronically Signed   By: Allegra Lai M.D.   On: 07/05/2022 17:15   DG Chest Port 1 View  Result Date: 07/04/2022 CLINICAL DATA:  Shortness of breath with bilateral leg swelling and abdominal swelling. EXAM: PORTABLE CHEST 1 VIEW COMPARISON:  May 21, 2022 FINDINGS: The heart size and mediastinal contours are within normal limits. Both lungs are clear. The visualized skeletal structures are unremarkable. IMPRESSION: No active disease. Electronically Signed   By: Aram Candela M.D.   On: 07/04/2022 17:56    Microbiology: Results for orders placed or performed during the hospital encounter of 02/27/22  Resp Panel by RT-PCR (Flu A&B, Covid) Anterior Nasal Swab     Status: None   Collection Time: 02/27/22  6:48 PM   Specimen:  Anterior Nasal Swab  Result Value Ref Range Status   SARS Coronavirus 2 by RT PCR NEGATIVE NEGATIVE Final    Comment: (NOTE) SARS-CoV-2 target nucleic acids are NOT DETECTED.  The SARS-CoV-2 RNA is generally detectable in upper respiratory specimens during the acute phase of infection. The lowest concentration of SARS-CoV-2 viral copies this assay can detect is 138 copies/mL. A negative result does not preclude SARS-Cov-2 infection and should not be used as the sole basis for treatment or other patient management decisions. A negative result Hohmann occur with  improper specimen collection/handling, submission of specimen other than nasopharyngeal swab, presence of viral mutation(s) within the areas targeted by this assay, and inadequate number of viral copies(<138 copies/mL). A negative result must be combined with clinical observations, patient history, and epidemiological information. The expected result is Negative.  Fact Sheet for Patients:  BloggerCourse.com  Fact Sheet for Healthcare Providers:  SeriousBroker.it  This test is no t yet approved or cleared by the [place] FDA and  has been authorized for detection and/or diagnosis of SARS-CoV-2 by FDA under an Emergency Use Authorization (EUA). This EUA will remain  in effect (meaning this test can be used) for the duration of the COVID-19 declaration under Section 564(b)(1) of the Act, 21 U.S.C.section 360bbb-3(b)(1), unless the authorization is terminated  or revoked sooner.       Influenza A by PCR NEGATIVE NEGATIVE Final   Influenza B  by PCR NEGATIVE NEGATIVE Final    Comment: (NOTE) The Xpert Xpress SARS-CoV-2/FLU/RSV plus assay is intended as an aid in the diagnosis of influenza from Nasopharyngeal swab specimens and should not be used as a sole basis for treatment. Nasal washings and aspirates are unacceptable for Xpert Xpress SARS-CoV-2/FLU/RSV testing.  Fact  Sheet for Patients: BloggerCourse.com  Fact Sheet for Healthcare Providers: SeriousBroker.it  This test is not yet approved or cleared by the [place] FDA and has been authorized for detection and/or diagnosis of SARS-CoV-2 by FDA under an Emergency Use Authorization (EUA). This EUA will remain in effect (meaning this test can be used) for the duration of the COVID-19 declaration under Section 564(b)(1) of the Act, 21 U.S.C. section 360bbb-3(b)(1), unless the authorization is terminated or revoked.  Performed at [facility], [address] [place], [place] [address]     Labs: CBC: Recent Labs  Lab 07/04/22 1734 07/05/22 0451 07/06/22 0507  WBC 5.6 4.1 3.1*  NEUTROABS 4.0  --  1.7  HGB 11.9* 10.3* 10.6*  HCT 36.4* 31.6* 33.4*  MCV 90.3 89.0 90.8  PLT 85* 74* 69*   Basic Metabolic Panel: Recent Labs  Lab 07/04/22 1734 07/05/22 0451 07/05/22 1521 07/06/22 0507 07/07/22 0512 07/08/22 0455  NA 133* 134*  --  134* 135 137  K 3.6 3.6  --  3.6 3.8 3.9  CL 95* 100  --  100 97* 95*  CO2 27 31  --  30 32 33*  GLUCOSE 148* 117*  --  104* 115* 114*  BUN 13 13  --  13 15 16   CREATININE 1.35* 1.35*  --  1.25* 1.41* 1.47*  CALCIUM 8.2* 8.1*  --  8.4* 8.6* 9.0  MG 2.0  --  2.0  --   --   --    Liver Function Tests: Recent Labs  Lab 07/04/22 1734 07/05/22 0451 07/06/22 0507  AST 48* 37 37  ALT 19 17 17   ALKPHOS 111 91 89  BILITOT 4.3* 3.8* 3.7*  PROT 7.0 5.9* 6.5  ALBUMIN 3.0* 2.5* 3.2*   CBG: Recent Labs  Lab 07/05/22 0739 07/05/22 1116 07/08/22 0011 07/08/22 0610 07/08/22 1110  GLUCAP 116* 139* 129* 198* 133*    Discharge time spent: greater than 30 minutes.  Signed: Hillery Aldo, MD [facility] 07/08/2022

## 2022-07-08 NOTE — TOC Transition Note (Signed)
Transition of Care [facility]) - CM/SW Discharge Note   Patient Details  Name: Daniel Crawford MRN: [id number] Date of Birth: [birth year]  Transition of Care [facility]) CM/SW Contact:  Catalina Gravel, LCSW Phone Number: 07/08/2022, 11:49 AM   Clinical Narrative:    Pt ready for DC 4/14. No TOC needs identified.      Barriers to Discharge: No Barriers Identified   Patient Goals and CMS Choice CMS Medicare.gov Compare Post Acute Care list provided to:: Patient Choice offered to / list presented to : Patient  Discharge Placement                         Discharge Plan and Services Additional resources added to the After Visit Summary for   In-house Referral: Clinical Social Work Discharge Planning Services: CM Consult                                 Social Determinants of Health (SDOH) Interventions SDOH Screenings   Food Insecurity: No Food Insecurity (07/04/2022)  Housing: Low Risk  (07/04/2022)  Transportation Needs: No Transportation Needs (07/04/2022)  Utilities: Not At Risk (07/04/2022)  Depression (PHQ2-9): Medium Risk (10/27/2019)  Tobacco Use: Medium Risk (07/04/2022)     Readmission Risk Interventions    07/05/2022   12:16 PM 05/22/2022   10:18 AM  Readmission Risk Prevention Plan  Transportation Screening Complete Complete  Medication Review Oceanographer) Complete Complete  HRI or Home Care Consult Complete Complete  SW Recovery Care/Counseling Consult Complete Complete  Palliative Care Screening Not Applicable Not Applicable  Skilled Nursing Facility Not Applicable Not Applicable

## 2022-07-08 NOTE — Progress Notes (Signed)
Katrinka Blazing, M.D. Gastroenterology & Hepatology   Interval History:  Patient reported improvement in abdominal distention and lower extremity edema.  Denies any nausea, vomiting, fever, chills pneumonia pain, melena or hematochezia. Creatinine was stable at 1.47 with normal electrolytes.  Inpatient Medications:  Current Facility-Administered Medications:    albuterol (PROVENTIL) (2.5 MG/3ML) 0.083% nebulizer solution 2.5 mg, 2.5 mg, Nebulization, Q2H PRN, Elgergawy, Leana Roe, MD   ALPRAZolam Prudy Feeler) tablet 0.5 mg, 0.5 mg, Oral, TID PRN, Arrien, York Ram, MD   carvedilol (COREG) tablet 6.25 mg, 6.25 mg, Oral, BID WC, Elgergawy, Leana Roe, MD, 6.25 mg at 07/07/22 1746   docusate sodium (COLACE) capsule 100 mg, 100 mg, Oral, BID, Elgergawy, Leana Roe, MD, 100 mg at 07/07/22 2151   gabapentin (NEURONTIN) capsule 100 mg, 100 mg, Oral, Daily PRN, Elgergawy, Leana Roe, MD, 100 mg at 07/06/22 0030   hydrALAZINE (APRESOLINE) injection 5 mg, 5 mg, Intravenous, Q4H PRN, Elgergawy, Leana Roe, MD   lactose free nutrition (Boost) liquid 237 mL, 237 mL, Oral, QPM, Elgergawy, Leana Roe, MD, 237 mL at 07/07/22 1747   levothyroxine (SYNTHROID) tablet 175 mcg, 175 mcg, Oral, QAC breakfast, Elgergawy, Leana Roe, MD, 175 mcg at 07/07/22 0841   ondansetron (ZOFRAN) tablet 4 mg, 4 mg, Oral, Q6H PRN **OR** ondansetron (ZOFRAN) injection 4 mg, 4 mg, Intravenous, Q6H PRN, Elgergawy, Leana Roe, MD, 4 mg at 07/06/22 0546   oxyCODONE (Oxy IR/ROXICODONE) immediate release tablet 5 mg, 5 mg, Oral, Q6H PRN, Arrien, York Ram, MD, 5 mg at 07/07/22 0531   pantoprazole (PROTONIX) EC tablet 40 mg, 40 mg, Oral, Daily, Elgergawy, Leana Roe, MD, 40 mg at 07/07/22 0841   potassium chloride SA (KLOR-CON M) CR tablet 40 mEq, 40 mEq, Oral, Daily, Elgergawy, Leana Roe, MD, 40 mEq at 07/07/22 0841   rifaximin (XIFAXAN) tablet 550 mg, 550 mg, Oral, BID, Elgergawy, Leana Roe, MD, 550 mg at 07/07/22 2151   spironolactone  (ALDACTONE) tablet 150 mg, 150 mg, Oral, Daily, Marguerita Merles, Feras Gardella, MD, 150 mg at 07/07/22 0840   torsemide (DEMADEX) tablet 40 mg, 40 mg, Oral, BID, Marguerita Merles, Jaquavian Firkus, MD, 40 mg at 07/07/22 1746   I/O    Intake/Output Summary (Last 24 hours) at 07/08/2022 0809 Last data filed at 07/08/2022 5366 Gross per 24 hour  Intake 720 ml  Output 2400 ml  Net -1680 ml     Physical Exam: Temp:  [97.6 F (36.4 C)-98.1 F (36.7 C)] 98 F (36.7 C) (04/14 0500) Pulse Rate:  [56-64] 63 (04/14 0500) Resp:  [19] 19 (04/14 0500) BP: (111-123)/(56-66) 118/66 (04/14 0500) SpO2:  [93 %-99 %] 93 % (04/14 0500) Weight:  [173.2 kg] 173.2 kg (04/14 0500)  Temp (24hrs), Avg:97.9 F (36.6 C), Min:97.6 F (36.4 C), Max:98.1 F (36.7 C) GENERAL: The patient is AO x3, in no acute distress. HEENT: Head is normocephalic and atraumatic. EOMI are intact. Mouth is well hydrated and without lesions. NECK: Supple. No masses LUNGS: Clear to auscultation. No presence of rhonchi/wheezing/rales. Adequate chest expansion HEART: RRR, normal s1 and s2. ABDOMEN: Soft, nontender, no guarding, no peritoneal signs, and mildly distended due to wall edema. BS +. No masses. EXTREMITIES: Without any cyanosis, clubbing, rash, lesions.  Has presence of +2 pitting edema in both lower extremities up to both knees NEUROLOGIC: AOx3, no focal motor deficit. SKIN: no jaundice, no rashes  Laboratory Data: CBC:     Component Value Date/Time   WBC 3.1 (L) 07/06/2022 0507   RBC 3.68 (L) 07/06/2022 0507  HGB 10.6 (L) 07/06/2022 0507   HGB 12.3 (L) 12/27/2021 1213   HCT 33.4 (L) 07/06/2022 0507   HCT 36.5 (L) 12/27/2021 1213   PLT 69 (L) 07/06/2022 0507   PLT 63 (LL) 12/27/2021 1213   MCV 90.8 07/06/2022 0507   MCV 87 12/27/2021 1213   MCH 28.8 07/06/2022 0507   MCHC 31.7 07/06/2022 0507   RDW 16.7 (H) 07/06/2022 0507   RDW 15.6 (H) 12/27/2021 1213   LYMPHSABS 0.7 07/06/2022 0507   MONOABS 0.3 07/06/2022 0507    EOSABS 0.3 07/06/2022 0507   BASOSABS 0.0 07/06/2022 0507   COAG:  Lab Results  Component Value Date   INR 1.6 (H) 07/06/2022   INR 1.5 (H) 05/22/2022   INR 1.3 (H) 05/17/2022    BMP:     Latest Ref Rng & Units 07/08/2022    4:55 AM 07/07/2022    5:12 AM 07/06/2022    5:07 AM  BMP  Glucose 70 - 99 mg/dL 161  096  045   BUN 8 - 23 mg/dL Creatinine 0.61 - 1.24 mg/dL 4.09  8.11  9.14   Sodium 135 - 145 mmol/L 137  135  134   Potassium 3.5 - 5.1 mmol/L 3.9  3.8  3.6   Chloride 98 - 111 mmol/L 95  97  100   CO2 22 - 32 mmol/L 33  32  30   Calcium 8.9 - 10.3 mg/dL 9.0  8.6  8.4     HEPATIC:     Latest Ref Rng & Units 07/06/2022    5:07 AM 07/05/2022    4:51 AM 07/04/2022    5:34 PM  Hepatic Function  Total Protein 6.5 - 8.1 g/dL 6.5  5.9  7.0   Albumin 3.5 - 5.0 g/dL 3.2  2.5  3.0   AST 15 - 41 U/L 37  37  48   ALT 0 - 44 U/L Alk Phosphatase 38 - 126 U/L 89  91  111   Total Bilirubin 0.3 - 1.2 mg/dL 3.7  3.8  4.3   Bilirubin, Direct 0.0 - 0.2 mg/dL  1.0      CARDIAC:  Lab Results  Component Value Date   CKTOTAL 117 01/22/2022      Imaging: I personally reviewed and interpreted the available labs, imaging and endoscopic files.   Assessment/Plan: 67 y.o. male with a history of decompensated NASH cirrhosis with hepatic encephalopathy and anasarca, who presented to the hospital with edema and shortness of breath.  Patient was found to have acute anasarca.  It is possible that this was related to issues with directions with his diuretic regimen.  Had presence of severe third spacing which has responded to diuretic regimen.  The patient has responded to combined regimen of torsemide and Aldactone without any significant increase in his creatinine or abnormalities in his electrolytes.  Will continue with the same regimen as outpatient.   Patient has presented MELD score of more than 18 but he was deemed to be a poor surgical candidate for transplant given  his large BMI.  No signs of encephalopathy.   - Torsemide 40 mg BID - Aldactone 150 mg qday - Reduce salt intake to <2 g per day - Can take Tylenol max of 2 g per day (650 mg q8h) for pain - Avoid NSAIDs for pain - Avoid eating raw oysters/shellfish - Protein shake (Ensure or Boost) every night  before going to sleep - Patient to follow-up on 07/11/2022 with Lewie Loron, [place] repeat MELD labs at that time -Repeat outpatient colonoscopy once patient is more euvolemic - GI service will sign-off, please call us back if you have any more questions.   Katrinka Blazing, MD Gastroenterology and Hepatology [facility]

## 2022-07-09 ENCOUNTER — Ambulatory Visit: Payer: Medicare HMO | Admitting: Pulmonary Disease

## 2022-07-10 ENCOUNTER — Encounter (HOSPITAL_COMMUNITY): Payer: Self-pay | Admitting: Emergency Medicine

## 2022-07-10 ENCOUNTER — Inpatient Hospital Stay (HOSPITAL_COMMUNITY)
Admission: EM | Admit: 2022-07-10 | Discharge: 2022-07-13 | DRG: 442 | Disposition: A | Discharge status: Home / Self Care | Payer: Medicare HMO | Attending: Internal Medicine | Admitting: Internal Medicine

## 2022-07-10 ENCOUNTER — Other Ambulatory Visit: Payer: Self-pay

## 2022-07-10 DIAGNOSIS — Z743 Need for continuous supervision: Secondary | ICD-10-CM | POA: Diagnosis not present

## 2022-07-10 DIAGNOSIS — N289 Disorder of kidney and ureter, unspecified: Secondary | ICD-10-CM

## 2022-07-10 DIAGNOSIS — I10 Essential (primary) hypertension: Secondary | ICD-10-CM | POA: Diagnosis not present

## 2022-07-10 DIAGNOSIS — K648 Other hemorrhoids: Secondary | ICD-10-CM | POA: Diagnosis present

## 2022-07-10 DIAGNOSIS — K7682 Hepatic encephalopathy: Secondary | ICD-10-CM | POA: Diagnosis not present

## 2022-07-10 DIAGNOSIS — D649 Anemia, unspecified: Secondary | ICD-10-CM | POA: Diagnosis not present

## 2022-07-10 DIAGNOSIS — D696 Thrombocytopenia, unspecified: Secondary | ICD-10-CM | POA: Diagnosis not present

## 2022-07-10 DIAGNOSIS — Z86010 Personal history of colonic polyps: Secondary | ICD-10-CM

## 2022-07-10 DIAGNOSIS — E1122 Type 2 diabetes mellitus with diabetic chronic kidney disease: Secondary | ICD-10-CM | POA: Diagnosis present

## 2022-07-10 DIAGNOSIS — I5032 Chronic diastolic (congestive) heart failure: Secondary | ICD-10-CM | POA: Diagnosis present

## 2022-07-10 DIAGNOSIS — D61818 Other pancytopenia: Secondary | ICD-10-CM | POA: Diagnosis present

## 2022-07-10 DIAGNOSIS — I272 Pulmonary hypertension, unspecified: Secondary | ICD-10-CM | POA: Diagnosis present

## 2022-07-10 DIAGNOSIS — N1831 Chronic kidney disease, stage 3a: Secondary | ICD-10-CM | POA: Diagnosis present

## 2022-07-10 DIAGNOSIS — F418 Other specified anxiety disorders: Secondary | ICD-10-CM | POA: Diagnosis present

## 2022-07-10 DIAGNOSIS — Z87891 Personal history of nicotine dependence: Secondary | ICD-10-CM

## 2022-07-10 DIAGNOSIS — F419 Anxiety disorder, unspecified: Secondary | ICD-10-CM | POA: Diagnosis present

## 2022-07-10 DIAGNOSIS — Z79899 Other long term (current) drug therapy: Secondary | ICD-10-CM

## 2022-07-10 DIAGNOSIS — Z79890 Hormone replacement therapy (postmenopausal): Secondary | ICD-10-CM

## 2022-07-10 DIAGNOSIS — K219 Gastro-esophageal reflux disease without esophagitis: Secondary | ICD-10-CM | POA: Diagnosis present

## 2022-07-10 DIAGNOSIS — Z800 Family history of malignant neoplasm of digestive organs: Secondary | ICD-10-CM

## 2022-07-10 DIAGNOSIS — R410 Disorientation, unspecified: Secondary | ICD-10-CM | POA: Diagnosis not present

## 2022-07-10 DIAGNOSIS — E039 Hypothyroidism, unspecified: Secondary | ICD-10-CM | POA: Diagnosis present

## 2022-07-10 DIAGNOSIS — K644 Residual hemorrhoidal skin tags: Secondary | ICD-10-CM | POA: Diagnosis present

## 2022-07-10 DIAGNOSIS — R4182 Altered mental status, unspecified: Secondary | ICD-10-CM | POA: Diagnosis not present

## 2022-07-10 DIAGNOSIS — K7460 Unspecified cirrhosis of liver: Secondary | ICD-10-CM | POA: Diagnosis present

## 2022-07-10 DIAGNOSIS — F32A Depression, unspecified: Secondary | ICD-10-CM | POA: Diagnosis present

## 2022-07-10 DIAGNOSIS — Z8249 Family history of ischemic heart disease and other diseases of the circulatory system: Secondary | ICD-10-CM

## 2022-07-10 DIAGNOSIS — R188 Other ascites: Secondary | ICD-10-CM | POA: Diagnosis present

## 2022-07-10 DIAGNOSIS — I130 Hypertensive heart and chronic kidney disease with heart failure and stage 1 through stage 4 chronic kidney disease, or unspecified chronic kidney disease: Secondary | ICD-10-CM | POA: Diagnosis present

## 2022-07-10 DIAGNOSIS — Z9049 Acquired absence of other specified parts of digestive tract: Secondary | ICD-10-CM

## 2022-07-10 DIAGNOSIS — K7581 Nonalcoholic steatohepatitis (NASH): Secondary | ICD-10-CM | POA: Diagnosis present

## 2022-07-10 HISTORY — DX: Hepatic encephalopathy: K76.82

## 2022-07-10 LAB — COMPREHENSIVE METABOLIC PANEL
ALT: 20 U/L (ref 0–44)
AST: 46 U/L — ABNORMAL HIGH (ref 15–41)
Albumin: 3.2 g/dL — ABNORMAL LOW (ref 3.5–5.0)
Alkaline Phosphatase: 94 U/L (ref 38–126)
Anion gap: 6 (ref 5–15)
BUN: 18 mg/dL (ref 8–23)
CO2: 29 mmol/L (ref 22–32)
Calcium: 8.7 mg/dL — ABNORMAL LOW (ref 8.9–10.3)
Chloride: 101 mmol/L (ref 98–111)
Creatinine, Ser: 1.51 mg/dL — ABNORMAL HIGH (ref 0.61–1.24)
GFR, Estimated: 51 mL/min — ABNORMAL LOW (ref >60)
Glucose, Bld: 121 mg/dL — ABNORMAL HIGH (ref 70–99)
Potassium: 3.9 mmol/L (ref 3.5–5.1)
Sodium: 136 mmol/L (ref 135–145)
Total Bilirubin: 5.1 mg/dL — ABNORMAL HIGH (ref 0.3–1.2)
Total Protein: 6.7 g/dL (ref 6.5–8.1)

## 2022-07-10 LAB — URINALYSIS, ROUTINE W REFLEX MICROSCOPIC
Bacteria, UA: NONE SEEN
Glucose, UA: NEGATIVE mg/dL
Ketones, ur: NEGATIVE mg/dL
Leukocytes,Ua: NEGATIVE
Nitrite: NEGATIVE
Protein, ur: 30 mg/dL — AB
RBC / HPF: >50 RBC/hpf (ref 0–5)
Specific Gravity, Urine: 1.021 (ref 1.005–1.030)
pH: 7 (ref 5.0–8.0)

## 2022-07-10 LAB — CBC
HCT: 33.5 % — ABNORMAL LOW (ref 39.0–52.0)
Hemoglobin: 10.9 g/dL — ABNORMAL LOW (ref 13.0–17.0)
MCH: 29.4 pg (ref 26.0–34.0)
MCHC: 32.5 g/dL (ref 30.0–36.0)
MCV: 90.3 fL (ref 80.0–100.0)
Platelets: 68 10*3/uL — ABNORMAL LOW (ref 150–400)
RBC: 3.71 MIL/uL — ABNORMAL LOW (ref 4.22–5.81)
RDW: 17.3 % — ABNORMAL HIGH (ref 11.5–15.5)
WBC: 4.3 10*3/uL (ref 4.0–10.5)
nRBC: 0 % (ref 0.0–0.2)

## 2022-07-10 LAB — AMMONIA: Ammonia: 77 umol/L — ABNORMAL HIGH (ref 9–35)

## 2022-07-10 LAB — CBG MONITORING, ED: Glucose-Capillary: 125 mg/dL — ABNORMAL HIGH (ref 70–99)

## 2022-07-10 MED ORDER — LACTULOSE 10 GM/15ML PO SOLN
30.0000 g | Freq: Once | ORAL | Status: AC
  Administered 2022-07-10: 30 g via ORAL
  Filled 2022-07-10: qty 60

## 2022-07-10 NOTE — ED Triage Notes (Signed)
Pt discharged from hospital Sunday and family states pt has been confused since.

## 2022-07-10 NOTE — ED Provider Notes (Signed)
[facility] AT [facility] Provider Note   CSN: [id number] Arrival date & time: 07/10/22  2051     History  Chief Complaint  Patient presents with   Altered Mental Status    Daniel Crawford is a 67 y.o. male.  The history is provided by the patient.  Altered Mental Status He has history of diabetes, hypertension, cirrhosis secondary to non alcoholic steatohepatitis, heart failure and comes in because of confusion today.  He was recently admitted for heart failure exacerbation, family had reported that he has had confusion since being discharged.   Home Medications Prior to Admission medications   Medication Sig Start Date End Date Taking? Authorizing Provider  acetaminophen (TYLENOL) 500 MG tablet Take 1,000 mg by mouth every 6 (six) hours as needed for moderate pain.    [provider]  albuterol (VENTOLIN HFA) 108 (90 Base) MCG/ACT inhaler Inhale 2 puffs into the lungs every 6 (six) hours as needed for wheezing or shortness of breath. 05/01/22   Oretha Milch, MD  carvedilol (COREG) 6.25 MG tablet Take 1 tablet (6.25 mg total) by mouth 2 (two) times daily with a meal. 01/25/22   Emokpae, Courage, MD  gabapentin (NEURONTIN) 100 MG capsule Take 100 mg by mouth daily as needed (pain). 02/20/21   [provider]  hydrOXYzine (VISTARIL) 50 MG capsule Take 1 capsule (50 mg total) by mouth 3 (three) times daily as needed for anxiety or nausea. 01/25/22   Shon Hale, MD  lactose free nutrition (BOOST) LIQD Take 237 mLs by mouth every evening.    [provider]  levothyroxine (SYNTHROID) 175 MCG tablet Take 175 mcg by mouth daily before breakfast.    [provider]  ondansetron (ZOFRAN) 8 MG tablet Take by mouth every 8 (eight) hours as needed for nausea or vomiting.    [provider]  pantoprazole (PROTONIX) 40 MG tablet Take 1 tablet (40 mg total) by mouth daily. 01/25/22   Shon Hale, MD  potassium chloride SA  (KLOR-CON M) 20 MEQ tablet Take 2 tablets (40 mEq total) by mouth daily. Patient taking differently: Take 40 mEq by mouth 2 (two) times daily. 03/05/22   Johnson, Clanford L, MD  rifaximin (XIFAXAN) 550 MG TABS tablet Take 1 tablet (550 mg total) by mouth 2 (two) times daily. 01/25/22   Shon Hale, MD  sertraline (ZOLOFT) 50 MG tablet Take 1 tablet (50 mg total) by mouth at bedtime. 01/25/22   Shon Hale, MD  spironolactone (ALDACTONE) 100 MG tablet Take 1 tablet (100 mg total) by mouth daily. 03/04/22   Johnson, Clanford L, MD  torsemide 40 MG TABS Take 40 mg by mouth 2 (two) times daily. Patient taking differently: Take 40 mg by mouth daily. 04/10/22   Catarina Hartshorn, MD  traZODone (DESYREL) 50 MG tablet Take 1 tablet (50 mg total) by mouth at bedtime. 01/25/22   Shon Hale, MD      Allergies    Patient has no known allergies.    Review of Systems   Review of Systems  All other systems reviewed and are negative.   Physical Exam Updated Vital Signs BP 139/81   Pulse 69   Temp 98.1 F (36.7 C) (Oral)   Resp 19   Ht 6\' 2"  (1.88 m)   Wt (!) 175 kg   SpO2 97%   BMI 49.53 kg/m  Physical Exam Vitals and nursing note reviewed.   67 year old male, resting comfortably and in no  acute distress. Vital signs are normal. Oxygen saturation is 97%, which is normal. Head is normocephalic and atraumatic. PERRLA, EOMI. Oropharynx is clear. Neck is nontender and supple without adenopathy or JVD. Back is nontender and there is no CVA tenderness. Lungs are clear without rales, wheezes, or rhonchi. Chest is nontender. Heart has regular rate and rhythm without murmur. Abdomen is soft, flat, nontender without masses or hepatosplenomegaly and peristalsis is normoactive. Extremities have 2+ edema, full range of motion is present. Skin is warm and dry without rash. Neurologic: Awake and alert, oriented x 3, cranial nerves are intact, moves all extremities equally, asterixis present.  ED  Results / Procedures / Treatments   Labs (all labs ordered are listed, but only abnormal results are displayed) Labs Reviewed  COMPREHENSIVE METABOLIC PANEL - Abnormal; Notable for the following components:      Result Value   Glucose, Bld 121 (*)    Creatinine, Ser 1.51 (*)    Calcium 8.7 (*)    Albumin 3.2 (*)    AST 46 (*)    Total Bilirubin 5.1 (*)    GFR, Estimated 51 (*)    All other components within normal limits  CBC - Abnormal; Notable for the following components:   RBC 3.71 (*)    Hemoglobin 10.9 (*)    HCT 33.5 (*)    RDW 17.3 (*)    Platelets 68 (*)    All other components within normal limits  URINALYSIS, ROUTINE W REFLEX MICROSCOPIC - Abnormal; Notable for the following components:   Color, Urine AMBER (*)    Hgb urine dipstick SMALL (*)    Bilirubin Urine SMALL (*)    Protein, ur 30 (*)    All other components within normal limits  AMMONIA - Abnormal; Notable for the following components:   Ammonia 77 (*)    All other components within normal limits  CBG MONITORING, ED - Abnormal; Notable for the following components:   Glucose-Capillary 125 (*)    All other components within normal limits    EKG EKG Interpretation  Date/Time:  Tuesday July 10 2022 20:59:29 EDT Ventricular Rate:  68 PR Interval:  194 QRS Duration: 100 QT Interval:  438 QTC Calculation: 466 R Axis:   28 Text Interpretation: Sinus rhythm Consider anterior infarct When compared with ECG of 07/06/2022, QT has shortened Confirmed by Dione Booze ([id number]) on 07/10/2022 10:52:09 PM  Radiology No results found.  Procedures Procedures    Medications Ordered in ED Medications  lactulose (CHRONULAC) 10 GM/15ML solution 30 g (has no administration in time range)    ED Course/ Medical Decision Making/ A&P                             Medical Decision Making Amount and/or Complexity of Data Reviewed Labs: ordered.   Hepatic encephalopathy exacerbated by recent diuresis.  I have  reviewed his past records and he was admitted on 07/04/2022 and discharged on 07/08/2022 for diuresis secondary to heart failure exacerbation.  I have reviewed and interpreted his laboratory tests, and my impression is stable renal insufficiency, stable hypoalbuminemia, stable mild elevation of AST, stable anemia, stable thrombocytopenia, elevated ammonia level.  Urinalysis has slight proteinuria and greater than 50 RBCs but no evidence of infection.  I believe that his hepatic encephalopathy was exacerbated by recent diuresis.  I have ordered a dose of lactulose and feel he will need to be admitted for correction of his hepatic  encephalopathy.  I have discussed the case with Dr. Carren Rang of [facility], who agrees to admit the patient.  Final Clinical Impression(s) / ED Diagnoses Final diagnoses:  Hepatic encephalopathy  Renal insufficiency  Normochromic normocytic anemia  Thrombocytopenia    Rx / DC Orders ED Discharge Orders     None         Dione Booze, MD 07/10/22 2325

## 2022-07-10 NOTE — ED Notes (Signed)
ED Provider at bedside. 

## 2022-07-11 ENCOUNTER — Encounter: Payer: Self-pay | Admitting: Gastroenterology

## 2022-07-11 ENCOUNTER — Ambulatory Visit: Payer: Medicare HMO | Admitting: Gastroenterology

## 2022-07-11 DIAGNOSIS — K7682 Hepatic encephalopathy: Secondary | ICD-10-CM | POA: Diagnosis present

## 2022-07-11 DIAGNOSIS — N1831 Chronic kidney disease, stage 3a: Secondary | ICD-10-CM | POA: Diagnosis not present

## 2022-07-11 DIAGNOSIS — E039 Hypothyroidism, unspecified: Secondary | ICD-10-CM | POA: Diagnosis not present

## 2022-07-11 DIAGNOSIS — D696 Thrombocytopenia, unspecified: Secondary | ICD-10-CM | POA: Diagnosis not present

## 2022-07-11 DIAGNOSIS — I10 Essential (primary) hypertension: Secondary | ICD-10-CM

## 2022-07-11 DIAGNOSIS — F419 Anxiety disorder, unspecified: Secondary | ICD-10-CM | POA: Diagnosis not present

## 2022-07-11 DIAGNOSIS — F418 Other specified anxiety disorders: Secondary | ICD-10-CM

## 2022-07-11 DIAGNOSIS — I130 Hypertensive heart and chronic kidney disease with heart failure and stage 1 through stage 4 chronic kidney disease, or unspecified chronic kidney disease: Secondary | ICD-10-CM | POA: Diagnosis not present

## 2022-07-11 DIAGNOSIS — Z87891 Personal history of nicotine dependence: Secondary | ICD-10-CM | POA: Diagnosis not present

## 2022-07-11 DIAGNOSIS — I5032 Chronic diastolic (congestive) heart failure: Secondary | ICD-10-CM | POA: Diagnosis not present

## 2022-07-11 DIAGNOSIS — K219 Gastro-esophageal reflux disease without esophagitis: Secondary | ICD-10-CM

## 2022-07-11 DIAGNOSIS — K7581 Nonalcoholic steatohepatitis (NASH): Secondary | ICD-10-CM | POA: Diagnosis present

## 2022-07-11 DIAGNOSIS — K7460 Unspecified cirrhosis of liver: Secondary | ICD-10-CM | POA: Diagnosis not present

## 2022-07-11 DIAGNOSIS — Z800 Family history of malignant neoplasm of digestive organs: Secondary | ICD-10-CM | POA: Diagnosis not present

## 2022-07-11 DIAGNOSIS — F32A Depression, unspecified: Secondary | ICD-10-CM | POA: Diagnosis not present

## 2022-07-11 DIAGNOSIS — R69 Illness, unspecified: Secondary | ICD-10-CM | POA: Diagnosis not present

## 2022-07-11 DIAGNOSIS — K644 Residual hemorrhoidal skin tags: Secondary | ICD-10-CM | POA: Diagnosis not present

## 2022-07-11 DIAGNOSIS — Z79899 Other long term (current) drug therapy: Secondary | ICD-10-CM | POA: Diagnosis not present

## 2022-07-11 DIAGNOSIS — K648 Other hemorrhoids: Secondary | ICD-10-CM | POA: Diagnosis not present

## 2022-07-11 DIAGNOSIS — R188 Other ascites: Secondary | ICD-10-CM | POA: Diagnosis not present

## 2022-07-11 DIAGNOSIS — I272 Pulmonary hypertension, unspecified: Secondary | ICD-10-CM | POA: Diagnosis not present

## 2022-07-11 DIAGNOSIS — Z9049 Acquired absence of other specified parts of digestive tract: Secondary | ICD-10-CM | POA: Diagnosis not present

## 2022-07-11 DIAGNOSIS — Z86010 Personal history of colonic polyps: Secondary | ICD-10-CM | POA: Diagnosis not present

## 2022-07-11 DIAGNOSIS — E1122 Type 2 diabetes mellitus with diabetic chronic kidney disease: Secondary | ICD-10-CM | POA: Diagnosis not present

## 2022-07-11 DIAGNOSIS — Z79890 Hormone replacement therapy (postmenopausal): Secondary | ICD-10-CM | POA: Diagnosis not present

## 2022-07-11 DIAGNOSIS — D61818 Other pancytopenia: Secondary | ICD-10-CM | POA: Diagnosis not present

## 2022-07-11 DIAGNOSIS — Z8249 Family history of ischemic heart disease and other diseases of the circulatory system: Secondary | ICD-10-CM | POA: Diagnosis not present

## 2022-07-11 LAB — CBC WITH DIFFERENTIAL/PLATELET
Abs Immature Granulocytes: 0.01 10*3/uL (ref 0.00–0.07)
Basophils Absolute: 0 10*3/uL (ref 0.0–0.1)
Basophils Relative: 1 %
Eosinophils Absolute: 0.3 10*3/uL (ref 0.0–0.5)
Eosinophils Relative: 8 %
HCT: 32.2 % — ABNORMAL LOW (ref 39.0–52.0)
Hemoglobin: 10.3 g/dL — ABNORMAL LOW (ref 13.0–17.0)
Immature Granulocytes: 0 %
Lymphocytes Relative: 22 %
Lymphs Abs: 0.8 10*3/uL (ref 0.7–4.0)
MCH: 29 pg (ref 26.0–34.0)
MCHC: 32 g/dL (ref 30.0–36.0)
MCV: 90.7 fL (ref 80.0–100.0)
Monocytes Absolute: 0.5 10*3/uL (ref 0.1–1.0)
Monocytes Relative: 12 %
Neutro Abs: 2.1 10*3/uL (ref 1.7–7.7)
Neutrophils Relative %: 57 %
Platelets: 60 10*3/uL — ABNORMAL LOW (ref 150–400)
RBC: 3.55 MIL/uL — ABNORMAL LOW (ref 4.22–5.81)
RDW: 17.2 % — ABNORMAL HIGH (ref 11.5–15.5)
WBC: 3.7 10*3/uL — ABNORMAL LOW (ref 4.0–10.5)
nRBC: 0 % (ref 0.0–0.2)

## 2022-07-11 LAB — VITAMIN B12: Vitamin B-12: 758 pg/mL (ref 180–914)

## 2022-07-11 LAB — COMPREHENSIVE METABOLIC PANEL
ALT: 17 U/L (ref 0–44)
AST: 42 U/L — ABNORMAL HIGH (ref 15–41)
Albumin: 3.2 g/dL — ABNORMAL LOW (ref 3.5–5.0)
Alkaline Phosphatase: 83 U/L (ref 38–126)
Anion gap: 8 (ref 5–15)
BUN: 19 mg/dL (ref 8–23)
CO2: 27 mmol/L (ref 22–32)
Calcium: 9 mg/dL (ref 8.9–10.3)
Chloride: 103 mmol/L (ref 98–111)
Creatinine, Ser: 1.39 mg/dL — ABNORMAL HIGH (ref 0.61–1.24)
GFR, Estimated: 57 mL/min — ABNORMAL LOW (ref >60)
Glucose, Bld: 116 mg/dL — ABNORMAL HIGH (ref 70–99)
Potassium: 4 mmol/L (ref 3.5–5.1)
Sodium: 138 mmol/L (ref 135–145)
Total Bilirubin: 4.8 mg/dL — ABNORMAL HIGH (ref 0.3–1.2)
Total Protein: 6.4 g/dL — ABNORMAL LOW (ref 6.5–8.1)

## 2022-07-11 LAB — TSH: TSH: 7.822 u[IU]/mL — ABNORMAL HIGH (ref 0.350–4.500)

## 2022-07-11 LAB — PROTIME-INR
INR: 1.6 — ABNORMAL HIGH (ref 0.8–1.2)
Prothrombin Time: 18.9 seconds — ABNORMAL HIGH (ref 11.4–15.2)

## 2022-07-11 LAB — MAGNESIUM: Magnesium: 2.2 mg/dL (ref 1.7–2.4)

## 2022-07-11 LAB — AMMONIA: Ammonia: 51 umol/L — ABNORMAL HIGH (ref 9–35)

## 2022-07-11 LAB — FOLATE: Folate: 9.7 ng/mL (ref >5.9)

## 2022-07-11 MED ORDER — RIFAXIMIN 550 MG PO TABS
550.0000 mg | ORAL_TABLET | Freq: Two times a day (BID) | ORAL | Status: DC
  Administered 2022-07-11 – 2022-07-13 (×6): 550 mg via ORAL
  Filled 2022-07-11 (×6): qty 1

## 2022-07-11 MED ORDER — LEVOTHYROXINE SODIUM 75 MCG PO TABS
175.0000 ug | ORAL_TABLET | Freq: Every day | ORAL | Status: DC
  Administered 2022-07-11: 175 ug via ORAL
  Filled 2022-07-11: qty 1

## 2022-07-11 MED ORDER — OXYCODONE HCL 5 MG PO TABS
5.0000 mg | ORAL_TABLET | ORAL | Status: DC | PRN
  Administered 2022-07-11 – 2022-07-13 (×5): 5 mg via ORAL
  Filled 2022-07-11 (×5): qty 1

## 2022-07-11 MED ORDER — PANTOPRAZOLE SODIUM 40 MG PO TBEC
40.0000 mg | DELAYED_RELEASE_TABLET | Freq: Every day | ORAL | Status: DC
  Administered 2022-07-11 – 2022-07-13 (×3): 40 mg via ORAL
  Filled 2022-07-11 (×3): qty 1

## 2022-07-11 MED ORDER — HEPARIN SODIUM (PORCINE) 5000 UNIT/ML IJ SOLN
5000.0000 [IU] | Freq: Three times a day (TID) | INTRAMUSCULAR | Status: DC
  Administered 2022-07-11: 5000 [IU] via SUBCUTANEOUS
  Filled 2022-07-11: qty 1

## 2022-07-11 MED ORDER — HYDROXYZINE HCL 25 MG PO TABS: 50.0000 mg | ORAL_TABLET | Freq: Three times a day (TID) | ORAL | Status: DC | PRN

## 2022-07-11 MED ORDER — HYDROXYZINE PAMOATE 50 MG PO CAPS: 50.0000 mg | ORAL_CAPSULE | Freq: Three times a day (TID) | ORAL | Status: DC | PRN

## 2022-07-11 MED ORDER — LEVOTHYROXINE SODIUM 100 MCG PO TABS
200.0000 ug | ORAL_TABLET | Freq: Every day | ORAL | Status: DC
  Administered 2022-07-12 – 2022-07-13 (×2): 200 ug via ORAL
  Filled 2022-07-11 (×2): qty 2

## 2022-07-11 MED ORDER — LACTULOSE 10 GM/15ML PO SOLN
20.0000 g | Freq: Two times a day (BID) | ORAL | Status: DC
  Administered 2022-07-11 – 2022-07-12 (×4): 20 g via ORAL
  Filled 2022-07-11 (×4): qty 30

## 2022-07-11 MED ORDER — ACETAMINOPHEN 650 MG RE SUPP: 650.0000 mg | Freq: Four times a day (QID) | RECTAL | Status: DC | PRN

## 2022-07-11 MED ORDER — TRAZODONE HCL 50 MG PO TABS
50.0000 mg | ORAL_TABLET | Freq: Every day | ORAL | Status: DC
  Administered 2022-07-11 – 2022-07-12 (×3): 50 mg via ORAL
  Filled 2022-07-11 (×3): qty 1

## 2022-07-11 MED ORDER — FUROSEMIDE 10 MG/ML IJ SOLN
40.0000 mg | Freq: Two times a day (BID) | INTRAMUSCULAR | Status: DC
  Administered 2022-07-11 – 2022-07-12 (×4): 40 mg via INTRAVENOUS
  Filled 2022-07-11 (×5): qty 4

## 2022-07-11 MED ORDER — ACETAMINOPHEN 325 MG PO TABS: 650.0000 mg | ORAL_TABLET | Freq: Four times a day (QID) | ORAL | Status: DC | PRN

## 2022-07-11 MED ORDER — ONDANSETRON HCL 4 MG/2ML IJ SOLN: 4.0000 mg | Freq: Four times a day (QID) | INTRAMUSCULAR | Status: DC | PRN

## 2022-07-11 MED ORDER — SERTRALINE HCL 50 MG PO TABS
50.0000 mg | ORAL_TABLET | Freq: Every day | ORAL | Status: DC
  Administered 2022-07-11 – 2022-07-12 (×3): 50 mg via ORAL
  Filled 2022-07-11 (×3): qty 1

## 2022-07-11 MED ORDER — SPIRONOLACTONE 25 MG PO TABS
100.0000 mg | ORAL_TABLET | Freq: Every day | ORAL | Status: DC
  Administered 2022-07-11 – 2022-07-13 (×3): 100 mg via ORAL
  Filled 2022-07-11 (×3): qty 4

## 2022-07-11 MED ORDER — ONDANSETRON HCL 4 MG PO TABS: 4.0000 mg | ORAL_TABLET | Freq: Four times a day (QID) | ORAL | Status: DC | PRN

## 2022-07-11 MED ORDER — ALBUTEROL SULFATE HFA 108 (90 BASE) MCG/ACT IN AERS: 2.0000 | INHALATION_SPRAY | Freq: Four times a day (QID) | RESPIRATORY_TRACT | Status: DC | PRN

## 2022-07-11 MED ORDER — CARVEDILOL 3.125 MG PO TABS
6.2500 mg | ORAL_TABLET | Freq: Two times a day (BID) | ORAL | Status: DC
  Administered 2022-07-11 – 2022-07-13 (×5): 6.25 mg via ORAL
  Filled 2022-07-11 (×5): qty 2

## 2022-07-11 MED ORDER — ALBUTEROL SULFATE (2.5 MG/3ML) 0.083% IN NEBU: 2.5000 mg | INHALATION_SOLUTION | Freq: Four times a day (QID) | RESPIRATORY_TRACT | Status: DC | PRN

## 2022-07-11 NOTE — Progress Notes (Addendum)
PROGRESS NOTE  Daniel Crawford ZOX:[id number] DOB: 1955-07-14 DOA: 07/10/2022 PCP: Elmer Picker [facility]  Brief History:  67 year old male with a history of diabetes mellitus type 2, hypertension, hypothyroidism, NASH liver cirrhosis presenting with altered mental status. The patient was recently mated to the hospital from 07/04/2022 to 07/08/2022 for decompensated liver cirrhosis and anasarca.  He was discharged home with torsemide 40 mg twice daily and spironolactone 100 mg daily.  His discharge weight was 173.2 kg. He had some generalized weakness at the time of discharge.  He states that he felt somewhat lethargic on 07/08/2021.  He remembers sitting in his recliner but did not remember much of anything else on that day.  Apparently, his friend tried to reach him by telephone but there was no answer.  He stated that the police subsequently came to check up on him, and he was noted to be confused.  He states that he did not take any of his diuretics or other medications on 07/09/2022 or 07/10/2022. He denies any fevers, chills, chest pain, cough, hemoptysis, nausea, vomiting, diarrhea.  In the ED, the patient was afebrile hemodynamically stable with oxygen saturation 97% on room air.  WBC 4.3, hemoglobin 10.9, platelets 68.  Sodium 138, potassium 4.0, bicarbonate 27, serum creatinine 1.39.  AST 42, ALT 17, alk phosphatase 83, total bilirubin 4.8.  Ammonia 77.  TSH 7.822.  UA was negative for pyuria.  The patient was given a dose of lactulose and restarted on rifaximin.   Assessment/Plan: Hepatic encephalopathy -07/11/2022--patient more alert but still slow with responses -Restart lactulose -Continue rifaximin -Ammonia 77>> 51 -Check B12 -TSH 7.822  Anasarca/liver cirrhosis with ascites -The patient remains fluid overloaded -Restart IV furosemide -Patient has had a propensity to take his diuretics only once daily at home -Continue spironolactone -Daily weights -Patient has  had a degree of dietary/fluid indiscretion contributing to his frequent decompensations -Low-sodium diet -Patient is not felt to be a TIPS or transplant candidate secondary to comorbidities -continue rifaximin -previously evaluated by [facility] for liver transplant, though not a candidate due to his BMI, multi-morbidities.   Hematochezia -suspect ano-rectal source -06/28/21 colonoscopy--small int and ext hemorrhoids, transverse colon polyps, sessile cecal polyp -GI consult  Pancytopenia -due to liver cirrhosis -monitor for signs of bleeding   Diabetes mellitus type 2 -not on any agents at home -check A1C--5.1   Essential hypertension Holding coreg temporarily to allow BP margin for diuresis>>restart after d/c   Hypothyroidism -TSH 7.822 -Increase Synthroid to 200 mcg daily  Depression/anxiety Continue Zoloft/hydroxyzine/trazodone   CKD 3a -baseline creatinine 1.1-1.4 -monitor with diuresis      Family Communication:  no Family at bedside  Consultants:  GI  Code Status:  FULL  DVT Prophylaxis:  SCDs   Procedures: As Listed in Progress Note Above  Antibiotics: None     Total time spent 50 minutes.  Greater than 50% spent face to face counseling and coordinating care.    Subjective: Patient denies fevers, chills, headache, chest pain, dyspnea, nausea, vomiting, diarrhea, abdominal pain, dysuria, hematuria, melena. He had some hematochezia overnight.  Objective: Vitals:   07/10/22 2330 07/11/22 0004 07/11/22 0454 07/11/22 0803  BP: 128/74 135/70 134/72 137/72  Pulse: 72 72 73 67  Resp: Temp:  97.8 F (36.6 C) 98 F (36.7 C) 98.1 F (36.7 C)  TempSrc:  Oral  Oral  SpO2: 96% 97% 97% 95%  Weight:  (!) 168.4 kg  Height:  6\' 2"  (1.88 m)      Intake/Output Summary (Last 24 hours) at 07/11/2022 0826 Last data filed at 07/11/2022 0452 Gross per 24 hour  Intake 240 ml  Output 200 ml  Net 40 ml   Weight change:  Exam:  General:  Pt is  alert, follows commands appropriately, not in acute distress HEENT: No icterus, No thrush, No neck mass, [place]/AT Cardiovascular: RRR, S1/S2, no rubs, no gallops Respiratory: CTA bilaterally, no wheezing, no crackles, no rhonchi Abdomen: Soft/+BS, non tender, non distended, no guarding Extremities: 1 + LE edema, No lymphangitis, No petechiae, No rashes, no synovitis   Data Reviewed: I have personally reviewed following labs and imaging studies Basic Metabolic Panel: Recent Labs  Lab 07/04/22 1734 07/05/22 0451 07/05/22 1521 07/06/22 0507 07/07/22 0512 07/08/22 0455 07/10/22 2119 07/11/22 0431  NA 133*   < >  --  134* 135 137 136 138  K 3.6   < >  --  3.6 3.8 3.9 3.9 4.0  CL 95*   < >  --  100 97* 95* 101 103  CO2 27   < >  --  30 32 33* 29 27  GLUCOSE 148*   < >  --  104* 115* 114* 121* 116*  BUN 13   < >  --  13 15 16 18 19   CREATININE 1.35*   < >  --  1.25* 1.41* 1.47* 1.51* 1.39*  CALCIUM 8.2*   < >  --  8.4* 8.6* 9.0 8.7* 9.0  MG 2.0  --  2.0  --   --   --   --  2.2   < > = values in this interval not displayed.   Liver Function Tests: Recent Labs  Lab 07/04/22 1734 07/05/22 0451 07/06/22 0507 07/10/22 2119 07/11/22 0431  AST 48* 37 37 46* 42*  ALT 19 17 17 20 17   ALKPHOS 111 91 89 94 83  BILITOT 4.3* 3.8* 3.7* 5.1* 4.8*  PROT 7.0 5.9* 6.5 6.7 6.4*  ALBUMIN 3.0* 2.5* 3.2* 3.2* 3.2*   No results for input(s): "LIPASE", "AMYLASE" in the last 168 hours. Recent Labs  Lab 07/10/22 2119 07/11/22 0431  AMMONIA 77* 51*   Coagulation Profile: Recent Labs  Lab 07/06/22 0507  INR 1.6*   CBC: Recent Labs  Lab 07/04/22 1734 07/05/22 0451 07/06/22 0507 07/10/22 2119 07/11/22 0431  WBC 5.6 4.1 3.1* 4.3 3.7*  NEUTROABS 4.0  --  1.7  --  2.1  HGB 11.9* 10.3* 10.6* 10.9* 10.3*  HCT 36.4* 31.6* 33.4* 33.5* 32.2*  MCV 90.3 89.0 90.8 90.3 90.7  PLT 85* 74* 69* 68* 60*   Cardiac Enzymes: No results for input(s): "CKTOTAL", "CKMB", "CKMBINDEX", "TROPONINI" in the  last 168 hours. BNP: Invalid input(s): "POCBNP" CBG: Recent Labs  Lab 07/05/22 1116 07/08/22 0011 07/08/22 0610 07/08/22 1110 07/10/22 2107  GLUCAP 139* 129* 198* 133* 125*   HbA1C: No results for input(s): "HGBA1C" in the last 72 hours. Urine analysis:    Component Value Date/Time   COLORURINE AMBER (A) 07/10/2022 2103   APPEARANCEUR CLEAR 07/10/2022 2103   LABSPEC 1.021 07/10/2022 2103   PHURINE 7.0 07/10/2022 2103   GLUCOSEU NEGATIVE 07/10/2022 2103   HGBUR SMALL (A) 07/10/2022 2103   BILIRUBINUR SMALL (A) 07/10/2022 2103   KETONESUR NEGATIVE 07/10/2022 2103   PROTEINUR 30 (A) 07/10/2022 2103   NITRITE NEGATIVE 07/10/2022 2103   LEUKOCYTESUR NEGATIVE 07/10/2022 2103   Sepsis Labs: @LABRCNTIP (procalcitonin:4,lacticidven:4) )No results found for  this or any previous visit (from the past 240 hour(s)).   Scheduled Meds:  carvedilol  6.25 mg Oral BID WC   heparin  5,000 Units Subcutaneous Q8H   levothyroxine  175 mcg Oral Q0600   pantoprazole  40 mg Oral Daily   rifaximin  550 mg Oral BID   sertraline  50 mg Oral QHS   spironolactone  100 mg Oral Daily   traZODone  50 mg Oral QHS   Continuous Infusions:  Procedures/Studies: ECHOCARDIOGRAM COMPLETE  Result Date: 07/07/2022    ECHOCARDIOGRAM REPORT   Patient Name:   Daniel Crawford    Date of Exam: 07/07/2022 Medical Rec #:  [id number]  Height:       74.0 in Accession #:    [id number] Weight:       388.9 lb Date of Birth:  [birth year] BSA:          2.883 m Patient Age:    66 years   BP:           156/82 mmHg Patient Gender: M          HR:           86 bpm. [facility] Procedure: 2D Echo, Cardiac Doppler and Color Doppler Indications:    Dyspnea R06.00  History:        Patient has prior history of Echocardiogram examinations, most                 recent 08/31/2020. CHF; Risk Factors:Hypertension and Diabetes.                 NASH (nonalcoholic steatohepatitis) (From Hx).  Sonographer:    Celesta Gentile RCS Referring  Phys: [id number] MAURICIO DANIEL ARRIEN IMPRESSIONS  1. Left ventricular ejection fraction, by estimation, is 55%. The left ventricle has normal function. The left ventricle has no regional wall motion abnormalities. There is mild left ventricular hypertrophy. Left ventricular diastolic parameters were normal.  2. Right ventricular systolic function is normal. The right ventricular size is mildly enlarged. Tricuspid regurgitation signal is inadequate for assessing PA pressure.  3. Left atrial size was mildly dilated.  4. Right atrial size was mildly dilated.  5. The mitral valve is normal in structure. Trivial mitral valve regurgitation. No evidence of mitral stenosis.  6. The aortic valve is grossly normal. Aortic valve regurgitation is not visualized. No aortic stenosis is present.  7. The inferior vena cava is dilated in size with >50% respiratory variability, suggesting right atrial pressure of 8 mmHg. FINDINGS  Left Ventricle: Left ventricular ejection fraction, by estimation, is 55%. The left ventricle has normal function. The left ventricle has no regional wall motion abnormalities. The left ventricular internal cavity size was normal in size. There is mild left ventricular hypertrophy. Left ventricular diastolic parameters were normal. Right Ventricle: The right ventricular size is mildly enlarged. No increase in right ventricular wall thickness. Right ventricular systolic function is normal. Tricuspid regurgitation signal is inadequate for assessing PA pressure. Left Atrium: Left atrial size was mildly dilated. Right Atrium: Right atrial size was mildly dilated. Pericardium: There is no evidence of pericardial effusion. Mitral Valve: The mitral valve is normal in structure. Mild mitral annular calcification. Trivial mitral valve regurgitation. No evidence of mitral valve stenosis. Tricuspid Valve: The tricuspid valve is normal in structure. Tricuspid valve regurgitation is trivial. No evidence of tricuspid  stenosis. Aortic Valve: The aortic valve is grossly normal. Aortic valve regurgitation is not visualized. No aortic stenosis is  present. Pulmonic Valve: The pulmonic valve was normal in structure. Pulmonic valve regurgitation is not visualized. No evidence of pulmonic stenosis. Aorta: The aortic root is normal in size and structure. Ascending aorta measurements are within normal limits for age when indexed to body surface area. Venous: The inferior vena cava is dilated in size with greater than 50% respiratory variability, suggesting right atrial pressure of 8 mmHg. IAS/Shunts: The interatrial septum was not well visualized.  LEFT VENTRICLE PLAX 2D LVIDd:         5.40 cm   Diastology LVIDs:         3.60 cm   LV e' medial:    10.10 cm/s LV PW:         1.20 cm   LV E/e' medial:  9.8 LV IVS:        1.20 cm   LV e' lateral:   14.90 cm/s LVOT diam:     1.90 cm   LV E/e' lateral: 6.7 LV SV:         84 LV SV Index:   29 LVOT Area:     2.84 cm  RIGHT VENTRICLE RV S prime:     14.90 cm/s TAPSE (M-mode): 3.7 cm LEFT ATRIUM              Index        RIGHT ATRIUM           Index LA diam:        4.60 cm  1.60 cm/m   RA Area:     35.20 cm LA Vol (A2C):   133.0 ml 46.13 ml/m  RA Volume:   137.00 ml 47.51 ml/m LA Vol (A4C):   107.0 ml 37.11 ml/m LA Biplane Vol: 120.0 ml 41.62 ml/m  AORTIC VALVE LVOT Vmax:   107.00 cm/s LVOT Vmean:  74.800 cm/s LVOT VTI:    0.296 m  AORTA Ao Root diam: 3.90 cm MITRAL VALVE MV Area (PHT): 4.06 cm    SHUNTS MV Decel Time: 187 msec    Systemic VTI:  0.30 m MV E velocity: 99.10 cm/s  Systemic Diam: 1.90 cm MV A velocity: 52.40 cm/s MV E/A ratio:  1.89 Weston Brass MD Electronically signed by Weston Brass MD Signature Date/Time: 07/07/2022/5:12:38 PM    Final    [place] ASCITES (ABDOMEN LIMITED)  Result Date: 07/05/2022 CLINICAL DATA:  History of cirrhosis EXAM: LIMITED ABDOMEN ULTRASOUND FOR ASCITES TECHNIQUE: Limited ultrasound survey for ascites was performed in all four abdominal  quadrants. COMPARISON:  None Available. FINDINGS: All 4 quadrants skin for ascites, trace amount of fluid noted. IMPRESSION: Trace ascites. Electronically Signed   By: Allegra Lai M.D.   On: 07/05/2022 17:15   DG Chest Port 1 View  Result Date: 07/04/2022 CLINICAL DATA:  Shortness of breath with bilateral leg swelling and abdominal swelling. EXAM: PORTABLE CHEST 1 VIEW COMPARISON:  May 21, 2022 FINDINGS: The heart size and mediastinal contours are within normal limits. Both lungs are clear. The visualized skeletal structures are unremarkable. IMPRESSION: No active disease. Electronically Signed   By: Aram Candela M.D.   On: 07/04/2022 17:56    Catarina Hartshorn, DO  [facility]  If 7PM-7AM, please contact night-coverage [email or web address] Password [email or web address] 07/11/2022, 8:26 AM   LOS: 0 days

## 2022-07-11 NOTE — Progress Notes (Signed)
Mobility Specialist Progress Note:   07/11/22 1153  Mobility  Activity Ambulated independently in room;Ambulated independently to bathroom  Level of Assistance Independent  Assistive Device None  Distance Ambulated (ft) 15 ft  Activity Response Tolerated well  Mobility Referral Yes  $Mobility charge 1 Mobility   Pt received in bathroom. Tolerated session well, had slight SOB ambulating back from bathroom. No AD required, pt was independent. Left pt in bed all needs met. Nurse notified of BM.   Feliciana Rossetti Mobility Specialist Please contact via Special educational needs teacher or  Rehab office at [phone]

## 2022-07-11 NOTE — Assessment & Plan Note (Signed)
-  Continue Coreg 

## 2022-07-11 NOTE — Assessment & Plan Note (Signed)
Continue Synthroid °

## 2022-07-11 NOTE — TOC Progression Note (Addendum)
Transition of Care [facility]) - Progression Note    Patient Details  Name: Harman Spurlock MRN: [id number] Date of Birth: 1956-01-17  Transition of Care [facility]) CM/SW Contact  Karn Cassis, [place] Phone Number: 07/11/2022, 10:47 AM  Clinical Narrative:  Transition of Care [facility]) Screening Note   Patient Details  Name: Sutter Votta Date of Birth: 22-Feb-1956   Transition of Care [facility]) CM/SW Contact:    Karn Cassis, LCSW Phone Number: 07/11/2022, 10:47 AM  Chelsea with [facility] reports pt is active with home hospice services. MD updated.   Transition of Care Department [facility]) has reviewed patient and no TOC needs have been identified at this time. We will continue to monitor patient advancement through interdisciplinary progression rounds. If new patient transition needs arise, please place a TOC consult.          Barriers to Discharge: Continued Medical Work up  Expected Discharge Plan and Services                                               Social Determinants of Health (SDOH) Interventions SDOH Screenings   Food Insecurity: No Food Insecurity (07/11/2022)  Housing: Low Risk  (07/11/2022)  Transportation Needs: No Transportation Needs (07/11/2022)  Utilities: Not At Risk (07/11/2022)  Depression (PHQ2-9): Medium Risk (10/27/2019)  Tobacco Use: Medium Risk (07/10/2022)    Readmission Risk Interventions    07/05/2022   12:16 PM 05/22/2022   10:18 AM  Readmission Risk Prevention Plan  Transportation Screening Complete Complete  Medication Review Oceanographer) Complete Complete  HRI or Home Care Consult Complete Complete  SW Recovery Care/Counseling Consult Complete Complete  Palliative Care Screening Not Applicable Not Applicable  Skilled Nursing Facility Not Applicable Not Applicable

## 2022-07-11 NOTE — Assessment & Plan Note (Signed)
-   Ammonia level 77 - Likely elevated in relation to recent aggressive diuresis for fluid overload - Lactulose given in the ED - Trend in the a.m. - History of cirrhosis secondary to [employer]

## 2022-07-11 NOTE — Assessment & Plan Note (Signed)
-   Continue trazodone, Zoloft, Vistaril

## 2022-07-11 NOTE — Assessment & Plan Note (Signed)
Continue Protonix °

## 2022-07-11 NOTE — Progress Notes (Signed)
At 12:00am: Patient admitted to floor 300. Patient settled in bed. All safety measures in place. Belongings and call bell within reach.   Patient called nurse to bedside saying that he had a BM in the toilet and there was blood. Checked toilet for stool, no stool observed, only toilet paper with small smear/streaks of blood. Asked patient to let nurse know the next time he has to have a BM so nurse can see stool to assess for the presence of blood. Pt. States that he has a history of hemorrhoids a while back, but he isn't sure if he still has them. MD made aware, no new orders at this time. Will continue to monitor and notify oncoming shift. No further concerns or complaints verbalized from patient.

## 2022-07-11 NOTE — Consult Note (Signed)
Gastroenterology Consult   Referring Provider: No ref. provider found Primary Care Physician:  [facility] Primary Gastroenterologist:  Dr.Castaneda   Patient ID: Levent Kornegay [id number]; 08-Oct-1955   Admit date: 07/10/2022  LOS: 0 days   Date of Consultation: 07/11/2022  Reason for Consultation:  hepatic encephalopathy and hematochezia   History of Present Illness   Dencil Lemoine is a 67 y.o. year old male with history of decompensated Elita Boone cirrhosis w/HE and anasarca, currently not a TIPS candidate (seen by IR 01/2022 and felt no indication for TIPs and would likely worsen pulmonary HTN and anasarca felt to be multifactorial from cardiac/renal/hepatic etiologies) or a liver transplant candidate (previously evaluated at [facility] liver transplant clinic 2021 and not felt to be a candidate due to low MELD at that time and BMI >40), chronically elevated AFP, diabetes, hypertension, hypothyroidism, diastolic heart failure, who presented to the ED for confusion, notably, he was recently admitted last week (d/c on 4/14) with edema and shortness of breath, also with acute anasarca, d/ced with aldactone 150mg , torsemide 40mg  BID. Ammonia in ED last night 77, Patient admitted for HE, also with hematochezia, GI consulted for further evaluation   ED Course: Creatnine 1.51, calcium 8.7 Albumin 3.2, AST 46, T bili 5.1  Ammonia 77  Hgb 10.9, plt count 68k   Consult:  States he thinks he got confused on Monday, he does not recall any events of Tuesday. Patient is alert and oriented x4. He reports some confusion in the past once before. Noting he felt a little foggy when he left the hospital Sunday which seemed to get worse on Monday and Tuesday. He had 1 BM this morning. He had some BRB on toilet tissue that began last night. He reports occasional constipation at home. Notes some right sided, mid abdominal pain on occasion, this is chronic. No nausea or vomiting. Appetite has not been  great.   Feels his Swelling has drastically improved since his last admission, he did not take his medications Monday or Tuesday due to confusion. No Shortness of breath.   Last EGD: 11/06/2019 - Normal esophagus. Dilated. - Portal hypertensive gastropathy. - Normal examined duodenum. - No specimens collected.   Last Colonoscopy:06/28/2021  - One medium size sessile polyp in the cecum. This polyp could not be removed because of poor approach. - Five 4 to 7 mm polyps in the transverse colon and at the hepatic flexure, removed with a cold snare. Resected and retrieved. - External and internal hemorrhoids.   Path: All polyps were tubular adenomas   Past Medical History:  Diagnosis Date   Anemia    Anxiety    Asthma    CHF (congestive heart failure)    CKD (chronic kidney disease)    Depression    Diabetes mellitus without complication    Dyspnea    Hypertension    Hypothyroidism    Liver cirrhosis secondary to NASH    NASH (nonalcoholic steatohepatitis)    Pre-diabetes    Spleen enlarged    Thrombocytopenia     Past Surgical History:  Procedure Laterality Date   Bilateral hernia surgery     2006, 2007   CHOLECYSTECTOMY     20 16    COLONOSCOPY WITH PROPOFOL N/A 06/28/2021   Procedure: COLONOSCOPY WITH PROPOFOL;  Surgeon: Malissa Hippo, MD;  [facility];  Service: Endoscopy;  Laterality: N/A;  1020   ESOPHAGEAL DILATION N/A 11/06/2019   Procedure: ESOPHAGEAL DILATION;  Surgeon: Dolores Frame, MD;  Location: [facility];  Service: Gastroenterology;  Laterality: N/A;   ESOPHAGOGASTRODUODENOSCOPY (EGD) WITH PROPOFOL N/A 11/06/2019   Procedure: ESOPHAGOGASTRODUODENOSCOPY (EGD) WITH PROPOFOL;  Surgeon: Dolores Frame, MD;  [facility]: [facility];  Service: Gastroenterology;  Laterality: N/A;  1045   IR RADIOLOGIST EVAL & MGMT  02/19/2022   LAPAROSCOPIC ASSISTED VENTRAL HERNIA REPAIR     Polyp removed     in January of 2018.     POLYPECTOMY  06/28/2021   Procedure: POLYPECTOMY INTESTINAL;  Surgeon: Malissa Hippo, MD;  Location: [facility];  Service: Endoscopy;;   RIGHT HEART CATH N/A 01/08/2022   Procedure: RIGHT HEART CATH;  Surgeon: Corky Crafts, MD;  Location: [facility] INVASIVE CV LAB;  Service: Cardiovascular;  Laterality: N/A;    Prior to Admission medications   Medication Sig Start Date End Date Taking? Authorizing Provider  acetaminophen (TYLENOL) 500 MG tablet Take 1,000 mg by mouth every 6 (six) hours as needed for moderate pain.    [provider]  albuterol (VENTOLIN HFA) 108 (90 Base) MCG/ACT inhaler Inhale 2 puffs into the lungs every 6 (six) hours as needed for wheezing or shortness of breath. 05/01/22   Oretha Milch, MD  carvedilol (COREG) 6.25 MG tablet Take 1 tablet (6.25 mg total) by mouth 2 (two) times daily with a meal. 01/25/22   Emokpae, Courage, MD  gabapentin (NEURONTIN) 100 MG capsule Take 100 mg by mouth daily as needed (pain). 02/20/21   [provider]  hydrOXYzine (VISTARIL) 50 MG capsule Take 1 capsule (50 mg total) by mouth 3 (three) times daily as needed for anxiety or nausea. 01/25/22   Shon Hale, MD  lactose free nutrition (BOOST) LIQD Take 237 mLs by mouth every evening.    [provider]  levothyroxine (SYNTHROID) 175 MCG tablet Take 175 mcg by mouth daily before breakfast.    [provider]  ondansetron (ZOFRAN) 8 MG tablet Take by mouth every 8 (eight) hours as needed for nausea or vomiting.    [provider]  pantoprazole (PROTONIX) 40 MG tablet Take 1 tablet (40 mg total) by mouth daily. 01/25/22   Shon Hale, MD  potassium chloride SA (KLOR-CON M) 20 MEQ tablet Take 2 tablets (40 mEq total) by mouth daily. Patient taking differently: Take 40 mEq by mouth 2 (two) times daily. 03/05/22   Johnson, Clanford L, MD  rifaximin (XIFAXAN) 550 MG TABS tablet Take 1 tablet (550 mg total) by mouth 2 (two) times daily. 01/25/22    Shon Hale, MD  sertraline (ZOLOFT) 50 MG tablet Take 1 tablet (50 mg total) by mouth at bedtime. 01/25/22   Shon Hale, MD  spironolactone (ALDACTONE) 100 MG tablet Take 1 tablet (100 mg total) by mouth daily. 03/04/22   Johnson, Clanford L, MD  torsemide 40 MG TABS Take 40 mg by mouth 2 (two) times daily. Patient taking differently: Take 40 mg by mouth daily. 04/10/22   Catarina Hartshorn, MD  traZODone (DESYREL) 50 MG tablet Take 1 tablet (50 mg total) by mouth at bedtime. 01/25/22   Shon Hale, MD    Current Facility-Administered Medications  Medication Dose Route Frequency Provider Last Rate Last Admin   acetaminophen (TYLENOL) tablet 650 mg  650 mg Oral Q6H PRN Zierle-Ghosh, Asia B, DO       Or   acetaminophen (TYLENOL) suppository 650 mg  650 mg Rectal Q6H PRN Zierle-Ghosh, Asia B, DO       albuterol (PROVENTIL) (2.5 MG/3ML) 0.083% nebulizer solution  2.5 mg  2.5 mg Nebulization Q6H PRN Zierle-Ghosh, Asia B, DO       carvedilol (COREG) tablet 6.25 mg  6.25 mg Oral BID WC Zierle-Ghosh, Asia B, DO   6.25 mg at 07/11/22 0805   furosemide (LASIX) injection 40 mg  40 mg Intravenous BID Tat, Onalee Hua, MD   40 mg at 07/11/22 0936   hydrOXYzine (ATARAX) tablet 50 mg  50 mg Oral TID PRN Zierle-Ghosh, Asia B, DO       lactulose (CHRONULAC) 10 GM/15ML solution 20 g  20 g Oral BID Tat, Onalee Hua, MD   20 g at 07/11/22 1050   [START ON 07/12/2022] levothyroxine (SYNTHROID) tablet 200 mcg  200 mcg Oral Q0600 Tat, Onalee Hua, MD       ondansetron [facility]) tablet 4 mg  4 mg Oral Q6H PRN Zierle-Ghosh, Asia B, DO       Or   ondansetron (ZOFRAN) injection 4 mg  4 mg Intravenous Q6H PRN Zierle-Ghosh, Asia B, DO       oxyCODONE (Oxy IR/ROXICODONE) immediate release tablet 5 mg  5 mg Oral Q4H PRN Zierle-Ghosh, Asia B, DO   5 mg at 07/11/22 0521   pantoprazole (PROTONIX) EC tablet 40 mg  40 mg Oral Daily Zierle-Ghosh, Asia B, DO   40 mg at 07/11/22 0805   rifaximin (XIFAXAN) tablet 550 mg  550 mg Oral BID  Zierle-Ghosh, Asia B, DO   550 mg at 07/11/22 0805   sertraline (ZOLOFT) tablet 50 mg  50 mg Oral QHS Zierle-Ghosh, Asia B, DO   50 mg at 07/11/22 0109   spironolactone (ALDACTONE) tablet 100 mg  100 mg Oral Daily Zierle-Ghosh, Asia B, DO   100 mg at 07/11/22 0805   traZODone (DESYREL) tablet 50 mg  50 mg Oral QHS Zierle-Ghosh, Asia B, DO   50 mg at 07/11/22 0109    Allergies as of 07/10/2022   (No Known Allergies)    Family History  Problem Relation Age of Onset   Liver cancer Mother    Heart disease Mother    Aneurysm Sister     Social History   Socioeconomic History   Marital status: Single    Spouse name: Not on file   Number of children: Not on file   Years of education: Not on file   Highest education level: Not on file  Occupational History   Not on file  Tobacco Use   Smoking status: Former    Packs/day: 1.00    Years: 20.00    Additional pack years: 0.00    Total pack years: 20.00    Types: Cigarettes    Quit date: 2017    Years since quitting: 7.2   Smokeless tobacco: Never  Vaping Use   Vaping Use: Never used  Substance and Sexual Activity   Alcohol use: No   Drug use: No   Sexual activity: Not on file  Other Topics Concern   Not on file  Social History Narrative   Not on file   Social Determinants of Health   Financial Resource Strain: Not on file  Food Insecurity: No Food Insecurity (07/11/2022)   Hunger Vital Sign    Worried About Running Out of Food in the Last Year: Never true    Ran Out of Food in the Last Year: Never true  Transportation Needs: No Transportation Needs (07/11/2022)   PRAPARE - Administrator, Civil Service (Medical): No    Lack of Transportation (Non-Medical): No  Physical  Activity: Not on file  Stress: Not on file  Social Connections: Not on file  Intimate Partner Violence: Not At Risk (07/11/2022)   Humiliation, Afraid, Rape, and Kick questionnaire    Fear of Current or Ex-Partner: No    Emotionally Abused:  No    Physically Abused: No    Sexually Abused: No     Review of Systems   Gen: Denies any fever, chills, loss of appetite, change in weight or weight loss CV: Denies chest pain, heart palpitations, syncope, +edema Resp: Denies shortness of breath with rest, cough, wheezing, coughing up blood, and pleurisy. GI: denies melena, nausea, vomiting, diarrhea, constipation, dysphagia, odyonophagia, early satiety or weight loss. +toilet tissue hematochezia  GU : Denies urinary burning, blood in urine, urinary frequency, and urinary incontinence. MS: Denies joint pain, limitation of movement, swelling, cramps, and atrophy.  Derm: Denies rash, itching, dry skin, hives. Psych: Denies depression, anxiety, memory loss, hallucinations, and confusion. Heme: Denies bruising or bleeding Neuro:  Denies any headaches, dizziness, paresthesias, shaking  Physical Exam   Vital Signs in last 24 hours: Temp:  [97.8 F (36.6 C)-98.1 F (36.7 C)] 98.1 F (36.7 C) (04/17 0803) Pulse Rate:  [65-73] 67 (04/17 0803) Resp:  [15-20] 16 (04/17 0803) BP: (126-139)/(62-81) 137/72 (04/17 0803) SpO2:  [95 %-97 %] 95 % (04/17 0803) Weight:  [168.4 kg-175 kg] 168.4 kg (04/17 0004) Last BM Date : 07/11/22  General:   Alert,  Well-developed, well-nourished, pleasant and cooperative in NAD Head:  Normocephalic and atraumatic. Eyes:  Scleral icterus Ears:  Normal auditory acuity. Mouth:  No deformity or lesions, dentition normal. Neck:  Supple; no masses Lungs:  Clear throughout to auscultation.   No wheezes, crackles, or rhonchi. No acute distress. Heart:  Regular rate and rhythm; no murmurs, clicks, rubs,  or gallops. Abdomen:  Soft, nontender and nondistended. Very minimal ascites. No masses or hepatosplenomegaly. Normal bowel sounds, without guarding, and without rebound.   Msk:  Symmetrical without gross deformities. Normal posture. Extremities:  Mild pitting edema to LEs below the knee Neurologic:  Alert and  oriented x4. Skin:  mild jaundice  Psych:  Alert and cooperative. Normal mood and affect.  Intake/Output from previous day: 04/16 0701 - 04/17 0700 In: 240 [P.O.:240] Out: 200 [Urine:200] Intake/Output this shift: Total I/O In: 240 [P.O.:240] Out: -   Labs/Studies   Recent Labs Recent Labs    07/10/22 2119 07/11/22 0431  WBC 4.3 3.7*  HGB 10.9* 10.3*  HCT 33.5* 32.2*  PLT 68* 60*   BMET Recent Labs    07/10/22 2119 07/11/22 0431  NA 136 138  K 3.9 4.0  CL 101 103  CO2 29 27  GLUCOSE 121* 116*  BUN 18 19  CREATININE 1.51* 1.39*  CALCIUM 8.7* 9.0   LFT Recent Labs    07/10/22 2119 07/11/22 0431  PROT 6.7 6.4*  ALBUMIN 3.2* 3.2*  AST 46* 42*  ALT 20 17  ALKPHOS 94 83  BILITOT 5.1* 4.8*    Assessment   Cole Goodie is a 67 y.o. year old male with history of decompensated Nash cirrhosis w/HE and anasarca, with recent hospital admission for anasarca, discharged on 4/14, who presented to the ED for confusion. Ammonia in ED last night 77, Patient admitted for HE and GI consulted for further evaluation.  Decompensated cirrhosis: MELD 3.0 21.  he is not a candidate for TIPS or liver transplant due to multimorbidities and BMI. Liver disease likely contributing to his anasarca. Last EGD in  2021, he is due for repeat EV screening which can be done outpatient once more clinically stable.   AFP has been chronically elevated, last was 9 on 05/17/22.   Fluid overload from last admission has improved quite a bit, only minimal ascites, mild pitting edema in LEs. Weight 371lbs today, down from 394 lbs on 4/12. Creatnine Is stable at 1.39, sodium and potassium WNL. He was started on IV furosemide 40mg  BID and aldactone 100mg  on admission. Urine output today is so far. Should continue with diuretic therapy, close monitoring of electrolytes and renal function. He Has not required paracentesis in the past.   Hepatic encephalopathy: reported lethargy with some confusion  beginning on 4/15,  does not recall events of yesterday. ammonia 77 on admission, 51 this morning. HE Likely secondary to increase diuretic therapy. Currently receiving lactulose 20mg  BID and Xifaxan 550mg  BID. Mental status appears to be improving with lactulose and Xifaxan. He is a/ox4 on my exam today. Will continue with current lactulose and xifaxan dosing.    Hematochezia/colon polyp:  recent colonoscopy with multiple polyps, internal and external hemorrhoids, one polyp in cecum that could not be removed. Toilet tissue hematochezia began last night, likely secondary to known hemorrhoids. Hgb 10.3 today, baseline is 10-11 range. Will need repeat Colonoscopy once he is more euvolemic.   Plan / Recommendations   Continue lactulose 20g BID Continue xifaxan 550mg  BID Continue diuretics, close monitoring of renal function/electrolytes  Trend h&h, monitor for overt GI bleeding 2g sodium diet Daily weights and strict I/Os Outpatient EGD/Colonoscopy once more clinically stable 8. Daily MELD labs    07/11/2022, 11:21 AM  Stevens Magwood L. Jeanmarie Hubert, MSN, APRN, AGNP-C Adult-Gerontology Nurse Practitioner [facility] [place]

## 2022-07-11 NOTE — Assessment & Plan Note (Signed)
-   Continue rifaximin, Aldactone - Ammonia elevated - Lactulose given in the ED - Trend ammonia in the a.m. - Continue to monitor

## 2022-07-11 NOTE — Hospital Course (Signed)
67 year old male with a history of diabetes mellitus type 2, hypertension, hypothyroidism, NASH liver cirrhosis presenting with altered mental status. The patient was recently mated to the hospital from 07/04/2022 to 07/08/2022 for decompensated liver cirrhosis and anasarca.  He was discharged home with torsemide 40 mg twice daily and spironolactone 100 mg daily.  His discharge weight was 173.2 kg. He had some generalized weakness at the time of discharge.  He states that he felt somewhat lethargic on 07/08/2021.  He remembers sitting in his recliner but did not remember much of anything else on that day.  Apparently, his friend tried to reach him by telephone but there was no answer.  He stated that the police subsequently came to check up on him, and he was noted to be confused.  He states that he did not take any of his diuretics or other medications on 07/09/2022 or 07/10/2022. He denies any fevers, chills, chest pain, cough, hemoptysis, nausea, vomiting, diarrhea.  In the ED, the patient was afebrile hemodynamically stable with oxygen saturation 97% on room air.  WBC 4.3, hemoglobin 10.9, platelets 68.  Sodium 138, potassium 4.0, bicarbonate 27, serum creatinine 1.39.  AST 42, ALT 17, alk phosphatase 83, total bilirubin 4.8.  Ammonia 77.  TSH 7.822.  UA was negative for pyuria.  The patient was given a dose of lactulose and restarted on rifaximin.

## 2022-07-11 NOTE — TOC Initial Note (Signed)
Transition of Care [facility]) - Initial/Assessment Note    Patient Details  Name: Daniel Crawford MRN: [id number] Date of Birth: [birth year]  Transition of Care [facility] CM/SW Contact:    Karn Cassis, LCSW Phone Number: 07/11/2022, 12:26 PM  Clinical Narrative:  Pt admitted for decompensated liver cirrhosis with anasarca. Assessment completed due to high risk readmission score. Pt reports he lives alone and is independent with ADLs. He drives himself to appointments. [place] with [facility] reports pt is active with home hospice. MD notified. Pt plans to return home at d/c. TOC will continue to follow.                    Expected Discharge Plan: Home w Hospice Care Barriers to Discharge: Continued Medical Work up   Patient Goals and CMS Choice Patient states their goals for this hospitalization and ongoing recovery are:: return home   Choice offered to / list presented to : Patient [place] ownership interest in [facility].provided to::  (n/a)    Expected Discharge Plan and Services In-house Referral: Clinical Social Work   Post Acute Care Choice: Resumption of Svcs/PTA Provider Living arrangements for the past 2 months: Single Family Home                                      Prior Living Arrangements/Services Living arrangements for the past 2 months: Single Family Home Lives with:: Self Patient language and need for interpreter reviewed:: Yes Do you feel safe going back to the place where you live?: Yes            Criminal Activity/Legal Involvement Pertinent to Current Situation/Hospitalization: No - Comment as needed  Activities of Daily Living Home Assistive Devices/Equipment: Shower chair with back ADL Screening (condition at time of admission) Patient's cognitive ability adequate to safely complete daily activities?: Yes Is the patient deaf or have difficulty hearing?: No Does the patient have difficulty seeing, even when wearing glasses/contacts?:  No Does the patient have difficulty concentrating, remembering, or making decisions?: No Patient able to express need for assistance with ADLs?: Yes Does the patient have difficulty dressing or bathing?: Yes Independently performs ADLs?: Yes (appropriate for developmental age) Does the patient have difficulty walking or climbing stairs?: No Weakness of Legs: Both Weakness of Arms/Hands: Both  Permission Sought/Granted                  Emotional Assessment     Affect (typically observed): Appropriate Orientation: : Oriented to Self, Oriented to  Time, Oriented to Place, Oriented to Situation Alcohol / Substance Use: Not Applicable Psych Involvement: No (comment)  Admission diagnosis:  Hepatic encephalopathy [K76.82] Thrombocytopenia [D69.6] Renal insufficiency [N28.9] Normochromic normocytic anemia [D64.9] Patient Active Problem List   Diagnosis Date Noted   Hepatic encephalopathy 07/10/2022   T2DM (type 2 diabetes mellitus) 07/06/2022   CKD stage 3a, GFR 45-59 ml/min 07/06/2022   Decompensated liver disease 07/04/2022   Morbid obesity 05/25/2022   Decompensation of cirrhosis of liver 05/21/2022   Pancytopenia 05/21/2022   Acute exacerbation of CHF (congestive heart failure) 04/06/2022   Liver cirrhosis secondary to NASH 03/13/2022   Iron deficiency anemia 02/28/2022   Hypoalbuminemia due to protein-calorie malnutrition 02/28/2022   GERD (gastroesophageal reflux disease) 02/28/2022   Abdominal pain 02/28/2022   Anasarca 01/23/2022   Generalized weakness 01/23/2022   Essential hypertension 01/23/2022   Volume overload 01/23/2022  Hypervolemia    Elevated TSH 01/22/2022   D-dimer, elevated 01/22/2022   Protein calorie malnutrition 01/22/2022   Chronic diastolic heart failure    Hypogonadism, male 12/26/2021   History of colonic polyps 07/27/2021   Visual hallucination 07/27/2021   Auditory hallucinations 07/27/2021   Leukopenia 04/19/2021   Hypokalemia  04/18/2021   Dyspnea on exertion 01/20/2020   OSA (obstructive sleep apnea) 01/20/2020   History of ascites 10/22/2019   Acquired hypothyroidism 10/16/2016   Cirrhosis of liver with ascites 10/16/2016   Depression with anxiety 10/16/2016   Thrombocytopenia 10/16/2016   Spleen enlarged 10/16/2016   PCP:  [facility], [facility]:   [id number] - [place], [place] - [address] [facility] [address] [place] [address] Phone: [phone] Fax: [phone]     Social Determinants of Health (SDOH) Social History: SDOH Screenings   Food Insecurity: No Food Insecurity (07/11/2022)  Housing: Low Risk  (07/11/2022)  Transportation Needs: No Transportation Needs (07/11/2022)  Utilities: Not At Risk (07/11/2022)  Depression (PHQ2-9): Medium Risk (10/27/2019)  Tobacco Use: Medium Risk (07/10/2022)   SDOH Interventions:     Readmission Risk Interventions    07/11/2022   12:24 PM 07/05/2022   12:16 PM 05/22/2022   10:18 AM  Readmission Risk Prevention Plan  Transportation Screening Complete Complete Complete  Medication Review Ocean[employer]) Complete Complete Complete  HRI or Home Care Consult Complete Complete Complete  SW Recovery Care/Counseling Consult Complete Complete Complete  Palliative Care Screening  Not Applicable Not Applicable  Skilled Nursing Facility Not Applicable Not Applicable Not Applicable

## 2022-07-11 NOTE — H&P (Signed)
History and Physical    Patient: Daniel Crawford ZOX:[id number] DOB: [birth year] DOA: 07/10/2022 DOS: the patient was seen and examined on 07/11/2022 PCP: [facility]  Patient coming from: Home  Chief Complaint:  Chief Complaint  Patient presents with   Altered Mental Status   HPI: Daniel Crawford is a 67 y.o. male with medical history significant of anxiety, CHF, hypertension, hypothyroidism, NASH, diabetes mellitus without PTA hypoglycemics on med list, presents to ED for chief complaint of altered mental status.  Patient reports that he does not remember Tuesday at all.  He reports he was recently discharged from the hospital on Sunday, he seemed to be doing all right Monday, and then he does not remember Tuesday at all.  He thinks he [place] have been confused, but does not remember.  He does note that he sat in his chair all day.  He does not start remembering things again until in the ER when he asked somebody with days a week it was.  Patient denies dyspnea and chest pain.  Family had reported to ED provider that he had confusion since being discharged.  Patient was discharged on Sunday after a stay for fluid overload.  He had presented with lower extremity edema and dyspnea.  He reported progressively worsening orthopnea, fluid retention, edema at home.  He thinks he had a weight gain of 6 pounds at that time.  He had an echo during that time that showed preserved ejection fraction of 55%.  He was discharged home on his normal diuretics.  Patient reports he does not smoke, does not drink, does not use illicit drugs.  He is vaccinated for COVID and flu.  Patient is full code.  He reports he also has an H POA which have advised him to bring in that paperwork so he can put it in his chart as he has no ACP documentation. Review of Systems: As mentioned in the history of present illness. All other systems reviewed and are negative. Past Medical History:  Diagnosis Date   Anemia     Anxiety    Asthma    CHF (congestive heart failure)    CKD (chronic kidney disease)    Depression    Diabetes mellitus without complication    Dyspnea    Hypertension    Hypothyroidism    Liver cirrhosis secondary to NASH    NASH (nonalcoholic steatohepatitis)    Pre-diabetes    Spleen enlarged    Thrombocytopenia    Past Surgical History:  Procedure Laterality Date   Bilateral hernia surgery     2006, 2007   CHOLECYSTECTOMY     20 16    COLONOSCOPY WITH PROPOFOL N/A 06/28/2021   Procedure: COLONOSCOPY WITH PROPOFOL;  Surgeon: Malissa Hippo, MD;  [facility];  Service: Endoscopy;  Laterality: N/A;  1020   ESOPHAGEAL DILATION N/A 11/06/2019   Procedure: ESOPHAGEAL DILATION;  Surgeon: Dolores Frame, MD;  Location: [facility];  Service: Gastroenterology;  Laterality: N/A;   ESOPHAGOGASTRODUODENOSCOPY (EGD) WITH PROPOFOL N/A 11/06/2019   Procedure: ESOPHAGOGASTRODUODENOSCOPY (EGD) WITH PROPOFOL;  Surgeon: Dolores Frame, MD;  [facility];  Service: Gastroenterology;  Laterality: N/A;  1045   IR RADIOLOGIST EVAL & MGMT  02/19/2022   LAPAROSCOPIC ASSISTED VENTRAL HERNIA REPAIR     Polyp removed     in January of 2018.    POLYPECTOMY  06/28/2021   Procedure: POLYPECTOMY INTESTINAL;  Surgeon: Malissa Hippo, MD;  [facility]: [facility];  Service: Endoscopy;;   RIGHT HEART CATH N/A 01/08/2022   Procedure: RIGHT HEART CATH;  Surgeon: Corky Crafts, MD;  Location: [facility] INVASIVE CV LAB;  Service: Cardiovascular;  Laterality: N/A;   Social History:  reports that he quit smoking about 7 years ago. His smoking use included cigarettes. He has a 20.00 pack-year smoking history. He has never used smokeless tobacco. He reports that he does not drink alcohol and does not use drugs.  No Known Allergies  Family History  Problem Relation Age of Onset   Liver cancer Mother    Heart disease Mother    Aneurysm Sister     Prior to Admission  medications   Medication Sig Start Date End Date Taking? Authorizing Provider  acetaminophen (TYLENOL) 500 MG tablet Take 1,000 mg by mouth every 6 (six) hours as needed for moderate pain.    [provider]  albuterol (VENTOLIN HFA) 108 (90 Base) MCG/ACT inhaler Inhale 2 puffs into the lungs every 6 (six) hours as needed for wheezing or shortness of breath. 05/01/22   Oretha Milch, MD  carvedilol (COREG) 6.25 MG tablet Take 1 tablet (6.25 mg total) by mouth 2 (two) times daily with a meal. 01/25/22   Emokpae, Courage, MD  gabapentin (NEURONTIN) 100 MG capsule Take 100 mg by mouth daily as needed (pain). 02/20/21   [provider]  hydrOXYzine (VISTARIL) 50 MG capsule Take 1 capsule (50 mg total) by mouth 3 (three) times daily as needed for anxiety or nausea. 01/25/22   Shon Hale, MD  lactose free nutrition (BOOST) LIQD Take 237 mLs by mouth every evening.    [provider]  levothyroxine (SYNTHROID) 175 MCG tablet Take 175 mcg by mouth daily before breakfast.    [provider]  ondansetron (ZOFRAN) 8 MG tablet Take by mouth every 8 (eight) hours as needed for nausea or vomiting.    [provider]  pantoprazole (PROTONIX) 40 MG tablet Take 1 tablet (40 mg total) by mouth daily. 01/25/22   Shon Hale, MD  potassium chloride SA (KLOR-CON M) 20 MEQ tablet Take 2 tablets (40 mEq total) by mouth daily. Patient taking differently: Take 40 mEq by mouth 2 (two) times daily. 03/05/22   Johnson, Clanford L, MD  rifaximin (XIFAXAN) 550 MG TABS tablet Take 1 tablet (550 mg total) by mouth 2 (two) times daily. 01/25/22   Shon Hale, MD  sertraline (ZOLOFT) 50 MG tablet Take 1 tablet (50 mg total) by mouth at bedtime. 01/25/22   Shon Hale, MD  spironolactone (ALDACTONE) 100 MG tablet Take 1 tablet (100 mg total) by mouth daily. 03/04/22   Johnson, Clanford L, MD  torsemide 40 MG TABS Take 40 mg by mouth 2 (two) times daily. Patient taking  differently: Take 40 mg by mouth daily. 04/10/22   Catarina Hartshorn, MD  traZODone (DESYREL) 50 MG tablet Take 1 tablet (50 mg total) by mouth at bedtime. 01/25/22   Shon Hale, MD    Physical Exam: Vitals:   07/10/22 2200 07/10/22 2300 07/10/22 2330 07/11/22 0004  BP: 126/63 139/81 128/74 135/70  Pulse: 65 69 72 72  Resp: Temp:    97.8 F (36.6 C)  TempSrc:    Oral  SpO2: 97% 97% 96% 97%  Weight:    (!) 168.4 kg  Height:     (1.88 m)   1.  General: Patient lying supine in bed,  no acute distress   2. Psychiatric: Alert and  oriented x 3, mood and behavior normal for situation, pleasant and cooperative with exam   3. Neurologic: Speech and language are normal, face is symmetric, moves all 4 extremities voluntarily, at baseline without acute deficits on limited exam   4. HEENMT:  Head is atraumatic, normocephalic, pupils reactive to light, neck is supple, trachea is midline, mucous membranes are moist   5. Respiratory : Lungs are clear to auscultation bilaterally without wheezing, rhonchi, rales, no cyanosis, no increase in work of breathing or accessory muscle use   6. Cardiovascular : Heart rate normal, rhythm is regular, no murmurs, rubs or gallops, trace peripheral edema, peripheral pulses palpated   7. Gastrointestinal:  Abdomen is soft, nondistended, nontender to palpation bowel sounds active, no masses or organomegaly palpated   8. Skin:  Skin is warm, dry and intact without rashes, acute lesions, or ulcers on limited exam   9.Musculoskeletal:  No acute deformities or trauma, no asymmetry in tone, trace peripheral edema, peripheral pulses palpated, no tenderness to palpation in the extremities  Data Reviewed: In the ED Temp 98.1, heart rate 65-69, respiratory rate 15-20, blood pressure 126/62-139/81, satting at 96-97% on room air Borderline leukopenia with a white blood cell count of 4.3, thrombocytopenia with a platelet count of 68 Chemistry  reveals an elevated creatinine at 1.51-seems as though creatinine has steadily creeped up since April 10 UA is not negative UTI Albumin is slightly low at 3.2 days to be expected in cirrhosis patient Ammonia 77 EKG shows a heart rate of 68, sinus rhythm, QTc 466 Lactulose 30 g given in the ED Admission was requested for hepatic encephalopathy  Assessment and Plan: * Hepatic encephalopathy - Ammonia level 77 - Likely elevated in relation to recent aggressive diuresis for fluid overload - Lactulose given in the ED - Trend in the a.m. - History of cirrhosis secondary to [facility]  Liver cirrhosis secondary to NASH - Continue rifaximin, Aldactone - Ammonia elevated - Lactulose given in the ED - Trend ammonia in the a.m. - Continue to monitor  Acquired hypothyroidism - Continue Synthroid  Essential hypertension - Continue Coreg  GERD (gastroesophageal reflux disease) - Continue Protonix  Depression with anxiety - Continue trazodone, Zoloft, Vistaril      Advance Care Planning:   Code Status: Full Code  Consults: None at this time  Family Communication: No family at bedside  Severity of Illness: The appropriate patient status for this patient is OBSERVATION. Observation status is judged to be reasonable and necessary in order to provide the required intensity of service to ensure the patient's safety. The patient's presenting symptoms, physical exam findings, and initial radiographic and laboratory data in the context of their medical condition is felt to place them at decreased risk for further clinical deterioration. Furthermore, it is anticipated that the patient will be medically stable for discharge from the hospital within 2 midnights of admission.   Author: Lilyan Gilford, DO 07/11/2022 3:46 AM  For on call review [email or web address].

## 2022-07-12 ENCOUNTER — Telehealth: Payer: Self-pay | Admitting: Gastroenterology

## 2022-07-12 DIAGNOSIS — N1831 Chronic kidney disease, stage 3a: Secondary | ICD-10-CM | POA: Diagnosis not present

## 2022-07-12 DIAGNOSIS — K7460 Unspecified cirrhosis of liver: Secondary | ICD-10-CM | POA: Diagnosis not present

## 2022-07-12 DIAGNOSIS — K7581 Nonalcoholic steatohepatitis (NASH): Secondary | ICD-10-CM | POA: Diagnosis not present

## 2022-07-12 DIAGNOSIS — K7682 Hepatic encephalopathy: Secondary | ICD-10-CM | POA: Diagnosis not present

## 2022-07-12 DIAGNOSIS — D696 Thrombocytopenia, unspecified: Secondary | ICD-10-CM | POA: Diagnosis not present

## 2022-07-12 LAB — BASIC METABOLIC PANEL
Anion gap: 7 (ref 5–15)
BUN: 18 mg/dL (ref 8–23)
CO2: 30 mmol/L (ref 22–32)
Calcium: 8.7 mg/dL — ABNORMAL LOW (ref 8.9–10.3)
Chloride: 99 mmol/L (ref 98–111)
Creatinine, Ser: 1.25 mg/dL — ABNORMAL HIGH (ref 0.61–1.24)
GFR, Estimated: >60 mL/min (ref >60)
Glucose, Bld: 115 mg/dL — ABNORMAL HIGH (ref 70–99)
Potassium: 3.9 mmol/L (ref 3.5–5.1)
Sodium: 136 mmol/L (ref 135–145)

## 2022-07-12 LAB — AMMONIA: Ammonia: 44 umol/L — ABNORMAL HIGH (ref 9–35)

## 2022-07-12 LAB — CBC
HCT: 32.9 % — ABNORMAL LOW (ref 39.0–52.0)
Hemoglobin: 10.5 g/dL — ABNORMAL LOW (ref 13.0–17.0)
MCH: 29.1 pg (ref 26.0–34.0)
MCHC: 31.9 g/dL (ref 30.0–36.0)
MCV: 91.1 fL (ref 80.0–100.0)
Platelets: 57 10*3/uL — ABNORMAL LOW (ref 150–400)
RBC: 3.61 MIL/uL — ABNORMAL LOW (ref 4.22–5.81)
RDW: 16.7 % — ABNORMAL HIGH (ref 11.5–15.5)
WBC: 3.3 10*3/uL — ABNORMAL LOW (ref 4.0–10.5)
nRBC: 0 % (ref 0.0–0.2)

## 2022-07-12 LAB — MAGNESIUM: Magnesium: 2.1 mg/dL (ref 1.7–2.4)

## 2022-07-12 NOTE — Progress Notes (Signed)
Gastroenterology Progress Note   Referring Provider: No ref. provider found Primary Care Physician:  [facility] Primary Gastroenterologist:  Dr. Levon Hedger  Patient ID: Daniel Crawford; [id number]; May 22, 1955   Subjective:    Patient feeling better. Feels more like himself. No complaints. Wants to go home.  Objective:   Vital signs in last 24 hours: Temp:  [98.1 F (36.7 C)-98.4 F (36.9 C)] 98.1 F (36.7 C) (04/18 0338) Pulse Rate:  [62-65] 62 (04/18 0338) Resp:  [16-18] 18 (04/18 0338) BP: (110-111)/(54-70) 111/54 (04/18 0338) SpO2:  [96 %-99 %] 99 % (04/18 0338) Last BM Date : 07/11/22 General:   Alert,  Well-developed, well-nourished, pleasant and cooperative in NAD Head:  Normocephalic and atraumatic. Eyes:  Sclera clear, no icterus.   Abdomen:  Soft, nontender and nondistended.  Normal bowel sounds, without guarding, and without rebound.   Extremities:  Trace to 1+ pitting edema below knees. Without clubbing, deformity or edema. Neurologic:  Alert and  oriented x4;  grossly normal neurologically. No asterixis. Skin:  Intact without significant lesions or rashes. Psych:  Alert and cooperative. Normal mood and affect.  Intake/Output from previous day: 04/17 0701 - 04/18 0700 In: 960 [P.O.:960] Out: 3950 [Urine:3950] Intake/Output this shift: No intake/output data recorded.  Lab Results: CBC Recent Labs    07/10/22 2119 07/11/22 0431 07/12/22 0507  WBC 4.3 3.7* 3.3*  HGB 10.9* 10.3* 10.5*  HCT 33.5* 32.2* 32.9*  MCV 90.3 90.7 91.1  PLT 68* 60* 57*   BMET Recent Labs    07/10/22 2119 07/11/22 0431 07/12/22 0507  NA 136 138 136  K 3.9 4.0 3.9  CL 101 103 99  CO2 29 27 30   GLUCOSE 121* 116* 115*  BUN 18 19 18   CREATININE 1.51* 1.39* 1.25*  CALCIUM 8.7* 9.0 8.7*   LFTs Recent Labs    07/10/22 2119 07/11/22 0431  BILITOT 5.1* 4.8*  ALKPHOS 94 83  AST 46* 42*  ALT 20 17  PROT 6.7 6.4*  ALBUMIN 3.2* 3.2*   No results  for input(s): "LIPASE" in the last 72 hours. PT/INR Recent Labs    07/11/22 1207  LABPROT 18.9*  INR 1.6*         Imaging Studies: ECHOCARDIOGRAM COMPLETE  Result Date: 07/07/2022    ECHOCARDIOGRAM REPORT   Patient Name:   Daniel Crawford    Date of Exam: 07/07/2022 Medical Rec #:  [id number]  Height:       74.0 in Accession #:    [id number] Weight:       388.9 lb Date of Birth:  1955/12/24 BSA:          2.883 m Patient Age:    66 years   BP:           156/82 mmHg Patient Gender: M          HR:           86 bpm. [facility] Procedure: 2D Echo, Cardiac Doppler and Color Doppler Indications:    Dyspnea R06.00  History:        Patient has prior history of Echocardiogram examinations, most                 recent 08/31/2020. CHF; Risk Factors:Hypertension and Diabetes.                 NASH (nonalcoholic steatohepatitis) (From Hx).  Sonographer:    Celesta Gentile RCS Referring Phys: [id number] York Ram ARRIEN IMPRESSIONS  1. Left ventricular ejection fraction, by estimation, is 55%. The left ventricle has normal function. The left ventricle has no regional wall motion abnormalities. There is mild left ventricular hypertrophy. Left ventricular diastolic parameters were normal.  2. Right ventricular systolic function is normal. The right ventricular size is mildly enlarged. Tricuspid regurgitation signal is inadequate for assessing PA pressure.  3. Left atrial size was mildly dilated.  4. Right atrial size was mildly dilated.  5. The mitral valve is normal in structure. Trivial mitral valve regurgitation. No evidence of mitral stenosis.  6. The aortic valve is grossly normal. Aortic valve regurgitation is not visualized. No aortic stenosis is present.  7. The inferior vena cava is dilated in size with >50% respiratory variability, suggesting right atrial pressure of 8 mmHg. FINDINGS  Left Ventricle: Left ventricular ejection fraction, by estimation, is 55%. The left ventricle has normal function. The  left ventricle has no regional wall motion abnormalities. The left ventricular internal cavity size was normal in size. There is mild left ventricular hypertrophy. Left ventricular diastolic parameters were normal. Right Ventricle: The right ventricular size is mildly enlarged. No increase in right ventricular wall thickness. Right ventricular systolic function is normal. Tricuspid regurgitation signal is inadequate for assessing PA pressure. Left Atrium: Left atrial size was mildly dilated. Right Atrium: Right atrial size was mildly dilated. Pericardium: There is no evidence of pericardial effusion. Mitral Valve: The mitral valve is normal in structure. Mild mitral annular calcification. Trivial mitral valve regurgitation. No evidence of mitral valve stenosis. Tricuspid Valve: The tricuspid valve is normal in structure. Tricuspid valve regurgitation is trivial. No evidence of tricuspid stenosis. Aortic Valve: The aortic valve is grossly normal. Aortic valve regurgitation is not visualized. No aortic stenosis is present. Pulmonic Valve: The pulmonic valve was normal in structure. Pulmonic valve regurgitation is not visualized. No evidence of pulmonic stenosis. Aorta: The aortic root is normal in size and structure. Ascending aorta measurements are within normal limits for age when indexed to body surface area. Venous: The inferior vena cava is dilated in size with greater than 50% respiratory variability, suggesting right atrial pressure of 8 mmHg. IAS/Shunts: The interatrial septum was not well visualized.  LEFT VENTRICLE PLAX 2D LVIDd:         5.40 cm   Diastology LVIDs:         3.60 cm   LV e' medial:    10.10 cm/s LV PW:         1.20 cm   LV E/e' medial:  9.8 LV IVS:        1.20 cm   LV e' lateral:   14.90 cm/s LVOT diam:     1.90 cm   LV E/e' lateral: 6.7 LV SV:         84 LV SV Index:   29 LVOT Area:     2.84 cm  RIGHT VENTRICLE RV S prime:     14.90 cm/s TAPSE (M-mode): 3.7 cm LEFT ATRIUM              Index         RIGHT ATRIUM           Index LA diam:        4.60 cm  1.60 cm/m   RA Area:     35.20 cm LA Vol (A2C):   133.0 ml 46.13 ml/m  RA Volume:   137.00 ml 47.51 ml/m LA Vol (A4C):   107.0 ml 37.11 ml/m LA Biplane Vol:  120.0 ml 41.62 ml/m  AORTIC VALVE LVOT Vmax:   107.00 cm/s LVOT Vmean:  74.800 cm/s LVOT VTI:    0.296 m  AORTA Ao Root diam: 3.90 cm MITRAL VALVE MV Area (PHT): 4.06 cm    SHUNTS MV Decel Time: 187 msec    Systemic VTI:  0.30 m MV E velocity: 99.10 cm/s  Systemic Diam: 1.90 cm MV A velocity: 52.40 cm/s MV E/A ratio:  1.89 Weston Brass MD Electronically signed by Weston Brass MD Signature Date/Time: 07/07/2022/5:12:38 PM    Final    [place] ASCITES (ABDOMEN LIMITED)  Result Date: 07/05/2022 CLINICAL DATA:  History of cirrhosis EXAM: LIMITED ABDOMEN ULTRASOUND FOR ASCITES TECHNIQUE: Limited ultrasound survey for ascites was performed in all four abdominal quadrants. COMPARISON:  None Available. FINDINGS: All 4 quadrants skin for ascites, trace amount of fluid noted. IMPRESSION: Trace ascites. Electronically Signed   By: Allegra Lai M.D.   On: 07/05/2022 17:15   DG Chest Port 1 View  Result Date: 07/04/2022 CLINICAL DATA:  Shortness of breath with bilateral leg swelling and abdominal swelling. EXAM: PORTABLE CHEST 1 VIEW COMPARISON:  May 21, 2022 FINDINGS: The heart size and mediastinal contours are within normal limits. Both lungs are clear. The visualized skeletal structures are unremarkable. IMPRESSION: No active disease. Electronically Signed   By: Aram Candela M.D.   On: 07/04/2022 17:56  [2 weeks]  Assessment:   Daniel Crawford is a 67 y.o. year old male with history of decompensated Nash cirrhosis w/HE and anasarca, with recent hospital admission for anasarca, discharged on 4/14, who presented to the ED for confusion. Ammonia in ED last night 77, Patient admitted for HE and GI consulted for further evaluation.   Decompensated cirrhosis: MELD 3.0 21.  He is not a  candidate for TIPS or liver transplant due to multimorbidities and BMI. Last EGD in 2021, he is due for repeat EV screening which can be done outpatient once more clinically stable.    AFP has been chronically elevated, last was 9 on 05/17/22.    Fluid overload from last admission has improved signficantly, only minimal ascites, mild pitting edema in LEs. Weight 371lbs yesterday, down from 394 lbs on 4/12. Creatnine is improved today at 1.25, sodium and potassium WNL. He was started on IV furosemide  BID and aldactone  on admission. Should continue with diuretic therapy, close monitoring of electrolytes and renal function. He has not required paracentesis in the past.    Hepatic encephalopathy: reported lethargy with some confusion beginning on 4/15,  does not recall events of yesterday. ammonia 77 on admission, 51 this morning. HE Likely secondary to increase diuretic therapy. Currently receiving lactulose  BID and Xifaxan  BID. Mental status normal.     Hematochezia/colon polyp:  recent colonoscopy with multiple polyps, internal and external hemorrhoids, one polyp in cecum that could not be removed. Toilet tissue hematochezia began last night, likely secondary to known hemorrhoids. Hgb 10.5 today, baseline is 10-11 range. Will need repeat Colonoscopy once he is more euvolemic.     Plan:   At discharge, continue lactulose, titrate to 3-4 soft stools daily. Continue Xifaxin. Recommend home diuretics at discharge. 2g sodium diet.  Will need to monitor creatinine closely after discharge.  We will arrange for hospital follow up in the next 1-2 weeks. If still doing reasonably well with fluid status, consider colonoscopy along with EGD at that time.  GI to sign off. Reach out with questions.   LOS: 1 day   Verlon Au  [facility] [phone] 4/18/20248:07 AM

## 2022-07-12 NOTE — TOC Progression Note (Signed)
Transition of Care [facility]) - Progression Note    Patient Details  Name: Daniel Crawford MRN: [id number] Date of Birth: 03-20-1956  [facility] CM/SW Contact  Elliot Gault, LCSW Phone Number: 07/12/2022, 11:36 AM  Clinical Narrative:     TOC following. Received request from pt for TOC to come speak with him.   Met with pt at bedside. He was requesting assistance with application for Medicaid due to hospital bills. Pt reports that he has previously discussed this with DSS.  Message left for [employer] requesting return call. Will relay pt's question when return call received.   Expected Discharge Plan: Home w Hospice Care Barriers to Discharge: Continued Medical Work up  Expected Discharge Plan and Services In-house Referral: Clinical Social Work   Post Acute Care Choice: Resumption of Svcs/PTA Provider Living arrangements for the past 2 months: Single Family Home                                       Social Determinants of Health (SDOH) Interventions SDOH Screenings   Food Insecurity: No Food Insecurity (07/11/2022)  Housing: Low Risk  (07/11/2022)  Transportation Needs: No Transportation Needs (07/11/2022)  Utilities: Not At Risk (07/11/2022)  Depression (PHQ2-9): Medium Risk (10/27/2019)  Tobacco Use: Medium Risk (07/10/2022)    Readmission Risk Interventions    07/11/2022   12:24 PM 07/05/2022   12:16 PM 05/22/2022   10:18 AM  Readmission Risk Prevention Plan  Transportation Screening Complete Complete Complete  Medication Review Oceanographer) Complete Complete Complete  HRI or Home Care Consult Complete Complete Complete  SW Recovery Care/Counseling Consult Complete Complete Complete  Palliative Care Screening  Not Applicable Not Applicable  Skilled Nursing Facility Not Applicable Not Applicable Not Applicable

## 2022-07-12 NOTE — Progress Notes (Signed)
PROGRESS NOTE  Daniel Crawford ZOX:[id number] DOB: 1955/11/08 DOA: 07/10/2022 PCP: Elmer Picker [facility]  Brief History:  67 year old male with a history of diabetes mellitus type 2, hypertension, hypothyroidism, NASH liver cirrhosis presenting with altered mental status. The patient was recently mated to the hospital from 07/04/2022 to 07/08/2022 for decompensated liver cirrhosis and anasarca.  He was discharged home with torsemide 40 mg twice daily and spironolactone 100 mg daily.  His discharge weight was 173.2 kg. He had some generalized weakness at the time of discharge.  He states that he felt somewhat lethargic on 07/08/2021.  He remembers sitting in his recliner but did not remember much of anything else on that day.  Apparently, his friend tried to reach him by telephone but there was no answer.  He stated that the police subsequently came to check up on him, and he was noted to be confused.  He states that he did not take any of his diuretics or other medications on 07/09/2022 or 07/10/2022. He denies any fevers, chills, chest pain, cough, hemoptysis, nausea, vomiting, diarrhea.  In the ED, the patient was afebrile hemodynamically stable with oxygen saturation 97% on room air.  WBC 4.3, hemoglobin 10.9, platelets 68.  Sodium 138, potassium 4.0, bicarbonate 27, serum creatinine 1.39.  AST 42, ALT 17, alk phosphatase 83, total bilirubin 4.8.  Ammonia 77.  TSH 7.822.  UA was negative for pyuria.  The patient was given a dose of lactulose and restarted on rifaximin.   Assessment/Plan: Hepatic encephalopathy -07/11/2022--patient more alert but still slow with responses -Restarted>>44 lactulose -Continue rifaximin -Ammonia 77>> 51 -Check B12 -TSH 7.822   Anasarca/liver cirrhosis with ascites -The patient remains fluid overloaded -Restart IV furosemide -Patient has had a propensity to take his diuretics only once daily at home -Continue spironolactone -Daily  weights -Patient has had a degree of dietary/fluid indiscretion contributing to his frequent decompensations -Low-sodium diet -Patient is not felt to be a TIPS or transplant candidate secondary to comorbidities -continue rifaximin -previously evaluated by [facility] for liver transplant, though not a candidate due to his BMI, multi-morbidities.    Hematochezia -due to ano-rectal source -06/28/21 colonoscopy--small int and ext hemorrhoids, transverse colon polyps, sessile cecal polyp -GI consult appreciated>>no further eval for now   Pancytopenia -due to liver cirrhosis -monitor for signs of bleeding   Diabetes mellitus type 2 -not on any agents at home -check A1C--5.1   Essential hypertension Holding coreg temporarily to allow BP margin for diuresis>> -restarted with stable BP   Hypothyroidism -TSH 7.822 -Increase Synthroid to 200 mcg daily   Depression/anxiety Continue Zoloft/hydroxyzine/trazodone   CKD 3a -baseline creatinine 1.1-1.4 -monitor with diuresis           Family Communication:  no Family at bedside   Consultants:  GI   Code Status:  FULL   DVT Prophylaxis:  SCDs     Procedures: As Listed in Progress Note Above   Antibiotics: None          Subjective: Patient denies fevers, chills, headache, chest pain, dyspnea, nausea, vomiting, diarrhea, abdominal pain, dysuria, hematuria, hematochezia, and melena.   Objective: Vitals:   07/11/22 1142 07/11/22 1349 07/12/22 0338 07/12/22 1329  BP: 111/67 110/70 (!) 111/54 110/64  Pulse: 65 64 62 61  Resp:  16 18   Temp:  98.4 F (36.9 C) 98.1 F (36.7 C) 98.2 F (36.8 C)  TempSrc:   Oral Oral  SpO2: 96%  99% 98%  Weight:      Height:        Intake/Output Summary (Last 24 hours) at 07/12/2022 1735 Last data filed at 07/12/2022 1711 Gross per 24 hour  Intake 240 ml  Output 2900 ml  Net -2660 ml   Weight change:  Exam:  General:  Pt is alert, follows commands appropriately, not in acute  distress HEENT: No icterus, No thrush, No neck mass, [place]/AT Cardiovascular: RRR, S1/S2, no rubs, no gallops Respiratory: fine bibasilar crackles. No wheeze Abdomen: Soft/+BS, non tender, non distended, no guarding Extremities: trace LE edema, No lymphangitis, No petechiae, No rashes, no synovitis   Data Reviewed: I have personally reviewed following labs and imaging studies Basic Metabolic Panel: Recent Labs  Lab 07/07/22 0512 07/08/22 0455 07/10/22 2119 07/11/22 0431 07/12/22 0507  NA 135 137 136 138 136  K 3.8 3.9 3.9 4.0 3.9  CL 97* 95* 101 103 99  CO2 32 33* GLUCOSE 115* 114* 121* 116* 115*  BUN CREATININE 1.41* 1.47* 1.51* 1.39* 1.25*  CALCIUM 8.6* 9.0 8.7* 9.0 8.7*  MG  --   --   --  2.2 2.1   Liver Function Tests: Recent Labs  Lab 07/06/22 0507 07/10/22 2119 07/11/22 0431  AST 37 46* 42*  ALT ALKPHOS 89 94 83  BILITOT 3.7* 5.1* 4.8*  PROT 6.5 6.7 6.4*  ALBUMIN 3.2* 3.2* 3.2*   No results for input(s): "LIPASE", "AMYLASE" in the last 168 hours. Recent Labs  Lab 07/10/22 2119 07/11/22 0431 07/12/22 0507  AMMONIA 77* 51* 44*   Coagulation Profile: Recent Labs  Lab 07/06/22 0507 07/11/22 1207  INR 1.6* 1.6*   CBC: Recent Labs  Lab 07/06/22 0507 07/10/22 2119 07/11/22 0431 07/12/22 0507  WBC 3.1* 4.3 3.7* 3.3*  NEUTROABS 1.7  --  2.1  --   HGB 10.6* 10.9* 10.3* 10.5*  HCT 33.4* 33.5* 32.2* 32.9*  MCV 90.8 90.3 90.7 91.1  PLT 69* 68* 60* 57*   Cardiac Enzymes: No results for input(s): "CKTOTAL", "CKMB", "CKMBINDEX", "TROPONINI" in the last 168 hours. BNP: Invalid input(s): "POCBNP" CBG: Recent Labs  Lab 07/08/22 0011 07/08/22 0610 07/08/22 1110 07/10/22 2107  GLUCAP 129* 198* 133* 125*   HbA1C: No results for input(s): "HGBA1C" in the last 72 hours. Urine analysis:    Component Value Date/Time   COLORURINE AMBER (A) 07/10/2022 2103   APPEARANCEUR CLEAR 07/10/2022 2103   LABSPEC 1.021  07/10/2022 2103   PHURINE 7.0 07/10/2022 2103   GLUCOSEU NEGATIVE 07/10/2022 2103   HGBUR SMALL (A) 07/10/2022 2103   BILIRUBINUR SMALL (A) 07/10/2022 2103   KETONESUR NEGATIVE 07/10/2022 2103   PROTEINUR 30 (A) 07/10/2022 2103   NITRITE NEGATIVE 07/10/2022 2103   LEUKOCYTESUR NEGATIVE 07/10/2022 2103   Sepsis Labs: (procalcitonin:4,lacticidven:4) )No results found for this or any previous visit (from the past 240 hour(s)).   Scheduled Meds:  carvedilol  6.25 mg Oral BID WC   furosemide  40 mg Intravenous BID   lactulose  20 g Oral BID   levothyroxine  200 mcg Oral Q0600   pantoprazole  40 mg Oral Daily   rifaximin  550 mg Oral BID   sertraline  50 mg Oral QHS   spironolactone  100 mg Oral Daily   traZODone  50 mg Oral QHS   Continuous Infusions:  Procedures/Studies: ECHOCARDIOGRAM COMPLETE  Result Date: 07/07/2022    ECHOCARDIOGRAM REPORT   Patient Name:   Trevaun  Stanzione    Date of Exam: 07/07/2022 Medical Rec #:  [id number]  Height:       74.0 in Accession #:    [id number] Weight:       388.9 lb Date of Birth:  26-Sep-1955 BSA:          2.883 m Patient Age:    66 years   BP:           156/82 mmHg Patient Gender: M          HR:           86 bpm. [facility] Procedure: 2D Echo, Cardiac Doppler and Color Doppler Indications:    Dyspnea R06.00  History:        Patient has prior history of Echocardiogram examinations, most                 recent 08/31/2020. CHF; Risk Factors:Hypertension and Diabetes.                 NASH (nonalcoholic steatohepatitis) (From Hx).  Sonographer:    Celesta Gentile RCS Referring Phys: [id number] MAURICIO DANIEL ARRIEN IMPRESSIONS  1. Left ventricular ejection fraction, by estimation, is 55%. The left ventricle has normal function. The left ventricle has no regional wall motion abnormalities. There is mild left ventricular hypertrophy. Left ventricular diastolic parameters were normal.  2. Right ventricular systolic function is normal. The right  ventricular size is mildly enlarged. Tricuspid regurgitation signal is inadequate for assessing PA pressure.  3. Left atrial size was mildly dilated.  4. Right atrial size was mildly dilated.  5. The mitral valve is normal in structure. Trivial mitral valve regurgitation. No evidence of mitral stenosis.  6. The aortic valve is grossly normal. Aortic valve regurgitation is not visualized. No aortic stenosis is present.  7. The inferior vena cava is dilated in size with >50% respiratory variability, suggesting right atrial pressure of 8 mmHg. FINDINGS  Left Ventricle: Left ventricular ejection fraction, by estimation, is 55%. The left ventricle has normal function. The left ventricle has no regional wall motion abnormalities. The left ventricular internal cavity size was normal in size. There is mild left ventricular hypertrophy. Left ventricular diastolic parameters were normal. Right Ventricle: The right ventricular size is mildly enlarged. No increase in right ventricular wall thickness. Right ventricular systolic function is normal. Tricuspid regurgitation signal is inadequate for assessing PA pressure. Left Atrium: Left atrial size was mildly dilated. Right Atrium: Right atrial size was mildly dilated. Pericardium: There is no evidence of pericardial effusion. Mitral Valve: The mitral valve is normal in structure. Mild mitral annular calcification. Trivial mitral valve regurgitation. No evidence of mitral valve stenosis. Tricuspid Valve: The tricuspid valve is normal in structure. Tricuspid valve regurgitation is trivial. No evidence of tricuspid stenosis. Aortic Valve: The aortic valve is grossly normal. Aortic valve regurgitation is not visualized. No aortic stenosis is present. Pulmonic Valve: The pulmonic valve was normal in structure. Pulmonic valve regurgitation is not visualized. No evidence of pulmonic stenosis. Aorta: The aortic root is normal in size and structure. Ascending aorta measurements are  within normal limits for age when indexed to body surface area. Venous: The inferior vena cava is dilated in size with greater than 50% respiratory variability, suggesting right atrial pressure of 8 mmHg. IAS/Shunts: The interatrial septum was not well visualized.  LEFT VENTRICLE PLAX 2D LVIDd:         5.40 cm   Diastology LVIDs:  3.60 cm   LV e' medial:    10.10 cm/s LV PW:         1.20 cm   LV E/e' medial:  9.8 LV IVS:        1.20 cm   LV e' lateral:   14.90 cm/s LVOT diam:     1.90 cm   LV E/e' lateral: 6.7 LV SV:         84 LV SV Index:   29 LVOT Area:     2.84 cm  RIGHT VENTRICLE RV S prime:     14.90 cm/s TAPSE (M-mode): 3.7 cm LEFT ATRIUM              Index        RIGHT ATRIUM           Index LA diam:        4.60 cm  1.60 cm/m   RA Area:     35.20 cm LA Vol (A2C):   133.0 ml 46.13 ml/m  RA Volume:   137.00 ml 47.51 ml/m LA Vol (A4C):   107.0 ml 37.11 ml/m LA Biplane Vol: 120.0 ml 41.62 ml/m  AORTIC VALVE LVOT Vmax:   107.00 cm/s LVOT Vmean:  74.800 cm/s LVOT VTI:    0.296 m  AORTA Ao Root diam: 3.90 cm MITRAL VALVE MV Area (PHT): 4.06 cm    SHUNTS MV Decel Time: 187 msec    Systemic VTI:  0.30 m MV E velocity: 99.10 cm/s  Systemic Diam: 1.90 cm MV A velocity: 52.40 cm/s MV E/A ratio:  1.89 Weston Brass MD Electronically signed by Weston Brass MD Signature Date/Time: 07/07/2022/5:12:38 PM    Final    [place] ASCITES (ABDOMEN LIMITED)  Result Date: 07/05/2022 CLINICAL DATA:  History of cirrhosis EXAM: LIMITED ABDOMEN ULTRASOUND FOR ASCITES TECHNIQUE: Limited ultrasound survey for ascites was performed in all four abdominal quadrants. COMPARISON:  None Available. FINDINGS: All 4 quadrants skin for ascites, trace amount of fluid noted. IMPRESSION: Trace ascites. Electronically Signed   By: Allegra Lai M.D.   On: 07/05/2022 17:15   DG Chest Port 1 View  Result Date: 07/04/2022 CLINICAL DATA:  Shortness of breath with bilateral leg swelling and abdominal swelling. EXAM: PORTABLE CHEST 1  VIEW COMPARISON:  May 21, 2022 FINDINGS: The heart size and mediastinal contours are within normal limits. Both lungs are clear. The visualized skeletal structures are unremarkable. IMPRESSION: No active disease. Electronically Signed   By: Aram Candela M.D.   On: 07/04/2022 17:56    Catarina Hartshorn, DO  [facility]  If 7PM-7AM, please contact night-coverage [email or web address] Password [email or web address] 07/12/2022, 5:35 PM   LOS: 1 day

## 2022-07-12 NOTE — Telephone Encounter (Signed)
Patient missed appt yesterday as he was in the hospital with hepatic encephalopathy. Per Dr. Levon Hedger, he needs ov in 1-2 weeks for hospital follow up and to schedule colonoscopy.

## 2022-07-12 NOTE — Progress Notes (Signed)
Patient lying in bed with eyes closed resting. Patient denies any concerns or complaints since the complaint of pain that patient complained of at the beginning of the shift, which pt. Refused medication for at that time. Denies any further pain now. All safety measures remain in place for patient safety. Call bell and belongings within pt.'s reach. Will continue to monitor and report off to oncoming shift.

## 2022-07-13 DIAGNOSIS — D696 Thrombocytopenia, unspecified: Secondary | ICD-10-CM | POA: Diagnosis not present

## 2022-07-13 DIAGNOSIS — K7581 Nonalcoholic steatohepatitis (NASH): Secondary | ICD-10-CM | POA: Diagnosis not present

## 2022-07-13 DIAGNOSIS — K7460 Unspecified cirrhosis of liver: Secondary | ICD-10-CM | POA: Diagnosis not present

## 2022-07-13 DIAGNOSIS — K7682 Hepatic encephalopathy: Secondary | ICD-10-CM | POA: Diagnosis not present

## 2022-07-13 LAB — BASIC METABOLIC PANEL
Anion gap: 8 (ref 5–15)
BUN: 19 mg/dL (ref 8–23)
CO2: 28 mmol/L (ref 22–32)
Calcium: 8.7 mg/dL — ABNORMAL LOW (ref 8.9–10.3)
Chloride: 99 mmol/L (ref 98–111)
Creatinine, Ser: 1.31 mg/dL — ABNORMAL HIGH (ref 0.61–1.24)
GFR, Estimated: >60 mL/min (ref >60)
Glucose, Bld: 110 mg/dL — ABNORMAL HIGH (ref 70–99)
Potassium: 3.7 mmol/L (ref 3.5–5.1)
Sodium: 135 mmol/L (ref 135–145)

## 2022-07-13 LAB — AMMONIA: Ammonia: 74 umol/L — ABNORMAL HIGH (ref 9–35)

## 2022-07-13 MED ORDER — LACTULOSE 10 GM/15ML PO SOLN: 20.0000 g | Freq: Two times a day (BID) | ORAL | 1 refills | Status: DC

## 2022-07-13 NOTE — Discharge Summary (Signed)
Physician Discharge Summary   Patient: Daniel Crawford MRN: [id number] DOB: [birth year]  Admit date:     07/10/2022  Discharge date: 07/13/22  Discharge Physician: Onalee Hua Estuardo Frisbee   PCP: [facility]    Please follow up with primary care provider within 1-2 weeks  Please repeat BMP and CBC in one week  Please follow up on/with     Hospital Course: 67 year old male with a history of diabetes mellitus type 2, hypertension, hypothyroidism, NASH liver cirrhosis presenting with altered mental status. The patient was recently mated to the hospital from 07/04/2022 to 07/08/2022 for decompensated liver cirrhosis and anasarca.  He was discharged home with torsemide 40 mg twice daily and spironolactone 100 mg daily.  His discharge weight was 173.2 kg. He had some generalized weakness at the time of discharge.  He states that he felt somewhat lethargic on 07/08/2021.  He remembers sitting in his recliner but did not remember much of anything else on that day.  Apparently, his friend tried to reach him by telephone but there was no answer.  He stated that the police subsequently came to check up on him, and he was noted to be confused.  He states that he did not take any of his diuretics or other medications on 07/09/2022 or 07/10/2022. He denies any fevers, chills, chest pain, cough, hemoptysis, nausea, vomiting, diarrhea.  In the ED, the patient was afebrile hemodynamically stable with oxygen saturation 97% on room air.  WBC 4.3, hemoglobin 10.9, platelets 68.  Sodium 138, potassium 4.0, bicarbonate 27, serum creatinine 1.39.  AST 42, ALT 17, alk phosphatase 83, total bilirubin 4.8.  Ammonia 77.  TSH 7.822.  UA was negative for pyuria.  The patient was given a dose of lactulose and restarted on rifaximin.  Assessment and Plan:  Hepatic encephalopathy -07/11/2022--patient more alert but still slow with responses -Restarted>>44 lactulose -Continue rifaximin -Ammonia 77>> 51>>44 -Check B12 -TSH  7.822 -ammonia up to 74 on day of d/c but patient's mental status overall improved and at baseline>>continue rifaximin at home, added lactulose   Anasarca/liver cirrhosis with ascites -The patient remains fluid overloaded -Restart IV furosemide>>transition back to torsemid 40 mg bid  -Patient has had a propensity to take his diuretics only once daily at home -Continue spironolactone -Daily weights--362 on day of d/c -Patient has had a degree of dietary/fluid indiscretion contributing to his frequent decompensations -Low-sodium diet -Patient is not felt to be a TIPS or transplant candidate secondary to comorbidities -continue rifaximin -previously evaluated by [facility] for liver transplant, though not a candidate due to his BMI, multi-morbidities.    Hematochezia -due to ano-rectal source -06/28/21 colonoscopy--small int and ext hemorrhoids, transverse colon polyps, sessile cecal polyp -GI consult appreciated>>no further eval for now -hgb remains stable in 10 range   Pancytopenia -due to liver cirrhosis -monitor for signs of bleeding   Diabetes mellitus type 2 -not on any agents at home -check A1C--5.1   Essential hypertension Holding coreg temporarily to allow BP margin for diuresis>> -restarted with stable BP   Hypothyroidism -TSH 7.822 -Increase Synthroid to 200 mcg daily   Depression/anxiety Continue Zoloft/hydroxyzine/trazodone   CKD 3a -baseline creatinine 1.1-1.4 -monitor with diuresis           Consultants: GI Procedures performed: none  Disposition: Home Diet recommendation:  Cardiac diet DISCHARGE MEDICATION: Allergies as of 07/13/2022   No Known Allergies      Medication List     TAKE these medications    acetaminophen 500 MG tablet  Commonly known as: TYLENOL Take 1,000 mg by mouth every 6 (six) hours as needed for moderate pain.   albuterol 108 (90 Base) MCG/ACT inhaler Commonly known as: VENTOLIN HFA Inhale 2 puffs into the lungs every 6  (six) hours as needed for wheezing or shortness of breath.   carvedilol 6.25 MG tablet Commonly known as: COREG Take 1 tablet (6.25 mg total) by mouth 2 (two) times daily with a meal.   gabapentin 100 MG capsule Commonly known as: NEURONTIN Take 100 mg by mouth daily as needed (pain).   hydrOXYzine 50 MG capsule Commonly known as: VISTARIL Take 1 capsule (50 mg total) by mouth 3 (three) times daily as needed for anxiety or nausea.   lactose free nutrition Liqd Take 237 mLs by mouth every evening.   lactulose 10 GM/15ML solution Commonly known as: CHRONULAC Take 30 mLs (20 g total) by mouth 2 (two) times daily.   levothyroxine 175 MCG tablet Commonly known as: SYNTHROID Take 175 mcg by mouth daily before breakfast.   ondansetron 8 MG tablet Commonly known as: ZOFRAN Take by mouth every 8 (eight) hours as needed for nausea or vomiting.   pantoprazole 40 MG tablet Commonly known as: PROTONIX Take 1 tablet (40 mg total) by mouth daily.   potassium chloride SA 20 MEQ tablet Commonly known as: KLOR-CON M Take 2 tablets (40 mEq total) by mouth daily. What changed: when to take this   rifaximin 550 MG Tabs tablet Commonly known as: XIFAXAN Take 1 tablet (550 mg total) by mouth 2 (two) times daily.   sertraline 50 MG tablet Commonly known as: ZOLOFT Take 1 tablet (50 mg total) by mouth at bedtime.   spironolactone 100 MG tablet Commonly known as: ALDACTONE Take 1 tablet (100 mg total) by mouth daily.   Torsemide 40 MG Tabs Take 40 mg by mouth 2 (two) times daily. What changed: when to take this   traZODone 50 MG tablet Commonly known as: DESYREL Take 1 tablet (50 mg total) by mouth at bedtime.        Discharge Exam: Filed Weights   07/10/22 2057 07/11/22 0004  Weight: (!) 175 kg (!) 168.4 kg   HEENT:  [place]/AT, No thrush, no icterus CV:  RRR, no rub, no S3, no S4 Lung:  CTA, no wheeze, no rhonchi Abd:  soft/+BS, NT Ext:  trace LE edema, no lymphangitis, no  synovitis, no rash   Condition at discharge: stable  The results of significant diagnostics from this hospitalization (including imaging, microbiology, ancillary and laboratory) are listed below for reference.   Imaging Studies: ECHOCARDIOGRAM COMPLETE  Result Date: 07/07/2022    ECHOCARDIOGRAM REPORT   Patient Name:   Tavi Repka    Date of Exam: 07/07/2022 Medical Rec #:  [id number]  Height:       74.0 in Accession #:    [id number] Weight:       388.9 lb Date of Birth:  1955/11/07 BSA:          2.883 m Patient Age:    66 years   BP:           156/82 mmHg Patient Gender: M          HR:           86 bpm. [facility] Procedure: 2D Echo, Cardiac Doppler and Color Doppler Indications:    Dyspnea R06.00  History:        Patient has prior history of Echocardiogram examinations, most  recent 08/31/2020. CHF; Risk Factors:Hypertension and Diabetes.                 NASH (nonalcoholic steatohepatitis) (From Hx).  Sonographer:    Celesta Gentile RCS Referring Phys: [id number] MAURICIO DANIEL ARRIEN IMPRESSIONS  1. Left ventricular ejection fraction, by estimation, is 55%. The left ventricle has normal function. The left ventricle has no regional wall motion abnormalities. There is mild left ventricular hypertrophy. Left ventricular diastolic parameters were normal.  2. Right ventricular systolic function is normal. The right ventricular size is mildly enlarged. Tricuspid regurgitation signal is inadequate for assessing PA pressure.  3. Left atrial size was mildly dilated.  4. Right atrial size was mildly dilated.  5. The mitral valve is normal in structure. Trivial mitral valve regurgitation. No evidence of mitral stenosis.  6. The aortic valve is grossly normal. Aortic valve regurgitation is not visualized. No aortic stenosis is present.  7. The inferior vena cava is dilated in size with >50% respiratory variability, suggesting right atrial pressure of 8 mmHg. FINDINGS  Left Ventricle: Left  ventricular ejection fraction, by estimation, is 55%. The left ventricle has normal function. The left ventricle has no regional wall motion abnormalities. The left ventricular internal cavity size was normal in size. There is mild left ventricular hypertrophy. Left ventricular diastolic parameters were normal. Right Ventricle: The right ventricular size is mildly enlarged. No increase in right ventricular wall thickness. Right ventricular systolic function is normal. Tricuspid regurgitation signal is inadequate for assessing PA pressure. Left Atrium: Left atrial size was mildly dilated. Right Atrium: Right atrial size was mildly dilated. Pericardium: There is no evidence of pericardial effusion. Mitral Valve: The mitral valve is normal in structure. Mild mitral annular calcification. Trivial mitral valve regurgitation. No evidence of mitral valve stenosis. Tricuspid Valve: The tricuspid valve is normal in structure. Tricuspid valve regurgitation is trivial. No evidence of tricuspid stenosis. Aortic Valve: The aortic valve is grossly normal. Aortic valve regurgitation is not visualized. No aortic stenosis is present. Pulmonic Valve: The pulmonic valve was normal in structure. Pulmonic valve regurgitation is not visualized. No evidence of pulmonic stenosis. Aorta: The aortic root is normal in size and structure. Ascending aorta measurements are within normal limits for age when indexed to body surface area. Venous: The inferior vena cava is dilated in size with greater than 50% respiratory variability, suggesting right atrial pressure of 8 mmHg. IAS/Shunts: The interatrial septum was not well visualized.  LEFT VENTRICLE PLAX 2D LVIDd:         5.40 cm   Diastology LVIDs:         3.60 cm   LV e' medial:    10.10 cm/s LV PW:         1.20 cm   LV E/e' medial:  9.8 LV IVS:        1.20 cm   LV e' lateral:   14.90 cm/s LVOT diam:     1.90 cm   LV E/e' lateral: 6.7 LV SV:         84 LV SV Index:   29 LVOT Area:     2.84 cm   RIGHT VENTRICLE RV S prime:     14.90 cm/s TAPSE (M-mode): 3.7 cm LEFT ATRIUM              Index        RIGHT ATRIUM           Index LA diam:        4.60  cm  1.60 cm/m   RA Area:     35.20 cm LA Vol (A2C):   133.0 ml 46.13 ml/m  RA Volume:   137.00 ml 47.51 ml/m LA Vol (A4C):   107.0 ml 37.11 ml/m LA Biplane Vol: 120.0 ml 41.62 ml/m  AORTIC VALVE LVOT Vmax:   107.00 cm/s LVOT Vmean:  74.800 cm/s LVOT VTI:    0.296 m  AORTA Ao Root diam: 3.90 cm MITRAL VALVE MV Area (PHT): 4.06 cm    SHUNTS MV Decel Time: 187 msec    Systemic VTI:  0.30 m MV E velocity: 99.10 cm/s  Systemic Diam: 1.90 cm MV A velocity: 52.40 cm/s MV E/A ratio:  1.89 Weston Brass MD Electronically signed by Weston Brass MD Signature Date/Time: 07/07/2022/5:12:38 PM    Final    [place] ASCITES (ABDOMEN LIMITED)  Result Date: 07/05/2022 CLINICAL DATA:  History of cirrhosis EXAM: LIMITED ABDOMEN ULTRASOUND FOR ASCITES TECHNIQUE: Limited ultrasound survey for ascites was performed in all four abdominal quadrants. COMPARISON:  None Available. FINDINGS: All 4 quadrants skin for ascites, trace amount of fluid noted. IMPRESSION: Trace ascites. Electronically Signed   By: Allegra Lai M.D.   On: 07/05/2022 17:15   DG Chest Port 1 View  Result Date: 07/04/2022 CLINICAL DATA:  Shortness of breath with bilateral leg swelling and abdominal swelling. EXAM: PORTABLE CHEST 1 VIEW COMPARISON:  May 21, 2022 FINDINGS: The heart size and mediastinal contours are within normal limits. Both lungs are clear. The visualized skeletal structures are unremarkable. IMPRESSION: No active disease. Electronically Signed   By: Aram Candela M.D.   On: 07/04/2022 17:56    Microbiology: Results for orders placed or performed during the hospital encounter of 02/27/22  Resp Panel by RT-PCR (Flu A&B, Covid) Anterior Nasal Swab     Status: None   Collection Time: 02/27/22  6:48 PM   Specimen: Anterior Nasal Swab  Result Value Ref Range Status   SARS  Coronavirus 2 by RT PCR NEGATIVE NEGATIVE Final    Comment: (NOTE) SARS-CoV-2 target nucleic acids are NOT DETECTED.  The SARS-CoV-2 RNA is generally detectable in upper respiratory specimens during the acute phase of infection. The lowest concentration of SARS-CoV-2 viral copies this assay can detect is 138 copies/mL. A negative result does not preclude SARS-Cov-2 infection and should not be used as the sole basis for treatment or other patient management decisions. A negative result Lehane occur with  improper specimen collection/handling, submission of specimen other than nasopharyngeal swab, presence of viral mutation(s) within the areas targeted by this assay, and inadequate number of viral copies(<138 copies/mL). A negative result must be combined with clinical observations, patient history, and epidemiological information. The expected result is Negative.  Fact Sheet for Patients:  BloggerCourse.com  Fact Sheet for Healthcare Providers:  SeriousBroker.it  This test is no t yet approved or cleared by the [place] FDA and  has been authorized for detection and/or diagnosis of SARS-CoV-2 by FDA under an Emergency Use Authorization (EUA). This EUA will remain  in effect (meaning this test can be used) for the duration of the COVID-19 declaration under Section 564(b)(1) of the Act, 21 U.S.C.section 360bbb-3(b)(1), unless the authorization is terminated  or revoked sooner.       Influenza A by PCR NEGATIVE NEGATIVE Final   Influenza B by PCR NEGATIVE NEGATIVE Final    Comment: (NOTE) The Xpert Xpress SARS-CoV-2/FLU/RSV plus assay is intended as an aid in the diagnosis of influenza from Nasopharyngeal swab specimens and  should not be used as a sole basis for treatment. Nasal washings and aspirates are unacceptable for Xpert Xpress SARS-CoV-2/FLU/RSV testing.  Fact Sheet for  Patients: BloggerCourse.com  Fact Sheet for Healthcare Providers: SeriousBroker.it  This test is not yet approved or cleared by the [place] FDA and has been authorized for detection and/or diagnosis of SARS-CoV-2 by FDA under an Emergency Use Authorization (EUA). This EUA will remain in effect (meaning this test can be used) for the duration of the COVID-19 declaration under Section 564(b)(1) of the Act, 21 U.S.C. section 360bbb-3(b)(1), unless the authorization is terminated or revoked.  Performed at [facility], [address], [place], [place] [address]     Labs: CBC: Recent Labs  Lab 07/10/22 2119 07/11/22 0431 07/12/22 0507  WBC 4.3 3.7* 3.3*  NEUTROABS  --  2.1  --   HGB 10.9* 10.3* 10.5*  HCT 33.5* 32.2* 32.9*  MCV 90.3 90.7 91.1  PLT 68* 60* 57*   Basic Metabolic Panel: Recent Labs  Lab 07/08/22 0455 07/10/22 2119 07/11/22 0431 07/12/22 0507 07/13/22 0413  NA 137 136 138 136 135  K 3.9 3.9 4.0 3.9 3.7  CL 95* 101 103 99 99  CO2 33* GLUCOSE 114* 121* 116* 115* 110*  BUN CREATININE 1.47* 1.51* 1.39* 1.25* 1.31*  CALCIUM 9.0 8.7* 9.0 8.7* 8.7*  MG  --   --  2.2 2.1  --    Liver Function Tests: Recent Labs  Lab 07/10/22 2119 07/11/22 0431  AST 46* 42*  ALT 20 17  ALKPHOS 94 83  BILITOT 5.1* 4.8*  PROT 6.7 6.4*  ALBUMIN 3.2* 3.2*   CBG: Recent Labs  Lab 07/08/22 0011 07/08/22 0610 07/08/22 1110 07/10/22 2107  GLUCAP 129* 198* 133* 125*    Discharge time spent: greater than 30 minutes.  Signed: Catarina Hartshorn, MD [facility] 07/13/2022

## 2022-07-13 NOTE — Care Management Important Message (Signed)
Important Message  Patient Details  Name: Daniel Crawford MRN: [id number] Date of Birth: 1955/09/13   Medicare Important Message Given:  Yes     [name] 07/13/2022, 11:05 AM

## 2022-07-13 NOTE — Progress Notes (Signed)
Nsg Discharge Note  Admit Date:  07/10/2022 Discharge date: 07/13/2022   Daniel Crawford to be D/C'd Home per MD order.  AVS completed.  Copy for chart, and copy for patient signed, and dated. Patient/caregiver able to verbalize understanding.  Discharge Medication: Allergies as of 07/13/2022   No Known Allergies      Medication List     TAKE these medications    acetaminophen 500 MG tablet Commonly known as: TYLENOL Take 1,000 mg by mouth every 6 (six) hours as needed for moderate pain.   albuterol 108 (90 Base) MCG/ACT inhaler Commonly known as: VENTOLIN HFA Inhale 2 puffs into the lungs every 6 (six) hours as needed for wheezing or shortness of breath.   carvedilol 6.25 MG tablet Commonly known as: COREG Take 1 tablet (6.25 mg total) by mouth 2 (two) times daily with a meal.   gabapentin 100 MG capsule Commonly known as: NEURONTIN Take 100 mg by mouth daily as needed (pain).   hydrOXYzine 50 MG capsule Commonly known as: VISTARIL Take 1 capsule (50 mg total) by mouth 3 (three) times daily as needed for anxiety or nausea.   lactose free nutrition Liqd Take 237 mLs by mouth every evening.   lactulose 10 GM/15ML solution Commonly known as: CHRONULAC Take 30 mLs (20 g total) by mouth 2 (two) times daily.   levothyroxine 175 MCG tablet Commonly known as: SYNTHROID Take 175 mcg by mouth daily before breakfast.   ondansetron 8 MG tablet Commonly known as: ZOFRAN Take by mouth every 8 (eight) hours as needed for nausea or vomiting.   pantoprazole 40 MG tablet Commonly known as: PROTONIX Take 1 tablet (40 mg total) by mouth daily.   potassium chloride SA 20 MEQ tablet Commonly known as: KLOR-CON M Take 2 tablets (40 mEq total) by mouth daily. What changed: when to take this   rifaximin 550 MG Tabs tablet Commonly known as: XIFAXAN Take 1 tablet (550 mg total) by mouth 2 (two) times daily.   sertraline 50 MG tablet Commonly known as: ZOLOFT Take 1 tablet (50 mg  total) by mouth at bedtime.   spironolactone 100 MG tablet Commonly known as: ALDACTONE Take 1 tablet (100 mg total) by mouth daily.   Torsemide 40 MG Tabs Take 40 mg by mouth 2 (two) times daily. What changed: when to take this   traZODone 50 MG tablet Commonly known as: DESYREL Take 1 tablet (50 mg total) by mouth at bedtime.        Discharge Assessment: Vitals:   07/13/22 0440 07/13/22 0900  BP: 132/77 131/69  Pulse: (!) 59 64  Resp: 18   Temp: 97.7 F (36.5 C)   SpO2: 97%    Skin clean, dry and intact without evidence of skin break down, no evidence of skin tears noted. IV catheter discontinued intact. Site without signs and symptoms of complications - no redness or edema noted at insertion site, patient denies c/o pain - only slight tenderness at site.  Dressing with slight pressure applied.  D/c Instructions-Education: Discharge instructions given to patient/family with verbalized understanding. D/c education completed with patient/family including follow up instructions, medication list, d/c activities limitations if indicated, with other d/c instructions as indicated by MD - patient able to verbalize understanding, all questions fully answered. Patient instructed to return to ED, call 911, or call MD for any changes in condition.  Patient escorted via WC, and D/C home via private auto.  Demetrio Lapping, LPN 1/61/0960 45:40 AM

## 2022-07-25 ENCOUNTER — Encounter (HOSPITAL_COMMUNITY): Payer: Self-pay | Admitting: Cardiology

## 2022-07-25 ENCOUNTER — Ambulatory Visit (HOSPITAL_COMMUNITY)
Admission: RE | Admit: 2022-07-25 | Discharge: 2022-07-25 | Disposition: A | Discharge status: Home / Self Care | Source: Ambulatory Visit | Attending: Cardiology | Admitting: Cardiology

## 2022-07-25 VITALS — BP 90/50 | HR 55 | Wt 372.2 lb

## 2022-07-25 DIAGNOSIS — E039 Hypothyroidism, unspecified: Secondary | ICD-10-CM | POA: Insufficient documentation

## 2022-07-25 DIAGNOSIS — Z87891 Personal history of nicotine dependence: Secondary | ICD-10-CM | POA: Insufficient documentation

## 2022-07-25 DIAGNOSIS — D6959 Other secondary thrombocytopenia: Secondary | ICD-10-CM | POA: Diagnosis not present

## 2022-07-25 DIAGNOSIS — E1122 Type 2 diabetes mellitus with diabetic chronic kidney disease: Secondary | ICD-10-CM | POA: Diagnosis not present

## 2022-07-25 DIAGNOSIS — N183 Chronic kidney disease, stage 3 unspecified: Secondary | ICD-10-CM | POA: Diagnosis not present

## 2022-07-25 DIAGNOSIS — I5032 Chronic diastolic (congestive) heart failure: Secondary | ICD-10-CM | POA: Diagnosis present

## 2022-07-25 DIAGNOSIS — K7581 Nonalcoholic steatohepatitis (NASH): Secondary | ICD-10-CM

## 2022-07-25 DIAGNOSIS — J449 Chronic obstructive pulmonary disease, unspecified: Secondary | ICD-10-CM | POA: Diagnosis not present

## 2022-07-25 DIAGNOSIS — R161 Splenomegaly, not elsewhere classified: Secondary | ICD-10-CM | POA: Diagnosis not present

## 2022-07-25 DIAGNOSIS — I130 Hypertensive heart and chronic kidney disease with heart failure and stage 1 through stage 4 chronic kidney disease, or unspecified chronic kidney disease: Secondary | ICD-10-CM | POA: Insufficient documentation

## 2022-07-25 DIAGNOSIS — Z79890 Hormone replacement therapy (postmenopausal): Secondary | ICD-10-CM | POA: Insufficient documentation

## 2022-07-25 DIAGNOSIS — I272 Pulmonary hypertension, unspecified: Secondary | ICD-10-CM | POA: Insufficient documentation

## 2022-07-25 DIAGNOSIS — Z79899 Other long term (current) drug therapy: Secondary | ICD-10-CM | POA: Insufficient documentation

## 2022-07-25 DIAGNOSIS — K7460 Unspecified cirrhosis of liver: Secondary | ICD-10-CM | POA: Diagnosis not present

## 2022-07-25 LAB — COMPREHENSIVE METABOLIC PANEL
ALT: 26 U/L (ref 0–44)
AST: 57 U/L — ABNORMAL HIGH (ref 15–41)
Albumin: 3.1 g/dL — ABNORMAL LOW (ref 3.5–5.0)
Alkaline Phosphatase: 99 U/L (ref 38–126)
Anion gap: 8 (ref 5–15)
BUN: 23 mg/dL (ref 8–23)
CO2: 28 mmol/L (ref 22–32)
Calcium: 9.1 mg/dL (ref 8.9–10.3)
Chloride: 99 mmol/L (ref 98–111)
Creatinine, Ser: 1.67 mg/dL — ABNORMAL HIGH (ref 0.61–1.24)
GFR, Estimated: 45 mL/min — ABNORMAL LOW (ref >60)
Glucose, Bld: 93 mg/dL (ref 70–99)
Potassium: 4.1 mmol/L (ref 3.5–5.1)
Sodium: 135 mmol/L (ref 135–145)
Total Bilirubin: 3.8 mg/dL — ABNORMAL HIGH (ref 0.3–1.2)
Total Protein: 6.7 g/dL (ref 6.5–8.1)

## 2022-07-25 LAB — CBC
HCT: 33.8 % — ABNORMAL LOW (ref 39.0–52.0)
Hemoglobin: 11 g/dL — ABNORMAL LOW (ref 13.0–17.0)
MCH: 28.5 pg (ref 26.0–34.0)
MCHC: 32.5 g/dL (ref 30.0–36.0)
MCV: 87.6 fL (ref 80.0–100.0)
Platelets: 62 10*3/uL — ABNORMAL LOW (ref 150–400)
RBC: 3.86 MIL/uL — ABNORMAL LOW (ref 4.22–5.81)
RDW: 17.2 % — ABNORMAL HIGH (ref 11.5–15.5)
WBC: 4.5 10*3/uL (ref 4.0–10.5)
nRBC: 0 % (ref 0.0–0.2)

## 2022-07-25 LAB — BRAIN NATRIURETIC PEPTIDE: B Natriuretic Peptide: 61.4 pg/mL (ref 0.0–100.0)

## 2022-07-25 MED ORDER — POTASSIUM CHLORIDE CRYS ER 20 MEQ PO TBCR: 40.0000 meq | EXTENDED_RELEASE_TABLET | Freq: Two times a day (BID) | ORAL | 3 refills | Status: DC

## 2022-07-25 MED ORDER — TORSEMIDE 40 MG PO TABS: ORAL_TABLET | ORAL | 3 refills | Status: DC

## 2022-07-25 NOTE — Progress Notes (Signed)
PCP: [facility] Cardiology: Dr. Diona Browner HF Cardiology: Dr. Shirlee Latch  67 y.o. with history of NASH cirrhosis, chronic diastolic CHF, DM2, CKD stage 3, and COPD was referred by Dr. Diona Browner for evaluation of CHF.  Patient has cirrhosis due to NASH with splenomegaly, thrombocytopenia, and h/o hepatic encephalopathy.  Liver biopsy in 1/23 showed stage IV cirrhosis.  Patient has struggled with volume overload.  He was followed in the heart failure program at [facility] before moving to [place].  RHC in 10/23 showed mildly elevated filling pressures with pulmonary venous hypertension.  Echo in 4/24 showed EF 55%, mild LVH, normal RV systolic function with mild RV enlargement.  He has had several recent admissions with CHF/anasarca and hepatic encephalopathy.  He has never had enough ascites for paracentesis.  He gets very short of breath at times, but this waxes and wanes.  When he notes peripheral edema, he will generally have worsening dyspnea.  Currently, no dyspnea walking on flat ground but he sleeps in recliner due to chronic orthopnea.  He is short of breath carrying groceries.  No chest pain.  No lightheadedness.   ECG (personally reviewed): NSR, 1st degree AVB 224 msec  PMH: 1. Cirrhosis: Due to NASH.  Splenomegaly, chronic thrombocytopenia, history of hepatic encephalopathy.  - Liver biopsy in 1/23 with stage IV cirrhosis.  2. Thrombocytopenia: Due to splenomegaly.  3. Hypothyroidism 4. HTN 5. Type 2 diabetes 6. CKD stage 3 7. COPD: Moderate obstruction on PFTs, prior smoker.  8. HFpEF: RHC (10/23) with mean RA 15, PA 36/20 mean 28, mean PCWP 19, PVR 1.2 WU, CI 2.65.  - Echo (4/24): EF 55%, mild LVH, normal RV systolic function with mild RV enlargement.   Labs (4/24): NH3 74, K 3.7, creatinine 1.31, BNP 47  SH: Lives in [place], single, quit smoking 10 years ago, no ETOH.   Family History  Problem Relation Age of Onset   Liver cancer Mother    Heart disease Mother     Aneurysm Sister    ROS: All systems reviewed and negative except as per HPI.   Current Outpatient Medications  Medication Sig Dispense Refill   acetaminophen (TYLENOL) 500 MG tablet Take 1,000 mg by mouth every 6 (six) hours as needed for moderate pain.     albuterol (VENTOLIN HFA) 108 (90 Base) MCG/ACT inhaler Inhale 2 puffs into the lungs every 6 (six) hours as needed for wheezing or shortness of breath. 18 g 2   carvedilol (COREG) 6.25 MG tablet Take 1 tablet (6.25 mg total) by mouth 2 (two) times daily with a meal. 180 tablet 3   gabapentin (NEURONTIN) 100 MG capsule Take 100 mg by mouth daily as needed (pain).     hydrOXYzine (VISTARIL) 50 MG capsule Take 1 capsule (50 mg total) by mouth 3 (three) times daily as needed for anxiety or nausea. 90 capsule 2   lactose free nutrition (BOOST) LIQD Take 237 mLs by mouth every evening.     lactulose (CHRONULAC) 10 GM/15ML solution Take 30 mLs (20 g total) by mouth 2 (two) times daily. 946 mL 1   levothyroxine (SYNTHROID) 175 MCG tablet Take 175 mcg by mouth daily before breakfast.     metolazone (ZAROXOLYN) 2.5 MG tablet Take 2.5 mg by mouth once a week.     ondansetron (ZOFRAN) 8 MG tablet Take by mouth every 8 (eight) hours as needed for nausea or vomiting.     pantoprazole (PROTONIX) 40 MG tablet Take 1 tablet (40 mg total) by  mouth daily. 90 tablet 3   rifaximin (XIFAXAN) 550 MG TABS tablet Take 1 tablet (550 mg total) by mouth 2 (two) times daily. 180 tablet 3   sertraline (ZOLOFT) 50 MG tablet Take 1 tablet (50 mg total) by mouth at bedtime. 90 tablet 3   spironolactone (ALDACTONE) 100 MG tablet Take 1 tablet (100 mg total) by mouth daily. 30 tablet 1   traZODone (DESYREL) 50 MG tablet Take 1 tablet (50 mg total) by mouth at bedtime. 90 tablet 3   potassium chloride SA (KLOR-CON M) 20 MEQ tablet Take 2 tablets (40 mEq total) by mouth 2 (two) times daily. 180 tablet 3   Torsemide 40 MG TABS Take 60 mg by mouth every morning AND 40 mg every  evening. 200 tablet 3   No current facility-administered medications for this encounter.   BP (!) 90/50   Pulse (!) 55   Wt (!) 168.8 kg (372 lb 3.2 oz)   SpO2 98%   BMI 47.79 kg/m  General: NAD Neck: JVP 8-9 cm with HJR, no thyromegaly or thyroid nodule.  Lungs: Clear to auscultation bilaterally with normal respiratory effort. CV: Nondisplaced PMI.  Heart regular S1/S2, no S3/S4, no murmur.  1+ edema 1/2 to knees bilaterally.  No carotid bruit.  Normal pedal pulses.  Abdomen: Soft, nontender, no hepatosplenomegaly, mild distention.  Skin: Intact without lesions or rashes.  Neurologic: Alert and oriented x 3.  Psych: Normal affect. Extremities: No clubbing or cyanosis.  HEENT: Normal.   Assessment/Plan: 1.  Chronic diastolic CHF: RHC in 10/23 with mildly elevated filling pressures and pulmonary venous hypertension.  Echo in 4/24 showed EF 55%, mild LVH, normal RV systolic function with mild RV enlargement.  Patient has struggled with volume retention in setting of diastolic CHF and cirrhosis. On exam today, he is volume overloaded with NYHA class II-III symptoms.  - Increase torsemide to 60 mg bid x 3 days then 60 qam/40 qpm. BMET/BNP today, BMET in 10 days.  - Avoid hypokalemia with h/o hepatic encephalopathy.  Will increase KCl to 40 mEq bid.  - I will see if we can get insurance approval for Cardiomems given several recent admissions with CHF. We discussed risks/benefits and he agrees to procedure.  - Continue spironolactone 100 mg daily.  He does not appear to have significant ascites on exam.  2. Cirrhosis: Stage IV by liver biopsy in 1/23.  Suspect due to NASH. He has h/o hepatic encephalopathy as well as splenomegaly with thrombocytopenia.  - Follows with GI in [place].  - CMET today.  3. Thrombocytopenia: Due to cirrhosis/splenomegaly.  - CBC today.  4. COPD: No longer smokes.  Moderate obstruction on PFTs.   Daniel Crawford 07/25/2022

## 2022-07-25 NOTE — Patient Instructions (Signed)
INCREASE Torsemide to 60 mg ( 3 Tabs) Twice daily for 3 days, then change to 60 mg ( 3 Tabs) in the morning and 40 mg in the evening.  INCREASE Potassium to 40 mEq Twice daily  Labs done today, your results will be available in MyChart, we will contact you for abnormal readings.  Repeat blood work at [facility] on 10 days  Your physician recommends that you schedule a follow-up appointment in: 3 Weeks  If you have any questions or concerns before your next appointment please send [place] a message through [place] or call our office at [phone].    TO LEAVE A MESSAGE FOR THE NURSE SELECT OPTION 2, PLEASE LEAVE A MESSAGE INCLUDING: YOUR NAME DATE OF BIRTH CALL BACK NUMBER REASON FOR CALL**this is important as we prioritize the call backs  YOU WILL RECEIVE A CALL BACK THE SAME DAY AS LONG AS YOU CALL BEFORE 4:00 PM  At the [facility], you and your health needs are our priority. As part of our continuing mission to provide you with exceptional heart care, we have created designated Provider Care Teams. These Care Teams include your primary Cardiologist (physician) and Advanced Practice Providers (APPs- Physician Assistants and Nurse Practitioners) who all work together to provide you with the care you need, when you need it.   You Chea see any of the following providers on your designated Care Team at your next follow up: Dr Arvilla Meres Dr Marca Ancona Dr. Marcos Eke, NP Robbie Lis, [place] [facility], [place] Brynda Peon, NP Karle Plumber, PharmD   Please be sure to bring in all your medications bottles to every appointment.    Thank you for choosing [facility]

## 2022-07-26 ENCOUNTER — Encounter (INDEPENDENT_AMBULATORY_CARE_PROVIDER_SITE_OTHER): Payer: Self-pay | Admitting: Gastroenterology

## 2022-07-26 ENCOUNTER — Ambulatory Visit (INDEPENDENT_AMBULATORY_CARE_PROVIDER_SITE_OTHER): Payer: Medicare HMO | Admitting: Gastroenterology

## 2022-07-26 VITALS — BP 122/72 | HR 71 | Temp 98.2°F | Ht 74.0 in | Wt 377.8 lb

## 2022-07-26 DIAGNOSIS — K7460 Unspecified cirrhosis of liver: Secondary | ICD-10-CM

## 2022-07-26 DIAGNOSIS — K7682 Hepatic encephalopathy: Secondary | ICD-10-CM | POA: Diagnosis not present

## 2022-07-26 DIAGNOSIS — R188 Other ascites: Secondary | ICD-10-CM

## 2022-07-26 DIAGNOSIS — R601 Generalized edema: Secondary | ICD-10-CM | POA: Diagnosis not present

## 2022-07-26 DIAGNOSIS — K7469 Other cirrhosis of liver: Secondary | ICD-10-CM

## 2022-07-26 NOTE — Progress Notes (Signed)
Katrinka Blazing, M.D. Gastroenterology & Hepatology [facility] [address] [place], [place] [address]  Primary Care Physician: [facility] [address] [place] [address]  I will communicate my assessment and recommendations to the referring MD via EMR.  Problems: NASH cirrhosis Recurrent anasarca Hepatic encephalopathy  History of Present Illness: Daniel Crawford is a 67 y.o. male with PMH NASH cirrhosis c/b  recurrent anasarca, HE, asthma, anxiety, CHF, CKD, depression, diabetes, hypertension, hypothyroidism, who presents for follow up of cirrhosis.  Patient was recently he was admitted for hepatic encephalopathy on 07/10/2022.  Patient had mild hepatic encephalopathy for which he was started on Xifaxan and lactulose.   Patient reports that his legs are slightly more swollen today as he has been sitting most of the day and not elevating his legs.  Overall, he states that he has been less swollen than before with his current diuretic regimen.  States his abdomen has not been getting any larger. The patient denies having any nausea, vomiting, fever, chills, hematochezia, melena, hematemesis, abdominal distention, abdominal pain, diarrhea, jaundice, pruritus or weight loss.  Patient was seen yesterday at the cardiology clinic (Dr. Shirlee Latch).  He was advised to increase his torsemide to 60 mg twice a day for 3 days and then to decrease to 60 mg in the morning and 40 mg at night.  Had labs performed yesterday.  CMP showed sodium 135, potassium 4.1, creatinine 1.67, albumin 3.1, AST 57, ALT 26, total bilirubin 3.8, CBC with platelets of 62,000, hemoglobin 11.0 and with WBC 4.5.  Patient has been previously referred for transplant evaluation but he was not considered an adequate candidate given his weight.  Cirrhosis related questions: Hematemesis/coffee ground emesis: No Abdominal pain: No Abdominal distention/worsening  ascitesNo Fever/chills: No Episodes of confusion/disorientation: Yes, currently on lactulose and rifaximin 550 mg twice a day Number of daily bowel movements: 2-3 times a day, takes it twice a day Taking diuretics?:  Yes, torsemide 60 mg twice daily and Aldactone 100 mg daily History of variceal bleeding: No Prior history of banding?: No Prior episodes of SBP: No Last time liver imaging was performed:12/23 -  [place] no masses Last AFP: 05/17/22 - 9.0 MELD score: 07/12/22 - 21 Currently consuming alcohol: No  Last EGD: 11/06/2019 - Normal esophagus. Dilated. - Portal hypertensive gastropathy. - Normal examined duodenum. - No specimens collected.   Last Colonoscopy:06/28/2021  - One medium size sessile polyp in the cecum. This polyp could not be removed because of poor approach. - Five 4 to 7 mm polyps in the transverse colon and at the hepatic flexure, removed with a cold snare. Resected and retrieved. - External and internal hemorrhoids.   Path: All polyps were tubular adenomas  Past Medical History: Past Medical History:  Diagnosis Date   Anemia    Anxiety    Asthma    CHF (congestive heart failure) (HCC)    CKD (chronic kidney disease)    Depression    Diabetes mellitus without complication (HCC)    Dyspnea    Hypertension    Hypothyroidism    Liver cirrhosis secondary to NASH (HCC)    NASH (nonalcoholic steatohepatitis)    Pre-diabetes    Spleen enlarged    Thrombocytopenia [facility])     Past Surgical History: Past Surgical History:  Procedure Laterality Date   Bilateral hernia surgery     2006, 2007   CHOLECYSTECTOMY     2016    COLONOSCOPY WITH PROPOFOL N/A  06/28/2021   Procedure: COLONOSCOPY WITH PROPOFOL;  Surgeon: Malissa Hippo, MD;  [facility];  Service: Endoscopy;  Laterality: N/A;  1020   ESOPHAGEAL DILATION N/A 11/06/2019   Procedure: ESOPHAGEAL DILATION;  Surgeon: Dolores Frame, MD;  Location: [facility];  Service:  Gastroenterology;  Laterality: N/A;   ESOPHAGOGASTRODUODENOSCOPY (EGD) WITH PROPOFOL N/A 11/06/2019   Procedure: ESOPHAGOGASTRODUODENOSCOPY (EGD) WITH PROPOFOL;  Surgeon: Dolores Frame, MD;  Location: [facility];  Service: Gastroenterology;  Laterality: N/A;  1045   IR RADIOLOGIST EVAL & MGMT  02/19/2022   LAPAROSCOPIC ASSISTED VENTRAL HERNIA REPAIR     Polyp removed     in January of 2018.    POLYPECTOMY  06/28/2021   Procedure: POLYPECTOMY INTESTINAL;  Surgeon: Malissa Hippo, MD;  Location: [facility];  Service: Endoscopy;;   RIGHT HEART CATH N/A 01/08/2022   Procedure: RIGHT HEART CATH;  Surgeon: Corky Crafts, MD;  Location: [facility] INVASIVE CV LAB;  Service: Cardiovascular;  Laterality: N/A;    Family History: Family History  Problem Relation Age of Onset   Liver cancer Mother    Heart disease Mother    Aneurysm Sister     Social History: Social History   Tobacco Use  Smoking Status Former   Packs/day: 1.00   Years: 20.00   Additional pack years: 0.00   Total pack years: 20.00   Types: Cigarettes   Quit date: 2017   Years since quitting: 7.3   Passive exposure: Current  Smokeless Tobacco Never   Social History   Substance and Sexual Activity  Alcohol Use No   Social History   Substance and Sexual Activity  Drug Use No    Allergies: No Known Allergies  Medications: Current Outpatient Medications  Medication Sig Dispense Refill   acetaminophen (TYLENOL) 500 MG tablet Take 1,000 mg by mouth every 6 (six) hours as needed for moderate pain.     albuterol (VENTOLIN HFA) 108 (90 Base) MCG/ACT inhaler Inhale 2 puffs into the lungs every 6 (six) hours as needed for wheezing or shortness of breath. 18 g 2   carvedilol (COREG) 6.25 MG tablet Take 1 tablet (6.25 mg total) by mouth 2 (two) times daily with a meal. 180 tablet 3   gabapentin (NEURONTIN) 100 MG capsule Take 100 mg by mouth daily as needed (pain).     hydrOXYzine (VISTARIL) 50 MG  capsule Take 1 capsule (50 mg total) by mouth 3 (three) times daily as needed for anxiety or nausea. 90 capsule 2   lactose free nutrition (BOOST) LIQD Take 237 mLs by mouth every evening.     lactulose (CHRONULAC) 10 GM/15ML solution Take 30 mLs (20 g total) by mouth 2 (two) times daily. 946 mL 1   levothyroxine (SYNTHROID) 175 MCG tablet Take 175 mcg by mouth daily before breakfast.     ondansetron (ZOFRAN) 8 MG tablet Take by mouth every 8 (eight) hours as needed for nausea or vomiting.     pantoprazole (PROTONIX) 40 MG tablet Take 1 tablet (40 mg total) by mouth daily. 90 tablet 3   potassium chloride SA (KLOR-CON M) 20 MEQ tablet Take 2 tablets (40 mEq total) by mouth 2 (two) times daily. 180 tablet 3   rifaximin (XIFAXAN) 550 MG TABS tablet Take 1 tablet (550 mg total) by mouth 2 (two) times daily. 180 tablet 3   sertraline (ZOLOFT) 50 MG tablet Take 1 tablet (50 mg total) by mouth at bedtime. 90 tablet 3  spironolactone (ALDACTONE) 100 MG tablet Take 1 tablet (100 mg total) by mouth daily. 30 tablet 1   Torsemide 40 MG TABS Take 60 mg by mouth every morning AND 40 mg every evening. 200 tablet 3   traZODone (DESYREL) 50 MG tablet Take 1 tablet (50 mg total) by mouth at bedtime. 90 tablet 3   metolazone (ZAROXOLYN) 2.5 MG tablet Take 2.5 mg by mouth once a week. (Patient not taking: Reported on 07/26/2022)     No current facility-administered medications for this visit.    Review of Systems: GENERAL: negative for malaise, night sweats HEENT: No changes in hearing or vision, no nose bleeds or other nasal problems. NECK: Negative for lumps, goiter, pain and significant neck swelling RESPIRATORY: Negative for cough, wheezing CARDIOVASCULAR: Negative for chest pain, leg swelling, palpitations, orthopnea GI: SEE HPI MUSCULOSKELETAL: Negative for joint pain or swelling, back pain, and muscle pain. SKIN: Negative for lesions, rash PSYCH: Negative for sleep disturbance, mood disorder and recent  psychosocial stressors. HEMATOLOGY Negative for prolonged bleeding, bruising easily, and swollen nodes. ENDOCRINE: Negative for cold or heat intolerance, polyuria, polydipsia and goiter. NEURO: negative for tremor, gait imbalance, syncope and seizures. The remainder of the review of systems is noncontributory.   Physical Exam: BP 122/72 (BP Location: Left Arm, Patient Position: Sitting, Cuff Size: Large)   Pulse 71   Temp 98.2 F (36.8 C) (Oral)   Ht 6\' 2"  (1.88 m)   Wt (!) 377 lb 12.8 oz (171.4 kg)   BMI 48.51 kg/m  GENERAL: The patient is AO x3, in no acute distress. Obese.  HEENT: Head is normocephalic and atraumatic. EOMI are intact. Mouth is well hydrated and without lesions. NECK: Supple. No masses LUNGS: Clear to auscultation. No presence of rhonchi/wheezing/rales. Adequate chest expansion HEART: RRR, normal s1 and s2. ABDOMEN: Soft, nontender, no guarding, no peritoneal signs, and nondistended but some wall edema is present. BS +. No masses. EXTREMITIES: Without any cyanosis, clubbing, rash, lesions. Has +1 LE edema up to both mid shins NEUROLOGIC: AOx3, no focal motor deficit. SKIN: no jaundice, no rashes  Imaging/Labs: as above  I personally reviewed and interpreted the available labs, imaging and endoscopic files.  Impression and Plan: Daniel Crawford is a 67 y.o. male with PMH NASH cirrhosis c/b  recurrent anasarca, HE, asthma, anxiety, CHF, CKD, depression, diabetes, hypertension, hypothyroidism, who presents for follow up of cirrhosis.  The patient had a history of decompensated liver cirrhosis due to NASH.  He has been very challenging to manage his fluid status given his worsening renal function with up titration of his diuretics, as well as difficulty following instructions provided during previous hospitalizations.  At this moment he is presenting significant improvement in his volume status but he is still presenting some peripheral edema.  Hopefully this will improve with  the current diuretic regimen recommended by cardiology.  Will continue with same diuretics for now.  If on follow-up he is found to be more euvolemic, we will schedule him for EGD and colonoscopy for variceal screening and to attempt removal of cecal polyp that could not be removed in the past.  The patient had previous episodes of hepatic encephalopathy that have been well compensated with combination of lactulose and Xifaxan.  Currently not present any asterixis.  Will continue him on same regimen.  With an [facility] screening, he is up-to-date with his ultrasound screening.  We will determine if his volume status is better next appointment and schedule him for an ultrasound if so.  -  Follow up with cardiology and pulmonology, request clearance for EGD and colonoscopy - Will schedule EGD, colonoscopy and liver US in next appointment if volume status is optimized - Continue lactulose to achieve 2-3 Bms per day - Continue Xifaxan 550 mg twice a day - Continue spironolactone 100 mg qday - Continue torsemide per cardiology recs: torsemide to 60 mg twice a day for 3 days and then to decrease to 60 mg in the morning and 40 mg at night. - Reduce salt intake to <2 g per day - Can take Tylenol max of 2 g per day (650 mg q8h) for pain - Avoid NSAIDs for pain - Avoid eating raw oysters/shellfish - Protein shake (Ensure or Boost) every night before going to sleep  All questions were answered.      Katrinka Blazing, MD Gastroenterology and Hepatology [facility]

## 2022-07-26 NOTE — Patient Instructions (Addendum)
-   Follow up with cardiology and pulmonology, request clearance for EGD and colonoscopy - Will schedule EGD, colonoscopy and liver US in next appointment if volume status is optimized - Continue lactulose to achieve 2-3 Bms per day - Continue Xifaxan 550 mg twice a day - Continue spironolactone 100 mg qday - Continue torsemide per cardiology recs: torsemide to 60 mg twice a day for 3 days and then to decrease to 60 mg in the morning and 40 mg at night. - Reduce salt intake to <2 g per day - Can take Tylenol max of 2 g per day (650 mg q8h) for pain - Avoid NSAIDs for pain - Avoid eating raw oysters/shellfish - Protein shake (Ensure or Boost) every night before going to sleep

## 2022-08-02 DIAGNOSIS — Z713 Dietary counseling and surveillance: Secondary | ICD-10-CM | POA: Diagnosis not present

## 2022-08-02 DIAGNOSIS — Z6841 Body Mass Index (BMI) 40.0 and over, adult: Secondary | ICD-10-CM | POA: Diagnosis not present

## 2022-08-02 DIAGNOSIS — Z7182 Exercise counseling: Secondary | ICD-10-CM | POA: Diagnosis not present

## 2022-08-02 DIAGNOSIS — K7460 Unspecified cirrhosis of liver: Secondary | ICD-10-CM | POA: Diagnosis not present

## 2022-08-07 NOTE — Progress Notes (Signed)
PCP: [facility], [facility] Cardiology: Dr. Diona Browner HF Cardiology: Dr. Shirlee Latch  67 y.o. with history of NASH cirrhosis, chronic diastolic CHF, DM2, CKD stage 3, and COPD was referred by Dr. Diona Browner for evaluation of CHF.  Patient has cirrhosis due to NASH with splenomegaly, thrombocytopenia, and h/o hepatic encephalopathy.  Liver biopsy in 1/23 showed stage IV cirrhosis.  Patient has struggled with volume overload.  He was followed in the heart failure program at [facility] before moving to [place].  RHC in 10/23 showed mildly elevated filling pressures with pulmonary venous hypertension.  Echo in 4/24 showed EF 55%, mild LVH, normal RV systolic function with mild RV enlargement.  He has had several recent admissions with CHF/anasarca and hepatic encephalopathy.  He has never had enough ascites for paracentesis.  Initial visit with Dr. Shirlee Latch 5/24, NYHA II-III symptoms. Discussed Cardiomems.   Today he returns for HF follow up. Overall feeling fine. Leg swelling has improved. He is not SOB walking on flat ground. Main complaint today is nausea. Denies palpitations, CP, dizziness, edema, or PND/Orthopnea. Sleeps in recliner x several months. Appetite ok. No fever or chills. Weight at home 355 pounds. Taking all medications. Take extra 20 mg torsemide 3x/week. He snores and has generalized fatigue, had a sleep study years ago.   ECG (personally reviewed): none ordered today.  Labs (4/24): NH3 74, K 3.7, creatinine 1.31, BNP 47 Labs (5/24): K 4.1, creatinine 1.67  PMH: 1. Cirrhosis: Due to NASH.  Splenomegaly, chronic thrombocytopenia, history of hepatic encephalopathy.  - Liver biopsy in 1/23 with stage IV cirrhosis.  2. Thrombocytopenia: Due to splenomegaly.  3. Hypothyroidism 4. HTN 5. Type 2 diabetes 6. CKD stage 3 7. COPD: Moderate obstruction on PFTs, prior smoker.  8. HFpEF: RHC (10/23) with mean RA 15, PA 36/20 mean 28, mean PCWP 19, PVR 1.2 WU, CI 2.65.  - Echo (4/24): EF 55%,  mild LVH, normal RV systolic function with mild RV enlargement.   SH: Lives in [place], single, quit smoking 10 years ago, no ETOH.   Family History  Problem Relation Age of Onset   Liver cancer Mother    Heart disease Mother    Aneurysm Sister    ROS: All systems reviewed and negative except as per HPI.   Current Outpatient Medications  Medication Sig Dispense Refill   acetaminophen (TYLENOL) 500 MG tablet Take 1,000 mg by mouth every 6 (six) hours as needed for moderate pain.     albuterol (VENTOLIN HFA) 108 (90 Base) MCG/ACT inhaler Inhale 2 puffs into the lungs every 6 (six) hours as needed for wheezing or shortness of breath. 18 g 2   carvedilol (COREG) 6.25 MG tablet Take 1 tablet (6.25 mg total) by mouth 2 (two) times daily with a meal. 180 tablet 3   gabapentin (NEURONTIN) 100 MG capsule Take 100 mg by mouth daily as needed (pain).     hydrOXYzine (VISTARIL) 50 MG capsule Take 1 capsule (50 mg total) by mouth 3 (three) times daily as needed for anxiety or nausea. 90 capsule 2   lactose free nutrition (BOOST) LIQD Take 237 mLs by mouth every evening.     lactulose (CHRONULAC) 10 GM/15ML solution Take 30 mLs (20 g total) by mouth 2 (two) times daily. 946 mL 1   levothyroxine (SYNTHROID) 175 MCG tablet Take 175 mcg by mouth daily before breakfast.     ondansetron (ZOFRAN) 8 MG tablet Take by mouth every 8 (eight) hours as needed for nausea or vomiting.  pantoprazole (PROTONIX) 40 MG tablet Take 1 tablet (40 mg total) by mouth daily. 90 tablet 3   potassium chloride SA (KLOR-CON M) 20 MEQ tablet Take 2 tablets (40 mEq total) by mouth 2 (two) times daily. 180 tablet 3   rifaximin (XIFAXAN) 550 MG TABS tablet Take 1 tablet (550 mg total) by mouth 2 (two) times daily. 180 tablet 3   sertraline (ZOLOFT) 50 MG tablet Take 1 tablet (50 mg total) by mouth at bedtime. 90 tablet 3   spironolactone (ALDACTONE) 100 MG tablet Take 1 tablet (100 mg total) by mouth daily. 30 tablet 1   torsemide  (DEMADEX) 20 MG tablet Patient takes 3 tablets by mouth daily and takes 1 extra tablet as needed.     traZODone (DESYREL) 50 MG tablet Take 1 tablet (50 mg total) by mouth at bedtime. 90 tablet 3   No current facility-administered medications for this encounter.   Wt Readings from Last 3 Encounters:  08/14/22 (!) 163.7 kg (360 lb 12.8 oz)  07/26/22 (!) 171.4 kg (377 lb 12.8 oz)  07/25/22 (!) 168.8 kg (372 lb 3.2 oz)   BP 102/62   Pulse 69   Wt (!) 163.7 kg (360 lb 12.8 oz)   SpO2 96%   BMI 46.32 kg/m  Physical Exam General:  NAD. No resp difficulty, walked into clinic HEENT: Normal Neck: Supple. No JVD. Carotids 2+ bilat; no bruits. No lymphadenopathy or thryomegaly appreciated. Cor: PMI nondisplaced. Regular rate & rhythm. No rubs, gallops or murmurs. Lungs: Clear Abdomen: Soft, obese,  nontender, nondistended. No hepatosplenomegaly. No bruits or masses. Good bowel sounds. Extremities: No cyanosis, clubbing, rash, trace pedal edema Neuro: Alert & oriented x 3, cranial nerves grossly intact. Moves all 4 extremities w/o difficulty. Affect pleasant.  Assessment/Plan: 1.  Chronic diastolic CHF: RHC in 10/23 with mildly elevated filling pressures and pulmonary venous hypertension.  Echo in 4/24 showed EF 55%, mild LVH, normal RV systolic function with mild RV enlargement.  Patient has struggled with volume retention in setting of diastolic CHF and cirrhosis. On exam today, he is not volume overloaded, weight down 17 lbs. NYHA class II-III symptoms.  - Continue torsemide 60 mg daily, takes extra 20 mg 3x/week.  BMET/BNP today. - Continue KCL 40 mEq bid (Avoid hypokalemia with h/o hepatic encephalopathy).  - Continue spironolactone 100 mg daily.  He does not appear to have significant ascites on exam.  - Will see if we can get insurance approval for Cardiomems given several recent admissions with CHF. We discussed risks/benefits and he agrees to procedure.  - Given Rx for compression  hose. 2. Cirrhosis: Stage IV by liver biopsy in 1/23.  Suspect due to NASH. He has h/o hepatic encephalopathy as well as splenomegaly with thrombocytopenia.  - Follows with GI in [place].  - Recent LFTs stable. 3. Thrombocytopenia: Due to cirrhosis/splenomegaly. No bleeding issues. - Most recent PLT 62 (5/24) 4. COPD: No longer smokes.  Moderate obstruction on PFTs.  5. Snoring: We discussed sleep study today, however he declines.  Follow up in 3 months with Dr. Shirlee Latch.  Anderson [facility] FNP-BC 08/14/2022

## 2022-08-08 ENCOUNTER — Institutional Professional Consult (permissible substitution): Payer: Medicare HMO | Admitting: Pulmonary Disease

## 2022-08-14 ENCOUNTER — Ambulatory Visit (HOSPITAL_COMMUNITY)
Admission: RE | Admit: 2022-08-14 | Discharge: 2022-08-14 | Disposition: A | Discharge status: Home / Self Care | Source: Ambulatory Visit | Attending: Family Medicine | Admitting: Family Medicine

## 2022-08-14 ENCOUNTER — Encounter (HOSPITAL_COMMUNITY): Payer: Self-pay

## 2022-08-14 VITALS — BP 102/62 | HR 69 | Wt 360.8 lb

## 2022-08-14 DIAGNOSIS — I130 Hypertensive heart and chronic kidney disease with heart failure and stage 1 through stage 4 chronic kidney disease, or unspecified chronic kidney disease: Secondary | ICD-10-CM | POA: Insufficient documentation

## 2022-08-14 DIAGNOSIS — I5032 Chronic diastolic (congestive) heart failure: Secondary | ICD-10-CM | POA: Diagnosis not present

## 2022-08-14 DIAGNOSIS — D696 Thrombocytopenia, unspecified: Secondary | ICD-10-CM | POA: Diagnosis not present

## 2022-08-14 DIAGNOSIS — E1122 Type 2 diabetes mellitus with diabetic chronic kidney disease: Secondary | ICD-10-CM | POA: Insufficient documentation

## 2022-08-14 DIAGNOSIS — K7581 Nonalcoholic steatohepatitis (NASH): Secondary | ICD-10-CM

## 2022-08-14 DIAGNOSIS — K7460 Unspecified cirrhosis of liver: Secondary | ICD-10-CM | POA: Diagnosis not present

## 2022-08-14 DIAGNOSIS — N183 Chronic kidney disease, stage 3 unspecified: Secondary | ICD-10-CM | POA: Diagnosis not present

## 2022-08-14 DIAGNOSIS — D6959 Other secondary thrombocytopenia: Secondary | ICD-10-CM | POA: Diagnosis not present

## 2022-08-14 DIAGNOSIS — J449 Chronic obstructive pulmonary disease, unspecified: Secondary | ICD-10-CM

## 2022-08-14 DIAGNOSIS — I272 Pulmonary hypertension, unspecified: Secondary | ICD-10-CM | POA: Diagnosis not present

## 2022-08-14 DIAGNOSIS — R0683 Snoring: Secondary | ICD-10-CM | POA: Diagnosis not present

## 2022-08-14 DIAGNOSIS — Z87891 Personal history of nicotine dependence: Secondary | ICD-10-CM | POA: Diagnosis not present

## 2022-08-14 DIAGNOSIS — R161 Splenomegaly, not elsewhere classified: Secondary | ICD-10-CM | POA: Diagnosis not present

## 2022-08-14 DIAGNOSIS — Z79899 Other long term (current) drug therapy: Secondary | ICD-10-CM | POA: Insufficient documentation

## 2022-08-14 DIAGNOSIS — K7469 Other cirrhosis of liver: Secondary | ICD-10-CM

## 2022-08-14 LAB — BASIC METABOLIC PANEL
Anion gap: 7 (ref 5–15)
BUN: 19 mg/dL (ref 8–23)
CO2: 30 mmol/L (ref 22–32)
Calcium: 8.9 mg/dL (ref 8.9–10.3)
Chloride: 97 mmol/L — ABNORMAL LOW (ref 98–111)
Creatinine, Ser: 1.66 mg/dL — ABNORMAL HIGH (ref 0.61–1.24)
GFR, Estimated: 46 mL/min — ABNORMAL LOW (ref >60)
Glucose, Bld: 167 mg/dL — ABNORMAL HIGH (ref 70–99)
Potassium: 4.8 mmol/L (ref 3.5–5.1)
Sodium: 134 mmol/L — ABNORMAL LOW (ref 135–145)

## 2022-08-14 LAB — BRAIN NATRIURETIC PEPTIDE: B Natriuretic Peptide: 22.8 pg/mL (ref 0.0–100.0)

## 2022-08-14 NOTE — Patient Instructions (Signed)
It was great to see you today! No medication changes are needed at this time.  Labs today We will only contact you if something comes back abnormal or we need to make some changes. Otherwise no news is good news!  Please wear your compression hose daily, place them on as soon as you get up in the morning and remove before you go to bed at night.  The CardioMEMS System consists of a small pressure-sensing device that is implanted directly into your pulmonary artery. Once implanted, the sensor measures and transmits your blood flow pressure and heart rate.  Your physician recommends that you schedule a follow-up appointment in: 3-4 months with Dr Shirlee Latch  Do the following things EVERYDAY: Weigh yourself in the morning before breakfast. Write it down and keep it in a log. Take your medicines as prescribed Eat low salt foods--Limit salt (sodium) to 2000 mg per day.  Stay as active as you can everyday Limit all fluids for the day to less than 2 liters At the [facility], you and your health needs are our priority. As part of our continuing mission to provide you with exceptional heart care, we have created designated Provider Care Teams. These Care Teams include your primary Cardiologist (physician) and Advanced Practice Providers (APPs- Physician Assistants and Nurse Practitioners) who all work together to provide you with the care you need, when you need it.   You Foulk see any of the following providers on your designated Care Team at your next follow up: Dr Arvilla Meres Dr Marca Ancona Dr. Marcos Eke, NP Robbie Lis, [place] [facility] [place], [place] Brynda Peon, NP Karle Plumber, PharmD   Please be sure to bring in all your medications bottles to every appointment.    Thank you for choosing [facility]   If you have any questions or concerns before your next appointment please send [place] a message  through [place] or call our office at [phone].    TO LEAVE A MESSAGE FOR THE NURSE SELECT OPTION 2, PLEASE LEAVE A MESSAGE INCLUDING: YOUR NAME DATE OF BIRTH CALL BACK NUMBER REASON FOR CALL**this is important as we prioritize the call backs  YOU WILL RECEIVE A CALL BACK THE SAME DAY AS LONG AS YOU CALL BEFORE 4:00 PM

## 2022-08-27 DIAGNOSIS — N1831 Chronic kidney disease, stage 3a: Secondary | ICD-10-CM | POA: Diagnosis not present

## 2022-08-27 DIAGNOSIS — K7460 Unspecified cirrhosis of liver: Secondary | ICD-10-CM | POA: Diagnosis not present

## 2022-08-27 DIAGNOSIS — E8770 Fluid overload, unspecified: Secondary | ICD-10-CM | POA: Diagnosis not present

## 2022-08-30 DIAGNOSIS — N182 Chronic kidney disease, stage 2 (mild): Secondary | ICD-10-CM | POA: Diagnosis not present

## 2022-08-30 DIAGNOSIS — Z713 Dietary counseling and surveillance: Secondary | ICD-10-CM | POA: Diagnosis not present

## 2022-08-30 DIAGNOSIS — Z7409 Other reduced mobility: Secondary | ICD-10-CM | POA: Diagnosis not present

## 2022-08-30 DIAGNOSIS — R110 Nausea: Secondary | ICD-10-CM | POA: Diagnosis not present

## 2022-08-30 DIAGNOSIS — K7290 Hepatic failure, unspecified without coma: Secondary | ICD-10-CM | POA: Diagnosis not present

## 2022-08-30 DIAGNOSIS — Z7182 Exercise counseling: Secondary | ICD-10-CM | POA: Diagnosis not present

## 2022-08-30 DIAGNOSIS — K769 Liver disease, unspecified: Secondary | ICD-10-CM | POA: Diagnosis not present

## 2022-08-30 DIAGNOSIS — I509 Heart failure, unspecified: Secondary | ICD-10-CM | POA: Diagnosis not present

## 2022-08-30 DIAGNOSIS — F329 Major depressive disorder, single episode, unspecified: Secondary | ICD-10-CM | POA: Diagnosis not present

## 2022-09-04 DIAGNOSIS — E1140 Type 2 diabetes mellitus with diabetic neuropathy, unspecified: Secondary | ICD-10-CM | POA: Diagnosis not present

## 2022-09-04 DIAGNOSIS — N182 Chronic kidney disease, stage 2 (mild): Secondary | ICD-10-CM | POA: Diagnosis not present

## 2022-09-04 DIAGNOSIS — E785 Hyperlipidemia, unspecified: Secondary | ICD-10-CM | POA: Diagnosis not present

## 2022-09-04 DIAGNOSIS — N529 Male erectile dysfunction, unspecified: Secondary | ICD-10-CM | POA: Diagnosis not present

## 2022-09-04 DIAGNOSIS — F419 Anxiety disorder, unspecified: Secondary | ICD-10-CM | POA: Diagnosis not present

## 2022-09-04 DIAGNOSIS — I509 Heart failure, unspecified: Secondary | ICD-10-CM | POA: Diagnosis not present

## 2022-09-04 DIAGNOSIS — E1122 Type 2 diabetes mellitus with diabetic chronic kidney disease: Secondary | ICD-10-CM | POA: Diagnosis not present

## 2022-09-04 DIAGNOSIS — E1136 Type 2 diabetes mellitus with diabetic cataract: Secondary | ICD-10-CM | POA: Diagnosis not present

## 2022-09-04 DIAGNOSIS — M1990 Unspecified osteoarthritis, unspecified site: Secondary | ICD-10-CM | POA: Diagnosis not present

## 2022-09-04 DIAGNOSIS — E039 Hypothyroidism, unspecified: Secondary | ICD-10-CM | POA: Diagnosis not present

## 2022-09-04 DIAGNOSIS — K219 Gastro-esophageal reflux disease without esophagitis: Secondary | ICD-10-CM | POA: Diagnosis not present

## 2022-09-06 ENCOUNTER — Encounter (INDEPENDENT_AMBULATORY_CARE_PROVIDER_SITE_OTHER): Payer: Self-pay | Admitting: Gastroenterology

## 2022-09-06 ENCOUNTER — Ambulatory Visit (INDEPENDENT_AMBULATORY_CARE_PROVIDER_SITE_OTHER): Payer: Medicare HMO | Admitting: Gastroenterology

## 2022-09-06 VITALS — BP 125/78 | HR 78 | Temp 95.9°F | Ht 73.0 in | Wt 380.8 lb

## 2022-09-06 DIAGNOSIS — K7469 Other cirrhosis of liver: Secondary | ICD-10-CM

## 2022-09-06 NOTE — Patient Instructions (Signed)
We will update US of the liver  You will need to be seen by the lung doctor prior toEGD/Colon, please schedule an appt with Dr. Vassie Loll Please reach out to Dr. Shirlee Latch regarding more swelling in your legs  Follow 2g sodium diet  For now Continue with torsemide 60mg  +20mg  as needed  Continue spironolactone 100mg  daily  - Reduce salt intake to <2 g per day - Can take Tylenol max of 2 g per day (650 mg q8h) for pain - Avoid NSAIDs for pain - Avoid eating raw oysters/shellfish - Ensure every night before going to sleep  Follow up 6 weeks

## 2022-09-06 NOTE — Progress Notes (Unsigned)
Referring Provider: [facility], Miguel Aschoff* Primary Care Physician:  [facility], [facility] Primary GI Physician: Levon Hedger   Chief Complaint  Patient presents with   Follow-up    Patient here today for a follow up. Patient says he was here today to discuss the need for both egd and tcs. Patient denies any current gi issues. He is taking Lactulose 30 ml two times a week. Has between three or four bm per day.He is taking Xifaxan 550 mg bid. On Torsemide 40 mg - 60 mg per day, He takes Spironolactone 100 mg once per day. He is also taking Coreg 6.25 bid.Denies any confusion, and denies any abdominal swelling.    HPI:   Daniel Crawford is a 67 y.o. male with past medical history of  NASH cirrhosis c/b  recurrent anasarca, HE, asthma, anxiety, CHF, CKD, depression, diabetes, hypertension, hypothyroidism   Patient presenting today for follow up of cirrhosis  Last seen [facility] 2024, at that time, recently he was admitted for hepatic encephalopathy on 07/10/2022. Patient had mild hepatic encephalopathy for which he was started on Xifaxan and lactulose.   At last OV legs are slightly more swollen, Overall, he states that he has been less swollen than before with his current diuretic regimen.  No overt ascites. Patient was seen recently at cardiology clinic (Dr. Shirlee Latch). Had increase his torsemide to 60 mg twice a day for 3 days and then to decrease to 60 mg in the morning and 40 mg at night.  previously referred for transplant evaluation but he was not considered an adequate candidate given his weight.   Recommended to follow up with Cards/pulm for EGD/colon, continue lactulose, xifaxan, spironolactone 100mg  daily, torsemide 60mg  daily with 20mg  PRN  Present: He notes more swelling in his legs recently though does feel it is some better than previously. Feels abdominal swelling is about the same though abdomen is not getting tight. No SOB unless he is out getting exerted. Has supplemental O2 he  can use PRN. No episodes of confusion. Having 3-4 BMs per day. Doing lactulose twice a week, xifaxan twice a day.   He is 380 lbs today, 360 lbs at last visit. He is adhering to a low sodium diet   Cirrhosis related questions: Hematemesis/coffee ground emesis: No Abdominal pain: No Abdominal distention/worsening ascites: No Fever/chills: No Episodes of confusion/disorientation: Yes, currently on lactulose and rifaximin 550 mg twice a day Number of daily bowel movements: 3-4 a day  Taking diuretics?:  Yes, torsemide 60 mg twice daily, extra 20mg  1-2x/week and Aldactone 100 mg daily History of variceal bleeding: No Prior history of banding?: No Prior episodes of SBP: No Last time liver imaging was performed:12/23 -  [place] no masses Last AFP: 05/17/22 - 9.0 MELD score: 07/12/22 - 21 Currently consuming alcohol: No   Last EGD: 11/06/2019 - Normal esophagus. Dilated. - Portal hypertensive gastropathy. - Normal examined duodenum. - No specimens collected.   Last Colonoscopy:06/28/2021  - One medium size sessile polyp in the cecum. This polyp could not be removed because of poor approach. - Five 4 to 7 mm polyps in the transverse colon and at the hepatic flexure, removed with a cold snare. Resected and retrieved. - External and internal hemorrhoids.   Path: All polyps were tubular adenomas   Past Medical History:  Diagnosis Date   Anemia    Anxiety    Asthma    CHF (congestive heart failure) (HCC)    CKD (chronic kidney disease)    Depression  Diabetes mellitus without complication (HCC)    Dyspnea    Hypertension    Hypothyroidism    Liver cirrhosis secondary to NASH (HCC)    NASH (nonalcoholic steatohepatitis)    Pre-diabetes    Spleen enlarged    Thrombocytopenia [facility])     Past Surgical History:  Procedure Laterality Date   Bilateral hernia surgery     2006, 2007   CHOLECYSTECTOMY     2016    COLONOSCOPY WITH PROPOFOL N/A 06/28/2021   Procedure: COLONOSCOPY WITH  PROPOFOL;  Surgeon: Malissa Hippo, MD;  [facility];  Service: Endoscopy;  Laterality: N/A;  1020   ESOPHAGEAL DILATION N/A 11/06/2019   Procedure: ESOPHAGEAL DILATION;  Surgeon: Dolores Frame, MD;  Location: [facility];  Service: Gastroenterology;  Laterality: N/A;   ESOPHAGOGASTRODUODENOSCOPY (EGD) WITH PROPOFOL N/A 11/06/2019   Procedure: ESOPHAGOGASTRODUODENOSCOPY (EGD) WITH PROPOFOL;  Surgeon: Dolores Frame, MD;  Location: [facility];  Service: Gastroenterology;  Laterality: N/A;  1045   IR RADIOLOGIST EVAL & MGMT  02/19/2022   LAPAROSCOPIC ASSISTED VENTRAL HERNIA REPAIR     Polyp removed     in January of 2018.    POLYPECTOMY  06/28/2021   Procedure: POLYPECTOMY INTESTINAL;  Surgeon: Malissa Hippo, MD;  Location: [facility];  Service: Endoscopy;;   RIGHT HEART CATH N/A 01/08/2022   Procedure: RIGHT HEART CATH;  Surgeon: Corky Crafts, MD;  Location: [facility] INVASIVE CV LAB;  Service: Cardiovascular;  Laterality: N/A;    Current Outpatient Medications  Medication Sig Dispense Refill   acetaminophen (TYLENOL) 500 MG tablet Take 1,000 mg by mouth every 6 (six) hours as needed for moderate pain.     albuterol (VENTOLIN HFA) 108 (90 Base) MCG/ACT inhaler Inhale 2 puffs into the lungs every 6 (six) hours as needed for wheezing or shortness of breath. 18 g 2   carvedilol (COREG) 6.25 MG tablet Take 1 tablet (6.25 mg total) by mouth 2 (two) times daily with a meal. 180 tablet 3   gabapentin (NEURONTIN) 100 MG capsule Take 100 mg by mouth daily as needed (pain).     hydrOXYzine (VISTARIL) 50 MG capsule Take 1 capsule (50 mg total) by mouth 3 (three) times daily as needed for anxiety or nausea. 90 capsule 2   lactose free nutrition (BOOST) LIQD Take 237 mLs by mouth every evening.     lactulose (CHRONULAC) 10 GM/15ML solution Take 30 mLs (20 g total) by mouth 2 (two) times daily. 946 mL 1   levothyroxine (SYNTHROID) 175 MCG tablet Take 175 mcg by  mouth daily before breakfast.     ondansetron (ZOFRAN) 8 MG tablet Take by mouth every 8 (eight) hours as needed for nausea or vomiting.     pantoprazole (PROTONIX) 40 MG tablet Take 1 tablet (40 mg total) by mouth daily. 90 tablet 3   potassium chloride SA (KLOR-CON M) 20 MEQ tablet Take 2 tablets (40 mEq total) by mouth 2 (two) times daily. 180 tablet 3   rifaximin (XIFAXAN) 550 MG TABS tablet Take 1 tablet (550 mg total) by mouth 2 (two) times daily. 180 tablet 3   sertraline (ZOLOFT) 50 MG tablet Take 1 tablet (50 mg total) by mouth at bedtime. (Patient taking differently: Take 100 mg by mouth at bedtime.) 90 tablet 3   spironolactone (ALDACTONE) 100 MG tablet Take 1 tablet (100 mg total) by mouth daily. 30 tablet 1   torsemide (DEMADEX) 20 MG tablet Patient takes 3 tablets by  mouth daily and takes 1 extra tablet as needed.     traZODone (DESYREL) 50 MG tablet Take 1 tablet (50 mg total) by mouth at bedtime. 90 tablet 3   No current facility-administered medications for this visit.    Allergies as of 09/06/2022   (No Known Allergies)    Family History  Problem Relation Age of Onset   Liver cancer Mother    Heart disease Mother    Aneurysm Sister     Social History   Socioeconomic History   Marital status: Single    Spouse name: Not on file   Number of children: Not on file   Years of education: Not on file   Highest education level: Not on file  Occupational History   Not on file  Tobacco Use   Smoking status: Former    Packs/day: 1.00    Years: 20.00    Additional pack years: 0.00    Total pack years: 20.00    Types: Cigarettes    Quit date: 2017    Years since quitting: 7.4    Passive exposure: Current   Smokeless tobacco: Never  Vaping Use   Vaping Use: Never used  Substance and Sexual Activity   Alcohol use: No   Drug use: No   Sexual activity: Not on file  Other Topics Concern   Not on file  Social History Narrative   Not on file   Social Determinants  of Health   Financial Resource Strain: Not on file  Food Insecurity: No Food Insecurity (07/11/2022)   Hunger Vital Sign    Worried About Running Out of Food in the Last Year: Never true    Ran Out of Food in the Last Year: Never true  Transportation Needs: No Transportation Needs (07/11/2022)   PRAPARE - Administrator, Civil Service (Medical): No    Lack of Transportation (Non-Medical): No  Physical Activity: Not on file  Stress: Not on file  Social Connections: Not on file    Review of systems General: negative for malaise, night sweats, fever, chills, weight los Neck: Negative for lumps, goiter, pain and significant neck swelling Resp: Negative for cough, wheezing, dyspnea at rest CV: Negative for chest pain, leg swelling, palpitations, orthopnea GI: denies melena, hematochezia, nausea, vomiting, diarrhea, constipation, dysphagia, odyonophagia, early satiety or unintentional weight loss.  MSK: Negative for joint pain or swelling, back pain, and muscle pain. Derm: Negative for itching or rash Psych: Denies depression, anxiety, memory loss, confusion. No homicidal or suicidal ideation.  Heme: Negative for prolonged bleeding, bruising easily, and swollen nodes. Endocrine: Negative for cold or heat intolerance, polyuria, polydipsia and goiter. Neuro: negative for tremor, gait imbalance, syncope and seizures. The remainder of the review of systems is noncontributory.  Physical Exam: BP 125/78 (BP Location: Left Arm, Patient Position: Sitting, Cuff Size: Large)   Pulse 78   Temp (!) 95.9 F (35.5 C) (Temporal)   Ht 6\' 1"  (1.854 m)   Wt (!) 380 lb 12.8 oz (172.7 kg)   BMI 50.24 kg/m  General:   Alert and oriented. No distress noted. Pleasant and cooperative.  Head:  Normocephalic and atraumatic. Eyes:  Conjuctiva clear without scleral icterus. Mouth:  Oral mucosa pink and moist. Good dentition. No lesions. Heart: Normal rate and rhythm, s1 and s2 heart sounds present.   Lungs: Clear lung sounds in all lobes. Respirations equal and unlabored. Abdomen:  +BS, soft, non-tender and non-distended. No rebound or guarding. No HSM or masses  noted. Derm: No palmar erythema or jaundice Msk:  Symmetrical without gross deformities. Normal posture. Extremities:  Without edema. Neurologic:  Alert and  oriented x4 Psych:  Alert and cooperative. Normal mood and affect.  Invalid input(s): "6 MONTHS"   ASSESSMENT: Daniel Crawford is a 67 y.o. male presenting today    PLAN:  RUQ [place]  2. EGD/Colon-will need to see pulmonary/cards clearance (Dr. Vassie Loll)  3.  2g sodium diet  4. Continue with torsemide 60mg  +20mg  PRN 5. Continue spironolactone  6. Follow up with Cardiology sooner regarding LE edema   All questions were answered, patient verbalized understanding and is in agreement with plan as outlined above.    Follow Up: 6 weeks   Markas Aldredge L. Jeanmarie Hubert, MSN, APRN, AGNP-C Adult-Gerontology Nurse Practitioner [facility]

## 2022-09-10 NOTE — Addendum Note (Signed)
Addended by: Dolores Frame on: 09/10/2022 01:10 PM   Modules accepted: Level of Service

## 2022-09-15 ENCOUNTER — Encounter (HOSPITAL_COMMUNITY): Payer: Self-pay | Admitting: *Deleted

## 2022-09-15 ENCOUNTER — Emergency Department (HOSPITAL_COMMUNITY)

## 2022-09-15 ENCOUNTER — Emergency Department (HOSPITAL_COMMUNITY)
Admission: EM | Admit: 2022-09-15 | Discharge: 2022-09-15 | Disposition: A | Discharge status: Home / Self Care | Attending: Emergency Medicine | Admitting: Emergency Medicine

## 2022-09-15 ENCOUNTER — Other Ambulatory Visit: Payer: Self-pay

## 2022-09-15 DIAGNOSIS — R0602 Shortness of breath: Secondary | ICD-10-CM | POA: Insufficient documentation

## 2022-09-15 DIAGNOSIS — R5383 Other fatigue: Secondary | ICD-10-CM | POA: Insufficient documentation

## 2022-09-15 DIAGNOSIS — I509 Heart failure, unspecified: Secondary | ICD-10-CM | POA: Diagnosis not present

## 2022-09-15 DIAGNOSIS — R0600 Dyspnea, unspecified: Secondary | ICD-10-CM | POA: Diagnosis present

## 2022-09-15 LAB — COMPREHENSIVE METABOLIC PANEL
ALT: 26 U/L (ref 0–44)
AST: 50 U/L — ABNORMAL HIGH (ref 15–41)
Albumin: 2.8 g/dL — ABNORMAL LOW (ref 3.5–5.0)
Alkaline Phosphatase: 94 U/L (ref 38–126)
Anion gap: 7 (ref 5–15)
BUN: 13 mg/dL (ref 8–23)
CO2: 26 mmol/L (ref 22–32)
Calcium: 8.2 mg/dL — ABNORMAL LOW (ref 8.9–10.3)
Chloride: 100 mmol/L (ref 98–111)
Creatinine, Ser: 1.08 mg/dL (ref 0.61–1.24)
GFR, Estimated: >60 mL/min (ref >60)
Glucose, Bld: 148 mg/dL — ABNORMAL HIGH (ref 70–99)
Potassium: 3.7 mmol/L (ref 3.5–5.1)
Sodium: 133 mmol/L — ABNORMAL LOW (ref 135–145)
Total Bilirubin: 5.1 mg/dL — ABNORMAL HIGH (ref 0.3–1.2)
Total Protein: 6.5 g/dL (ref 6.5–8.1)

## 2022-09-15 LAB — TROPONIN I (HIGH SENSITIVITY)
Troponin I (High Sensitivity): 4 ng/L (ref <18)
Troponin I (High Sensitivity): 4 ng/L (ref <18)

## 2022-09-15 LAB — CBC WITH DIFFERENTIAL/PLATELET
Abs Immature Granulocytes: 0.01 10*3/uL (ref 0.00–0.07)
Basophils Absolute: 0.1 10*3/uL (ref 0.0–0.1)
Basophils Relative: 1 %
Eosinophils Absolute: 0.4 10*3/uL (ref 0.0–0.5)
Eosinophils Relative: 9 %
HCT: 33.6 % — ABNORMAL LOW (ref 39.0–52.0)
Hemoglobin: 10.8 g/dL — ABNORMAL LOW (ref 13.0–17.0)
Immature Granulocytes: 0 %
Lymphocytes Relative: 18 %
Lymphs Abs: 0.8 10*3/uL (ref 0.7–4.0)
MCH: 29.3 pg (ref 26.0–34.0)
MCHC: 32.1 g/dL (ref 30.0–36.0)
MCV: 91.1 fL (ref 80.0–100.0)
Monocytes Absolute: 0.4 10*3/uL (ref 0.1–1.0)
Monocytes Relative: 9 %
Neutro Abs: 2.6 10*3/uL (ref 1.7–7.7)
Neutrophils Relative %: 63 %
Platelets: 62 10*3/uL — ABNORMAL LOW (ref 150–400)
RBC: 3.69 MIL/uL — ABNORMAL LOW (ref 4.22–5.81)
RDW: 16.8 % — ABNORMAL HIGH (ref 11.5–15.5)
WBC: 4.2 10*3/uL (ref 4.0–10.5)
nRBC: 0 % (ref 0.0–0.2)

## 2022-09-15 LAB — LACTIC ACID, PLASMA
Lactic Acid, Venous: 2.4 mmol/L (ref 0.5–1.9)
Lactic Acid, Venous: 2.1 mmol/L (ref 0.5–1.9)

## 2022-09-15 LAB — MAGNESIUM: Magnesium: 2 mg/dL (ref 1.7–2.4)

## 2022-09-15 LAB — BRAIN NATRIURETIC PEPTIDE: B Natriuretic Peptide: 95 pg/mL (ref 0.0–100.0)

## 2022-09-15 MED ORDER — TORSEMIDE 20 MG PO TABS: 40.0000 mg | ORAL_TABLET | Freq: Two times a day (BID) | ORAL | 0 refills | Status: DC

## 2022-09-15 NOTE — ED Triage Notes (Signed)
Pt with SOB with exertion off and on, on home O2 yesterday-normally as PRN.  Pt with mid chest discomfort x 3 days.  Pt states he did not take his fluid pill yesterday due to generalized weakness.

## 2022-09-15 NOTE — Discharge Instructions (Addendum)
Please take your adjusted diuretic dosing for the next 5 days and discuss this with your physician.  Return here for concerning changes in your condition.

## 2022-09-15 NOTE — ED Provider Notes (Signed)
[place] EMERGENCY DEPARTMENT AT [facility] Provider Note   CSN: [id number] Arrival date & time: 09/15/22  1204     History  Chief Complaint  Patient presents with   Chest Pain    Daniel Crawford is a 67 y.o. male.  HPI Patient with multiple medical presents with dyspnea, fatigue.  He notes that he did not take his diuretics yesterday, but symptoms began several days prior.  With progression of symptoms, increasing swelling throughout, he presents for evaluation.  No discrete chest pain, no fever, no vomiting.     Home Medications Prior to Admission medications   Medication Sig Start Date End Date Taking? Authorizing Provider  acetaminophen (TYLENOL) 500 MG tablet Take 1,000 mg by mouth every 6 (six) hours as needed for moderate pain.    [provider]  albuterol (VENTOLIN HFA) 108 (90 Base) MCG/ACT inhaler Inhale 2 puffs into the lungs every 6 (six) hours as needed for wheezing or shortness of breath. 05/01/22   Oretha Milch, MD  carvedilol (COREG) 6.25 MG tablet Take 1 tablet (6.25 mg total) by mouth 2 (two) times daily with a meal. 01/25/22   Emokpae, Courage, MD  gabapentin (NEURONTIN) 100 MG capsule Take 100 mg by mouth daily as needed (pain). 02/20/21   [provider]  hydrOXYzine (VISTARIL) 50 MG capsule Take 1 capsule (50 mg total) by mouth 3 (three) times daily as needed for anxiety or nausea. 01/25/22   Shon Hale, MD  lactose free nutrition (BOOST) LIQD Take 237 mLs by mouth every evening.    [provider]  lactulose (CHRONULAC) 10 GM/15ML solution Take 30 mLs (20 g total) by mouth 2 (two) times daily. 07/13/22   Catarina Hartshorn, MD  levothyroxine (SYNTHROID) 175 MCG tablet Take 175 mcg by mouth daily before breakfast.    [provider]  ondansetron (ZOFRAN) 8 MG tablet Take by mouth every 8 (eight) hours as needed for nausea or vomiting.    [provider]  pantoprazole (PROTONIX) 40 MG tablet Take 1 tablet (40 mg  total) by mouth daily. 01/25/22   Shon Hale, MD  potassium chloride SA (KLOR-CON M) 20 MEQ tablet Take 2 tablets (40 mEq total) by mouth 2 (two) times daily. 07/25/22   Laurey Morale, MD  rifaximin (XIFAXAN) 550 MG TABS tablet Take 1 tablet (550 mg total) by mouth 2 (two) times daily. 01/25/22   Shon Hale, MD  sertraline (ZOLOFT) 50 MG tablet Take 1 tablet (50 mg total) by mouth at bedtime. Patient taking differently: Take 100 mg by mouth at bedtime. 01/25/22   Shon Hale, MD  spironolactone (ALDACTONE) 100 MG tablet Take 1 tablet (100 mg total) by mouth daily. 03/04/22   Johnson, Clanford L, MD  torsemide (DEMADEX) 20 MG tablet Take 2 tablets (40 mg total) by mouth 2 (two) times daily for 5 days. Patient takes 3 tablets by mouth daily and takes 1 extra tablet as needed. 09/15/22 09/20/22  Gerhard Munch, MD  traZODone (DESYREL) 50 MG tablet Take 1 tablet (50 mg total) by mouth at bedtime. 01/25/22   Shon Hale, MD      Allergies    Patient has no known allergies.    Review of Systems   Review of Systems  Physical Exam Updated Vital Signs BP (!) 142/64 (BP Location: Left Arm)   Pulse 73   Temp 98.6 F (37 C) (Oral)   Resp 17   Ht 6\' 1"  (1.854 m)   Wt (!) 175.5  kg   SpO2 96%   BMI 51.06 kg/m  Physical Exam Vitals and nursing note reviewed.  Constitutional:      General: He is not in acute distress.    Appearance: He is well-developed. He is obese. He is ill-appearing. He is not toxic-appearing or diaphoretic.  HENT:     Head: Normocephalic and atraumatic.  Eyes:     Conjunctiva/sclera: Conjunctivae normal.  Cardiovascular:     Rate and Rhythm: Normal rate and regular rhythm.  Pulmonary:     Effort: Tachypnea present. No respiratory distress.     Breath sounds: No stridor. Decreased breath sounds present.  Abdominal:     General: There is no distension.     Tenderness: There is abdominal tenderness. There is rebound. There is no guarding.   Musculoskeletal:     Right lower leg: Edema present.     Left lower leg: Edema present.  Skin:    General: Skin is warm and dry.  Neurological:     Mental Status: He is alert and oriented to person, place, and time.     ED Results / Procedures / Treatments   Labs (all labs ordered are listed, but only abnormal results are displayed) Labs Reviewed  COMPREHENSIVE METABOLIC PANEL - Abnormal; Notable for the following components:      Result Value   Sodium 133 (*)    Glucose, Bld 148 (*)    Calcium 8.2 (*)    Albumin 2.8 (*)    AST 50 (*)    Total Bilirubin 5.1 (*)    All other components within normal limits  LACTIC ACID, PLASMA - Abnormal; Notable for the following components:   Lactic Acid, Venous 2.4 (*)    All other components within normal limits  LACTIC ACID, PLASMA - Abnormal; Notable for the following components:   Lactic Acid, Venous 2.1 (*)    All other components within normal limits  CBC WITH DIFFERENTIAL/PLATELET - Abnormal; Notable for the following components:   RBC 3.69 (*)    Hemoglobin 10.8 (*)    HCT 33.6 (*)    RDW 16.8 (*)    Platelets 62 (*)    All other components within normal limits  BRAIN NATRIURETIC PEPTIDE  MAGNESIUM  TROPONIN I (HIGH SENSITIVITY)  TROPONIN I (HIGH SENSITIVITY)    EKG EKG Interpretation  Date/Time:  Saturday September 15 2022 12:17:01 EDT Ventricular Rate:  78 PR Interval:  205 QRS Duration: 100 QT Interval:  418 QTC Calculation: 477 R Axis:   35 Text Interpretation: Sinus rhythm Low voltage, precordial leads Borderline prolonged QT interval Abnormal ECG Confirmed by Gerhard Munch [phone]) on 09/15/2022 12:43:34 PM  Radiology DG Chest 2 View  Result Date: 09/15/2022 CLINICAL DATA:  Shortness of breath. EXAM: CHEST - 2 VIEW COMPARISON:  July 04, 2022. FINDINGS: The heart size and mediastinal contours are within normal limits. Both lungs are clear. The visualized skeletal structures are unremarkable. IMPRESSION: No active  cardiopulmonary disease. Electronically Signed   By: Lupita Raider M.D.   On: 09/15/2022 13:40    Procedures Procedures    Medications Ordered in ED Medications - No data to display  ED Course/ Medical Decision Making/ A&P                             Medical Decision Making Patient with morbid obesity, cirrhosis, heart failure presents with dyspnea, fatigue, on exam has gross anasarca, mild increased work of breathing.  Differential includes progression of disease, including heart failure, cirrhosis, pneumonia, bacteremia, sepsis. Cardiac 80 sinus normal Pulse ox 96% room air normal   Amount and/or Complexity of Data Reviewed External Data Reviewed: notes. Labs: ordered. Decision-making details documented in ED Course. Radiology: ordered and independent interpretation performed. Decision-making details documented in ED Course. ECG/medicine tests: ordered and independent interpretation performed. Decision-making details documented in ED Course.   3:33 PM Patient resting comfortably, no distress, no increased work of breathing.  Lactic acidosis has improved, troponin is unremarkable, BNP slightly elevation, typical for prior. No substantial electrolyte abnormalities, and he and I discussed all findings, his history, and after discussing his case with nursing care, it is clear the patient is in home hospice, has been enrolled palliative care previously.  Patient notes that he is no longer interested in receiving hospice services as he is unhappy with not being able to have testing performed.  With some suspicion for progression of disease with anasarca, diastolic heart failure, but no evidence for decompensated state, no fever, bacteremia, sepsis, patient will have increased Lasix dosing for the next 2 days, follow-up with his physician.  We discussed IV Lasix for prompt diuresis, patient has a preference to take increased doses at home rather than start here, so that he [place] diuresis in  his own house. No evidence for encephalopathy, lactulose dosing will remain the same.         Final Clinical Impression(s) / ED Diagnoses Final diagnoses:  SOB (shortness of breath)    Rx / DC Orders ED Discharge Orders          Ordered    torsemide (DEMADEX) 20 MG tablet  2 times daily        09/15/22 1533              Gerhard Munch, MD 09/15/22 1533

## 2022-09-17 ENCOUNTER — Telehealth (INDEPENDENT_AMBULATORY_CARE_PROVIDER_SITE_OTHER): Payer: Self-pay | Admitting: Gastroenterology

## 2022-09-17 NOTE — Telephone Encounter (Signed)
Pt left voicemail needing to know time of ultrasound on Friday 09/21/22, Returned call to patient and made him aware that [place] was Friday at 9:30am arrive at 9:15am. NPO after midnight. Pt verbalized understanding.

## 2022-09-21 ENCOUNTER — Ambulatory Visit (HOSPITAL_COMMUNITY)
Admission: RE | Admit: 2022-09-21 | Discharge: 2022-09-21 | Disposition: A | Discharge status: Home / Self Care | Payer: Medicare Other | Source: Ambulatory Visit | Attending: Gastroenterology | Admitting: Gastroenterology

## 2022-09-21 DIAGNOSIS — K7469 Other cirrhosis of liver: Secondary | ICD-10-CM | POA: Insufficient documentation

## 2022-09-21 DIAGNOSIS — K7460 Unspecified cirrhosis of liver: Secondary | ICD-10-CM | POA: Diagnosis not present

## 2022-09-21 DIAGNOSIS — Z9049 Acquired absence of other specified parts of digestive tract: Secondary | ICD-10-CM | POA: Diagnosis not present

## 2022-10-02 ENCOUNTER — Emergency Department (HOSPITAL_COMMUNITY): Payer: Medicare HMO

## 2022-10-02 ENCOUNTER — Telehealth (INDEPENDENT_AMBULATORY_CARE_PROVIDER_SITE_OTHER): Payer: Self-pay

## 2022-10-02 ENCOUNTER — Inpatient Hospital Stay (HOSPITAL_COMMUNITY)
Admission: EM | Admit: 2022-10-02 | Discharge: 2022-10-05 | DRG: 438 | Disposition: A | Discharge status: Home Health | Payer: Medicare HMO | Attending: Internal Medicine | Admitting: Internal Medicine

## 2022-10-02 ENCOUNTER — Encounter (HOSPITAL_COMMUNITY): Payer: Self-pay | Admitting: Emergency Medicine

## 2022-10-02 ENCOUNTER — Other Ambulatory Visit: Payer: Self-pay

## 2022-10-02 DIAGNOSIS — E1122 Type 2 diabetes mellitus with diabetic chronic kidney disease: Secondary | ICD-10-CM | POA: Diagnosis present

## 2022-10-02 DIAGNOSIS — R188 Other ascites: Secondary | ICD-10-CM

## 2022-10-02 DIAGNOSIS — K7682 Hepatic encephalopathy: Secondary | ICD-10-CM

## 2022-10-02 DIAGNOSIS — N1831 Chronic kidney disease, stage 3a: Secondary | ICD-10-CM | POA: Diagnosis not present

## 2022-10-02 DIAGNOSIS — K7689 Other specified diseases of liver: Secondary | ICD-10-CM | POA: Diagnosis not present

## 2022-10-02 DIAGNOSIS — I10 Essential (primary) hypertension: Secondary | ICD-10-CM

## 2022-10-02 DIAGNOSIS — F32A Depression, unspecified: Secondary | ICD-10-CM | POA: Diagnosis present

## 2022-10-02 DIAGNOSIS — J45909 Unspecified asthma, uncomplicated: Secondary | ICD-10-CM | POA: Diagnosis present

## 2022-10-02 DIAGNOSIS — E119 Type 2 diabetes mellitus without complications: Secondary | ICD-10-CM

## 2022-10-02 DIAGNOSIS — I5032 Chronic diastolic (congestive) heart failure: Secondary | ICD-10-CM

## 2022-10-02 DIAGNOSIS — K859 Acute pancreatitis without necrosis or infection, unspecified: Principal | ICD-10-CM

## 2022-10-02 DIAGNOSIS — Z87891 Personal history of nicotine dependence: Secondary | ICD-10-CM

## 2022-10-02 DIAGNOSIS — Z79899 Other long term (current) drug therapy: Secondary | ICD-10-CM

## 2022-10-02 DIAGNOSIS — G4733 Obstructive sleep apnea (adult) (pediatric): Secondary | ICD-10-CM | POA: Diagnosis present

## 2022-10-02 DIAGNOSIS — R600 Localized edema: Secondary | ICD-10-CM

## 2022-10-02 DIAGNOSIS — Z79890 Hormone replacement therapy (postmenopausal): Secondary | ICD-10-CM

## 2022-10-02 DIAGNOSIS — F419 Anxiety disorder, unspecified: Secondary | ICD-10-CM | POA: Diagnosis present

## 2022-10-02 DIAGNOSIS — R1032 Left lower quadrant pain: Secondary | ICD-10-CM | POA: Diagnosis not present

## 2022-10-02 DIAGNOSIS — Z6841 Body Mass Index (BMI) 40.0 and over, adult: Secondary | ICD-10-CM

## 2022-10-02 DIAGNOSIS — I130 Hypertensive heart and chronic kidney disease with heart failure and stage 1 through stage 4 chronic kidney disease, or unspecified chronic kidney disease: Secondary | ICD-10-CM | POA: Diagnosis present

## 2022-10-02 DIAGNOSIS — K7581 Nonalcoholic steatohepatitis (NASH): Secondary | ICD-10-CM | POA: Diagnosis present

## 2022-10-02 DIAGNOSIS — Z9049 Acquired absence of other specified parts of digestive tract: Secondary | ICD-10-CM

## 2022-10-02 DIAGNOSIS — E66813 Obesity, class 3: Secondary | ICD-10-CM | POA: Diagnosis present

## 2022-10-02 DIAGNOSIS — E039 Hypothyroidism, unspecified: Secondary | ICD-10-CM | POA: Diagnosis present

## 2022-10-02 DIAGNOSIS — J9601 Acute respiratory failure with hypoxia: Secondary | ICD-10-CM | POA: Diagnosis present

## 2022-10-02 DIAGNOSIS — K7460 Unspecified cirrhosis of liver: Secondary | ICD-10-CM

## 2022-10-02 DIAGNOSIS — I509 Heart failure, unspecified: Secondary | ICD-10-CM

## 2022-10-02 DIAGNOSIS — Z8249 Family history of ischemic heart disease and other diseases of the circulatory system: Secondary | ICD-10-CM

## 2022-10-02 DIAGNOSIS — D631 Anemia in chronic kidney disease: Secondary | ICD-10-CM | POA: Diagnosis present

## 2022-10-02 DIAGNOSIS — D696 Thrombocytopenia, unspecified: Secondary | ICD-10-CM | POA: Diagnosis present

## 2022-10-02 DIAGNOSIS — N183 Chronic kidney disease, stage 3 unspecified: Secondary | ICD-10-CM | POA: Diagnosis present

## 2022-10-02 DIAGNOSIS — I5033 Acute on chronic diastolic (congestive) heart failure: Secondary | ICD-10-CM | POA: Diagnosis present

## 2022-10-02 DIAGNOSIS — Z800 Family history of malignant neoplasm of digestive organs: Secondary | ICD-10-CM

## 2022-10-02 HISTORY — DX: Acute pancreatitis without necrosis or infection, unspecified: K85.90

## 2022-10-02 LAB — CBC
HCT: 33.6 % — ABNORMAL LOW (ref 39.0–52.0)
Hemoglobin: 10.9 g/dL — ABNORMAL LOW (ref 13.0–17.0)
MCH: 29.1 pg (ref 26.0–34.0)
MCHC: 32.4 g/dL (ref 30.0–36.0)
MCV: 89.8 fL (ref 80.0–100.0)
Platelets: 73 10*3/uL — ABNORMAL LOW (ref 150–400)
RBC: 3.74 MIL/uL — ABNORMAL LOW (ref 4.22–5.81)
RDW: 16.9 % — ABNORMAL HIGH (ref 11.5–15.5)
WBC: 5.3 10*3/uL (ref 4.0–10.5)
nRBC: 0 % (ref 0.0–0.2)

## 2022-10-02 LAB — URINALYSIS, ROUTINE W REFLEX MICROSCOPIC
Bilirubin Urine: NEGATIVE
Glucose, UA: NEGATIVE mg/dL
Ketones, ur: NEGATIVE mg/dL
Leukocytes,Ua: NEGATIVE
Nitrite: NEGATIVE
Protein, ur: NEGATIVE mg/dL
Specific Gravity, Urine: >1.046 — ABNORMAL HIGH (ref 1.005–1.030)
pH: 5 (ref 5.0–8.0)

## 2022-10-02 LAB — COMPREHENSIVE METABOLIC PANEL
ALT: 25 U/L (ref 0–44)
AST: 58 U/L — ABNORMAL HIGH (ref 15–41)
Albumin: 2.7 g/dL — ABNORMAL LOW (ref 3.5–5.0)
Alkaline Phosphatase: 93 U/L (ref 38–126)
Anion gap: 10 (ref 5–15)
BUN: 19 mg/dL (ref 8–23)
CO2: 27 mmol/L (ref 22–32)
Calcium: 8.6 mg/dL — ABNORMAL LOW (ref 8.9–10.3)
Chloride: 95 mmol/L — ABNORMAL LOW (ref 98–111)
Creatinine, Ser: 1.62 mg/dL — ABNORMAL HIGH (ref 0.61–1.24)
GFR, Estimated: 47 mL/min — ABNORMAL LOW (ref >60)
Glucose, Bld: 194 mg/dL — ABNORMAL HIGH (ref 70–99)
Potassium: 3.7 mmol/L (ref 3.5–5.1)
Sodium: 132 mmol/L — ABNORMAL LOW (ref 135–145)
Total Bilirubin: 4.6 mg/dL — ABNORMAL HIGH (ref 0.3–1.2)
Total Protein: 6.3 g/dL — ABNORMAL LOW (ref 6.5–8.1)

## 2022-10-02 LAB — LIPASE, BLOOD: Lipase: 52 U/L — ABNORMAL HIGH (ref 11–51)

## 2022-10-02 MED ORDER — LEVOTHYROXINE SODIUM 75 MCG PO TABS
175.0000 ug | ORAL_TABLET | Freq: Every day | ORAL | Status: DC
  Administered 2022-10-03 – 2022-10-05 (×3): 175 ug via ORAL
  Filled 2022-10-02 (×3): qty 1

## 2022-10-02 MED ORDER — FUROSEMIDE 10 MG/ML IJ SOLN
INTRAMUSCULAR | Status: AC
  Filled 2022-10-02: qty 4

## 2022-10-02 MED ORDER — CARVEDILOL 3.125 MG PO TABS
6.2500 mg | ORAL_TABLET | Freq: Two times a day (BID) | ORAL | Status: DC
  Administered 2022-10-02 – 2022-10-05 (×6): 6.25 mg via ORAL
  Filled 2022-10-02 (×6): qty 2

## 2022-10-02 MED ORDER — FUROSEMIDE 10 MG/ML IJ SOLN
40.0000 mg | INTRAMUSCULAR | Status: AC
  Administered 2022-10-02: 40 mg via INTRAVENOUS
  Filled 2022-10-02: qty 4

## 2022-10-02 MED ORDER — RIFAXIMIN 550 MG PO TABS
550.0000 mg | ORAL_TABLET | Freq: Two times a day (BID) | ORAL | Status: DC
  Administered 2022-10-02 – 2022-10-05 (×6): 550 mg via ORAL
  Filled 2022-10-02 (×6): qty 1

## 2022-10-02 MED ORDER — ONDANSETRON HCL 4 MG PO TABS: 4.0000 mg | ORAL_TABLET | Freq: Three times a day (TID) | ORAL | Status: DC | PRN

## 2022-10-02 MED ORDER — ONDANSETRON HCL 4 MG/2ML IJ SOLN
4.0000 mg | Freq: Three times a day (TID) | INTRAMUSCULAR | Status: DC | PRN
  Administered 2022-10-03: 4 mg via INTRAVENOUS
  Filled 2022-10-02: qty 2

## 2022-10-02 MED ORDER — HYDROMORPHONE HCL 1 MG/ML IJ SOLN
1.0000 mg | Freq: Once | INTRAMUSCULAR | Status: AC
  Administered 2022-10-02: 1 mg via INTRAVENOUS
  Filled 2022-10-02: qty 1

## 2022-10-02 MED ORDER — POLYETHYLENE GLYCOL 3350 17 G PO PACK: 17.0000 g | PACK | Freq: Every day | ORAL | Status: DC | PRN

## 2022-10-02 MED ORDER — POTASSIUM CHLORIDE CRYS ER 20 MEQ PO TBCR
40.0000 meq | EXTENDED_RELEASE_TABLET | Freq: Once | ORAL | Status: AC
  Administered 2022-10-02: 40 meq via ORAL
  Filled 2022-10-02: qty 2

## 2022-10-02 MED ORDER — MORPHINE SULFATE (PF) 4 MG/ML IV SOLN: 4.0000 mg | Freq: Once | INTRAVENOUS | Status: DC

## 2022-10-02 MED ORDER — IOHEXOL 300 MG/ML  SOLN
100.0000 mL | Freq: Once | INTRAMUSCULAR | Status: AC | PRN
  Administered 2022-10-02: 100 mL via INTRAVENOUS

## 2022-10-02 MED ORDER — PANTOPRAZOLE SODIUM 40 MG IV SOLR
40.0000 mg | Freq: Every day | INTRAVENOUS | Status: DC
  Administered 2022-10-02 – 2022-10-05 (×4): 40 mg via INTRAVENOUS
  Filled 2022-10-02 (×4): qty 10

## 2022-10-02 MED ORDER — PNEUMOCOCCAL 20-VAL CONJ VACC 0.5 ML IM SUSY: 0.5000 mL | PREFILLED_SYRINGE | INTRAMUSCULAR | Status: DC

## 2022-10-02 MED ORDER — LACTULOSE 10 GM/15ML PO SOLN: 20.0000 g | Freq: Every day | ORAL | Status: DC | PRN

## 2022-10-02 MED ORDER — HYDROMORPHONE HCL 1 MG/ML IJ SOLN: 1.0000 mg | INTRAMUSCULAR | Status: DC | PRN

## 2022-10-02 NOTE — Assessment & Plan Note (Addendum)
Appears stable and compensated.  Also with liver cirrhosis.  Has chronic bilateral lower extremity edema that is unchanged from baseline per patient.  Recent echo 06/2022 EF 55%, normal LV diastolic parameters. -Reports compliance with torsemide, hold for now with pancreatitis

## 2022-10-02 NOTE — Assessment & Plan Note (Addendum)
Stable. NASH Liver cirrhosis- complicated by history of hepatic encephalopathy and thrombocytopenia.  Mental status currently at baseline.  Platelets 73.  At baseline has about 3 Bowel mvts daily without lactulose -Resume rifaximin - He is on as needed lactulose, will resume as needed

## 2022-10-02 NOTE — Assessment & Plan Note (Signed)
Creatinine stable at 1.6  

## 2022-10-02 NOTE — Telephone Encounter (Signed)
Patient called today says he is having severe lower left pain that radiates towards the back. Patient could hardly speak as he was in so much pain. I advised that he needs to report to the nearest Ed.

## 2022-10-02 NOTE — Assessment & Plan Note (Addendum)
Abdominal pain nausea without vomiting.  Lipase 52.  CT Abd/pelvis W contrast- Nonspecific retroperitoneal inflammatory/edematous changes predominately posterior and inferior to the pancreas.  Patient denies alcohol intake.  History of Nash liver cirrhosis. -Bowel rest, n.p.o, allow oral fluid intake -Patient appears chronically edematous, holding off on diuretics or IV fluids for now, monitoring conservatively -Continue IV Dilaudid 1 mg every 4 hourly as needed - IV protonix 40mg  daily

## 2022-10-02 NOTE — H&P (Signed)
History and Physical    Daniel Crawford ZOX:[id number] DOB: 10/10/1955 DOA: 10/02/2022  PCP: [facility]   Patient coming from: Home  I have personally briefly reviewed patient's old medical records in [facility]  Chief Complaint: Abdominal Pain  HPI: Daniel Crawford is a 67 y.o. male with medical history significant for diastolic CHF, NASH liver cirrhosis, hypertension, OSA, diabetes mellitus. Patient presented to the ED with complaints of abdominal pain that started today, ports pain radiates to his back.  Reports nausea without vomiting.  He has chronic difficulty breathing that is unchanged.  His lower extremity swelling is unchanged also.  He is compliant with his medications which include torsemide, and rifaximin.   He does not drink alcohol.  ED Course: Tmax 99.5.  Heart rate 95-101.  Respiratory rate 19-22.  Blood pressure systolic 110-139.  O2 sats greater than 94% on room air. Lipase 52. CT abd/pelvis W contrast- Nonspecific retroperitoneal inflammatory/edematous changes predominately posterior and inferior to the pancreas. 40 mg Lasix given, due to lower extremity edema.  Hospitalist to admit.  Review of Systems: As per HPI all other systems reviewed and negative.  Past Medical History:  Diagnosis Date   Anemia    Anxiety    Asthma    CHF (congestive heart failure) (HCC)    CKD (chronic kidney disease)    Depression    Diabetes mellitus without complication (HCC)    Dyspnea    Hypertension    Hypothyroidism    Liver cirrhosis secondary to NASH (HCC)    NASH (nonalcoholic steatohepatitis)    Pre-diabetes    Spleen enlarged    Thrombocytopenia (HCC)     Past Surgical History:  Procedure Laterality Date   Bilateral hernia surgery     2006, 2007   CHOLECYSTECTOMY     2016    COLONOSCOPY WITH PROPOFOL N/A 06/28/2021   Procedure: COLONOSCOPY WITH PROPOFOL;  Surgeon: Malissa Hippo, MD;  Location: [facility];  Service: Endoscopy;  Laterality:  N/A;  1020   ESOPHAGEAL DILATION N/A 11/06/2019   Procedure: ESOPHAGEAL DILATION;  Surgeon: Dolores Frame, MD;  Location: [facility];  Service: Gastroenterology;  Laterality: N/A;   ESOPHAGOGASTRODUODENOSCOPY (EGD) WITH PROPOFOL N/A 11/06/2019   Procedure: ESOPHAGOGASTRODUODENOSCOPY (EGD) WITH PROPOFOL;  Surgeon: Dolores Frame, MD;  Location: [facility];  Service: Gastroenterology;  Laterality: N/A;  1045   IR RADIOLOGIST EVAL & MGMT  02/19/2022   LAPAROSCOPIC ASSISTED VENTRAL HERNIA REPAIR     Polyp removed     in January of 2018.    POLYPECTOMY  06/28/2021   Procedure: POLYPECTOMY INTESTINAL;  Surgeon: Malissa Hippo, MD;  Location: [facility];  Service: Endoscopy;;   RIGHT HEART CATH N/A 01/08/2022   Procedure: RIGHT HEART CATH;  Surgeon: Corky Crafts, MD;  Location: [facility] INVASIVE CV LAB;  Service: Cardiovascular;  Laterality: N/A;     reports that he quit smoking about 7 years ago. His smoking use included cigarettes. He has a 20.00 pack-year smoking history. He has been exposed to tobacco smoke. He has never used smokeless tobacco. He reports that he does not drink alcohol and does not use drugs.  No Known Allergies  Family History  Problem Relation Age of Onset   Liver cancer Mother    Heart disease Mother    Aneurysm Sister     Prior to Admission medications   Medication Sig Start Date End Date Taking? Authorizing Provider  acetaminophen (TYLENOL) 500 MG tablet  Take 1,000 mg by mouth every 6 (six) hours as needed for moderate pain.    [provider]  albuterol (VENTOLIN HFA) 108 (90 Base) MCG/ACT inhaler Inhale 2 puffs into the lungs every 6 (six) hours as needed for wheezing or shortness of breath. 05/01/22   Oretha Milch, MD  carvedilol (COREG) 6.25 MG tablet Take 1 tablet (6.25 mg total) by mouth 2 (two) times daily with a meal. 01/25/22   Merrianne Mccumbers, Courage, MD  gabapentin (NEURONTIN) 100 MG capsule Take 100 mg by mouth daily as  needed (pain). 02/20/21   [provider]  hydrOXYzine (VISTARIL) 50 MG capsule Take 1 capsule (50 mg total) by mouth 3 (three) times daily as needed for anxiety or nausea. 01/25/22   Shon Hale, MD  lactose free nutrition (BOOST) LIQD Take 237 mLs by mouth every evening.    [provider]  lactulose (CHRONULAC) 10 GM/15ML solution Take 30 mLs (20 g total) by mouth 2 (two) times daily. 07/13/22   Catarina Hartshorn, MD  levothyroxine (SYNTHROID) 175 MCG tablet Take 175 mcg by mouth daily before breakfast.    [provider]  ondansetron (ZOFRAN) 8 MG tablet Take by mouth every 8 (eight) hours as needed for nausea or vomiting.    [provider]  pantoprazole (PROTONIX) 40 MG tablet Take 1 tablet (40 mg total) by mouth daily. 01/25/22   Shon Hale, MD  potassium chloride SA (KLOR-CON M) 20 MEQ tablet Take 2 tablets (40 mEq total) by mouth 2 (two) times daily. 07/25/22   Laurey Morale, MD  rifaximin (XIFAXAN) 550 MG TABS tablet Take 1 tablet (550 mg total) by mouth 2 (two) times daily. 01/25/22   Shon Hale, MD  sertraline (ZOLOFT) 50 MG tablet Take 1 tablet (50 mg total) by mouth at bedtime. Patient taking differently: Take 100 mg by mouth at bedtime. 01/25/22   Shon Hale, MD  spironolactone (ALDACTONE) 100 MG tablet Take 1 tablet (100 mg total) by mouth daily. 03/04/22   Johnson, Clanford L, MD  torsemide (DEMADEX) 20 MG tablet Take 2 tablets (40 mg total) by mouth 2 (two) times daily for 5 days. Patient takes 3 tablets by mouth daily and takes 1 extra tablet as needed. 09/15/22 09/20/22  Gerhard Munch, MD  traZODone (DESYREL) 50 MG tablet Take 1 tablet (50 mg total) by mouth at bedtime. 01/25/22   Shon Hale, MD    Physical Exam: Vitals:   10/02/22 1412 10/02/22 1700 10/02/22 1720 10/02/22 1721  BP: 118/60 118/77 (!) 110/51   Pulse: 99 95 96   Resp: 20  (!) 22   Temp: 99.4 F (37.4 C)   99.5 F (37.5 C)  TempSrc: Oral   Oral  SpO2:  95% 97% 96%   Weight:      Height:        Constitutional: NAD, calm, comfortable Vitals:   10/02/22 1412 10/02/22 1700 10/02/22 1720 10/02/22 1721  BP: 118/60 118/77 (!) 110/51   Pulse: 99 95 96   Resp: 20  (!) 22   Temp: 99.4 F (37.4 C)   99.5 F (37.5 C)  TempSrc: Oral   Oral  SpO2: 95% 97% 96%   Weight:      Height:       Eyes: PERRL, lids and conjunctivae normal ENMT: Mucous membranes are moist.  Neck: normal, supple, no masses, no thyromegaly Respiratory: clear to auscultation bilaterally, no wheezing, no crackles. Normal respiratory effort. No accessory muscle use.  Cardiovascular: Regular rate  and rhythm, no murmurs / rubs / gallops.  Unchanged 1+ pitting bilateral lower extremity edema to knees. 2+ pedal pulses. No carotid bruits.  Abdomen: Diffusely tender, obese, no masses palpated. No hepatosplenomegaly.  Musculoskeletal: no clubbing / cyanosis. No joint deformity upper and lower extremities.  Skin: no rashes, lesions, ulcers. No induration Neurologic: No facial asymmetry, speech fluent, moving extremities.  Psychiatric: Normal judgment and insight. Alert and oriented x 3. Normal mood.   Labs on Admission: I have personally reviewed following labs and imaging studies  CBC: Recent Labs  Lab 10/02/22 1433  WBC 5.3  HGB 10.9*  HCT 33.6*  MCV 89.8  PLT 73*   Basic Metabolic Panel: Recent Labs  Lab 10/02/22 1433  NA 132*  K 3.7  CL 95*  CO2 27  GLUCOSE 194*  BUN 19  CREATININE 1.62*  CALCIUM 8.6*   GFR: Estimated Creatinine Clearance: 77 mL/min (A) (by C-G formula based on SCr of 1.62 mg/dL (H)). Liver Function Tests: Recent Labs  Lab 10/02/22 1433  AST 58*  ALT 25  ALKPHOS 93  BILITOT 4.6*  PROT 6.3*  ALBUMIN 2.7*   Recent Labs  Lab 10/02/22 1433  LIPASE 52*   Urine analysis:    Component Value Date/Time   COLORURINE AMBER (A) 07/10/2022 2103   APPEARANCEUR CLEAR 07/10/2022 2103   LABSPEC 1.021 07/10/2022 2103   PHURINE 7.0  07/10/2022 2103   GLUCOSEU NEGATIVE 07/10/2022 2103   HGBUR SMALL (A) 07/10/2022 2103   BILIRUBINUR SMALL (A) 07/10/2022 2103   KETONESUR NEGATIVE 07/10/2022 2103   PROTEINUR 30 (A) 07/10/2022 2103   NITRITE NEGATIVE 07/10/2022 2103   LEUKOCYTESUR NEGATIVE 07/10/2022 2103    Radiological Exams on Admission: CT ABDOMEN PELVIS W CONTRAST  Result Date: 10/02/2022 CLINICAL DATA:  Left lower abdominal pain, nausea EXAM: CT ABDOMEN AND PELVIS WITH CONTRAST TECHNIQUE: Multidetector CT imaging of the abdomen and pelvis was performed using the standard protocol following bolus administration of intravenous contrast. RADIATION DOSE REDUCTION: This exam was performed according to the departmental dose-optimization program which includes automated exposure control, adjustment of the mA and/or kV according to patient size and/or use of iterative reconstruction technique. CONTRAST:  OMNIPAQUE IOHEXOL 300 MG/ML  SOLN COMPARISON:  01/22/2022 FINDINGS: Lower chest: Small right pleural effusion, new since previous. No pneumothorax. Visualized lung bases clear. Hepatobiliary: Nodular hepatic contour suggesting cirrhosis. No focal liver lesion or biliary ductal dilatation. Cholecystectomy clips. Portal vein patent. Pancreas: No mass or ductal dilatation. Increase in retroperitoneal inflammatory/edematous changes predominately posterior and inferior to the pancreas. No pseudocyst. Spleen: Splenomegaly 21.4 cm craniocaudal length. No focal lesion. Relatively small splenorenal venous shunt. Adrenals/Urinary Tract: No adrenal mass. Symmetric renal parenchymal enhancement. 4 mm right lower pole renal calculus. No hydronephrosis. Urinary bladder is nondistended. Stomach/Bowel: Core small esophageal varices. The stomach is nondistended, without acute finding. Small bowel nondilated. Normal appendix. Colon incompletely distended, with suggestion of circumferential wall thickening in multiple segments, focal lesion evident.  Vascular/Lymphatic: No AAA. Portal vein patent. Small esophageal varices. IVC patent. Reproductive: Prostate is unremarkable. Other: Small volume predominantly perihepatic ascites.  No free air. Musculoskeletal: Body wall anasarca most marked anteriorly. Moderate paraumbilical hernia containing only mesenteric fat and vessels, no bowel. Sutures of laparoscopic left inguinal hernia repair. Mild multilevel spondylitic changes in the lumbar spine. IMPRESSION: 1. Nonspecific retroperitoneal inflammatory/edematous changes predominately posterior and inferior to the pancreas. Correlate with amylase and lipase to exclude pancreatitis. 2. Cirrhosis with splenomegaly, small volume ascites, and small esophageal varices. 3. Small  right pleural effusion, new since previous. 4. Nonobstructive right nephrolithiasis. 5. Moderate paraumbilical hernia containing mesenteric fat and vessels, no bowel. Electronically Signed   By: Corlis Leak M.D.   On: 10/02/2022 16:51    EKG: None  Assessment/Plan Principal Problem:   Acute pancreatitis Active Problems:   Cirrhosis of liver with ascites (HCC)   Chronic diastolic heart failure (HCC)   Essential hypertension   Morbid obesity (HCC)   T2DM (type 2 diabetes mellitus) (HCC)   CKD stage 3a, GFR 45-59 ml/min (HCC)   OSA (obstructive sleep apnea)  Assessment and Plan: * Acute pancreatitis Abdominal pain nausea without vomiting.  Lipase 52.  CT Abd/pelvis W contrast- Nonspecific retroperitoneal inflammatory/edematous changes predominately posterior and inferior to the pancreas.  Patient denies alcohol intake.  History of Nash liver cirrhosis. -Bowel rest, n.p.o, allow oral fluid intake -Patient appears chronically edematous, holding off on diuretics or IV fluids for now, monitoring conservatively -Continue IV Dilaudid 1 mg every 4 hourly as needed - IV protonix 40mg  daily  Chronic diastolic heart failure (HCC) Appears stable and compensated.  Also with liver cirrhosis.   Has chronic bilateral lower extremity edema that is unchanged from baseline per patient.  Recent echo 06/2022 EF 55%, normal LV diastolic parameters. -Reports compliance with torsemide, hold for now with pancreatitis  Cirrhosis of liver with ascites (HCC) Stable. NASH Liver cirrhosis- complicated by history of hepatic encephalopathy and thrombocytopenia.  Mental status currently at baseline.  Platelets 73.  At baseline has about 3 Bowel mvts daily without lactulose -Resume rifaximin - He is on as needed lactulose, will resume as needed   CKD stage 3a, GFR 45-59 ml/min (HCC) Creatinine stable at 1.6.  T2DM (type 2 diabetes mellitus) (HCC) - Diet controlled -Daily fasting CBG - HgbA1c   Essential hypertension Stable. -Resume carvedilol -Hold torsemide and spironolactone for now   DVT prophylaxis: SCDS Code Status:, FULL Code- Confirmed with patient at bedside Family Communication: None At bedside Disposition Plan: ~ 2 days Consults called:  None Admission status:  Obs tele     Author: Onnie Boer, MD 10/02/2022 8:58 PM  For on call review [email or web address].

## 2022-10-02 NOTE — Assessment & Plan Note (Addendum)
Stable. -Resume carvedilol -Hold torsemide and spironolactone for now

## 2022-10-02 NOTE — ED Provider Notes (Signed)
[place] EMERGENCY DEPARTMENT AT [facility] Provider Note   CSN: [id number] Arrival date & time: 10/02/22  1345     History  Chief Complaint  Patient presents with   Abdominal Pain    Daniel Crawford is a 67 y.o. male.   Abdominal Pain  This patient is a 67 year old male known to the emergency department, history of some degree of liver failure on lactulose, also has a history of chronic severe edema for which she has not infrequently admitted for diuresis.  Presents with abdominal pain starting within the last 12 hours, constant, no diarrhea, has had some nausea but no vomiting, was told by GI to come in for further evaluation.  Denies any prior abdominal surgery.    Home Medications Prior to Admission medications   Medication Sig Start Date End Date Taking? Authorizing Provider  acetaminophen (TYLENOL) 500 MG tablet Take 1,000 mg by mouth every 6 (six) hours as needed for moderate pain.    [provider]  albuterol (VENTOLIN HFA) 108 (90 Base) MCG/ACT inhaler Inhale 2 puffs into the lungs every 6 (six) hours as needed for wheezing or shortness of breath. 05/01/22   Oretha Milch, MD  carvedilol (COREG) 6.25 MG tablet Take 1 tablet (6.25 mg total) by mouth 2 (two) times daily with a meal. 01/25/22   Emokpae, Courage, MD  gabapentin (NEURONTIN) 100 MG capsule Take 100 mg by mouth daily as needed (pain). 02/20/21   [provider]  hydrOXYzine (VISTARIL) 50 MG capsule Take 1 capsule (50 mg total) by mouth 3 (three) times daily as needed for anxiety or nausea. 01/25/22   Shon Hale, MD  lactose free nutrition (BOOST) LIQD Take 237 mLs by mouth every evening.    [provider]  lactulose (CHRONULAC) 10 GM/15ML solution Take 30 mLs (20 g total) by mouth 2 (two) times daily. 07/13/22   Catarina Hartshorn, MD  levothyroxine (SYNTHROID) 175 MCG tablet Take 175 mcg by mouth daily before breakfast.    [provider]  ondansetron (ZOFRAN) 8 MG tablet Take  by mouth every 8 (eight) hours as needed for nausea or vomiting.    [provider]  pantoprazole (PROTONIX) 40 MG tablet Take 1 tablet (40 mg total) by mouth daily. 01/25/22   Shon Hale, MD  potassium chloride SA (KLOR-CON M) 20 MEQ tablet Take 2 tablets (40 mEq total) by mouth 2 (two) times daily. 07/25/22   Laurey Morale, MD  rifaximin (XIFAXAN) 550 MG TABS tablet Take 1 tablet (550 mg total) by mouth 2 (two) times daily. 01/25/22   Shon Hale, MD  sertraline (ZOLOFT) 50 MG tablet Take 1 tablet (50 mg total) by mouth at bedtime. Patient taking differently: Take 100 mg by mouth at bedtime. 01/25/22   Shon Hale, MD  spironolactone (ALDACTONE) 100 MG tablet Take 1 tablet (100 mg total) by mouth daily. 03/04/22   Johnson, Clanford L, MD  torsemide (DEMADEX) 20 MG tablet Take 2 tablets (40 mg total) by mouth 2 (two) times daily for 5 days. Patient takes 3 tablets by mouth daily and takes 1 extra tablet as needed. 09/15/22 09/20/22  Gerhard Munch, MD  traZODone (DESYREL) 50 MG tablet Take 1 tablet (50 mg total) by mouth at bedtime. 01/25/22   Shon Hale, MD      Allergies    Patient has no known allergies.    Review of Systems   Review of Systems  Gastrointestinal:  Positive for abdominal pain.  All other systems  reviewed and are negative.   Physical Exam Updated Vital Signs BP (!) 110/51   Pulse 96   Temp 99.5 F (37.5 C) (Oral)   Resp (!) 22   Ht 1.854 m (6\' 1" )   Wt (!) 175.5 kg   SpO2 96%   BMI 51.06 kg/m  Physical Exam Vitals and nursing note reviewed.  Constitutional:      General: He is not in acute distress.    Appearance: He is well-developed.  HENT:     Head: Normocephalic and atraumatic.     Mouth/Throat:     Pharynx: No oropharyngeal exudate.  Eyes:     General: No scleral icterus.       Right eye: No discharge.        Left eye: No discharge.     Conjunctiva/sclera: Conjunctivae normal.     Pupils: Pupils are equal, round, and  reactive to light.  Neck:     Thyroid: No thyromegaly.     Vascular: No JVD.  Cardiovascular:     Rate and Rhythm: Regular rhythm. Tachycardia present.     Heart sounds: Normal heart sounds. No murmur heard.    No friction rub. No gallop.  Pulmonary:     Effort: Pulmonary effort is normal. No respiratory distress.     Breath sounds: Normal breath sounds. No wheezing or rales.  Abdominal:     General: Bowel sounds are normal. There is distension.     Palpations: Abdomen is soft. There is no mass.     Tenderness: There is abdominal tenderness.     Comments: Mild to moderate diffuse abdominal tenderness without guarding or peritoneal signs, anasarca present on the abdominal wall  Musculoskeletal:        General: No tenderness. Normal range of motion.     Cervical back: Normal range of motion and neck supple.     Right lower leg: Edema present.     Left lower leg: Edema present.     Comments: Severe pitting edema of the legs all the way through the thighs consistent with patient's prior presentations  Lymphadenopathy:     Cervical: No cervical adenopathy.  Skin:    General: Skin is warm and dry.     Findings: No erythema or rash.  Neurological:     Mental Status: He is alert.     Coordination: Coordination normal.  Psychiatric:        Behavior: Behavior normal.     ED Results / Procedures / Treatments   Labs (all labs ordered are listed, but only abnormal results are displayed) Labs Reviewed  LIPASE, BLOOD - Abnormal; Notable for the following components:      Result Value   Lipase 52 (*)    All other components within normal limits  COMPREHENSIVE METABOLIC PANEL - Abnormal; Notable for the following components:   Sodium 132 (*)    Chloride 95 (*)    Glucose, Bld 194 (*)    Creatinine, Ser 1.62 (*)    Calcium 8.6 (*)    Total Protein 6.3 (*)    Albumin 2.7 (*)    AST 58 (*)    Total Bilirubin 4.6 (*)    GFR, Estimated 47 (*)    All other components within normal  limits  CBC - Abnormal; Notable for the following components:   RBC 3.74 (*)    Hemoglobin 10.9 (*)    HCT 33.6 (*)    RDW 16.9 (*)    Platelets 73 (*)  All other components within normal limits  URINALYSIS, ROUTINE W REFLEX MICROSCOPIC    EKG None  Radiology CT ABDOMEN PELVIS W CONTRAST  Result Date: 10/02/2022 CLINICAL DATA:  Left lower abdominal pain, nausea EXAM: CT ABDOMEN AND PELVIS WITH CONTRAST TECHNIQUE: Multidetector CT imaging of the abdomen and pelvis was performed using the standard protocol following bolus administration of intravenous contrast. RADIATION DOSE REDUCTION: This exam was performed according to the departmental dose-optimization program which includes automated exposure control, adjustment of the mA and/or kV according to patient size and/or use of iterative reconstruction technique. CONTRAST:  OMNIPAQUE IOHEXOL 300 MG/ML  SOLN COMPARISON:  01/22/2022 FINDINGS: Lower chest: Small right pleural effusion, new since previous. No pneumothorax. Visualized lung bases clear. Hepatobiliary: Nodular hepatic contour suggesting cirrhosis. No focal liver lesion or biliary ductal dilatation. Cholecystectomy clips. Portal vein patent. Pancreas: No mass or ductal dilatation. Increase in retroperitoneal inflammatory/edematous changes predominately posterior and inferior to the pancreas. No pseudocyst. Spleen: Splenomegaly 21.4 cm craniocaudal length. No focal lesion. Relatively small splenorenal venous shunt. Adrenals/Urinary Tract: No adrenal mass. Symmetric renal parenchymal enhancement. 4 mm right lower pole renal calculus. No hydronephrosis. Urinary bladder is nondistended. Stomach/Bowel: Core small esophageal varices. The stomach is nondistended, without acute finding. Small bowel nondilated. Normal appendix. Colon incompletely distended, with suggestion of circumferential wall thickening in multiple segments, focal lesion evident. Vascular/Lymphatic: No AAA. Portal vein  patent. Small esophageal varices. IVC patent. Reproductive: Prostate is unremarkable. Other: Small volume predominantly perihepatic ascites.  No free air. Musculoskeletal: Body wall anasarca most marked anteriorly. Moderate paraumbilical hernia containing only mesenteric fat and vessels, no bowel. Sutures of laparoscopic left inguinal hernia repair. Mild multilevel spondylitic changes in the lumbar spine. IMPRESSION: 1. Nonspecific retroperitoneal inflammatory/edematous changes predominately posterior and inferior to the pancreas. Correlate with amylase and lipase to exclude pancreatitis. 2. Cirrhosis with splenomegaly, small volume ascites, and small esophageal varices. 3. Small right pleural effusion, new since previous. 4. Nonobstructive right nephrolithiasis. 5. Moderate paraumbilical hernia containing mesenteric fat and vessels, no bowel. Electronically Signed   By: Corlis Leak M.D.   On: 10/02/2022 16:51    Procedures Procedures    Medications Ordered in ED Medications  morphine (PF) 4 MG/ML injection 4 mg (4 mg Intravenous Not Given 10/02/22 1717)  furosemide (LASIX) 10 MG/ML injection (has no administration in time range)  iohexol (OMNIPAQUE) 300 MG/ML solution 100 mL (100 mLs Intravenous Contrast Given 10/02/22 1627)  HYDROmorphone (DILAUDID) injection 1 mg (1 mg Intravenous Given 10/02/22 1721)  furosemide (LASIX) injection 40 mg (40 mg Intravenous Given 10/02/22 1723)    ED Course/ Medical Decision Making/ A&P                             Medical Decision Making Amount and/or Complexity of Data Reviewed Labs: ordered. Radiology: ordered.  Risk Prescription drug management. Decision regarding hospitalization.    This patient presents to the ED for concern of abdominal pain, this involves an extensive number of treatment options, and is a complaint that carries with it a high risk of complications and morbidity.  The differential diagnosis includes pathology including appendicitis  gallbladder perforated viscus, bowel obstruction, could be liver related given likely some degree of liver dysfunction   Co morbidities that complicate the patient evaluation  As above severe chronic anasarca   Additional history obtained:  Additional history obtained from medical record External records from outside source obtained and reviewed including gastroenterology notes, admitted for  hepatic encephalopathy in April, history of chronic diastolic heart failure, history of cirrhosis   Lab Tests:  I Ordered, and personally interpreted labs.  The pertinent results include: Paced trending up, no leukocytosis   Imaging Studies ordered:  I ordered imaging studies including CT abdomen for the severe abdominal pain I independently visualized and interpreted imaging which showed peripancreatic inflammation I agree with the radiologist interpretation   Cardiac Monitoring: / EKG:  The patient was maintained on a cardiac monitor.  I personally viewed and interpreted the cardiac monitored which showed an underlying rhythm of: Normal sinus rhythm, borderline tachycardia   Consultations Obtained:  I requested consultation with the hospitalist Dr. Mariea Clonts,  and discussed lab and imaging findings as well as pertinent plan - they recommend: Admission to the hospital   Problem List / ED Course / Critical interventions / Medication management  Patient was given furosemide and hydromorphone for pain and diuresis, some improvement  I have reviewed the patients home medicines and have made adjustments as needed   Social Determinants of Health:  Liver failure   Test / Admission - Considered:  Admit to hospital         Final Clinical Impression(s) / ED Diagnoses Final diagnoses:  Acute pancreatitis, unspecified complication status, unspecified pancreatitis type  Peripheral edema     Eber Hong, MD 10/02/22 1746

## 2022-10-02 NOTE — Telephone Encounter (Signed)
Agree, thanks

## 2022-10-02 NOTE — ED Notes (Signed)
IV access established. Labs drawn and sent.

## 2022-10-02 NOTE — ED Triage Notes (Signed)
Pt via POV c/o left lower abdominal pain with headache since early this morning. Pt reports feeling nauseated with some clear regurgitation but no vomiting. Denies diarrhea and constipation. Pt called his GI doctor and was advised to come to ER for eval of symptoms.

## 2022-10-02 NOTE — Assessment & Plan Note (Signed)
-   Diet controlled -Daily fasting CBG - HgbA1c

## 2022-10-03 DIAGNOSIS — I130 Hypertensive heart and chronic kidney disease with heart failure and stage 1 through stage 4 chronic kidney disease, or unspecified chronic kidney disease: Secondary | ICD-10-CM | POA: Diagnosis not present

## 2022-10-03 DIAGNOSIS — Z9049 Acquired absence of other specified parts of digestive tract: Secondary | ICD-10-CM | POA: Diagnosis not present

## 2022-10-03 DIAGNOSIS — E1122 Type 2 diabetes mellitus with diabetic chronic kidney disease: Secondary | ICD-10-CM | POA: Diagnosis not present

## 2022-10-03 DIAGNOSIS — K7460 Unspecified cirrhosis of liver: Secondary | ICD-10-CM | POA: Diagnosis not present

## 2022-10-03 DIAGNOSIS — N1831 Chronic kidney disease, stage 3a: Secondary | ICD-10-CM | POA: Diagnosis present

## 2022-10-03 DIAGNOSIS — E039 Hypothyroidism, unspecified: Secondary | ICD-10-CM | POA: Diagnosis present

## 2022-10-03 DIAGNOSIS — I509 Heart failure, unspecified: Secondary | ICD-10-CM

## 2022-10-03 DIAGNOSIS — Z6841 Body Mass Index (BMI) 40.0 and over, adult: Secondary | ICD-10-CM | POA: Diagnosis not present

## 2022-10-03 DIAGNOSIS — J45909 Unspecified asthma, uncomplicated: Secondary | ICD-10-CM | POA: Diagnosis present

## 2022-10-03 DIAGNOSIS — R188 Other ascites: Secondary | ICD-10-CM | POA: Diagnosis not present

## 2022-10-03 DIAGNOSIS — F32A Depression, unspecified: Secondary | ICD-10-CM | POA: Diagnosis present

## 2022-10-03 DIAGNOSIS — K7581 Nonalcoholic steatohepatitis (NASH): Secondary | ICD-10-CM | POA: Diagnosis present

## 2022-10-03 DIAGNOSIS — I5032 Chronic diastolic (congestive) heart failure: Secondary | ICD-10-CM | POA: Diagnosis not present

## 2022-10-03 DIAGNOSIS — I5033 Acute on chronic diastolic (congestive) heart failure: Secondary | ICD-10-CM | POA: Diagnosis not present

## 2022-10-03 DIAGNOSIS — E119 Type 2 diabetes mellitus without complications: Secondary | ICD-10-CM

## 2022-10-03 DIAGNOSIS — Z79899 Other long term (current) drug therapy: Secondary | ICD-10-CM | POA: Diagnosis not present

## 2022-10-03 DIAGNOSIS — Z87891 Personal history of nicotine dependence: Secondary | ICD-10-CM | POA: Diagnosis not present

## 2022-10-03 DIAGNOSIS — K859 Acute pancreatitis without necrosis or infection, unspecified: Secondary | ICD-10-CM | POA: Diagnosis not present

## 2022-10-03 DIAGNOSIS — Z800 Family history of malignant neoplasm of digestive organs: Secondary | ICD-10-CM | POA: Diagnosis not present

## 2022-10-03 DIAGNOSIS — G4733 Obstructive sleep apnea (adult) (pediatric): Secondary | ICD-10-CM | POA: Diagnosis present

## 2022-10-03 DIAGNOSIS — D696 Thrombocytopenia, unspecified: Secondary | ICD-10-CM | POA: Diagnosis not present

## 2022-10-03 DIAGNOSIS — D631 Anemia in chronic kidney disease: Secondary | ICD-10-CM | POA: Diagnosis not present

## 2022-10-03 DIAGNOSIS — R1032 Left lower quadrant pain: Secondary | ICD-10-CM | POA: Diagnosis not present

## 2022-10-03 DIAGNOSIS — F419 Anxiety disorder, unspecified: Secondary | ICD-10-CM | POA: Diagnosis present

## 2022-10-03 DIAGNOSIS — Z8249 Family history of ischemic heart disease and other diseases of the circulatory system: Secondary | ICD-10-CM | POA: Diagnosis not present

## 2022-10-03 DIAGNOSIS — J9601 Acute respiratory failure with hypoxia: Secondary | ICD-10-CM | POA: Diagnosis not present

## 2022-10-03 DIAGNOSIS — Z79890 Hormone replacement therapy (postmenopausal): Secondary | ICD-10-CM | POA: Diagnosis not present

## 2022-10-03 LAB — COMPREHENSIVE METABOLIC PANEL
ALT: 23 U/L (ref 0–44)
AST: 61 U/L — ABNORMAL HIGH (ref 15–41)
Albumin: 2.4 g/dL — ABNORMAL LOW (ref 3.5–5.0)
Alkaline Phosphatase: 72 U/L (ref 38–126)
Anion gap: 11 (ref 5–15)
BUN: 24 mg/dL — ABNORMAL HIGH (ref 8–23)
CO2: 25 mmol/L (ref 22–32)
Calcium: 8.4 mg/dL — ABNORMAL LOW (ref 8.9–10.3)
Chloride: 95 mmol/L — ABNORMAL LOW (ref 98–111)
Creatinine, Ser: 1.86 mg/dL — ABNORMAL HIGH (ref 0.61–1.24)
GFR, Estimated: 40 mL/min — ABNORMAL LOW (ref >60)
Glucose, Bld: 120 mg/dL — ABNORMAL HIGH (ref 70–99)
Potassium: 4 mmol/L (ref 3.5–5.1)
Sodium: 131 mmol/L — ABNORMAL LOW (ref 135–145)
Total Bilirubin: 6.6 mg/dL — ABNORMAL HIGH (ref 0.3–1.2)
Total Protein: 5.8 g/dL — ABNORMAL LOW (ref 6.5–8.1)

## 2022-10-03 LAB — GLUCOSE, CAPILLARY: Glucose-Capillary: 118 mg/dL — ABNORMAL HIGH (ref 70–99)

## 2022-10-03 LAB — HEMOGLOBIN A1C
Hgb A1c MFr Bld: 5.6 % (ref 4.8–5.6)
Mean Plasma Glucose: 114.02 mg/dL

## 2022-10-03 MED ORDER — ACETAMINOPHEN 325 MG PO TABS
650.0000 mg | ORAL_TABLET | Freq: Four times a day (QID) | ORAL | Status: DC | PRN
  Administered 2022-10-03 – 2022-10-05 (×4): 650 mg via ORAL
  Filled 2022-10-03 (×4): qty 2

## 2022-10-03 MED ORDER — FUROSEMIDE 10 MG/ML IJ SOLN
60.0000 mg | Freq: Two times a day (BID) | INTRAMUSCULAR | Status: DC
  Administered 2022-10-03 (×2): 60 mg via INTRAVENOUS
  Filled 2022-10-03 (×2): qty 6

## 2022-10-03 NOTE — Progress Notes (Signed)
Patient placed on full face AUTO CPAP at this time.  His heart rate and respiratory rate are elevated at this time and he is short of breath.  But he is tolerating the CPAP well.

## 2022-10-03 NOTE — Progress Notes (Signed)
PROGRESS NOTE    Daniel Crawford  WUJ:[id number] DOB: [birth year] DOA: 10/02/2022 PCP: Elmer Picker [facility]   Brief Narrative:  Daniel Crawford is a 67 y.o. male with medical history significant for diastolic CHF, NASH liver cirrhosis, hypertension, OSA, diabetes mellitus. Patient presented to the ED with complaints of somewhat acute onset of abdominal pain that started prior to admission with abdominal distention and nausea. Hospitalist called in consult.    Assessment & Plan:   Principal Problem:   Acute pancreatitis Active Problems:   Cirrhosis of liver with ascites (HCC)   Chronic diastolic heart failure (HCC)   Essential hypertension   Morbid obesity (HCC)   T2DM (type 2 diabetes mellitus) (HCC)   CKD stage 3a, GFR 45-59 ml/min (HCC)   OSA (obstructive sleep apnea)   Acute hypoxic respiratory failure Secondary to heart failure exacerbation Profuse edema in lower extremities/abdomen Initiate lasix - follow symptoms/creatinine Echo 06/2022 with diastolic dysfunction - EF 55% -no indication to repeat Follow I's and O's, daily weights  Acute pancreatitis ruled out Lipase minimally elevated (50s near baseline of 40s) - CT negative for pancreatitis Diffuse edema/inflammatory changes noted on CT abdomen/pelvis Advanced diet as tolerated  Cirrhosis of liver with ascites (HCC) Complicated by heart failure has not  History of hepatic encephalopathy and thrombocytopenia.   Platelets 73.  Goal 3 Bowel movements daily - lactulose PRN Continue rifaximin; holding spironolactone to ensure room for diuretics  CKD stage 3a, GFR 45-59 ml/min (HCC) Follow volume status creatinine stable monitor closely while on diuretics   T2DM (type 2 diabetes mellitus) (HCC), well-controlled - Diet controlled -A1c 5.6  Chronic thrombocytopenia Chronic anemia chronic disease  -Essentially baseline, in setting of cirrhosis/CKD, follow repeat labs  Essential hypertension Continue carvedilol,  diuretics as above  DVT prophylaxis: SCDs Start: 10/02/22 2055 Code Status:   Code Status: Full Code Family Communication: None present  Status is: INpt  Dispo: The patient is from: home, lives alone              Anticipated d/c is to: SNF              Anticipated d/c date is: 48-72h              Patient currently NOT medically stable for discharge  Consultants:  None  Procedures:  None  Antimicrobials:  None   Subjective: No acute issues or events overnight, respiratory status improving, abdominal pain resolved, concern for worsening abdominal ascites, distention, extremity edema.  Otherwise denies nausea vomiting diarrhea constipation effusions or chest pain  Objective: Vitals:   10/02/22 2315 10/03/22 0100 10/03/22 0318 10/03/22 0820  BP: (!) 103/47  (!) 104/43 (!) 106/48  Pulse: (!) 129 (!) 131 88 94  Resp: (!) 22 (!) 22 (!) 22 20  Temp: 99.3 F (37.4 C)  99.7 F (37.6 C) 99.6 F (37.6 C)  TempSrc: Oral  Axillary Oral  SpO2: 96% 96% 91% 94%  Weight:      Height:        Intake/Output Summary (Last 24 hours) at 10/03/2022 0943 Last data filed at 10/03/2022 0349 Gross per 24 hour  Intake --  Output 200 ml  Net -200 ml   Filed Weights   10/02/22 1410 10/02/22 1937  Weight: (!) 175.5 kg (!) 183.8 kg    Examination:  General:  Pleasantly resting in bed, No acute distress. HEENT:  Normocephalic atraumatic.  Sclerae nonicteric, noninjected.  Extraocular movements intact bilaterally. Neck: Notable JVD. Lungs: Bibasilar rales Heart: Distant  heart sounds without murmurs, rubs, or gallops. Abdomen:  Soft, nontender, somewhat distended with 1-2+ pitting edema to the umbilicus Extremities: Bilateral lower extremity edema, 4+. Skin:  Warm and dry, no erythema.  Data Reviewed: I have personally reviewed following labs and imaging studies  CBC: Recent Labs  Lab 10/02/22 1433  WBC 5.3  HGB 10.9*  HCT 33.6*  MCV 89.8  PLT 73*   Basic Metabolic Panel: Recent  Labs  Lab 10/02/22 1433 10/03/22 0412  NA 132* 131*  K 3.7 4.0  CL 95* 95*  CO2 27 25  GLUCOSE 194* 120*  BUN 19 24*  CREATININE 1.62* 1.86*  CALCIUM 8.6* 8.4*   GFR: Estimated Creatinine Clearance: 69 mL/min (A) (by C-G formula based on SCr of 1.86 mg/dL (H)). Liver Function Tests: Recent Labs  Lab 10/02/22 1433 10/03/22 0412  AST 58* 61*  ALT 25 23  ALKPHOS 93 72  BILITOT 4.6* 6.6*  PROT 6.3* 5.8*  ALBUMIN 2.7* 2.4*   Recent Labs  Lab 10/02/22 1433  LIPASE 52*   CBG: Recent Labs  Lab 10/03/22 0738  GLUCAP 118*    No results found for this or any previous visit (from the past 240 hour(s)).   Radiology Studies: CT ABDOMEN PELVIS W CONTRAST  Result Date: 10/02/2022 CLINICAL DATA:  Left lower abdominal pain, nausea EXAM: CT ABDOMEN AND PELVIS WITH CONTRAST TECHNIQUE: Multidetector CT imaging of the abdomen and pelvis was performed using the standard protocol following bolus administration of intravenous contrast. RADIATION DOSE REDUCTION: This exam was performed according to the departmental dose-optimization program which includes automated exposure control, adjustment of the mA and/or kV according to patient size and/or use of iterative reconstruction technique. CONTRAST:  OMNIPAQUE IOHEXOL 300 MG/ML  SOLN COMPARISON:  01/22/2022 FINDINGS: Lower chest: Small right pleural effusion, new since previous. No pneumothorax. Visualized lung bases clear. Hepatobiliary: Nodular hepatic contour suggesting cirrhosis. No focal liver lesion or biliary ductal dilatation. Cholecystectomy clips. Portal vein patent. Pancreas: No mass or ductal dilatation. Increase in retroperitoneal inflammatory/edematous changes predominately posterior and inferior to the pancreas. No pseudocyst. Spleen: Splenomegaly 21.4 cm craniocaudal length. No focal lesion. Relatively small splenorenal venous shunt. Adrenals/Urinary Tract: No adrenal mass. Symmetric renal parenchymal enhancement. 4 mm right  lower pole renal calculus. No hydronephrosis. Urinary bladder is nondistended. Stomach/Bowel: Core small esophageal varices. The stomach is nondistended, without acute finding. Small bowel nondilated. Normal appendix. Colon incompletely distended, with suggestion of circumferential wall thickening in multiple segments, focal lesion evident. Vascular/Lymphatic: No AAA. Portal vein patent. Small esophageal varices. IVC patent. Reproductive: Prostate is unremarkable. Other: Small volume predominantly perihepatic ascites.  No free air. Musculoskeletal: Body wall anasarca most marked anteriorly. Moderate paraumbilical hernia containing only mesenteric fat and vessels, no bowel. Sutures of laparoscopic left inguinal hernia repair. Mild multilevel spondylitic changes in the lumbar spine. IMPRESSION: 1. Nonspecific retroperitoneal inflammatory/edematous changes predominately posterior and inferior to the pancreas. Correlate with amylase and lipase to exclude pancreatitis. 2. Cirrhosis with splenomegaly, small volume ascites, and small esophageal varices. 3. Small right pleural effusion, new since previous. 4. Nonobstructive right nephrolithiasis. 5. Moderate paraumbilical hernia containing mesenteric fat and vessels, no bowel. Electronically Signed   By: Corlis Leak M.D.   On: 10/02/2022 16:51      Scheduled Meds:  carvedilol  6.25 mg Oral BID WC   levothyroxine  175 mcg Oral Q0600   pantoprazole (PROTONIX) IV  40 mg Intravenous Daily   pneumococcal 20-valent conjugate vaccine  0.5 mL Intramuscular Tomorrow-1000  rifaximin  550 mg Oral BID   Continuous Infusions:   LOS: 0 days    Time spent:    Azucena Fallen, DO [facility]  If 7PM-7AM, please contact night-coverage [email or web address]  10/03/2022, 9:43 AM

## 2022-10-03 NOTE — TOC CM/SW Note (Signed)
Transition of Care [facility]) - Inpatient Brief Assessment   Patient Details  Name: Daniel Crawford MRN: [id number] Date of Birth: 1956-01-25  Transition of [facility]) CM/SW Contact:    Villa Herb, LCSWA Phone Number: 10/03/2022, 10:29 AM   Clinical Narrative: Transition of Care Department [facility]) has reviewed patient and no TOC needs have been identified at this time. We will continue to monitor patient advancement through interdisciplinary progression rounds. If new patient transition needs arise, please place a TOC consult.  Transition of Care Asessment: Insurance and Status: Insurance coverage has been reviewed Patient has primary care physician: Yes Home environment has been reviewed: from home Prior level of function:: independent Prior/Current Home Services: No current home services Social Determinants of Health Reivew: SDOH reviewed no interventions necessary Readmission risk has been reviewed: Yes Transition of care needs: no transition of care needs at this time

## 2022-10-04 DIAGNOSIS — I5033 Acute on chronic diastolic (congestive) heart failure: Secondary | ICD-10-CM | POA: Diagnosis not present

## 2022-10-04 DIAGNOSIS — K7460 Unspecified cirrhosis of liver: Secondary | ICD-10-CM | POA: Diagnosis not present

## 2022-10-04 DIAGNOSIS — E119 Type 2 diabetes mellitus without complications: Secondary | ICD-10-CM | POA: Diagnosis not present

## 2022-10-04 DIAGNOSIS — I5032 Chronic diastolic (congestive) heart failure: Secondary | ICD-10-CM | POA: Diagnosis not present

## 2022-10-04 LAB — CBC
HCT: 27 % — ABNORMAL LOW (ref 39.0–52.0)
Hemoglobin: 8.9 g/dL — ABNORMAL LOW (ref 13.0–17.0)
MCH: 29.6 pg (ref 26.0–34.0)
MCHC: 33 g/dL (ref 30.0–36.0)
MCV: 89.7 fL (ref 80.0–100.0)
Platelets: 58 10*3/uL — ABNORMAL LOW (ref 150–400)
RBC: 3.01 MIL/uL — ABNORMAL LOW (ref 4.22–5.81)
RDW: 17 % — ABNORMAL HIGH (ref 11.5–15.5)
WBC: 5.1 10*3/uL (ref 4.0–10.5)
nRBC: 0 % (ref 0.0–0.2)

## 2022-10-04 LAB — BASIC METABOLIC PANEL
Anion gap: 7 (ref 5–15)
BUN: 36 mg/dL — ABNORMAL HIGH (ref 8–23)
CO2: 28 mmol/L (ref 22–32)
Calcium: 7.9 mg/dL — ABNORMAL LOW (ref 8.9–10.3)
Chloride: 93 mmol/L — ABNORMAL LOW (ref 98–111)
Creatinine, Ser: 2.04 mg/dL — ABNORMAL HIGH (ref 0.61–1.24)
GFR, Estimated: 36 mL/min — ABNORMAL LOW (ref >60)
Glucose, Bld: 128 mg/dL — ABNORMAL HIGH (ref 70–99)
Potassium: 4.2 mmol/L (ref 3.5–5.1)
Sodium: 128 mmol/L — ABNORMAL LOW (ref 135–145)

## 2022-10-04 LAB — GLUCOSE, CAPILLARY: Glucose-Capillary: 123 mg/dL — ABNORMAL HIGH (ref 70–99)

## 2022-10-04 NOTE — Evaluation (Signed)
Occupational Therapy Evaluation Patient Details Name: Daniel Crawford MRN: [id number] DOB: 05-02-1955 Today's Date: 10/04/2022   History of Present Illness Daniel Crawford is a 67 y.o. male with medical history significant for diastolic CHF, NASH liver cirrhosis, hypertension, OSA, diabetes mellitus.  Patient presented to the ED with complaints of abdominal pain that started today, ports pain radiates to his back.  Reports nausea without vomiting.  He has chronic difficulty breathing that is unchanged.  His lower extremity swelling is unchanged also.  He is compliant with his medications which include torsemide, and rifaximin.   He does not drink alcohol. (per MD)   Clinical Impression   Pt agreeable to OT evaluation. Pt reports leaning on walls in home and use of quad cane for ambulation outside of the house. Pt reports independence for ADL's. Today pt required mod to max AD for transfer to the chair. Mod A for bed mobility with labored effort. Pt noted by anxious when initially standing. Oxygen saturation was at 98% SpO2 after sitting at the EOB despite pt wheezing with labored breathing. Pt is weak in B UE with poor endurance overall. Pt left in the chair with family present and nurse notified. Pt will benefit from continued OT in the hospital and recommended venue below to increase strength, balance, and endurance for safe ADL's.        Recommendations for follow up therapy are one component of a multi-disciplinary discharge planning process, led by the attending physician.  Recommendations Carrender be updated based on patient status, additional functional criteria and insurance authorization.   Assistance Recommended at Discharge Intermittent Supervision/Assistance  Patient can return home with the following A lot of help with walking and/or transfers;A lot of help with bathing/dressing/bathroom;Assistance with cooking/housework;Assist for transportation;Help with stairs or ramp for entrance    Functional  Status Assessment  Patient has had a recent decline in their functional status and demonstrates the ability to make significant improvements in function in a reasonable and predictable amount of time.  Equipment Recommendations  None recommended by OT           Precautions / Restrictions Precautions Precautions: Fall Restrictions Weight Bearing Restrictions: No      Mobility Bed Mobility Overal bed mobility: Needs Assistance Bed Mobility: Supine to Sit     Supine to sit: HOB elevated, Min assist, Mod assist     General bed mobility comments: slow labored effort; assist to pull to sit.    Transfers Overall transfer level: Needs assistance Equipment used: Rolling walker (2 wheels) Transfers: Sit to/from Stand, Bed to chair/wheelchair/BSC Sit to Stand: Mod assist, Max assist     Step pivot transfers: Mod assist     General transfer comment: Anxious when standing; cuing needed to remain upright; extended time and labored movement for step pivot to the chair with RW.      Balance Overall balance assessment: Needs assistance Sitting-balance support: No upper extremity supported, Feet supported Sitting balance-Leahy Scale: Fair Sitting balance - Comments: fair to good seated at EOB   Standing balance support: Bilateral upper extremity supported, During functional activity, Reliant on assistive device for balance Standing balance-Leahy Scale: Poor Standing balance comment: using RW                           ADL either performed or assessed with clinical judgement   ADL Overall ADL's : Needs assistance/impaired     Grooming: Set up;Sitting   Upper Body Bathing: Set up;Sitting  Lower Body Bathing: Moderate assistance;Sitting/lateral leans   Upper Body Dressing : Set up;Sitting   Lower Body Dressing: Maximal assistance;Bed level Lower Body Dressing Details (indicate cue type and reason): Assited to don socks while supine in bed. Toilet Transfer:  Moderate assistance;Maximal assistance;Rolling walker (2 wheels);Stand-pivot Statistician Details (indicate cue type and reason): Simulated via EOB to chair transfer with RW. Toileting- Clothing Manipulation and Hygiene: Min guard;Minimal assistance;Sitting/lateral lean       Functional mobility during ADLs: Moderate assistance;Maximal assistance;Rolling walker (2 wheels)       Vision Baseline Vision/History: 1 Wears glasses Ability to See in Adequate Light: 0 Adequate Patient Visual Report: No change from baseline Vision Assessment?: No apparent visual deficits                Pertinent Vitals/Pain Pain Assessment Pain Assessment: 0-10 Pain Score: 8  Pain Location: B Legs ; stomach Pain Descriptors / Indicators: Cramping Pain Intervention(s): Limited activity within patient's tolerance, Monitored during session, Repositioned     Hand Dominance Right   Extremity/Trunk Assessment Upper Extremity Assessment Upper Extremity Assessment: Generalized weakness   Lower Extremity Assessment Lower Extremity Assessment: Defer to PT evaluation   Cervical / Trunk Assessment Cervical / Trunk Assessment: Normal   Communication Communication Communication: No difficulties   Cognition Arousal/Alertness: Awake/alert Behavior During Therapy: WFL for tasks assessed/performed Overall Cognitive Status: Within Functional Limits for tasks assessed                                                        Home Living Family/patient expects to be discharged to:: Private residence Living Arrangements: Alone Available Help at Discharge: Family;Available PRN/intermittently;Friend(s) Type of Home: Apartment Home Access: Level entry     Home Layout: One level     Bathroom Shower/Tub: Producer, television/film/video: Handicapped height Bathroom Accessibility: Yes How Accessible: Accessible via walker Home Equipment: Rolling Walker (2 wheels);Grab bars -  tub/shower;Cane - quad;Shower seat (Lift chair)          Prior Functioning/Environment Prior Level of Function : Independent/Modified Independent;History of Falls (last six months)             Mobility Comments: Leans on walls within the home; quad cane used when outside. ADLs Comments: Independent        OT Problem List: Decreased strength;Decreased activity tolerance;Impaired balance (sitting and/or standing);Obesity;Pain;Increased edema      OT Treatment/Interventions: Self-care/ADL training;Therapeutic exercise;DME and/or AE instruction;Therapeutic activities;Patient/family education;Balance training    OT Goals(Current goals can be found in the care plan section) Acute Rehab OT Goals Patient Stated Goal: return home OT Goal Formulation: With patient Time For Goal Achievement: 10/18/22 Potential to Achieve Goals: Good  OT Frequency: Min 2X/week                                   End of Session Equipment Utilized During Treatment: Rolling walker (2 wheels);Gait belt  Activity Tolerance: Patient tolerated treatment well Patient left: in chair;with call bell/phone within reach;with family/visitor present  OT Visit Diagnosis: Unsteadiness on feet (R26.81);Other abnormalities of gait and mobility (R26.89);Repeated falls (R29.6);Muscle weakness (generalized) (M62.81);History of falling (Z91.81)                Time: 1610-9604 OT Time  Calculation (min): 32 min Charges:  OT General Charges $OT Visit: 1 Visit OT Evaluation $OT Eval Low Complexity: 1 Low  Marcelene Weidemann OT, MOT  Danie Chandler 10/04/2022, 4:01 PM

## 2022-10-04 NOTE — Plan of Care (Signed)
  Problem: Education: Goal: Knowledge of General Education information will improve Description Including pain rating scale, medication(s)/side effects and non-pharmacologic comfort measures Outcome: Progressing   

## 2022-10-04 NOTE — Plan of Care (Signed)
  Problem: Acute Rehab OT Goals (only OT should resolve) Goal: Pt. Will Perform Grooming Flowsheets (Taken 10/04/2022 1601) Pt Will Perform Grooming:  with min guard assist  standing Goal: Pt. Will Perform Upper Body Dressing Flowsheets (Taken 10/04/2022 1601) Pt Will Perform Upper Body Dressing:  with modified independence  sitting Goal: Pt. Will Perform Lower Body Dressing Flowsheets (Taken 10/04/2022 1601) Pt Will Perform Lower Body Dressing:  with min guard assist  sitting/lateral leans Goal: Pt. Will Transfer To Toilet Flowsheets (Taken 10/04/2022 1601) Pt Will Transfer to Toilet:  with min guard assist  stand pivot transfer Goal: Pt. Will Perform Toileting-Clothing Manipulation Flowsheets (Taken 10/04/2022 1601) Pt Will Perform Toileting - Clothing Manipulation and hygiene:  with modified independence  sitting/lateral leans Goal: Pt/Caregiver Will Perform Home Exercise Program Flowsheets (Taken 10/04/2022 1601) Pt/caregiver will Perform Home Exercise Program:  Increased strength  Both right and left upper extremity  Independently   Anjeanette Petzold OT, MOT

## 2022-10-04 NOTE — TOC Initial Note (Signed)
Transition of Care [facility]) - Initial/Assessment Note    Patient Details  Name: Daniel Crawford MRN: [id number] Date of Birth: [birth year]  [facility] CM/SW Contact:    Villa Herb, LCSWA Phone Number: 10/04/2022, 9:37 AM  Clinical Narrative:                 Pt is high risk for readmission. CSW spoke with pt to complete assessment. Pt states that he lives alone and has been independent in completing his ADLs. Pt is able to drive to appointments when needed. Pt has not had HH in the past. Pt has a cane and walker to use when needed in the home. TOC to follow.   Expected Discharge Plan: Home/Self Care Barriers to Discharge: Continued Medical Work up   Patient Goals and CMS Choice Patient states their goals for this hospitalization and ongoing recovery are:: get better CMS Medicare.gov Compare Post Acute Care list provided to:: Patient Choice offered to / list presented to : Patient      Expected Discharge Plan and Services In-house Referral: Clinical Social Work Discharge Planning Services: CM Consult   Living arrangements for the past 2 months: Single Family Home                                      Prior Living Arrangements/Services Living arrangements for the past 2 months: Single Family Home Lives with:: Self Patient language and need for interpreter reviewed:: Yes Do you feel safe going back to the place where you live?: Yes      Need for Family Participation in Patient Care: Yes (Comment) Care giver support system in place?: Yes (comment) Current home services: DME Criminal Activity/Legal Involvement Pertinent to Current Situation/Hospitalization: No - Comment as needed  Activities of Daily Living Home Assistive Devices/Equipment: Shower chair with back, Raised toilet seat with rails, Cane (specify quad or straight) ADL Screening (condition at time of admission) Patient's cognitive ability adequate to safely complete daily activities?: Yes Is the  patient deaf or have difficulty hearing?: No Does the patient have difficulty seeing, even when wearing glasses/contacts?: No Does the patient have difficulty concentrating, remembering, or making decisions?: No Patient able to express need for assistance with ADLs?: No Does the patient have difficulty dressing or bathing?: No Independently performs ADLs?: Yes (appropriate for developmental age) Does the patient have difficulty walking or climbing stairs?: Yes Weakness of Legs: Both Weakness of Arms/Hands: None  Permission Sought/Granted                  Emotional Assessment Appearance:: Appears stated age Attitude/Demeanor/Rapport: Engaged Affect (typically observed): Accepting Orientation: : Oriented to Self, Oriented to Place, Oriented to  Time, Oriented to Situation Alcohol / Substance Use: Not Applicable Psych Involvement: No (comment)  Admission diagnosis:  Acute pancreatitis [K85.90] Peripheral edema [R60.0] Acute pancreatitis, unspecified complication status, unspecified pancreatitis type [K85.90] Acute exacerbation of congestive heart failure (HCC) [I50.9] Patient Active Problem List   Diagnosis Date Noted   Acute exacerbation of congestive heart failure (HCC) 10/03/2022   Acute pancreatitis 10/02/2022   Hepatic encephalopathy (HCC) 07/10/2022   T2DM (type 2 diabetes mellitus) (HCC) 07/06/2022   CKD stage 3a, GFR 45-59 ml/min (HCC) 07/06/2022   Decompensated liver disease (HCC) 07/04/2022   Morbid obesity (HCC) 05/25/2022   Decompensation of cirrhosis of liver (HCC) 05/21/2022   Pancytopenia (HCC) 05/21/2022   Acute exacerbation of CHF (congestive  heart failure) (HCC) 04/06/2022   Liver cirrhosis secondary to NASH (HCC) 03/13/2022   Iron deficiency anemia 02/28/2022   Hypoalbuminemia due to protein-calorie malnutrition (HCC) 02/28/2022   GERD (gastroesophageal reflux disease) 02/28/2022   Abdominal pain 02/28/2022   Anasarca 01/23/2022   Generalized weakness  01/23/2022   Essential hypertension 01/23/2022   Volume overload 01/23/2022   Hypervolemia    Elevated TSH 01/22/2022   D-dimer, elevated 01/22/2022   Protein calorie malnutrition (HCC) 01/22/2022   Chronic diastolic heart failure (HCC)    Hypogonadism, male 12/26/2021   History of colonic polyps 07/27/2021   Auditory hallucinations 07/27/2021   Leukopenia 04/19/2021   Hypokalemia 04/18/2021   Dyspnea on exertion 01/20/2020   OSA (obstructive sleep apnea) 01/20/2020   History of ascites 10/22/2019   Acquired hypothyroidism 10/16/2016   Cirrhosis of liver with ascites (HCC) 10/16/2016   Depression with anxiety 10/16/2016   Thrombocytopenia (HCC) 10/16/2016   Spleen enlarged 10/16/2016   PCP:  [facility]:   [id number] - [place], [place] - [address] AT [facility] [address] [place] [address] Phone: [phone] Fax: [phone]     Social Determinants of Health (SDOH) Social History: SDOH Screenings   Food Insecurity: No Food Insecurity (10/02/2022)  Housing: Low Risk  (10/02/2022)  Transportation Needs: No Transportation Needs (10/02/2022)  Utilities: Not At Risk (10/02/2022)  Depression (PHQ2-9): Medium Risk (10/27/2019)  Financial Resource Strain: Low Risk  (03/28/2021)   Received from [facility]  Tobacco Use: Medium Risk (10/02/2022)   SDOH Interventions:     Readmission Risk Interventions    10/04/2022    9:36 AM 07/11/2022   12:24 PM 07/05/2022   12:16 PM  Readmission Risk Prevention Plan  Transportation Screening Complete Complete Complete  Medication Review Oceanographer) Complete Complete Complete  HRI or Home Care Consult Complete Complete Complete  SW Recovery Care/Counseling Consult Complete Complete Complete  Palliative Care Screening Not Applicable -- Not Applicable  Skilled Nursing Facility Not Applicable Not Applicable Not Applicable

## 2022-10-04 NOTE — Progress Notes (Signed)
PROGRESS NOTE    Daniel Crawford  KGM:[id number] DOB: Sep 16, 1955 DOA: 10/02/2022 PCP: Elmer Picker [facility]   Brief Narrative:  Daniel Crawford is a 67 y.o. male with medical history significant for diastolic CHF, NASH liver cirrhosis, hypertension, OSA, diabetes mellitus. Patient presented to the ED with complaints of somewhat acute onset of abdominal pain that started prior to admission with abdominal distention and nausea. Hospitalist called in consult.   Assessment & Plan:   Principal Problem:   Acute pancreatitis Active Problems:   Cirrhosis of liver with ascites (HCC)   Chronic diastolic heart failure (HCC)   Essential hypertension   Morbid obesity (HCC)   T2DM (type 2 diabetes mellitus) (HCC)   CKD stage 3a, GFR 45-59 ml/min (HCC)   OSA (obstructive sleep apnea)   Acute exacerbation of congestive heart failure (HCC)  Acute hypoxic respiratory failure Secondary to heart failure exacerbation Profuse edema in lower extremities/abdomen Hold lasix -if creatinine improves will resume home torsemide Echo 06/2022 with diastolic dysfunction - EF 55% -no indication to repeat Follow I's and O's, daily weights  Acute pancreatitis ruled out Lipase minimally elevated (peak into 50s near baseline of 40s) - CT negative for pancreatitis Diffuse nonspecific edema/inflammatory changes noted on CT abdomen/pelvis Advanced diet as tolerated  Cirrhosis of liver with ascites (HCC) Complicated by heart failure has not  History of hepatic encephalopathy and thrombocytopenia.   Platelets 73.  Goal 3 Bowel movements daily - lactulose PRN Continue rifaximin; holding spironolactone to ensure room for diuretics  CKD stage 3a, GFR 45-59 ml/min (HCC) Follow volume status creatinine stable monitor closely while on diuretics   T2DM (type 2 diabetes mellitus) (HCC), well-controlled - Diet controlled -A1c 5.6  Chronic thrombocytopenia Chronic anemia chronic disease  -Essentially baseline, in  setting of cirrhosis/CKD, follow repeat labs  Essential hypertension Continue carvedilol, diuretics as above  DVT prophylaxis: SCDs Start: 10/02/22 2055 Code Status:   Code Status: Full Code Family Communication: None present  Status is: Inpatient  Dispo: The patient is from: home, lives alone              Anticipated d/c is to: SNF              Anticipated d/c date is: 48-72h              Patient currently NOT medically stable for discharge  Consultants:  None  Procedures:  None  Antimicrobials:  None   Subjective: No acute issues or events overnight, respiratory status improving, abdominal pain resolving  Objective: Vitals:   10/03/22 1126 10/03/22 1933 10/03/22 2204 10/04/22 0338  BP: (!) 119/57 (!) 97/43  (!) 105/47  Pulse: 83 80 73 77  Resp: 18 18 16 16   Temp: 99 F (37.2 C) 99 F (37.2 C)  98.8 F (37.1 C)  TempSrc: Oral Oral  Oral  SpO2: 100% 94% 93% 95%  Weight:      Height:        Intake/Output Summary (Last 24 hours) at 10/04/2022 1236 Last data filed at 10/04/2022 0700 Gross per 24 hour  Intake 480 ml  Output 1300 ml  Net -820 ml   Filed Weights   10/02/22 1410 10/02/22 1937  Weight: (!) 175.5 kg (!) 183.8 kg    Examination:  General:  Pleasantly resting in bed, No acute distress. HEENT:  Normocephalic atraumatic.  Sclerae nonicteric, noninjected.  Extraocular movements intact bilaterally. Neck: Notable JVD. Lungs: Bibasilar rales Heart: Distant heart sounds without murmurs, rubs, or gallops. Abdomen:  Soft, nontender, 1-2+ pitting edema noted in the panus Extremities: Bilateral lower extremity edema, 4+ Skin:  Warm and dry, no erythema.  Data Reviewed: I have personally reviewed following labs and imaging studies  CBC: Recent Labs  Lab 10/02/22 1433 10/04/22 0332  WBC 5.3 5.1  HGB 10.9* 8.9*  HCT 33.6* 27.0*  MCV 89.8 89.7  PLT 73* 58*   Basic Metabolic Panel: Recent Labs  Lab 10/02/22 1433 10/03/22 0412 10/04/22 0332  NA  132* 131* 128*  K 3.7 4.0 4.2  CL 95* 95* 93*  CO2 27 25 28   GLUCOSE 194* 120* 128*  BUN 19 24* 36*  CREATININE 1.62* 1.86* 2.04*  CALCIUM 8.6* 8.4* 7.9*   GFR: Estimated Creatinine Clearance: 62.9 mL/min (A) (by C-G formula based on SCr of 2.04 mg/dL (H)).  Liver Function Tests: Recent Labs  Lab 10/02/22 1433 10/03/22 0412  AST 58* 61*  ALT 25 23  ALKPHOS 93 72  BILITOT 4.6* 6.6*  PROT 6.3* 5.8*  ALBUMIN 2.7* 2.4*   Recent Labs  Lab 10/02/22 1433  LIPASE 52*   CBG: Recent Labs  Lab 10/03/22 0738 10/04/22 0743  GLUCAP 118* 123*    No results found for this or any previous visit (from the past 240 hour(s)).   Radiology Studies: CT ABDOMEN PELVIS W CONTRAST  Result Date: 10/02/2022 CLINICAL DATA:  Left lower abdominal pain, nausea EXAM: CT ABDOMEN AND PELVIS WITH CONTRAST TECHNIQUE: Multidetector CT imaging of the abdomen and pelvis was performed using the standard protocol following bolus administration of intravenous contrast. RADIATION DOSE REDUCTION: This exam was performed according to the departmental dose-optimization program which includes automated exposure control, adjustment of the mA and/or kV according to patient size and/or use of iterative reconstruction technique. CONTRAST:  OMNIPAQUE IOHEXOL 300 MG/ML  SOLN COMPARISON:  01/22/2022 FINDINGS: Lower chest: Small right pleural effusion, new since previous. No pneumothorax. Visualized lung bases clear. Hepatobiliary: Nodular hepatic contour suggesting cirrhosis. No focal liver lesion or biliary ductal dilatation. Cholecystectomy clips. Portal vein patent. Pancreas: No mass or ductal dilatation. Increase in retroperitoneal inflammatory/edematous changes predominately posterior and inferior to the pancreas. No pseudocyst. Spleen: Splenomegaly 21.4 cm craniocaudal length. No focal lesion. Relatively small splenorenal venous shunt. Adrenals/Urinary Tract: No adrenal mass. Symmetric renal parenchymal enhancement. 4  mm right lower pole renal calculus. No hydronephrosis. Urinary bladder is nondistended. Stomach/Bowel: Core small esophageal varices. The stomach is nondistended, without acute finding. Small bowel nondilated. Normal appendix. Colon incompletely distended, with suggestion of circumferential wall thickening in multiple segments, focal lesion evident. Vascular/Lymphatic: No AAA. Portal vein patent. Small esophageal varices. IVC patent. Reproductive: Prostate is unremarkable. Other: Small volume predominantly perihepatic ascites.  No free air. Musculoskeletal: Body wall anasarca most marked anteriorly. Moderate paraumbilical hernia containing only mesenteric fat and vessels, no bowel. Sutures of laparoscopic left inguinal hernia repair. Mild multilevel spondylitic changes in the lumbar spine. IMPRESSION: 1. Nonspecific retroperitoneal inflammatory/edematous changes predominately posterior and inferior to the pancreas. Correlate with amylase and lipase to exclude pancreatitis. 2. Cirrhosis with splenomegaly, small volume ascites, and small esophageal varices. 3. Small right pleural effusion, new since previous. 4. Nonobstructive right nephrolithiasis. 5. Moderate paraumbilical hernia containing mesenteric fat and vessels, no bowel. Electronically Signed   By: Corlis Leak M.D.   On: 10/02/2022 16:51      Scheduled Meds:  carvedilol  6.25 mg Oral BID WC   levothyroxine  175 mcg Oral Q0600   pantoprazole (PROTONIX) IV  40 mg Intravenous Daily   pneumococcal  20-valent conjugate vaccine  0.5 mL Intramuscular Tomorrow-1000   rifaximin  550 mg Oral BID   Continuous Infusions:   LOS: 1 day    Time spent:    Azucena Fallen, DO [facility]  If 7PM-7AM, please contact night-coverage [email or web address]  10/04/2022, 12:36 PM

## 2022-10-05 DIAGNOSIS — I5032 Chronic diastolic (congestive) heart failure: Secondary | ICD-10-CM | POA: Diagnosis not present

## 2022-10-05 DIAGNOSIS — I5033 Acute on chronic diastolic (congestive) heart failure: Secondary | ICD-10-CM | POA: Diagnosis not present

## 2022-10-05 DIAGNOSIS — K859 Acute pancreatitis without necrosis or infection, unspecified: Secondary | ICD-10-CM | POA: Diagnosis not present

## 2022-10-05 DIAGNOSIS — K7460 Unspecified cirrhosis of liver: Secondary | ICD-10-CM | POA: Diagnosis not present

## 2022-10-05 LAB — BASIC METABOLIC PANEL
Anion gap: 8 (ref 5–15)
BUN: 36 mg/dL — ABNORMAL HIGH (ref 8–23)
CO2: 28 mmol/L (ref 22–32)
Calcium: 7.9 mg/dL — ABNORMAL LOW (ref 8.9–10.3)
Chloride: 93 mmol/L — ABNORMAL LOW (ref 98–111)
Creatinine, Ser: 1.72 mg/dL — ABNORMAL HIGH (ref 0.61–1.24)
GFR, Estimated: 44 mL/min — ABNORMAL LOW (ref >60)
Glucose, Bld: 122 mg/dL — ABNORMAL HIGH (ref 70–99)
Potassium: 3.8 mmol/L (ref 3.5–5.1)
Sodium: 129 mmol/L — ABNORMAL LOW (ref 135–145)

## 2022-10-05 LAB — GLUCOSE, CAPILLARY: Glucose-Capillary: 134 mg/dL — ABNORMAL HIGH (ref 70–99)

## 2022-10-05 MED ORDER — SPIRONOLACTONE 25 MG PO TABS
100.0000 mg | ORAL_TABLET | Freq: Every day | ORAL | Status: DC
  Administered 2022-10-05: 100 mg via ORAL
  Filled 2022-10-05: qty 4

## 2022-10-05 MED ORDER — GABAPENTIN 100 MG PO CAPS: 100.0000 mg | ORAL_CAPSULE | Freq: Every day | ORAL | Status: DC | PRN

## 2022-10-05 MED ORDER — TORSEMIDE 20 MG PO TABS
60.0000 mg | ORAL_TABLET | Freq: Two times a day (BID) | ORAL | Status: DC
  Administered 2022-10-05: 60 mg via ORAL
  Filled 2022-10-05: qty 3

## 2022-10-05 MED ORDER — BOOST PO LIQD
237.0000 mL | Freq: Every evening | ORAL | Status: DC
  Filled 2022-10-05: qty 237

## 2022-10-05 MED ORDER — TORSEMIDE 60 MG PO TABS: 60.0000 mg | ORAL_TABLET | Freq: Two times a day (BID) | ORAL | 0 refills | Status: DC

## 2022-10-05 MED ORDER — SERTRALINE HCL 50 MG PO TABS: 100.0000 mg | ORAL_TABLET | Freq: Every day | ORAL | Status: DC

## 2022-10-05 MED ORDER — ORAL CARE MOUTH RINSE: 15.0000 mL | OROMUCOSAL | Status: DC | PRN

## 2022-10-05 MED ORDER — TRAZODONE HCL 50 MG PO TABS: 50.0000 mg | ORAL_TABLET | Freq: Every day | ORAL | Status: DC

## 2022-10-05 NOTE — Care Management Important Message (Signed)
Important Message  Patient Details  Name: Daniel Crawford MRN: [id number] Date of Birth: [name], 1957   Medicare Important Message Given:  N/A - LOS <3 / Initial given by admissions     [name] 10/05/2022, 11:44 AM

## 2022-10-05 NOTE — Discharge Summary (Signed)
Physician Discharge Summary  Daniel Crawford ZOX:[id number] DOB: 08-21-1955 DOA: 10/02/2022  PCP: [facility]  Admit date: 10/02/2022 Discharge date: 10/05/2022  Admitted From: Home Disposition: Home  Recommendations for Outpatient Follow-up:  Follow up with PCP in 1-2 weeks Please obtain BMP/CBC in one week Please follow up on the following pending results:  Home Health: PT Equipment/Devices: None  Discharge Condition: Stable CODE STATUS: Full Diet recommendation: Low-salt low-fat fluid restricted low-carb diet  Brief/Interim Summary: Daniel Crawford is a 67 y.o. male with medical history significant for diastolic CHF, NASH liver cirrhosis, hypertension, OSA, diabetes mellitus. Patient presented to the ED with complaints of somewhat acute onset of abdominal pain that started prior to admission with abdominal distention and nausea. Hospitalist called in consult.   Patient admitted as above with atypical diffuse abdominal pain.  Initially concerned for pancreatitis with unremarkable CT abdomen pelvis and only minimally elevated lipase of 52.  Patient also noted to have profound edema affecting the lower extremities as well as his abdomen which is likely the cause of his discomfort.  Patient tolerated aggressive diuresis over the past 48 hours and given marked improvement in symptoms his ambulatory status has essentially resolved back to baseline.  Initially given his weakness and instability plan was for discharge to nursing facility however he is now able to walk 200 feet without assistance hypoxia.  Medication changes as below including increasing torsemide to 60 twice daily, recommend ongoing low-salt fluid restricted diet and close follow-up with PCP and cardiology.  Recommend daily weights as well to reduce risk of recurrent exacerbations.  Resume home medications as above otherwise, follow-up closely with PCP and cardiology, stable for discharge home with home health  PT.  Discharge Diagnoses:  Principal Problem:   Acute pancreatitis Active Problems:   Cirrhosis of liver with ascites (HCC)   Chronic diastolic heart failure (HCC)   Essential hypertension   Morbid obesity (HCC)   T2DM (type 2 diabetes mellitus) (HCC)   CKD stage 3a, GFR 45-59 ml/min (HCC)   OSA (obstructive sleep apnea)   Acute exacerbation of congestive heart failure (HCC)  Acute hypoxic respiratory failure, resolving Secondary to heart failure exacerbation Profuse edema in lower extremities/abdomen Echo 06/2022 with diastolic dysfunction - EF 55% -no indication to repeat Approaching dry weight - continue strict diet/salt restriction/fluid restriction   Acute pancreatitis ruled out Lipase minimally elevated (peak into 50s near baseline of 40s) - CT negative for pancreatitis Diffuse nonspecific edema/inflammatory changes noted on CT abdomen/pelvis Advanced diet as tolerated without issue/intolerance   Cirrhosis of liver with ascites (HCC) Complicated by heart failure has not  History of hepatic encephalopathy and thrombocytopenia.   Platelets continue to fluctuate - near baseline (50-70s) Continue rifaximin and spironolactone   CKD stage 3a, GFR 45-59 ml/min (HCC) Stable   T2DM (type 2 diabetes mellitus) (HCC), well-controlled Diet controlled -A1c 5.6   Chronic thrombocytopenia Chronic anemia chronic disease  Essentially baseline, in setting of cirrhosis/CKD, follow repeat labs   Essential hypertension Continue carvedilol, diuretics as above  Discharge Instructions  Discharge Instructions     Face-to-face encounter (required for Medicare/Medicaid patients)   Complete by: As directed    The encounter with the patient was in whole, or in part, for the following medical condition, which is the primary reason for home health care: ambulatory dysfunction, heart failure exacerbation   I certify that, based on my findings, the following services are medically necessary  home health services: Physical therapy   Reason for Medically Necessary  Home Health Services:  Therapy- Skilled Evaluation of Speech Comprehension and Safe Swallowing Therapy- Investment banker, operational, Patent examiner Therapy- Home Adaptation to Facilitate Safety     My clinical findings support the need for the above services:  Unable to leave home safely without assistance and/or assistive device Shortness of breath with activity     Further, I certify that my clinical findings support that this patient is homebound due to:  Unable to leave home safely without assistance Shortness of Breath with activity     Home Health   Complete by: As directed    To provide the following care/treatments:  PT OT        Allergies as of 10/05/2022   No Known Allergies      Medication List     TAKE these medications    acetaminophen 500 MG tablet Commonly known as: TYLENOL Take 1,000 mg by mouth every 6 (six) hours as needed for moderate pain.   albuterol 108 (90 Base) MCG/ACT inhaler Commonly known as: VENTOLIN HFA Inhale 2 puffs into the lungs every 6 (six) hours as needed for wheezing or shortness of breath.   carvedilol 6.25 MG tablet Commonly known as: COREG Take 1 tablet (6.25 mg total) by mouth 2 (two) times daily with a meal.   gabapentin 100 MG capsule Commonly known as: NEURONTIN Take 100 mg by mouth daily as needed (pain).   hydrOXYzine 50 MG capsule Commonly known as: VISTARIL Take 1 capsule (50 mg total) by mouth 3 (three) times daily as needed for anxiety or nausea.   lactose free nutrition Liqd Take 237 mLs by mouth every evening.   lactulose 10 GM/15ML solution Commonly known as: CHRONULAC Take 30 mLs (20 g total) by mouth 2 (two) times daily.   levothyroxine 175 MCG tablet Commonly known as: SYNTHROID Take 175 mcg by mouth daily before breakfast.   ondansetron 8 MG tablet Commonly known as: ZOFRAN Take by mouth every 8 (eight) hours as needed  for nausea or vomiting.   pantoprazole 40 MG tablet Commonly known as: PROTONIX Take 1 tablet (40 mg total) by mouth daily.   potassium chloride SA 20 MEQ tablet Commonly known as: KLOR-CON M Take 2 tablets (40 mEq total) by mouth 2 (two) times daily.   rifaximin 550 MG Tabs tablet Commonly known as: XIFAXAN Take 1 tablet (550 mg total) by mouth 2 (two) times daily.   sertraline 50 MG tablet Commonly known as: ZOLOFT Take 1 tablet (50 mg total) by mouth at bedtime. What changed: how much to take   spironolactone 100 MG tablet Commonly known as: ALDACTONE Take 1 tablet (100 mg total) by mouth daily.   Torsemide 60 MG Tabs Take 60 mg by mouth 2 (two) times daily. What changed:  medication strength how much to take additional instructions   traZODone 50 MG tablet Commonly known as: DESYREL Take 1 tablet (50 mg total) by mouth at bedtime.        Follow-up Information     [facility] Follow up.   Why: PT will call to schedule your first visit.               No Known Allergies  Consultations: None   Procedures/Studies: CT ABDOMEN PELVIS W CONTRAST  Result Date: 10/02/2022 CLINICAL DATA:  Left lower abdominal pain, nausea EXAM: CT ABDOMEN AND PELVIS WITH CONTRAST TECHNIQUE: Multidetector CT imaging of the abdomen and pelvis was performed using the standard protocol following bolus administration of  intravenous contrast. RADIATION DOSE REDUCTION: This exam was performed according to the departmental dose-optimization program which includes automated exposure control, adjustment of the mA and/or kV according to patient size and/or use of iterative reconstruction technique. CONTRAST:  OMNIPAQUE IOHEXOL 300 MG/ML  SOLN COMPARISON:  01/22/2022 FINDINGS: Lower chest: Small right pleural effusion, new since previous. No pneumothorax. Visualized lung bases clear. Hepatobiliary: Nodular hepatic contour suggesting cirrhosis. No focal liver lesion or biliary  ductal dilatation. Cholecystectomy clips. Portal vein patent. Pancreas: No mass or ductal dilatation. Increase in retroperitoneal inflammatory/edematous changes predominately posterior and inferior to the pancreas. No pseudocyst. Spleen: Splenomegaly 21.4 cm craniocaudal length. No focal lesion. Relatively small splenorenal venous shunt. Adrenals/Urinary Tract: No adrenal mass. Symmetric renal parenchymal enhancement. 4 mm right lower pole renal calculus. No hydronephrosis. Urinary bladder is nondistended. Stomach/Bowel: Core small esophageal varices. The stomach is nondistended, without acute finding. Small bowel nondilated. Normal appendix. Colon incompletely distended, with suggestion of circumferential wall thickening in multiple segments, focal lesion evident. Vascular/Lymphatic: No AAA. Portal vein patent. Small esophageal varices. IVC patent. Reproductive: Prostate is unremarkable. Other: Small volume predominantly perihepatic ascites.  No free air. Musculoskeletal: Body wall anasarca most marked anteriorly. Moderate paraumbilical hernia containing only mesenteric fat and vessels, no bowel. Sutures of laparoscopic left inguinal hernia repair. Mild multilevel spondylitic changes in the lumbar spine. IMPRESSION: 1. Nonspecific retroperitoneal inflammatory/edematous changes predominately posterior and inferior to the pancreas. Correlate with amylase and lipase to exclude pancreatitis. 2. Cirrhosis with splenomegaly, small volume ascites, and small esophageal varices. 3. Small right pleural effusion, new since previous. 4. Nonobstructive right nephrolithiasis. 5. Moderate paraumbilical hernia containing mesenteric fat and vessels, no bowel. Electronically Signed   By: Corlis Leak M.D.   On: 10/02/2022 16:51   US Abdomen Limited RUQ (LIVER/GB)  Result Date: 09/21/2022 CLINICAL DATA:  Cirrhosis.  HCC screening. EXAM: ULTRASOUND ABDOMEN LIMITED RIGHT UPPER QUADRANT COMPARISON:  March 22, 2022 ultrasound.  FINDINGS: Gallbladder: Surgically absent Common bile duct: Diameter: 3.9 mm Liver: Increased heterogeneous echogenicity. Mildly nodular contour. No liver mass identified. Portal vein is patent on color Doppler imaging with normal direction of blood flow towards the liver. Other: None. IMPRESSION: 1. Cirrhotic liver. No liver mass identified. 2. The gallbladder is surgically absent. Electronically Signed   By: Gerome Sam III M.D.   On: 09/21/2022 11:51   DG Chest 2 View  Result Date: 09/15/2022 CLINICAL DATA:  Shortness of breath. EXAM: CHEST - 2 VIEW COMPARISON:  July 04, 2022. FINDINGS: The heart size and mediastinal contours are within normal limits. Both lungs are clear. The visualized skeletal structures are unremarkable. IMPRESSION: No active cardiopulmonary disease. Electronically Signed   By: Lupita Raider M.D.   On: 09/15/2022 13:40     Subjective: No acute issues/events overnight   Discharge Exam: Vitals:   10/05/22 0858 10/05/22 0900  BP:  121/64  Pulse:  63  Resp:  20  Temp:    SpO2: 98% 97%   Vitals:   10/04/22 2029 10/05/22 0413 10/05/22 0858 10/05/22 0900  BP: 127/67 (!) 123/58  121/64  Pulse: 77 74  63  Resp: 20 18  20   Temp: 97.9 F (36.6 C) 97.9 F (36.6 C)    TempSrc: Oral     SpO2: 98% 97% 98% 97%  Weight:      Height:        General:  Pleasantly resting in bed, No acute distress. HEENT:  Normocephalic atraumatic.  Sclerae nonicteric, noninjected.  Extraocular movements intact bilaterally.  Neck: Scant JVD. Lungs: Bibasilar rales Heart: Distant heart sounds without murmurs, rubs, or gallops. Abdomen:  Soft, nontender, 1-2+ pitting edema noted in the panus Extremities: Bilateral lower extremity edema, 2+ Skin:  Warm and dry, no erythema.   The results of significant diagnostics from this hospitalization (including imaging, microbiology, ancillary and laboratory) are listed below for reference.     Microbiology: No results found for this or any  previous visit (from the past 240 hour(s)).   Labs: BNP (last 3 results) Recent Labs    07/25/22 1240 08/14/22 1157 09/15/22 1215  BNP 61.4 22.8 95.0   Basic Metabolic Panel: Recent Labs  Lab 10/02/22 1433 10/03/22 0412 10/04/22 0332 10/05/22 0408  NA 132* 131* 128* 129*  K 3.7 4.0 4.2 3.8  CL 95* 95* 93* 93*  CO2 27 25 28 28   GLUCOSE 194* 120* 128* 122*  BUN 19 24* 36* 36*  CREATININE 1.62* 1.86* 2.04* 1.72*  CALCIUM 8.6* 8.4* 7.9* 7.9*   Liver Function Tests: Recent Labs  Lab 10/02/22 1433 10/03/22 0412  AST 58* 61*  ALT 25 23  ALKPHOS 93 72  BILITOT 4.6* 6.6*  PROT 6.3* 5.8*  ALBUMIN 2.7* 2.4*   Recent Labs  Lab 10/02/22 1433  LIPASE 52*   No results for input(s): "AMMONIA" in the last 168 hours. CBC: Recent Labs  Lab 10/02/22 1433 10/04/22 0332  WBC 5.3 5.1  HGB 10.9* 8.9*  HCT 33.6* 27.0*  MCV 89.8 89.7  PLT 73* 58*   CBG: Recent Labs  Lab 10/03/22 0738 10/04/22 0743 10/05/22 0717  GLUCAP 118* 123* 134*   Urinalysis    Component Value Date/Time   COLORURINE YELLOW 10/02/2022 1732   APPEARANCEUR CLEAR 10/02/2022 1732   LABSPEC >1.046 (H) 10/02/2022 1732   PHURINE 5.0 10/02/2022 1732   GLUCOSEU NEGATIVE 10/02/2022 1732   HGBUR SMALL (A) 10/02/2022 1732   BILIRUBINUR NEGATIVE 10/02/2022 1732   KETONESUR NEGATIVE 10/02/2022 1732   PROTEINUR NEGATIVE 10/02/2022 1732   NITRITE NEGATIVE 10/02/2022 1732   LEUKOCYTESUR NEGATIVE 10/02/2022 1732   Sepsis Labs Recent Labs  Lab 10/02/22 1433 10/04/22 0332  WBC 5.3 5.1    Time coordinating discharge: Over 30 minutes  SIGNED:   Azucena Fallen, DO [facility] 10/05/2022, 3:32 PM Pager   If 7PM-7AM, please contact night-coverage [email or web address]

## 2022-10-05 NOTE — Plan of Care (Signed)
  Problem: Education: Goal: Knowledge of General Education information will improve Description Including pain rating scale, medication(s)/side effects and non-pharmacologic comfort measures Outcome: Progressing   

## 2022-10-05 NOTE — Evaluation (Signed)
Physical Therapy Evaluation Patient Details Name: Daniel Crawford MRN: [id number] DOB: Oct 31, 1955 Today's Date: 10/05/2022  History of Present Illness  Daniel Crawford is a 67 y.o. male with medical history significant for diastolic CHF, NASH liver cirrhosis, hypertension, OSA, diabetes mellitus.  Patient presented to the ED with complaints of abdominal pain that started today, ports pain radiates to his back.  Reports nausea without vomiting.  He has chronic difficulty breathing that is unchanged.  His lower extremity swelling is unchanged also.  He is compliant with his medications which include torsemide, and rifaximin.   He does not drink alcohol.   Clinical Impression  Patient limited for functional mobility as stated below secondary to BLE weakness, fatigue and impaired standing balance. Patient with slow, labored transition to EOB but does not require physical assist. Patient demonstrates good sitting balance and sitting tolerance at EOB. He requires min g/a to transfer to standing with RW and relies on momentum to power up. Patient able to ambulate in hall with RW without loss of balance but gradually increasing SOB. Patient returned to room at end of session. Patient will benefit from continued physical therapy in hospital and recommended venue below to increase strength, balance, endurance for safe ADLs and gait.         Assistance Recommended at Discharge Intermittent Supervision/Assistance  If plan is discharge home, recommend the following:  Can travel by private vehicle  A little help with bathing/dressing/bathroom;Assistance with cooking/housework;A little help with walking and/or transfers        Equipment Recommendations None recommended by PT  Recommendations for Other Services       Functional Status Assessment Patient has had a recent decline in their functional status and demonstrates the ability to make significant improvements in function in a reasonable and predictable amount of  time.     Precautions / Restrictions Precautions Precautions: Fall Restrictions Weight Bearing Restrictions: No      Mobility  Bed Mobility Overal bed mobility: Modified Independent Bed Mobility: Supine to Sit, Sit to Supine           General bed mobility comments: slow labored effort    Transfers Overall transfer level: Needs assistance Equipment used: Rolling walker (2 wheels) Transfers: Sit to/from Stand, Bed to chair/wheelchair/BSC Sit to Stand: Min guard, Min assist   Step pivot transfers: Min guard       General transfer comment: uses momentum to power up to standing    Ambulation/Gait Ambulation/Gait assistance: Min guard Gait Distance (Feet): 100 Feet Assistive device: Rolling walker (2 wheels) Gait Pattern/deviations: Step-through pattern Gait velocity: decreased     General Gait Details: labored cadence with use of RW, gradually increasing SOB  Stairs            Wheelchair Mobility     Tilt Bed    Modified Rankin (Stroke Patients Only)       Balance Overall balance assessment: Needs assistance Sitting-balance support: No upper extremity supported, Feet supported Sitting balance-Leahy Scale: Fair Sitting balance - Comments: fair to good seated at EOB   Standing balance support: Bilateral upper extremity supported, During functional activity, Reliant on assistive device for balance Standing balance-Leahy Scale: Fair Standing balance comment: using RW                             Pertinent Vitals/Pain Pain Assessment Pain Assessment: No/denies pain    Home Living Family/patient expects to be discharged to:: Private residence Living Arrangements:  Alone Available Help at Discharge: Family;Available PRN/intermittently;Friend(s) Type of Home: Apartment Home Access: Level entry       Home Layout: One level Home Equipment: Rolling Walker (2 wheels);Grab bars - tub/shower;Cane - quad;Shower seat (Lift chair)       Prior Function Prior Level of Function : Independent/Modified Independent;History of Falls (last six months)             Mobility Comments: Leans on walls within the home; quad cane used when outside. ADLs Comments: Independent     Hand Dominance   Dominant Hand: Right    Extremity/Trunk Assessment   Upper Extremity Assessment Upper Extremity Assessment: Defer to OT evaluation    Lower Extremity Assessment Lower Extremity Assessment: Generalized weakness    Cervical / Trunk Assessment Cervical / Trunk Assessment: Normal  Communication   Communication: No difficulties  Cognition Arousal/Alertness: Awake/alert Behavior During Therapy: WFL for tasks assessed/performed Overall Cognitive Status: Within Functional Limits for tasks assessed                                          General Comments      Exercises     Assessment/Plan    PT Assessment Patient needs continued PT services  PT Problem List Decreased strength;Decreased activity tolerance;Decreased balance;Decreased mobility       PT Treatment Interventions DME instruction;Balance training;Gait training;Neuromuscular re-education;Stair training;Functional mobility training;Patient/family education;Therapeutic activities;Therapeutic exercise;Manual techniques    PT Goals (Current goals can be found in the Care Plan section)  Acute Rehab PT Goals Patient Stated Goal: return home PT Goal Formulation: With patient Time For Goal Achievement: 10/12/22 Potential to Achieve Goals: Good    Frequency Min 3X/week     Co-evaluation               AM-PAC PT "6 Clicks" Mobility  Outcome Measure Help needed turning from your back to your side while in a flat bed without using bedrails?: None Help needed moving from lying on your back to sitting on the side of a flat bed without using bedrails?: A Little Help needed moving to and from a bed to a chair (including a wheelchair)?: A  Little Help needed standing up from a chair using your arms (e.g., wheelchair or bedside chair)?: A Little Help needed to walk in hospital room?: A Little Help needed climbing 3-5 steps with a railing? : A Lot 6 Click Score: 18    End of Session Equipment Utilized During Treatment: Gait belt Activity Tolerance: Patient tolerated treatment well Patient left: in bed;with call bell/phone within reach Nurse Communication: Mobility status PT Visit Diagnosis: Unsteadiness on feet (R26.81);Other abnormalities of gait and mobility (R26.89);Muscle weakness (generalized) (M62.81)    Time: 1610-9604 PT Time Calculation (min) (ACUTE ONLY): 12 min   Charges:   PT Evaluation $PT Eval Low Complexity: 1 Low PT Treatments $Therapeutic Activity: 8-22 mins PT General Charges $$ ACUTE PT VISIT: 1 Visit         10:52 AM, 10/05/22 Wyman Songster PT, DPT Physical Therapist at [facility]

## 2022-10-05 NOTE — TOC Transition Note (Signed)
Transition of Care [facility]) - CM/SW Discharge Note   Patient Details  Name: Daniel Crawford MRN: [id number] Date of Birth: 1955/10/07  [facility]) CM/SW Contact:  Leitha Bleak, RN Phone Number: 10/05/2022, 12:45 PM   Clinical Narrative:   Patient discharging home. PT recommending HHPT. Patient is agreeable Referral sent to Sarah with [facility]. MD aware to order.  Final next level of care: Home w Home Health Services Barriers to Discharge: Barriers Resolved   Patient Goals and CMS Choice CMS Medicare.gov Compare Post Acute Care list provided to:: Patient Choice offered to / list presented to : Patient  Discharge Placement                      Patient and family notified of of transfer: 10/05/22  Discharge Plan and Services Additional resources added to the After Visit Summary for   In-house Referral: Clinical Social Work Discharge Planning Services: CM Consult                   HH Arranged: PT   Date HH Agency Contacted: 10/05/22 Time HH Agency Contacted: 1245 Representative spoke with at [facility] Agency: Versie Starks  Social Determinants of Health (SDOH) Interventions SDOH Screenings   Food Insecurity: No Food Insecurity (10/02/2022)  Housing: Low Risk  (10/02/2022)  Transportation Needs: No Transportation Needs (10/02/2022)  Utilities: Not At Risk (10/02/2022)  Depression (PHQ2-9): Medium Risk (10/27/2019)  Financial Resource Strain: Low Risk  (03/28/2021)   Received from [facility]  Tobacco Use: Medium Risk (10/02/2022)     Readmission Risk Interventions    10/04/2022    9:36 AM 07/11/2022   12:24 PM 07/05/2022   12:16 PM  Readmission Risk Prevention Plan  Transportation Screening Complete Complete Complete  Medication Review Oceanographer) Complete Complete Complete  HRI or Home Care Consult Complete Complete Complete  SW Recovery Care/Counseling Consult Complete Complete Complete  Palliative Care Screening Not Applicable -- Not Applicable  Skilled  Nursing Facility Not Applicable Not Applicable Not Applicable

## 2022-10-05 NOTE — Plan of Care (Signed)
  Problem: Acute Rehab PT Goals(only PT should resolve) Goal: Pt Will Go Supine/Side To Sit Outcome: Progressing Flowsheets (Taken 10/05/2022 1053) Pt will go Supine/Side to Sit: with modified independence Goal: Pt Will Go Sit To Supine/Side Outcome: Progressing Flowsheets (Taken 10/05/2022 1053) Pt will go Sit to Supine/Side: with modified independence Goal: Patient Will Transfer Sit To/From Stand Outcome: Progressing Flowsheets (Taken 10/05/2022 1053) Patient will transfer sit to/from stand:  with supervision  with min guard assist Goal: Pt Will Transfer Bed To Chair/Chair To Bed Outcome: Progressing Flowsheets (Taken 10/05/2022 1053) Pt will Transfer Bed to Chair/Chair to Bed:  with supervision  min guard assist Goal: Pt Will Ambulate Outcome: Progressing Flowsheets (Taken 10/05/2022 1053) Pt will Ambulate:  > 125 feet  with modified independence  with least restrictive assistive device Goal: Pt/caregiver will Perform Home Exercise Program Outcome: Progressing Flowsheets (Taken 10/05/2022 1053) Pt/caregiver will Perform Home Exercise Program:  For increased strengthening  For improved balance  Independently  10:54 AM, 10/05/22 Wyman Songster PT, DPT Physical Therapist at [facility]

## 2022-10-08 DIAGNOSIS — I509 Heart failure, unspecified: Secondary | ICD-10-CM | POA: Diagnosis not present

## 2022-10-08 DIAGNOSIS — N1831 Chronic kidney disease, stage 3a: Secondary | ICD-10-CM | POA: Diagnosis not present

## 2022-10-08 DIAGNOSIS — D509 Iron deficiency anemia, unspecified: Secondary | ICD-10-CM | POA: Diagnosis not present

## 2022-10-08 DIAGNOSIS — E039 Hypothyroidism, unspecified: Secondary | ICD-10-CM | POA: Diagnosis not present

## 2022-10-08 DIAGNOSIS — E46 Unspecified protein-calorie malnutrition: Secondary | ICD-10-CM | POA: Diagnosis not present

## 2022-10-08 DIAGNOSIS — K7682 Hepatic encephalopathy: Secondary | ICD-10-CM | POA: Diagnosis not present

## 2022-10-08 DIAGNOSIS — L03116 Cellulitis of left lower limb: Secondary | ICD-10-CM | POA: Diagnosis not present

## 2022-10-08 DIAGNOSIS — K7469 Other cirrhosis of liver: Secondary | ICD-10-CM | POA: Diagnosis not present

## 2022-10-08 DIAGNOSIS — Z6841 Body Mass Index (BMI) 40.0 and over, adult: Secondary | ICD-10-CM | POA: Diagnosis not present

## 2022-10-08 DIAGNOSIS — K7290 Hepatic failure, unspecified without coma: Secondary | ICD-10-CM | POA: Diagnosis not present

## 2022-10-08 DIAGNOSIS — I130 Hypertensive heart and chronic kidney disease with heart failure and stage 1 through stage 4 chronic kidney disease, or unspecified chronic kidney disease: Secondary | ICD-10-CM | POA: Diagnosis not present

## 2022-10-08 DIAGNOSIS — I5032 Chronic diastolic (congestive) heart failure: Secondary | ICD-10-CM | POA: Diagnosis not present

## 2022-10-08 DIAGNOSIS — E876 Hypokalemia: Secondary | ICD-10-CM | POA: Diagnosis not present

## 2022-10-08 DIAGNOSIS — F418 Other specified anxiety disorders: Secondary | ICD-10-CM | POA: Diagnosis not present

## 2022-10-08 DIAGNOSIS — J45909 Unspecified asthma, uncomplicated: Secondary | ICD-10-CM | POA: Diagnosis not present

## 2022-10-08 DIAGNOSIS — D61818 Other pancytopenia: Secondary | ICD-10-CM | POA: Diagnosis not present

## 2022-10-08 DIAGNOSIS — J9621 Acute and chronic respiratory failure with hypoxia: Secondary | ICD-10-CM | POA: Diagnosis not present

## 2022-10-08 DIAGNOSIS — E1122 Type 2 diabetes mellitus with diabetic chronic kidney disease: Secondary | ICD-10-CM | POA: Diagnosis not present

## 2022-10-08 DIAGNOSIS — D631 Anemia in chronic kidney disease: Secondary | ICD-10-CM | POA: Diagnosis not present

## 2022-10-10 ENCOUNTER — Telehealth: Payer: Self-pay | Admitting: *Deleted

## 2022-10-10 ENCOUNTER — Ambulatory Visit (INDEPENDENT_AMBULATORY_CARE_PROVIDER_SITE_OTHER): Payer: Medicare HMO | Admitting: Pulmonary Disease

## 2022-10-10 ENCOUNTER — Encounter: Payer: Self-pay | Admitting: Pulmonary Disease

## 2022-10-10 VITALS — BP 121/73 | HR 100 | Ht 73.0 in | Wt 398.0 lb

## 2022-10-10 DIAGNOSIS — K7290 Hepatic failure, unspecified without coma: Secondary | ICD-10-CM | POA: Diagnosis not present

## 2022-10-10 DIAGNOSIS — J45909 Unspecified asthma, uncomplicated: Secondary | ICD-10-CM | POA: Diagnosis not present

## 2022-10-10 DIAGNOSIS — E46 Unspecified protein-calorie malnutrition: Secondary | ICD-10-CM | POA: Diagnosis not present

## 2022-10-10 DIAGNOSIS — I5032 Chronic diastolic (congestive) heart failure: Secondary | ICD-10-CM | POA: Diagnosis not present

## 2022-10-10 DIAGNOSIS — N1831 Chronic kidney disease, stage 3a: Secondary | ICD-10-CM | POA: Diagnosis not present

## 2022-10-10 DIAGNOSIS — G4733 Obstructive sleep apnea (adult) (pediatric): Secondary | ICD-10-CM

## 2022-10-10 DIAGNOSIS — D61818 Other pancytopenia: Secondary | ICD-10-CM | POA: Diagnosis not present

## 2022-10-10 DIAGNOSIS — E1122 Type 2 diabetes mellitus with diabetic chronic kidney disease: Secondary | ICD-10-CM | POA: Diagnosis not present

## 2022-10-10 DIAGNOSIS — E039 Hypothyroidism, unspecified: Secondary | ICD-10-CM | POA: Diagnosis not present

## 2022-10-10 DIAGNOSIS — D631 Anemia in chronic kidney disease: Secondary | ICD-10-CM | POA: Diagnosis not present

## 2022-10-10 DIAGNOSIS — F418 Other specified anxiety disorders: Secondary | ICD-10-CM | POA: Diagnosis not present

## 2022-10-10 DIAGNOSIS — K7682 Hepatic encephalopathy: Secondary | ICD-10-CM | POA: Diagnosis not present

## 2022-10-10 DIAGNOSIS — D509 Iron deficiency anemia, unspecified: Secondary | ICD-10-CM | POA: Diagnosis not present

## 2022-10-10 DIAGNOSIS — K7469 Other cirrhosis of liver: Secondary | ICD-10-CM | POA: Diagnosis not present

## 2022-10-10 DIAGNOSIS — Z6841 Body Mass Index (BMI) 40.0 and over, adult: Secondary | ICD-10-CM | POA: Diagnosis not present

## 2022-10-10 DIAGNOSIS — J9621 Acute and chronic respiratory failure with hypoxia: Secondary | ICD-10-CM | POA: Diagnosis not present

## 2022-10-10 DIAGNOSIS — E876 Hypokalemia: Secondary | ICD-10-CM | POA: Diagnosis not present

## 2022-10-10 DIAGNOSIS — I130 Hypertensive heart and chronic kidney disease with heart failure and stage 1 through stage 4 chronic kidney disease, or unspecified chronic kidney disease: Secondary | ICD-10-CM | POA: Diagnosis not present

## 2022-10-10 NOTE — Telephone Encounter (Signed)
 Documented in error.

## 2022-10-10 NOTE — Patient Instructions (Signed)
  X Split night study - will need a hospital bed  You will be a t moderate risk for endoscopy procedure

## 2022-10-10 NOTE — Assessment & Plan Note (Signed)
High pretest probability of OSA.  He is willing to proceed with evaluation.  He would not be able to use home test equipment he admits.  Also he has several comorbidities and possibly obesity hypoventilation.  We will schedule an attended polysomnogram as a split study.  He will need a hospital bed to be supplied   The pathophysiology of obstructive sleep apnea , it's cardiovascular consequences & modes of treatment including CPAP were discused with the patient in detail & they evidenced understanding.

## 2022-10-10 NOTE — Assessment & Plan Note (Signed)
Increasing edema Daniel Crawford be a combination of cirrhosis and chronic diastolic heart failure.  He is on diuretics-torsemide and Aldactone and reports compliance.  Not clear whether he is taking metolazone Last BUN/creatinine was 36/1.7

## 2022-10-10 NOTE — Progress Notes (Signed)
   Subjective:    Patient ID: Daniel Crawford, male    DOB: [birth year], 67 y.o.   MRN: [id number]  HPI  67 yo  ex-smoker for follow-up of dyspnea He smoked 20 pack years before quitting in 1998 Some reversibility on PFTs and subjective improvement with albuterol   PMH :  chr diastolic CHF,  NASH liver cirrhosis- History of hepatic encephalopathy and thrombocytopenia.   hypertension,  OSA,  diabetes mellitus.  CKD stage 3a   Last seen 2022  he is due for EGD for variceal screening and needs repeat Colonoscopy for cecal polyp that was not able to be removed last April.  He is referred for pulmonary clearance  He has gained weight from 360 to 398 pounds. He was admitted 7/9 for abdominal pain, lipase was negative CT abdomen was negative.  He reports increased bipedal edema in spite of taking torsemide and spironolactone.  Medication review also shows metolazone but not clear that he is taking this.  He sleeps in a recliner, he was able to trial CPAP during his hospitalization and did feel better Oxygen saturation is 93% today He reports excessive daytime somnolence, loud snoring has been noted by other observers   Significant tests/ events reviewed CT chest wo con 01/2020 >> no nodules, no ILD   PFTs 05/2020 >> moderate restriction, ratio 73, FEV1 57%, FVC 59% but improved by 12% with bronchodilator, DLCO 70% Echo 08/2020 hyperdynamic LV, EF 70%, mild LVH   Review of Systems neg for any significant sore throat, dysphagia, itching, sneezing, nasal congestion or excess/ purulent secretions, fever, chills, sweats, unintended wt loss, pleuritic or exertional cp, hempoptysis, orthopnea pnd or change in chronic leg swelling. Also denies presyncope, palpitations, heartburn, abdominal pain, nausea, vomiting, diarrhea or change in bowel or urinary habits, dysuria,hematuria, rash, arthralgias, visual complaints, headache, numbness weakness or ataxia.     Objective:   Physical Exam  Gen. Pleasant,  obese, in no distress, normal affect ENT - no pallor,icterus, no post nasal drip, class 2 airway Neck: No JVD, no thyromegaly, no carotid bruits Lungs: no use of accessory muscles, no dullness to percussion, decreased without rales or rhonchi  Cardiovascular: Rhythm regular, heart sounds  normal, no murmurs or gallops, no peripheral edema Abdomen: soft and non-tender, no hepatosplenomegaly, BS normal. Musculoskeletal: No deformities, no cyanosis or clubbing Neuro:  alert, non focal, no tremors       Assessment & Plan:   Pulmonary clearance -he would be at moderate to high risk for respiratory failure given his morbid obesity and comorbidities.  Procedure can be performed after risk-benefit discussion if felt necessary.

## 2022-10-11 DIAGNOSIS — D61818 Other pancytopenia: Secondary | ICD-10-CM | POA: Diagnosis not present

## 2022-10-11 DIAGNOSIS — N1831 Chronic kidney disease, stage 3a: Secondary | ICD-10-CM | POA: Diagnosis not present

## 2022-10-11 DIAGNOSIS — J9621 Acute and chronic respiratory failure with hypoxia: Secondary | ICD-10-CM | POA: Diagnosis not present

## 2022-10-11 DIAGNOSIS — E876 Hypokalemia: Secondary | ICD-10-CM | POA: Diagnosis not present

## 2022-10-11 DIAGNOSIS — E1122 Type 2 diabetes mellitus with diabetic chronic kidney disease: Secondary | ICD-10-CM | POA: Diagnosis not present

## 2022-10-11 DIAGNOSIS — E039 Hypothyroidism, unspecified: Secondary | ICD-10-CM | POA: Diagnosis not present

## 2022-10-11 DIAGNOSIS — K7682 Hepatic encephalopathy: Secondary | ICD-10-CM | POA: Diagnosis not present

## 2022-10-11 DIAGNOSIS — K7469 Other cirrhosis of liver: Secondary | ICD-10-CM | POA: Diagnosis not present

## 2022-10-11 DIAGNOSIS — I130 Hypertensive heart and chronic kidney disease with heart failure and stage 1 through stage 4 chronic kidney disease, or unspecified chronic kidney disease: Secondary | ICD-10-CM | POA: Diagnosis not present

## 2022-10-11 DIAGNOSIS — Z6841 Body Mass Index (BMI) 40.0 and over, adult: Secondary | ICD-10-CM | POA: Diagnosis not present

## 2022-10-11 DIAGNOSIS — E46 Unspecified protein-calorie malnutrition: Secondary | ICD-10-CM | POA: Diagnosis not present

## 2022-10-11 DIAGNOSIS — I5032 Chronic diastolic (congestive) heart failure: Secondary | ICD-10-CM | POA: Diagnosis not present

## 2022-10-11 DIAGNOSIS — F418 Other specified anxiety disorders: Secondary | ICD-10-CM | POA: Diagnosis not present

## 2022-10-11 DIAGNOSIS — J45909 Unspecified asthma, uncomplicated: Secondary | ICD-10-CM | POA: Diagnosis not present

## 2022-10-11 DIAGNOSIS — D631 Anemia in chronic kidney disease: Secondary | ICD-10-CM | POA: Diagnosis not present

## 2022-10-11 DIAGNOSIS — D509 Iron deficiency anemia, unspecified: Secondary | ICD-10-CM | POA: Diagnosis not present

## 2022-10-11 DIAGNOSIS — K7290 Hepatic failure, unspecified without coma: Secondary | ICD-10-CM | POA: Diagnosis not present

## 2022-10-12 ENCOUNTER — Telehealth (INDEPENDENT_AMBULATORY_CARE_PROVIDER_SITE_OTHER): Payer: Self-pay

## 2022-10-12 DIAGNOSIS — K7290 Hepatic failure, unspecified without coma: Secondary | ICD-10-CM | POA: Diagnosis not present

## 2022-10-12 DIAGNOSIS — F418 Other specified anxiety disorders: Secondary | ICD-10-CM | POA: Diagnosis not present

## 2022-10-12 DIAGNOSIS — E1122 Type 2 diabetes mellitus with diabetic chronic kidney disease: Secondary | ICD-10-CM | POA: Diagnosis not present

## 2022-10-12 DIAGNOSIS — J9621 Acute and chronic respiratory failure with hypoxia: Secondary | ICD-10-CM | POA: Diagnosis not present

## 2022-10-12 DIAGNOSIS — D509 Iron deficiency anemia, unspecified: Secondary | ICD-10-CM | POA: Diagnosis not present

## 2022-10-12 DIAGNOSIS — K7682 Hepatic encephalopathy: Secondary | ICD-10-CM | POA: Diagnosis not present

## 2022-10-12 DIAGNOSIS — N1831 Chronic kidney disease, stage 3a: Secondary | ICD-10-CM | POA: Diagnosis not present

## 2022-10-12 DIAGNOSIS — E46 Unspecified protein-calorie malnutrition: Secondary | ICD-10-CM | POA: Diagnosis not present

## 2022-10-12 DIAGNOSIS — E039 Hypothyroidism, unspecified: Secondary | ICD-10-CM | POA: Diagnosis not present

## 2022-10-12 DIAGNOSIS — K7469 Other cirrhosis of liver: Secondary | ICD-10-CM | POA: Diagnosis not present

## 2022-10-12 DIAGNOSIS — D631 Anemia in chronic kidney disease: Secondary | ICD-10-CM | POA: Diagnosis not present

## 2022-10-12 DIAGNOSIS — I130 Hypertensive heart and chronic kidney disease with heart failure and stage 1 through stage 4 chronic kidney disease, or unspecified chronic kidney disease: Secondary | ICD-10-CM | POA: Diagnosis not present

## 2022-10-12 DIAGNOSIS — J45909 Unspecified asthma, uncomplicated: Secondary | ICD-10-CM | POA: Diagnosis not present

## 2022-10-12 DIAGNOSIS — D61818 Other pancytopenia: Secondary | ICD-10-CM | POA: Diagnosis not present

## 2022-10-12 DIAGNOSIS — Z6841 Body Mass Index (BMI) 40.0 and over, adult: Secondary | ICD-10-CM | POA: Diagnosis not present

## 2022-10-12 DIAGNOSIS — I5032 Chronic diastolic (congestive) heart failure: Secondary | ICD-10-CM | POA: Diagnosis not present

## 2022-10-12 DIAGNOSIS — E876 Hypokalemia: Secondary | ICD-10-CM | POA: Diagnosis not present

## 2022-10-15 DIAGNOSIS — K7290 Hepatic failure, unspecified without coma: Secondary | ICD-10-CM | POA: Diagnosis not present

## 2022-10-15 DIAGNOSIS — D509 Iron deficiency anemia, unspecified: Secondary | ICD-10-CM | POA: Diagnosis not present

## 2022-10-15 DIAGNOSIS — J9621 Acute and chronic respiratory failure with hypoxia: Secondary | ICD-10-CM | POA: Diagnosis not present

## 2022-10-15 DIAGNOSIS — I5032 Chronic diastolic (congestive) heart failure: Secondary | ICD-10-CM | POA: Diagnosis not present

## 2022-10-15 DIAGNOSIS — D631 Anemia in chronic kidney disease: Secondary | ICD-10-CM | POA: Diagnosis not present

## 2022-10-15 DIAGNOSIS — E039 Hypothyroidism, unspecified: Secondary | ICD-10-CM | POA: Diagnosis not present

## 2022-10-15 DIAGNOSIS — D61818 Other pancytopenia: Secondary | ICD-10-CM | POA: Diagnosis not present

## 2022-10-15 DIAGNOSIS — J45909 Unspecified asthma, uncomplicated: Secondary | ICD-10-CM | POA: Diagnosis not present

## 2022-10-15 DIAGNOSIS — N1831 Chronic kidney disease, stage 3a: Secondary | ICD-10-CM | POA: Diagnosis not present

## 2022-10-15 DIAGNOSIS — K7469 Other cirrhosis of liver: Secondary | ICD-10-CM | POA: Diagnosis not present

## 2022-10-15 DIAGNOSIS — I130 Hypertensive heart and chronic kidney disease with heart failure and stage 1 through stage 4 chronic kidney disease, or unspecified chronic kidney disease: Secondary | ICD-10-CM | POA: Diagnosis not present

## 2022-10-15 DIAGNOSIS — E1122 Type 2 diabetes mellitus with diabetic chronic kidney disease: Secondary | ICD-10-CM | POA: Diagnosis not present

## 2022-10-15 DIAGNOSIS — E46 Unspecified protein-calorie malnutrition: Secondary | ICD-10-CM | POA: Diagnosis not present

## 2022-10-15 DIAGNOSIS — E876 Hypokalemia: Secondary | ICD-10-CM | POA: Diagnosis not present

## 2022-10-15 DIAGNOSIS — K7682 Hepatic encephalopathy: Secondary | ICD-10-CM | POA: Diagnosis not present

## 2022-10-15 DIAGNOSIS — Z6841 Body Mass Index (BMI) 40.0 and over, adult: Secondary | ICD-10-CM | POA: Diagnosis not present

## 2022-10-15 DIAGNOSIS — F418 Other specified anxiety disorders: Secondary | ICD-10-CM | POA: Diagnosis not present

## 2022-10-16 DIAGNOSIS — J45909 Unspecified asthma, uncomplicated: Secondary | ICD-10-CM | POA: Diagnosis not present

## 2022-10-16 DIAGNOSIS — I5032 Chronic diastolic (congestive) heart failure: Secondary | ICD-10-CM | POA: Diagnosis not present

## 2022-10-16 DIAGNOSIS — Z713 Dietary counseling and surveillance: Secondary | ICD-10-CM | POA: Diagnosis not present

## 2022-10-16 DIAGNOSIS — E1122 Type 2 diabetes mellitus with diabetic chronic kidney disease: Secondary | ICD-10-CM | POA: Diagnosis not present

## 2022-10-16 DIAGNOSIS — I130 Hypertensive heart and chronic kidney disease with heart failure and stage 1 through stage 4 chronic kidney disease, or unspecified chronic kidney disease: Secondary | ICD-10-CM | POA: Diagnosis not present

## 2022-10-16 DIAGNOSIS — E039 Hypothyroidism, unspecified: Secondary | ICD-10-CM | POA: Diagnosis not present

## 2022-10-16 DIAGNOSIS — Z6841 Body Mass Index (BMI) 40.0 and over, adult: Secondary | ICD-10-CM | POA: Diagnosis not present

## 2022-10-16 DIAGNOSIS — K7460 Unspecified cirrhosis of liver: Secondary | ICD-10-CM | POA: Diagnosis not present

## 2022-10-16 DIAGNOSIS — R609 Edema, unspecified: Secondary | ICD-10-CM | POA: Diagnosis not present

## 2022-10-16 DIAGNOSIS — D61818 Other pancytopenia: Secondary | ICD-10-CM | POA: Diagnosis not present

## 2022-10-16 DIAGNOSIS — K7290 Hepatic failure, unspecified without coma: Secondary | ICD-10-CM | POA: Diagnosis not present

## 2022-10-16 DIAGNOSIS — N1831 Chronic kidney disease, stage 3a: Secondary | ICD-10-CM | POA: Diagnosis not present

## 2022-10-16 DIAGNOSIS — E876 Hypokalemia: Secondary | ICD-10-CM | POA: Diagnosis not present

## 2022-10-16 DIAGNOSIS — K7469 Other cirrhosis of liver: Secondary | ICD-10-CM | POA: Diagnosis not present

## 2022-10-16 DIAGNOSIS — Z09 Encounter for follow-up examination after completed treatment for conditions other than malignant neoplasm: Secondary | ICD-10-CM | POA: Diagnosis not present

## 2022-10-16 DIAGNOSIS — E46 Unspecified protein-calorie malnutrition: Secondary | ICD-10-CM | POA: Diagnosis not present

## 2022-10-16 DIAGNOSIS — K7682 Hepatic encephalopathy: Secondary | ICD-10-CM | POA: Diagnosis not present

## 2022-10-16 DIAGNOSIS — D509 Iron deficiency anemia, unspecified: Secondary | ICD-10-CM | POA: Diagnosis not present

## 2022-10-16 DIAGNOSIS — D631 Anemia in chronic kidney disease: Secondary | ICD-10-CM | POA: Diagnosis not present

## 2022-10-16 DIAGNOSIS — F418 Other specified anxiety disorders: Secondary | ICD-10-CM | POA: Diagnosis not present

## 2022-10-16 DIAGNOSIS — J9621 Acute and chronic respiratory failure with hypoxia: Secondary | ICD-10-CM | POA: Diagnosis not present

## 2022-10-17 DIAGNOSIS — N1831 Chronic kidney disease, stage 3a: Secondary | ICD-10-CM | POA: Diagnosis not present

## 2022-10-17 DIAGNOSIS — J45909 Unspecified asthma, uncomplicated: Secondary | ICD-10-CM | POA: Diagnosis not present

## 2022-10-17 DIAGNOSIS — D509 Iron deficiency anemia, unspecified: Secondary | ICD-10-CM | POA: Diagnosis not present

## 2022-10-17 DIAGNOSIS — F418 Other specified anxiety disorders: Secondary | ICD-10-CM | POA: Diagnosis not present

## 2022-10-17 DIAGNOSIS — I5032 Chronic diastolic (congestive) heart failure: Secondary | ICD-10-CM | POA: Diagnosis not present

## 2022-10-17 DIAGNOSIS — K7682 Hepatic encephalopathy: Secondary | ICD-10-CM | POA: Diagnosis not present

## 2022-10-17 DIAGNOSIS — K7469 Other cirrhosis of liver: Secondary | ICD-10-CM | POA: Diagnosis not present

## 2022-10-17 DIAGNOSIS — Z6841 Body Mass Index (BMI) 40.0 and over, adult: Secondary | ICD-10-CM | POA: Diagnosis not present

## 2022-10-17 DIAGNOSIS — I130 Hypertensive heart and chronic kidney disease with heart failure and stage 1 through stage 4 chronic kidney disease, or unspecified chronic kidney disease: Secondary | ICD-10-CM | POA: Diagnosis not present

## 2022-10-17 DIAGNOSIS — K7290 Hepatic failure, unspecified without coma: Secondary | ICD-10-CM | POA: Diagnosis not present

## 2022-10-17 DIAGNOSIS — E1122 Type 2 diabetes mellitus with diabetic chronic kidney disease: Secondary | ICD-10-CM | POA: Diagnosis not present

## 2022-10-17 DIAGNOSIS — E039 Hypothyroidism, unspecified: Secondary | ICD-10-CM | POA: Diagnosis not present

## 2022-10-17 DIAGNOSIS — J9621 Acute and chronic respiratory failure with hypoxia: Secondary | ICD-10-CM | POA: Diagnosis not present

## 2022-10-17 DIAGNOSIS — E46 Unspecified protein-calorie malnutrition: Secondary | ICD-10-CM | POA: Diagnosis not present

## 2022-10-17 DIAGNOSIS — D631 Anemia in chronic kidney disease: Secondary | ICD-10-CM | POA: Diagnosis not present

## 2022-10-17 DIAGNOSIS — E876 Hypokalemia: Secondary | ICD-10-CM | POA: Diagnosis not present

## 2022-10-17 DIAGNOSIS — D61818 Other pancytopenia: Secondary | ICD-10-CM | POA: Diagnosis not present

## 2022-10-18 ENCOUNTER — Encounter: Payer: Self-pay | Admitting: Gastroenterology

## 2022-10-18 ENCOUNTER — Ambulatory Visit: Payer: Medicare HMO | Admitting: Gastroenterology

## 2022-10-18 VITALS — BP 110/71 | HR 98 | Temp 98.6°F | Ht 74.0 in | Wt >= 6400 oz

## 2022-10-18 DIAGNOSIS — K7460 Unspecified cirrhosis of liver: Secondary | ICD-10-CM

## 2022-10-18 DIAGNOSIS — K7581 Nonalcoholic steatohepatitis (NASH): Secondary | ICD-10-CM

## 2022-10-18 NOTE — Patient Instructions (Signed)
I am trying to get the paracentesis ordered as soon as possible.  On the day you have this done, please complete labs.  Please go to the emergency room if any worsening of symptoms.  We will see you in 3 months!   I enjoyed seeing you again today! I value our relationship and want to provide genuine, compassionate, and quality care. You Ruggerio receive a survey regarding your visit with me, and I welcome your feedback! Thanks so much for taking the time to complete this. I look forward to seeing you again.      Gelene Mink, PhD, ANP-BC [facility]

## 2022-10-18 NOTE — Progress Notes (Signed)
Gastroenterology Office Note     Primary Care Physician:  [facility]  Primary Gastroenterologist: Dr. Levon Hedger   Chief Complaint   Chief Complaint  Patient presents with   Follow-up    Follow up after procedure     History of Present Illness   Daniel Crawford is a 67 y.o. male presenting today with history of MASH cirrhosis c/b recurrent anasarca, HE, asthma, anxiety, CHF, CKD, depression, diabetes, hypertension, hypothyroidism, not a liver transplant candidate.   Ultimately, he is due for EGD for variceal screening and early interval colonoscopy as cecal polyp note removed last April. Needs clearance from both pulmonary and cardioogy. However, pulmonary has noted him to be moderate to high risk.   Rcently inpatient at [facility] with diffuse abdominal pain, acute respiratory failure in setting of HF exacerbation,   60-80 mg torsemide  in mornings and afternoon, spironlactone 100 mg daily. Not taking metalozone as this over-diuresed him and he is concerned about taking this. No energy. Just wants to sleep. Takes Xifaxan BID. Titrating lactulose and having 3-4 BMs per day.   States he was placed on Hospice but decided he didn't want to do this as no further blood work or interventions. Getting back into palliative now and called this week. No mental status changes or confusion. No overt GI bleeding. Drinking one boost each evening. Feels his abdomen is more distended. He has gained significant weight since last seen.    Cirrhosis related questions: Hematemesis/coffee ground emesis: No Abdominal pain: No Abdominal distention/worsening ascites: anasarca Fever/chills: No Episodes of confusion/disorientation: Yes, currently on lactulose and rifaximin 550 mg twice a day Number of daily bowel movements: 3-4 a day  Taking diuretics?:  60-80 mg torsemide BID, aldactone 100 mg daily  History of variceal bleeding: No Prior history of banding?: No Prior episodes  of SBP: No Last time liver imaging was performed: June and July, no mass Last AFP: 05/17/22 - 9.0 Currently consuming alcohol: No   Last EGD: 11/06/2019 - Normal esophagus. Dilated. - Portal hypertensive gastropathy. - Normal examined duodenum. - No specimens collected.   Last Colonoscopy:06/28/2021  - One medium size sessile polyp in the cecum. This polyp could not be removed because of poor approach. - Five 4 to 7 mm polyps in the transverse colon and at the hepatic flexure, removed with a cold snare. Resected and retrieved. - External and internal hemorrhoids.   Path: All polyps were tubular adenomas    Past Medical History:  Diagnosis Date   Anemia    Anxiety    Asthma    CHF (congestive heart failure) (HCC)    CKD (chronic kidney disease)    Depression    Diabetes mellitus without complication (HCC)    Dyspnea    Hypertension    Hypothyroidism    Liver cirrhosis secondary to NASH (HCC)    NASH (nonalcoholic steatohepatitis)    Pre-diabetes    Spleen enlarged    Thrombocytopenia (HCC)     Past Surgical History:  Procedure Laterality Date   Bilateral hernia surgery     2006, 2007   CHOLECYSTECTOMY     2016    COLONOSCOPY WITH PROPOFOL N/A 06/28/2021   Procedure: COLONOSCOPY WITH PROPOFOL;  Surgeon: Malissa Hippo, MD;  [facility];  Service: Endoscopy;  Laterality: N/A;  1020   ESOPHAGEAL DILATION N/A 11/06/2019   Procedure: ESOPHAGEAL DILATION;  Surgeon: Dolores Frame, MD;  Location: [facility];  Service: Gastroenterology;  Laterality:  N/A;   ESOPHAGOGASTRODUODENOSCOPY (EGD) WITH PROPOFOL N/A 11/06/2019   Procedure: ESOPHAGOGASTRODUODENOSCOPY (EGD) WITH PROPOFOL;  Surgeon: Dolores Frame, MD;  [facility];  Service: Gastroenterology;  Laterality: N/A;  1045   IR RADIOLOGIST EVAL & MGMT  02/19/2022   LAPAROSCOPIC ASSISTED VENTRAL HERNIA REPAIR     Polyp removed     in January of 2018.    POLYPECTOMY  06/28/2021    Procedure: POLYPECTOMY INTESTINAL;  Surgeon: Malissa Hippo, MD;  [facility];  Service: Endoscopy;;   RIGHT HEART CATH N/A 01/08/2022   Procedure: RIGHT HEART CATH;  Surgeon: Corky Crafts, MD;  Location: [facility] INVASIVE CV LAB;  Service: Cardiovascular;  Laterality: N/A;    Current Outpatient Medications  Medication Sig Dispense Refill   acetaminophen (TYLENOL) 500 MG tablet Take 1,000 mg by mouth every 6 (six) hours as needed for moderate pain.     albuterol (VENTOLIN HFA) 108 (90 Base) MCG/ACT inhaler Inhale 2 puffs into the lungs every 6 (six) hours as needed for wheezing or shortness of breath. 18 g 2   carvedilol (COREG) 6.25 MG tablet Take 1 tablet (6.25 mg total) by mouth 2 (two) times daily with a meal. 180 tablet 3   gabapentin (NEURONTIN) 100 MG capsule Take 100 mg by mouth daily as needed (pain).     hydrOXYzine (VISTARIL) 50 MG capsule Take 1 capsule (50 mg total) by mouth 3 (three) times daily as needed for anxiety or nausea. 90 capsule 2   lactose free nutrition (BOOST) LIQD Take 237 mLs by mouth every evening.     lactulose (CHRONULAC) 10 GM/15ML solution Take 30 mLs (20 g total) by mouth 2 (two) times daily. 946 mL 1   levothyroxine (SYNTHROID) 175 MCG tablet Take 175 mcg by mouth daily before breakfast.     metolazone (ZAROXOLYN) 5 MG tablet Take 5 mg by mouth every other day.     ondansetron (ZOFRAN) 8 MG tablet Take by mouth every 8 (eight) hours as needed for nausea or vomiting.     pantoprazole (PROTONIX) 40 MG tablet Take 1 tablet (40 mg total) by mouth daily. 90 tablet 3   potassium chloride SA (KLOR-CON M) 20 MEQ tablet Take 2 tablets (40 mEq total) by mouth 2 (two) times daily. 180 tablet 3   rifaximin (XIFAXAN) 550 MG TABS tablet Take 1 tablet (550 mg total) by mouth 2 (two) times daily. 180 tablet 3   sertraline (ZOLOFT) 100 MG tablet Take 100 mg by mouth daily.     spironolactone (ALDACTONE) 100 MG tablet Take 1 tablet (100 mg total) by mouth daily.  30 tablet 1   torsemide 60 MG TABS Take 60 mg by mouth 2 (two) times daily. 60 tablet 0   traZODone (DESYREL) 50 MG tablet Take 1 tablet (50 mg total) by mouth at bedtime. 90 tablet 3   No current facility-administered medications for this visit.    Allergies as of 10/18/2022   (No Known Allergies)    Family History  Problem Relation Age of Onset   Liver cancer Mother    Heart disease Mother    Aneurysm Sister     Social History   Socioeconomic History   Marital status: Single    Spouse name: Not on file   Number of children: Not on file   Years of education: Not on file   Highest education level: Not on file  Occupational History   Not on file  Tobacco Use  Smoking status: Former    Current packs/day: 0.00    Average packs/day: 1 pack/day for 20.0 years (20.0 ttl pk-yrs)    Types: Cigarettes    Start date: 17    Quit date: 2017    Years since quitting: 7.5    Passive exposure: Current   Smokeless tobacco: Never  Vaping Use   Vaping status: Never Used  Substance and Sexual Activity   Alcohol use: No   Drug use: No   Sexual activity: Not on file  Other Topics Concern   Not on file  Social History Narrative   Not on file   Social Determinants of Health   Financial Resource Strain: Low Risk  (03/28/2021)   [facility] from [facility]   Overall Financial Resource Strain (CARDIA)    Difficulty of Paying Living Expenses: Not hard at all  Food Insecurity: No Food Insecurity (10/02/2022)   Hunger Vital Sign    Worried About Running Out of Food in the Last Year: Never true    Ran Out of Food in the Last Year: Never true  Transportation Needs: No Transportation Needs (10/02/2022)   PRAPARE - Administrator, Civil Service (Medical): No    Lack of Transportation (Non-Medical): No  Physical Activity: Not on file  Stress: Not on file  Social Connections: Not on file  Intimate Partner Violence: Not At Risk (10/02/2022)   Humiliation,  Afraid, Rape, and Kick questionnaire    Fear of Current or Ex-Partner: No    Emotionally Abused: No    Physically Abused: No    Sexually Abused: No     Review of Systems   Gen: Denies any fever, chills, fatigue, weight loss, lack of appetite.  CV: Denies chest pain, heart palpitations, peripheral edema, syncope.  Resp: Denies shortness of breath at rest or with exertion. Denies wheezing or cough.  GI: Denies dysphagia or odynophagia. Denies jaundice, hematemesis, fecal incontinence. GU : Denies urinary burning, urinary frequency, urinary hesitancy MS: Denies joint pain, muscle weakness, cramps, or limitation of movement.  Derm: Denies rash, itching, dry skin Psych: Denies depression, anxiety, memory loss, and confusion Heme: Denies bruising, bleeding, and enlarged lymph nodes.   Physical Exam   BP 110/71   Pulse 98   Temp 98.6 F (37 C)   Ht 6\' 2"  (1.88 m)   Wt (!) 402 lb 14.4 oz (182.8 kg)   BMI 51.73 kg/m  General:   Alert and oriented. Pleasant and cooperative. Well-nourished and well-developed.  Head:  Normocephalic and atraumatic. Eyes:  Without icterus Abdomen:  +BS, protruding umbilical hernia, tense, +anasarca Rectal:  Deferred  Msk:  Symmetrical without gross deformities. Normal posture. Extremities:  3+ pitting edema to thigh Neurologic:  Alert and  oriented x4;  grossly normal neurologically. Skin:  Intact without significant lesions or rashes. Psych:  Alert and cooperative. Normal mood and affect.   Assessment   Daniel Crawford is a 67 y.o. male presenting today in follow-up with a history of MASH cirrhosis c/b recurrent anasarca, HE, asthma, anxiety, CHF, CKD, depression, diabetes, hypertension, hypothyroidism, not a liver transplant candidate.   Returning today with worsening anasarca and possible ascites but difficult to ascertain if this is truly ascites in light of large AP diameter. We will order a para if fluid is present along with updated MELD labs.  Diuretic regimen difficult to manage in light of HF and is predominantly managed by cardiology.   He is not a candidate for EGD  or colonoscopy, although due for both, as he is high risk for anesthesia. He would be best served with continuing with palliative as outpatient and likely hospice in near future. We will continue to provide supportive measures as able.      PLAN    [place] with para if ascites present: cytology and cell count. 25 g IV Albumin at start.  CBC, CMP, INR, AFP To ED if any worsening of symptoms Follow-up in 3 months or sooner if needed  Gelene Mink, PhD, ANP-BC [facility]

## 2022-10-19 ENCOUNTER — Other Ambulatory Visit: Payer: Self-pay

## 2022-10-19 ENCOUNTER — Emergency Department (HOSPITAL_COMMUNITY): Payer: Medicare HMO

## 2022-10-19 ENCOUNTER — Ambulatory Visit (HOSPITAL_COMMUNITY)
Admission: RE | Admit: 2022-10-19 | Discharge: 2022-10-19 | Disposition: A | Discharge status: Home / Self Care | Payer: Medicare HMO | Source: Ambulatory Visit | Attending: Gastroenterology | Admitting: Gastroenterology

## 2022-10-19 ENCOUNTER — Encounter (HOSPITAL_COMMUNITY): Payer: Self-pay | Admitting: Emergency Medicine

## 2022-10-19 ENCOUNTER — Other Ambulatory Visit (HOSPITAL_COMMUNITY)
Admission: RE | Admit: 2022-10-19 | Discharge: 2022-10-19 | Disposition: A | Discharge status: Home / Self Care | Payer: Medicare HMO | Source: Ambulatory Visit | Attending: Gastroenterology | Admitting: Gastroenterology

## 2022-10-19 ENCOUNTER — Inpatient Hospital Stay (HOSPITAL_COMMUNITY)
Admission: EM | Admit: 2022-10-19 | Discharge: 2022-10-26 | DRG: 433 | Disposition: A | Discharge status: Home / Self Care | Payer: Medicare HMO | Attending: Internal Medicine | Admitting: Internal Medicine

## 2022-10-19 DIAGNOSIS — I130 Hypertensive heart and chronic kidney disease with heart failure and stage 1 through stage 4 chronic kidney disease, or unspecified chronic kidney disease: Secondary | ICD-10-CM | POA: Diagnosis present

## 2022-10-19 DIAGNOSIS — K567 Ileus, unspecified: Secondary | ICD-10-CM | POA: Diagnosis not present

## 2022-10-19 DIAGNOSIS — E8770 Fluid overload, unspecified: Secondary | ICD-10-CM | POA: Diagnosis not present

## 2022-10-19 DIAGNOSIS — K766 Portal hypertension: Secondary | ICD-10-CM | POA: Diagnosis present

## 2022-10-19 DIAGNOSIS — R252 Cramp and spasm: Secondary | ICD-10-CM | POA: Diagnosis not present

## 2022-10-19 DIAGNOSIS — R404 Transient alteration of awareness: Secondary | ICD-10-CM | POA: Diagnosis not present

## 2022-10-19 DIAGNOSIS — K7290 Hepatic failure, unspecified without coma: Secondary | ICD-10-CM | POA: Diagnosis present

## 2022-10-19 DIAGNOSIS — E1122 Type 2 diabetes mellitus with diabetic chronic kidney disease: Secondary | ICD-10-CM | POA: Diagnosis present

## 2022-10-19 DIAGNOSIS — K529 Noninfective gastroenteritis and colitis, unspecified: Secondary | ICD-10-CM | POA: Diagnosis not present

## 2022-10-19 DIAGNOSIS — R600 Localized edema: Secondary | ICD-10-CM | POA: Diagnosis not present

## 2022-10-19 DIAGNOSIS — Z800 Family history of malignant neoplasm of digestive organs: Secondary | ICD-10-CM

## 2022-10-19 DIAGNOSIS — N183 Chronic kidney disease, stage 3 unspecified: Secondary | ICD-10-CM | POA: Diagnosis present

## 2022-10-19 DIAGNOSIS — L03116 Cellulitis of left lower limb: Secondary | ICD-10-CM | POA: Diagnosis not present

## 2022-10-19 DIAGNOSIS — Z6841 Body Mass Index (BMI) 40.0 and over, adult: Secondary | ICD-10-CM | POA: Diagnosis not present

## 2022-10-19 DIAGNOSIS — E8809 Other disorders of plasma-protein metabolism, not elsewhere classified: Secondary | ICD-10-CM | POA: Diagnosis not present

## 2022-10-19 DIAGNOSIS — K7460 Unspecified cirrhosis of liver: Secondary | ICD-10-CM | POA: Diagnosis present

## 2022-10-19 DIAGNOSIS — F419 Anxiety disorder, unspecified: Secondary | ICD-10-CM | POA: Diagnosis present

## 2022-10-19 DIAGNOSIS — E871 Hypo-osmolality and hyponatremia: Secondary | ICD-10-CM

## 2022-10-19 DIAGNOSIS — D696 Thrombocytopenia, unspecified: Secondary | ICD-10-CM | POA: Diagnosis not present

## 2022-10-19 DIAGNOSIS — R188 Other ascites: Secondary | ICD-10-CM | POA: Diagnosis present

## 2022-10-19 DIAGNOSIS — Z79899 Other long term (current) drug therapy: Secondary | ICD-10-CM

## 2022-10-19 DIAGNOSIS — F32A Depression, unspecified: Secondary | ICD-10-CM | POA: Diagnosis present

## 2022-10-19 DIAGNOSIS — E119 Type 2 diabetes mellitus without complications: Secondary | ICD-10-CM

## 2022-10-19 DIAGNOSIS — I5032 Chronic diastolic (congestive) heart failure: Secondary | ICD-10-CM | POA: Diagnosis present

## 2022-10-19 DIAGNOSIS — R601 Generalized edema: Principal | ICD-10-CM | POA: Diagnosis present

## 2022-10-19 DIAGNOSIS — Z8719 Personal history of other diseases of the digestive system: Secondary | ICD-10-CM | POA: Diagnosis not present

## 2022-10-19 DIAGNOSIS — N1831 Chronic kidney disease, stage 3a: Secondary | ICD-10-CM | POA: Diagnosis not present

## 2022-10-19 DIAGNOSIS — L02416 Cutaneous abscess of left lower limb: Secondary | ICD-10-CM

## 2022-10-19 DIAGNOSIS — Z1152 Encounter for screening for COVID-19: Secondary | ICD-10-CM | POA: Diagnosis not present

## 2022-10-19 DIAGNOSIS — Z7189 Other specified counseling: Secondary | ICD-10-CM | POA: Diagnosis not present

## 2022-10-19 DIAGNOSIS — Z9049 Acquired absence of other specified parts of digestive tract: Secondary | ICD-10-CM

## 2022-10-19 DIAGNOSIS — Z515 Encounter for palliative care: Secondary | ICD-10-CM | POA: Diagnosis not present

## 2022-10-19 DIAGNOSIS — K7682 Hepatic encephalopathy: Secondary | ICD-10-CM | POA: Diagnosis not present

## 2022-10-19 DIAGNOSIS — G4733 Obstructive sleep apnea (adult) (pediatric): Secondary | ICD-10-CM | POA: Diagnosis present

## 2022-10-19 DIAGNOSIS — K7581 Nonalcoholic steatohepatitis (NASH): Secondary | ICD-10-CM | POA: Diagnosis present

## 2022-10-19 DIAGNOSIS — Z87891 Personal history of nicotine dependence: Secondary | ICD-10-CM

## 2022-10-19 DIAGNOSIS — N200 Calculus of kidney: Secondary | ICD-10-CM | POA: Diagnosis not present

## 2022-10-19 DIAGNOSIS — Z8249 Family history of ischemic heart disease and other diseases of the circulatory system: Secondary | ICD-10-CM

## 2022-10-19 DIAGNOSIS — K7210 Chronic hepatic failure without coma: Secondary | ICD-10-CM

## 2022-10-19 DIAGNOSIS — I509 Heart failure, unspecified: Secondary | ICD-10-CM

## 2022-10-19 DIAGNOSIS — Z79890 Hormone replacement therapy (postmenopausal): Secondary | ICD-10-CM

## 2022-10-19 DIAGNOSIS — D631 Anemia in chronic kidney disease: Secondary | ICD-10-CM | POA: Diagnosis not present

## 2022-10-19 DIAGNOSIS — K7469 Other cirrhosis of liver: Secondary | ICD-10-CM | POA: Diagnosis not present

## 2022-10-19 DIAGNOSIS — E66813 Obesity, class 3: Secondary | ICD-10-CM | POA: Diagnosis present

## 2022-10-19 DIAGNOSIS — I872 Venous insufficiency (chronic) (peripheral): Secondary | ICD-10-CM | POA: Diagnosis present

## 2022-10-19 DIAGNOSIS — Z713 Dietary counseling and surveillance: Secondary | ICD-10-CM

## 2022-10-19 DIAGNOSIS — I10 Essential (primary) hypertension: Secondary | ICD-10-CM | POA: Diagnosis not present

## 2022-10-19 DIAGNOSIS — E039 Hypothyroidism, unspecified: Secondary | ICD-10-CM | POA: Diagnosis present

## 2022-10-19 DIAGNOSIS — M79605 Pain in left leg: Secondary | ICD-10-CM | POA: Diagnosis not present

## 2022-10-19 DIAGNOSIS — R0602 Shortness of breath: Secondary | ICD-10-CM | POA: Diagnosis not present

## 2022-10-19 DIAGNOSIS — R161 Splenomegaly, not elsewhere classified: Secondary | ICD-10-CM | POA: Diagnosis not present

## 2022-10-19 LAB — COMPREHENSIVE METABOLIC PANEL
ALT: 23 U/L (ref 0–44)
AST: 47 U/L — ABNORMAL HIGH (ref 15–41)
Albumin: 2.5 g/dL — ABNORMAL LOW (ref 3.5–5.0)
Alkaline Phosphatase: 89 U/L (ref 38–126)
Anion gap: 8 (ref 5–15)
BUN: 19 mg/dL (ref 8–23)
CO2: 31 mmol/L (ref 22–32)
Calcium: 8.2 mg/dL — ABNORMAL LOW (ref 8.9–10.3)
Chloride: 94 mmol/L — ABNORMAL LOW (ref 98–111)
Creatinine, Ser: 1.61 mg/dL — ABNORMAL HIGH (ref 0.61–1.24)
GFR, Estimated: 47 mL/min — ABNORMAL LOW (ref >60)
Glucose, Bld: 216 mg/dL — ABNORMAL HIGH (ref 70–99)
Potassium: 3.8 mmol/L (ref 3.5–5.1)
Sodium: 133 mmol/L — ABNORMAL LOW (ref 135–145)
Total Bilirubin: 5.7 mg/dL — ABNORMAL HIGH (ref 0.3–1.2)
Total Protein: 6.1 g/dL — ABNORMAL LOW (ref 6.5–8.1)

## 2022-10-19 LAB — GLUCOSE, CAPILLARY: Glucose-Capillary: 145 mg/dL — ABNORMAL HIGH (ref 70–99)

## 2022-10-19 LAB — CBC WITH DIFFERENTIAL/PLATELET
Abs Immature Granulocytes: 0.03 10*3/uL (ref 0.00–0.07)
Basophils Absolute: 0 10*3/uL (ref 0.0–0.1)
Basophils Relative: 1 %
Eosinophils Absolute: 0.3 10*3/uL (ref 0.0–0.5)
Eosinophils Relative: 5 %
HCT: 30.8 % — ABNORMAL LOW (ref 39.0–52.0)
Hemoglobin: 10.1 g/dL — ABNORMAL LOW (ref 13.0–17.0)
Immature Granulocytes: 1 %
Lymphocytes Relative: 14 %
Lymphs Abs: 0.8 10*3/uL (ref 0.7–4.0)
MCH: 29.9 pg (ref 26.0–34.0)
MCHC: 32.8 g/dL (ref 30.0–36.0)
MCV: 91.1 fL (ref 80.0–100.0)
Monocytes Absolute: 0.5 10*3/uL (ref 0.1–1.0)
Monocytes Relative: 8 %
Neutro Abs: 4.2 10*3/uL (ref 1.7–7.7)
Neutrophils Relative %: 71 %
Platelets: 76 10*3/uL — ABNORMAL LOW (ref 150–400)
RBC: 3.38 MIL/uL — ABNORMAL LOW (ref 4.22–5.81)
RDW: 17.7 % — ABNORMAL HIGH (ref 11.5–15.5)
WBC: 5.8 10*3/uL (ref 4.0–10.5)
nRBC: 0 % (ref 0.0–0.2)

## 2022-10-19 LAB — CBC
HCT: 30.9 % — ABNORMAL LOW (ref 39.0–52.0)
Hemoglobin: 9.9 g/dL — ABNORMAL LOW (ref 13.0–17.0)
MCH: 29.4 pg (ref 26.0–34.0)
MCHC: 32 g/dL (ref 30.0–36.0)
MCV: 91.7 fL (ref 80.0–100.0)
Platelets: 76 10*3/uL — ABNORMAL LOW (ref 150–400)
RBC: 3.37 MIL/uL — ABNORMAL LOW (ref 4.22–5.81)
RDW: 17.8 % — ABNORMAL HIGH (ref 11.5–15.5)
WBC: 5.4 10*3/uL (ref 4.0–10.5)
nRBC: 0 % (ref 0.0–0.2)

## 2022-10-19 LAB — BASIC METABOLIC PANEL
Anion gap: 8 (ref 5–15)
BUN: 19 mg/dL (ref 8–23)
CO2: 30 mmol/L (ref 22–32)
Calcium: 8.1 mg/dL — ABNORMAL LOW (ref 8.9–10.3)
Chloride: 94 mmol/L — ABNORMAL LOW (ref 98–111)
Creatinine, Ser: 1.71 mg/dL — ABNORMAL HIGH (ref 0.61–1.24)
GFR, Estimated: 44 mL/min — ABNORMAL LOW (ref >60)
Glucose, Bld: 224 mg/dL — ABNORMAL HIGH (ref 70–99)
Potassium: 3.5 mmol/L (ref 3.5–5.1)
Sodium: 132 mmol/L — ABNORMAL LOW (ref 135–145)

## 2022-10-19 LAB — HEPATIC FUNCTION PANEL
ALT: 22 U/L (ref 0–44)
AST: 48 U/L — ABNORMAL HIGH (ref 15–41)
Albumin: 2.5 g/dL — ABNORMAL LOW (ref 3.5–5.0)
Alkaline Phosphatase: 91 U/L (ref 38–126)
Bilirubin, Direct: 1.2 mg/dL — ABNORMAL HIGH (ref 0.0–0.2)
Indirect Bilirubin: 4.1 mg/dL — ABNORMAL HIGH (ref 0.3–0.9)
Total Bilirubin: 5.3 mg/dL — ABNORMAL HIGH (ref 0.3–1.2)
Total Protein: 6.2 g/dL — ABNORMAL LOW (ref 6.5–8.1)

## 2022-10-19 LAB — PROTIME-INR
INR: 1.6 — ABNORMAL HIGH (ref 0.8–1.2)
Prothrombin Time: 19 seconds — ABNORMAL HIGH (ref 11.4–15.2)

## 2022-10-19 LAB — BRAIN NATRIURETIC PEPTIDE: B Natriuretic Peptide: 44 pg/mL (ref 0.0–100.0)

## 2022-10-19 MED ORDER — INSULIN ASPART 100 UNIT/ML IJ SOLN: 0.0000 [IU] | Freq: Three times a day (TID) | INTRAMUSCULAR | Status: DC

## 2022-10-19 MED ORDER — OXYCODONE HCL 5 MG PO TABS
5.0000 mg | ORAL_TABLET | Freq: Four times a day (QID) | ORAL | Status: DC | PRN
  Administered 2022-10-19 – 2022-10-20 (×2): 5 mg via ORAL
  Filled 2022-10-19 (×2): qty 1

## 2022-10-19 MED ORDER — FUROSEMIDE 10 MG/ML IJ SOLN: 60.0000 mg | Freq: Two times a day (BID) | INTRAMUSCULAR | Status: DC

## 2022-10-19 MED ORDER — POTASSIUM CHLORIDE CRYS ER 20 MEQ PO TBCR
40.0000 meq | EXTENDED_RELEASE_TABLET | Freq: Two times a day (BID) | ORAL | Status: DC
  Administered 2022-10-19 – 2022-10-26 (×14): 40 meq via ORAL
  Filled 2022-10-19 (×14): qty 2

## 2022-10-19 MED ORDER — ENOXAPARIN SODIUM 100 MG/ML IJ SOSY
90.0000 mg | PREFILLED_SYRINGE | INTRAMUSCULAR | Status: DC
  Administered 2022-10-19 – 2022-10-21 (×3): 90 mg via SUBCUTANEOUS
  Filled 2022-10-19 (×3): qty 1

## 2022-10-19 MED ORDER — SERTRALINE HCL 50 MG PO TABS
100.0000 mg | ORAL_TABLET | Freq: Every day | ORAL | Status: DC
  Administered 2022-10-20 – 2022-10-26 (×7): 100 mg via ORAL
  Filled 2022-10-19 (×7): qty 2

## 2022-10-19 MED ORDER — CARVEDILOL 3.125 MG PO TABS: 6.2500 mg | ORAL_TABLET | Freq: Two times a day (BID) | ORAL | Status: DC

## 2022-10-19 MED ORDER — BOOST PO LIQD
237.0000 mL | Freq: Every evening | ORAL | Status: DC
  Administered 2022-10-20 – 2022-10-26 (×3): 237 mL via ORAL
  Filled 2022-10-19: qty 237

## 2022-10-19 MED ORDER — ENOXAPARIN SODIUM 40 MG/0.4ML IJ SOSY: 40.0000 mg | PREFILLED_SYRINGE | INTRAMUSCULAR | Status: DC

## 2022-10-19 MED ORDER — ALBUTEROL SULFATE (2.5 MG/3ML) 0.083% IN NEBU: 3.0000 mL | INHALATION_SOLUTION | Freq: Four times a day (QID) | RESPIRATORY_TRACT | Status: DC | PRN

## 2022-10-19 MED ORDER — RIFAXIMIN 550 MG PO TABS
550.0000 mg | ORAL_TABLET | Freq: Two times a day (BID) | ORAL | Status: DC
  Administered 2022-10-19 – 2022-10-26 (×14): 550 mg via ORAL
  Filled 2022-10-19 (×14): qty 1

## 2022-10-19 MED ORDER — LACTULOSE 10 GM/15ML PO SOLN
20.0000 g | Freq: Two times a day (BID) | ORAL | Status: DC
  Administered 2022-10-19: 20 g via ORAL
  Filled 2022-10-19 (×2): qty 30

## 2022-10-19 MED ORDER — PANTOPRAZOLE SODIUM 40 MG PO TBEC
40.0000 mg | DELAYED_RELEASE_TABLET | Freq: Every day | ORAL | Status: DC
  Administered 2022-10-20 – 2022-10-26 (×7): 40 mg via ORAL
  Filled 2022-10-19 (×7): qty 1

## 2022-10-19 MED ORDER — ONDANSETRON HCL 4 MG PO TABS
8.0000 mg | ORAL_TABLET | Freq: Three times a day (TID) | ORAL | Status: DC | PRN
  Administered 2022-10-20: 8 mg via ORAL
  Filled 2022-10-19: qty 2

## 2022-10-19 MED ORDER — FUROSEMIDE 10 MG/ML IJ SOLN
100.0000 mg | INTRAVENOUS | Status: AC
  Administered 2022-10-19: 100 mg via INTRAVENOUS
  Filled 2022-10-19: qty 10

## 2022-10-19 MED ORDER — LEVOTHYROXINE SODIUM 75 MCG PO TABS
175.0000 ug | ORAL_TABLET | Freq: Every day | ORAL | Status: DC
  Administered 2022-10-20 – 2022-10-26 (×7): 175 ug via ORAL
  Filled 2022-10-19 (×2): qty 1
  Filled 2022-10-19: qty 3
  Filled 2022-10-19 (×4): qty 1

## 2022-10-19 MED ORDER — HYDROXYZINE HCL 25 MG PO TABS
50.0000 mg | ORAL_TABLET | Freq: Three times a day (TID) | ORAL | Status: DC | PRN
  Administered 2022-10-20 (×2): 50 mg via ORAL
  Filled 2022-10-19 (×2): qty 2

## 2022-10-19 MED ORDER — SPIRONOLACTONE 25 MG PO TABS
100.0000 mg | ORAL_TABLET | Freq: Every day | ORAL | Status: DC
  Administered 2022-10-20 – 2022-10-26 (×7): 100 mg via ORAL
  Filled 2022-10-19 (×7): qty 4

## 2022-10-19 MED ORDER — INSULIN ASPART 100 UNIT/ML IJ SOLN: 0.0000 [IU] | Freq: Every day | INTRAMUSCULAR | Status: DC

## 2022-10-19 MED ORDER — GABAPENTIN 100 MG PO CAPS
100.0000 mg | ORAL_CAPSULE | Freq: Every day | ORAL | Status: DC | PRN
  Administered 2022-10-20 – 2022-10-25 (×5): 100 mg via ORAL
  Filled 2022-10-19 (×6): qty 1

## 2022-10-19 MED ORDER — TRAZODONE HCL 50 MG PO TABS
50.0000 mg | ORAL_TABLET | Freq: Every day | ORAL | Status: DC
  Administered 2022-10-19: 50 mg via ORAL
  Filled 2022-10-19: qty 1

## 2022-10-19 MED ORDER — ALBUMIN HUMAN 25 % IV SOLN
25.0000 g | Freq: Four times a day (QID) | INTRAVENOUS | Status: DC
  Administered 2022-10-19 – 2022-10-20 (×4): 25 g via INTRAVENOUS
  Filled 2022-10-19 (×5): qty 100

## 2022-10-19 NOTE — ED Notes (Signed)
ED TO INPATIENT HANDOFF REPORT  ED Nurse Name and Phone #: [phone]  S Name/Age/Gender Daniel Crawford 66 y.o. male Room/Bed: APA11/APA11  Code Status   Code Status: Full Code  Home/SNF/Other Home Patient oriented to: situation Is this baseline? Yes   Triage Complete: Triage complete  Chief Complaint Anasarca [R60.1]  Triage Note Pt via POV c/o leg swelling and weight fluctuation ongoing for some time, worse over the past few days. He says his weight has increased significantly over the past few days and he thinks he is retaining fluid. Pt also notes some SOB. Swelling and dry skin noted to bilateral feet and ankles.    Allergies No Known Allergies  Level of Care/Admitting Diagnosis ED Disposition     ED Disposition  Admit   Condition  --   Comment  Hospital Area: [facility] [[id number]]  Level of Care: Telemetry [5]  Covid Evaluation: Asymptomatic - no recent exposure (last 10 days) testing not required  Diagnosis: Anasarca [960454]  Admitting Physician: Levie Heritage [[id number]]  Attending Physician: Levie Heritage [[id number]]  Certification:: I certify this patient will need inpatient services for at least 2 midnights  Estimated Length of Stay: 3          B Medical/Surgery History Past Medical History:  Diagnosis Date   Anemia    Anxiety    Asthma    CHF (congestive heart failure) (HCC)    CKD (chronic kidney disease)    Depression    Diabetes mellitus without complication (HCC)    Dyspnea    Hypertension    Hypothyroidism    Liver cirrhosis secondary to NASH (HCC)    NASH (nonalcoholic steatohepatitis)    Pre-diabetes    Spleen enlarged    Thrombocytopenia (HCC)    Past Surgical History:  Procedure Laterality Date   Bilateral hernia surgery     2006, 2007   CHOLECYSTECTOMY     2016    COLONOSCOPY WITH PROPOFOL N/A 06/28/2021   Procedure: COLONOSCOPY WITH PROPOFOL;  Surgeon: Malissa Hippo, MD;  [facility];  Service: Endoscopy;   Laterality: N/A;  1020   ESOPHAGEAL DILATION N/A 11/06/2019   Procedure: ESOPHAGEAL DILATION;  Surgeon: Dolores Frame, MD;  [facility];  Service: Gastroenterology;  Laterality: N/A;   ESOPHAGOGASTRODUODENOSCOPY (EGD) WITH PROPOFOL N/A 11/06/2019   Procedure: ESOPHAGOGASTRODUODENOSCOPY (EGD) WITH PROPOFOL;  Surgeon: Dolores Frame, MD;  [facility];  Service: Gastroenterology;  Laterality: N/A;  1045   IR RADIOLOGIST EVAL & MGMT  02/19/2022   LAPAROSCOPIC ASSISTED VENTRAL HERNIA REPAIR     Polyp removed     in January of 2018.    POLYPECTOMY  06/28/2021   Procedure: POLYPECTOMY INTESTINAL;  Surgeon: Malissa Hippo, MD;  [facility];  Service: Endoscopy;;   RIGHT HEART CATH N/A 01/08/2022   Procedure: RIGHT HEART CATH;  Surgeon: Corky Crafts, MD;  Location: [facility] INVASIVE CV LAB;  Service: Cardiovascular;  Laterality: N/A;     A IV Location/Drains/Wounds Patient Lines/Drains/Airways Status     Active Line/Drains/Airways     Name Placement date Placement time Site Days   Peripheral IV 10/19/22 20 G Anterior;Distal;Left;Upper Arm 10/19/22  1535  Arm  less than 1   External Urinary Catheter 10/19/22  1645  --  less than 1            Intake/Output Last 24 hours No intake or output data in the 24 hours ending 10/19/22 1957  Labs/Imaging Results for orders placed or performed during the hospital encounter of 10/19/22 (from the past 48 hour(s))  Basic metabolic panel     Status: Abnormal   Collection Time: 10/19/22  2:15 PM  Result Value Ref Range   Sodium 132 (L) 135 - 145 mmol/L   Potassium 3.5 3.5 - 5.1 mmol/L   Chloride 94 (L) 98 - 111 mmol/L   CO2 30 22 - 32 mmol/L   Glucose, Bld 224 (H) 70 - 99 mg/dL    Comment: Glucose reference range applies only to samples taken after fasting for at least 8 hours.   BUN 19 8 - 23 mg/dL   Creatinine, Ser 1.61 (H) 0.61 - 1.24 mg/dL   Calcium 8.1 (L) 8.9 - 10.3 mg/dL   GFR,  Estimated 44 (L) >60 mL/min    Comment: (NOTE) Calculated using the CKD-EPI Creatinine Equation (2021)    Anion gap 8 5 - 15    Comment: Performed at [facility], [address], [place], [place] [address]  CBC     Status: Abnormal   Collection Time: 10/19/22  2:15 PM  Result Value Ref Range   WBC 5.4 4.0 - 10.5 K/uL   RBC 3.37 (L) 4.22 - 5.81 MIL/uL   Hemoglobin 9.9 (L) 13.0 - 17.0 g/dL   HCT 54.0 (L) 98.1 - 19.1 %   MCV 91.7 80.0 - 100.0 fL   MCH 29.4 26.0 - 34.0 pg   MCHC 32.0 30.0 - 36.0 g/dL   RDW 47.8 (H) 29.5 - 62.1 %   Platelets 76 (L) 150 - 400 K/uL    Comment: SPECIMEN CHECKED FOR CLOTS Immature Platelet Fraction Sahm be clinically indicated, consider ordering this additional test [id number] CONSISTENT WITH PREVIOUS RESULT REPEATED TO VERIFY    nRBC 0.0 0.0 - 0.2 %    Comment: Performed at [facility], [address] [place], [place] [address]  Brain natriuretic peptide     Status: None   Collection Time: 10/19/22  2:15 PM  Result Value Ref Range   B Natriuretic Peptide 44.0 0.0 - 100.0 pg/mL    Comment: Performed at [facility], [address], [place], [place] [address]  Hepatic function panel     Status: Abnormal   Collection Time: 10/19/22  2:15 PM  Result Value Ref Range   Total Protein 6.2 (L) 6.5 - 8.1 g/dL   Albumin 2.5 (L) 3.5 - 5.0 g/dL   AST 48 (H) 15 - 41 U/L   ALT 22 0 - 44 U/L   Alkaline Phosphatase 91 38 - 126 U/L   Total Bilirubin 5.3 (H) 0.3 - 1.2 mg/dL   Bilirubin, Direct 1.2 (H) 0.0 - 0.2 mg/dL   Indirect Bilirubin 4.1 (H) 0.3 - 0.9 mg/dL    Comment: Performed at [facility] [address], [place], [place] [address]   DG Chest 2 View  Result Date: 10/19/2022 CLINICAL DATA:  Shortness of breath. EXAM: CHEST - 2 VIEW COMPARISON:  September 15, 2022. FINDINGS: The heart size and mediastinal contours are within normal limits. Both lungs are clear. The visualized skeletal structures are unremarkable. IMPRESSION: No active cardiopulmonary disease.  Electronically Signed   By: Lupita Raider M.D.   On: 10/19/2022 15:08   [place] ASCITES (ABDOMEN LIMITED)  Result Date: 10/19/2022 CLINICAL DATA:  History of cirrhosis and ascites. Evaluation for possible paracentesis. EXAM: LIMITED ABDOMEN ULTRASOUND FOR ASCITES TECHNIQUE: Limited ultrasound survey for ascites was performed in all four abdominal quadrants. COMPARISON:  CT of  the abdomen and pelvis on 10/02/2022 FINDINGS: Four quadrant survey of the peritoneal cavity demonstrates no significant ascites. Paracentesis was therefore not performed. IMPRESSION: No significant ascites. Paracentesis was not performed. Electronically Signed   By: Irish Lack M.D.   On: 10/19/2022 14:04    Pending Labs Unresulted Labs (From admission, onward)     Start     Ordered   Signed and Held  Comprehensive metabolic panel  Daily,   R      Signed and Held            Vitals/Pain Today's Vitals   10/19/22 1404 10/19/22 1404 10/19/22 1827 10/19/22 1923  BP:  105/66  129/60  Pulse:  82  83  Resp:  14  15  Temp:  98.7 F (37.1 C) 98 F (36.7 C)   TempSrc:  Oral Oral   SpO2:  94%  95%  Weight: (!) 184.6 kg     Height: 6\' 2"  (1.88 m)     PainSc: 6        Isolation Precautions No active isolations  Medications Medications  albumin human 25 % solution 25 g (25 g Intravenous New Bag/Given 10/19/22 1709)  spironolactone (ALDACTONE) tablet 100 mg (has no administration in time range)  furosemide (LASIX) 100 mg in dextrose 5 % 50 mL IVPB (0 mg Intravenous Stopped 10/19/22 1703)    Mobility walks with person assist     Focused Assessments Pulmonary Assessment Handoff:  Lung sounds:   O2 Device: Room Air      R Recommendations: See Admitting Provider Note  Report given to:   Additional Notes: N/A

## 2022-10-19 NOTE — ED Provider Notes (Signed)
[place] [facility] DEPARTMENT AT [facility] Provider Note   CSN: [id number] Arrival date & time: 10/19/22  1352     History {Add pertinent medical, surgical, social history, OB history to HPI:1} Chief Complaint  Patient presents with   Leg Swelling    Daniel Crawford is a 67 y.o. male.  HPI   This patient is a 67 year old male with a well-known history of liver failure, he is under the care of Dr. Levon Hedger with the gastroenterology service and is taking multiple different medications including spironolactone 100 mg/day, lactulose as needed but he is not having bowel movements and currently taking torsemide, 60 mg by mouth twice a day.  He had seen his family doctor earlier in the week and was told that he gained about 30 pounds since June.  He has been feeling increasingly swollen and short of breath, he was feeling uncomfortable going into the weekend feeling this way so he comes to the hospital.  He has been evaluated and admitted and treated multiple times in the past in an inpatient setting for severe fluid overload.  The patient does feel dyspneic on exertion as well as orthopnea, this is somewhat chronic but feels worse at this time.  Home Medications Prior to Admission medications   Medication Sig Start Date End Date Taking? Authorizing Provider  acetaminophen (TYLENOL) 500 MG tablet Take 1,000 mg by mouth every 6 (six) hours as needed for moderate pain.    [provider]  albuterol (VENTOLIN HFA) 108 (90 Base) MCG/ACT inhaler Inhale 2 puffs into the lungs every 6 (six) hours as needed for wheezing or shortness of breath. 05/01/22   Oretha Milch, MD  carvedilol (COREG) 6.25 MG tablet Take 1 tablet (6.25 mg total) by mouth 2 (two) times daily with a meal. 01/25/22   Emokpae, Courage, MD  gabapentin (NEURONTIN) 100 MG capsule Take 100 mg by mouth daily as needed (pain). 02/20/21   [provider]  hydrOXYzine (VISTARIL) 50 MG capsule Take 1 capsule (50 mg  total) by mouth 3 (three) times daily as needed for anxiety or nausea. 01/25/22   Shon Hale, MD  lactose free nutrition (BOOST) LIQD Take 237 mLs by mouth every evening.    [provider]  lactulose (CHRONULAC) 10 GM/15ML solution Take 30 mLs (20 g total) by mouth 2 (two) times daily. 07/13/22   Catarina Hartshorn, MD  levothyroxine (SYNTHROID) 175 MCG tablet Take 175 mcg by mouth daily before breakfast.    [provider]  ondansetron (ZOFRAN) 8 MG tablet Take by mouth every 8 (eight) hours as needed for nausea or vomiting.    [provider]  pantoprazole (PROTONIX) 40 MG tablet Take 1 tablet (40 mg total) by mouth daily. 01/25/22   Shon Hale, MD  potassium chloride SA (KLOR-CON M) 20 MEQ tablet Take 2 tablets (40 mEq total) by mouth 2 (two) times daily. 07/25/22   Laurey Morale, MD  rifaximin (XIFAXAN) 550 MG TABS tablet Take 1 tablet (550 mg total) by mouth 2 (two) times daily. 01/25/22   Shon Hale, MD  sertraline (ZOLOFT) 100 MG tablet Take 100 mg by mouth daily. 09/24/22   [provider]  spironolactone (ALDACTONE) 100 MG tablet Take 1 tablet (100 mg total) by mouth daily. 03/04/22   Johnson, Clanford L, MD  torsemide 60 MG TABS Take 60 mg by mouth 2 (two) times daily. 10/05/22   Azucena Fallen, MD  traZODone (DESYREL) 50 MG tablet Take 1 tablet (50  mg total) by mouth at bedtime. 01/25/22   Shon Hale, MD      Allergies    Patient has no known allergies.    Review of Systems   Review of Systems  All other systems reviewed and are negative.   Physical Exam Updated Vital Signs BP 105/66 (BP Location: Right Arm)   Pulse 82   Temp 98.7 F (37.1 C) (Oral)   Resp 14   Ht 1.88 m (6\' 2" )   Wt (!) 184.6 kg   SpO2 94%   BMI 52.26 kg/m  Physical Exam Vitals and nursing note reviewed.  Constitutional:      General: He is not in acute distress.    Appearance: He is well-developed.  HENT:     Head: Normocephalic and atraumatic.      Mouth/Throat:     Pharynx: No oropharyngeal exudate.  Eyes:     General: No scleral icterus.       Right eye: No discharge.        Left eye: No discharge.     Conjunctiva/sclera: Conjunctivae normal.     Pupils: Pupils are equal, round, and reactive to light.  Neck:     Thyroid: No thyromegaly.     Vascular: No JVD.  Cardiovascular:     Rate and Rhythm: Normal rate and regular rhythm.     Heart sounds: Normal heart sounds. No murmur heard.    No friction rub. No gallop.  Pulmonary:     Effort: No respiratory distress.     Breath sounds: Normal breath sounds. No wheezing or rales.     Comments: Moderate tachypnea Abdominal:     General: Bowel sounds are normal. There is no distension.     Palpations: Abdomen is soft. There is no mass.     Tenderness: There is no abdominal tenderness.  Musculoskeletal:        General: No tenderness. Normal range of motion.     Cervical back: Normal range of motion and neck supple.     Right lower leg: Edema present.     Left lower leg: Edema present.  Lymphadenopathy:     Cervical: No cervical adenopathy.  Skin:    General: Skin is warm and dry.     Findings: No erythema or rash.  Neurological:     Mental Status: He is alert.     Coordination: Coordination normal.  Psychiatric:        Behavior: Behavior normal.     ED Results / Procedures / Treatments   Labs (all labs ordered are listed, but only abnormal results are displayed) Labs Reviewed  BASIC METABOLIC PANEL - Abnormal; Notable for the following components:      Result Value   Sodium 132 (*)    Chloride 94 (*)    Glucose, Bld 224 (*)    Creatinine, Ser 1.71 (*)    Calcium 8.1 (*)    GFR, Estimated 44 (*)    All other components within normal limits  CBC - Abnormal; Notable for the following components:   RBC 3.37 (*)    Hemoglobin 9.9 (*)    HCT 30.9 (*)    RDW 17.8 (*)    Platelets 76 (*)    All other components within normal limits  HEPATIC FUNCTION PANEL -  Abnormal; Notable for the following components:   Total Protein 6.2 (*)    Albumin 2.5 (*)    AST 48 (*)    Total Bilirubin 5.3 (*)  Bilirubin, Direct 1.2 (*)    Indirect Bilirubin 4.1 (*)    All other components within normal limits  BRAIN NATRIURETIC PEPTIDE    EKG EKG Interpretation Date/Time:  Friday October 19 2022 14:08:27 EDT Ventricular Rate:  86 PR Interval:  202 QRS Duration:  94 QT Interval:  424 QTC Calculation: 507 R Axis:   19  Text Interpretation: Normal sinus rhythm Cannot rule out Anterior infarct , age undetermined Prolonged QT Abnormal ECG When compared with ECG of 15-Sep-2022 12:17, No significant change since last tracing Confirmed by Meridee Score [phone]) on 10/19/2022 2:16:30 PM  Radiology DG Chest 2 View  Result Date: 10/19/2022 CLINICAL DATA:  Shortness of breath. EXAM: CHEST - 2 VIEW COMPARISON:  September 15, 2022. FINDINGS: The heart size and mediastinal contours are within normal limits. Both lungs are clear. The visualized skeletal structures are unremarkable. IMPRESSION: No active cardiopulmonary disease. Electronically Signed   By: Lupita Raider M.D.   On: 10/19/2022 15:08   [place] ASCITES (ABDOMEN LIMITED)  Result Date: 10/19/2022 CLINICAL DATA:  History of cirrhosis and ascites. Evaluation for possible paracentesis. EXAM: LIMITED ABDOMEN ULTRASOUND FOR ASCITES TECHNIQUE: Limited ultrasound survey for ascites was performed in all four abdominal quadrants. COMPARISON:  CT of the abdomen and pelvis on 10/02/2022 FINDINGS: Four quadrant survey of the peritoneal cavity demonstrates no significant ascites. Paracentesis was therefore not performed. IMPRESSION: No significant ascites. Paracentesis was not performed. Electronically Signed   By: Irish Lack M.D.   On: 10/19/2022 14:04    Procedures Procedures  {Document cardiac monitor, telemetry assessment procedure when appropriate:1}  Medications Ordered in ED Medications  furosemide (LASIX) 100 mg in  dextrose 5 % 50 mL IVPB (100 mg Intravenous New Bag/Given 10/19/22 1558)    ED Course/ Medical Decision Making/ A&P   {   Click here for ABCD2, HEART and other calculatorsREFRESH Note before signing :1}                          Medical Decision Making Amount and/or Complexity of Data Reviewed Labs: ordered. Radiology: ordered.  Risk Decision regarding hospitalization.    This patient presents to the ED for concern of fluid overload, this involves an extensive number of treatment options, and is a complaint that carries with it a high risk of complications and morbidity.  The differential diagnosis includes ascites, CHF, hepatic failure, the patient has anasarca all the way up to his chest   Co morbidities that complicate the patient evaluation  Liver failure   Additional history obtained:  Additional history obtained from medical record External records from outside source obtained and reviewed including recent ultrasound for paracentesis showed minimal ascites Review of the patient's weight shows that he is about 40 pounds heavier than he was in April and 10 pounds heavier than he was when he was discharged on July 12 after 3-day admission for fluid overload   Lab Tests:  I Ordered, and personally interpreted labs.  The pertinent results include: Mild hyponatremia, chronic, creatinine of 1.7, chronic, chronic thrombocytopenia unchanged, chronic anemia unchanged, BNP of 44   Imaging Studies ordered:  I ordered imaging studies including chest x-ray I independently visualized and interpreted imaging which showed no acute pulmonary edema I agree with the radiologist interpretation   Cardiac Monitoring: / EKG:  The patient was maintained on a cardiac monitor.  I personally viewed and interpreted the cardiac monitored which showed an underlying rhythm of: Sinus rhythm in the  66s   Consultations Obtained:  I requested consultation with the hospitalist,  and discussed lab  and imaging findings as well as pertinent plan - they recommend: Admission   Problem List / ED Course / Critical interventions / Medication management  Significant fluid overload, no pulmonary edema I ordered medication including intravenous Lasix for severe fluid overload Reevaluation of the patient after these medicines showed that the patient no change I have reviewed the patients home medicines and have made adjustments as needed   Social Determinants of Health:  Liver failure   Test / Admission - Considered:  Will admit   {Document critical care time when appropriate:1} {Document review of labs and clinical decision tools ie heart score, Chads2Vasc2 etc:1}  {Document your independent review of radiology images, and any outside records:1} {Document your discussion with family members, caretakers, and with consultants:1} {Document social determinants of health affecting pt's care:1} {Document your decision making why or why not admission, treatments were needed:1} Final Clinical Impression(s) / ED Diagnoses Final diagnoses:  Anasarca  Chronic liver failure without hepatic coma (HCC)  Chronic hyponatremia  Thrombocytopenia (HCC)    Rx / DC Orders ED Discharge Orders     None

## 2022-10-19 NOTE — H&P (Addendum)
History and Physical    Patient: Daniel Crawford ZOX:[id number] DOB: [birth year] DOA: 10/19/2022 DOS: the patient was seen and examined on 10/19/2022 PCP: [facility]  Patient coming from: Home  Chief Complaint:  Chief Complaint  Patient presents with   Leg Swelling   HPI: Daniel Crawford is a 67 y.o. male with medical history significant of cirrhosis secondary to NASH, diabetes stage IIIb chronic kidney disease, diastolic heart failure, hypertension, hypothyroidism, asthma.  Patient has been in and out of the hospital over the past several months.  He has developed significant amount of anasarca.  He has been on torsemide and spironolactone.  Unfortunately he also has a significant degree of hypoalbuminemia.  He has gained approximately 30 to 40 pounds over the past 4 to 5 months.  He has been compliant with his medications: He has been taking torsemide 40 mg twice a day and his spironolactone 100 mg in the morning.  He tries to eat a low-sodium diet.  He does appear that he is not on anything for his diabetes.  Today, he had an ultrasound performed by the GI nurse practitioner to look for ascites with the thought that he could have a paracentesis if demonstrable ascites was seen, however not much ascites was seen.  He was asked to be brought to the emergency department due to him becoming short of breath every time he ambulated.  He felt that he would end up in the emergency department shortly anyway.  Review of Systems: As mentioned in the history of present illness. All other systems reviewed and are negative. Past Medical History:  Diagnosis Date   Anemia    Anxiety    Asthma    CHF (congestive heart failure) (HCC)    CKD (chronic kidney disease)    Depression    Diabetes mellitus without complication (HCC)    Dyspnea    Hypertension    Hypothyroidism    Liver cirrhosis secondary to NASH (HCC)    NASH (nonalcoholic steatohepatitis)    Pre-diabetes    Spleen enlarged     Thrombocytopenia (HCC)    Past Surgical History:  Procedure Laterality Date   Bilateral hernia surgery     2006, 2007   CHOLECYSTECTOMY     2016    COLONOSCOPY WITH PROPOFOL N/A 06/28/2021   Procedure: COLONOSCOPY WITH PROPOFOL;  Surgeon: Malissa Hippo, MD;  [facility];  Service: Endoscopy;  Laterality: N/A;  1020   ESOPHAGEAL DILATION N/A 11/06/2019   Procedure: ESOPHAGEAL DILATION;  Surgeon: Dolores Frame, MD;  Location: [facility];  Service: Gastroenterology;  Laterality: N/A;   ESOPHAGOGASTRODUODENOSCOPY (EGD) WITH PROPOFOL N/A 11/06/2019   Procedure: ESOPHAGOGASTRODUODENOSCOPY (EGD) WITH PROPOFOL;  Surgeon: Dolores Frame, MD;  Location: [facility];  Service: Gastroenterology;  Laterality: N/A;  1045   IR RADIOLOGIST EVAL & MGMT  02/19/2022   LAPAROSCOPIC ASSISTED VENTRAL HERNIA REPAIR     Polyp removed     in January of 2018.    POLYPECTOMY  06/28/2021   Procedure: POLYPECTOMY INTESTINAL;  Surgeon: Malissa Hippo, MD;  Location: [facility];  Service: Endoscopy;;   RIGHT HEART CATH N/A 01/08/2022   Procedure: RIGHT HEART CATH;  Surgeon: Corky Crafts, MD;  Location: [facility] INVASIVE CV LAB;  Service: Cardiovascular;  Laterality: N/A;   Social History:  reports that he quit smoking about 7 years ago. His smoking use included cigarettes. He started smoking about 27 years ago. He has a 20 pack-year  smoking history. He has been exposed to tobacco smoke. He has never used smokeless tobacco. He reports that he does not drink alcohol and does not use drugs.  No Known Allergies  Family History  Problem Relation Age of Onset   Liver cancer Mother    Heart disease Mother    Aneurysm Sister     Prior to Admission medications   Medication Sig Start Date End Date Taking? Authorizing Provider  acetaminophen (TYLENOL) 500 MG tablet Take 1,000 mg by mouth every 6 (six) hours as needed for moderate pain.    [provider]  albuterol  (VENTOLIN HFA) 108 (90 Base) MCG/ACT inhaler Inhale 2 puffs into the lungs every 6 (six) hours as needed for wheezing or shortness of breath. 05/01/22   Oretha Milch, MD  carvedilol (COREG) 6.25 MG tablet Take 1 tablet (6.25 mg total) by mouth 2 (two) times daily with a meal. 01/25/22   Emokpae, Courage, MD  gabapentin (NEURONTIN) 100 MG capsule Take 100 mg by mouth daily as needed (pain). 02/20/21   [provider]  hydrOXYzine (VISTARIL) 50 MG capsule Take 1 capsule (50 mg total) by mouth 3 (three) times daily as needed for anxiety or nausea. 01/25/22   Shon Hale, MD  lactose free nutrition (BOOST) LIQD Take 237 mLs by mouth every evening.    [provider]  lactulose (CHRONULAC) 10 GM/15ML solution Take 30 mLs (20 g total) by mouth 2 (two) times daily. 07/13/22   Catarina Hartshorn, MD  levothyroxine (SYNTHROID) 175 MCG tablet Take 175 mcg by mouth daily before breakfast.    [provider]  ondansetron (ZOFRAN) 8 MG tablet Take by mouth every 8 (eight) hours as needed for nausea or vomiting.    [provider]  pantoprazole (PROTONIX) 40 MG tablet Take 1 tablet (40 mg total) by mouth daily. 01/25/22   Shon Hale, MD  potassium chloride SA (KLOR-CON M) 20 MEQ tablet Take 2 tablets (40 mEq total) by mouth 2 (two) times daily. 07/25/22   Laurey Morale, MD  rifaximin (XIFAXAN) 550 MG TABS tablet Take 1 tablet (550 mg total) by mouth 2 (two) times daily. 01/25/22   Shon Hale, MD  sertraline (ZOLOFT) 100 MG tablet Take 100 mg by mouth daily. 09/24/22   [provider]  spironolactone (ALDACTONE) 100 MG tablet Take 1 tablet (100 mg total) by mouth daily. 03/04/22   Johnson, Clanford L, MD  torsemide (DEMADEX) 20 MG tablet Take 40 mg by mouth 2 (two) times daily.    [provider]  traZODone (DESYREL) 50 MG tablet Take 1 tablet (50 mg total) by mouth at bedtime. 01/25/22   Shon Hale, MD    Physical Exam: Vitals:   10/19/22 1403  10/19/22 1404 10/19/22 1404  BP:   105/66  Pulse:   82  Resp:   14  Temp:   98.7 F (37.1 C)  TempSrc:   Oral  SpO2: 94%  94%  Weight:  (!) 184.6 kg   Height:  6\' 2"  (1.88 m)    General: Elderly male. Awake and alert and oriented x3. No acute cardiopulmonary distress.  HEENT: Normocephalic atraumatic.  Right and left ears normal in appearance.  Pupils equal, round, reactive to light. Extraocular muscles are intact. Sclerae anicteric and noninjected.  Moist mucosal membranes. No mucosal lesions.  Neck: Neck supple without lymphadenopathy. No carotid bruits. No masses palpated.  Cardiovascular: Regular rate with normal S1-S2 sounds. No murmurs, rubs, gallops auscultated. No JVD.  There is 4+ pitting edema in his lower extremities bilaterally that extends up to his thigh. Respiratory: Good respiratory effort with no wheezes, rales, rhonchi. Lungs clear to auscultation bilaterally.  No accessory muscle use. Abdomen: Soft, nontender, nondistended. Active bowel sounds. No masses or hepatosplenomegaly  Skin: No rashes, lesions, or ulcerations.  Dry, warm to touch. 2+ dorsalis pedis and radial pulses. Musculoskeletal: No calf or leg pain. All major joints not erythematous nontender.  No upper or lower joint deformation.  Good ROM.  No contractures  Psychiatric: Intact judgment and insight. Pleasant and cooperative. Neurologic: No focal neurological deficits. Strength is 5/5 and symmetric in upper and lower extremities.  Cranial nerves II through XII are grossly intact.  Data Reviewed: Results for orders placed or performed during the hospital encounter of 10/19/22 (from the past 24 hour(s))  Basic metabolic panel     Status: Abnormal   Collection Time: 10/19/22  2:15 PM  Result Value Ref Range   Sodium 132 (L) 135 - 145 mmol/L   Potassium 3.5 3.5 - 5.1 mmol/L   Chloride 94 (L) 98 - 111 mmol/L   CO2 30 22 - 32 mmol/L   Glucose, Bld 224 (H) 70 - 99 mg/dL   BUN 19 8 - 23 mg/dL   Creatinine, Ser  1.61 (H) 0.61 - 1.24 mg/dL   Calcium 8.1 (L) 8.9 - 10.3 mg/dL   GFR, Estimated 44 (L) >60 mL/min   Anion gap 8 5 - 15  CBC     Status: Abnormal   Collection Time: 10/19/22  2:15 PM  Result Value Ref Range   WBC 5.4 4.0 - 10.5 K/uL   RBC 3.37 (L) 4.22 - 5.81 MIL/uL   Hemoglobin 9.9 (L) 13.0 - 17.0 g/dL   HCT 09.6 (L) 04.5 - 40.9 %   MCV 91.7 80.0 - 100.0 fL   MCH 29.4 26.0 - 34.0 pg   MCHC 32.0 30.0 - 36.0 g/dL   RDW 81.1 (H) 91.4 - 78.2 %   Platelets 76 (L) 150 - 400 K/uL   nRBC 0.0 0.0 - 0.2 %  Brain natriuretic peptide     Status: None   Collection Time: 10/19/22  2:15 PM  Result Value Ref Range   B Natriuretic Peptide 44.0 0.0 - 100.0 pg/mL  Hepatic function panel     Status: Abnormal   Collection Time: 10/19/22  2:15 PM  Result Value Ref Range   Total Protein 6.2 (L) 6.5 - 8.1 g/dL   Albumin 2.5 (L) 3.5 - 5.0 g/dL   AST 48 (H) 15 - 41 U/L   ALT 22 0 - 44 U/L   Alkaline Phosphatase 91 38 - 126 U/L   Total Bilirubin 5.3 (H) 0.3 - 1.2 mg/dL   Bilirubin, Direct 1.2 (H) 0.0 - 0.2 mg/dL   Indirect Bilirubin 4.1 (H) 0.3 - 0.9 mg/dL    DG Chest 2 View  Result Date: 10/19/2022 CLINICAL DATA:  Shortness of breath. EXAM: CHEST - 2 VIEW COMPARISON:  September 15, 2022. FINDINGS: The heart size and mediastinal contours are within normal limits. Both lungs are clear. The visualized skeletal structures are unremarkable. IMPRESSION: No active cardiopulmonary disease. Electronically Signed   By: Lupita Raider M.D.   On: 10/19/2022 15:08   [place] ASCITES (ABDOMEN LIMITED)  Result Date: 10/19/2022 CLINICAL DATA:  History of cirrhosis and ascites. Evaluation for possible paracentesis. EXAM: LIMITED ABDOMEN ULTRASOUND FOR ASCITES TECHNIQUE: Limited ultrasound survey for ascites was performed in all four abdominal quadrants. COMPARISON:  CT of the abdomen and pelvis on 10/02/2022 FINDINGS: Four quadrant survey of the peritoneal cavity demonstrates no significant ascites. Paracentesis was therefore not  performed. IMPRESSION: No significant ascites. Paracentesis was not performed. Electronically Signed   By: Irish Lack M.D.   On: 10/19/2022 14:04     Assessment and Plan: No notes have been filed under this hospital service. Service: Hospitalist  Principal Problem:   Anasarca Active Problems:   Cirrhosis of liver with ascites (HCC)   Chronic diastolic heart failure (HCC)   Essential hypertension   Morbid obesity (HCC)   T2DM (type 2 diabetes mellitus) (HCC)   CKD stage 3a, GFR 45-59 ml/min (HCC)   OSA (obstructive sleep apnea)   Acquired hypothyroidism   Decompensation of cirrhosis of liver (HCC)   Acute exacerbation of congestive heart failure (HCC)   NASH (nonalcoholic steatohepatitis)   Class 3 severe obesity in adult [facility])  Anasarca secondary to decompensated cirrhosis of the liver secondary to NASH with hypoalbuminemia and minimal ascites Admit to telemetry Diuresis with Lasix 60 mg IV twice daily Potassium replacement Daily CMP's Echo not needed as she had an echo within the last 6 months which showed normal ejection fraction Continue spironolactone Albumin 25 mg every 6 hours Continue Coreg Unfortunately, his hypoalbuminemia and cirrhosis will predispose him to frequent admissions due to being fluid overloaded. Diastolic heart failure Diuresis as above Will watch electrolyte abnormalities and serum creatinine Type 2 diabetes Sliding scale insulin with CBGs He will likely need long-acting insulin Chronic kidney disease stage IIIa Will watch serum creatinine Hypothyroidism Continue Synthroid Hypertension Continue antihypertensives Obesity   Advance Care Planning:   Code Status: Full Code   Consults: none  Family Communication:   Severity of Illness: The appropriate patient status for this patient is INPATIENT. Inpatient status is judged to be reasonable and necessary in order to provide the required intensity of service to ensure the patient's safety.  The patient's presenting symptoms, physical exam findings, and initial radiographic and laboratory data in the context of their chronic comorbidities is felt to place them at high risk for further clinical deterioration. Furthermore, it is not anticipated that the patient will be medically stable for discharge from the hospital within 2 midnights of admission.   * I certify that at the point of admission it is my clinical judgment that the patient will require inpatient hospital care spanning beyond 2 midnights from the point of admission due to high intensity of service, high risk for further deterioration and high frequency of surveillance required.*  Author: Levie Heritage, DO 10/19/2022 5:08 PM  For on call review [email or web address].

## 2022-10-19 NOTE — ED Triage Notes (Addendum)
Pt via POV c/o leg swelling and weight fluctuation ongoing for some time, worse over the past few days. He says his weight has increased significantly over the past few days and he thinks he is retaining fluid. Pt also notes some SOB. Swelling and dry skin noted to bilateral feet and ankles.

## 2022-10-20 ENCOUNTER — Inpatient Hospital Stay (HOSPITAL_COMMUNITY): Payer: Medicare HMO

## 2022-10-20 DIAGNOSIS — N1831 Chronic kidney disease, stage 3a: Secondary | ICD-10-CM | POA: Diagnosis not present

## 2022-10-20 DIAGNOSIS — K7682 Hepatic encephalopathy: Secondary | ICD-10-CM | POA: Diagnosis not present

## 2022-10-20 LAB — URINALYSIS, W/ REFLEX TO CULTURE (INFECTION SUSPECTED)
Bacteria, UA: NONE SEEN
Bilirubin Urine: NEGATIVE
Glucose, UA: NEGATIVE mg/dL
Hgb urine dipstick: NEGATIVE
Ketones, ur: NEGATIVE mg/dL
Leukocytes,Ua: NEGATIVE
Nitrite: NEGATIVE
Protein, ur: NEGATIVE mg/dL
Specific Gravity, Urine: 1.008 (ref 1.005–1.030)
pH: 7 (ref 5.0–8.0)

## 2022-10-20 LAB — RAPID URINE DRUG SCREEN, HOSP PERFORMED
Amphetamines: NOT DETECTED
Barbiturates: NOT DETECTED
Benzodiazepines: NOT DETECTED
Cocaine: NOT DETECTED
Opiates: NOT DETECTED
Tetrahydrocannabinol: NOT DETECTED

## 2022-10-20 LAB — SARS CORONAVIRUS 2 BY RT PCR: SARS Coronavirus 2 by RT PCR: NEGATIVE

## 2022-10-20 LAB — AMMONIA: Ammonia: 31 umol/L (ref 9–35)

## 2022-10-20 MED ORDER — DIAZEPAM 5 MG/ML IJ SOLN
2.5000 mg | Freq: Four times a day (QID) | INTRAMUSCULAR | Status: DC | PRN
  Administered 2022-10-20 – 2022-10-24 (×3): 2.5 mg via INTRAVENOUS
  Filled 2022-10-20 (×4): qty 2

## 2022-10-20 MED ORDER — FUROSEMIDE 10 MG/ML IJ SOLN
80.0000 mg | Freq: Two times a day (BID) | INTRAMUSCULAR | Status: DC
  Administered 2022-10-20 – 2022-10-25 (×11): 80 mg via INTRAVENOUS
  Filled 2022-10-20 (×11): qty 8

## 2022-10-20 MED ORDER — LACTULOSE 10 GM/15ML PO SOLN: 30.0000 g | Freq: Three times a day (TID) | ORAL | Status: DC

## 2022-10-20 MED ORDER — LACTULOSE 10 GM/15ML PO SOLN
30.0000 g | Freq: Four times a day (QID) | ORAL | Status: DC
  Administered 2022-10-20 – 2022-10-23 (×13): 30 g via ORAL
  Filled 2022-10-20 (×12): qty 60

## 2022-10-20 MED ORDER — IOHEXOL 9 MG/ML PO SOLN
ORAL | Status: AC
  Filled 2022-10-20: qty 1000

## 2022-10-20 NOTE — TOC Initial Note (Addendum)
Transition of Care [facility]) - Initial/Assessment Note    Patient Details  Name: Daniel Crawford MRN: [id number] Date of Birth: 1955/06/07  Transition of Care [facility]) CM/SW Contact:    Leitha Bleak, RN Phone Number: 10/20/2022, 2:16 PM  Clinical Narrative:       Patient readmitted from 10 day ago, assessed and high risk for readmission. Per chart  he lives alone and has been independent in completing his ADLs. Pt is able to drive to appointments when needed.  Has a cane and walker to use when needed in the home. TOC set up HHPT with [facility]. CHF education added to AVS. TOC following, patient my need RN for education.    Expected Discharge Plan: Home w Home Health Services Barriers to Discharge: Continued Medical Work up   Patient Goals and CMS Choice   Expected Discharge Plan and Services       Living arrangements for the past 2 months: Apartment                  Prior Living Arrangements/Services Living arrangements for the past 2 months: Apartment Lives with:: Self          Activities of Daily Living Home Assistive Devices/Equipment: Cane (specify quad or straight) ADL Screening (condition at time of admission) Patient's cognitive ability adequate to safely complete daily activities?: Yes Is the patient deaf or have difficulty hearing?: No Does the patient have difficulty seeing, even when wearing glasses/contacts?: No Does the patient have difficulty concentrating, remembering, or making decisions?: No Patient able to express need for assistance with ADLs?: Yes Does the patient have difficulty dressing or bathing?: Yes Independently performs ADLs?: Yes (appropriate for developmental age) Does the patient have difficulty walking or climbing stairs?: Yes Weakness of Legs: Both Weakness of Arms/Hands: None  Permission Sought/Granted   Emotional Assessment       Orientation: : Oriented to Place, Oriented to  Time, Oriented to Situation, Oriented to Self Alcohol / Substance  Use: Not Applicable Psych Involvement: No (comment)  Admission diagnosis:  Chronic hyponatremia [E87.1] Anasarca [R60.1] Thrombocytopenia (HCC) [D69.6] Chronic liver failure without hepatic coma (HCC) [K72.10] Patient Active Problem List   Diagnosis Date Noted   Acute hepatic encephalopathy (HCC) 10/20/2022   Acute exacerbation of congestive heart failure (HCC) 10/03/2022   Acute pancreatitis 10/02/2022   Hepatic encephalopathy (HCC) 07/10/2022   T2DM (type 2 diabetes mellitus) (HCC) 07/06/2022   CKD stage 3a, GFR 45-59 ml/min (HCC) 07/06/2022   Umbilical hernia 07/05/2022   Decompensated liver disease (HCC) 07/04/2022   Morbid obesity (HCC) 05/25/2022   Decompensation of cirrhosis of liver (HCC) 05/21/2022   Pancytopenia (HCC) 05/21/2022   Acute exacerbation of CHF (congestive heart failure) (HCC) 04/06/2022   Liver cirrhosis secondary to NASH (HCC) 03/13/2022   Iron deficiency anemia 02/28/2022   Hypoalbuminemia due to protein-calorie malnutrition (HCC) 02/28/2022   GERD (gastroesophageal reflux disease) 02/28/2022   Abdominal pain 02/28/2022   Anasarca 01/23/2022   Generalized weakness 01/23/2022   Essential hypertension 01/23/2022   Volume overload 01/23/2022   Hypervolemia    Elevated TSH 01/22/2022   D-dimer, elevated 01/22/2022   Protein calorie malnutrition (HCC) 01/22/2022   Chronic diastolic heart failure (HCC)    Hypogonadism, male 12/26/2021   History of colonic polyps 07/27/2021   Auditory hallucinations 07/27/2021   Leukopenia 04/19/2021   Hypokalemia 04/18/2021   Cellulitis of left leg 03/26/2021   Dyspnea on exertion 01/20/2020   OSA (obstructive sleep apnea) 01/20/2020   History of ascites 10/22/2019  NASH (nonalcoholic steatohepatitis) 06/19/2019   Other ascites 06/19/2019   Acquired hypothyroidism 10/16/2016   Cirrhosis of liver with ascites (HCC) 10/16/2016   Depression with anxiety 10/16/2016   Thrombocytopenia (HCC) 10/16/2016   Spleen enlarged  10/16/2016   NAFLD (nonalcoholic fatty liver disease) 40/98/1191   Polyp of ascending colon 02/28/2016   Type 2 diabetes mellitus (HCC) 02/28/2016   Class 3 severe obesity in adult [facility]) 02/28/2016   PCP:  Elmer Picker [facility]:   [id number] - [place], [place] - [address] AT [facility] [address] [place] [address] Phone: [phone] Fax: [phone]     Social Determinants of Health (SDOH) Social History: SDOH Screenings   Food Insecurity: No Food Insecurity (10/19/2022)  Housing: Low Risk  (10/19/2022)  Transportation Needs: No Transportation Needs (10/19/2022)  Utilities: Not At Risk (10/19/2022)  Depression (PHQ2-9): Medium Risk (10/27/2019)  Financial Resource Strain: Low Risk  (03/28/2021)   Received from [facility]  Tobacco Use: Medium Risk (10/19/2022)   SDOH Interventions:     Readmission Risk Interventions    10/20/2022    2:15 PM 10/04/2022    9:36 AM 07/11/2022   12:24 PM  Readmission Risk Prevention Plan  Transportation Screening Complete Complete Complete  Medication Review (RN Care Manager) Complete Complete Complete  PCP or Specialist appointment within 3-5 days of discharge Not Complete    HRI or Home Care Consult Complete Complete Complete  SW Recovery Care/Counseling Consult Complete Complete Complete  Palliative Care Screening Not Applicable Not Applicable --  Skilled Nursing Facility Not Applicable Not Applicable Not Applicable

## 2022-10-20 NOTE — Hospital Course (Signed)
67 year old male with a history of diabetes mellitus type 2, hypertension, NASH liver cirrhosis, hypothyroidism, CKD stage III, pancytopenia, depression/anxiety, OSA presenting with shortness of breath and orthopnea type symptoms.  The patient was at [facility] for evaluation of paracentesis.  There was not enough ascites for paracentesis.  However, he had stated that he was having more shortness of breath and dyspnea on exertion and orthopnea.  As result, the patient was taken the emergency department for further evaluation and treatment.  He endorses compliance with all his medications at home.  However, he notes increasing edema and abdominal swelling. Notably, the patient was recently mated to the hospital from 10/02/2022 to 10/05/2022 for fluid overload.  He was discharged home with torsemide 60 mg twice daily.  He denies any fevers, chills, chest pain, nausea, vomiting, diarrhea.  There is no hematochezia or melena. In the ED, the patient had low-grade temperature 100.4 F.  Oxygen saturation was 97-100% room air.  WBC 5.8, hemoglobin 10.1, platelets 76,000.  Sodium 135, potassium 3.7, bicarbonate 30, serum creatinine 1.56.  AST 42, ALT 21, alk phosphatase 83, total bilirubin 6.0.  Chest x-ray was negative for any acute findings.  Patient was started on IV furosemide.  In the morning of 10/20/2022, the patient was somewhat slow to respond to questions.  As result, there was concern for hepatic encephalopathy.  Ammonia level was checked.  His lactulose dose was increased.

## 2022-10-20 NOTE — Plan of Care (Signed)
Pt alert and oriented x 4. Up with 1 assist due to hx of falls during previous admissions. Fall mat placed. Oxy given x 1 since admission to floor. Albumin given. Vitals stable. Daily weight. Malewick in place due to strict intake and output and incontinence.  Problem: Education: Goal: Ability to demonstrate management of disease process will improve Outcome: Progressing Goal: Ability to verbalize understanding of medication therapies will improve Outcome: Progressing Goal: Individualized Educational Video(s) Outcome: Progressing   Problem: Activity: Goal: Capacity to carry out activities will improve Outcome: Progressing   Problem: Cardiac: Goal: Ability to achieve and maintain adequate cardiopulmonary perfusion will improve Outcome: Progressing   Problem: Education: Goal: Knowledge of General Education information will improve Description: Including pain rating scale, medication(s)/side effects and non-pharmacologic comfort measures Outcome: Progressing   Problem: Health Behavior/Discharge Planning: Goal: Ability to manage health-related needs will improve Outcome: Progressing   Problem: Clinical Measurements: Goal: Ability to maintain clinical measurements within normal limits will improve Outcome: Progressing Goal: Will remain free from infection Outcome: Progressing Goal: Diagnostic test results will improve Outcome: Progressing Goal: Respiratory complications will improve Outcome: Progressing Goal: Cardiovascular complication will be avoided Outcome: Progressing   Problem: Activity: Goal: Risk for activity intolerance will decrease Outcome: Progressing   Problem: Nutrition: Goal: Adequate nutrition will be maintained Outcome: Progressing   Problem: Coping: Goal: Level of anxiety will decrease Outcome: Progressing   Problem: Elimination: Goal: Will not experience complications related to bowel motility Outcome: Progressing Goal: Will not experience  complications related to urinary retention Outcome: Progressing   Problem: Pain Managment: Goal: General experience of comfort will improve Outcome: Progressing   Problem: Safety: Goal: Ability to remain free from injury will improve Outcome: Progressing   Problem: Skin Integrity: Goal: Risk for impaired skin integrity will decrease Outcome: Progressing

## 2022-10-20 NOTE — Progress Notes (Signed)
Patient has complained of pain all shift in abdomen and back he has also be yelling out "bless me lord, please help me". When I go into room and ask patient why he is yelling out his response is, he didn't know he was doing that. I contacted provider and provider has been up to patient several times this shift and CT abdomen was completed. PRN medications have been given has directed and labs obtained as ordered.

## 2022-10-20 NOTE — Progress Notes (Signed)
PROGRESS NOTE  Daniel Crawford WUJ:[id number] DOB: 1955/06/01 DOA: 10/19/2022 PCP: Elmer Picker [facility]  Brief History:  67 year old male with a history of diabetes mellitus type 2, hypertension, NASH liver cirrhosis, hypothyroidism, CKD stage III, pancytopenia, depression/anxiety, OSA presenting with shortness of breath and orthopnea type symptoms.  The patient was at [facility] for evaluation of paracentesis.  There was not enough ascites for paracentesis.  However, he had stated that he was having more shortness of breath and dyspnea on exertion and orthopnea.  As result, the patient was taken the emergency department for further evaluation and treatment.  He endorses compliance with all his medications at home.  However, he notes increasing edema and abdominal swelling. Notably, the patient was recently mated to the hospital from 10/02/2022 to 10/05/2022 for fluid overload.  He was discharged home with torsemide 60 mg twice daily.  He denies any fevers, chills, chest pain, nausea, vomiting, diarrhea.  There is no hematochezia or melena. In the ED, the patient had low-grade temperature 100.4 F.  Oxygen saturation was 97-100% room air.  WBC 5.8, hemoglobin 10.1, platelets 76,000.  Sodium 135, potassium 3.7, bicarbonate 30, serum creatinine 1.56.  AST 42, ALT 21, alk phosphatase 83, total bilirubin 6.0.  Chest x-ray was negative for any acute findings.  Patient was started on IV furosemide.  In the morning of 10/20/2022, the patient was somewhat slow to respond to questions.  As result, there was concern for hepatic encephalopathy.  Ammonia level was checked.  His lactulose dose was increased.   Assessment/Plan: Decompensated liver cirrhosis with anasarca -Increase IV furosemide to 80 IV bid -many inconsistencies regarding how he takes torsemide at home -suspect a degree of dietary/fluid indiscretion contributing to his frequent decompensations -Low-sodium diet -Daily  weights -Patient is not felt to be a TIPS or transplant candidate secondary to comorbidities -continue rifaximin -previously evaluated by [facility] for liver transplant, though not a candidate due to his BMI, multi-morbidities.    Acute metabolic encephalopathy/hepatic encephalopathy -10/20/22 AM--confused, slow to respond -check ammonia -increase lactulose to 30 g hrs  -continue rifaximin -UA -check COVID -d/c oxycodone  CKD 3a -baseline creatinine 1.2-1.6 -monitor with diuresis   Pancytopenia -due to liver cirrhosis -monitor for signs of bleeding   Diabetes mellitus type 2 -not on any agents at home -check A1C--5.1   Essential hypertension Holding coreg temporarily to allow BP margin for diuresis   Hypothyroidism Continue Synthroid   Depression/anxiety Continue Zoloft/hydroxyzine  Morbid Obesity -BMI 52.17 -lifestyle modification           Family Communication: no  Family at bedside  Consultants:  none  Code Status:  FULL   DVT Prophylaxis:  SCDs   Procedures: As Listed in Progress Note Above  Antibiotics: None       Subjective: Patient is confused.  Review of systems is limited.  He denies any chest pain, short breath, nausea, vomiting, diarrhea.  He has some upper abdominal pain.  He denies any headache or coughing.  Objective: Vitals:   10/19/22 2157 10/20/22 0233 10/20/22 0439 10/20/22 0546  BP: 118/63 129/61  133/62  Pulse: 81 87  91  Resp: 20 20  20   Temp: 98.4 F (36.9 C) 98.3 F (36.8 C)  (!) 100.4 F (38 C)  TempSrc: Oral Oral  Oral  SpO2: 97% 100%  94%  Weight: (!) 184.4 kg  (!) 184.3 kg   Height:  Intake/Output Summary (Last 24 hours) at 10/20/2022 0843 Last data filed at 10/20/2022 0234 Gross per 24 hour  Intake 100 ml  Output 500 ml  Net -400 ml   Weight change:  Exam:  General:  Pt is alert, follows commands appropriately, not in acute distress HEENT: No icterus, No thrush, No neck mass,  [place]/AT Cardiovascular: RRR, S1/S2, no rubs, no gallops Respiratory: Bibasal crackles.  No wheeze Abdomen: Soft/+BS, non tender, non distended, no guarding Extremities: 2  +LE edema, No lymphangitis, No petechiae, No rashes, no synovitis   Data Reviewed: I have personally reviewed following labs and imaging studies Basic Metabolic Panel: Recent Labs  Lab 10/19/22 1323 10/19/22 1415 10/20/22 0501  NA 133* 132* 135  K 3.8 3.5 3.7  CL 94* 94* 95*  CO2 31 30 30   GLUCOSE 216* 224* 123*  BUN 19 19 20   CREATININE 1.61* 1.71* 1.56*  CALCIUM 8.2* 8.1* 8.3*   Liver Function Tests: Recent Labs  Lab 10/19/22 1323 10/19/22 1415 10/20/22 0501  AST 47* 48* 42*  ALT 23 22 21   ALKPHOS 89 91 83  BILITOT 5.7* 5.3* 6.0*  PROT 6.1* 6.2* 6.3*  ALBUMIN 2.5* 2.5* 2.8*   No results for input(s): "LIPASE", "AMYLASE" in the last 168 hours. No results for input(s): "AMMONIA" in the last 168 hours. Coagulation Profile: Recent Labs  Lab 10/19/22 1323  INR 1.6*   CBC: Recent Labs  Lab 10/19/22 1323 10/19/22 1415  WBC 5.8 5.4  NEUTROABS 4.2  --   HGB 10.1* 9.9*  HCT 30.8* 30.9*  MCV 91.1 91.7  PLT 76* 76*   Cardiac Enzymes: No results for input(s): "CKTOTAL", "CKMB", "CKMBINDEX", "TROPONINI" in the last 168 hours. BNP: Invalid input(s): "POCBNP" CBG: Recent Labs  Lab 10/19/22 2255  GLUCAP 145*   HbA1C: No results for input(s): "HGBA1C" in the last 72 hours. Urine analysis:    Component Value Date/Time   COLORURINE YELLOW 10/02/2022 1732   APPEARANCEUR CLEAR 10/02/2022 1732   LABSPEC >1.046 (H) 10/02/2022 1732   PHURINE 5.0 10/02/2022 1732   GLUCOSEU NEGATIVE 10/02/2022 1732   HGBUR SMALL (A) 10/02/2022 1732   BILIRUBINUR NEGATIVE 10/02/2022 1732   KETONESUR NEGATIVE 10/02/2022 1732   PROTEINUR NEGATIVE 10/02/2022 1732   NITRITE NEGATIVE 10/02/2022 1732   LEUKOCYTESUR NEGATIVE 10/02/2022 1732   Sepsis Labs: @LABRCNTIP (procalcitonin:4,lacticidven:4) )No results found  for this or any previous visit (from the past 240 hour(s)).   Scheduled Meds:  enoxaparin (LOVENOX) injection  90 mg Subcutaneous Q24H   furosemide  80 mg Intravenous BID   insulin aspart  0-15 Units Subcutaneous TID WC   insulin aspart  0-5 Units Subcutaneous QHS   lactose free nutrition  237 mL Oral QPM   lactulose  30 g Oral Q6H   levothyroxine  175 mcg Oral QAC breakfast   pantoprazole  40 mg Oral Daily   potassium chloride SA  40 mEq Oral BID   rifaximin  550 mg Oral BID   sertraline  100 mg Oral Daily   spironolactone  100 mg Oral Daily   traZODone  50 mg Oral QHS   Continuous Infusions:  albumin human 25 g (10/20/22 0434)    Procedures/Studies: DG Chest 2 View  Result Date: 10/19/2022 CLINICAL DATA:  Shortness of breath. EXAM: CHEST - 2 VIEW COMPARISON:  September 15, 2022. FINDINGS: The heart size and mediastinal contours are within normal limits. Both lungs are clear. The visualized skeletal structures are unremarkable. IMPRESSION: No active cardiopulmonary disease. Electronically Signed  By: Lupita Raider M.D.   On: 10/19/2022 15:08   [place] ASCITES (ABDOMEN LIMITED)  Result Date: 10/19/2022 CLINICAL DATA:  History of cirrhosis and ascites. Evaluation for possible paracentesis. EXAM: LIMITED ABDOMEN ULTRASOUND FOR ASCITES TECHNIQUE: Limited ultrasound survey for ascites was performed in all four abdominal quadrants. COMPARISON:  CT of the abdomen and pelvis on 10/02/2022 FINDINGS: Four quadrant survey of the peritoneal cavity demonstrates no significant ascites. Paracentesis was therefore not performed. IMPRESSION: No significant ascites. Paracentesis was not performed. Electronically Signed   By: Irish Lack M.D.   On: 10/19/2022 14:04   CT ABDOMEN PELVIS W CONTRAST  Result Date: 10/02/2022 CLINICAL DATA:  Left lower abdominal pain, nausea EXAM: CT ABDOMEN AND PELVIS WITH CONTRAST TECHNIQUE: Multidetector CT imaging of the abdomen and pelvis was performed using the standard  protocol following bolus administration of intravenous contrast. RADIATION DOSE REDUCTION: This exam was performed according to the departmental dose-optimization program which includes automated exposure control, adjustment of the mA and/or kV according to patient size and/or use of iterative reconstruction technique. CONTRAST:  OMNIPAQUE IOHEXOL 300 MG/ML  SOLN COMPARISON:  01/22/2022 FINDINGS: Lower chest: Small right pleural effusion, new since previous. No pneumothorax. Visualized lung bases clear. Hepatobiliary: Nodular hepatic contour suggesting cirrhosis. No focal liver lesion or biliary ductal dilatation. Cholecystectomy clips. Portal vein patent. Pancreas: No mass or ductal dilatation. Increase in retroperitoneal inflammatory/edematous changes predominately posterior and inferior to the pancreas. No pseudocyst. Spleen: Splenomegaly 21.4 cm craniocaudal length. No focal lesion. Relatively small splenorenal venous shunt. Adrenals/Urinary Tract: No adrenal mass. Symmetric renal parenchymal enhancement. 4 mm right lower pole renal calculus. No hydronephrosis. Urinary bladder is nondistended. Stomach/Bowel: Core small esophageal varices. The stomach is nondistended, without acute finding. Small bowel nondilated. Normal appendix. Colon incompletely distended, with suggestion of circumferential wall thickening in multiple segments, focal lesion evident. Vascular/Lymphatic: No AAA. Portal vein patent. Small esophageal varices. IVC patent. Reproductive: Prostate is unremarkable. Other: Small volume predominantly perihepatic ascites.  No free air. Musculoskeletal: Body wall anasarca most marked anteriorly. Moderate paraumbilical hernia containing only mesenteric fat and vessels, no bowel. Sutures of laparoscopic left inguinal hernia repair. Mild multilevel spondylitic changes in the lumbar spine. IMPRESSION: 1. Nonspecific retroperitoneal inflammatory/edematous changes predominately posterior and inferior to  the pancreas. Correlate with amylase and lipase to exclude pancreatitis. 2. Cirrhosis with splenomegaly, small volume ascites, and small esophageal varices. 3. Small right pleural effusion, new since previous. 4. Nonobstructive right nephrolithiasis. 5. Moderate paraumbilical hernia containing mesenteric fat and vessels, no bowel. Electronically Signed   By: Corlis Leak M.D.   On: 10/02/2022 16:51   US Abdomen Limited RUQ (LIVER/GB)  Result Date: 09/21/2022 CLINICAL DATA:  Cirrhosis.  HCC screening. EXAM: ULTRASOUND ABDOMEN LIMITED RIGHT UPPER QUADRANT COMPARISON:  March 22, 2022 ultrasound. FINDINGS: Gallbladder: Surgically absent Common bile duct: Diameter: 3.9 mm Liver: Increased heterogeneous echogenicity. Mildly nodular contour. No liver mass identified. Portal vein is patent on color Doppler imaging with normal direction of blood flow towards the liver. Other: None. IMPRESSION: 1. Cirrhotic liver. No liver mass identified. 2. The gallbladder is surgically absent. Electronically Signed   By: Gerome Sam III M.D.   On: 09/21/2022 11:51    Catarina Hartshorn, DO  [facility]  If 7PM-7AM, please contact night-coverage [email or web address] Password [facility] 10/20/2022, 8:43 AM   LOS: 1 day

## 2022-10-21 ENCOUNTER — Inpatient Hospital Stay (HOSPITAL_COMMUNITY): Payer: Medicare HMO

## 2022-10-21 DIAGNOSIS — R188 Other ascites: Secondary | ICD-10-CM

## 2022-10-21 DIAGNOSIS — K7460 Unspecified cirrhosis of liver: Secondary | ICD-10-CM

## 2022-10-21 DIAGNOSIS — D696 Thrombocytopenia, unspecified: Secondary | ICD-10-CM | POA: Diagnosis not present

## 2022-10-21 DIAGNOSIS — E871 Hypo-osmolality and hyponatremia: Secondary | ICD-10-CM

## 2022-10-21 LAB — FOLATE: Folate: 15.1 ng/mL (ref >5.9)

## 2022-10-21 LAB — BLOOD GAS, VENOUS
Acid-Base Excess: 9.7 mmol/L — ABNORMAL HIGH (ref 0.0–2.0)
Bicarbonate: 34.3 mmol/L — ABNORMAL HIGH (ref 20.0–28.0)
Drawn by: 66460
O2 Saturation: 73.3 %
Patient temperature: 37.8
pCO2, Ven: 47 mmHg (ref 44–60)
pH, Ven: 7.48 — ABNORMAL HIGH (ref 7.25–7.43)
pO2, Ven: 42 mmHg (ref 32–45)

## 2022-10-21 LAB — CBC
HCT: 28.5 % — ABNORMAL LOW (ref 39.0–52.0)
Hemoglobin: 9.2 g/dL — ABNORMAL LOW (ref 13.0–17.0)
MCH: 29.8 pg (ref 26.0–34.0)
MCHC: 32.3 g/dL (ref 30.0–36.0)
MCV: 92.2 fL (ref 80.0–100.0)
Platelets: 61 10*3/uL — ABNORMAL LOW (ref 150–400)
RBC: 3.09 MIL/uL — ABNORMAL LOW (ref 4.22–5.81)
RDW: 18.2 % — ABNORMAL HIGH (ref 11.5–15.5)
WBC: 8 10*3/uL (ref 4.0–10.5)
nRBC: 0 % (ref 0.0–0.2)

## 2022-10-21 LAB — AMMONIA: Ammonia: 35 umol/L (ref 9–35)

## 2022-10-21 LAB — COMPREHENSIVE METABOLIC PANEL WITH GFR
ALT: 20 U/L (ref 0–44)
AST: 41 U/L (ref 15–41)
Albumin: 2.9 g/dL — ABNORMAL LOW (ref 3.5–5.0)
Alkaline Phosphatase: 82 U/L (ref 38–126)
Anion gap: 10 (ref 5–15)
BUN: 22 mg/dL (ref 8–23)
CO2: 28 mmol/L (ref 22–32)
Calcium: 8.4 mg/dL — ABNORMAL LOW (ref 8.9–10.3)
Chloride: 94 mmol/L — ABNORMAL LOW (ref 98–111)
Creatinine, Ser: 1.61 mg/dL — ABNORMAL HIGH (ref 0.61–1.24)
GFR, Estimated: 47 mL/min — ABNORMAL LOW (ref >60)
Glucose, Bld: 131 mg/dL — ABNORMAL HIGH (ref 70–99)
Potassium: 4.1 mmol/L (ref 3.5–5.1)
Sodium: 132 mmol/L — ABNORMAL LOW (ref 135–145)
Total Bilirubin: 9 mg/dL — ABNORMAL HIGH (ref 0.3–1.2)
Total Protein: 6.2 g/dL — ABNORMAL LOW (ref 6.5–8.1)

## 2022-10-21 LAB — MAGNESIUM: Magnesium: 1.9 mg/dL (ref 1.7–2.4)

## 2022-10-21 LAB — VITAMIN B12: Vitamin B-12: 1139 pg/mL — ABNORMAL HIGH (ref 180–914)

## 2022-10-21 LAB — LACTIC ACID, PLASMA: Lactic Acid, Venous: 2.5 mmol/L (ref 0.5–1.9)

## 2022-10-21 LAB — PROCALCITONIN: Procalcitonin: 1.68 ng/mL

## 2022-10-21 LAB — T4, FREE: Free T4: 0.99 ng/dL (ref 0.61–1.12)

## 2022-10-21 LAB — TSH: TSH: 8.858 u[IU]/mL — ABNORMAL HIGH (ref 0.350–4.500)

## 2022-10-21 LAB — LIPASE, BLOOD: Lipase: 43 U/L (ref 11–51)

## 2022-10-21 MED ORDER — LACTATED RINGERS IV BOLUS
1000.0000 mL | Freq: Once | INTRAVENOUS | Status: AC
  Administered 2022-10-21: 1000 mL via INTRAVENOUS

## 2022-10-21 MED ORDER — ALBUMIN HUMAN 25 % IV SOLN
25.0000 g | Freq: Once | INTRAVENOUS | Status: AC
  Administered 2022-10-21: 25 g via INTRAVENOUS
  Filled 2022-10-21: qty 100

## 2022-10-21 MED ORDER — SODIUM CHLORIDE 0.9 % IV SOLN: 1.5000 g | Freq: Four times a day (QID) | INTRAVENOUS | Status: DC

## 2022-10-21 MED ORDER — BISACODYL 10 MG RE SUPP
10.0000 mg | Freq: Once | RECTAL | Status: AC
  Administered 2022-10-21: 10 mg via RECTAL
  Filled 2022-10-21: qty 1

## 2022-10-21 MED ORDER — MAGNESIUM SULFATE IN D5W 1-5 GM/100ML-% IV SOLN
1.0000 g | Freq: Once | INTRAVENOUS | Status: AC
  Administered 2022-10-21: 1 g via INTRAVENOUS
  Filled 2022-10-21: qty 100

## 2022-10-21 MED ORDER — SODIUM CHLORIDE 0.9 % IV SOLN
3.0000 g | Freq: Four times a day (QID) | INTRAVENOUS | Status: DC
  Administered 2022-10-21 – 2022-10-23 (×8): 3 g via INTRAVENOUS
  Filled 2022-10-21 (×9): qty 8

## 2022-10-21 NOTE — Progress Notes (Signed)
Pt has c/o pain in legs most of this shift. Pt continues to yell, "Lord help me". Pt has been anxious and medicated with valium x 2. Per MD, pt can not get more anxiety medication d/t risk of over sedation.

## 2022-10-21 NOTE — Plan of Care (Signed)
  Problem: Elimination: Goal: Will not experience complications related to bowel motility Outcome: Progressing   Problem: Skin Integrity: Goal: Risk for impaired skin integrity will decrease Outcome: Progressing   

## 2022-10-21 NOTE — Progress Notes (Addendum)
PROGRESS NOTE  Daniel Crawford FIE:[id number] DOB: Sep 28, 1955 DOA: 10/19/2022 PCP: [facility]  Brief History:  67 year old male with a history of diabetes mellitus type 2, hypertension, NASH liver cirrhosis, hypothyroidism, CKD stage III, pancytopenia, depression/anxiety, OSA presenting with shortness of breath and orthopnea type symptoms.  The patient was at [facility] for evaluation of paracentesis.  There was not enough ascites for paracentesis.  However, he had stated that he was having more shortness of breath and dyspnea on exertion and orthopnea.  As result, the patient was taken the emergency department for further evaluation and treatment.  He endorses compliance with all his medications at home.  However, he notes increasing edema and abdominal swelling. Notably, the patient was recently mated to the hospital from 10/02/2022 to 10/05/2022 for fluid overload.  He was discharged home with torsemide 60 mg twice daily.  He denies any fevers, chills, chest pain, nausea, vomiting, diarrhea.  There is no hematochezia or melena. In the ED, the patient had low-grade temperature 100.4 F.  Oxygen saturation was 97-100% room air.  WBC 5.8, hemoglobin 10.1, platelets 76,000.  Sodium 135, potassium 3.7, bicarbonate 30, serum creatinine 1.56.  AST 42, ALT 21, alk phosphatase 83, total bilirubin 6.0.  Chest x-ray was negative for any acute findings.  Patient was started on IV furosemide.  In the morning of 10/20/2022, the patient was somewhat slow to respond to questions.  As result, there was concern for hepatic encephalopathy.  Ammonia level was checked.  His lactulose dose was increased to q 6hrs  The patient developed low-grade fever up to 101.1 F.  In addition, the patient did not have any bowel movements with increased dose of Lasix.  CT of the abdomen and pelvis was obtained and showed mild ascites.  There is mesenteric congestion and body wall anasarca similar to  10/02/2022.  There was mildly dilated lower abdominal small bowel loops.  There was circumferential thickening of the ascending colon felt to be a portal colopathy versus colitis.  The patient continued to remain confused.   Assessment/Plan: Decompensated liver cirrhosis with anasarca -Increase IV furosemide to 80 IV bid -NEG 2 L in last 24 hours -many inconsistencies regarding how he takes torsemide at home -suspect a degree of dietary/fluid indiscretion contributing to his frequent decompensations -Low-sodium diet -Daily weights -Patient is not felt to be a TIPS or transplant candidate secondary to comorbidities -continue rifaximin -previously evaluated by [facility] for liver transplant, though not a candidate due to his BMI, multi-morbidities.     Acute metabolic encephalopathy/hepatic encephalopathy -10/21/22 AM--remains confused -check ammonia 31>>35 -increased lactulose to 30 q 6  hrs  -continue rifaximin -UA--neg for pyuria -check COVID--neg -d/c oxycodone -CT brain -VBG -B12 -TSH 8.858, Free T4 0.99>>euthyroid sick  Ileus -no BM with lactulose -10/20/22 CT abd--mesenteric congestion and body wall anasarca similar to 10/02/2022.  There was mildly dilated lower abdominal small bowel loops.  There was circumferential thickening of the ascending colon felt to be a portal colopathy versus colitis.  -add bisacodyl supp -KUB in am  Portal HTN colopathy -cannot rule out colitis on CT -start Unasyn   CKD 3a -baseline creatinine 1.2-1.6 -monitor with diuresis   Pancytopenia -due to liver cirrhosis -monitor for signs of bleeding   Diabetes mellitus type 2 -not on any agents at home -check A1C--5.1   Essential hypertension Holding coreg temporarily to allow BP margin for diuresis   Hypothyroidism Continue Synthroid  Depression/anxiety Continue Zoloft/hydroxyzine   Morbid Obesity -BMI 52.17 -lifestyle modification                   Family Communication: advocate  and brother updated 7/28   Consultants:  none   Code Status:  FULL    DVT Prophylaxis:  SCDs     Procedures: As Listed in Progress Note Above   Antibiotics: Unaysn 7/28>>        Subjective: Patient remains confused.  He denies any chest pain, shortness breath, nausea, vomiting.  He has abdominal pain.  Denies any headache, neck pain.  He has not had a bowel movement the last 24 hours.  There is no dysuria or hematuria.  Objective: Vitals:   10/20/22 1329 10/20/22 1500 10/20/22 2007 10/21/22 0359  BP: 130/69 (!) 141/70 (!) 134/59 (!) 127/54  Pulse: 84  99 91  Resp: 20  18 16   Temp: 99.8 F (37.7 C) 100 F (37.8 C) 100.3 F (37.9 C) (!) 101.1 F (38.4 C)  TempSrc: Oral  Oral Oral  SpO2: 97% 96% 91% 98%  Weight:    (!) 171.2 kg  Height:        Intake/Output Summary (Last 24 hours) at 10/21/2022 1306 Last data filed at 10/21/2022 1223 Gross per 24 hour  Intake 1200 ml  Output 3350 ml  Net -2150 ml   Weight change: -13.4 kg Exam:  General:  Pt is alert, follows commands appropriately, not in acute distress; pleasantly confused HEENT: No icterus, No thrush, No neck mass, [place]/AT Cardiovascular: RRR, S1/S2, no rubs, no gallops Respiratory: Bibasilar crackles but no wheezing Abdomen: Soft/+BS, mild upper abdomen tender, non distended, no guarding Extremities: 2 + LE edema, No lymphangitis, No petechiae, No rashes, no synovitis   Data Reviewed: I have personally reviewed following labs and imaging studies Basic Metabolic Panel: Recent Labs  Lab 10/19/22 1323 10/19/22 1415 10/20/22 0501 10/21/22 0459  NA 133* 132* 135 132*  K 3.8 3.5 3.7 4.1  CL 94* 94* 95* 94*  CO2 31 30 30 28   GLUCOSE 216* 224* 123* 131*  BUN 19 19 20 22   CREATININE 1.61* 1.71* 1.56* 1.61*  CALCIUM 8.2* 8.1* 8.3* 8.4*  MG  --   --   --  1.9   Liver Function Tests: Recent Labs  Lab 10/19/22 1323 10/19/22 1415 10/20/22 0501 10/21/22 0459  AST 47* 48* 42* 41  ALT 23 22 21 20    ALKPHOS 89 91 83 82  BILITOT 5.7* 5.3* 6.0* 9.0*  PROT 6.1* 6.2* 6.3* 6.2*  ALBUMIN 2.5* 2.5* 2.8* 2.9*   Recent Labs  Lab 10/21/22 0459  LIPASE 43   Recent Labs  Lab 10/20/22 0841 10/21/22 0459  AMMONIA 31 35   Coagulation Profile: Recent Labs  Lab 10/19/22 1323  INR 1.6*   CBC: Recent Labs  Lab 10/19/22 1323 10/19/22 1415 10/21/22 0459  WBC 5.8 5.4 8.0  NEUTROABS 4.2  --   --   HGB 10.1* 9.9* 9.2*  HCT 30.8* 30.9* 28.5*  MCV 91.1 91.7 92.2  PLT 76* 76* 61*   Cardiac Enzymes: No results for input(s): "CKTOTAL", "CKMB", "CKMBINDEX", "TROPONINI" in the last 168 hours. BNP: Invalid input(s): "POCBNP" CBG: Recent Labs  Lab 10/19/22 2255  GLUCAP 145*   HbA1C: No results for input(s): "HGBA1C" in the last 72 hours. Urine analysis:    Component Value Date/Time   COLORURINE YELLOW 10/20/2022 1045   APPEARANCEUR CLEAR 10/20/2022 1045   LABSPEC 1.008 10/20/2022 1045  PHURINE 7.0 10/20/2022 1045   GLUCOSEU NEGATIVE 10/20/2022 1045   HGBUR NEGATIVE 10/20/2022 1045   BILIRUBINUR NEGATIVE 10/20/2022 1045   KETONESUR NEGATIVE 10/20/2022 1045   PROTEINUR NEGATIVE 10/20/2022 1045   NITRITE NEGATIVE 10/20/2022 1045   LEUKOCYTESUR NEGATIVE 10/20/2022 1045   Sepsis Labs: @LABRCNTIP (procalcitonin:4,lacticidven:4) ) Recent Results (from the past 240 hour(s))  Culture, blood (Routine X 2) w Reflex to ID Panel     Status: None (Preliminary result)   Collection Time: 10/20/22  9:13 AM   Specimen: Right Antecubital; Blood  Result Value Ref Range Status   Specimen Description   Final    RIGHT ANTECUBITAL BOTTLES DRAWN AEROBIC AND ANAEROBIC   Special Requests Blood Culture adequate volume  Final   Culture   Final    NO GROWTH 1 DAY Performed at [facility], [address], [place], [place] [address]    Report Status PENDING  Incomplete  Culture, blood (Routine X 2) w Reflex to ID Panel     Status: None (Preliminary result)   Collection Time: 10/20/22  9:13 AM    Specimen: BLOOD RIGHT HAND  Result Value Ref Range Status   Specimen Description   Final    BLOOD RIGHT HAND BOTTLES DRAWN AEROBIC AND ANAEROBIC   Special Requests Blood Culture adequate volume  Final   Culture   Final    NO GROWTH 1 DAY Performed at [facility], [address], [place], [place] [address]    Report Status PENDING  Incomplete  SARS Coronavirus 2 by RT PCR (hospital order, performed in [facility] hospital lab) *cepheid single result test* Anterior Nasal Swab     Status: None   Collection Time: 10/20/22  6:00 PM   Specimen: Anterior Nasal Swab  Result Value Ref Range Status   SARS Coronavirus 2 by RT PCR NEGATIVE NEGATIVE Final    Comment: (NOTE) SARS-CoV-2 target nucleic acids are NOT DETECTED.  The SARS-CoV-2 RNA is generally detectable in upper and lower respiratory specimens during the acute phase of infection. The lowest concentration of SARS-CoV-2 viral copies this assay can detect is 250 copies / mL. A negative result does not preclude SARS-CoV-2 infection and should not be used as the sole basis for treatment or other patient management decisions.  A negative result [place] occur with improper specimen collection / handling, submission of specimen other than nasopharyngeal swab, presence of viral mutation(s) within the areas targeted by this assay, and inadequate number of viral copies (<250 copies / mL). A negative result must be combined with clinical observations, patient history, and epidemiological information.  Fact Sheet for Patients:   RoadLapTop.co.za  Fact Sheet for Healthcare Providers: http://kim-miller.com/  This test is not yet approved or  cleared by the [place] FDA and has been authorized for detection and/or diagnosis of SARS-CoV-2 by FDA under an Emergency Use Authorization (EUA).  This EUA will remain in effect (meaning this test can be used) for the duration of the COVID-19 declaration  under Section 564(b)(1) of the Act, 21 U.S.C. section 360bbb-3(b)(1), unless the authorization is terminated or revoked sooner.  Performed at [facility], [address], [place], [place] [address]      Scheduled Meds:  bisacodyl  10 mg Rectal Once   enoxaparin (LOVENOX) injection  90 mg Subcutaneous Q24H   furosemide  80 mg Intravenous BID   lactose free nutrition  237 mL Oral QPM   lactulose  30 g Oral Q6H   levothyroxine  175 mcg Oral QAC breakfast  pantoprazole  40 mg Oral Daily   potassium chloride SA  40 mEq Oral BID   rifaximin  550 mg Oral BID   sertraline  100 mg Oral Daily   spironolactone  100 mg Oral Daily   Continuous Infusions:  magnesium sulfate bolus IVPB      Procedures/Studies: CT ABDOMEN PELVIS WO CONTRAST  Result Date: 10/20/2022 CLINICAL DATA:  Abdominal pain.  Known liver cirrhosis with ascites. EXAM: CT ABDOMEN AND PELVIS WITHOUT IV CONTRAST ( ORAL CONTRAST ONLY ) TECHNIQUE: Multidetector CT imaging of the abdomen and pelvis was performed following the standard protocol without IV contrast, following oral contrast only. RADIATION DOSE REDUCTION: This exam was performed according to the departmental dose-optimization program which includes automated exposure control, adjustment of the mA and/or kV according to patient size and/or use of iterative reconstruction technique. COMPARISON:  CT with contrast 10/02/2022, CT without contrast 01/22/2022 FINDINGS: Lower chest: Small to moderate-sized right pleural effusion, increased. No left pleural effusion. There is compressive atelectasis in the right lower lobe adjacent the effusion but no lung base infiltrates. Breathing motion limits fine detail.  The cardiac size is normal. Hepatobiliary: The liver is cirrhotic and there is dilatation of the main portal vein which measures 1.9 cm. No mass is seen. Again surgically absent gallbladder without biliary dilatation. Pancreas: No focal abnormality in the unenhanced pancreas.  There are generalized mesenteric congestive changes. Some of the edema is seen posterior and inferior to the pancreas and potentially could indicate evidence of pancreatitis, but this was also seen previously. Correlate with serum lipase for possible significance. Spleen: Enlarged measuring 22 cm length. There are splenorenal varices which were better seen with contrast. No focal parenchymal abnormality. Adrenals/Urinary Tract: There is no adrenal mass. There is a 3 mm nonobstructive caliceal stone in the inferior pole right kidney and occasional punctate nonobstructive caliceal stones on the left. There is no contour deforming abnormality of either kidney. No hydronephrosis or ureteral stone. Unremarkable bladder for the degree of distention. Stomach/Bowel: Somewhat thickened folds in the stomach, could be due to nondistention, gastritis or portal gastropathy. There are mildly dilated mid to lower abdominal small bowel loops up to 3 cm caliber, more normal caliber in distal small bowel with no visible transitional segment. Circumferential thickening is again seen in the ascending colon. Rest of the large bowel wall unremarkable. This could be from colitis or portal colopathy. The appendix is normal caliber. Vascular/Lymphatic: Small varices in the omentum and abdominal wall. Unremarkable unenhanced aorta. Dilated portal vein as described above.  No lymphadenopathy is seen. Reproductive: No prostatomegaly. Other: There is mild patchy ascites in the mesenteric folds, mild upper abdominal and pelvic ascites but no drainable pocket. As above there is generalized mesenteric congestion. Moderate body wall anasarca continues to be seen there is a left inguinal hernia repair. There is a moderate-sized umbilical fat hernia containing scattered fluid in vessels. Musculoskeletal: There is osteopenia and degenerative change of the spine. Severe acquired foraminal stenosis L4-5 and L5-S1. Acquired spinal stenosis L4-5 and  L5-S1. No acute or significant osseous findings. IMPRESSION: 1. Cirrhotic liver with dilated portal vein, splenomegaly, splenorenal varices, and mild ascites. 2. Mesenteric congestion and body wall anasarca, similar to the last CT 10/02/2022. 3. Edema posterior and inferior to the pancreas which could be due to pancreatitis or part of the mesenteric congestive change, but this was also seen previously. Correlate with serum lipase. 4. Mildly dilated mid to lower abdominal small bowel loops with more normal caliber in distal  small bowel. Findings could be due to ileus or partial obstruction. 5. Circumferential thickening of the ascending colon which could be due to colitis or portal colopathy. 6. Somewhat thickened gastric folds which could be due to nondistention, gastritis or portal gastropathy. 7. Small to moderate-sized right pleural effusion, increased. 8. Nonobstructive nephrolithiasis. 9. Moderate-sized umbilical fat hernia containing scattered fluid and vessels. 10. Osteopenia and degenerative change. Severe acquired foraminal and spinal canal stenosis L4-5 and L5-S1. Electronically Signed   By: Almira Bar M.D.   On: 10/20/2022 22:46   DG Chest 2 View  Result Date: 10/19/2022 CLINICAL DATA:  Shortness of breath. EXAM: CHEST - 2 VIEW COMPARISON:  September 15, 2022. FINDINGS: The heart size and mediastinal contours are within normal limits. Both lungs are clear. The visualized skeletal structures are unremarkable. IMPRESSION: No active cardiopulmonary disease. Electronically Signed   By: Lupita Raider M.D.   On: 10/19/2022 15:08   [place] ASCITES (ABDOMEN LIMITED)  Result Date: 10/19/2022 CLINICAL DATA:  History of cirrhosis and ascites. Evaluation for possible paracentesis. EXAM: LIMITED ABDOMEN ULTRASOUND FOR ASCITES TECHNIQUE: Limited ultrasound survey for ascites was performed in all four abdominal quadrants. COMPARISON:  CT of the abdomen and pelvis on 10/02/2022 FINDINGS: Four quadrant survey of the  peritoneal cavity demonstrates no significant ascites. Paracentesis was therefore not performed. IMPRESSION: No significant ascites. Paracentesis was not performed. Electronically Signed   By: Irish Lack M.D.   On: 10/19/2022 14:04   CT ABDOMEN PELVIS W CONTRAST  Result Date: 10/02/2022 CLINICAL DATA:  Left lower abdominal pain, nausea EXAM: CT ABDOMEN AND PELVIS WITH CONTRAST TECHNIQUE: Multidetector CT imaging of the abdomen and pelvis was performed using the standard protocol following bolus administration of intravenous contrast. RADIATION DOSE REDUCTION: This exam was performed according to the departmental dose-optimization program which includes automated exposure control, adjustment of the mA and/or kV according to patient size and/or use of iterative reconstruction technique. CONTRAST:  OMNIPAQUE IOHEXOL 300 MG/ML  SOLN COMPARISON:  01/22/2022 FINDINGS: Lower chest: Small right pleural effusion, new since previous. No pneumothorax. Visualized lung bases clear. Hepatobiliary: Nodular hepatic contour suggesting cirrhosis. No focal liver lesion or biliary ductal dilatation. Cholecystectomy clips. Portal vein patent. Pancreas: No mass or ductal dilatation. Increase in retroperitoneal inflammatory/edematous changes predominately posterior and inferior to the pancreas. No pseudocyst. Spleen: Splenomegaly 21.4 cm craniocaudal length. No focal lesion. Relatively small splenorenal venous shunt. Adrenals/Urinary Tract: No adrenal mass. Symmetric renal parenchymal enhancement. 4 mm right lower pole renal calculus. No hydronephrosis. Urinary bladder is nondistended. Stomach/Bowel: Core small esophageal varices. The stomach is nondistended, without acute finding. Small bowel nondilated. Normal appendix. Colon incompletely distended, with suggestion of circumferential wall thickening in multiple segments, focal lesion evident. Vascular/Lymphatic: No AAA. Portal vein patent. Small esophageal varices. IVC  patent. Reproductive: Prostate is unremarkable. Other: Small volume predominantly perihepatic ascites.  No free air. Musculoskeletal: Body wall anasarca most marked anteriorly. Moderate paraumbilical hernia containing only mesenteric fat and vessels, no bowel. Sutures of laparoscopic left inguinal hernia repair. Mild multilevel spondylitic changes in the lumbar spine. IMPRESSION: 1. Nonspecific retroperitoneal inflammatory/edematous changes predominately posterior and inferior to the pancreas. Correlate with amylase and lipase to exclude pancreatitis. 2. Cirrhosis with splenomegaly, small volume ascites, and small esophageal varices. 3. Small right pleural effusion, new since previous. 4. Nonobstructive right nephrolithiasis. 5. Moderate paraumbilical hernia containing mesenteric fat and vessels, no bowel. Electronically Signed   By: Corlis Leak M.D.   On: 10/02/2022 16:51    Catarina Hartshorn, DO  [facility]  If 7PM-7AM, please contact night-coverage [email or web address] Password [email or web address] 10/21/2022, 1:06 PM   LOS: 2 days

## 2022-10-21 NOTE — Progress Notes (Signed)
Unable to obtain ReDs clip reading on pt. MD notified. Will continue to monitor.

## 2022-10-21 NOTE — Progress Notes (Signed)
Date and time results received: 10/21/22 1437 (use smartphrase ".now" to insert current time)  Test: Lactic Critical Value: 2.5  Name of Provider Notified: Tat

## 2022-10-22 DIAGNOSIS — K7682 Hepatic encephalopathy: Secondary | ICD-10-CM | POA: Diagnosis not present

## 2022-10-22 DIAGNOSIS — K7460 Unspecified cirrhosis of liver: Secondary | ICD-10-CM | POA: Diagnosis not present

## 2022-10-22 DIAGNOSIS — K529 Noninfective gastroenteritis and colitis, unspecified: Secondary | ICD-10-CM | POA: Diagnosis not present

## 2022-10-22 DIAGNOSIS — Z515 Encounter for palliative care: Secondary | ICD-10-CM | POA: Diagnosis not present

## 2022-10-22 DIAGNOSIS — K7290 Hepatic failure, unspecified without coma: Secondary | ICD-10-CM | POA: Diagnosis not present

## 2022-10-22 DIAGNOSIS — Z7189 Other specified counseling: Secondary | ICD-10-CM

## 2022-10-22 DIAGNOSIS — R601 Generalized edema: Secondary | ICD-10-CM | POA: Diagnosis not present

## 2022-10-22 DIAGNOSIS — K7581 Nonalcoholic steatohepatitis (NASH): Secondary | ICD-10-CM

## 2022-10-22 DIAGNOSIS — D696 Thrombocytopenia, unspecified: Secondary | ICD-10-CM | POA: Diagnosis not present

## 2022-10-22 DIAGNOSIS — R188 Other ascites: Secondary | ICD-10-CM | POA: Diagnosis not present

## 2022-10-22 LAB — GLUCOSE, CAPILLARY
Glucose-Capillary: 126 mg/dL — ABNORMAL HIGH (ref 70–99)
Glucose-Capillary: 122 mg/dL — ABNORMAL HIGH (ref 70–99)
Glucose-Capillary: 106 mg/dL — ABNORMAL HIGH (ref 70–99)

## 2022-10-22 LAB — PROTIME-INR
INR: 1.8 — ABNORMAL HIGH (ref 0.8–1.2)
Prothrombin Time: 20.7 seconds — ABNORMAL HIGH (ref 11.4–15.2)

## 2022-10-22 MED ORDER — ENOXAPARIN SODIUM 80 MG/0.8ML IJ SOSY
80.0000 mg | PREFILLED_SYRINGE | INTRAMUSCULAR | Status: DC
  Administered 2022-10-22 – 2022-10-25 (×4): 80 mg via SUBCUTANEOUS
  Filled 2022-10-22 (×4): qty 0.8

## 2022-10-22 NOTE — Consult Note (Signed)
Consultation Note Date: 10/22/2022   Patient Name: Daniel Crawford  DOB: 1955-11-09  MRN: [id number]  Age / Sex: 67 y.o., male  PCP: [facility] Referring Physician: Catarina Hartshorn, MD  Reason for Consultation: Establishing goals of care  HPI/Patient Profile: 67 y.o. male  with past medical history of NASH cirrhosis, diabetes, CKD stage 3, HFpEF, HTN, asthma, OSA, anemia, thrombocytopenia, hypothyroidism, depression admitted on 10/19/2022 with shortness of breath and orthopnea with increased fluid retention secondary to decompensated liver cirrhosis.   Clinical Assessment and Goals of Care: Consult received and chart review completed. Reviewed previous palliative notes as well. I met today with Riyaan and he is more awake and oriented for conversation. Unfortunately he is also short of breath and poor reserve which is limiting to the conversation.   I spoke with Tyr about his goals of care and severity of his liver disease. Regional is consistent in his desire for treatment. He tells me "I do not want hospice - I want treatment." Unfortunately it sounds like Melinda was began on hospice but did not have a clear understanding of what this meant and the type of care they would provide. I reassured him that I was not here to speak about hospice but I am worried about his condition. We discussed his wishes up to now but also acknowledging that he is getting worse and the expectation that he will continue to worsen with time as there is no cure to his progressing liver disease. I spoke with Beren about code status and my concern that resuscitation will not reverse his liver failure but I worry will cause him more pain and suffering at the end of his life. He tells me that he would want "to try." He tells me that he wants everything done to try and get him "stronger." I attempted to explain the limitation of resuscitation  and measures to get him stronger or to a quality of life. We agreed to continue this discussion tomorrow.   Haywood does share with me that he has a Living Will. He tells me that his HCPOAs are his friends Carney Bern and Brett Canales. He is not sure if they have a copy of his Living Will. He gives me permission to call and update them. I attempted to call Carney Bern but unsuccessful and I left her a voicemail requesting return call.   All questions/concerns addressed. Emotional support provided.   Primary Decision Maker PATIENT HCPOA: Friends Carney Bern and Brett Canales    SUMMARY OF RECOMMENDATIONS   - Ongoing GOC discussions needed - Needs further discussion/education to expected outcomes of interventions and path forward  Code Status/Advance Care Planning: Full code - further discussion tomorrow   Symptom Management:  Discussed with RN adding some oxygen for comfort while short of breath.   Prognosis:  Prognosis poor. High risk for acute decompensation.   Discharge Planning: To Be Determined      Primary Diagnoses: Present on Admission:  Anasarca  Acquired hypothyroidism  Chronic diastolic heart failure (HCC)  Cirrhosis of liver  with ascites (HCC)  CKD stage 3a, GFR 45-59 ml/min (HCC)  Class 3 severe obesity in adult [facility])  Decompensation of cirrhosis of liver (HCC)  Essential hypertension  Morbid obesity (HCC)  NASH (nonalcoholic steatohepatitis)  OSA (obstructive sleep apnea)   I have reviewed the medical record, interviewed the patient and family, and examined the patient. The following aspects are pertinent.  Past Medical History:  Diagnosis Date   Anemia    Anxiety    Asthma    CHF (congestive heart failure) (HCC)    CKD (chronic kidney disease)    Depression    Diabetes mellitus without complication (HCC)    Dyspnea    Hypertension    Hypothyroidism    Liver cirrhosis secondary to NASH (HCC)    NASH (nonalcoholic steatohepatitis)    Pre-diabetes    Spleen enlarged     Thrombocytopenia (HCC)    Social History   Socioeconomic History   Marital status: Single    Spouse name: Not on file   Number of children: Not on file   Years of education: Not on file   Highest education level: Not on file  Occupational History   Not on file  Tobacco Use   Smoking status: Former    Current packs/day: 0.00    Average packs/day: 1 pack/day for 20.0 years (20.0 ttl pk-yrs)    Types: Cigarettes    Start date: 57    Quit date: 2017    Years since quitting: 7.5    Passive exposure: Current   Smokeless tobacco: Never  Vaping Use   Vaping status: Never Used  Substance and Sexual Activity   Alcohol use: No   Drug use: No   Sexual activity: Not on file  Other Topics Concern   Not on file  Social History Narrative   Not on file   Social Determinants of Health   Financial Resource Strain: Low Risk  (03/28/2021)   [facility]   Overall Financial Resource Strain (CARDIA)    Difficulty of Paying Living Expenses: Not hard at all  Food Insecurity: No Food Insecurity (10/19/2022)   Hunger Vital Sign    Worried About Running Out of Food in the Last Year: Never true    Ran Out of Food in the Last Year: Never true  Transportation Needs: No Transportation Needs (10/19/2022)   PRAPARE - Administrator, Civil Service (Medical): No    Lack of Transportation (Non-Medical): No  Physical Activity: Not on file  Stress: Not on file  Social Connections: Not on file   Family History  Problem Relation Age of Onset   Liver cancer Mother    Heart disease Mother    Aneurysm Sister    Scheduled Meds:  enoxaparin (LOVENOX) injection  80 mg Subcutaneous Q24H   furosemide  80 mg Intravenous BID   lactose free nutrition  237 mL Oral QPM   lactulose  30 g Oral Q6H   levothyroxine  175 mcg Oral QAC breakfast   pantoprazole  40 mg Oral Daily   potassium chloride SA  40 mEq Oral BID   rifaximin  550 mg Oral BID   sertraline  100 mg  Oral Daily   spironolactone  100 mg Oral Daily   Continuous Infusions:  ampicillin-sulbactam (UNASYN) IV 3 g (10/22/22 0624)   PRN Meds:.albuterol, diazepam, gabapentin, ondansetron No Known Allergies Review of Systems  Constitutional:  Positive for activity change, appetite change and fatigue.  Respiratory:  Positive for shortness of breath.   Gastrointestinal:  Positive for abdominal distention and diarrhea.  Neurological:  Positive for weakness.    Physical Exam Vitals and nursing note reviewed.  Constitutional:      Appearance: He is ill-appearing.     Comments: Anasarca  Cardiovascular:     Rate and Rhythm: Normal rate.  Pulmonary:     Effort: Accessory muscle usage present.     Comments: Poor reserve Abdominal:     General: There is distension.  Neurological:     Mental Status: He is alert and oriented to person, place, and time.  Psychiatric:        Mood and Affect: Mood is anxious.     Vital Signs: BP (!) 131/54 (BP Location: Left Arm)   Pulse 93   Temp 98.9 F (37.2 C)   Resp 20   Ht 6\' 2"  (1.88 m)   Wt (!) 179.2 kg   SpO2 95%   BMI 50.72 kg/m  Pain Scale: Faces POSS *See Group Information*: S-Acceptable,Sleep, easy to arouse Pain Score: 8    SpO2: SpO2: 95 % O2 Device:SpO2: 95 % O2 Flow Rate: .   IO: Intake/output summary:  Intake/Output Summary (Last 24 hours) at 10/22/2022 1259 Last data filed at 10/22/2022 1244 Gross per 24 hour  Intake 1248.52 ml  Output 1800 ml  Net -551.48 ml    LBM: Last BM Date : 10/21/22 Baseline Weight: Weight: (!) 184.6 kg Most recent weight: Weight: (!) 179.2 kg     Palliative Assessment/Data:     Time Total: 75 min  Greater than 50%  of this time was spent counseling and coordinating care related to the above assessment and plan.  Signed by: Yong Channel, NP Palliative Medicine Team Pager # [phone] (M-F 8a-5p) Team Phone # [phone] (Nights/Weekends)

## 2022-10-22 NOTE — Progress Notes (Addendum)
PROGRESS NOTE  Daniel Crawford YQI:[id number] DOB: Oct 12, 1955 DOA: 10/19/2022 PCP: Elmer P[facility]  Brief History:  67 year old male with a history of diabetes mellitus type 2, hypertension, NASH liver cirrhosis, hypothyroidism, CKD stage III, pancytopenia, depression/anxiety, OSA presenting with shortness of breath and orthopnea type symptoms.  The patient was at [facility] for evaluation of paracentesis.  There was not enough ascites for paracentesis.  However, he had stated that he was having more shortness of breath and dyspnea on exertion and orthopnea.  As result, the patient was taken the emergency department for further evaluation and treatment.  He endorses compliance with all his medications at home.  However, he notes increasing edema and abdominal swelling. Notably, the patient was recently mated to the hospital from 10/02/2022 to 10/05/2022 for fluid overload.  He was discharged home with torsemide 60 mg twice daily.  He denies any fevers, chills, chest pain, nausea, vomiting, diarrhea.  There is no hematochezia or melena. In the ED, the patient had low-grade temperature 100.4 F.  Oxygen saturation was 97-100% room air.  WBC 5.8, hemoglobin 10.1, platelets 76,000.  Sodium 135, potassium 3.7, bicarbonate 30, serum creatinine 1.56.  AST 42, ALT 21, alk phosphatase 83, total bilirubin 6.0.  Chest x-ray was negative for any acute findings.  Patient was started on IV furosemide.  In the morning of 10/20/2022, the patient was somewhat slow to respond to questions.  As result, there was concern for hepatic encephalopathy.  Ammonia level was checked.  His lactulose dose was increased to q 6hrs  The patient developed low-grade fever up to 101.1 F.  In addition, the patient did not have any bowel movements with increased dose of Lasix.  CT of the abdomen and pelvis was obtained and showed mild ascites.  There is mesenteric congestion and body wall anasarca similar to  10/02/2022.  There was mildly dilated lower abdominal small bowel loops.  There was circumferential thickening of the ascending colon felt to be a portal colopathy versus colitis.  The patient continued to remain confused.   Assessment/Plan: Decompensated liver cirrhosis with anasarca -Increase IV furosemide to 80 IV bid -NEG 2 L in last 24 hours -many inconsistencies regarding how he takes torsemide at home -suspect a degree of dietary/fluid indiscretion contributing to his frequent decompensations -Low-sodium diet -Daily weights -Patient is not felt to be a TIPS or transplant candidate secondary to comorbidities -continue rifaximin -previously evaluated by [facility] for liver transplant, though not a candidate due to his BMI, multi-morbidities.     Acute metabolic encephalopathy/hepatic encephalopathy -10/21/22 AM--remains confused -check ammonia 31>>35 -increased lactulose to 30 q 6  hrs >>now 5 BMs in last 24 hours -7/29--more lucid -continue rifaximin -UA--neg for pyuria -check COVID--neg -d/c oxycodone -CT brain--neg -VBG--7.48/47/42/34 -B12--1139 -folate 15.1 -TSH 8.858, Free T4 0.99>>euthyroid sick   Ileus -no BM with lactulose first 48 hour>>>now 5 BMs in last 24 hours -10/20/22 CT abd--mesenteric congestion and body wall anasarca similar to 10/02/2022.  There was mildly dilated lower abdominal small bowel loops.  There was circumferential thickening of the ascending colon felt to be a portal colopathy versus colitis.  -added bisacodyl supp   Portal HTN colopathy -cannot rule out colitis on CT -started Unasyn empirically   CKD 3a -baseline creatinine 1.2-1.6 -monitor with diuresis   Pancytopenia -due to liver cirrhosis -monitor for signs of bleeding   Diabetes mellitus type 2 -not on any agents at home -check A1C--5.1  Essential hypertension Holding coreg temporarily to allow BP margin for diuresis   Hypothyroidism Continue Synthroid    Depression/anxiety Continue Zoloft/hydroxyzine   Morbid Obesity -BMI 52.17 -lifestyle modification                   Family Communication: advocate and brother updated 7/28   Consultants:  none   Code Status:  FULL    DVT Prophylaxis:  SCDs     Procedures: As Listed in Progress Note Above   Antibiotics: Unaysn 7/28>>      Subjective: Pt is more lucid today.  Denies f/c, cp, sob, abd pain. N/v  Objective: Vitals:   10/21/22 0359 10/21/22 1357 10/21/22 1956 10/22/22 0601  BP: (!) 127/54 (!) 114/53 (!) 122/52 (!) 131/54  Pulse: 91 84 95 93  Resp: 16 20 20 20   Temp: (!) 101.1 F (38.4 C) 100 F (37.8 C)  98.9 F (37.2 C)  TempSrc: Oral Oral    SpO2: 98% 95% 95% 95%  Weight: (!) 171.2 kg   (!) 179.2 kg  Height:        Intake/Output Summary (Last 24 hours) at 10/22/2022 1529 Last data filed at 10/22/2022 1244 Gross per 24 hour  Intake 1248.52 ml  Output 1800 ml  Net -551.48 ml   Weight change: 8 kg Exam:  General:  Pt is alert, follows commands appropriately, not in acute distress HEENT: No icterus, No thrush, No neck mass, [place]/AT Cardiovascular: RRR, S1/S2, no rubs, no gallops Respiratory: bibasilar crackles.  No wheeze Abdomen: Soft/+BS, non tender, non distended, no guarding Extremities: 2 + LE edema, No lymphangitis, No petechiae, No rashes, no synovitis   Data Reviewed: I have personally reviewed following labs and imaging studies Basic Metabolic Panel: Recent Labs  Lab 10/19/22 1323 10/19/22 1415 10/20/22 0501 10/21/22 0459 10/22/22 0508  NA 133* 132* 135 132* 131*  K 3.8 3.5 3.7 4.1 4.5  CL 94* 94* 95* 94* 95*  CO2 31 30 30 28 26   GLUCOSE 216* 224* 123* 131* 150*  BUN 19 19 20 22  25*  CREATININE 1.61* 1.71* 1.56* 1.61* 1.55*  CALCIUM 8.2* 8.1* 8.3* 8.4* 8.1*  MG  --   --   --  1.9 1.9   Liver Function Tests: Recent Labs  Lab 10/19/22 1323 10/19/22 1415 10/20/22 0501 10/21/22 0459 10/22/22 0508  AST 47* 48* 42* 41 40   ALT 23 22 21 20 21   ALKPHOS 89 91 83 82 75  BILITOT 5.7* 5.3* 6.0* 9.0* 9.1*  PROT 6.1* 6.2* 6.3* 6.2* 5.9*  ALBUMIN 2.5* 2.5* 2.8* 2.9* 2.8*   Recent Labs  Lab 10/21/22 0459  LIPASE 43   Recent Labs  Lab 10/20/22 0841 10/21/22 0459  AMMONIA 31 35   Coagulation Profile: Recent Labs  Lab 10/19/22 1323  INR 1.6*   CBC: Recent Labs  Lab 10/19/22 1323 10/19/22 1415 10/21/22 0459 10/22/22 0508  WBC 5.8 5.4 8.0 9.0  NEUTROABS 4.2  --   --   --   HGB 10.1* 9.9* 9.2* 9.0*  HCT 30.8* 30.9* 28.5* 28.5*  MCV 91.1 91.7 92.2 92.8  PLT 76* 76* 61* 57*   Cardiac Enzymes: No results for input(s): "CKTOTAL", "CKMB", "CKMBINDEX", "TROPONINI" in the last 168 hours. BNP: Invalid input(s): "POCBNP" CBG: Recent Labs  Lab 10/19/22 2255 10/20/22 0748 10/20/22 1201 10/20/22 1709  GLUCAP 145* 126* 122* 106*   HbA1C: No results for input(s): "HGBA1C" in the last 72 hours. Urine analysis:    Component Value Date/Time  COLORURINE YELLOW 10/20/2022 1045   APPEARANCEUR CLEAR 10/20/2022 1045   LABSPEC 1.008 10/20/2022 1045   PHURINE 7.0 10/20/2022 1045   GLUCOSEU NEGATIVE 10/20/2022 1045   HGBUR NEGATIVE 10/20/2022 1045   BILIRUBINUR NEGATIVE 10/20/2022 1045   KETONESUR NEGATIVE 10/20/2022 1045   PROTEINUR NEGATIVE 10/20/2022 1045   NITRITE NEGATIVE 10/20/2022 1045   LEUKOCYTESUR NEGATIVE 10/20/2022 1045   Sepsis Labs: @LABRCNTIP (procalcitonin:4,lacticidven:4) ) Recent Results (from the past 240 hour(s))  Culture, blood (Routine X 2) w Reflex to ID Panel     Status: None (Preliminary result)   Collection Time: 10/20/22  9:13 AM   Specimen: Right Antecubital; Blood  Result Value Ref Range Status   Specimen Description   Final    RIGHT ANTECUBITAL BOTTLES DRAWN AEROBIC AND ANAEROBIC   Special Requests Blood Culture adequate volume  Final   Culture   Final    NO GROWTH 2 DAYS Performed at [facility], [address], [place], [place] [address]    Report Status  PENDING  Incomplete  Culture, blood (Routine X 2) w Reflex to ID Panel     Status: None (Preliminary result)   Collection Time: 10/20/22  9:13 AM   Specimen: BLOOD RIGHT HAND  Result Value Ref Range Status   Specimen Description   Final    BLOOD RIGHT HAND BOTTLES DRAWN AEROBIC AND ANAEROBIC   Special Requests Blood Culture adequate volume  Final   Culture   Final    NO GROWTH 2 DAYS Performed at [facility], [address], [place], [place] [address]    Report Status PENDING  Incomplete  SARS Coronavirus 2 by RT PCR (hospital order, performed in [facility] hospital lab) *cepheid single result test* Anterior Nasal Swab     Status: None   Collection Time: 10/20/22  6:00 PM   Specimen: Anterior Nasal Swab  Result Value Ref Range Status   SARS Coronavirus 2 by RT PCR NEGATIVE NEGATIVE Final    Comment: (NOTE) SARS-CoV-2 target nucleic acids are NOT DETECTED.  The SARS-CoV-2 RNA is generally detectable in upper and lower respiratory specimens during the acute phase of infection. The lowest concentration of SARS-CoV-2 viral copies this assay can detect is 250 copies / mL. A negative result does not preclude SARS-CoV-2 infection and should not be used as the sole basis for treatment or other patient management decisions.  A negative result Kief occur with improper specimen collection / handling, submission of specimen other than nasopharyngeal swab, presence of viral mutation(s) within the areas targeted by this assay, and inadequate number of viral copies (<250 copies / mL). A negative result must be combined with clinical observations, patient history, and epidemiological information.  Fact Sheet for Patients:   RoadLapTop.co.za  Fact Sheet for Healthcare Providers: http://kim-miller.com/  This test is not yet approved or  cleared by the [place] FDA and has been authorized for detection and/or diagnosis of SARS-CoV-2 by FDA under  an Emergency Use Authorization (EUA).  This EUA will remain in effect (meaning this test can be used) for the duration of the COVID-19 declaration under Section 564(b)(1) of the Act, 21 U.S.C. section 360bbb-3(b)(1), unless the authorization is terminated or revoked sooner.  Performed at [facility], [address], [place], [place] [address]      Scheduled Meds:  enoxaparin (LOVENOX) injection  80 mg Subcutaneous Q24H   furosemide  80 mg Intravenous BID   lactose free nutrition  237 mL Oral QPM   lactulose  30 g Oral Q6H  levothyroxine  175 mcg Oral QAC breakfast   pantoprazole  40 mg Oral Daily   potassium chloride SA  40 mEq Oral BID   rifaximin  550 mg Oral BID   sertraline  100 mg Oral Daily   spironolactone  100 mg Oral Daily   Continuous Infusions:  ampicillin-sulbactam (UNASYN) IV 3 g (10/22/22 1437)    Procedures/Studies: CT HEAD WO CONTRAST ( )  Result Date: 10/21/2022 CLINICAL DATA:  Delirium, altered level of consciousness EXAM: CT HEAD WITHOUT CONTRAST TECHNIQUE: Contiguous axial images were obtained from the base of the skull through the vertex without intravenous contrast. RADIATION DOSE REDUCTION: This exam was performed according to the departmental dose-optimization program which includes automated exposure control, adjustment of the mA and/or kV according to patient size and/or use of iterative reconstruction technique. COMPARISON:  08/22/2021 FINDINGS: Brain: No acute infarct or hemorrhage. Lateral ventricles and midline structures are stable. No acute extra-axial fluid collections. No mass effect. Vascular: No hyperdense vessel or unexpected calcification. Skull: Normal. Negative for fracture or focal lesion. Sinuses/Orbits: No acute finding. Other: None. IMPRESSION: 1. No acute intracranial process. Electronically Signed   By: Sharlet Salina M.D.   On: 10/21/2022 16:37   CT ABDOMEN PELVIS WO CONTRAST  Result Date: 10/20/2022 CLINICAL DATA:  Abdominal pain.   Known liver cirrhosis with ascites. EXAM: CT ABDOMEN AND PELVIS WITHOUT IV CONTRAST ( ORAL CONTRAST ONLY ) TECHNIQUE: Multidetector CT imaging of the abdomen and pelvis was performed following the standard protocol without IV contrast, following oral contrast only. RADIATION DOSE REDUCTION: This exam was performed according to the departmental dose-optimization program which includes automated exposure control, adjustment of the mA and/or kV according to patient size and/or use of iterative reconstruction technique. COMPARISON:  CT with contrast 10/02/2022, CT without contrast 01/22/2022 FINDINGS: Lower chest: Small to moderate-sized right pleural effusion, increased. No left pleural effusion. There is compressive atelectasis in the right lower lobe adjacent the effusion but no lung base infiltrates. Breathing motion limits fine detail.  The cardiac size is normal. Hepatobiliary: The liver is cirrhotic and there is dilatation of the main portal vein which measures 1.9 cm. No mass is seen. Again surgically absent gallbladder without biliary dilatation. Pancreas: No focal abnormality in the unenhanced pancreas. There are generalized mesenteric congestive changes. Some of the edema is seen posterior and inferior to the pancreas and potentially could indicate evidence of pancreatitis, but this was also seen previously. Correlate with serum lipase for possible significance. Spleen: Enlarged measuring 22 cm length. There are splenorenal varices which were better seen with contrast. No focal parenchymal abnormality. Adrenals/Urinary Tract: There is no adrenal mass. There is a 3 mm nonobstructive caliceal stone in the inferior pole right kidney and occasional punctate nonobstructive caliceal stones on the left. There is no contour deforming abnormality of either kidney. No hydronephrosis or ureteral stone. Unremarkable bladder for the degree of distention. Stomach/Bowel: Somewhat thickened folds in the stomach, could be due  to nondistention, gastritis or portal gastropathy. There are mildly dilated mid to lower abdominal small bowel loops up to 3 cm caliber, more normal caliber in distal small bowel with no visible transitional segment. Circumferential thickening is again seen in the ascending colon. Rest of the large bowel wall unremarkable. This could be from colitis or portal colopathy. The appendix is normal caliber. Vascular/Lymphatic: Small varices in the omentum and abdominal wall. Unremarkable unenhanced aorta. Dilated portal vein as described above.  No lymphadenopathy is seen. Reproductive: No prostatomegaly. Other: There is mild patchy  ascites in the mesenteric folds, mild upper abdominal and pelvic ascites but no drainable pocket. As above there is generalized mesenteric congestion. Moderate body wall anasarca continues to be seen there is a left inguinal hernia repair. There is a moderate-sized umbilical fat hernia containing scattered fluid in vessels. Musculoskeletal: There is osteopenia and degenerative change of the spine. Severe acquired foraminal stenosis L4-5 and L5-S1. Acquired spinal stenosis L4-5 and L5-S1. No acute or significant osseous findings. IMPRESSION: 1. Cirrhotic liver with dilated portal vein, splenomegaly, splenorenal varices, and mild ascites. 2. Mesenteric congestion and body wall anasarca, similar to the last CT 10/02/2022. 3. Edema posterior and inferior to the pancreas which could be due to pancreatitis or part of the mesenteric congestive change, but this was also seen previously. Correlate with serum lipase. 4. Mildly dilated mid to lower abdominal small bowel loops with more normal caliber in distal small bowel. Findings could be due to ileus or partial obstruction. 5. Circumferential thickening of the ascending colon which could be due to colitis or portal colopathy. 6. Somewhat thickened gastric folds which could be due to nondistention, gastritis or portal gastropathy. 7. Small to  moderate-sized right pleural effusion, increased. 8. Nonobstructive nephrolithiasis. 9. Moderate-sized umbilical fat hernia containing scattered fluid and vessels. 10. Osteopenia and degenerative change. Severe acquired foraminal and spinal canal stenosis L4-5 and L5-S1. Electronically Signed   By: Almira Bar M.D.   On: 10/20/2022 22:46   DG Chest 2 View  Result Date: 10/19/2022 CLINICAL DATA:  Shortness of breath. EXAM: CHEST - 2 VIEW COMPARISON:  September 15, 2022. FINDINGS: The heart size and mediastinal contours are within normal limits. Both lungs are clear. The visualized skeletal structures are unremarkable. IMPRESSION: No active cardiopulmonary disease. Electronically Signed   By: Lupita Raider M.D.   On: 10/19/2022 15:08   [place] ASCITES (ABDOMEN LIMITED)  Result Date: 10/19/2022 CLINICAL DATA:  History of cirrhosis and ascites. Evaluation for possible paracentesis. EXAM: LIMITED ABDOMEN ULTRASOUND FOR ASCITES TECHNIQUE: Limited ultrasound survey for ascites was performed in all four abdominal quadrants. COMPARISON:  CT of the abdomen and pelvis on 10/02/2022 FINDINGS: Four quadrant survey of the peritoneal cavity demonstrates no significant ascites. Paracentesis was therefore not performed. IMPRESSION: No significant ascites. Paracentesis was not performed. Electronically Signed   By: Irish Lack M.D.   On: 10/19/2022 14:04   CT ABDOMEN PELVIS W CONTRAST  Result Date: 10/02/2022 CLINICAL DATA:  Left lower abdominal pain, nausea EXAM: CT ABDOMEN AND PELVIS WITH CONTRAST TECHNIQUE: Multidetector CT imaging of the abdomen and pelvis was performed using the standard protocol following bolus administration of intravenous contrast. RADIATION DOSE REDUCTION: This exam was performed according to the departmental dose-optimization program which includes automated exposure control, adjustment of the mA and/or kV according to patient size and/or use of iterative reconstruction technique. CONTRAST:   OMNIPAQUE IOHEXOL 300 MG/ML  SOLN COMPARISON:  01/22/2022 FINDINGS: Lower chest: Small right pleural effusion, new since previous. No pneumothorax. Visualized lung bases clear. Hepatobiliary: Nodular hepatic contour suggesting cirrhosis. No focal liver lesion or biliary ductal dilatation. Cholecystectomy clips. Portal vein patent. Pancreas: No mass or ductal dilatation. Increase in retroperitoneal inflammatory/edematous changes predominately posterior and inferior to the pancreas. No pseudocyst. Spleen: Splenomegaly 21.4 cm craniocaudal length. No focal lesion. Relatively small splenorenal venous shunt. Adrenals/Urinary Tract: No adrenal mass. Symmetric renal parenchymal enhancement. 4 mm right lower pole renal calculus. No hydronephrosis. Urinary bladder is nondistended. Stomach/Bowel: Core small esophageal varices. The stomach is nondistended, without acute finding. Small  bowel nondilated. Normal appendix. Colon incompletely distended, with suggestion of circumferential wall thickening in multiple segments, focal lesion evident. Vascular/Lymphatic: No AAA. Portal vein patent. Small esophageal varices. IVC patent. Reproductive: Prostate is unremarkable. Other: Small volume predominantly perihepatic ascites.  No free air. Musculoskeletal: Body wall anasarca most marked anteriorly. Moderate paraumbilical hernia containing only mesenteric fat and vessels, no bowel. Sutures of laparoscopic left inguinal hernia repair. Mild multilevel spondylitic changes in the lumbar spine. IMPRESSION: 1. Nonspecific retroperitoneal inflammatory/edematous changes predominately posterior and inferior to the pancreas. Correlate with amylase and lipase to exclude pancreatitis. 2. Cirrhosis with splenomegaly, small volume ascites, and small esophageal varices. 3. Small right pleural effusion, new since previous. 4. Nonobstructive right nephrolithiasis. 5. Moderate paraumbilical hernia containing mesenteric fat and vessels, no bowel.  Electronically Signed   By: Corlis Leak M.D.   On: 10/02/2022 16:51    Catarina Hartshorn, DO  [facility]  If 7PM-7AM, please contact night-coverage [email or web address] Password [email or web address] 10/22/2022, 3:29 PM   LOS: 3 days

## 2022-10-22 NOTE — Plan of Care (Signed)
  Problem: Education: Goal: Ability to verbalize understanding of medication therapies will improve Outcome: Progressing   Problem: Education: Goal: Knowledge of General Education information will improve Description: Including pain rating scale, medication(s)/side effects and non-pharmacologic comfort measures Outcome: Progressing   Problem: Nutrition: Goal: Adequate nutrition will be maintained Outcome: Progressing   Problem: Coping: Goal: Level of anxiety will decrease Outcome: Progressing

## 2022-10-22 NOTE — Consult Note (Signed)
Gastroenterology Consult   Referring Provider: Dr. Arbutus Leas  Primary Care Physician:  [facility] Primary Gastroenterologist:  Dr. Levon Hedger  Patient ID: Daniel Crawford; [id number]; 1955/09/27   Admit date: 10/19/2022  LOS: 3 days   Date of Consultation: 10/22/2022  Reason for Consultation:  Colonic wall thickening   History of Present Illness   Daniel Crawford is a 67 y.o. 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 He had been seen outpatient in the [facility] clinic 7/25 with [place] para ordered; however, there was no significant ascites. Limited options from a GI standpoint, as he is not a liver transplant candidate due to BMI. He has been followed by Palliative care as outpatient and actually had transitioned to hospice; however, he told me at visit last week that he requested to come off Hospice and continue with Palliative route instead. He was admitted with decompensated liver cirrhosis with anasarca, concerns for acute HE, and CT with multiple findings as noted below. GI consulted due to colonic wall thickening seen on CT.   CT abd/pelvis without contrast noted cirrhosis, mild ascites, edema posterior and inferior to the pancreas but normal lipase and not consistent with pancreatitis, mildly dilated mid to lower abdominal small bowel loops (query ileus or partial obstruction), circumferential thickening of ascending colon which could be due to colitis or portal colopathy, possibly gastritis vs portal gastropathy, small to moderate-sized right pleural effusion increased, umbilical fat hernia.    Ultimately, he is due for EGD for variceal screening and early interval colonoscopy as cecal polyp note removed last April. Pulmonary has deemed him moderate to high risk for respiratory failure. He  is a poor candidate for anesthesia.   He denies any abdominal pain. No N/V. States he presented to the ED due to worsening edema and SOB. He is on 60-80 mg torsemide in mornings and afternoon, spironlactone 100 mg daily. Not taking metalozone as this over-diuresed him and he is concerned about taking this. He has been taking lactulose and having 3-4 BMs per day prior to admission, Xifaxan BID. He feels fatigued.   Large loose BM today documented around 10am. Admitting weight 407 and today 395. Weight yesterday 377; unclear if this is accurate. Has received rounds of IV albumin. Diuresing.      Last EGD: 11/06/2019 - Normal esophagus. Dilated. - Portal hypertensive gastropathy. - Normal examined duodenum. - No specimens collected.   Last Colonoscopy:06/28/2021  - One medium size sessile polyp in the cecum. This polyp could not be removed because of poor approach. - Five 4 to 7 mm polyps in the transverse colon and at the hepatic flexure, removed with a cold snare. Resected and retrieved. - External and internal hemorrhoids.   Path: All polyps were tubular adenomas  Past Medical History:  Diagnosis Date   Anemia    Anxiety    Asthma    CHF (congestive heart failure) (HCC)    CKD (chronic kidney disease)    Depression    Diabetes mellitus without complication (HCC)    Dyspnea    Hypertension    Hypothyroidism    Liver cirrhosis secondary to NASH (HCC)    NASH (nonalcoholic steatohepatitis)    Pre-diabetes    Spleen enlarged    Thrombocytopenia (HCC)     Past Surgical History:  Procedure Laterality Date   Bilateral  hernia surgery     2006, 2007   CHOLECYSTECTOMY     2016    COLONOSCOPY WITH PROPOFOL N/A 06/28/2021   Procedure: COLONOSCOPY WITH PROPOFOL;  Surgeon: Malissa Hippo, MD;  [facility];  Service: Endoscopy;  Laterality: N/A;  1020   ESOPHAGEAL DILATION N/A 11/06/2019   Procedure: ESOPHAGEAL DILATION;  Surgeon: Dolores Frame, MD;   Location: [facility];  Service: Gastroenterology;  Laterality: N/A;   ESOPHAGOGASTRODUODENOSCOPY (EGD) WITH PROPOFOL N/A 11/06/2019   Procedure: ESOPHAGOGASTRODUODENOSCOPY (EGD) WITH PROPOFOL;  Surgeon: Dolores Frame, MD;  Location: [facility];  Service: Gastroenterology;  Laterality: N/A;  1045   IR RADIOLOGIST EVAL & MGMT  02/19/2022   LAPAROSCOPIC ASSISTED VENTRAL HERNIA REPAIR     Polyp removed     in January of 2018.    POLYPECTOMY  06/28/2021   Procedure: POLYPECTOMY INTESTINAL;  Surgeon: Malissa Hippo, MD;  Location: [facility];  Service: Endoscopy;;   RIGHT HEART CATH N/A 01/08/2022   Procedure: RIGHT HEART CATH;  Surgeon: Corky Crafts, MD;  Location: [facility] INVASIVE CV LAB;  Service: Cardiovascular;  Laterality: N/A;    Prior to Admission medications   Medication Sig Start Date End Date Taking? Authorizing Provider  acetaminophen (TYLENOL) 500 MG tablet Take 1,000 mg by mouth every 6 (six) hours as needed for moderate pain.    [provider]  albuterol (VENTOLIN HFA) 108 (90 Base) MCG/ACT inhaler Inhale 2 puffs into the lungs every 6 (six) hours as needed for wheezing or shortness of breath. 05/01/22   Oretha Milch, MD  carvedilol (COREG) 6.25 MG tablet Take 1 tablet (6.25 mg total) by mouth 2 (two) times daily with a meal. 01/25/22   Emokpae, Courage, MD  gabapentin (NEURONTIN) 100 MG capsule Take 100 mg by mouth daily as needed (pain). 02/20/21   [provider]  hydrOXYzine (VISTARIL) 50 MG capsule Take 1 capsule (50 mg total) by mouth 3 (three) times daily as needed for anxiety or nausea. 01/25/22   Shon Hale, MD  lactose free nutrition (BOOST) LIQD Take 237 mLs by mouth every evening.    [provider]  lactulose (CHRONULAC) 10 GM/15ML solution Take 30 mLs (20 g total) by mouth 2 (two) times daily. 07/13/22   Catarina Hartshorn, MD  levothyroxine (SYNTHROID) 175 MCG tablet Take 175 mcg by mouth daily before breakfast.    [provider]  ondansetron (ZOFRAN) 8 MG tablet Take by mouth every 8 (eight) hours as needed for nausea or vomiting.    [provider]  pantoprazole (PROTONIX) 40 MG tablet Take 1 tablet (40 mg total) by mouth daily. 01/25/22   Shon Hale, MD  potassium chloride SA (KLOR-CON M) 20 MEQ tablet Take 2 tablets (40 mEq total) by mouth 2 (two) times daily. 07/25/22   Laurey Morale, MD  rifaximin (XIFAXAN) 550 MG TABS tablet Take 1 tablet (550 mg total) by mouth 2 (two) times daily. 01/25/22   Shon Hale, MD  sertraline (ZOLOFT) 100 MG tablet Take 100 mg by mouth daily. 09/24/22   [provider]  spironolactone (ALDACTONE) 100 MG tablet Take 1 tablet (100 mg total) by mouth daily. 03/04/22   Johnson, Clanford L, MD  torsemide (DEMADEX) 20 MG tablet Take 40 mg by mouth 2 (two) times daily.    [provider]  traZODone (DESYREL) 50 MG tablet Take 1 tablet (50 mg total) by mouth at bedtime. 01/25/22   Shon Hale, MD  Current Facility-Administered Medications  Medication Dose Route Frequency Provider Last Rate Last Admin   albuterol (PROVENTIL) (2.5 MG/3ML) 0.083% nebulizer solution 3 mL  3 mL Inhalation Q6H PRN Levie Heritage, DO       Ampicillin-Sulbactam (UNASYN) 3 g in sodium chloride 0.9 % 100 mL IVPB  3 g Intravenous Q6H Catarina Hartshorn, MD 200 mL/hr at 10/22/22 0624 3 g at 10/22/22 8295   diazepam (VALIUM) injection 2.5 mg  2.5 mg Intravenous Q6H PRN Tat, Onalee Hua, MD   2.5 mg at 10/21/22 1122   enoxaparin (LOVENOX) injection 90 mg  90 mg Subcutaneous Q24H Levie Heritage, DO   90 mg at 10/21/22 2222   furosemide (LASIX) injection 80 mg  80 mg Intravenous BID Tat, Onalee Hua, MD   80 mg at 10/21/22 1753   gabapentin (NEURONTIN) capsule 100 mg  100 mg Oral Daily PRN Levie Heritage, DO   100 mg at 10/21/22 0433   lactose free nutrition (Boost) liquid 237 mL  237 mL Oral QPM Levie Heritage, DO   237 mL at 10/20/22 1732   lactulose (CHRONULAC) 10 GM/15ML solution  30 g  30 g Oral Q6H Catarina Hartshorn, MD   30 g at 10/22/22 6213   levothyroxine (SYNTHROID) tablet 175 mcg  175 mcg Oral QAC breakfast Levie Heritage, DO   175 mcg at 10/22/22 0865   ondansetron (ZOFRAN) tablet 8 mg  8 mg Oral Q8H PRN Levie Heritage, DO   8 mg at 10/20/22 1504   pantoprazole (PROTONIX) EC tablet 40 mg  40 mg Oral Daily Levie Heritage, DO   40 mg at 10/21/22 7846   potassium chloride SA (KLOR-CON M) CR tablet 40 mEq  40 mEq Oral BID Levie Heritage, DO   40 mEq at 10/21/22 2221   rifaximin (XIFAXAN) tablet 550 mg  550 mg Oral BID Levie Heritage, DO   550 mg at 10/21/22 2221   sertraline (ZOLOFT) tablet 100 mg  100 mg Oral Daily Levie Heritage, DO   100 mg at 10/21/22 9629   spironolactone (ALDACTONE) tablet 100 mg  100 mg Oral Daily Levie Heritage, DO   100 mg at 10/21/22 5284    Allergies as of 10/19/2022   (No Known Allergies)    Family History  Problem Relation Age of Onset   Liver cancer Mother    Heart disease Mother    Aneurysm Sister     Social History   Socioeconomic History   Marital status: Single    Spouse name: Not on file   Number of children: Not on file   Years of education: Not on file   Highest education level: Not on file  Occupational History   Not on file  Tobacco Use   Smoking status: Former    Current packs/day: 0.00    Average packs/day: 1 pack/day for 20.0 years (20.0 ttl pk-yrs)    Types: Cigarettes    Start date: 48    Quit date: 2017    Years since quitting: 7.5    Passive exposure: Current   Smokeless tobacco: Never  Vaping Use   Vaping status: Never Used  Substance and Sexual Activity   Alcohol use: No   Drug use: No   Sexual activity: Not on file  Other Topics Concern   Not on file  Social History Narrative   Not on file   Social Determinants of Health   Financial Resource Strain: Low Risk  (03/28/2021)  [facility]   Overall Financial Resource Strain (CARDIA)     Difficulty of Paying Living Expenses: Not hard at all  Food Insecurity: No Food Insecurity (10/19/2022)   Hunger Vital Sign    Worried About Running Out of Food in the Last Year: Never true    Ran Out of Food in the Last Year: Never true  Transportation Needs: No Transportation Needs (10/19/2022)   PRAPARE - Administrator, Civil Service (Medical): No    Lack of Transportation (Non-Medical): No  Physical Activity: Not on file  Stress: Not on file  Social Connections: Not on file  Intimate Partner Violence: Not At Risk (10/19/2022)   Humiliation, Afraid, Rape, and Kick questionnaire    Fear of Current or Ex-Partner: No    Emotionally Abused: No    Physically Abused: No    Sexually Abused: No     Review of Systems   Gen: Denies any fever, chills, loss of appetite, change in weight or weight loss CV: Denies chest pain, heart palpitations, syncope, edema  Resp: Denies shortness of breath with rest, cough, wheezing, coughing up blood, and pleurisy. GI: Denies vomiting blood, jaundice, and fecal incontinence.   Denies dysphagia or odynophagia. GU : Denies urinary burning, blood in urine, urinary frequency, and urinary incontinence. MS: Denies joint pain, limitation of movement, swelling, cramps, and atrophy.  Derm: Denies rash, itching, dry skin, hives. Psych: Denies depression, anxiety, memory loss, hallucinations, and confusion. Heme: Denies bruising or bleeding Neuro:  Denies any headaches, dizziness, paresthesias, shaking  Physical Exam   Vital Signs in last 24 hours: Temp:  [98.9 F (37.2 C)-100 F (37.8 C)] 98.9 F (37.2 C) (07/29 0601) Pulse Rate:  [84-95] 93 (07/29 0601) Resp:  [20] 20 (07/29 0601) BP: (114-131)/(52-54) 131/54 (07/29 0601) SpO2:  [95 %] 95 % (07/29 0601) Weight:  [179.2 kg] 179.2 kg (07/29 0601) Last BM Date : 10/21/22  General:   Alert,  chronically ill-appearing.  Head:  Normocephalic and atraumatic. Eyes:  +scleral icterus Ears:  Normal  auditory acuity. Lungs:  Clear throughout to auscultation.   Diminished bases Heart:  S1 S2 present Abdomen:  obese with large AP diameter, protruding umbilical hernia, no TTP, diffuse anasarca Rectal: deferred   Extremities:  With 3++ pitting edema to thigh Neuro: oriented X 4, no asterixis  Skin:  Intact without significant lesions or rashes. Psych:  Alert and cooperative. Normal mood and affect.  Intake/Output from previous day: 07/28 0701 - 07/29 0700 In: 1248.5 [P.O.:480; IV Piggyback:768.5] Out: 2000 [Urine:2000] Intake/Output this shift: No intake/output data recorded.   Labs/Studies   Recent Labs Recent Labs    10/19/22 1415 10/21/22 0459 10/22/22 0508  WBC 5.4 8.0 9.0  HGB 9.9* 9.2* 9.0*  HCT 30.9* 28.5* 28.5*  PLT 76* 61* 57*   BMET Recent Labs    10/20/22 0501 10/21/22 0459 10/22/22 0508  NA 135 132* 131*  K 3.7 4.1 4.5  CL 95* 94* 95*  CO2 30 28 26   GLUCOSE 123* 131* 150*  BUN 20 22 25*  CREATININE 1.56* 1.61* 1.55*  CALCIUM 8.3* 8.4* 8.1*   LFT Recent Labs    10/19/22 1415 10/20/22 0501 10/21/22 0459 10/22/22 0508  PROT 6.2* 6.3* 6.2* 5.9*  ALBUMIN 2.5* 2.8* 2.9* 2.8*  AST 48* 42* 41 40  ALT 22 21 20 21   ALKPHOS 91 83 82 75  BILITOT 5.3* 6.0* 9.0* 9.1*  BILIDIR 1.2*  --   --   --  IBILI 4.1*  --   --   --    PT/INR Recent Labs    10/19/22 1323  LABPROT 19.0*  INR 1.6*     Radiology/Studies CT HEAD WO CONTRAST ( )  Result Date: 10/21/2022 CLINICAL DATA:  Delirium, altered level of consciousness EXAM: CT HEAD WITHOUT CONTRAST TECHNIQUE: Contiguous axial images were obtained from the base of the skull through the vertex without intravenous contrast. RADIATION DOSE REDUCTION: This exam was performed according to the departmental dose-optimization program which includes automated exposure control, adjustment of the mA and/or kV according to patient size and/or use of iterative reconstruction technique. COMPARISON:  08/22/2021  FINDINGS: Brain: No acute infarct or hemorrhage. Lateral ventricles and midline structures are stable. No acute extra-axial fluid collections. No mass effect. Vascular: No hyperdense vessel or unexpected calcification. Skull: Normal. Negative for fracture or focal lesion. Sinuses/Orbits: No acute finding. Other: None. IMPRESSION: 1. No acute intracranial process. Electronically Signed   By: Sharlet Salina M.D.   On: 10/21/2022 16:37   CT ABDOMEN PELVIS WO CONTRAST  Result Date: 10/20/2022 CLINICAL DATA:  Abdominal pain.  Known liver cirrhosis with ascites. EXAM: CT ABDOMEN AND PELVIS WITHOUT IV CONTRAST ( ORAL CONTRAST ONLY ) TECHNIQUE: Multidetector CT imaging of the abdomen and pelvis was performed following the standard protocol without IV contrast, following oral contrast only. RADIATION DOSE REDUCTION: This exam was performed according to the departmental dose-optimization program which includes automated exposure control, adjustment of the mA and/or kV according to patient size and/or use of iterative reconstruction technique. COMPARISON:  CT with contrast 10/02/2022, CT without contrast 01/22/2022 FINDINGS: Lower chest: Small to moderate-sized right pleural effusion, increased. No left pleural effusion. There is compressive atelectasis in the right lower lobe adjacent the effusion but no lung base infiltrates. Breathing motion limits fine detail.  The cardiac size is normal. Hepatobiliary: The liver is cirrhotic and there is dilatation of the main portal vein which measures 1.9 cm. No mass is seen. Again surgically absent gallbladder without biliary dilatation. Pancreas: No focal abnormality in the unenhanced pancreas. There are generalized mesenteric congestive changes. Some of the edema is seen posterior and inferior to the pancreas and potentially could indicate evidence of pancreatitis, but this was also seen previously. Correlate with serum lipase for possible significance. Spleen: Enlarged measuring  22 cm length. There are splenorenal varices which were better seen with contrast. No focal parenchymal abnormality. Adrenals/Urinary Tract: There is no adrenal mass. There is a 3 mm nonobstructive caliceal stone in the inferior pole right kidney and occasional punctate nonobstructive caliceal stones on the left. There is no contour deforming abnormality of either kidney. No hydronephrosis or ureteral stone. Unremarkable bladder for the degree of distention. Stomach/Bowel: Somewhat thickened folds in the stomach, could be due to nondistention, gastritis or portal gastropathy. There are mildly dilated mid to lower abdominal small bowel loops up to 3 cm caliber, more normal caliber in distal small bowel with no visible transitional segment. Circumferential thickening is again seen in the ascending colon. Rest of the large bowel wall unremarkable. This could be from colitis or portal colopathy. The appendix is normal caliber. Vascular/Lymphatic: Small varices in the omentum and abdominal wall. Unremarkable unenhanced aorta. Dilated portal vein as described above.  No lymphadenopathy is seen. Reproductive: No prostatomegaly. Other: There is mild patchy ascites in the mesenteric folds, mild upper abdominal and pelvic ascites but no drainable pocket. As above there is generalized mesenteric congestion. Moderate body wall anasarca continues to be seen there is a  left inguinal hernia repair. There is a moderate-sized umbilical fat hernia containing scattered fluid in vessels. Musculoskeletal: There is osteopenia and degenerative change of the spine. Severe acquired foraminal stenosis L4-5 and L5-S1. Acquired spinal stenosis L4-5 and L5-S1. No acute or significant osseous findings. IMPRESSION: 1. Cirrhotic liver with dilated portal vein, splenomegaly, splenorenal varices, and mild ascites. 2. Mesenteric congestion and body wall anasarca, similar to the last CT 10/02/2022. 3. Edema posterior and inferior to the pancreas which  could be due to pancreatitis or part of the mesenteric congestive change, but this was also seen previously. Correlate with serum lipase. 4. Mildly dilated mid to lower abdominal small bowel loops with more normal caliber in distal small bowel. Findings could be due to ileus or partial obstruction. 5. Circumferential thickening of the ascending colon which could be due to colitis or portal colopathy. 6. Somewhat thickened gastric folds which could be due to nondistention, gastritis or portal gastropathy. 7. Small to moderate-sized right pleural effusion, increased. 8. Nonobstructive nephrolithiasis. 9. Moderate-sized umbilical fat hernia containing scattered fluid and vessels. 10. Osteopenia and degenerative change. Severe acquired foraminal and spinal canal stenosis L4-5 and L5-S1. Electronically Signed   By: Almira Bar M.D.   On: 10/20/2022 22:46     Assessment   Adaiah Youkhana is a 67 y.o. 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    Colitis on CT: circumferential thickening of ascending colon I suspect is related to portal colopathy. He has no abdominal pain or concern for infectious process. Agree with empiric IV antibiotics to be thorough. Will need to titrate lactulose to avoid diarrhea but needs at least 3 BMs daily. Clinically, he has more of an ileus presentation instead of partial obstruction.   Decompensated MASH cirrhosis: anasarca has remained an issue now for some time. MELD 3.0 was 26 last week; I have requested INR today to update MELD. No significant ascites on para last Friday. He is not a transplant candidate. AFP tumor marker normal recently. He had been transitioned to Hospice as outpatient, but he then desired to return to the palliative  pathway. Appreciate palliative consultation. At this point, he is in end-stage disease and poor prognosis. Continue to diurese as renal function allows. Oros benefit from additional rounds of IV albumin. Continue weights daily and strict I/0s.    Ultimately, he is due for EGD for variceal screening and early interval colonoscopy as cecal polyp note removed last April. Pulmonary has deemed him moderate to high risk for respiratory failure. He is a poor candidate for anesthesia. Conservative measures recommended at this time.     Plan / Recommendations    Agree with IV antibiotics empirically INR today to calculate MELD 3.0. Daily MELD labs Daily weights, strict I/0s Consider additional rounds of IV albumin Continue with diuresis  Continue lactulose and Xifaxan Appreciate greatly Palliative care consultation     10/22/2022, 7:18 AM  Gelene Mink, PhD, ANP-BC [facility]

## 2022-10-23 DIAGNOSIS — Z7189 Other specified counseling: Secondary | ICD-10-CM | POA: Diagnosis not present

## 2022-10-23 DIAGNOSIS — K7460 Unspecified cirrhosis of liver: Secondary | ICD-10-CM | POA: Diagnosis not present

## 2022-10-23 DIAGNOSIS — I10 Essential (primary) hypertension: Secondary | ICD-10-CM | POA: Diagnosis not present

## 2022-10-23 DIAGNOSIS — K7290 Hepatic failure, unspecified without coma: Secondary | ICD-10-CM | POA: Diagnosis not present

## 2022-10-23 DIAGNOSIS — Z515 Encounter for palliative care: Secondary | ICD-10-CM | POA: Diagnosis not present

## 2022-10-23 DIAGNOSIS — N1831 Chronic kidney disease, stage 3a: Secondary | ICD-10-CM | POA: Diagnosis not present

## 2022-10-23 DIAGNOSIS — L02416 Cutaneous abscess of left lower limb: Secondary | ICD-10-CM

## 2022-10-23 DIAGNOSIS — R601 Generalized edema: Secondary | ICD-10-CM | POA: Diagnosis not present

## 2022-10-23 DIAGNOSIS — L03116 Cellulitis of left lower limb: Secondary | ICD-10-CM

## 2022-10-23 DIAGNOSIS — R188 Other ascites: Secondary | ICD-10-CM | POA: Diagnosis not present

## 2022-10-23 LAB — PROTIME-INR
INR: 1.6 — ABNORMAL HIGH (ref 0.8–1.2)
Prothrombin Time: 19 s — ABNORMAL HIGH (ref 11.4–15.2)

## 2022-10-23 LAB — AMMONIA: Ammonia: 31 umol/L (ref 9–35)

## 2022-10-23 MED ORDER — CEFAZOLIN SODIUM-DEXTROSE 2-4 GM/100ML-% IV SOLN
2.0000 g | Freq: Three times a day (TID) | INTRAVENOUS | Status: DC
  Administered 2022-10-23 – 2022-10-26 (×10): 2 g via INTRAVENOUS
  Filled 2022-10-23 (×11): qty 100

## 2022-10-23 MED ORDER — ALBUMIN HUMAN 25 % IV SOLN
50.0000 g | Freq: Three times a day (TID) | INTRAVENOUS | Status: AC
  Administered 2022-10-23 – 2022-10-26 (×9): 50 g via INTRAVENOUS
  Filled 2022-10-23 (×9): qty 200

## 2022-10-23 MED ORDER — LACTULOSE 10 GM/15ML PO SOLN
30.0000 g | Freq: Two times a day (BID) | ORAL | Status: DC
  Administered 2022-10-24 – 2022-10-26 (×4): 30 g via ORAL
  Filled 2022-10-23 (×6): qty 60

## 2022-10-23 MED ORDER — CAMPHOR-MENTHOL 0.5-0.5 % EX LOTN: TOPICAL_LOTION | CUTANEOUS | Status: DC | PRN

## 2022-10-23 NOTE — Progress Notes (Signed)
PROGRESS NOTE  Daniel Crawford UJW:[id number] DOB: 12-18-1955 DOA: 10/19/2022 PCP: [facility]  Brief History:  67 year old male with a history of diabetes mellitus type 2, hypertension, NASH liver cirrhosis, hypothyroidism, CKD stage III, pancytopenia, depression/anxiety, OSA presenting with shortness of breath and orthopnea type symptoms.  The patient was at [facility] for evaluation of paracentesis.  There was not enough ascites for paracentesis.  However, he had stated that he was having more shortness of breath and dyspnea on exertion and orthopnea.  As result, the patient was taken the emergency department for further evaluation and treatment.  He endorses compliance with all his medications at home.  However, he notes increasing edema and abdominal swelling. Notably, the patient was recently mated to the hospital from 10/02/2022 to 10/05/2022 for fluid overload.  He was discharged home with torsemide 60 mg twice daily.  He denies any fevers, chills, chest pain, nausea, vomiting, diarrhea.  There is no hematochezia or melena. In the ED, the patient had low-grade temperature 100.4 F.  Oxygen saturation was 97-100% room air.  WBC 5.8, hemoglobin 10.1, platelets 76,000.  Sodium 135, potassium 3.7, bicarbonate 30, serum creatinine 1.56.  AST 42, ALT 21, alk phosphatase 83, total bilirubin 6.0.  Chest x-ray was negative for any acute findings.  Patient was started on IV furosemide.  In the morning of 10/20/2022, the patient was somewhat slow to respond to questions.  As result, there was concern for hepatic encephalopathy.  Ammonia level was checked.  His lactulose dose was increased to q 6hrs  The patient developed low-grade fever up to 101.1 F.  In addition, the patient did not have any bowel movements with increased dose of Lasix.  CT of the abdomen and pelvis was obtained and showed mild ascites.  There is mesenteric congestion and body wall anasarca similar to  10/02/2022.  There was mildly dilated lower abdominal small bowel loops.  There was circumferential thickening of the ascending colon felt to be a portal colopathy versus colitis.  The patient continued to remain confused.   Assessment/Plan: Decompensated liver cirrhosis with anasarca -Increase IV furosemide to 80 IV bid -remains clinically fluid overloaded -NEG 4.4L -many inconsistencies regarding how he takes torsemide at home -suspect a degree of dietary/fluid indiscretion contributing to his frequent decompensations -Low-sodium diet -Daily weights -Patient is not felt to be a TIPS or transplant candidate secondary to comorbidities -continue rifaximin -previously evaluated by [facility] for liver transplant, though not a candidate due to his BMI, multi-morbidities.     Acute metabolic encephalopathy/hepatic encephalopathy -10/21/22 AM--remains confused despite increased lactulose dose and "normal ammonia" -check ammonia 31>>35 -increased lactulose to 30 q 6  hrs >>now 5 BMs in last 24 hours -7/29--more lucid as he began having 4-5 BMs per day -continue rifaximin -UA--neg for pyuria -check COVID--neg -d/c oxycodone -CT brain--neg -VBG--7.48/47/42/34 -B12--1139 -folate 15.1 -TSH 8.858, Free T4 0.99>>euthyroid sick   Ileus -initially no BMs with increased dose of lactulose -no BM with lactulose first 48 hour>>>now 5 BMs in last 24 hours -10/20/22 CT abd--mesenteric congestion and body wall anasarca similar to 10/02/2022.  There was mildly dilated lower abdominal small bowel loops.  There was circumferential thickening of the ascending colon felt to be a portal colopathy versus colitis.  -added bisacodyl supp -improved  -now having 5 BM per day in last 2 days with improving mentation  Cellulitis left leg -start cefazolin -venous duplex   Portal HTN colopathy -cannot  rule out colitis on CT -GI doubts colitis   CKD 3a -baseline creatinine 1.2-1.6 -monitor with diuresis>>renal  function remains stable   Pancytopenia -due to liver cirrhosis -monitor for signs of bleeding   Diabetes mellitus type 2 -not on any agents at home -check A1C--5.1   Essential hypertension Holding coreg temporarily to allow BP margin for diuresis   Hypothyroidism Continue Synthroid   Depression/anxiety Continue Zoloft/hydroxyzine   Morbid Obesity -BMI 52.17 -lifestyle modification                   Family Communication: advocate and brother updated 7/28   Consultants:  palliative, GI   Code Status:  FULL    DVT Prophylaxis:  SCDs     Procedures: As Listed in Progress Note Above   Antibiotics: Unaysn 7/28>>7/30 Cefazolin 7/30>>        Subjective: Denies cp, sob, n/v/  Abd pain feeling better.  Complains of left leg pain  Objective: Vitals:   10/23/22 0607 10/23/22 0608 10/23/22 0922 10/23/22 1312  BP: (!) 109/56 (!) 109/56 123/70 (!) 115/59  Pulse: 87 90 98 92  Resp: 18   20  Temp: 98.5 F (36.9 C) 98.5 F (36.9 C) 98.7 F (37.1 C) 98.5 F (36.9 C)  TempSrc: Oral Oral Oral Oral  SpO2: 97% 97% 100% 100%  Weight:      Height:        Intake/Output Summary (Last 24 hours) at 10/23/2022 1634 Last data filed at 10/23/2022 1405 Gross per 24 hour  Intake 760 ml  Output 4000 ml  Net -3240 ml   Weight change: -3.8 kg Exam:  General:  Pt is alert, follows commands appropriately, not in acute distress HEENT: No icterus, No thrush, No neck mass, [place]/AT Cardiovascular: RRR, S1/S2, no rubs, no gallops Respiratory: bibasilar rales. No wheeze Abdomen: Soft/+BS, non tender, non distended, no guarding Extremities: 2 + LE edema, No lymphangitis, No petechiae, No rashes, no synovitis;  erythema left leg   Data Reviewed: I have personally reviewed following labs and imaging studies Basic Metabolic Panel: Recent Labs  Lab 10/19/22 1415 10/20/22 0501 10/21/22 0459 10/22/22 0508 10/23/22 0419  NA 132* 135 132* 131* 131*  K 3.5 3.7 4.1 4.5 4.1   CL 94* 95* 94* 95* 96*  CO2 30 30 28 26 30   GLUCOSE 224* 123* 131* 150* 126*  BUN 19 20 22  25* 26*  CREATININE 1.71* 1.56* 1.61* 1.55* 1.37*  CALCIUM 8.1* 8.3* 8.4* 8.1* 8.0*  MG  --   --  1.9 1.9  --    Liver Function Tests: Recent Labs  Lab 10/19/22 1415 10/20/22 0501 10/21/22 0459 10/22/22 0508 10/23/22 0419  AST 48* 42* 41 40 34  ALT 22 21 20 21 18   ALKPHOS 91 83 82 75 70  BILITOT 5.3* 6.0* 9.0* 9.1* 6.0*  PROT 6.2* 6.3* 6.2* 5.9* 5.8*  ALBUMIN 2.5* 2.8* 2.9* 2.8* 2.7*   Recent Labs  Lab 10/21/22 0459  LIPASE 43   Recent Labs  Lab 10/20/22 0841 10/21/22 0459 10/23/22 0419  AMMONIA 31 35 31   Coagulation Profile: Recent Labs  Lab 10/19/22 1323 10/22/22 1432 10/23/22 1159  INR 1.6* 1.8* 1.6*   CBC: Recent Labs  Lab 10/19/22 1323 10/19/22 1415 10/21/22 0459 10/22/22 0508  WBC 5.8 5.4 8.0 9.0  NEUTROABS 4.2  --   --   --   HGB 10.1* 9.9* 9.2* 9.0*  HCT 30.8* 30.9* 28.5* 28.5*  MCV 91.1 91.7 92.2 92.8  PLT  76* 76* 61* 57*   Cardiac Enzymes: No results for input(s): "CKTOTAL", "CKMB", "CKMBINDEX", "TROPONINI" in the last 168 hours. BNP: Invalid input(s): "POCBNP" CBG: Recent Labs  Lab 10/19/22 2255 10/20/22 0748 10/20/22 1201 10/20/22 1709  GLUCAP 145* 126* 122* 106*   HbA1C: No results for input(s): "HGBA1C" in the last 72 hours. Urine analysis:    Component Value Date/Time   COLORURINE YELLOW 10/20/2022 1045   APPEARANCEUR CLEAR 10/20/2022 1045   LABSPEC 1.008 10/20/2022 1045   PHURINE 7.0 10/20/2022 1045   GLUCOSEU NEGATIVE 10/20/2022 1045   HGBUR NEGATIVE 10/20/2022 1045   BILIRUBINUR NEGATIVE 10/20/2022 1045   KETONESUR NEGATIVE 10/20/2022 1045   PROTEINUR NEGATIVE 10/20/2022 1045   NITRITE NEGATIVE 10/20/2022 1045   LEUKOCYTESUR NEGATIVE 10/20/2022 1045   Sepsis Labs: @LABRCNTIP (procalcitonin:4,lacticidven:4) ) Recent Results (from the past 240 hour(s))  Culture, blood (Routine X 2) w Reflex to ID Panel     Status: None  (Preliminary result)   Collection Time: 10/20/22  9:13 AM   Specimen: Right Antecubital; Blood  Result Value Ref Range Status   Specimen Description   Final    RIGHT ANTECUBITAL BOTTLES DRAWN AEROBIC AND ANAEROBIC   Special Requests Blood Culture adequate volume  Final   Culture   Final    NO GROWTH 3 DAYS Performed at [facility], [address], [place], [place] [address]    Report Status PENDING  Incomplete  Culture, blood (Routine X 2) w Reflex to ID Panel     Status: None (Preliminary result)   Collection Time: 10/20/22  9:13 AM   Specimen: BLOOD RIGHT HAND  Result Value Ref Range Status   Specimen Description   Final    BLOOD RIGHT HAND BOTTLES DRAWN AEROBIC AND ANAEROBIC   Special Requests Blood Culture adequate volume  Final   Culture   Final    NO GROWTH 3 DAYS Performed at [facility], [address], [place], [place] [address]    Report Status PENDING  Incomplete  SARS Coronavirus 2 by RT PCR (hospital order, performed in [facility] hospital lab) *cepheid single result test* Anterior Nasal Swab     Status: None   Collection Time: 10/20/22  6:00 PM   Specimen: Anterior Nasal Swab  Result Value Ref Range Status   SARS Coronavirus 2 by RT PCR NEGATIVE NEGATIVE Final    Comment: (NOTE) SARS-CoV-2 target nucleic acids are NOT DETECTED.  The SARS-CoV-2 RNA is generally detectable in upper and lower respiratory specimens during the acute phase of infection. The lowest concentration of SARS-CoV-2 viral copies this assay can detect is 250 copies / mL. A negative result does not preclude SARS-CoV-2 infection and should not be used as the sole basis for treatment or other patient management decisions.  A negative result Mutschler occur with improper specimen collection / handling, submission of specimen other than nasopharyngeal swab, presence of viral mutation(s) within the areas targeted by this assay, and inadequate number of viral copies (<250 copies / mL). A negative result  must be combined with clinical observations, patient history, and epidemiological information.  Fact Sheet for Patients:   RoadLapTop.co.za  Fact Sheet for Healthcare Providers: http://kim-miller.com/  This test is not yet approved or  cleared by the [place] FDA and has been authorized for detection and/or diagnosis of SARS-CoV-2 by FDA under an Emergency Use Authorization (EUA).  This EUA will remain in effect (meaning this test can be used) for the duration of the COVID-19 declaration under Section 564(b)(1) of the  Act, 21 U.S.C. section 360bbb-3(b)(1), unless the authorization is terminated or revoked sooner.  Performed at [facility], [address] [place], [place] [address]      Scheduled Meds:  enoxaparin (LOVENOX) injection  80 mg Subcutaneous Q24H   furosemide  80 mg Intravenous BID   lactose free nutrition  237 mL Oral QPM   lactulose  30 g Oral BID   levothyroxine  175 mcg Oral QAC breakfast   pantoprazole  40 mg Oral Daily   potassium chloride SA  40 mEq Oral BID   rifaximin  550 mg Oral BID   sertraline  100 mg Oral Daily   spironolactone  100 mg Oral Daily   Continuous Infusions:   ceFAZolin (ANCEF) IV      Procedures/Studies: CT HEAD WO CONTRAST ( )  Result Date: 10/21/2022 CLINICAL DATA:  Delirium, altered level of consciousness EXAM: CT HEAD WITHOUT CONTRAST TECHNIQUE: Contiguous axial images were obtained from the base of the skull through the vertex without intravenous contrast. RADIATION DOSE REDUCTION: This exam was performed according to the departmental dose-optimization program which includes automated exposure control, adjustment of the mA and/or kV according to patient size and/or use of iterative reconstruction technique. COMPARISON:  08/22/2021 FINDINGS: Brain: No acute infarct or hemorrhage. Lateral ventricles and midline structures are stable. No acute extra-axial fluid collections. No mass  effect. Vascular: No hyperdense vessel or unexpected calcification. Skull: Normal. Negative for fracture or focal lesion. Sinuses/Orbits: No acute finding. Other: None. IMPRESSION: 1. No acute intracranial process. Electronically Signed   By: Sharlet Salina M.D.   On: 10/21/2022 16:37   CT ABDOMEN PELVIS WO CONTRAST  Result Date: 10/20/2022 CLINICAL DATA:  Abdominal pain.  Known liver cirrhosis with ascites. EXAM: CT ABDOMEN AND PELVIS WITHOUT IV CONTRAST ( ORAL CONTRAST ONLY ) TECHNIQUE: Multidetector CT imaging of the abdomen and pelvis was performed following the standard protocol without IV contrast, following oral contrast only. RADIATION DOSE REDUCTION: This exam was performed according to the departmental dose-optimization program which includes automated exposure control, adjustment of the mA and/or kV according to patient size and/or use of iterative reconstruction technique. COMPARISON:  CT with contrast 10/02/2022, CT without contrast 01/22/2022 FINDINGS: Lower chest: Small to moderate-sized right pleural effusion, increased. No left pleural effusion. There is compressive atelectasis in the right lower lobe adjacent the effusion but no lung base infiltrates. Breathing motion limits fine detail.  The cardiac size is normal. Hepatobiliary: The liver is cirrhotic and there is dilatation of the main portal vein which measures 1.9 cm. No mass is seen. Again surgically absent gallbladder without biliary dilatation. Pancreas: No focal abnormality in the unenhanced pancreas. There are generalized mesenteric congestive changes. Some of the edema is seen posterior and inferior to the pancreas and potentially could indicate evidence of pancreatitis, but this was also seen previously. Correlate with serum lipase for possible significance. Spleen: Enlarged measuring 22 cm length. There are splenorenal varices which were better seen with contrast. No focal parenchymal abnormality. Adrenals/Urinary Tract: There is  no adrenal mass. There is a 3 mm nonobstructive caliceal stone in the inferior pole right kidney and occasional punctate nonobstructive caliceal stones on the left. There is no contour deforming abnormality of either kidney. No hydronephrosis or ureteral stone. Unremarkable bladder for the degree of distention. Stomach/Bowel: Somewhat thickened folds in the stomach, could be due to nondistention, gastritis or portal gastropathy. There are mildly dilated mid to lower abdominal small bowel loops up to 3 cm caliber, more normal caliber in  distal small bowel with no visible transitional segment. Circumferential thickening is again seen in the ascending colon. Rest of the large bowel wall unremarkable. This could be from colitis or portal colopathy. The appendix is normal caliber. Vascular/Lymphatic: Small varices in the omentum and abdominal wall. Unremarkable unenhanced aorta. Dilated portal vein as described above.  No lymphadenopathy is seen. Reproductive: No prostatomegaly. Other: There is mild patchy ascites in the mesenteric folds, mild upper abdominal and pelvic ascites but no drainable pocket. As above there is generalized mesenteric congestion. Moderate body wall anasarca continues to be seen there is a left inguinal hernia repair. There is a moderate-sized umbilical fat hernia containing scattered fluid in vessels. Musculoskeletal: There is osteopenia and degenerative change of the spine. Severe acquired foraminal stenosis L4-5 and L5-S1. Acquired spinal stenosis L4-5 and L5-S1. No acute or significant osseous findings. IMPRESSION: 1. Cirrhotic liver with dilated portal vein, splenomegaly, splenorenal varices, and mild ascites. 2. Mesenteric congestion and body wall anasarca, similar to the last CT 10/02/2022. 3. Edema posterior and inferior to the pancreas which could be due to pancreatitis or part of the mesenteric congestive change, but this was also seen previously. Correlate with serum lipase. 4. Mildly  dilated mid to lower abdominal small bowel loops with more normal caliber in distal small bowel. Findings could be due to ileus or partial obstruction. 5. Circumferential thickening of the ascending colon which could be due to colitis or portal colopathy. 6. Somewhat thickened gastric folds which could be due to nondistention, gastritis or portal gastropathy. 7. Small to moderate-sized right pleural effusion, increased. 8. Nonobstructive nephrolithiasis. 9. Moderate-sized umbilical fat hernia containing scattered fluid and vessels. 10. Osteopenia and degenerative change. Severe acquired foraminal and spinal canal stenosis L4-5 and L5-S1. Electronically Signed   By: Almira Bar M.D.   On: 10/20/2022 22:46   DG Chest 2 View  Result Date: 10/19/2022 CLINICAL DATA:  Shortness of breath. EXAM: CHEST - 2 VIEW COMPARISON:  September 15, 2022. FINDINGS: The heart size and mediastinal contours are within normal limits. Both lungs are clear. The visualized skeletal structures are unremarkable. IMPRESSION: No active cardiopulmonary disease. Electronically Signed   By: Lupita Raider M.D.   On: 10/19/2022 15:08   [place] ASCITES (ABDOMEN LIMITED)  Result Date: 10/19/2022 CLINICAL DATA:  History of cirrhosis and ascites. Evaluation for possible paracentesis. EXAM: LIMITED ABDOMEN ULTRASOUND FOR ASCITES TECHNIQUE: Limited ultrasound survey for ascites was performed in all four abdominal quadrants. COMPARISON:  CT of the abdomen and pelvis on 10/02/2022 FINDINGS: Four quadrant survey of the peritoneal cavity demonstrates no significant ascites. Paracentesis was therefore not performed. IMPRESSION: No significant ascites. Paracentesis was not performed. Electronically Signed   By: Irish Lack M.D.   On: 10/19/2022 14:04   CT ABDOMEN PELVIS W CONTRAST  Result Date: 10/02/2022 CLINICAL DATA:  Left lower abdominal pain, nausea EXAM: CT ABDOMEN AND PELVIS WITH CONTRAST TECHNIQUE: Multidetector CT imaging of the abdomen and  pelvis was performed using the standard protocol following bolus administration of intravenous contrast. RADIATION DOSE REDUCTION: This exam was performed according to the departmental dose-optimization program which includes automated exposure control, adjustment of the mA and/or kV according to patient size and/or use of iterative reconstruction technique. CONTRAST:  OMNIPAQUE IOHEXOL 300 MG/ML  SOLN COMPARISON:  01/22/2022 FINDINGS: Lower chest: Small right pleural effusion, new since previous. No pneumothorax. Visualized lung bases clear. Hepatobiliary: Nodular hepatic contour suggesting cirrhosis. No focal liver lesion or biliary ductal dilatation. Cholecystectomy clips. Portal vein patent. Pancreas:  No mass or ductal dilatation. Increase in retroperitoneal inflammatory/edematous changes predominately posterior and inferior to the pancreas. No pseudocyst. Spleen: Splenomegaly 21.4 cm craniocaudal length. No focal lesion. Relatively small splenorenal venous shunt. Adrenals/Urinary Tract: No adrenal mass. Symmetric renal parenchymal enhancement. 4 mm right lower pole renal calculus. No hydronephrosis. Urinary bladder is nondistended. Stomach/Bowel: Core small esophageal varices. The stomach is nondistended, without acute finding. Small bowel nondilated. Normal appendix. Colon incompletely distended, with suggestion of circumferential wall thickening in multiple segments, focal lesion evident. Vascular/Lymphatic: No AAA. Portal vein patent. Small esophageal varices. IVC patent. Reproductive: Prostate is unremarkable. Other: Small volume predominantly perihepatic ascites.  No free air. Musculoskeletal: Body wall anasarca most marked anteriorly. Moderate paraumbilical hernia containing only mesenteric fat and vessels, no bowel. Sutures of laparoscopic left inguinal hernia repair. Mild multilevel spondylitic changes in the lumbar spine. IMPRESSION: 1. Nonspecific retroperitoneal inflammatory/edematous changes  predominately posterior and inferior to the pancreas. Correlate with amylase and lipase to exclude pancreatitis. 2. Cirrhosis with splenomegaly, small volume ascites, and small esophageal varices. 3. Small right pleural effusion, new since previous. 4. Nonobstructive right nephrolithiasis. 5. Moderate paraumbilical hernia containing mesenteric fat and vessels, no bowel. Electronically Signed   By: Corlis Leak M.D.   On: 10/02/2022 16:51    Catarina Hartshorn, DO  [facility]  If 7PM-7AM, please contact night-coverage [email or web address] Password [facility] 10/23/2022, 4:34 PM   LOS: 4 days

## 2022-10-23 NOTE — Progress Notes (Signed)
   10/23/22 1600  ReDS Vest / Clip  Station Marker D  Ruler Value 47  ReDS Value Range < 36  ReDS Actual Value 30

## 2022-10-23 NOTE — Progress Notes (Signed)
Palliative:  HPI: 67 y.o. male  with past medical history of NASH cirrhosis, diabetes, CKD stage 3, HFpEF, HTN, asthma, OSA, anemia, thrombocytopenia, hypothyroidism, depression admitted on 10/19/2022 with shortness of breath and orthopnea with increased fluid retention secondary to decompensated liver cirrhosis.   I met today again with Daniel Crawford. No visitors at bedside. He is much more awake and bright eyed today. He reports that he is feeling better today but complains about bowel movements and lactulose. He tells me that his nurse spoke with him about concern that he cannot care for himself at home - I agreed that I am worried about this as well. Kolby shares that he can care for himself and that he has good support from neighbors and friends. His brother and sister live in [place]. I shared with Jaekob that I worry he will not be able to live alone much longer in his condition. He tells me he knows but wants to remain in his home as long as possible. He tells me that he does not want to talk about this and he does not want to cry and be upset. I expressed my condolences for what he is going through and reassured him that we will continue to support him the best we are able. He gives me permission to speak with his friends Carney Bern and Brett Canales as well as his brother.   I was able to call and speak with friend, Carney Bern. Carney Bern is familiar with Daniel Crawford's illness and decline. She tells me that he speaks openly to her about his illness and she knows that he has been struggling. I spoke with Carney Bern about being [facility] and she tells me this has been Daniel Crawford's wishes but she does not know if he ever completed the paperwork officially. I spoke with her about my concern for decisions ahead that Engelbert come up for his HCPOA. Carney Bern shares that she feels any major healthcare decisions should come from his brother, Daniel Crawford. Carney Bern knows Taelyn's wishes as far as his affairs but does not want to make major healthcare decisions if Suleiman is unable to do so himself. Carney Bern  shares that she is surprised that he does not have DNR and feels that he would benefit from hospice support if he plans to return home. She voices concern about him returning home. She encourages me to call and speak with brother Daniel Crawford. I will reach out to Bessie tomorrow to discuss further. Carney Bern agrees that a visit from chaplain could be beneficial for Daniel Crawford. Carney Bern will continue to support Taimak the best she can but is unable to visit as much as she would like as she is helping in the care of her aging mother.   I also discussed with chaplain Sayward and Dr. Arbutus Leas separately. Ongoing goals of care conversations.   All questions/concerns addressed to the best of my ability. Emotional support provided.   Exam: Alert, oriented. Fatigued. No distress. Breathing less labored - improved with low amount of oxygen. Abd distended. Anasarca.   Plan: - Full code, full scope requested by patient - NOT interested in hospice services - Ongoing goals of care - difficult situation as Daniel Crawford has much fear around his worsening illness and avoidant of conversation about the future - Brother Daniel Crawford is Runner, broadcasting/film/video at this time  50 min  Yong Channel, NP Palliative Medicine Team Pager [phone] (Please see [email or web address] for schedule) Team Phone [phone]    Greater than 50%  of this time was spent counseling and coordinating care related  to the above assessment and plan

## 2022-10-23 NOTE — Progress Notes (Addendum)
Subjective: Reports he is a little better today.  Thinks that his swelling is a little better.  Reports he is having a lot of diarrhea.  At least 5-6 bowel movements a day, currently receiving lactulose 4 times a day.  States he is only taking lactulose as needed at home.  Denies having diarrhea prior to admission.  No abdominal pain, nausea, vomiting, BRBPR or melena.   Objective: Vital signs in last 24 hours: Temp:  [98.5 F (36.9 C)-99.2 F (37.3 C)] 98.7 F (37.1 C) (07/30 0922) Pulse Rate:  [87-98] 98 (07/30 0922) Resp:  [18-20] 18 (07/30 0607) BP: (109-124)/(51-70) 123/70 (07/30 0922) SpO2:  [97 %-100 %] 100 % (07/30 0922) Weight:  [175.4 kg] 175.4 kg (07/30 0459) Last BM Date : 10/22/22 General:   Alert and oriented, pleasant, NAD.  On 1.5 L O2 via nasal cannula. Head:  Normocephalic and atraumatic. Eyes:  + Scleral icterus Abdomen:  Bowel sounds present, obese, large AP diameter, soft.  Soft, nontender and mobile hernia present.  Dependent pitting edema in bilateral flanks. Msk:  Symmetrical without gross deformities. Normal posture. Extremities:  With 3+ pitting edema into the thighs.  Mild erythema of the left lower extremity. Neurologic:  Alert and  oriented x4;  grossly normal neurologically.  No asterixis. Skin:  Warm and dry, intact without significant lesions.  Psych:  Normal mood and affect.  Intake/Output from previous day: 07/29 0701 - 07/30 0700 In: 840 [P.O.:840] Out: 2600 [Urine:2600] Intake/Output this shift: Total I/O In: -  Out: 1200 [Urine:1200]  Lab Results: Recent Labs    10/21/22 0459 10/22/22 0508  WBC 8.0 9.0  HGB 9.2* 9.0*  HCT 28.5* 28.5*  PLT 61* 57*   BMET Recent Labs    10/21/22 0459 10/22/22 0508 10/23/22 0419  NA 132* 131* 131*  K 4.1 4.5 4.1  CL 94* 95* 96*  CO2 28 26 30   GLUCOSE 131* 150* 126*  BUN 22 25* 26*  CREATININE 1.61* 1.55* 1.37*  CALCIUM 8.4* 8.1* 8.0*   LFT Recent Labs    10/21/22 0459 10/22/22 0508  10/23/22 0419  PROT 6.2* 5.9* 5.8*  ALBUMIN 2.9* 2.8* 2.7*  AST 41 40 34  ALT 20 21 18   ALKPHOS 82 75 70  BILITOT 9.0* 9.1* 6.0*   PT/INR Recent Labs    10/22/22 1432  LABPROT 20.7*  INR 1.8*     Studies/Results: CT HEAD WO CONTRAST ( )  Result Date: 10/21/2022 CLINICAL DATA:  Delirium, altered level of consciousness EXAM: CT HEAD WITHOUT CONTRAST TECHNIQUE: Contiguous axial images were obtained from the base of the skull through the vertex without intravenous contrast. RADIATION DOSE REDUCTION: This exam was performed according to the departmental dose-optimization program which includes automated exposure control, adjustment of the mA and/or kV according to patient size and/or use of iterative reconstruction technique. COMPARISON:  08/22/2021 FINDINGS: Brain: No acute infarct or hemorrhage. Lateral ventricles and midline structures are stable. No acute extra-axial fluid collections. No mass effect. Vascular: No hyperdense vessel or unexpected calcification. Skull: Normal. Negative for fracture or focal lesion. Sinuses/Orbits: No acute finding. Other: None. IMPRESSION: 1. No acute intracranial process. Electronically Signed   By: Sharlet Salina M.D.   On: 10/21/2022 16:37    Assessment: 67 y.o. 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   Possible Colitis on CT: Circumferential thickening of the ascending colon noted on CT.  I suspect this is secondary to portal colopathy rather than colitis as patient denies abdominal pain or diarrhea prior to admission.  He is currently having diarrhea, but this is in the setting of lactulose.  Nonetheless, he has been started on empiric IV antibiotics though it would be reasonable to stop these at this  point unless antibiotics are needed for something other than colitis.   Decompensated MASH cirrhosis: MELD 3.0 26 based on yesterdays INR.  Will recheck INR today.  He is not a transplant candidate. He had been transitioned to Hospice as outpatient, but he then desired to return to the palliative pathway.  Palliative is currently following inpatient.    Continues with significant anasarca.  No significant ascites on para last Friday. He is down 20 lbs since admission.  Currently on Lasix 80 mg twice daily and spironolactone 100 mg daily.  Kidney function is remaining stable/actually improved today with creatinine 1.37.  Patient had an episode of encephalopathy noted on 7/27.  There was concern about possible HE, but ammonia levels were within normal limits.  Nonetheless, he has been receiving lactulose every 6 hours since that time and is having diarrhea/at least 5 or 6 bowel movements a day.  He is not encephalopathic today, and has no asterixis.  Will decrease lactulose to twice a day with a goal of 3-4 bowel movements per day.  Abdominal imaging up-to-date.  AFP tumor marker normal recently.     Ultimately, he is due for EGD for variceal screening and early interval colonoscopy as cecal polyp not removed last April. Pulmonary has deemed him moderate to high risk for respiratory failure. He is a poor candidate for anesthesia. Conservative measures recommended at this time.   Mild erythema of LLE:  Unclear if this is new. I have notified Dr. Arbutus Leas.   Plan: OK to stop antibiotics for colitis. Continue diuresing with IV Lasix 80 mg twice daily and spironolactone 100 mg daily as renal function allows. Consider additional rounds of IV albumin.  Will discuss with Dr. Levon Hedger. Decrease lactulose to twice daily.  Goal of 3-4 bowel movements daily. Continue Xifaxan twice daily. Monitor for hepatic encephalopathy. Continue 2 g sodium diet. Strict I's and O's. Daily weights. Needs daily MELD  labs.    LOS: 4 days    10/23/2022, 12:27 PM   Ermalinda Memos, [facility]

## 2022-10-24 ENCOUNTER — Inpatient Hospital Stay (HOSPITAL_COMMUNITY): Payer: Medicare HMO

## 2022-10-24 DIAGNOSIS — K7581 Nonalcoholic steatohepatitis (NASH): Secondary | ICD-10-CM | POA: Diagnosis not present

## 2022-10-24 DIAGNOSIS — Z515 Encounter for palliative care: Secondary | ICD-10-CM | POA: Diagnosis not present

## 2022-10-24 DIAGNOSIS — E119 Type 2 diabetes mellitus without complications: Secondary | ICD-10-CM

## 2022-10-24 DIAGNOSIS — K7460 Unspecified cirrhosis of liver: Secondary | ICD-10-CM | POA: Diagnosis not present

## 2022-10-24 DIAGNOSIS — E039 Hypothyroidism, unspecified: Secondary | ICD-10-CM

## 2022-10-24 DIAGNOSIS — R601 Generalized edema: Secondary | ICD-10-CM | POA: Diagnosis not present

## 2022-10-24 DIAGNOSIS — K7290 Hepatic failure, unspecified without coma: Secondary | ICD-10-CM | POA: Diagnosis not present

## 2022-10-24 DIAGNOSIS — Z7189 Other specified counseling: Secondary | ICD-10-CM | POA: Diagnosis not present

## 2022-10-24 LAB — CBC
HCT: 25.7 % — ABNORMAL LOW (ref 39.0–52.0)
Hemoglobin: 8.5 g/dL — ABNORMAL LOW (ref 13.0–17.0)
MCH: 30.4 pg (ref 26.0–34.0)
MCHC: 33.1 g/dL (ref 30.0–36.0)
MCV: 91.8 fL (ref 80.0–100.0)
Platelets: 61 10*3/uL — ABNORMAL LOW (ref 150–400)
RBC: 2.8 MIL/uL — ABNORMAL LOW (ref 4.22–5.81)
RDW: 18.1 % — ABNORMAL HIGH (ref 11.5–15.5)
WBC: 5.9 10*3/uL (ref 4.0–10.5)
nRBC: 0 % (ref 0.0–0.2)

## 2022-10-24 MED ORDER — SPIRONOLACTONE 25 MG PO TABS
50.0000 mg | ORAL_TABLET | Freq: Every day | ORAL | Status: DC
  Administered 2022-10-24 – 2022-10-26 (×3): 50 mg via ORAL
  Filled 2022-10-24 (×3): qty 2

## 2022-10-24 NOTE — Plan of Care (Signed)
  Problem: Education: Goal: Ability to demonstrate management of disease process will improve Outcome: Progressing Goal: Ability to verbalize understanding of medication therapies will improve Outcome: Progressing Goal: Individualized Educational Video(s) Outcome: Progressing   Problem: Cardiac: Goal: Ability to achieve and maintain adequate cardiopulmonary perfusion will improve Outcome: Progressing   Problem: Education: Goal: Knowledge of General Education information will improve Description: Including pain rating scale, medication(s)/side effects and non-pharmacologic comfort measures Outcome: Progressing   Problem: Health Behavior/Discharge Planning: Goal: Ability to manage health-related needs will improve Outcome: Progressing   Problem: Clinical Measurements: Goal: Ability to maintain clinical measurements within normal limits will improve Outcome: Progressing Goal: Will remain free from infection Outcome: Progressing Goal: Diagnostic test results will improve Outcome: Progressing Goal: Respiratory complications will improve Outcome: Progressing Goal: Cardiovascular complication will be avoided Outcome: Progressing   Problem: Nutrition: Goal: Adequate nutrition will be maintained Outcome: Progressing   Problem: Coping: Goal: Level of anxiety will decrease Outcome: Progressing   Problem: Elimination: Goal: Will not experience complications related to bowel motility Outcome: Progressing Goal: Will not experience complications related to urinary retention Outcome: Progressing   Problem: Pain Managment: Goal: General experience of comfort will improve Outcome: Progressing   Problem: Safety: Goal: Ability to remain free from injury will improve Outcome: Progressing   Problem: Skin Integrity: Goal: Risk for impaired skin integrity will decrease Outcome: Progressing   Problem: Activity: Goal: Capacity to carry out activities will improve Outcome: Not  Progressing   Problem: Activity: Goal: Risk for activity intolerance will decrease Outcome: Not Progressing

## 2022-10-24 NOTE — Progress Notes (Signed)
Palliative:  HPI: 67 y.o. male  with past medical history of NASH cirrhosis, diabetes, CKD stage 3, HFpEF, HTN, asthma, OSA, anemia, thrombocytopenia, hypothyroidism, depression admitted on 10/19/2022 with shortness of breath and orthopnea with increased fluid retention secondary to decompensated liver cirrhosis.   I met again today with Daniel Crawford. No visitors present. Daniel Crawford continues to struggle with conversation regarding progression of his disease and decisions for the future. Overall he reports feeling better. He continues to complain about lactulose and bowel movements. He struggles to pull himself sitting up a little in bed - I worry how he will manage at home especially with lactulose. He continues to reassure me that he has good support from friends and neighbors. I did share that Daniel Crawford was worried about him being at home as well. He says that he continues to reassure her that he has support and continues to believe he can return home.   I called and spoke with brother, Daniel Crawford. I reviewed with Daniel Crawford the severity of Daniel Crawford's liver failure. We discussed path forward and concern for decisions that Daniel Crawford come up in the future. I prepared Daniel Crawford for phone calls that he Sidle receive and decisions that Daniel Crawford need to be made in the future. We discussed overall prognosis and expectations.   All questions/concerns addressed. Emotional support provided.   Exam: Alert, oriented. No distress. Anasarca. Breathing regular, unlabored. Abd distended. Generalized weakness and fatigue.   Plan: - Full code, full scope requested by patient - NOT interested in hospice services - Ongoing goals of care - difficult situation as Daniel Crawford has much fear around his worsening illness and avoidant of conversation about the future - Brother Daniel Crawford is Runner, broadcasting/film/video at this time  55 min  Yong Channel, NP Palliative Medicine Team Pager [phone] (Please see [email or web address] for schedule) Team Phone [phone]    Greater than 50%  of  this time was spent counseling and coordinating care related to the above assessment and plan

## 2022-10-24 NOTE — Progress Notes (Signed)
PROGRESS NOTE  Daniel Crawford ZOX:[id number] DOB: 02/03/1956 DOA: 10/19/2022 PCP: [facility]  Brief History:  67 year old male with a history of diabetes mellitus type 2, hypertension, NASH liver cirrhosis, hypothyroidism, CKD stage III, pancytopenia, depression/anxiety, OSA presenting with shortness of breath and orthopnea type symptoms.  The patient was at [facility] for evaluation of paracentesis.  There was not enough ascites for paracentesis.  However, he had stated that he was having more shortness of breath and dyspnea on exertion and orthopnea.  As result, the patient was taken the emergency department for further evaluation and treatment.  He endorses compliance with all his medications at home.  However, he notes increasing edema and abdominal swelling. Notably, the patient was recently mated to the hospital from 10/02/2022 to 10/05/2022 for fluid overload.  He was discharged home with torsemide 60 mg twice daily.  He denies any fevers, chills, chest pain, nausea, vomiting, diarrhea.  There is no hematochezia or melena. In the ED, the patient had low-grade temperature 100.4 F.  Oxygen saturation was 97-100% room air.  WBC 5.8, hemoglobin 10.1, platelets 76,000.  Sodium 135, potassium 3.7, bicarbonate 30, serum creatinine 1.56.  AST 42, ALT 21, alk phosphatase 83, total bilirubin 6.0.  Chest x-ray was negative for any acute findings.  Patient was started on IV furosemide.  In the morning of 10/20/2022, the patient was somewhat slow to respond to questions.  As result, there was concern for hepatic encephalopathy.  Ammonia level was checked.  His lactulose dose was increased to q 6hrs  The patient developed low-grade fever up to 101.1 F.  In addition, the patient did not have any bowel movements with increased dose of lactulose.  CT of the abdomen and pelvis was obtained and showed mild ascites.  There is mesenteric congestion and body wall anasarca similar  to 10/02/2022.  There was mildly dilated lower abdominal small bowel loops.  There was circumferential thickening of the ascending colon felt to be a portal colopathy versus colitis.  The patient continued to remain confused. Finally on 10/22/22 patient began having BMs with improvement of his mentation.   Assessment/Plan: Decompensated liver cirrhosis with anasarca -remains clinically fluid overloaded -many inconsistencies regarding how he takes torsemide at home -suspect a degree of dietary/fluid indiscretion contributing to his frequent decompensations -Continue low-sodium diet -Continue to follow daily weights and strict I's and O's. -Patient is not felt to be a TIPS or transplant candidate secondary to comorbidities -continue rifaximin and the use of lactulose -previously evaluated by [facility] for liver transplant, though not a candidate due to his BMI, multi-morbidities.   -Continue IV Lasix (80 mg twice a day) and increase spironolactone dosage -Continue to follow GI service recommendation.   Acute metabolic encephalopathy/hepatic encephalopathy -10/21/22 AM--remains confused despite increased lactulose dose and "normal ammonia" -check ammonia 31>>35 -increased lactulose to 30 q 6  hrs >>now 5 BMs in last 24 hours -continue rifaximin -UA--neg for pyuria -check COVID--neg -d/c oxycodone -CT brain--neg -VBG--7.48/47/42/34 -B12--1139 -folate 15.1 -TSH 8.858, Free T4 0.99>>euthyroid sick; with plan to repeat thyroid panel as an outpatient. -Mentation back to baseline.   Ileus -initially no BMs with increased dose of lactulose -no BM with lactulose first 48 hour>>>now 5 BMs in last 24 hours -10/20/22 CT abd--mesenteric congestion and body wall anasarca similar to 10/02/2022.  There was mildly dilated lower abdominal small bowel loops.  There was circumferential thickening of the ascending colon felt to be  a portal colopathy versus colitis.  -improved  -now having 5 BM per day in last 2  days with improving mentation -Following GI recommendations lactulose will be adjusted noting to achieve 3-4 bowel movements daily.  Cellulitis left leg -Continue cefazolin; wounds or active drainage appreciated. -venous duplex not demonstrating acute DVT.   Portal HTN colopathy -cannot rule out colitis on CT -GI doubts colitis -Continue supportive care and follow GI recommendations.   CKD 3a -baseline creatinine 1.2-1.6 -Renal function has remained stable; continue to follow creatinine trend with the use of Lasix.   Pancytopenia -due to liver cirrhosis -No overt bleeding appreciated -Continue to follow blood counts   Diabetes mellitus type 2 -not on any agents at home -check A1C--5.1 -Continue modified carbohydrate diet.   Essential hypertension -Continue holding coreg temporarily to allow BP margin for diuresis   Hypothyroidism -Continue Synthroid -Outpatient thyroid panel follow-up recommended.   Depression/anxiety -Continue Zoloft/hydroxyzine -Stable mood.   Morbid Obesity -BMI 52.17 -Low-calorie diet and portion control discussed with patient.        Family Communication: advocate and brother updated 7/28   Consultants:  palliative, GI   Code Status:  FULL    DVT Prophylaxis:  SCDs     Procedures: As Listed in Progress Note Above   Antibiotics: Unaysn 7/28>>7/30 Cefazolin 7/30>>  Subjective: No chest pain, no nausea, no vomiting, overall feeling better.  Complaining of too many bowel movements with the use of lactulose.  Still with signs of fluid overload.  Anasarca appreciated on exam.   Objective: Vitals:   10/24/22 0500 10/24/22 1141 10/24/22 1250 10/24/22 1757  BP:  122/72  130/62  Pulse:  82  79  Resp:    (!) 22  Temp:  98.2 F (36.8 C)  98.1 F (36.7 C)  TempSrc:  Oral    SpO2:  99%  99%  Weight: (!) 181.9 kg  (!) 180.8 kg   Height:        Intake/Output Summary (Last 24 hours) at 10/24/2022 1819 Last data filed at 10/24/2022  1239 Gross per 24 hour  Intake 1239.87 ml  Output 3300 ml  Net -2060.13 ml   Weight change: 6.5 kg  Exam: General exam: Alert, awake, oriented x 3; still with signs of fluid overload; no chest pain, no nausea or vomiting.  Reporting multiple bowel movements after receiving lactulose. Respiratory system: Clear to auscultation. Respiratory effort normal.  Good saturation on room air.  No using accessory muscle. Cardiovascular system:RRR. No rubs or gallops; unable to assess JVD with body habitus. Gastrointestinal system: Abdomen is obese, nontender to palpation.  Positive bowel sounds. Central nervous system: Alert and oriented. No focal neurological deficits. Extremities: No cyanosis or clubbing; 2-3+ edema bilaterally. Skin: No petechiae.  Lower extremity stasis dermatitis appreciated bilaterally. Psychiatry: Judgement and insight appear normal. Mood & affect appropriate.   Data Reviewed: I have personally reviewed following labs and imaging studies  Basic Metabolic Panel: Recent Labs  Lab 10/20/22 0501 10/21/22 0459 10/22/22 0508 10/23/22 0419 10/24/22 0353  NA 135 132* 131* 131* 133*  K 3.7 4.1 4.5 4.1 4.1  CL 95* 94* 95* 96* 95*  CO2 30 28 26 30 28   GLUCOSE 123* 131* 150* 126* 115*  BUN 20 22 25* 26* 24*  CREATININE 1.56* 1.61* 1.55* 1.37* 1.31*  CALCIUM 8.3* 8.4* 8.1* 8.0* 8.1*  MG  --  1.9 1.9  --  2.1   Liver Function Tests: Recent Labs  Lab 10/20/22 0501 10/21/22 0459 10/22/22 4098  10/23/22 0419 10/24/22 0353  AST 42* 41 40 34 36  ALT 21 20 21 18 16   ALKPHOS 83 82 75 70 66  BILITOT 6.0* 9.0* 9.1* 6.0* 4.2*  PROT 6.3* 6.2* 5.9* 5.8* 6.1*  ALBUMIN 2.8* 2.9* 2.8* 2.7* 2.9*   Recent Labs  Lab 10/21/22 0459  LIPASE 43   Recent Labs  Lab 10/20/22 0841 10/21/22 0459 10/23/22 0419  AMMONIA 31 35 31   Coagulation Profile: Recent Labs  Lab 10/19/22 1323 10/22/22 1432 10/23/22 1159 10/24/22 0353  INR 1.6* 1.8* 1.6* 1.5*   CBC: Recent Labs  Lab  10/19/22 1323 10/19/22 1415 10/21/22 0459 10/22/22 0508 10/24/22 0556  WBC 5.8 5.4 8.0 9.0 5.9  NEUTROABS 4.2  --   --   --   --   HGB 10.1* 9.9* 9.2* 9.0* 8.5*  HCT 30.8* 30.9* 28.5* 28.5* 25.7*  MCV 91.1 91.7 92.2 92.8 91.8  PLT 76* 76* 61* 57* 61*   CBG: Recent Labs  Lab 10/19/22 2255 10/20/22 0748 10/20/22 1201 10/20/22 1709  GLUCAP 145* 126* 122* 106*   Urine analysis:    Component Value Date/Time   COLORURINE YELLOW 10/20/2022 1045   APPEARANCEUR CLEAR 10/20/2022 1045   LABSPEC 1.008 10/20/2022 1045   PHURINE 7.0 10/20/2022 1045   GLUCOSEU NEGATIVE 10/20/2022 1045   HGBUR NEGATIVE 10/20/2022 1045   BILIRUBINUR NEGATIVE 10/20/2022 1045   KETONESUR NEGATIVE 10/20/2022 1045   PROTEINUR NEGATIVE 10/20/2022 1045   NITRITE NEGATIVE 10/20/2022 1045   LEUKOCYTESUR NEGATIVE 10/20/2022 1045   Sepsis Labs: @LABRCNTIP (procalcitonin:4,lacticidven:4) ) Recent Results (from the past 240 hour(s))  Culture, blood (Routine X 2) w Reflex to ID Panel     Status: None (Preliminary result)   Collection Time: 10/20/22  9:13 AM   Specimen: Right Antecubital; Blood  Result Value Ref Range Status   Specimen Description   Final    RIGHT ANTECUBITAL BOTTLES DRAWN AEROBIC AND ANAEROBIC   Special Requests Blood Culture adequate volume  Final   Culture   Final    NO GROWTH 4 DAYS Performed at [facility], [address] [place], [place] [address]    Report Status PENDING  Incomplete  Culture, blood (Routine X 2) w Reflex to ID Panel     Status: None (Preliminary result)   Collection Time: 10/20/22  9:13 AM   Specimen: BLOOD RIGHT HAND  Result Value Ref Range Status   Specimen Description   Final    BLOOD RIGHT HAND BOTTLES DRAWN AEROBIC AND ANAEROBIC   Special Requests Blood Culture adequate volume  Final   Culture   Final    NO GROWTH 4 DAYS Performed at [facility], [address], [place], [place] [address]    Report Status PENDING  Incomplete  SARS Coronavirus 2 by RT PCR  (hospital order, performed in [facility] hospital lab) *cepheid single result test* Anterior Nasal Swab     Status: None   Collection Time: 10/20/22  6:00 PM   Specimen: Anterior Nasal Swab  Result Value Ref Range Status   SARS Coronavirus 2 by RT PCR NEGATIVE NEGATIVE Final    Comment: (NOTE) SARS-CoV-2 target nucleic acids are NOT DETECTED.  The SARS-CoV-2 RNA is generally detectable in upper and lower respiratory specimens during the acute phase of infection. The lowest concentration of SARS-CoV-2 viral copies this assay can detect is 250 copies / mL. A negative result does not preclude SARS-CoV-2 infection and should not be used as the sole basis for treatment or other patient  management decisions.  A negative result Fl[place]ner occur with improper specimen collection / handling, submission of specimen other than nasopharyngeal swab, presence of viral mutation(s) within the areas targeted by this assay, and inadequate number of viral copies (<250 copies / mL). A negative result must be combined with clinical observations, patient history, and epidemiological information.  Fact Sheet for Patients:   RoadLapTop.co.za  Fact Sheet for Healthcare Providers: http://kim-miller.com/  This test is not yet approved or  cleared by the [place] FDA and has been authorized for detection and/or diagnosis of SARS-CoV-2 by FDA under an Emergency Use Authorization (EUA).  This EUA will remain in effect (meaning this test can be used) for the duration of the COVID-19 declaration under Section 564(b)(1) of the Act, 21 U.S.C. section 360bbb-3(b)(1), unless the authorization is terminated or revoked sooner.  Performed at [facility], [address], [place], [place] [address]      Scheduled Meds:  enoxaparin (LOVENOX) injection  80 mg Subcutaneous Q24H   furosemide  80 mg Intravenous BID   lactose free nutrition  237 mL Oral QPM   lactulose  30 g  Oral BID   levothyroxine  175 mcg Oral QAC breakfast   pantoprazole  40 mg Oral Daily   potassium chloride SA  40 mEq Oral BID   rifaximin  550 mg Oral BID   sertraline  100 mg Oral Daily   spironolactone  100 mg Oral Daily   spironolactone  50 mg Oral Daily   Continuous Infusions:  albumin human 50 g (10/24/22 1639)    ceFAZolin (ANCEF) IV 2 g (10/24/22 1702)    Procedures/Studies: US Venous Img Lower Unilateral Left (DVT)  Result Date: 10/24/2022 CLINICAL DATA:  Left lower extremity pain and edema. EXAM: LEFT LOWER EXTREMITY VENOUS DOPPLER ULTRASOUND TECHNIQUE: Gray-scale sonography with graded compression, as well as color Doppler and duplex ultrasound were performed to evaluate the lower extremity deep venous systems from the level of the common femoral vein and including the common femoral, femoral, profunda femoral, popliteal and calf veins including the posterior tibial, peroneal and gastrocnemius veins when visible. The superficial great saphenous vein was also interrogated. Spectral Doppler was utilized to evaluate flow at rest and with distal augmentation maneuvers in the common femoral, femoral and popliteal veins. COMPARISON:  Prior study at [facility] [place] on 03/29/2021 FINDINGS: Contralateral Common Femoral Vein: Respiratory phasicity is normal and symmetric with the symptomatic side. No evidence of thrombus. Normal compressibility. Common Femoral Vein: No evidence of thrombus. Normal compressibility, respiratory phasicity and response to augmentation. Saphenofemoral Junction: No evidence of thrombus. Normal compressibility and flow on color Doppler imaging. Profunda Femoral Vein: No evidence of thrombus. Normal compressibility and flow on color Doppler imaging. Femoral Vein: No evidence of thrombus. Normal compressibility, respiratory phasicity and response to augmentation. Popliteal Vein: No evidence of thrombus. Normal compressibility, respiratory phasicity and response to  augmentation. Calf Veins: No evidence of thrombus. Normal compressibility and flow on color Doppler imaging. Superficial Great Saphenous Vein: No evidence of thrombus. Normal compressibility. Venous Reflux:  None. Other Findings: No evidence of superficial thrombophlebitis or abnormal fluid collection. IMPRESSION: No evidence of left lower extremity deep venous thrombosis. Electronically Signed   By: Irish Lack M.D.   On: 10/24/2022 10:42   CT HEAD WO CONTRAST ( )  Result Date: 10/21/2022 CLINICAL DATA:  Delirium, altered level of consciousness EXAM: CT HEAD WITHOUT CONTRAST TECHNIQUE: Contiguous axial images were obtained from the base of the skull through the vertex without intravenous contrast.  RADIATION DOSE REDUCTION: This exam was performed according to the departmental dose-optimization program which includes automated exposure control, adjustment of the mA and/or kV according to patient size and/or use of iterative reconstruction technique. COMPARISON:  08/22/2021 FINDINGS: Brain: No acute infarct or hemorrhage. Lateral ventricles and midline structures are stable. No acute extra-axial fluid collections. No mass effect. Vascular: No hyperdense vessel or unexpected calcification. Skull: Normal. Negative for fracture or focal lesion. Sinuses/Orbits: No acute finding. Other: None. IMPRESSION: 1. No acute intracranial process. Electronically Signed   By: Sharlet Salina M.D.   On: 10/21/2022 16:37   CT ABDOMEN PELVIS WO CONTRAST  Result Date: 10/20/2022 CLINICAL DATA:  Abdominal pain.  Known liver cirrhosis with ascites. EXAM: CT ABDOMEN AND PELVIS WITHOUT IV CONTRAST ( ORAL CONTRAST ONLY ) TECHNIQUE: Multidetector CT imaging of the abdomen and pelvis was performed following the standard protocol without IV contrast, following oral contrast only. RADIATION DOSE REDUCTION: This exam was performed according to the departmental dose-optimization program which includes automated exposure control,  adjustment of the mA and/or kV according to patient size and/or use of iterative reconstruction technique. COMPARISON:  CT with contrast 10/02/2022, CT without contrast 01/22/2022 FINDINGS: Lower chest: Small to moderate-sized right pleural effusion, increased. No left pleural effusion. There is compressive atelectasis in the right lower lobe adjacent the effusion but no lung base infiltrates. Breathing motion limits fine detail.  The cardiac size is normal. Hepatobiliary: The liver is cirrhotic and there is dilatation of the main portal vein which measures 1.9 cm. No mass is seen. Again surgically absent gallbladder without biliary dilatation. Pancreas: No focal abnormality in the unenhanced pancreas. There are generalized mesenteric congestive changes. Some of the edema is seen posterior and inferior to the pancreas and potentially could indicate evidence of pancreatitis, but this was also seen previously. Correlate with serum lipase for possible significance. Spleen: Enlarged measuring 22 cm length. There are splenorenal varices which were better seen with contrast. No focal parenchymal abnormality. Adrenals/Urinary Tract: There is no adrenal mass. There is a 3 mm nonobstructive caliceal stone in the inferior pole right kidney and occasional punctate nonobstructive caliceal stones on the left. There is no contour deforming abnormality of either kidney. No hydronephrosis or ureteral stone. Unremarkable bladder for the degree of distention. Stomach/Bowel: Somewhat thickened folds in the stomach, could be due to nondistention, gastritis or portal gastropathy. There are mildly dilated mid to lower abdominal small bowel loops up to 3 cm caliber, more normal caliber in distal small bowel with no visible transitional segment. Circumferential thickening is again seen in the ascending colon. Rest of the large bowel wall unremarkable. This could be from colitis or portal colopathy. The appendix is normal caliber.  Vascular/Lymphatic: Small varices in the omentum and abdominal wall. Unremarkable unenhanced aorta. Dilated portal vein as described above.  No lymphadenopathy is seen. Reproductive: No prostatomegaly. Other: There is mild patchy ascites in the mesenteric folds, mild upper abdominal and pelvic ascites but no drainable pocket. As above there is generalized mesenteric congestion. Moderate body wall anasarca continues to be seen there is a left inguinal hernia repair. There is a moderate-sized umbilical fat hernia containing scattered fluid in vessels. Musculoskeletal: There is osteopenia and degenerative change of the spine. Severe acquired foraminal stenosis L4-5 and L5-S1. Acquired spinal stenosis L4-5 and L5-S1. No acute or significant osseous findings. IMPRESSION: 1. Cirrhotic liver with dilated portal vein, splenomegaly, splenorenal varices, and mild ascites. 2. Mesenteric congestion and body wall anasarca, similar to the last CT 10/02/2022.  3. Edema posterior and inferior to the pancreas which could be due to pancreatitis or part of the mesenteric congestive change, but this was also seen previously. Correlate with serum lipase. 4. Mildly dilated mid to lower abdominal small bowel loops with more normal caliber in distal small bowel. Findings could be due to ileus or partial obstruction. 5. Circumferential thickening of the ascending colon which could be due to colitis or portal colopathy. 6. Somewhat thickened gastric folds which could be due to nondistention, gastritis or portal gastropathy. 7. Small to moderate-sized right pleural effusion, increased. 8. Nonobstructive nephrolithiasis. 9. Moderate-sized umbilical fat hernia containing scattered fluid and vessels. 10. Osteopenia and degenerative change. Severe acquired foraminal and spinal canal stenosis L4-5 and L5-S1. Electronically Signed   By: Almira Bar M.D.   On: 10/20/2022 22:46   DG Chest 2 View  Result Date: 10/19/2022 CLINICAL DATA:   Shortness of breath. EXAM: CHEST - 2 VIEW COMPARISON:  September 15, 2022. FINDINGS: The heart size and mediastinal contours are within normal limits. Both lungs are clear. The visualized skeletal structures are unremarkable. IMPRESSION: No active cardiopulmonary disease. Electronically Signed   By: Lupita Raider M.D.   On: 10/19/2022 15:08   [place] ASCITES (ABDOMEN LIMITED)  Result Date: 10/19/2022 CLINICAL DATA:  History of cirrhosis and ascites. Evaluation for possible paracentesis. EXAM: LIMITED ABDOMEN ULTRASOUND FOR ASCITES TECHNIQUE: Limited ultrasound survey for ascites was performed in all four abdominal quadrants. COMPARISON:  CT of the abdomen and pelvis on 10/02/2022 FINDINGS: Four quadrant survey of the peritoneal cavity demonstrates no significant ascites. Paracentesis was therefore not performed. IMPRESSION: No significant ascites. Paracentesis was not performed. Electronically Signed   By: Irish Lack M.D.   On: 10/19/2022 14:04   CT ABDOMEN PELVIS W CONTRAST  Result Date: 10/02/2022 CLINICAL DATA:  Left lower abdominal pain, nausea EXAM: CT ABDOMEN AND PELVIS WITH CONTRAST TECHNIQUE: Multidetector CT imaging of the abdomen and pelvis was performed using the standard protocol following bolus administration of intravenous contrast. RADIATION DOSE REDUCTION: This exam was performed according to the departmental dose-optimization program which includes automated exposure control, adjustment of the mA and/or kV according to patient size and/or use of iterative reconstruction technique. CONTRAST:  OMNIPAQUE IOHEXOL 300 MG/ML  SOLN COMPARISON:  01/22/2022 FINDINGS: Lower chest: Small right pleural effusion, new since previous. No pneumothorax. Visualized lung bases clear. Hepatobiliary: Nodular hepatic contour suggesting cirrhosis. No focal liver lesion or biliary ductal dilatation. Cholecystectomy clips. Portal vein patent. Pancreas: No mass or ductal dilatation. Increase in retroperitoneal  inflammatory/edematous changes predominately posterior and inferior to the pancreas. No pseudocyst. Spleen: Splenomegaly 21.4 cm craniocaudal length. No focal lesion. Relatively small splenorenal venous shunt. Adrenals/Urinary Tract: No adrenal mass. Symmetric renal parenchymal enhancement. 4 mm right lower pole renal calculus. No hydronephrosis. Urinary bladder is nondistended. Stomach/Bowel: Core small esophageal varices. The stomach is nondistended, without acute finding. Small bowel nondilated. Normal appendix. Colon incompletely distended, with suggestion of circumferential wall thickening in multiple segments, focal lesion evident. Vascular/Lymphatic: No AAA. Portal vein patent. Small esophageal varices. IVC patent. Reproductive: Prostate is unremarkable. Other: Small volume predominantly perihepatic ascites.  No free air. Musculoskeletal: Body wall anasarca most marked anteriorly. Moderate paraumbilical hernia containing only mesenteric fat and vessels, no bowel. Sutures of laparoscopic left inguinal hernia repair. Mild multilevel spondylitic changes in the lumbar spine. IMPRESSION: 1. Nonspecific retroperitoneal inflammatory/edematous changes predominately posterior and inferior to the pancreas. Correlate with amylase and lipase to exclude pancreatitis. 2. Cirrhosis with splenomegaly, small volume ascites,  and small esophageal varices. 3. Small right pleural effusion, new since previous. 4. Nonobstructive right nephrolithiasis. 5. Moderate paraumbilical hernia containing mesenteric fat and vessels, no bowel. Electronically Signed   By: Corlis Leak M.D.   On: 10/02/2022 16:51    Vassie Loll, MD [facility]  If 7PM-7AM, please contact night-coverage [email or web address] Password [facility] 10/24/2022, 6:19 PM   LOS: 5 days

## 2022-10-24 NOTE — Progress Notes (Signed)
Gastroenterology Progress Note   Referring Provider: No ref. provider found Primary Care Physician:  [facility] Primary Gastroenterologist: Dolores Frame, MD   Patient ID: Daniel Crawford [id number]; [birth year]   Subjective:    Appetite fair. No n/v. Left leg swelling increased and red. Just had venous doppler completed. Only one stool so far today since decreased lactulose dosage. No confusion. No abdominal pain, melena, brbpr.   Objective:   Vital signs in last 24 hours: Temp:  [97.7 F (36.5 C)-98.7 F (37.1 C)] 97.7 F (36.5 C) (07/31 0441) Pulse Rate:  [86-98] 86 (07/31 0441) Resp:  [18-20] 18 (07/31 0441) BP: (113-123)/(57-70) 122/67 (07/31 0441) SpO2:  [96 %-100 %] 96 % (07/31 0441) FiO2 (%):  [2 %] 2 % (07/31 0441) Weight:  [181.9 kg] 181.9 kg (07/31 0500) Last BM Date : 10/23/22 General:   Alert, pleasant and cooperative in NAD Head:  Normocephalic and atraumatic. Eyes:  Sclera clear, no icterus.   Abdomen:  Soft, nontender, obese. Normal bowel sounds, without guarding, and without rebound.   Extremities:  bilaterally extensive lower extremity edema, left > right. Left leg with mild erythema. Patient requested I not palpate his legs due to discomfort.  Neurologic:  Alert and  oriented x4;  grossly normal neurologically. No Asterixis. Skin:  left leg cellulitis Psych:  Alert and cooperative. Normal mood and affect.  Intake/Output from previous day: 07/30 0701 - 07/31 0700 In: 1839.9 [P.O.:840; IV Piggyback:999.9] Out: 5000 [Urine:5000] Intake/Output this shift: No intake/output data recorded.  Lab Results: CBC Recent Labs    10/22/22 0508 10/24/22 0556  WBC 9.0 5.9  HGB 9.0* 8.5*  HCT 28.5* 25.7*  MCV 92.8 91.8  PLT 57* 61*   BMET Recent Labs    10/22/22 0508 10/23/22 0419 10/24/22 0353  NA 131* 131* 133*  K 4.5 4.1 4.1  CL 95* 96* 95*  CO2 26 30 28   GLUCOSE 150* 126* 115*  BUN 25* 26* 24*  CREATININE 1.55*  1.37* 1.31*  CALCIUM 8.1* 8.0* 8.1*   LFTs Recent Labs    10/22/22 0508 10/23/22 0419 10/24/22 0353  BILITOT 9.1* 6.0* 4.2*  ALKPHOS 75 70 66  AST 40 34 36  ALT 21 18 16   PROT 5.9* 5.8* 6.1*  ALBUMIN 2.8* 2.7* 2.9*   No results for input(s): "LIPASE" in the last 72 hours. PT/INR Recent Labs    10/22/22 1432 10/23/22 1159 10/24/22 0353  LABPROT 20.7* 19.0* 18.5*  INR 1.8* 1.6* 1.5*         Imaging Studies: CT HEAD WO CONTRAST ( )  Result Date: 10/21/2022 CLINICAL DATA:  Delirium, altered level of consciousness EXAM: CT HEAD WITHOUT CONTRAST TECHNIQUE: Contiguous axial images were obtained from the base of the skull through the vertex without intravenous contrast. RADIATION DOSE REDUCTION: This exam was performed according to the departmental dose-optimization program which includes automated exposure control, adjustment of the mA and/or kV according to patient size and/or use of iterative reconstruction technique. COMPARISON:  08/22/2021 FINDINGS: Brain: No acute infarct or hemorrhage. Lateral ventricles and midline structures are stable. No acute extra-axial fluid collections. No mass effect. Vascular: No hyperdense vessel or unexpected calcification. Skull: Normal. Negative for fracture or focal lesion. Sinuses/Orbits: No acute finding. Other: None. IMPRESSION: 1. No acute intracranial process. Electronically Signed   By: Sharlet Salina M.D.   On: 10/21/2022 16:37   CT ABDOMEN PELVIS WO CONTRAST  Result Date: 10/20/2022 CLINICAL DATA:  Abdominal pain.  Known liver cirrhosis with ascites.  EXAM: CT ABDOMEN AND PELVIS WITHOUT IV CONTRAST ( ORAL CONTRAST ONLY ) TECHNIQUE: Multidetector CT imaging of the abdomen and pelvis was performed following the standard protocol without IV contrast, following oral contrast only. RADIATION DOSE REDUCTION: This exam was performed according to the departmental dose-optimization program which includes automated exposure control, adjustment of the  mA and/or kV according to patient size and/or use of iterative reconstruction technique. COMPARISON:  CT with contrast 10/02/2022, CT without contrast 01/22/2022 FINDINGS: Lower chest: Small to moderate-sized right pleural effusion, increased. No left pleural effusion. There is compressive atelectasis in the right lower lobe adjacent the effusion but no lung base infiltrates. Breathing motion limits fine detail.  The cardiac size is normal. Hepatobiliary: The liver is cirrhotic and there is dilatation of the main portal vein which measures 1.9 cm. No mass is seen. Again surgically absent gallbladder without biliary dilatation. Pancreas: No focal abnormality in the unenhanced pancreas. There are generalized mesenteric congestive changes. Some of the edema is seen posterior and inferior to the pancreas and potentially could indicate evidence of pancreatitis, but this was also seen previously. Correlate with serum lipase for possible significance. Spleen: Enlarged measuring 22 cm length. There are splenorenal varices which were better seen with contrast. No focal parenchymal abnormality. Adrenals/Urinary Tract: There is no adrenal mass. There is a 3 mm nonobstructive caliceal stone in the inferior pole right kidney and occasional punctate nonobstructive caliceal stones on the left. There is no contour deforming abnormality of either kidney. No hydronephrosis or ureteral stone. Unremarkable bladder for the degree of distention. Stomach/Bowel: Somewhat thickened folds in the stomach, could be due to nondistention, gastritis or portal gastropathy. There are mildly dilated mid to lower abdominal small bowel loops up to 3 cm caliber, more normal caliber in distal small bowel with no visible transitional segment. Circumferential thickening is again seen in the ascending colon. Rest of the large bowel wall unremarkable. This could be from colitis or portal colopathy. The appendix is normal caliber. Vascular/Lymphatic: Small  varices in the omentum and abdominal wall. Unremarkable unenhanced aorta. Dilated portal vein as described above.  No lymphadenopathy is seen. Reproductive: No prostatomegaly. Other: There is mild patchy ascites in the mesenteric folds, mild upper abdominal and pelvic ascites but no drainable pocket. As above there is generalized mesenteric congestion. Moderate body wall anasarca continues to be seen there is a left inguinal hernia repair. There is a moderate-sized umbilical fat hernia containing scattered fluid in vessels. Musculoskeletal: There is osteopenia and degenerative change of the spine. Severe acquired foraminal stenosis L4-5 and L5-S1. Acquired spinal stenosis L4-5 and L5-S1. No acute or significant osseous findings. IMPRESSION: 1. Cirrhotic liver with dilated portal vein, splenomegaly, splenorenal varices, and mild ascites. 2. Mesenteric congestion and body wall anasarca, similar to the last CT 10/02/2022. 3. Edema posterior and inferior to the pancreas which could be due to pancreatitis or part of the mesenteric congestive change, but this was also seen previously. Correlate with serum lipase. 4. Mildly dilated mid to lower abdominal small bowel loops with more normal caliber in distal small bowel. Findings could be due to ileus or partial obstruction. 5. Circumferential thickening of the ascending colon which could be due to colitis or portal colopathy. 6. Somewhat thickened gastric folds which could be due to nondistention, gastritis or portal gastropathy. 7. Small to moderate-sized right pleural effusion, increased. 8. Nonobstructive nephrolithiasis. 9. Moderate-sized umbilical fat hernia containing scattered fluid and vessels. 10. Osteopenia and degenerative change. Severe acquired foraminal and spinal canal stenosis  L4-5 and L5-S1. Electronically Signed   By: Almira Bar M.D.   On: 10/20/2022 22:46   DG Chest 2 View  Result Date: 10/19/2022 CLINICAL DATA:  Shortness of breath. EXAM: CHEST -  2 VIEW COMPARISON:  September 15, 2022. FINDINGS: The heart size and mediastinal contours are within normal limits. Both lungs are clear. The visualized skeletal structures are unremarkable. IMPRESSION: No active cardiopulmonary disease. Electronically Signed   By: Lupita Raider M.D.   On: 10/19/2022 15:08   [place] ASCITES (ABDOMEN LIMITED)  Result Date: 10/19/2022 CLINICAL DATA:  History of cirrhosis and ascites. Evaluation for possible paracentesis. EXAM: LIMITED ABDOMEN ULTRASOUND FOR ASCITES TECHNIQUE: Limited ultrasound survey for ascites was performed in all four abdominal quadrants. COMPARISON:  CT of the abdomen and pelvis on 10/02/2022 FINDINGS: Four quadrant survey of the peritoneal cavity demonstrates no significant ascites. Paracentesis was therefore not performed. IMPRESSION: No significant ascites. Paracentesis was not performed. Electronically Signed   By: Irish Lack M.D.   On: 10/19/2022 14:04   CT ABDOMEN PELVIS W CONTRAST  Result Date: 10/02/2022 CLINICAL DATA:  Left lower abdominal pain, nausea EXAM: CT ABDOMEN AND PELVIS WITH CONTRAST TECHNIQUE: Multidetector CT imaging of the abdomen and pelvis was performed using the standard protocol following bolus administration of intravenous contrast. RADIATION DOSE REDUCTION: This exam was performed according to the departmental dose-optimization program which includes automated exposure control, adjustment of the mA and/or kV according to patient size and/or use of iterative reconstruction technique. CONTRAST:  OMNIPAQUE IOHEXOL 300 MG/ML  SOLN COMPARISON:  01/22/2022 FINDINGS: Lower chest: Small right pleural effusion, new since previous. No pneumothorax. Visualized lung bases clear. Hepatobiliary: Nodular hepatic contour suggesting cirrhosis. No focal liver lesion or biliary ductal dilatation. Cholecystectomy clips. Portal vein patent. Pancreas: No mass or ductal dilatation. Increase in retroperitoneal inflammatory/edematous changes  predominately posterior and inferior to the pancreas. No pseudocyst. Spleen: Splenomegaly 21.4 cm craniocaudal length. No focal lesion. Relatively small splenorenal venous shunt. Adrenals/Urinary Tract: No adrenal mass. Symmetric renal parenchymal enhancement. 4 mm right lower pole renal calculus. No hydronephrosis. Urinary bladder is nondistended. Stomach/Bowel: Core small esophageal varices. The stomach is nondistended, without acute finding. Small bowel nondilated. Normal appendix. Colon incompletely distended, with suggestion of circumferential wall thickening in multiple segments, focal lesion evident. Vascular/Lymphatic: No AAA. Portal vein patent. Small esophageal varices. IVC patent. Reproductive: Prostate is unremarkable. Other: Small volume predominantly perihepatic ascites.  No free air. Musculoskeletal: Body wall anasarca most marked anteriorly. Moderate paraumbilical hernia containing only mesenteric fat and vessels, no bowel. Sutures of laparoscopic left inguinal hernia repair. Mild multilevel spondylitic changes in the lumbar spine. IMPRESSION: 1. Nonspecific retroperitoneal inflammatory/edematous changes predominately posterior and inferior to the pancreas. Correlate with amylase and lipase to exclude pancreatitis. 2. Cirrhosis with splenomegaly, small volume ascites, and small esophageal varices. 3. Small right pleural effusion, new since previous. 4. Nonobstructive right nephrolithiasis. 5. Moderate paraumbilical hernia containing mesenteric fat and vessels, no bowel. Electronically Signed   By: Corlis Leak M.D.   On: 10/02/2022 16:51  [2 weeks]  Assessment:   67 y.o. 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    Possible Colitis on  CT: Circumferential thickening of the ascending colon noted on CT.  Likely secondary to portal colopathy rather than colitis. Patient  did not have diarrhea prior to admission.  Current diarrhea in the setting of lactulose. He was started on empiric IV antibiotics, initially on unasyn. Was transitioned to cefazolin yesterday due to concern for leg cellulitis. Colonoscopy completed 06/2021 but cecal polyp not removed due to poor approach.   Decompensated MASH cirrhosis: MELD 3.0 of 22 today (down from 26). He is not a transplant candidate due to multiple comorbidities. He had been transitioned to Hospice as outpatient, but he then desired to return to the palliative pathway.  Palliative is currently following inpatient.     Continues with significant anasarca.  No significant ascites on para last Friday. He was down twenty pounds from 7/26 to yesterday. Today weight up 15 pounds????.  I/Os with net -3160 last 24 hours. Currently on Lasix 80 mg IV twice daily and spironolactone 100 mg daily.  Kidney function is remaining stable/slightly improved today with creatinine 1.31.   Patient had an episode of encephalopathy noted on 7/27.  There was concern about possible HE, but ammonia levels were within normal limits.He was started on lactulose. Titrate with a goal of 3-4 bowel movements per day.   Abdominal imaging up-to-date.  AFP tumor marker normal recently.     Ultimately, he is due for EGD for variceal screening and early interval colonoscopy as cecal polyp not removed last April. Pulmonary has deemed him moderate to high risk for respiratory failure. He is a poor candidate for anesthesia. Conservative measures recommended at this time.       Plan:   Continue lasix 80mg  BID. Will increase spironolactone to 100mg  AM and 50mg  PM. Albumin ordered for today. Continue lactulose, goal of 3-4 BMs daily. Continue xifaxan BID. Continue 2 g sodium diet.  Recheck today's weight.  Check renal function  tomorrow.    LOS: 5 days   Leanna Battles. Dixon Boos [facility] [phone] 7/31/20247:44 AM

## 2022-10-25 DIAGNOSIS — E119 Type 2 diabetes mellitus without complications: Secondary | ICD-10-CM | POA: Diagnosis not present

## 2022-10-25 DIAGNOSIS — R601 Generalized edema: Secondary | ICD-10-CM | POA: Diagnosis not present

## 2022-10-25 DIAGNOSIS — K7581 Nonalcoholic steatohepatitis (NASH): Secondary | ICD-10-CM | POA: Diagnosis not present

## 2022-10-25 LAB — CULTURE, BLOOD (ROUTINE X 2)
Culture: NO GROWTH
Culture: NO GROWTH
Special Requests: ADEQUATE
Special Requests: ADEQUATE

## 2022-10-25 MED ORDER — ROPINIROLE HCL 0.25 MG PO TABS
0.5000 mg | ORAL_TABLET | Freq: Three times a day (TID) | ORAL | Status: DC | PRN
  Administered 2022-10-25 (×2): 0.5 mg via ORAL
  Filled 2022-10-25 (×2): qty 2

## 2022-10-25 MED ORDER — FUROSEMIDE 40 MG PO TABS
40.0000 mg | ORAL_TABLET | Freq: Two times a day (BID) | ORAL | Status: DC
  Administered 2022-10-25 – 2022-10-26 (×3): 40 mg via ORAL
  Filled 2022-10-25 (×3): qty 1

## 2022-10-25 MED ORDER — ORAL CARE MOUTH RINSE: 15.0000 mL | OROMUCOSAL | Status: DC | PRN

## 2022-10-25 NOTE — Progress Notes (Signed)
Gastroenterology Progress Note   Referring Provider: No ref. provider found Primary Care Physician:  [facility] Primary Gastroenterologist:  Katrinka Blazing, MD   Patient ID: Daniel Crawford [id number]; [birth year]   Subjective:    Feels a lot better. He hopes to go home in the next 24 hours. Breathing improved. Legs feel less swollen. No abdominal pain. Four stools already today per patient. He declined lactulose this morning.    Objective:   Vital signs in last 24 hours: Temp:  [98.1 F (36.7 C)-98.7 F (37.1 C)] 98.7 F (37.1 C) (08/01 0427) Pulse Rate:  [79-86] 86 (08/01 0427) Resp:  [20-22] 20 (08/01 0427) BP: (122-145)/(62-75) 145/75 (08/01 0427) SpO2:  [98 %-100 %] 98 % (08/01 0427) Weight:  [166 kg-180.8 kg] 166 kg (08/01 0500) Last BM Date : 10/23/22 General:   Alert, pleasant and cooperative in NAD Head:  Normocephalic and atraumatic. Eyes:  Sclera clear, no icterus.   Abdomen:  Soft, nontender, obese. Normal bowel sounds, without guarding, and without rebound.   Extremities: bilateral lower ext edema, left >right, improved somewhat from yesterday, 2-3+ pitting. Neurologic:  Alert and  oriented x4;  grossly normal neurologically. Skin:  Intact without significant lesions. Left leg cellulitis improved Psych:  Alert and cooperative. Normal mood and affect.  Intake/Output from previous day: 07/31 0701 - 08/01 0700 In: 240 [P.O.:240] Out: 2900 [Urine:2900] Intake/Output this shift: Total I/O In: 200 [P.O.:200] Out: 850 [Urine:850]  Lab Results: CBC Recent Labs    10/24/22 0556  WBC 5.9  HGB 8.5*  HCT 25.7*  MCV 91.8  PLT 61*   BMET Recent Labs    10/23/22 0419 10/24/22 0353 10/25/22 0353  NA 131* 133* 135  K 4.1 4.1 3.8  CL 96* 95* 95*  CO2 30 28 31   GLUCOSE 126* 115* 109*  BUN 26* 24* 20  CREATININE 1.37* 1.31* 1.24  CALCIUM 8.0* 8.1* 8.4*   LFTs Recent Labs    10/23/22 0419 10/24/22 0353  BILITOT 6.0* 4.2*   ALKPHOS 70 66  AST 34 36  ALT 18 16  PROT 5.8* 6.1*  ALBUMIN 2.7* 2.9*   No results for input(s): "LIPASE" in the last 72 hours. PT/INR Recent Labs    10/22/22 1432 10/23/22 1159 10/24/22 0353  LABPROT 20.7* 19.0* 18.5*  INR 1.8* 1.6* 1.5*         Imaging Studies: US Venous Img Lower Unilateral Left (DVT)  Result Date: 10/24/2022 CLINICAL DATA:  Left lower extremity pain and edema. EXAM: LEFT LOWER EXTREMITY VENOUS DOPPLER ULTRASOUND TECHNIQUE: Gray-scale sonography with graded compression, as well as color Doppler and duplex ultrasound were performed to evaluate the lower extremity deep venous systems from the level of the common femoral vein and including the common femoral, femoral, profunda femoral, popliteal and calf veins including the posterior tibial, peroneal and gastrocnemius veins when visible. The superficial great saphenous vein was also interrogated. Spectral Doppler was utilized to evaluate flow at rest and with distal augmentation maneuvers in the common femoral, femoral and popliteal veins. COMPARISON:  Prior study at [facility] on 03/29/2021 FINDINGS: Contralateral Common Femoral Vein: Respiratory phasicity is normal and symmetric with the symptomatic side. No evidence of thrombus. Normal compressibility. Common Femoral Vein: No evidence of thrombus. Normal compressibility, respiratory phasicity and response to augmentation. Saphenofemoral Junction: No evidence of thrombus. Normal compressibility and flow on color Doppler imaging. Profunda Femoral Vein: No evidence of thrombus. Normal compressibility and flow on color Doppler imaging. Femoral Vein: No evidence of  thrombus. Normal compressibility, respiratory phasicity and response to augmentation. Popliteal Vein: No evidence of thrombus. Normal compressibility, respiratory phasicity and response to augmentation. Calf Veins: No evidence of thrombus. Normal compressibility and flow on color Doppler imaging. Superficial  Great Saphenous Vein: No evidence of thrombus. Normal compressibility. Venous Reflux:  None. Other Findings: No evidence of superficial thrombophlebitis or abnormal fluid collection. IMPRESSION: No evidence of left lower extremity deep venous thrombosis. Electronically Signed   By: Irish Lack M.D.   On: 10/24/2022 10:42   CT HEAD WO CONTRAST ( )  Result Date: 10/21/2022 CLINICAL DATA:  Delirium, altered level of consciousness EXAM: CT HEAD WITHOUT CONTRAST TECHNIQUE: Contiguous axial images were obtained from the base of the skull through the vertex without intravenous contrast. RADIATION DOSE REDUCTION: This exam was performed according to the departmental dose-optimization program which includes automated exposure control, adjustment of the mA and/or kV according to patient size and/or use of iterative reconstruction technique. COMPARISON:  08/22/2021 FINDINGS: Brain: No acute infarct or hemorrhage. Lateral ventricles and midline structures are stable. No acute extra-axial fluid collections. No mass effect. Vascular: No hyperdense vessel or unexpected calcification. Skull: Normal. Negative for fracture or focal lesion. Sinuses/Orbits: No acute finding. Other: None. IMPRESSION: 1. No acute intracranial process. Electronically Signed   By: Sharlet Salina M.D.   On: 10/21/2022 16:37   CT ABDOMEN PELVIS WO CONTRAST  Result Date: 10/20/2022 CLINICAL DATA:  Abdominal pain.  Known liver cirrhosis with ascites. EXAM: CT ABDOMEN AND PELVIS WITHOUT IV CONTRAST ( ORAL CONTRAST ONLY ) TECHNIQUE: Multidetector CT imaging of the abdomen and pelvis was performed following the standard protocol without IV contrast, following oral contrast only. RADIATION DOSE REDUCTION: This exam was performed according to the departmental dose-optimization program which includes automated exposure control, adjustment of the mA and/or kV according to patient size and/or use of iterative reconstruction technique. COMPARISON:  CT  with contrast 10/02/2022, CT without contrast 01/22/2022 FINDINGS: Lower chest: Small to moderate-sized right pleural effusion, increased. No left pleural effusion. There is compressive atelectasis in the right lower lobe adjacent the effusion but no lung base infiltrates. Breathing motion limits fine detail.  The cardiac size is normal. Hepatobiliary: The liver is cirrhotic and there is dilatation of the main portal vein which measures 1.9 cm. No mass is seen. Again surgically absent gallbladder without biliary dilatation. Pancreas: No focal abnormality in the unenhanced pancreas. There are generalized mesenteric congestive changes. Some of the edema is seen posterior and inferior to the pancreas and potentially could indicate evidence of pancreatitis, but this was also seen previously. Correlate with serum lipase for possible significance. Spleen: Enlarged measuring 22 cm length. There are splenorenal varices which were better seen with contrast. No focal parenchymal abnormality. Adrenals/Urinary Tract: There is no adrenal mass. There is a 3 mm nonobstructive caliceal stone in the inferior pole right kidney and occasional punctate nonobstructive caliceal stones on the left. There is no contour deforming abnormality of either kidney. No hydronephrosis or ureteral stone. Unremarkable bladder for the degree of distention. Stomach/Bowel: Somewhat thickened folds in the stomach, could be due to nondistention, gastritis or portal gastropathy. There are mildly dilated mid to lower abdominal small bowel loops up to 3 cm caliber, more normal caliber in distal small bowel with no visible transitional segment. Circumferential thickening is again seen in the ascending colon. Rest of the large bowel wall unremarkable. This could be from colitis or portal colopathy. The appendix is normal caliber. Vascular/Lymphatic: Small varices in the omentum and abdominal  wall. Unremarkable unenhanced aorta. Dilated portal vein as described  above.  No lymphadenopathy is seen. Reproductive: No prostatomegaly. Other: There is mild patchy ascites in the mesenteric folds, mild upper abdominal and pelvic ascites but no drainable pocket. As above there is generalized mesenteric congestion. Moderate body wall anasarca continues to be seen there is a left inguinal hernia repair. There is a moderate-sized umbilical fat hernia containing scattered fluid in vessels. Musculoskeletal: There is osteopenia and degenerative change of the spine. Severe acquired foraminal stenosis L4-5 and L5-S1. Acquired spinal stenosis L4-5 and L5-S1. No acute or significant osseous findings. IMPRESSION: 1. Cirrhotic liver with dilated portal vein, splenomegaly, splenorenal varices, and mild ascites. 2. Mesenteric congestion and body wall anasarca, similar to the last CT 10/02/2022. 3. Edema posterior and inferior to the pancreas which could be due to pancreatitis or part of the mesenteric congestive change, but this was also seen previously. Correlate with serum lipase. 4. Mildly dilated mid to lower abdominal small bowel loops with more normal caliber in distal small bowel. Findings could be due to ileus or partial obstruction. 5. Circumferential thickening of the ascending colon which could be due to colitis or portal colopathy. 6. Somewhat thickened gastric folds which could be due to nondistention, gastritis or portal gastropathy. 7. Small to moderate-sized right pleural effusion, increased. 8. Nonobstructive nephrolithiasis. 9. Moderate-sized umbilical fat hernia containing scattered fluid and vessels. 10. Osteopenia and degenerative change. Severe acquired foraminal and spinal canal stenosis L4-5 and L5-S1. Electronically Signed   By: Almira Bar M.D.   On: 10/20/2022 22:46   DG Chest 2 View  Result Date: 10/19/2022 CLINICAL DATA:  Shortness of breath. EXAM: CHEST - 2 VIEW COMPARISON:  September 15, 2022. FINDINGS: The heart size and mediastinal contours are within normal  limits. Both lungs are clear. The visualized skeletal structures are unremarkable. IMPRESSION: No active cardiopulmonary disease. Electronically Signed   By: Lupita Raider M.D.   On: 10/19/2022 15:08   [place] ASCITES (ABDOMEN LIMITED)  Result Date: 10/19/2022 CLINICAL DATA:  History of cirrhosis and ascites. Evaluation for possible paracentesis. EXAM: LIMITED ABDOMEN ULTRASOUND FOR ASCITES TECHNIQUE: Limited ultrasound survey for ascites was performed in all four abdominal quadrants. COMPARISON:  CT of the abdomen and pelvis on 10/02/2022 FINDINGS: Four quadrant survey of the peritoneal cavity demonstrates no significant ascites. Paracentesis was therefore not performed. IMPRESSION: No significant ascites. Paracentesis was not performed. Electronically Signed   By: Irish Lack M.D.   On: 10/19/2022 14:04   CT ABDOMEN PELVIS W CONTRAST  Result Date: 10/02/2022 CLINICAL DATA:  Left lower abdominal pain, nausea EXAM: CT ABDOMEN AND PELVIS WITH CONTRAST TECHNIQUE: Multidetector CT imaging of the abdomen and pelvis was performed using the standard protocol following bolus administration of intravenous contrast. RADIATION DOSE REDUCTION: This exam was performed according to the departmental dose-optimization program which includes automated exposure control, adjustment of the mA and/or kV according to patient size and/or use of iterative reconstruction technique. CONTRAST:  OMNIPAQUE IOHEXOL 300 MG/ML  SOLN COMPARISON:  01/22/2022 FINDINGS: Lower chest: Small right pleural effusion, new since previous. No pneumothorax. Visualized lung bases clear. Hepatobiliary: Nodular hepatic contour suggesting cirrhosis. No focal liver lesion or biliary ductal dilatation. Cholecystectomy clips. Portal vein patent. Pancreas: No mass or ductal dilatation. Increase in retroperitoneal inflammatory/edematous changes predominately posterior and inferior to the pancreas. No pseudocyst. Spleen: Splenomegaly 21.4 cm craniocaudal  length. No focal lesion. Relatively small splenorenal venous shunt. Adrenals/Urinary Tract: No adrenal mass. Symmetric renal parenchymal enhancement. 4 mm  right lower pole renal calculus. No hydronephrosis. Urinary bladder is nondistended. Stomach/Bowel: Core small esophageal varices. The stomach is nondistended, without acute finding. Small bowel nondilated. Normal appendix. Colon incompletely distended, with suggestion of circumferential wall thickening in multiple segments, focal lesion evident. Vascular/Lymphatic: No AAA. Portal vein patent. Small esophageal varices. IVC patent. Reproductive: Prostate is unremarkable. Other: Small volume predominantly perihepatic ascites.  No free air. Musculoskeletal: Body wall anasarca most marked anteriorly. Moderate paraumbilical hernia containing only mesenteric fat and vessels, no bowel. Sutures of laparoscopic left inguinal hernia repair. Mild multilevel spondylitic changes in the lumbar spine. IMPRESSION: 1. Nonspecific retroperitoneal inflammatory/edematous changes predominately posterior and inferior to the pancreas. Correlate with amylase and lipase to exclude pancreatitis. 2. Cirrhosis with splenomegaly, small volume ascites, and small esophageal varices. 3. Small right pleural effusion, new since previous. 4. Nonobstructive right nephrolithiasis. 5. Moderate paraumbilical hernia containing mesenteric fat and vessels, no bowel. Electronically Signed   By: Corlis Leak M.D.   On: 10/02/2022 16:51  [2 weeks]  Assessment:   67 y.o. 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    Possible Colitis on CT: Circumferential thickening of the ascending colon noted on CT.  Likely secondary to portal colopathy, rather than  colitis. Patient did not have diarrhea prior to admission.  Current diarrhea in the setting of lactulose. He was started on empiric IV antibiotics, initially on unasyn. Was transitioned to cefazolin yesterday due to concern for leg cellulitis. Colonoscopy completed 06/2021 but cecal polyp not removed due to poor approach.   Decompensated MASH cirrhosis: MELD 3.0 of 22 today (down from 26). He is not a transplant candidate due to multiple comorbidities. He had been transitioned to Hospice as outpatient, but he then desired to return to the palliative pathway.  Palliative is currently following inpatient.     Anasarca still significant but much improved, has diuresed around 50 pounds. No significant ascites on para last Friday. Currently on Lasix 80 mg IV twice daily and spironolactone increased to 150mg  daily yesterday.  Kidney function improved today, actually with normal creatinine.   Patient had an episode of encephalopathy noted on 7/27.  There was concern about possible HE, but ammonia levels were within normal limits.He was started on lactulose. Titrate with a goal of 3-4 bowel movements per day.   Abdominal imaging up-to-date.  AFP tumor marker normal recently.     Ultimately, he is due for EGD for variceal screening and early interval colonoscopy as cecal polyp not removed last April. Pulmonary has deemed him moderate to high risk for respiratory failure. He is a poor candidate for anesthesia. Conservative measures recommended at this time.       Plan:   Consider transitioning to oral lasix, continue spironolactone.  Repeat CMET, PT/INR AM Continue lactulose titration with goal of 3-4 stools daily. Continue Xifaxan 550mg  BID. Continue 2 g sodium diet.  He will need short interval follow up upon discharge with labs, office visit.    LOS: 6 days   Leanna Battles. [facility] [phone] 8/1/202411:24 AM

## 2022-10-25 NOTE — Plan of Care (Signed)
  Problem: Education: Goal: Ability to demonstrate management of disease process will improve Outcome: Progressing Goal: Ability to verbalize understanding of medication therapies will improve Outcome: Progressing   

## 2022-10-25 NOTE — Progress Notes (Signed)
PROGRESS NOTE  Daniel Crawford VWU:[id number] DOB: Mar 30, 1955 DOA: 10/19/2022 PCP: [facility] P[facility]  Brief History:  67 year old male with a history of diabetes mellitus type 2, hypertension, NASH liver cirrhosis, hypothyroidism, CKD stage III, pancytopenia, depression/anxiety, OSA presenting with shortness of breath and orthopnea type symptoms.  The patient was at [facility] for evaluation of paracentesis.  There was not enough ascites for paracentesis.  However, he had stated that he was having more shortness of breath and dyspnea on exertion and orthopnea.  As result, the patient was taken the emergency department for further evaluation and treatment.  He endorses compliance with all his medications at home.  However, he notes increasing edema and abdominal swelling. Notably, the patient was recently mated to the hospital from 10/02/2022 to 10/05/2022 for fluid overload.  He was discharged home with torsemide 60 mg twice daily.  He denies any fevers, chills, chest pain, nausea, vomiting, diarrhea.  There is no hematochezia or melena. In the ED, the patient had low-grade temperature 100.4 F.  Oxygen saturation was 97-100% room air.  WBC 5.8, hemoglobin 10.1, platelets 76,000.  Sodium 135, potassium 3.7, bicarbonate 30, serum creatinine 1.56.  AST 42, ALT 21, alk phosphatase 83, total bilirubin 6.0.  Chest x-ray was negative for any acute findings.  Patient was started on IV furosemide.  In the morning of 10/20/2022, the patient was somewhat slow to respond to questions.  As result, there was concern for hepatic encephalopathy.  Ammonia level was checked.  His lactulose dose was increased to q 6hrs  The patient developed low-grade fever up to 101.1 F.  In addition, the patient did not have any bowel movements with increased dose of lactulose.  CT of the abdomen and pelvis was obtained and showed mild ascites.  There is mesenteric congestion and body wall anasarca similar  to 10/02/2022.  There was mildly dilated lower abdominal small bowel loops.  There was circumferential thickening of the ascending colon felt to be a portal colopathy versus colitis.  The patient continued to remain confused. Finally on 10/22/22 patient began having BMs with improvement of his mentation.   Assessment/Plan: Decompensated liver cirrhosis with anasarca -remains clinically fluid overloaded -many inconsistencies regarding how he takes torsemide at home -suspect a degree of dietary/fluid indiscretion contributing to his frequent decompensations -Continue low-sodium diet -Continue to follow daily weights and strict I's and O's. -Patient is not felt to be a TIPS or transplant candidate secondary to comorbidities -continue rifaximin and the use of lactulose -previously evaluated by [facility] for liver transplant, though not a candidate due to his BMI, multi-morbidities.   -Looking to transition diuretics to oral route and assessed urine output and response; continue close monitoring of daily weights and strict I's and O's.   -Continue adjusted dose of spironolactone as per GI recommendations. -Continue to follow GI service recommendation.   Acute metabolic encephalopathy/hepatic encephalopathy -10/21/22 AM--remains confused despite increased lactulose dose and "normal ammonia" -check ammonia 31>>35 -increased lactulose to 30 q 6  hrs >>now 5 BMs in last 24 hours -continue rifaximin -UA--neg for pyuria -check COVID--neg -d/c oxycodone -CT brain--neg -VBG--7.48/47/42/34 -B12--1139 -folate 15.1 -TSH 8.858, Free T4 0.99>>euthyroid sick; with plan to repeat thyroid panel as an outpatient. -Mentation back to baseline.   Ileus -initially no BMs with increased dose of lactulose -no BM with lactulose first 48 hour>>>now 5 BMs in last 24 hours -10/20/22 CT abd--mesenteric congestion and body wall anasarca similar to  10/02/2022.  There was mildly dilated lower abdominal small bowel loops.  There  was circumferential thickening of the ascending colon felt to be a portal colopathy versus colitis.  -improved  -Adjusting lactulose in order to achieve 2-3 bowel movements daily -Patient mentation within normal limits.  Cellulitis left leg -Continue cefazolin; wounds or active drainage appreciated. -venous duplex not demonstrating acute DVT.   Portal HTN colopathy -cannot rule out colitis on CT -GI doubts colitis -Continue supportive care and follow GI recommendations.   CKD 3a -baseline creatinine 1.2-1.6 -Renal function has remained stable; continue to follow creatinine trend with the use of Lasix.   Pancytopenia -due to liver cirrhosis -No overt bleeding appreciated -Continue to follow blood counts   Diabetes mellitus type 2 -not on any agents at home -check A1C--5.1 -Continue modified carbohydrate diet.   Essential hypertension -Continue holding coreg temporarily to allow BP margin for diuresis   Hypothyroidism -Continue Synthroid -Outpatient thyroid panel follow-up recommended.   Depression/anxiety -Continue Zoloft/hydroxyzine -Stable mood.   Morbid Obesity -BMI 52.17 -Low-calorie diet and portion control discussed with patient.  Legs cramping -As needed Requip will be provided -Continue to closely follow electrolytes while actively diuresing and replete as needed -Follow clinical response.        Family Communication: advocate and brother updated 7/28   Consultants:  palliative, GI   Code Status:  FULL    DVT Prophylaxis:  SCDs     Procedures: As Listed in Progress Note Above   Antibiotics: Unaysn 7/28>>7/30 Cefazolin 7/30>>  Subjective: No chest pain, no nausea, no vomiting; feeling better and expressing significant ongoing bowel movements.  Patient also expressed intermittent leg cramps.   Objective: Vitals:   10/24/22 1950 10/25/22 0427 10/25/22 0500 10/25/22 1336  BP: 122/67 (!) 145/75  131/63  Pulse: 80 86  85  Resp: 20 20  (!) 21   Temp: 98.3 F (36.8 C) 98.7 F (37.1 C)  99.1 F (37.3 C)  TempSrc:    Oral  SpO2: 100% 98%  100%  Weight:   (!) 166 kg   Height:        Intake/Output Summary (Last 24 hours) at 10/25/2022 1710 Last data filed at 10/25/2022 1336 Gross per 24 hour  Intake 680 ml  Output 4750 ml  Net -4070 ml   Weight change: -1.1 kg  Exam: General exam: Alert, awake, oriented x 3; following commands and expressing feeling better.  Improve fluid overload status appreciated on exam.  No nausea, no vomiting, no chest pain. Respiratory system: No using accessory muscles; no wheezing or crackles on exam. Cardiovascular system:RRR. No rubs or gallops; unable to assess JVD with body habitus. Gastrointestinal system: Abdomen is obese, nondistended, soft and nontender. No organomegaly or masses felt. Normal bowel sounds heard. Central nervous system: Alert and oriented. No focal neurological deficits. Extremities: No cyanosis or clubbing; 1-2 + edema bilaterally. Skin: No petechiae. Psychiatry: Judgement and insight appear normal. Mood & affect appropriate.   Data Reviewed: I have personally reviewed following labs and imaging studies  Basic Metabolic Panel: Recent Labs  Lab 10/21/22 0459 10/22/22 0508 10/23/22 0419 10/24/22 0353 10/25/22 0353  NA 132* 131* 131* 133* 135  K 4.1 4.5 4.1 4.1 3.8  CL 94* 95* 96* 95* 95*  CO2 28 26 30 28 31   GLUCOSE 131* 150* 126* 115* 109*  BUN 22 25* 26* 24* 20  CREATININE 1.61* 1.55* 1.37* 1.31* 1.24  CALCIUM 8.4* 8.1* 8.0* 8.1* 8.4*  MG 1.9 1.9  --  2.1  --    Liver Function Tests: Recent Labs  Lab 10/20/22 0501 10/21/22 0459 10/22/22 0508 10/23/22 0419 10/24/22 0353  AST 42* 41 40 34 36  ALT 21 20 21 18 16   ALKPHOS 83 82 75 70 66  BILITOT 6.0* 9.0* 9.1* 6.0* 4.2*  PROT 6.3* 6.2* 5.9* 5.8* 6.1*  ALBUMIN 2.8* 2.9* 2.8* 2.7* 2.9*   Recent Labs  Lab 10/21/22 0459  LIPASE 43   Recent Labs  Lab 10/20/22 0841 10/21/22 0459 10/23/22 0419   AMMONIA 31 35 31   Coagulation Profile: Recent Labs  Lab 10/19/22 1323 10/22/22 1432 10/23/22 1159 10/24/22 0353  INR 1.6* 1.8* 1.6* 1.5*   CBC: Recent Labs  Lab 10/19/22 1323 10/19/22 1415 10/21/22 0459 10/22/22 0508 10/24/22 0556  WBC 5.8 5.4 8.0 9.0 5.9  NEUTROABS 4.2  --   --   --   --   HGB 10.1* 9.9* 9.2* 9.0* 8.5*  HCT 30.8* 30.9* 28.5* 28.5* 25.7*  MCV 91.1 91.7 92.2 92.8 91.8  PLT 76* 76* 61* 57* 61*   CBG: Recent Labs  Lab 10/19/22 2255 10/20/22 0748 10/20/22 1201 10/20/22 1709  GLUCAP 145* 126* 122* 106*   Urine analysis:    Component Value Date/Time   COLORURINE YELLOW 10/20/2022 1045   APPEARANCEUR CLEAR 10/20/2022 1045   LABSPEC 1.008 10/20/2022 1045   PHURINE 7.0 10/20/2022 1045   GLUCOSEU NEGATIVE 10/20/2022 1045   HGBUR NEGATIVE 10/20/2022 1045   BILIRUBINUR NEGATIVE 10/20/2022 1045   KETONESUR NEGATIVE 10/20/2022 1045   PROTEINUR NEGATIVE 10/20/2022 1045   NITRITE NEGATIVE 10/20/2022 1045   LEUKOCYTESUR NEGATIVE 10/20/2022 1045   Sepsis Labs: @LABRCNTIP (procalcitonin:4,lacticidven:4) ) Recent Results (from the past 240 hour(s))  Culture, blood (Routine X 2) w Reflex to ID Panel     Status: None   Collection Time: 10/20/22  9:13 AM   Specimen: Right Antecubital; Blood  Result Value Ref Range Status   Specimen Description   Final    RIGHT ANTECUBITAL BOTTLES DRAWN AEROBIC AND ANAEROBIC   Special Requests Blood Culture adequate volume  Final   Culture   Final    NO GROWTH 5 DAYS Performed at [facility], [address] [place], [place] [address]    Report Status 10/25/2022 FINAL  Final  Culture, blood (Routine X 2) w Reflex to ID Panel     Status: None   Collection Time: 10/20/22  9:13 AM   Specimen: BLOOD RIGHT HAND  Result Value Ref Range Status   Specimen Description   Final    BLOOD RIGHT HAND BOTTLES DRAWN AEROBIC AND ANAEROBIC   Special Requests Blood Culture adequate volume  Final   Culture   Final    NO GROWTH 5  DAYS Performed at [facility], [address] [place], [place] [address]    Report Status 10/25/2022 FINAL  Final  SARS Coronavirus 2 by RT PCR (hospital order, performed in [facility] hospital lab) *cepheid single result test* Anterior Nasal Swab     Status: None   Collection Time: 10/20/22  6:00 PM   Specimen: Anterior Nasal Swab  Result Value Ref Range Status   SARS Coronavirus 2 by RT PCR NEGATIVE NEGATIVE Final    Comment: (NOTE) SARS-CoV-2 target nucleic acids are NOT DETECTED.  The SARS-CoV-2 RNA is generally detectable in upper and lower respiratory specimens during the acute phase of infection. The lowest concentration of SARS-CoV-2 viral copies this assay can detect is 250 copies / mL. A negative result does not  preclude SARS-CoV-2 infection and should not be used as the sole basis for treatment or other patient management decisions.  A negative result [place] occur with improper specimen collection / handling, submission of specimen other than nasopharyngeal swab, presence of viral mutation(s) within the areas targeted by this assay, and inadequate number of viral copies (<250 copies / mL). A negative result must be combined with clinical observations, patient history, and epidemiological information.  Fact Sheet for Patients:   RoadLapTop.co.za  Fact Sheet for Healthcare Providers: http://kim-miller.com/  This test is not yet approved or  cleared by the [place] FDA and has been authorized for detection and/or diagnosis of SARS-CoV-2 by FDA under an Emergency Use Authorization (EUA).  This EUA will remain in effect (meaning this test can be used) for the duration of the COVID-19 declaration under Section 564(b)(1) of the Act, 21 U.S.C. section 360bbb-3(b)(1), unless the authorization is terminated or revoked sooner.  Performed at [facility], [address] [place], [place] [address]      Scheduled Meds:   enoxaparin (LOVENOX) injection  80 mg Subcutaneous Q24H   furosemide  40 mg Oral BID   lactose free nutrition  237 mL Oral QPM   lactulose  30 g Oral BID   levothyroxine  175 mcg Oral QAC breakfast   pantoprazole  40 mg Oral Daily   potassium chloride SA  40 mEq Oral BID   rifaximin  550 mg Oral BID   sertraline  100 mg Oral Daily   spironolactone  100 mg Oral Daily   spironolactone  50 mg Oral Daily   Continuous Infusions:  albumin human 50 g (10/25/22 1525)    ceFAZolin (ANCEF) IV 2 g (10/25/22 1444)    Procedures/Studies: US Venous Img Lower Unilateral Left (DVT)  Result Date: 10/24/2022 CLINICAL DATA:  Left lower extremity pain and edema. EXAM: LEFT LOWER EXTREMITY VENOUS DOPPLER ULTRASOUND TECHNIQUE: Gray-scale sonography with graded compression, as well as color Doppler and duplex ultrasound were performed to evaluate the lower extremity deep venous systems from the level of the common femoral vein and including the common femoral, femoral, profunda femoral, popliteal and calf veins including the posterior tibial, peroneal and gastrocnemius veins when visible. The superficial great saphenous vein was also interrogated. Spectral Doppler was utilized to evaluate flow at rest and with distal augmentation maneuvers in the common femoral, femoral and popliteal veins. COMPARISON:  Prior study at [facility] on 03/29/2021 FINDINGS: Contralateral Common Femoral Vein: Respiratory phasicity is normal and symmetric with the symptomatic side. No evidence of thrombus. Normal compressibility. Common Femoral Vein: No evidence of thrombus. Normal compressibility, respiratory phasicity and response to augmentation. Saphenofemoral Junction: No evidence of thrombus. Normal compressibility and flow on color Doppler imaging. Profunda Femoral Vein: No evidence of thrombus. Normal compressibility and flow on color Doppler imaging. Femoral Vein: No evidence of thrombus. Normal compressibility, respiratory  phasicity and response to augmentation. Popliteal Vein: No evidence of thrombus. Normal compressibility, respiratory phasicity and response to augmentation. Calf Veins: No evidence of thrombus. Normal compressibility and flow on color Doppler imaging. Superficial Great Saphenous Vein: No evidence of thrombus. Normal compressibility. Venous Reflux:  None. Other Findings: No evidence of superficial thrombophlebitis or abnormal fluid collection. IMPRESSION: No evidence of left lower extremity deep venous thrombosis. Electronically Signed   By: Irish Lack M.D.   On: 10/24/2022 10:42   CT HEAD WO CONTRAST ( )  Result Date: 10/21/2022 CLINICAL DATA:  Delirium, altered level of consciousness EXAM: CT HEAD WITHOUT CONTRAST TECHNIQUE:  Contiguous axial images were obtained from the base of the skull through the vertex without intravenous contrast. RADIATION DOSE REDUCTION: This exam was performed according to the departmental dose-optimization program which includes automated exposure control, adjustment of the mA and/or kV according to patient size and/or use of iterative reconstruction technique. COMPARISON:  08/22/2021 FINDINGS: Brain: No acute infarct or hemorrhage. Lateral ventricles and midline structures are stable. No acute extra-axial fluid collections. No mass effect. Vascular: No hyperdense vessel or unexpected calcification. Skull: Normal. Negative for fracture or focal lesion. Sinuses/Orbits: No acute finding. Other: None. IMPRESSION: 1. No acute intracranial process. Electronically Signed   By: Sharlet Salina M.D.   On: 10/21/2022 16:37   CT ABDOMEN PELVIS WO CONTRAST  Result Date: 10/20/2022 CLINICAL DATA:  Abdominal pain.  Known liver cirrhosis with ascites. EXAM: CT ABDOMEN AND PELVIS WITHOUT IV CONTRAST ( ORAL CONTRAST ONLY ) TECHNIQUE: Multidetector CT imaging of the abdomen and pelvis was performed following the standard protocol without IV contrast, following oral contrast only. RADIATION  DOSE REDUCTION: This exam was performed according to the departmental dose-optimization program which includes automated exposure control, adjustment of the mA and/or kV according to patient size and/or use of iterative reconstruction technique. COMPARISON:  CT with contrast 10/02/2022, CT without contrast 01/22/2022 FINDINGS: Lower chest: Small to moderate-sized right pleural effusion, increased. No left pleural effusion. There is compressive atelectasis in the right lower lobe adjacent the effusion but no lung base infiltrates. Breathing motion limits fine detail.  The cardiac size is normal. Hepatobiliary: The liver is cirrhotic and there is dilatation of the main portal vein which measures 1.9 cm. No mass is seen. Again surgically absent gallbladder without biliary dilatation. Pancreas: No focal abnormality in the unenhanced pancreas. There are generalized mesenteric congestive changes. Some of the edema is seen posterior and inferior to the pancreas and potentially could indicate evidence of pancreatitis, but this was also seen previously. Correlate with serum lipase for possible significance. Spleen: Enlarged measuring 22 cm length. There are splenorenal varices which were better seen with contrast. No focal parenchymal abnormality. Adrenals/Urinary Tract: There is no adrenal mass. There is a 3 mm nonobstructive caliceal stone in the inferior pole right kidney and occasional punctate nonobstructive caliceal stones on the left. There is no contour deforming abnormality of either kidney. No hydronephrosis or ureteral stone. Unremarkable bladder for the degree of distention. Stomach/Bowel: Somewhat thickened folds in the stomach, could be due to nondistention, gastritis or portal gastropathy. There are mildly dilated mid to lower abdominal small bowel loops up to 3 cm caliber, more normal caliber in distal small bowel with no visible transitional segment. Circumferential thickening is again seen in the ascending  colon. Rest of the large bowel wall unremarkable. This could be from colitis or portal colopathy. The appendix is normal caliber. Vascular/Lymphatic: Small varices in the omentum and abdominal wall. Unremarkable unenhanced aorta. Dilated portal vein as described above.  No lymphadenopathy is seen. Reproductive: No prostatomegaly. Other: There is mild patchy ascites in the mesenteric folds, mild upper abdominal and pelvic ascites but no drainable pocket. As above there is generalized mesenteric congestion. Moderate body wall anasarca continues to be seen there is a left inguinal hernia repair. There is a moderate-sized umbilical fat hernia containing scattered fluid in vessels. Musculoskeletal: There is osteopenia and degenerative change of the spine. Severe acquired foraminal stenosis L4-5 and L5-S1. Acquired spinal stenosis L4-5 and L5-S1. No acute or significant osseous findings. IMPRESSION: 1. Cirrhotic liver with dilated portal vein, splenomegaly, splenorenal  varices, and mild ascites. 2. Mesenteric congestion and body wall anasarca, similar to the last CT 10/02/2022. 3. Edema posterior and inferior to the pancreas which could be due to pancreatitis or part of the mesenteric congestive change, but this was also seen previously. Correlate with serum lipase. 4. Mildly dilated mid to lower abdominal small bowel loops with more normal caliber in distal small bowel. Findings could be due to ileus or partial obstruction. 5. Circumferential thickening of the ascending colon which could be due to colitis or portal colopathy. 6. Somewhat thickened gastric folds which could be due to nondistention, gastritis or portal gastropathy. 7. Small to moderate-sized right pleural effusion, increased. 8. Nonobstructive nephrolithiasis. 9. Moderate-sized umbilical fat hernia containing scattered fluid and vessels. 10. Osteopenia and degenerative change. Severe acquired foraminal and spinal canal stenosis L4-5 and L5-S1.  Electronically Signed   By: Almira Bar M.D.   On: 10/20/2022 22:46   DG Chest 2 View  Result Date: 10/19/2022 CLINICAL DATA:  Shortness of breath. EXAM: CHEST - 2 VIEW COMPARISON:  September 15, 2022. FINDINGS: The heart size and mediastinal contours are within normal limits. Both lungs are clear. The visualized skeletal structures are unremarkable. IMPRESSION: No active cardiopulmonary disease. Electronically Signed   By: Lupita Raider M.D.   On: 10/19/2022 15:08   [place] ASCITES (ABDOMEN LIMITED)  Result Date: 10/19/2022 CLINICAL DATA:  History of cirrhosis and ascites. Evaluation for possible paracentesis. EXAM: LIMITED ABDOMEN ULTRASOUND FOR ASCITES TECHNIQUE: Limited ultrasound survey for ascites was performed in all four abdominal quadrants. COMPARISON:  CT of the abdomen and pelvis on 10/02/2022 FINDINGS: Four quadrant survey of the peritoneal cavity demonstrates no significant ascites. Paracentesis was therefore not performed. IMPRESSION: No significant ascites. Paracentesis was not performed. Electronically Signed   By: Irish Lack M.D.   On: 10/19/2022 14:04   CT ABDOMEN PELVIS W CONTRAST  Result Date: 10/02/2022 CLINICAL DATA:  Left lower abdominal pain, nausea EXAM: CT ABDOMEN AND PELVIS WITH CONTRAST TECHNIQUE: Multidetector CT imaging of the abdomen and pelvis was performed using the standard protocol following bolus administration of intravenous contrast. RADIATION DOSE REDUCTION: This exam was performed according to the departmental dose-optimization program which includes automated exposure control, adjustment of the mA and/or kV according to patient size and/or use of iterative reconstruction technique. CONTRAST:  OMNIPAQUE IOHEXOL 300 MG/ML  SOLN COMPARISON:  01/22/2022 FINDINGS: Lower chest: Small right pleural effusion, new since previous. No pneumothorax. Visualized lung bases clear. Hepatobiliary: Nodular hepatic contour suggesting cirrhosis. No focal liver lesion or biliary  ductal dilatation. Cholecystectomy clips. Portal vein patent. Pancreas: No mass or ductal dilatation. Increase in retroperitoneal inflammatory/edematous changes predominately posterior and inferior to the pancreas. No pseudocyst. Spleen: Splenomegaly 21.4 cm craniocaudal length. No focal lesion. Relatively small splenorenal venous shunt. Adrenals/Urinary Tract: No adrenal mass. Symmetric renal parenchymal enhancement. 4 mm right lower pole renal calculus. No hydronephrosis. Urinary bladder is nondistended. Stomach/Bowel: Core small esophageal varices. The stomach is nondistended, without acute finding. Small bowel nondilated. Normal appendix. Colon incompletely distended, with suggestion of circumferential wall thickening in multiple segments, focal lesion evident. Vascular/Lymphatic: No AAA. Portal vein patent. Small esophageal varices. IVC patent. Reproductive: Prostate is unremarkable. Other: Small volume predominantly perihepatic ascites.  No free air. Musculoskeletal: Body wall anasarca most marked anteriorly. Moderate paraumbilical hernia containing only mesenteric fat and vessels, no bowel. Sutures of laparoscopic left inguinal hernia repair. Mild multilevel spondylitic changes in the lumbar spine. IMPRESSION: 1. Nonspecific retroperitoneal inflammatory/edematous changes predominately posterior and inferior to  the pancreas. Correlate with amylase and lipase to exclude pancreatitis. 2. Cirrhosis with splenomegaly, small volume ascites, and small esophageal varices. 3. Small right pleural effusion, new since previous. 4. Nonobstructive right nephrolithiasis. 5. Moderate paraumbilical hernia containing mesenteric fat and vessels, no bowel. Electronically Signed   By: Corlis Leak M.D.   On: 10/02/2022 16:51    Vassie Loll, MD [facility]  If 7PM-7AM, please contact night-coverage [email or web address] Password [email or web address] 10/25/2022, 5:10 PM   LOS: 6 days

## 2022-10-26 DIAGNOSIS — E871 Hypo-osmolality and hyponatremia: Secondary | ICD-10-CM | POA: Diagnosis not present

## 2022-10-26 DIAGNOSIS — K7682 Hepatic encephalopathy: Secondary | ICD-10-CM | POA: Diagnosis not present

## 2022-10-26 DIAGNOSIS — I10 Essential (primary) hypertension: Secondary | ICD-10-CM | POA: Diagnosis not present

## 2022-10-26 DIAGNOSIS — R601 Generalized edema: Secondary | ICD-10-CM | POA: Diagnosis not present

## 2022-10-26 DIAGNOSIS — Z6841 Body Mass Index (BMI) 40.0 and over, adult: Secondary | ICD-10-CM | POA: Diagnosis not present

## 2022-10-26 DIAGNOSIS — G4733 Obstructive sleep apnea (adult) (pediatric): Secondary | ICD-10-CM | POA: Diagnosis not present

## 2022-10-26 DIAGNOSIS — L03116 Cellulitis of left lower limb: Secondary | ICD-10-CM | POA: Diagnosis not present

## 2022-10-26 DIAGNOSIS — L02416 Cutaneous abscess of left lower limb: Secondary | ICD-10-CM | POA: Diagnosis not present

## 2022-10-26 DIAGNOSIS — E119 Type 2 diabetes mellitus without complications: Secondary | ICD-10-CM | POA: Diagnosis not present

## 2022-10-26 DIAGNOSIS — K7581 Nonalcoholic steatohepatitis (NASH): Secondary | ICD-10-CM | POA: Diagnosis not present

## 2022-10-26 MED ORDER — SPIRONOLACTONE 100 MG PO TABS: 150.0000 mg | ORAL_TABLET | Freq: Every day | ORAL | 2 refills | Status: DC

## 2022-10-26 MED ORDER — CEPHALEXIN 500 MG PO CAPS: 500.0000 mg | ORAL_CAPSULE | Freq: Two times a day (BID) | ORAL | 0 refills | Status: AC

## 2022-10-26 MED ORDER — CARVEDILOL 3.125 MG PO TABS: 3.1250 mg | ORAL_TABLET | Freq: Two times a day (BID) | ORAL | 1 refills | Status: DC

## 2022-10-26 MED ORDER — FUROSEMIDE 40 MG PO TABS: 80.0000 mg | ORAL_TABLET | Freq: Two times a day (BID) | ORAL | 1 refills | Status: DC

## 2022-10-26 MED ORDER — LACTULOSE 10 GM/15ML PO SOLN: 20.0000 g | Freq: Two times a day (BID) | ORAL | 2 refills | Status: DC

## 2022-10-26 NOTE — Plan of Care (Signed)
Problem: Education: Goal: Ability to demonstrate management of disease process will improve 10/26/2022 1330 by Collene Gobble, RN Outcome: Adequate for Discharge 10/26/2022 1330 by Collene Gobble, RN Outcome: Progressing Goal: Ability to verbalize understanding of medication therapies will improve 10/26/2022 1330 by Collene Gobble, RN Outcome: Adequate for Discharge 10/26/2022 1330 by Collene Gobble, RN Outcome: Progressing Goal: Individualized Educational Video(s) 10/26/2022 1330 by Collene Gobble, RN Outcome: Adequate for Discharge 10/26/2022 1330 by Collene Gobble, RN Outcome: Progressing   Problem: Activity: Goal: Capacity to carry out activities will improve 10/26/2022 1330 by Collene Gobble, RN Outcome: Adequate for Discharge 10/26/2022 1330 by Collene Gobble, RN Outcome: Progressing   Problem: Cardiac: Goal: Ability to achieve and maintain adequate cardiopulmonary perfusion will improve 10/26/2022 1330 by Collene Gobble, RN Outcome: Adequate for Discharge 10/26/2022 1330 by Collene Gobble, RN Outcome: Progressing   Problem: Education: Goal: Knowledge of General Education information will improve Description: Including pain rating scale, medication(s)/side effects and non-pharmacologic comfort measures 10/26/2022 1330 by Collene Gobble, RN Outcome: Adequate for Discharge 10/26/2022 1330 by Collene Gobble, RN Outcome: Progressing   Problem: Health Behavior/Discharge Planning: Goal: Ability to manage health-related needs will improve 10/26/2022 1330 by Collene Gobble, RN Outcome: Adequate for Discharge 10/26/2022 1330 by Collene Gobble, RN Outcome: Progressing   Problem: Clinical Measurements: Goal: Ability to maintain clinical measurements within normal limits will improve 10/26/2022 1330 by Collene Gobble, RN Outcome: Adequate for Discharge 10/26/2022 1330 by Collene Gobble, RN Outcome: Progressing Goal: Will remain free from infection 10/26/2022 1330 by Collene Gobble,  RN Outcome: Adequate for Discharge 10/26/2022 1330 by Collene Gobble, RN Outcome: Progressing Goal: Diagnostic test results will improve 10/26/2022 1330 by Collene Gobble, RN Outcome: Adequate for Discharge 10/26/2022 1330 by Collene Gobble, RN Outcome: Progressing Goal: Respiratory complications will improve 10/26/2022 1330 by Collene Gobble, RN Outcome: Adequate for Discharge 10/26/2022 1330 by Collene Gobble, RN Outcome: Progressing Goal: Cardiovascular complication will be avoided 10/26/2022 1330 by Collene Gobble, RN Outcome: Adequate for Discharge 10/26/2022 1330 by Collene Gobble, RN Outcome: Progressing   Problem: Activity: Goal: Risk for activity intolerance will decrease 10/26/2022 1330 by Collene Gobble, RN Outcome: Adequate for Discharge 10/26/2022 1330 by Collene Gobble, RN Outcome: Progressing   Problem: Nutrition: Goal: Adequate nutrition will be maintained 10/26/2022 1330 by Collene Gobble, RN Outcome: Adequate for Discharge 10/26/2022 1330 by Collene Gobble, RN Outcome: Progressing   Problem: Coping: Goal: Level of anxiety will decrease 10/26/2022 1330 by Collene Gobble, RN Outcome: Adequate for Discharge 10/26/2022 1330 by Collene Gobble, RN Outcome: Progressing   Problem: Elimination: Goal: Will not experience complications related to bowel motility 10/26/2022 1330 by Collene Gobble, RN Outcome: Adequate for Discharge 10/26/2022 1330 by Collene Gobble, RN Outcome: Progressing Goal: Will not experience complications related to urinary retention 10/26/2022 1330 by Collene Gobble, RN Outcome: Adequate for Discharge 10/26/2022 1330 by Collene Gobble, RN Outcome: Progressing   Problem: Pain Managment: Goal: General experience of comfort will improve 10/26/2022 1330 by Collene Gobble, RN Outcome: Adequate for Discharge 10/26/2022 1330 by Collene Gobble, RN Outcome: Progressing   Problem: Safety: Goal: Ability to remain free from injury will improve 10/26/2022  1330 by Collene Gobble, RN Outcome: Adequate for Discharge 10/26/2022 1330 by Collene Gobble, RN Outcome: Progressing   Problem: Skin Integrity: Goal: Risk for impaired skin integrity  will decrease 10/26/2022 1330 by Collene Gobble, RN Outcome: Adequate for Discharge 10/26/2022 1330 by Collene Gobble, RN Outcome: Progressing

## 2022-10-26 NOTE — Discharge Summary (Signed)
Physician Discharge Summary   Patient: Daniel Crawford MRN: [id number] DOB: [birth year]  Admit date:     10/19/2022  Discharge date: 10/26/22  Discharge Physician: Vassie Loll   PCP: [facility]   Recommendations at discharge:  Repeat basic metabolic panel to follow electrolytes and renal function Repeat CBC to follow hemoglobin trend/stability Make sure patient follow-up with gastroenterology service as instructed. Recommending outpatient follow-up for thyroid panel with further adjustment to Synthroid dosage as required.  Discharge Diagnoses: Principal Problem:   Anasarca Active Problems:   Cirrhosis of liver with ascites (HCC)   Chronic diastolic heart failure (HCC)   Essential hypertension   Morbid obesity (HCC)   T2DM (type 2 diabetes mellitus) (HCC)   CKD stage 3a, GFR 45-59 ml/min (HCC)   OSA (obstructive sleep apnea)   Acquired hypothyroidism   Decompensation of cirrhosis of liver (HCC)   Acute exacerbation of congestive heart failure (HCC)   NASH (nonalcoholic steatohepatitis)   Class 3 severe obesity in adult [facility])   Acute hepatic encephalopathy (HCC)   Chronic hyponatremia   Cellulitis and abscess of left leg   Hospital Course: 67 year old male with a history of diabetes mellitus type 2, hypertension, NASH liver cirrhosis, hypothyroidism, CKD stage III, pancytopenia, depression/anxiety, OSA presenting with shortness of breath and orthopnea type symptoms.  The patient was at [facility] for evaluation of paracentesis.  There was not enough ascites for paracentesis.  However, he had stated that he was having more shortness of breath and dyspnea on exertion and orthopnea.  As result, the patient was taken the emergency department for further evaluation and treatment.  He endorses compliance with all his medications at home.  However, he notes increasing edema and abdominal swelling. Notably, the patient was recently mated to the hospital from  10/02/2022 to 10/05/2022 for fluid overload.  He was discharged home with torsemide 60 mg twice daily.  He denies any fevers, chills, chest pain, nausea, vomiting, diarrhea.  There is no hematochezia or melena. In the ED, the patient had low-grade temperature 100.4 F.  Oxygen saturation was 97-100% room air.  WBC 5.8, hemoglobin 10.1, platelets 76,000.  Sodium 135, potassium 3.7, bicarbonate 30, serum creatinine 1.56.  AST 42, ALT 21, alk phosphatase 83, total bilirubin 6.0.  Chest x-ray was negative for any acute findings.  Patient was started on IV furosemide.  In the morning of 10/20/2022, the patient was somewhat slow to respond to questions.  As result, there was concern for hepatic encephalopathy.  Ammonia level was checked.  His lactulose dose was increased to q 6hrs  The patient developed low-grade fever up to 101.1 F.  In addition, the patient did not have any bowel movements with increased dose of lactulose.  CT of the abdomen and pelvis was obtained and showed mild ascites.  There is mesenteric congestion and body wall anasarca similar to 10/02/2022.  There was mildly dilated lower abdominal small bowel loops.  There was circumferential thickening of the ascending colon felt to be a portal colopathy versus colitis.  The patient continued to remain confused. Finally on 10/22/22 patient began having BMs with improvement of his mentation.  Assessment and Plan: Decompensated liver cirrhosis with anasarca -remains clinically fluid overloaded -many inconsistencies regarding how he takes torsemide at home -suspect a degree of dietary/fluid indiscretion contributing to his frequent decompensations -Continue low-sodium diet -Patient is not felt to be a TIPS or transplant candidate secondary to comorbidities -continue rifaximin and the use of lactulose -previously evaluated by  [facility] for liver transplant, though not a candidate due to his BMI, multi-morbidities.   -Looking to transition diuretics to oral  route and follow-up as an outpatient with gastroenterology service. -Discharge planning to use 80 mg of Lasix twice a day along with 150 mg of spironolactone. -Continue to follow daily weights and adequate hydration. -Patient approximately 50 pounds lighter from fluid removal at discharge.   Acute metabolic encephalopathy/hepatic encephalopathy -Ammonia level at time of discharge 35 -Continue lactulose 20 g twice a day 8 while achieving 2-3 bowel movements daily. -Continue the use of rifaximin. -UA--neg for pyuria -check COVID--neg -d/c oxycodone -CT brain--neg -VBG--7.48/47/42/34 -B12--1139 -folate 15.1 -TSH 8.858, Free T4 0.99>>euthyroid sick; with plan to repeat thyroid panel as an outpatient. -Mentation back to baseline.   Ileus -initially no BMs with increased dose of lactulose -no BM with lactulose first 48 hour>>>now 5 BMs in last 24 hours -10/20/22 CT abd--mesenteric congestion and body wall anasarca similar to 10/02/2022.  There was mildly dilated lower abdominal small bowel loops.  There was circumferential thickening of the ascending colon felt to be a portal colopathy versus colitis.  -improved/resolved. -Adjusting lactulose in order to achieve 2-3 bowel movements daily -Patient mentation within normal limits.   Cellulitis left leg -No open wounds or active drainage appreciated. -venous duplex not demonstrating acute DVT. -Continue oral Keflex at discharge to complete antibiotic therapy.   Portal HTN colopathy -cannot rule out colitis on CT -GI doubts colitis -Continue supportive care and follow with GI service as an outpatient.   CKD 3a -baseline creatinine 1.2-1.6 -Renal function has remained stable; continue to follow creatinine trend with the use of diuretics.   Pancytopenia -due to liver cirrhosis -No overt bleeding appreciated -Continue to follow blood counts   Diabetes mellitus type 2 -not on any agents at home -check A1C--5.1 -Continue modified  carbohydrate diet.   Essential hypertension -Resume home antihypertensive agents -Close monitoring of patient blood pressure with further adjustment to regimen as required.   Hypothyroidism -Continue Synthroid -Outpatient thyroid panel follow-up recommended.   Depression/anxiety -Continue Zoloft/hydroxyzine -Stable mood.   Morbid Obesity -BMI 52.17 -Low-calorie diet and portion control discussed with patient.   Legs cramping -As needed Requip will be provided -Continue to closely follow electrolytes while actively diuresing and replete as needed -Follow clinical response.   Chronic congestive heart failure (diastolic) -Fluid overload/anasarca driven by NASH cirrhosis -No requiring oxygen supplementation and denying orthopnea at discharge. -Continue diuretic therapy as per GI recommendations. -Low-sodium diet and daily weights discussed with patient.  Consultants: Gastroenterology service Procedures performed: See below for x-ray reports Disposition: Home with home health services Diet recommendation: Low-sodium/low calorie diet.  DISCHARGE MEDICATION: Allergies as of 10/26/2022   No Known Allergies      Medication List     STOP taking these medications    torsemide 20 MG tablet Commonly known as: DEMADEX       TAKE these medications    acetaminophen 500 MG tablet Commonly known as: TYLENOL Take 1,000 mg by mouth every 6 (six) hours as needed for moderate pain.   albuterol 108 (90 Base) MCG/ACT inhaler Commonly known as: VENTOLIN HFA Inhale 2 puffs into the lungs every 6 (six) hours as needed for wheezing or shortness of breath.   carvedilol 3.125 MG tablet Commonly known as: COREG Take 1 tablet (3.125 mg total) by mouth 2 (two) times daily with a meal. What changed:  medication strength how much to take   cephALEXin 500 MG capsule Commonly known as:  KEFLEX Take 1 capsule (500 mg total) by mouth 2 (two) times daily for 5 days.   furosemide 40 MG  tablet Commonly known as: LASIX Take 2 tablets (80 mg total) by mouth 2 (two) times daily.   gabapentin 100 MG capsule Commonly known as: NEURONTIN Take 100 mg by mouth daily as needed (pain).   hydrOXYzine 50 MG capsule Commonly known as: VISTARIL Take 1 capsule (50 mg total) by mouth 3 (three) times daily as needed for anxiety or nausea.   lactose free nutrition Liqd Take 237 mLs by mouth every evening.   lactulose 10 GM/15ML solution Commonly known as: CHRONULAC Take 30 mLs (20 g total) by mouth 2 (two) times daily.   levothyroxine 175 MCG tablet Commonly known as: SYNTHROID Take 175 mcg by mouth daily before breakfast.   ondansetron 8 MG tablet Commonly known as: ZOFRAN Take by mouth every 8 (eight) hours as needed for nausea or vomiting.   pantoprazole 40 MG tablet Commonly known as: PROTONIX Take 1 tablet (40 mg total) by mouth daily.   potassium chloride SA 20 MEQ tablet Commonly known as: KLOR-CON M Take 2 tablets (40 mEq total) by mouth 2 (two) times daily.   rifaximin 550 MG Tabs tablet Commonly known as: XIFAXAN Take 1 tablet (550 mg total) by mouth 2 (two) times daily.   sertraline 100 MG tablet Commonly known as: ZOLOFT Take 100 mg by mouth daily.   spironolactone 100 MG tablet Commonly known as: ALDACTONE Take 1.5 tablets (150 mg total) by mouth daily. What changed: how much to take   traZODone 50 MG tablet Commonly known as: DESYREL Take 1 tablet (50 mg total) by mouth at bedtime.        Follow-up Information     [facility] Schedule an appointment as soon as possible for a visit in 10 day(s).   Contact information: [address] [place] [address] [phone]                Discharge Exam: Ceasar Mons Weights   10/24/22 1250 10/25/22 0500 10/26/22 0500  Weight: (!) 180.8 kg (!) 166 kg (!) 173.4 kg   General exam: Alert, awake, oriented x 3; following commands and expressing feeling much better and  wanting to go home. Respiratory system: No using accessory muscles; no wheezing or crackles on exam.  Good saturation on room air. Cardiovascular system:RRR. No rubs or gallops; unable to assess JVD with body habitus. Gastrointestinal system: Abdomen is obese, nondistended, soft and nontender. No organomegaly or masses felt. Normal bowel sounds heard. Central nervous system: Alert and oriented. No focal neurological deficits. Extremities: No cyanosis or clubbing; 1-2 + edema bilaterally. Skin: No petechiae. Psychiatry: Judgement and insight appear normal. Mood & affect appropriate.   Condition at discharge: Stable and improved.  The results of significant diagnostics from this hospitalization (including imaging, microbiology, ancillary and laboratory) are listed below for reference.   Imaging Studies: US Venous Img Lower Unilateral Left (DVT)  Result Date: 10/24/2022 CLINICAL DATA:  Left lower extremity pain and edema. EXAM: LEFT LOWER EXTREMITY VENOUS DOPPLER ULTRASOUND TECHNIQUE: Gray-scale sonography with graded compression, as well as color Doppler and duplex ultrasound were performed to evaluate the lower extremity deep venous systems from the level of the common femoral vein and including the common femoral, femoral, profunda femoral, popliteal and calf veins including the posterior tibial, peroneal and gastrocnemius veins when visible. The superficial great saphenous vein was also interrogated. Spectral Doppler was utilized to evaluate  flow at rest and with distal augmentation maneuvers in the common femoral, femoral and popliteal veins. COMPARISON:  Prior study at [facility] on 03/29/2021 FINDINGS: Contralateral Common Femoral Vein: Respiratory phasicity is normal and symmetric with the symptomatic side. No evidence of thrombus. Normal compressibility. Common Femoral Vein: No evidence of thrombus. Normal compressibility, respiratory phasicity and response to augmentation. Saphenofemoral  Junction: No evidence of thrombus. Normal compressibility and flow on color Doppler imaging. Profunda Femoral Vein: No evidence of thrombus. Normal compressibility and flow on color Doppler imaging. Femoral Vein: No evidence of thrombus. Normal compressibility, respiratory phasicity and response to augmentation. Popliteal Vein: No evidence of thrombus. Normal compressibility, respiratory phasicity and response to augmentation. Calf Veins: No evidence of thrombus. Normal compressibility and flow on color Doppler imaging. Superficial Great Saphenous Vein: No evidence of thrombus. Normal compressibility. Venous Reflux:  None. Other Findings: No evidence of superficial thrombophlebitis or abnormal fluid collection. IMPRESSION: No evidence of left lower extremity deep venous thrombosis. Electronically Signed   By: Irish Lack M.D.   On: 10/24/2022 10:42   CT HEAD WO CONTRAST ( )  Result Date: 10/21/2022 CLINICAL DATA:  Delirium, altered level of consciousness EXAM: CT HEAD WITHOUT CONTRAST TECHNIQUE: Contiguous axial images were obtained from the base of the skull through the vertex without intravenous contrast. RADIATION DOSE REDUCTION: This exam was performed according to the departmental dose-optimization program which includes automated exposure control, adjustment of the mA and/or kV according to patient size and/or use of iterative reconstruction technique. COMPARISON:  08/22/2021 FINDINGS: Brain: No acute infarct or hemorrhage. Lateral ventricles and midline structures are stable. No acute extra-axial fluid collections. No mass effect. Vascular: No hyperdense vessel or unexpected calcification. Skull: Normal. Negative for fracture or focal lesion. Sinuses/Orbits: No acute finding. Other: None. IMPRESSION: 1. No acute intracranial process. Electronically Signed   By: Sharlet Salina M.D.   On: 10/21/2022 16:37   CT ABDOMEN PELVIS WO CONTRAST  Result Date: 10/20/2022 CLINICAL DATA:  Abdominal pain.   Known liver cirrhosis with ascites. EXAM: CT ABDOMEN AND PELVIS WITHOUT IV CONTRAST ( ORAL CONTRAST ONLY ) TECHNIQUE: Multidetector CT imaging of the abdomen and pelvis was performed following the standard protocol without IV contrast, following oral contrast only. RADIATION DOSE REDUCTION: This exam was performed according to the departmental dose-optimization program which includes automated exposure control, adjustment of the mA and/or kV according to patient size and/or use of iterative reconstruction technique. COMPARISON:  CT with contrast 10/02/2022, CT without contrast 01/22/2022 FINDINGS: Lower chest: Small to moderate-sized right pleural effusion, increased. No left pleural effusion. There is compressive atelectasis in the right lower lobe adjacent the effusion but no lung base infiltrates. Breathing motion limits fine detail.  The cardiac size is normal. Hepatobiliary: The liver is cirrhotic and there is dilatation of the main portal vein which measures 1.9 cm. No mass is seen. Again surgically absent gallbladder without biliary dilatation. Pancreas: No focal abnormality in the unenhanced pancreas. There are generalized mesenteric congestive changes. Some of the edema is seen posterior and inferior to the pancreas and potentially could indicate evidence of pancreatitis, but this was also seen previously. Correlate with serum lipase for possible significance. Spleen: Enlarged measuring 22 cm length. There are splenorenal varices which were better seen with contrast. No focal parenchymal abnormality. Adrenals/Urinary Tract: There is no adrenal mass. There is a 3 mm nonobstructive caliceal stone in the inferior pole right kidney and occasional punctate nonobstructive caliceal stones on the left. There is no contour deforming abnormality of  either kidney. No hydronephrosis or ureteral stone. Unremarkable bladder for the degree of distention. Stomach/Bowel: Somewhat thickened folds in the stomach, could be due  to nondistention, gastritis or portal gastropathy. There are mildly dilated mid to lower abdominal small bowel loops up to 3 cm caliber, more normal caliber in distal small bowel with no visible transitional segment. Circumferential thickening is again seen in the ascending colon. Rest of the large bowel wall unremarkable. This could be from colitis or portal colopathy. The appendix is normal caliber. Vascular/Lymphatic: Small varices in the omentum and abdominal wall. Unremarkable unenhanced aorta. Dilated portal vein as described above.  No lymphadenopathy is seen. Reproductive: No prostatomegaly. Other: There is mild patchy ascites in the mesenteric folds, mild upper abdominal and pelvic ascites but no drainable pocket. As above there is generalized mesenteric congestion. Moderate body wall anasarca continues to be seen there is a left inguinal hernia repair. There is a moderate-sized umbilical fat hernia containing scattered fluid in vessels. Musculoskeletal: There is osteopenia and degenerative change of the spine. Severe acquired foraminal stenosis L4-5 and L5-S1. Acquired spinal stenosis L4-5 and L5-S1. No acute or significant osseous findings. IMPRESSION: 1. Cirrhotic liver with dilated portal vein, splenomegaly, splenorenal varices, and mild ascites. 2. Mesenteric congestion and body wall anasarca, similar to the last CT 10/02/2022. 3. Edema posterior and inferior to the pancreas which could be due to pancreatitis or part of the mesenteric congestive change, but this was also seen previously. Correlate with serum lipase. 4. Mildly dilated mid to lower abdominal small bowel loops with more normal caliber in distal small bowel. Findings could be due to ileus or partial obstruction. 5. Circumferential thickening of the ascending colon which could be due to colitis or portal colopathy. 6. Somewhat thickened gastric folds which could be due to nondistention, gastritis or portal gastropathy. 7. Small to  moderate-sized right pleural effusion, increased. 8. Nonobstructive nephrolithiasis. 9. Moderate-sized umbilical fat hernia containing scattered fluid and vessels. 10. Osteopenia and degenerative change. Severe acquired foraminal and spinal canal stenosis L4-5 and L5-S1. Electronically Signed   By: Almira Bar M.D.   On: 10/20/2022 22:46   DG Chest 2 View  Result Date: 10/19/2022 CLINICAL DATA:  Shortness of breath. EXAM: CHEST - 2 VIEW COMPARISON:  September 15, 2022. FINDINGS: The heart size and mediastinal contours are within normal limits. Both lungs are clear. The visualized skeletal structures are unremarkable. IMPRESSION: No active cardiopulmonary disease. Electronically Signed   By: Lupita Raider M.D.   On: 10/19/2022 15:08   [place] ASCITES (ABDOMEN LIMITED)  Result Date: 10/19/2022 CLINICAL DATA:  History of cirrhosis and ascites. Evaluation for possible paracentesis. EXAM: LIMITED ABDOMEN ULTRASOUND FOR ASCITES TECHNIQUE: Limited ultrasound survey for ascites was performed in all four abdominal quadrants. COMPARISON:  CT of the abdomen and pelvis on 10/02/2022 FINDINGS: Four quadrant survey of the peritoneal cavity demonstrates no significant ascites. Paracentesis was therefore not performed. IMPRESSION: No significant ascites. Paracentesis was not performed. Electronically Signed   By: Irish Lack M.D.   On: 10/19/2022 14:04   CT ABDOMEN PELVIS W CONTRAST  Result Date: 10/02/2022 CLINICAL DATA:  Left lower abdominal pain, nausea EXAM: CT ABDOMEN AND PELVIS WITH CONTRAST TECHNIQUE: Multidetector CT imaging of the abdomen and pelvis was performed using the standard protocol following bolus administration of intravenous contrast. RADIATION DOSE REDUCTION: This exam was performed according to the departmental dose-optimization program which includes automated exposure control, adjustment of the mA and/or kV according to patient size and/or use of iterative  reconstruction technique. CONTRAST:   OMNIPAQUE IOHEXOL 300 MG/ML  SOLN COMPARISON:  01/22/2022 FINDINGS: Lower chest: Small right pleural effusion, new since previous. No pneumothorax. Visualized lung bases clear. Hepatobiliary: Nodular hepatic contour suggesting cirrhosis. No focal liver lesion or biliary ductal dilatation. Cholecystectomy clips. Portal vein patent. Pancreas: No mass or ductal dilatation. Increase in retroperitoneal inflammatory/edematous changes predominately posterior and inferior to the pancreas. No pseudocyst. Spleen: Splenomegaly 21.4 cm craniocaudal length. No focal lesion. Relatively small splenorenal venous shunt. Adrenals/Urinary Tract: No adrenal mass. Symmetric renal parenchymal enhancement. 4 mm right lower pole renal calculus. No hydronephrosis. Urinary bladder is nondistended. Stomach/Bowel: Core small esophageal varices. The stomach is nondistended, without acute finding. Small bowel nondilated. Normal appendix. Colon incompletely distended, with suggestion of circumferential wall thickening in multiple segments, focal lesion evident. Vascular/Lymphatic: No AAA. Portal vein patent. Small esophageal varices. IVC patent. Reproductive: Prostate is unremarkable. Other: Small volume predominantly perihepatic ascites.  No free air. Musculoskeletal: Body wall anasarca most marked anteriorly. Moderate paraumbilical hernia containing only mesenteric fat and vessels, no bowel. Sutures of laparoscopic left inguinal hernia repair. Mild multilevel spondylitic changes in the lumbar spine. IMPRESSION: 1. Nonspecific retroperitoneal inflammatory/edematous changes predominately posterior and inferior to the pancreas. Correlate with amylase and lipase to exclude pancreatitis. 2. Cirrhosis with splenomegaly, small volume ascites, and small esophageal varices. 3. Small right pleural effusion, new since previous. 4. Nonobstructive right nephrolithiasis. 5. Moderate paraumbilical hernia containing mesenteric fat and vessels, no bowel.  Electronically Signed   By: Corlis Leak M.D.   On: 10/02/2022 16:51    Microbiology: Results for orders placed or performed during the hospital encounter of 10/19/22  Culture, blood (Routine X 2) w Reflex to ID Panel     Status: None   Collection Time: 10/20/22  9:13 AM   Specimen: Right Antecubital; Blood  Result Value Ref Range Status   Specimen Description   Final    RIGHT ANTECUBITAL BOTTLES DRAWN AEROBIC AND ANAEROBIC   Special Requests Blood Culture adequate volume  Final   Culture   Final    NO GROWTH 5 DAYS Performed at [facility], [address], [place], [place] [address]    Report Status 10/25/2022 FINAL  Final  Culture, blood (Routine X 2) w Reflex to ID Panel     Status: None   Collection Time: 10/20/22  9:13 AM   Specimen: BLOOD RIGHT HAND  Result Value Ref Range Status   Specimen Description   Final    BLOOD RIGHT HAND BOTTLES DRAWN AEROBIC AND ANAEROBIC   Special Requests Blood Culture adequate volume  Final   Culture   Final    NO GROWTH 5 DAYS Performed at [facility], [address] [place], [place] [address]    Report Status 10/25/2022 FINAL  Final  SARS Coronavirus 2 by RT PCR (hospital order, performed in [facility] hospital lab) *cepheid single result test* Anterior Nasal Swab     Status: None   Collection Time: 10/20/22  6:00 PM   Specimen: Anterior Nasal Swab  Result Value Ref Range Status   SARS Coronavirus 2 by RT PCR NEGATIVE NEGATIVE Final    Comment: (NOTE) SARS-CoV-2 target nucleic acids are NOT DETECTED.  The SARS-CoV-2 RNA is generally detectable in upper and lower respiratory specimens during the acute phase of infection. The lowest concentration of SARS-CoV-2 viral copies this assay can detect is 250 copies / mL. A negative result does not preclude SARS-CoV-2 infection and should not be used as the sole basis  for treatment or other patient management decisions.  A negative result Bratz occur with improper specimen collection / handling,  submission of specimen other than nasopharyngeal swab, presence of viral mutation(s) within the areas targeted by this assay, and inadequate number of viral copies (<250 copies / mL). A negative result must be combined with clinical observations, patient history, and epidemiological information.  Fact Sheet for Patients:   RoadLapTop.co.za  Fact Sheet for Healthcare Providers: http://kim-miller.com/  This test is not yet approved or  cleared by the [place] FDA and has been authorized for detection and/or diagnosis of SARS-CoV-2 by FDA under an Emergency Use Authorization (EUA).  This EUA will remain in effect (meaning this test can be used) for the duration of the COVID-19 declaration under Section 564(b)(1) of the Act, 21 U.S.C. section 360bbb-3(b)(1), unless the authorization is terminated or revoked sooner.  Performed at [facility], [address], [place], [place] [address]     Labs: CBC: Recent Labs  Lab 10/21/22 0459 10/22/22 0508 10/24/22 0556  WBC 8.0 9.0 5.9  HGB 9.2* 9.0* 8.5*  HCT 28.5* 28.5* 25.7*  MCV 92.2 92.8 91.8  PLT 61* 57* 61*   Basic Metabolic Panel: Recent Labs  Lab 10/21/22 0459 10/22/22 0508 10/23/22 0419 10/24/22 0353 10/25/22 0353 10/26/22 0416  NA 132* 131* 131* 133* 135 136  K 4.1 4.5 4.1 4.1 3.8 3.9  CL 94* 95* 96* 95* 95* 97*  CO2 28 26 30 28 31  32  GLUCOSE 131* 150* 126* 115* 109* 117*  BUN 22 25* 26* 24* 20 14  CREATININE 1.61* 1.55* 1.37* 1.31* 1.24 1.13  CALCIUM 8.4* 8.1* 8.0* 8.1* 8.4* 8.7*  MG 1.9 1.9  --  2.1  --   --    Liver Function Tests: Recent Labs  Lab 10/21/22 0459 10/22/22 0508 10/23/22 0419 10/24/22 0353 10/26/22 0416  AST 41 40 34 36 32  ALT 20 21 18 16 12   ALKPHOS 82 75 70 66 64  BILITOT 9.0* 9.1* 6.0* 4.2* 3.3*  PROT 6.2* 5.9* 5.8* 6.1* 6.7  ALBUMIN 2.9* 2.8* 2.7* 2.9* 4.0   CBG: Recent Labs  Lab 10/19/22 2255 10/20/22 0748 10/20/22 1201  10/20/22 1709  GLUCAP 145* 126* 122* 106*    Discharge time spent: greater than 30 minutes.  Signed: Vassie Loll, MD [facility] 10/26/2022

## 2022-10-26 NOTE — Progress Notes (Signed)
   10/26/22 1535  Spiritual Encounters  Type of Visit Initial  Care provided to: Patient  Conversation partners present during encounter Nurse  Referral source Clinical staff  Reason for visit Routine spiritual support  OnCall Visit No   Chaplain went to visit Pt, responding to a request. Pt shared he was feeling much better and looking forward to leave the hospital to meet with his brother and sister, coming to visit him from [place]. Pt feels a strong support in his faith and trusts in his beliefs. Pt feels God sustains him in this year which referred to be a "tough year", since he recounts many times coming into the hospital during the past year and this year. Pt also mourns the death of his beloved dog "shallo", who passed last April. Pt looks forward to having the opportunity to see his relatives coming from [place] to be with him. Pt hasn't seen them since five years ago. Pt was grateful for Chaplain's visit and the opportunity to share about his expectations and hopes for the future.

## 2022-10-26 NOTE — Progress Notes (Signed)
Gastroenterology & Hepatology   Interval History:   Patient is seen bedside.  Reports his breathing is much better.  Says he feels less swollen with lower extremity swelling going down.  He is anxious to go home  Inpatient Medications:  Current Facility-Administered Medications:    albumin human 25 % solution 50 g, 50 g, Intravenous, Q8H, Marguerita Merles, Reuel Boom, MD, Last Rate: 60 mL/hr at 10/26/22 0511, 50 g at 10/26/22 0511   albuterol (PROVENTIL) (2.5 MG/3ML) 0.083% nebulizer solution 3 mL, 3 mL, Inhalation, Q6H PRN, Stinson, Jacob J, DO   camphor-menthol (SARNA) lotion, , Topical, PRN, Catarina Hartshorn, MD   ceFAZolin (ANCEF) IVPB 2g/100 mL premix, 2 g, Intravenous, Q8H, Tat, Onalee Hua, MD, Last Rate: 200 mL/hr at 10/26/22 0537, 2 g at 10/26/22 0537   diazepam (VALIUM) injection 2.5 mg, 2.5 mg, Intravenous, Q6H PRN, Tat, Onalee Hua, MD, 2.5 mg at 10/24/22 2128   enoxaparin (LOVENOX) injection 80 mg, 80 mg, Subcutaneous, Q24H, Earnie Larsson, RPH, 80 mg at 10/25/22 2254   furosemide (LASIX) tablet 40 mg, 40 mg, Oral, BID, Tiffany Kocher, PA-C, 40 mg at 10/26/22 1610   gabapentin (NEURONTIN) capsule 100 mg, 100 mg, Oral, Daily PRN, Levie Heritage, DO, 100 mg at 10/25/22 0519   lactose free nutrition (Boost) liquid 237 mL, 237 mL, Oral, QPM, Levie Heritage, DO, 237 mL at 10/22/22 1832   lactulose (CHRONULAC) 10 GM/15ML solution 30 g, 30 g, Oral, BID, Harper, Kristen S, PA-C, 30 g at 10/26/22 9604   levothyroxine (SYNTHROID) tablet 175 mcg, 175 mcg, Oral, QAC breakfast, Levie Heritage, DO, 175 mcg at 10/26/22 0515   ondansetron (ZOFRAN) tablet 8 mg, 8 mg, Oral, Q8H PRN, Levie Heritage, DO, 8 mg at 10/20/22 1504   Oral care mouth rinse, 15 mL, Mouth Rinse, PRN, Vassie Loll, MD   pantoprazole (PROTONIX) EC tablet 40 mg, 40 mg, Oral, Daily, Levie Heritage, DO, 40 mg at 10/26/22 5409   potassium chloride SA (KLOR-CON M) CR tablet 40 mEq, 40 mEq, Oral, BID, Levie Heritage, DO, 40 mEq at  10/26/22 8119   rifaximin (XIFAXAN) tablet 550 mg, 550 mg, Oral, BID, Levie Heritage, DO, 550 mg at 10/26/22 1478   rOPINIRole (REQUIP) tablet 0.5 mg, 0.5 mg, Oral, Q8H PRN, Vassie Loll, MD, 0.5 mg at 10/25/22 2311   sertraline (ZOLOFT) tablet 100 mg, 100 mg, Oral, Daily, Levie Heritage, DO, 100 mg at 10/26/22 2956   spironolactone (ALDACTONE) tablet 100 mg, 100 mg, Oral, Daily, Levie Heritage, DO, 100 mg at 10/26/22 2130   spironolactone (ALDACTONE) tablet 50 mg, 50 mg, Oral, Daily, Tiffany Kocher, PA-C, 50 mg at 10/25/22 1658   I/O    Intake/Output Summary (Last 24 hours) at 10/26/2022 1142 Last data filed at 10/26/2022 0900 Gross per 24 hour  Intake 1080 ml  Output 3400 ml  Net -2320 ml     Physical Exam: Temp:  [98.1 F (36.7 C)-99.1 F (37.3 C)] 98.6 F (37 C) (08/02 0433) Pulse Rate:  [76-85] 76 (08/02 0433) Resp:  [18-21] 18 (08/02 0433) BP: (125-131)/(63-70) 125/65 (08/02 0433) SpO2:  [99 %-100 %] 99 % (08/02 0433) Weight:  [173.4 kg] 173.4 kg (08/02 0500)  Temp (24hrs), Avg:98.6 F (37 C), Min:98.1 F (36.7 C), Max:99.1 F (37.3 C)  GENERAL: The patient is AO x3,  HEENT: Head is normocephalic and atraumatic. EOMI are intact. Mouth is well hydrated and without lesions. NECK: Supple. No masses LUNGS: Clear to auscultation.  No presence of rhonchi/wheezing/rales. Adequate chest expansion HEART: RRR, normal s1 and s2. ABDOMEN: Soft, nontender, no guarding, no peritoneal signs, and nondistended. BS +. No masses. EXTREMITIES: b/l LE edema , Left >right , with erythema   Laboratory Data: CBC:     Component Value Date/Time   WBC 5.9 10/24/2022 0556   RBC 2.80 (L) 10/24/2022 0556   HGB 8.5 (L) 10/24/2022 0556   HGB 12.3 (L) 12/27/2021 1213   HCT 25.7 (L) 10/24/2022 0556   HCT 36.5 (L) 12/27/2021 1213   PLT 61 (L) 10/24/2022 0556   PLT 63 (LL) 12/27/2021 1213   MCV 91.8 10/24/2022 0556   MCV 87 12/27/2021 1213   MCH 30.4 10/24/2022 0556   MCHC 33.1  10/24/2022 0556   RDW 18.1 (H) 10/24/2022 0556   RDW 15.6 (H) 12/27/2021 1213   LYMPHSABS 0.8 10/19/2022 1323   MONOABS 0.5 10/19/2022 1323   EOSABS 0.3 10/19/2022 1323   BASOSABS 0.0 10/19/2022 1323   COAG:  Lab Results  Component Value Date   INR 1.7 (H) 10/26/2022   INR 1.5 (H) 10/24/2022   INR 1.6 (H) 10/23/2022    BMP:     Latest Ref Rng & Units 10/26/2022    4:16 AM 10/25/2022    3:53 AM 10/24/2022    3:53 AM  BMP  Glucose 70 - 99 mg/dL 409  811  914   BUN 8 - 23 mg/dL 14  20  24    Creatinine 0.61 - 1.24 mg/dL 7.82  9.56  2.13   Sodium 135 - 145 mmol/L 136  135  133   Potassium 3.5 - 5.1 mmol/L 3.9  3.8  4.1   Chloride 98 - 111 mmol/L 97  95  95   CO2 22 - 32 mmol/L 32  31  28   Calcium 8.9 - 10.3 mg/dL 8.7  8.4  8.1     HEPATIC:     Latest Ref Rng & Units 10/26/2022    4:16 AM 10/24/2022    3:53 AM 10/23/2022    4:19 AM  Hepatic Function  Total Protein 6.5 - 8.1 g/dL 6.7  6.1  5.8   Albumin 3.5 - 5.0 g/dL 4.0  2.9  2.7   AST 15 - 41 U/L 32  36  34   ALT 0 - 44 U/L 12  16  18    Alk Phosphatase 38 - 126 U/L 64  66  70   Total Bilirubin 0.3 - 1.2 mg/dL 3.3  4.2  6.0     CARDIAC:  Lab Results  Component Value Date   CKTOTAL 117 01/22/2022      Imaging: I personally reviewed and interpreted the available labs, imaging and endoscopic files.   Assessment/Plan: 67 y.o. 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  being followed by GI for diffuse anasarca and CT finding of colitis   Possible Colitis on CT: Circumferential thickening of the ascending colon noted on CT.  Likely secondary to portal colopathy, rather than colitis. Patient did not have diarrhea prior to admission.  Current diarrhea in the setting of lactulose. He was started on empiric IV antibiotics, initially on unasyn. Was transitioned to cefazolin due to  concern for leg cellulitis. Colonoscopy completed 06/2021 but cecal polyp not removed due to poor approach.   Decompensated MASH cirrhosis: MELD 3.0: 18 at 10/26/2022  4:16 AM MELD-Na: 19  at 10/26/2022  4:16 AM Calculated from: Serum Creatinine: 1.13 mg/dL at 03/31/1094  0:45 AM Serum Sodium: 136 mmol/L at 10/26/2022  4:16 AM Total Bilirubin: 3.3 mg/dL at 4/0/9811  9:14 AM Serum Albumin: 4.0 g/dL (Using max of 3.5 g/dL) at 10/01/2954  2:13 AM INR(ratio): 1.7 at 10/26/2022  4:16 AM Age at listing (hypothetical): 24 years Sex: Male at 10/26/2022  4:16 AM   He is not a transplant candidate due to multiple comorbidities. He had been transitioned to Hospice as outpatient, but he then desired to return to the palliative pathway.     Anasarca still significant but much improved, has diuresed around 50 pounds.  Currently on Lasix 40 mg twice daily and spironolactone increased to 150mg  daily  Kidney function improved today   Patient had an episode of encephalopathy noted on 7/27. Marland KitchenHe was started on lactulose. Titrate with a goal of 3-4 bowel movements per day.   Abdominal imaging up-to-date.  AFP tumor marker normal recently.     Ultimately, he is due for EGD for variceal screening and early interval colonoscopy as cecal polyp not removed last April. Pulmonary has deemed him moderate to high risk for respiratory failure. He is a poor candidate for anesthesia. Conservative measures recommended at this time.    Recs:  -PO lasix 40 BID and continue spironolactone 150 daily on discharge.  If weight increases after switching to p.o. Lasix then can increase to 80 mg twice daily  -Lactulose to titrate to 2-3 bowel movements daily   Vista Lawman, MD Gastroenterology and Hepatology [facility]   This chart has been completed using [facility] Dictation software, and while attempts have been made to ensure accuracy , certain words and phrases [place] not be transcribed as  intended

## 2022-10-26 NOTE — Consult Note (Signed)
[facility] Customer service manager [facility]) Accountable Care Organization (ACO) [facility] Liaison Note  10/26/2022  Daniel Crawford [birth year] [id number]  Location: [facility] Liaison screened the patient remotely at [facility].  Insurance: [employer] Advantage   Daniel Crawford is a 66 y.o. 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 The patient was screened for 30 day readmission hospitalization with noted extreme risk score for unplanned readmission risk with 5 IP/2 ED in 6 months.  The patient was assessed for potential [facility]) Care Management service needs for post hospital transition for care coordination. Review of patient's electronic medical record reveals patient was admitted for Anasarca. Liaison further reviewed chart on roster and EPIC indicates provider is not in network. Therefore, pt is not eligible for care management services via community.   [facility] Care Management/Population Health does not replace or interfere with any arrangements made by the Inpatient Transition of Care team.   For questions contact:   Elliot Cousin, RN, [facility] Liaison [place]   Population Health Office Hours MTWF  8:00 am-6:00 pm Off on Thursday [phone] mobile [phone] [Office toll free line] Office Hours are M-F 8:30 - 5 pm 24 hour nurse advise line [phone] [email or web address]

## 2022-10-26 NOTE — Care Management Important Message (Signed)
Important Message  Patient Details  Name: Daniel Crawford MRN: [id number] Date of Birth: 1956-03-23   Medicare Important Message Given:  Yes     [name] 10/26/2022, 3:56 PM

## 2022-10-26 NOTE — Progress Notes (Signed)
Pt discharged via WC to POV accompanied by staff members.

## 2022-10-31 DIAGNOSIS — I5032 Chronic diastolic (congestive) heart failure: Secondary | ICD-10-CM | POA: Diagnosis not present

## 2022-10-31 DIAGNOSIS — D631 Anemia in chronic kidney disease: Secondary | ICD-10-CM | POA: Diagnosis not present

## 2022-10-31 DIAGNOSIS — N1831 Chronic kidney disease, stage 3a: Secondary | ICD-10-CM | POA: Diagnosis not present

## 2022-10-31 DIAGNOSIS — J45909 Unspecified asthma, uncomplicated: Secondary | ICD-10-CM | POA: Diagnosis not present

## 2022-10-31 DIAGNOSIS — E039 Hypothyroidism, unspecified: Secondary | ICD-10-CM | POA: Diagnosis not present

## 2022-10-31 DIAGNOSIS — K7290 Hepatic failure, unspecified without coma: Secondary | ICD-10-CM | POA: Diagnosis not present

## 2022-10-31 DIAGNOSIS — I130 Hypertensive heart and chronic kidney disease with heart failure and stage 1 through stage 4 chronic kidney disease, or unspecified chronic kidney disease: Secondary | ICD-10-CM | POA: Diagnosis not present

## 2022-10-31 DIAGNOSIS — E876 Hypokalemia: Secondary | ICD-10-CM | POA: Diagnosis not present

## 2022-10-31 DIAGNOSIS — D61818 Other pancytopenia: Secondary | ICD-10-CM | POA: Diagnosis not present

## 2022-10-31 DIAGNOSIS — J9621 Acute and chronic respiratory failure with hypoxia: Secondary | ICD-10-CM | POA: Diagnosis not present

## 2022-10-31 DIAGNOSIS — D509 Iron deficiency anemia, unspecified: Secondary | ICD-10-CM | POA: Diagnosis not present

## 2022-10-31 DIAGNOSIS — K7469 Other cirrhosis of liver: Secondary | ICD-10-CM | POA: Diagnosis not present

## 2022-10-31 DIAGNOSIS — F418 Other specified anxiety disorders: Secondary | ICD-10-CM | POA: Diagnosis not present

## 2022-10-31 DIAGNOSIS — E46 Unspecified protein-calorie malnutrition: Secondary | ICD-10-CM | POA: Diagnosis not present

## 2022-10-31 DIAGNOSIS — E1122 Type 2 diabetes mellitus with diabetic chronic kidney disease: Secondary | ICD-10-CM | POA: Diagnosis not present

## 2022-10-31 DIAGNOSIS — K7682 Hepatic encephalopathy: Secondary | ICD-10-CM | POA: Diagnosis not present

## 2022-10-31 DIAGNOSIS — Z6841 Body Mass Index (BMI) 40.0 and over, adult: Secondary | ICD-10-CM | POA: Diagnosis not present

## 2022-11-01 DIAGNOSIS — N1831 Chronic kidney disease, stage 3a: Secondary | ICD-10-CM | POA: Diagnosis not present

## 2022-11-01 DIAGNOSIS — K7290 Hepatic failure, unspecified without coma: Secondary | ICD-10-CM | POA: Diagnosis not present

## 2022-11-01 DIAGNOSIS — J9621 Acute and chronic respiratory failure with hypoxia: Secondary | ICD-10-CM | POA: Diagnosis not present

## 2022-11-01 DIAGNOSIS — D509 Iron deficiency anemia, unspecified: Secondary | ICD-10-CM | POA: Diagnosis not present

## 2022-11-01 DIAGNOSIS — E46 Unspecified protein-calorie malnutrition: Secondary | ICD-10-CM | POA: Diagnosis not present

## 2022-11-01 DIAGNOSIS — F418 Other specified anxiety disorders: Secondary | ICD-10-CM | POA: Diagnosis not present

## 2022-11-01 DIAGNOSIS — K7469 Other cirrhosis of liver: Secondary | ICD-10-CM | POA: Diagnosis not present

## 2022-11-01 DIAGNOSIS — J45909 Unspecified asthma, uncomplicated: Secondary | ICD-10-CM | POA: Diagnosis not present

## 2022-11-01 DIAGNOSIS — E1122 Type 2 diabetes mellitus with diabetic chronic kidney disease: Secondary | ICD-10-CM | POA: Diagnosis not present

## 2022-11-01 DIAGNOSIS — I130 Hypertensive heart and chronic kidney disease with heart failure and stage 1 through stage 4 chronic kidney disease, or unspecified chronic kidney disease: Secondary | ICD-10-CM | POA: Diagnosis not present

## 2022-11-01 DIAGNOSIS — D631 Anemia in chronic kidney disease: Secondary | ICD-10-CM | POA: Diagnosis not present

## 2022-11-01 DIAGNOSIS — E039 Hypothyroidism, unspecified: Secondary | ICD-10-CM | POA: Diagnosis not present

## 2022-11-01 DIAGNOSIS — E876 Hypokalemia: Secondary | ICD-10-CM | POA: Diagnosis not present

## 2022-11-01 DIAGNOSIS — K7682 Hepatic encephalopathy: Secondary | ICD-10-CM | POA: Diagnosis not present

## 2022-11-01 DIAGNOSIS — I5032 Chronic diastolic (congestive) heart failure: Secondary | ICD-10-CM | POA: Diagnosis not present

## 2022-11-01 DIAGNOSIS — Z6841 Body Mass Index (BMI) 40.0 and over, adult: Secondary | ICD-10-CM | POA: Diagnosis not present

## 2022-11-01 DIAGNOSIS — D61818 Other pancytopenia: Secondary | ICD-10-CM | POA: Diagnosis not present

## 2022-11-05 DIAGNOSIS — I5032 Chronic diastolic (congestive) heart failure: Secondary | ICD-10-CM | POA: Diagnosis not present

## 2022-11-05 DIAGNOSIS — Z6841 Body Mass Index (BMI) 40.0 and over, adult: Secondary | ICD-10-CM | POA: Diagnosis not present

## 2022-11-05 DIAGNOSIS — K7682 Hepatic encephalopathy: Secondary | ICD-10-CM | POA: Diagnosis not present

## 2022-11-05 DIAGNOSIS — I130 Hypertensive heart and chronic kidney disease with heart failure and stage 1 through stage 4 chronic kidney disease, or unspecified chronic kidney disease: Secondary | ICD-10-CM | POA: Diagnosis not present

## 2022-11-05 DIAGNOSIS — E46 Unspecified protein-calorie malnutrition: Secondary | ICD-10-CM | POA: Diagnosis not present

## 2022-11-05 DIAGNOSIS — N1831 Chronic kidney disease, stage 3a: Secondary | ICD-10-CM | POA: Diagnosis not present

## 2022-11-05 DIAGNOSIS — K7469 Other cirrhosis of liver: Secondary | ICD-10-CM | POA: Diagnosis not present

## 2022-11-05 DIAGNOSIS — E1122 Type 2 diabetes mellitus with diabetic chronic kidney disease: Secondary | ICD-10-CM | POA: Diagnosis not present

## 2022-11-05 DIAGNOSIS — E039 Hypothyroidism, unspecified: Secondary | ICD-10-CM | POA: Diagnosis not present

## 2022-11-05 DIAGNOSIS — J45909 Unspecified asthma, uncomplicated: Secondary | ICD-10-CM | POA: Diagnosis not present

## 2022-11-05 DIAGNOSIS — E876 Hypokalemia: Secondary | ICD-10-CM | POA: Diagnosis not present

## 2022-11-05 DIAGNOSIS — D631 Anemia in chronic kidney disease: Secondary | ICD-10-CM | POA: Diagnosis not present

## 2022-11-05 DIAGNOSIS — K7290 Hepatic failure, unspecified without coma: Secondary | ICD-10-CM | POA: Diagnosis not present

## 2022-11-05 DIAGNOSIS — F418 Other specified anxiety disorders: Secondary | ICD-10-CM | POA: Diagnosis not present

## 2022-11-05 DIAGNOSIS — D509 Iron deficiency anemia, unspecified: Secondary | ICD-10-CM | POA: Diagnosis not present

## 2022-11-05 DIAGNOSIS — J9621 Acute and chronic respiratory failure with hypoxia: Secondary | ICD-10-CM | POA: Diagnosis not present

## 2022-11-05 DIAGNOSIS — D61818 Other pancytopenia: Secondary | ICD-10-CM | POA: Diagnosis not present

## 2022-11-07 DIAGNOSIS — Z713 Dietary counseling and surveillance: Secondary | ICD-10-CM | POA: Diagnosis not present

## 2022-11-07 DIAGNOSIS — Z09 Encounter for follow-up examination after completed treatment for conditions other than malignant neoplasm: Secondary | ICD-10-CM | POA: Diagnosis not present

## 2022-11-07 DIAGNOSIS — K7460 Unspecified cirrhosis of liver: Secondary | ICD-10-CM | POA: Diagnosis not present

## 2022-11-07 DIAGNOSIS — R609 Edema, unspecified: Secondary | ICD-10-CM | POA: Diagnosis not present

## 2022-11-08 DIAGNOSIS — L03116 Cellulitis of left lower limb: Secondary | ICD-10-CM | POA: Diagnosis not present

## 2022-11-08 DIAGNOSIS — I5032 Chronic diastolic (congestive) heart failure: Secondary | ICD-10-CM | POA: Diagnosis not present

## 2022-11-08 DIAGNOSIS — E1122 Type 2 diabetes mellitus with diabetic chronic kidney disease: Secondary | ICD-10-CM | POA: Diagnosis not present

## 2022-11-08 DIAGNOSIS — D61818 Other pancytopenia: Secondary | ICD-10-CM | POA: Diagnosis not present

## 2022-11-08 DIAGNOSIS — E46 Unspecified protein-calorie malnutrition: Secondary | ICD-10-CM | POA: Diagnosis not present

## 2022-11-08 DIAGNOSIS — F418 Other specified anxiety disorders: Secondary | ICD-10-CM | POA: Diagnosis not present

## 2022-11-08 DIAGNOSIS — Z6841 Body Mass Index (BMI) 40.0 and over, adult: Secondary | ICD-10-CM | POA: Diagnosis not present

## 2022-11-08 DIAGNOSIS — K7469 Other cirrhosis of liver: Secondary | ICD-10-CM | POA: Diagnosis not present

## 2022-11-08 DIAGNOSIS — D631 Anemia in chronic kidney disease: Secondary | ICD-10-CM | POA: Diagnosis not present

## 2022-11-08 DIAGNOSIS — D509 Iron deficiency anemia, unspecified: Secondary | ICD-10-CM | POA: Diagnosis not present

## 2022-11-08 DIAGNOSIS — N1831 Chronic kidney disease, stage 3a: Secondary | ICD-10-CM | POA: Diagnosis not present

## 2022-11-08 DIAGNOSIS — K7290 Hepatic failure, unspecified without coma: Secondary | ICD-10-CM | POA: Diagnosis not present

## 2022-11-08 DIAGNOSIS — K7682 Hepatic encephalopathy: Secondary | ICD-10-CM | POA: Diagnosis not present

## 2022-11-08 DIAGNOSIS — E876 Hypokalemia: Secondary | ICD-10-CM | POA: Diagnosis not present

## 2022-11-08 DIAGNOSIS — J45909 Unspecified asthma, uncomplicated: Secondary | ICD-10-CM | POA: Diagnosis not present

## 2022-11-08 DIAGNOSIS — J9621 Acute and chronic respiratory failure with hypoxia: Secondary | ICD-10-CM | POA: Diagnosis not present

## 2022-11-08 DIAGNOSIS — I130 Hypertensive heart and chronic kidney disease with heart failure and stage 1 through stage 4 chronic kidney disease, or unspecified chronic kidney disease: Secondary | ICD-10-CM | POA: Diagnosis not present

## 2022-11-08 DIAGNOSIS — E039 Hypothyroidism, unspecified: Secondary | ICD-10-CM | POA: Diagnosis not present

## 2022-11-12 DIAGNOSIS — K7290 Hepatic failure, unspecified without coma: Secondary | ICD-10-CM | POA: Diagnosis not present

## 2022-11-12 DIAGNOSIS — D509 Iron deficiency anemia, unspecified: Secondary | ICD-10-CM | POA: Diagnosis not present

## 2022-11-12 DIAGNOSIS — K7469 Other cirrhosis of liver: Secondary | ICD-10-CM | POA: Diagnosis not present

## 2022-11-12 DIAGNOSIS — Z6841 Body Mass Index (BMI) 40.0 and over, adult: Secondary | ICD-10-CM | POA: Diagnosis not present

## 2022-11-12 DIAGNOSIS — I130 Hypertensive heart and chronic kidney disease with heart failure and stage 1 through stage 4 chronic kidney disease, or unspecified chronic kidney disease: Secondary | ICD-10-CM | POA: Diagnosis not present

## 2022-11-12 DIAGNOSIS — L03116 Cellulitis of left lower limb: Secondary | ICD-10-CM | POA: Diagnosis not present

## 2022-11-12 DIAGNOSIS — K7682 Hepatic encephalopathy: Secondary | ICD-10-CM | POA: Diagnosis not present

## 2022-11-12 DIAGNOSIS — F418 Other specified anxiety disorders: Secondary | ICD-10-CM | POA: Diagnosis not present

## 2022-11-12 DIAGNOSIS — J9621 Acute and chronic respiratory failure with hypoxia: Secondary | ICD-10-CM | POA: Diagnosis not present

## 2022-11-12 DIAGNOSIS — E039 Hypothyroidism, unspecified: Secondary | ICD-10-CM | POA: Diagnosis not present

## 2022-11-12 DIAGNOSIS — E1122 Type 2 diabetes mellitus with diabetic chronic kidney disease: Secondary | ICD-10-CM | POA: Diagnosis not present

## 2022-11-12 DIAGNOSIS — E46 Unspecified protein-calorie malnutrition: Secondary | ICD-10-CM | POA: Diagnosis not present

## 2022-11-12 DIAGNOSIS — I5032 Chronic diastolic (congestive) heart failure: Secondary | ICD-10-CM | POA: Diagnosis not present

## 2022-11-12 DIAGNOSIS — D631 Anemia in chronic kidney disease: Secondary | ICD-10-CM | POA: Diagnosis not present

## 2022-11-12 DIAGNOSIS — D61818 Other pancytopenia: Secondary | ICD-10-CM | POA: Diagnosis not present

## 2022-11-12 DIAGNOSIS — N1831 Chronic kidney disease, stage 3a: Secondary | ICD-10-CM | POA: Diagnosis not present

## 2022-11-12 DIAGNOSIS — J45909 Unspecified asthma, uncomplicated: Secondary | ICD-10-CM | POA: Diagnosis not present

## 2022-11-12 DIAGNOSIS — E876 Hypokalemia: Secondary | ICD-10-CM | POA: Diagnosis not present

## 2022-11-14 DIAGNOSIS — E1122 Type 2 diabetes mellitus with diabetic chronic kidney disease: Secondary | ICD-10-CM | POA: Diagnosis not present

## 2022-11-14 DIAGNOSIS — I5032 Chronic diastolic (congestive) heart failure: Secondary | ICD-10-CM | POA: Diagnosis not present

## 2022-11-14 DIAGNOSIS — K7469 Other cirrhosis of liver: Secondary | ICD-10-CM | POA: Diagnosis not present

## 2022-11-14 DIAGNOSIS — D509 Iron deficiency anemia, unspecified: Secondary | ICD-10-CM | POA: Diagnosis not present

## 2022-11-14 DIAGNOSIS — N1831 Chronic kidney disease, stage 3a: Secondary | ICD-10-CM | POA: Diagnosis not present

## 2022-11-14 DIAGNOSIS — E876 Hypokalemia: Secondary | ICD-10-CM | POA: Diagnosis not present

## 2022-11-14 DIAGNOSIS — F418 Other specified anxiety disorders: Secondary | ICD-10-CM | POA: Diagnosis not present

## 2022-11-14 DIAGNOSIS — D61818 Other pancytopenia: Secondary | ICD-10-CM | POA: Diagnosis not present

## 2022-11-14 DIAGNOSIS — K7682 Hepatic encephalopathy: Secondary | ICD-10-CM | POA: Diagnosis not present

## 2022-11-14 DIAGNOSIS — E039 Hypothyroidism, unspecified: Secondary | ICD-10-CM | POA: Diagnosis not present

## 2022-11-14 DIAGNOSIS — L03116 Cellulitis of left lower limb: Secondary | ICD-10-CM | POA: Diagnosis not present

## 2022-11-14 DIAGNOSIS — J45909 Unspecified asthma, uncomplicated: Secondary | ICD-10-CM | POA: Diagnosis not present

## 2022-11-14 DIAGNOSIS — J9621 Acute and chronic respiratory failure with hypoxia: Secondary | ICD-10-CM | POA: Diagnosis not present

## 2022-11-14 DIAGNOSIS — D631 Anemia in chronic kidney disease: Secondary | ICD-10-CM | POA: Diagnosis not present

## 2022-11-14 DIAGNOSIS — Z6841 Body Mass Index (BMI) 40.0 and over, adult: Secondary | ICD-10-CM | POA: Diagnosis not present

## 2022-11-14 DIAGNOSIS — E46 Unspecified protein-calorie malnutrition: Secondary | ICD-10-CM | POA: Diagnosis not present

## 2022-11-14 DIAGNOSIS — K7290 Hepatic failure, unspecified without coma: Secondary | ICD-10-CM | POA: Diagnosis not present

## 2022-11-14 DIAGNOSIS — I130 Hypertensive heart and chronic kidney disease with heart failure and stage 1 through stage 4 chronic kidney disease, or unspecified chronic kidney disease: Secondary | ICD-10-CM | POA: Diagnosis not present

## 2022-11-15 DIAGNOSIS — E039 Hypothyroidism, unspecified: Secondary | ICD-10-CM | POA: Diagnosis not present

## 2022-11-15 DIAGNOSIS — D61818 Other pancytopenia: Secondary | ICD-10-CM | POA: Diagnosis not present

## 2022-11-15 DIAGNOSIS — E876 Hypokalemia: Secondary | ICD-10-CM | POA: Diagnosis not present

## 2022-11-15 DIAGNOSIS — J9621 Acute and chronic respiratory failure with hypoxia: Secondary | ICD-10-CM | POA: Diagnosis not present

## 2022-11-15 DIAGNOSIS — K7682 Hepatic encephalopathy: Secondary | ICD-10-CM | POA: Diagnosis not present

## 2022-11-15 DIAGNOSIS — N1831 Chronic kidney disease, stage 3a: Secondary | ICD-10-CM | POA: Diagnosis not present

## 2022-11-15 DIAGNOSIS — E46 Unspecified protein-calorie malnutrition: Secondary | ICD-10-CM | POA: Diagnosis not present

## 2022-11-15 DIAGNOSIS — D631 Anemia in chronic kidney disease: Secondary | ICD-10-CM | POA: Diagnosis not present

## 2022-11-15 DIAGNOSIS — E1122 Type 2 diabetes mellitus with diabetic chronic kidney disease: Secondary | ICD-10-CM | POA: Diagnosis not present

## 2022-11-15 DIAGNOSIS — I5032 Chronic diastolic (congestive) heart failure: Secondary | ICD-10-CM | POA: Diagnosis not present

## 2022-11-15 DIAGNOSIS — Z6841 Body Mass Index (BMI) 40.0 and over, adult: Secondary | ICD-10-CM | POA: Diagnosis not present

## 2022-11-15 DIAGNOSIS — I130 Hypertensive heart and chronic kidney disease with heart failure and stage 1 through stage 4 chronic kidney disease, or unspecified chronic kidney disease: Secondary | ICD-10-CM | POA: Diagnosis not present

## 2022-11-15 DIAGNOSIS — L03116 Cellulitis of left lower limb: Secondary | ICD-10-CM | POA: Diagnosis not present

## 2022-11-15 DIAGNOSIS — K7290 Hepatic failure, unspecified without coma: Secondary | ICD-10-CM | POA: Diagnosis not present

## 2022-11-15 DIAGNOSIS — J45909 Unspecified asthma, uncomplicated: Secondary | ICD-10-CM | POA: Diagnosis not present

## 2022-11-15 DIAGNOSIS — D509 Iron deficiency anemia, unspecified: Secondary | ICD-10-CM | POA: Diagnosis not present

## 2022-11-15 DIAGNOSIS — K7469 Other cirrhosis of liver: Secondary | ICD-10-CM | POA: Diagnosis not present

## 2022-11-15 DIAGNOSIS — F418 Other specified anxiety disorders: Secondary | ICD-10-CM | POA: Diagnosis not present

## 2022-11-19 ENCOUNTER — Ambulatory Visit: Payer: Medicare HMO | Attending: Pulmonary Disease | Admitting: Pulmonary Disease

## 2022-11-19 DIAGNOSIS — K7290 Hepatic failure, unspecified without coma: Secondary | ICD-10-CM | POA: Diagnosis not present

## 2022-11-19 DIAGNOSIS — D509 Iron deficiency anemia, unspecified: Secondary | ICD-10-CM | POA: Diagnosis not present

## 2022-11-19 DIAGNOSIS — E876 Hypokalemia: Secondary | ICD-10-CM | POA: Diagnosis not present

## 2022-11-19 DIAGNOSIS — G4733 Obstructive sleep apnea (adult) (pediatric): Secondary | ICD-10-CM | POA: Diagnosis not present

## 2022-11-19 DIAGNOSIS — F418 Other specified anxiety disorders: Secondary | ICD-10-CM | POA: Diagnosis not present

## 2022-11-19 DIAGNOSIS — I130 Hypertensive heart and chronic kidney disease with heart failure and stage 1 through stage 4 chronic kidney disease, or unspecified chronic kidney disease: Secondary | ICD-10-CM | POA: Diagnosis not present

## 2022-11-19 DIAGNOSIS — D631 Anemia in chronic kidney disease: Secondary | ICD-10-CM | POA: Diagnosis not present

## 2022-11-19 DIAGNOSIS — E46 Unspecified protein-calorie malnutrition: Secondary | ICD-10-CM | POA: Diagnosis not present

## 2022-11-19 DIAGNOSIS — E1122 Type 2 diabetes mellitus with diabetic chronic kidney disease: Secondary | ICD-10-CM | POA: Diagnosis not present

## 2022-11-19 DIAGNOSIS — Z6841 Body Mass Index (BMI) 40.0 and over, adult: Secondary | ICD-10-CM | POA: Diagnosis not present

## 2022-11-19 DIAGNOSIS — J45909 Unspecified asthma, uncomplicated: Secondary | ICD-10-CM | POA: Diagnosis not present

## 2022-11-19 DIAGNOSIS — K7682 Hepatic encephalopathy: Secondary | ICD-10-CM | POA: Diagnosis not present

## 2022-11-19 DIAGNOSIS — K7469 Other cirrhosis of liver: Secondary | ICD-10-CM | POA: Diagnosis not present

## 2022-11-19 DIAGNOSIS — I5032 Chronic diastolic (congestive) heart failure: Secondary | ICD-10-CM | POA: Diagnosis not present

## 2022-11-19 DIAGNOSIS — J9621 Acute and chronic respiratory failure with hypoxia: Secondary | ICD-10-CM | POA: Diagnosis not present

## 2022-11-19 DIAGNOSIS — N1831 Chronic kidney disease, stage 3a: Secondary | ICD-10-CM | POA: Diagnosis not present

## 2022-11-19 DIAGNOSIS — L03116 Cellulitis of left lower limb: Secondary | ICD-10-CM | POA: Diagnosis not present

## 2022-11-19 DIAGNOSIS — E039 Hypothyroidism, unspecified: Secondary | ICD-10-CM | POA: Diagnosis not present

## 2022-11-19 DIAGNOSIS — D61818 Other pancytopenia: Secondary | ICD-10-CM | POA: Diagnosis not present

## 2022-11-21 ENCOUNTER — Telehealth: Payer: Self-pay | Admitting: Pulmonary Disease

## 2022-11-21 DIAGNOSIS — G4733 Obstructive sleep apnea (adult) (pediatric): Secondary | ICD-10-CM | POA: Diagnosis not present

## 2022-11-21 NOTE — Procedures (Signed)
Patient Name: Daniel Crawford, Daniel Crawford Study Date: 11/19/2022 Gender: Male D.O.B: [birth year] Age (years): 69 Referring Provider: Cyril Mourning MD, ABSM Height (inches): 74 Interpreting Physician: Cyril Mourning MD, ABSM Weight (lbs): 387 RPSGT: Alfonso Ellis BMI: 50 MRN: [id number] Neck Size: 18.00 <br> <br> CLINICAL INFORMATION Sleep Study Type: NPSG    Indication for sleep study: obesity, snoring, non refreshing sleep    Epworth Sleepiness Score: 3    SLEEP STUDY TECHNIQUE As per the AASM Manual for the Scoring of Sleep and Associated Events v2.3 (April 2016) with a hypopnea requiring 4% desaturations.  The channels recorded and monitored were frontal, central and occipital EEG, electrooculogram (EOG), submentalis EMG (chin), nasal and oral airflow, thoracic and abdominal wall motion, anterior tibialis EMG, snore microphone, electrocardiogram, and pulse oximetry.  MEDICATIONS Medications self-administered by patient taken the night of the study : HYDROXYZINE, TRAZODONE, ZOFRAN  SLEEP ARCHITECTURE The study was initiated at 10:07:12 PM and ended at 4:32:59 AM.  Sleep onset time was 19.8 minutes and the sleep efficiency was 83.9%. The total sleep time was 323.5 minutes.  Stage REM latency was 225.0 minutes.  The patient spent 1.70% of the night in stage N1 sleep, 79.75% in stage N2 sleep, 3.86% in stage N3 and 14.7% in REM.  Alpha intrusion was absent.  Supine sleep was 100.00%.  RESPIRATORY PARAMETERS The overall apnea/hypopnea index (AHI) was 7.0 per hour. There were 4 total apneas, including 4 obstructive, 0 central and 0 mixed apneas. There were 34 hypopneas and 0 RERAs.  The AHI during Stage REM sleep was 36.6 per hour.  AHI while supine was 7.0 per hour.  The mean oxygen saturation was 91.67%. The minimum SpO2 during sleep was 81.00%.  moderate snoring was noted during this study.  CARDIAC DATA The 2 lead EKG demonstrated sinus rhythm. The mean heart rate was 82.11 beats  per minute. Other EKG findings include: None.   LEG MOVEMENT DATA The total PLMS were 704 with a resulting PLMS index of 130.57. Associated arousal with leg movement index was 6.1 .  IMPRESSIONS - Mild obstructive sleep apnea occurred during this study (AHI = 7.0/h). - Mild oxygen desaturation was noted during this study (Min O2 = 81.00%). Moderate sustained desaturation during REM sleep - The patient snored with moderate snoring volume. - No cardiac abnormalities were noted during this study. - Severe periodic limb movements of sleep occurred during the study. Associated arousals were significant.   DIAGNOSIS - Obstructive Sleep Apnea (G47.33) - Periodic Limb Movement During Sleep (G47.61) - Nocturnal Hypoxemia (G47.36)  RECOMMENDATIONS - Positional therapy avoiding supine position during sleep. - Consider treatment of Periodic Leg Movements of Sleep. - Very mild obstructive sleep apnea. Return to discuss treatment options. - Avoid alcohol, sedatives and other CNS depressants that [place] worsen sleep apnea and disrupt normal sleep architecture. - Sleep hygiene should be reviewed to assess factors that [place] improve sleep quality. - Weight management and regular exercise should be initiated or continued if appropriate.  [Electronically signed] 11/21/2022 08:15 PM  Cyril Mourning MD, ABSM Diplomate, American Board of Sleep Medicine NPI: [id number]

## 2022-11-21 NOTE — Telephone Encounter (Signed)
NPSG showed mild  OSA with AHI 7/ hr but desat was down to 81% Suggest autoCPAP  5-15 cm, mask of choice OV with me/APP in 6 - 8 wks after starting

## 2022-11-27 ENCOUNTER — Ambulatory Visit: Payer: Medicare HMO | Admitting: Pulmonary Disease

## 2022-11-27 DIAGNOSIS — K7290 Hepatic failure, unspecified without coma: Secondary | ICD-10-CM | POA: Diagnosis not present

## 2022-11-27 DIAGNOSIS — D631 Anemia in chronic kidney disease: Secondary | ICD-10-CM | POA: Diagnosis not present

## 2022-11-27 DIAGNOSIS — E039 Hypothyroidism, unspecified: Secondary | ICD-10-CM | POA: Diagnosis not present

## 2022-11-27 DIAGNOSIS — K7469 Other cirrhosis of liver: Secondary | ICD-10-CM | POA: Diagnosis not present

## 2022-11-27 DIAGNOSIS — L03116 Cellulitis of left lower limb: Secondary | ICD-10-CM | POA: Diagnosis not present

## 2022-11-27 DIAGNOSIS — I130 Hypertensive heart and chronic kidney disease with heart failure and stage 1 through stage 4 chronic kidney disease, or unspecified chronic kidney disease: Secondary | ICD-10-CM | POA: Diagnosis not present

## 2022-11-27 DIAGNOSIS — N1831 Chronic kidney disease, stage 3a: Secondary | ICD-10-CM | POA: Diagnosis not present

## 2022-11-27 DIAGNOSIS — D509 Iron deficiency anemia, unspecified: Secondary | ICD-10-CM | POA: Diagnosis not present

## 2022-11-27 DIAGNOSIS — J9621 Acute and chronic respiratory failure with hypoxia: Secondary | ICD-10-CM | POA: Diagnosis not present

## 2022-11-27 DIAGNOSIS — D61818 Other pancytopenia: Secondary | ICD-10-CM | POA: Diagnosis not present

## 2022-11-27 DIAGNOSIS — J45909 Unspecified asthma, uncomplicated: Secondary | ICD-10-CM | POA: Diagnosis not present

## 2022-11-27 DIAGNOSIS — E876 Hypokalemia: Secondary | ICD-10-CM | POA: Diagnosis not present

## 2022-11-27 DIAGNOSIS — F418 Other specified anxiety disorders: Secondary | ICD-10-CM | POA: Diagnosis not present

## 2022-11-27 DIAGNOSIS — E46 Unspecified protein-calorie malnutrition: Secondary | ICD-10-CM | POA: Diagnosis not present

## 2022-11-27 DIAGNOSIS — I5032 Chronic diastolic (congestive) heart failure: Secondary | ICD-10-CM | POA: Diagnosis not present

## 2022-11-27 DIAGNOSIS — E1122 Type 2 diabetes mellitus with diabetic chronic kidney disease: Secondary | ICD-10-CM | POA: Diagnosis not present

## 2022-11-27 DIAGNOSIS — K7682 Hepatic encephalopathy: Secondary | ICD-10-CM | POA: Diagnosis not present

## 2022-11-27 DIAGNOSIS — Z6841 Body Mass Index (BMI) 40.0 and over, adult: Secondary | ICD-10-CM | POA: Diagnosis not present

## 2022-11-28 ENCOUNTER — Encounter (HOSPITAL_COMMUNITY): Payer: Self-pay | Admitting: Cardiology

## 2022-11-28 ENCOUNTER — Ambulatory Visit (HOSPITAL_COMMUNITY)
Admission: RE | Admit: 2022-11-28 | Discharge: 2022-11-28 | Disposition: A | Discharge status: Home / Self Care | Payer: Medicare Other | Source: Ambulatory Visit | Attending: Cardiology | Admitting: Cardiology

## 2022-11-28 ENCOUNTER — Other Ambulatory Visit (HOSPITAL_COMMUNITY): Payer: Self-pay

## 2022-11-28 VITALS — BP 130/80 | HR 63 | Wt 380.0 lb

## 2022-11-28 DIAGNOSIS — K7581 Nonalcoholic steatohepatitis (NASH): Secondary | ICD-10-CM | POA: Insufficient documentation

## 2022-11-28 DIAGNOSIS — Z87891 Personal history of nicotine dependence: Secondary | ICD-10-CM | POA: Insufficient documentation

## 2022-11-28 DIAGNOSIS — I5032 Chronic diastolic (congestive) heart failure: Secondary | ICD-10-CM | POA: Insufficient documentation

## 2022-11-28 DIAGNOSIS — D696 Thrombocytopenia, unspecified: Secondary | ICD-10-CM | POA: Diagnosis not present

## 2022-11-28 DIAGNOSIS — R9431 Abnormal electrocardiogram [ECG] [EKG]: Secondary | ICD-10-CM | POA: Diagnosis not present

## 2022-11-28 DIAGNOSIS — K7460 Unspecified cirrhosis of liver: Secondary | ICD-10-CM | POA: Diagnosis not present

## 2022-11-28 DIAGNOSIS — I130 Hypertensive heart and chronic kidney disease with heart failure and stage 1 through stage 4 chronic kidney disease, or unspecified chronic kidney disease: Secondary | ICD-10-CM | POA: Insufficient documentation

## 2022-11-28 DIAGNOSIS — J449 Chronic obstructive pulmonary disease, unspecified: Secondary | ICD-10-CM | POA: Insufficient documentation

## 2022-11-28 DIAGNOSIS — N183 Chronic kidney disease, stage 3 unspecified: Secondary | ICD-10-CM | POA: Diagnosis not present

## 2022-11-28 DIAGNOSIS — K7469 Other cirrhosis of liver: Secondary | ICD-10-CM | POA: Diagnosis not present

## 2022-11-28 DIAGNOSIS — E1122 Type 2 diabetes mellitus with diabetic chronic kidney disease: Secondary | ICD-10-CM | POA: Insufficient documentation

## 2022-11-28 LAB — BASIC METABOLIC PANEL
Anion gap: 6 (ref 5–15)
BUN: 19 mg/dL (ref 8–23)
CO2: 30 mmol/L (ref 22–32)
Calcium: 8.8 mg/dL — ABNORMAL LOW (ref 8.9–10.3)
Chloride: 99 mmol/L (ref 98–111)
Creatinine, Ser: 1.52 mg/dL — ABNORMAL HIGH (ref 0.61–1.24)
GFR, Estimated: 51 mL/min — ABNORMAL LOW (ref >60)
Glucose, Bld: 93 mg/dL (ref 70–99)
Potassium: 4.3 mmol/L (ref 3.5–5.1)
Sodium: 135 mmol/L (ref 135–145)

## 2022-11-28 LAB — BRAIN NATRIURETIC PEPTIDE: B Natriuretic Peptide: 73.9 pg/mL (ref 0.0–100.0)

## 2022-11-28 MED ORDER — TORSEMIDE 20 MG PO TABS: 60.0000 mg | ORAL_TABLET | Freq: Two times a day (BID) | ORAL | 3 refills | Status: DC

## 2022-11-28 MED ORDER — DAPAGLIFLOZIN PROPANEDIOL 10 MG PO TABS: 10.0000 mg | ORAL_TABLET | Freq: Every day | ORAL | 3 refills | Status: DC

## 2022-11-28 NOTE — Progress Notes (Signed)
PCP: [name], MD Cardiology: Dr. Diona Browner HF Cardiology: Dr. Shirlee Latch  67 y.o. with history of NASH cirrhosis, chronic diastolic CHF, DM2, CKD stage 3, and COPD was referred by Dr. Diona Browner for evaluation of CHF.  Patient has cirrhosis due to NASH with splenomegaly, thrombocytopenia, and h/o hepatic encephalopathy.  Liver biopsy in 1/23 showed stage IV cirrhosis.  Patient has struggled with volume overload.  He was followed in the heart failure program at [facility] before moving to [place].  RHC in 10/23 showed mildly elevated filling pressures with pulmonary venous hypertension.  Echo in 4/24 showed EF 55%, mild LVH, normal RV systolic function with mild RV enlargement.  He has had several recent admissions with CHF/anasarca and hepatic encephalopathy.  He has never had enough ascites for paracentesis.  He was admitted twice in 7/24 for CHF and diuresed.  Cardiology was not consulted.  For reasons that remain obscure to me, he was taken off torsemide and put back on Lasix.   Today he returns for HF follow up. He has gained about 20 lbs since his last appointment with me.  He walks with a cane, reports dyspnea after walking about 100-200 feet. He has orthopnea and sleeps in a recliner.  He feels like the Lasix is not working as well as torsemide did to keep volume off.  He has worsening peripheral edema.  No lightheadedness. No palpitations.  No chest pain.   ECG (personally reviewed): NSR, 1st degree AVB, QTc 501 msec  Labs (4/24): NH3 74, K 3.7, creatinine 1.31, BNP 47 Labs (5/24): K 4.1, creatinine 1.67 Labs (8/24): K 3.9, creatinine 1.13, hgb 8.5, plts 61  PMH: 1. Cirrhosis: Due to NASH.  Splenomegaly, chronic thrombocytopenia, history of hepatic encephalopathy.  - Liver biopsy in 1/23 with stage IV cirrhosis.  2. Thrombocytopenia: Due to splenomegaly.  3. Hypothyroidism 4. HTN 5. Type 2 diabetes 6. CKD stage 3 7. COPD: Moderate obstruction on PFTs, prior smoker.  8. HFpEF: RHC (10/23)  with mean RA 15, PA 36/20 mean 28, mean PCWP 19, PVR 1.2 WU, CI 2.65.  - Echo (4/24): EF 55%, mild LVH, normal RV systolic function with mild RV enlargement.  9. Sleep study: No OSA.   SH: Lives in [place], single, quit smoking 10 years ago, no ETOH.   Family History  Problem Relation Age of Onset   Liver cancer Mother    Heart disease Mother    Aneurysm Sister    ROS: All systems reviewed and negative except as per HPI.   Current Outpatient Medications  Medication Sig Dispense Refill   acetaminophen (TYLENOL) 500 MG tablet Take 1,000 mg by mouth every 6 (six) hours as needed for moderate pain.     albuterol (VENTOLIN HFA) 108 (90 Base) MCG/ACT inhaler Inhale 2 puffs into the lungs every 6 (six) hours as needed for wheezing or shortness of breath. 18 g 2   carvedilol (COREG) 6.25 MG tablet Take 6.25 mg by mouth 2 (two) times daily with a meal.     dapagliflozin propanediol (FARXIGA) 10 MG TABS tablet Take 1 tablet (10 mg total) by mouth daily before breakfast. 90 tablet 3   hydrOXYzine (VISTARIL) 50 MG capsule Take 1 capsule (50 mg total) by mouth 3 (three) times daily as needed for anxiety or nausea. 90 capsule 2   lactose free nutrition (BOOST) LIQD Take 237 mLs by mouth every evening.     lactulose (CHRONULAC) 10 GM/15ML solution Take 30 mLs (20 g total) by mouth 2 (two) times  daily. 1800 mL 2   levothyroxine (SYNTHROID) 175 MCG tablet Take 175 mcg by mouth daily before breakfast.     ondansetron (ZOFRAN) 8 MG tablet Take by mouth every 8 (eight) hours as needed for nausea or vomiting.     pantoprazole (PROTONIX) 40 MG tablet Take 1 tablet (40 mg total) by mouth daily. 90 tablet 3   potassium chloride SA (KLOR-CON M) 20 MEQ tablet Take 2 tablets (40 mEq total) by mouth 2 (two) times daily. 180 tablet 3   rifaximin (XIFAXAN) 550 MG TABS tablet Take 1 tablet (550 mg total) by mouth 2 (two) times daily. 180 tablet 3   sertraline (ZOLOFT) 100 MG tablet Take 100 mg by mouth daily.      spironolactone (ALDACTONE) 100 MG tablet Take 100 mg by mouth daily.     torsemide (DEMADEX) 20 MG tablet Take 3 tablets (60 mg total) by mouth 2 (two) times daily. 180 tablet 3   traZODone (DESYREL) 50 MG tablet Take 1 tablet (50 mg total) by mouth at bedtime. 90 tablet 3   gabapentin (NEURONTIN) 100 MG capsule Take 100 mg by mouth daily as needed (pain). (Patient not taking: Reported on 11/28/2022)     No current facility-administered medications for this encounter.   Wt Readings from Last 3 Encounters:  11/28/22 (!) 172.4 kg (380 lb)  10/26/22 (!) 173.4 kg (382 lb 4.4 oz)  10/18/22 (!) 182.8 kg (402 lb 14.4 oz)   BP 130/80   Pulse 63   Wt (!) 172.4 kg (380 lb)   SpO2 99%   BMI 48.79 kg/m  General: NAD Neck: JVP 10-12 cm, no thyromegaly or thyroid nodule.  Lungs: Clear to auscultation bilaterally with normal respiratory effort. CV: Nondisplaced PMI.  Heart regular S1/S2, no S3/S4, no murmur.  1+ edema to thighs.  No carotid bruit.  Normal pedal pulses.  Abdomen: Soft, nontender, no hepatosplenomegaly, no distention.  Skin: Intact without lesions or rashes.  Neurologic: Alert and oriented x 3.  Psych: Normal affect. Extremities: No clubbing or cyanosis.  HEENT: Normal.   Assessment/Plan: 1.  Chronic diastolic CHF: RHC in 10/23 with mildly elevated filling pressures and pulmonary venous hypertension.  Echo in 4/24 showed EF 55%, mild LVH, normal RV systolic function with mild RV enlargement.  Patient has struggled with volume retention in setting of diastolic CHF and cirrhosis. On exam today, he is volume overloaded with NYHA class III symptoms.  Weight up 20 lbs since he was switched from Lasix to torsemide.   - Stop Lasix, restart torsemide 60 mg bid.  BMET/BNP today and BMET in 10 days.  Would call cardiology if he is admitted again with volume overload. Would not transition back to Lasix, this medication is not absorbed as well as torsemide.  - Continue KCL 40 mEq bid (Avoid  hypokalemia with h/o hepatic encephalopathy).  - Continue spironolactone 100 mg daily.  He does not appear to have significant ascites on exam.  - Start Farxiga 10 mg daily. - He would be a reasonable Cardiomems candidate.   - Wear compression stockings.  - Refer to paramedicine.  2. Cirrhosis: Stage IV by liver biopsy in 1/23.  Suspect due to NASH. He has h/o hepatic encephalopathy as well as splenomegaly with thrombocytopenia.  - Follows with GI in [place].  - Recent LFTs stable. 3. Thrombocytopenia: Due to cirrhosis/splenomegaly. Stable/chronic.  4. COPD: No longer smokes.  Moderate obstruction on PFTs.   Follow up in 3 wks with APP.   Mykeria Garman  Shirlee Latch  11/28/2022

## 2022-11-28 NOTE — Patient Instructions (Addendum)
Medication Changes:  STOP LASIX (FUROSEMIDE)   START: TORSEMIDE 60MG  TWICE DAILY   START: FARXIGA 10MG  ONCE DAILY   Lab Work:  Labs done today, your results will be available in MyChart, we will contact you for abnormal readings.  THEN RETURN IN 10 DAYS FOR REPEAT BLOOD WORK AT [facility] IN [place]- EITHER AT [place] OR ACROSS THE STREET   Special Instructions // Education:  PRESCRIPTION GIVEN FOR COMPRESSION STOCKINGS  Follow-Up in: 3 WEEKS WITH APP AS SCHEDULED   At the [facility], you and your health needs are our priority. We have a designated team specialized in the treatment of Heart Failure. This Care Team includes your primary Heart Failure Specialized Cardiologist (physician), Advanced Practice Providers (APPs- Physician Assistants and Nurse Practitioners), and Pharmacist who all work together to provide you with the care you need, when you need it.   You Derner see any of the following providers on your designated Care Team at your next follow up:  Dr. Arvilla Meres Dr. Marca Ancona Dr. Marcos Eke, NP Robbie Lis, [place] [facility] [place], [place] Brynda Peon, NP Karle Plumber, PharmD   Please be sure to bring in all your medications bottles to every appointment.   Need to Contact us:  If you have any questions or concerns before your next appointment please send [place] a message through [facility] or call our office at [phone].    TO LEAVE A MESSAGE FOR THE NURSE SELECT OPTION 2, PLEASE LEAVE A MESSAGE INCLUDING: YOUR NAME DATE OF BIRTH CALL BACK NUMBER REASON FOR CALL**this is important as we prioritize the call backs  YOU WILL RECEIVE A CALL BACK THE SAME DAY AS LONG AS YOU CALL BEFORE 4:00 PM

## 2022-11-28 NOTE — Telephone Encounter (Signed)
Spoke with patient regarding results and recommendations. Patient voiced understanding. Patient declines to start CPAP therapy at this time, he states he "has too many other medical issues right now". Patient is aware to call to schedule ROV 03/2023 per OV notes.

## 2022-11-29 DIAGNOSIS — D61818 Other pancytopenia: Secondary | ICD-10-CM | POA: Diagnosis not present

## 2022-11-29 DIAGNOSIS — D509 Iron deficiency anemia, unspecified: Secondary | ICD-10-CM | POA: Diagnosis not present

## 2022-11-29 DIAGNOSIS — K7682 Hepatic encephalopathy: Secondary | ICD-10-CM | POA: Diagnosis not present

## 2022-11-29 DIAGNOSIS — J9621 Acute and chronic respiratory failure with hypoxia: Secondary | ICD-10-CM | POA: Diagnosis not present

## 2022-11-29 DIAGNOSIS — E1122 Type 2 diabetes mellitus with diabetic chronic kidney disease: Secondary | ICD-10-CM | POA: Diagnosis not present

## 2022-11-29 DIAGNOSIS — E876 Hypokalemia: Secondary | ICD-10-CM | POA: Diagnosis not present

## 2022-11-29 DIAGNOSIS — E46 Unspecified protein-calorie malnutrition: Secondary | ICD-10-CM | POA: Diagnosis not present

## 2022-11-29 DIAGNOSIS — D631 Anemia in chronic kidney disease: Secondary | ICD-10-CM | POA: Diagnosis not present

## 2022-11-29 DIAGNOSIS — E039 Hypothyroidism, unspecified: Secondary | ICD-10-CM | POA: Diagnosis not present

## 2022-11-29 DIAGNOSIS — J45909 Unspecified asthma, uncomplicated: Secondary | ICD-10-CM | POA: Diagnosis not present

## 2022-11-29 DIAGNOSIS — I5032 Chronic diastolic (congestive) heart failure: Secondary | ICD-10-CM | POA: Diagnosis not present

## 2022-11-29 DIAGNOSIS — K7290 Hepatic failure, unspecified without coma: Secondary | ICD-10-CM | POA: Diagnosis not present

## 2022-11-29 DIAGNOSIS — K7469 Other cirrhosis of liver: Secondary | ICD-10-CM | POA: Diagnosis not present

## 2022-11-29 DIAGNOSIS — N1831 Chronic kidney disease, stage 3a: Secondary | ICD-10-CM | POA: Diagnosis not present

## 2022-11-29 DIAGNOSIS — L03116 Cellulitis of left lower limb: Secondary | ICD-10-CM | POA: Diagnosis not present

## 2022-11-29 DIAGNOSIS — Z6841 Body Mass Index (BMI) 40.0 and over, adult: Secondary | ICD-10-CM | POA: Diagnosis not present

## 2022-11-29 DIAGNOSIS — F418 Other specified anxiety disorders: Secondary | ICD-10-CM | POA: Diagnosis not present

## 2022-11-29 DIAGNOSIS — I130 Hypertensive heart and chronic kidney disease with heart failure and stage 1 through stage 4 chronic kidney disease, or unspecified chronic kidney disease: Secondary | ICD-10-CM | POA: Diagnosis not present

## 2022-11-29 NOTE — Addendum Note (Signed)
Encounter addended by: Burna Sis, LCSW on: 11/29/2022 3:59 PM  Actions taken: Clinical Note Signed

## 2022-11-29 NOTE — Progress Notes (Signed)
H&V Care Navigation CSW Progress Note  Clinical Social Worker met with pt regarding referral to [employer].  Pt is agreeable to this and would love to stay out of the hospital for as long as he is able.  Pt lives alone but reports he has help from neighbors.  Reports issues with depression due to chronic illness but is not interested in seeking help at this time.  Feels as if with support system of friends and his church he is making it through ok.  Reports desire to have in home assistance but cannot afford-makes about $1,350/month which mainly goes to home expenses.    CSW referred to [facility] and sent their team a message about referral with hopes their social worker could see if there are any [place] services that could assist him.   SDOH Screenings   Food Insecurity: No Food Insecurity (10/19/2022)  Housing: Low Risk  (10/19/2022)  Transportation Needs: No Transportation Needs (10/19/2022)  Utilities: Not At Risk (10/19/2022)  Depression (PHQ2-9): Medium Risk (10/27/2019)  Financial Resource Strain: Low Risk  (03/28/2021)   Received from [facility]  Tobacco Use: Medium Risk (11/28/2022)   No further needs at this time  Burna Sis, LCSW Clinical Social Worker [facility] Desk#: [phone] Cell#: [phone]

## 2022-11-30 ENCOUNTER — Encounter (HOSPITAL_COMMUNITY): Payer: Medicare HMO | Admitting: Cardiology

## 2022-12-04 DIAGNOSIS — K7682 Hepatic encephalopathy: Secondary | ICD-10-CM | POA: Diagnosis not present

## 2022-12-04 DIAGNOSIS — I5032 Chronic diastolic (congestive) heart failure: Secondary | ICD-10-CM | POA: Diagnosis not present

## 2022-12-04 DIAGNOSIS — F418 Other specified anxiety disorders: Secondary | ICD-10-CM | POA: Diagnosis not present

## 2022-12-04 DIAGNOSIS — I130 Hypertensive heart and chronic kidney disease with heart failure and stage 1 through stage 4 chronic kidney disease, or unspecified chronic kidney disease: Secondary | ICD-10-CM | POA: Diagnosis not present

## 2022-12-04 DIAGNOSIS — D631 Anemia in chronic kidney disease: Secondary | ICD-10-CM | POA: Diagnosis not present

## 2022-12-04 DIAGNOSIS — L03116 Cellulitis of left lower limb: Secondary | ICD-10-CM | POA: Diagnosis not present

## 2022-12-04 DIAGNOSIS — Z6841 Body Mass Index (BMI) 40.0 and over, adult: Secondary | ICD-10-CM | POA: Diagnosis not present

## 2022-12-04 DIAGNOSIS — J45909 Unspecified asthma, uncomplicated: Secondary | ICD-10-CM | POA: Diagnosis not present

## 2022-12-04 DIAGNOSIS — E1122 Type 2 diabetes mellitus with diabetic chronic kidney disease: Secondary | ICD-10-CM | POA: Diagnosis not present

## 2022-12-04 DIAGNOSIS — D509 Iron deficiency anemia, unspecified: Secondary | ICD-10-CM | POA: Diagnosis not present

## 2022-12-04 DIAGNOSIS — D61818 Other pancytopenia: Secondary | ICD-10-CM | POA: Diagnosis not present

## 2022-12-04 DIAGNOSIS — E039 Hypothyroidism, unspecified: Secondary | ICD-10-CM | POA: Diagnosis not present

## 2022-12-04 DIAGNOSIS — K7469 Other cirrhosis of liver: Secondary | ICD-10-CM | POA: Diagnosis not present

## 2022-12-04 DIAGNOSIS — J9621 Acute and chronic respiratory failure with hypoxia: Secondary | ICD-10-CM | POA: Diagnosis not present

## 2022-12-04 DIAGNOSIS — K7290 Hepatic failure, unspecified without coma: Secondary | ICD-10-CM | POA: Diagnosis not present

## 2022-12-04 DIAGNOSIS — N1831 Chronic kidney disease, stage 3a: Secondary | ICD-10-CM | POA: Diagnosis not present

## 2022-12-04 DIAGNOSIS — E876 Hypokalemia: Secondary | ICD-10-CM | POA: Diagnosis not present

## 2022-12-04 DIAGNOSIS — E46 Unspecified protein-calorie malnutrition: Secondary | ICD-10-CM | POA: Diagnosis not present

## 2022-12-10 ENCOUNTER — Other Ambulatory Visit (HOSPITAL_COMMUNITY)
Admission: RE | Admit: 2022-12-10 | Discharge: 2022-12-10 | Disposition: A | Discharge status: Home / Self Care | Payer: Medicare HMO | Source: Ambulatory Visit | Attending: Cardiology | Admitting: Cardiology

## 2022-12-10 ENCOUNTER — Telehealth (HOSPITAL_COMMUNITY): Payer: Self-pay

## 2022-12-10 DIAGNOSIS — I5032 Chronic diastolic (congestive) heart failure: Secondary | ICD-10-CM

## 2022-12-10 LAB — BASIC METABOLIC PANEL
Anion gap: 6 (ref 5–15)
BUN: 23 mg/dL (ref 8–23)
CO2: 31 mmol/L (ref 22–32)
Calcium: 8.3 mg/dL — ABNORMAL LOW (ref 8.9–10.3)
Chloride: 94 mmol/L — ABNORMAL LOW (ref 98–111)
Creatinine, Ser: 1.8 mg/dL — ABNORMAL HIGH (ref 0.61–1.24)
GFR, Estimated: 42 mL/min — ABNORMAL LOW (ref >60)
Glucose, Bld: 179 mg/dL — ABNORMAL HIGH (ref 70–99)
Potassium: 3.7 mmol/L (ref 3.5–5.1)
Sodium: 131 mmol/L — ABNORMAL LOW (ref 135–145)

## 2022-12-10 NOTE — Telephone Encounter (Signed)
Patient wants to have his labs drawn at his next appointment and lab order is in. Pt aware, agreeable, and verbalized understanding.

## 2022-12-10 NOTE — Telephone Encounter (Signed)
-----   Message from Marca Ancona sent at 12/10/2022  4:13 PM EDT ----- Creatinine has trended up.  Would repeat BMET in 1 week to make sure it has stabilized.

## 2022-12-12 ENCOUNTER — Telehealth (HOSPITAL_COMMUNITY): Payer: Self-pay | Admitting: Cardiology

## 2022-12-12 MED ORDER — TORSEMIDE 20 MG PO TABS: 40.0000 mg | ORAL_TABLET | Freq: Two times a day (BID) | ORAL | 3 refills | Status: DC

## 2022-12-12 NOTE — Telephone Encounter (Signed)
Pt aware.

## 2022-12-12 NOTE — Telephone Encounter (Signed)
Patient called to report increase in urinary output since starting farxiga. Reports loss of bladder control while out running errands and urine output is severe.  Reports he will hold until followup 9/26 Would like to receive provider input

## 2022-12-12 NOTE — Telephone Encounter (Signed)
Keep the Marcelline Deist going, decrease torsemide to 40 mg bid.

## 2022-12-13 ENCOUNTER — Encounter: Payer: Self-pay | Admitting: Gastroenterology

## 2022-12-13 NOTE — Telephone Encounter (Signed)
Daniel Crawford with [facility] paramedicine aware of changes and meds ironed out Coreg 6.25 bid Restart farxiga 10 mg daily Decrease torsemide 40 bid Kcl 40 bid Spiro 100 daily   Weight 377 9/19 LLE 5+ edema pinkish in color-was advised to take torsemide-chf education provided-Daniel Crawford scheduled to return for recheck

## 2022-12-19 ENCOUNTER — Telehealth (HOSPITAL_COMMUNITY): Payer: Self-pay | Admitting: Cardiology

## 2022-12-19 NOTE — Telephone Encounter (Signed)
Patient reports constipation since starting farxiga  No relief with OTC laxative Reports his lactulose is not working any more  Concerns of BLE Concerns of increase in ammonia levels  Fu appt 9/26   Please advise should adjustments be made overnight until pt returns for follow up

## 2022-12-19 NOTE — Telephone Encounter (Signed)
Pt aware.

## 2022-12-20 ENCOUNTER — Emergency Department (HOSPITAL_COMMUNITY): Payer: Medicare HMO

## 2022-12-20 ENCOUNTER — Other Ambulatory Visit: Payer: Self-pay

## 2022-12-20 ENCOUNTER — Ambulatory Visit (HOSPITAL_BASED_OUTPATIENT_CLINIC_OR_DEPARTMENT_OTHER)
Admission: RE | Admit: 2022-12-20 | Discharge: 2022-12-20 | Disposition: A | Discharge status: Home / Self Care | Payer: Medicare HMO | Source: Ambulatory Visit | Attending: Physician Assistant | Admitting: Physician Assistant

## 2022-12-20 ENCOUNTER — Inpatient Hospital Stay (HOSPITAL_COMMUNITY)
Admission: EM | Admit: 2022-12-20 | Discharge: 2022-12-25 | DRG: 291 | Disposition: A | Discharge status: Home Health | Payer: Medicare HMO | Attending: Family Medicine | Admitting: Family Medicine

## 2022-12-20 ENCOUNTER — Encounter (HOSPITAL_COMMUNITY): Payer: Self-pay

## 2022-12-20 ENCOUNTER — Encounter (HOSPITAL_COMMUNITY): Payer: Self-pay | Admitting: *Deleted

## 2022-12-20 VITALS — BP 140/66 | HR 86 | Wt 381.6 lb

## 2022-12-20 DIAGNOSIS — J449 Chronic obstructive pulmonary disease, unspecified: Secondary | ICD-10-CM | POA: Insufficient documentation

## 2022-12-20 DIAGNOSIS — N1831 Chronic kidney disease, stage 3a: Secondary | ICD-10-CM | POA: Diagnosis present

## 2022-12-20 DIAGNOSIS — K219 Gastro-esophageal reflux disease without esophagitis: Secondary | ICD-10-CM | POA: Diagnosis present

## 2022-12-20 DIAGNOSIS — I440 Atrioventricular block, first degree: Secondary | ICD-10-CM | POA: Diagnosis present

## 2022-12-20 DIAGNOSIS — I5032 Chronic diastolic (congestive) heart failure: Secondary | ICD-10-CM | POA: Diagnosis not present

## 2022-12-20 DIAGNOSIS — D696 Thrombocytopenia, unspecified: Secondary | ICD-10-CM | POA: Diagnosis present

## 2022-12-20 DIAGNOSIS — R9431 Abnormal electrocardiogram [ECG] [EKG]: Secondary | ICD-10-CM | POA: Insufficient documentation

## 2022-12-20 DIAGNOSIS — I130 Hypertensive heart and chronic kidney disease with heart failure and stage 1 through stage 4 chronic kidney disease, or unspecified chronic kidney disease: Secondary | ICD-10-CM | POA: Insufficient documentation

## 2022-12-20 DIAGNOSIS — D631 Anemia in chronic kidney disease: Secondary | ICD-10-CM | POA: Diagnosis present

## 2022-12-20 DIAGNOSIS — F419 Anxiety disorder, unspecified: Secondary | ICD-10-CM | POA: Diagnosis present

## 2022-12-20 DIAGNOSIS — J4489 Other specified chronic obstructive pulmonary disease: Secondary | ICD-10-CM | POA: Diagnosis not present

## 2022-12-20 DIAGNOSIS — D72819 Decreased white blood cell count, unspecified: Secondary | ICD-10-CM | POA: Diagnosis not present

## 2022-12-20 DIAGNOSIS — M7989 Other specified soft tissue disorders: Secondary | ICD-10-CM | POA: Diagnosis not present

## 2022-12-20 DIAGNOSIS — R188 Other ascites: Secondary | ICD-10-CM | POA: Diagnosis present

## 2022-12-20 DIAGNOSIS — Z800 Family history of malignant neoplasm of digestive organs: Secondary | ICD-10-CM

## 2022-12-20 DIAGNOSIS — L02416 Cutaneous abscess of left lower limb: Secondary | ICD-10-CM | POA: Diagnosis present

## 2022-12-20 DIAGNOSIS — L03116 Cellulitis of left lower limb: Secondary | ICD-10-CM | POA: Diagnosis not present

## 2022-12-20 DIAGNOSIS — Z9049 Acquired absence of other specified parts of digestive tract: Secondary | ICD-10-CM

## 2022-12-20 DIAGNOSIS — K7581 Nonalcoholic steatohepatitis (NASH): Secondary | ICD-10-CM | POA: Diagnosis present

## 2022-12-20 DIAGNOSIS — E1140 Type 2 diabetes mellitus with diabetic neuropathy, unspecified: Secondary | ICD-10-CM | POA: Diagnosis not present

## 2022-12-20 DIAGNOSIS — K7460 Unspecified cirrhosis of liver: Secondary | ICD-10-CM | POA: Diagnosis not present

## 2022-12-20 DIAGNOSIS — D6959 Other secondary thrombocytopenia: Secondary | ICD-10-CM | POA: Diagnosis present

## 2022-12-20 DIAGNOSIS — I472 Ventricular tachycardia, unspecified: Secondary | ICD-10-CM | POA: Diagnosis not present

## 2022-12-20 DIAGNOSIS — R161 Splenomegaly, not elsewhere classified: Secondary | ICD-10-CM | POA: Diagnosis present

## 2022-12-20 DIAGNOSIS — E039 Hypothyroidism, unspecified: Secondary | ICD-10-CM | POA: Diagnosis not present

## 2022-12-20 DIAGNOSIS — L0390 Cellulitis, unspecified: Secondary | ICD-10-CM

## 2022-12-20 DIAGNOSIS — G4733 Obstructive sleep apnea (adult) (pediatric): Secondary | ICD-10-CM | POA: Diagnosis not present

## 2022-12-20 DIAGNOSIS — Z23 Encounter for immunization: Secondary | ICD-10-CM

## 2022-12-20 DIAGNOSIS — D61818 Other pancytopenia: Secondary | ICD-10-CM | POA: Diagnosis present

## 2022-12-20 DIAGNOSIS — F418 Other specified anxiety disorders: Secondary | ICD-10-CM | POA: Diagnosis not present

## 2022-12-20 DIAGNOSIS — L03115 Cellulitis of right lower limb: Secondary | ICD-10-CM | POA: Diagnosis not present

## 2022-12-20 DIAGNOSIS — I10 Essential (primary) hypertension: Secondary | ICD-10-CM | POA: Diagnosis present

## 2022-12-20 DIAGNOSIS — I5033 Acute on chronic diastolic (congestive) heart failure: Secondary | ICD-10-CM | POA: Insufficient documentation

## 2022-12-20 DIAGNOSIS — E1122 Type 2 diabetes mellitus with diabetic chronic kidney disease: Secondary | ICD-10-CM | POA: Diagnosis not present

## 2022-12-20 DIAGNOSIS — Z8249 Family history of ischemic heart disease and other diseases of the circulatory system: Secondary | ICD-10-CM

## 2022-12-20 DIAGNOSIS — Z87891 Personal history of nicotine dependence: Secondary | ICD-10-CM | POA: Insufficient documentation

## 2022-12-20 DIAGNOSIS — Z79899 Other long term (current) drug therapy: Secondary | ICD-10-CM

## 2022-12-20 DIAGNOSIS — N183 Chronic kidney disease, stage 3 unspecified: Secondary | ICD-10-CM | POA: Diagnosis present

## 2022-12-20 DIAGNOSIS — F32A Depression, unspecified: Secondary | ICD-10-CM | POA: Diagnosis present

## 2022-12-20 DIAGNOSIS — Z79890 Hormone replacement therapy (postmenopausal): Secondary | ICD-10-CM

## 2022-12-20 DIAGNOSIS — I509 Heart failure, unspecified: Secondary | ICD-10-CM

## 2022-12-20 DIAGNOSIS — Z6841 Body Mass Index (BMI) 40.0 and over, adult: Secondary | ICD-10-CM

## 2022-12-20 DIAGNOSIS — R601 Generalized edema: Principal | ICD-10-CM

## 2022-12-20 DIAGNOSIS — K7469 Other cirrhosis of liver: Secondary | ICD-10-CM | POA: Insufficient documentation

## 2022-12-20 DIAGNOSIS — R0989 Other specified symptoms and signs involving the circulatory and respiratory systems: Secondary | ICD-10-CM | POA: Diagnosis not present

## 2022-12-20 DIAGNOSIS — I272 Pulmonary hypertension, unspecified: Secondary | ICD-10-CM | POA: Diagnosis present

## 2022-12-20 DIAGNOSIS — E8809 Other disorders of plasma-protein metabolism, not elsewhere classified: Secondary | ICD-10-CM | POA: Diagnosis not present

## 2022-12-20 DIAGNOSIS — Z91199 Patient's noncompliance with other medical treatment and regimen due to unspecified reason: Secondary | ICD-10-CM

## 2022-12-20 LAB — BRAIN NATRIURETIC PEPTIDE
B Natriuretic Peptide: 50.8 pg/mL (ref 0.0–100.0)
B Natriuretic Peptide: 56 pg/mL (ref 0.0–100.0)

## 2022-12-20 LAB — CBC WITH DIFFERENTIAL/PLATELET
Abs Immature Granulocytes: 0.01 10*3/uL (ref 0.00–0.07)
Basophils Absolute: 0 10*3/uL (ref 0.0–0.1)
Basophils Relative: 1 %
Eosinophils Absolute: 0.3 10*3/uL (ref 0.0–0.5)
Eosinophils Relative: 8 %
HCT: 30.6 % — ABNORMAL LOW (ref 39.0–52.0)
Hemoglobin: 9.7 g/dL — ABNORMAL LOW (ref 13.0–17.0)
Immature Granulocytes: 0 %
Lymphocytes Relative: 21 %
Lymphs Abs: 0.7 10*3/uL (ref 0.7–4.0)
MCH: 29 pg (ref 26.0–34.0)
MCHC: 31.7 g/dL (ref 30.0–36.0)
MCV: 91.6 fL (ref 80.0–100.0)
Monocytes Absolute: 0.3 10*3/uL (ref 0.1–1.0)
Monocytes Relative: 9 %
Neutro Abs: 2.2 10*3/uL (ref 1.7–7.7)
Neutrophils Relative %: 61 %
Platelets: 66 10*3/uL — ABNORMAL LOW (ref 150–400)
RBC: 3.34 MIL/uL — ABNORMAL LOW (ref 4.22–5.81)
RDW: 15.8 % — ABNORMAL HIGH (ref 11.5–15.5)
WBC: 3.5 10*3/uL — ABNORMAL LOW (ref 4.0–10.5)
nRBC: 0 % (ref 0.0–0.2)

## 2022-12-20 LAB — CBC
HCT: 29.7 % — ABNORMAL LOW (ref 39.0–52.0)
Hemoglobin: 9.8 g/dL — ABNORMAL LOW (ref 13.0–17.0)
MCH: 29.6 pg (ref 26.0–34.0)
MCHC: 33 g/dL (ref 30.0–36.0)
MCV: 89.7 fL (ref 80.0–100.0)
Platelets: 63 10*3/uL — ABNORMAL LOW (ref 150–400)
RBC: 3.31 MIL/uL — ABNORMAL LOW (ref 4.22–5.81)
RDW: 15.6 % — ABNORMAL HIGH (ref 11.5–15.5)
WBC: 3.8 10*3/uL — ABNORMAL LOW (ref 4.0–10.5)
nRBC: 0 % (ref 0.0–0.2)

## 2022-12-20 LAB — BASIC METABOLIC PANEL
Anion gap: 7 (ref 5–15)
Anion gap: 8 (ref 5–15)
BUN: 32 mg/dL — ABNORMAL HIGH (ref 8–23)
BUN: 34 mg/dL — ABNORMAL HIGH (ref 8–23)
CO2: 29 mmol/L (ref 22–32)
CO2: 31 mmol/L (ref 22–32)
Calcium: 8.8 mg/dL — ABNORMAL LOW (ref 8.9–10.3)
Calcium: 9.1 mg/dL (ref 8.9–10.3)
Chloride: 96 mmol/L — ABNORMAL LOW (ref 98–111)
Chloride: 95 mmol/L — ABNORMAL LOW (ref 98–111)
Creatinine, Ser: 2.04 mg/dL — ABNORMAL HIGH (ref 0.61–1.24)
Creatinine, Ser: 1.92 mg/dL — ABNORMAL HIGH (ref 0.61–1.24)
GFR, Estimated: 36 mL/min — ABNORMAL LOW (ref >60)
GFR, Estimated: 38 mL/min — ABNORMAL LOW (ref >60)
Glucose, Bld: 150 mg/dL — ABNORMAL HIGH (ref 70–99)
Glucose, Bld: 209 mg/dL — ABNORMAL HIGH (ref 70–99)
Potassium: 4.4 mmol/L (ref 3.5–5.1)
Potassium: 4.2 mmol/L (ref 3.5–5.1)
Sodium: 132 mmol/L — ABNORMAL LOW (ref 135–145)
Sodium: 134 mmol/L — ABNORMAL LOW (ref 135–145)

## 2022-12-20 LAB — HEPATIC FUNCTION PANEL
ALT: 26 U/L (ref 0–44)
AST: 52 U/L — ABNORMAL HIGH (ref 15–41)
Albumin: 3.1 g/dL — ABNORMAL LOW (ref 3.5–5.0)
Alkaline Phosphatase: 81 U/L (ref 38–126)
Bilirubin, Direct: 0.9 mg/dL — ABNORMAL HIGH (ref 0.0–0.2)
Indirect Bilirubin: 2.6 mg/dL — ABNORMAL HIGH (ref 0.3–0.9)
Total Bilirubin: 3.5 mg/dL — ABNORMAL HIGH (ref 0.3–1.2)
Total Protein: 6.8 g/dL (ref 6.5–8.1)

## 2022-12-20 LAB — AMMONIA: Ammonia: 47 umol/L — ABNORMAL HIGH (ref 9–35)

## 2022-12-20 MED ORDER — LEVOTHYROXINE SODIUM 75 MCG PO TABS
175.0000 ug | ORAL_TABLET | Freq: Every day | ORAL | Status: DC
  Administered 2022-12-21 – 2022-12-25 (×5): 175 ug via ORAL
  Filled 2022-12-20 (×5): qty 1

## 2022-12-20 MED ORDER — TRAZODONE HCL 50 MG PO TABS
50.0000 mg | ORAL_TABLET | Freq: Every day | ORAL | Status: DC
  Administered 2022-12-20 – 2022-12-24 (×5): 50 mg via ORAL
  Filled 2022-12-20 (×5): qty 1

## 2022-12-20 MED ORDER — HYDROXYZINE HCL 25 MG PO TABS
50.0000 mg | ORAL_TABLET | Freq: Three times a day (TID) | ORAL | Status: DC | PRN
  Administered 2022-12-20 – 2022-12-23 (×3): 50 mg via ORAL
  Filled 2022-12-20 (×4): qty 2

## 2022-12-20 MED ORDER — CARVEDILOL 3.125 MG PO TABS
6.2500 mg | ORAL_TABLET | Freq: Two times a day (BID) | ORAL | Status: DC
  Administered 2022-12-21 – 2022-12-25 (×9): 6.25 mg via ORAL
  Filled 2022-12-20 (×9): qty 2

## 2022-12-20 MED ORDER — GABAPENTIN 100 MG PO CAPS
200.0000 mg | ORAL_CAPSULE | Freq: Every day | ORAL | Status: DC
  Administered 2022-12-20 – 2022-12-24 (×5): 200 mg via ORAL
  Filled 2022-12-20 (×5): qty 2

## 2022-12-20 MED ORDER — DOXYCYCLINE HYCLATE 100 MG PO TABS
100.0000 mg | ORAL_TABLET | Freq: Two times a day (BID) | ORAL | Status: DC
  Administered 2022-12-20 – 2022-12-25 (×10): 100 mg via ORAL
  Filled 2022-12-20 (×10): qty 1

## 2022-12-20 MED ORDER — PANTOPRAZOLE SODIUM 40 MG PO TBEC
40.0000 mg | DELAYED_RELEASE_TABLET | Freq: Every day | ORAL | Status: DC
  Administered 2022-12-21 – 2022-12-25 (×5): 40 mg via ORAL
  Filled 2022-12-20 (×5): qty 1

## 2022-12-20 MED ORDER — LACTULOSE 10 GM/15ML PO SOLN
20.0000 g | Freq: Two times a day (BID) | ORAL | Status: DC
  Administered 2022-12-20 – 2022-12-25 (×10): 20 g via ORAL
  Filled 2022-12-20 (×10): qty 30

## 2022-12-20 MED ORDER — POTASSIUM CHLORIDE CRYS ER 20 MEQ PO TBCR
40.0000 meq | EXTENDED_RELEASE_TABLET | Freq: Two times a day (BID) | ORAL | Status: DC
  Administered 2022-12-20 – 2022-12-25 (×10): 40 meq via ORAL
  Filled 2022-12-20 (×10): qty 2

## 2022-12-20 MED ORDER — SERTRALINE HCL 50 MG PO TABS
100.0000 mg | ORAL_TABLET | Freq: Every day | ORAL | Status: DC
  Administered 2022-12-21: 100 mg via ORAL
  Administered 2022-12-22: 50 mg via ORAL
  Administered 2022-12-23 – 2022-12-25 (×3): 100 mg via ORAL
  Filled 2022-12-20 (×5): qty 2

## 2022-12-20 MED ORDER — FUROSEMIDE 10 MG/ML IJ SOLN
60.0000 mg | Freq: Once | INTRAMUSCULAR | Status: AC
  Administered 2022-12-20: 60 mg via INTRAVENOUS
  Filled 2022-12-20: qty 6

## 2022-12-20 MED ORDER — INSULIN ASPART 100 UNIT/ML IJ SOLN
0.0000 [IU] | Freq: Three times a day (TID) | INTRAMUSCULAR | Status: DC
  Administered 2022-12-21 – 2022-12-24 (×8): 1 [IU] via SUBCUTANEOUS

## 2022-12-20 MED ORDER — SPIRONOLACTONE 25 MG PO TABS
100.0000 mg | ORAL_TABLET | Freq: Every day | ORAL | Status: DC
  Administered 2022-12-21 – 2022-12-25 (×5): 100 mg via ORAL
  Filled 2022-12-20 (×5): qty 4

## 2022-12-20 MED ORDER — FUROSEMIDE 10 MG/ML IJ SOLN
80.0000 mg | Freq: Two times a day (BID) | INTRAMUSCULAR | Status: DC
  Administered 2022-12-21: 80 mg via INTRAVENOUS
  Filled 2022-12-20: qty 8

## 2022-12-20 MED ORDER — DAPAGLIFLOZIN PROPANEDIOL 10 MG PO TABS
10.0000 mg | ORAL_TABLET | Freq: Every day | ORAL | Status: DC
  Administered 2022-12-21 – 2022-12-25 (×5): 10 mg via ORAL
  Filled 2022-12-20 (×6): qty 1

## 2022-12-20 MED ORDER — POLYETHYLENE GLYCOL 3350 17 G PO PACK
17.0000 g | PACK | Freq: Every day | ORAL | Status: DC
  Administered 2022-12-22 – 2022-12-24 (×3): 17 g via ORAL
  Filled 2022-12-20 (×5): qty 1

## 2022-12-20 MED ORDER — FUROSEMIDE 10 MG/ML IJ SOLN
8.0000 mg/h | INTRAVENOUS | Status: DC
  Filled 2022-12-20: qty 20

## 2022-12-20 MED ORDER — RIFAXIMIN 550 MG PO TABS
550.0000 mg | ORAL_TABLET | Freq: Two times a day (BID) | ORAL | Status: DC
  Administered 2022-12-20 – 2022-12-25 (×10): 550 mg via ORAL
  Filled 2022-12-20 (×10): qty 1

## 2022-12-20 NOTE — ED Triage Notes (Signed)
Pt c/o increased swelling to bilateral lower legs; pt was seen at his cardiologist this am who suggested he be seen to have fluid "taken off"

## 2022-12-20 NOTE — ED Provider Notes (Signed)
[place] EMERGENCY DEPARTMENT AT [facility] Provider Note   CSN: [id number] Arrival date & time: 12/20/22  1324     History  Chief Complaint  Patient presents with   Leg Swelling    Sidhant Selkirk is a 67 y.o. male.  67 year old male with past medical history of Elita Boone and CHF presenting to the emergency department today with lower extremity swelling and shortness of breath.  The patient states has been going now for the past few weeks.  Reports that his shortness of breath is not terribly different than his baseline.  He states that he has been taking 60 mg of torsemide per day.  He was taking 120 mg for a while but this was cut back a few weeks ago.  He states that his legs have been swollen more.  He is having difficulty ambulating due to this.  He followed up with cardiology today.  They discussed inpatient versus outpatient treatment options and ultimately the patient decided he would rather do this inpatient.  He denies any associated chest pain.  He reports that he is having some erythema over the left side of his leg.  He is concerned for recurrent cellulitis.        Home Medications Prior to Admission medications   Medication Sig Start Date End Date Taking? Authorizing Provider  acetaminophen (TYLENOL) 500 MG tablet Take 1,000 mg by mouth every 6 (six) hours as needed for moderate pain.    [provider]  albuterol (VENTOLIN HFA) 108 (90 Base) MCG/ACT inhaler Inhale 2 puffs into the lungs every 6 (six) hours as needed for wheezing or shortness of breath. 05/01/22   Oretha Milch, MD  carvedilol (COREG) 6.25 MG tablet Take 6.25 mg by mouth 2 (two) times daily with a meal.    [provider]  dapagliflozin propanediol (FARXIGA) 10 MG TABS tablet Take 1 tablet (10 mg total) by mouth daily before breakfast. 11/28/22   Laurey Morale, MD  gabapentin (NEURONTIN) 100 MG capsule Take 100 mg by mouth daily as needed (pain). 02/20/21   [provider]   hydrOXYzine (VISTARIL) 50 MG capsule Take 1 capsule (50 mg total) by mouth 3 (three) times daily as needed for anxiety or nausea. 01/25/22   Shon Hale, MD  lactose free nutrition (BOOST) LIQD Take 237 mLs by mouth every evening.    [provider]  lactulose (CHRONULAC) 10 GM/15ML solution Take 30 mLs (20 g total) by mouth 2 (two) times daily. 10/26/22 01/24/23  Vassie Loll, MD  levothyroxine (SYNTHROID) 175 MCG tablet Take 175 mcg by mouth daily before breakfast.    [provider]  ondansetron (ZOFRAN) 8 MG tablet Take by mouth every 8 (eight) hours as needed for nausea or vomiting.    [provider]  pantoprazole (PROTONIX) 40 MG tablet Take 1 tablet (40 mg total) by mouth daily. 01/25/22   Shon Hale, MD  potassium chloride SA (KLOR-CON M) 20 MEQ tablet Take 2 tablets (40 mEq total) by mouth 2 (two) times daily. 07/25/22   Laurey Morale, MD  rifaximin (XIFAXAN) 550 MG TABS tablet Take 1 tablet (550 mg total) by mouth 2 (two) times daily. 01/25/22   Shon Hale, MD  sertraline (ZOLOFT) 100 MG tablet Take 100 mg by mouth daily. 09/24/22   [provider]  spironolactone (ALDACTONE) 100 MG tablet Take 100 mg by mouth daily.    [provider]  torsemide (DEMADEX) 20 MG tablet Take 2 tablets (40  mg total) by mouth 2 (two) times daily. 12/12/22   Laurey Morale, MD  traZODone (DESYREL) 50 MG tablet Take 1 tablet (50 mg total) by mouth at bedtime. 01/25/22   Shon Hale, MD      Allergies    Patient has no known allergies.    Review of Systems   Review of Systems  Respiratory:  Positive for shortness of breath.   Cardiovascular:  Positive for leg swelling.  All other systems reviewed and are negative.   Physical Exam Updated Vital Signs BP (!) 146/68   Pulse 73   Temp 98.5 F (36.9 C) (Oral)   Resp 15   Ht 6\' 1"  (1.854 m)   Wt (!) 173 kg   SpO2 97%   BMI 50.32 kg/m  Physical Exam Vitals and nursing note reviewed.    Gen: NAD, mild conversational dyspnea noted Eyes: PERRL, EOMI HEENT: no oropharyngeal swelling Neck: trachea midline Resp: Diminished at bilateral lung bases Card: RRR, no murmurs, rubs, or gallops Abd: nontender, nondistended Extremities: 3+ pitting edema bilaterally, there is some mild erythema noted around an open scab on the left leg with no purulent drainage.  Is mildly indurated. Vascular: 2+ radial pulses bilaterally, 2+ DP pulses bilaterally Skin: no rashes Psyc: acting appropriately   ED Results / Procedures / Treatments   Labs (all labs ordered are listed, but only abnormal results are displayed) Labs Reviewed  CBC WITH DIFFERENTIAL/PLATELET - Abnormal; Notable for the following components:      Result Value   WBC 3.5 (*)    RBC 3.34 (*)    Hemoglobin 9.7 (*)    HCT 30.6 (*)    RDW 15.8 (*)    Platelets 66 (*)    All other components within normal limits  BASIC METABOLIC PANEL - Abnormal; Notable for the following components:   Sodium 134 (*)    Chloride 95 (*)    Glucose, Bld 209 (*)    BUN 34 (*)    Creatinine, Ser 1.92 (*)    GFR, Estimated 38 (*)    All other components within normal limits  AMMONIA - Abnormal; Notable for the following components:   Ammonia 47 (*)    All other components within normal limits  HEPATIC FUNCTION PANEL - Abnormal; Notable for the following components:   Albumin 3.1 (*)    AST 52 (*)    Total Bilirubin 3.5 (*)    Bilirubin, Direct 0.9 (*)    Indirect Bilirubin 2.6 (*)    All other components within normal limits  BRAIN NATRIURETIC PEPTIDE    EKG None  Radiology DG Chest 2 View  Result Date: 12/20/2022 CLINICAL DATA:  Worsening bilateral lower extremity swelling. EXAM: CHEST - 2 VIEW COMPARISON:  10/19/2022 FINDINGS: Cardiopericardial silhouette is at upper limits of normal for size. The cardiopericardial silhouette is within normal limits for size. Mild vascular congestion without edema. No pleural effusion. No acute  bony abnormality. IMPRESSION: Mild vascular congestion without edema. Electronically Signed   By: Kennith Center M.D.   On: 12/20/2022 16:27    Procedures Procedures    Medications Ordered in ED Medications  furosemide (LASIX) injection 60 mg (has no administration in time range)    ED Course/ Medical Decision Making/ A&P                                 Medical Decision Making 67 year old male with past medical  history of Elita Boone and CHF presenting to the emergency department today.  The patient is clearly volume overloaded.  He does appear that he is developing cellulitis of the left lower extremity.  I will cover the patient with vancomycin.  Will obtain basic lab as well as chest x-ray to evaluate for CHF.  I will give patient Lasix.  He will require admission.  The patient's chest x-ray does show some cardiomegaly but no florid overload.  His creatinine is relatively stable.  He is given 60 mg of IV Lasix.  Calls placed to hospitalist service for admission.  Amount and/or Complexity of Data Reviewed Labs: ordered. Radiology: ordered.  Risk Prescription drug management.           Final Clinical Impression(s) / ED Diagnoses Final diagnoses:  Anasarca  Cellulitis, unspecified cellulitis site    Rx / DC Orders ED Discharge Orders     None         Durwin Glaze, MD 12/20/22 2106

## 2022-12-20 NOTE — H&P (Addendum)
TR[facility] H&P   Patient Demographics:    Paton Shampine, is a 67 y.o. male  MRN: [id number]   DOB - 1956/03/19  Admit Date - 12/20/2022  Outpatient Primary MD for the patient is [name], MD  Referring MD/NP/PA: Dr. Rhae Hammock  Outpatient Specialists:  cardiology Dr. Shirlee Latch  Patient coming from: CHF clinic in [facility]  Chief Complaint  Patient presents with   Leg Swelling      HPI:    Jacion Montecalvo  is a 67 y.o. male, Bevin Brouse is a 67 y.o. male with medical history significant of cirrhosis secondary to NASH, diabetes stage IIIb chronic kidney disease, diastolic heart failure, hypertension, hypothyroidism, asthma.   -Patient was sent from CHF clinic earlier today for admission for diuresis, reports significant weight gain, worsening lower extremity edema, anasarca over last few weeks, has been followed by CHF team, with his medication been adjusted frequently, he presents today CHF clinic given worsening edema, and difficulty ambulating due to that, he was recommended by cardiology CHF team Dr. Sherlie Ban for [facility] admission, patient wanted to come to any pain hospital given he lives in [place].   To go to [facility] admission, but patient reports he wants any pain given he lives in [place], patient denies any cough, chest pain or hemoptysis. -ED his workup significant for sodium of 134, glucose of 209, creatinine of 1.92, ammonia of 47, total bilirubin of 3.5, platelet count is low at 66, which is his baseline, chest x-ray significant for vascular congestion, [facility] hospitalist consulted to admit.   Review of systems:      A full 10 point Review of Systems was done, except as stated above, all other Review of Systems were negative.   With Past History of the following :    Past Medical History:  Diagnosis Date   Anemia    Anxiety    Asthma    CHF (congestive heart failure) (HCC)    CKD  (chronic kidney disease)    Depression    Diabetes mellitus without complication (HCC)    Dyspnea    Hypertension    Hypothyroidism    Liver cirrhosis secondary to NASH (HCC)    NASH (nonalcoholic steatohepatitis)    Pre-diabetes    Spleen enlarged    Thrombocytopenia (HCC)       Past Surgical History:  Procedure Laterality Date   Bilateral hernia surgery     2006, 2007   CHOLECYSTECTOMY     2016    COLONOSCOPY WITH PROPOFOL N/A 06/28/2021   Procedure: COLONOSCOPY WITH PROPOFOL;  Surgeon: Malissa Hippo, MD;  [facility];  Service: Endoscopy;  Laterality: N/A;  1020   ESOPHAGEAL DILATION N/A 11/06/2019   Procedure: ESOPHAGEAL DILATION;  Surgeon: Dolores Frame, MD;  Location: [facility];  Service: Gastroenterology;  Laterality: N/A;   ESOPHAGOGASTRODUODENOSCOPY (EGD) WITH PROPOFOL N/A 11/06/2019  Procedure: ESOPHAGOGASTRODUODENOSCOPY (EGD) WITH PROPOFOL;  Surgeon: Dolores Frame, MD;  Location: [facility];  Service: Gastroenterology;  Laterality: N/A;  1045   IR RADIOLOGIST EVAL & MGMT  02/19/2022   LAPAROSCOPIC ASSISTED VENTRAL HERNIA REPAIR     Polyp removed     in January of 2018.    POLYPECTOMY  06/28/2021   Procedure: POLYPECTOMY INTESTINAL;  Surgeon: Malissa Hippo, MD;  Location: [facility];  Service: Endoscopy;;   RIGHT HEART CATH N/A 01/08/2022   Procedure: RIGHT HEART CATH;  Surgeon: Corky Crafts, MD;  Location: [facility] INVASIVE CV LAB;  Service: Cardiovascular;  Laterality: N/A;      Social History:     Social History   Tobacco Use   Smoking status: Former    Current packs/day: 0.00    Average packs/day: 1 pack/day for 20.0 years (20.0 ttl pk-yrs)    Types: Cigarettes    Start date: 33    Quit date: 2017    Years since quitting: 7.7    Passive exposure: Current   Smokeless tobacco: Never  Substance Use Topics   Alcohol use: No       Family History :     Family History  Problem Relation Age of Onset    Liver cancer Mother    Heart disease Mother    Aneurysm Sister      Home Medications:   Prior to Admission medications   Medication Sig Start Date End Date Taking? Authorizing Provider  acetaminophen (TYLENOL) 500 MG tablet Take 1,000 mg by mouth every 6 (six) hours as needed for moderate pain.   Yes [provider]  albuterol (VENTOLIN HFA) 108 (90 Base) MCG/ACT inhaler Inhale 2 puffs into the lungs every 6 (six) hours as needed for wheezing or shortness of breath. 05/01/22  Yes Oretha Milch, MD  carvedilol (COREG) 6.25 MG tablet Take 6.25 mg by mouth 2 (two) times daily with a meal.   Yes [provider]  dapagliflozin propanediol (FARXIGA) 10 MG TABS tablet Take 1 tablet (10 mg total) by mouth daily before breakfast. 11/28/22  Yes Laurey Morale, MD  gabapentin (NEURONTIN) 100 MG capsule Take 100 mg by mouth daily as needed (pain). 02/20/21  Yes [provider]  hydrOXYzine (VISTARIL) 50 MG capsule Take 1 capsule (50 mg total) by mouth 3 (three) times daily as needed for anxiety or nausea. 01/25/22  Yes Emokpae, Courage, MD  lactose free nutrition (BOOST) LIQD Take 237 mLs by mouth every evening.   Yes [provider]  lactulose (CHRONULAC) 10 GM/15ML solution Take 30 mLs (20 g total) by mouth 2 (two) times daily. 10/26/22 01/24/23 Yes Vassie Loll, MD  levothyroxine (SYNTHROID) 175 MCG tablet Take 175 mcg by mouth daily before breakfast.   Yes [provider]  ondansetron (ZOFRAN) 4 MG tablet Take 4 mg by mouth 2 (two) times daily as needed. 10/28/22  Yes [provider]  pantoprazole (PROTONIX) 40 MG tablet Take 1 tablet (40 mg total) by mouth daily. 01/25/22  Yes Emokpae, Courage, MD  polyethylene glycol (MIRALAX / GLYCOLAX) 17 g packet Take 17 g by mouth daily.   Yes [provider]  potassium chloride SA (KLOR-CON M) 20 MEQ tablet Take 2 tablets (40 mEq total) by mouth 2 (two) times daily. 07/25/22  Yes Laurey Morale, MD   rifaximin (XIFAXAN) 550 MG TABS tablet Take 1 tablet (550 mg total) by mouth 2 (two) times daily. 01/25/22  Yes Shon Hale, MD  sertraline (ZOLOFT) 100 MG tablet Take 100 mg by mouth daily. 09/24/22  Yes [provider]  spironolactone (ALDACTONE) 100 MG tablet Take 100 mg by mouth daily.   Yes [provider]  torsemide (DEMADEX) 20 MG tablet Take 2 tablets (40 mg total) by mouth 2 (two) times daily. 12/12/22  Yes Laurey Morale, MD  traZODone (DESYREL) 50 MG tablet Take 1 tablet (50 mg total) by mouth at bedtime. 01/25/22  Yes Shon Hale, MD     Allergies:    No Known Allergies   Physical Exam:   Vitals  Blood pressure (!) 146/68, pulse 73, temperature 98.5 F (36.9 C), temperature source Oral, resp. rate 15, height 6\' 1"  (1.854 m), weight (!) 173 kg, SpO2 97%.   1. General Elderly male, laying in bed, with morbid obesity  2. Normal affect and insight, Not Suicidal or Homicidal, Awake Alert, Oriented X 3.  3. No F.N deficits, ALL C.Nerves Intact, Strength 5/5 all 4 extremities, Sensation intact all 4 extremities, Plantars down going.  4. Ears and Eyes appear Normal, Conjunctivae clear, PERRLA. Moist Oral Mucosa.  5. Supple Neck, No JVD, No cervical lymphadenopathy appriciated, No Carotid Bruits.  6. Symmetrical Chest wall movement, this send lung sounds due to body habitus  7. RRR, No Gallops, Rubs or Murmurs, No Parasternal Heave.  +3 edema  8. Positive Bowel Sounds, Abdomen Soft, No tenderness, No organomegaly appriciated,No rebound -guarding or rigidity.  9.  No Cyanosis, Normal Skin Turgor, small wound on the lateral side of left leg with surrounding erythema, see picture below  10. Good muscle tone,  joints appear normal , no effusions, Normal ROM.     Data Review:    CBC Recent Labs  Lab 12/20/22 1141 12/20/22 1418  WBC 3.8* 3.5*  HGB 9.8* 9.7*  HCT 29.7* 30.6*  PLT 63* 66*  MCV 89.7 91.6  MCH 29.6 29.0  MCHC 33.0 31.7  RDW  15.6* 15.8*  LYMPHSABS  --  0.7  MONOABS  --  0.3  EOSABS  --  0.3  BASOSABS  --  0.0   ------------------------------------------------------------------------------------------------------------------  Chemistries  Recent Labs  Lab 12/20/22 1141 12/20/22 1418  NA 132* 134*  K 4.4 4.2  CL 96* 95*  CO2 29 31  GLUCOSE 150* 209*  BUN 32* 34*  CREATININE 2.04* 1.92*  CALCIUM 8.8* 9.1  AST  --  52*  ALT  --  26  ALKPHOS  --  81  BILITOT  --  3.5*   ------------------------------------------------------------------------------------------------------------------ estimated creatinine clearance is 64.4 mL/min (A) (by C-G formula based on SCr of 1.92 mg/dL (H)). ------------------------------------------------------------------------------------------------------------------ No results for input(s): "TSH", "T4TOTAL", "T3FREE", "THYROIDAB" in the last 72 hours.  Invalid input(s): "FREET3"  Coagulation profile No results for input(s): "INR", "PROTIME" in the last 168 hours. ------------------------------------------------------------------------------------------------------------------- No results for input(s): "DDIMER" in the last 72 hours. -------------------------------------------------------------------------------------------------------------------  Cardiac Enzymes No results for input(s): "CKMB", "TROPONINI", "MYOGLOBIN" in the last 168 hours.  Invalid input(s): "CK" ------------------------------------------------------------------------------------------------------------------    Component Value Date/Time   BNP 56.0 12/20/2022 1418     ---------------------------------------------------------------------------------------------------------------  Urinalysis    Component Value Date/Time   COLORURINE YELLOW 10/20/2022 1045   APPEARANCEUR CLEAR 10/20/2022 1045   LABSPEC 1.008 10/20/2022 1045   PHURINE 7.0 10/20/2022 1045   GLUCOSEU NEGATIVE 10/20/2022 1045    HGBUR NEGATIVE 10/20/2022 1045   BILIRUBINUR NEGATIVE 10/20/2022 1045   KETONESUR NEGATIVE 10/20/2022 1045   PROTEINUR NEGATIVE 10/20/2022 1045   NITRITE NEGATIVE 10/20/2022 1045  LEUKOCYTESUR NEGATIVE 10/20/2022 1045    ----------------------------------------------------------------------------------------------------------------   Imaging Results:    DG Chest 2 View  Result Date: 12/20/2022 CLINICAL DATA:  Worsening bilateral lower extremity swelling. EXAM: CHEST - 2 VIEW COMPARISON:  10/19/2022 FINDINGS: Cardiopericardial silhouette is at upper limits of normal for size. The cardiopericardial silhouette is within normal limits for size. Mild vascular congestion without edema. No pleural effusion. No acute bony abnormality. IMPRESSION: Mild vascular congestion without edema. Electronically Signed   By: Kennith Center M.D.   On: 12/20/2022 16:27     EKG: Vent. rate 69 BPM PR interval 184 ms QRS duration 100 ms QT/QTcB 446/477 ms P-R-T axes -63 35 -1 Unusual P axis, possible ectopic atrial rhythm Cannot rule out Anterior infarct , age undetermined Abnormal ECG When compared with ECG of 28-Nov-2022 11:10, Ectopic atrial rhythm has replaced Sinus rhythm   Assessment & Plan:    Principal Problem:   Acute on chronic diastolic CHF (congestive heart failure) (HCC) Active Problems:   Cirrhosis of liver with ascites (HCC)   Essential hypertension   CKD stage 3a, GFR 45-59 ml/min (HCC)   Acquired hypothyroidism   Thrombocytopenia (HCC)   Depression with anxiety   Anasarca   GERD (gastroesophageal reflux disease)   Acute exacerbation of congestive heart failure (HCC)   NASH (nonalcoholic steatohepatitis)   Class 3 severe obesity in adult [facility])   Cellulitis and abscess of left leg   Acute on chronic diastolic CHF -Patient presents with evidence of massive volume overload, he will be admitted for further management, he will be started on IV Lasix 80 mg twice daily, likely will  need to be uptitrated, continue with Aldactone 100 mg oral daily, continue with daily weights, strict ins and out. -Monitor diuresis closely, likely will need to be augmented by metolazone as needed. -Request cardiology consult for assistance with management. -Continue with Marcelline Deist -Continue with scheduled potassium supplements, monitor labs closely  To consider upon discharge from [facility] per CHF team recommendation during today's visit by Dr Jearld Pies: " We will make followup here in 3 wks with him. When he is discharged from [facility], needs to be sent out on an adequate dose of diuretic, ideally torsemide 60 mg bid with metolazone 2.5 once or twice a week if renal function remains stable. "   NASH cirrhosis/Stage IV by liver biopsy, likely due to NASH History of hepatic encephalopathy/hyperammonemia Splenomegaly with thrombocytopenia -Continue to monitor LFTs closely -Continue with rifaximin and lactulose -Continue with diuresis and Aldactone  Thrombocytopenia -Due to liver cirrhosis and splenomegaly, chronic, stable at baseline  COPD -No wheezing, he is no longer smokes  OSA -Does not want to use CPAP  Neuropathy -Continue with gabapentin  GERD -Continue with PPI   Morbid obesity Body mass index is 50.32 kg/m.  Depression-anxiety -Continue with Zoloft and hydroxyzine  Started on p.o. doxycycline for small wound in the left leg with surrounding erythema   DVT Prophylaxis SCDs   AM Labs Ordered, also please review Full Orders  Family Communication: Admission, patients condition and plan of care including tests being ordered have been discussed with the patient who indicate understanding and agree with the plan and Code Status.  Code Status full code  Likely DC to home  Condition GUARDED  Consults called: Cardiology requested in epic  Admission status: Inpatient  Time spent in minutes : 75 minutes   Huey Bienenstock M.D on 12/20/2022 at 10:00  PM   [facility] - Office  [phone]

## 2022-12-20 NOTE — ED Provider Triage Note (Signed)
Emergency Medicine Provider Triage Evaluation Note  Daniel Crawford , a 67 y.o. male  was evaluated in triage.  Pt complains of weight gain and lower extremity swelling.  History of CHF and NASH, is on torsemide, states he had been doing 120 mg daily, recently was cut down to 60 daily but has had increased swelling and feels need to be admitted to the hospital, sent from cardiology office.  He only has breath on exertion.  No chest pain or fevers.  Review of Systems  Positive: Lower extremity swelling, dyspnea on exertion Negative: Chest pain  Physical Exam  BP (!) 138/54 (BP Location: Right Arm)   Pulse 78   Temp 98.5 F (36.9 C) (Oral)   Resp 20   Ht 6\' 1"  (1.854 m)   Wt (!) 173 kg   SpO2 100%   BMI 50.32 kg/m  Gen:   Awake, no distress   Resp:  Normal effort  MSK:   Moves extremities without difficulty  Other:  bilateral lower extremity edema  Medical Decision Making  Medically screening exam initiated at 3:41 PM.  Appropriate orders placed.  Cassidy Cheatum was informed that the remainder of the evaluation will be completed by another provider, this initial triage assessment does not replace that evaluation, and the importance of remaining in the ED until their evaluation is complete.     Ma Rings, [place] 12/20/22 1544

## 2022-12-20 NOTE — Progress Notes (Addendum)
PCP: [name], MD Cardiology: Dr. Diona Browner HF Cardiology: Dr. Shirlee Latch  67 y.o. with history of NASH cirrhosis, chronic diastolic CHF, DM2, CKD stage 3, and COPD was referred by Dr. Diona Browner for evaluation of CHF.  Patient has cirrhosis due to NASH with splenomegaly, thrombocytopenia, and h/o hepatic encephalopathy.  Liver biopsy in 1/23 showed stage IV cirrhosis.  Patient has struggled with volume overload.  He was followed in the heart failure program at [facility] before moving to [place].  RHC in 10/23 showed mildly elevated filling pressures with pulmonary venous hypertension.  Echo in 4/24 showed EF 55%, mild LVH, normal RV systolic function with mild RV enlargement.  He has had several recent admissions with CHF/anasarca and hepatic encephalopathy.  He has never had enough ascites for paracentesis.  He was admitted twice in 7/24 for CHF and diuresed.  Cardiology was not consulted.  For reasons that remain obscure, he was taken off torsemide and put back on Lasix.   Seen for follow-up on 11/28/22. Weight up about 20 lb after Torsemide switched to lasix. He was restarted on torsemide at 60 mg BID. Also started [place]. Referred to Paramedicine. Called on 09/18 to report very brisk urine output resulting in incontinence. Torsemide cut back to 40 mg BID.  Here today for follow-up. Feels terrible. Legs are very swollen and painful. Weight up a pound from last visit. Notes shortness of breath with exertion and chronic orthopnea. He is concerned Marcelline Deist is making him constipated and wants to stop it. He is actually taking just 60 mg Torsemide daily instead of 40 BID. Plans to go to the ED at [facility] after today's visit, wants to be admitted for IV diuresis.   ECG (personally reviewed):   Labs (4/24): NH3 74, K 3.7, creatinine 1.31, BNP 47 Labs (5/24): K 4.1, creatinine 1.67 Labs (8/24): K 3.9, creatinine 1.13, hgb 8.5, plts 61  PMH: 1. Cirrhosis: Due to NASH.  Splenomegaly, chronic  thrombocytopenia, history of hepatic encephalopathy.  - Liver biopsy in 1/23 with stage IV cirrhosis.  2. Thrombocytopenia: Due to splenomegaly.  3. Hypothyroidism 4. HTN 5. Type 2 diabetes 6. CKD stage 3 7. COPD: Moderate obstruction on PFTs, prior smoker.  8. HFpEF: RHC (10/23) with mean RA 15, PA 36/20 mean 28, mean PCWP 19, PVR 1.2 WU, CI 2.65.  - Echo (4/24): EF 55%, mild LVH, normal RV systolic function with mild RV enlargement.    SH: Lives in [place], single, quit smoking 10 years ago, no ETOH.   Family History  Problem Relation Age of Onset   Liver cancer Mother    Heart disease Mother    Aneurysm Sister    ROS: All systems reviewed and negative except as per HPI.   Current Outpatient Medications  Medication Sig Dispense Refill   acetaminophen (TYLENOL) 500 MG tablet Take 1,000 mg by mouth every 6 (six) hours as needed for moderate pain.     albuterol (VENTOLIN HFA) 108 (90 Base) MCG/ACT inhaler Inhale 2 puffs into the lungs every 6 (six) hours as needed for wheezing or shortness of breath. 18 g 2   carvedilol (COREG) 6.25 MG tablet Take 6.25 mg by mouth 2 (two) times daily with a meal.     dapagliflozin propanediol (FARXIGA) 10 MG TABS tablet Take 1 tablet (10 mg total) by mouth daily before breakfast. 90 tablet 3   gabapentin (NEURONTIN) 100 MG capsule Take 100 mg by mouth daily as needed (pain).     hydrOXYzine (VISTARIL) 50 MG capsule  Take 1 capsule (50 mg total) by mouth 3 (three) times daily as needed for anxiety or nausea. 90 capsule 2   lactose free nutrition (BOOST) LIQD Take 237 mLs by mouth every evening.     lactulose (CHRONULAC) 10 GM/15ML solution Take 30 mLs (20 g total) by mouth 2 (two) times daily. 1800 mL 2   levothyroxine (SYNTHROID) 175 MCG tablet Take 175 mcg by mouth daily before breakfast.     ondansetron (ZOFRAN) 8 MG tablet Take by mouth every 8 (eight) hours as needed for nausea or vomiting.     pantoprazole (PROTONIX) 40 MG tablet Take 1 tablet (40  mg total) by mouth daily. 90 tablet 3   potassium chloride SA (KLOR-CON M) 20 MEQ tablet Take 2 tablets (40 mEq total) by mouth 2 (two) times daily. 180 tablet 3   rifaximin (XIFAXAN) 550 MG TABS tablet Take 1 tablet (550 mg total) by mouth 2 (two) times daily. 180 tablet 3   sertraline (ZOLOFT) 100 MG tablet Take 100 mg by mouth daily.     spironolactone (ALDACTONE) 100 MG tablet Take 100 mg by mouth daily.     torsemide (DEMADEX) 20 MG tablet Take 2 tablets (40 mg total) by mouth 2 (two) times daily. 120 tablet 3   traZODone (DESYREL) 50 MG tablet Take 1 tablet (50 mg total) by mouth at bedtime. 90 tablet 3   No current facility-administered medications for this encounter.   Wt Readings from Last 3 Encounters:  12/20/22 (!) 173.1 kg (381 lb 9.6 oz)  11/28/22 (!) 172.4 kg (380 lb)  10/26/22 (!) 173.4 kg (382 lb 4.4 oz)   BP (!) 140/66   Pulse 86   Wt (!) 173.1 kg (381 lb 9.6 oz)   SpO2 98%   BMI 48.99 kg/m  General:  Chronically ill appearing HEENT: normal Neck: supple. JVP 10-12 Cor: PMI nondisplaced. Regular rate & rhythm. No rubs, gallops or murmurs. Lungs: clear Abdomen: obese, soft, nontender, + distended.  Extremities: no cyanosis, clubbing, rash, 2-3+ edema to waist Neuro: alert & orientedx3. Affect pleasant  ECG: SR 69 bpm  Assessment/Plan: 1.  Acute on chronic diastolic CHF: RHC in 10/23 with mildly elevated filling pressures and pulmonary venous hypertension.  Echo in 4/24 showed EF 55%, mild LVH, normal RV systolic function with mild RV enlargement.  Patient has struggled with volume retention in setting of diastolic CHF and cirrhosis.  - NYHA III. Volume overloaded. He recently requested to cut back diuretics. Explained to patient that he will need higher doses of diuretic on a long-term basis. Currently on 60 mg Torsemide daily. Offered to try IV diuresis at home with 3 days of Furoscix + metolazone. He declined and states he would prefer admission. He plans to go to  [facility] ED after today's visit.  - If admitted, would continue Torsemide at discharge instead of using Furosemide.  - Continue Farxiga 10 mg daily - Continue KCL 40 mEq bid (Avoid hypokalemia with h/o hepatic encephalopathy).  - Continue spironolactone 100 mg daily.  He does not appear to have significant ascites on exam.  - He would be a reasonable Cardiomems candidate.   - Wear compression stockings.  - Now followed by [facility] paramedicine. 2. NASH cirrhosis: Stage IV by liver biopsy in 1/23.  Suspect due to NASH. He has h/o hepatic encephalopathy as well as splenomegaly with thrombocytopenia.  - Follows with GI in [place].  - Recent LFTs stable. 3. Thrombocytopenia: Due to cirrhosis/splenomegaly. Stable/chronic.  4. COPD:  No longer smokes.  Moderate obstruction on PFTs.  5. OSA: Mild on sleep study in 08/24 -Does not want to use CPAP  Follow up 3 weeks with APP  [facility], LINDSAY N  12/20/2022  Patient seen with NP, agree with the above note.   Daniel Crawford for close cardiology followup.  At last appointment he was volume overloaded and I increased his torsemide to 60 mg bid and added Farxiga.  He called in to say that he was "urinating too much" and was incontinent and asked to decrease the dose.  He was decreased to 40 mg bid.  Today, legs are more swollen and weight is up a pound.    General: NAD Neck: JVP 10-12, no thyromegaly or thyroid nodule.  Lungs: Clear to auscultation bilaterally with normal respiratory effort. CV: Nondisplaced PMI.  Heart regular S1/S2, no S3/S4, no murmur.  2+ edema to knees.  No carotid bruit.  Normal pedal pulses.  Abdomen: Soft, nontender, no hepatosplenomegaly, mild distention.  Skin: Intact without lesions or rashes.  Neurologic: Alert and oriented x 3.  Psych: Normal affect. Extremities: No clubbing or cyanosis.  HEENT: Normal.   Patient is significantly volume overloaded still.  I recommended Furoscix 80 mg Luna daily x 3 days then  increase torsemide back to 60 mg bid after that in order to try to keep him out of the hospital.  However, he wants admission for diuresis.  I recommended that he be admitted to [facility] today to follow him on the heart failure service, but he insists on [facility] because it is closer to his family. He will drive up to the [facility] ER for admission.  We will make followup here in 3 wks with him.  When he is discharged from [facility], needs to be sent out on an adequate dose of diuretic, ideally torsemide 60 mg bid with metolazone 2.5 once or twice a week if renal function remains stable.   Marca Ancona 12/20/2022 4:56 PM

## 2022-12-20 NOTE — ED Notes (Signed)
Pt requested ammonia level drawn Stated that his level has been too high

## 2022-12-20 NOTE — Patient Instructions (Addendum)
EKG done today.  Labs done today. We will contact you only if your labs are abnormal.  No medication changes were made. Please continue all current medications as prescribed.  Your physician recommends that you schedule a follow-up appointment in: 3 weeks with our NP/PA Clinic here in our office.   If you have any questions or concerns before your next appointment please send [place] a message through [place] or call our office at [phone].    TO LEAVE A MESSAGE FOR THE NURSE SELECT OPTION 2, PLEASE LEAVE A MESSAGE INCLUDING: YOUR NAME DATE OF BIRTH CALL BACK NUMBER REASON FOR CALL**this is important as we prioritize the call backs  YOU WILL RECEIVE A CALL BACK THE SAME DAY AS LONG AS YOU CALL BEFORE 4:00 PM   Do the following things EVERYDAY: Weigh yourself in the morning before breakfast. Write it down and keep it in a log. Take your medicines as prescribed Eat low salt foods--Limit salt (sodium) to 2000 mg per day.  Stay as active as you can everyday Limit all fluids for the day to less than 2 liters   At the [facility], you and your health needs are our priority. As part of our continuing mission to provide you with exceptional heart care, we have created designated Provider Care Teams. These Care Teams include your primary Cardiologist (physician) and Advanced Practice Providers (APPs- Physician Assistants and Nurse Practitioners) who all work together to provide you with the care you need, when you need it.   You [place] see any of the following providers on your designated Care Team at your next follow up: Dr Arvilla Meres Dr Marca Ancona Dr. Marcos Eke, NP Robbie Lis, [place] [facility] [place], [place] Brynda Peon, NP Karle Plumber, PharmD   Please be sure to bring in all your medications bottles to every appointment.    Thank you for choosing [facility]

## 2022-12-21 ENCOUNTER — Telehealth (HOSPITAL_COMMUNITY): Payer: Self-pay | Admitting: Licensed Clinical Social Worker

## 2022-12-21 DIAGNOSIS — K7460 Unspecified cirrhosis of liver: Secondary | ICD-10-CM | POA: Diagnosis not present

## 2022-12-21 DIAGNOSIS — N1831 Chronic kidney disease, stage 3a: Secondary | ICD-10-CM

## 2022-12-21 DIAGNOSIS — K7581 Nonalcoholic steatohepatitis (NASH): Secondary | ICD-10-CM

## 2022-12-21 DIAGNOSIS — Z6841 Body Mass Index (BMI) 40.0 and over, adult: Secondary | ICD-10-CM

## 2022-12-21 DIAGNOSIS — F418 Other specified anxiety disorders: Secondary | ICD-10-CM

## 2022-12-21 DIAGNOSIS — E039 Hypothyroidism, unspecified: Secondary | ICD-10-CM

## 2022-12-21 DIAGNOSIS — K219 Gastro-esophageal reflux disease without esophagitis: Secondary | ICD-10-CM

## 2022-12-21 DIAGNOSIS — I10 Essential (primary) hypertension: Secondary | ICD-10-CM

## 2022-12-21 DIAGNOSIS — I5033 Acute on chronic diastolic (congestive) heart failure: Secondary | ICD-10-CM | POA: Diagnosis not present

## 2022-12-21 DIAGNOSIS — L02416 Cutaneous abscess of left lower limb: Secondary | ICD-10-CM

## 2022-12-21 DIAGNOSIS — D696 Thrombocytopenia, unspecified: Secondary | ICD-10-CM

## 2022-12-21 DIAGNOSIS — L03116 Cellulitis of left lower limb: Secondary | ICD-10-CM

## 2022-12-21 LAB — CBC
HCT: 28.4 % — ABNORMAL LOW (ref 39.0–52.0)
Hemoglobin: 9 g/dL — ABNORMAL LOW (ref 13.0–17.0)
MCH: 28.7 pg (ref 26.0–34.0)
MCHC: 31.7 g/dL (ref 30.0–36.0)
MCV: 90.4 fL (ref 80.0–100.0)
Platelets: 58 10*3/uL — ABNORMAL LOW (ref 150–400)
RBC: 3.14 MIL/uL — ABNORMAL LOW (ref 4.22–5.81)
RDW: 15.8 % — ABNORMAL HIGH (ref 11.5–15.5)
WBC: 3.7 10*3/uL — ABNORMAL LOW (ref 4.0–10.5)
nRBC: 0 % (ref 0.0–0.2)

## 2022-12-21 LAB — GLUCOSE, CAPILLARY
Glucose-Capillary: 121 mg/dL — ABNORMAL HIGH (ref 70–99)
Glucose-Capillary: 137 mg/dL — ABNORMAL HIGH (ref 70–99)
Glucose-Capillary: 127 mg/dL — ABNORMAL HIGH (ref 70–99)
Glucose-Capillary: 149 mg/dL — ABNORMAL HIGH (ref 70–99)

## 2022-12-21 LAB — COMPREHENSIVE METABOLIC PANEL
ALT: 26 U/L (ref 0–44)
AST: 50 U/L — ABNORMAL HIGH (ref 15–41)
Albumin: 3.1 g/dL — ABNORMAL LOW (ref 3.5–5.0)
Alkaline Phosphatase: 77 U/L (ref 38–126)
Anion gap: 10 (ref 5–15)
BUN: 32 mg/dL — ABNORMAL HIGH (ref 8–23)
CO2: 29 mmol/L (ref 22–32)
Calcium: 8.8 mg/dL — ABNORMAL LOW (ref 8.9–10.3)
Chloride: 96 mmol/L — ABNORMAL LOW (ref 98–111)
Creatinine, Ser: 1.79 mg/dL — ABNORMAL HIGH (ref 0.61–1.24)
GFR, Estimated: 42 mL/min — ABNORMAL LOW (ref >60)
Glucose, Bld: 125 mg/dL — ABNORMAL HIGH (ref 70–99)
Potassium: 3.7 mmol/L (ref 3.5–5.1)
Sodium: 135 mmol/L (ref 135–145)
Total Bilirubin: 3.5 mg/dL — ABNORMAL HIGH (ref 0.3–1.2)
Total Protein: 6.5 g/dL (ref 6.5–8.1)

## 2022-12-21 LAB — MAGNESIUM: Magnesium: 2.5 mg/dL — ABNORMAL HIGH (ref 1.7–2.4)

## 2022-12-21 MED ORDER — ACETAMINOPHEN 325 MG PO TABS
650.0000 mg | ORAL_TABLET | Freq: Four times a day (QID) | ORAL | Status: DC | PRN
  Administered 2022-12-21 – 2022-12-23 (×3): 650 mg via ORAL
  Filled 2022-12-21 (×4): qty 2

## 2022-12-21 MED ORDER — FUROSEMIDE 10 MG/ML IJ SOLN
80.0000 mg | Freq: Three times a day (TID) | INTRAMUSCULAR | Status: DC
  Administered 2022-12-21 – 2022-12-25 (×12): 80 mg via INTRAVENOUS
  Filled 2022-12-21 (×12): qty 8

## 2022-12-21 MED ORDER — INFLUENZA VIRUS VACC SPLIT PF (FLUZONE) 0.5 ML IM SUSY
0.5000 mL | PREFILLED_SYRINGE | INTRAMUSCULAR | Status: AC
  Administered 2022-12-22: 0.5 mL via INTRAMUSCULAR
  Filled 2022-12-21: qty 0.5

## 2022-12-21 NOTE — Consult Note (Addendum)
Cardiology Consultation   Patient ID: Daniel Crawford MRN: [id number]; DOB: [birth year]  Admit date: 12/20/2022 Date of Consult: 12/21/2022  PCP:  [name], MD   [facility] Providers Cardiologist: Nona Dell, MD AHF: Dr. Shirlee Latch  Patient Profile:   Daniel Crawford is a 67 y.o. male with a hx of chronic HFpEF, HTN, Type 2 DM, Stage 3 CKD, Stage 4 cirrhosis, thrombocytopenia (due to splenomegaly) and COPD who is being seen 12/21/2022 for the evaluation of CHF at the request of Dr. Randol Kern.  History of Present Illness:   Daniel Crawford was examined in clinic by Dr. Shirlee Latch yesterday and reported feeling awful with his legs being significantly swollen and painful. Also reported shortness of breath with activity and orthopnea. He was taking Torsemide 60 mg daily instead of 40 mg twice daily as prescribed and weight had trended up to 381 lbs. Options were reviewed in regards to Furoscix plus Metolazone at home vs. admission and he preferred admission to [facility] (declined admission to [facility]). It was recommended to continue Torsemide 60mg  BID and Metolazine 2.5mg  once or twice a week at discharge (if renal function remained stable) instead of switching to Lasix along with continuing Farxiga and Spironolactone 100 mg daily.  Upon arrival to the ED, initial labs showed WBC 3.8, Hgb 9.8 (close to baseline), platelets 63 (close to baseline), Na+ 134, K+ 4.2 and creatinine 1.92 (baseline 1.1-1.3). BNP 56. Albumin 3.1, AST 52 and ALT 26.  Ammonia elevated to 47. CXR showed mild vascular congestion without edema. EKG showed NSR, HR 69 with 1st degree AV Block and TWI along Lead III.   He received IV Lasix 60mg  while in the ED and has been started on 80mg  BID. Also scheduled to receive Farxiga 10mg  daily and Spironolactone 100mg  daily. Recorded output of -560 mL thus far and weight down to 379 lbs today. Labs this AM show K+ at 3.7 and creatinine improved to 1.79.  In talking the patient today,  he reports he has experienced worsening dyspnea on exertion for the past few weeks.  When asked about his baseline weight, he reported initially this was in the 230's to 240's but says he has not weighed that for years. By review of weights in our system, his weight was at 372 lbs in 07/2022. Says his biggest issue over the past few days has been constipation and he has been taking MiraLAX at home to help with this. Also reports intermittent nausea as well. No specific chest pain or palpitations.  Past Medical History:  Diagnosis Date   Anemia    Anxiety    Asthma    CHF (congestive heart failure) (HCC)    CKD (chronic kidney disease)    Depression    Diabetes mellitus without complication (HCC)    Dyspnea    Hypertension    Hypothyroidism    Liver cirrhosis secondary to NASH (HCC)    NASH (nonalcoholic steatohepatitis)    Pre-diabetes    Spleen enlarged    Thrombocytopenia (HCC)     Past Surgical History:  Procedure Laterality Date   Bilateral hernia surgery     2006, 2007   CHOLECYSTECTOMY     2016    COLONOSCOPY WITH PROPOFOL N/A 06/28/2021   Procedure: COLONOSCOPY WITH PROPOFOL;  Surgeon: Malissa Hippo, MD;  [facility];  Service: Endoscopy;  Laterality: N/A;  1020   ESOPHAGEAL DILATION N/A 11/06/2019   Procedure: ESOPHAGEAL DILATION;  Surgeon: Dolores Frame, MD;  [facility]:  [facility];  Service: Gastroenterology;  Laterality: N/A;   ESOPHAGOGASTRODUODENOSCOPY (EGD) WITH PROPOFOL N/A 11/06/2019   Procedure: ESOPHAGOGASTRODUODENOSCOPY (EGD) WITH PROPOFOL;  Surgeon: Dolores Frame, MD;  [facility];  Service: Gastroenterology;  Laterality: N/A;  1045   IR RADIOLOGIST EVAL & MGMT  02/19/2022   LAPAROSCOPIC ASSISTED VENTRAL HERNIA REPAIR     Polyp removed     in January of 2018.    POLYPECTOMY  06/28/2021   Procedure: POLYPECTOMY INTESTINAL;  Surgeon: Malissa Hippo, MD;  Location: [facility];  Service: Endoscopy;;   RIGHT  HEART CATH N/A 01/08/2022   Procedure: RIGHT HEART CATH;  Surgeon: Corky Crafts, MD;  Location: [facility] INVASIVE CV LAB;  Service: Cardiovascular;  Laterality: N/A;     Home Medications:  Prior to Admission medications   Medication Sig Start Date End Date Taking? Authorizing Provider  acetaminophen (TYLENOL) 500 MG tablet Take 1,000 mg by mouth every 6 (six) hours as needed for moderate pain.   Yes [provider]  albuterol (VENTOLIN HFA) 108 (90 Base) MCG/ACT inhaler Inhale 2 puffs into the lungs every 6 (six) hours as needed for wheezing or shortness of breath. 05/01/22  Yes Oretha Milch, MD  carvedilol (COREG) 6.25 MG tablet Take 6.25 mg by mouth 2 (two) times daily with a meal.   Yes [provider]  dapagliflozin propanediol (FARXIGA) 10 MG TABS tablet Take 1 tablet (10 mg total) by mouth daily before breakfast. 11/28/22  Yes Laurey Morale, MD  gabapentin (NEURONTIN) 100 MG capsule Take 100 mg by mouth daily as needed (pain). 02/20/21  Yes [provider]  hydrOXYzine (VISTARIL) 50 MG capsule Take 1 capsule (50 mg total) by mouth 3 (three) times daily as needed for anxiety or nausea. 01/25/22  Yes Emokpae, Courage, MD  lactose free nutrition (BOOST) LIQD Take 237 mLs by mouth every evening.   Yes [provider]  lactulose (CHRONULAC) 10 GM/15ML solution Take 30 mLs (20 g total) by mouth 2 (two) times daily. 10/26/22 01/24/23 Yes Vassie Loll, MD  levothyroxine (SYNTHROID) 175 MCG tablet Take 175 mcg by mouth daily before breakfast.   Yes [provider]  ondansetron (ZOFRAN) 4 MG tablet Take 4 mg by mouth 2 (two) times daily as needed. 10/28/22  Yes [provider]  pantoprazole (PROTONIX) 40 MG tablet Take 1 tablet (40 mg total) by mouth daily. 01/25/22  Yes Emokpae, Courage, MD  polyethylene glycol (MIRALAX / GLYCOLAX) 17 g packet Take 17 g by mouth daily.   Yes [provider]  potassium chloride SA (KLOR-CON M) 20 MEQ tablet  Take 2 tablets (40 mEq total) by mouth 2 (two) times daily. 07/25/22  Yes Laurey Morale, MD  rifaximin (XIFAXAN) 550 MG TABS tablet Take 1 tablet (550 mg total) by mouth 2 (two) times daily. 01/25/22  Yes Emokpae, Courage, MD  sertraline (ZOLOFT) 100 MG tablet Take 100 mg by mouth daily. 09/24/22  Yes [provider]  spironolactone (ALDACTONE) 100 MG tablet Take 100 mg by mouth daily.   Yes [provider]  torsemide (DEMADEX) 20 MG tablet Take 2 tablets (40 mg total) by mouth 2 (two) times daily. 12/12/22  Yes Laurey Morale, MD  traZODone (DESYREL) 50 MG tablet Take 1 tablet (50 mg total) by mouth at bedtime. 01/25/22  Yes Shon Hale, MD    Inpatient Medications: Scheduled Meds:  carvedilol  6.25 mg Oral BID WC   dapagliflozin propanediol  10 mg  Oral QAC breakfast   doxycycline  100 mg Oral Q12H   furosemide  80 mg Intravenous BID   gabapentin  200 mg Oral QHS   [START ON 12/22/2022] influenza vac split trivalent PF  0.5 mL Intramuscular Tomorrow-1000   insulin aspart  0-9 Units Subcutaneous TID WC   lactulose  20 g Oral BID   levothyroxine  175 mcg Oral QAC breakfast   pantoprazole  40 mg Oral Daily   polyethylene glycol  17 g Oral Daily   potassium chloride SA  40 mEq Oral BID   rifaximin  550 mg Oral BID   sertraline  100 mg Oral Daily   spironolactone  100 mg Oral Daily   traZODone  50 mg Oral QHS   Continuous Infusions:  PRN Meds: hydrOXYzine  Allergies:   No Known Allergies  Social History:   Social History   Socioeconomic History   Marital status: Single    Spouse name: Not on file   Number of children: Not on file   Years of education: Not on file   Highest education level: Not on file  Occupational History   Not on file  Tobacco Use   Smoking status: Former    Current packs/day: 0.00    Average packs/day: 1 pack/day for 20.0 years (20.0 ttl pk-yrs)    Types: Cigarettes    Start date: 58    Quit date: 2017    Years since quitting:  7.7    Passive exposure: Current   Smokeless tobacco: Never  Vaping Use   Vaping status: Never Used  Substance and Sexual Activity   Alcohol use: No   Drug use: No   Sexual activity: Not on file  Other Topics Concern   Not on file  Social History Narrative   Not on file   Social Determinants of Health   Financial Resource Strain: Low Risk  (03/28/2021)   Received from [facility]   Overall Financial Resource Strain (CARDIA)    Difficulty of Paying Living Expenses: Not hard at all  Food Insecurity: No Food Insecurity (12/21/2022)   Hunger Vital Sign    Worried About Running Out of Food in the Last Year: Never true    Ran Out of Food in the Last Year: Never true  Transportation Needs: No Transportation Needs (12/21/2022)   PRAPARE - Administrator, Civil Service (Medical): No    Lack of Transportation (Non-Medical): No  Physical Activity: Not on file  Stress: Not on file  Social Connections: Not on file  Intimate Partner Violence: Not At Risk (12/21/2022)   Humiliation, Afraid, Rape, and Kick questionnaire    Fear of Current or Ex-Partner: No    Emotionally Abused: No    Physically Abused: No    Sexually Abused: No    Family History:    Family History  Problem Relation Age of Onset   Liver cancer Mother    Heart disease Mother    Aneurysm Sister      ROS:  Please see the history of present illness.   All other ROS reviewed and negative.     Physical Exam/Data:   Vitals:   12/20/22 2315 12/21/22 0005 12/21/22 0418 12/21/22 0500  BP: (!) 130/55 (!) 140/62 122/61   Pulse: 75 80 84   Resp: 18 20 20    Temp: 98.3 F (36.8 C) 98.4 F (36.9 C) 98.2 F (36.8 C)   TempSrc: Oral Oral    SpO2: 97%  100% 97%   Weight: (!) 172.2 kg   (!) 172.2 kg  Height:        Intake/Output Summary (Last 24 hours) at 12/21/2022 0801 Last data filed at 12/21/2022 0616 Gross per 24 hour  Intake 240 ml  Output 800 ml  Net -560 ml      12/21/2022     5:00 AM 12/20/2022   11:15 PM 12/20/2022    2:00 PM  Last 3 Weights  Weight (lbs) 379 lb 10.1 oz 379 lb 10.1 oz 381 lb 6.3 oz  Weight (kg) 172.2 kg 172.2 kg 173 kg     Body mass index is 50.09 kg/m.  General: Pleasant male appearing in no acute distress.  HEENT: normal Neck: JVD difficult to assess secondary to body habitus. No carotid bruits.  Vascular: No carotid bruits; Distal pulses 2+ bilaterally Cardiac:  normal S1, S2; RRR; no murmur  Lungs: No wheezing, rales or rhonchi.  Respiratory effort normal. Abd: appears distended. No tenderness.  Ext: 2+ pitting edema bilaterally.  Musculoskeletal:  No deformities, BUE and BLE strength normal and equal Skin: warm and dry  Neuro:  CNs 2-12 intact, no focal abnormalities noted Psych:  Normal affect   EKG:  The EKG was personally reviewed and demonstrates: NSR, HR 69 with 1st degree AV Block and TWI along Lead III.   Telemetry:  Telemetry was personally reviewed and demonstrates: NSR, HR 70's to 80's.   Relevant CV Studies:  RHC: 12/2022   Hemodynamic findings consistent with mild pulmonary hypertension.   Noninvasive aortic saturation 99%, PA saturation 69%, PA pressure 36/20, mean PA pressure 28 mmHg, mean pulmonary capillary wedge pressure 19 mmHg, cardiac output 7.61 L/min, cardiac index 2.65.   Still with evidence of volume overload and mild pulmonary hypertension.  Continue medical management of diastolic heart failure.  Echocardiogram: 06/2022 IMPRESSIONS     1. Left ventricular ejection fraction, by estimation, is 55%. The left  ventricle has normal function. The left ventricle has no regional wall  motion abnormalities. There is mild left ventricular hypertrophy. Left  ventricular diastolic parameters were  normal.   2. Right ventricular systolic function is normal. The right ventricular  size is mildly enlarged. Tricuspid regurgitation signal is inadequate for  assessing PA pressure.   3. Left atrial size was mildly  dilated.   4. Right atrial size was mildly dilated.   5. The mitral valve is normal in structure. Trivial mitral valve  regurgitation. No evidence of mitral stenosis.   6. The aortic valve is grossly normal. Aortic valve regurgitation is not  visualized. No aortic stenosis is present.   7. The inferior vena cava is dilated in size with >50% respiratory  variability, suggesting right atrial pressure of 8 mmHg.    Laboratory Data:  High Sensitivity Troponin:  No results for input(s): "TROPONINIHS" in the last 720 hours.   Chemistry Recent Labs  Lab 12/20/22 1141 12/20/22 1418 12/21/22 0426  NA 132* 134* 135  K 4.4 4.2 3.7  CL 96* 95* 96*  CO2 29 31 29   GLUCOSE 150* 209* 125*  BUN 32* 34* 32*  CREATININE 2.04* 1.92* 1.79*  CALCIUM 8.8* 9.1 8.8*  MG  --   --  2.5*  GFRNONAA 36* 38* 42*  ANIONGAP 7 8 10     Recent Labs  Lab 12/20/22 1418 12/21/22 0426  PROT 6.8 6.5  ALBUMIN 3.1* 3.1*  AST 52* 50*  ALT 26 26  ALKPHOS 81 77  BILITOT 3.5* 3.5*  Lipids No results for input(s): "CHOL", "TRIG", "HDL", "LABVLDL", "LDLCALC", "CHOLHDL" in the last 168 hours.  Hematology Recent Labs  Lab 12/20/22 1141 12/20/22 1418 12/21/22 0426  WBC 3.8* 3.5* 3.7*  RBC 3.31* 3.34* 3.14*  HGB 9.8* 9.7* 9.0*  HCT 29.7* 30.6* 28.4*  MCV 89.7 91.6 90.4  MCH 29.6 29.0 28.7  MCHC 33.0 31.7 31.7  RDW 15.6* 15.8* 15.8*  PLT 63* 66* 58*   Thyroid No results for input(s): "TSH", "FREET4" in the last 168 hours.  BNP Recent Labs  Lab 12/20/22 1141 12/20/22 1418  BNP 50.8 56.0    DDimer No results for input(s): "DDIMER" in the last 168 hours.   Radiology/Studies:  DG Chest 2 View  Result Date: 12/20/2022 CLINICAL DATA:  Worsening bilateral lower extremity swelling. EXAM: CHEST - 2 VIEW COMPARISON:  10/19/2022 FINDINGS: Cardiopericardial silhouette is at upper limits of normal for size. The cardiopericardial silhouette is within normal limits for size. Mild vascular congestion without  edema. No pleural effusion. No acute bony abnormality. IMPRESSION: Mild vascular congestion without edema. Electronically Signed   By: Kennith Center M.D.   On: 12/20/2022 16:27     Assessment and Plan:   1. Acute HFpEF - He is at least 10+ pounds above his baseline weight and by review of notes, it sounds like this was in the setting of him reducing his diuretic due to frequent urination and incontinence. BNP at 56 on admission but likely not accurate given his body habitus. CXR did show mild vascular congestion. - He is significantly volume overloaded on examination today and reports he is already responding well to IV Lasix.  He is scheduled to receive 80 mg twice daily today and would continue to follow I's and O's along with daily weights.  Renal function is improving with diuresis as creatinine was at 1.92 on admission and is at 1.79 today. Also continue additional medical therapy with Coreg 6.25 mg twice daily, Farxiga 10 mg daily and Spironolactone 100 mg daily. Reviewed with the patient that Cardiology is not present at [facility] over the weekend but if questions or concerns arise, the Hospitalist can reach out to the covering Cardiology team at [facility].   2. HTN - BP is well-controlled at 122/61 on most recent check. Continue current medical therapy with Coreg 6.25 mg twice daily and Spironolactone 100 mg daily.  3. Stage IV Cirrhosis - Followed by GI as an outpatient. Remains on Coreg 6.25 mg twice daily, Spironolactone 100 mg daily, Rifaximin and Lactulose.  4. Stage 3 CKD - Baseline creatinine 1.1 - 1.3. At 1.92 on admission and improving to 1.79 today with diuresis. Repeat BMET in AM.   5. Anemia/Thrombocytopenia - CBC this morning shows hemoglobin at 9.0 and platelets at 58 K which is close to his known baseline. No reports of active bleeding.    For questions or updates, please contact  [facility] Please consult [email or web address] for contact info under     Signed, Ellsworth Lennox, PA-C  12/21/2022 8:01 AM  Attending note  Patient seen and discussed with PA Iran Ouch, I agree with her documentation. 67 yo male history of NASH cirrhosis, chronic HFpEF, DM2, CKD 3, COPD, presents with progressive LE edema and SOB. REports medicatin compliance including with diuretics.    K 4.4 Cr 2.03 BUN 32 BNP 50 WBC 3.8 Hgb 9.8 Plt 63 06/2022 echo: LVE 55%, no WMAs, normal diastolic fuxn, normal RV, mild LAE   12/2021 RHC: mean PA 28, PCWP 19,  CI 2.65  1.Acute on chronic HFpE - 06/2022 echo: LVE 55%, no WMAs, normal diastolic fuxn, normal RV, mild LAE - BNP 50, mild congestion on CXR - early on in diuresis, negative so far. He received IV lasix 60mg  x 1 yesterday, due for 80mg  IV bid today. Cr decreasing with diuresis consistent with venous congestion and HF. Continue IV diuresis today.  - continue SGLT2i in setting of HFpEF, GFR 42 this AM  - at d/c HF clinic has recommended torsemide 60mg  bid and metolazone 2.5mg  two times a week if renal function stable.    2. Cirrhosis - per primary team  Dina Rich MD

## 2022-12-21 NOTE — TOC Initial Note (Signed)
Transition of Care [facility]) - Initial/Assessment Note    Patient Details  Name: Daniel Crawford MRN: [id number] Date of Birth: [birth year]  Transition of Care [facility]) CM/SW Contact:    Villa Herb, LCSWA Phone Number: 12/21/2022, 9:18 AM  Clinical Narrative:                 Pt is high risk for readmission. TOC also consulted for CHF screen. CSW spoke with pt who states he lives alone. Pt completes his ADLs independently. Pt is able to drive himself to appointments when needed. Pt has had HH in the past and is interested in this being set up once D/C from hospital. Pt has a cane and walker to use in the home if needed.   Pt states that he weighs himself daily. Pt states that he has a paramedic that comes in and sorts his medications weekly for him. He takes them as prescribed. Pt states that he follows a heart healthy diet. TOC to follow.   Expected Discharge Plan: Home w Home Health Services Barriers to Discharge: Continued Medical Work up   Patient Goals and CMS Choice Patient states their goals for this hospitalization and ongoing recovery are:: return home CMS Medicare.gov Compare Post Acute Care list provided to:: Patient Choice offered to / list presented to : Patient      Expected Discharge Plan and Services In-house Referral: Clinical Social Work Discharge Planning Services: CM Consult Post Acute Care Choice: Home Health Living arrangements for the past 2 months: Single Family Home                                      Prior Living Arrangements/Services Living arrangements for the past 2 months: Single Family Home Lives with:: Self Patient language and need for interpreter reviewed:: Yes Do you feel safe going back to the place where you live?: Yes      Need for Family Participation in Patient Care: Yes (Comment) Care giver support system in place?: Yes (comment) Current home services: DME Criminal Activity/Legal Involvement Pertinent to Current  Situation/Hospitalization: No - Comment as needed  Activities of Daily Living   ADL Screening (condition at time of admission) Does the patient have a NEW difficulty with bathing/dressing/toileting/self-feeding that is expected to last >3 days?: No Does the patient have a NEW difficulty with getting in/out of bed, walking, or climbing stairs that is expected to last >3 days?: No Does the patient have a NEW difficulty with communication that is expected to last >3 days?: No Is the patient deaf or have difficulty hearing?: No Does the patient have difficulty seeing, even when wearing glasses/contacts?: No Does the patient have difficulty concentrating, remembering, or making decisions?: No  Permission Sought/Granted                  Emotional Assessment Appearance:: Appears stated age Attitude/Demeanor/Rapport: Engaged Affect (typically observed): Accepting Orientation: : Oriented to Self, Oriented to  Time, Oriented to Situation, Oriented to Place Alcohol / Substance Use: Not Applicable Psych Involvement: No (comment)  Admission diagnosis:  Anasarca [R60.1] Acute on chronic diastolic CHF (congestive heart failure) (HCC) [I50.33] Cellulitis, unspecified cellulitis site [L03.90] Patient Active Problem List   Diagnosis Date Noted   Acute on chronic diastolic CHF (congestive heart failure) (HCC) 12/20/2022   Cellulitis and abscess of left leg 10/23/2022   Chronic hyponatremia 10/21/2022   Acute hepatic encephalopathy (HCC) 10/20/2022  Acute exacerbation of congestive heart failure (HCC) 10/03/2022   Acute pancreatitis 10/02/2022   Hepatic encephalopathy (HCC) 07/10/2022   T2DM (type 2 diabetes mellitus) (HCC) 07/06/2022   CKD stage 3a, GFR 45-59 ml/min (HCC) 07/06/2022   Umbilical hernia 07/05/2022   Decompensated liver disease (HCC) 07/04/2022   Morbid obesity (HCC) 05/25/2022   Decompensation of cirrhosis of liver (HCC) 05/21/2022   Pancytopenia (HCC) 05/21/2022   Acute  exacerbation of CHF (congestive heart failure) (HCC) 04/06/2022   Liver cirrhosis secondary to NASH (HCC) 03/13/2022   Iron deficiency anemia 02/28/2022   Hypoalbuminemia due to protein-calorie malnutrition (HCC) 02/28/2022   GERD (gastroesophageal reflux disease) 02/28/2022   Abdominal pain 02/28/2022   Anasarca 01/23/2022   Generalized weakness 01/23/2022   Essential hypertension 01/23/2022   Volume overload 01/23/2022   Hypervolemia    Elevated TSH 01/22/2022   D-dimer, elevated 01/22/2022   Protein calorie malnutrition (HCC) 01/22/2022   Chronic diastolic heart failure (HCC)    Hypogonadism, male 12/26/2021   History of colonic polyps 07/27/2021   Auditory hallucinations 07/27/2021   Leukopenia 04/19/2021   Hypokalemia 04/18/2021   Cellulitis of left leg 03/26/2021   Dyspnea on exertion 01/20/2020   OSA (obstructive sleep apnea) 01/20/2020   History of ascites 10/22/2019   NASH (nonalcoholic steatohepatitis) 06/19/2019   Other ascites 06/19/2019   Acquired hypothyroidism 10/16/2016   Cirrhosis of liver with ascites (HCC) 10/16/2016   Depression with anxiety 10/16/2016   Thrombocytopenia (HCC) 10/16/2016   Spleen enlarged 10/16/2016   NAFLD (nonalcoholic fatty liver disease) 08/65/7846   Polyp of ascending colon 02/28/2016   Type 2 diabetes mellitus (HCC) 02/28/2016   Class 3 severe obesity in adult [facility]) 02/28/2016   PCP:  [name], MD Pharmacy:   [id number] - [place], [place] - [address] [facility] [address] [place] [address] Phone: [phone] Fax: [phone]     Social Determinants of Health (SDOH) Social History: SDOH Screenings   Food Insecurity: No Food Insecurity (12/21/2022)  Housing: Low Risk  (12/21/2022)  Transportation Needs: No Transportation Needs (12/21/2022)  Utilities: Not At Risk (12/21/2022)  Depression (PHQ2-9): Medium Risk (10/27/2019)  Financial Resource Strain: Low Risk  (03/28/2021)    Received from [facility]  Tobacco Use: Medium Risk (12/20/2022)   SDOH Interventions:     Readmission Risk Interventions    12/21/2022    9:17 AM 10/20/2022    2:15 PM 10/04/2022    9:36 AM  Readmission Risk Prevention Plan  Transportation Screening Complete Complete Complete  Medication Review (RN Care Manager) Complete Complete Complete  PCP or Specialist appointment within 3-5 days of discharge  Not Complete   HRI or Home Care Consult Complete Complete Complete  SW Recovery Care/Counseling Consult Complete Complete Complete  Palliative Care Screening Not Applicable Not Applicable Not Applicable  Skilled Nursing Facility Not Applicable Not Applicable Not Applicable

## 2022-12-21 NOTE — Progress Notes (Signed)
PROGRESS NOTE  Daniel Crawford AOZ:[id number] DOB: 10-31-1955   PCP: [name], MD  Patient is from: Home.  Lives alone.  Uses cane for ambulation.  DOA: 12/20/2022 LOS: 1  Chief complaints Chief Complaint  Patient presents with   Leg Swelling     Brief Narrative / Interim history: 67 year old M with PMH of NASH cirrhosis, diastolic CHF, HTN, hypothyroidism, asthma and CKD-3B sent to ED from CHF clinic for IV diuretics due to weight gain, worsening edema and anasarca over the last few weeks.  Patient was advised to go to [facility] but decided to come to [facility] which is close to his home.  CXR consistent with CHF.  Patient was started on IV Lasix 80 mg twice daily.     Subjective: Seen and examined earlier this morning.  No major events overnight of this morning.  No complaints other than leg swelling.  He denies shortness of breath, chest pain, orthopnea, GI or UTI symptoms.  Objective: Vitals:   12/20/22 2315 12/21/22 0005 12/21/22 0418 12/21/22 0500  BP: (!) 130/55 (!) 140/62 122/61   Pulse: 75 80 84   Resp: 18 20 20    Temp: 98.3 F (36.8 C) 98.4 F (36.9 C) 98.2 F (36.8 C)   TempSrc: Oral Oral    SpO2: 97% 100% 97%   Weight: (!) 172.2 kg   (!) 172.2 kg  Height:        Examination:  GENERAL: No apparent distress.  Nontoxic. HEENT: MMM.  Vision and hearing grossly intact.  NECK: Supple.  No apparent JVD.  RESP:  No IWOB.  Fair aeration bilaterally. CVS:  RRR. Heart sounds normal.  ABD/GI/GU: BS+. Abd soft, NTND.  MSK/EXT:  Moves extremities. No apparent deformity.  2+ BLE edema but difficult due to body habitus SKIN: no apparent skin lesion or wound NEURO: Awake, alert and oriented appropriately.  No apparent focal neuro deficit. PSYCH: Calm. Normal affect.   Procedures:  None  Microbiology summarized: None  Assessment and plan: Principal Problem:   Acute on chronic diastolic CHF (congestive heart failure) (HCC) Active Problems:   Cirrhosis of  liver with ascites (HCC)   Essential hypertension   CKD stage 3a, GFR 45-59 ml/min (HCC)   Acquired hypothyroidism   Thrombocytopenia (HCC)   Depression with anxiety   Anasarca   GERD (gastroesophageal reflux disease)   Acute exacerbation of congestive heart failure (HCC)   NASH (nonalcoholic steatohepatitis)   Class 3 severe obesity in adult [facility])   Cellulitis and abscess of left leg  Acute on chronic diastolic CHF/anasarca/BLE edema: Seen to ED from CHF clinic due to anasarca/BLE edema.  TTE in 06/2022 with LVEF of 55%, normal RVSP.  BNP 50.8.  CXR concerning for CHF.  Started on IV Lasix 80 mg twice daily.  About 800 cc UOP overnight.  Creatinine improved.  BP stable. -Increase IV Lasix to 80 mg 3 times daily -Continue Aldactone 100 mg daily -Continue Farxiga. -Strict intake and output, daily weight, renal functions and electrolytes -Per admitting MD, Dr. Jearld Pies recommended torsemide 60 mg twice daily with metolazone 2.5 mg once or twice a week on discharge and outpatient follow-up in 3 weeks    NASH cirrhosis/Stage IV by liver biopsy, likely due to NASH History of hepatic encephalopathy/hyperammonemia Splenomegaly with thrombocytopenia -Continue to monitor LFTs closely -Continue with rifaximin and lactulose -Continue with diuresis and Aldactone   Thrombocytopenia: Due to liver cirrhosis and splenomegaly, chronic, stable at baseline -Continue monitoring   Chronic COPD: Stable.  He is no longer smokes   OSA: Does not want to use CPAP   Neuropathy -Continue with gabapentin   GERD -Continue with PPI    Morbid obesity Body mass index is 50.32 kg/m.   Depression-anxiety -Continue with Zoloft and hydroxyzine   Possible LLE cellulitis: Started on p.o. doxycycline for small wound in the left leg with surrounding erythema  Morbid obesity Body mass index is 50.09 kg/m. -Encourage lifestyle change to lose weight         DVT prophylaxis:  SCDs Start: 12/20/22 2201  due to thrombocytopenia  Code Status: Full code Family Communication: None at bedside Level of care: Telemetry Status is: Inpatient Remains inpatient appropriate because: Acute diastolic CHF   Final disposition: Home Consultants:  Cardiology  55 minutes with more than 50% spent in reviewing records, counseling patient/family and coordinating care.   Sch Meds:  Scheduled Meds:  carvedilol  6.25 mg Oral BID WC   dapagliflozin propanediol  10 mg Oral QAC breakfast   doxycycline  100 mg Oral Q12H   furosemide  80 mg Intravenous BID   gabapentin  200 mg Oral QHS   [START ON 12/22/2022] influenza vac split trivalent PF  0.5 mL Intramuscular Tomorrow-1000   insulin aspart  0-9 Units Subcutaneous TID WC   lactulose  20 g Oral BID   levothyroxine  175 mcg Oral QAC breakfast   pantoprazole  40 mg Oral Daily   polyethylene glycol  17 g Oral Daily   potassium chloride SA  40 mEq Oral BID   rifaximin  550 mg Oral BID   sertraline  100 mg Oral Daily   spironolactone  100 mg Oral Daily   traZODone  50 mg Oral QHS   Continuous Infusions: PRN Meds:.acetaminophen, hydrOXYzine  Antimicrobials: Anti-infectives (From admission, onward)    Start     Dose/Rate Route Frequency Ordered Stop   12/20/22 2215  rifaximin (XIFAXAN) tablet 550 mg        550 mg Oral 2 times daily 12/20/22 2200     12/20/22 2200  doxycycline (VIBRA-TABS) tablet 100 mg        100 mg Oral Every 12 hours 12/20/22 2153          I have personally reviewed the following labs and images: CBC: Recent Labs  Lab 12/20/22 1141 12/20/22 1418 12/21/22 0426  WBC 3.8* 3.5* 3.7*  NEUTROABS  --  2.2  --   HGB 9.8* 9.7* 9.0*  HCT 29.7* 30.6* 28.4*  MCV 89.7 91.6 90.4  PLT 63* 66* 58*   BMP &GFR Recent Labs  Lab 12/20/22 1141 12/20/22 1418 12/21/22 0426  NA 132* 134* 135  K 4.4 4.2 3.7  CL 96* 95* 96*  CO2 29 31 29   GLUCOSE 150* 209* 125*  BUN 32* 34* 32*  CREATININE 2.04* 1.92* 1.79*  CALCIUM 8.8* 9.1 8.8*   MG  --   --  2.5*   Estimated Creatinine Clearance: 68.9 mL/min (A) (by C-G formula based on SCr of 1.79 mg/dL (H)). Liver & Pancreas: Recent Labs  Lab 12/20/22 1418 12/21/22 0426  AST 52* 50*  ALT 26 26  ALKPHOS 81 77  BILITOT 3.5* 3.5*  PROT 6.8 6.5  ALBUMIN 3.1* 3.1*   No results for input(s): "LIPASE", "AMYLASE" in the last 168 hours. Recent Labs  Lab 12/20/22 1421  AMMONIA 47*   Diabetic: No results for input(s): "HGBA1C" in the last 72 hours. Recent Labs  Lab 12/21/22 0808  GLUCAP 121*  Cardiac Enzymes: No results for input(s): "CKTOTAL", "CKMB", "CKMBINDEX", "TROPONINI" in the last 168 hours. No results for input(s): "PROBNP" in the last 8760 hours. Coagulation Profile: No results for input(s): "INR", "PROTIME" in the last 168 hours. Thyroid Function Tests: No results for input(s): "TSH", "T4TOTAL", "FREET4", "T3FREE", "THYROIDAB" in the last 72 hours. Lipid Profile: No results for input(s): "CHOL", "HDL", "LDLCALC", "TRIG", "CHOLHDL", "LDLDIRECT" in the last 72 hours. Anemia Panel: No results for input(s): "VITAMINB12", "FOLATE", "FERRITIN", "TIBC", "IRON", "RETICCTPCT" in the last 72 hours. Urine analysis:    Component Value Date/Time   COLORURINE YELLOW 10/20/2022 1045   APPEARANCEUR CLEAR 10/20/2022 1045   LABSPEC 1.008 10/20/2022 1045   PHURINE 7.0 10/20/2022 1045   GLUCOSEU NEGATIVE 10/20/2022 1045   HGBUR NEGATIVE 10/20/2022 1045   BILIRUBINUR NEGATIVE 10/20/2022 1045   KETONESUR NEGATIVE 10/20/2022 1045   PROTEINUR NEGATIVE 10/20/2022 1045   NITRITE NEGATIVE 10/20/2022 1045   LEUKOCYTESUR NEGATIVE 10/20/2022 1045   Sepsis Labs: Invalid input(s): "PROCALCITONIN", "LACTICIDVEN"  Microbiology: No results found for this or any previous visit (from the past 240 hour(s)).  Radiology Studies: DG Chest 2 View  Result Date: 12/20/2022 CLINICAL DATA:  Worsening bilateral lower extremity swelling. EXAM: CHEST - 2 VIEW COMPARISON:  10/19/2022  FINDINGS: Cardiopericardial silhouette is at upper limits of normal for size. The cardiopericardial silhouette is within normal limits for size. Mild vascular congestion without edema. No pleural effusion. No acute bony abnormality. IMPRESSION: Mild vascular congestion without edema. Electronically Signed   By: Kennith Center M.D.   On: 12/20/2022 16:27      Jeanni Allshouse T. Roshawn Lacina [facility]  If 7PM-7AM, please contact night-coverage [email or web address] 12/21/2022, 12:13 PM

## 2022-12-21 NOTE — Evaluation (Signed)
Physical Therapy Evaluation Patient Details Name: Daniel Crawford MRN: [id number] DOB: 1955-04-05 Today's Date: 12/21/2022  History of Present Illness  Daniel Crawford  is a 67 y.o. male, Daniel Crawford is a 67 y.o. male with medical history significant of cirrhosis secondary to NASH, diabetes stage IIIb chronic kidney disease, diastolic heart failure, hypertension, hypothyroidism, asthma.    -Patient was sent from CHF clinic earlier today for admission for diuresis, reports significant weight gain, worsening lower extremity edema, anasarca over last few weeks, has been followed by CHF team, with his medication been adjusted frequently, he presents today CHF clinic given worsening edema, and difficulty ambulating due to that, he was recommended by cardiology CHF team Dr. Sherlie Ban for [facility] admission, patient wanted to come to any pain hospital given he lives in [place].    To go to [facility] admission, but patient reports he wants any pain given he lives in [place], patient denies any cough, chest pain or hemoptysis.   Clinical Impression  Patient demonstrates good return for getting into/out of bed and walking in room without problem.  Patient states he is going to bathroom by himself and declined walking in hallway.  Patient encouraged to ambulate ad lib in room and with nursing staff in hallways as tolerated for length of stay.  Plan:  Patient discharged from physical therapy to care of nursing for ambulation daily as tolerated for length of stay.         If plan is discharge home, recommend the following: Help with stairs or ramp for entrance   Can travel by private vehicle        Equipment Recommendations None recommended by PT  Recommendations for Other Services       Functional Status Assessment Patient has had a recent decline in their functional status and/or demonstrates limited ability to make significant improvements in function in a reasonable and predictable amount of time     Precautions /  Restrictions Precautions Precautions: None Restrictions Weight Bearing Restrictions: No      Mobility  Bed Mobility Overal bed mobility: Independent                  Transfers Overall transfer level: Modified independent                      Ambulation/Gait Ambulation/Gait assistance: Modified independent (Device/Increase time) Gait Distance (Feet): 20 Feet Assistive device: None Gait Pattern/deviations: WFL(Within Functional Limits) Gait velocity: decreased     General Gait Details: grossly WFL with good return for ambulating in room without loss of balance, declined to ambulate in hallway due to "not feeling like it" per patient  Stairs            Wheelchair Mobility     Tilt Bed    Modified Rankin (Stroke Patients Only)       Balance Overall balance assessment: Modified Independent                                           Pertinent Vitals/Pain Pain Assessment Pain Assessment: No/denies pain    Home Living Family/patient expects to be discharged to:: Private residence Living Arrangements: Alone Available Help at Discharge: Family;Available PRN/intermittently;Friend(s) Type of Home: Apartment Home Access: Level entry       Home Layout: One level Home Equipment: Rolling Walker (2 wheels);Grab bars - tub/shower;Cane - quad;Shower seat  Prior Function Prior Level of Function : Independent/Modified Independent;History of Falls (last six months)             Mobility Comments: Leans on walls within the home; quad cane used when outside. ADLs Comments: Independent     Extremity/Trunk Assessment   Upper Extremity Assessment Upper Extremity Assessment: Overall WFL for tasks assessed    Lower Extremity Assessment Lower Extremity Assessment: Overall WFL for tasks assessed    Cervical / Trunk Assessment Cervical / Trunk Assessment: Normal  Communication   Communication Communication: No apparent  difficulties  Cognition Arousal: Alert Behavior During Therapy: WFL for tasks assessed/performed Overall Cognitive Status: Within Functional Limits for tasks assessed                                          General Comments      Exercises     Assessment/Plan    PT Assessment Patient does not need any further PT services  PT Problem List         PT Treatment Interventions      PT Goals (Current goals can be found in the Care Plan section)  Acute Rehab PT Goals Patient Stated Goal: return home PT Goal Formulation: With patient Time For Goal Achievement: 12/21/22 Potential to Achieve Goals: Good    Frequency       Co-evaluation               AM-PAC PT "6 Clicks" Mobility  Outcome Measure Help needed turning from your back to your side while in a flat bed without using bedrails?: None Help needed moving from lying on your back to sitting on the side of a flat bed without using bedrails?: None Help needed moving to and from a bed to a chair (including a wheelchair)?: None Help needed standing up from a chair using your arms (e.g., wheelchair or bedside chair)?: None Help needed to walk in hospital room?: None Help needed climbing 3-5 steps with a railing? : A Little 6 Click Score: 23    End of Session   Activity Tolerance: Patient tolerated treatment well;Patient limited by fatigue Patient left: in bed;with call bell/phone within reach Nurse Communication: Mobility status PT Visit Diagnosis: Unsteadiness on feet (R26.81);Other abnormalities of gait and mobility (R26.89);Muscle weakness (generalized) (M62.81)    Time: 9629-5284 PT Time Calculation (min) (ACUTE ONLY): 18 min   Charges:   PT Evaluation $PT Eval Low Complexity: 1 Low PT Treatments $Therapeutic Activity: 8-22 mins PT General Charges $$ ACUTE PT VISIT: 1 Visit         3:03 PM, 12/21/22 Ocie Bob, MPT Physical Therapist with [facility] 336  [phone] office [phone] mobile phone

## 2022-12-21 NOTE — Telephone Encounter (Signed)
CSW sent updates to [facility] team and informed them of patient admission.   Will continue to follow through HF Clinic and assist as needed  Burna Sis, LCSW Clinical Social Worker Advanced [facility] Failure Clinic Desk#: [phone] Cell#: [phone]

## 2022-12-22 DIAGNOSIS — N1831 Chronic kidney disease, stage 3a: Secondary | ICD-10-CM | POA: Diagnosis not present

## 2022-12-22 DIAGNOSIS — I5033 Acute on chronic diastolic (congestive) heart failure: Secondary | ICD-10-CM | POA: Diagnosis not present

## 2022-12-22 DIAGNOSIS — K7460 Unspecified cirrhosis of liver: Secondary | ICD-10-CM | POA: Diagnosis not present

## 2022-12-22 LAB — CBC
HCT: 29.4 % — ABNORMAL LOW (ref 39.0–52.0)
Hemoglobin: 9.2 g/dL — ABNORMAL LOW (ref 13.0–17.0)
MCH: 28.5 pg (ref 26.0–34.0)
MCHC: 31.3 g/dL (ref 30.0–36.0)
MCV: 91 fL (ref 80.0–100.0)
Platelets: 56 10*3/uL — ABNORMAL LOW (ref 150–400)
RBC: 3.23 MIL/uL — ABNORMAL LOW (ref 4.22–5.81)
RDW: 15.7 % — ABNORMAL HIGH (ref 11.5–15.5)
WBC: 3.4 10*3/uL — ABNORMAL LOW (ref 4.0–10.5)
nRBC: 0 % (ref 0.0–0.2)

## 2022-12-22 LAB — RENAL FUNCTION PANEL
Albumin: 3 g/dL — ABNORMAL LOW (ref 3.5–5.0)
Anion gap: 11 (ref 5–15)
BUN: 30 mg/dL — ABNORMAL HIGH (ref 8–23)
CO2: 29 mmol/L (ref 22–32)
Calcium: 9 mg/dL (ref 8.9–10.3)
Chloride: 96 mmol/L — ABNORMAL LOW (ref 98–111)
Creatinine, Ser: 1.76 mg/dL — ABNORMAL HIGH (ref 0.61–1.24)
GFR, Estimated: 43 mL/min — ABNORMAL LOW (ref >60)
Glucose, Bld: 118 mg/dL — ABNORMAL HIGH (ref 70–99)
Phosphorus: 4 mg/dL (ref 2.5–4.6)
Potassium: 3.9 mmol/L (ref 3.5–5.1)
Sodium: 136 mmol/L (ref 135–145)

## 2022-12-22 LAB — MAGNESIUM: Magnesium: 2.5 mg/dL — ABNORMAL HIGH (ref 1.7–2.4)

## 2022-12-22 LAB — GLUCOSE, CAPILLARY
Glucose-Capillary: 133 mg/dL — ABNORMAL HIGH (ref 70–99)
Glucose-Capillary: 149 mg/dL — ABNORMAL HIGH (ref 70–99)
Glucose-Capillary: 131 mg/dL — ABNORMAL HIGH (ref 70–99)

## 2022-12-22 MED ORDER — SERTRALINE HCL 50 MG PO TABS
50.0000 mg | ORAL_TABLET | Freq: Once | ORAL | Status: AC
  Administered 2022-12-22: 50 mg via ORAL
  Filled 2022-12-22: qty 1

## 2022-12-22 NOTE — Progress Notes (Signed)
PROGRESS NOTE  Daniel Crawford QIO:[id number] DOB: 12/22/1955   PCP: [name], MD  Patient is from: Home.  Lives alone.  Uses cane for ambulation.  DOA: 12/20/2022 LOS: 2  Chief complaints Chief Complaint  Patient presents with   Leg Swelling     Brief Narrative / Interim history: 68 year old M with PMH of NASH cirrhosis, diastolic CHF, HTN, hypothyroidism, asthma and CKD-3B sent to ED from CHF clinic for IV diuretics due to weight gain, worsening edema and anasarca over the last few weeks.  Patient was advised to go to [facility] but decided to come to [facility] which is close to his home.  CXR consistent with CHF.  Patient was started on IV Lasix 80 mg twice daily.   The next day, Lasix increased to 80 mg 3 times daily.    Subjective: Seen and examined earlier this morning.  No major events overnight of this morning.  Reports feeling down.  He says he feels this way sometimes due to inability to get things done the way he used to.  He has history of depression but he stats his feeling is due to physical limitation from his CHF.  He says he has good and bad days.  He denies suicidal or homicidal ideation.  Objective: Vitals:   12/21/22 2212 12/22/22 0600 12/22/22 0932 12/22/22 1222  BP: (!) 118/54 136/72 (!) 125/57 (!) 107/55  Pulse: 74 80 70 72  Resp: 16 17 19 20   Temp: 98.4 F (36.9 C) 98 F (36.7 C) 98.1 F (36.7 C) 98.8 F (37.1 C)  TempSrc: Oral  Oral Oral  SpO2: 99% 96% 98% 98%  Weight:  (!) 170.4 kg    Height:        Examination:  GENERAL: No apparent distress.  Nontoxic. HEENT: MMM.  Vision and hearing grossly intact.  NECK: Supple.  No apparent JVD.  RESP:  No IWOB.  Fair aeration bilaterally. CVS:  RRR. Heart sounds normal.  ABD/GI/GU: BS+. Abd soft, NTND.  MSK/EXT:  Moves extremities. No apparent deformity.  2+ BLE edema but difficult due to body habitus SKIN: no apparent skin lesion or wound NEURO: Awake, alert and oriented appropriately.  No  apparent focal neuro deficit. PSYCH: Calm. Normal affect.   Procedures:  None  Microbiology summarized: None  Assessment and plan: Principal Problem:   Acute on chronic diastolic CHF (congestive heart failure) (HCC) Active Problems:   Cirrhosis of liver with ascites (HCC)   Essential hypertension   CKD stage 3a, GFR 45-59 ml/min (HCC)   Acquired hypothyroidism   Thrombocytopenia (HCC)   Depression with anxiety   Anasarca   GERD (gastroesophageal reflux disease)   Acute exacerbation of congestive heart failure (HCC)   NASH (nonalcoholic steatohepatitis)   Class 3 severe obesity in adult [facility])   Cellulitis and abscess of left leg  Acute on chronic diastolic CHF/anasarca/BLE edema: Seen to ED from CHF clinic due to anasarca/BLE edema.  TTE in 06/2022 with LVEF of 55%, normal RVSP.  BNP 50.8.  CXR concerning for CHF.  Started on IV Lasix 80 mg twice daily.  About 2.8 L UOP/24 hours.  Creatinine improved.  BP stable. -Continue IV Lasix to 80 mg 3 times daily -Continue Aldactone 100 mg daily -Continue Farxiga. -Strict intake and output, daily weight, renal functions and electrolytes -Per admitting MD, Dr. Jearld Pies recommended torsemide 60 mg twice daily with metolazone 2.5 mg once or twice a week on discharge and outpatient follow-up in 3 weeks  NASH cirrhosis/Stage IV by liver biopsy, likely due to NASH History of hepatic encephalopathy/hyperammonemia Splenomegaly with thrombocytopenia -Continue to monitor LFTs closely -Continue with rifaximin and lactulose -Continue with diuresis and Aldactone   Thrombocytopenia/pancytopenia: Due to liver cirrhosis and splenomegaly, chronic, stable at baseline -Continue monitoring   Chronic COPD: Stable.  He is no longer smokes   OSA: Does not want to use CPAP   Neuropathy -Continue with gabapentin   GERD -Continue with PPI    Depression-anxiety: Endorses depressed mood at times due to his health decline.  No SI/HI or  psychosis. -Continue with Zoloft and hydroxyzine   Possible LLE cellulitis: -Continue doxycycline  Morbid obesity Body mass index is 49.56 kg/m. -Encourage lifestyle change to lose weight         DVT prophylaxis:  SCDs Start: 12/20/22 2201 due to thrombocytopenia  Code Status: Full code Family Communication: None at bedside Level of care: Telemetry Status is: Inpatient Remains inpatient appropriate because: Acute diastolic CHF   Final disposition: Home Consultants:  Cardiology  55 minutes with more than 50% spent in reviewing records, counseling patient/family and coordinating care.   Sch Meds:  Scheduled Meds:  carvedilol  6.25 mg Oral BID WC   dapagliflozin propanediol  10 mg Oral QAC breakfast   doxycycline  100 mg Oral Q12H   furosemide  80 mg Intravenous TID   gabapentin  200 mg Oral QHS   insulin aspart  0-9 Units Subcutaneous TID WC   lactulose  20 g Oral BID   levothyroxine  175 mcg Oral QAC breakfast   pantoprazole  40 mg Oral Daily   polyethylene glycol  17 g Oral Daily   potassium chloride SA  40 mEq Oral BID   rifaximin  550 mg Oral BID   sertraline  100 mg Oral Daily   spironolactone  100 mg Oral Daily   traZODone  50 mg Oral QHS   Continuous Infusions: PRN Meds:.acetaminophen, hydrOXYzine  Antimicrobials: Anti-infectives (From admission, onward)    Start     Dose/Rate Route Frequency Ordered Stop   12/20/22 2215  rifaximin (XIFAXAN) tablet 550 mg        550 mg Oral 2 times daily 12/20/22 2200     12/20/22 2200  doxycycline (VIBRA-TABS) tablet 100 mg        100 mg Oral Every 12 hours 12/20/22 2153          I have personally reviewed the following labs and images: CBC: Recent Labs  Lab 12/20/22 1141 12/20/22 1418 12/21/22 0426 12/22/22 0443  WBC 3.8* 3.5* 3.7* 3.4*  NEUTROABS  --  2.2  --   --   HGB 9.8* 9.7* 9.0* 9.2*  HCT 29.7* 30.6* 28.4* 29.4*  MCV 89.7 91.6 90.4 91.0  PLT 63* 66* 58* 56*   BMP &GFR Recent Labs  Lab  12/20/22 1141 12/20/22 1418 12/21/22 0426 12/22/22 0443  NA 132* 134* 135 136  K 4.4 4.2 3.7 3.9  CL 96* 95* 96* 96*  CO2 29 31 29 29   GLUCOSE 150* 209* 125* 118*  BUN 32* 34* 32* 30*  CREATININE 2.04* 1.92* 1.79* 1.76*  CALCIUM 8.8* 9.1 8.8* 9.0  MG  --   --  2.5* 2.5*  PHOS  --   --   --  4.0   Estimated Creatinine Clearance: 69.6 mL/min (A) (by C-G formula based on SCr of 1.76 mg/dL (H)). Liver & Pancreas: Recent Labs  Lab 12/20/22 1418 12/21/22 0426 12/22/22 0443  AST 52* 50*  --  ALT 26 26  --   ALKPHOS 81 77  --   BILITOT 3.5* 3.5*  --   PROT 6.8 6.5  --   ALBUMIN 3.1* 3.1* 3.0*   No results for input(s): "LIPASE", "AMYLASE" in the last 168 hours. Recent Labs  Lab 12/20/22 1421  AMMONIA 47*   Diabetic: No results for input(s): "HGBA1C" in the last 72 hours. Recent Labs  Lab 12/21/22 1215 12/21/22 1638 12/21/22 2217 12/22/22 0742 12/22/22 1108  GLUCAP 137* 127* 149* 133* 149*   Cardiac Enzymes: No results for input(s): "CKTOTAL", "CKMB", "CKMBINDEX", "TROPONINI" in the last 168 hours. No results for input(s): "PROBNP" in the last 8760 hours. Coagulation Profile: No results for input(s): "INR", "PROTIME" in the last 168 hours. Thyroid Function Tests: No results for input(s): "TSH", "T4TOTAL", "FREET4", "T3FREE", "THYROIDAB" in the last 72 hours. Lipid Profile: No results for input(s): "CHOL", "HDL", "LDLCALC", "TRIG", "CHOLHDL", "LDLDIRECT" in the last 72 hours. Anemia Panel: No results for input(s): "VITAMINB12", "FOLATE", "FERRITIN", "TIBC", "IRON", "RETICCTPCT" in the last 72 hours. Urine analysis:    Component Value Date/Time   COLORURINE YELLOW 10/20/2022 1045   APPEARANCEUR CLEAR 10/20/2022 1045   LABSPEC 1.008 10/20/2022 1045   PHURINE 7.0 10/20/2022 1045   GLUCOSEU NEGATIVE 10/20/2022 1045   HGBUR NEGATIVE 10/20/2022 1045   BILIRUBINUR NEGATIVE 10/20/2022 1045   KETONESUR NEGATIVE 10/20/2022 1045   PROTEINUR NEGATIVE 10/20/2022 1045    NITRITE NEGATIVE 10/20/2022 1045   LEUKOCYTESUR NEGATIVE 10/20/2022 1045   Sepsis Labs: Invalid input(s): "PROCALCITONIN", "LACTICIDVEN"  Microbiology: No results found for this or any previous visit (from the past 240 hour(s)).  Radiology Studies: No results found.    [place] Severs T. Alyus Mofield [facility]  If 7PM-7AM, please contact night-coverage [email or web address] 12/22/2022, 3:33 PM

## 2022-12-22 NOTE — Plan of Care (Signed)
  Problem: Education: Goal: Understanding of CV disease, CV risk reduction, and recovery process will improve Outcome: Progressing   Problem: Activity: Goal: Ability to return to baseline activity level will improve Outcome: Progressing   Problem: Cardiovascular: Goal: Ability to achieve and maintain adequate cardiovascular perfusion will improve Outcome: Progressing   Problem: Education: Goal: Ability to demonstrate management of disease process will improve Outcome: Progressing

## 2022-12-22 NOTE — Plan of Care (Signed)
  Problem: Activity: Goal: Ability to return to baseline activity level will improve Outcome: Progressing   

## 2022-12-23 DIAGNOSIS — I5033 Acute on chronic diastolic (congestive) heart failure: Secondary | ICD-10-CM | POA: Diagnosis not present

## 2022-12-23 DIAGNOSIS — N1831 Chronic kidney disease, stage 3a: Secondary | ICD-10-CM | POA: Diagnosis not present

## 2022-12-23 DIAGNOSIS — K7460 Unspecified cirrhosis of liver: Secondary | ICD-10-CM | POA: Diagnosis not present

## 2022-12-23 LAB — CBC
HCT: 28.6 % — ABNORMAL LOW (ref 39.0–52.0)
Hemoglobin: 8.9 g/dL — ABNORMAL LOW (ref 13.0–17.0)
MCH: 28.7 pg (ref 26.0–34.0)
MCHC: 31.1 g/dL (ref 30.0–36.0)
MCV: 92.3 fL (ref 80.0–100.0)
Platelets: 55 10*3/uL — ABNORMAL LOW (ref 150–400)
RBC: 3.1 MIL/uL — ABNORMAL LOW (ref 4.22–5.81)
RDW: 15.7 % — ABNORMAL HIGH (ref 11.5–15.5)
WBC: 3.2 10*3/uL — ABNORMAL LOW (ref 4.0–10.5)
nRBC: 0 % (ref 0.0–0.2)

## 2022-12-23 LAB — RENAL FUNCTION PANEL
Albumin: 3 g/dL — ABNORMAL LOW (ref 3.5–5.0)
Anion gap: 11 (ref 5–15)
BUN: 29 mg/dL — ABNORMAL HIGH (ref 8–23)
CO2: 27 mmol/L (ref 22–32)
Calcium: 8.7 mg/dL — ABNORMAL LOW (ref 8.9–10.3)
Chloride: 95 mmol/L — ABNORMAL LOW (ref 98–111)
Creatinine, Ser: 1.73 mg/dL — ABNORMAL HIGH (ref 0.61–1.24)
GFR, Estimated: 44 mL/min — ABNORMAL LOW (ref >60)
Glucose, Bld: 143 mg/dL — ABNORMAL HIGH (ref 70–99)
Phosphorus: 3.8 mg/dL (ref 2.5–4.6)
Potassium: 3.7 mmol/L (ref 3.5–5.1)
Sodium: 133 mmol/L — ABNORMAL LOW (ref 135–145)

## 2022-12-23 LAB — GLUCOSE, CAPILLARY
Glucose-Capillary: 138 mg/dL — ABNORMAL HIGH (ref 70–99)
Glucose-Capillary: 96 mg/dL (ref 70–99)
Glucose-Capillary: 114 mg/dL — ABNORMAL HIGH (ref 70–99)
Glucose-Capillary: 84 mg/dL (ref 70–99)

## 2022-12-23 LAB — MAGNESIUM: Magnesium: 2.4 mg/dL (ref 1.7–2.4)

## 2022-12-23 NOTE — Plan of Care (Signed)
  Problem: Education: Goal: Understanding of CV disease, CV risk reduction, and recovery process will improve Outcome: Progressing Goal: Individualized Educational Video(s) Outcome: Progressing   Problem: Activity: Goal: Ability to return to baseline activity level will improve Outcome: Progressing   Problem: Cardiovascular: Goal: Ability to achieve and maintain adequate cardiovascular perfusion will improve Outcome: Progressing Goal: Vascular access site(s) Level 0-1 will be maintained Outcome: Progressing   Problem: Health Behavior/Discharge Planning: Goal: Ability to safely manage health-related needs after discharge will improve Outcome: Progressing   Problem: Education: Goal: Ability to demonstrate management of disease process will improve Outcome: Progressing Goal: Ability to verbalize understanding of medication therapies will improve Outcome: Progressing Goal: Individualized Educational Video(s) Outcome: Progressing   Problem: Activity: Goal: Capacity to carry out activities will improve Outcome: Progressing   Problem: Cardiac: Goal: Ability to achieve and maintain adequate cardiopulmonary perfusion will improve Outcome: Progressing   Problem: Education: Goal: Ability to describe self-care measures that  prevent or decrease complications (Diabetes Survival Skills Education) will improve Outcome: Progressing Goal: Individualized Educational Video(s) Outcome: Progressing   Problem: Coping: Goal: Ability to adjust to condition or change in health will improve Outcome: Progressing   Problem: Fluid Volume: Goal: Ability to maintain a balanced intake and output will improve Outcome: Progressing   Problem: Health Behavior/Discharge Planning: Goal: Ability to identify and utilize available resources and services will improve Outcome: Progressing Goal: Ability to manage health-related needs will improve Outcome: Progressing   Problem: Metabolic: Goal:  Ability to maintain appropriate glucose levels will improve Outcome: Progressing   Problem: Nutritional: Goal: Maintenance of adequate nutrition will improve Outcome: Progressing Goal: Progress toward achieving an optimal weight will improve Outcome: Progressing   Problem: Skin Integrity: Goal: Risk for impaired skin integrity will decrease Outcome: Progressing   Problem: Tissue Perfusion: Goal: Adequacy of tissue perfusion will improve Outcome: Progressing   Problem: Education: Goal: Knowledge of General Education information will improve Description: Including pain rating scale, medication(s)/side effects and non-pharmacologic comfort measures Outcome: Progressing   Problem: Health Behavior/Discharge Planning: Goal: Ability to manage health-related needs will improve Outcome: Progressing   Problem: Clinical Measurements: Goal: Ability to maintain clinical measurements within normal limits will improve Outcome: Progressing Goal: Will remain free from infection Outcome: Progressing Goal: Diagnostic test results will improve Outcome: Progressing Goal: Respiratory complications will improve Outcome: Progressing Goal: Cardiovascular complication will be avoided Outcome: Progressing   Problem: Activity: Goal: Risk for activity intolerance will decrease Outcome: Progressing   Problem: Nutrition: Goal: Adequate nutrition will be maintained Outcome: Progressing   Problem: Coping: Goal: Level of anxiety will decrease Outcome: Progressing   Problem: Elimination: Goal: Will not experience complications related to bowel motility Outcome: Progressing Goal: Will not experience complications related to urinary retention Outcome: Progressing   Problem: Pain Managment: Goal: General experience of comfort will improve Outcome: Progressing   Problem: Safety: Goal: Ability to remain free from injury will improve Outcome: Progressing   Problem: Skin Integrity: Goal: Risk  for impaired skin integrity will decrease Outcome: Progressing

## 2022-12-23 NOTE — Progress Notes (Addendum)
PROGRESS NOTE  Daniel Crawford ZOX:[id number] DOB: 08-Mar-1956   PCP: [name], MD  Patient is from: Home.  Lives alone.  Uses cane for ambulation.  DOA: 12/20/2022 LOS: 3  Chief complaints Chief Complaint  Patient presents with   Leg Swelling     Brief Narrative / Interim history: 67 year old M with PMH of NASH cirrhosis, diastolic CHF, HTN, hypothyroidism, asthma and CKD-3B sent to ED from CHF clinic for IV diuretics due to weight gain, worsening edema and anasarca over the last few weeks.  Patient was advised to go to [facility] but decided to come to [facility] which is close to his home.  CXR consistent with CHF.  Patient was started on IV Lasix 80 mg twice daily.   The next day, Lasix increased to 80 mg 3 times daily.  Edema slowly improving.    Subjective: Seen and examined earlier this morning.  No major events overnight of this morning.  No complaints.  Reports improvement in edema.  Denies chest pain, shortness of breath, orthopnea or PND.  Reports regular bowel movements. Objective: Vitals:   12/22/22 1222 12/22/22 1740 12/23/22 0617 12/23/22 1227  BP: (!) 107/55 121/63 139/68 (!) 115/52  Pulse: 72  71 73  Resp: 20  15 20   Temp: 98.8 F (37.1 C)  97.6 F (36.4 C) 98.3 F (36.8 C)  TempSrc: Oral   Oral  SpO2: 98%  99% 100%  Weight:   (!) 169.6 kg   Height:        Examination:  GENERAL: No apparent distress.  Nontoxic. HEENT: MMM.  Vision and hearing grossly intact.  NECK: Supple.  Call to assess JVD. RESP:  No IWOB.  Fair aeration bilaterally but limited exam due to body habitus. CVS:  RRR. Heart sounds normal.  ABD/GI/GU: BS+. Abd soft, NTND.  MSK/EXT:  Moves extremities. No apparent deformity.   2+ BLE edema but difficult due to poor diabetes. SKIN: no apparent skin lesion or wound NEURO: Awake, alert and oriented appropriately.  No apparent focal neuro deficit. PSYCH: Calm. Normal affect.  In bright mood today  Procedures:  None  Microbiology  summarized: None  Assessment and plan: Principal Problem:   Acute on chronic diastolic CHF (congestive heart failure) (HCC) Active Problems:   Cirrhosis of liver with ascites (HCC)   Essential hypertension   CKD stage 3a, GFR 45-59 ml/min (HCC)   Acquired hypothyroidism   Thrombocytopenia (HCC)   Depression with anxiety   Anasarca   GERD (gastroesophageal reflux disease)   Acute exacerbation of congestive heart failure (HCC)   NASH (nonalcoholic steatohepatitis)   Class 3 severe obesity in adult [facility])   Cellulitis and abscess of left leg  Acute on chronic diastolic CHF/anasarca/BLE edema: Seen to ED from CHF clinic due to anasarca/BLE edema.  TTE in 06/2022 with LVEF of 55%, normal RVSP.  BNP 50.8.  CXR concerning for CHF.  Started on IV Lasix 80 mg twice daily.  About 3.6 L UOP/24 hours.  Cr improved.  BP stable. -Continue IV Lasix to 80 mg 3 times daily as long as BP and renal function allows -Continue Aldactone Aldactone and Farxiga. -Continue potassium supplementation -Strict intake and output, daily weight, renal functions and electrolytes -Per admitting MD, Dr. Jearld Pies recommended torsemide 60 mg twice daily with metolazone 2.5 mg once or twice a week on discharge and outpatient follow-up in 3 weeks    NASH cirrhosis/Stage IV by liver biopsy, likely due to NASH History of hepatic encephalopathy/hyperammonemia Splenomegaly with  thrombocytopenia -Continue to monitor LFTs closely -Continue with rifaximin and lactulose -Continue with diuresis and Aldactone   Thrombocytopenia/pancytopenia: Due to liver cirrhosis and splenomegaly, chronic, stable at baseline -Continue monitoring   Chronic COPD: Stable.  He is no longer smokes   OSA: Does not want to use CPAP   Neuropathy -Continue with gabapentin   GERD -Continue with PPI    Depression-anxiety: Stable.  -Continue with Zoloft and hydroxyzine   Possible LLE cellulitis: -Continue doxycycline  Morbid obesity Body  mass index is 49.33 kg/m. -Encourage lifestyle change to lose weight         DVT prophylaxis:  SCDs Start: 12/20/22 2201 due to thrombocytopenia  Code Status: Full code Family Communication: None at bedside Level of care: Telemetry Status is: Inpatient Remains inpatient appropriate because: Acute diastolic CHF   Final disposition: Home Consultants:  Cardiology  35 minutes with more than 50% spent in reviewing records, counseling patient/family and coordinating care.   Sch Meds:  Scheduled Meds:  carvedilol  6.25 mg Oral BID WC   dapagliflozin propanediol  10 mg Oral QAC breakfast   doxycycline  100 mg Oral Q12H   furosemide  80 mg Intravenous TID   gabapentin  200 mg Oral QHS   insulin aspart  0-9 Units Subcutaneous TID WC   lactulose  20 g Oral BID   levothyroxine  175 mcg Oral QAC breakfast   pantoprazole  40 mg Oral Daily   polyethylene glycol  17 g Oral Daily   potassium chloride SA  40 mEq Oral BID   rifaximin  550 mg Oral BID   sertraline  100 mg Oral Daily   spironolactone  100 mg Oral Daily   traZODone  50 mg Oral QHS   Continuous Infusions: PRN Meds:.acetaminophen, hydrOXYzine  Antimicrobials: Anti-infectives (From admission, onward)    Start     Dose/Rate Route Frequency Ordered Stop   12/20/22 2215  rifaximin (XIFAXAN) tablet 550 mg        550 mg Oral 2 times daily 12/20/22 2200     12/20/22 2200  doxycycline (VIBRA-TABS) tablet 100 mg        100 mg Oral Every 12 hours 12/20/22 2153          I have personally reviewed the following labs and images: CBC: Recent Labs  Lab 12/20/22 1141 12/20/22 1418 12/21/22 0426 12/22/22 0443 12/23/22 0821  WBC 3.8* 3.5* 3.7* 3.4* 3.2*  NEUTROABS  --  2.2  --   --   --   HGB 9.8* 9.7* 9.0* 9.2* 8.9*  HCT 29.7* 30.6* 28.4* 29.4* 28.6*  MCV 89.7 91.6 90.4 91.0 92.3  PLT 63* 66* 58* 56* 55*   BMP &GFR Recent Labs  Lab 12/20/22 1141 12/20/22 1418 12/21/22 0426 12/22/22 0443 12/23/22 0720  NA 132*  134* 135 136 133*  K 4.4 4.2 3.7 3.9 3.7  CL 96* 95* 96* 96* 95*  CO2 29 31 29 29 27   GLUCOSE 150* 209* 125* 118* 143*  BUN 32* 34* 32* 30* 29*  CREATININE 2.04* 1.92* 1.79* 1.76* 1.73*  CALCIUM 8.8* 9.1 8.8* 9.0 8.7*  MG  --   --  2.5* 2.5* 2.4  PHOS  --   --   --  4.0 3.8   Estimated Creatinine Clearance: 70.7 mL/min (A) (by C-G formula based on SCr of 1.73 mg/dL (H)). Liver & Pancreas: Recent Labs  Lab 12/20/22 1418 12/21/22 0426 12/22/22 0443 12/23/22 0720  AST 52* 50*  --   --  ALT 26 26  --   --   ALKPHOS 81 77  --   --   BILITOT 3.5* 3.5*  --   --   PROT 6.8 6.5  --   --   ALBUMIN 3.1* 3.1* 3.0* 3.0*   No results for input(s): "LIPASE", "AMYLASE" in the last 168 hours. Recent Labs  Lab 12/20/22 1421  AMMONIA 47*   Diabetic: No results for input(s): "HGBA1C" in the last 72 hours. Recent Labs  Lab 12/22/22 0742 12/22/22 1108 12/22/22 1645 12/23/22 0731 12/23/22 1151  GLUCAP 133* 149* 131* 138* 96   Cardiac Enzymes: No results for input(s): "CKTOTAL", "CKMB", "CKMBINDEX", "TROPONINI" in the last 168 hours. No results for input(s): "PROBNP" in the last 8760 hours. Coagulation Profile: No results for input(s): "INR", "PROTIME" in the last 168 hours. Thyroid Function Tests: No results for input(s): "TSH", "T4TOTAL", "FREET4", "T3FREE", "THYROIDAB" in the last 72 hours. Lipid Profile: No results for input(s): "CHOL", "HDL", "LDLCALC", "TRIG", "CHOLHDL", "LDLDIRECT" in the last 72 hours. Anemia Panel: No results for input(s): "VITAMINB12", "FOLATE", "FERRITIN", "TIBC", "IRON", "RETICCTPCT" in the last 72 hours. Urine analysis:    Component Value Date/Time   COLORURINE YELLOW 10/20/2022 1045   APPEARANCEUR CLEAR 10/20/2022 1045   LABSPEC 1.008 10/20/2022 1045   PHURINE 7.0 10/20/2022 1045   GLUCOSEU NEGATIVE 10/20/2022 1045   HGBUR NEGATIVE 10/20/2022 1045   BILIRUBINUR NEGATIVE 10/20/2022 1045   KETONESUR NEGATIVE 10/20/2022 1045   PROTEINUR NEGATIVE  10/20/2022 1045   NITRITE NEGATIVE 10/20/2022 1045   LEUKOCYTESUR NEGATIVE 10/20/2022 1045   Sepsis Labs: Invalid input(s): "PROCALCITONIN", "LACTICIDVEN"  Microbiology: No results found for this or any previous visit (from the past 240 hour(s)).  Radiology Studies: No results found.    Daniel Crawford T. Daneil Beem [facility]  If 7PM-7AM, please contact night-coverage [email or web address] 12/23/2022, 12:53 PM

## 2022-12-23 NOTE — Plan of Care (Signed)
  Problem: Cardiovascular: Goal: Ability to achieve and maintain adequate cardiovascular perfusion will improve Outcome: Progressing   Problem: Education: Goal: Ability to demonstrate management of disease process will improve Outcome: Progressing

## 2022-12-24 DIAGNOSIS — I5033 Acute on chronic diastolic (congestive) heart failure: Secondary | ICD-10-CM | POA: Diagnosis not present

## 2022-12-24 LAB — GLUCOSE, CAPILLARY
Glucose-Capillary: 111 mg/dL — ABNORMAL HIGH (ref 70–99)
Glucose-Capillary: 109 mg/dL — ABNORMAL HIGH (ref 70–99)
Glucose-Capillary: 131 mg/dL — ABNORMAL HIGH (ref 70–99)
Glucose-Capillary: 134 mg/dL — ABNORMAL HIGH (ref 70–99)

## 2022-12-24 LAB — CBC
HCT: 30.2 % — ABNORMAL LOW (ref 39.0–52.0)
Hemoglobin: 9.5 g/dL — ABNORMAL LOW (ref 13.0–17.0)
MCH: 28.9 pg (ref 26.0–34.0)
MCHC: 31.5 g/dL (ref 30.0–36.0)
MCV: 91.8 fL (ref 80.0–100.0)
Platelets: 58 10*3/uL — ABNORMAL LOW (ref 150–400)
RBC: 3.29 MIL/uL — ABNORMAL LOW (ref 4.22–5.81)
RDW: 15.6 % — ABNORMAL HIGH (ref 11.5–15.5)
WBC: 3.2 10*3/uL — ABNORMAL LOW (ref 4.0–10.5)
nRBC: 0 % (ref 0.0–0.2)

## 2022-12-24 LAB — RENAL FUNCTION PANEL
Albumin: 3.1 g/dL — ABNORMAL LOW (ref 3.5–5.0)
Anion gap: 13 (ref 5–15)
BUN: 29 mg/dL — ABNORMAL HIGH (ref 8–23)
CO2: 27 mmol/L (ref 22–32)
Calcium: 8.7 mg/dL — ABNORMAL LOW (ref 8.9–10.3)
Chloride: 94 mmol/L — ABNORMAL LOW (ref 98–111)
Creatinine, Ser: 1.78 mg/dL — ABNORMAL HIGH (ref 0.61–1.24)
GFR, Estimated: 42 mL/min — ABNORMAL LOW (ref >60)
Glucose, Bld: 113 mg/dL — ABNORMAL HIGH (ref 70–99)
Phosphorus: 4 mg/dL (ref 2.5–4.6)
Potassium: 4.1 mmol/L (ref 3.5–5.1)
Sodium: 134 mmol/L — ABNORMAL LOW (ref 135–145)

## 2022-12-24 LAB — MAGNESIUM: Magnesium: 2.4 mg/dL (ref 1.7–2.4)

## 2022-12-24 NOTE — Progress Notes (Addendum)
Progress Note  Patient Name: Daniel Crawford Date of Encounter: 12/24/2022  Primary Cardiologist: Nona Dell, MD  Subjective   Reports gradual improvement in SOB, edema Still diuresing briskly Having IV access issues, accidentally pulled out IV while giving himself a bath  Inpatient Medications    Scheduled Meds:  carvedilol  6.25 mg Oral BID WC   dapagliflozin propanediol  10 mg Oral QAC breakfast   doxycycline  100 mg Oral Q12H   furosemide  80 mg Intravenous TID   gabapentin  200 mg Oral QHS   insulin aspart  0-9 Units Subcutaneous TID WC   lactulose  20 g Oral BID   levothyroxine  175 mcg Oral QAC breakfast   pantoprazole  40 mg Oral Daily   polyethylene glycol  17 g Oral Daily   potassium chloride SA  40 mEq Oral BID   rifaximin  550 mg Oral BID   sertraline  100 mg Oral Daily   spironolactone  100 mg Oral Daily   traZODone  50 mg Oral QHS    PRN Meds: acetaminophen, hydrOXYzine   Vital Signs    Vitals:   12/23/22 1227 12/23/22 2045 12/24/22 0351 12/24/22 0500  BP: (!) 115/52 130/65 134/65   Pulse: 73 65 71   Resp: 20 20 20    Temp: 98.3 F (36.8 C) 99.1 F (37.3 C) 98.2 F (36.8 C)   TempSrc: Oral  Oral   SpO2: 100% 100% 99%   Weight:    (!) 169.6 kg  Height:        Intake/Output Summary (Last 24 hours) at 12/24/2022 0953 Last data filed at 12/24/2022 1610 Gross per 24 hour  Intake 480 ml  Output 5125 ml  Net -4645 ml      12/24/2022    5:00 AM 12/23/2022    6:17 AM 12/22/2022    6:00 AM  Last 3 Weights  Weight (lbs) 373 lb 14.4 oz 373 lb 14.4 oz 375 lb 10.6 oz  Weight (kg) 169.6 kg 169.6 kg 170.4 kg     Telemetry    NSR, brief run of NSVT vs a-tach with abberrancy (6 beats) yesterday, none today - Personally Reviewed  Physical Exam   GEN: No acute distress.  HEENT: Normocephalic, atraumatic, sclera non-icteric. Neck: Elev JVP, no bruits. Cardiac: RRR no murmurs, rubs, or gallops.  Respiratory: Clear to auscultation bilaterally. Breathing  is unlabored. GI: Soft, nontender, non-distended, BS +x 4. MS: no deformity. Extremities: No clubbing or cyanosis. 1-2+ BLE edema. Distal pedal pulses are 2+ and equal bilaterally. Neuro:  AAOx3. Follows commands. Psych:  Responds to questions appropriately with a normal affect.  Labs   Chemistry Recent Labs  Lab 12/20/22 1418 12/21/22 0426 12/22/22 0443 12/23/22 0720 12/24/22 0522  NA 134* 135 136 133* 134*  K 4.2 3.7 3.9 3.7 4.1  CL 95* 96* 96* 95* 94*  CO2 31 29 29 27 27   GLUCOSE 209* 125* 118* 143* 113*  BUN 34* 32* 30* 29* 29*  CREATININE 1.92* 1.79* 1.76* 1.73* 1.78*  CALCIUM 9.1 8.8* 9.0 8.7* 8.7*  PROT 6.8 6.5  --   --   --   ALBUMIN 3.1* 3.1* 3.0* 3.0* 3.1*  AST 52* 50*  --   --   --   ALT 26 26  --   --   --   ALKPHOS 81 77  --   --   --   BILITOT 3.5* 3.5*  --   --   --   [facility]  38* 42* 43* 44* 42*  ANIONGAP 8 10 11 11 13      Hematology Recent Labs  Lab 12/22/22 0443 12/23/22 0821 12/24/22 0806  WBC 3.4* 3.2* 3.2*  RBC 3.23* 3.10* 3.29*  HGB 9.2* 8.9* 9.5*  HCT 29.4* 28.6* 30.2*  MCV 91.0 92.3 91.8  MCH 28.5 28.7 28.9  MCHC 31.3 31.1 31.5  RDW 15.7* 15.7* 15.6*  PLT 56* 55* 58*    BNP Recent Labs  Lab 12/20/22 1141 12/20/22 1418  BNP 50.8 56.0     Radiology    No results found.  Cardiac Studies   2d echo 06/2022   1. Left ventricular ejection fraction, by estimation, is 55%. The left  ventricle has normal function. The left ventricle has no regional wall  motion abnormalities. There is mild left ventricular hypertrophy. Left  ventricular diastolic parameters were  normal.   2. Right ventricular systolic function is normal. The right ventricular  size is mildly enlarged. Tricuspid regurgitation signal is inadequate for  assessing PA pressure.   3. Left atrial size was mildly dilated.   4. Right atrial size was mildly dilated.   5. The mitral valve is normal in structure. Trivial mitral valve  regurgitation. No evidence of  mitral stenosis.   6. The aortic valve is grossly normal. Aortic valve regurgitation is not  visualized. No aortic stenosis is present.   7. The inferior vena cava is dilated in size with >50% respiratory  variability, suggesting right atrial pressure of 8 mmHg.   Patient Profile     67 y.o. male with chronic HFpEF, HTN, Type 2 DM, Stage 3 CKD, Stage 4 cirrhosis, thrombocytopenia (due to splenomegaly) and COPD admitted with worsening SOB, orthopnea, and anasarca.  Assessment & Plan    1. Acute on chronic HFpEF - He is at least 10+ pounds above his baseline weight and by review of notes, it sounds like this was in the setting of him reducing his diuretic due to frequent urination and incontinence. BNP at 56 on admission but likely not accurate given his body habitus. CXR did show mild vascular congestion. Dry weight not totally clear - lowest weight recorded since 2023 was 360lb, was 372 in 07/2022 - diuresing well on present regimen, IV Lasix 80mg  TID with stable renal parameters - anticipate continuation given continued brisk diuresis - current rx otherwise: carvedilol 6.25 mg twice daily, Farxiga 10 mg daily and Spironolactone 100 mg daily - Dr. Shirlee Latch felt he will likely require torsemide + metolazone upon DC   2. HTN - stable   3. Stage IV Cirrhosis, Hypoalbuminemia - Followed by GI as outpatient - Also likely contributing to fluid retention   4. Stage 3 CKD - Prior baseline Cr 1.3, but more recently 1.5-1.8 - at 1.92 on admission, maintaining in the 1.7 range   5. Anemia, thrombocytopenia, leukopenia - continue to follow - per IM  6. Morbid obesity/OSA - does not want to use CPAP per prior notes, also likely contributing to the above, would revisit outpatient   For questions or updates, please contact [facility] Please consult [email or web address] for contact info under Cardiology/STEMI.  Signed, Laurann Montana, PA-C 12/24/2022, 9:53 AM     Attending note:  Patient  seen and examined.  I reviewed the chart and discussed case with Ms. Jetta Lout, agree with her above findings.  Mr. Emry continues to diurese with improving edema, although not clear that he is at baseline.  True baseline weight is not known, Vickers  be in the 360s to 370s.  On examination today he states that he feels better.  He is afebrile, heart rate is in the 70s to 90s in sinus rhythm, systolic in the 130s.  He diuresed net output of approximately 4000 cc last 24 hours.  Lungs with decreased breath sounds.  Significant peripheral edema/anasarca with subjective improvement reported.  Weight recorded at 374 pounds.  Pertinent lab work includes potassium 4.1, creatinine 1.78, hemoglobin 9.5, platelets 58.  Would recommend continuing IV diuresis, try and optimize fluid status until bump in creatinine.  He is on Coreg, Farxiga, Aldactone, and potassium supplements otherwise.  Continue to reassess daily.  Jonelle Sidle, M.D., F.A.C.C.

## 2022-12-24 NOTE — Progress Notes (Signed)
OT Cancellation Note  Patient Details Name: Daniel Crawford MRN: [id number] DOB: [birth year]   Cancelled Treatment:    Reason Eval/Treat Not Completed: OT screened, no needs identified, will sign off. Per PT, pt is performing mobility tasks, using restroom, and grooming tasks independently. No further OT needs at this time.     Ezra Sites, OTR/L  [phone] 12/24/2022, 7:31 AM

## 2022-12-24 NOTE — Progress Notes (Signed)
[facility] PROGRESS NOTE  Daniel Crawford (DOB: [birth year]) BJY:[id number] PCP: [name], MD Outpatient Specialists: Heart Failure, Dr. Shirlee Latch  Brief Narrative: 67 year old M with PMH of NASH cirrhosis, diastolic CHF, HTN, hypothyroidism, asthma and CKD-3B sent to ED from CHF clinic for IV diuretics due to weight gain, worsening edema and anasarca over the last few weeks. Patient was advised to go to [facility] but decided to come to [facility] which is close to his home. CXR consistent with CHF. Patient was started on diuretic with cardiology consultation.   Subjective: Fluid is coming off, swelling slightly better, but not at his suspected baseline. Some dyspnea getting around though overall improved.   Objective: BP (!) 114/59 (BP Location: Left Arm)   Pulse 71   Temp 98.4 F (36.9 C) (Oral)   Resp 20   Ht 6\' 1"  (1.854 m)   Wt (!) 169.6 kg   SpO2 100%   BMI 49.33 kg/m   Gen: No distress Pulm: Diminished, no crackles, nonlabored  CV: RRR, UTD JVD, gross pitting LE edema GI: Soft, NT, ND, +BS, obesity limits sensitivity Neuro: Alert and oriented. No new focal deficits. Ext: Warm, no deformities. Skin: Chronic hyperkeratosis/pigmentation LE's. No other rashes, lesions or ulcers on visualized skin   Assessment & Plan: Acute on chronic diastolic CHF/anasarca/BLE edema: Seen to ED from CHF clinic due to anasarca/BLE edema.  TTE in 06/2022 with LVEF of 55%, normal RVSP.  BNP 50.8.  CXR concerning for CHF.  Started on IV Lasix 80 mg twice daily.  About 3.6 L UOP/24 hours.  Cr improved.  BP stable. -Continue IV lasix, ~4L / 24 hours, continue I/O and daily weight monitoring. Suspect EDW in 360lbs range, remains peripherally primarily volume up.  -Continue spironolactone, farxiga, coreg.   - Appreciate cardiology following. -Per admitting MD, Dr. Jearld Pies recommended torsemide 60 mg twice daily with metolazone 2.5 mg once or twice a week on discharge and outpatient follow-up in 3  weeks   NASH cirrhosis/Stage IV by liver biopsy, likely due to NASH History of hepatic encephalopathy/hyperammonemia Splenomegaly with thrombocytopenia -Continue to monitor LFTs closely -Continue with rifaximin and lactulose -Continue with diuresis and Aldactone   Thrombocytopenia/pancytopenia: Due to liver cirrhosis and splenomegaly, chronic, stable at baseline -Continue monitoring   Chronic COPD: Stable.  He is no longer smokes   OSA: Does not want to use CPAP   Neuropathy -Continue with gabapentin   GERD -Continue with PPI    Depression-anxiety: Stable.  -Continue with Zoloft and hydroxyzine   Possible LLE cellulitis: -Continue doxycycline   Morbid obesity Body mass index is 49.33 kg/m.  Tyrone Nine, MD [facility] [email or web address] 12/24/2022, 5:29 PM

## 2022-12-24 NOTE — Plan of Care (Signed)
  Problem: Education: Goal: Understanding of CV disease, CV risk reduction, and recovery process will improve Outcome: Progressing   Problem: Activity: Goal: Ability to return to baseline activity level will improve Outcome: Progressing   Problem: Cardiovascular: Goal: Ability to achieve and maintain adequate cardiovascular perfusion will improve Outcome: Progressing   Problem: Health Behavior/Discharge Planning: Goal: Ability to safely manage health-related needs after discharge will improve Outcome: Progressing   Problem: Education: Goal: Ability to demonstrate management of disease process will improve Outcome: Progressing Goal: Ability to verbalize understanding of medication therapies will improve Outcome: Progressing   Problem: Activity: Goal: Capacity to carry out activities will improve Outcome: Progressing   Problem: Cardiac: Goal: Ability to achieve and maintain adequate cardiopulmonary perfusion will improve Outcome: Progressing   Problem: Education: Goal: Ability to describe self-care measures that Groene prevent or decrease complications (Diabetes Survival Skills Education) will improve Outcome: Progressing   Problem: Coping: Goal: Ability to adjust to condition or change in health will improve Outcome: Progressing   Problem: Fluid Volume: Goal: Ability to maintain a balanced intake and output will improve Outcome: Progressing   Problem: Health Behavior/Discharge Planning: Goal: Ability to identify and utilize available resources and services will improve Outcome: Progressing Goal: Ability to manage health-related needs will improve Outcome: Progressing   Problem: Metabolic: Goal: Ability to maintain appropriate glucose levels will improve Outcome: Progressing   Problem: Nutritional: Goal: Maintenance of adequate nutrition will improve Outcome: Progressing Goal: Progress toward achieving an optimal weight will improve Outcome: Progressing   Problem:  Skin Integrity: Goal: Risk for impaired skin integrity will decrease Outcome: Progressing   Problem: Tissue Perfusion: Goal: Adequacy of tissue perfusion will improve Outcome: Progressing   Problem: Education: Goal: Knowledge of General Education information will improve Description: Including pain rating scale, medication(s)/side effects and non-pharmacologic comfort measures Outcome: Progressing   Problem: Health Behavior/Discharge Planning: Goal: Ability to manage health-related needs will improve Outcome: Progressing   Problem: Clinical Measurements: Goal: Ability to maintain clinical measurements within normal limits will improve Outcome: Progressing Goal: Will remain free from infection Outcome: Progressing Goal: Diagnostic test results will improve Outcome: Progressing Goal: Respiratory complications will improve Outcome: Progressing Goal: Cardiovascular complication will be avoided Outcome: Progressing   Problem: Activity: Goal: Risk for activity intolerance will decrease Outcome: Progressing   Problem: Nutrition: Goal: Adequate nutrition will be maintained Outcome: Progressing   Problem: Coping: Goal: Level of anxiety will decrease Outcome: Progressing   Problem: Elimination: Goal: Will not experience complications related to bowel motility Outcome: Progressing Goal: Will not experience complications related to urinary retention Outcome: Progressing   Problem: Pain Managment: Goal: General experience of comfort will improve Outcome: Progressing   Problem: Safety: Goal: Ability to remain free from injury will improve Outcome: Progressing   Problem: Skin Integrity: Goal: Risk for impaired skin integrity will decrease Outcome: Progressing

## 2022-12-25 DIAGNOSIS — I5033 Acute on chronic diastolic (congestive) heart failure: Secondary | ICD-10-CM | POA: Diagnosis not present

## 2022-12-25 LAB — BASIC METABOLIC PANEL
Anion gap: 10 (ref 5–15)
BUN: 26 mg/dL — ABNORMAL HIGH (ref 8–23)
CO2: 29 mmol/L (ref 22–32)
Calcium: 8.6 mg/dL — ABNORMAL LOW (ref 8.9–10.3)
Chloride: 93 mmol/L — ABNORMAL LOW (ref 98–111)
Creatinine, Ser: 1.72 mg/dL — ABNORMAL HIGH (ref 0.61–1.24)
GFR, Estimated: 44 mL/min — ABNORMAL LOW (ref >60)
Glucose, Bld: 115 mg/dL — ABNORMAL HIGH (ref 70–99)
Potassium: 3.7 mmol/L (ref 3.5–5.1)
Sodium: 132 mmol/L — ABNORMAL LOW (ref 135–145)

## 2022-12-25 LAB — TSH: TSH: 3.738 u[IU]/mL (ref 0.350–4.500)

## 2022-12-25 LAB — CBC
HCT: 28.6 % — ABNORMAL LOW (ref 39.0–52.0)
Hemoglobin: 9.1 g/dL — ABNORMAL LOW (ref 13.0–17.0)
MCH: 29 pg (ref 26.0–34.0)
MCHC: 31.8 g/dL (ref 30.0–36.0)
MCV: 91.1 fL (ref 80.0–100.0)
Platelets: 62 10*3/uL — ABNORMAL LOW (ref 150–400)
RBC: 3.14 MIL/uL — ABNORMAL LOW (ref 4.22–5.81)
RDW: 15.5 % (ref 11.5–15.5)
WBC: 3.4 10*3/uL — ABNORMAL LOW (ref 4.0–10.5)
nRBC: 0 % (ref 0.0–0.2)

## 2022-12-25 LAB — MAGNESIUM: Magnesium: 2.2 mg/dL (ref 1.7–2.4)

## 2022-12-25 LAB — T4, FREE: Free T4: 1.3 ng/dL — ABNORMAL HIGH (ref 0.61–1.12)

## 2022-12-25 LAB — GLUCOSE, CAPILLARY: Glucose-Capillary: 115 mg/dL — ABNORMAL HIGH (ref 70–99)

## 2022-12-25 MED ORDER — TORSEMIDE 40 MG PO TABS: 80.0000 mg | ORAL_TABLET | Freq: Two times a day (BID) | ORAL | 1 refills | Status: DC

## 2022-12-25 MED ORDER — PROMETHAZINE HCL 25 MG/ML IJ SOLN
INTRAMUSCULAR | Status: AC
  Filled 2022-12-25: qty 1

## 2022-12-25 MED ORDER — SODIUM CHLORIDE 0.9 % IV SOLN
12.5000 mg | Freq: Once | INTRAVENOUS | Status: AC
  Administered 2022-12-25: 12.5 mg via INTRAVENOUS
  Filled 2022-12-25: qty 0.5

## 2022-12-25 MED ORDER — METOLAZONE 2.5 MG PO TABS: 2.5000 mg | ORAL_TABLET | ORAL | 0 refills | Status: DC

## 2022-12-25 NOTE — Care Management Important Message (Signed)
Important Message  Patient Details  Name: Adolfo Biasi MRN: [id number] Date of Birth: 1955-05-01   Important Message Given:  Yes - Medicare IM     [name] 12/25/2022, 10:28 AM

## 2022-12-25 NOTE — Progress Notes (Signed)
Progress Note  Patient Name: Daniel Crawford Date of Encounter: 12/25/2022  Primary Cardiologist: Nona Dell, MD  Subjective   Continues to diurese, weight down to 370 pounds.  Net urine output of approximately 2200 cc last 24 hours.  He wants to go home today.  Inpatient Medications    Scheduled Meds:  carvedilol  6.25 mg Oral BID WC   dapagliflozin propanediol  10 mg Oral QAC breakfast   doxycycline  100 mg Oral Q12H   furosemide  80 mg Intravenous TID   gabapentin  200 mg Oral QHS   insulin aspart  0-9 Units Subcutaneous TID WC   lactulose  20 g Oral BID   levothyroxine  175 mcg Oral QAC breakfast   pantoprazole  40 mg Oral Daily   polyethylene glycol  17 g Oral Daily   potassium chloride SA  40 mEq Oral BID   rifaximin  550 mg Oral BID   sertraline  100 mg Oral Daily   spironolactone  100 mg Oral Daily   traZODone  50 mg Oral QHS    PRN Meds: acetaminophen, hydrOXYzine   Vital Signs    Vitals:   12/24/22 1337 12/24/22 2149 12/25/22 0300 12/25/22 0358  BP: (!) 114/59 (!) 135/57  114/63  Pulse: 71 72  74  Resp: 20 20  20   Temp: 98.4 F (36.9 C) 98.7 F (37.1 C)  98.2 F (36.8 C)  TempSrc: Oral     SpO2: 100% 100%  98%  Weight:   (!) 168.1 kg   Height:        Intake/Output Summary (Last 24 hours) at 12/25/2022 0904 Last data filed at 12/25/2022 0300 Gross per 24 hour  Intake 720 ml  Output 2925 ml  Net -2205 ml      12/25/2022    3:00 AM 12/24/2022    5:00 AM 12/23/2022    6:17 AM  Last 3 Weights  Weight (lbs) 370 lb 9.5 oz 373 lb 14.4 oz 373 lb 14.4 oz  Weight (kg) 168.1 kg 169.6 kg 169.6 kg     Telemetry    Telemetry reviewed showing sinus rhythm with occasional PSVT.  Physical Exam   GEN: No acute distress.  Cardiac: RRR no murmurs, rubs, or gallops.  Respiratory: Clear to auscultation bilaterally. Breathing is unlabored. GI: Soft, nontender, non-distended. MS: no deformity. Extremities: Chronic appearing edema as before, gradually  improving.  Labs   Chemistry Recent Labs  Lab 12/20/22 1418 12/21/22 0426 12/22/22 0443 12/23/22 0720 12/24/22 0522 12/25/22 0433  NA 134* 135 136 133* 134* 132*  K 4.2 3.7 3.9 3.7 4.1 3.7  CL 95* 96* 96* 95* 94* 93*  CO2 31 29 29 27 27 29   GLUCOSE 209* 125* 118* 143* 113* 115*  BUN 34* 32* 30* 29* 29* 26*  CREATININE 1.92* 1.79* 1.76* 1.73* 1.78* 1.72*  CALCIUM 9.1 8.8* 9.0 8.7* 8.7* 8.6*  PROT 6.8 6.5  --   --   --   --   ALBUMIN 3.1* 3.1* 3.0* 3.0* 3.1*  --   AST 52* 50*  --   --   --   --   ALT 26 26  --   --   --   --   ALKPHOS 81 77  --   --   --   --   BILITOT 3.5* 3.5*  --   --   --   --   GFRNONAA 38* 42* 43* 44* 42* 44*  ANIONGAP 8 10 11  11  13 10     Hematology Recent Labs  Lab 12/23/22 0821 12/24/22 0806 12/25/22 0433  WBC 3.2* 3.2* 3.4*  RBC 3.10* 3.29* 3.14*  HGB 8.9* 9.5* 9.1*  HCT 28.6* 30.2* 28.6*  MCV 92.3 91.8 91.1  MCH 28.7 28.9 29.0  MCHC 31.1 31.5 31.8  RDW 15.7* 15.6* 15.5  PLT 55* 58* 62*    BNP Recent Labs  Lab 12/20/22 1141 12/20/22 1418  BNP 50.8 56.0     Radiology    No results found.  Cardiac Studies   Echocardiogram 07/07/2022:   1. Left ventricular ejection fraction, by estimation, is 55%. The left  ventricle has normal function. The left ventricle has no regional wall  motion abnormalities. There is mild left ventricular hypertrophy. Left  ventricular diastolic parameters were  normal.   2. Right ventricular systolic function is normal. The right ventricular  size is mildly enlarged. Tricuspid regurgitation signal is inadequate for  assessing PA pressure.   3. Left atrial size was mildly dilated.   4. Right atrial size was mildly dilated.   5. The mitral valve is normal in structure. Trivial mitral valve  regurgitation. No evidence of mitral stenosis.   6. The aortic valve is grossly normal. Aortic valve regurgitation is not  visualized. No aortic stenosis is present.   7. The inferior vena cava is dilated in  size with >50% respiratory  variability, suggesting right atrial pressure of 8 mmHg.   Assessment & Plan    1. Acute on chronic HFpEF Diuresing well on IV Lasix with weight down to 370 pounds, true baseline is not certain but clinically he feels better and wants to go home today.  Renal function has been stable.  He had a net output of approximately 2200 cc last 24 hours.  He states that at baseline he had already been taking Demadex 60 mg twice daily along with Aldactone 100 mg daily.   2.  Essential hypertension Blood pressure has been adequately controlled.   3. Stage IV Cirrhosis, Hypoalbuminemia Followed by gastroenterology and also associated with fluid retention.   4. Stage 3 CKD Creatinine has been relatively stable in the setting of IV diuresis, currently 1.72.  Potassium 3.7 on supplements.  5. Morbid obesity/OSA Noncompliant with CPAP.  Patient wants to go home today.  He has follow-up scheduled in the heart failure clinic on the 17th.  Would plan to discharge on Demadex 80 mg twice daily, continue potassium supplement, start metolazone 2.5 mg once a week and uptitrate as needed, continue Aldactone 100 mg daily.  He is also on Coreg and [place].  For questions or updates, please contact [facility] Please consult [email or web address] for contact info under Cardiology/STEMI.  Signed, Nona Dell, MD 12/25/2022, 9:04 AM

## 2022-12-25 NOTE — Discharge Summary (Signed)
Physician Discharge Summary   Patient: Daniel Crawford MRN: [id number] DOB: 05-06-1955  Admit date:     12/20/2022  Discharge date: 12/25/22  Discharge Physician: Tyrone Nine   PCP: [name], MD   Recommendations at discharge:  Follow up with PCP in next week, suggest recheck BMP at that visit.  Follow up as scheduled with cardiology 10/17.   Discharge Diagnoses: Principal Problem:   Acute on chronic diastolic CHF (congestive heart failure) (HCC) Active Problems:   Cirrhosis of liver with ascites (HCC)   Essential hypertension   CKD stage 3a, GFR 45-59 ml/min (HCC)   Acquired hypothyroidism   Thrombocytopenia (HCC)   Depression with anxiety   Anasarca   GERD (gastroesophageal reflux disease)   Acute exacerbation of congestive heart failure (HCC)   NASH (nonalcoholic steatohepatitis)   Class 3 severe obesity in adult [facility])   Cellulitis and abscess of left leg  Hospital Course: 67 year old M with PMH of NASH cirrhosis, diastolic CHF, HTN, hypothyroidism, asthma and CKD-3B sent to ED from CHF clinic for IV diuretics due to weight gain, worsening edema and anasarca over the last few weeks. Patient was advised to go to [facility] but decided to come to [facility] which is close to his home. CXR consistent with CHF. Patient was started on diuretic with cardiology consultation. With IV diuresis, volume status has improved. Though he is not optimized, he is requesting discharge, weight 370lbs without respiratory complaints. Declines recommendation to remain inpatient for ongoing IV diuresis. Discharged on augmented torsemide 80mg  BID and additional metolazone 2.5mg  weekly with cardiology follow up secured.   Assessment and Plan: Acute on chronic diastolic CHF/anasarca/BLE edema: Seen to ED from CHF clinic due to anasarca/BLE edema.  TTE in 06/2022 with LVEF of 55%, normal RVSP.  BNP 50.8.  CXR concerning for CHF.  Started on IV Lasix 80 mg twice daily.  About 3.6 L UOP/24 hours.  Cr  improved.  BP stable. - Weight on day of patient leaving is 370lbs. Suspect EDW in 360lbs range, remains peripherally primarily volume up.  -Continue spironolactone, farxiga, coreg.   - Appreciate cardiology following, has follow up. Pt says he takes torsemide 60mg  BID, will augment to 80mg  BID, add metolazone 2.5 mg once a week, continue K supplementation.   NASH cirrhosis/Stage IV by liver biopsy, likely due to NASH History of hepatic encephalopathy/hyperammonemia Splenomegaly with thrombocytopenia -Continue to monitor LFTs closely -Continue with rifaximin and lactulose -Continue with diuresis and Aldactone   Thrombocytopenia/pancytopenia: Due to liver cirrhosis and splenomegaly, chronic, stable at baseline -Continue monitoring   Chronic COPD: Stable.  He is no longer smokes   OSA: Does not want to use CPAP   Neuropathy -Continue with gabapentin   GERD -Continue with PPI    Depression-anxiety: Stable.  -Continue with Zoloft and hydroxyzine   Possible LLE cellulitis: -Continue doxycycline   Morbid obesity Body mass index is 49.33 kg/m.  Consultants: Cardiology Procedures performed: None  Disposition: Home Diet recommendation:  Cardiac diet DISCHARGE MEDICATION: Allergies as of 12/25/2022   No Known Allergies      Medication List     TAKE these medications    acetaminophen 500 MG tablet Commonly known as: TYLENOL Take 1,000 mg by mouth every 6 (six) hours as needed for moderate pain.   albuterol 108 (90 Base) MCG/ACT inhaler Commonly known as: VENTOLIN HFA Inhale 2 puffs into the lungs every 6 (six) hours as needed for wheezing or shortness of breath.   carvedilol 6.25 MG tablet  Commonly known as: COREG Take 6.25 mg by mouth 2 (two) times daily with a meal.   dapagliflozin propanediol 10 MG Tabs tablet Commonly known as: Farxiga Take 1 tablet (10 mg total) by mouth daily before breakfast.   gabapentin 100 MG capsule Commonly known as: NEURONTIN Take  100 mg by mouth daily as needed (pain).   hydrOXYzine 50 MG capsule Commonly known as: VISTARIL Take 1 capsule (50 mg total) by mouth 3 (three) times daily as needed for anxiety or nausea.   lactose free nutrition Liqd Take 237 mLs by mouth every evening.   lactulose 10 GM/15ML solution Commonly known as: CHRONULAC Take 30 mLs (20 g total) by mouth 2 (two) times daily.   levothyroxine 175 MCG tablet Commonly known as: SYNTHROID Take 175 mcg by mouth daily before breakfast.   metolazone 2.5 MG tablet Commonly known as: ZAROXOLYN Take 1 tablet (2.5 mg total) by mouth once a week.   ondansetron 4 MG tablet Commonly known as: ZOFRAN Take 4 mg by mouth 2 (two) times daily as needed.   pantoprazole 40 MG tablet Commonly known as: PROTONIX Take 1 tablet (40 mg total) by mouth daily.   polyethylene glycol 17 g packet Commonly known as: MIRALAX / GLYCOLAX Take 17 g by mouth daily.   potassium chloride SA 20 MEQ tablet Commonly known as: KLOR-CON M Take 2 tablets (40 mEq total) by mouth 2 (two) times daily.   rifaximin 550 MG Tabs tablet Commonly known as: XIFAXAN Take 1 tablet (550 mg total) by mouth 2 (two) times daily.   sertraline 100 MG tablet Commonly known as: ZOLOFT Take 100 mg by mouth daily.   spironolactone 100 MG tablet Commonly known as: ALDACTONE Take 100 mg by mouth daily.   Torsemide 40 MG Tabs Take 80 mg by mouth 2 (two) times daily. What changed:  medication strength how much to take   traZODone 50 MG tablet Commonly known as: DESYREL Take 1 tablet (50 mg total) by mouth at bedtime.        Follow-up Information     [name], MD Follow up.   Specialties: Family Medicine, Sports Medicine Contact information: [address] [place] [address] [phone]         Laurey Morale, MD Follow up on 01/10/2023.   Specialty: Cardiology Contact information: [address] [place] [address] [phone]                 Discharge Exam: Ceasar Mons Weights   12/23/22 0617 12/24/22 0500 12/25/22 0300  Weight: (!) 169.6 kg (!) 169.6 kg (!) 168.1 kg  BP 114/63 (BP Location: Left Arm)   Pulse 74   Temp 98.2 F (36.8 C)   Resp 20   Ht 6\' 1"  (1.854 m)   Wt (!) 168.1 kg   SpO2 98%   BMI 48.89 kg/m   No distress No crackles or wheezes, nonlabored.  RRR, no JVD, ++ LE edema improved from prior Alert, oriented  Condition at discharge: stable  The results of significant diagnostics from this hospitalization (including imaging, microbiology, ancillary and laboratory) are listed below for reference.   Imaging Studies: DG Chest 2 View  Result Date: 12/20/2022 CLINICAL DATA:  Worsening bilateral lower extremity swelling. EXAM: CHEST - 2 VIEW COMPARISON:  10/19/2022 FINDINGS: Cardiopericardial silhouette is at upper limits of normal for size. The cardiopericardial silhouette is within normal limits for size. Mild vascular congestion without edema. No pleural effusion. No acute bony abnormality. IMPRESSION:  Mild vascular congestion without edema. Electronically Signed   By: Kennith Center M.D.   On: 12/20/2022 16:27    Microbiology: Results for orders placed or performed during the hospital encounter of 10/19/22  Culture, blood (Routine X 2) w Reflex to ID Panel     Status: None   Collection Time: 10/20/22  9:13 AM   Specimen: Right Antecubital; Blood  Result Value Ref Range Status   Specimen Description   Final    RIGHT ANTECUBITAL BOTTLES DRAWN AEROBIC AND ANAEROBIC   Special Requests Blood Culture adequate volume  Final   Culture   Final    NO GROWTH 5 DAYS Performed at [facility], [address], [place], [place] [address]    Report Status 10/25/2022 FINAL  Final  Culture, blood (Routine X 2) w Reflex to ID Panel     Status: None   Collection Time: 10/20/22  9:13 AM   Specimen: BLOOD RIGHT HAND  Result Value Ref Range Status   Specimen Description   Final    BLOOD RIGHT HAND BOTTLES DRAWN AEROBIC  AND ANAEROBIC   Special Requests Blood Culture adequate volume  Final   Culture   Final    NO GROWTH 5 DAYS Performed at [facility], [address], [place], [place] [address]    Report Status 10/25/2022 FINAL  Final  SARS Coronavirus 2 by RT PCR (hospital order, performed in [facility] hospital lab) *cepheid single result test* Anterior Nasal Swab     Status: None   Collection Time: 10/20/22  6:00 PM   Specimen: Anterior Nasal Swab  Result Value Ref Range Status   SARS Coronavirus 2 by RT PCR NEGATIVE NEGATIVE Final    Comment: (NOTE) SARS-CoV-2 target nucleic acids are NOT DETECTED.  The SARS-CoV-2 RNA is generally detectable in upper and lower respiratory specimens during the acute phase of infection. The lowest concentration of SARS-CoV-2 viral copies this assay can detect is 250 copies / mL. A negative result does not preclude SARS-CoV-2 infection and should not be used as the sole basis for treatment or other patient management decisions.  A negative result [place] occur with improper specimen collection / handling, submission of specimen other than nasopharyngeal swab, presence of viral mutation(s) within the areas targeted by this assay, and inadequate number of viral copies (<250 copies / mL). A negative result must be combined with clinical observations, patient history, and epidemiological information.  Fact Sheet for Patients:   RoadLapTop.co.za  Fact Sheet for Healthcare Providers: http://kim-miller.com/  This test is not yet approved or  cleared by the [place] FDA and has been authorized for detection and/or diagnosis of SARS-CoV-2 by FDA under an Emergency Use Authorization (EUA).  This EUA will remain in effect (meaning this test can be used) for the duration of the COVID-19 declaration under Section 564(b)(1) of the Act, 21 U.S.C. section 360bbb-3(b)(1), unless the authorization is terminated or revoked  sooner.  Performed at [facility], [address] [place], [place] [address]     Labs: CBC: Recent Labs  Lab 12/20/22 1418 12/21/22 0426 12/22/22 0443 12/23/22 0821 12/24/22 0806 12/25/22 0433  WBC 3.5* 3.7* 3.4* 3.2* 3.2* 3.4*  NEUTROABS 2.2  --   --   --   --   --   HGB 9.7* 9.0* 9.2* 8.9* 9.5* 9.1*  HCT 30.6* 28.4* 29.4* 28.6* 30.2* 28.6*  MCV 91.6 90.4 91.0 92.3 91.8 91.1  PLT 66* 58* 56* 55* 58* 62*   Basic Metabolic Panel: Recent  Labs  Lab 12/21/22 0426 12/22/22 0443 12/23/22 0720 12/24/22 0522 12/25/22 0433  NA 135 136 133* 134* 132*  K 3.7 3.9 3.7 4.1 3.7  CL 96* 96* 95* 94* 93*  CO2 29 29 27 27 29   GLUCOSE 125* 118* 143* 113* 115*  BUN 32* 30* 29* 29* 26*  CREATININE 1.79* 1.76* 1.73* 1.78* 1.72*  CALCIUM 8.8* 9.0 8.7* 8.7* 8.6*  MG 2.5* 2.5* 2.4 2.4 2.2  PHOS  --  4.0 3.8 4.0  --    Liver Function Tests: Recent Labs  Lab 12/20/22 1418 12/21/22 0426 12/22/22 0443 12/23/22 0720 12/24/22 0522  AST 52* 50*  --   --   --   ALT 26 26  --   --   --   ALKPHOS 81 77  --   --   --   BILITOT 3.5* 3.5*  --   --   --   PROT 6.8 6.5  --   --   --   ALBUMIN 3.1* 3.1* 3.0* 3.0* 3.1*   CBG: Recent Labs  Lab 12/24/22 0739 12/24/22 1121 12/24/22 1614 12/24/22 2148 12/25/22 0711  GLUCAP 111* 109* 131* 134* 115*    Discharge time spent: greater than 30 minutes.  Signed: Tyrone Nine, MD [facility] 12/25/2022

## 2022-12-27 ENCOUNTER — Telehealth (HOSPITAL_COMMUNITY): Payer: Self-pay | Admitting: Licensed Clinical Social Worker

## 2022-12-27 NOTE — Telephone Encounter (Signed)
CSW [place] Paramedic updated documentation follow pt hospital DC  Will continue to follow and assist as needed  Burna Sis, LCSW Clinical Social Worker [facility] Desk#: [phone] Cell#: [phone]

## 2023-01-09 NOTE — Progress Notes (Signed)
PCP: [name], MD Cardiology: Dr. Diona Browner HF Cardiology: Dr. Shirlee Latch  67 y.o. with history of NASH cirrhosis, chronic diastolic CHF, DM2, CKD stage 3, and COPD was referred by Dr. Diona Browner for evaluation of CHF.  Patient has cirrhosis due to NASH with splenomegaly, thrombocytopenia, and h/o hepatic encephalopathy.  Liver biopsy in 1/23 showed stage IV cirrhosis.  Patient has struggled with volume overload.  He was followed in the heart failure program at [facility] before moving to [place].  RHC in 10/23 showed mildly elevated filling pressures with pulmonary venous hypertension.  Echo in 4/24 showed EF 55%, mild LVH, normal RV systolic function with mild RV enlargement.  He has had several recent admissions with CHF/anasarca and hepatic encephalopathy.  He has never had enough ascites for paracentesis.  He was admitted twice in 7/24 for CHF and diuresed.  Cardiology was not consulted.  For reasons that remain obscure, he was taken off torsemide and put back on Lasix.   Seen for follow-up on 11/28/22. Weight up about 20 lb after Torsemide switched to lasix. He was restarted on torsemide at 60 mg BID. Also started [place]. Referred to Paramedicine. Called on 09/18 to report very brisk urine output resulting in incontinence. Torsemide cut back to 40 mg BID.  Seen back in F/u 12/20/22 and was still significantly volume overloaded. Provider recommended Furoscix 80 mg [place] daily x 3 days then increase torsemide back to 60 mg bid after that in order to try to keep him out of the hospital. However, he wanted admission for diuresis but refused admission to [facility] and was adamant that he wanted to be admitted to [facility], closer to home. He drove there for admission and was started on IV Lasix but notes outline that pt left prior to being fully diuresed, despite MD recommendation to stay for further diuresis. He was discharged still volume overloaded. D/c wt was 370 lb. He was discharged home on torsemide 80 mg bid + once  weekly metolazone.   He returns back today for evaluation. Wt stable since being discharged, at 372 lb. He c/w LEE (2+) but he reports overal subjective improvement in appearance. Denies resting dyspnea. C/w NYHA Class III symptoms. Reports full med compliance. Not wearing compression stockings.    ECG (personally reviewed): not performed   Labs (4/24): NH3 74, K 3.7, creatinine 1.31, BNP 47 Labs (5/24): K 4.1, creatinine 1.67 Labs (8/24): K 3.9, creatinine 1.13, hgb 8.5, plts 61 Labs (9/24): K 3.7, creatinine 1.72, TSH 3.73, Free T4 1.30(H)  PMH: 1. Cirrhosis: Due to NASH.  Splenomegaly, chronic thrombocytopenia, history of hepatic encephalopathy.  - Liver biopsy in 1/23 with stage IV cirrhosis.  2. Thrombocytopenia: Due to splenomegaly.  3. Hypothyroidism 4. HTN 5. Type 2 diabetes 6. CKD stage 3 7. COPD: Moderate obstruction on PFTs, prior smoker.  8. HFpEF: RHC (10/23) with mean RA 15, PA 36/20 mean 28, mean PCWP 19, PVR 1.2 WU, CI 2.65.  - Echo (4/24): EF 55%, mild LVH, normal RV systolic function with mild RV enlargement.    SH: Lives in [place], single, quit smoking 10 years ago, no ETOH.   Family History  Problem Relation Age of Onset   Liver cancer Mother    Heart disease Mother    Aneurysm Sister    ROS: All systems reviewed and negative except as per HPI.   Current Outpatient Medications  Medication Sig Dispense Refill   acetaminophen (TYLENOL) 500 MG tablet Take 1,000 mg by mouth every 6 (six) hours as  needed for moderate pain.     albuterol (VENTOLIN HFA) 108 (90 Base) MCG/ACT inhaler Inhale 2 puffs into the lungs every 6 (six) hours as needed for wheezing or shortness of breath. 18 g 2   carvedilol (COREG) 6.25 MG tablet Take 6.25 mg by mouth 2 (two) times daily with a meal.     dapagliflozin propanediol (FARXIGA) 10 MG TABS tablet Take 1 tablet (10 mg total) by mouth daily before breakfast. 90 tablet 3   gabapentin (NEURONTIN) 100 MG capsule Take 100 mg by mouth  daily as needed (pain).     hydrOXYzine (VISTARIL) 50 MG capsule Take 1 capsule (50 mg total) by mouth 3 (three) times daily as needed for anxiety or nausea. 90 capsule 2   lactose free nutrition (BOOST) LIQD Take 237 mLs by mouth every evening.     lactulose (CHRONULAC) 10 GM/15ML solution Take 30 mLs (20 g total) by mouth 2 (two) times daily. 1800 mL 2   levothyroxine (SYNTHROID) 175 MCG tablet Take 175 mcg by mouth daily before breakfast.     ondansetron (ZOFRAN) 4 MG tablet Take 4 mg by mouth 2 (two) times daily as needed.     pantoprazole (PROTONIX) 40 MG tablet Take 1 tablet (40 mg total) by mouth daily. 90 tablet 3   polyethylene glycol (MIRALAX / GLYCOLAX) 17 g packet Take 17 g by mouth daily.     metolazone (ZAROXOLYN) 2.5 MG tablet Take 1 tablet (2.5 mg total) by mouth 2 (two) times a week. 24 tablet 3   potassium chloride SA (KLOR-CON M) 20 MEQ tablet Take 2 tablets (40 mEq total) by mouth 2 (two) times daily. 180 tablet 3   rifaximin (XIFAXAN) 550 MG TABS tablet Take 1 tablet (550 mg total) by mouth 2 (two) times daily. 180 tablet 3   sertraline (ZOLOFT) 100 MG tablet Take 100 mg by mouth daily.     spironolactone (ALDACTONE) 100 MG tablet Take 100 mg by mouth daily.     torsemide 40 MG TABS Take 80 mg by mouth 2 (two) times daily. (Patient taking differently: Take 160 mg by mouth 2 (two) times daily. Take 4 tablets (160mg ) by mouth in the morning and 4 tablets (160mg ) by mouthin the evening.) 120 tablet 1   traZODone (DESYREL) 50 MG tablet Take 1 tablet (50 mg total) by mouth at bedtime. 90 tablet 3   No current facility-administered medications for this encounter.   Wt Readings from Last 3 Encounters:  01/10/23 (!) 169.1 kg (372 lb 12.8 oz)  12/25/22 (!) 168.1 kg (370 lb 9.5 oz)  12/20/22 (!) 173.1 kg (381 lb 9.6 oz)   BP (!) 128/56   Pulse 60   Wt (!) 169.1 kg (372 lb 12.8 oz)   SpO2 100%   BMI 49.19 kg/m  PHYSICAL EXAM: General:  Well appearing but obese. No respiratory  difficulty HEENT: normal Neck: supple. JVD 10 cm. Carotids 2+ bilat; no bruits. No lymphadenopathy or thyromegaly appreciated. Cor: PMI nondisplaced. Regular rate & rhythm. No rubs, gallops or murmurs. Lungs: clear Abdomen: obese, soft, nontender, nondistended. No hepatosplenomegaly. No bruits or masses. Good bowel sounds. Extremities: no cyanosis, clubbing, rash, 2+ b/l LE pretibial edema Neuro: alert & oriented x 3, cranial nerves grossly intact. moves all 4 extremities w/o difficulty. Affect pleasant.   ECG: not performed   Assessment/Plan: 1.  Chronic diastolic CHF: RHC in 10/23 with mildly elevated filling pressures and pulmonary venous hypertension.  Echo in 4/24 showed EF 55%, mild LVH,  normal RV systolic function with mild RV enlargement.  Patient has struggled with volume retention in setting of diastolic CHF and cirrhosis.  - NYHA III, confounded by obesity and deconditioning.  - Volume overloaded on exam after recent hospitalization for CHF, given pt refusal to stay until fully diuresed  - Increase Torsemide to 100 mg bid x 3 days, then back to 80 mg bid thereafter - increase metolazone to 2.5 mg 2 days a week, Mon + Thurs - He would be a reasonable Cardiomems candidate, however pt says he was told insurance would not improve. Will confirm this w/ office staff today   - Encouraged to start wearing compression stockings and to elevate legs when able  - Now followed by [facility] paramedicine. - Check BNP and BNP today and again in 7 days  2. NASH cirrhosis: Stage IV by liver biopsy in 1/23.  Suspect due to NASH. He has h/o hepatic encephalopathy as well as splenomegaly with thrombocytopenia.  - Follows with GI in [place].  - Recent LFTs stable. 3. Thrombocytopenia: Due to cirrhosis/splenomegaly. Stable/chronic, 60K range. Other than bruising, denies any other spontaneous bleeding   4. COPD: No longer smokes.  Moderate obstruction on PFTs.  5. OSA: Mild on sleep study in  08/24 - Does not want to use CPAP 6. Abnormal TFTs: recent TSH normal at 3.73, but Free T4 was slightly elevated at 1.30 - repeat TSH and Free T4 and T3  7. Obesity: he expressed desire to go on a GLP1i  - refer to pharmD clinic   F/u w/ APP in 3-4 wks   Robbie Lis, PA-C 01/10/2023

## 2023-01-10 ENCOUNTER — Encounter (HOSPITAL_COMMUNITY): Payer: Self-pay

## 2023-01-10 ENCOUNTER — Ambulatory Visit (HOSPITAL_COMMUNITY)
Admission: RE | Admit: 2023-01-10 | Discharge: 2023-01-10 | Disposition: A | Discharge status: Home / Self Care | Payer: Medicare HMO | Source: Ambulatory Visit | Attending: Cardiology | Admitting: Cardiology

## 2023-01-10 ENCOUNTER — Telehealth (HOSPITAL_COMMUNITY): Payer: Self-pay

## 2023-01-10 VITALS — BP 128/56 | HR 60 | Wt 372.8 lb

## 2023-01-10 DIAGNOSIS — Z6841 Body Mass Index (BMI) 40.0 and over, adult: Secondary | ICD-10-CM | POA: Insufficient documentation

## 2023-01-10 DIAGNOSIS — N183 Chronic kidney disease, stage 3 unspecified: Secondary | ICD-10-CM | POA: Insufficient documentation

## 2023-01-10 DIAGNOSIS — I130 Hypertensive heart and chronic kidney disease with heart failure and stage 1 through stage 4 chronic kidney disease, or unspecified chronic kidney disease: Secondary | ICD-10-CM | POA: Insufficient documentation

## 2023-01-10 DIAGNOSIS — R946 Abnormal results of thyroid function studies: Secondary | ICD-10-CM | POA: Diagnosis not present

## 2023-01-10 DIAGNOSIS — E669 Obesity, unspecified: Secondary | ICD-10-CM | POA: Insufficient documentation

## 2023-01-10 DIAGNOSIS — G4733 Obstructive sleep apnea (adult) (pediatric): Secondary | ICD-10-CM | POA: Diagnosis not present

## 2023-01-10 DIAGNOSIS — Z87891 Personal history of nicotine dependence: Secondary | ICD-10-CM | POA: Diagnosis not present

## 2023-01-10 DIAGNOSIS — K7469 Other cirrhosis of liver: Secondary | ICD-10-CM | POA: Diagnosis not present

## 2023-01-10 DIAGNOSIS — D696 Thrombocytopenia, unspecified: Secondary | ICD-10-CM | POA: Insufficient documentation

## 2023-01-10 DIAGNOSIS — K7581 Nonalcoholic steatohepatitis (NASH): Secondary | ICD-10-CM | POA: Diagnosis not present

## 2023-01-10 DIAGNOSIS — E1122 Type 2 diabetes mellitus with diabetic chronic kidney disease: Secondary | ICD-10-CM | POA: Insufficient documentation

## 2023-01-10 DIAGNOSIS — I5032 Chronic diastolic (congestive) heart failure: Secondary | ICD-10-CM | POA: Insufficient documentation

## 2023-01-10 DIAGNOSIS — J449 Chronic obstructive pulmonary disease, unspecified: Secondary | ICD-10-CM | POA: Insufficient documentation

## 2023-01-10 LAB — BASIC METABOLIC PANEL
Anion gap: 6 (ref 5–15)
BUN: 39 mg/dL — ABNORMAL HIGH (ref 8–23)
CO2: 30 mmol/L (ref 22–32)
Calcium: 9 mg/dL (ref 8.9–10.3)
Chloride: 96 mmol/L — ABNORMAL LOW (ref 98–111)
Creatinine, Ser: 2.44 mg/dL — ABNORMAL HIGH (ref 0.61–1.24)
GFR, Estimated: 29 mL/min — ABNORMAL LOW (ref >60)
Glucose, Bld: 102 mg/dL — ABNORMAL HIGH (ref 70–99)
Potassium: 4.8 mmol/L (ref 3.5–5.1)
Sodium: 132 mmol/L — ABNORMAL LOW (ref 135–145)

## 2023-01-10 LAB — T4, FREE: Free T4: 1.53 ng/dL — ABNORMAL HIGH (ref 0.61–1.12)

## 2023-01-10 LAB — BRAIN NATRIURETIC PEPTIDE: B Natriuretic Peptide: 47 pg/mL (ref 0.0–100.0)

## 2023-01-10 LAB — TSH: TSH: 1.183 u[IU]/mL (ref 0.350–4.500)

## 2023-01-10 MED ORDER — LEVOTHYROXINE SODIUM 88 MCG PO TABS: 88.0000 ug | ORAL_TABLET | Freq: Every day | ORAL | 1 refills | Status: DC

## 2023-01-10 MED ORDER — METOLAZONE 2.5 MG PO TABS: 2.5000 mg | ORAL_TABLET | ORAL | 3 refills | Status: DC

## 2023-01-10 NOTE — Addendum Note (Signed)
Encounter addended by: Chinita Pester, CMA on: 01/10/2023 3:32 PM  Actions taken: Order list changed, Diagnosis association updated

## 2023-01-10 NOTE — Patient Instructions (Addendum)
Labs done today. We will contact you only if your labs are abnormal.  INCREASE Metolazone to 2 times weekly.  INCREASE Torsemide to 100mg  (5 tablets) by mouth 2 times daily for 3 days THEN DECREASE back down to 80mg  (4 tablets) by mouth 2 times daily.   No other medication changes were made. Please continue all current medications as prescribed.  Please wear your compression hose daily, place them on as soon as you get up in the morning and remove before you go to bed at night.  You have been referred to the pharmacy clinic. They will contact you to schedule an appointment.   Your physician recommends that you schedule a follow-up appointment in: 1 week for a lab only appointment (can be done at [facility]) 3 weeks with our NP/PA Clinic here in our office.   If you have any questions or concerns before your next appointment please send [place] a message through [place] or call our office at [phone].    TO LEAVE A MESSAGE FOR THE NURSE SELECT OPTION 2, PLEASE LEAVE A MESSAGE INCLUDING: YOUR NAME DATE OF BIRTH CALL BACK NUMBER REASON FOR CALL**this is important as we prioritize the call backs  YOU WILL RECEIVE A CALL BACK THE SAME DAY AS LONG AS YOU CALL BEFORE 4:00 PM   Do the following things EVERYDAY: Weigh yourself in the morning before breakfast. Write it down and keep it in a log. Take your medicines as prescribed Eat low salt foods--Limit salt (sodium) to 2000 mg per day.  Stay as active as you can everyday Limit all fluids for the day to less than 2 liters   At the [facility], you and your health needs are our priority. As part of our continuing mission to provide you with exceptional heart care, we have created designated Provider Care Teams. These Care Teams include your primary Cardiologist (physician) and Advanced Practice Providers (APPs- Physician Assistants and Nurse Practitioners) who all work together to provide you with the care you need, when you  need it.   You [place] see any of the following providers on your designated Care Team at your next follow up: Dr Arvilla Meres Dr Marca Ancona Dr. Marcos Eke, NP Robbie Lis, [place] [facility] [place], [place] Brynda Peon, NP Karle Plumber, PharmD   Please be sure to bring in all your medications bottles to every appointment.    Thank you for choosing [facility]

## 2023-01-10 NOTE — Telephone Encounter (Signed)
Spoke with patient regarding the following results. Patient made aware and patient verbalized understanding.   New levothyroxine order placed and sent to pharmacy.   Patient is to have repeat labs next week at [facility] in [place].   Patient is scheduled to see his PCP next week-  Patient aware of all instructions and verbalized understanding.

## 2023-01-10 NOTE — Telephone Encounter (Signed)
-----   Message from [place] sent at 01/10/2023  2:20 PM EDT ----- Thyroid hormone is elevated. Reduce Levothyroxine to 88 mcg and needs to f/u w/ his PCP for further management.  SCr is elevated, likely due to volume overload. Needs repeat BMP in 1 wk after diuretic increase

## 2023-01-11 ENCOUNTER — Telehealth (HOSPITAL_COMMUNITY): Payer: Self-pay | Admitting: Licensed Clinical Social Worker

## 2023-01-11 LAB — T3, FREE: T3, Free: 2.2 pg/mL (ref 2.0–4.4)

## 2023-01-11 NOTE — Telephone Encounter (Signed)
CSW sent update regarding yesterdays appt to [facility]  Burna Sis, LCSW Clinical Social Worker [facility] Desk#: [phone] Cell#: [phone]

## 2023-01-17 ENCOUNTER — Other Ambulatory Visit (HOSPITAL_COMMUNITY)
Admission: RE | Admit: 2023-01-17 | Discharge: 2023-01-17 | Disposition: A | Discharge status: Home / Self Care | Payer: Medicare HMO | Source: Ambulatory Visit | Attending: Cardiology | Admitting: Cardiology

## 2023-01-17 DIAGNOSIS — E039 Hypothyroidism, unspecified: Secondary | ICD-10-CM | POA: Diagnosis not present

## 2023-01-17 DIAGNOSIS — I5032 Chronic diastolic (congestive) heart failure: Secondary | ICD-10-CM | POA: Diagnosis not present

## 2023-01-17 DIAGNOSIS — K7290 Hepatic failure, unspecified without coma: Secondary | ICD-10-CM | POA: Diagnosis not present

## 2023-01-17 DIAGNOSIS — K7460 Unspecified cirrhosis of liver: Secondary | ICD-10-CM | POA: Diagnosis not present

## 2023-01-17 DIAGNOSIS — K769 Liver disease, unspecified: Secondary | ICD-10-CM | POA: Diagnosis not present

## 2023-01-17 DIAGNOSIS — R609 Edema, unspecified: Secondary | ICD-10-CM | POA: Diagnosis not present

## 2023-01-17 DIAGNOSIS — I509 Heart failure, unspecified: Secondary | ICD-10-CM | POA: Diagnosis not present

## 2023-01-17 LAB — BASIC METABOLIC PANEL
Anion gap: 12 (ref 5–15)
BUN: 38 mg/dL — ABNORMAL HIGH (ref 8–23)
CO2: 25 mmol/L (ref 22–32)
Calcium: 8.9 mg/dL (ref 8.9–10.3)
Chloride: 95 mmol/L — ABNORMAL LOW (ref 98–111)
Creatinine, Ser: 2.21 mg/dL — ABNORMAL HIGH (ref 0.61–1.24)
GFR, Estimated: 32 mL/min — ABNORMAL LOW (ref >60)
Glucose, Bld: 138 mg/dL — ABNORMAL HIGH (ref 70–99)
Potassium: 4.3 mmol/L (ref 3.5–5.1)
Sodium: 132 mmol/L — ABNORMAL LOW (ref 135–145)

## 2023-01-21 ENCOUNTER — Telehealth (HOSPITAL_COMMUNITY): Payer: Self-pay | Admitting: Cardiology

## 2023-01-21 ENCOUNTER — Emergency Department (HOSPITAL_COMMUNITY): Payer: Medicare HMO

## 2023-01-21 ENCOUNTER — Inpatient Hospital Stay (HOSPITAL_COMMUNITY)
Admission: EM | Admit: 2023-01-21 | Discharge: 2023-01-30 | DRG: 286 | Disposition: A | Discharge status: Home Health | Payer: Medicare HMO | Source: Ambulatory Visit | Attending: Internal Medicine | Admitting: Internal Medicine

## 2023-01-21 ENCOUNTER — Encounter (HOSPITAL_COMMUNITY): Payer: Self-pay | Admitting: Emergency Medicine

## 2023-01-21 ENCOUNTER — Other Ambulatory Visit: Payer: Self-pay

## 2023-01-21 DIAGNOSIS — E66813 Obesity, class 3: Secondary | ICD-10-CM | POA: Diagnosis not present

## 2023-01-21 DIAGNOSIS — I272 Pulmonary hypertension, unspecified: Secondary | ICD-10-CM | POA: Diagnosis present

## 2023-01-21 DIAGNOSIS — I130 Hypertensive heart and chronic kidney disease with heart failure and stage 1 through stage 4 chronic kidney disease, or unspecified chronic kidney disease: Principal | ICD-10-CM | POA: Diagnosis present

## 2023-01-21 DIAGNOSIS — R188 Other ascites: Secondary | ICD-10-CM | POA: Diagnosis not present

## 2023-01-21 DIAGNOSIS — Z8249 Family history of ischemic heart disease and other diseases of the circulatory system: Secondary | ICD-10-CM

## 2023-01-21 DIAGNOSIS — F32A Depression, unspecified: Secondary | ICD-10-CM | POA: Diagnosis present

## 2023-01-21 DIAGNOSIS — Z6841 Body Mass Index (BMI) 40.0 and over, adult: Secondary | ICD-10-CM | POA: Diagnosis not present

## 2023-01-21 DIAGNOSIS — D509 Iron deficiency anemia, unspecified: Secondary | ICD-10-CM | POA: Diagnosis present

## 2023-01-21 DIAGNOSIS — K219 Gastro-esophageal reflux disease without esophagitis: Secondary | ICD-10-CM | POA: Diagnosis present

## 2023-01-21 DIAGNOSIS — I471 Supraventricular tachycardia, unspecified: Secondary | ICD-10-CM | POA: Diagnosis not present

## 2023-01-21 DIAGNOSIS — Z66 Do not resuscitate: Secondary | ICD-10-CM | POA: Diagnosis not present

## 2023-01-21 DIAGNOSIS — K5900 Constipation, unspecified: Secondary | ICD-10-CM | POA: Diagnosis not present

## 2023-01-21 DIAGNOSIS — Z79899 Other long term (current) drug therapy: Secondary | ICD-10-CM

## 2023-01-21 DIAGNOSIS — E039 Hypothyroidism, unspecified: Secondary | ICD-10-CM | POA: Diagnosis present

## 2023-01-21 DIAGNOSIS — J449 Chronic obstructive pulmonary disease, unspecified: Secondary | ICD-10-CM

## 2023-01-21 DIAGNOSIS — K7460 Unspecified cirrhosis of liver: Secondary | ICD-10-CM | POA: Diagnosis not present

## 2023-01-21 DIAGNOSIS — E1122 Type 2 diabetes mellitus with diabetic chronic kidney disease: Secondary | ICD-10-CM | POA: Diagnosis not present

## 2023-01-21 DIAGNOSIS — E8779 Other fluid overload: Secondary | ICD-10-CM

## 2023-01-21 DIAGNOSIS — D696 Thrombocytopenia, unspecified: Secondary | ICD-10-CM | POA: Diagnosis not present

## 2023-01-21 DIAGNOSIS — J4489 Other specified chronic obstructive pulmonary disease: Secondary | ICD-10-CM | POA: Diagnosis present

## 2023-01-21 DIAGNOSIS — G4733 Obstructive sleep apnea (adult) (pediatric): Secondary | ICD-10-CM | POA: Diagnosis present

## 2023-01-21 DIAGNOSIS — Z9049 Acquired absence of other specified parts of digestive tract: Secondary | ICD-10-CM

## 2023-01-21 DIAGNOSIS — Z515 Encounter for palliative care: Secondary | ICD-10-CM

## 2023-01-21 DIAGNOSIS — I10 Essential (primary) hypertension: Secondary | ICD-10-CM | POA: Diagnosis not present

## 2023-01-21 DIAGNOSIS — R079 Chest pain, unspecified: Secondary | ICD-10-CM | POA: Diagnosis not present

## 2023-01-21 DIAGNOSIS — Z800 Family history of malignant neoplasm of digestive organs: Secondary | ICD-10-CM

## 2023-01-21 DIAGNOSIS — K7581 Nonalcoholic steatohepatitis (NASH): Secondary | ICD-10-CM | POA: Diagnosis present

## 2023-01-21 DIAGNOSIS — I110 Hypertensive heart disease with heart failure: Secondary | ICD-10-CM | POA: Diagnosis not present

## 2023-01-21 DIAGNOSIS — N179 Acute kidney failure, unspecified: Secondary | ICD-10-CM | POA: Diagnosis not present

## 2023-01-21 DIAGNOSIS — I5043 Acute on chronic combined systolic (congestive) and diastolic (congestive) heart failure: Secondary | ICD-10-CM | POA: Diagnosis not present

## 2023-01-21 DIAGNOSIS — F418 Other specified anxiety disorders: Secondary | ICD-10-CM | POA: Diagnosis not present

## 2023-01-21 DIAGNOSIS — M549 Dorsalgia, unspecified: Secondary | ICD-10-CM | POA: Diagnosis not present

## 2023-01-21 DIAGNOSIS — Z79890 Hormone replacement therapy (postmenopausal): Secondary | ICD-10-CM

## 2023-01-21 DIAGNOSIS — I509 Heart failure, unspecified: Secondary | ICD-10-CM | POA: Diagnosis not present

## 2023-01-21 DIAGNOSIS — R609 Edema, unspecified: Principal | ICD-10-CM

## 2023-01-21 DIAGNOSIS — F419 Anxiety disorder, unspecified: Secondary | ICD-10-CM | POA: Diagnosis present

## 2023-01-21 DIAGNOSIS — Z87891 Personal history of nicotine dependence: Secondary | ICD-10-CM

## 2023-01-21 DIAGNOSIS — E876 Hypokalemia: Secondary | ICD-10-CM | POA: Diagnosis not present

## 2023-01-21 DIAGNOSIS — D6959 Other secondary thrombocytopenia: Secondary | ICD-10-CM | POA: Diagnosis not present

## 2023-01-21 DIAGNOSIS — E119 Type 2 diabetes mellitus without complications: Secondary | ICD-10-CM

## 2023-01-21 DIAGNOSIS — N1832 Chronic kidney disease, stage 3b: Secondary | ICD-10-CM | POA: Diagnosis not present

## 2023-01-21 DIAGNOSIS — I5033 Acute on chronic diastolic (congestive) heart failure: Secondary | ICD-10-CM | POA: Diagnosis present

## 2023-01-21 DIAGNOSIS — Z7189 Other specified counseling: Secondary | ICD-10-CM | POA: Diagnosis not present

## 2023-01-21 DIAGNOSIS — Z711 Person with feared health complaint in whom no diagnosis is made: Secondary | ICD-10-CM | POA: Diagnosis not present

## 2023-01-21 DIAGNOSIS — E8770 Fluid overload, unspecified: Secondary | ICD-10-CM | POA: Diagnosis present

## 2023-01-21 DIAGNOSIS — Z789 Other specified health status: Secondary | ICD-10-CM | POA: Diagnosis not present

## 2023-01-21 LAB — MAGNESIUM: Magnesium: 2.4 mg/dL (ref 1.7–2.4)

## 2023-01-21 LAB — HEPATIC FUNCTION PANEL
ALT: 23 U/L (ref 0–44)
AST: 53 U/L — ABNORMAL HIGH (ref 15–41)
Albumin: 2.6 g/dL — ABNORMAL LOW (ref 3.5–5.0)
Alkaline Phosphatase: 87 U/L (ref 38–126)
Bilirubin, Direct: 0.9 mg/dL — ABNORMAL HIGH (ref 0.0–0.2)
Indirect Bilirubin: 2.4 mg/dL — ABNORMAL HIGH (ref 0.3–0.9)
Total Bilirubin: 3.3 mg/dL — ABNORMAL HIGH (ref 0.3–1.2)
Total Protein: 6.2 g/dL — ABNORMAL LOW (ref 6.5–8.1)

## 2023-01-21 LAB — CBC
HCT: 30.5 % — ABNORMAL LOW (ref 39.0–52.0)
Hemoglobin: 9.7 g/dL — ABNORMAL LOW (ref 13.0–17.0)
MCH: 27.6 pg (ref 26.0–34.0)
MCHC: 31.8 g/dL (ref 30.0–36.0)
MCV: 86.9 fL (ref 80.0–100.0)
Platelets: 68 10*3/uL — ABNORMAL LOW (ref 150–400)
RBC: 3.51 MIL/uL — ABNORMAL LOW (ref 4.22–5.81)
RDW: 15.3 % (ref 11.5–15.5)
WBC: 5 10*3/uL (ref 4.0–10.5)
nRBC: 0 % (ref 0.0–0.2)

## 2023-01-21 LAB — BASIC METABOLIC PANEL
Anion gap: 9 (ref 5–15)
BUN: 31 mg/dL — ABNORMAL HIGH (ref 8–23)
CO2: 25 mmol/L (ref 22–32)
Calcium: 8.6 mg/dL — ABNORMAL LOW (ref 8.9–10.3)
Chloride: 97 mmol/L — ABNORMAL LOW (ref 98–111)
Creatinine, Ser: 1.96 mg/dL — ABNORMAL HIGH (ref 0.61–1.24)
GFR, Estimated: 37 mL/min — ABNORMAL LOW (ref >60)
Glucose, Bld: 143 mg/dL — ABNORMAL HIGH (ref 70–99)
Potassium: 4.3 mmol/L (ref 3.5–5.1)
Sodium: 131 mmol/L — ABNORMAL LOW (ref 135–145)

## 2023-01-21 LAB — BRAIN NATRIURETIC PEPTIDE: B Natriuretic Peptide: 31.7 pg/mL (ref 0.0–100.0)

## 2023-01-21 LAB — TROPONIN I (HIGH SENSITIVITY)
Troponin I (High Sensitivity): 10 ng/L (ref <18)
Troponin I (High Sensitivity): 12 ng/L (ref <18)

## 2023-01-21 LAB — CBG MONITORING, ED: Glucose-Capillary: 142 mg/dL — ABNORMAL HIGH (ref 70–99)

## 2023-01-21 MED ORDER — POLYETHYLENE GLYCOL 3350 17 G PO PACK: 17.0000 g | PACK | Freq: Every day | ORAL | Status: DC | PRN

## 2023-01-21 MED ORDER — INSULIN ASPART 100 UNIT/ML IJ SOLN: 0.0000 [IU] | Freq: Every day | INTRAMUSCULAR | Status: DC

## 2023-01-21 MED ORDER — HYDROXYZINE PAMOATE 50 MG PO CAPS: 50.0000 mg | ORAL_CAPSULE | Freq: Three times a day (TID) | ORAL | Status: DC | PRN

## 2023-01-21 MED ORDER — GABAPENTIN 100 MG PO CAPS
100.0000 mg | ORAL_CAPSULE | Freq: Every day | ORAL | Status: DC | PRN
  Administered 2023-01-22 – 2023-01-26 (×3): 100 mg via ORAL
  Filled 2023-01-21 (×3): qty 1

## 2023-01-21 MED ORDER — DAPAGLIFLOZIN PROPANEDIOL 10 MG PO TABS
10.0000 mg | ORAL_TABLET | Freq: Every day | ORAL | Status: DC
  Administered 2023-01-22 – 2023-01-30 (×9): 10 mg via ORAL
  Filled 2023-01-21 (×9): qty 1

## 2023-01-21 MED ORDER — LEVOTHYROXINE SODIUM 88 MCG PO TABS
88.0000 ug | ORAL_TABLET | Freq: Every day | ORAL | Status: DC
  Administered 2023-01-22 – 2023-01-30 (×9): 88 ug via ORAL
  Filled 2023-01-21 (×9): qty 1

## 2023-01-21 MED ORDER — TRAZODONE HCL 50 MG PO TABS
50.0000 mg | ORAL_TABLET | Freq: Every day | ORAL | Status: DC
  Administered 2023-01-21 – 2023-01-29 (×8): 50 mg via ORAL
  Filled 2023-01-21 (×9): qty 1

## 2023-01-21 MED ORDER — SODIUM CHLORIDE 0.9% FLUSH
3.0000 mL | Freq: Two times a day (BID) | INTRAVENOUS | Status: DC
  Administered 2023-01-21 – 2023-01-30 (×15): 3 mL via INTRAVENOUS

## 2023-01-21 MED ORDER — FUROSEMIDE 10 MG/ML IJ SOLN
120.0000 mg | Freq: Two times a day (BID) | INTRAVENOUS | Status: DC
  Administered 2023-01-21: 120 mg via INTRAVENOUS
  Filled 2023-01-21: qty 10
  Filled 2023-01-21 (×2): qty 12

## 2023-01-21 MED ORDER — ALBUTEROL SULFATE (2.5 MG/3ML) 0.083% IN NEBU: 3.0000 mL | INHALATION_SOLUTION | Freq: Four times a day (QID) | RESPIRATORY_TRACT | Status: DC | PRN

## 2023-01-21 MED ORDER — ACETAMINOPHEN 650 MG RE SUPP: 650.0000 mg | Freq: Four times a day (QID) | RECTAL | Status: DC | PRN

## 2023-01-21 MED ORDER — INSULIN ASPART 100 UNIT/ML IJ SOLN
0.0000 [IU] | Freq: Three times a day (TID) | INTRAMUSCULAR | Status: DC
  Administered 2023-01-22 (×2): 2 [IU] via SUBCUTANEOUS
  Administered 2023-01-22 – 2023-01-23 (×3): 3 [IU] via SUBCUTANEOUS
  Administered 2023-01-24 – 2023-01-25 (×4): 2 [IU] via SUBCUTANEOUS
  Administered 2023-01-25: 3 [IU] via SUBCUTANEOUS
  Administered 2023-01-26 – 2023-01-30 (×7): 2 [IU] via SUBCUTANEOUS

## 2023-01-21 MED ORDER — CARVEDILOL 6.25 MG PO TABS
6.2500 mg | ORAL_TABLET | Freq: Two times a day (BID) | ORAL | Status: DC
  Administered 2023-01-21 – 2023-01-25 (×7): 6.25 mg via ORAL
  Filled 2023-01-21 (×2): qty 2
  Filled 2023-01-21 (×5): qty 1

## 2023-01-21 MED ORDER — SPIRONOLACTONE 25 MG PO TABS
100.0000 mg | ORAL_TABLET | Freq: Every day | ORAL | Status: DC
  Administered 2023-01-22 – 2023-01-30 (×8): 100 mg via ORAL
  Filled 2023-01-21 (×6): qty 4
  Filled 2023-01-21: qty 8
  Filled 2023-01-21: qty 4

## 2023-01-21 MED ORDER — PANTOPRAZOLE SODIUM 40 MG PO TBEC
40.0000 mg | DELAYED_RELEASE_TABLET | Freq: Every day | ORAL | Status: DC
  Administered 2023-01-22 – 2023-01-30 (×9): 40 mg via ORAL
  Filled 2023-01-21 (×9): qty 1

## 2023-01-21 MED ORDER — RIFAXIMIN 550 MG PO TABS
550.0000 mg | ORAL_TABLET | Freq: Two times a day (BID) | ORAL | Status: DC
  Administered 2023-01-21 – 2023-01-30 (×18): 550 mg via ORAL
  Filled 2023-01-21 (×19): qty 1

## 2023-01-21 MED ORDER — LACTULOSE 10 GM/15ML PO SOLN
20.0000 g | Freq: Two times a day (BID) | ORAL | Status: DC
  Administered 2023-01-21 – 2023-01-22 (×3): 20 g via ORAL
  Filled 2023-01-21 (×3): qty 30

## 2023-01-21 MED ORDER — ACETAMINOPHEN 325 MG PO TABS
650.0000 mg | ORAL_TABLET | Freq: Four times a day (QID) | ORAL | Status: DC | PRN
  Administered 2023-01-22 – 2023-01-26 (×4): 650 mg via ORAL
  Filled 2023-01-21 (×4): qty 2

## 2023-01-21 MED ORDER — ENOXAPARIN SODIUM 40 MG/0.4ML IJ SOSY: 40.0000 mg | PREFILLED_SYRINGE | INTRAMUSCULAR | Status: DC

## 2023-01-21 MED ORDER — SERTRALINE HCL 100 MG PO TABS
100.0000 mg | ORAL_TABLET | Freq: Every day | ORAL | Status: DC
  Administered 2023-01-22 – 2023-01-30 (×9): 100 mg via ORAL
  Filled 2023-01-21 (×9): qty 1

## 2023-01-21 NOTE — ED Provider Notes (Signed)
[place] EMERGENCY DEPARTMENT AT [facility] Provider Note   CSN: [id number] Arrival date & time: 01/21/23  1333     History  Chief Complaint  Patient presents with   Shortness of Breath    Daniel Crawford is a 67 y.o. male.  With a past medical history of Elita Boone, cirrhosis, heart failure, CKD and type 2 diabetes who presents to the ED for edema.  Patient reports persistently increasing peripheral edema despite home diuretic use with recent up titration.  Increase edema through his lower extremities and abdomen and increased shortness of breath with exertion.  He reports a 40 pound weight gain over the last 2 to 3 weeks.  No chest pain, fevers, chills, nausea vomiting or dysuria.  Has had multiple admissions for management of similar episodes of increased edema.   Shortness of Breath      Home Medications Prior to Admission medications   Medication Sig Start Date End Date Taking? Authorizing Provider  acetaminophen (TYLENOL) 500 MG tablet Take 1,000 mg by mouth every 6 (six) hours as needed for moderate pain.    [provider]  albuterol (VENTOLIN HFA) 108 (90 Base) MCG/ACT inhaler Inhale 2 puffs into the lungs every 6 (six) hours as needed for wheezing or shortness of breath. 05/01/22   Oretha Milch, MD  carvedilol (COREG) 6.25 MG tablet Take 6.25 mg by mouth 2 (two) times daily with a meal.    [provider]  dapagliflozin propanediol (FARXIGA) 10 MG TABS tablet Take 1 tablet (10 mg total) by mouth daily before breakfast. 11/28/22   Laurey Morale, MD  gabapentin (NEURONTIN) 100 MG capsule Take 100 mg by mouth daily as needed (pain). 02/20/21   [provider]  hydrOXYzine (VISTARIL) 50 MG capsule Take 1 capsule (50 mg total) by mouth 3 (three) times daily as needed for anxiety or nausea. 01/25/22   Shon Hale, MD  lactose free nutrition (BOOST) LIQD Take 237 mLs by mouth every evening.    [provider]  lactulose (CHRONULAC) 10  GM/15ML solution Take 30 mLs (20 g total) by mouth 2 (two) times daily. 10/26/22 01/24/23  Vassie Loll, MD  levothyroxine (SYNTHROID) 88 MCG tablet Take 1 tablet (88 mcg total) by mouth daily before breakfast. 01/10/23   Robbie Lis M, PA-C  metolazone (ZAROXOLYN) 2.5 MG tablet Take 1 tablet (2.5 mg total) by mouth 2 (two) times a week. 01/10/23   Robbie Lis M, PA-C  ondansetron (ZOFRAN) 4 MG tablet Take 4 mg by mouth 2 (two) times daily as needed. 10/28/22   [provider]  pantoprazole (PROTONIX) 40 MG tablet Take 1 tablet (40 mg total) by mouth daily. 01/25/22   Shon Hale, MD  polyethylene glycol (MIRALAX / GLYCOLAX) 17 g packet Take 17 g by mouth daily.    [provider]  potassium chloride SA (KLOR-CON M) 20 MEQ tablet Take 2 tablets (40 mEq total) by mouth 2 (two) times daily. 07/25/22   Laurey Morale, MD  rifaximin (XIFAXAN) 550 MG TABS tablet Take 1 tablet (550 mg total) by mouth 2 (two) times daily. 01/25/22   Shon Hale, MD  sertraline (ZOLOFT) 100 MG tablet Take 100 mg by mouth daily. 09/24/22   [provider]  spironolactone (ALDACTONE) 100 MG tablet Take 100 mg by mouth daily.    [provider]  torsemide 40 MG TABS Take 80 mg by mouth 2 (two) times daily. Patient taking differently: Take 160 mg by mouth 2 (two)  times daily. Take 4 tablets (160mg ) by mouth in the morning and 4 tablets (160mg ) by mouthin the evening. 12/25/22 01/24/23  Tyrone Nine, MD  traZODone (DESYREL) 50 MG tablet Take 1 tablet (50 mg total) by mouth at bedtime. 01/25/22   Shon Hale, MD      Allergies    Patient has no known allergies.    Review of Systems   Review of Systems  Respiratory:  Positive for shortness of breath.     Physical Exam Updated Vital Signs BP 127/65 (BP Location: Left Arm)   Pulse 98   Temp 99 F (37.2 C) (Oral)   Resp 18   SpO2 100%  Physical Exam Vitals and nursing note reviewed.  HENT:     Head:  Normocephalic and atraumatic.  Eyes:     Pupils: Pupils are equal, round, and reactive to light.  Cardiovascular:     Rate and Rhythm: Normal rate and regular rhythm.  Pulmonary:     Effort: Pulmonary effort is normal.     Breath sounds: Normal breath sounds.  Abdominal:     Palpations: Abdomen is soft.     Tenderness: There is no abdominal tenderness.     Comments: Edema but no fluid wave  Musculoskeletal:     Comments: 2+ tense edema extending up through the entirety of bilateral lower extremities  Skin:    General: Skin is warm and dry.  Neurological:     Mental Status: He is alert.  Psychiatric:        Mood and Affect: Mood normal.     ED Results / Procedures / Treatments   Labs (all labs ordered are listed, but only abnormal results are displayed) Labs Reviewed  BASIC METABOLIC PANEL - Abnormal; Notable for the following components:      Result Value   Sodium 131 (*)    Chloride 97 (*)    Glucose, Bld 143 (*)    BUN 31 (*)    Creatinine, Ser 1.96 (*)    Calcium 8.6 (*)    GFR, Estimated 37 (*)    All other components within normal limits  CBC - Abnormal; Notable for the following components:   RBC 3.51 (*)    Hemoglobin 9.7 (*)    HCT 30.5 (*)    Platelets 68 (*)    All other components within normal limits  BRAIN NATRIURETIC PEPTIDE  HEPATIC FUNCTION PANEL  MAGNESIUM  PROTIME-INR  TROPONIN I (HIGH SENSITIVITY)  TROPONIN I (HIGH SENSITIVITY)    EKG EKG Interpretation Date/Time:  Monday January 21 2023 13:54:00 EDT Ventricular Rate:  98 PR Interval:  208 QRS Duration:  90 QT Interval:  390 QTC Calculation: 497 R Axis:   12  Text Interpretation: Normal sinus rhythm Anterolateral infarct , age undetermined Abnormal ECG Similar when compared to ECG of26-SEP-2024 11:36, Confirmed by Estelle June [phone]) on 01/21/2023 3:36:22 PM  Radiology No results found.  Procedures Procedures    Medications Ordered in ED Medications  rifaximin (XIFAXAN)  tablet 550 mg (has no administration in time range)  spironolactone (ALDACTONE) tablet 100 mg (has no administration in time range)  carvedilol (COREG) tablet 6.25 mg (has no administration in time range)  sertraline (ZOLOFT) tablet 100 mg (has no administration in time range)  hydrOXYzine (VISTARIL) capsule 50 mg (has no administration in time range)  traZODone (DESYREL) tablet 50 mg (has no administration in time range)  levothyroxine (SYNTHROID) tablet 88 mcg (has no administration in time range)  dapagliflozin propanediol (  FARXIGA) tablet 10 mg (has no administration in time range)  lactulose (CHRONULAC) 10 GM/15ML solution 20 g (has no administration in time range)  pantoprazole (PROTONIX) EC tablet 40 mg (has no administration in time range)  gabapentin (NEURONTIN) capsule 100 mg (has no administration in time range)  albuterol (VENTOLIN HFA) 108 (90 Base) MCG/ACT inhaler 2 puff (has no administration in time range)  enoxaparin (LOVENOX) injection 40 mg (has no administration in time range)  furosemide (LASIX) 120 mg in dextrose 5 % 50 mL IVPB (has no administration in time range)  sodium chloride flush (NS) 0.9 % injection 3 mL (has no administration in time range)  acetaminophen (TYLENOL) tablet 650 mg (has no administration in time range)    Or  acetaminophen (TYLENOL) suppository 650 mg (has no administration in time range)  polyethylene glycol (MIRALAX / GLYCOLAX) packet 17 g (has no administration in time range)    ED Course/ Medical Decision Making/ A&P Clinical Course as of 01/21/23 1640  Mon Jan 21, 2023  1639 Chest x-ray showed some congestion but no overt pulmonary edema or significant effusions.  BNP at baseline.  Manger of laboratory workup unremarkable.  Renal function at baseline.  Discussed admitting hospitalist except patient for admission for increasing peripheral edema and inpatient diuresis [MP]    Clinical Course User Index [MP] Royanne Foots, DO                                  Medical Decision Making 67 year old male with extensive history as above returns for increased peripheral edema and 40 pound weight gain in the last 2 to 3 weeks.  Associated symptoms include leg discomfort and increased dyspnea on exertion with great difficulty ambulating.  No shortness of breath at rest.  No fevers chills or chest pain.  Is afebrile and normotensive here satting well on room air.  Exam notable for severe bilateral lower extremity edema with abdominal edema.  No fluid wave that feels amenable to abdominal paracentesis.  He has not required abdominal paracentesis in the past.  Given that his primary care doctor has been unable to optimize his diuretic regimen and manage his fluid status as an outpatient he will likely require admission for diuresis and continued evaluation.  Suspect most likely etiology would be chronic heart failure complicated by NASH and cirrhosis.  Amount and/or Complexity of Data Reviewed Labs: ordered. Radiology: ordered.           Final Clinical Impression(s) / ED Diagnoses Final diagnoses:  Edema, unspecified type  Cirrhosis, non-alcoholic (HCC)  Congestive heart failure, unspecified HF chronicity, unspecified heart failure type [facility])    Rx / DC Orders ED Discharge Orders     None         Royanne Foots, DO 01/21/23 1640

## 2023-01-21 NOTE — Telephone Encounter (Signed)
Patient called to report 40 lb weight gain in the past two weeks, Reports he will come to ER for further evaluation

## 2023-01-21 NOTE — H&P (Signed)
History and Physical   Daniel Crawford WUJ:[id number] DOB: [birth year] DOA: 01/21/2023  PCP: [name], MD   Patient coming from: Home  Chief Complaint: Shortness of breath, edema  HPI: Daniel Crawford is a 67 y.o. male with medical history significant of hypertension, diabetes, GERD, hypothyroidism, CHF, OSA, obesity, NASH cirrhosis, anemia, depression, anxiety presenting with worsening edema and shortness of breath.  Patient was admitted from 9/26 until 10/1 at [facility] for similar presentation.  Diuresed down to 370 pounds with an estimated dry weight of 360 pounds.  Not yet weight today.  Discharged on increased dose of torsemide 80 mg twice daily with metolazone twice weekly.  Also discharged on doxycycline for questionable cellulitis.  Patient now presenting with reaccumulation of edema and about 40 pounds of weight gain since discharge.  Also reporting increased dyspnea on exertion.  Feels very similar to previous presentation.  Denies fevers, chills, chest pain, abdominal pain, constipation, diarrhea, nausea, vomiting.  Respiratory  ED Course: Vital signs in the ED stable.  Lab workup included BMP with sodium 131, chloride 97, BUN 31, creatinine stable 1.96 and a calcium 8.6.  CBC with hemoglobin stable 9.7, platelets stable at 68.  Troponin normal.  BMP normal at 31 but this Baudoin be falsely low given significant obesity.  Chest x-ray is pending.  No intervention thus far in the ED.  Review of Systems: As per HPI otherwise all other systems reviewed and are negative.  Past Medical History:  Diagnosis Date   Acute pancreatitis 10/02/2022   Anasarca 01/23/2022   Anemia    Anxiety    Asthma    CHF (congestive heart failure) (HCC)    CKD (chronic kidney disease)    D-dimer, elevated 01/22/2022   Decompensation of cirrhosis of liver (HCC) 05/21/2022   Depression    Diabetes mellitus without complication (HCC)    Dyspnea    Hepatic encephalopathy (HCC) 07/10/2022    Hypertension    Hypothyroidism    Liver cirrhosis secondary to NASH (HCC)    NASH (nonalcoholic steatohepatitis)    Other ascites 06/19/2019   Pancytopenia (HCC) 05/21/2022   Pre-diabetes    Spleen enlarged    Thrombocytopenia (HCC)     Past Surgical History:  Procedure Laterality Date   Bilateral hernia surgery     2006, 2007   CHOLECYSTECTOMY     2016    COLONOSCOPY WITH PROPOFOL N/A 06/28/2021   Procedure: COLONOSCOPY WITH PROPOFOL;  Surgeon: Malissa Hippo, MD;  [facility];  Service: Endoscopy;  Laterality: N/A;  1020   ESOPHAGEAL DILATION N/A 11/06/2019   Procedure: ESOPHAGEAL DILATION;  Surgeon: Dolores Frame, MD;  Location: [facility];  Service: Gastroenterology;  Laterality: N/A;   ESOPHAGOGASTRODUODENOSCOPY (EGD) WITH PROPOFOL N/A 11/06/2019   Procedure: ESOPHAGOGASTRODUODENOSCOPY (EGD) WITH PROPOFOL;  Surgeon: Dolores Frame, MD;  Location: [facility];  Service: Gastroenterology;  Laterality: N/A;  1045   IR RADIOLOGIST EVAL & MGMT  02/19/2022   LAPAROSCOPIC ASSISTED VENTRAL HERNIA REPAIR     Polyp removed     in January of 2018.    POLYPECTOMY  06/28/2021   Procedure: POLYPECTOMY INTESTINAL;  Surgeon: Malissa Hippo, MD;  Location: [facility];  Service: Endoscopy;;   RIGHT HEART CATH N/A 01/08/2022   Procedure: RIGHT HEART CATH;  Surgeon: Corky Crafts, MD;  Location: [facility];  Service: Cardiovascular;  Laterality: N/A;    Social History  reports that he quit smoking about 7 years  ago. His smoking use included cigarettes. He started smoking about 27 years ago. He has a 20 pack-year smoking history. He has been exposed to tobacco smoke. He has never used smokeless tobacco. He reports that he does not drink alcohol and does not use drugs.  No Known Allergies  Family History  Problem Relation Age of Onset   Liver cancer Mother    Heart disease Mother    Aneurysm Sister   Reviewed on admission  Prior to  Admission medications   Medication Sig Start Date End Date Taking? Authorizing Provider  acetaminophen (TYLENOL) 500 MG tablet Take 1,000 mg by mouth every 6 (six) hours as needed for moderate pain.    [provider]  albuterol (VENTOLIN HFA) 108 (90 Base) MCG/ACT inhaler Inhale 2 puffs into the lungs every 6 (six) hours as needed for wheezing or shortness of breath. 05/01/22   Oretha Milch, MD  carvedilol (COREG) 6.25 MG tablet Take 6.25 mg by mouth 2 (two) times daily with a meal.    [provider]  dapagliflozin propanediol (FARXIGA) 10 MG TABS tablet Take 1 tablet (10 mg total) by mouth daily before breakfast. 11/28/22   Laurey Morale, MD  gabapentin (NEURONTIN) 100 MG capsule Take 100 mg by mouth daily as needed (pain). 02/20/21   [provider]  hydrOXYzine (VISTARIL) 50 MG capsule Take 1 capsule (50 mg total) by mouth 3 (three) times daily as needed for anxiety or nausea. 01/25/22   Shon Hale, MD  lactose free nutrition (BOOST) LIQD Take 237 mLs by mouth every evening.    [provider]  lactulose (CHRONULAC) 10 GM/15ML solution Take 30 mLs (20 g total) by mouth 2 (two) times daily. 10/26/22 01/24/23  Vassie Loll, MD  levothyroxine (SYNTHROID) 88 MCG tablet Take 1 tablet (88 mcg total) by mouth daily before breakfast. 01/10/23   Robbie Lis M, PA-C  metolazone (ZAROXOLYN) 2.5 MG tablet Take 1 tablet (2.5 mg total) by mouth 2 (two) times a week. 01/10/23   Robbie Lis M, PA-C  ondansetron (ZOFRAN) 4 MG tablet Take 4 mg by mouth 2 (two) times daily as needed. 10/28/22   [provider]  pantoprazole (PROTONIX) 40 MG tablet Take 1 tablet (40 mg total) by mouth daily. 01/25/22   Shon Hale, MD  polyethylene glycol (MIRALAX / GLYCOLAX) 17 g packet Take 17 g by mouth daily.    [provider]  potassium chloride SA (KLOR-CON M) 20 MEQ tablet Take 2 tablets (40 mEq total) by mouth 2 (two) times daily. 07/25/22   Laurey Morale, MD  rifaximin (XIFAXAN) 550 MG TABS tablet Take 1 tablet (550 mg total) by mouth 2 (two) times daily. 01/25/22   Shon Hale, MD  sertraline (ZOLOFT) 100 MG tablet Take 100 mg by mouth daily. 09/24/22   [provider]  spironolactone (ALDACTONE) 100 MG tablet Take 100 mg by mouth daily.    [provider]  torsemide 40 MG TABS Take 80 mg by mouth 2 (two) times daily. Patient taking differently: Take 160 mg by mouth 2 (two) times daily. Take 4 tablets (160mg ) by mouth in the morning and 4 tablets (160mg ) by mouthin the evening. 12/25/22 01/24/23  Tyrone Nine, MD  traZODone (DESYREL) 50 MG tablet Take 1 tablet (50 mg total) by mouth at bedtime. 01/25/22   Shon Hale, MD    Physical Exam: Vitals:   01/21/23 1356  BP: 127/65  Pulse: 98  Resp: 18  Temp: 99  F (37.2 C)  TempSrc: Oral  SpO2: 100%    Physical Exam Constitutional:      General: He is not in acute distress.    Appearance: Normal appearance. He is obese.  HENT:     Head: Normocephalic and atraumatic.     Mouth/Throat:     Mouth: Mucous membranes are moist.     Pharynx: Oropharynx is clear.  Eyes:     Extraocular Movements: Extraocular movements intact.     Pupils: Pupils are equal, round, and reactive to light.  Cardiovascular:     Rate and Rhythm: Normal rate and regular rhythm.     Pulses: Normal pulses.     Heart sounds: Normal heart sounds.  Pulmonary:     Effort: Pulmonary effort is normal. No respiratory distress.     Breath sounds: Decreased breath sounds present.  Abdominal:     General: Bowel sounds are normal. There is no distension.     Palpations: Abdomen is soft.     Tenderness: There is no abdominal tenderness.     Comments: Edema  Musculoskeletal:        General: No swelling or deformity.     Right lower leg: Edema present.     Left lower leg: Edema present.  Skin:    General: Skin is warm and dry.  Neurological:     General: No focal deficit present.      Mental Status: Mental status is at baseline.    Labs on Admission: I have personally reviewed following labs and imaging studies  CBC: Recent Labs  Lab 01/21/23 1359  WBC 5.0  HGB 9.7*  HCT 30.5*  MCV 86.9  PLT 68*    Basic Metabolic Panel: Recent Labs  Lab 01/17/23 1318 01/21/23 1359  NA 132* 131*  K 4.3 4.3  CL 95* 97*  CO2 25 25  GLUCOSE 138* 143*  BUN 38* 31*  CREATININE 2.21* 1.96*  CALCIUM 8.9 8.6*    GFR: Estimated Creatinine Clearance: 62.3 mL/min (A) (by C-G formula based on SCr of 1.96 mg/dL (H)).  Liver Function Tests: No results for input(s): "AST", "ALT", "ALKPHOS", "BILITOT", "PROT", "ALBUMIN" in the last 168 hours.  Urine analysis:    Component Value Date/Time   COLORURINE YELLOW 10/20/2022 1045   APPEARANCEUR CLEAR 10/20/2022 1045   LABSPEC 1.008 10/20/2022 1045   PHURINE 7.0 10/20/2022 1045   GLUCOSEU NEGATIVE 10/20/2022 1045   HGBUR NEGATIVE 10/20/2022 1045   BILIRUBINUR NEGATIVE 10/20/2022 1045   KETONESUR NEGATIVE 10/20/2022 1045   PROTEINUR NEGATIVE 10/20/2022 1045   NITRITE NEGATIVE 10/20/2022 1045   LEUKOCYTESUR NEGATIVE 10/20/2022 1045    Radiological Exams on Admission: No results found.  EKG: Independently reviewed.  Sinus rhythm at 98 bpm.  Nonspecific T wave flattening.  Prolonged QTc at 497.  Assessment/Plan Active Problems:   Essential hypertension   Morbid obesity (HCC)   T2DM (type 2 diabetes mellitus) (HCC)   OSA (obstructive sleep apnea)   Liver cirrhosis secondary to NASH (HCC)   Acquired hypothyroidism   Thrombocytopenia (HCC)   Depression with anxiety   Volume overload   Iron deficiency anemia   GERD (gastroesophageal reflux disease)   COPD (chronic obstructive pulmonary disease) (HCC)   Volume overload Acute on chronic diastolic CHF > Patient presenting with significant edema and worsening dyspnea on exertion.  Has had prior presentations of the same in the setting of cirrhosis and CHF. > On exam edema  throughout lower extremities and abdomen.  Distant breath sounds. >  BNP is normal to this is likely falsely low in the setting of significant obesity and it is similar to previous presentations. > Last echo was last month with EF 55%, normal diastolic function, normal RV function.  Follows with advanced heart failure outpatient. > Recent admission for the same At [facility] several weeks ago.  Discharged on increased dose of Lasix 80 mg twice daily and metolazone twice weekly.  Despite this, 40 pounds of weight gain since that time. - Monitor on telemetry - Heart failure consulted/notified of patient admission given that they wanted him to be admitted and followed by their service during previous admission. Will see 10/29 AM. - Lasix 120 mg IV twice daily - Strict I's and O's, daily weights - No repeat echo as this was done recently - Fluid restricted diet - Trend renal function and electrolytes - Check magnesium now - Continue home spironolactone, Farxiga, carvedilol  NASH cirrhosis > With volume overload as above possibly combined effects between CHF and cirrhosis. - Check LFTs, PT/INR - Trend liver functions - Continue home spironolactone, lactulose, rifaximin - Lasix as above  Hypertension - Lasix as above - Continue home spironolactone, carvedilol  Diabetes - SSI  GERD - Continue home PPI  Hypothyroidism - Continue home Synthroid  OSA - Does not use CPAP  Obesity - Noted  Anemia Thrombocytopenia > Hemoglobin stable at 9.7.  Platelets stable at 68. - Continue to trend CBC - No chemo DVT prophylaxis  Depression Anxiety - Continue home sertraline, hydroxyzine, trazodone  DVT prophylaxis: SCDs Code Status:   For Family Communication:  None on admission Disposition Plan:   Patient is from:  Home  Anticipated DC to:  Home  Anticipated DC date:  4 to 7 days  Anticipated DC barriers: None  Consults called:  Advance heart failure  Admission status:  Inpatient,  telemetry  Severity of Illness: The appropriate patient status for this patient is INPATIENT. Inpatient status is judged to be reasonable and necessary in order to provide the required intensity of service to ensure the patient's safety. The patient's presenting symptoms, physical exam findings, and initial radiographic and laboratory data in the context of their chronic comorbidities is felt to place them at high risk for further clinical deterioration. Furthermore, it is not anticipated that the patient will be medically stable for discharge from the hospital within 2 midnights of admission.   * I certify that at the point of admission it is my clinical judgment that the patient will require inpatient hospital care spanning beyond 2 midnights from the point of admission due to high intensity of service, high risk for further deterioration and high frequency of surveillance required.Synetta Fail MD [facility]  How to contact the [facility] Attending or Consulting provider 7A - 7P or covering provider during after hours 7P -7A, for this patient?   Check the care team in [facility] and look for a) attending/consulting [facility] provider listed and b) the [facility] team listed Log into [email or web address] and use Brenda's universal password to access. If you do not have the password, please contact the hospital operator. Locate the [facility] provider you are looking for under [facility] and page to a number that you can be directly reached. If you still have difficulty reaching the provider, please page the [facility] (Director on Call) for the Hospitalists listed on amion for assistance.  01/21/2023, 4:25 PM

## 2023-01-21 NOTE — ED Provider Triage Note (Signed)
Emergency Medicine Provider Triage Evaluation Note  Daniel Crawford , a 67 y.o. male  was evaluated in triage.  Pt complains of shortness of breath weight gain.  Called his cardiologist and encouraged him to come to the emergency department.  Has been taking his diuretics as prescribed but states they have not worked.  No chest pain.  Review of Systems  Positive: As above Negative: As above  Physical Exam  BP 127/65 (BP Location: Left Arm)   Pulse 98   Temp 99 F (37.2 C) (Oral)   Resp 18   SpO2 100%  Gen:   Awake, no distress, obese Resp:  Normal effort  MSK:   Moves extremities without difficulty  Other:    Medical Decision Making  Medically screening exam initiated at 1:57 PM.  Appropriate orders placed.  Daniel Crawford was informed that the remainder of the evaluation will be completed by another provider, this initial triage assessment does not replace that evaluation, and the importance of remaining in the ED until their evaluation is complete.  Workup initiated   Michelle Piper, Cordelia Poche 01/21/23 1358

## 2023-01-21 NOTE — ED Notes (Signed)
Report received from Nelly Laurence. RN. Assumed care of pt at this time.

## 2023-01-21 NOTE — ED Notes (Signed)
Per EDP pt does not need second trop.

## 2023-01-21 NOTE — ED Triage Notes (Signed)
Pt here sent from MD office for an 45 lb increase in weight over the last 2 weeks , pt's MD has increased his diuretic but it has not been working

## 2023-01-22 DIAGNOSIS — Z515 Encounter for palliative care: Secondary | ICD-10-CM

## 2023-01-22 DIAGNOSIS — K7460 Unspecified cirrhosis of liver: Secondary | ICD-10-CM | POA: Diagnosis not present

## 2023-01-22 DIAGNOSIS — Z7189 Other specified counseling: Secondary | ICD-10-CM | POA: Diagnosis not present

## 2023-01-22 DIAGNOSIS — I5033 Acute on chronic diastolic (congestive) heart failure: Secondary | ICD-10-CM | POA: Diagnosis not present

## 2023-01-22 LAB — RENAL FUNCTION PANEL
Albumin: 2.8 g/dL — ABNORMAL LOW (ref 3.5–5.0)
Anion gap: 11 (ref 5–15)
BUN: 35 mg/dL — ABNORMAL HIGH (ref 8–23)
CO2: 28 mmol/L (ref 22–32)
Calcium: 9.1 mg/dL (ref 8.9–10.3)
Chloride: 96 mmol/L — ABNORMAL LOW (ref 98–111)
Creatinine, Ser: 2.27 mg/dL — ABNORMAL HIGH (ref 0.61–1.24)
GFR, Estimated: 31 mL/min — ABNORMAL LOW (ref >60)
Glucose, Bld: 132 mg/dL — ABNORMAL HIGH (ref 70–99)
Phosphorus: 4.1 mg/dL (ref 2.5–4.6)
Potassium: 4.2 mmol/L (ref 3.5–5.1)
Sodium: 135 mmol/L (ref 135–145)

## 2023-01-22 LAB — COMPREHENSIVE METABOLIC PANEL
ALT: 22 U/L (ref 0–44)
AST: 57 U/L — ABNORMAL HIGH (ref 15–41)
Albumin: 2.6 g/dL — ABNORMAL LOW (ref 3.5–5.0)
Alkaline Phosphatase: 71 U/L (ref 38–126)
Anion gap: 10 (ref 5–15)
BUN: 35 mg/dL — ABNORMAL HIGH (ref 8–23)
CO2: 25 mmol/L (ref 22–32)
Calcium: 8.8 mg/dL — ABNORMAL LOW (ref 8.9–10.3)
Chloride: 100 mmol/L (ref 98–111)
Creatinine, Ser: 2.06 mg/dL — ABNORMAL HIGH (ref 0.61–1.24)
GFR, Estimated: 35 mL/min — ABNORMAL LOW (ref >60)
Glucose, Bld: 130 mg/dL — ABNORMAL HIGH (ref 70–99)
Potassium: 4.4 mmol/L (ref 3.5–5.1)
Sodium: 135 mmol/L (ref 135–145)
Total Bilirubin: 3.7 mg/dL — ABNORMAL HIGH (ref 0.3–1.2)
Total Protein: 6.2 g/dL — ABNORMAL LOW (ref 6.5–8.1)

## 2023-01-22 LAB — CBC
HCT: 27 % — ABNORMAL LOW (ref 39.0–52.0)
Hemoglobin: 8.5 g/dL — ABNORMAL LOW (ref 13.0–17.0)
MCH: 27.7 pg (ref 26.0–34.0)
MCHC: 31.5 g/dL (ref 30.0–36.0)
MCV: 87.9 fL (ref 80.0–100.0)
Platelets: 63 10*3/uL — ABNORMAL LOW (ref 150–400)
RBC: 3.07 MIL/uL — ABNORMAL LOW (ref 4.22–5.81)
RDW: 15.6 % — ABNORMAL HIGH (ref 11.5–15.5)
WBC: 4 10*3/uL (ref 4.0–10.5)
nRBC: 0 % (ref 0.0–0.2)

## 2023-01-22 LAB — GLUCOSE, CAPILLARY
Glucose-Capillary: 161 mg/dL — ABNORMAL HIGH (ref 70–99)
Glucose-Capillary: 124 mg/dL — ABNORMAL HIGH (ref 70–99)
Glucose-Capillary: 170 mg/dL — ABNORMAL HIGH (ref 70–99)

## 2023-01-22 LAB — AMMONIA: Ammonia: 60 umol/L — ABNORMAL HIGH (ref 9–35)

## 2023-01-22 LAB — PROTIME-INR
INR: 1.5 — ABNORMAL HIGH (ref 0.8–1.2)
Prothrombin Time: 17.9 s — ABNORMAL HIGH (ref 11.4–15.2)

## 2023-01-22 LAB — CBG MONITORING, ED: Glucose-Capillary: 131 mg/dL — ABNORMAL HIGH (ref 70–99)

## 2023-01-22 MED ORDER — FUROSEMIDE 10 MG/ML IJ SOLN
20.0000 mg/h | INTRAVENOUS | Status: DC
  Administered 2023-01-22 – 2023-01-26 (×9): 20 mg/h via INTRAVENOUS
  Filled 2023-01-22 (×11): qty 20

## 2023-01-22 MED ORDER — METOLAZONE 5 MG PO TABS
5.0000 mg | ORAL_TABLET | Freq: Once | ORAL | Status: AC
  Administered 2023-01-22: 5 mg via ORAL
  Filled 2023-01-22: qty 1

## 2023-01-22 MED ORDER — FUROSEMIDE 10 MG/ML IJ SOLN
120.0000 mg | Freq: Once | INTRAVENOUS | Status: AC
  Administered 2023-01-22: 120 mg via INTRAVENOUS
  Filled 2023-01-22: qty 2
  Filled 2023-01-22: qty 12

## 2023-01-22 MED ORDER — MORPHINE SULFATE 10 MG/5ML PO SOLN
5.0000 mg | ORAL | Status: DC | PRN
  Administered 2023-01-23: 5 mg via ORAL
  Filled 2023-01-22 (×2): qty 4

## 2023-01-22 MED ORDER — ONDANSETRON HCL 4 MG/2ML IJ SOLN
4.0000 mg | Freq: Four times a day (QID) | INTRAMUSCULAR | Status: DC | PRN
  Administered 2023-01-22 – 2023-01-28 (×5): 4 mg via INTRAVENOUS
  Filled 2023-01-22 (×5): qty 2

## 2023-01-22 NOTE — Plan of Care (Signed)
  Problem: Education: Goal: Ability to demonstrate management of disease process will improve Outcome: Progressing Goal: Ability to verbalize understanding of medication therapies will improve Outcome: Progressing Goal: Individualized Educational Video(s) Outcome: Progressing   Problem: Activity: Goal: Capacity to carry out activities will improve Outcome: Progressing   Problem: Cardiac: Goal: Ability to achieve and maintain adequate cardiopulmonary perfusion will improve Outcome: Progressing   Problem: Education: Goal: Ability to describe self-care measures that may prevent or decrease complications (Diabetes Survival Skills Education) will improve Outcome: Progressing Goal: Individualized Educational Video(s) Outcome: Progressing   Problem: Coping: Goal: Ability to adjust to condition or change in health will improve Outcome: Progressing   Problem: Fluid Volume: Goal: Ability to maintain a balanced intake and output will improve Outcome: Progressing   Problem: Health Behavior/Discharge Planning: Goal: Ability to identify and utilize available resources and services will improve Outcome: Progressing Goal: Ability to manage health-related needs will improve Outcome: Progressing   Problem: Metabolic: Goal: Ability to maintain appropriate glucose levels will improve Outcome: Progressing   Problem: Nutritional: Goal: Maintenance of adequate nutrition will improve Outcome: Progressing Goal: Progress toward achieving an optimal weight will improve Outcome: Progressing   Problem: Skin Integrity: Goal: Risk for impaired skin integrity will decrease Outcome: Progressing   Problem: Tissue Perfusion: Goal: Adequacy of tissue perfusion will improve Outcome: Progressing   Problem: Education: Goal: Knowledge of General Education information will improve Description: Including pain rating scale, medication(s)/side effects and non-pharmacologic comfort measures Outcome:  Progressing   Problem: Health Behavior/Discharge Planning: Goal: Ability to manage health-related needs will improve Outcome: Progressing   Problem: Clinical Measurements: Goal: Ability to maintain clinical measurements within normal limits will improve Outcome: Progressing Goal: Will remain free from infection Outcome: Progressing Goal: Diagnostic test results will improve Outcome: Progressing Goal: Respiratory complications will improve Outcome: Progressing Goal: Cardiovascular complication will be avoided Outcome: Progressing   Problem: Activity: Goal: Risk for activity intolerance will decrease Outcome: Progressing   Problem: Nutrition: Goal: Adequate nutrition will be maintained Outcome: Progressing   Problem: Coping: Goal: Level of anxiety will decrease Outcome: Progressing   Problem: Elimination: Goal: Will not experience complications related to bowel motility Outcome: Progressing Goal: Will not experience complications related to urinary retention Outcome: Progressing   Problem: Pain Management: Goal: General experience of comfort will improve Outcome: Progressing   Problem: Safety: Goal: Ability to remain free from injury will improve Outcome: Progressing   Problem: Skin Integrity: Goal: Risk for impaired skin integrity will decrease Outcome: Progressing

## 2023-01-22 NOTE — ED Notes (Signed)
Pt inadvertently removed IV to left forearm. Bleeding controlled. Will replace with new IV.

## 2023-01-22 NOTE — Consult Note (Signed)
Consultation Note Date: 01/22/2023   Patient Name: Daniel Crawford  DOB: 08-May-1955  MRN: [id number]  Age / Sex: 67 y.o., male  PCP: Daniel Reining, MD Referring Physician: Zannie Cove, MD  Reason for Consultation: Establishing goals of care  HPI/Patient Profile: 67 y.o. male  with past medical history of hypertension, diabetes, GERD, hypothyroidism, CHF with EF 55%, CKD3, OSA, obesity, NASH cirrhosis, anemia, depression, anxiety admitted on 01/21/2023 with edema and shortness of breath.   Patient has had several admissions for recurrent heart failure exacerbations as well as hepatic encephalopathy, most recently from 9/26-10/1 at [facility]. He presents for this admission with 40lb weight gain and ongoing decline due to several chronic comorbidities/recurrent acute on chronic diastolic CHF.  PMT has been consulted to assist with goals of care conversation.  Clinical Assessment and Goals of Care:  I have reviewed medical records including EPIC notes, labs and imaging, discussed with RN, assessed the patient and then met at the bedside to discuss diagnosis prognosis, GOC, EOL wishes, disposition and options.  I introduced Palliative Medicine as specialized medical care for people living with serious illness. It focuses on providing relief from the symptoms and stress of a serious illness. The goal is to improve quality of life for both the patient and the family.  We discussed a brief life review of the patient and then focused on their current illness.   I attempted to elicit values and goals of care important to the patient.    Medical History Review and Understanding:  Patient has a good understanding of the severity of his illness, noting his overall decline, recurrent CHF exacerbations, and worsening renal function.   Social History: Patient lives at home alone with support from friends/neighbors  and daily calls from his brother and sister, who live in [place]. He enjoys going out to meals with friend from time to time. He receives visits from pastors and listens to sermons from home but no longer attends in person.  Functional and Nutritional State: Patient reports independence with all ADLs, acknowledging that he is "weakening day by day." Albumin of 2.6 noted and provided education on relationship between protein and fluid status. He reports that his appetite varies, sometimes not feeling like he is hungry at all. He ambulates with a walker/cane and reports DOE. He no longer receives visits from [facility], denies any changes to the distances he can ambulate before requiring rest. He sleeps in his recliner due to feeling like he is suffocating when laying flat.  Palliative Symptoms: Edema, pain in bilateral lower legs, dyspnea on exertion Pain attributed to swelling, 10/10 at its worst and 4-5/10 at its best  Code Status: Patient continues to desire full code, stating he is not ready to "pull the plug" but would not want to prolong his suffering with ongoing life support if he does not improve with a trial of interventions such as mechanical ventilation.  Discussion: Patient recalls previous conversations with PMT during past hospitalizations.  He feels that his quality of life is about the same.  He wishes he did not have to deal with these monthly exacerbations of his symptoms.  He tells me he has had 22 visits to the hospital including ED visits and admissions since last year.  He also notes that he is the "worst off" of his friends since childhood.  He is trying to stay positive and tells me that his siblings have also noticed this big decline in his health.   Despite his health  decline, he is not ready to "give up" and wishes to continue fighting and pursuing medical interventions.  He has begun thinking about what limitations he would prefer in his care.  He does not feel that he would want  his life to be prolonged if there was no hope for improvement or successful treatment.  However, he is interested in a trial of aggressive life-prolonging interventions including life support, with the hope that it allows his family from [place] more time to come in to their goodbyes.  Patient still does not want to consider SNF and his goal is to remain independent home for as long as possible.  He is agreeable to this PA reaching out to his brother Daniel Crawford.  I attempted to reach by phone but was unable to contact him.  Left voicemail with PMT contact information and request for return call.   The difference between aggressive medical intervention and comfort care was considered in light of the patient's goals of care. Hospice and Palliative Care services outpatient were explained and offered.   Discussed the importance of continued conversation with family and the medical providers regarding overall plan of care and treatment options, ensuring decisions are within the context of the patient's values and GOCs.   Questions and concerns were addressed.  Hard Choices booklet left for review. The family was encouraged to call with questions or concerns.  PMT will continue to support holistically.   SUMMARY OF RECOMMENDATIONS   -Continue full code/full scope treatment -Patient understands his poor prognosis.  Goal is to remain independent in his home for long as possible, he does not ever want SNF -Attempted to contact patient's brother but was unable to reach today.  Voicemail was left with request for return call -Updated friend Daniel Crawford's contact information to facesheet at patient's request -Psychosocial and emotional support provided -PMT will continue to follow and support  Prognosis:  Poor  Discharge Planning: To Be Determined      Primary Diagnoses: Present on Admission:  Essential hypertension  OSA (obstructive sleep apnea)  Liver cirrhosis secondary to NASH (HCC)  Iron deficiency  anemia  GERD (gastroesophageal reflux disease)  Depression with anxiety  Acquired hypothyroidism  Morbid obesity (HCC)  Thrombocytopenia (HCC)  Volume overload  Acute on chronic diastolic (congestive) heart failure (HCC)   Physical Exam Vitals and nursing note reviewed.  Constitutional:      General: He is not in acute distress.    Appearance: He is ill-appearing.  Cardiovascular:     Rate and Rhythm: Normal rate.  Pulmonary:     Effort: Pulmonary effort is normal. No respiratory distress.  Musculoskeletal:     Right lower leg: Edema present.     Left lower leg: Edema present.  Neurological:     Mental Status: He is alert and oriented to person, place, and time.  Psychiatric:        Mood and Affect: Mood normal.        Behavior: Behavior normal.    Vital Signs: BP 130/61   Pulse 91   Temp 97.6 F (36.4 C) (Oral)   Resp 15   SpO2 100%  Pain Scale: 0-10   Pain Score: 8    SpO2: SpO2: 100 % O2 Device:SpO2: 100 % O2 Flow Rate: .    Palliative Assessment/Data: 60 to 70%    MDM: High    Ronnette Rump Jeni Salles, PA-C  Palliative Medicine Team Team phone # [phone]  Thank you for allowing the Palliative Medicine Team to  assist in the care of this patient. Please utilize secure chat with additional questions, if there is no response within 30 minutes please call the above phone number.  Palliative Medicine Team providers are available by phone from 7am to 7pm daily and can be reached through the team cell phone.  Should this patient require assistance outside of these hours, please call the patient's attending physician.

## 2023-01-22 NOTE — Consult Note (Signed)
[facility] Liaison Note  01/22/2023  Daniel Crawford 08-Oct-1955 [id number]  Location: [facility] Liaison met patient at bedside at [facility]. Patient was standing up in room when entered drinking juice and requesting a denture cup.  Nursing staff made aware of request.  Insurance: [employer] Medicare Advantage   Daniel Crawford is a 67 y.o. male who is a Primary Care Patient of [name], MD with [facility] [not an in network provider at this time] The patient was screened for less than 30 day readmission hospitalization with noted extreme risk score for unplanned readmission risk with 4IP/1 ED in 6 months.  The patient was assessed for potential Care Management service needs for post hospital transition for care coordination. Review of patient's electronic medical record reveals patient is from home alone he states and he drives himself.  Also, review of progress notes   Plan: [facility] Liaison will continue to follow progress and disposition to asess for post hospital community care coordination/management needs.  Referral request for community care coordination: Patient states he is followed by the HF team in community.   VBCI Care Management/Population Health does not replace or interfere with any arrangements made by the Inpatient Transition of Care team.   For questions contact:   Daniel Shanks, Daniel Crawford, Daniel Crawford, Daniel Crawford [place]  [facility], [facility] Liaison Direct Dial: [phone] or secure chat Website: [email or web address]

## 2023-01-22 NOTE — ED Notes (Signed)
ED TO INPATIENT HANDOFF REPORT  ED Nurse Name and Phone #: Percival Spanish [phone]  S Name/Age/Gender Daniel Crawford 67 y.o. male Room/Bed: 039C/039C  Code Status   Code Status: Full Code  Home/SNF/Other Home Patient oriented to: self, place, time, and situation Is this baseline? Yes   Triage Complete: Triage complete  Chief Complaint Acute on chronic diastolic (congestive) heart failure (HCC) [I50.33]  Triage Note Pt here sent from MD office for an 45 lb increase in weight over the last 2 weeks , pt's MD has increased his diuretic but it has not been working    Allergies No Known Allergies  Level of Care/Admitting Diagnosis ED Disposition     ED Disposition  Admit   Condition  --   Comment  Hospital Area: [facility] [[id number]]  Level of Care: Telemetry Cardiac [[id number]]  Deleeuw admit patient to [facility] or [facility] if equivalent level of care is available:: No  Covid Evaluation: Asymptomatic - no recent exposure (last 10 days) testing not required  Diagnosis: Acute on chronic diastolic (congestive) heart failure [facility]) [[id number]]  Admitting Physician: Synetta Fail [[id number]]  Attending Physician: Synetta Fail [[id number]]  Certification:: I certify this patient will need inpatient services for at least 2 midnights  Expected Medical Readiness: 01/25/2023          B Medical/Surgery History Past Medical History:  Diagnosis Date   Acute pancreatitis 10/02/2022   Anasarca 01/23/2022   Anemia    Anxiety    Asthma    CHF (congestive heart failure) (HCC)    CKD (chronic kidney disease)    D-dimer, elevated 01/22/2022   Decompensation of cirrhosis of liver (HCC) 05/21/2022   Depression    Diabetes mellitus without complication (HCC)    Dyspnea    Hepatic encephalopathy (HCC) 07/10/2022   Hypertension    Hypothyroidism    Liver cirrhosis secondary to NASH (HCC)    NASH (nonalcoholic steatohepatitis)    Other ascites 06/19/2019   Pancytopenia (HCC)  05/21/2022   Pre-diabetes    Spleen enlarged    Thrombocytopenia (HCC)    Past Surgical History:  Procedure Laterality Date   Bilateral hernia surgery     2006, 2007   CHOLECYSTECTOMY     2016    COLONOSCOPY WITH PROPOFOL N/A 06/28/2021   Procedure: COLONOSCOPY WITH PROPOFOL;  Surgeon: Malissa Hippo, MD;  [facility];  Service: Endoscopy;  Laterality: N/A;  1020   ESOPHAGEAL DILATION N/A 11/06/2019   Procedure: ESOPHAGEAL DILATION;  Surgeon: Dolores Frame, MD;  [facility];  Service: Gastroenterology;  Laterality: N/A;   ESOPHAGOGASTRODUODENOSCOPY (EGD) WITH PROPOFOL N/A 11/06/2019   Procedure: ESOPHAGOGASTRODUODENOSCOPY (EGD) WITH PROPOFOL;  Surgeon: Dolores Frame, MD;  [facility];  Service: Gastroenterology;  Laterality: N/A;  1045   IR RADIOLOGIST EVAL & MGMT  02/19/2022   LAPAROSCOPIC ASSISTED VENTRAL HERNIA REPAIR     Polyp removed     in January of 2018.    POLYPECTOMY  06/28/2021   Procedure: POLYPECTOMY INTESTINAL;  Surgeon: Malissa Hippo, MD;  [facility];  Service: Endoscopy;;   RIGHT HEART CATH N/A 01/08/2022   Procedure: RIGHT HEART CATH;  Surgeon: Corky Crafts, MD;  Location: [facility] INVASIVE CV LAB;  Service: Cardiovascular;  Laterality: N/A;     A IV Location/Drains/Wounds Patient Lines/Drains/Airways Status     Active Line/Drains/Airways     Name Placement date Placement time Site Days   Peripheral IV  01/22/23 20 G Right Hand 01/22/23  0625  Hand  less than 1            Intake/Output Last 24 hours  Intake/Output Summary (Last 24 hours) at 01/22/2023 1043 Last data filed at 01/22/2023 0126 Gross per 24 hour  Intake 50 ml  Output 1100 ml  Net -1050 ml    Labs/Imaging Results for orders placed or performed during the hospital encounter of 01/21/23 (from the past 48 hour(s))  Basic metabolic panel     Status: Abnormal   Collection Time: 01/21/23  1:59 PM  Result Value Ref  Range   Sodium 131 (L) 135 - 145 mmol/L   Potassium 4.3 3.5 - 5.1 mmol/L   Chloride 97 (L) 98 - 111 mmol/L   CO2 25 22 - 32 mmol/L   Glucose, Bld 143 (H) 70 - 99 mg/dL    Comment: Glucose reference range applies only to samples taken after fasting for at least 8 hours.   BUN 31 (H) 8 - 23 mg/dL   Creatinine, Ser 0.86 (H) 0.61 - 1.24 mg/dL   Calcium 8.6 (L) 8.9 - 10.3 mg/dL   GFR, Estimated 37 (L) >60 mL/min    Comment: (NOTE) Calculated using the CKD-EPI Creatinine Equation (2021)    Anion gap 9 5 - 15    Comment: Performed at [facility], [address], [place], [place] [address]  CBC     Status: Abnormal   Collection Time: 01/21/23  1:59 PM  Result Value Ref Range   WBC 5.0 4.0 - 10.5 K/uL   RBC 3.51 (L) 4.22 - 5.81 MIL/uL   Hemoglobin 9.7 (L) 13.0 - 17.0 g/dL   HCT 96.2 (L) 95.2 - 84.1 %   MCV 86.9 80.0 - 100.0 fL   MCH 27.6 26.0 - 34.0 pg   MCHC 31.8 30.0 - 36.0 g/dL   RDW 32.4 40.1 - 02.7 %   Platelets 68 (L) 150 - 400 K/uL    Comment: SPECIMEN CHECKED FOR CLOTS Immature Platelet Fraction Hooper be clinically indicated, consider ordering this additional test [id number] REPEATED TO VERIFY    nRBC 0.0 0.0 - 0.2 %    Comment: Performed at [facility], [address], [place], [place] [address]  Troponin I (High Sensitivity)     Status: None   Collection Time: 01/21/23  1:59 PM  Result Value Ref Range   Troponin I (High Sensitivity) 10 <18 ng/L    Comment: (NOTE) Elevated high sensitivity troponin I (hsTnI) values and significant  changes across serial measurements Billard suggest ACS but many other  chronic and acute conditions are known to elevate hsTnI results.  Refer to the "Links" section for chest pain algorithms and additional  guidance. Performed at [facility], [address], [place], [place] [address]   Brain natriuretic peptide     Status: None   Collection Time: 01/21/23  1:59 PM  Result Value Ref Range   B Natriuretic Peptide 31.7 0.0 -  100.0 pg/mL    Comment: Performed at [facility], [address] [place], [place] [address]  Troponin I (High Sensitivity)     Status: None   Collection Time: 01/21/23  5:05 PM  Result Value Ref Range   Troponin I (High Sensitivity) 12 <18 ng/L    Comment: (NOTE) Elevated high sensitivity troponin I (hsTnI) values and significant  changes across serial measurements Perales suggest ACS but many other  chronic and acute conditions  are known to elevate hsTnI results.  Refer to the "Links" section for chest pain algorithms and additional  guidance. Performed at [facility], [address] [place], [place] [address]   Hepatic function panel     Status: Abnormal   Collection Time: 01/21/23  5:05 PM  Result Value Ref Range   Total Protein 6.2 (L) 6.5 - 8.1 g/dL   Albumin 2.6 (L) 3.5 - 5.0 g/dL   AST 53 (H) 15 - 41 U/L   ALT 23 0 - 44 U/L   Alkaline Phosphatase 87 38 - 126 U/L   Total Bilirubin 3.3 (H) 0.3 - 1.2 mg/dL   Bilirubin, Direct 0.9 (H) 0.0 - 0.2 mg/dL   Indirect Bilirubin 2.4 (H) 0.3 - 0.9 mg/dL    Comment: Performed at [facility], [address] [place], [place] [address]  Magnesium     Status: None   Collection Time: 01/21/23  5:05 PM  Result Value Ref Range   Magnesium 2.4 1.7 - 2.4 mg/dL    Comment: Performed at [facility], [address], [place], [place] [address]  CBG monitoring, ED     Status: Abnormal   Collection Time: 01/21/23  9:23 PM  Result Value Ref Range   Glucose-Capillary 142 (H) 70 - 99 mg/dL    Comment: Glucose reference range applies only to samples taken after fasting for at least 8 hours.  Comprehensive metabolic panel     Status: Abnormal   Collection Time: 01/22/23  6:55 AM  Result Value Ref Range   Sodium 135 135 - 145 mmol/L   Potassium 4.4 3.5 - 5.1 mmol/L   Chloride 100 98 - 111 mmol/L   CO2 25 22 - 32 mmol/L   Glucose, Bld 130 (H) 70 - 99 mg/dL    Comment: Glucose reference range applies only to samples taken  after fasting for at least 8 hours.   BUN 35 (H) 8 - 23 mg/dL   Creatinine, Ser 2.72 (H) 0.61 - 1.24 mg/dL   Calcium 8.8 (L) 8.9 - 10.3 mg/dL   Total Protein 6.2 (L) 6.5 - 8.1 g/dL   Albumin 2.6 (L) 3.5 - 5.0 g/dL   AST 57 (H) 15 - 41 U/L   ALT 22 0 - 44 U/L   Alkaline Phosphatase 71 38 - 126 U/L   Total Bilirubin 3.7 (H) 0.3 - 1.2 mg/dL   GFR, Estimated 35 (L) >60 mL/min    Comment: (NOTE) Calculated using the CKD-EPI Creatinine Equation (2021)    Anion gap 10 5 - 15    Comment: Performed at [facility], [address], [place], [place] [address]  CBC     Status: Abnormal   Collection Time: 01/22/23  6:55 AM  Result Value Ref Range   WBC 4.0 4.0 - 10.5 K/uL   RBC 3.07 (L) 4.22 - 5.81 MIL/uL   Hemoglobin 8.5 (L) 13.0 - 17.0 g/dL   HCT 40.3 (L) 47.4 - 25.9 %   MCV 87.9 80.0 - 100.0 fL   MCH 27.7 26.0 - 34.0 pg   MCHC 31.5 30.0 - 36.0 g/dL   RDW 56.3 (H) 87.5 - 64.3 %   Platelets 63 (L) 150 - 400 K/uL    Comment: Immature Platelet Fraction Erekson be clinically indicated, consider ordering this additional test [id number] CONSISTENT WITH PREVIOUS RESULT REPEATED TO VERIFY    nRBC 0.0 0.0 - 0.2 %    Comment: Performed at [facility]  Lab, [address] [place], [place] [address]  CBG monitoring, ED     Status: Abnormal   Collection Time: 01/22/23  8:54 AM  Result Value Ref Range   Glucose-Capillary 131 (H) 70 - 99 mg/dL    Comment: Glucose reference range applies only to samples taken after fasting for at least 8 hours.   DG Chest 2 View  Result Date: 01/21/2023 CLINICAL DATA:  Chest pain. EXAM: CHEST - 2 VIEW COMPARISON:  12/20/2022. FINDINGS: Bilateral lung fields are clear. Bilateral costophrenic angles are clear. Normal cardio-mediastinal silhouette. No acute osseous abnormalities. The soft tissues are within normal limits. IMPRESSION: *No active cardiopulmonary disease. Electronically Signed   By: Jules Schick M.D.   On: 01/21/2023 16:39    Pending  Labs Unresulted Labs (From admission, onward)     Start     Ordered   01/23/23 0500  Comprehensive metabolic panel  Tomorrow morning,   R        01/22/23 1016   01/23/23 0500  CBC  Tomorrow morning,   R        01/22/23 1016   01/23/23 0500  Vitamin B12  (Anemia Panel (PNL))  Tomorrow morning,   R        01/22/23 1016   01/23/23 0500  Folate  (Anemia Panel (PNL))  Tomorrow morning,   R        01/22/23 1016   01/23/23 0500  Iron and TIBC  (Anemia Panel (PNL))  Tomorrow morning,   R        01/22/23 1016   01/23/23 0500  Ferritin  (Anemia Panel (PNL))  Tomorrow morning,   R        01/22/23 1016   01/23/23 0500  Reticulocytes  (Anemia Panel (PNL))  Tomorrow morning,   R        01/22/23 1016   01/22/23 1014  Ammonia  Once,   R        01/22/23 1013   01/21/23 1800  Protime-INR  Once,   AD        01/21/23 1800            Vitals/Pain Today's Vitals   01/22/23 0851 01/22/23 0900 01/22/23 0930 01/22/23 1000  BP:  128/63 (!) 118/57 (!) 119/58  Pulse:  89 88 84  Resp:  12 14 14   Temp: 97.6 F (36.4 C)     TempSrc: Oral     SpO2:  100% 100% 100%  PainSc:        Isolation Precautions No active isolations  Medications Medications  rifaximin (XIFAXAN) tablet 550 mg (550 mg Oral Given 01/22/23 1008)  spironolactone (ALDACTONE) tablet 100 mg (100 mg Oral Given 01/22/23 1013)  carvedilol (COREG) tablet 6.25 mg (6.25 mg Oral Given 01/22/23 0901)  sertraline (ZOLOFT) tablet 100 mg (100 mg Oral Given 01/22/23 1008)  hydrOXYzine (VISTARIL) capsule 50 mg (has no administration in time range)  traZODone (DESYREL) tablet 50 mg (50 mg Oral Given 01/21/23 2134)  levothyroxine (SYNTHROID) tablet 88 mcg (88 mcg Oral Given 01/22/23 1007)  dapagliflozin propanediol (FARXIGA) tablet 10 mg (10 mg Oral Given 01/22/23 0901)  lactulose (CHRONULAC) 10 GM/15ML solution 20 g (20 g Oral Given 01/22/23 1009)  pantoprazole (PROTONIX) EC tablet 40 mg (40 mg Oral Given 01/22/23 1008)  gabapentin  (NEURONTIN) capsule 100 mg (100 mg Oral Given 01/22/23 0022)  albuterol (PROVENTIL) (2.5 MG/3ML) 0.083% nebulizer solution 3 mL (has no administration in time range)  sodium chloride flush (NS) 0.9 %  injection 3 mL (3 mLs Intravenous Given 01/21/23 2135)  acetaminophen (TYLENOL) tablet 650 mg (650 mg Oral Given 01/22/23 0022)    Or  acetaminophen (TYLENOL) suppository 650 mg ( Rectal See Alternative 01/22/23 0022)  polyethylene glycol (MIRALAX / GLYCOLAX) packet 17 g (has no administration in time range)  insulin aspart (novoLOG) injection 0-15 Units (2 Units Subcutaneous Given 01/22/23 0903)  insulin aspart (novoLOG) injection 0-5 Units ( Subcutaneous Not Given 01/21/23 2126)  furosemide (LASIX) 120 mg in dextrose 5 % 50 mL IVPB (has no administration in time range)    Followed by  furosemide (LASIX) 200 mg in dextrose 5 % 100 mL (2 mg/mL) infusion (has no administration in time range)  metolazone (ZAROXOLYN) tablet 5 mg (5 mg Oral Given 01/22/23 0902)    Mobility walks     Focused Assessments Cardiac Assessment Handoff:  Cardiac Rhythm: Normal sinus rhythm Lab Results  Component Value Date   CKTOTAL 117 01/22/2022   Lab Results  Component Value Date   DDIMER 19.85 (H) 01/22/2022   Does the Patient currently have chest pain? No    R Recommendations: See Admitting Provider Note  Report given to:   Additional Notes:

## 2023-01-22 NOTE — Consult Note (Signed)
Advanced Heart Failure Team Consult Note   Primary Physician: [name], MD PCP-Cardiologist:  Nona Dell, MD HFMD: Dr Shirlee Latch   Reason for Consultation: A/C HFpEF  HPI:    Daniel Crawford is seen today for evaluation of A/C HFrEF at the request of Dr Jomarie Longs.   Daniel Crawford is a 67 year old with a history of cirrhosis (liver bx 2023 Stage IV cirrhosis), NASH, Hypothyroidism, HTN, DMII, CKD StageIII, COPD, and HFpEF.   Most recent Echo 06/2021 EF 55%, mild LVH, normal RV systolic function with mild RV enlargement.   Had RHC 12/2021 showed mildly elevated filling pressures with pulmonary venous hypertension.   He has had multiple admit for HF, anasaraca, and hepatic encephalopathy (8 admits over the last year). Followed in HF clinic and was seen in September with marked volume overload. Offered Furoscix however he preferred hospital admit. Diuresed with IV lasix and transitioned to torsemide 80 mg twice a day with weekly metolazone. Of note he requested d/c 10/1 despite being volume overloaded.  Discharge weight 370 pounds. He had post hospital visit 01/10/23 and was volume overloaded. Torsemide increased to 100 mg twice a day x 3 days followed by torsemide 60 mg twice a day.   He called the HF clinic with 40 pound weight gain over the last 2 weeks. He went to the ED for evaluation. He tells [place] he is taking  all medications but he denied taking torsemide to the pharmacy tech. Functionally limited. Uses an Art gallery manager in the grocery store. Says he has been following a low sodium diet. Says he drinks fluids to prevent constipation. Intermittently followed by [facility] in [place]. Lives alone.   Presented to ED with increased shortness of breath and increased lower extremity edema. CXTR no active disease. Pertinent admission labs included: BNP 32, HS Trop 10>12, albumin 2.6 , creatinine 1.96, sodium 132, CO2 25, and  hgb 9.7 .  Started on 120 mg IV lasix twice a day. Minimal urine  output .   Home Medications Prior to Admission medications   Medication Sig Start Date End Date Taking? Authorizing Provider  acetaminophen (TYLENOL) 500 MG tablet Take 1,000 mg by mouth every 6 (six) hours as needed for moderate pain.   Yes [provider]  albuterol (VENTOLIN HFA) 108 (90 Base) MCG/ACT inhaler Inhale 2 puffs into the lungs every 6 (six) hours as needed for wheezing or shortness of breath. 05/01/22  Yes Oretha Milch, MD  carvedilol (COREG) 6.25 MG tablet Take 6.25 mg by mouth 2 (two) times daily with a meal.   Yes [provider]  dapagliflozin propanediol (FARXIGA) 10 MG TABS tablet Take 1 tablet (10 mg total) by mouth daily before breakfast. 11/28/22  Yes Laurey Morale, MD  gabapentin (NEURONTIN) 100 MG capsule Take 100 mg by mouth daily as needed (pain). 02/20/21  Yes [provider]  hydrOXYzine (VISTARIL) 50 MG capsule Take 1 capsule (50 mg total) by mouth 3 (three) times daily as needed for anxiety or nausea. 01/25/22  Yes Emokpae, Courage, MD  lactose free nutrition (BOOST) LIQD Take 237 mLs by mouth every evening.   Yes [provider]  lactulose (CHRONULAC) 10 GM/15ML solution Take 30 mLs (20 g total) by mouth 2 (two) times daily. 10/26/22 01/24/23 Yes Vassie Loll, MD  levothyroxine (SYNTHROID) 88 MCG tablet Take 1 tablet (88 mcg total) by mouth daily before breakfast. 01/10/23  Yes Robbie Lis M, PA-C  metolazone (ZAROXOLYN) 2.5 MG tablet Take 1 tablet (  2.5 mg total) by mouth 2 (two) times a week. Patient taking differently: Take 2.5 mg by mouth 2 (two) times a week. Take Usually on Thursdays and Sundays per patient 01/10/23  Yes Simmons, Brittainy M, PA-C  ondansetron (ZOFRAN) 4 MG tablet Take 4 mg by mouth 2 (two) times daily as needed for nausea or vomiting. 10/28/22  Yes [provider]  pantoprazole (PROTONIX) 40 MG tablet Take 1 tablet (40 mg total) by mouth daily. 01/25/22  Yes Emokpae, Courage, MD  polyethylene  glycol (MIRALAX / GLYCOLAX) 17 g packet Take 17 g by mouth daily as needed for mild constipation.   Yes [provider]  potassium chloride SA (KLOR-CON M) 20 MEQ tablet Take 2 tablets (40 mEq total) by mouth 2 (two) times daily. 07/25/22  Yes McLean, Dalton S, MD  rifaximin (XIFAXAN) 550 MG TABS tablet Take 1 tablet (550 mg total) by mouth 2 (two) times daily. 01/25/22  Yes Emokpae, Courage, MD  sertraline (ZOLOFT) 100 MG tablet Take 100 mg by mouth daily. 09/24/22  Yes [provider]  spironolactone (ALDACTONE) 100 MG tablet Take 100 mg by mouth daily.   Yes [provider]  torsemide (DEMADEX) 20 MG tablet Take 20 mg by mouth 2 (two) times daily.   Yes [provider]  traZODone (DESYREL) 50 MG tablet Take 1 tablet (50 mg total) by mouth at bedtime. Patient taking differently: Take 100 mg by mouth at bedtime. 01/25/22  Yes Emokpae, Courage, MD  torsemide 40 MG TABS Take 80 mg by mouth 2 (two) times daily. Patient not taking: Reported on 01/21/2023 12/25/22 01/24/23  Grunz, Ryan B, MD    Past Medical History: Past Medical History:  Diagnosis Date   Acute pancreatitis 10/02/2022   Anasarca 01/23/2022   Anemia    Anxiety    Asthma    CHF (congestive heart failure) (HCC)    CKD (chronic kidney disease)    D-dimer, elevated 01/22/2022   Decompensation of cirrhosis of liver (HCC) 05/21/2022   Depression    Diabetes mellitus without complication (HCC)    Dyspnea    Hepatic encephalopathy (HCC) 07/10/2022   Hypertension    Hypothyroidism    Liver cirrhosis secondary to NASH (HCC)    NASH (nonalcoholic steatohepatitis)    Other ascites 06/19/2019   Pancytopenia (HCC) 05/21/2022   Pre-diabetes    Spleen enlarged    Thrombocytopenia (HCC)     Past Surgical History: Past Surgical History:  Procedure Laterality Date   Bilateral hernia surgery     2006, 2007   CHOLECYSTECTOMY     20 16    COLONOSCOPY WITH PROPOFOL N/A 06/28/2021   Procedure: COLONOSCOPY  WITH PROPOFOL;  Surgeon: Malissa Hippo, MD;  [facility];  Service: Endoscopy;  Laterality: N/A;  1020   ESOPHAGEAL DILATION N/A 11/06/2019   Procedure: ESOPHAGEAL DILATION;  Surgeon: Dolores Frame, MD;  Location: [facility];  Service: Gastroenterology;  Laterality: N/A;   ESOPHAGOGASTRODUODENOSCOPY (EGD) WITH PROPOFOL N/A 11/06/2019   Procedure: ESOPHAGOGASTRODUODENOSCOPY (EGD) WITH PROPOFOL;  Surgeon: Dolores Frame, MD;  [facility];  Service: Gastroenterology;  Laterality: N/A;  1045   IR RADIOLOGIST EVAL & MGMT  02/19/2022   LAPAROSCOPIC ASSISTED VENTRAL HERNIA REPAIR     Polyp removed     in January of 2018.    POLYPECTOMY  06/28/2021   Procedure: POLYPECTOMY INTESTINAL;  Surgeon: Malissa Hippo, MD;  [facility];  Service: Endoscopy;;   RIGHT HEART  CATH N/A 01/08/2022   Procedure: RIGHT HEART CATH;  Surgeon: Corky Crafts, MD;  Location: [facility] INVASIVE CV LAB;  Service: Cardiovascular;  Laterality: N/A;    Family History: Family History  Problem Relation Age of Onset   Liver cancer Mother    Heart disease Mother    Aneurysm Sister     Social History: Social History   Socioeconomic History   Marital status: Single    Spouse name: Not on file   Number of children: Not on file   Years of education: Not on file   Highest education level: Not on file  Occupational History   Not on file  Tobacco Use   Smoking status: Former    Current packs/day: 0.00    Average packs/day: 1 pack/day for 20.0 years (20.0 ttl pk-yrs)    Types: Cigarettes    Start date: 76    Quit date: 2017    Years since quitting: 7.8    Passive exposure: Current   Smokeless tobacco: Never  Vaping Use   Vaping status: Never Used  Substance and Sexual Activity   Alcohol use: No   Drug use: No   Sexual activity: Not on file  Other Topics Concern   Not on file  Social History Narrative   Not on file   Social Determinants of  Health   Financial Resource Strain: Low Risk  (03/28/2021)   [facility], [facility]   Overall Financial Resource Strain (CARDIA)    Difficulty of Paying Living Expenses: Not hard at all  Food Insecurity: No Food Insecurity (01/21/2023)   Hunger Vital Sign    Worried About Running Out of Food in the Last Year: Never true    Ran Out of Food in the Last Year: Never true  Transportation Needs: No Transportation Needs (01/21/2023)   PRAPARE - Administrator, Civil Service (Medical): No    Lack of Transportation (Non-Medical): No  Physical Activity: Not on file  Stress: Not on file  Social Connections: Not on file    Allergies:  No Known Allergies  Objective:    Vital Signs:   Temp:  [97.4 F (36.3 C)-99 F (37.2 C)] 98.9 F (37.2 C) (10/29 0419) Pulse Rate:  [83-99] 94 (10/29 0618) Resp:  [12-21] 20 (10/29 0618) BP: (97-138)/(44-77) 138/77 (10/29 0514) SpO2:  [96 %-100 %] 100 % (10/29 0618)    Weight change: There were no vitals filed for this visit.  Intake/Output:   Intake/Output Summary (Last 24 hours) at 01/22/2023 0716 Last data filed at 01/22/2023 0126 Gross per 24 hour  Intake 50 ml  Output 1100 ml  Net -1050 ml      Physical Exam    General:   No resp difficulty HEENT: normal Neck: supple. JVP too jaw . Carotids 2+ bilat; no bruits. No lymphadenopathy or thyromegaly appreciated. Cor: PMI nondisplaced. Regular rate & rhythm. No rubs, gallops or murmurs. Lungs: clear Abdomen: obese, soft, nontender, nondistended. No hepatosplenomegaly. No bruits or masses. Good bowel sounds. Extremities: no cyanosis, clubbing, rash, R and LLE 2+ edema Neuro: alert & orientedx3, cranial nerves grossly intact. moves all 4 extremities w/o difficulty. Affect pleasant   Telemetry   SR 90s   EKG   SR 98 bpm   Labs   Basic Metabolic Panel: Recent Labs  Lab 01/17/23 1318 01/21/23 1359 01/21/23 1705  NA 132* 131*  --   K 4.3 4.3   --   CL 95* 97*  --  CO2 25 25  --   GLUCOSE 138* 143*  --   BUN 38* 31*  --   CREATININE 2.21* 1.96*  --   CALCIUM 8.9 8.6*  --   MG  --   --  2.4    Liver Function Tests: Recent Labs  Lab 01/21/23 1705  AST 53*  ALT 23  ALKPHOS 87  BILITOT 3.3*  PROT 6.2*  ALBUMIN 2.6*   No results for input(s): "LIPASE", "AMYLASE" in the last 168 hours. No results for input(s): "AMMONIA" in the last 168 hours.  CBC: Recent Labs  Lab 01/21/23 1359  WBC 5.0  HGB 9.7*  HCT 30.5*  MCV 86.9  PLT 68*    Cardiac Enzymes: No results for input(s): "CKTOTAL", "CKMB", "CKMBINDEX", "TROPONINI" in the last 168 hours.  BNP: BNP (last 3 results) Recent Labs    12/20/22 1418 01/10/23 1101 01/21/23 1359  BNP 56.0 47.0 31.7    ProBNP (last 3 results) No results for input(s): "PROBNP" in the last 8760 hours.   CBG: Recent Labs  Lab 01/21/23 2123  GLUCAP 142*    Coagulation Studies: No results for input(s): "LABPROT", "INR" in the last 72 hours.   Imaging   DG Chest 2 View  Result Date: 01/21/2023 CLINICAL DATA:  Chest pain. EXAM: CHEST - 2 VIEW COMPARISON:  12/20/2022. FINDINGS: Bilateral lung fields are clear. Bilateral costophrenic angles are clear. Normal cardio-mediastinal silhouette. No acute osseous abnormalities. The soft tissues are within normal limits. IMPRESSION: *No active cardiopulmonary disease. Electronically Signed   By: Jules Schick M.D.   On: 01/21/2023 16:39     Medications:     Current Medications:  carvedilol  6.25 mg Oral BID WC   dapagliflozin propanediol  10 mg Oral QAC breakfast   insulin aspart  0-15 Units Subcutaneous TID WC   insulin aspart  0-5 Units Subcutaneous QHS   lactulose  20 g Oral BID   levothyroxine  88 mcg Oral QAC breakfast   pantoprazole  40 mg Oral Daily   rifaximin  550 mg Oral BID   sertraline  100 mg Oral Daily   sodium chloride flush  3 mL Intravenous Q12H   spironolactone  100 mg Oral Daily   traZODone  50 mg Oral  QHS    Infusions:  furosemide Stopped (01/21/23 1925)      Patient Profile   Daniel Crawford is a 67 year old with a history of cirrhosis (liver bx 2023 Stage IV cirrhosis), NASH, Hypothyroidism, HTN, DMII, CKD StageIII, COPD, and HFpEF.   Admitted with A/C HFpEF.   Assessment/Plan   1. A/C HFpEF Most recent Echo 06/2022 LVEF 55% with norlmal RV function. Repeat ECHO.  Multiple admits over the last year with HF exacerbation. Failed escalating oral diuretic regimen.   - On exam he has marked volume overloaded. Given 120 mg IV now followed by lasix drip at 20 mg per hour. Also give dose of metolazone.  - Continue spironolacton 100 mg daily  - Add Unna Boots  - Follow renal function closely.   2. Cirrhosis, NASH Followed by GI in [place]  IV cirrhosis, had liver biopsy 2023.  -On lactulose and rifaximin.  - Mild asterixis.  - Check ammonia  3. CKD Stage IIIb -Creatinine baseline over the last few much continues to worsen.  Previously around 1.7-1.8  On admit 1.96--> today 2.1 Denies recent NSAIDs  4. DMII   5. Anemia  Hgb 8.5. No obvious source.    5. GOC  We discussed Palliative Care for GOC given multiple HF admissions. He was agreeable.    Length of Stay: 1  Nikea Settle, NP  01/22/2023, 7:16 AM  Advanced Heart Failure Team Pager [phone] (M-F; 7a - 5p)  Please contact [facility] for night-coverage after hours (4p -7a ) and weekends on [email or web address]

## 2023-01-22 NOTE — Progress Notes (Signed)
PROGRESS NOTE    Daniel Crawford  HKV:[id number] DOB: [birth year] DOA: 01/21/2023 PCP: [name], MD  62/M with history of cirrhosis, fatty liver disease, morbid obesity, diastolic CHF, CKD 3B/4, COPD, type 2 diabetes mellitus, hypothyroidism, hypertension, frequent hospitalizations with fluid overload, discharged from [facility] 4 weeks ago after admission for same. -Back in the ED with 30 to 40 pound weight gain, increasing swelling, dyspnea on exertion and decreased functional status. -In the ED, vital signs stable, sodium 131, creatinine 1.9, hemoglobin 9.7, platelets 68, troponin normal, chest x-ray without acute findings   Subjective: -Upset about frequent admissions, reports compliance with medications and allegedly trying to do the best he can with diet  Assessment and Plan:  Acute on chronic diastolic CHF -Last echo 4/24 with EF 55%, normal RV -Numerous hospitalizations with volume overload this year, almost every month -Heart failure team consulting, now on Lasix drip, metolazone -Continued on Aldactone -Has CKD 3B/4, monitor kidney function closely -Palliative consulted for goals of care on account of frequent admissions    NASH cirrhosis -IV Lasix as above - Continue home spironolactone, lactulose, rifaximin -Check ammonia level  CKD 3B/4 -Baseline creatinine 1.7-2 range -Now 2.0, monitor with diuretics, potassium on Aldactone   Hypertension - Lasix as above - Continue home spironolactone, carvedilol   Diabetes - SSI   GERD - Continue home PPI   Hypothyroidism - Continue home Synthroid   OSA - Does not use CPAP   Obesity - Noted   Anemia Thrombocytopenia -Some worsening of anemia, check anemia panel with tomorrow's labs   Depression Anxiety - Continue home sertraline, hydroxyzine, trazodone  DVT prophylaxis: SCDs Code Status: Full code Family Communication: None present Disposition Plan: Home pending improvement in volume  status  Consultants:    Procedures:   Antimicrobials:    Objective: Vitals:   01/22/23 0617 01/22/23 0618 01/22/23 0851 01/22/23 0851  BP:   130/61   Pulse: 84 94 91   Resp: 13 20 15    Temp:    97.6 F (36.4 C)  TempSrc:    Oral  SpO2: 96% 100% 100%     Intake/Output Summary (Last 24 hours) at 01/22/2023 1001 Last data filed at 01/22/2023 0126 Gross per 24 hour  Intake 50 ml  Output 1100 ml  Net -1050 ml   There were no vitals filed for this visit.  Examination:  General exam: Obese chronically ill male laying in bed, AAOx3 HEENT: + JVD CVS: S1-S2,, regular rhythm Lungs: Clear bilaterally Abdomen: Soft, obese, nontender, bowel sounds present Extremities: 2+ edema Skin: No rashes Psychiatry:  Mood & affect appropriate.     Data Reviewed:   CBC: Recent Labs  Lab 01/21/23 1359 01/22/23 0655  WBC 5.0 4.0  HGB 9.7* 8.5*  HCT 30.5* 27.0*  MCV 86.9 87.9  PLT 68* 63*   Basic Metabolic Panel: Recent Labs  Lab 01/17/23 1318 01/21/23 1359 01/21/23 1705 01/22/23 0655  NA 132* 131*  --  135  K 4.3 4.3  --  4.4  CL 95* 97*  --  100  CO2 25 25  --  25  GLUCOSE 138* 143*  --  130*  BUN 38* 31*  --  35*  CREATININE 2.21* 1.96*  --  2.06*  CALCIUM 8.9 8.6*  --  8.8*  MG  --   --  2.4  --    GFR: Estimated Creatinine Clearance: 59.2 mL/min (A) (by C-G formula based on SCr of 2.06 mg/dL (H)). Liver Function Tests: Recent  Labs  Lab 01/21/23 1705 01/22/23 0655  AST 53* 57*  ALT 23 22  ALKPHOS 87 71  BILITOT 3.3* 3.7*  PROT 6.2* 6.2*  ALBUMIN 2.6* 2.6*   No results for input(s): "LIPASE", "AMYLASE" in the last 168 hours. No results for input(s): "AMMONIA" in the last 168 hours. Coagulation Profile: No results for input(s): "INR", "PROTIME" in the last 168 hours. Cardiac Enzymes: No results for input(s): "CKTOTAL", "CKMB", "CKMBINDEX", "TROPONINI" in the last 168 hours. BNP (last 3 results) No results for input(s): "PROBNP" in the last 8760  hours. HbA1C: No results for input(s): "HGBA1C" in the last 72 hours. CBG: Recent Labs  Lab 01/21/23 2123 01/22/23 0854  GLUCAP 142* 131*   Lipid Profile: No results for input(s): "CHOL", "HDL", "LDLCALC", "TRIG", "CHOLHDL", "LDLDIRECT" in the last 72 hours. Thyroid Function Tests: No results for input(s): "TSH", "T4TOTAL", "FREET4", "T3FREE", "THYROIDAB" in the last 72 hours. Anemia Panel: No results for input(s): "VITAMINB12", "FOLATE", "FERRITIN", "TIBC", "IRON", "RETICCTPCT" in the last 72 hours. Urine analysis:    Component Value Date/Time   COLORURINE YELLOW 10/20/2022 1045   APPEARANCEUR CLEAR 10/20/2022 1045   LABSPEC 1.008 10/20/2022 1045   PHURINE 7.0 10/20/2022 1045   GLUCOSEU NEGATIVE 10/20/2022 1045   HGBUR NEGATIVE 10/20/2022 1045   BILIRUBINUR NEGATIVE 10/20/2022 1045   KETONESUR NEGATIVE 10/20/2022 1045   PROTEINUR NEGATIVE 10/20/2022 1045   NITRITE NEGATIVE 10/20/2022 1045   LEUKOCYTESUR NEGATIVE 10/20/2022 1045   Sepsis Labs: @LABRCNTIP (procalcitonin:4,lacticidven:4)  )No results found for this or any previous visit (from the past 240 hour(s)).   Radiology Studies: DG Chest 2 View  Result Date: 01/21/2023 CLINICAL DATA:  Chest pain. EXAM: CHEST - 2 VIEW COMPARISON:  12/20/2022. FINDINGS: Bilateral lung fields are clear. Bilateral costophrenic angles are clear. Normal cardio-mediastinal silhouette. No acute osseous abnormalities. The soft tissues are within normal limits. IMPRESSION: *No active cardiopulmonary disease. Electronically Signed   By: Jules Schick M.D.   On: 01/21/2023 16:39     Scheduled Meds:  carvedilol  6.25 mg Oral BID WC   dapagliflozin propanediol  10 mg Oral QAC breakfast   insulin aspart  0-15 Units Subcutaneous TID WC   insulin aspart  0-5 Units Subcutaneous QHS   lactulose  20 g Oral BID   levothyroxine  88 mcg Oral QAC breakfast   pantoprazole  40 mg Oral Daily   rifaximin  550 mg Oral BID   sertraline  100 mg Oral Daily    sodium chloride flush  3 mL Intravenous Q12H   spironolactone  100 mg Oral Daily   traZODone  50 mg Oral QHS   Continuous Infusions:  furosemide     Followed by   furosemide (LASIX) 200 mg in dextrose 5 % 100 mL (2 mg/mL) infusion       LOS: 1 day    Time spent:    Zannie Cove, MD [facility]   01/22/2023, 10:01 AM

## 2023-01-22 NOTE — ED Notes (Signed)
Pt constantly asked for water and ice chips throughout shift. Reminded pt he is on a 1500 ml fluid restriction. Pt is in disagreement with current POC and reports if the provider does not lift the restriction he is going home. Informed him he could speak with the provider during morning rounds.

## 2023-01-22 NOTE — Progress Notes (Signed)
Orthopedic Tech Progress Note Patient Details:  Daniel Crawford December 28, 1955 [id number]  Ortho Devices Type of Ortho Device: Radio broadcast assistant Ortho Device/Splint Location: BLE Ortho Device/Splint Interventions: Ordered, Application, Adjustment   Post Interventions Patient Tolerated: Well Instructions Provided: Care of device, Adjustment of device  Sherilyn Banker 01/22/2023, 4:40 PM

## 2023-01-22 NOTE — TOC Initial Note (Addendum)
Transition of Care [facility]) - Initial/Assessment Note    Patient Details  Name: Daniel Crawford MRN: [id number] Date of Birth: [birth year]  Transition of [facility]) CM/SW Contact:    Nicanor Bake Phone Number: [phone] 01/22/2023, 11:24 AM  Clinical Narrative:    HF CSW and HF CSW Supervisor, Annice Pih met with pt at bedside. Pt stated that he lives alone. Pt stated that he drives and does most of his daily living activities for himself. Pt stated that he has a history of home health services, and his nurse aide Lenis Dickinson comes a few days out of the week to assist him.  CSW sent the pts information to the financial counselors to be screened for Medicaid. Per the financial counselors team pt is in deductible status because he is over income. Financial counselors Occupational hygienist.    TOC will continue following.      TOC will continue following.                      Patient Goals and CMS Choice            Expected Discharge Plan and Services                                              Prior Living Arrangements/Services                       Activities of Daily Living   ADL Screening (condition at time of admission) Independently performs ADLs?: Yes (appropriate for developmental age) Is the patient deaf or have difficulty hearing?: No Does the patient have difficulty seeing, even when wearing glasses/contacts?: Yes Does the patient have difficulty concentrating, remembering, or making decisions?: Yes  Permission Sought/Granted                  Emotional Assessment              Admission diagnosis:  Acute on chronic diastolic (congestive) heart failure (HCC) [I50.33] Patient Active Problem List   Diagnosis Date Noted   COPD (chronic obstructive pulmonary disease) (HCC) 01/21/2023   Acute on chronic diastolic (congestive) heart failure (HCC) 01/21/2023   Cellulitis and abscess of left leg 10/23/2022   Chronic  hyponatremia 10/21/2022   T2DM (type 2 diabetes mellitus) (HCC) 07/06/2022   CKD stage 3a, GFR 45-59 ml/min (HCC) 07/06/2022   Umbilical hernia 07/05/2022   Morbid obesity (HCC) 05/25/2022   Liver cirrhosis secondary to NASH (HCC) 03/13/2022   Iron deficiency anemia 02/28/2022   GERD (gastroesophageal reflux disease) 02/28/2022   Generalized weakness 01/23/2022   Essential hypertension 01/23/2022   Volume overload 01/23/2022   Elevated TSH 01/22/2022   Protein calorie malnutrition (HCC) 01/22/2022   Chronic diastolic heart failure (HCC)    Hypogonadism, male 12/26/2021   History of colonic polyps 07/27/2021   Auditory hallucinations 07/27/2021   Cellulitis of left leg 03/26/2021   OSA (obstructive sleep apnea) 01/20/2020   History of ascites 10/22/2019   Acquired hypothyroidism 10/16/2016   Depression with anxiety 10/16/2016   Thrombocytopenia (HCC) 10/16/2016   Spleen enlarged 10/16/2016   Class 3 severe obesity in adult [facility]) 02/28/2016   PCP:  [name], MD Pharmacy:   [facility] [phone] - [place], [place] - [address] [place] [address] Phone: [phone] Fax: [phone]     Social Determinants of Health (SDOH) Social History: SDOH Screenings   Food Insecurity: No Food Insecurity (01/21/2023)  Housing: Low Risk  (01/21/2023)  Transportation Needs: No Transportation Needs (01/21/2023)  Utilities: Not At Risk (01/21/2023)  Depression (PHQ2-9): Medium Risk (10/27/2019)  Financial Resource Strain: Low Risk  (03/28/2021)   [facility] from [facility]  Tobacco Use: Medium Risk (01/21/2023)   SDOH Interventions:     Readmission Risk Interventions    12/21/2022    9:17 AM 10/20/2022    2:15 PM 10/04/2022    9:36 AM  Readmission Risk Prevention Plan  Transportation Screening Complete Complete Complete  Medication Review Oceanographer) Complete Complete Complete  PCP or Specialist  appointment within 3-5 days of discharge  Not Complete   HRI or Home Care Consult Complete Complete Complete  SW Recovery Care/Counseling Consult Complete Complete Complete  Palliative Care Screening Not Applicable Not Applicable Not Applicable  Skilled Nursing Facility Not Applicable Not Applicable Not Applicable

## 2023-01-23 ENCOUNTER — Inpatient Hospital Stay (HOSPITAL_COMMUNITY): Payer: Medicare HMO

## 2023-01-23 DIAGNOSIS — R609 Edema, unspecified: Secondary | ICD-10-CM

## 2023-01-23 DIAGNOSIS — I5033 Acute on chronic diastolic (congestive) heart failure: Secondary | ICD-10-CM | POA: Diagnosis not present

## 2023-01-23 DIAGNOSIS — K7460 Unspecified cirrhosis of liver: Secondary | ICD-10-CM | POA: Diagnosis not present

## 2023-01-23 DIAGNOSIS — Z789 Other specified health status: Secondary | ICD-10-CM

## 2023-01-23 DIAGNOSIS — K5900 Constipation, unspecified: Secondary | ICD-10-CM

## 2023-01-23 DIAGNOSIS — I509 Heart failure, unspecified: Secondary | ICD-10-CM | POA: Diagnosis not present

## 2023-01-23 DIAGNOSIS — Z711 Person with feared health complaint in whom no diagnosis is made: Secondary | ICD-10-CM

## 2023-01-23 LAB — RETICULOCYTES
Immature Retic Fract: 15.6 % (ref 2.3–15.9)
RBC.: 3.01 MIL/uL — ABNORMAL LOW (ref 4.22–5.81)
Retic Count, Absolute: 60.8 10*3/uL (ref 19.0–186.0)
Retic Ct Pct: 2 % (ref 0.4–3.1)

## 2023-01-23 LAB — ECHOCARDIOGRAM COMPLETE
Area-P 1/2: 3.03 cm2
Height: 74 in
S' Lateral: 4.3 cm
Weight: 6148.19 [oz_av]

## 2023-01-23 LAB — GLUCOSE, CAPILLARY
Glucose-Capillary: 151 mg/dL — ABNORMAL HIGH (ref 70–99)
Glucose-Capillary: 161 mg/dL — ABNORMAL HIGH (ref 70–99)
Glucose-Capillary: 119 mg/dL — ABNORMAL HIGH (ref 70–99)
Glucose-Capillary: 129 mg/dL — ABNORMAL HIGH (ref 70–99)

## 2023-01-23 LAB — COMPREHENSIVE METABOLIC PANEL
ALT: 25 U/L (ref 0–44)
AST: 54 U/L — ABNORMAL HIGH (ref 15–41)
Albumin: 2.6 g/dL — ABNORMAL LOW (ref 3.5–5.0)
Alkaline Phosphatase: 67 U/L (ref 38–126)
Anion gap: 15 (ref 5–15)
BUN: 37 mg/dL — ABNORMAL HIGH (ref 8–23)
CO2: 25 mmol/L (ref 22–32)
Calcium: 8.9 mg/dL (ref 8.9–10.3)
Chloride: 94 mmol/L — ABNORMAL LOW (ref 98–111)
Creatinine, Ser: 2.35 mg/dL — ABNORMAL HIGH (ref 0.61–1.24)
GFR, Estimated: 30 mL/min — ABNORMAL LOW (ref >60)
Glucose, Bld: 161 mg/dL — ABNORMAL HIGH (ref 70–99)
Potassium: 3.6 mmol/L (ref 3.5–5.1)
Sodium: 134 mmol/L — ABNORMAL LOW (ref 135–145)
Total Bilirubin: 4.2 mg/dL — ABNORMAL HIGH (ref 0.3–1.2)
Total Protein: 6.3 g/dL — ABNORMAL LOW (ref 6.5–8.1)

## 2023-01-23 LAB — CBC
HCT: 26.2 % — ABNORMAL LOW (ref 39.0–52.0)
Hemoglobin: 8.4 g/dL — ABNORMAL LOW (ref 13.0–17.0)
MCH: 27.3 pg (ref 26.0–34.0)
MCHC: 32.1 g/dL (ref 30.0–36.0)
MCV: 85.1 fL (ref 80.0–100.0)
Platelets: 62 10*3/uL — ABNORMAL LOW (ref 150–400)
RBC: 3.08 MIL/uL — ABNORMAL LOW (ref 4.22–5.81)
RDW: 15.7 % — ABNORMAL HIGH (ref 11.5–15.5)
WBC: 4.2 10*3/uL (ref 4.0–10.5)
nRBC: 0 % (ref 0.0–0.2)

## 2023-01-23 LAB — IRON AND TIBC
Iron: 56 ug/dL (ref 45–182)
Saturation Ratios: 16 % — ABNORMAL LOW (ref 17.9–39.5)
TIBC: 361 ug/dL (ref 250–450)
UIBC: 305 ug/dL

## 2023-01-23 LAB — VITAMIN B12: Vitamin B-12: 1229 pg/mL — ABNORMAL HIGH (ref 180–914)

## 2023-01-23 LAB — FERRITIN: Ferritin: 46 ng/mL (ref 24–336)

## 2023-01-23 MED ORDER — SODIUM CHLORIDE 0.9 % IV SOLN
100.0000 mg | Freq: Once | INTRAVENOUS | Status: AC
  Administered 2023-01-23: 100 mg via INTRAVENOUS
  Filled 2023-01-23: qty 5

## 2023-01-23 MED ORDER — SENNOSIDES-DOCUSATE SODIUM 8.6-50 MG PO TABS
1.0000 | ORAL_TABLET | Freq: Two times a day (BID) | ORAL | Status: DC
  Administered 2023-01-24 – 2023-01-30 (×11): 1 via ORAL
  Filled 2023-01-23 (×13): qty 1

## 2023-01-23 MED ORDER — POTASSIUM CHLORIDE CRYS ER 20 MEQ PO TBCR
40.0000 meq | EXTENDED_RELEASE_TABLET | Freq: Once | ORAL | Status: AC
  Administered 2023-01-23: 40 meq via ORAL
  Filled 2023-01-23: qty 2

## 2023-01-23 MED ORDER — METOLAZONE 5 MG PO TABS
5.0000 mg | ORAL_TABLET | Freq: Once | ORAL | Status: AC
  Administered 2023-01-23: 5 mg via ORAL
  Filled 2023-01-23: qty 1

## 2023-01-23 MED ORDER — PERFLUTREN LIPID MICROSPHERE
1.0000 mL | INTRAVENOUS | Status: AC | PRN
  Administered 2023-01-23: 3 mL via INTRAVENOUS

## 2023-01-23 MED ORDER — LACTULOSE 10 GM/15ML PO SOLN
30.0000 g | Freq: Three times a day (TID) | ORAL | Status: DC
Start: 2023-01-23 — End: 2023-01-27
  Administered 2023-01-23 – 2023-01-27 (×2): 30 g via ORAL
  Filled 2023-01-23 (×6): qty 45

## 2023-01-23 MED ORDER — ACETAZOLAMIDE 250 MG PO TABS
250.0000 mg | ORAL_TABLET | Freq: Once | ORAL | Status: AC
  Administered 2023-01-23: 250 mg via ORAL
  Filled 2023-01-23: qty 1

## 2023-01-23 MED ORDER — POLYETHYLENE GLYCOL 3350 17 G PO PACK
17.0000 g | PACK | Freq: Every day | ORAL | Status: DC
  Filled 2023-01-23 (×2): qty 1

## 2023-01-23 NOTE — Progress Notes (Signed)
PROGRESS NOTE    Daniel Crawford  ZOX:[id number] DOB: [id number]-[id number] DOA: 01/21/2023 PCP: [name], MD  62/M with history of cirrhosis, fatty liver disease, morbid obesity, diastolic CHF, CKD 3B/4, COPD, type 2 diabetes mellitus, hypothyroidism, hypertension, frequent hospitalizations with fluid overload, discharged from [facility] 4 weeks ago after admission for same. -Back in the ED with 30 to 40 pound weight gain, increasing swelling, dyspnea on exertion and decreased functional status. -In the ED, vital signs stable, sodium 131, creatinine 1.9, hemoglobin 9.7, platelets 68, troponin normal, chest x-ray without acute findings   Subjective: -Upset about frequent admissions, reports compliance with medications and allegedly trying to do the best he can with diet  Assessment and Plan:  Acute on chronic diastolic CHF -Last echo 4/24 with EF 55%, normal RV -Numerous hospitalizations with volume overload this year, almost every month -Heart failure team consulting, now on Lasix drip, metolazone, he is 4.9 L negative -Continue Aldactone, Unna boots -Has CKD 3B/4, monitor kidney function closely -Palliative consulted for goals of care on account of frequent admissions, he wishes for full code and full scope of Rx   Daniel cirrhosis -IV Lasix as above - Continue home spironolactone, lactulose, rifaximin -Ammonia level mildly elevated, increase lactulose  CKD 3B/4 -Baseline creatinine 1.7-2 range -Now 2.3, monitor with diuretics, potassium on Aldactone  Anemia Thrombocytopenia -At baseline likely from chronic liver disease and CKD -Some worsening of anemia, could be hemodilution related -Anemia panel with iron deficiency as well, add IV iron   Hypertension - Lasix as above - Continue home spironolactone, carvedilol   Diabetes - SSI, CBGs are stable   GERD - Continue home PPI   Hypothyroidism - Continue home Synthroid   OSA -Reportedly had a negative sleep study  recently, declines this diagnosis   Obesity - Noted, disc last importance of diet and lifestyle modification   Depression Anxiety - Continue home sertraline, hydroxyzine, trazodone  DVT prophylaxis: SCDs Code Status: Full code Family Communication: None present Disposition Plan: Home pending improvement in volume status  Consultants:    Procedures:   Antimicrobials:    Objective: Vitals:   01/22/23 2310 01/23/23 0307 01/23/23 0359 01/23/23 0810  BP: (!) 107/52 (!) 122/55  (!) 117/53  Pulse: 82 83  78  Resp: 20 18  13   Temp: 98.9 F (37.2 C) 98.4 F (36.9 C)  98.3 F (36.8 C)  TempSrc: Oral Oral  Oral  SpO2: 95% 100%  95%  Weight:   (!) 174.3 kg   Height:        Intake/Output Summary (Last 24 hours) at 01/23/2023 1037 Last data filed at 01/23/2023 0900 Gross per 24 hour  Intake 1123.41 ml  Output 5175 ml  Net -4051.59 ml   Filed Weights   01/22/23 1427 01/23/23 0359  Weight: (!) 176.6 kg (!) 174.3 kg    Examination:  General exam: Obese chronically ill male laying in bed, AAOx3 HEENT: + JVD CVS: S1-S2,, regular rhythm Lungs: Clear bilaterally Abdomen: Soft, obese, nontender, bowel sounds present Extremities: 2+ edema Skin: No rashes Psychiatry:  Mood & affect appropriate.     Data Reviewed:   CBC: Recent Labs  Lab 01/21/23 1359 01/22/23 0655 01/23/23 0354  WBC 5.0 4.0 4.2  HGB 9.7* 8.5* 8.4*  HCT 30.5* 27.0* 26.2*  MCV 86.9 87.9 85.1  PLT 68* 63* 62*   Basic Metabolic Panel: Recent Labs  Lab 01/17/23 1318 01/21/23 1359 01/21/23 1705 01/22/23 0655 01/22/23 1724 01/23/23 0354  NA 132* 131*  --  135 135 134*  K 4.3 4.3  --  4.4 4.2 3.6  CL 95* 97*  --  100 96* 94*  CO2 25 25  --  25 28 25   GLUCOSE 138* 143*  --  130* 132* 161*  BUN 38* 31*  --  35* 35* 37*  CREATININE 2.21* 1.96*  --  2.06* 2.27* 2.35*  CALCIUM 8.9 8.6*  --  8.8* 9.1 8.9  MG  --   --  2.4  --   --   --   PHOS  --   --   --   --  4.1  --    GFR: Estimated  Creatinine Clearance: 53.5 mL/min (A) (by C-G formula based on SCr of 2.35 mg/dL (H)). Liver Function Tests: Recent Labs  Lab 01/21/23 1705 01/22/23 0655 01/22/23 1724 01/23/23 0354  AST 53* 57*  --  54*  ALT 23 22  --  25  ALKPHOS 87 71  --  67  BILITOT 3.3* 3.7*  --  4.2*  PROT 6.2* 6.2*  --  6.3*  ALBUMIN 2.6* 2.6* 2.8* 2.6*   No results for input(s): "LIPASE", "AMYLASE" in the last 168 hours. Recent Labs  Lab 01/22/23 1059  AMMONIA 60*   Coagulation Profile: Recent Labs  Lab 01/22/23 1724  INR 1.5*   Cardiac Enzymes: No results for input(s): "CKTOTAL", "CKMB", "CKMBINDEX", "TROPONINI" in the last 168 hours. BNP (last 3 results) No results for input(s): "PROBNP" in the last 8760 hours. HbA1C: No results for input(s): "HGBA1C" in the last 72 hours. CBG: Recent Labs  Lab 01/22/23 0854 01/22/23 1349 01/22/23 1545 01/22/23 2114 01/23/23 0858  GLUCAP 131* 161* 124* 170* 151*   Lipid Profile: No results for input(s): "CHOL", "HDL", "LDLCALC", "TRIG", "CHOLHDL", "LDLDIRECT" in the last 72 hours. Thyroid Function Tests: No results for input(s): "TSH", "T4TOTAL", "FREET4", "T3FREE", "THYROIDAB" in the last 72 hours. Anemia Panel: Recent Labs    01/23/23 0354  VITAMINB12 1,229*  FERRITIN 46  TIBC 361  IRON 56  RETICCTPCT 2.0   Urine analysis:    Component Value Date/Time   COLORURINE YELLOW 10/20/2022 1045   APPEARANCEUR CLEAR 10/20/2022 1045   LABSPEC 1.008 10/20/2022 1045   PHURINE 7.0 10/20/2022 1045   GLUCOSEU NEGATIVE 10/20/2022 1045   HGBUR NEGATIVE 10/20/2022 1045   BILIRUBINUR NEGATIVE 10/20/2022 1045   KETONESUR NEGATIVE 10/20/2022 1045   PROTEINUR NEGATIVE 10/20/2022 1045   NITRITE NEGATIVE 10/20/2022 1045   LEUKOCYTESUR NEGATIVE 10/20/2022 1045   Sepsis Labs: @LABRCNTIP (procalcitonin:4,lacticidven:4)  )No results found for this or any previous visit (from the past 240 hour(s)).   Radiology Studies: DG Chest 2 View  Result Date:  01/21/2023 CLINICAL DATA:  Chest pain. EXAM: CHEST - 2 VIEW COMPARISON:  12/20/2022. FINDINGS: Bilateral lung fields are clear. Bilateral costophrenic angles are clear. Normal cardio-mediastinal silhouette. No acute osseous abnormalities. The soft tissues are within normal limits. IMPRESSION: *No active cardiopulmonary disease. Electronically Signed   By: Jules Schick M.D.   On: 01/21/2023 16:39     Scheduled Meds:  carvedilol  6.25 mg Oral BID WC   dapagliflozin propanediol  10 mg Oral QAC breakfast   insulin aspart  0-15 Units Subcutaneous TID WC   insulin aspart  0-5 Units Subcutaneous QHS   lactulose  30 g Oral TID   levothyroxine  88 mcg Oral QAC breakfast   pantoprazole  40 mg Oral Daily   rifaximin  550 mg Oral BID   sertraline  100 mg Oral Daily  sodium chloride flush  3 mL Intravenous Q12H   spironolactone  100 mg Oral Daily   traZODone  50 mg Oral QHS   Continuous Infusions:  furosemide (LASIX) 200 mg in dextrose 5 % 100 mL (2 mg/mL) infusion 20 mg/hr (01/23/23 0821)     LOS: 2 days    Time spent:    Zannie Cove, MD [facility]   01/23/2023, 10:37 AM

## 2023-01-23 NOTE — Progress Notes (Signed)
Daily Progress Note   Patient Name: Daniel Crawford       Date: 01/23/2023 DOB: [birth year]  Age: 67 y.o. MRN#: [id number] Attending Physician: Zannie Cove, MD Primary Care Physician: [name], MD Admit Date: 01/21/2023  Reason for Consultation/Follow-up: Establishing goals of care  Subjective: I have reviewed medical records including EPIC notes, MAR, and labs. Received report from primary RN - no acute concerns.  Went to visit patient at bedside - no family/visitors present. Patient was lying in bed awake, alert, oriented, and able to participate in conversation. No signs or non-verbal gestures of pain or discomfort noted. No respiratory distress, increased work of breathing, or secretions; wheezing noted. Patient denies pain; endorses shortness of breath with exertion. Lasix drip infusing. HF PA seen during visit.  Emotional support provided to patient.  Allowed space and time for patient to review goals of care discussions as had with my colleague yesterday -patient states most of what he remembers is discussion around morphine.  Medication education completed.  Therapeutic listening provided as patient reflects over his health decline this past year, the passing of his dog, and the relationships/good support he has with family/friends.   We discussed patient's current illness and what it means in the larger context of patient's on-going co-morbidities.  Patient understands that CKD, CHF, Elita Boone cirrhosis are progressive, non-curable disease underlying the patient's current acute medical conditions.  Patient has a clear understanding of his current acute medical situation.  Patient clearly recognizes his health decline and understands that his time between hospitalizations are shorter and  shorter due to disease progression.  Natural disease trajectory and expectations at EOL were discussed. I attempted to elicit values and goals of care important to the patient. The difference between aggressive medical intervention and comfort care was considered in light of the patient's goals of care.   Patient is clear that he would never want nursing home placement.  At this time patient is hopeful for discharge home with home health.  Patient tells me he "feels more secure and relaxed" at home.  Patient tells me that his quality of life is steadily declining as his health declines.  Previously, patient was able to go to the grocery store once a week and get to his doctors appointments; however, otherwise spent his time at home. We discussed appropriate times for  hospice enrollment are when medical interventions are no longer working, someone no longer desires aggressive interventions, symptoms are getting harder to manage, frequent hospitalizations, poor quality of life.  Patient tells me "I know I will need them soon but I am not ready for hospice yet."  Per patient, he was previously enrolled for home hospice with [facility]; however, discontinued their services when he learned that they would not be monitoring his lab work.   Concepts specific to advance directives and CODE STATUS were discussed in detail.  Patient does have an HC POA: 1. [name] and 2. Sande Brothers.  Patient indicates he is interested in updating his HCPOA to include his brother -will consult chaplain. Patient declines chaplain for emotional support.   Encouraged patient to consider DNR/DNI status understanding evidenced based poor outcomes in similar hospitalized patient, as the cause of arrest is likely associated with advanced chronic/terminal illness rather than an easily reversible acute cardio-pulmonary event.  I shared that even if we pursued resuscitation we would not able to resolve the underlying factors.  I explained that DNR/DNI does not change the medical plan and it only comes into effect after a person has arrested (died).  It is a protective measure to keep [place] from harming the patient in their last moments of life. Patient understands that full resuscitative actions "are not going to fix the problems."  For now will continue full code. Patient is leaning toward code status change to DNR/DNI; however, requested time to think about this decision overnight.   Patient requests that I call his brother/Jake today to provide updates on his current medical situation as well as explain his chronic illnesses.  Patient also complains of constipation.  All questions and concerns addressed. Encouraged to call with questions and/or concerns. PMT card provided.  10:34 AM Attempted to call patient's brother/Jake to provide updates per patient's request - no answer - confidential voicemail left and PMT phone number provided with request to return call.   Length of Stay: 2  Current Medications: Scheduled Meds:   carvedilol  6.25 mg Oral BID WC   dapagliflozin propanediol  10 mg Oral QAC breakfast   insulin aspart  0-15 Units Subcutaneous TID WC   insulin aspart  0-5 Units Subcutaneous QHS   lactulose  20 g Oral BID   levothyroxine  88 mcg Oral QAC breakfast   pantoprazole  40 mg Oral Daily   rifaximin  550 mg Oral BID   sertraline  100 mg Oral Daily   sodium chloride flush  3 mL Intravenous Q12H   spironolactone  100 mg Oral Daily   traZODone  50 mg Oral QHS    Continuous Infusions:  furosemide (LASIX) 200 mg in dextrose 5 % 100 mL (2 mg/mL) infusion 20 mg/hr (01/23/23 0821)    PRN Meds: acetaminophen **OR** acetaminophen, albuterol, gabapentin, hydrOXYzine, morphine, ondansetron (ZOFRAN) IV, polyethylene glycol  Physical Exam Vitals and nursing note reviewed.  Constitutional:      General: He is not in acute distress.    Appearance: He is obese.  Pulmonary:     Effort: No respiratory  distress.     Comments: Dyspnea on exertion, wheezing Skin:    General: Skin is warm and dry.  Neurological:     Mental Status: He is alert and oriented to person, place, and time.     Motor: Weakness present.  Psychiatric:        Attention and Perception: Attention normal.  Behavior: Behavior is cooperative.        Cognition and Memory: Cognition and memory normal.             Vital Signs: BP (!) 117/53 (BP Location: Left Arm)   Pulse 78   Temp 98.3 F (36.8 C) (Oral)   Resp 13   Ht 6\' 2"  (1.88 m)   Wt (!) 174.3 kg   SpO2 95%   BMI 49.34 kg/m  SpO2: SpO2: 95 % O2 Device: O2 Device: Room Air O2 Flow Rate:    Intake/output summary:  Intake/Output Summary (Last 24 hours) at 01/23/2023 0951 Last data filed at 01/23/2023 0900 Gross per 24 hour  Intake 883.41 ml  Output 5175 ml  Net -4291.59 ml   LBM: Last BM Date : 01/23/23 Baseline Weight: Weight: (!) 176.6 kg Most recent weight: Weight: (!) 174.3 kg       Palliative Assessment/Data: PPS 60-70%      Patient Active Problem List   Diagnosis Date Noted   COPD (chronic obstructive pulmonary disease) (HCC) 01/21/2023   Acute on chronic diastolic (congestive) heart failure (HCC) 01/21/2023   Cellulitis and abscess of left leg 10/23/2022   Chronic hyponatremia 10/21/2022   T2DM (type 2 diabetes mellitus) (HCC) 07/06/2022   CKD stage 3a, GFR 45-59 ml/min (HCC) 07/06/2022   Umbilical hernia 07/05/2022   Morbid obesity (HCC) 05/25/2022   Liver cirrhosis secondary to NASH (HCC) 03/13/2022   Iron deficiency anemia 02/28/2022   GERD (gastroesophageal reflux disease) 02/28/2022   Generalized weakness 01/23/2022   Essential hypertension 01/23/2022   Volume overload 01/23/2022   Elevated TSH 01/22/2022   Protein calorie malnutrition (HCC) 01/22/2022   Chronic diastolic heart failure (HCC)    Hypogonadism, male 12/26/2021   History of colonic polyps 07/27/2021   Auditory hallucinations 07/27/2021   Cellulitis of  left leg 03/26/2021   OSA (obstructive sleep apnea) 01/20/2020   History of ascites 10/22/2019   Acquired hypothyroidism 10/16/2016   Depression with anxiety 10/16/2016   Thrombocytopenia (HCC) 10/16/2016   Spleen enlarged 10/16/2016   Class 3 severe obesity in adult [facility]) 02/28/2016    Palliative Care Assessment & Plan   Patient Profile: 66 y.o. male  with past medical history of hypertension, diabetes, GERD, hypothyroidism, CHF with EF 55%, CKD3, OSA, obesity, NASH cirrhosis, anemia, depression, anxiety admitted on 01/21/2023 with edema and shortness of breath.    Patient has had several admissions for recurrent heart failure exacerbations as well as hepatic encephalopathy, most recently from 9/26-10/1 at [facility]. He presents for this admission with 40lb weight gain and ongoing decline due to several chronic comorbidities/recurrent acute on chronic diastolic CHF.  Assessment: Principal Problem:   Acute on chronic diastolic (congestive) heart failure (HCC) Active Problems:   Acquired hypothyroidism   Depression with anxiety   Thrombocytopenia (HCC)   OSA (obstructive sleep apnea)   Essential hypertension   Volume overload   Iron deficiency anemia   GERD (gastroesophageal reflux disease)   Liver cirrhosis secondary to NASH (HCC)   Morbid obesity (HCC)   T2DM (type 2 diabetes mellitus) (HCC)   COPD (chronic obstructive pulmonary disease) (HCC)    Recommendations/Plan: Continue to treat the treatable Continue full code - considering DNR/DNI Goals are to return home with [facility]. He is not ready for hospice enrollment at this time but states "I know I will need them soon." Chaplain consulted for: updating HCPOA Attempted to call patient's brother again today - unable to reach - VM left Senokot  and Miralax for constipation  PMT will continue to follow and support holistically  Goals of Care and Additional Recommendations: Limitations on Scope of Treatment: Full Scope  Treatment  Code Status:    Code Status Orders  (From admission, onward)           Start     Ordered   01/21/23 1621  Full code  Continuous       Question:  By:  Answer:  Consent: discussion documented in EHR   01/21/23 1639           Code Status History     Date Active Date Inactive Code Status Order ID Comments User Context   12/20/2022 2159 12/25/2022 1534 Full Code [id number]  Starleen Arms, MD ED   10/19/2022 1708 10/26/2022 2223 Full Code [id number]  Levie Heritage, DO ED   10/02/2022 2056 10/05/2022 1814 Full Code [id number]  Onnie Boer, MD Inpatient   07/10/2022 2332 07/13/2022 1649 Full Code [id number]  Zierle-Ghosh, Asia B, DO ED   07/04/2022 1928 07/08/2022 1757 Full Code [id number]  Elgergawy, Leana Roe, MD ED   05/21/2022 2052 05/25/2022 1515 Full Code [id number]  Briscoe Deutscher, MD ED   04/07/2022 0219 04/10/2022 1813 Full Code [id number]  Frankey Shown, DO Inpatient   02/28/2022 0103 03/04/2022 1821 Full Code [id number]  Frankey Shown, DO ED   01/23/2022 0442 01/25/2022 2257 Full Code [id number]  Zierle-Ghosh, Asia B, DO ED   01/08/2022 1202 01/08/2022 1805 Full Code [id number]  Corky Crafts, MD Inpatient   04/18/2021 2025 04/23/2021 1920 Full Code [id number]  Zierle-Ghosh, [place] B, DO ED       Prognosis:  < 6 months would not be surprising   Discharge Planning: Home with Home Health  Care plan was discussed with primary RN, patient, HF PA, pharmacist   Thank you for allowing the Palliative Medicine Team to assist in the care of this patient.   Total Time 70 minutes Prolonged Time Billed  yes       Haskel Khan, NP  Please contact Palliative Medicine Team phone at [phone] for questions and concerns.   *Portions of this note are a verbal dictation therefore any spelling and/or grammatical errors are due to the "[employer]agon [employer] One" system interpretation.

## 2023-01-23 NOTE — Progress Notes (Addendum)
Advanced Heart Failure Rounding Note  PCP-Cardiologist: Nona Dell, MD   Subjective:     Good diuresis yesterday with lasix gtt at 20/hr + 5 mg metolazone.  Weight down 5 lb.   Seen with Palliative at bedside. Feels constipated. He thinks he needs more fluids.    Objective:   Weight Range: (!) 174.3 kg Body mass index is 49.34 kg/m.   Vital Signs:   Temp:  [97.7 F (36.5 C)-98.9 F (37.2 C)] 98.3 F (36.8 C) (10/30 0810) Pulse Rate:  [76-84] 78 (10/30 0810) Resp:  [12-20] 13 (10/30 0810) BP: (107-122)/(50-61) 117/53 (10/30 0810) SpO2:  [95 %-100 %] 95 % (10/30 0810) Weight:  [174.3 kg-176.6 kg] 174.3 kg (10/30 0359) Last BM Date : 01/23/23  Weight change: Filed Weights   01/22/23 1427 01/23/23 0359  Weight: (!) 176.6 kg (!) 174.3 kg    Intake/Output:   Intake/Output Summary (Last 24 hours) at 01/23/2023 0945 Last data filed at 01/23/2023 0900 Gross per 24 hour  Intake 883.41 ml  Output 5175 ml  Net -4291.59 ml      Physical Exam    General:  Chronically ill appearing HEENT: Normal Neck: Supple. JVP difficult d/t thick neck.  Cor: PMI nondisplaced. Regular rate & rhythm. No rubs, gallops or murmurs. Lungs:Diminished Abdomen: Morbidly obese, nontender, distended Extremities: No cyanosis, clubbing, rash, 3+ edema to waist, + UNNA boots Neuro: Alert & orientedx3. moves all 4 extremities w/o difficulty. Affect pleasant   Telemetry   SR 80s  Labs    CBC Recent Labs    01/22/23 0655 01/23/23 0354  WBC 4.0 4.2  HGB 8.5* 8.4*  HCT 27.0* 26.2*  MCV 87.9 85.1  PLT 63* 62*   Basic Metabolic Panel Recent Labs    03/47/42 1705 01/22/23 0655 01/22/23 1724 01/23/23 0354  NA  --    < > 135 134*  K  --    < > 4.2 3.6  CL  --    < > 96* 94*  CO2  --    < > 28 25  GLUCOSE  --    < > 132* 161*  BUN  --    < > 35* 37*  CREATININE  --    < > 2.27* 2.35*  CALCIUM  --    < > 9.1 8.9  MG 2.4  --   --   --   PHOS  --   --  4.1  --    < >  = values in this interval not displayed.   Liver Function Tests Recent Labs    01/22/23 0655 01/22/23 1724 01/23/23 0354  AST 57*  --  54*  ALT 22  --  25  ALKPHOS 71  --  67  BILITOT 3.7*  --  4.2*  PROT 6.2*  --  6.3*  ALBUMIN 2.6* 2.8* 2.6*   No results for input(s): "LIPASE", "AMYLASE" in the last 72 hours. Cardiac Enzymes No results for input(s): "CKTOTAL", "CKMB", "CKMBINDEX", "TROPONINI" in the last 72 hours.  BNP: BNP (last 3 results) Recent Labs    12/20/22 1418 01/10/23 1101 01/21/23 1359  BNP 56.0 47.0 31.7    ProBNP (last 3 results) No results for input(s): "PROBNP" in the last 8760 hours.   D-Dimer No results for input(s): "DDIMER" in the last 72 hours. Hemoglobin A1C No results for input(s): "HGBA1C" in the last 72 hours. Fasting Lipid Panel No results for input(s): "CHOL", "HDL", "LDLCALC", "TRIG", "CHOLHDL", "LDLDIRECT" in the last  72 hours. Thyroid Function Tests No results for input(s): "TSH", "T4TOTAL", "T3FREE", "THYROIDAB" in the last 72 hours.  Invalid input(s): "FREET3"  Other results:   Imaging    No results found.   Medications:     Scheduled Medications:  carvedilol  6.25 mg Oral BID WC   dapagliflozin propanediol  10 mg Oral QAC breakfast   insulin aspart  0-15 Units Subcutaneous TID WC   insulin aspart  0-5 Units Subcutaneous QHS   lactulose  20 g Oral BID   levothyroxine  88 mcg Oral QAC breakfast   pantoprazole  40 mg Oral Daily   rifaximin  550 mg Oral BID   sertraline  100 mg Oral Daily   sodium chloride flush  3 mL Intravenous Q12H   spironolactone  100 mg Oral Daily   traZODone  50 mg Oral QHS    Infusions:  furosemide (LASIX) 200 mg in dextrose 5 % 100 mL (2 mg/mL) infusion 20 mg/hr (01/23/23 0821)    PRN Medications: acetaminophen **OR** acetaminophen, albuterol, gabapentin, hydrOXYzine, morphine, ondansetron (ZOFRAN) IV, polyethylene glycol    Patient Profile  Daniel Crawford is a 67 year old with a history  of cirrhosis (liver bx 2023 Stage IV cirrhosis), NASH, Hypothyroidism, HTN, DMII, CKD StageIII, COPD, and HFpEF.    Admitted with A/C HFpEF.   Assessment/Plan  1. A/C HFpEF - Most recent Echo 06/2022 LVEF 55% with normal RV function.  - Eight admits over the last year with decompensated heart failure/anasarca and hepatic encephalopathy. Concern for compliance with home medications and fluid intake.  - Repeat echo - Markedly volume overloaded. Continue lasix gtt at 20/hr and give 5 mg metolazone.  - Continue spironolactone 100 mg daily  - Continue UNNA boots - Follow renal function closely.    2. Cirrhosis, NASH - Followed by GI in [place]. Stage IV cirrhosis, had liver biopsy 2023.  - On lactulose and rifaximin.  - Ammionia elevated at 60 - Will discuss lactulose dosing with primary team.    3. CKD Stage IIIb - Creatinine baseline previously 1.7-1.8, up to low 2s recently - cr on admit 1.96--> 2.35 today - Needs ongoing diuresis. Will follow closely. - Denies recent NSAIDs   4. DMII  - SSI + at bedtime insulin per primary   5. Anemia  -Hgb 8.4 (chronically 8s-9s). No obvious source of bleeding.  -Iron stores low but not sure how much benefit he would receive from IV iron at this point   6. GOC  -Currently full code. Long-term prognosis is poor. Multiple comorbidities including HFpEF, stage IV cirrhosis and CKD IIIb. Recurrent admissions over the last year.  -Appreciate palliative care input     Length of Stay: 2  Daniel Crawford N, PA-C  01/23/2023, 9:45 AM  Advanced Heart Failure Team Pager [phone] (M-F; 7a - 5p)  Please contact [facility] for night-coverage after hours (5p -7a ) and weekends on [email or web address]

## 2023-01-23 NOTE — Progress Notes (Signed)
  Echocardiogram 2D Echocardiogram has been performed.  Milda Smart 01/23/2023, 3:52 PM

## 2023-01-23 NOTE — Plan of Care (Signed)
Patient continues on lasix gtt, good urinary output. Using urinal and independent to the bathroom. Complained that the lactulose is not working and he feels constipated. He also said he had some blood in his BM. On a fluid restriction and staff has to monitor his intake. Complained of some numbness in his left leg once during the shift that passed. Unna boot was assessed and did not seem to be occluding, weak pulse felt. Patient accidentally pulled out iv during sleep, new one replaced. Complained of pain in his bilateral lower legs that he states he has all the time at home and he takes Tylenol for the pain, and that is all he needs. The order is for a pain score of 1-3 and his score was 7/10 but patient was adamant that is what he takes at home, Tylenol and nothing more.   Problem: Education: Goal: Ability to demonstrate management of disease process will improve Outcome: Progressing Goal: Ability to verbalize understanding of medication therapies will improve Outcome: Progressing Goal: Individualized Educational Video(s) Outcome: Progressing   Problem: Activity: Goal: Capacity to carry out activities will improve Outcome: Progressing   Problem: Cardiac: Goal: Ability to achieve and maintain adequate cardiopulmonary perfusion will improve Outcome: Progressing   Problem: Education: Goal: Ability to describe self-care measures that [place]me prevent or decrease complications (Diabetes Survival Skills Education) will improve Outcome: Progressing Goal: Individualized Educational Video(s) Outcome: Progressing   Problem: Coping: Goal: Ability to adjust to condition or change in health will improve Outcome: Progressing   Problem: Fluid Volume: Goal: Ability to maintain a balanced intake and output will improve Outcome: Progressing   Problem: Health Behavior/Discharge Planning: Goal: Ability to identify and utilize available resources and services will improve Outcome: Progressing Goal:  Ability to manage health-related needs will improve Outcome: Progressing   Problem: Metabolic: Goal: Ability to maintain appropriate glucose levels will improve Outcome: Progressing   Problem: Nutritional: Goal: Maintenance of adequate nutrition will improve Outcome: Progressing Goal: Progress toward achieving an optimal weight will improve Outcome: Progressing   Problem: Skin Integrity: Goal: Risk for impaired skin integrity will decrease Outcome: Progressing   Problem: Tissue Perfusion: Goal: Adequacy of tissue perfusion will improve Outcome: Progressing   Problem: Education: Goal: Knowledge of General Education information will improve Description: Including pain rating scale, medication(s)/side effects and non-pharmacologic comfort measures Outcome: Progressing   Problem: Health Behavior/Discharge Planning: Goal: Ability to manage health-related needs will improve Outcome: Progressing   Problem: Clinical Measurements: Goal: Ability to maintain clinical measurements within normal limits will improve Outcome: Progressing Goal: Will remain free from infection Outcome: Progressing Goal: Diagnostic test results will improve Outcome: Progressing Goal: Respiratory complications will improve Outcome: Progressing Goal: Cardiovascular complication will be avoided Outcome: Progressing   Problem: Activity: Goal: Risk for activity intolerance will decrease Outcome: Progressing   Problem: Nutrition: Goal: Adequate nutrition will be maintained Outcome: Progressing   Problem: Coping: Goal: Level of anxiety will decrease Outcome: Progressing   Problem: Elimination: Goal: Will not experience complications related to bowel motility Outcome: Progressing Goal: Will not experience complications related to urinary retention Outcome: Progressing   Problem: Pain Management: Goal: General experience of comfort will improve Outcome: Progressing   Problem: Safety: Goal:  Ability to remain free from injury will improve Outcome: Progressing   Problem: Skin Integrity: Goal: Risk for impaired skin integrity will decrease Outcome: Progressing

## 2023-01-24 DIAGNOSIS — K7460 Unspecified cirrhosis of liver: Secondary | ICD-10-CM | POA: Diagnosis not present

## 2023-01-24 DIAGNOSIS — I5033 Acute on chronic diastolic (congestive) heart failure: Secondary | ICD-10-CM | POA: Diagnosis not present

## 2023-01-24 DIAGNOSIS — I509 Heart failure, unspecified: Secondary | ICD-10-CM | POA: Diagnosis not present

## 2023-01-24 DIAGNOSIS — Z66 Do not resuscitate: Secondary | ICD-10-CM

## 2023-01-24 DIAGNOSIS — R609 Edema, unspecified: Secondary | ICD-10-CM | POA: Diagnosis not present

## 2023-01-24 LAB — BASIC METABOLIC PANEL
Anion gap: 14 (ref 5–15)
Anion gap: 11 (ref 5–15)
BUN: 36 mg/dL — ABNORMAL HIGH (ref 8–23)
BUN: 37 mg/dL — ABNORMAL HIGH (ref 8–23)
CO2: 28 mmol/L (ref 22–32)
CO2: 33 mmol/L — ABNORMAL HIGH (ref 22–32)
Calcium: 9.1 mg/dL (ref 8.9–10.3)
Calcium: 9.3 mg/dL (ref 8.9–10.3)
Chloride: 91 mmol/L — ABNORMAL LOW (ref 98–111)
Chloride: 90 mmol/L — ABNORMAL LOW (ref 98–111)
Creatinine, Ser: 2.57 mg/dL — ABNORMAL HIGH (ref 0.61–1.24)
Creatinine, Ser: 2.45 mg/dL — ABNORMAL HIGH (ref 0.61–1.24)
GFR, Estimated: 27 mL/min — ABNORMAL LOW (ref >60)
GFR, Estimated: 29 mL/min — ABNORMAL LOW (ref >60)
Glucose, Bld: 139 mg/dL — ABNORMAL HIGH (ref 70–99)
Glucose, Bld: 131 mg/dL — ABNORMAL HIGH (ref 70–99)
Potassium: 3.1 mmol/L — ABNORMAL LOW (ref 3.5–5.1)
Potassium: 3.4 mmol/L — ABNORMAL LOW (ref 3.5–5.1)
Sodium: 133 mmol/L — ABNORMAL LOW (ref 135–145)
Sodium: 134 mmol/L — ABNORMAL LOW (ref 135–145)

## 2023-01-24 LAB — CBC
HCT: 27.9 % — ABNORMAL LOW (ref 39.0–52.0)
Hemoglobin: 9 g/dL — ABNORMAL LOW (ref 13.0–17.0)
MCH: 27.4 pg (ref 26.0–34.0)
MCHC: 32.3 g/dL (ref 30.0–36.0)
MCV: 84.8 fL (ref 80.0–100.0)
Platelets: 57 10*3/uL — ABNORMAL LOW (ref 150–400)
RBC: 3.29 MIL/uL — ABNORMAL LOW (ref 4.22–5.81)
RDW: 15.6 % — ABNORMAL HIGH (ref 11.5–15.5)
WBC: 4.5 10*3/uL (ref 4.0–10.5)
nRBC: 0 % (ref 0.0–0.2)

## 2023-01-24 LAB — GLUCOSE, CAPILLARY
Glucose-Capillary: 128 mg/dL — ABNORMAL HIGH (ref 70–99)
Glucose-Capillary: 135 mg/dL — ABNORMAL HIGH (ref 70–99)
Glucose-Capillary: 127 mg/dL — ABNORMAL HIGH (ref 70–99)
Glucose-Capillary: 133 mg/dL — ABNORMAL HIGH (ref 70–99)

## 2023-01-24 MED ORDER — POLYETHYLENE GLYCOL 3350 17 G PO PACK: 17.0000 g | PACK | Freq: Every day | ORAL | Status: DC | PRN

## 2023-01-24 MED ORDER — MORPHINE SULFATE (CONCENTRATE) 10 MG/0.5ML PO SOLN
5.0000 mg | ORAL | Status: DC | PRN
  Administered 2023-01-24 – 2023-01-27 (×4): 5 mg via ORAL
  Filled 2023-01-24 (×5): qty 0.5

## 2023-01-24 MED ORDER — ACETAZOLAMIDE 250 MG PO TABS
500.0000 mg | ORAL_TABLET | Freq: Once | ORAL | Status: AC
  Administered 2023-01-24: 500 mg via ORAL
  Filled 2023-01-24: qty 2

## 2023-01-24 MED ORDER — POTASSIUM CHLORIDE CRYS ER 20 MEQ PO TBCR
40.0000 meq | EXTENDED_RELEASE_TABLET | ORAL | Status: AC
  Administered 2023-01-24 (×2): 40 meq via ORAL
  Filled 2023-01-24 (×2): qty 2

## 2023-01-24 MED ORDER — POTASSIUM CHLORIDE CRYS ER 20 MEQ PO TBCR
40.0000 meq | EXTENDED_RELEASE_TABLET | ORAL | Status: AC
  Administered 2023-01-24 (×2): 40 meq via ORAL
  Filled 2023-01-24: qty 2
  Filled 2023-01-24: qty 4

## 2023-01-24 MED ORDER — METOPROLOL TARTRATE 5 MG/5ML IV SOLN: 5.0000 mg | INTRAVENOUS | Status: DC | PRN

## 2023-01-24 MED ORDER — HYDRALAZINE HCL 20 MG/ML IJ SOLN: 10.0000 mg | INTRAMUSCULAR | Status: DC | PRN

## 2023-01-24 MED ORDER — GUAIFENESIN 100 MG/5ML PO LIQD: 5.0000 mL | ORAL | Status: DC | PRN

## 2023-01-24 NOTE — Progress Notes (Signed)
This chaplain responded to PMT NP-Amber consult for updating the Pt. Advance Directive. This AD will supercede any previous Advance Directives. The Pt. completed AD education with the chaplain and answered clarifying questions. The Pt. will not complete a Living Will.  The chaplain listened reflectively as the Pt. talked about what adds meaning to his life. The chaplain understands "home" is where the Pt. is most comfortable. The Pt. prefers not to be a burden on any one while he maintains the relationships with his friends and doctors.  The Pt. named [name] as healthcare agent. The Pt. second choice is [name] and third choice is [name]; if the healthcare agent is unable or unwilling to serve in this role.  The chaplain is present with the Pt., notary, and witnesses for the notarizing of the Pt. HCPOA.    This chaplain gave the Pt. the original AD along with three copies. The chaplain scanned the Pt. AD into the Pt. EMR.  This chaplain is available for F/U spiritual care as needed.  Chaplain Stephanie Acre [phone]

## 2023-01-24 NOTE — Plan of Care (Signed)
  Problem: Education: Goal: Ability to demonstrate management of disease process will improve Outcome: Progressing Goal: Ability to verbalize understanding of medication therapies will improve Outcome: Progressing Goal: Individualized Educational Video(s) Outcome: Progressing   Problem: Activity: Goal: Capacity to carry out activities will improve Outcome: Progressing   Problem: Cardiac: Goal: Ability to achieve and maintain adequate cardiopulmonary perfusion will improve Outcome: Progressing   Problem: Education: Goal: Ability to describe self-care measures that may prevent or decrease complications (Diabetes Survival Skills Education) will improve Outcome: Progressing Goal: Individualized Educational Video(s) Outcome: Progressing   Problem: Coping: Goal: Ability to adjust to condition or change in health will improve Outcome: Progressing   Problem: Fluid Volume: Goal: Ability to maintain a balanced intake and output will improve Outcome: Progressing   Problem: Health Behavior/Discharge Planning: Goal: Ability to identify and utilize available resources and services will improve Outcome: Progressing Goal: Ability to manage health-related needs will improve Outcome: Progressing   Problem: Metabolic: Goal: Ability to maintain appropriate glucose levels will improve Outcome: Progressing   Problem: Nutritional: Goal: Maintenance of adequate nutrition will improve Outcome: Progressing Goal: Progress toward achieving an optimal weight will improve Outcome: Progressing   Problem: Skin Integrity: Goal: Risk for impaired skin integrity will decrease Outcome: Progressing   Problem: Tissue Perfusion: Goal: Adequacy of tissue perfusion will improve Outcome: Progressing   Problem: Education: Goal: Knowledge of General Education information will improve Description: Including pain rating scale, medication(s)/side effects and non-pharmacologic comfort measures Outcome:  Progressing   Problem: Health Behavior/Discharge Planning: Goal: Ability to manage health-related needs will improve Outcome: Progressing   Problem: Clinical Measurements: Goal: Ability to maintain clinical measurements within normal limits will improve Outcome: Progressing Goal: Will remain free from infection Outcome: Progressing Goal: Diagnostic test results will improve Outcome: Progressing Goal: Respiratory complications will improve Outcome: Progressing Goal: Cardiovascular complication will be avoided Outcome: Progressing   Problem: Activity: Goal: Risk for activity intolerance will decrease Outcome: Progressing   Problem: Nutrition: Goal: Adequate nutrition will be maintained Outcome: Progressing   Problem: Coping: Goal: Level of anxiety will decrease Outcome: Progressing   Problem: Elimination: Goal: Will not experience complications related to bowel motility Outcome: Progressing Goal: Will not experience complications related to urinary retention Outcome: Progressing   Problem: Pain Management: Goal: General experience of comfort will improve Outcome: Progressing   Problem: Safety: Goal: Ability to remain free from injury will improve Outcome: Progressing   Problem: Skin Integrity: Goal: Risk for impaired skin integrity will decrease Outcome: Progressing

## 2023-01-24 NOTE — Progress Notes (Signed)
Staff attempted to get pt up to chair pt stated maybe later will attempt later

## 2023-01-24 NOTE — Progress Notes (Addendum)
Advanced Heart Failure Rounding Note  PCP-Cardiologist: Nona Dell, MD   Subjective:   Echo 01/23/23: EF 55-60%, no RWMA, mild concentric LVH, normal RV, no MR, trivial TR  Great diuresis yesterday with lasix gtt at 20/hr + 5 mg metolazone +diamox 250 mg . - UOP,   Weight down 13 lb.   Feels ok just tired from getting up all night. Eventually primofit placed, feels better since that was placed.   Objective:   Weight Range: (!) 168.4 kg Body mass index is 47.67 kg/m.   Vital Signs:   Temp:  [98.2 F (36.8 C)-98.8 F (37.1 C)] 98.2 F (36.8 C) (10/31 0302) Pulse Rate:  [74-80] 74 (10/31 0302) Resp:  [11-18] 14 (10/31 0302) BP: (110-119)/(46-57) 112/56 (10/31 0302) SpO2:  [91 %-100 %] 94 % (10/31 0302) Weight:  [168.4 kg] 168.4 kg (10/31 0302) Last BM Date : 01/23/23  Weight change: Filed Weights   01/22/23 1427 01/23/23 0359 01/24/23 0302  Weight: (!) 176.6 kg (!) 174.3 kg (!) 168.4 kg    Intake/Output:   Intake/Output Summary (Last 24 hours) at 01/24/2023 0821 Last data filed at 01/24/2023 0650 Gross per 24 hour  Intake 483 ml  Output 7150 ml  Net -6667 ml     Physical Exam  General:  well appearing.  No respiratory difficulty HEENT: normal Neck: supple. JVD ~12 cm. Carotids 2+ bilat; no bruits. No lymphadenopathy or thyromegaly appreciated. Cor: PMI nondisplaced. Regular rate & rhythm. No rubs, gallops or murmurs. Lungs: clear Abdomen: soft, nontender, nondistended. No hepatosplenomegaly. No bruits or masses. Good bowel sounds. Extremities: no cyanosis, clubbing, rash, +1-2 BLE edema to knee. + UNNA boots Neuro: alert & oriented x 3, cranial nerves grossly intact. moves all 4 extremities w/o difficulty. Affect pleasant.   Telemetry   SR 70s (Personally reviewed)    Labs    CBC Recent Labs    01/23/23 0354 01/24/23 0342  WBC 4.2 4.5  HGB 8.4* 9.0*  HCT 26.2* 27.9*  MCV 85.1 84.8  PLT 62* 57*   Basic Metabolic Panel Recent Labs     01/21/23 1705 01/22/23 0655 01/22/23 1724 01/23/23 0354 01/24/23 0342  NA  --    < > 135 134* 133*  K  --    < > 4.2 3.6 3.1*  CL  --    < > 96* 94* 91*  CO2  --    < > 28 25 28   GLUCOSE  --    < > 132* 161* 139*  BUN  --    < > 35* 37* 36*  CREATININE  --    < > 2.27* 2.35* 2.57*  CALCIUM  --    < > 9.1 8.9 9.1  MG 2.4  --   --   --   --   PHOS  --   --  4.1  --   --    < > = values in this interval not displayed.   Liver Function Tests Recent Labs    01/22/23 0655 01/22/23 1724 01/23/23 0354  AST 57*  --  54*  ALT 22  --  25  ALKPHOS 71  --  67  BILITOT 3.7*  --  4.2*  PROT 6.2*  --  6.3*  ALBUMIN 2.6* 2.8* 2.6*   No results for input(s): "LIPASE", "AMYLASE" in the last 72 hours. Cardiac Enzymes No results for input(s): "CKTOTAL", "CKMB", "CKMBINDEX", "TROPONINI" in the last 72 hours.  BNP: BNP (last 3 results) Recent Labs  12/20/22 1418 01/10/23 1101 01/21/23 1359  BNP 56.0 47.0 31.7    ProBNP (last 3 results) No results for input(s): "PROBNP" in the last 8760 hours.   D-Dimer No results for input(s): "DDIMER" in the last 72 hours. Hemoglobin A1C No results for input(s): "HGBA1C" in the last 72 hours. Fasting Lipid Panel No results for input(s): "CHOL", "HDL", "LDLCALC", "TRIG", "CHOLHDL", "LDLDIRECT" in the last 72 hours. Thyroid Function Tests No results for input(s): "TSH", "T4TOTAL", "T3FREE", "THYROIDAB" in the last 72 hours.  Invalid input(s): "FREET3"  Other results:   Imaging    ECHOCARDIOGRAM COMPLETE  Result Date: 01/23/2023    ECHOCARDIOGRAM REPORT   Patient Name:   Saadiq Iseman    Date of Exam: 01/23/2023 Medical Rec #:  [id number]  Height:       74.0 in Accession #:    [id number] Weight:       384.3 lb Date of Birth:  05-14-1955 BSA:          2.869 m Patient Age:    67 years   BP:           119/57 mmHg Patient Gender: M          HR:           73 bpm. Exam Location:  Inpatient Procedure: 2D Echo, Cardiac Doppler, Color Doppler and  Intracardiac            Opacification Agent Indications:    CHF  History:        Patient has prior history of Echocardiogram examinations, most                 recent 07/07/2022. CHF, Signs/Symptoms:Dyspnea and Edema; Risk                 Factors:Hypertension and Diabetes. NASH, CKD, anasarca.  Sonographer:    Milda Smart Referring Phys: Benay Spice [facility]  Sonographer Comments: Patient is obese. Image acquisition challenging due to patient body habitus and Image acquisition challenging due to respiratory motion. IMPRESSIONS  1. Left ventricular ejection fraction, by estimation, is 55 to 60%. The left ventricle has normal function. The left ventricle has no regional wall motion abnormalities. There is mild concentric left ventricular hypertrophy. Left ventricular diastolic parameters were normal.  2. Right ventricular systolic function is normal. The right ventricular size is normal. Tricuspid regurgitation signal is inadequate for assessing PA pressure.  3. The mitral valve is grossly normal. No evidence of mitral valve regurgitation. No evidence of mitral stenosis.  4. The aortic valve is tricuspid. Aortic valve regurgitation is not visualized. No aortic stenosis is present.  5. Aortic dilatation noted. There is mild dilatation of the aortic root, measuring 43 mm. There is mild dilatation of the ascending aorta, measuring 42 mm. Comparison(s): No significant change from prior study. FINDINGS  Left Ventricle: Left ventricular ejection fraction, by estimation, is 55 to 60%. The left ventricle has normal function. The left ventricle has no regional wall motion abnormalities. Definity contrast agent was given IV to delineate the left ventricular  endocardial borders. The left ventricular internal cavity size was normal in size. There is mild concentric left ventricular hypertrophy. Left ventricular diastolic parameters were normal. Right Ventricle: The right ventricular size is normal. No increase in right  ventricular wall thickness. Right ventricular systolic function is normal. Tricuspid regurgitation signal is inadequate for assessing PA pressure. Left Atrium: Left atrial size was normal in size. Right Atrium: Right atrial size was normal in size. Pericardium: There  is no evidence of pericardial effusion. Mitral Valve: The mitral valve is grossly normal. No evidence of mitral valve regurgitation. No evidence of mitral valve stenosis. Tricuspid Valve: The tricuspid valve is grossly normal. Tricuspid valve regurgitation is trivial. No evidence of tricuspid stenosis. Aortic Valve: The aortic valve is tricuspid. Aortic valve regurgitation is not visualized. No aortic stenosis is present. Pulmonic Valve: The pulmonic valve was grossly normal. Pulmonic valve regurgitation is not visualized. No evidence of pulmonic stenosis. Aorta: Aortic dilatation noted. There is mild dilatation of the aortic root, measuring 43 mm. There is mild dilatation of the ascending aorta, measuring 42 mm. Venous: The inferior vena cava was not well visualized. IAS/Shunts: The atrial septum is grossly normal.  LEFT VENTRICLE PLAX 2D LVIDd:         5.30 cm   Diastology LVIDs:         4.30 cm   LV e' medial:    12.00 cm/s LV PW:         1.20 cm   LV E/e' medial:  7.8 LV IVS:        1.20 cm   LV e' lateral:   18.10 cm/s LVOT diam:     2.40 cm   LV E/e' lateral: 5.2 LV SV:         131 LV SV Index:   46 LVOT Area:     4.52 cm  RIGHT VENTRICLE RV S prime:     18.30 cm/s TAPSE (M-mode): 3.8 cm LEFT ATRIUM             Index        RIGHT ATRIUM           Index LA diam:        4.60 cm 1.60 cm/m   RA Area:     25.35 cm LA Vol (A2C):   62.7 ml 21.86 ml/m  RA Volume:   83.45 ml  29.09 ml/m LA Vol (A4C):   53.2 ml 18.55 ml/m LA Biplane Vol: 58.7 ml 20.46 ml/m  AORTIC VALVE LVOT Vmax:   132.00 cm/s LVOT Vmean:  102.000 cm/s LVOT VTI:    0.290 m  AORTA Ao Root diam: 4.30 cm Ao Asc diam:  4.20 cm MITRAL VALVE MV Area (PHT): 3.03 cm    SHUNTS MV Decel  Time: 250 msec    Systemic VTI:  0.29 m MV E velocity: 93.80 cm/s  Systemic Diam: 2.40 cm MV A velocity: 78.40 cm/s MV E/A ratio:  1.20 Lennie Odor MD Electronically signed by Lennie Odor MD Signature Date/Time: 01/23/2023/5:13:32 PM    Final      Medications:     Scheduled Medications:  carvedilol  6.25 mg Oral BID WC   dapagliflozin propanediol  10 mg Oral QAC breakfast   insulin aspart  0-15 Units Subcutaneous TID WC   insulin aspart  0-5 Units Subcutaneous QHS   lactulose  30 g Oral TID   levothyroxine  88 mcg Oral QAC breakfast   pantoprazole  40 mg Oral Daily   polyethylene glycol  17 g Oral Daily   potassium chloride  40 mEq Oral Q4H   rifaximin  550 mg Oral BID   senna-docusate  1 tablet Oral BID   sertraline  100 mg Oral Daily   sodium chloride flush  3 mL Intravenous Q12H   spironolactone  100 mg Oral Daily   traZODone  50 mg Oral QHS    Infusions:  furosemide (LASIX) 200 mg in dextrose 5 %  100 mL (2 mg/mL) infusion 20 mg/hr (01/24/23 0439)    PRN Medications: acetaminophen **OR** acetaminophen, albuterol, gabapentin, guaiFENesin, hydrALAZINE, hydrOXYzine, metoprolol tartrate, morphine, ondansetron (ZOFRAN) IV    Patient Profile  Mr Ogata is a 67 year old with a history of cirrhosis (liver bx 2023 Stage IV cirrhosis), NASH, Hypothyroidism, HTN, DMII, CKD StageIII, COPD, and HFpEF.    Admitted with A/C HFpEF.  Assessment/Plan  1. A/C HFpEF - Most recent Echo 06/2022 LVEF 55% with normal RV function.  - Eight admits over the last year with decompensated heart failure/anasarca and hepatic encephalopathy. Concern for compliance with home medications and fluid intake.  - Echo 01/23/23: EF 55-60%, no RWMA, mild concentric LVH, normal RV, no MR, trivial TR - Markedly volume overloaded. Continue lasix gtt at 20/hr and 500mg  diamox x1. Would aggressively diurese through today with plans to cut back tomorrow.   - Continue spironolactone 100 mg daily  - Continue UNNA  boots - Follow renal function closely.    2. Cirrhosis, NASH - Followed by GI in [place]. Stage IV cirrhosis, had liver biopsy 2023.  - On lactulose and rifaximin.  - Ammionia elevated at 60 - Continue lactulose per primary team.    3. CKD Stage IIIb - Creatinine baseline previously 1.7-1.8, up to low 2s recently - cr on admit 1.96--> 2.35> 2.57 today - Needs ongoing diuresis. Will follow closely.  - Denies recent NSAIDs   4. DMII  - SSI + at bedtime insulin per primary   5. Anemia  -Hgb 9 (chronically 8s-9s). No obvious source of bleeding.  -Iron stores low but not sure how much benefit he would receive from IV iron at this point   6. GOC  -Currently full code. Long-term prognosis is poor. Multiple comorbidities including HFpEF, stage IV cirrhosis and CKD IIIb. Recurrent admissions over the last year.  -Appreciate palliative care input    Length of Stay: 3  Alen Bleacher, NP  01/24/2023, 8:21 AM  Advanced Heart Failure Team Pager [phone] (M-F; 7a - 5p)  Please contact [facility] for night-coverage after hours (5p -7a ) and weekends on [email or web address]

## 2023-01-24 NOTE — TOC Progression Note (Signed)
Transition of Care [facility]) - Progression Note    Patient Details  Name: Daniel Crawford MRN: [id number] Date of Birth: [birth year]  Transition [facility] CM/SW Contact  Nicanor Bake Phone Number: [phone] 01/24/2023, 11:34 AM  Clinical Narrative:  HF CSW attempted to meet with pt at bedside but pt was sleep and repairmen was in the room. CSW will follow up with pt at a later time.   TOC will continue following.           Expected Discharge Plan and Services                                               Social Determinants of Health (SDOH) Interventions SDOH Screenings   Food Insecurity: No Food Insecurity (01/21/2023)  Housing: Low Risk  (01/21/2023)  Transportation Needs: No Transportation Needs (01/21/2023)  Utilities: Not At Risk (01/21/2023)  Depression (PHQ2-9): Medium Risk (10/27/2019)  Financial Resource Strain: Low Risk  (03/28/2021)   Received from [facility]  Tobacco Use: Medium Risk (01/21/2023)    Readmission Risk Interventions    12/21/2022    9:17 AM 10/20/2022    2:15 PM 10/04/2022    9:36 AM  Readmission Risk Prevention Plan  Transportation Screening Complete Complete Complete  Medication Review Oceanographer) Complete Complete Complete  PCP or Specialist appointment within 3-5 days of discharge  Not Complete   HRI or Home Care Consult Complete Complete Complete  SW Recovery Care/Counseling Consult Complete Complete Complete  Palliative Care Screening Not Applicable Not Applicable Not Applicable  Skilled Nursing Facility Not Applicable Not Applicable Not Applicable

## 2023-01-24 NOTE — Hospital Course (Addendum)
Daniel Crawford was admitted to the hospital with the working diagnosis of heart failure exacerbation.   62/M with history of cirrhosis, fatty liver disease, morbid obesity, diastolic CHF, CKD 3B/4, COPD, type 2 diabetes mellitus, hypothyroidism, hypertension, frequent hospitalizations with fluid overload, discharged from [facility] 4 weeks ago after admission for same.  Returns back to the hospital in CHF exacerbation with significant weight gain.   Patient has been placed on IV furosemide continuous infusion.   11/05 right heart catheterization with reasonably optimal filling pressures.  Possible discharge in 24 to 48 hrs.

## 2023-01-24 NOTE — Progress Notes (Signed)
PROGRESS NOTE    Marshon Mcaneny  ZOX:[id number] DOB: 03-07-1956 DOA: 01/21/2023 PCP: [name], MD    Brief summary:  62/M with history of cirrhosis, fatty liver disease, morbid obesity, diastolic CHF, CKD 3B/4, COPD, type 2 diabetes mellitus, hypothyroidism, hypertension, frequent hospitalizations with fluid overload, discharged from [facility] 4 weeks ago after admission for same.  Returns back to the hospital in CHF exacerbation with significant weight gain.    Assessment and Plan:   Acute on chronic diastolic CHF, class IV -Echo showed EF 55%.  Has had multiple hospitalizations due to volume overload since then.  Unfortunately has multiple other comorbidities.  Patient is now followed by heart failure team on Lasix drip.  Metolazone held.  Closely monitor electrolytes, daily weight.  Unna boots bilateral lower extremity.  Overall his condition is quite complicated especially in the setting of cirrhosis and advanced CKD stage IIIb  Palliative care has been involved.  Patient for now wishes to be full code.  Hypokalemia - As needed repletion.  Monitor mag and Phos as well   NASH cirrhosis No obvious evidence of encephalopathy.  Currently on Lasix drip, Aldactone, rifaximin.  Continue lactulose and adjust as necessary.  Needs to have 2-3 bowel movements daily.   AKI on CKD stage IIIb Baseline creatinine around 2.0.  Creatinine rising, 2.5.  Currently getting aggressively diuresed   Anemia of chronic disease, thrombocytopenia -Due to underlying chronic issues   Essential hypertension Getting aggressive diuresis.  Aldactone, Coreg.  IV as needed.   Diabetes mellitus type II Sliding scale and Accu-Cheks   GERD Continue daily Protonix   Hypothyroidism Continue Synthroid   OSA Reports had a recent negative study   Obesity - Noted, disc last importance of diet and lifestyle modification   Depression Anxiety Will continue home medications including sertraline,  hydroxyzine and trazodone.  Unfortunately patient has poor prognosis given multiple comorbidities.  Significant limitations in treatment options.   DVT prophylaxis: SCDs Code Status: DNR  DNI Family Communication: None present Disposition Plan: Still significantly volume overloaded.   Subjective: Feeling better no complaints  Physical exam: Constitutional: Not in acute distress Respiratory: Bibasilar rhonchi Cardiovascular: Normal sinus rhythm, no rubs, 12 cm JVD Abdomen: Nontender nondistended good bowel sounds Musculoskeletal: 2+ bilateral lower extremity edema Skin: No rashes seen Neurologic: CN 2-12 grossly intact.  And nonfocal Psychiatric: Normal judgment and insight. Alert and oriented x 3. Normal mood.             Diet Orders (From admission, onward)     Start     Ordered   01/21/23 1622  Diet Heart Room service appropriate? Yes; Fluid consistency: Thin; Fluid restriction: 1500 mL Fluid  Diet effective now       Question Answer Comment  Room service appropriate? Yes   Fluid consistency: Thin   Fluid restriction: 1500 mL Fluid      01/21/23 1639            Objective: Vitals:   01/23/23 1943 01/23/23 2351 01/24/23 0302 01/24/23 0821  BP: (!) 112/52 (!) 110/54 (!) 112/56 (!) 119/53  Pulse: 75 75 74 78  Resp: 11 13 14 16   Temp: 98.8 F (37.1 C) 98.4 F (36.9 C) 98.2 F (36.8 C) 97.7 F (36.5 C)  TempSrc: Oral Oral Oral Oral  SpO2: 91% 93% 94% 95%  Weight:   (!) 168.4 kg   Height:        Intake/Output Summary (Last 24 hours) at 01/24/2023 1236 Last data  filed at 01/24/2023 0900 Gross per 24 hour  Intake 243 ml  Output 6750 ml  Net -6507 ml   Filed Weights   01/22/23 1427 01/23/23 0359 01/24/23 0302  Weight: (!) 176.6 kg (!) 174.3 kg (!) 168.4 kg    Scheduled Meds:  carvedilol  6.25 mg Oral BID WC   dapagliflozin propanediol  10 mg Oral QAC breakfast   insulin aspart  0-15 Units Subcutaneous TID WC   insulin aspart  0-5 Units  Subcutaneous QHS   lactulose  30 g Oral TID   levothyroxine  88 mcg Oral QAC breakfast   pantoprazole  40 mg Oral Daily   rifaximin  550 mg Oral BID   senna-docusate  1 tablet Oral BID   sertraline  100 mg Oral Daily   sodium chloride flush  3 mL Intravenous Q12H   spironolactone  100 mg Oral Daily   traZODone  50 mg Oral QHS   Continuous Infusions:  furosemide (LASIX) 200 mg in dextrose 5 % 100 mL (2 mg/mL) infusion 20 mg/hr (01/24/23 0439)    Nutritional status     Body mass index is 47.67 kg/m.  Data Reviewed:   CBC: Recent Labs  Lab 01/21/23 1359 01/22/23 0655 01/23/23 0354 01/24/23 0342  WBC 5.0 4.0 4.2 4.5  HGB 9.7* 8.5* 8.4* 9.0*  HCT 30.5* 27.0* 26.2* 27.9*  MCV 86.9 87.9 85.1 84.8  PLT 68* 63* 62* 57*   Basic Metabolic Panel: Recent Labs  Lab 01/21/23 1359 01/21/23 1705 01/22/23 0655 01/22/23 1724 01/23/23 0354 01/24/23 0342  NA 131*  --  135 135 134* 133*  K 4.3  --  4.4 4.2 3.6 3.1*  CL 97*  --  100 96* 94* 91*  CO2 25  --  25 28 25 28   GLUCOSE 143*  --  130* 132* 161* 139*  BUN 31*  --  35* 35* 37* 36*  CREATININE 1.96*  --  2.06* 2.27* 2.35* 2.57*  CALCIUM 8.6*  --  8.8* 9.1 8.9 9.1  MG  --  2.4  --   --   --   --   PHOS  --   --   --  4.1  --   --    GFR: Estimated Creatinine Clearance: 47.9 mL/min (A) (by C-G formula based on SCr of 2.57 mg/dL (H)). Liver Function Tests: Recent Labs  Lab 01/21/23 1705 01/22/23 0655 01/22/23 1724 01/23/23 0354  AST 53* 57*  --  54*  ALT 23 22  --  25  ALKPHOS 87 71  --  67  BILITOT 3.3* 3.7*  --  4.2*  PROT 6.2* 6.2*  --  6.3*  ALBUMIN 2.6* 2.6* 2.8* 2.6*   No results for input(s): "LIPASE", "AMYLASE" in the last 168 hours. Recent Labs  Lab 01/22/23 1059  AMMONIA 60*   Coagulation Profile: Recent Labs  Lab 01/22/23 1724  INR 1.5*   Cardiac Enzymes: No results for input(s): "CKTOTAL", "CKMB", "CKMBINDEX", "TROPONINI" in the last 168 hours. BNP (last 3 results) No results for input(s):  "PROBNP" in the last 8760 hours. HbA1C: No results for input(s): "HGBA1C" in the last 72 hours. CBG: Recent Labs  Lab 01/23/23 1115 01/23/23 1652 01/23/23 2117 01/24/23 0615 01/24/23 1126  GLUCAP 161* 119* 129* 128* 135*   Lipid Profile: No results for input(s): "CHOL", "HDL", "LDLCALC", "TRIG", "CHOLHDL", "LDLDIRECT" in the last 72 hours. Thyroid Function Tests: No results for input(s): "TSH", "T4TOTAL", "FREET4", "T3FREE", "THYROIDAB" in the last 72 hours. Anemia  Panel: Recent Labs    01/23/23 0354  VITAMINB12 1,229*  FERRITIN 46  TIBC 361  IRON 56  RETICCTPCT 2.0   Sepsis Labs: No results for input(s): "PROCALCITON", "LATICACIDVEN" in the last 168 hours.  No results found for this or any previous visit (from the past 240 hour(s)).       Radiology Studies: ECHOCARDIOGRAM COMPLETE  Result Date: 01/23/2023    ECHOCARDIOGRAM REPORT   Patient Name:   Theoplis Ebron    Date of Exam: 01/23/2023 Medical Rec #:  [id number]  Height:       74.0 in Accession #:    [id number] Weight:       384.3 lb Date of Birth:  10-27-1955 BSA:          2.869 m Patient Age:    66 years   BP:           119/57 mmHg Patient Gender: M          HR:           73 bpm. Exam Location:  Inpatient Procedure: 2D Echo, Cardiac Doppler, Color Doppler and Intracardiac            Opacification Agent Indications:    CHF  History:        Patient has prior history of Echocardiogram examinations, most                 recent 07/07/2022. CHF, Signs/Symptoms:Dyspnea and Edema; Risk                 Factors:Hypertension and Diabetes. NASH, CKD, anasarca.  Sonographer:    Milda Smart Referring Phys: Benay Spice [facility]  Sonographer Comments: Patient is obese. Image acquisition challenging due to patient body habitus and Image acquisition challenging due to respiratory motion. IMPRESSIONS  1. Left ventricular ejection fraction, by estimation, is 55 to 60%. The left ventricle has normal function. The left ventricle has no regional  wall motion abnormalities. There is mild concentric left ventricular hypertrophy. Left ventricular diastolic parameters were normal.  2. Right ventricular systolic function is normal. The right ventricular size is normal. Tricuspid regurgitation signal is inadequate for assessing PA pressure.  3. The mitral valve is grossly normal. No evidence of mitral valve regurgitation. No evidence of mitral stenosis.  4. The aortic valve is tricuspid. Aortic valve regurgitation is not visualized. No aortic stenosis is present.  5. Aortic dilatation noted. There is mild dilatation of the aortic root, measuring 43 mm. There is mild dilatation of the ascending aorta, measuring 42 mm. Comparison(s): No significant change from prior study. FINDINGS  Left Ventricle: Left ventricular ejection fraction, by estimation, is 55 to 60%. The left ventricle has normal function. The left ventricle has no regional wall motion abnormalities. Definity contrast agent was given IV to delineate the left ventricular  endocardial borders. The left ventricular internal cavity size was normal in size. There is mild concentric left ventricular hypertrophy. Left ventricular diastolic parameters were normal. Right Ventricle: The right ventricular size is normal. No increase in right ventricular wall thickness. Right ventricular systolic function is normal. Tricuspid regurgitation signal is inadequate for assessing PA pressure. Left Atrium: Left atrial size was normal in size. Right Atrium: Right atrial size was normal in size. Pericardium: There is no evidence of pericardial effusion. Mitral Valve: The mitral valve is grossly normal. No evidence of mitral valve regurgitation. No evidence of mitral valve stenosis. Tricuspid Valve: The tricuspid valve is grossly normal. Tricuspid valve regurgitation is trivial. No  evidence of tricuspid stenosis. Aortic Valve: The aortic valve is tricuspid. Aortic valve regurgitation is not visualized. No aortic stenosis is  present. Pulmonic Valve: The pulmonic valve was grossly normal. Pulmonic valve regurgitation is not visualized. No evidence of pulmonic stenosis. Aorta: Aortic dilatation noted. There is mild dilatation of the aortic root, measuring 43 mm. There is mild dilatation of the ascending aorta, measuring 42 mm. Venous: The inferior vena cava was not well visualized. IAS/Shunts: The atrial septum is grossly normal.  LEFT VENTRICLE PLAX 2D LVIDd:         5.30 cm   Diastology LVIDs:         4.30 cm   LV e' medial:    12.00 cm/s LV PW:         1.20 cm   LV E/e' medial:  7.8 LV IVS:        1.20 cm   LV e' lateral:   18.10 cm/s LVOT diam:     2.40 cm   LV E/e' lateral: 5.2 LV SV:         131 LV SV Index:   46 LVOT Area:     4.52 cm  RIGHT VENTRICLE RV S prime:     18.30 cm/s TAPSE (M-mode): 3.8 cm LEFT ATRIUM             Index        RIGHT ATRIUM           Index LA diam:        4.60 cm 1.60 cm/m   RA Area:     25.35 cm LA Vol (A2C):   62.7 ml 21.86 ml/m  RA Volume:   83.45 ml  29.09 ml/m LA Vol (A4C):   53.2 ml 18.55 ml/m LA Biplane Vol: 58.7 ml 20.46 ml/m  AORTIC VALVE LVOT Vmax:   132.00 cm/s LVOT Vmean:  102.000 cm/s LVOT VTI:    0.290 m  AORTA Ao Root diam: 4.30 cm Ao Asc diam:  4.20 cm MITRAL VALVE MV Area (PHT): 3.03 cm    SHUNTS MV Decel Time: 250 msec    Systemic VTI:  0.29 m MV E velocity: 93.80 cm/s  Systemic Diam: 2.40 cm MV A velocity: 78.40 cm/s MV E/A ratio:  1.20 Lennie Odor MD Electronically signed by Lennie Odor MD Signature Date/Time: 01/23/2023/5:13:32 PM    Final            LOS: 3 days   Time spent= 35 mins    Miguel Rota, MD [facility]  If 7PM-7AM, please contact night-coverage  01/24/2023, 12:36 PM

## 2023-01-24 NOTE — Plan of Care (Signed)

## 2023-01-24 NOTE — Care Management Important Message (Signed)
Important Message  Patient Details  Name: Daniel Crawford MRN: [id number] Date of Birth: 12/06/1955   Important Message Given:  Yes - Medicare IM     [name] 01/24/2023, 2:08 PM

## 2023-01-24 NOTE — Progress Notes (Signed)
Patient refused to get out of bed though he was encouraged and offered several times.  Mobility team also saw this patient this AM and he also refused.  His BP was low this PM he was seen by Amanda/NP who asked to hold the PM dose of Coreg, but continue the IV Lasix.

## 2023-01-25 DIAGNOSIS — I5033 Acute on chronic diastolic (congestive) heart failure: Secondary | ICD-10-CM | POA: Diagnosis not present

## 2023-01-25 LAB — GLUCOSE, CAPILLARY
Glucose-Capillary: 113 mg/dL — ABNORMAL HIGH (ref 70–99)
Glucose-Capillary: 148 mg/dL — ABNORMAL HIGH (ref 70–99)
Glucose-Capillary: 135 mg/dL — ABNORMAL HIGH (ref 70–99)
Glucose-Capillary: 143 mg/dL — ABNORMAL HIGH (ref 70–99)

## 2023-01-25 LAB — BASIC METABOLIC PANEL
Anion gap: 12 (ref 5–15)
Anion gap: 12 (ref 5–15)
BUN: 37 mg/dL — ABNORMAL HIGH (ref 8–23)
BUN: 41 mg/dL — ABNORMAL HIGH (ref 8–23)
CO2: 33 mmol/L — ABNORMAL HIGH (ref 22–32)
CO2: 32 mmol/L (ref 22–32)
Calcium: 9 mg/dL (ref 8.9–10.3)
Calcium: 9.3 mg/dL (ref 8.9–10.3)
Chloride: 90 mmol/L — ABNORMAL LOW (ref 98–111)
Chloride: 90 mmol/L — ABNORMAL LOW (ref 98–111)
Creatinine, Ser: 2.49 mg/dL — ABNORMAL HIGH (ref 0.61–1.24)
Creatinine, Ser: 2.75 mg/dL — ABNORMAL HIGH (ref 0.61–1.24)
GFR, Estimated: 28 mL/min — ABNORMAL LOW (ref >60)
GFR, Estimated: 25 mL/min — ABNORMAL LOW (ref >60)
Glucose, Bld: 134 mg/dL — ABNORMAL HIGH (ref 70–99)
Glucose, Bld: 188 mg/dL — ABNORMAL HIGH (ref 70–99)
Potassium: 3.7 mmol/L (ref 3.5–5.1)
Potassium: 3.6 mmol/L (ref 3.5–5.1)
Sodium: 135 mmol/L (ref 135–145)
Sodium: 134 mmol/L — ABNORMAL LOW (ref 135–145)

## 2023-01-25 LAB — MAGNESIUM: Magnesium: 2.6 mg/dL — ABNORMAL HIGH (ref 1.7–2.4)

## 2023-01-25 LAB — CBC
HCT: 27.9 % — ABNORMAL LOW (ref 39.0–52.0)
Hemoglobin: 9.1 g/dL — ABNORMAL LOW (ref 13.0–17.0)
MCH: 27.8 pg (ref 26.0–34.0)
MCHC: 32.6 g/dL (ref 30.0–36.0)
MCV: 85.3 fL (ref 80.0–100.0)
Platelets: 66 10*3/uL — ABNORMAL LOW (ref 150–400)
RBC: 3.27 MIL/uL — ABNORMAL LOW (ref 4.22–5.81)
RDW: 15.7 % — ABNORMAL HIGH (ref 11.5–15.5)
WBC: 4.7 10*3/uL (ref 4.0–10.5)
nRBC: 0 % (ref 0.0–0.2)

## 2023-01-25 LAB — PHOSPHORUS: Phosphorus: 5.6 mg/dL — ABNORMAL HIGH (ref 2.5–4.6)

## 2023-01-25 MED ORDER — POTASSIUM CHLORIDE CRYS ER 20 MEQ PO TBCR: 40.0000 meq | EXTENDED_RELEASE_TABLET | Freq: Once | ORAL | Status: DC

## 2023-01-25 MED ORDER — POTASSIUM CHLORIDE CRYS ER 20 MEQ PO TBCR
40.0000 meq | EXTENDED_RELEASE_TABLET | Freq: Once | ORAL | Status: AC
  Administered 2023-01-25: 40 meq via ORAL
  Filled 2023-01-25: qty 2

## 2023-01-25 NOTE — Progress Notes (Addendum)
Advanced Heart Failure Rounding Note  PCP-Cardiologist: Nona Dell, MD   Subjective:   Echo 01/23/23: EF 55-60%,  mild concentric LVH, normal RV  Scr remains above baseline but stabilizing, 2.49 today.   Good diuresis again yesterday with lasix gtt + diamox. Wt down another 6 lb.  BP soft and having some orthostatic dizziness.  Objective:   Weight Range: (!) 165.9 kg Body mass index is 46.96 kg/m.   Vital Signs:   Temp:  [97.7 F (36.5 C)-98.2 F (36.8 C)] 98.2 F (36.8 C) (11/01 0745) Pulse Rate:  [66-80] 70 (11/01 0625) Resp:  [7-38] 13 (11/01 0745) BP: (91-113)/(39-55) 108/43 (11/01 0745) SpO2:  [90 %-99 %] 96 % (11/01 0745) Weight:  [165.9 kg] 165.9 kg (11/01 0600) Last BM Date : 01/24/23  Weight change: Filed Weights   01/23/23 0359 01/24/23 0302 01/25/23 0600  Weight: (!) 174.3 kg (!) 168.4 kg (!) 165.9 kg    Intake/Output:   Intake/Output Summary (Last 24 hours) at 01/25/2023 0854 Last data filed at 01/25/2023 0700 Gross per 24 hour  Intake 738.97 ml  Output 5250 ml  Net -4511.03 ml     Physical Exam  General:  Fatigued appearing. Sitting up in chair. HEENT: normal Neck: supple. Thick neck. No lymphadenopathy or thryomegaly appreciated. Cor: PMI nondisplaced. Regular rate & rhythm. No rubs, gallops or murmurs. Lungs: clear Abdomen: obese, soft, nontender, nondistended.  Extremities: no cyanosis, clubbing, rash, 2-3+ edema to waist, + UNNA Neuro: alert & oriented x 3. Affect pleasant   Telemetry   SR 60s-70s  Labs    CBC Recent Labs    01/24/23 0342 01/25/23 0446  WBC 4.5 4.7  HGB 9.0* 9.1*  HCT 27.9* 27.9*  MCV 84.8 85.3  PLT 57* 66*   Basic Metabolic Panel Recent Labs    78/29/56 1724 01/23/23 0354 01/24/23 1411 01/25/23 0446  NA 135   < > 134* 135  K 4.2   < > 3.4* 3.7  CL 96*   < > 90* 90*  CO2 28   < > 33* 33*  GLUCOSE 132*   < > 131* 134*  BUN 35*   < > 37* 37*  CREATININE 2.27*   < > 2.45* 2.49*  CALCIUM  9.1   < > 9.3 9.0  MG  --   --   --  2.6*  PHOS 4.1  --   --  5.6*   < > = values in this interval not displayed.   Liver Function Tests Recent Labs    01/22/23 1724 01/23/23 0354  AST  --  54*  ALT  --  25  ALKPHOS  --  67  BILITOT  --  4.2*  PROT  --  6.3*  ALBUMIN 2.8* 2.6*   No results for input(s): "LIPASE", "AMYLASE" in the last 72 hours. Cardiac Enzymes No results for input(s): "CKTOTAL", "CKMB", "CKMBINDEX", "TROPONINI" in the last 72 hours.  BNP: BNP (last 3 results) Recent Labs    12/20/22 1418 01/10/23 1101 01/21/23 1359  BNP 56.0 47.0 31.7    ProBNP (last 3 results) No results for input(s): "PROBNP" in the last 8760 hours.   D-Dimer No results for input(s): "DDIMER" in the last 72 hours. Hemoglobin A1C No results for input(s): "HGBA1C" in the last 72 hours. Fasting Lipid Panel No results for input(s): "CHOL", "HDL", "LDLCALC", "TRIG", "CHOLHDL", "LDLDIRECT" in the last 72 hours. Thyroid Function Tests No results for input(s): "TSH", "T4TOTAL", "T3FREE", "THYROIDAB" in the last 72  hours.  Invalid input(s): "FREET3"  Other results:   Imaging    No results found.   Medications:     Scheduled Medications:  carvedilol  6.25 mg Oral BID WC   dapagliflozin propanediol  10 mg Oral QAC breakfast   insulin aspart  0-15 Units Subcutaneous TID WC   insulin aspart  0-5 Units Subcutaneous QHS   lactulose  30 g Oral TID   levothyroxine  88 mcg Oral QAC breakfast   pantoprazole  40 mg Oral Daily   potassium chloride  40 mEq Oral Once   rifaximin  550 mg Oral BID   senna-docusate  1 tablet Oral BID   sertraline  100 mg Oral Daily   sodium chloride flush  3 mL Intravenous Q12H   spironolactone  100 mg Oral Daily   traZODone  50 mg Oral QHS    Infusions:  furosemide (LASIX) 200 mg in dextrose 5 % 100 mL (2 mg/mL) infusion 20 mg/hr (01/25/23 0259)    PRN Medications: acetaminophen **OR** acetaminophen, albuterol, gabapentin, guaiFENesin,  hydrALAZINE, hydrOXYzine, metoprolol tartrate, morphine CONCENTRATE, ondansetron (ZOFRAN) IV, polyethylene glycol    Patient Profile  Daniel Crawford is a 67 year old with a history of cirrhosis (liver bx 2023 Stage IV cirrhosis), NASH, Hypothyroidism, HTN, DMII, CKD StageIII, COPD, and HFpEF.    Admitted with A/C HFpEF.  Assessment/Plan  1. A/C HFpEF - Most recent Echo 06/2022 LVEF 55% with normal RV function.  - Eight admits over the last year with decompensated heart failure/anasarca and hepatic encephalopathy. Concern for compliance with home medications and fluid intake.  - Echo 01/23/23: EF 55-60%, no RWMA, mild concentric LVH, normal RV, no Daniel, trivial TR - Markedly volume overloaded. Continue lasix gtt at 20/hr. Will hold off on diamox today. Developing some orthostatic dizziness with aggressive diuresis. - Continue spironolactone 100 mg daily  - Will hold Coreg d/t soft BP and dizziness. No clear need for BB with HFpEF. - Continue Farxiga 10 mg daily - Continue UNNA boots - Follow renal function closely.    2. Cirrhosis, NASH - Followed by GI in [place]. Stage IV cirrhosis, had liver biopsy 2023.  - On lactulose and rifaximin.  - Ammionia elevated at 60 - Continue lactulose per primary team.    3. CKD Stage IIIb - Creatinine baseline previously 1.7-1.8, up to low 2s recently - cr on admit 1.96--> 2.35> 2.57>2.49 - Needs ongoing diuresis. Will follow closely.  - Denies recent NSAIDs   4. DMII  - SSI + at bedtime insulin per primary   5. Anemia  -Hgb 9.1 (chronically 8s-9s). No obvious source of bleeding.  -Iron stores low. Received IV iron.   6. GOC  -Code status changed to DNR/DNI. Agree, long-term prognosis is poor. -Appreciate palliative care input    Length of Stay: 4  Daniel Crawford N, PA-C  01/25/2023, 8:54 AM  Advanced Heart Failure Team Pager [phone] (M-F; 7a - 5p)  Please contact [facility] for night-coverage after hours (5p -7a ) and weekends on  [email or web address]

## 2023-01-25 NOTE — TOC Progression Note (Signed)
Transition of Care [facility]) - Progression Note    Patient Details  Name: Daniel Crawford MRN: [id number] Date of Birth: [birth year]  [facility] CM/SW Contact  Nicanor Bake Phone Number: [phone] 01/25/2023, 1:29 PM  Clinical Narrative:   HF CSW met with pt at bedside. CSW and pt discussed how the pt does not qualify for medicaid due to him being over the income amount. Pt stated that he understood and thanked the CSW for at least trying. Pt stated that he was feeling fatigued due to walking around earlier.   TOC will continue following.          Expected Discharge Plan and Services                                               Social Determinants of Health (SDOH) Interventions SDOH Screenings   Food Insecurity: No Food Insecurity (01/21/2023)  Housing: Low Risk  (01/21/2023)  Transportation Needs: No Transportation Needs (01/21/2023)  Utilities: Not At Risk (01/21/2023)  Depression (PHQ2-9): Medium Risk (10/27/2019)  Financial Resource Strain: Low Risk  (03/28/2021)   Received from [facility], [facility]  Tobacco Use: Medium Risk (01/21/2023)    Readmission Risk Interventions    12/21/2022    9:17 AM 10/20/2022    2:15 PM 10/04/2022    9:36 AM  Readmission Risk Prevention Plan  Transportation Screening Complete Complete Complete  Medication Review Oceanographer) Complete Complete Complete  PCP or Specialist appointment within 3-5 days of discharge  Not Complete   HRI or Home Care Consult Complete Complete Complete  SW Recovery Care/Counseling Consult Complete Complete Complete  Palliative Care Screening Not Applicable Not Applicable Not Applicable  Skilled Nursing Facility Not Applicable Not Applicable Not Applicable

## 2023-01-25 NOTE — Evaluation (Signed)
Physical Therapy Evaluation Patient Details Name: Daniel Crawford MRN: [id number] DOB: 1955-12-20 Today's Date: 01/25/2023  History of Present Illness  67 y/o male presents [facility] 01/21/23 w/ CHF exacerbation w/ significant weight gain. Prior admit from [facility] 4 weeks prior for same episode. PMHx: cirrhosis, fatty liver disease, morbid obesity, diastolic CHF, CKD 3B/4, COPD, type 2 diabetes mellitus, hypothyroidism, hypertension, frequent hospitalizations with fluid overload   Clinical Impression  Pt in bed upon arrival and agreeable to PT eval after encouragement. Prior to admission, pt was would lean on the wall to walk at home and use cane and RW for community ambulation. Earlier today, pt ambulated with mobility with CGA ~125 ft with RW. Pt reported being fatigued and emotionally exhausted at PT eval. Pt was agreeable to sit EOB with MinA. However, pt declined further session. Talked to pt about home set up and provided support as pt is processing his illnesses. Pt presents to therapy session close to baseline with decreased strength, balance, and mobility. Pt would benefit from acute skilled PT to address functional impairments. Recommending post-acute HHPT to work towards independence. Acute PT to follow.          If plan is discharge home, recommend the following: A little help with walking and/or transfers   Can travel by private vehicle    Yes    Equipment Recommendations None recommended by PT     Functional Status Assessment Patient has had a recent decline in their functional status and demonstrates the ability to make significant improvements in function in a reasonable and predictable amount of time.     Precautions / Restrictions Precautions Precautions: Fall Restrictions Weight Bearing Restrictions: No      Mobility  Bed Mobility Overal bed mobility: Needs Assistance Bed Mobility: Supine to Sit, Sit to Supine     Supine to sit: Min assist Sit to supine: Min assist    General bed mobility comments: MinA for trunk navigation    Transfers    General transfer comment: pt declined due to earlier mobility, feels fatigued and mentally exhausted          Balance Overall balance assessment: Needs assistance Sitting-balance support: Feet supported, Single extremity supported Sitting balance-Leahy Scale: Fair           Pertinent Vitals/Pain Pain Assessment Pain Assessment: No/denies pain    Home Living Family/patient expects to be discharged to:: Private residence Living Arrangements: Alone Available Help at Discharge: Friend(s);Available PRN/intermittently Type of Home: Apartment Home Access: Level entry       Home Layout: One level Home Equipment: Rolling Walker (2 wheels);Grab bars - tub/shower;Cane - quad;Shower Diplomatic Services operational officer (4 wheels)      Prior Function Prior Level of Function : Independent/Modified Independent;History of Falls (last six months)    Mobility Comments: Leans on walls within the home; quad cane or rollator for longer distances ADLs Comments: Independent     Extremity/Trunk Assessment   Upper Extremity Assessment Upper Extremity Assessment: Overall WFL for tasks assessed    Lower Extremity Assessment Lower Extremity Assessment: Generalized weakness    Cervical / Trunk Assessment Cervical / Trunk Assessment: Normal  Communication   Communication Communication: No apparent difficulties  Cognition Arousal: Alert Behavior During Therapy: WFL for tasks assessed/performed Overall Cognitive Status: Within Functional Limits for tasks assessed       General Comments General comments (skin integrity, edema, etc.): VSS on RA, pt became SOB after transfer due to bed being flat. B LE unna boots  PT Assessment Patient needs continued PT services  PT Problem List Decreased strength;Decreased activity tolerance;Decreased balance;Decreased mobility       PT Treatment Interventions DME  instruction;Gait training;Functional mobility training;Therapeutic activities;Therapeutic exercise;Balance training;Neuromuscular re-education;Patient/family education    PT Goals (Current goals can be found in the Care Plan section)  Acute Rehab PT Goals Patient Stated Goal: to go home PT Goal Formulation: With patient Time For Goal Achievement: 02/08/23 Potential to Achieve Goals: Good    Frequency Min 1X/week        AM-PAC PT "6 Clicks" Mobility  Outcome Measure Help needed turning from your back to your side while in a flat bed without using bedrails?: A Little Help needed moving from lying on your back to sitting on the side of a flat bed without using bedrails?: A Little Help needed moving to and from a bed to a chair (including a wheelchair)?: A Lot Help needed standing up from a chair using your arms (e.g., wheelchair or bedside chair)?: A Lot Help needed to walk in hospital room?: A Little Help needed climbing 3-5 steps with a railing? : A Lot 6 Click Score: 15    End of Session   Activity Tolerance: Patient limited by fatigue Patient left: in bed;with call bell/phone within reach;with bed alarm set Nurse Communication: Mobility status PT Visit Diagnosis: Unsteadiness on feet (R26.81);Muscle weakness (generalized) (M62.81)    Time: 1191-4782 PT Time Calculation (min) (ACUTE ONLY): 33 min   Charges:   PT Evaluation $PT Eval Low Complexity: 1 Low PT Treatments $Self Care/Home Management: 8-22 PT General Charges $$ ACUTE PT VISIT: 1 Visit         Hilton Cork, PT, DPT Secure Chat Preferred  Rehab Office [phone]   Arturo Morton Brion Aliment 01/25/2023, 3:22 PM

## 2023-01-25 NOTE — Progress Notes (Signed)
Mobility Specialist Progress Note;   01/25/23 1000  Mobility  Activity Ambulated with assistance in hallway  Level of Assistance Contact guard assist, steadying assist  Assistive Device Front wheel walker  Distance Ambulated (ft) 125 ft  Activity Response Tolerated well  Mobility Referral Yes  $Mobility charge 1 Mobility  Mobility Specialist Start Time (ACUTE ONLY) 1000  Mobility Specialist Stop Time (ACUTE ONLY) 1015  Mobility Specialist Time Calculation (min) (ACUTE ONLY) 15 min   Pt agreeable to mobility. Required minG assistance for STS and during ambulation. C/o feeling fatigued during session. Displayed SOB after exertion getting back to bed, pt stated this was normal. Pt back to bed with all needs met.   Caesar Bookman Mobility Specialist Please contact via SecureChat or Rehab Office [phone]

## 2023-01-25 NOTE — Progress Notes (Signed)
PROGRESS NOTE    Daniel Crawford  ZOX:[id number] DOB: [birth year] DOA: 01/21/2023 PCP: [name], MD    Brief summary:  67/M with history of cirrhosis, fatty liver disease, morbid obesity, diastolic CHF, CKD 3B/4, COPD, type 2 diabetes mellitus, hypothyroidism, hypertension, frequent hospitalizations with fluid overload, discharged from [facility] 4 weeks ago after admission for same.  Returns back to the hospital in CHF exacerbation with significant weight gain.  Patient seen by cardiology on aggressive diuresis, Lasix drip.  Monitor electrolytes as necessary.  Seen by palliative care, now patient is DNR/DNI.    Assessment and Plan:   Acute on chronic diastolic CHF, class IV -Echo showed EF 55%.  Has had multiple hospitalizations due to volume overload since then.  Unfortunately has multiple other comorbidities.  Patient is now followed by heart failure team on Lasix drip.  Metolazone held.  Closely monitor electrolytes, daily weight.  Unna boots bilateral lower extremity.  Overall his condition is quite complicated especially in the setting of cirrhosis and advanced CKD stage IIIb  Palliative care has been involved.  Patient for now wishes to be full code.  Hypokalemia - As needed repletion.  Monitor mag and Phos as well   NASH cirrhosis No obvious evidence of encephalopathy.  Currently on Lasix drip, Aldactone, rifaximin.  Continue lactulose and adjust as necessary.  Needs to have 2-3 bowel movements daily.   AKI on CKD stage IIIb Baseline creatinine around 2.0.  Creatinine rising, 2.5.  Currently getting aggressively diuresed   Anemia of chronic disease, thrombocytopenia -Due to underlying chronic issues   Essential hypertension Getting aggressive diuresis.  Aldactone, Coreg.  IV as needed.   Diabetes mellitus type II Sliding scale and Accu-Cheks   GERD Continue daily Protonix   Hypothyroidism Continue Synthroid   OSA Reports had a recent negative study    Obesity - Noted, disc last importance of diet and lifestyle modification   Depression Anxiety Will continue home medications including sertraline, hydroxyzine and trazodone.  Unfortunately patient has poor prognosis given multiple comorbidities.  Significant limitations in treatment options.   DVT prophylaxis: SCDs Code Status: DNR  DNI Family Communication: None present Disposition Plan: Still significantly volume overloaded.   Subjective: No complaints doing okay.  Working with Copywriter, advertising.  Physical exam: Constitutional: Not in acute distress Respiratory: Bibasilar rhonchi Cardiovascular: Normal sinus rhythm, no rubs, 12 cm JVD Abdomen: Nontender nondistended good bowel sounds Musculoskeletal: 2+ bilateral lower extremity edema Skin: No rashes seen Neurologic: CN 2-12 grossly intact.  And nonfocal Psychiatric: Normal judgment and insight. Alert and oriented x 3. Normal mood.              Diet Orders (From admission, onward)     Start     Ordered   01/21/23 1622  Diet Heart Room service appropriate? Yes; Fluid consistency: Thin; Fluid restriction: 1500 mL Fluid  Diet effective now       Question Answer Comment  Room service appropriate? Yes   Fluid consistency: Thin   Fluid restriction: 1500 mL Fluid      01/21/23 1639            Objective: Vitals:   01/25/23 0600 01/25/23 0625 01/25/23 0745 01/25/23 1213  BP:  (!) 92/45 (!) 108/43 (!) 91/59  Pulse:  70  75  Resp: 12 14 13 17   Temp:   98.2 F (36.8 C) (!) 97 F (36.1 C)  TempSrc:   Oral Axillary  SpO2:  98% 96% 98%  Weight: Marland Kitchen)  165.9 kg     Height:        Intake/Output Summary (Last 24 hours) at 01/25/2023 1226 Last data filed at 01/25/2023 1210 Gross per 24 hour  Intake 958.97 ml  Output 4750 ml  Net -3791.03 ml   Filed Weights   01/23/23 0359 01/24/23 0302 01/25/23 0600  Weight: (!) 174.3 kg (!) 168.4 kg (!) 165.9 kg    Scheduled Meds:  dapagliflozin propanediol  10 mg Oral QAC  breakfast   insulin aspart  0-15 Units Subcutaneous TID WC   insulin aspart  0-5 Units Subcutaneous QHS   lactulose  30 g Oral TID   levothyroxine  88 mcg Oral QAC breakfast   pantoprazole  40 mg Oral Daily   potassium chloride  40 mEq Oral Once   rifaximin  550 mg Oral BID   senna-docusate  1 tablet Oral BID   sertraline  100 mg Oral Daily   sodium chloride flush  3 mL Intravenous Q12H   spironolactone  100 mg Oral Daily   traZODone  50 mg Oral QHS   Continuous Infusions:  furosemide (LASIX) 200 mg in dextrose 5 % 100 mL (2 mg/mL) infusion 20 mg/hr (01/25/23 0259)    Nutritional status     Body mass index is 46.96 kg/m.  Data Reviewed:   CBC: Recent Labs  Lab 01/21/23 1359 01/22/23 0655 01/23/23 0354 01/24/23 0342 01/25/23 0446  WBC 5.0 4.0 4.2 4.5 4.7  HGB 9.7* 8.5* 8.4* 9.0* 9.1*  HCT 30.5* 27.0* 26.2* 27.9* 27.9*  MCV 86.9 87.9 85.1 84.8 85.3  PLT 68* 63* 62* 57* 66*   Basic Metabolic Panel: Recent Labs  Lab 01/21/23 1705 01/22/23 0655 01/22/23 1724 01/23/23 0354 01/24/23 0342 01/24/23 1411 01/25/23 0446  NA  --    < > 135 134* 133* 134* 135  K  --    < > 4.2 3.6 3.1* 3.4* 3.7  CL  --    < > 96* 94* 91* 90* 90*  CO2  --    < > 28 25 28  33* 33*  GLUCOSE  --    < > 132* 161* 139* 131* 134*  BUN  --    < > 35* 37* 36* 37* 37*  CREATININE  --    < > 2.27* 2.35* 2.57* 2.45* 2.49*  CALCIUM  --    < > 9.1 8.9 9.1 9.3 9.0  MG 2.4  --   --   --   --   --  2.6*  PHOS  --   --  4.1  --   --   --  5.6*   < > = values in this interval not displayed.   GFR: Estimated Creatinine Clearance: 49 mL/min (A) (by C-G formula based on SCr of 2.49 mg/dL (H)). Liver Function Tests: Recent Labs  Lab 01/21/23 1705 01/22/23 0655 01/22/23 1724 01/23/23 0354  AST 53* 57*  --  54*  ALT 23 22  --  25  ALKPHOS 87 71  --  67  BILITOT 3.3* 3.7*  --  4.2*  PROT 6.2* 6.2*  --  6.3*  ALBUMIN 2.6* 2.6* 2.8* 2.6*   No results for input(s): "LIPASE", "AMYLASE" in the last  168 hours. Recent Labs  Lab 01/22/23 1059  AMMONIA 60*   Coagulation Profile: Recent Labs  Lab 01/22/23 1724  INR 1.5*   Cardiac Enzymes: No results for input(s): "CKTOTAL", "CKMB", "CKMBINDEX", "TROPONINI" in the last 168 hours. BNP (last 3 results) No results  for input(s): "PROBNP" in the last 8760 hours. HbA1C: No results for input(s): "HGBA1C" in the last 72 hours. CBG: Recent Labs  Lab 01/24/23 1126 01/24/23 1609 01/24/23 2058 01/25/23 0605 01/25/23 1114  GLUCAP 135* 127* 133* 113* 148*   Lipid Profile: No results for input(s): "CHOL", "HDL", "LDLCALC", "TRIG", "CHOLHDL", "LDLDIRECT" in the last 72 hours. Thyroid Function Tests: No results for input(s): "TSH", "T4TOTAL", "FREET4", "T3FREE", "THYROIDAB" in the last 72 hours. Anemia Panel: Recent Labs    01/23/23 0354  VITAMINB12 1,229*  FERRITIN 46  TIBC 361  IRON 56  RETICCTPCT 2.0   Sepsis Labs: No results for input(s): "PROCALCITON", "LATICACIDVEN" in the last 168 hours.  No results found for this or any previous visit (from the past 240 hour(s)).       Radiology Studies: ECHOCARDIOGRAM COMPLETE  Result Date: 01/23/2023    ECHOCARDIOGRAM REPORT   Patient Name:   Daniel Crawford    Date of Exam: 01/23/2023 Medical Rec #:  [id number]  Height:       74.0 in Accession #:    [id number] Weight:       384.3 lb Date of Birth:  Oct 29, 1955 BSA:          2.869 m Patient Age:    66 years   BP:           119/57 mmHg Patient Gender: M          HR:           73 bpm. Exam Location:  Inpatient Procedure: 2D Echo, Cardiac Doppler, Color Doppler and Intracardiac            Opacification Agent Indications:    CHF  History:        Patient has prior history of Echocardiogram examinations, most                 recent 07/07/2022. CHF, Signs/Symptoms:Dyspnea and Edema; Risk                 Factors:Hypertension and Diabetes. NASH, CKD, anasarca.  Sonographer:    Milda Smart Referring Phys: Benay Spice [facility]  Sonographer Comments:  Patient is obese. Image acquisition challenging due to patient body habitus and Image acquisition challenging due to respiratory motion. IMPRESSIONS  1. Left ventricular ejection fraction, by estimation, is 55 to 60%. The left ventricle has normal function. The left ventricle has no regional wall motion abnormalities. There is mild concentric left ventricular hypertrophy. Left ventricular diastolic parameters were normal.  2. Right ventricular systolic function is normal. The right ventricular size is normal. Tricuspid regurgitation signal is inadequate for assessing PA pressure.  3. The mitral valve is grossly normal. No evidence of mitral valve regurgitation. No evidence of mitral stenosis.  4. The aortic valve is tricuspid. Aortic valve regurgitation is not visualized. No aortic stenosis is present.  5. Aortic dilatation noted. There is mild dilatation of the aortic root, measuring 43 mm. There is mild dilatation of the ascending aorta, measuring 42 mm. Comparison(s): No significant change from prior study. FINDINGS  Left Ventricle: Left ventricular ejection fraction, by estimation, is 55 to 60%. The left ventricle has normal function. The left ventricle has no regional wall motion abnormalities. Definity contrast agent was given IV to delineate the left ventricular  endocardial borders. The left ventricular internal cavity size was normal in size. There is mild concentric left ventricular hypertrophy. Left ventricular diastolic parameters were normal. Right Ventricle: The right ventricular size is normal. No increase in right  ventricular wall thickness. Right ventricular systolic function is normal. Tricuspid regurgitation signal is inadequate for assessing PA pressure. Left Atrium: Left atrial size was normal in size. Right Atrium: Right atrial size was normal in size. Pericardium: There is no evidence of pericardial effusion. Mitral Valve: The mitral valve is grossly normal. No evidence of mitral valve  regurgitation. No evidence of mitral valve stenosis. Tricuspid Valve: The tricuspid valve is grossly normal. Tricuspid valve regurgitation is trivial. No evidence of tricuspid stenosis. Aortic Valve: The aortic valve is tricuspid. Aortic valve regurgitation is not visualized. No aortic stenosis is present. Pulmonic Valve: The pulmonic valve was grossly normal. Pulmonic valve regurgitation is not visualized. No evidence of pulmonic stenosis. Aorta: Aortic dilatation noted. There is mild dilatation of the aortic root, measuring 43 mm. There is mild dilatation of the ascending aorta, measuring 42 mm. Venous: The inferior vena cava was not well visualized. IAS/Shunts: The atrial septum is grossly normal.  LEFT VENTRICLE PLAX 2D LVIDd:         5.30 cm   Diastology LVIDs:         4.30 cm   LV e' medial:    12.00 cm/s LV PW:         1.20 cm   LV E/e' medial:  7.8 LV IVS:        1.20 cm   LV e' lateral:   18.10 cm/s LVOT diam:     2.40 cm   LV E/e' lateral: 5.2 LV SV:         131 LV SV Index:   46 LVOT Area:     4.52 cm  RIGHT VENTRICLE RV S prime:     18.30 cm/s TAPSE (M-mode): 3.8 cm LEFT ATRIUM             Index        RIGHT ATRIUM           Index LA diam:        4.60 cm 1.60 cm/m   RA Area:     25.35 cm LA Vol (A2C):   62.7 ml 21.86 ml/m  RA Volume:   83.45 ml  29.09 ml/m LA Vol (A4C):   53.2 ml 18.55 ml/m LA Biplane Vol: 58.7 ml 20.46 ml/m  AORTIC VALVE LVOT Vmax:   132.00 cm/s LVOT Vmean:  102.000 cm/s LVOT VTI:    0.290 m  AORTA Ao Root diam: 4.30 cm Ao Asc diam:  4.20 cm MITRAL VALVE MV Area (PHT): 3.03 cm    SHUNTS MV Decel Time: 250 msec    Systemic VTI:  0.29 m MV E velocity: 93.80 cm/s  Systemic Diam: 2.40 cm MV A velocity: 78.40 cm/s MV E/A ratio:  1.20 Lennie Odor MD Electronically signed by Lennie Odor MD Signature Date/Time: 01/23/2023/5:13:32 PM    Final            LOS: 4 days   Time spent= 35 mins    Miguel Rota, MD [facility]  If 7PM-7AM, please contact  night-coverage  01/25/2023, 12:26 PM

## 2023-01-25 NOTE — Plan of Care (Signed)
  Problem: Education: Goal: Ability to demonstrate management of disease process will improve Outcome: Progressing Goal: Ability to verbalize understanding of medication therapies will improve Outcome: Progressing Goal: Individualized Educational Video(s) Outcome: Progressing   Problem: Activity: Goal: Capacity to carry out activities will improve Outcome: Progressing   Problem: Cardiac: Goal: Ability to achieve and maintain adequate cardiopulmonary perfusion will improve Outcome: Progressing   Problem: Education: Goal: Ability to describe self-care measures that may prevent or decrease complications (Diabetes Survival Skills Education) will improve Outcome: Progressing Goal: Individualized Educational Video(s) Outcome: Progressing   Problem: Coping: Goal: Ability to adjust to condition or change in health will improve Outcome: Progressing   Problem: Fluid Volume: Goal: Ability to maintain a balanced intake and output will improve Outcome: Progressing   Problem: Health Behavior/Discharge Planning: Goal: Ability to identify and utilize available resources and services will improve Outcome: Progressing Goal: Ability to manage health-related needs will improve Outcome: Progressing   Problem: Metabolic: Goal: Ability to maintain appropriate glucose levels will improve Outcome: Progressing   Problem: Nutritional: Goal: Maintenance of adequate nutrition will improve Outcome: Progressing Goal: Progress toward achieving an optimal weight will improve Outcome: Progressing   Problem: Skin Integrity: Goal: Risk for impaired skin integrity will decrease Outcome: Progressing   Problem: Tissue Perfusion: Goal: Adequacy of tissue perfusion will improve Outcome: Progressing   Problem: Education: Goal: Knowledge of General Education information will improve Description: Including pain rating scale, medication(s)/side effects and non-pharmacologic comfort measures Outcome:  Progressing   Problem: Health Behavior/Discharge Planning: Goal: Ability to manage health-related needs will improve Outcome: Progressing   Problem: Clinical Measurements: Goal: Ability to maintain clinical measurements within normal limits will improve Outcome: Progressing Goal: Will remain free from infection Outcome: Progressing Goal: Diagnostic test results will improve Outcome: Progressing Goal: Respiratory complications will improve Outcome: Progressing Goal: Cardiovascular complication will be avoided Outcome: Progressing   Problem: Activity: Goal: Risk for activity intolerance will decrease Outcome: Progressing   Problem: Nutrition: Goal: Adequate nutrition will be maintained Outcome: Progressing   Problem: Coping: Goal: Level of anxiety will decrease Outcome: Progressing   Problem: Elimination: Goal: Will not experience complications related to bowel motility Outcome: Progressing Goal: Will not experience complications related to urinary retention Outcome: Progressing   Problem: Pain Management: Goal: General experience of comfort will improve Outcome: Progressing   Problem: Safety: Goal: Ability to remain free from injury will improve Outcome: Progressing   Problem: Skin Integrity: Goal: Risk for impaired skin integrity will decrease Outcome: Progressing

## 2023-01-26 DIAGNOSIS — I5033 Acute on chronic diastolic (congestive) heart failure: Secondary | ICD-10-CM | POA: Diagnosis not present

## 2023-01-26 LAB — BASIC METABOLIC PANEL
Anion gap: 8 (ref 5–15)
BUN: 42 mg/dL — ABNORMAL HIGH (ref 8–23)
CO2: 34 mmol/L — ABNORMAL HIGH (ref 22–32)
Calcium: 8.9 mg/dL (ref 8.9–10.3)
Chloride: 90 mmol/L — ABNORMAL LOW (ref 98–111)
Creatinine, Ser: 2.89 mg/dL — ABNORMAL HIGH (ref 0.61–1.24)
GFR, Estimated: 24 mL/min — ABNORMAL LOW (ref >60)
Glucose, Bld: 167 mg/dL — ABNORMAL HIGH (ref 70–99)
Potassium: 3.5 mmol/L (ref 3.5–5.1)
Sodium: 132 mmol/L — ABNORMAL LOW (ref 135–145)

## 2023-01-26 LAB — GLUCOSE, CAPILLARY
Glucose-Capillary: 114 mg/dL — ABNORMAL HIGH (ref 70–99)
Glucose-Capillary: 150 mg/dL — ABNORMAL HIGH (ref 70–99)
Glucose-Capillary: 126 mg/dL — ABNORMAL HIGH (ref 70–99)
Glucose-Capillary: 144 mg/dL — ABNORMAL HIGH (ref 70–99)

## 2023-01-26 LAB — HEPATIC FUNCTION PANEL
ALT: 23 U/L (ref 0–44)
AST: 52 U/L — ABNORMAL HIGH (ref 15–41)
Albumin: 2.7 g/dL — ABNORMAL LOW (ref 3.5–5.0)
Alkaline Phosphatase: 79 U/L (ref 38–126)
Bilirubin, Direct: 1 mg/dL — ABNORMAL HIGH (ref 0.0–0.2)
Indirect Bilirubin: 2.4 mg/dL — ABNORMAL HIGH (ref 0.3–0.9)
Total Bilirubin: 3.4 mg/dL — ABNORMAL HIGH (ref 0.3–1.2)
Total Protein: 6.4 g/dL — ABNORMAL LOW (ref 6.5–8.1)

## 2023-01-26 LAB — CBC
HCT: 28.3 % — ABNORMAL LOW (ref 39.0–52.0)
Hemoglobin: 9.2 g/dL — ABNORMAL LOW (ref 13.0–17.0)
MCH: 27.4 pg (ref 26.0–34.0)
MCHC: 32.5 g/dL (ref 30.0–36.0)
MCV: 84.2 fL (ref 80.0–100.0)
Platelets: 67 10*3/uL — ABNORMAL LOW (ref 150–400)
RBC: 3.36 MIL/uL — ABNORMAL LOW (ref 4.22–5.81)
RDW: 15.9 % — ABNORMAL HIGH (ref 11.5–15.5)
WBC: 4.9 10*3/uL (ref 4.0–10.5)
nRBC: 0 % (ref 0.0–0.2)

## 2023-01-26 LAB — MAGNESIUM: Magnesium: 2.7 mg/dL — ABNORMAL HIGH (ref 1.7–2.4)

## 2023-01-26 NOTE — Progress Notes (Signed)
Progress Note  Patient Name: Daniel Crawford Date of Encounter: 01/26/2023  Primary Cardiologist: Nona Dell, MD  Interval Summary   Chart reviewed.  Lasix infusion stopped by hospitalist this morning.  Patient states that he has felt weak, but somewhat better today.  Legs remain dressed with Unna boots.  Appetite stable.  No orthopnea.  Vital Signs    Vitals:   01/25/23 2332 01/25/23 2340 01/26/23 0359 01/26/23 0745  BP: (!) 102/38  (!) 106/49 (!) 133/56  Pulse: 65  66 66  Resp: 10 12 10 17   Temp: 97.9 F (36.6 C)  98.1 F (36.7 C) 97.8 F (36.6 C)  TempSrc: Oral  Oral Oral  SpO2: 96%  99% 98%  Weight:   (!) 167.1 kg   Height:        Intake/Output Summary (Last 24 hours) at 01/26/2023 0847 Last data filed at 01/26/2023 4098 Gross per 24 hour  Intake 708.79 ml  Output 4100 ml  Net -3391.21 ml   Filed Weights   01/24/23 0302 01/25/23 0600 01/26/23 0359  Weight: (!) 168.4 kg (!) 165.9 kg (!) 167.1 kg    Physical Exam   GEN: No acute distress.   Neck: Difficult to assess JVP. Cardiac: RRR, no gallop.  Respiratory: Nonlabored. Clear to auscultation bilaterally. GI: Bowel sounds present. MS: Unna boots bilaterally. Neuro:  Nonfocal. Psych: Alert and oriented x 3. Normal affect.  ECG/Telemetry    Telemetry reviewed showing sinus rhythm with burst of SVT.  Labs    Chemistry Recent Labs  Lab 01/22/23 0655 01/22/23 1724 01/23/23 0354 01/24/23 0342 01/25/23 0446 01/25/23 1308 01/26/23 0252  NA 135 135 134*   < > 135 134* 132*  K 4.4 4.2 3.6   < > 3.7 3.6 3.5  CL 100 96* 94*   < > 90* 90* 90*  CO2 25 28 25    < > 33* 32 34*  GLUCOSE 130* 132* 161*   < > 134* 188* 167*  BUN 35* 35* 37*   < > 37* 41* 42*  CREATININE 2.06* 2.27* 2.35*   < > 2.49* 2.75* 2.89*  CALCIUM 8.8* 9.1 8.9   < > 9.0 9.3 8.9  PROT 6.2*  --  6.3*  --   --   --  6.4*  ALBUMIN 2.6* 2.8* 2.6*  --   --   --  2.7*  AST 57*  --  54*  --   --   --  52*  ALT 22  --  25  --   --   --  23   ALKPHOS 71  --  67  --   --   --  79  BILITOT 3.7*  --  4.2*  --   --   --  3.4*  GFRNONAA 35* 31* 30*   < > 28* 25* 24*  ANIONGAP 10 11 15    < > 12 12 8    < > = values in this interval not displayed.    Hematology Recent Labs  Lab 01/24/23 0342 01/25/23 0446 01/26/23 0252  WBC 4.5 4.7 4.9  RBC 3.29* 3.27* 3.36*  HGB 9.0* 9.1* 9.2*  HCT 27.9* 27.9* 28.3*  MCV 84.8 85.3 84.2  MCH 27.4 27.8 27.4  MCHC 32.3 32.6 32.5  RDW 15.6* 15.7* 15.9*  PLT 57* 66* 67*   Cardiac Enzymes Recent Labs  Lab 01/21/23 1359 01/21/23 1705  TROPONINIHS 10 12    Cardiac Studies   Echocardiogram 01/23/2023:  1. Left ventricular ejection  fraction, by estimation, is 55 to 60%. The  left ventricle has normal function. The left ventricle has no regional  wall motion abnormalities. There is mild concentric left ventricular  hypertrophy. Left ventricular diastolic  parameters were normal.   2. Right ventricular systolic function is normal. The right ventricular  size is normal. Tricuspid regurgitation signal is inadequate for assessing  PA pressure.   3. The mitral valve is grossly normal. No evidence of mitral valve  regurgitation. No evidence of mitral stenosis.   4. The aortic valve is tricuspid. Aortic valve regurgitation is not  visualized. No aortic stenosis is present.   5. Aortic dilatation noted. There is mild dilatation of the aortic root,  measuring 43 mm. There is mild dilatation of the ascending aorta,  measuring 42 mm.   Assessment & Plan   1.  Acute on chronic HFpEF, LVEF 55 to 60% with normal RV contraction by recent echocardiogram.    2.  Stage IV NASH cirrhosis.  Also complicates fluid overload.  Remains on lactulose.  3.  AKI with CKD stage IIIb complicated by diuresis.  Creatinine up to 2.89.  4.  Hypothyroidism, TSH 1.183 on Synthroid.  Coreg discontinued yesterday and patient taken off Lasix infusion this morning by hospitalist team.  He has diuresed 19 to 20 L  overall.  Current weight listed at 167 kg which is close to his weight listed at heart failure clinic visit in mid October.  Would continue Farxiga 10 mg daily. Hold Aldactone today.  Follow-up BMET in a.m.  Likely plan to initiate Demadex at 40-60 mg twice daily starting tomorrow along with resumption of Aldactone.  For questions or updates, please contact [facility] Please consult [email or web address] for contact info under   Signed, Nona Dell, MD  01/26/2023, 8:47 AM

## 2023-01-26 NOTE — Progress Notes (Signed)
Patient is in room moaning that he doesn't feel well. Generalized pain "all over"

## 2023-01-26 NOTE — Plan of Care (Signed)
  Problem: Education: Goal: Ability to demonstrate management of disease process will improve Outcome: Progressing Goal: Ability to verbalize understanding of medication therapies will improve Outcome: Progressing Goal: Individualized Educational Video(s) Outcome: Progressing   Problem: Activity: Goal: Capacity to carry out activities will improve Outcome: Progressing   Problem: Cardiac: Goal: Ability to achieve and maintain adequate cardiopulmonary perfusion will improve Outcome: Progressing   Problem: Education: Goal: Ability to describe self-care measures that may prevent or decrease complications (Diabetes Survival Skills Education) will improve Outcome: Progressing Goal: Individualized Educational Video(s) Outcome: Progressing   Problem: Coping: Goal: Ability to adjust to condition or change in health will improve Outcome: Progressing   Problem: Fluid Volume: Goal: Ability to maintain a balanced intake and output will improve Outcome: Progressing   Problem: Health Behavior/Discharge Planning: Goal: Ability to identify and utilize available resources and services will improve Outcome: Progressing Goal: Ability to manage health-related needs will improve Outcome: Progressing   Problem: Metabolic: Goal: Ability to maintain appropriate glucose levels will improve Outcome: Progressing   Problem: Nutritional: Goal: Maintenance of adequate nutrition will improve Outcome: Progressing Goal: Progress toward achieving an optimal weight will improve Outcome: Progressing   Problem: Skin Integrity: Goal: Risk for impaired skin integrity will decrease Outcome: Progressing   Problem: Tissue Perfusion: Goal: Adequacy of tissue perfusion will improve Outcome: Progressing   Problem: Education: Goal: Knowledge of General Education information will improve Description: Including pain rating scale, medication(s)/side effects and non-pharmacologic comfort measures Outcome:  Progressing   Problem: Health Behavior/Discharge Planning: Goal: Ability to manage health-related needs will improve Outcome: Progressing   Problem: Clinical Measurements: Goal: Ability to maintain clinical measurements within normal limits will improve Outcome: Progressing Goal: Will remain free from infection Outcome: Progressing Goal: Diagnostic test results will improve Outcome: Progressing Goal: Respiratory complications will improve Outcome: Progressing Goal: Cardiovascular complication will be avoided Outcome: Progressing   Problem: Activity: Goal: Risk for activity intolerance will decrease Outcome: Progressing   Problem: Nutrition: Goal: Adequate nutrition will be maintained Outcome: Progressing   Problem: Coping: Goal: Level of anxiety will decrease Outcome: Progressing   Problem: Elimination: Goal: Will not experience complications related to bowel motility Outcome: Progressing Goal: Will not experience complications related to urinary retention Outcome: Progressing   Problem: Pain Management: Goal: General experience of comfort will improve Outcome: Progressing   Problem: Safety: Goal: Ability to remain free from injury will improve Outcome: Progressing   Problem: Skin Integrity: Goal: Risk for impaired skin integrity will decrease Outcome: Progressing

## 2023-01-26 NOTE — Progress Notes (Signed)
Mobility Specialist Progress Note:    01/26/23 1041  Mobility  Activity Ambulated with assistance in hallway  Level of Assistance Minimal assist, patient does 75% or more  Assistive Device Front wheel walker  Distance Ambulated (ft) 100 ft  Activity Response Tolerated well  Mobility Referral Yes  $Mobility charge 1 Mobility  Mobility Specialist Start Time (ACUTE ONLY) 0913  Mobility Specialist Stop Time (ACUTE ONLY) 0926  Mobility Specialist Time Calculation (min) (ACUTE ONLY) 13 min   Pt received in bed agreeable to mobility. Pt needed no assistance w/ bed mobility but MinA to stand. Pt c/o feeling weak today and nauseated, otherwise no c/o. Returned to room w/o fault. Attempted to get patient to recliner but requested to get back in bed. Agreed to get in chair in the afternoon. Will f/u as able.  Thompson Grayer Mobility Specialist  Please contact vis Secure Chat or  Rehab Office [phone]

## 2023-01-26 NOTE — Progress Notes (Signed)
PROGRESS NOTE    Daniel Crawford  NWG:[id number] DOB: 1956/03/25 DOA: 01/21/2023 PCP: [name], MD    Brief summary:  62/M with history of cirrhosis, fatty liver disease, morbid obesity, diastolic CHF, CKD 3B/4, COPD, type 2 diabetes mellitus, hypothyroidism, hypertension, frequent hospitalizations with fluid overload, discharged from [facility] 4 weeks ago after admission for same.  Returns back to the hospital in CHF exacerbation with significant weight gain.  Patient seen by cardiology on aggressive diuresis, Lasix drip.  Monitor electrolytes as necessary.  Seen by palliative care, now patient is DNR/DNI.    Assessment and Plan:   Acute on chronic diastolic CHF, class IV -Echo showed EF 55%.  Has had multiple hospitalizations due to volume overload since then.  Unfortunately has multiple other comorbidities.  Unna boots bilateral lower extremity.  Overall his condition is quite complicated especially in the setting of cirrhosis and advanced CKD stage IIIb.  Earlier in the week patient was on Lasix drip and metolazone.  Metolazone has been discontinued.  All those he still volume overloaded, with rising creatinine I we will pause Lasix drip and hold off on giving Farxiga and Aldactone until seen by cardiology.  Palliative care has been involved.  Patient is DNR/DNI  Hypokalemia - As needed repletion.  Monitor mag and Phos as well   NASH cirrhosis No obvious evidence of encephalopathy.  Currently on Lasix drip, Aldactone, rifaximin.  Continue lactulose and adjust as necessary.  Needs to have 2-3 bowel movements daily.   AKI on CKD stage IIIb Baseline creatinine around 2.0.  Creatinine rising, 2.5 > 2.9.  Currently getting aggressively diuresed   Anemia of chronic disease, thrombocytopenia -Due to underlying chronic issues   Essential hypertension Patient was getting diuresed aggressively.  Currently on IV as needed.   Diabetes mellitus type II Sliding scale and  Accu-Cheks   GERD Continue daily Protonix   Hypothyroidism Continue Synthroid   OSA Reports had a recent negative study   Obesity - Noted, disc last importance of diet and lifestyle modification   Depression Anxiety Will continue home medications including sertraline, hydroxyzine and trazodone.  Unfortunately patient has poor prognosis given multiple comorbidities.  Significant limitations in treatment options.   DVT prophylaxis: SCDs Code Status: DNR  DNI Family Communication: None present Disposition Plan: Still significantly volume overloaded.   Subjective: Seen at bedside on room air.  Denies any exertional dyspnea.  Ambulating with the help of mobility staff.  Physical exam: Constitutional: Not in acute distress Respiratory: Bibasilar rhonchi, improving Cardiovascular: Normal sinus rhythm, no rubs, 12 cm JVD Abdomen: Nontender nondistended good bowel sounds Musculoskeletal: 2+ bilateral lower extremity edema Skin: No rashes seen Neurologic: CN 2-12 grossly intact.  And nonfocal Psychiatric: Normal judgment and insight. Alert and oriented x 3. Normal mood.               Diet Orders (From admission, onward)     Start     Ordered   01/21/23 1622  Diet Heart Room service appropriate? Yes; Fluid consistency: Thin; Fluid restriction: 1500 mL Fluid  Diet effective now       Question Answer Comment  Room service appropriate? Yes   Fluid consistency: Thin   Fluid restriction: 1500 mL Fluid      01/21/23 1639            Objective: Vitals:   01/25/23 2332 01/25/23 2340 01/26/23 0359 01/26/23 0745  BP: (!) 102/38  (!) 106/49 (!) 133/56  Pulse: 65  66 66  Resp: 10 12 10 17   Temp: 97.9 F (36.6 C)  98.1 F (36.7 C) 97.8 F (36.6 C)  TempSrc: Oral  Oral Oral  SpO2: 96%  99% 98%  Weight:   (!) 167.1 kg   Height:        Intake/Output Summary (Last 24 hours) at 01/26/2023 1028 Last data filed at 01/26/2023 0955 Gross per 24 hour  Intake 608.79 ml   Output 4100 ml  Net -3491.21 ml   Filed Weights   01/24/23 0302 01/25/23 0600 01/26/23 0359  Weight: (!) 168.4 kg (!) 165.9 kg (!) 167.1 kg    Scheduled Meds:  dapagliflozin propanediol  10 mg Oral QAC breakfast   insulin aspart  0-15 Units Subcutaneous TID WC   insulin aspart  0-5 Units Subcutaneous QHS   lactulose  30 g Oral TID   levothyroxine  88 mcg Oral QAC breakfast   pantoprazole  40 mg Oral Daily   rifaximin  550 mg Oral BID   senna-docusate  1 tablet Oral BID   sertraline  100 mg Oral Daily   sodium chloride flush  3 mL Intravenous Q12H   spironolactone  100 mg Oral Daily   traZODone  50 mg Oral QHS   Continuous Infusions:  furosemide (LASIX) 200 mg in dextrose 5 % 100 mL (2 mg/mL) infusion Stopped (01/26/23 0810)    Nutritional status     Body mass index is 47.3 kg/m.  Data Reviewed:   CBC: Recent Labs  Lab 01/22/23 0655 01/23/23 0354 01/24/23 0342 01/25/23 0446 01/26/23 0252  WBC 4.0 4.2 4.5 4.7 4.9  HGB 8.5* 8.4* 9.0* 9.1* 9.2*  HCT 27.0* 26.2* 27.9* 27.9* 28.3*  MCV 87.9 85.1 84.8 85.3 84.2  PLT 63* 62* 57* 66* 67*   Basic Metabolic Panel: Recent Labs  Lab 01/21/23 1705 01/22/23 0655 01/22/23 1724 01/23/23 0354 01/24/23 0342 01/24/23 1411 01/25/23 0446 01/25/23 1308 01/26/23 0252  NA  --    < > 135   < > 133* 134* 135 134* 132*  K  --    < > 4.2   < > 3.1* 3.4* 3.7 3.6 3.5  CL  --    < > 96*   < > 91* 90* 90* 90* 90*  CO2  --    < > 28   < > 28 33* 33* 32 34*  GLUCOSE  --    < > 132*   < > 139* 131* 134* 188* 167*  BUN  --    < > 35*   < > 36* 37* 37* 41* 42*  CREATININE  --    < > 2.27*   < > 2.57* 2.45* 2.49* 2.75* 2.89*  CALCIUM  --    < > 9.1   < > 9.1 9.3 9.0 9.3 8.9  MG 2.4  --   --   --   --   --  2.6*  --  2.7*  PHOS  --   --  4.1  --   --   --  5.6*  --   --    < > = values in this interval not displayed.   GFR: Estimated Creatinine Clearance: 42.4 mL/min (A) (by C-G formula based on SCr of 2.89 mg/dL (H)). Liver  Function Tests: Recent Labs  Lab 01/21/23 1705 01/22/23 0655 01/22/23 1724 01/23/23 0354 01/26/23 0252  AST 53* 57*  --  54* 52*  ALT 23 22  --  25 23  ALKPHOS 87 71  --  67 79  BILITOT 3.3* 3.7*  --  4.2* 3.4*  PROT 6.2* 6.2*  --  6.3* 6.4*  ALBUMIN 2.6* 2.6* 2.8* 2.6* 2.7*   No results for input(s): "LIPASE", "AMYLASE" in the last 168 hours. Recent Labs  Lab 01/22/23 1059  AMMONIA 60*   Coagulation Profile: Recent Labs  Lab 01/22/23 1724  INR 1.5*   Cardiac Enzymes: No results for input(s): "CKTOTAL", "CKMB", "CKMBINDEX", "TROPONINI" in the last 168 hours. BNP (last 3 results) No results for input(s): "PROBNP" in the last 8760 hours. HbA1C: No results for input(s): "HGBA1C" in the last 72 hours. CBG: Recent Labs  Lab 01/25/23 0605 01/25/23 1114 01/25/23 1600 01/25/23 2117 01/26/23 0612  GLUCAP 113* 148* 135* 143* 114*   Lipid Profile: No results for input(s): "CHOL", "HDL", "LDLCALC", "TRIG", "CHOLHDL", "LDLDIRECT" in the last 72 hours. Thyroid Function Tests: No results for input(s): "TSH", "T4TOTAL", "FREET4", "T3FREE", "THYROIDAB" in the last 72 hours. Anemia Panel: No results for input(s): "VITAMINB12", "FOLATE", "FERRITIN", "TIBC", "IRON", "RETICCTPCT" in the last 72 hours. Sepsis Labs: No results for input(s): "PROCALCITON", "LATICACIDVEN" in the last 168 hours.  No results found for this or any previous visit (from the past 240 hour(s)).       Radiology Studies: No results found.         LOS: 5 days   Time spent= 35 mins    Miguel Rota, MD [facility]  If 7PM-7AM, please contact night-coverage  01/26/2023, 10:28 AM

## 2023-01-26 NOTE — Progress Notes (Signed)
Mobility Specialist Progress Note:    01/26/23 1535  Mobility  Activity Ambulated with assistance in room  Level of Assistance Minimal assist, patient does 75% or more  Assistive Device Front wheel walker  Distance Ambulated (ft) 8 ft  Activity Response Tolerated well  Mobility Referral Yes  $Mobility charge 1 Mobility  Mobility Specialist Start Time (ACUTE ONLY) 1428  Mobility Specialist Stop Time (ACUTE ONLY) 1440  Mobility Specialist Time Calculation (min) (ACUTE ONLY) 12 min   Returned to pt to try to get to the recliner. Pt got out of right side of bed and attempted to ambulate around the bed to the chair but pt impulsively sat at EOB. Unable to ambulate further d/t pt repeating "I'm too tired". Returned to supine w/ call bell and personal belongings in reach. All needs met.   Thompson Grayer Mobility Specialist  Please contact vis Secure Chat or  Rehab Office [phone]

## 2023-01-27 DIAGNOSIS — N179 Acute kidney failure, unspecified: Secondary | ICD-10-CM | POA: Diagnosis not present

## 2023-01-27 DIAGNOSIS — I5033 Acute on chronic diastolic (congestive) heart failure: Secondary | ICD-10-CM | POA: Diagnosis not present

## 2023-01-27 DIAGNOSIS — N1832 Chronic kidney disease, stage 3b: Secondary | ICD-10-CM

## 2023-01-27 DIAGNOSIS — K7581 Nonalcoholic steatohepatitis (NASH): Secondary | ICD-10-CM | POA: Diagnosis not present

## 2023-01-27 DIAGNOSIS — I10 Essential (primary) hypertension: Secondary | ICD-10-CM | POA: Diagnosis not present

## 2023-01-27 LAB — GLUCOSE, CAPILLARY
Glucose-Capillary: 113 mg/dL — ABNORMAL HIGH (ref 70–99)
Glucose-Capillary: 116 mg/dL — ABNORMAL HIGH (ref 70–99)
Glucose-Capillary: 123 mg/dL — ABNORMAL HIGH (ref 70–99)
Glucose-Capillary: 110 mg/dL — ABNORMAL HIGH (ref 70–99)

## 2023-01-27 LAB — BASIC METABOLIC PANEL
Anion gap: 12 (ref 5–15)
BUN: 46 mg/dL — ABNORMAL HIGH (ref 8–23)
CO2: 33 mmol/L — ABNORMAL HIGH (ref 22–32)
Calcium: 9.3 mg/dL (ref 8.9–10.3)
Chloride: 90 mmol/L — ABNORMAL LOW (ref 98–111)
Creatinine, Ser: 2.84 mg/dL — ABNORMAL HIGH (ref 0.61–1.24)
GFR, Estimated: 24 mL/min — ABNORMAL LOW (ref >60)
Glucose, Bld: 129 mg/dL — ABNORMAL HIGH (ref 70–99)
Potassium: 3.3 mmol/L — ABNORMAL LOW (ref 3.5–5.1)
Sodium: 135 mmol/L (ref 135–145)

## 2023-01-27 LAB — CBC
HCT: 28.3 % — ABNORMAL LOW (ref 39.0–52.0)
Hemoglobin: 9.3 g/dL — ABNORMAL LOW (ref 13.0–17.0)
MCH: 28.1 pg (ref 26.0–34.0)
MCHC: 32.9 g/dL (ref 30.0–36.0)
MCV: 85.5 fL (ref 80.0–100.0)
Platelets: 68 10*3/uL — ABNORMAL LOW (ref 150–400)
RBC: 3.31 MIL/uL — ABNORMAL LOW (ref 4.22–5.81)
RDW: 16.1 % — ABNORMAL HIGH (ref 11.5–15.5)
WBC: 4.6 10*3/uL (ref 4.0–10.5)
nRBC: 0 % (ref 0.0–0.2)

## 2023-01-27 LAB — MAGNESIUM: Magnesium: 2.8 mg/dL — ABNORMAL HIGH (ref 1.7–2.4)

## 2023-01-27 MED ORDER — TRAMADOL HCL 50 MG PO TABS
50.0000 mg | ORAL_TABLET | Freq: Four times a day (QID) | ORAL | Status: DC | PRN
  Administered 2023-01-27: 50 mg via ORAL
  Filled 2023-01-27: qty 1

## 2023-01-27 MED ORDER — POTASSIUM CHLORIDE 20 MEQ PO PACK
20.0000 meq | PACK | Freq: Every day | ORAL | Status: DC
  Administered 2023-01-27: 20 meq via ORAL
  Filled 2023-01-27: qty 1

## 2023-01-27 MED ORDER — LACTULOSE 10 GM/15ML PO SOLN
30.0000 g | Freq: Two times a day (BID) | ORAL | Status: DC
  Administered 2023-01-27 – 2023-01-29 (×4): 30 g via ORAL
  Filled 2023-01-27 (×5): qty 45

## 2023-01-27 MED ORDER — POTASSIUM CHLORIDE CRYS ER 20 MEQ PO TBCR
40.0000 meq | EXTENDED_RELEASE_TABLET | Freq: Once | ORAL | Status: AC
  Administered 2023-01-27: 40 meq via ORAL
  Filled 2023-01-27: qty 2

## 2023-01-27 MED ORDER — GABAPENTIN 100 MG PO CAPS
100.0000 mg | ORAL_CAPSULE | Freq: Two times a day (BID) | ORAL | Status: DC
  Administered 2023-01-27 – 2023-01-30 (×7): 100 mg via ORAL
  Filled 2023-01-27 (×7): qty 1

## 2023-01-27 MED ORDER — TORSEMIDE 20 MG PO TABS
40.0000 mg | ORAL_TABLET | Freq: Two times a day (BID) | ORAL | Status: DC
  Administered 2023-01-27 – 2023-01-28 (×3): 40 mg via ORAL
  Filled 2023-01-27 (×3): qty 2

## 2023-01-27 MED ORDER — HYDROXYZINE PAMOATE 25 MG PO CAPS: 25.0000 mg | ORAL_CAPSULE | Freq: Three times a day (TID) | ORAL | Status: DC | PRN

## 2023-01-27 NOTE — Progress Notes (Signed)
Mobility Specialist Progress Note:    01/27/23 1055  Mobility  Activity Transferred from bed to chair  Level of Assistance +2 (takes two people) (ModA)  Location manager Ambulated (ft) 3 ft  Activity Response Tolerated poorly  Mobility Referral Yes  $Mobility charge 1 Mobility  Mobility Specialist Start Time (ACUTE ONLY) 0932  Mobility Specialist Stop Time (ACUTE ONLY) [id number]  Mobility Specialist Time Calculation (min) (ACUTE ONLY) 20 min   Pt received in bed very lethargic yet agreeable to mobilize. Pt needed Mod verbal cues to focus and get to EOB. Needed ModA +2 throughout whole session. Was able to transfer and take a couple steps towards the chair w/o fault. Situated in chair w/ call bell and personal belongings in reach. All needs met.  Thompson Grayer Mobility Specialist  Please contact vis Secure Chat or  Rehab Office [phone]

## 2023-01-27 NOTE — Assessment & Plan Note (Signed)
Continue glucose cover and monitoring with insulin sliding scale.  Fasting glucose is 139 mg.dl today.

## 2023-01-27 NOTE — Assessment & Plan Note (Signed)
Continue pantoprazole. °

## 2023-01-27 NOTE — Assessment & Plan Note (Signed)
Hypokalemia.   Renal function with serum cr at 2,84 with K at 3,3 and serum bicarbonate at 33.  Na 135 and Mg 2,8   Plan to continue K correction with Kcl, will plan for 60 meq today and follow up renal function and electrolytes in am.  Continue diuresis with oral loop diuretic therapy.

## 2023-01-27 NOTE — Assessment & Plan Note (Addendum)
Continue hydroxyzine (decrease dose to 25 mg) and sertraline.  Trazodone at night.

## 2023-01-27 NOTE — Assessment & Plan Note (Signed)
No clinical signs of exacerbation, continue with bronchodilator therapy.  ?

## 2023-01-27 NOTE — Assessment & Plan Note (Addendum)
Anemia of chronic disease.  Cell count today with hgb at 9,3 and Plt 68   Iron deficiency anemia.

## 2023-01-27 NOTE — Plan of Care (Signed)
  Problem: Education: Goal: Ability to verbalize understanding of medication therapies will improve Outcome: Progressing   Problem: Cardiac: Goal: Ability to achieve and maintain adequate cardiopulmonary perfusion will improve Outcome: Progressing   Problem: Coping: Goal: Ability to adjust to condition or change in health will improve Outcome: Progressing   Problem: Fluid Volume: Goal: Ability to maintain a balanced intake and output will improve Outcome: Progressing   Problem: Metabolic: Goal: Ability to maintain appropriate glucose levels will improve Outcome: Progressing   Problem: Nutritional: Goal: Progress toward achieving an optimal weight will improve Outcome: Progressing   Problem: Skin Integrity: Goal: Risk for impaired skin integrity will decrease Outcome: Progressing   Problem: Tissue Perfusion: Goal: Adequacy of tissue perfusion will improve Outcome: Progressing   Problem: Education: Goal: Knowledge of General Education information will improve Description: Including pain rating scale, medication(s)/side effects and non-pharmacologic comfort measures Outcome: Progressing   Problem: Clinical Measurements: Goal: Will remain free from infection Outcome: Progressing Goal: Diagnostic test results will improve Outcome: Progressing Goal: Respiratory complications will improve Outcome: Progressing Goal: Cardiovascular complication will be avoided Outcome: Progressing   Problem: Activity: Goal: Risk for activity intolerance will decrease Outcome: Progressing   Problem: Coping: Goal: Level of anxiety will decrease Outcome: Progressing   Problem: Elimination: Goal: Will not experience complications related to bowel motility Outcome: Progressing Goal: Will not experience complications related to urinary retention Outcome: Progressing   Problem: Pain Management: Goal: General experience of comfort will improve Outcome: Progressing   Problem:  Safety: Goal: Ability to remain free from injury will improve Outcome: Progressing

## 2023-01-27 NOTE — Assessment & Plan Note (Addendum)
Continue lactulose decrease to bid.  Continue with rifaximin.  No sings of decompensated liver failure.

## 2023-01-27 NOTE — Progress Notes (Signed)
Progress Note   Patient: Daniel Crawford LKG:[id number] DOB: [birth year] DOA: 01/21/2023     6 DOS: the patient was seen and examined on 01/27/2023   Brief hospital course: Daniel Crawford was admitted to the hospital with the working diagnosis of heart failure exacerbation.   62/M with history of cirrhosis, fatty liver disease, morbid obesity, diastolic CHF, CKD 3B/4, COPD, type 2 diabetes mellitus, hypothyroidism, hypertension, frequent hospitalizations with fluid overload, discharged from [facility] weeks ago after admission for same.  Returns back to the hospital in CHF exacerbation with significant weight gain.   Patient has been placed on IV furosemide continuous infusion.       Assessment and Plan: * Acute on chronic diastolic (congestive) heart failure (HCC) Echocardiogram with preserved LV systolic function with EF 55 to 60%, mild LVH, RV systolic function preserved, no significant valvular disease.   Urine output is 2,500 ml  Systolic blood pressure 97 to 664 mmHg.   Plan to continue diuresis with torsemide 40 mg bid.  Continue SGLT 2 inh and spironolactone.  Holding Ace inh or ARB due to risk of hypotension.  No B blocker due to risk of bradycardia.   Acute kidney injury superimposed on stage 3b chronic kidney disease (HCC) Hypokalemia.   Renal function with serum cr at 2,84 with K at 3,3 and serum bicarbonate at 33.  Na 135 and Mg 2,8   Plan to continue K correction with Kcl, will plan for 60 meq today and follow up renal function and electrolytes in am.  Continue diuresis with oral loop diuretic therapy.   Essential hypertension Continue blood pressure monitoring.  Volume status has improved.   Liver cirrhosis secondary to NASH (HCC) Continue lactulose decrease to bid.  Continue with rifaximin.  No sings of decompensated liver failure.   Thrombocytopenia (HCC) Anemia of chronic disease.  Cell count today with hgb at 9,3 and Plt 68   Iron deficiency anemia.    T2DM (type 2 diabetes mellitus) (HCC) Continue glucose cover and monitoring with insulin sliding scale.  Fasting glucose is 129 mg.dl today.   Acquired hypothyroidism Continue levothyroxine.   GERD (gastroesophageal reflux disease) Continue pantoprazole.   Depression with anxiety Continue hydroxyzine (decrease dose to 25 mg) and sertraline.  Trazodone at night.   COPD (chronic obstructive pulmonary disease) (HCC) No clinical signs of exacerbation, continue with bronchodilator therapy.   Obesity, class 3 Calculated BMI 45,8   Sleep apnea, continue Cpap.   Positive back pain, continue with gabapentin change from PRN to bid. Will change IV morphine to oral tramadol as needed.  PT and OT. Out of bed to chair tid with meals.         Subjective: Patient very deconditioned and weak, somnolent at the time of my visit. He complains of back pain, no dyspnea or chest pain, edema has been improving.   Physical Exam: Vitals:   01/27/23 0432 01/27/23 0433 01/27/23 0721 01/27/23 1050  BP: (!) 97/44  (!) 118/54 (!) 103/54  Pulse:   67 66  Resp:   19 19  Temp: 97.9 F (36.6 C)  98 F (36.7 C) 98 F (36.7 C)  TempSrc: Oral  Oral Oral  SpO2:   97% 99%  Weight:  (!) 161.8 kg    Height:       Neurology somnolent but easy to arouse, responds to simple questions and follows commands ENT with mild pallor, oral mucosa dry  Cardiovascular with S1 and S2 present and regular with no  gallops, rubs or murmurs No JVD Positive lower extremity edema, trace, with unna boots in place Respiratory with no rales or wheezing, no rhonchi Abdomen with no distention  Data Reviewed:    Family Communication: no family at the bedside   Disposition: Status is: Inpatient Remains inpatient appropriate because: patient Daniel Crawford need placement.   Planned Discharge Destination: Skilled nursing facility      Author: Coralie Keens, MD 01/27/2023 1:50 PM  For on call review  [email or web address].

## 2023-01-27 NOTE — Assessment & Plan Note (Addendum)
Calculated BMI 45,8   Sleep apnea, continue Cpap.   Positive back pain, continue with gabapentin change from PRN to bid. Will change IV morphine to oral tramadol as needed.  PT and OT. Out of bed to chair tid with meals.

## 2023-01-27 NOTE — Plan of Care (Signed)
  Problem: Education: Goal: Ability to demonstrate management of disease process will improve Outcome: Progressing Goal: Ability to verbalize understanding of medication therapies will improve Outcome: Progressing   Problem: Activity: Goal: Capacity to carry out activities will improve Outcome: Progressing   Problem: Education: Goal: Ability to describe self-care measures that Kisling prevent or decrease complications (Diabetes Survival Skills Education) will improve Outcome: Progressing   Problem: Education: Goal: Knowledge of General Education information will improve Description: Including pain rating scale, medication(s)/side effects and non-pharmacologic comfort measures Outcome: Progressing

## 2023-01-27 NOTE — Assessment & Plan Note (Signed)
Continue blood pressure monitoring.  Volume status has improved.

## 2023-01-27 NOTE — Assessment & Plan Note (Signed)
Continue levothyroxine 

## 2023-01-27 NOTE — Progress Notes (Signed)
Progress Note  Patient Name: Daniel Crawford Date of Encounter: 01/27/2023  Primary Cardiologist: Nona Dell, MD  Interval Summary   Still feels weak, but states that it is less than yesterday.  No chest pain or shortness of breath.  Vital Signs    Vitals:   01/26/23 2359 01/27/23 0432 01/27/23 0433 01/27/23 0721  BP: (!) 96/42 (!) 97/44  (!) 118/54  Pulse: 71   67  Resp: 17   19  Temp: 98.3 F (36.8 C) 97.9 F (36.6 C)  98 F (36.7 C)  TempSrc: Oral Oral  Oral  SpO2: 97%   97%  Weight:   (!) 161.8 kg   Height:        Intake/Output Summary (Last 24 hours) at 01/27/2023 0832 Last data filed at 01/27/2023 0435 Gross per 24 hour  Intake 360 ml  Output 1700 ml  Net -1340 ml   Filed Weights   01/25/23 0600 01/26/23 0359 01/27/23 0433  Weight: (!) 165.9 kg (!) 167.1 kg (!) 161.8 kg    Physical Exam   GEN: No acute distress.   Neck: Difficult to assess JVP. Cardiac: RRR without gallop.  Respiratory: Nonlabored. Clear to auscultation bilaterally. GI: Bowel sounds present. MS: Legs wrapped as before with chronic edema. Neuro:  Nonfocal. Psych: Alert and oriented x 3. Normal affect.  ECG/Telemetry    Telemetry reviewed showing sinus rhythm.  Labs    Chemistry Recent Labs  Lab 01/22/23 0655 01/22/23 1724 01/23/23 0354 01/24/23 0342 01/25/23 1308 01/26/23 0252 01/27/23 0303  NA 135 135 134*   < > 134* 132* 135  K 4.4 4.2 3.6   < > 3.6 3.5 3.3*  CL 100 96* 94*   < > 90* 90* 90*  CO2 25 28 25    < > 32 34* 33*  GLUCOSE 130* 132* 161*   < > 188* 167* 129*  BUN 35* 35* 37*   < > 41* 42* 46*  CREATININE 2.06* 2.27* 2.35*   < > 2.75* 2.89* 2.84*  CALCIUM 8.8* 9.1 8.9   < > 9.3 8.9 9.3  PROT 6.2*  --  6.3*  --   --  6.4*  --   ALBUMIN 2.6* 2.8* 2.6*  --   --  2.7*  --   AST 57*  --  54*  --   --  52*  --   ALT 22  --  25  --   --  23  --   ALKPHOS 71  --  67  --   --  79  --   BILITOT 3.7*  --  4.2*  --   --  3.4*  --   GFRNONAA 35* 31* 30*   < > 25* 24*  24*  ANIONGAP 10 11 15    < > 12 8 12    < > = values in this interval not displayed.    Hematology Recent Labs  Lab 01/25/23 0446 01/26/23 0252 01/27/23 0303  WBC 4.7 4.9 4.6  RBC 3.27* 3.36* 3.31*  HGB 9.1* 9.2* 9.3*  HCT 27.9* 28.3* 28.3*  MCV 85.3 84.2 85.5  MCH 27.8 27.4 28.1  MCHC 32.6 32.5 32.9  RDW 15.7* 15.9* 16.1*  PLT 66* 67* 68*   Cardiac Enzymes Recent Labs  Lab 01/21/23 1359 01/21/23 1705  TROPONINIHS 10 12    Cardiac Studies   Echocardiogram 01/23/2023:  1. Left ventricular ejection fraction, by estimation, is 55 to 60%. The  left ventricle has normal function. The left  ventricle has no regional  wall motion abnormalities. There is mild concentric left ventricular  hypertrophy. Left ventricular diastolic  parameters were normal.   2. Right ventricular systolic function is normal. The right ventricular  size is normal. Tricuspid regurgitation signal is inadequate for assessing  PA pressure.   3. The mitral valve is grossly normal. No evidence of mitral valve  regurgitation. No evidence of mitral stenosis.   4. The aortic valve is tricuspid. Aortic valve regurgitation is not  visualized. No aortic stenosis is present.   5. Aortic dilatation noted. There is mild dilatation of the aortic root,  measuring 43 mm. There is mild dilatation of the ascending aorta,  measuring 42 mm.   Assessment & Plan   1.  Acute on chronic HFpEF, LVEF 55 to 60% with normal RV contraction by recent echocardiogram.  Has had substantial diuresis and still with evidence of general fluid overload.  2.  Stage IV NASH cirrhosis.  Also complicates fluid overload.  Remains on lactulose.  3.  AKI with CKD stage IIIb complicated by diuresis.  Creatinine starting to trend down at 2.84.  4.  Hypothyroidism, TSH 1.183 on Synthroid.  He has diuresed just over 20 L overall.  Current weight listed at 161 kg which is below his weight listed at heart failure clinic visit in mid October.   Plan to continue Farxiga and Aldactone at prior doses.  Start back on Demadex and titrate as tolerated.  Add low-dose potassium supplement.  Might benefit from PT.  For questions or updates, please contact [facility] Please consult [email or web address] for contact info under   Signed, Nona Dell, MD  01/27/2023, 8:32 AM

## 2023-01-27 NOTE — Assessment & Plan Note (Addendum)
Echocardiogram with preserved LV systolic function with EF 55 to 60%, mild LVH, RV systolic function preserved, no significant valvular disease.   Urine output is 2,850 ml  Systolic blood pressure 106 to 113 mmHg.   Plan to increase dose of diuresis with torsemide to 80 mg bid  Continue SGLT 2 inh and spironolactone.  Holding Ace inh or ARB due to risk of hypotension.  No B blocker due to risk of bradycardia.

## 2023-01-28 DIAGNOSIS — N179 Acute kidney failure, unspecified: Secondary | ICD-10-CM | POA: Diagnosis not present

## 2023-01-28 DIAGNOSIS — I5033 Acute on chronic diastolic (congestive) heart failure: Secondary | ICD-10-CM | POA: Diagnosis not present

## 2023-01-28 DIAGNOSIS — I10 Essential (primary) hypertension: Secondary | ICD-10-CM | POA: Diagnosis not present

## 2023-01-28 DIAGNOSIS — K7581 Nonalcoholic steatohepatitis (NASH): Secondary | ICD-10-CM | POA: Diagnosis not present

## 2023-01-28 LAB — GLUCOSE, CAPILLARY
Glucose-Capillary: 122 mg/dL — ABNORMAL HIGH (ref 70–99)
Glucose-Capillary: 141 mg/dL — ABNORMAL HIGH (ref 70–99)
Glucose-Capillary: 112 mg/dL — ABNORMAL HIGH (ref 70–99)
Glucose-Capillary: 120 mg/dL — ABNORMAL HIGH (ref 70–99)

## 2023-01-28 LAB — BASIC METABOLIC PANEL
Anion gap: 9 (ref 5–15)
BUN: 41 mg/dL — ABNORMAL HIGH (ref 8–23)
CO2: 33 mmol/L — ABNORMAL HIGH (ref 22–32)
Calcium: 9.4 mg/dL (ref 8.9–10.3)
Chloride: 93 mmol/L — ABNORMAL LOW (ref 98–111)
Creatinine, Ser: 2.6 mg/dL — ABNORMAL HIGH (ref 0.61–1.24)
GFR, Estimated: 27 mL/min — ABNORMAL LOW (ref >60)
Glucose, Bld: 135 mg/dL — ABNORMAL HIGH (ref 70–99)
Potassium: 3.8 mmol/L (ref 3.5–5.1)
Sodium: 135 mmol/L (ref 135–145)

## 2023-01-28 LAB — MAGNESIUM: Magnesium: 3 mg/dL — ABNORMAL HIGH (ref 1.7–2.4)

## 2023-01-28 MED ORDER — SODIUM CHLORIDE 0.9% FLUSH
10.0000 mL | Freq: Two times a day (BID) | INTRAVENOUS | Status: DC
  Administered 2023-01-28 – 2023-01-29 (×2): 10 mL via INTRAVENOUS

## 2023-01-28 MED ORDER — POTASSIUM CHLORIDE CRYS ER 20 MEQ PO TBCR
40.0000 meq | EXTENDED_RELEASE_TABLET | Freq: Once | ORAL | Status: AC
  Administered 2023-01-28: 40 meq via ORAL
  Filled 2023-01-28: qty 4

## 2023-01-28 MED ORDER — PROCHLORPERAZINE EDISYLATE 10 MG/2ML IJ SOLN
5.0000 mg | Freq: Once | INTRAMUSCULAR | Status: AC
  Administered 2023-01-28: 5 mg via INTRAVENOUS
  Filled 2023-01-28: qty 2

## 2023-01-28 MED ORDER — TORSEMIDE 20 MG PO TABS
80.0000 mg | ORAL_TABLET | Freq: Two times a day (BID) | ORAL | Status: DC
  Administered 2023-01-28 – 2023-01-30 (×4): 80 mg via ORAL
  Filled 2023-01-28 (×4): qty 4

## 2023-01-28 NOTE — Progress Notes (Signed)
Orthopedic Tech Progress Note Patient Details:  Daniel Crawford [birth year] [id number]  Ortho Devices Type of Ortho Device: Radio broadcast assistant Ortho Device/Splint Location: BLE Ortho Device/Splint Interventions: Application   Post Interventions Patient Tolerated: Well Instructions Provided: Care of device, Adjustment of device  [name] [name] 01/28/2023, 9:12 AM

## 2023-01-28 NOTE — TOC Progression Note (Signed)
Transition of Care [facility]) - Progression Note    Patient Details  Name: Daniel Crawford MRN: [id number] Date of Birth: [birth year]  Transition of Care [facility]) CM/SW Contact  Nicanor Bake Phone Number: [phone] 01/28/2023, 3:40 PM  Clinical Narrative: HF CSW called and spoke with pt over the phone. CSW explained that PT made the recommendation for pt to go to a SNF at dc. Pt agrees. CSW explained the process to pt and received permission to fax out SNFs offers pending. CSW completed pts FL2.   TOC will continue following.            Expected Discharge Plan and Services                                               Social Determinants of Health (SDOH) Interventions SDOH Screenings   Food Insecurity: No Food Insecurity (01/21/2023)  Housing: Low Risk  (01/21/2023)  Transportation Needs: No Transportation Needs (01/21/2023)  Utilities: Not At Risk (01/21/2023)  Depression (PHQ2-9): Medium Risk (10/27/2019)  Financial Resource Strain: Low Risk  (03/28/2021)   Received from [facility], [facility]  Tobacco Use: Medium Risk (01/21/2023)    Readmission Risk Interventions    12/21/2022    9:17 AM 10/20/2022    2:15 PM 10/04/2022    9:36 AM  Readmission Risk Prevention Plan  Transportation Screening Complete Complete Complete  Medication Review Oceanographer) Complete Complete Complete  PCP or Specialist appointment within 3-5 days of discharge  Not Complete   HRI or Home Care Consult Complete Complete Complete  SW Recovery Care/Counseling Consult Complete Complete Complete  Palliative Care Screening Not Applicable Not Applicable Not Applicable  Skilled Nursing Facility Not Applicable Not Applicable Not Applicable

## 2023-01-28 NOTE — TOC Progression Note (Signed)
Transition of Care [facility]) - Progression Note    Patient Details  Name: Daniel Crawford MRN: [id number] Date of Birth: Nov 01, 1955  Transition of Care [facility]) CM/SW Contact  Nicanor Bake Phone Number: [phone] 01/28/2023, 1:49 PM  Clinical Narrative:   HF CSW attempted to meet with pt at bedside. Pt was being seen by doctor. CSW will follow up with pt at a later time.   TOC will continue following.          Expected Discharge Plan and Services                                               Social Determinants of Health (SDOH) Interventions SDOH Screenings   Food Insecurity: No Food Insecurity (01/21/2023)  Housing: Low Risk  (01/21/2023)  Transportation Needs: No Transportation Needs (01/21/2023)  Utilities: Not At Risk (01/21/2023)  Depression (PHQ2-9): Medium Risk (10/27/2019)  Financial Resource Strain: Low Risk  (03/28/2021)   Received from [facility], [facility]  Tobacco Use: Medium Risk (01/21/2023)    Readmission Risk Interventions    12/21/2022    9:17 AM 10/20/2022    2:15 PM 10/04/2022    9:36 AM  Readmission Risk Prevention Plan  Transportation Screening Complete Complete Complete  Medication Review Oceanographer) Complete Complete Complete  PCP or Specialist appointment within 3-5 days of discharge  Not Complete   HRI or Home Care Consult Complete Complete Complete  SW Recovery Care/Counseling Consult Complete Complete Complete  Palliative Care Screening Not Applicable Not Applicable Not Applicable  Skilled Nursing Facility Not Applicable Not Applicable Not Applicable

## 2023-01-28 NOTE — Progress Notes (Addendum)
Advanced Heart Failure Rounding Note  PCP-Cardiologist: Nona Dell, MD   Subjective:    Echo 01/23/23: EF 55-60%,  mild concentric LVH, normal RV sCr 2.6 from 2.84 yesterday.   Diuresed 2.8L with oral diuresis. Continues to have soft BP overnight, MAP 70 this morning. No dizziness. Mild SOB. No CP  Objective:   Weight Range: (!) 157.3 kg Body mass index is 44.52 kg/m.   Vital Signs:   Temp:  [97.9 F (36.6 C)-98.3 F (36.8 C)] 98 F (36.7 C) (11/04 0722) Pulse Rate:  [64-72] 72 (11/04 0722) Resp:  [16-19] 18 (11/04 0722) BP: (96-112)/(45-54) 96/46 (11/04 0722) SpO2:  [93 %-99 %] 99 % (11/04 0722) Weight:  [157.3 kg] 157.3 kg (11/04 0445) Last BM Date : 01/25/23  Weight change: Filed Weights   01/26/23 0359 01/27/23 0433 01/28/23 0445  Weight: (!) 167.1 kg (!) 161.8 kg (!) 157.3 kg    Intake/Output:   Intake/Output Summary (Last 24 hours) at 01/28/2023 0802 Last data filed at 01/28/2023 0555 Gross per 24 hour  Intake 120 ml  Output 2850 ml  Net -2730 ml     Physical Exam   General:  Fatigued appearing. Sitting up in chair. HEENT: normal Neck: supple. Thick neck. JVP ~8. No lymphadenopathy or thryomegaly appreciated. Cor: PMI nondisplaced. Regular rate & rhythm. No rubs, gallops or murmurs. Heart sounds distant Lungs: clear Abdomen: obese, soft, nontender, nondistended.  Extremities: no cyanosis, clubbing, rash, 2-3+ edema to thighs and BLE Neuro: alert & oriented x 3. Affect pleasant  Telemetry   SR 70s  Labs    CBC Recent Labs    01/26/23 0252 01/27/23 0303  WBC 4.9 4.6  HGB 9.2* 9.3*  HCT 28.3* 28.3*  MCV 84.2 85.5  PLT 67* 68*   Basic Metabolic Panel Recent Labs    16/10/96 0303 01/28/23 0301  NA 135 135  K 3.3* 3.8  CL 90* 93*  CO2 33* 33*  GLUCOSE 129* 135*  BUN 46* 41*  CREATININE 2.84* 2.60*  CALCIUM 9.3 9.4  MG 2.8* 3.0*   Liver Function Tests Recent Labs    01/26/23 0252  AST 52*  ALT 23  ALKPHOS 79   BILITOT 3.4*  PROT 6.4*  ALBUMIN 2.7*   BNP: BNP (last 3 results) Recent Labs    12/20/22 1418 01/10/23 1101 01/21/23 1359  BNP 56.0 47.0 31.7    Imaging    No results found.   Medications:     Scheduled Medications:  dapagliflozin propanediol  10 mg Oral QAC breakfast   gabapentin  100 mg Oral BID   insulin aspart  0-15 Units Subcutaneous TID WC   insulin aspart  0-5 Units Subcutaneous QHS   lactulose  30 g Oral BID   levothyroxine  88 mcg Oral QAC breakfast   pantoprazole  40 mg Oral Daily   rifaximin  550 mg Oral BID   senna-docusate  1 tablet Oral BID   sertraline  100 mg Oral Daily   sodium chloride flush  3 mL Intravenous Q12H   spironolactone  100 mg Oral Daily   torsemide  40 mg Oral BID   traZODone  50 mg Oral QHS   PRN Medications: acetaminophen **OR** acetaminophen, albuterol, guaiFENesin, hydrALAZINE, hydrOXYzine, ondansetron (ZOFRAN) IV, polyethylene glycol, traMADol  Patient Profile   [name] is a 67 year old with a history of cirrhosis (liver bx 2023 Stage IV cirrhosis), NASH, Hypothyroidism, HTN, DMII, CKD StageIII, COPD, and HFpEF. Admitted with A/C HFpEF.  Assessment/Plan   1. A/C HFpEF - Most recent Echo 06/2022 LVEF 55% with normal RV function.  - Eight admits over the last year with decompensated heart failure/anasarca and hepatic encephalopathy. Concern for compliance with home medications and fluid intake.  - Echo 01/23/23: EF 55-60%, no RWMA, mild concentric LVH, normal RV, no MR, trivial TR - Transitioned from Lasix gtt to Torsemide PO 40 BID yesterday. Increase to home dose 80mg  BID today. Weight continues to trend down. 157.3kg this morning. - Spironolactone 100 mg daily  - Will hold Coreg d/t soft BP. No clear need for BB with HFpEF. - Continue Farxiga 10 mg daily - UNNA boots removed. Consider adding back this afternoon. - Follow renal function closely.    2. Cirrhosis, NASH - Followed by GI in [place]. Stage IV cirrhosis,  had liver biopsy 2023.  - On lactulose and rifaximin.  - Ammionia 60 10/29 - Continue lactulose per primary team.    3. CKD Stage IIIb - Creatinine baseline previously low 2s - Cr on admit 1.96. Peaked at 2.9. Now 2.6 today. - Diuresis as above. CTM - Denies recent NSAIDs   4. DMII  - SSI + at bedtime insulin per primary   5. Anemia  -Hgb 9.3 (chronically 8s-9s). No obvious source of bleeding.  -Iron stores low. Received IV iron.   6. GOC  -Code status changed to DNR/DNI. Agree, long-term prognosis is poor. -Appreciate palliative care input  Length of Stay: 7  [place] Lee, NP  01/28/2023, 8:02 AM  Advanced Heart Failure Team Pager [phone] (M-F; 7a - 5p)  Please contact [facility] for night-coverage after hours (5p -7a ) and weekends on [email or web address]   Patient seen with NP, agree with the above note.   He has diuresed a total of about 43 lbs now.  Still feels swollen.  Creatinine lower at 2.6.   General: Obese.  Neck: JVP ?8, no thyromegaly or thyroid nodule.  Lungs: Clear to auscultation bilaterally with normal respiratory effort. CV: Nondisplaced PMI.  Heart regular S1/S2, no S3/S4, no murmur.  Legs wrapped, 1+ edema to knees. .  Abdomen: Soft, nontender, no hepatosplenomegaly, no distention.  Skin: Intact without lesions or rashes.  Neurologic: Alert and oriented x 3.  Psych: Normal affect. Extremities: No clubbing or cyanosis.  HEENT: Normal.    Exam difficult for volume, has diuresed significantly this admission.  Creatinine lower today at 2.6.   - Agree with torsemide 80 mg bid today.  - Continue spironolactone 100 mg daily and dapagliflozin 10 mg daily.  - I am going to arrange for RHC tomorrow to assess right and left-sided filling pressures to see if we have him fully diuresed.  Exam is somewhat difficult. Would like him as completely diuresed as possible before discharge.  Discussed risks/benefits and he agrees to procedure.   Marca Ancona 01/28/2023 1:37  PM

## 2023-01-28 NOTE — Progress Notes (Signed)
Physical Therapy Treatment Patient Details Name: Daniel Crawford MRN: [id number] DOB: 11/10/1955 Today's Date: 01/28/2023   History of Present Illness 67 y/o male presents [facility] 01/21/23 w/ CHF exacerbation w/ significant weight gain. Prior admit from [facility] 4 weeks prior for same episode. PMHx: cirrhosis, fatty liver disease, morbid obesity, diastolic CHF, CKD 3B/4, COPD, type 2 diabetes mellitus, hypothyroidism, hypertension, frequent hospitalizations with fluid overload    PT Comments  Pt with decline in mobility over the past week along with cognitive impairment. Do not feel he can manage at home on his own. Patient will benefit from continued inpatient follow up therapy, <3 hours/day     If plan is discharge home, recommend the following: A little help with walking and/or transfers;A little help with bathing/dressing/bathroom;Assistance with cooking/housework;Assist for transportation;Help with stairs or ramp for entrance;Supervision due to cognitive status   Can travel by private vehicle        Equipment Recommendations  None recommended by PT    Recommendations for Other Services       Precautions / Restrictions Precautions Precautions: Fall Restrictions Weight Bearing Restrictions: No     Mobility  Bed Mobility Overal bed mobility: Needs Assistance Bed Mobility: Supine to Sit     Supine to sit: Min assist     General bed mobility comments: Assist to elevate trunk into sitting and bring hips to EOB    Transfers Overall transfer level: Needs assistance Equipment used: Rolling walker (2 wheels) Transfers: Sit to/from Stand, Bed to chair/wheelchair/BSC Sit to Stand: +2 physical assistance, Min assist   Step pivot transfers: +2 physical assistance, Min assist       General transfer comment: Assist to power up. Verbal/tactile cues for hand placement. Pt with delay in initiating stand to command. 1-2 second delay before powering up. Bed to chair with shuffling steps.     Ambulation/Gait Ambulation/Gait assistance: Min assist, +2 safety/equipment Gait Distance (Feet): 6 Feet Assistive device: Rolling walker (2 wheels) Gait Pattern/deviations: Step-through pattern, Decreased step length - right, Decreased step length - left, Shuffle, Trunk flexed Gait velocity: decr Gait velocity interpretation: <1.31 ft/sec, indicative of household ambulator   General Gait Details: Assist for balance and support. Second person for chair follow. Verbal encouragement that he could walk.   Stairs             Wheelchair Mobility     Tilt Bed    Modified Rankin (Stroke Patients Only)       Balance Overall balance assessment: Needs assistance Sitting-balance support: Feet supported, No upper extremity supported Sitting balance-Leahy Scale: Fair     Standing balance support: Bilateral upper extremity supported, During functional activity, Reliant on assistive device for balance Standing balance-Leahy Scale: Poor Standing balance comment: Walker and min assist for static standing                            Cognition Arousal: Alert Behavior During Therapy: WFL for tasks assessed/performed Overall Cognitive Status: Impaired/Different from baseline Area of Impairment: Attention, Memory, Following commands, Safety/judgement, Awareness, Problem solving                   Current Attention Level: Selective Memory: Decreased short-term memory Following Commands: Follows one step commands consistently, Follows one step commands with increased time Safety/Judgement: Decreased awareness of deficits Awareness: Intellectual Problem Solving: Slow processing, Decreased initiation, Difficulty sequencing, Requires verbal cues, Requires tactile cues  Exercises      General Comments General comments (skin integrity, edema, etc.): VSS on RA      Pertinent Vitals/Pain      Home Living                          Prior  Function            PT Goals (current goals can now be found in the care plan section) Acute Rehab PT Goals Patient Stated Goal: to go home Progress towards PT goals: Progressing toward goals    Frequency    Min 1X/week      PT Plan      Co-evaluation              AM-PAC PT "6 Clicks" Mobility   Outcome Measure  Help needed turning from your back to your side while in a flat bed without using bedrails?: A Little Help needed moving from lying on your back to sitting on the side of a flat bed without using bedrails?: A Little Help needed moving to and from a bed to a chair (including a wheelchair)?: Total Help needed standing up from a chair using your arms (e.g., wheelchair or bedside chair)?: Total Help needed to walk in hospital room?: Total Help needed climbing 3-5 steps with a railing? : Total 6 Click Score: 10    End of Session Equipment Utilized During Treatment: Gait belt Activity Tolerance: Patient limited by fatigue Patient left: in chair;with chair alarm set;with call bell/phone within reach Nurse Communication: Mobility status PT Visit Diagnosis: Unsteadiness on feet (R26.81);Muscle weakness (generalized) (M62.81)     Time: 4132-4401 PT Time Calculation (min) (ACUTE ONLY): 23 min  Charges:    $Gait Training: 8-22 mins $Therapeutic Activity: 8-22 mins PT General Charges $$ ACUTE PT VISIT: 1 Visit                     [facility] PT Acute Rehabilitation Services Office [phone]    Angelina Ok [facility] 01/28/2023, 2:51 PM

## 2023-01-28 NOTE — Progress Notes (Signed)
Progress Note   Patient: Daniel Crawford ZOX:[id number] DOB: 01/19/1956 DOA: 01/21/2023     7 DOS: the patient was seen and examined on 01/28/2023   Brief hospital course: Mr. Feijoo was admitted to the hospital with the working diagnosis of heart failure exacerbation.   62/M with history of cirrhosis, fatty liver disease, morbid obesity, diastolic CHF, CKD 3B/4, COPD, type 2 diabetes mellitus, hypothyroidism, hypertension, frequent hospitalizations with fluid overload, discharged from [facility] 4 weeks ago after admission for same.  Returns back to the hospital in CHF exacerbation with significant weight gain.   Patient has been placed on IV furosemide continuous infusion.       Assessment and Plan: * Acute on chronic diastolic (congestive) heart failure (HCC) Echocardiogram with preserved LV systolic function with EF 55 to 60%, mild LVH, RV systolic function preserved, no significant valvular disease.   Urine output is 2,850 ml  Systolic blood pressure 106 to 113 mmHg.   Plan to increase dose of diuresis with torsemide to 80 mg bid  Continue SGLT 2 inh and spironolactone.  Holding Ace inh or ARB due to risk of hypotension.  No B blocker due to risk of bradycardia.   Acute kidney injury superimposed on stage 3b chronic kidney disease (HCC) Hypokalemia.   Renal function today with serum cr at 2,6 with K at 3,8 and serum bicarbonate at 33.  Na 135 Mg 3.0   Add 40 kcl to prevent hypokalemia Follow up renal function and electrolytes in am.   Essential hypertension Continue blood pressure monitoring.  Volume status has improved.   Liver cirrhosis secondary to NASH (HCC) Continue lactulose decrease to bid.  Continue with rifaximin.  No sings of decompensated liver failure.   Thrombocytopenia (HCC) Anemia of chronic disease.  Cell count today with hgb at 9,3 and Plt 68   Iron deficiency anemia.   T2DM (type 2 diabetes mellitus) (HCC) Continue glucose cover and monitoring  with insulin sliding scale.  Fasting glucose is 135 mg.dl today.   Acquired hypothyroidism Continue levothyroxine.   GERD (gastroesophageal reflux disease) Continue pantoprazole.   Depression with anxiety Continue hydroxyzine (decrease dose to 25 mg) and sertraline.  Trazodone at night.   COPD (chronic obstructive pulmonary disease) (HCC) No clinical signs of exacerbation, continue with bronchodilator therapy.   Obesity, class 3 Calculated BMI 45,8   Sleep apnea, continue Cpap.   Positive back pain, continue with gabapentin bid and PRN tramadol.  PT and OT. Out of bed to chair tid with meals.         Subjective: Patient feeling better than yesterday, but continue very weak and deconditioned, his dyspnea and edema are improving. No chest pain, back pain is better controlled today.   Physical Exam: Vitals:   01/28/23 0553 01/28/23 0722 01/28/23 0810 01/28/23 1058  BP: (!) 99/45 (!) 96/46 (!) 106/58 (!) 107/56  Pulse: 71 72  71  Resp: 16 18  20   Temp: 98.3 F (36.8 C) 98 F (36.7 C)  98.3 F (36.8 C)  TempSrc: Oral Oral  Oral  SpO2: 93% 99%  97%  Weight:      Height:       Neurology awake and alert ENT With mild pallor Cardiovascular with S1 and S2 present and regular with no gallops, rubs or murmurs Mild JVD Trace lower extremity edema, unna boots in place Respiratory with mild rales at bases with no wheezing or rhonchi Abdomen with no distention  Data Reviewed:    Family Communication:  no family at the bedside   Disposition: Status is: Inpatient Remains inpatient appropriate because: possible discharge home in 48 hrs   Planned Discharge Destination: Home     Author: Coralie Keens, MD 01/28/2023 3:56 PM  For on call review [email or web address].

## 2023-01-28 NOTE — Plan of Care (Signed)
  Problem: Education: Goal: Ability to demonstrate management of disease process will improve Outcome: Not Progressing   Problem: Nutrition: Goal: Adequate nutrition will be maintained Outcome: Not Progressing   Problem: Pain Management: Goal: General experience of comfort will improve Outcome: Progressing   Problem: Safety: Goal: Ability to remain free from injury will improve Outcome: Progressing

## 2023-01-28 NOTE — [place] FL2 (Signed)
[place] MEDICAID FL2 LEVEL OF CARE FORM     IDENTIFICATION  Patient Name: Daniel Crawford Birthdate: [birth year] Sex: male Admission Date (Current Location): 01/21/2023  [address] [facility] and [place] Number:  Producer, television/film/video and Address:  [facility], [address] [place], [place] [address]      Provider Number: [id number]  Attending Physician Name and Address:  Coralie Keens  Relative Name and Phone Number:       Current Level of Care: Hospital Recommended Level of Care: Skilled Nursing Facility Prior Approval Number:    Date Approved/Denied:   PASRR Number: [id number]  Discharge Plan: SNF    Current Diagnoses: Patient Active Problem List   Diagnosis Date Noted   Acute kidney injury superimposed on stage 3b chronic kidney disease (HCC) 01/27/2023   COPD (chronic obstructive pulmonary disease) (HCC) 01/21/2023   Acute on chronic diastolic (congestive) heart failure (HCC) 01/21/2023   Cellulitis and abscess of left leg 10/23/2022   Chronic hyponatremia 10/21/2022   T2DM (type 2 diabetes mellitus) (HCC) 07/06/2022   CKD stage 3a, GFR 45-59 ml/min (HCC) 07/06/2022   Umbilical hernia 07/05/2022   Obesity, class 3 05/25/2022   Liver cirrhosis secondary to NASH (HCC) 03/13/2022   Iron deficiency anemia 02/28/2022   GERD (gastroesophageal reflux disease) 02/28/2022   Generalized weakness 01/23/2022   Essential hypertension 01/23/2022   Volume overload 01/23/2022   Elevated TSH 01/22/2022   Protein calorie malnutrition (HCC) 01/22/2022   Chronic diastolic heart failure (HCC)    Hypogonadism, male 12/26/2021   History of colonic polyps 07/27/2021   Auditory hallucinations 07/27/2021   Cellulitis of left leg 03/26/2021   OSA (obstructive sleep apnea) 01/20/2020   History of ascites 10/22/2019   Acquired hypothyroidism 10/16/2016   Depression with anxiety 10/16/2016   Thrombocytopenia (HCC) 10/16/2016   Spleen enlarged 10/16/2016   Class  3 severe obesity in adult (HCC) 02/28/2016    Orientation RESPIRATION BLADDER Height & Weight     Self  Normal Incontinent Weight: (!) 346 lb 12.5 oz (157.3 kg) Height:  6\' 2"  (188 cm)  BEHAVIORAL SYMPTOMS/MOOD NEUROLOGICAL BOWEL NUTRITION STATUS      Incontinent Diet (See dc summary)  AMBULATORY STATUS COMMUNICATION OF NEEDS Skin   Limited Assist Verbally Normal                       Personal Care Assistance Level of Assistance  Bathing, Feeding, Dressing, Total care Bathing Assistance: Limited assistance Feeding assistance: Independent Dressing Assistance: Limited assistance Total Care Assistance: Limited assistance   Functional Limitations Info  Sight, Hearing, Speech     Speech Info: Adequate    SPECIAL CARE FACTORS FREQUENCY  PT (By licensed PT), OT (By licensed OT)     PT Frequency: 5X weekly OT Frequency: 5X weekly            Contractures      Additional Factors Info  Code Status, Allergies Code Status Info: DNR- limited Allergies Info: No known allergies           Current Medications (01/28/2023):  This is the current hospital active medication list Current Facility-Administered Medications  Medication Dose Route Frequency Provider Last Rate Last Admin   acetaminophen (TYLENOL) tablet 650 mg  650 mg Oral Q6H PRN Synetta Fail, MD   650 mg at 01/26/23 2059   Or   acetaminophen (TYLENOL) suppository 650 mg  650 mg Rectal Q6H PRN Synetta Fail, MD  albuterol (PROVENTIL) (2.5 MG/3ML) 0.083% nebulizer solution 3 mL  3 mL Nebulization Q6H PRN Synetta Fail, MD       dapagliflozin propanediol (FARXIGA) tablet 10 mg  10 mg Oral QAC breakfast Synetta Fail, MD   10 mg at 01/28/23 0750   gabapentin (NEURONTIN) capsule 100 mg  100 mg Oral BID Coralie Keens, MD   100 mg at 01/28/23 1109   guaiFENesin (ROBITUSSIN) 100 MG/5ML liquid 5 mL  5 mL Oral Q4H PRN Amin, Ankit C, MD       hydrALAZINE (APRESOLINE) injection 10 mg   10 mg Intravenous Q4H PRN Amin, Ankit C, MD       hydrOXYzine (VISTARIL) capsule 25 mg  25 mg Oral TID PRN Arrien, York Ram, MD       insulin aspart (novoLOG) injection 0-15 Units  0-15 Units Subcutaneous TID WC Synetta Fail, MD   2 Units at 01/28/23 0618   insulin aspart (novoLOG) injection 0-5 Units  0-5 Units Subcutaneous QHS Synetta Fail, MD       lactulose (CHRONULAC) 10 GM/15ML solution 30 g  30 g Oral BID Coralie Keens, MD   30 g at 01/28/23 1109   levothyroxine (SYNTHROID) tablet 88 mcg  88 mcg Oral QAC breakfast Synetta Fail, MD   88 mcg at 01/28/23 0617   ondansetron (ZOFRAN) injection 4 mg  4 mg Intravenous Q6H PRN Zannie Cove, MD   4 mg at 01/28/23 0738   pantoprazole (PROTONIX) EC tablet 40 mg  40 mg Oral Daily Synetta Fail, MD   40 mg at 01/28/23 1109   polyethylene glycol (MIRALAX / GLYCOLAX) packet 17 g  17 g Oral Daily PRN Haskel Khan, NP       rifaximin Burman Blacksmith) tablet 550 mg  550 mg Oral BID Synetta Fail, MD   550 mg at 01/28/23 1109   senna-docusate (Senokot-S) tablet 1 tablet  1 tablet Oral BID Haskel Khan, NP   1 tablet at 01/28/23 1110   sertraline (ZOLOFT) tablet 100 mg  100 mg Oral Daily Synetta Fail, MD   100 mg at 01/28/23 1110   sodium chloride flush (NS) 0.9 % injection 3 mL  3 mL Intravenous Q12H Synetta Fail, MD   3 mL at 01/28/23 1112   spironolactone (ALDACTONE) tablet 100 mg  100 mg Oral Daily Synetta Fail, MD   100 mg at 01/28/23 1109   torsemide (DEMADEX) tablet 80 mg  80 mg Oral BID Lee, [place], NP       traMADol Janean Sark) tablet 50 mg  50 mg Oral Q6H PRN Arrien, York Ram, MD   50 mg at 01/27/23 2152   traZODone (DESYREL) tablet 50 mg  50 mg Oral QHS Synetta Fail, MD   50 mg at 01/26/23 2059     Discharge Medications: Please see discharge summary for a list of discharge medications.  Relevant Imaging Results:  Relevant Lab Results:   Additional  Information SS: [id number]  Reva Bores, LCSWA

## 2023-01-29 ENCOUNTER — Encounter (HOSPITAL_COMMUNITY): Payer: Self-pay | Admitting: Cardiology

## 2023-01-29 ENCOUNTER — Encounter (HOSPITAL_COMMUNITY)
Admission: EM | Disposition: A | Discharge status: Home Health | Payer: Self-pay | Source: Ambulatory Visit | Attending: Internal Medicine

## 2023-01-29 DIAGNOSIS — I10 Essential (primary) hypertension: Secondary | ICD-10-CM | POA: Diagnosis not present

## 2023-01-29 DIAGNOSIS — I5033 Acute on chronic diastolic (congestive) heart failure: Secondary | ICD-10-CM | POA: Diagnosis not present

## 2023-01-29 DIAGNOSIS — N179 Acute kidney failure, unspecified: Secondary | ICD-10-CM | POA: Diagnosis not present

## 2023-01-29 DIAGNOSIS — K7581 Nonalcoholic steatohepatitis (NASH): Secondary | ICD-10-CM | POA: Diagnosis not present

## 2023-01-29 HISTORY — PX: RIGHT HEART CATH: CATH118263

## 2023-01-29 LAB — POCT I-STAT EG7
Acid-Base Excess: 6 mmol/L — ABNORMAL HIGH (ref 0.0–2.0)
Acid-Base Excess: 8 mmol/L — ABNORMAL HIGH (ref 0.0–2.0)
Bicarbonate: 31.2 mmol/L — ABNORMAL HIGH (ref 20.0–28.0)
Bicarbonate: 32.4 mmol/L — ABNORMAL HIGH (ref 20.0–28.0)
Calcium, Ion: 1.09 mmol/L — ABNORMAL LOW (ref 1.15–1.40)
Calcium, Ion: 1.11 mmol/L — ABNORMAL LOW (ref 1.15–1.40)
HCT: 33 % — ABNORMAL LOW (ref 39.0–52.0)
HCT: 33 % — ABNORMAL LOW (ref 39.0–52.0)
Hemoglobin: 11.2 g/dL — ABNORMAL LOW (ref 13.0–17.0)
Hemoglobin: 11.2 g/dL — ABNORMAL LOW (ref 13.0–17.0)
O2 Saturation: 75 %
O2 Saturation: 77 %
Potassium: 3.8 mmol/L (ref 3.5–5.1)
Potassium: 3.8 mmol/L (ref 3.5–5.1)
Sodium: 139 mmol/L (ref 135–145)
Sodium: 139 mmol/L (ref 135–145)
TCO2: 32 mmol/L (ref 22–32)
TCO2: 34 mmol/L — ABNORMAL HIGH (ref 22–32)
pCO2, Ven: 44.3 mm[Hg] (ref 44–60)
pCO2, Ven: 45.1 mm[Hg] (ref 44–60)
pH, Ven: 7.455 — ABNORMAL HIGH (ref 7.25–7.43)
pH, Ven: 7.464 — ABNORMAL HIGH (ref 7.25–7.43)
pO2, Ven: 39 mm[Hg] (ref 32–45)
pO2, Ven: 39 mm[Hg] (ref 32–45)

## 2023-01-29 LAB — BASIC METABOLIC PANEL
Anion gap: 12 (ref 5–15)
BUN: 40 mg/dL — ABNORMAL HIGH (ref 8–23)
CO2: 30 mmol/L (ref 22–32)
Calcium: 9.4 mg/dL (ref 8.9–10.3)
Chloride: 97 mmol/L — ABNORMAL LOW (ref 98–111)
Creatinine, Ser: 2.51 mg/dL — ABNORMAL HIGH (ref 0.61–1.24)
GFR, Estimated: 28 mL/min — ABNORMAL LOW (ref >60)
Glucose, Bld: 139 mg/dL — ABNORMAL HIGH (ref 70–99)
Potassium: 4.1 mmol/L (ref 3.5–5.1)
Sodium: 139 mmol/L (ref 135–145)

## 2023-01-29 LAB — GLUCOSE, CAPILLARY
Glucose-Capillary: 107 mg/dL — ABNORMAL HIGH (ref 70–99)
Glucose-Capillary: 135 mg/dL — ABNORMAL HIGH (ref 70–99)
Glucose-Capillary: 133 mg/dL — ABNORMAL HIGH (ref 70–99)
Glucose-Capillary: 103 mg/dL — ABNORMAL HIGH (ref 70–99)

## 2023-01-29 LAB — MAGNESIUM: Magnesium: 2.8 mg/dL — ABNORMAL HIGH (ref 1.7–2.4)

## 2023-01-29 SURGERY — RIGHT HEART CATH
Anesthesia: LOCAL

## 2023-01-29 MED ORDER — LIDOCAINE HCL (PF) 1 % IJ SOLN
INTRAMUSCULAR | Status: DC | PRN
  Administered 2023-01-29: 2 mL

## 2023-01-29 MED ORDER — LIDOCAINE HCL (PF) 1 % IJ SOLN
INTRAMUSCULAR | Status: AC
  Filled 2023-01-29: qty 30

## 2023-01-29 MED ORDER — HEPARIN (PORCINE) IN NACL 1000-0.9 UT/500ML-% IV SOLN
INTRAVENOUS | Status: DC | PRN
  Administered 2023-01-29: 500 mL

## 2023-01-29 SURGICAL SUPPLY — 6 items
CATH BALLN WEDGE 5F 110CM (CATHETERS) IMPLANT
MAT PREVALON FULL STRYKER (MISCELLANEOUS) IMPLANT
PACK CARDIAC CATHETERIZATION (CUSTOM PROCEDURE TRAY) ×1 IMPLANT
SHEATH GLIDE SLENDER 4/5FR (SHEATH) IMPLANT
TRANSDUCER W/STOPCOCK (MISCELLANEOUS) IMPLANT
TUBING ART PRESS 72 MALE/FEM (TUBING) IMPLANT

## 2023-01-29 NOTE — Interval H&P Note (Signed)
History and Physical Interval Note:  01/29/2023 12:41 PM  Daniel Crawford  has presented today for surgery, with the diagnosis of chf.  The various methods of treatment have been discussed with the patient and family. After consideration of risks, benefits and other options for treatment, the patient has consented to  Procedure(s): RIGHT HEART CATH (N/A) as a surgical intervention.  The patient's history has been reviewed, patient examined, no change in status, stable for surgery.  I have reviewed the patient's chart and labs.  Questions were answered to the patient's satisfaction.     Takyla Kuchera [employer]

## 2023-01-29 NOTE — H&P (View-Only) (Signed)
Advanced Heart Failure Rounding Note  PCP-Cardiologist: Nona Dell, MD   Subjective:    sCr fairly stable 2.6>2.5.  Diuresed 2.25L on Torsemide PO 80 BID. Feels better this morning. No SOB. Plan for RHC today.   Objective:   Weight Range: (!) 155.4 kg Body mass index is 43.99 kg/m.   Vital Signs:   Temp:  [97.8 F (36.6 C)-98.3 F (36.8 C)] 98.1 F (36.7 C) (11/05 0708) Pulse Rate:  [65-77] 77 (11/05 0708) Resp:  [11-20] 19 (11/05 0708) BP: (90-109)/(47-60) 105/56 (11/05 0708) SpO2:  [95 %-100 %] 100 % (11/05 0708) Weight:  [155.4 kg] 155.4 kg (11/05 0500) Last BM Date : 01/25/23  Weight change: Filed Weights   01/27/23 0433 01/28/23 0445 01/29/23 0500  Weight: (!) 161.8 kg (!) 157.3 kg (!) 155.4 kg    Intake/Output:   Intake/Output Summary (Last 24 hours) at 01/29/2023 0907 Last data filed at 01/29/2023 0827 Gross per 24 hour  Intake 534 ml  Output 2250 ml  Net -1716 ml    Physical Exam   General:  Chronically ill appearing. No resp difficulty. Jaundice HEENT: normal. Sclera yellow. Neck: supple. JVP 7. Carotids 2+ bilat; no bruits. No lymphadenopathy or thryomegaly appreciated. Cor: PMI nondisplaced. Regular rate & rhythm. No rubs, gallops or murmurs. Lungs: clear Abdomen: soft, nontender, distended. No hepatosplenomegaly. No bruits or masses. Good bowel sounds. Extremities: no cyanosis, clubbing, rash, 2+ edema BLE Neuro: alert & orientedx3, cranial nerves grossly intact. Affect pleasant.  Telemetry   SR 60s  Labs    CBC Recent Labs    01/27/23 0303  WBC 4.6  HGB 9.3*  HCT 28.3*  MCV 85.5  PLT 68*   Basic Metabolic Panel Recent Labs    56/21/30 0301 01/29/23 0544  NA 135 139  K 3.8 4.1  CL 93* 97*  CO2 33* 30  GLUCOSE 135* 139*  BUN 41* 40*  CREATININE 2.60* 2.51*  CALCIUM 9.4 9.4  MG 3.0* 2.8*   Liver Function Tests No results for input(s): "AST", "ALT", "ALKPHOS", "BILITOT", "PROT", "ALBUMIN" in the last 72  hours.  BNP: BNP (last 3 results) Recent Labs    12/20/22 1418 01/10/23 1101 01/21/23 1359  BNP 56.0 47.0 31.7    Imaging    No results found.   Medications:     Scheduled Medications:  dapagliflozin propanediol  10 mg Oral QAC breakfast   gabapentin  100 mg Oral BID   insulin aspart  0-15 Units Subcutaneous TID WC   insulin aspart  0-5 Units Subcutaneous QHS   lactulose  30 g Oral BID   levothyroxine  88 mcg Oral QAC breakfast   pantoprazole  40 mg Oral Daily   rifaximin  550 mg Oral BID   senna-docusate  1 tablet Oral BID   sertraline  100 mg Oral Daily   sodium chloride flush  10 mL Intravenous Q12H   sodium chloride flush  3 mL Intravenous Q12H   spironolactone  100 mg Oral Daily   torsemide  80 mg Oral BID   traZODone  50 mg Oral QHS   PRN Medications: acetaminophen **OR** acetaminophen, albuterol, guaiFENesin, hydrOXYzine, ondansetron (ZOFRAN) IV, polyethylene glycol, traMADol  Patient Profile   [name] is a 67 year old with a history of cirrhosis (liver bx 2023 Stage IV cirrhosis), NASH, Hypothyroidism, HTN, DMII, CKD StageIII, COPD, and HFpEF. Admitted with A/C HFpEF.   Assessment/Plan   1. A/C HFpEF - Most recent Echo 06/2022 LVEF 55% with normal  RV function.  - Eight admits over the last year with decompensated heart failure/anasarca and hepatic encephalopathy. Concern for compliance with home medications and fluid intake.  - Echo 01/23/23: EF 55-60%, no RWMA, mild concentric LVH, normal RV, no MR, trivial TR - Continue Torsemide 80mg  BID. Weight continues to trend down. - Spironolactone 100 mg daily  - Will hold Coreg d/t soft BP. No clear need for BB with HFpEF. - Continue Farxiga 10 mg daily - UNNA boots  - RHC today to assess volume status   2. Cirrhosis, NASH - Followed by GI in [place]. Stage IV cirrhosis, had liver biopsy 2023.  - Ammionia on admit 60 10/29 - Continue lactulose and rifaximin per primary team.    3. CKD Stage IIIb -  Creatinine baseline previously low 2s - Cr on admit 1.96. Peaked at 2.9. Now 2.5 today. - Diuresis as above. CTM   4. DMII  - SSI + at bedtime insulin per primary   5. Anemia  -Hgb 9.3 (chronically 8s-9s). No obvious source of bleeding.  -Iron stores low. Received IV iron.   6. GOC  -Code status changed to DNR/DNI. Agree, long-term prognosis is poor. -Appreciate palliative care input  Length of Stay: 8  [place] Lee, NP  01/29/2023, 9:07 AM  Advanced Heart Failure Team Pager [phone] (M-F; 7a - 5p)  Please contact [facility] for night-coverage after hours (5p -7a ) and weekends on [email or web address]   Patient seen with NP, agree with the above note.   I/Os net negative 1716 on po torsemide, weight down another 4 lbs.   No complaints.   General: NAD Neck:JVP ?8, no thyromegaly or thyroid nodule.  Lungs: Clear to auscultation bilaterally with normal respiratory effort. CV: Nondisplaced PMI.  Heart regular S1/S2, no S3/S4, no murmur.  1+ edema 1/2 to knees bilaterally.   Abdomen: Soft, nontender, no hepatosplenomegaly, no distention.  Skin: Intact without lesions or rashes.  Neurologic: Alert and oriented x 3.  Psych: Normal affect. Extremities: No clubbing or cyanosis.  HEENT: Normal.   Exam difficult for volume, has diuresed significantly this admission.  Creatinine mildly lower today at 2.5.   - Continue  torsemide 80 mg bid today.  - Continue spironolactone 100 mg daily and dapagliflozin 10 mg daily.  - RHC today to assess right and left-sided filling pressures to see if we have him fully diuresed.  Exam is somewhat difficult. Would like him as completely diuresed as possible before discharge.  Discussed risks/benefits and he agrees to procedure.   Marca Ancona 01/29/2023 11:01 AM

## 2023-01-29 NOTE — Progress Notes (Signed)
2100: This nurse attempted to get second IV as part of the order to prepare the patient for the next day cath lab with no success. A new order for IV team consult to place one.  2144: A message was sent to this RN from RN Doreene Nest asking to change the time of the order for 0900. Kara Mead states: "since it is for a procedure tomorrow and to prevent it from falling out or becoming dislodged while he is sleeping!"  Order was modified to 0900.  0720: Day shift nurse is notified while given report.

## 2023-01-29 NOTE — TOC Progression Note (Signed)
Transition of Care [facility]) - Progression Note    Patient Details  Name: Daniel Crawford MRN: [id number] Date of Birth: 25-Curbow-1957  [facility] CM/SW Contact  Nicanor Bake Phone Number: [phone] 01/29/2023, 2:45 PM  Clinical Narrative:  HF CSW met with with pt at bedside to discuss SNF options. Pt stated that he is no longer interested in SNF. Pt state that he thinks it will be best for him to be in his own home and that he has great support from family/friends. Pt stated that he would like to be dc with HH services.    TOC will continue following.          Expected Discharge Plan and Services                                               Social Determinants of Health (SDOH) Interventions SDOH Screenings   Food Insecurity: No Food Insecurity (01/21/2023)  Housing: Low Risk  (01/21/2023)  Transportation Needs: No Transportation Needs (01/21/2023)  Utilities: Not At Risk (01/21/2023)  Depression (PHQ2-9): Medium Risk (10/27/2019)  Financial Resource Strain: Low Risk  (03/28/2021)   Received from [facility]  Tobacco Use: Medium Risk (01/21/2023)    Readmission Risk Interventions    12/21/2022    9:17 AM 10/20/2022    2:15 PM 10/04/2022    9:36 AM  Readmission Risk Prevention Plan  Transportation Screening Complete Complete Complete  Medication Review [employer]ographer) Complete Complete Complete  PCP or Specialist appointment within 3-5 days of discharge  Not Complete   HRI or Home Care Consult Complete Complete Complete  SW Recovery Care/Counseling Consult Complete Complete Complete  Palliative Care Screening Not Applicable Not Applicable Not Applicable  Skilled Nursing Facility Not Applicable Not Applicable Not Applicable

## 2023-01-29 NOTE — Progress Notes (Signed)
Progress Note   Patient: Daniel Crawford ZOX:[id number] DOB: [birth year] DOA: 01/21/2023     8 DOS: the patient was seen and examined on 01/29/2023   Brief hospital course: Daniel Crawford was admitted to the hospital with the working diagnosis of heart failure exacerbation.   62/M with history of cirrhosis, fatty liver disease, morbid obesity, diastolic CHF, CKD 3B/4, COPD, type 2 diabetes mellitus, hypothyroidism, hypertension, frequent hospitalizations with fluid overload, discharged from [facility] 4 weeks ago after admission for same.  Returns back to the hospital in CHF exacerbation with significant weight gain.   Patient has been placed on IV furosemide continuous infusion.   11/05 right heart catheterization with reasonably optimal filling pressures.  Possible discharge in 24 to 48 hrs.   Assessment and Plan: * Acute on chronic diastolic (congestive) heart failure (HCC) Echocardiogram with preserved LV systolic function with EF 55 to 60%, mild LVH, RV systolic function preserved, no significant valvular disease.   11/05 right heart catheterization  RA mean 9  RV 26/7 PA 25/17 mean 21 PCWP 12 Cardiac output 13,6 and Index 5.0 (Fick).   Urine output is 2,250 ml  Systolic blood pressure 116 to 111 mmHg.   Continue with torsemide to 80 mg bid  Continue SGLT 2 inh and spironolactone.  Holding Ace inh or ARB due to risk of hypotension.  No B blocker due to risk of bradycardia.   Acute kidney injury superimposed on stage 3b chronic kidney disease (HCC) Hypokalemia.   Improved volume status, renal function with serum cr at 2,51 with K at 4,1 and serum bicarbonate at 30, Na 139 and Mg 2.8   Continue diuresis and follow up renal function in am.   Essential hypertension Continue blood pressure monitoring.  Volume status has improved.   Liver cirrhosis secondary to NASH (HCC) Continue lactulose decrease to bid.  Continue with rifaximin.  No sings of decompensated liver failure.    Thrombocytopenia (HCC) Anemia of chronic disease.  Cell count today with hgb at 9,3 and Plt 68   Iron deficiency anemia.   T2DM (type 2 diabetes mellitus) (HCC) Continue glucose cover and monitoring with insulin sliding scale.  Fasting glucose is 139 mg.dl today.   Acquired hypothyroidism Continue levothyroxine.   GERD (gastroesophageal reflux disease) Continue pantoprazole.   Depression with anxiety Continue hydroxyzine (decrease dose to 25 mg) and sertraline.  Trazodone at night.   COPD (chronic obstructive pulmonary disease) (HCC) No clinical signs of exacerbation, continue with bronchodilator therapy.   Obesity, class 3 Calculated BMI 45,8   Sleep apnea, continue Cpap.   Positive back pain, continue with gabapentin bid and PRN tramadol.  PT and OT. Out of bed to chair tid with meals.         Subjective: patient is feeling better, dyspnea and edema have improved, no chest pain.   Physical Exam: Vitals:   01/29/23 1244 01/29/23 1249 01/29/23 1254 01/29/23 1259  BP: 121/62 115/64 117/66 117/69  Pulse: 66 67 70 68  Resp: 13 13 12 13   Temp:      TempSrc:      SpO2: 99% 98% 98%   Weight:      Height:       Neurology awake and alert ENT with mild pallor Cardiovascular with S1 and S2 present and regular with no gallops, rubs or murmurs No JVD Trace lower extremity edema (unna boots in place) Respiratory with no rales or wheezing, no rhonchi Abdomen with no distention  Data Reviewed:  Family Communication: no family at the bedside   Disposition: Status is: Inpatient Remains inpatient appropriate because: optimization of medical therapy possible discharge home in 24 to 48 hrs.   Planned Discharge Destination: Home    Author: Coralie Keens, MD 01/29/2023 2:56 PM  For on call review [email or web address].

## 2023-01-29 NOTE — Evaluation (Signed)
Occupational Therapy Evaluation Patient Details Name: Daniel Crawford MRN: [id number] DOB: Sep 17, 1955 Today's Date: 01/29/2023   History of Present Illness 67 y/o male presents [facility] 01/21/23 w/ CHF exacerbation w/ significant weight gain. Prior admit from [facility] 4 weeks prior for same episode. PMHx: cirrhosis, fatty liver disease, morbid obesity, diastolic CHF, CKD 3B/4, COPD, type 2 diabetes mellitus, hypothyroidism, hypertension, frequent hospitalizations with fluid overload   Clinical Impression   PTA, pt lived alone and was mod I for increased time, use of AD/AE, and compensatory techniques during ADL and IADL. Upon eval, pt performing ADL with CGA-min A. Pt presents with decreased balance, activity tolerance, safety, executive function. Pt ambulatory in room with CGA this session. Recommending inpatient rehab <3 hours/day based on assessment today, but pt highly motivated to progress to home and has good support from friends and neighbors per his report.       If plan is discharge home, recommend the following: A little help with walking and/or transfers;A little help with bathing/dressing/bathroom;Assistance with cooking/housework;Assist for transportation;Help with stairs or ramp for entrance;Direct supervision/assist for medications management    Functional Status Assessment  Patient has had a recent decline in their functional status and demonstrates the ability to make significant improvements in function in a reasonable and predictable amount of time.  Equipment Recommendations  None recommended by OT    Recommendations for Other Services       Precautions / Restrictions Precautions Precautions: Fall Restrictions Weight Bearing Restrictions: No      Mobility Bed Mobility Overal bed mobility: Needs Assistance Bed Mobility: Supine to Sit, Sit to Supine     Supine to sit: Contact guard Sit to supine: Supervision   General bed mobility comments: sleeps in recliner at  baseline, but only needing supervision A today for OOB for safety    Transfers Overall transfer level: Needs assistance Equipment used: Rolling walker (2 wheels) Transfers: Sit to/from Stand Sit to Stand: Contact guard assist           General transfer comment: for safety; bed slightly elevated to accommodate for pt's height and pt without low sitting surfaces at home (lift chair, etc)      Balance Overall balance assessment: Needs assistance Sitting-balance support: Feet supported, No upper extremity supported Sitting balance-Leahy Scale: Fair Sitting balance - Comments: statically   Standing balance support: Bilateral upper extremity supported, During functional activity, Reliant on assistive device for balance Standing balance-Leahy Scale: Poor Standing balance comment: CGA for static standing; RW dynamically                           ADL either performed or assessed with clinical judgement   ADL Overall ADL's : Needs assistance/impaired Eating/Feeding: Set up;Bed level   Grooming: Contact guard assist;Standing   Upper Body Bathing: Set up;Sitting   Lower Body Bathing: Minimal assistance;Sit to/from stand   Upper Body Dressing : Set up;Sitting   Lower Body Dressing: Contact guard assist Lower Body Dressing Details (indicate cue type and reason): pt wears slide on shoes at baseline Toilet Transfer: Contact guard assist;Rolling walker (2 wheels);Ambulation   Toileting- Clothing Manipulation and Hygiene: Contact guard assist;Sit to/from stand       Functional mobility during ADLs: Contact guard assist;Rolling walker (2 wheels)       Vision Baseline Vision/History:  (has bifocals but reports he has not adjusted to them yet) Ability to See in Adequate Light: 0 Adequate Patient Visual Report: No change from  baseline Vision Assessment?: No apparent visual deficits     Perception Perception: Not tested       Praxis Praxis: Not tested        Pertinent Vitals/Pain Pain Assessment Pain Assessment: No/denies pain     Extremity/Trunk Assessment Upper Extremity Assessment Upper Extremity Assessment: Right hand dominant;Generalized weakness (weaker proximally than distally.)   Lower Extremity Assessment Lower Extremity Assessment: Defer to PT evaluation       Communication Communication Communication: No apparent difficulties   Cognition Arousal: Alert Behavior During Therapy: WFL for tasks assessed/performed Overall Cognitive Status: Impaired/Different from baseline Area of Impairment: Memory, Safety/judgement, Awareness, Problem solving                     Memory: Decreased short-term memory Following Commands: Follows one step commands consistently, Follows one step commands with increased time (1-2 step commands) Safety/Judgement: Decreased awareness of deficits Awareness: Emergent Problem Solving: Slow processing, Requires verbal cues, Requires tactile cues General Comments: increased time for orientation but answering appropriately, fair problem solving with money management questions, following commands, pleasant and conversational.     General Comments  VSS on RA    Exercises     Shoulder Instructions      Home Living Family/patient expects to be discharged to:: Private residence Living Arrangements: Alone Available Help at Discharge: Friend(s);Available PRN/intermittently Type of Home: Apartment Home Access: Level entry     Home Layout: One level     Bathroom Shower/Tub: Producer, television/film/video: Handicapped height     Home Equipment: Agricultural consultant (2 wheels);Grab bars - tub/shower;Cane - quad;Shower Diplomatic Services operational officer (4 wheels)   Additional Comments: pt reports he has life alert as well      Prior Functioning/Environment Prior Level of Function : Independent/Modified Independent;History of Falls (last six months)             Mobility Comments: Leans on  walls within the home; quad cane or rollator for longer distances ADLs Comments: Independent        OT Problem List: Decreased strength;Decreased activity tolerance;Impaired balance (sitting and/or standing);Decreased cognition;Decreased safety awareness;Decreased knowledge of use of DME or AE      OT Treatment/Interventions: Self-care/ADL training;Therapeutic exercise;DME and/or AE instruction;Energy conservation;Balance training;Patient/family education;Therapeutic activities    OT Goals(Current goals can be found in the care plan section) Acute Rehab OT Goals Patient Stated Goal: go home OT Goal Formulation: With patient Time For Goal Achievement: 02/12/23 Potential to Achieve Goals: Good  OT Frequency: Min 1X/week    Co-evaluation              AM-PAC OT "6 Clicks" Daily Activity     Outcome Measure Help from another person eating meals?: None Help from another person taking care of personal grooming?: A Little Help from another person toileting, which includes using toliet, bedpan, or urinal?: A Little Help from another person bathing (including washing, rinsing, drying)?: A Little Help from another person to put on and taking off regular upper body clothing?: A Little Help from another person to put on and taking off regular lower body clothing?: A Little 6 Click Score: 19   End of Session Equipment Utilized During Treatment: Gait belt;Rolling walker (2 wheels) Nurse Communication: Mobility status  Activity Tolerance: Patient tolerated treatment well Patient left: in bed;with call bell/phone within reach;with bed alarm set;with nursing/sitter in room  OT Visit Diagnosis: Unsteadiness on feet (R26.81);Muscle weakness (generalized) (M62.81);Other symptoms and signs involving cognitive function;History of falling (Z91.81)  Time: 1610-9604 OT Time Calculation (min): 27 min Charges:  OT General Charges $OT Visit: 1 Visit OT Evaluation $OT Eval Moderate  Complexity: 1 Mod OT Treatments $Self Care/Home Management : 8-22 mins  Tyler Deis, OTR/L [facility] Acute Rehabilitation Office: [phone]   Myrla Halsted 01/29/2023, 9:16 AM

## 2023-01-29 NOTE — Progress Notes (Addendum)
Advanced Heart Failure Rounding Note  PCP-Cardiologist: Nona Dell, MD   Subjective:    sCr fairly stable 2.6>2.5.  Diuresed 2.25L on Torsemide PO 80 BID. Feels better this morning. No SOB. Plan for RHC today.   Objective:   Weight Range: (!) 155.4 kg Body mass index is 43.99 kg/m.   Vital Signs:   Temp:  [97.8 F (36.6 C)-98.3 F (36.8 C)] 98.1 F (36.7 C) (11/05 0708) Pulse Rate:  [65-77] 77 (11/05 0708) Resp:  [11-20] 19 (11/05 0708) BP: (90-109)/(47-60) 105/56 (11/05 0708) SpO2:  [95 %-100 %] 100 % (11/05 0708) Weight:  [155.4 kg] 155.4 kg (11/05 0500) Last BM Date : 01/25/23  Weight change: Filed Weights   01/27/23 0433 01/28/23 0445 01/29/23 0500  Weight: (!) 161.8 kg (!) 157.3 kg (!) 155.4 kg    Intake/Output:   Intake/Output Summary (Last 24 hours) at 01/29/2023 0907 Last data filed at 01/29/2023 0827 Gross per 24 hour  Intake 534 ml  Output 2250 ml  Net -1716 ml    Physical Exam   General:  Chronically ill appearing. No resp difficulty. Jaundice HEENT: normal. Sclera yellow. Neck: supple. JVP 7. Carotids 2+ bilat; no bruits. No lymphadenopathy or thryomegaly appreciated. Cor: PMI nondisplaced. Regular rate & rhythm. No rubs, gallops or murmurs. Lungs: clear Abdomen: soft, nontender, distended. No hepatosplenomegaly. No bruits or masses. Good bowel sounds. Extremities: no cyanosis, clubbing, rash, 2+ edema BLE Neuro: alert & orientedx3, cranial nerves grossly intact. Affect pleasant.  Telemetry   SR 60s  Labs    CBC Recent Labs    01/27/23 0303  WBC 4.6  HGB 9.3*  HCT 28.3*  MCV 85.5  PLT 68*   Basic Metabolic Panel Recent Labs    56/21/30 0301 01/29/23 0544  NA 135 139  K 3.8 4.1  CL 93* 97*  CO2 33* 30  GLUCOSE 135* 139*  BUN 41* 40*  CREATININE 2.60* 2.51*  CALCIUM 9.4 9.4  MG 3.0* 2.8*   Liver Function Tests No results for input(s): "AST", "ALT", "ALKPHOS", "BILITOT", "PROT", "ALBUMIN" in the last 72  hours.  BNP: BNP (last 3 results) Recent Labs    12/20/22 1418 01/10/23 1101 01/21/23 1359  BNP 56.0 47.0 31.7    Imaging    No results found.   Medications:     Scheduled Medications:  dapagliflozin propanediol  10 mg Oral QAC breakfast   gabapentin  100 mg Oral BID   insulin aspart  0-15 Units Subcutaneous TID WC   insulin aspart  0-5 Units Subcutaneous QHS   lactulose  30 g Oral BID   levothyroxine  88 mcg Oral QAC breakfast   pantoprazole  40 mg Oral Daily   rifaximin  550 mg Oral BID   senna-docusate  1 tablet Oral BID   sertraline  100 mg Oral Daily   sodium chloride flush  10 mL Intravenous Q12H   sodium chloride flush  3 mL Intravenous Q12H   spironolactone  100 mg Oral Daily   torsemide  80 mg Oral BID   traZODone  50 mg Oral QHS   PRN Medications: acetaminophen **OR** acetaminophen, albuterol, guaiFENesin, hydrOXYzine, ondansetron (ZOFRAN) IV, polyethylene glycol, traMADol  Patient Profile   Daniel Crawford is a 67 year old with a history of cirrhosis (liver bx 2023 Stage IV cirrhosis), NASH, Hypothyroidism, HTN, DMII, CKD StageIII, COPD, and HFpEF. Admitted with A/C HFpEF.   Assessment/Plan   1. A/C HFpEF - Most recent Echo 06/2022 LVEF 55% with normal  RV function.  - Eight admits over the last year with decompensated heart failure/anasarca and hepatic encephalopathy. Concern for compliance with home medications and fluid intake.  - Echo 01/23/23: EF 55-60%, no RWMA, mild concentric LVH, normal RV, no Daniel, trivial TR - Continue Torsemide 80mg  BID. Weight continues to trend down. - Spironolactone 100 mg daily  - Will hold Coreg d/t soft BP. No clear need for BB with HFpEF. - Continue Farxiga 10 mg daily - UNNA boots  - RHC today to assess volume status   2. Cirrhosis, NASH - Followed by GI in [place]. Stage IV cirrhosis, had liver biopsy 2023.  - Ammionia on admit 60 10/29 - Continue lactulose and rifaximin per primary team.    3. CKD Stage IIIb -  Creatinine baseline previously low 2s - Cr on admit 1.96. Peaked at 2.9. Now 2.5 today. - Diuresis as above. CTM   4. DMII  - SSI + at bedtime insulin per primary   5. Anemia  -Hgb 9.3 (chronically 8s-9s). No obvious source of bleeding.  -Iron stores low. Received IV iron.   6. GOC  -Code status changed to DNR/DNI. Agree, long-term prognosis is poor. -Appreciate palliative care input  Length of Stay: 8  Daniel Lee, NP  01/29/2023, 9:07 AM  Advanced Heart Failure Team Pager [phone] (M-F; 7a - 5p)  Please contact [facility] for night-coverage after hours (5p -7a ) and weekends on [email or web address]   Patient seen with NP, agree with the above note.   I/Os net negative 1716 on po torsemide, weight down another 4 lbs.   No complaints.   General: NAD Neck:JVP ?8, no thyromegaly or thyroid nodule.  Lungs: Clear to auscultation bilaterally with normal respiratory effort. CV: Nondisplaced PMI.  Heart regular S1/S2, no S3/S4, no murmur.  1+ edema 1/2 to knees bilaterally.   Abdomen: Soft, nontender, no hepatosplenomegaly, no distention.  Skin: Intact without lesions or rashes.  Neurologic: Alert and oriented x 3.  Psych: Normal affect. Extremities: No clubbing or cyanosis.  HEENT: Normal.   Exam difficult for volume, has diuresed significantly this admission.  Creatinine mildly lower today at 2.5.   - Continue  torsemide 80 mg bid today.  - Continue spironolactone 100 mg daily and dapagliflozin 10 mg daily.  - RHC today to assess right and left-sided filling pressures to see if we have him fully diuresed.  Exam is somewhat difficult. Would like him as completely diuresed as possible before discharge.  Discussed risks/benefits and he agrees to procedure.   Marca Ancona 01/29/2023 11:01 AM

## 2023-01-29 NOTE — Plan of Care (Signed)
  Problem: Cardiac: Goal: Ability to achieve and maintain adequate cardiopulmonary perfusion will improve Outcome: Progressing   Problem: Education: Goal: Ability to describe self-care measures that Desha prevent or decrease complications (Diabetes Survival Skills Education) will improve Outcome: Progressing Goal: Individualized Educational Video(s) Outcome: Progressing   Problem: Coping: Goal: Ability to adjust to condition or change in health will improve Outcome: Progressing   Problem: Fluid Volume: Goal: Ability to maintain a balanced intake and output will improve Outcome: Progressing

## 2023-01-29 NOTE — Progress Notes (Signed)
Mobility Specialist Progress Note:    01/29/23 1154  Mobility  Activity Ambulated with assistance in hallway  Level of Assistance Contact guard assist, steadying assist  Assistive Device Front wheel walker  Distance Ambulated (ft) 100 ft  Activity Response Tolerated well  Mobility Referral Yes  $Mobility charge 1 Mobility  Mobility Specialist Start Time (ACUTE ONLY) 0915  Mobility Specialist Stop Time (ACUTE ONLY) 0932  Mobility Specialist Time Calculation (min) (ACUTE ONLY) 17 min   Received pt in bed having no complaints and agreeable to mobility. Pt was asymptomatic throughout ambulation. Was much stronger this session w/ mobility. Returned to room w/o fault. Left in chair w/ call bell in reach and all needs met.   Thompson Grayer Mobility Specialist  Please contact vis Secure Chat or  Rehab Office [phone]

## 2023-01-30 ENCOUNTER — Telehealth (HOSPITAL_COMMUNITY): Payer: Self-pay | Admitting: Licensed Clinical Social Worker

## 2023-01-30 ENCOUNTER — Other Ambulatory Visit (HOSPITAL_COMMUNITY): Payer: Self-pay

## 2023-01-30 DIAGNOSIS — I5033 Acute on chronic diastolic (congestive) heart failure: Secondary | ICD-10-CM | POA: Diagnosis not present

## 2023-01-30 LAB — GLUCOSE, CAPILLARY
Glucose-Capillary: 128 mg/dL — ABNORMAL HIGH (ref 70–99)
Glucose-Capillary: 64 mg/dL — ABNORMAL LOW (ref 70–99)
Glucose-Capillary: 94 mg/dL (ref 70–99)

## 2023-01-30 LAB — BASIC METABOLIC PANEL
Anion gap: 13 (ref 5–15)
BUN: 39 mg/dL — ABNORMAL HIGH (ref 8–23)
CO2: 28 mmol/L (ref 22–32)
Calcium: 9.5 mg/dL (ref 8.9–10.3)
Chloride: 96 mmol/L — ABNORMAL LOW (ref 98–111)
Creatinine, Ser: 2.61 mg/dL — ABNORMAL HIGH (ref 0.61–1.24)
GFR, Estimated: 27 mL/min — ABNORMAL LOW (ref >60)
Glucose, Bld: 125 mg/dL — ABNORMAL HIGH (ref 70–99)
Potassium: 3.7 mmol/L (ref 3.5–5.1)
Sodium: 137 mmol/L (ref 135–145)

## 2023-01-30 LAB — MAGNESIUM: Magnesium: 2.9 mg/dL — ABNORMAL HIGH (ref 1.7–2.4)

## 2023-01-30 MED ORDER — GLUCOSE 40 % PO GEL: 1.0000 | ORAL | Status: DC

## 2023-01-30 MED ORDER — LACTULOSE 10 GM/15ML PO SOLN
20.0000 g | Freq: Two times a day (BID) | ORAL | 0 refills | Status: DC
  Filled 2023-01-30: qty 946, 16d supply, fill #0

## 2023-01-30 MED ORDER — POTASSIUM CHLORIDE CRYS ER 20 MEQ PO TBCR
20.0000 meq | EXTENDED_RELEASE_TABLET | Freq: Once | ORAL | Status: DC
Start: 2023-01-30 — End: 2023-01-30
  Filled 2023-01-30: qty 1

## 2023-01-30 MED ORDER — TORSEMIDE 20 MG PO TABS
80.0000 mg | ORAL_TABLET | Freq: Two times a day (BID) | ORAL | 1 refills | Status: DC
  Filled 2023-01-30: qty 240, 30d supply, fill #0

## 2023-01-30 MED ORDER — POTASSIUM CHLORIDE CRYS ER 20 MEQ PO TBCR
20.0000 meq | EXTENDED_RELEASE_TABLET | Freq: Every day | ORAL | Status: DC
  Administered 2023-01-30: 20 meq via ORAL

## 2023-01-30 NOTE — Discharge Summary (Signed)
Physician Discharge Summary  Masiyah Frisinger QMV:[id number] DOB: November 21, 1955 DOA: 01/21/2023  PCP: [name], MD  Admit date: 01/21/2023 Discharge date: 01/30/2023  Time spent: 45 minutes  Recommendations for Outpatient Follow-up:  Advanced heart failure clinic 11/15, please check BMP at follow-up Heart failure para medicine PCP in 1 week   Discharge Diagnoses:  Principal Problem:   Acute on chronic diastolic (congestive) heart failure (HCC) Active Problems:   Acute kidney injury superimposed on stage 3b chronic kidney disease (HCC)   Essential hypertension   Liver cirrhosis secondary to NASH (HCC)   Thrombocytopenia (HCC)   T2DM (type 2 diabetes mellitus) (HCC)   Acquired hypothyroidism   GERD (gastroesophageal reflux disease)   Depression with anxiety   COPD (chronic obstructive pulmonary disease) (HCC)   Obesity, class 3   Discharge Condition: Improved  Diet recommendation: Low-sodium, heart healthy  Filed Weights   01/29/23 0500 01/29/23 0700 01/30/23 0400  Weight: (!) 155.4 kg (!) 155 kg (!) 152.6 kg    History of present illness:   67/M with history of cirrhosis, fatty liver disease, morbid obesity, diastolic CHF, CKD 3B/4, COPD, type 2 diabetes mellitus, hypothyroidism, hypertension, frequent hospitalizations with fluid overload, discharged from [facility] 4 weeks ago after admission for same. -Back in the ED with 30 to 40 pound weight gain, increasing swelling, dyspnea on exertion and decreased functional status. -In the ED, vital signs stable, sodium 131, creatinine 1.9, hemoglobin 9.7, platelets 68, troponin normal, chest x-ray without acute findings  Hospital Course:   Acute on chronic diastolic CHF, high-output failure -Last echo 4/24 with EF 55%, normal RV -Numerous hospitalizations with volume overload this year, almost every month -Repeat echo 01/23/2023 with a EF of 55-60%, mild LVH, normal RV -Diuresed aggressively on Lasix drip, followed by  heart failure team -Weight down 63 LB to 336 LB (152.6 kg) at discharge -Right heart cath yesterday 11/5 noted RA pressures of 9, PA 25/17, wedge of 10, cardiac index 5.01 -Switched over to torsemide 80 Mg twice daily, Aldactone, Farxiga continued -Palliative consulted for goals of care on account of frequent admissions, poor prognosis with decompensated cirrhosis, now DNR -Follow-up in heart failure clinic clinic arranged for 11/5, para medicine referral sent   NASH cirrhosis -IV Lasix as above - Continue home spironolactone, lactulose, rifaximin -Ammonia level mildly elevated, increase lactulose, improved   CKD 3B/4 -Baseline creatinine 1.7-2 range -Now creatinine stable in the 2.5-2.6 range   Anemia Thrombocytopenia -At baseline likely from chronic liver disease and CKD -Some worsening of anemia, could be hemodilution related -Anemia panel with iron deficiency as well, given IV iron, hemoglobin stable in the 9 range   Hypertension - Lasix as above - Continue home spironolactone, carvedilol   Diabetes -CBGs are stable, continue [place]   GERD - Continue home PPI   Hypothyroidism - Continue home Synthroid   OSA -Reportedly had a negative sleep study recently, declines this diagnosis   Obesity - Noted, discussed importance of diet and lifestyle modification   Depression Anxiety - Continue home sertraline, hydroxyzine, trazodone  Discharge Exam: Vitals:   01/30/23 0745 01/30/23 0750  BP: (!) 110/58   Pulse: 81   Resp:    Temp: 99.4 F (37.4 C) 99.4 F (37.4 C)  SpO2: 99%    Gen: Awake, Alert, Oriented X 3, morbidly obese HEENT: no JVD Lungs: Good air movement bilaterally, CTAB CVS: S1S2/RRR Abd: soft, Non tender, non distended, BS present Extremities: No edema Skin: no new rashes on exposed skin  Discharge Instructions   Discharge Instructions     Diet - low sodium heart healthy   Complete by: As directed    Increase activity slowly   Complete  by: As directed    No wound care   Complete by: As directed       Allergies as of 01/30/2023   No Known Allergies      Medication List     STOP taking these medications    carvedilol 6.25 MG tablet Commonly known as: COREG   metolazone 2.5 MG tablet Commonly known as: ZAROXOLYN       TAKE these medications    acetaminophen 500 MG tablet Commonly known as: TYLENOL Take 1,000 mg by mouth every 6 (six) hours as needed for moderate pain.   albuterol 108 (90 Base) MCG/ACT inhaler Commonly known as: VENTOLIN HFA Inhale 2 puffs into the lungs every 6 (six) hours as needed for wheezing or shortness of breath.   dapagliflozin propanediol 10 MG Tabs tablet Commonly known as: Farxiga Take 1 tablet (10 mg total) by mouth daily before breakfast.   gabapentin 100 MG capsule Commonly known as: NEURONTIN Take 100 mg by mouth daily as needed (pain).   hydrOXYzine 50 MG capsule Commonly known as: VISTARIL Take 1 capsule (50 mg total) by mouth 3 (three) times daily as needed for anxiety or nausea.   lactose free nutrition Liqd Take 237 mLs by mouth every evening.   lactulose 10 GM/15ML solution Commonly known as: CHRONULAC Take 30 mLs (20 g total) by mouth 2 (two) times daily.   levothyroxine 88 MCG tablet Commonly known as: SYNTHROID Take 1 tablet (88 mcg total) by mouth daily before breakfast.   ondansetron 4 MG tablet Commonly known as: ZOFRAN Take 4 mg by mouth 2 (two) times daily as needed for nausea or vomiting.   pantoprazole 40 MG tablet Commonly known as: PROTONIX Take 1 tablet (40 mg total) by mouth daily.   polyethylene glycol 17 g packet Commonly known as: MIRALAX / GLYCOLAX Take 17 g by mouth daily as needed for mild constipation.   potassium chloride SA 20 MEQ tablet Commonly known as: KLOR-CON M Take 2 tablets (40 mEq total) by mouth 2 (two) times daily.   rifaximin 550 MG Tabs tablet Commonly known as: XIFAXAN Take 1 tablet (550 mg total) by  mouth 2 (two) times daily.   sertraline 100 MG tablet Commonly known as: ZOLOFT Take 100 mg by mouth daily.   spironolactone 100 MG tablet Commonly known as: ALDACTONE Take 100 mg by mouth daily.   Torsemide 40 MG Tabs Take 80 mg by mouth 2 (two) times daily. What changed: Another medication with the same name was removed. Continue taking this medication, and follow the directions you see here.   traZODone 50 MG tablet Commonly known as: DESYREL Take 1 tablet (50 mg total) by mouth at bedtime. What changed: how much to take       No Known Allergies  Follow-up Information     [facility] Follow up on 02/08/2023.   Specialty: Cardiology Why: Advanced Heart Failure Clinic at 2:30pm Contact information: [address] [place] [address] [phone]        [name], MD. Go in 8 day(s).   Specialties: Family Medicine, Sports Medicine Why: Hospital follow up appointment scheduled for Thursday, February 07, 2023 at 1:30 PM.  PLEASE ARRIVE 10-15 minutes early. PLEASE call and cancel/reschedule if you CANNOT make appointment.  Contact information: [address] [place] [address] [phone]                  The results of significant diagnostics from this hospitalization (including imaging, microbiology, ancillary and laboratory) are listed below for reference.    Significant Diagnostic Studies: CARDIAC CATHETERIZATION  Result Date: 01/29/2023 1. Filling pressures are reasonably optimized. 2. Normal PA pressure. 3. Low PAPi suggesting primarily RV failure. 4. High cardiac output.  Shunt run was not done but suspect this Tavis be due to known diagnosis of cirrhosis.   ECHOCARDIOGRAM COMPLETE  Result Date: 01/23/2023    ECHOCARDIOGRAM REPORT   Patient Name:   Daniel Crawford    Date of Exam: 01/23/2023 Medical Rec #:  [id number]  Height:       74.0 in Accession #:    [id number] Weight:        384.3 lb Date of Birth:  [birth year] BSA:          2.869 m Patient Age:    66 years   BP:           119/57 mmHg Patient Gender: M          HR:           73 bpm. Exam Location:  Inpatient Procedure: 2D Echo, Cardiac Doppler, Color Doppler and Intracardiac            Opacification Agent Indications:    CHF  History:        Patient has prior history of Echocardiogram examinations, most                 recent 07/07/2022. CHF, Signs/Symptoms:Dyspnea and Edema; Risk                 Factors:Hypertension and Diabetes. NASH, CKD, anasarca.  Sonographer:    Milda Smart Referring Phys: Benay Spice [facility]  Sonographer Comments: Patient is obese. Image acquisition challenging due to patient body habitus and Image acquisition challenging due to respiratory motion. IMPRESSIONS  1. Left ventricular ejection fraction, by estimation, is 55 to 60%. The left ventricle has normal function. The left ventricle has no regional wall motion abnormalities. There is mild concentric left ventricular hypertrophy. Left ventricular diastolic parameters were normal.  2. Right ventricular systolic function is normal. The right ventricular size is normal. Tricuspid regurgitation signal is inadequate for assessing PA pressure.  3. The mitral valve is grossly normal. No evidence of mitral valve regurgitation. No evidence of mitral stenosis.  4. The aortic valve is tricuspid. Aortic valve regurgitation is not visualized. No aortic stenosis is present.  5. Aortic dilatation noted. There is mild dilatation of the aortic root, measuring 43 mm. There is mild dilatation of the ascending aorta, measuring 42 mm. Comparison(s): No significant change from prior study. FINDINGS  Left Ventricle: Left ventricular ejection fraction, by estimation, is 55 to 60%. The left ventricle has normal function. The left ventricle has no regional wall motion abnormalities. Definity contrast agent was given IV to delineate the left ventricular  endocardial borders. The left  ventricular internal cavity size was normal in size. There is mild concentric left ventricular hypertrophy. Left ventricular diastolic parameters were normal. Right Ventricle: The right ventricular size is normal. No increase in right ventricular wall thickness. Right ventricular systolic function is normal. Tricuspid regurgitation signal is inadequate for assessing PA pressure. Left Atrium: Left atrial size was normal in size. Right Atrium: Right atrial size was normal in size. Pericardium: There is  no evidence of pericardial effusion. Mitral Valve: The mitral valve is grossly normal. No evidence of mitral valve regurgitation. No evidence of mitral valve stenosis. Tricuspid Valve: The tricuspid valve is grossly normal. Tricuspid valve regurgitation is trivial. No evidence of tricuspid stenosis. Aortic Valve: The aortic valve is tricuspid. Aortic valve regurgitation is not visualized. No aortic stenosis is present. Pulmonic Valve: The pulmonic valve was grossly normal. Pulmonic valve regurgitation is not visualized. No evidence of pulmonic stenosis. Aorta: Aortic dilatation noted. There is mild dilatation of the aortic root, measuring 43 mm. There is mild dilatation of the ascending aorta, measuring 42 mm. Venous: The inferior vena cava was not well visualized. IAS/Shunts: The atrial septum is grossly normal.  LEFT VENTRICLE PLAX 2D LVIDd:         5.30 cm   Diastology LVIDs:         4.30 cm   LV e' medial:    12.00 cm/s LV PW:         1.20 cm   LV E/e' medial:  7.8 LV IVS:        1.20 cm   LV e' lateral:   18.10 cm/s LVOT diam:     2.40 cm   LV E/e' lateral: 5.2 LV SV:         131 LV SV Index:   46 LVOT Area:     4.52 cm  RIGHT VENTRICLE RV S prime:     18.30 cm/s TAPSE (M-mode): 3.8 cm LEFT ATRIUM             Index        RIGHT ATRIUM           Index LA diam:        4.60 cm 1.60 cm/m   RA Area:     25.35 cm LA Vol (A2C):   62.7 ml 21.86 ml/m  RA Volume:   83.45 ml  29.09 ml/m LA Vol (A4C):   53.2 ml 18.55  ml/m LA Biplane Vol: 58.7 ml 20.46 ml/m  AORTIC VALVE LVOT Vmax:   132.00 cm/s LVOT Vmean:  102.000 cm/s LVOT VTI:    0.290 m  AORTA Ao Root diam: 4.30 cm Ao Asc diam:  4.20 cm MITRAL VALVE MV Area (PHT): 3.03 cm    SHUNTS MV Decel Time: 250 msec    Systemic VTI:  0.29 m MV E velocity: 93.80 cm/s  Systemic Diam: 2.40 cm MV A velocity: 78.40 cm/s MV E/A ratio:  1.20 Lennie Odor MD Electronically signed by Lennie Odor MD Signature Date/Time: 01/23/2023/5:13:32 PM    Final    DG Chest 2 View  Result Date: 01/21/2023 CLINICAL DATA:  Chest pain. EXAM: CHEST - 2 VIEW COMPARISON:  12/20/2022. FINDINGS: Bilateral lung fields are clear. Bilateral costophrenic angles are clear. Normal cardio-mediastinal silhouette. No acute osseous abnormalities. The soft tissues are within normal limits. IMPRESSION: *No active cardiopulmonary disease. Electronically Signed   By: Jules Schick M.D.   On: 01/21/2023 16:39    Microbiology: No results found for this or any previous visit (from the past 240 hour(s)).   Labs: Basic Metabolic Panel: Recent Labs  Lab 01/25/23 0446 01/25/23 1308 01/26/23 0252 01/27/23 0303 01/28/23 0301 01/29/23 0544 01/29/23 1257 01/29/23 1358 01/30/23 0406  NA 135   < > 132* 135 135 139 139 139 137  K 3.7   < > 3.5 3.3* 3.8 4.1 3.8 3.8 3.7  CL 90*   < > 90* 90* 93* 97*  --   --  96*  CO2 33*   < > 34* 33* 33* 30  --   --  28  GLUCOSE 134*   < > 167* 129* 135* 139*  --   --  125*  BUN 37*   < > 42* 46* 41* 40*  --   --  39*  CREATININE 2.49*   < > 2.89* 2.84* 2.60* 2.51*  --   --  2.61*  CALCIUM 9.0   < > 8.9 9.3 9.4 9.4  --   --  9.5  MG 2.6*  --  2.7* 2.8* 3.0* 2.8*  --   --  2.9*  PHOS 5.6*  --   --   --   --   --   --   --   --    < > = values in this interval not displayed.   Liver Function Tests: Recent Labs  Lab 01/26/23 0252  AST 52*  ALT 23  ALKPHOS 79  BILITOT 3.4*  PROT 6.4*  ALBUMIN 2.7*   No results for input(s): "LIPASE", "AMYLASE" in the last 168  hours. No results for input(s): "AMMONIA" in the last 168 hours. CBC: Recent Labs  Lab 01/24/23 0342 01/25/23 0446 01/26/23 0252 01/27/23 0303 01/29/23 1257 01/29/23 1358  WBC 4.5 4.7 4.9 4.6  --   --   HGB 9.0* 9.1* 9.2* 9.3* 11.2* 11.2*  HCT 27.9* 27.9* 28.3* 28.3* 33.0* 33.0*  MCV 84.8 85.3 84.2 85.5  --   --   PLT 57* 66* 67* 68*  --   --    Cardiac Enzymes: No results for input(s): "CKTOTAL", "CKMB", "CKMBINDEX", "TROPONINI" in the last 168 hours. BNP: BNP (last 3 results) Recent Labs    12/20/22 1418 01/10/23 1101 01/21/23 1359  BNP 56.0 47.0 31.7    ProBNP (last 3 results) No results for input(s): "PROBNP" in the last 8760 hours.  CBG: Recent Labs  Lab 01/29/23 0606 01/29/23 1042 01/29/23 1601 01/29/23 2052 01/30/23 0607  GLUCAP 107* 135* 133* 103* 128*       Signed:  Zannie Cove MD.  [facility] 01/30/2023, 11:17 AM

## 2023-01-30 NOTE — Progress Notes (Signed)
Mobility Specialist Progress Note:   01/30/23 0920  Mobility  Activity Ambulated with assistance in hallway  Level of Assistance Contact guard assist, steadying assist  Assistive Device Front wheel walker  Distance Ambulated (ft) 150 ft  Activity Response Tolerated well  Mobility Referral Yes  $Mobility charge 1 Mobility  Mobility Specialist Start Time (ACUTE ONLY) 0920  Mobility Specialist Stop Time (ACUTE ONLY) 0935  Mobility Specialist Time Calculation (min) (ACUTE ONLY) 15 min   Pt agreeable to mobility session. Required only minG assist with ambulation. No c/o throughout. Pt left sitting at sink, NT set up for bath.   Addison Lank Mobility Specialist Please contact via SecureChat or  Rehab office at [phone]

## 2023-01-30 NOTE — Telephone Encounter (Signed)
Patient DC from hospital today- DC Summary sent to [facility] Paramedic for review and follow up.  Burna Sis, LCSW Clinical Social Worker Advanced Heart Failure Clinic Desk#: [phone] Cell#: [phone]

## 2023-01-30 NOTE — TOC Transition Note (Addendum)
Transition of Care [facility]) - CM/SW Discharge Note   Patient Details  Name: Daniel Crawford MRN: [id number] Date of Birth: 09/19/1955  [facility] CM/SW Contact:  Leone Haven, RN Phone Number: 01/30/2023, 11:44 AM   Clinical Narrative:    For dc today, NCM offered choice for HHPT, he chose [facility], NCM made referral to [place] with [facility], she is able to take referral. Soc will begin 24 to 48 hrs post dc.  He states a friend will transport him home today and his friend will be staying with him for a while also.  Per Rosetta Posner, he is already set up with Para Medicine.   Final next level of care: Home w Home Health Services Barriers to Discharge: No Barriers Identified   Patient Goals and CMS Choice CMS Medicare.gov Compare Post Acute Care list provided to:: Patient Choice offered to / list presented to : Patient  Discharge Placement                         Discharge Plan and Services Additional resources added to the After Visit Summary for                  DME Arranged: N/A DME Agency: NA       HH Arranged: PT HH Agency: [facility] Date [facility] Agency Contacted: 01/30/23 Time HH Agency Contacted: 1144 Representative spoke with at [facility] Agency: Marylene Land  Social Determinants of Health (SDOH) Interventions SDOH Screenings   Food Insecurity: No Food Insecurity (01/21/2023)  Housing: Low Risk  (01/21/2023)  Transportation Needs: No Transportation Needs (01/21/2023)  Utilities: Not At Risk (01/21/2023)  Depression (PHQ2-9): Medium Risk (10/27/2019)  Financial Resource Strain: Low Risk  (03/28/2021)   Received from [facility]  Tobacco Use: Medium Risk (01/21/2023)     Readmission Risk Interventions    12/21/2022    9:17 AM 10/20/2022    2:15 PM 10/04/2022    9:36 AM  Readmission Risk Prevention Plan  Transportation Screening Complete Complete Complete  Medication Review Oceanographer) Complete Complete Complete  PCP  or Specialist appointment within 3-5 days of discharge  Not Complete   HRI or Home Care Consult Complete Complete Complete  SW Recovery Care/Counseling Consult Complete Complete Complete  Palliative Care Screening Not Applicable Not Applicable Not Applicable  Skilled Nursing Facility Not Applicable Not Applicable Not Applicable

## 2023-01-30 NOTE — Progress Notes (Signed)
MD and primary nurse informed HR fluctuating 130's at highest down to 80's non sustained AFIB. MD states she Betzler order possible beta blocker. No new orders placed at this present time. Pt informed.

## 2023-01-30 NOTE — Progress Notes (Signed)
Advanced Heart Failure Rounding Note  PCP-Cardiologist: Nona Dell, MD   Subjective:    11/5 RHC: RA 9, PA 25/17 (21), PW 10, Pasat 77, CO/I 13.65.01. PAPi 0.8   Maintaining diuresis on home Torsemide PO 80 BID. Feels better this morning. No SOB.   Objective:   Weight Range: (!) 152.6 kg Body mass index is 43.2 kg/m.   Vital Signs:   Temp:  [97.8 F (36.6 C)-98.1 F (36.7 C)] 98 F (36.7 C) (11/06 0410) Pulse Rate:  [66-101] 101 (11/06 0410) Resp:  [12-19] 15 (11/06 0410) BP: (108-125)/(43-72) 112/72 (11/06 0410) SpO2:  [92 %-99 %] 99 % (11/06 0410) Weight:  [152.6 kg] 152.6 kg (11/06 0400) Last BM Date : 01/25/23  Weight change: Filed Weights   01/29/23 0500 01/29/23 0700 01/30/23 0400  Weight: (!) 155.4 kg (!) 155 kg (!) 152.6 kg    Intake/Output:   Intake/Output Summary (Last 24 hours) at 01/30/2023 0736 Last data filed at 01/30/2023 0400 Gross per 24 hour  Intake 577 ml  Output 3450 ml  Net -2873 ml    Physical Exam   General: Well appearing. No distress on RA HEENT: neck supple.  Cardiac: JVP difficult to visualize. S1 and S2 present. No murmurs or rub. Resp: Lung sounds clear and equal B/L Abdomen: Soft, non-tender, non-distended. + BS. Extremities: Warm and dry. No rash, cyanosis, or +2 edema.  Neuro: Alert and oriented x3. Affect pleasant. Moves all extremities without difficulty.  Telemetry   SR 80s w occasionally PVCs (personally reviewed)  Labs    CBC Recent Labs    01/29/23 1257 01/29/23 1358  HGB 11.2* 11.2*  HCT 33.0* 33.0*   Basic Metabolic Panel Recent Labs    24/40/10 0544 01/29/23 1257 01/29/23 1358 01/30/23 0406  NA 139   < > 139 137  K 4.1   < > 3.8 3.7  CL 97*  --   --  96*  CO2 30  --   --  28  GLUCOSE 139*  --   --  125*  BUN 40*  --   --  39*  CREATININE 2.51*  --   --  2.61*  CALCIUM 9.4  --   --  9.5  MG 2.8*  --   --  2.9*   < > = values in this interval not displayed.   Liver Function Tests No  results for input(s): "AST", "ALT", "ALKPHOS", "BILITOT", "PROT", "ALBUMIN" in the last 72 hours.  BNP: BNP (last 3 results) Recent Labs    12/20/22 1418 01/10/23 1101 01/21/23 1359  BNP 56.0 47.0 31.7    Imaging    CARDIAC CATHETERIZATION  Result Date: 01/29/2023 1. Filling pressures are reasonably optimized. 2. Normal PA pressure. 3. Low PAPi suggesting primarily RV failure. 4. High cardiac output.  Shunt run was not done but suspect this Oliv[place] be due to known diagnosis of cirrhosis.     Medications:     Scheduled Medications:  dapagliflozin propanediol  10 mg Oral QAC breakfast   gabapentin  100 mg Oral BID   insulin aspart  0-15 Units Subcutaneous TID WC   insulin aspart  0-5 Units Subcutaneous QHS   lactulose  30 g Oral BID   levothyroxine  88 mcg Oral QAC breakfast   pantoprazole  40 mg Oral Daily   rifaximin  550 mg Oral BID   senna-docusate  1 tablet Oral BID   sertraline  100 mg Oral Daily   sodium chloride flush  3 mL Intravenous Q12H   spironolactone  100 mg Oral Daily   torsemide  80 mg Oral BID   traZODone  50 mg Oral QHS   PRN Medications: acetaminophen **OR** acetaminophen, albuterol, guaiFENesin, hydrOXYzine, ondansetron (ZOFRAN) IV, polyethylene glycol, traMADol  Patient Profile   [name] is a 67 year old with a history of cirrhosis (liver bx 2023 Stage IV cirrhosis), NASH, Hypothyroidism, HTN, DMII, CKD StageIII, COPD, and HFpEF. Admitted with A/C HFpEF.   Assessment/Plan   1. A/C High-Output HFpEF - Most recent Echo 06/2022 LVEF 55% with normal RV function.  - Eight admits over the last year with decompensated heart failure/anasarca and hepatic encephalopathy. Concern for compliance with home medications and fluid intake.  - Echo 01/23/23: EF 55-60%, no RWMA, mild concentric LVH, normal RV, no MR, trivial TR - 11/5 RHC: RA 9, PA 25/17 (21), PW 10, Pasat 77, CO/I 13.65.01. PAPi 0.8 - High CO is likely related to cirrhosis, adequate filling  pressures - Continue Torsemide 80mg  BID, continues to maintain diuresis - Spironolactone 100 mg daily  - Stopped Coreg. No clear need for BB with HFpEF. - Continue Farxiga 10 mg daily - UNNA boots    2. Cirrhosis, NASH - Followed by GI in [place]. Stage IV cirrhosis, had liver biopsy 2023.  - Ammionia on admit 60 10/29 - Continue lactulose and rifaximin per primary team.    3. CKD Stage IIIb - Creatinine baseline previously low 2s - Cr on admit 1.96. Peaked at 2.9. Now 2.6 today, bump likely related to CIN. - Diuresis as above. CTM   4. DMII  - SSI + at bedtime insulin per primary   5. Anemia  -Hgb 9.3 (chronically 8s-9s). No obvious source of bleeding.  -Iron stores low. Received IV iron.   6. GOC  -Code status changed to DNR/DNI. Agree, long-term prognosis is poor. -Appreciate palliative care input  HF Team Medication Recommendations for Home: Farxiga 10mg  daily Spirolactone 100mg  daily Torsemide 80mg  BID  Paramedicine to follow this patient at discharge. Follow up as an outpatient: [facility] 11/15 2:30pm   Length of Stay: 9  [place] Lee, NP  01/30/2023, 7:36 AM  Advanced Heart Failure Team Pager [phone] (M-F; 7a - 5p)  Please contact [facility] for night-coverage after hours (5p -7a ) and weekends on [email or web address]  Patient seen with NP, agree with the above note.   Weight down another 5 lbs, 52 lbs totally.  Creatinine 2.5 => 2.6.   General: NAD Neck: JVP 8 cm, no thyromegaly or thyroid nodule.  Lungs: Clear to auscultation bilaterally with normal respiratory effort. CV: Nondisplaced PMI.  Heart regular S1/S2, no S3/S4, no murmur.  No peripheral edema.   Abdomen: Soft, nontender, no hepatosplenomegaly, no distention.  Skin: Intact without lesions or rashes.  Neurologic: Alert and oriented x 3.  Psych: Normal affect. Extremities: No clubbing or cyanosis.  HEENT: Normal.   RHC yesterday showed that patient is about as optimized as we can get him.  Has  predominantly RV failure with low PAPi.  Elevated cardiac output is likely related to cirrhosis.   I think he can go home today.  For diuretic regimen, would use torsemide 80 mg bid + spironolactone 100 mg daily + Farxiga 10 mg daily + KCl 40 bid.  Arrange for heart failure paramedicine and close followup in HF clinic.   Marca Ancona 01/30/2023 11:07 AM

## 2023-01-30 NOTE — Plan of Care (Signed)
  Problem: Education: Goal: Ability to demonstrate management of disease process will improve Outcome: Progressing   Problem: Education: Goal: Ability to verbalize understanding of medication therapies will improve Outcome: Progressing   Problem: Education: Goal: Individualized Educational Video(s) Outcome: Progressing   Problem: Activity: Goal: Capacity to carry out activities will improve Outcome: Progressing   Problem: Cardiac: Goal: Ability to achieve and maintain adequate cardiopulmonary perfusion will improve Outcome: Progressing   Problem: Education: Goal: Ability to describe self-care measures that [place] prevent or decrease complications (Diabetes Survival Skills Education) will improve Outcome: Progressing   Problem: Fluid Volume: Goal: Ability to maintain a balanced intake and output will improve Outcome: Progressing   Problem: Health Behavior/Discharge Planning: Goal: Ability to identify and utilize available resources and services will improve Outcome: Progressing   Problem: Health Behavior/Discharge Planning: Goal: Ability to manage health-related needs will improve Outcome: Progressing

## 2023-01-30 NOTE — TOC Progression Note (Signed)
Transition of Care [facility]) - Progression Note    Patient Details  Name: Daniel Crawford MRN: [id number] Date of Birth: 1956/03/05  [facility]) CM/SW Contact  Nicanor Bake Phone Number: [phone] 01/30/2023, 9:51 AM  Clinical Narrative:   HF CSW called to schedule pts follow up hospital follow up appointment for Thursday, February 07, 2023 at 1:30 PM.   PCP office requested that pts dc summary notes be faxed to [phone] .   TOC will continue following.          Expected Discharge Plan and Services                                               Social Determinants of Health (SDOH) Interventions SDOH Screenings   Food Insecurity: No Food Insecurity (01/21/2023)  Housing: Low Risk  (01/21/2023)  Transportation Needs: No Transportation Needs (01/21/2023)  Utilities: Not At Risk (01/21/2023)  Depression (PHQ2-9): Medium Risk (10/27/2019)  Financial Resource Strain: Low Risk  (03/28/2021)   Received from [facility]  Tobacco Use: Medium Risk (01/21/2023)    Readmission Risk Interventions    12/21/2022    9:17 AM 10/20/2022    2:15 PM 10/04/2022    9:36 AM  Readmission Risk Prevention Plan  Transportation Screening Complete Complete Complete  Medication Review Oceanographer) Complete Complete Complete  PCP or Specialist appointment within 3-5 days of discharge  Not Complete   HRI or Home Care Consult Complete Complete Complete  SW Recovery Care/Counseling Consult Complete Complete Complete  Palliative Care Screening Not Applicable Not Applicable Not Applicable  Skilled Nursing Facility Not Applicable Not Applicable Not Applicable

## 2023-02-01 ENCOUNTER — Emergency Department (HOSPITAL_COMMUNITY): Payer: Medicare HMO

## 2023-02-01 ENCOUNTER — Encounter (HOSPITAL_COMMUNITY): Payer: Self-pay

## 2023-02-01 ENCOUNTER — Other Ambulatory Visit: Payer: Self-pay

## 2023-02-01 ENCOUNTER — Inpatient Hospital Stay (HOSPITAL_COMMUNITY)
Admission: EM | Admit: 2023-02-01 | Discharge: 2023-02-08 | DRG: 442 | Disposition: A | Discharge status: Home Health | Payer: Medicare HMO | Attending: Family Medicine | Admitting: Family Medicine

## 2023-02-01 DIAGNOSIS — E872 Acidosis, unspecified: Secondary | ICD-10-CM | POA: Diagnosis present

## 2023-02-01 DIAGNOSIS — E1122 Type 2 diabetes mellitus with diabetic chronic kidney disease: Secondary | ICD-10-CM | POA: Diagnosis present

## 2023-02-01 DIAGNOSIS — Z79890 Hormone replacement therapy (postmenopausal): Secondary | ICD-10-CM

## 2023-02-01 DIAGNOSIS — K219 Gastro-esophageal reflux disease without esophagitis: Secondary | ICD-10-CM | POA: Diagnosis not present

## 2023-02-01 DIAGNOSIS — R404 Transient alteration of awareness: Secondary | ICD-10-CM | POA: Diagnosis not present

## 2023-02-01 DIAGNOSIS — F418 Other specified anxiety disorders: Secondary | ICD-10-CM | POA: Diagnosis not present

## 2023-02-01 DIAGNOSIS — I959 Hypotension, unspecified: Secondary | ICD-10-CM | POA: Diagnosis not present

## 2023-02-01 DIAGNOSIS — R4182 Altered mental status, unspecified: Secondary | ICD-10-CM | POA: Diagnosis not present

## 2023-02-01 DIAGNOSIS — E039 Hypothyroidism, unspecified: Secondary | ICD-10-CM | POA: Diagnosis not present

## 2023-02-01 DIAGNOSIS — Z800 Family history of malignant neoplasm of digestive organs: Secondary | ICD-10-CM

## 2023-02-01 DIAGNOSIS — E876 Hypokalemia: Secondary | ICD-10-CM | POA: Diagnosis not present

## 2023-02-01 DIAGNOSIS — K7682 Hepatic encephalopathy: Secondary | ICD-10-CM | POA: Diagnosis present

## 2023-02-01 DIAGNOSIS — D638 Anemia in other chronic diseases classified elsewhere: Secondary | ICD-10-CM | POA: Diagnosis present

## 2023-02-01 DIAGNOSIS — J449 Chronic obstructive pulmonary disease, unspecified: Secondary | ICD-10-CM | POA: Diagnosis not present

## 2023-02-01 DIAGNOSIS — Z8249 Family history of ischemic heart disease and other diseases of the circulatory system: Secondary | ICD-10-CM

## 2023-02-01 DIAGNOSIS — G238 Other specified degenerative diseases of basal ganglia: Secondary | ICD-10-CM | POA: Diagnosis not present

## 2023-02-01 DIAGNOSIS — R000 Tachycardia, unspecified: Secondary | ICD-10-CM

## 2023-02-01 DIAGNOSIS — I10 Essential (primary) hypertension: Secondary | ICD-10-CM | POA: Diagnosis not present

## 2023-02-01 DIAGNOSIS — I5032 Chronic diastolic (congestive) heart failure: Secondary | ICD-10-CM | POA: Diagnosis present

## 2023-02-01 DIAGNOSIS — E11649 Type 2 diabetes mellitus with hypoglycemia without coma: Secondary | ICD-10-CM | POA: Diagnosis present

## 2023-02-01 DIAGNOSIS — R410 Disorientation, unspecified: Secondary | ICD-10-CM | POA: Diagnosis not present

## 2023-02-01 DIAGNOSIS — Z9049 Acquired absence of other specified parts of digestive tract: Secondary | ICD-10-CM

## 2023-02-01 DIAGNOSIS — R7989 Other specified abnormal findings of blood chemistry: Secondary | ICD-10-CM | POA: Diagnosis present

## 2023-02-01 DIAGNOSIS — Z66 Do not resuscitate: Secondary | ICD-10-CM | POA: Diagnosis present

## 2023-02-01 DIAGNOSIS — Z79899 Other long term (current) drug therapy: Secondary | ICD-10-CM

## 2023-02-01 DIAGNOSIS — K7581 Nonalcoholic steatohepatitis (NASH): Principal | ICD-10-CM | POA: Diagnosis present

## 2023-02-01 DIAGNOSIS — F32A Depression, unspecified: Secondary | ICD-10-CM | POA: Diagnosis present

## 2023-02-01 DIAGNOSIS — J4489 Other specified chronic obstructive pulmonary disease: Secondary | ICD-10-CM | POA: Diagnosis present

## 2023-02-01 DIAGNOSIS — K7460 Unspecified cirrhosis of liver: Secondary | ICD-10-CM | POA: Diagnosis present

## 2023-02-01 DIAGNOSIS — D696 Thrombocytopenia, unspecified: Secondary | ICD-10-CM | POA: Diagnosis present

## 2023-02-01 DIAGNOSIS — Z7984 Long term (current) use of oral hypoglycemic drugs: Secondary | ICD-10-CM

## 2023-02-01 DIAGNOSIS — R918 Other nonspecific abnormal finding of lung field: Secondary | ICD-10-CM | POA: Diagnosis not present

## 2023-02-01 DIAGNOSIS — E119 Type 2 diabetes mellitus without complications: Secondary | ICD-10-CM

## 2023-02-01 DIAGNOSIS — Z87891 Personal history of nicotine dependence: Secondary | ICD-10-CM

## 2023-02-01 DIAGNOSIS — I130 Hypertensive heart and chronic kidney disease with heart failure and stage 1 through stage 4 chronic kidney disease, or unspecified chronic kidney disease: Secondary | ICD-10-CM | POA: Diagnosis present

## 2023-02-01 DIAGNOSIS — N1832 Chronic kidney disease, stage 3b: Secondary | ICD-10-CM | POA: Diagnosis present

## 2023-02-01 DIAGNOSIS — F419 Anxiety disorder, unspecified: Secondary | ICD-10-CM | POA: Diagnosis present

## 2023-02-01 DIAGNOSIS — Z743 Need for continuous supervision: Secondary | ICD-10-CM | POA: Diagnosis not present

## 2023-02-01 LAB — URINALYSIS, W/ REFLEX TO CULTURE (INFECTION SUSPECTED)
Bacteria, UA: NONE SEEN
Bilirubin Urine: NEGATIVE
Glucose, UA: NEGATIVE mg/dL
Ketones, ur: NEGATIVE mg/dL
Leukocytes,Ua: NEGATIVE
Nitrite: NEGATIVE
Protein, ur: NEGATIVE mg/dL
RBC / HPF: >50 RBC/hpf (ref 0–5)
Specific Gravity, Urine: 1.012 (ref 1.005–1.030)
pH: 5 (ref 5.0–8.0)

## 2023-02-01 LAB — LACTIC ACID, PLASMA
Lactic Acid, Venous: 3.2 mmol/L (ref 0.5–1.9)
Lactic Acid, Venous: 2.7 mmol/L (ref 0.5–1.9)
Lactic Acid, Venous: 2.7 mmol/L (ref 0.5–1.9)

## 2023-02-01 LAB — COMPREHENSIVE METABOLIC PANEL
ALT: 32 U/L (ref 0–44)
AST: 79 U/L — ABNORMAL HIGH (ref 15–41)
Albumin: 3.3 g/dL — ABNORMAL LOW (ref 3.5–5.0)
Alkaline Phosphatase: 73 U/L (ref 38–126)
Anion gap: 14 (ref 5–15)
BUN: 47 mg/dL — ABNORMAL HIGH (ref 8–23)
CO2: 27 mmol/L (ref 22–32)
Calcium: 9.7 mg/dL (ref 8.9–10.3)
Chloride: 92 mmol/L — ABNORMAL LOW (ref 98–111)
Creatinine, Ser: 2.71 mg/dL — ABNORMAL HIGH (ref 0.61–1.24)
GFR, Estimated: 25 mL/min — ABNORMAL LOW (ref >60)
Glucose, Bld: 138 mg/dL — ABNORMAL HIGH (ref 70–99)
Potassium: 3.9 mmol/L (ref 3.5–5.1)
Sodium: 133 mmol/L — ABNORMAL LOW (ref 135–145)
Total Bilirubin: 6.9 mg/dL — ABNORMAL HIGH (ref <1.2)
Total Protein: 7.2 g/dL (ref 6.5–8.1)

## 2023-02-01 LAB — BLOOD GAS, VENOUS
Acid-Base Excess: 4.4 mmol/L — ABNORMAL HIGH (ref 0.0–2.0)
Bicarbonate: 26.2 mmol/L (ref 20.0–28.0)
Drawn by: 1517
O2 Saturation: 98.4 %
Patient temperature: 36.5
pCO2, Ven: 29 mm[Hg] — ABNORMAL LOW (ref 44–60)
pH, Ven: 7.56 — ABNORMAL HIGH (ref 7.25–7.43)
pO2, Ven: 76 mm[Hg] — ABNORMAL HIGH (ref 32–45)

## 2023-02-01 LAB — PROTIME-INR
INR: 1.7 — ABNORMAL HIGH (ref 0.8–1.2)
Prothrombin Time: 19.8 s — ABNORMAL HIGH (ref 11.4–15.2)

## 2023-02-01 LAB — TROPONIN I (HIGH SENSITIVITY): Troponin I (High Sensitivity): 16 ng/L (ref <18)

## 2023-02-01 LAB — AMMONIA: Ammonia: 108 umol/L — ABNORMAL HIGH (ref 9–35)

## 2023-02-01 LAB — CBC
HCT: 31.3 % — ABNORMAL LOW (ref 39.0–52.0)
Hemoglobin: 10.5 g/dL — ABNORMAL LOW (ref 13.0–17.0)
MCH: 28.5 pg (ref 26.0–34.0)
MCHC: 33.5 g/dL (ref 30.0–36.0)
MCV: 84.8 fL (ref 80.0–100.0)
Platelets: 59 10*3/uL — ABNORMAL LOW (ref 150–400)
RBC: 3.69 MIL/uL — ABNORMAL LOW (ref 4.22–5.81)
RDW: 16.4 % — ABNORMAL HIGH (ref 11.5–15.5)
WBC: 8.8 10*3/uL (ref 4.0–10.5)
nRBC: 0 % (ref 0.0–0.2)

## 2023-02-01 LAB — CK: Total CK: 948 U/L — ABNORMAL HIGH (ref 49–397)

## 2023-02-01 LAB — CBG MONITORING, ED: Glucose-Capillary: 132 mg/dL — ABNORMAL HIGH (ref 70–99)

## 2023-02-01 LAB — PROCALCITONIN: Procalcitonin: 0.38 ng/mL

## 2023-02-01 MED ORDER — GABAPENTIN 100 MG PO CAPS: 100.0000 mg | ORAL_CAPSULE | Freq: Every day | ORAL | Status: DC | PRN

## 2023-02-01 MED ORDER — ONDANSETRON HCL 4 MG PO TABS: 4.0000 mg | ORAL_TABLET | Freq: Four times a day (QID) | ORAL | Status: DC | PRN

## 2023-02-01 MED ORDER — LORAZEPAM 2 MG/ML IJ SOLN
2.0000 mg | Freq: Once | INTRAMUSCULAR | Status: AC
  Administered 2023-02-01: 2 mg via INTRAVENOUS
  Filled 2023-02-01: qty 1

## 2023-02-01 MED ORDER — LORAZEPAM 2 MG/ML IJ SOLN
1.0000 mg | Freq: Once | INTRAMUSCULAR | Status: AC
Start: 2023-02-01 — End: 2023-02-01
  Administered 2023-02-01: 1 mg via INTRAVENOUS
  Filled 2023-02-01: qty 1

## 2023-02-01 MED ORDER — LACTULOSE 10 GM/15ML PO SOLN: 20.0000 g | Freq: Two times a day (BID) | ORAL | Status: DC

## 2023-02-01 MED ORDER — HALOPERIDOL LACTATE 5 MG/ML IJ SOLN
5.0000 mg | Freq: Once | INTRAMUSCULAR | Status: AC
  Administered 2023-02-01: 5 mg via INTRAVENOUS
  Filled 2023-02-01: qty 1

## 2023-02-01 MED ORDER — SERTRALINE HCL 50 MG PO TABS
100.0000 mg | ORAL_TABLET | Freq: Every day | ORAL | Status: DC
  Administered 2023-02-04 – 2023-02-08 (×5): 100 mg via ORAL
  Filled 2023-02-01 (×5): qty 2

## 2023-02-01 MED ORDER — DAPAGLIFLOZIN PROPANEDIOL 10 MG PO TABS: 10.0000 mg | ORAL_TABLET | Freq: Every day | ORAL | Status: DC

## 2023-02-01 MED ORDER — CHLORHEXIDINE GLUCONATE CLOTH 2 % EX PADS
6.0000 | MEDICATED_PAD | Freq: Every day | CUTANEOUS | Status: DC
  Administered 2023-02-02 – 2023-02-08 (×7): 6 via TOPICAL

## 2023-02-01 MED ORDER — LACTULOSE ENEMA
300.0000 mL | Freq: Once | ORAL | Status: AC
  Administered 2023-02-01: 300 mL via RECTAL
  Filled 2023-02-01: qty 300

## 2023-02-01 MED ORDER — LACTATED RINGERS IV BOLUS
1000.0000 mL | Freq: Once | INTRAVENOUS | Status: AC
  Administered 2023-02-01: 1000 mL via INTRAVENOUS

## 2023-02-01 MED ORDER — MAGNESIUM SULFATE 2 GM/50ML IV SOLN
2.0000 g | Freq: Once | INTRAVENOUS | Status: AC
  Administered 2023-02-01: 2 g via INTRAVENOUS
  Filled 2023-02-01: qty 50

## 2023-02-01 MED ORDER — LEVOTHYROXINE SODIUM 88 MCG PO TABS: 88.0000 ug | ORAL_TABLET | Freq: Every day | ORAL | Status: DC

## 2023-02-01 MED ORDER — DIPHENHYDRAMINE HCL 25 MG PO CAPS
50.0000 mg | ORAL_CAPSULE | Freq: Once | ORAL | Status: DC
  Filled 2023-02-01: qty 2

## 2023-02-01 MED ORDER — ACETAMINOPHEN 650 MG RE SUPP: 650.0000 mg | Freq: Four times a day (QID) | RECTAL | Status: DC | PRN

## 2023-02-01 MED ORDER — ACETAMINOPHEN 325 MG PO TABS
650.0000 mg | ORAL_TABLET | Freq: Four times a day (QID) | ORAL | Status: DC | PRN
  Administered 2023-02-05 – 2023-02-07 (×4): 650 mg via ORAL
  Filled 2023-02-01 (×4): qty 2

## 2023-02-01 MED ORDER — ORAL CARE MOUTH RINSE: 15.0000 mL | OROMUCOSAL | Status: DC | PRN

## 2023-02-01 MED ORDER — ONDANSETRON HCL 4 MG/2ML IJ SOLN: 4.0000 mg | Freq: Four times a day (QID) | INTRAMUSCULAR | Status: DC | PRN

## 2023-02-01 MED ORDER — DROPERIDOL 2.5 MG/ML IJ SOLN
2.5000 mg | Freq: Once | INTRAMUSCULAR | Status: AC
  Administered 2023-02-01: 2.5 mg via INTRAVENOUS
  Filled 2023-02-01: qty 2

## 2023-02-01 MED ORDER — DIPHENHYDRAMINE HCL 50 MG/ML IJ SOLN
50.0000 mg | Freq: Once | INTRAMUSCULAR | Status: AC
  Administered 2023-02-01: 50 mg via INTRAVENOUS
  Filled 2023-02-01: qty 1

## 2023-02-01 MED ORDER — PANTOPRAZOLE SODIUM 40 MG PO TBEC: 40.0000 mg | DELAYED_RELEASE_TABLET | Freq: Every day | ORAL | Status: DC

## 2023-02-01 MED ORDER — SPIRONOLACTONE 100 MG PO TABS: 100.0000 mg | ORAL_TABLET | Freq: Every day | ORAL | Status: DC

## 2023-02-01 MED ORDER — LACTULOSE 10 GM/15ML PO SOLN: 20.0000 g | Freq: Once | ORAL | Status: DC

## 2023-02-01 NOTE — ED Notes (Signed)
Family called to check in on patient ; gave updates. Will call back tomorrow

## 2023-02-01 NOTE — ED Provider Notes (Signed)
[place] EMERGENCY DEPARTMENT AT [facility] Provider Note   CSN: [id number] Arrival date & time: 02/01/23  1352     History {Add pertinent medical, surgical, social history, OB history to HPI:1} Chief Complaint  Patient presents with   Altered Mental Status   HPI Zayin Windholz is a 67 y.o. male with history of cirrhosis of the liver secondary to [facility], CHF, CKD, COPD presenting for altered mental status.  Per EMS he was found on the floor by an emergency contact earlier today.  Patient was yelling that he was "in pain.  Patient also noted to swell himself on the floor.  Patient notably agitated and stating he is in pain but unable to specify where the pain is.  During my encounter with him, he was yelling and praying and reluctant to answer most questions.  Did deny chest pain abdominal pain.   Altered Mental Status      Home Medications Prior to Admission medications   Medication Sig Start Date End Date Taking? Authorizing Provider  dapagliflozin propanediol (FARXIGA) 10 MG TABS tablet Take 1 tablet (10 mg total) by mouth daily before breakfast. 11/28/22  Yes Laurey Morale, MD  lactulose (CHRONULAC) 10 GM/15ML solution Take 30 mLs (20 g total) by mouth 2 (two) times daily. 01/30/23 03/01/23 Yes Zannie Cove, MD  levothyroxine (SYNTHROID) 88 MCG tablet Take 1 tablet (88 mcg total) by mouth daily before breakfast. 01/10/23  Yes Robbie Lis M, PA-C  pantoprazole (PROTONIX) 40 MG tablet Take 1 tablet (40 mg total) by mouth daily. 01/25/22  Yes Emokpae, Courage, MD  potassium chloride SA (KLOR-CON M) 20 MEQ tablet Take 2 tablets (40 mEq total) by mouth 2 (two) times daily. 07/25/22  Yes Laurey Morale, MD  sertraline (ZOLOFT) 100 MG tablet Take 100 mg by mouth daily. 09/24/22  Yes [provider]  spironolactone (ALDACTONE) 100 MG tablet Take 100 mg by mouth daily.   Yes [provider]  torsemide (DEMADEX) 20 MG tablet Take 4 tablets (80 mg total) by mouth  2 (two) times daily. 01/30/23 03/01/23 Yes Zannie Cove, MD  traZODone (DESYREL) 50 MG tablet Take 1 tablet (50 mg total) by mouth at bedtime. Patient taking differently: Take 100 mg by mouth at bedtime. 01/25/22  Yes Shon Hale, MD  acetaminophen (TYLENOL) 500 MG tablet Take 1,000 mg by mouth every 6 (six) hours as needed for moderate pain.    [provider]  albuterol (VENTOLIN HFA) 108 (90 Base) MCG/ACT inhaler Inhale 2 puffs into the lungs every 6 (six) hours as needed for wheezing or shortness of breath. 05/01/22   Oretha Milch, MD  gabapentin (NEURONTIN) 100 MG capsule Take 100 mg by mouth daily as needed (pain). 02/20/21   [provider]  hydrOXYzine (VISTARIL) 50 MG capsule Take 1 capsule (50 mg total) by mouth 3 (three) times daily as needed for anxiety or nausea. 01/25/22   Shon Hale, MD  lactose free nutrition (BOOST) LIQD Take 237 mLs by mouth every evening.    [provider]  ondansetron (ZOFRAN) 4 MG tablet Take 4 mg by mouth 2 (two) times daily as needed for nausea or vomiting. 10/28/22   [provider]  polyethylene glycol (MIRALAX / GLYCOLAX) 17 g packet Take 17 g by mouth daily as needed for mild constipation.    [provider]      Allergies    Patient has no known allergies.    Review of Systems   See  HPI for pertinent positive   Physical Exam   Vitals:   02/01/23 1700 02/01/23 1830  BP: (!) 144/87 113/76  Pulse: (!) 101 (!) 146  Resp: 15   Temp:    SpO2: 98% 100%    CONSTITUTIONAL:  well-appearing, NAD, agitated, yelling, reluctant to answer questions NEURO: Able to open his eyes when asked, moving all extremities, able to answer simple yes or no questions, irritable. EYES: Bilateral scleral icterus, eyes equal and reactive ENT/NECK:  Supple, no stridor  CARDIO:  regular rate and rhythm, appears well-perfused  PULM:  No respiratory distress, CTAB GI/GU:  non-distended, soft non tender MSK/SPINE:  No  gross deformities, no edema, moves all extremities  SKIN:  no rash, atraumatic  *Additional and/or pertinent findings included in MDM below   ED Results / Procedures / Treatments   Labs (all labs ordered are listed, but only abnormal results are displayed) Labs Reviewed  COMPREHENSIVE METABOLIC PANEL - Abnormal; Notable for the following components:      Result Value   Sodium 133 (*)    Chloride 92 (*)    Glucose, Bld 138 (*)    BUN 47 (*)    Creatinine, Ser 2.71 (*)    Albumin 3.3 (*)    AST 79 (*)    Total Bilirubin 6.9 (*)    GFR, Estimated 25 (*)    All other components within normal limits  CBC - Abnormal; Notable for the following components:   RBC 3.69 (*)    Hemoglobin 10.5 (*)    HCT 31.3 (*)    RDW 16.4 (*)    Platelets 59 (*)    All other components within normal limits  AMMONIA - Abnormal; Notable for the following components:   Ammonia 108 (*)    All other components within normal limits  LACTIC ACID, PLASMA - Abnormal; Notable for the following components:   Lactic Acid, Venous 3.2 (*)    All other components within normal limits  LACTIC ACID, PLASMA - Abnormal; Notable for the following components:   Lactic Acid, Venous 2.7 (*)    All other components within normal limits  CK - Abnormal; Notable for the following components:   Total CK 948 (*)    All other components within normal limits  URINALYSIS, W/ REFLEX TO CULTURE (INFECTION SUSPECTED) - Abnormal; Notable for the following components:   Hgb urine dipstick MODERATE (*)    All other components within normal limits  PROTIME-INR - Abnormal; Notable for the following components:   Prothrombin Time 19.8 (*)    INR 1.7 (*)    All other components within normal limits  LACTIC ACID, PLASMA - Abnormal; Notable for the following components:   Lactic Acid, Venous 2.7 (*)    All other components within normal limits  CBG MONITORING, ED - Abnormal; Notable for the following components:   Glucose-Capillary  132 (*)    All other components within normal limits  BLOOD GAS, VENOUS  TROPONIN I (HIGH SENSITIVITY)    EKG EKG Interpretation Date/Time:  Friday February 01 2023 14:14:45 EST Ventricular Rate:  96 PR Interval:  173 QRS Duration:  107 QT Interval:  404 QTC Calculation: 511 R Axis:   50  Text Interpretation: Sinus rhythm Consider right atrial enlargement Borderline ST elevation, inferior leads Prolonged QT interval Artifact in lead(s) I III aVR aVL aVF V2 and baseline wander in lead(s) II III aVF Confirmed by Tanda Rockers ([id number]) on 02/01/2023 6:36:37 PM  Radiology CT Head Wo Contrast  Result Date: 02/01/2023 CLINICAL DATA:  Altered mental status, fall EXAM: CT HEAD WITHOUT CONTRAST TECHNIQUE: Contiguous axial images were obtained from the base of the skull through the vertex without intravenous contrast. RADIATION DOSE REDUCTION: This exam was performed according to the departmental dose-optimization program which includes automated exposure control, adjustment of the mA and/or kV according to patient size and/or use of iterative reconstruction technique. COMPARISON:  10/21/2022 FINDINGS: Brain: No evidence of acute infarction, hemorrhage, mass, mass effect, or midline shift. No hydrocephalus or extra-axial fluid collection. Basal ganglia calcifications. Normal cerebral volume. Normal pituitary and craniocervical junction. Vascular: No hyperdense vessel. Skull: Negative for fracture or focal lesion. Sinuses/Orbits: Small mucous retention cysts in the maxillary sinuses. Mild mucosal thickening in the ethmoid air cells. No acute finding in the orbits. Other: The mastoid air cells are well aerated. IMPRESSION: No acute intracranial process. Electronically Signed   By: Wiliam Ke M.D.   On: 02/01/2023 18:44   DG Chest Portable 1 View  Result Date: 02/01/2023 CLINICAL DATA:  Altered mental status and fall EXAM: PORTABLE CHEST 1 VIEW COMPARISON:  Chest radiograph dated 01/13/2023 FINDINGS:  Normal lung volumes. Left basilar patchy opacity. No pleural effusion or pneumothorax. The heart size and mediastinal contours are within normal limits. No radiographic finding of acute displaced fracture. IMPRESSION: 1. Left basilar patchy opacity, likely atelectasis. 2.  No radiographic finding of acute displaced fracture. Electronically Signed   By: Agustin Cree M.D.   On: 02/01/2023 17:07    Procedures .Critical Care  Performed by: Gareth Eagle, PA-C Authorized by: Gareth Eagle, PA-C   Critical care provider statement:    Critical care time (minutes):  30   Critical care was necessary to treat or prevent imminent or life-threatening deterioration of the following conditions:  Renal failure, dehydration and hepatic failure   Critical care was time spent personally by me on the following activities:  Development of treatment plan with patient or surrogate, discussions with consultants, evaluation of patient's response to treatment, examination of patient, ordering and review of laboratory studies, ordering and review of radiographic studies, ordering and performing treatments and interventions, pulse oximetry, re-evaluation of patient's condition and review of old charts   {Document cardiac monitor, telemetry assessment procedure when appropriate:1}  Medications Ordered in ED Medications  lactated ringers bolus 1,000 mL (has no administration in time range)  magnesium sulfate IVPB 2 g 50 mL (2 g Intravenous New Bag/Given 02/01/23 1949)  LORazepam (ATIVAN) injection 1 mg (1 mg Intravenous Given 02/01/23 1426)  haloperidol lactate (HALDOL) injection 5 mg (5 mg Intravenous Given 02/01/23 1510)  lactated ringers bolus 1,000 mL (1,000 mLs Intravenous Bolus 02/01/23 1510)  lactulose (CHRONULAC) enema 200 gm (300 mLs Rectal Given 02/01/23 1801)  LORazepam (ATIVAN) injection 2 mg (2 mg Intravenous Given 02/01/23 1901)  diphenhydrAMINE (BENADRYL) injection 50 mg (50 mg Intravenous Given 02/01/23 1914)   droperidol (INAPSINE) 2.5 MG/ML injection 2.5 mg (2.5 mg Intravenous Given 02/01/23 1944)    ED Course/ Medical Decision Making/ A&P Clinical Course as of 02/01/23 2011  Fri Feb 01, 2023  1515 Height: 6\' 2"  (188 cm) [JR]    Clinical Course User Index [JR] Gareth Eagle, PA-C   {   Click here for ABCD2, HEART and other calculatorsREFRESH Note before signing :1}                              Medical Decision Making Amount and/or Complexity  of Data Reviewed Labs: ordered. Radiology: ordered.  Risk Prescription drug management. Decision regarding hospitalization.   Initial Impression and Ddx 67 year old well-appearing male notably irritable and encephalopathic on arrival.  Exam notable for bilateral scleral icterus and agitation and confusion.  DDx includes stroke, hepatic encephalopathy, sepsis, PE, electrolyte derangement, rhabdomyolysis, dehydration. Patient PMH that increases complexity of ED encounter:  history of cirrhosis of the liver secondary to [facility], CHF, CKD, COPD  Interpretation of Diagnostics - I independent reviewed and interpreted the labs as followed: Hyperammonemia (108), lactic acidosis, elevated CK, reduced GFR  - I independently visualized the following imaging with scope of interpretation limited to determining acute life threatening conditions related to emergency care: CT head , which revealed no acute findings, chest x-ray revealed left basilar opacity suggestive of atelectasis  - I personally reviewed and interpreted EKG which revealed sinus rhythm  Patient Reassessment and Ultimate Disposition/Management On serial reassessments, patient was persistently agitated and yelling at times.  treated initially with Ativan 1 mg.  Later gave Haldol followed by 2 mg of Ativan and IV Benadryl.  Patient did become notably tachycardic.  Suspect this is a combination of ongoing dehydration and agitation.  Patient remains afebrile and patient initially had no chest pain  and negative initial troponin making PE unlikely.  CT scan was negative for acute findings.  Given his high ammonia levels, scleral icterus history of cirrhosis and encephalopathic presentation, suspect hepatic encephalopathy as etiology of his symptoms today.  Also workup does not suggest infection.  Admitted to hospital service with Dr. Carren Rang.  Lactic acidosis did improve with fluids.  Patient management required discussion with the following services or consulting groups:  Hospitalist Service  Complexity of Problems Addressed Acute complicated illness or Injury  Additional Data Reviewed and Analyzed Further history obtained from: Further history from spouse/family member, Past medical history and medications listed in the EMR, and Prior ED visit notes  Patient Encounter Risk Assessment Consideration of hospitalization   {Document critical care time when appropriate:1} {Document review of labs and clinical decision tools ie heart score, Chads2Vasc2 etc:1}  {Document your independent review of radiology images, and any outside records:1} {Document your discussion with family members, caretakers, and with consultants:1} {Document social determinants of health affecting pt's care:1} {Document your decision making why or why not admission, treatments were needed:1} Final Clinical Impression(s) / ED Diagnoses Final diagnoses:  Altered mental status, unspecified altered mental status type  Tachycardia    Rx / DC Orders ED Discharge Orders     None

## 2023-02-01 NOTE — ED Triage Notes (Signed)
Pt BIB EMS for AMS/ fall. Per EMS PCP was unable to get ahold of the pt and called pt's emergency contact. Per EMS pt was found in the floor by emergency contact "family friend" and has been talking out of his. Pt yells "I'm in pain" when asked what is wrong but will not specify where pain is at. Pt has soiled himself while in the floor.

## 2023-02-01 NOTE — ED Notes (Signed)
Pt not calm enough to obtain clear EKG, pt moving too much and not cooperating with staff to put monitor on.

## 2023-02-01 NOTE — H&P (Signed)
History and Physical    Patient: Daniel Crawford [id number] DOB: 1956/03/07 DOA: 02/01/2023 DOS: the patient was seen and examined on 02/01/2023 PCP: [name], MD  Patient coming from: {Point_of_Origin:[address]}  Chief Complaint:  Chief Complaint  Patient presents with   Altered Mental Status   HPI: Daniel Crawford is a 67 y.o. male with medical history significant of ***  Review of Systems: {ROS_Text:26778} Past Medical History:  Diagnosis Date   Acute pancreatitis 10/02/2022   Anasarca 01/23/2022   Anemia    Anxiety    Asthma    CHF (congestive heart failure) (HCC)    CKD (chronic kidney disease)    D-dimer, elevated 01/22/2022   Decompensation of cirrhosis of liver (HCC) 05/21/2022   Depression    Diabetes mellitus without complication (HCC)    Dyspnea    Hepatic encephalopathy (HCC) 07/10/2022   Hypertension    Hypothyroidism    Liver cirrhosis secondary to NASH (HCC)    NASH (nonalcoholic steatohepatitis)    Other ascites 06/19/2019   Pancytopenia (HCC) 05/21/2022   Pre-diabetes    Spleen enlarged    Thrombocytopenia (HCC)    Past Surgical History:  Procedure Laterality Date   Bilateral hernia surgery     2006, 2007   CHOLECYSTECTOMY     2016    COLONOSCOPY WITH PROPOFOL N/A 06/28/2021   Procedure: COLONOSCOPY WITH PROPOFOL;  Surgeon: Malissa Hippo, MD;  Location: [facility];  Service: Endoscopy;  Laterality: N/A;  1020   ESOPHAGEAL DILATION N/A 11/06/2019   Procedure: ESOPHAGEAL DILATION;  Surgeon: Dolores Frame, MD;  Location: [facility];  Service: Gastroenterology;  Laterality: N/A;   ESOPHAGOGASTRODUODENOSCOPY (EGD) WITH PROPOFOL N/A 11/06/2019   Procedure: ESOPHAGOGASTRODUODENOSCOPY (EGD) WITH PROPOFOL;  Surgeon: Dolores Frame, MD;  Location: [facility];  Service: Gastroenterology;  Laterality: N/A;  1045   IR RADIOLOGIST EVAL & MGMT  02/19/2022   LAPAROSCOPIC ASSISTED VENTRAL HERNIA REPAIR     Polyp removed     in January  of 2018.    POLYPECTOMY  06/28/2021   Procedure: POLYPECTOMY INTESTINAL;  Surgeon: Malissa Hippo, MD;  Location: [facility];  Service: Endoscopy;;   RIGHT HEART CATH N/A 01/08/2022   Procedure: RIGHT HEART CATH;  Surgeon: Corky Crafts, MD;  Location: [facility];  Service: Cardiovascular;  Laterality: N/A;   RIGHT HEART CATH N/A 01/29/2023   Procedure: RIGHT HEART CATH;  Surgeon: Laurey Morale, MD;  Location: [facility] INVASIVE CV LAB;  Service: Cardiovascular;  Laterality: N/A;   Social History:  reports that he quit smoking about 7 years ago. His smoking use included cigarettes. He started smoking about 27 years ago. He has a 20 pack-year smoking history. He has been exposed to tobacco smoke. He has never used smokeless tobacco. He reports that he does not drink alcohol and does not use drugs.  No Known Allergies  Family History  Problem Relation Age of Onset   Liver cancer Mother    Heart disease Mother    Aneurysm Sister     Prior to Admission medications   Medication Sig Start Date End Date Taking? Authorizing Provider  dapagliflozin propanediol (FARXIGA) 10 MG TABS tablet Take 1 tablet (10 mg total) by mouth daily before breakfast. 11/28/22  Yes Laurey Morale, MD  lactulose (CHRONULAC) 10 GM/15ML solution Take 30 mLs (20 g total) by mouth 2 (two) times daily. 01/30/23 03/01/23 Yes Zannie Cove, MD  levothyroxine (SYNTHROID) 88 MCG tablet Take 1 tablet (  88 mcg total) by mouth daily before breakfast. 01/10/23  Yes Sharol Harness, Brittainy M, PA-C  pantoprazole (PROTONIX) 40 MG tablet Take 1 tablet (40 mg total) by mouth daily. 01/25/22  Yes Emokpae, Courage, MD  potassium chloride SA (KLOR-CON M) 20 MEQ tablet Take 2 tablets (40 mEq total) by mouth 2 (two) times daily. 07/25/22  Yes Laurey Morale, MD  sertraline (ZOLOFT) 100 MG tablet Take 100 mg by mouth daily. 09/24/22  Yes [provider]  spironolactone (ALDACTONE) 100 MG tablet Take 100 mg by mouth daily.   Yes  [provider]  torsemide (DEMADEX) 20 MG tablet Take 4 tablets (80 mg total) by mouth 2 (two) times daily. 01/30/23 03/01/23 Yes Zannie Cove, MD  traZODone (DESYREL) 50 MG tablet Take 1 tablet (50 mg total) by mouth at bedtime. Patient taking differently: Take 100 mg by mouth at bedtime. 01/25/22  Yes Shon Hale, MD  acetaminophen (TYLENOL) 500 MG tablet Take 1,000 mg by mouth every 6 (six) hours as needed for moderate pain.    [provider]  albuterol (VENTOLIN HFA) 108 (90 Base) MCG/ACT inhaler Inhale 2 puffs into the lungs every 6 (six) hours as needed for wheezing or shortness of breath. 05/01/22   Oretha Milch, MD  gabapentin (NEURONTIN) 100 MG capsule Take 100 mg by mouth daily as needed (pain). 02/20/21   [provider]  hydrOXYzine (VISTARIL) 50 MG capsule Take 1 capsule (50 mg total) by mouth 3 (three) times daily as needed for anxiety or nausea. 01/25/22   Shon Hale, MD  lactose free nutrition (BOOST) LIQD Take 237 mLs by mouth every evening.    [provider]  ondansetron (ZOFRAN) 4 MG tablet Take 4 mg by mouth 2 (two) times daily as needed for nausea or vomiting. 10/28/22   [provider]  polyethylene glycol (MIRALAX / GLYCOLAX) 17 g packet Take 17 g by mouth daily as needed for mild constipation.    [provider]    Physical Exam: Vitals:   02/01/23 1830 02/01/23 1930 02/01/23 2000 02/01/23 2126  BP: 113/76   (!) 96/55  Pulse: (!) 146 (!) 139 (!) 150 (!) 148  Resp:  17  (!) 22  Temp:    99.5 F (37.5 C)  TempSrc:    Axillary  SpO2: 100% 99% 100% 96%  Weight:      Height:       *** Data Reviewed: {Tip this will not be part of the note when signed- Document your independent interpretation of telemetry tracing, EKG, lab, Radiology test or any other diagnostic tests. Add any new diagnostic test ordered today. (Optional):26781} {Results:26384}  Assessment and Plan: No notes have been filed under this  hospital service. Service: Hospitalist     Advance Care Planning:   Code Status: Limited: Do not attempt resuscitation (DNR) -DNR-LIMITED -Do Not Intubate/DNI  ***  Consults: ***  Family Communication: ***  Severity of Illness: {Observation/Inpatient:21159}  Author: Lilyan Gilford, DO 02/01/2023 9:54 PM  For on call review [email or web address].

## 2023-02-02 DIAGNOSIS — D696 Thrombocytopenia, unspecified: Secondary | ICD-10-CM | POA: Diagnosis not present

## 2023-02-02 DIAGNOSIS — F32A Depression, unspecified: Secondary | ICD-10-CM | POA: Diagnosis not present

## 2023-02-02 DIAGNOSIS — K219 Gastro-esophageal reflux disease without esophagitis: Secondary | ICD-10-CM | POA: Diagnosis not present

## 2023-02-02 DIAGNOSIS — J4489 Other specified chronic obstructive pulmonary disease: Secondary | ICD-10-CM | POA: Diagnosis not present

## 2023-02-02 DIAGNOSIS — R7989 Other specified abnormal findings of blood chemistry: Secondary | ICD-10-CM | POA: Diagnosis not present

## 2023-02-02 DIAGNOSIS — I10 Essential (primary) hypertension: Secondary | ICD-10-CM

## 2023-02-02 DIAGNOSIS — E039 Hypothyroidism, unspecified: Secondary | ICD-10-CM | POA: Diagnosis not present

## 2023-02-02 DIAGNOSIS — Z79890 Hormone replacement therapy (postmenopausal): Secondary | ICD-10-CM | POA: Diagnosis not present

## 2023-02-02 DIAGNOSIS — Z66 Do not resuscitate: Secondary | ICD-10-CM | POA: Diagnosis not present

## 2023-02-02 DIAGNOSIS — I5032 Chronic diastolic (congestive) heart failure: Secondary | ICD-10-CM | POA: Diagnosis not present

## 2023-02-02 DIAGNOSIS — N1832 Chronic kidney disease, stage 3b: Secondary | ICD-10-CM

## 2023-02-02 DIAGNOSIS — F418 Other specified anxiety disorders: Secondary | ICD-10-CM

## 2023-02-02 DIAGNOSIS — K7460 Unspecified cirrhosis of liver: Secondary | ICD-10-CM

## 2023-02-02 DIAGNOSIS — D638 Anemia in other chronic diseases classified elsewhere: Secondary | ICD-10-CM | POA: Diagnosis not present

## 2023-02-02 DIAGNOSIS — I130 Hypertensive heart and chronic kidney disease with heart failure and stage 1 through stage 4 chronic kidney disease, or unspecified chronic kidney disease: Secondary | ICD-10-CM | POA: Diagnosis not present

## 2023-02-02 DIAGNOSIS — E872 Acidosis, unspecified: Secondary | ICD-10-CM | POA: Diagnosis not present

## 2023-02-02 DIAGNOSIS — Z8249 Family history of ischemic heart disease and other diseases of the circulatory system: Secondary | ICD-10-CM | POA: Diagnosis not present

## 2023-02-02 DIAGNOSIS — E11649 Type 2 diabetes mellitus with hypoglycemia without coma: Secondary | ICD-10-CM | POA: Diagnosis not present

## 2023-02-02 DIAGNOSIS — J449 Chronic obstructive pulmonary disease, unspecified: Secondary | ICD-10-CM | POA: Diagnosis not present

## 2023-02-02 DIAGNOSIS — Z87891 Personal history of nicotine dependence: Secondary | ICD-10-CM | POA: Diagnosis not present

## 2023-02-02 DIAGNOSIS — F419 Anxiety disorder, unspecified: Secondary | ICD-10-CM | POA: Diagnosis not present

## 2023-02-02 DIAGNOSIS — K7581 Nonalcoholic steatohepatitis (NASH): Secondary | ICD-10-CM

## 2023-02-02 DIAGNOSIS — K7682 Hepatic encephalopathy: Secondary | ICD-10-CM | POA: Diagnosis not present

## 2023-02-02 DIAGNOSIS — E1122 Type 2 diabetes mellitus with diabetic chronic kidney disease: Secondary | ICD-10-CM | POA: Diagnosis not present

## 2023-02-02 DIAGNOSIS — Z79899 Other long term (current) drug therapy: Secondary | ICD-10-CM | POA: Diagnosis not present

## 2023-02-02 DIAGNOSIS — Z7984 Long term (current) use of oral hypoglycemic drugs: Secondary | ICD-10-CM | POA: Diagnosis not present

## 2023-02-02 DIAGNOSIS — Z800 Family history of malignant neoplasm of digestive organs: Secondary | ICD-10-CM | POA: Diagnosis not present

## 2023-02-02 DIAGNOSIS — E876 Hypokalemia: Secondary | ICD-10-CM | POA: Diagnosis not present

## 2023-02-02 LAB — CBC WITH DIFFERENTIAL/PLATELET
Abs Immature Granulocytes: 0.02 10*3/uL (ref 0.00–0.07)
Basophils Absolute: 0 10*3/uL (ref 0.0–0.1)
Basophils Relative: 1 %
Eosinophils Absolute: 0.2 10*3/uL (ref 0.0–0.5)
Eosinophils Relative: 4 %
HCT: 30.1 % — ABNORMAL LOW (ref 39.0–52.0)
Hemoglobin: 9.8 g/dL — ABNORMAL LOW (ref 13.0–17.0)
Immature Granulocytes: 0 %
Lymphocytes Relative: 15 %
Lymphs Abs: 0.9 10*3/uL (ref 0.7–4.0)
MCH: 28.1 pg (ref 26.0–34.0)
MCHC: 32.6 g/dL (ref 30.0–36.0)
MCV: 86.2 fL (ref 80.0–100.0)
Monocytes Absolute: 0.7 10*3/uL (ref 0.1–1.0)
Monocytes Relative: 13 %
Neutro Abs: 3.7 10*3/uL (ref 1.7–7.7)
Neutrophils Relative %: 67 %
Platelets: 50 10*3/uL — ABNORMAL LOW (ref 150–400)
RBC: 3.49 MIL/uL — ABNORMAL LOW (ref 4.22–5.81)
RDW: 16.6 % — ABNORMAL HIGH (ref 11.5–15.5)
WBC: 5.6 10*3/uL (ref 4.0–10.5)
nRBC: 0 % (ref 0.0–0.2)

## 2023-02-02 LAB — COMPREHENSIVE METABOLIC PANEL
ALT: 34 U/L (ref 0–44)
AST: 89 U/L — ABNORMAL HIGH (ref 15–41)
Albumin: 3.1 g/dL — ABNORMAL LOW (ref 3.5–5.0)
Alkaline Phosphatase: 68 U/L (ref 38–126)
Anion gap: 11 (ref 5–15)
BUN: 44 mg/dL — ABNORMAL HIGH (ref 8–23)
CO2: 28 mmol/L (ref 22–32)
Calcium: 9.2 mg/dL (ref 8.9–10.3)
Chloride: 97 mmol/L — ABNORMAL LOW (ref 98–111)
Creatinine, Ser: 2.35 mg/dL — ABNORMAL HIGH (ref 0.61–1.24)
GFR, Estimated: 30 mL/min — ABNORMAL LOW (ref >60)
Glucose, Bld: 138 mg/dL — ABNORMAL HIGH (ref 70–99)
Potassium: 3.5 mmol/L (ref 3.5–5.1)
Sodium: 136 mmol/L (ref 135–145)
Total Bilirubin: 7.7 mg/dL — ABNORMAL HIGH (ref <1.2)
Total Protein: 6.7 g/dL (ref 6.5–8.1)

## 2023-02-02 LAB — GLUCOSE, CAPILLARY
Glucose-Capillary: 120 mg/dL — ABNORMAL HIGH (ref 70–99)
Glucose-Capillary: 126 mg/dL — ABNORMAL HIGH (ref 70–99)
Glucose-Capillary: 119 mg/dL — ABNORMAL HIGH (ref 70–99)
Glucose-Capillary: 125 mg/dL — ABNORMAL HIGH (ref 70–99)

## 2023-02-02 LAB — AMMONIA
Ammonia: 46 umol/L — ABNORMAL HIGH (ref 9–35)
Ammonia: 66 umol/L — ABNORMAL HIGH (ref 9–35)

## 2023-02-02 LAB — MRSA NEXT GEN BY PCR, NASAL: MRSA by PCR Next Gen: NOT DETECTED

## 2023-02-02 LAB — MAGNESIUM: Magnesium: 2.9 mg/dL — ABNORMAL HIGH (ref 1.7–2.4)

## 2023-02-02 LAB — TSH: TSH: 11.895 u[IU]/mL — ABNORMAL HIGH (ref 0.350–4.500)

## 2023-02-02 MED ORDER — LORAZEPAM 2 MG/ML IJ SOLN
2.0000 mg | Freq: Once | INTRAMUSCULAR | Status: AC
  Administered 2023-02-02: 2 mg via INTRAVENOUS
  Filled 2023-02-02: qty 1

## 2023-02-02 MED ORDER — LACTULOSE 10 GM/15ML PO SOLN
20.0000 g | Freq: Two times a day (BID) | ORAL | Status: DC
  Administered 2023-02-03 – 2023-02-04 (×3): 20 g
  Filled 2023-02-02 (×3): qty 30

## 2023-02-02 MED ORDER — INSULIN ASPART 100 UNIT/ML IJ SOLN
0.0000 [IU] | Freq: Three times a day (TID) | INTRAMUSCULAR | Status: DC
  Administered 2023-02-02 – 2023-02-06 (×8): 2 [IU] via SUBCUTANEOUS
  Administered 2023-02-06: 3 [IU] via SUBCUTANEOUS
  Administered 2023-02-07 – 2023-02-08 (×3): 2 [IU] via SUBCUTANEOUS

## 2023-02-02 MED ORDER — INSULIN ASPART 100 UNIT/ML IJ SOLN: 0.0000 [IU] | Freq: Every day | INTRAMUSCULAR | Status: DC

## 2023-02-02 MED ORDER — HALOPERIDOL LACTATE 5 MG/ML IJ SOLN
2.0000 mg | Freq: Three times a day (TID) | INTRAMUSCULAR | Status: DC | PRN
  Administered 2023-02-02: 2 mg via INTRAVENOUS
  Filled 2023-02-02: qty 1

## 2023-02-02 MED ORDER — PANTOPRAZOLE SODIUM 40 MG IV SOLR
40.0000 mg | INTRAVENOUS | Status: DC
  Administered 2023-02-02 – 2023-02-03 (×2): 40 mg via INTRAVENOUS
  Filled 2023-02-02 (×2): qty 10

## 2023-02-02 MED ORDER — ALBUTEROL SULFATE (2.5 MG/3ML) 0.083% IN NEBU: 2.5000 mg | INHALATION_SOLUTION | RESPIRATORY_TRACT | Status: DC | PRN

## 2023-02-02 MED ORDER — HALOPERIDOL LACTATE 5 MG/ML IJ SOLN
2.0000 mg | Freq: Four times a day (QID) | INTRAMUSCULAR | Status: DC | PRN
  Administered 2023-02-02 – 2023-02-06 (×6): 2 mg via INTRAVENOUS
  Filled 2023-02-02 (×8): qty 1

## 2023-02-02 MED ORDER — LEVOTHYROXINE SODIUM 100 MCG/5ML IV SOLN
50.0000 ug | Freq: Every day | INTRAVENOUS | Status: DC
  Administered 2023-02-02 – 2023-02-07 (×6): 50 ug via INTRAVENOUS
  Filled 2023-02-02 (×6): qty 5

## 2023-02-02 MED ORDER — LACTULOSE ENEMA
300.0000 mL | Freq: Once | ORAL | Status: AC
  Administered 2023-02-02: 300 mL via RECTAL
  Filled 2023-02-02: qty 300

## 2023-02-02 MED ORDER — ZIPRASIDONE MESYLATE 20 MG IM SOLR
10.0000 mg | Freq: Once | INTRAMUSCULAR | Status: AC
  Administered 2023-02-02: 10 mg via INTRAMUSCULAR
  Filled 2023-02-02 (×2): qty 20

## 2023-02-02 MED ORDER — LORAZEPAM 2 MG/ML IJ SOLN
1.0000 mg | INTRAMUSCULAR | Status: DC | PRN
  Administered 2023-02-02 – 2023-02-05 (×8): 1 mg via INTRAVENOUS
  Filled 2023-02-02 (×8): qty 1

## 2023-02-02 NOTE — Assessment & Plan Note (Signed)
-  VBG is not indicative of CO2 retention -Continue albuterol PRN -Continue to monitor

## 2023-02-02 NOTE — Progress Notes (Signed)
Patient very agitated, restless, attempting to get out of bed and pulling at lines constantly. Attempts to calm pt have been unsuccessful. MD made aware, see orders.

## 2023-02-02 NOTE — Progress Notes (Signed)
Progress Note   Patient: Daniel Crawford [id number] DOB: 1956-02-10 DOA: 02/01/2023     0 DOS: the patient was seen and examined on 02/02/2023   Brief hospital admission course: As per H&P written by Dr.Zierle-Ghosh on 02/01/23  Daniel Crawford is a 67 y.o. male with medical history significant of cirrhosis, CHF, CKD, depression, diabetes mellitus type 2, hypertension, hypothyroidism, and multiple admissions for hepatic encephalopathy.  Patient is not able to provide any history at the time of my exam.  He was quite agitated in the ED and received several sedating medications.  In the ICU he was still agitated and received a dose of Geodon.  I am told that before these doses he still was not able to provide any history.   Assessment and Plan: * Hepatic encephalopathy (HCC) -Ammonia up to 108; some improvements appreciated -Ammonia around 46 after intervention with lactulose -Will continue treatment -Sitter requested; haldol and lorazepam started to assist controlling agitation and restlessness  -Given patient chronic renal failure and chronic liver disease will try to be judicious about medications to avoid oversedation and further complications.  Essential hypertension -BP soft at admission -Continue to monitor -Holding diuretics -Continue to follow vital signs.  Liver cirrhosis secondary to NASH (HCC) -AST and ALT currently stable -Chronic thrombocytopenia with an upper bleeding appreciated -Ammonia level 108 at time of admission; continue treatment with lactulose and follow response. -Planning to resume the use of rifaximin when able to take by mouth. -Continue PPI -Continue patient follow-up with GI service.  T2DM (type 2 diabetes mellitus) (HCC) -Continue to follow CBGs fluctuation and adjust hypoglycemic regimen as needed -Sliding scale insulin in place.  Acquired hypothyroidism -TSH demonstrating significant elevation and most likely uncontrolled hypothyroidism. -Given inability to  take p.o.'s we will initiate IV Synthroid using 50 mcg daily (patient was using prior to admission 88 mcg; dose has been adjusted in response to her normal TSH).  GERD (gastroesophageal reflux disease) -Continue protonix; currently using IV route as patient unable to tolerate p.o.'s.  Depression with anxiety -Holding Zoloft and Atarax at the moment as patient unable to take p.o.'s. -As needed lorazepam has been initiated.  COPD (chronic obstructive pulmonary disease) (HCC) -VBG is not indicative of CO2 retention -As needed bronchodilator -Good saturation on room air appreciated.  Chronic kidney disease stage IIIb -Appears to be at baseline at the moment -Continue minimizing nephrotoxic agents -Avoid the use of contrast/hypotension -Follow renal function trend   Subjective:  Obtunded, very somnolent and unable to keep himself alert/awake.  Per nursing staff abrupt swing behavior with agitation, restlessness and inability to be redirected.  Physical Exam: Vitals:   02/02/23 1500 02/02/23 1600 02/02/23 1612 02/02/23 1710  BP: (!) 87/35 (!) 116/55  (!) 110/47  Pulse: 78 78  91  Resp: 18 17  20   Temp:   98.6 F (37 C)   TempSrc:   Axillary   SpO2: 100% 100%  100%  Weight:      Height:       General exam: Confused, intermittently agitated and not following commands.  Per nursing staff reported swinging, kicking, and unable to be redirected at times.  On my exam patient was completely obtunded unable to answer questions or keep himself alert/awake. Respiratory system: No wheezing or crackles; good saturation on room air. Cardiovascular system: Rate controlled, no rubs, no gallops, unable to see JVD with body habitus. Gastrointestinal system: Abdomen is obese, nondistended, soft and nontender.  Positive bowel sounds appreciated. Central nervous system: Unable to  assess with current mentation status. Extremities: No cyanosis or clubbing; 1-2+ edema appreciated (appears to be chronic  and associated with baseline lymphedema). Skin: No petechiae.  Multiple bruises and abrasions appreciated on upper extremities bilaterally. Psychiatry: Judgement and insight appear impaired secondary to acute encephalopathic process.  Data Reviewed: Comprehensive metabolic panel: Sodium 136, potassium 3.5, chloride 97, bicarb 28, BUN 44, creatinine 2.35, normal AST/ALT and GFR 30. Magnesium: 2.9 CBC: White blood cell 4.6, hemoglobin 9.8 and platelet count 50K Ammonia: 108>> 46 TSH: 11.895  Family Communication: No family at bedside.  Disposition: Status is: Inpatient Remains inpatient appropriate because: Continue treatment for acute hepatic encephalopathic process.  Patient remains confused, disoriented and intermittently agitated.   Planned Discharge Destination: To be determined.   Time spent: 50 minutes  Author: Vassie Loll, MD 02/02/2023 6:23 PM  For on call review [email or web address].

## 2023-02-02 NOTE — Assessment & Plan Note (Signed)
-  sliding scale coverage

## 2023-02-02 NOTE — Assessment & Plan Note (Signed)
Continue protonix  

## 2023-02-02 NOTE — Assessment & Plan Note (Signed)
Continue synthroid.

## 2023-02-02 NOTE — Progress Notes (Signed)
Pt.agitated,restless confused. Unable to follow any directions. Pt.continues to scream out,trying to get of bed, kicking legs off bed. Resisting any interventions without other staff in room to assist with pt.kicking and swinging arms. Pt.unable to take anything PO.    Dr.Madera sent chat message.

## 2023-02-02 NOTE — Assessment & Plan Note (Signed)
-  Ammonia up to 108 -Known cirrhosis -Lactulose and rectal tube -Trend in the AM

## 2023-02-02 NOTE — Assessment & Plan Note (Signed)
-  Continue zoloft, atarax -continue to monitor

## 2023-02-02 NOTE — Assessment & Plan Note (Signed)
-  BP soft at admission -Continue to monitor

## 2023-02-02 NOTE — Progress Notes (Signed)
   02/02/23 1631  TOC Brief Assessment  Insurance and Status Reviewed  Patient has primary care physician Yes  Home environment has been reviewed From home, apartment has 2 HCPOA listed  Prior level of function: Independent with support  Prior/Current Home Services No current home services  Social Determinants of Health Reivew SDOH reviewed no interventions necessary  Readmission risk has been reviewed Yes  Transition of care needs transition of care needs identified, TOC will continue to follow (Pt presenting agitated, fall prior to admission Saine need PT eval.)

## 2023-02-02 NOTE — Assessment & Plan Note (Signed)
-  AST minimally elevated -ALT 32 -thrombocytopenia -Lactic acidosis - will be slow to clear -Lactic acid is unrevealing -Continue to monitor

## 2023-02-02 NOTE — Progress Notes (Signed)
Dr.Madera notified of the following:   RN notified Charge RN that she was stepping out of unit to get something and upon returning to unit pt.had been found on the floor by Consulting civil engineer.   AC Tim, Sports administrator along with other staff assisted pt.back to bed with ceiling lift.  Pt.talking '' stop it'' when staff attempting to position pt.in bed. When pt.asked if he could hear pt.answers ''yes''  RN expressed concerns with pt's continued attempt to get out bed and with possible future falls. MD aware that pt's agitation is limiting interventions and assessments.

## 2023-02-03 DIAGNOSIS — R7989 Other specified abnormal findings of blood chemistry: Secondary | ICD-10-CM | POA: Diagnosis not present

## 2023-02-03 DIAGNOSIS — K7682 Hepatic encephalopathy: Secondary | ICD-10-CM | POA: Diagnosis not present

## 2023-02-03 DIAGNOSIS — K219 Gastro-esophageal reflux disease without esophagitis: Secondary | ICD-10-CM | POA: Diagnosis not present

## 2023-02-03 DIAGNOSIS — K7581 Nonalcoholic steatohepatitis (NASH): Secondary | ICD-10-CM | POA: Diagnosis not present

## 2023-02-03 LAB — GLUCOSE, CAPILLARY
Glucose-Capillary: 126 mg/dL — ABNORMAL HIGH (ref 70–99)
Glucose-Capillary: 126 mg/dL — ABNORMAL HIGH (ref 70–99)
Glucose-Capillary: 141 mg/dL — ABNORMAL HIGH (ref 70–99)
Glucose-Capillary: 112 mg/dL — ABNORMAL HIGH (ref 70–99)

## 2023-02-03 LAB — CBC
HCT: 30.6 % — ABNORMAL LOW (ref 39.0–52.0)
Hemoglobin: 9.5 g/dL — ABNORMAL LOW (ref 13.0–17.0)
MCH: 27.1 pg (ref 26.0–34.0)
MCHC: 31 g/dL (ref 30.0–36.0)
MCV: 87.2 fL (ref 80.0–100.0)
Platelets: 53 10*3/uL — ABNORMAL LOW (ref 150–400)
RBC: 3.51 MIL/uL — ABNORMAL LOW (ref 4.22–5.81)
RDW: 16.9 % — ABNORMAL HIGH (ref 11.5–15.5)
WBC: 5.4 10*3/uL (ref 4.0–10.5)
nRBC: 0 % (ref 0.0–0.2)

## 2023-02-03 LAB — BASIC METABOLIC PANEL
Anion gap: 10 (ref 5–15)
BUN: 35 mg/dL — ABNORMAL HIGH (ref 8–23)
CO2: 27 mmol/L (ref 22–32)
Calcium: 9.3 mg/dL (ref 8.9–10.3)
Chloride: 102 mmol/L (ref 98–111)
Creatinine, Ser: 1.95 mg/dL — ABNORMAL HIGH (ref 0.61–1.24)
GFR, Estimated: 38 mL/min — ABNORMAL LOW (ref >60)
Glucose, Bld: 129 mg/dL — ABNORMAL HIGH (ref 70–99)
Potassium: 3.5 mmol/L (ref 3.5–5.1)
Sodium: 139 mmol/L (ref 135–145)

## 2023-02-03 LAB — LACTIC ACID, PLASMA: Lactic Acid, Venous: 2 mmol/L (ref 0.5–1.9)

## 2023-02-03 LAB — HEMOGLOBIN A1C
Hgb A1c MFr Bld: 5.6 % (ref 4.8–5.6)
Mean Plasma Glucose: 114.02 mg/dL

## 2023-02-03 MED ORDER — NYSTATIN 100000 UNIT/GM EX POWD
Freq: Three times a day (TID) | CUTANEOUS | Status: DC
  Filled 2023-02-03 (×2): qty 15

## 2023-02-03 NOTE — Progress Notes (Signed)
Progress Note   Patient: Daniel Crawford [id number] DOB: 20-Jan-1956 DOA: 02/01/2023     1 DOS: the patient was seen and examined on 02/03/2023   Brief hospital admission course: As per H&P written by Dr.Zierle-Ghosh on 02/01/23  Daniel Crawford is a 67 y.o. male with medical history significant of cirrhosis, CHF, CKD, depression, diabetes mellitus type 2, hypertension, hypothyroidism, and multiple admissions for hepatic encephalopathy.  Patient is not able to provide any history at the time of my exam.  He was quite agitated in the ED and received several sedating medications.  In the ICU he was still agitated and received a dose of Geodon.  I am told that before these doses he still was not able to provide any history.   Assessment and Plan: * Hepatic encephalopathy (HCC) -Ammonia up to 108; some improvements appreciated -Ammonia around 66 currently   -Continue lactulose -Sitter requested; haldol and lorazepam started to assist controlling agitation and restlessness  -As needed soft restraints for safety will be provided. -Given patient chronic renal failure and chronic liver disease will try to be judicious about medications to avoid oversedation and further complications.  Essential hypertension -BP soft at admission -Continue to monitor -Holding diuretics -Continue to follow vital signs.  Liver cirrhosis secondary to NASH (HCC) -AST and ALT currently stable -Chronic thrombocytopenia with an upper bleeding appreciated -Ammonia level 108 at time of admission; continue treatment with lactulose and follow response. -Planning to resume the use of rifaximin when able to take by mouth. -Continue PPI -Continue patient follow-up with GI service. -66 continue lactulose rectally. -Resume the use of Xifaxan when able to tolerate by mouth.  T2DM (type 2 diabetes mellitus) (HCC) -Continue to follow CBGs fluctuation and adjust hypoglycemic regimen as needed -Sliding scale insulin in place.  Acquired  hypothyroidism -TSH demonstrating significant elevation and most likely uncontrolled hypothyroidism. -Given inability to take p.o.'s we will initiate IV Synthroid using 50 mcg daily (patient was using prior to admission 88 mcg; dose has been adjusted in response to her normal TSH).  GERD (gastroesophageal reflux disease) -Continue protonix; currently using IV route as patient unable to tolerate p.o.'s.  Depression with anxiety -Holding Zoloft and Atarax at the moment as patient unable to take p.o.'s. -Continue as needed lorazepam as initiated.  COPD (chronic obstructive pulmonary disease) (HCC) -VBG is not indicative of CO2 retention -Continue as needed bronchodilator -Follow oxygen saturation and provide supplementation as needed.  Chronic kidney disease stage IIIb -Appears to be at baseline to improved from baseline. -Continue minimizing nephrotoxic agents -Avoid the use of contrast/hypotension -Continue to follow renal function trend   Subjective:  Patient with mechanical fall on 02/02/2023 no injury/trauma appreciated from that incident.  No chest pain, no nausea, no vomiting no using accessory muscle.  Physical Exam: Vitals:   02/03/23 1300 02/03/23 1400 02/03/23 1500 02/03/23 1600  BP: (!) 158/48 (!) 151/85 131/60 (!) 145/71  Pulse: (!) 110 (!) 108 90 (!) 109  Resp: 20 14 (!) 21 (!) 34  Temp:      TempSrc:      SpO2: 100% 100% 97% 98%  Weight:      Height:       General exam: Still confused and somnolent; but overall more interactive and intermittently able to answer simple questions.  No fever, no chest pain, no nausea, no vomiting. Respiratory system: No wheezing, no crackles, no using accessory muscle.  2 L nasal cannula supplementation in place with good oxygen saturation appreciated. Cardiovascular system: Rate controlled,  no rubs, no gallops, unable to assess JVD with body habitus. Gastrointestinal system: Abdomen is obese, nondistended, soft and nontender.   Positive bowel sounds.  No ascites. Central nervous system: Limited examination secondary to encephalopathy; moving 4 limbs spontaneously.   Extremities: No cyanosis or clubbing; 1-2+ edema appreciated bilaterally (chronic lymphedema has been reported). Skin: No petechiae. Psychiatry: Limited examination with current encephalopathic process.  Intermittent agitation, confusion and restlessness has been reported.  Data Reviewed: Basic metabolic panel: Sodium 139, potassium 3.5, chloride 102, bicarb 27, BUN 35, creatinine 1.95 and GFR 38. Magnesium: 2.9 CBC: White blood cell 4.6, hemoglobin 9.8 and platelet count 50K Ammonia: 108>> 46>>66 TSH: 11.895  Family Communication: No family at bedside.  Disposition: Status is: Inpatient Remains inpatient appropriate because: Continue treatment for acute hepatic encephalopathic process.  Patient remains confused, disoriented and intermittently agitated.   Planned Discharge Destination: To be determined.  Time spent: 50 minutes  Author: Vassie Loll, MD 02/03/2023 4:29 PM  For on call review [email or web address].

## 2023-02-03 NOTE — Progress Notes (Signed)
Lactulose per rectal tube ordered by Dr. Carren Rang to replace previous "per tube" lactulose order since pt cannot safely take POs.

## 2023-02-04 DIAGNOSIS — K7581 Nonalcoholic steatohepatitis (NASH): Secondary | ICD-10-CM | POA: Diagnosis not present

## 2023-02-04 DIAGNOSIS — K7682 Hepatic encephalopathy: Secondary | ICD-10-CM | POA: Diagnosis not present

## 2023-02-04 DIAGNOSIS — K219 Gastro-esophageal reflux disease without esophagitis: Secondary | ICD-10-CM | POA: Diagnosis not present

## 2023-02-04 DIAGNOSIS — R7989 Other specified abnormal findings of blood chemistry: Secondary | ICD-10-CM | POA: Diagnosis not present

## 2023-02-04 LAB — BASIC METABOLIC PANEL
Anion gap: 13 (ref 5–15)
BUN: 33 mg/dL — ABNORMAL HIGH (ref 8–23)
CO2: 26 mmol/L (ref 22–32)
Calcium: 9.2 mg/dL (ref 8.9–10.3)
Chloride: 105 mmol/L (ref 98–111)
Creatinine, Ser: 1.99 mg/dL — ABNORMAL HIGH (ref 0.61–1.24)
GFR, Estimated: 37 mL/min — ABNORMAL LOW (ref >60)
Glucose, Bld: 141 mg/dL — ABNORMAL HIGH (ref 70–99)
Potassium: 3.4 mmol/L — ABNORMAL LOW (ref 3.5–5.1)
Sodium: 144 mmol/L (ref 135–145)

## 2023-02-04 LAB — AMMONIA: Ammonia: 78 umol/L — ABNORMAL HIGH (ref 9–35)

## 2023-02-04 LAB — GLUCOSE, CAPILLARY
Glucose-Capillary: 132 mg/dL — ABNORMAL HIGH (ref 70–99)
Glucose-Capillary: 145 mg/dL — ABNORMAL HIGH (ref 70–99)
Glucose-Capillary: 138 mg/dL — ABNORMAL HIGH (ref 70–99)
Glucose-Capillary: 132 mg/dL — ABNORMAL HIGH (ref 70–99)

## 2023-02-04 MED ORDER — RIFAXIMIN 550 MG PO TABS
550.0000 mg | ORAL_TABLET | Freq: Two times a day (BID) | ORAL | Status: DC
  Administered 2023-02-04 – 2023-02-08 (×8): 550 mg via ORAL
  Filled 2023-02-04 (×8): qty 1

## 2023-02-04 MED ORDER — PANTOPRAZOLE SODIUM 40 MG PO TBEC
40.0000 mg | DELAYED_RELEASE_TABLET | Freq: Every day | ORAL | Status: DC
  Administered 2023-02-04 – 2023-02-08 (×5): 40 mg via ORAL
  Filled 2023-02-04 (×5): qty 1

## 2023-02-04 MED ORDER — LACTULOSE 10 GM/15ML PO SOLN
30.0000 g | Freq: Three times a day (TID) | ORAL | Status: DC
  Administered 2023-02-04 – 2023-02-08 (×13): 30 g via ORAL
  Filled 2023-02-04 (×13): qty 60

## 2023-02-04 NOTE — Plan of Care (Signed)
  Problem: Education: Goal: Knowledge of General Education information will improve Description: Including pain rating scale, medication(s)/side effects and non-pharmacologic comfort measures Outcome: Progressing   Problem: Health Behavior/Discharge Planning: Goal: Ability to manage health-related needs will improve Outcome: Progressing   Problem: Clinical Measurements: Goal: Ability to maintain clinical measurements within normal limits will improve Outcome: Progressing Goal: Will remain free from infection Outcome: Progressing Goal: Diagnostic test results will improve Outcome: Progressing Goal: Respiratory complications will improve Outcome: Progressing Goal: Cardiovascular complication will be avoided Outcome: Progressing   Problem: Activity: Goal: Risk for activity intolerance will decrease Outcome: Progressing   Problem: Nutrition: Goal: Adequate nutrition will be maintained Outcome: Progressing   Problem: Coping: Goal: Level of anxiety will decrease Outcome: Progressing   Problem: Elimination: Goal: Will not experience complications related to bowel motility Outcome: Progressing Goal: Will not experience complications related to urinary retention Outcome: Progressing   Problem: Pain Management: Goal: General experience of comfort will improve Outcome: Progressing   Problem: Safety: Goal: Ability to remain free from injury will improve Outcome: Progressing   Problem: Skin Integrity: Goal: Risk for impaired skin integrity will decrease Outcome: Progressing   Problem: Education: Goal: Ability to describe self-care measures that may prevent or decrease complications (Diabetes Survival Skills Education) will improve Outcome: Progressing Goal: Individualized Educational Video(s) Outcome: Progressing   Problem: Coping: Goal: Ability to adjust to condition or change in health will improve Outcome: Progressing   Problem: Fluid Volume: Goal: Ability to  maintain a balanced intake and output will improve Outcome: Progressing   Problem: Health Behavior/Discharge Planning: Goal: Ability to identify and utilize available resources and services will improve Outcome: Progressing Goal: Ability to manage health-related needs will improve Outcome: Progressing   Problem: Metabolic: Goal: Ability to maintain appropriate glucose levels will improve Outcome: Progressing   Problem: Nutritional: Goal: Maintenance of adequate nutrition will improve Outcome: Progressing Goal: Progress toward achieving an optimal weight will improve Outcome: Progressing   Problem: Skin Integrity: Goal: Risk for impaired skin integrity will decrease Outcome: Progressing   Problem: Tissue Perfusion: Goal: Adequacy of tissue perfusion will improve Outcome: Progressing   Problem: Safety: Goal: Non-violent Restraint(s) Outcome: Progressing

## 2023-02-04 NOTE — TOC Initial Note (Signed)
Transition of Care [facility]) - Initial/Assessment Note    Patient Details  Name: Daniel Crawford MRN: [id number] Date of Birth: 07-Strassner-1957  Transition of Care [facility]) CM/SW Contact:    Karn Cassis, LCSW Phone Number: 02/04/2023, 9:42 AM  Clinical Narrative: Pt has high risk readmission score. Assessment completed with pt's friend/HCPOA, Carney Bern as pt oriented to self only per chart. Per Carney Bern, pt lives alone and is fairly independent with ADLs. He still drives. Pt has walker at home if needed. He is active with [facility]. Sarah with [facility] aware of admission. Carney Bern unsure of d/c needs at this time, but said pt would want to return home if able. His family lives in [place], and Carney Bern is best local support. TOC will follow.                  Expected Discharge Plan:  (to be determined) Barriers to Discharge: Continued Medical Work up   Patient Goals and CMS Choice Patient states their goals for this hospitalization and ongoing recovery are:: return home          Expected Discharge Plan and Services In-house Referral: Clinical Social Work   Post Acute Care Choice: Resumption of Svcs/PTA Provider Living arrangements for the past 2 months: Apartment                           HH Arranged: PT HH Agency: Other - See comment Producer, television/film/video) Date HH Agency Contacted: 02/04/23 Time HH Agency Contacted: [phone] Representative spoke with at [facility] Agency: Sarah  Prior Living Arrangements/Services Living arrangements for the past 2 months: Apartment Lives with:: Self Patient language and need for interpreter reviewed:: Yes            Current home services: DME (walker) Criminal Activity/Legal Involvement Pertinent to Current Situation/Hospitalization: No - Comment as needed  Activities of Daily Living      Permission Sought/Granted                  Emotional Assessment   Attitude/Demeanor/Rapport: Unable to Assess   Orientation: : Oriented to Self Alcohol / Substance Use:  Not Applicable Psych Involvement: No (comment)  Admission diagnosis:  Hepatic encephalopathy (HCC) [K76.82] Tachycardia [R00.0] Altered mental status, unspecified altered mental status type [R41.82] Patient Active Problem List   Diagnosis Date Noted   Hepatic encephalopathy (HCC) 02/01/2023   Acute kidney injury superimposed on stage 3b chronic kidney disease (HCC) 01/27/2023   COPD (chronic obstructive pulmonary disease) (HCC) 01/21/2023   Acute on chronic diastolic (congestive) heart failure (HCC) 01/21/2023   Cellulitis and abscess of left leg 10/23/2022   Chronic hyponatremia 10/21/2022   T2DM (type 2 diabetes mellitus) (HCC) 07/06/2022   CKD stage 3a, GFR 45-59 ml/min (HCC) 07/06/2022   Umbilical hernia 07/05/2022   Obesity, class 3 05/25/2022   Liver cirrhosis secondary to NASH (HCC) 03/13/2022   Iron deficiency anemia 02/28/2022   GERD (gastroesophageal reflux disease) 02/28/2022   Generalized weakness 01/23/2022   Essential hypertension 01/23/2022   Volume overload 01/23/2022   Protein calorie malnutrition (HCC) 01/22/2022   Chronic diastolic heart failure (HCC)    Hypogonadism, male 12/26/2021   History of colonic polyps 07/27/2021   Auditory hallucinations 07/27/2021   Cellulitis of left leg 03/26/2021   OSA (obstructive sleep apnea) 01/20/2020   History of ascites 10/22/2019   Acquired hypothyroidism 10/16/2016   Depression with anxiety 10/16/2016   Thrombocytopenia (HCC) 10/16/2016   Spleen enlarged 10/16/2016  Class 3 severe obesity in adult [facility]) 02/28/2016   PCP:  [name], MD Pharmacy:   [facility] [phone] - [place], [place] - [address] [facility] [address] [place] [address] Phone: [phone] Fax: [phone]  [facility] [address] [place] [address] Phone: [phone] Fax: [phone]     Social Determinants of Health (SDOH) Social History: SDOH Screenings    Food Insecurity: No Food Insecurity (01/21/2023)  Housing: Low Risk  (01/21/2023)  Transportation Needs: No Transportation Needs (01/21/2023)  Utilities: Not At Risk (01/21/2023)  Depression (PHQ2-9): Medium Risk (10/27/2019)  Financial Resource Strain: Low Risk  (03/28/2021)   Received from [facility]  Tobacco Use: Medium Risk (02/01/2023)   SDOH Interventions:     Readmission Risk Interventions    02/04/2023    9:32 AM 12/21/2022    9:17 AM 10/20/2022    2:15 PM  Readmission Risk Prevention Plan  Transportation Screening Complete Complete Complete  Medication Review Oceanographer) Complete Complete Complete  PCP or Specialist appointment within 3-5 days of discharge   Not Complete  HRI or Home Care Consult Complete Complete Complete  SW Recovery Care/Counseling Consult Complete Complete Complete  Palliative Care Screening Not Applicable Not Applicable Not Applicable  Skilled Nursing Facility Not Applicable Not Applicable Not Applicable

## 2023-02-04 NOTE — Progress Notes (Signed)
Progress Note   Patient: Daniel Crawford ION:[id number] DOB: Apr 01, 1955 DOA: 02/01/2023     2 DOS: the patient was seen and examined on 02/04/2023   Brief hospital admission course: As per H&P written by Dr.Zierle-Ghosh on 02/01/23  Daniel Crawford is a 67 y.o. male with medical history significant of cirrhosis, CHF, CKD, depression, diabetes mellitus type 2, hypertension, hypothyroidism, and multiple admissions for hepatic encephalopathy.  Patient is not able to provide any history at the time of my exam.  He was quite agitated in the ED and received several sedating medications.  In the ICU he was still agitated and received a dose of Geodon.  I am told that before these doses he still was not able to provide any history.   Assessment and Plan: * Hepatic encephalopathy (HCC) -Ammonia 108 at time of admission -Ammonia around 66>>78   -Continue lactulose; now the patient mentation is improving will transition to oral route and use 30 g 3 times a day. -Will continue close monitoring of his behavioral status/mood minimizing as much as possible extra sedative agents while using sitter and if needed soft restraints. -Continue as needed Haldol and lorazepam ordered to control agitation and restlessness if required. -Given patient chronic renal failure and chronic liver disease will try to be judicious about medications to avoid oversedation and further complications.  Essential hypertension -BP soft at admission; but currently stable. -Continue to monitor for signs. -Continue holding diuretics for now.  Liver cirrhosis secondary to NASH (HCC) -AST and ALT currently stable -Chronic thrombocytopenia with an upper bleeding appreciated -Ammonia level 108 at time of admission; continue treatment with lactulose and follow response. -Planning to resume the use of rifaximin when able to take by mouth. -Continue PPI -Continue patient follow-up with GI service. - continue lactulose and follow ammonia level. -Resume  the use of Xifaxan  T2DM (type 2 diabetes mellitus) (HCC) -Continue to follow CBGs fluctuation and adjust hypoglycemic regimen as needed -Sliding scale insulin in place.  Acquired hypothyroidism -TSH demonstrating significant elevation and most likely uncontrolled hypothyroidism. -Continue the use of IV Synthroid for now 50 mcg daily (patient was using prior to admission 88 mcg; dose has been adjusted in response to his abnormal TSH level).  GERD (gastroesophageal reflux disease) -Continue protonix  Depression with anxiety -Continue as needed lorazepam as initiated. -Resume treatment with Zoloft.  COPD (chronic obstructive pulmonary disease) (HCC) -VBG is not indicative of CO2 retention -Continue as needed bronchodilator -Follow oxygen saturation and provide supplementation as needed.  Chronic kidney disease stage IIIb -Appears to be at baseline to improved from baseline. -Continue minimizing nephrotoxic agents -Avoid the use of contrast/hypotension -Continue to follow renal function trend -Creatinine 1.95 currently.   Subjective:  Afebrile, no chest pain, no abdominal pain.  No nausea or vomiting reported.  Patient has started to demonstrate improvement in his mentation, opening eyes to verbal commands and able to follow simple orders.  Physical Exam: Vitals:   02/04/23 0803 02/04/23 0830 02/04/23 0900 02/04/23 1000  BP:   (!) 106/42 (!) 137/59  Pulse:  97 (!) 103 (!) 104  Resp:  17 (!) 24 18  Temp: 97.8 F (36.6 C)     TempSrc: Axillary     SpO2:  98% 100% 99%  Weight:      Height:       General exam: Still somnolent and restless at times; overall improving and demonstrating less encephalopathic process. Respiratory system: Scattered rhonchi; no wheezing, no frank crackles.  Good saturation on room  air appreciated on today's exam. Cardiovascular system:RRR. No rubs or gallops. Gastrointestinal system: Abdomen is obese, nondistended, soft and nontender. No  organomegaly or masses felt. Normal bowel sounds heard. Central nervous system: Moving 4 limbs spontaneously.  No focal neurological deficits. Extremities: No cyanosis or clubbing. Skin: No petechiae.  Multiple bruises/abrasion in his upper extremities appreciated. Psychiatry: Limited examination given current mentation.  Latest data Reviewed: Basic metabolic panel: Sodium 139, potassium 3.5, chloride 102, bicarb 27, BUN 35, creatinine 1.95 and GFR 38. Magnesium: 2.9 CBC: White blood cell 4.6, hemoglobin 9.8 and platelet count 50K Ammonia: 108>> 46>>66 TSH: 11.895  Family Communication: No family at bedside.  Disposition: Status is: Inpatient Remains inpatient appropriate because: Continue treatment for acute hepatic encephalopathic process.  Patient remains confused, disoriented and intermittently agitated.   Planned Discharge Destination: To be determined.  Time spent: 50 minutes  Author: Vassie Loll, MD 02/04/2023 11:10 AM  For on call review [email or web address].

## 2023-02-05 DIAGNOSIS — R7989 Other specified abnormal findings of blood chemistry: Secondary | ICD-10-CM | POA: Diagnosis not present

## 2023-02-05 DIAGNOSIS — K219 Gastro-esophageal reflux disease without esophagitis: Secondary | ICD-10-CM | POA: Diagnosis not present

## 2023-02-05 DIAGNOSIS — K7682 Hepatic encephalopathy: Secondary | ICD-10-CM | POA: Diagnosis not present

## 2023-02-05 DIAGNOSIS — K7581 Nonalcoholic steatohepatitis (NASH): Secondary | ICD-10-CM | POA: Diagnosis not present

## 2023-02-05 LAB — GLUCOSE, CAPILLARY
Glucose-Capillary: 138 mg/dL — ABNORMAL HIGH (ref 70–99)
Glucose-Capillary: 116 mg/dL — ABNORMAL HIGH (ref 70–99)
Glucose-Capillary: 108 mg/dL — ABNORMAL HIGH (ref 70–99)
Glucose-Capillary: 157 mg/dL — ABNORMAL HIGH (ref 70–99)

## 2023-02-05 LAB — AMMONIA: Ammonia: 55 umol/L — ABNORMAL HIGH (ref 9–35)

## 2023-02-05 MED ORDER — SPIRONOLACTONE 25 MG PO TABS
50.0000 mg | ORAL_TABLET | Freq: Every day | ORAL | Status: DC
  Administered 2023-02-05 – 2023-02-06 (×2): 50 mg via ORAL
  Filled 2023-02-05 (×2): qty 2

## 2023-02-05 MED ORDER — TORSEMIDE 20 MG PO TABS
40.0000 mg | ORAL_TABLET | Freq: Two times a day (BID) | ORAL | Status: DC
  Administered 2023-02-05 – 2023-02-06 (×2): 40 mg via ORAL
  Filled 2023-02-05 (×2): qty 2

## 2023-02-05 NOTE — Progress Notes (Signed)
Progress Note   Patient: Noeh Duffek ZOX:[id number] DOB: 1955-07-29 DOA: 02/01/2023     3 DOS: the patient was seen and examined on 02/05/2023   Brief hospital admission course: As per H&P written by Dr.Zierle-Ghosh on 02/01/23  Lion Jiggetts is a 67 y.o. male with medical history significant of cirrhosis, CHF, CKD, depression, diabetes mellitus type 2, hypertension, hypothyroidism, and multiple admissions for hepatic encephalopathy.  Patient is not able to provide any history at the time of my exam.  He was quite agitated in the ED and received several sedating medications.  In the ICU he was still agitated and received a dose of Geodon.  I am told that before these doses he still was not able to provide any history.   Assessment and Plan: * Hepatic encephalopathy (HCC) -Ammonia 108 at time of admission -Ammonia around 66>>78>>55 -Continue lactulose by mouth and restart his Xifaxan. -Will continue close monitoring of his behavioral status/mood  -If needed as needed sitter will be appropriate. -Continue as needed Haldol and lorazepam ordered to control agitation and restlessness if required (while being very judicious about sedative agents). -Given patient chronic renal failure and chronic liver disease will try to be judicious about medications to avoid oversedation and further complications.  Essential hypertension -BP soft at admission; but currently stable. -Continue to monitor vital signs. -Will resume half dose of his spironolactone and Lasix.  Liver cirrhosis secondary to NASH (HCC) -AST and ALT currently stable -Chronic thrombocytopenia with an upper bleeding appreciated -Ammonia level 108 at time of admission; continue treatment with lactulose and follow response. -Planning to resume the use of rifaximin when able to take by mouth. -Continue PPI -Continue patient follow-up with GI service. - continue to follow ammonia level. -As mentioned above continue rifaximin and the use of  lactulose. -Will resume the use of his spironolactone and Lasix (starting at half dose).  T2DM (type 2 diabetes mellitus) (HCC) -Continue to follow CBGs fluctuation and adjust hypoglycemic regimen as needed -Continue sliding scale insulin in place.  Acquired hypothyroidism -TSH demonstrating significant elevation and most likely uncontrolled hypothyroidism. -Continue the use of IV Synthroid for now 50 mcg daily (patient was using prior to admission 88 mcg; dose has been adjusted in response to his abnormal TSH level). -At discharge patient will need adjusted dose of oral Synthroid and close monitoring of his thyroid panel.  GERD (gastroesophageal reflux disease) -Continue protonix  Depression with anxiety -Continue as needed lorazepam as initiated. -Resume treatment with Zoloft.  COPD (chronic obstructive pulmonary disease) (HCC) -VBG is not indicative of CO2 retention -Continue as needed bronchodilator -Follow oxygen saturation and provide supplementation as needed.  Chronic kidney disease stage IIIb -Appears to be at baseline to improved from baseline. -Continue minimizing nephrotoxic agents -Avoid the use of contrast/hypotension -Continue to follow renal function trend  Chronic diastolic heart failure -Stable and compensated -Will resume half dose of diuretic regimen and continue to follow daily weights/strict I's and O's -Low-sodium diet has been discussed with patient.  Physical deconditioning -PT evaluation and management requested.   Subjective:  No fever, no chest pain, no abdominal pain.  Patient reports no nausea or vomiting.  Oriented to place, person and for the most part situation.  Still with mild disorientation as per nursing staff but no significant behavioral disturbances appreciated.  Physical Exam: Vitals:   02/05/23 1100 02/05/23 1200 02/05/23 1300 02/05/23 1400  BP: 135/71 (!) 119/52 (!) 146/68 119/79  Pulse:   85 93  Resp: (!) 24  Temp:       TempSrc:      SpO2:   100% 100%  Weight:      Height:       General exam: Alert, awake, oriented x 3 intermittently; no acute complaints. Respiratory system: Clear to auscultation. Respiratory effort normal.  Good saturation on room air. Cardiovascular system:RRR. No rubs or gallops; unable to assess JVD with body habitus. Gastrointestinal system: Abdomen is obese, nondistended, soft and nontender. No organomegaly or masses felt. Normal bowel sounds heard. Central nervous system: 4 limbs spontaneously.  No focal neurological deficits. Extremities: No cyanosis or clubbing; trace to 1+ edema appreciated bilaterally. Skin: No petechiae; multiple bruises and abrasion appreciated in his limbs. Psychiatry: Judgement and insight improving and demonstrating adequate mood on today's exam.  Latest data Reviewed: Basic metabolic panel: Sodium 139, potassium 3.5, chloride 102, bicarb 27, BUN 35, creatinine 1.95 and GFR 38. Magnesium: 2.9 CBC: White blood cell 4.6, hemoglobin 9.8 and platelet count 50K Ammonia: 108>> 46>>66>>78>>55 TSH: 11.895  Family Communication: No family at bedside.  Disposition: Status is: Inpatient Remains inpatient appropriate because: Continue treatment for acute hepatic encephalopathic process.  Patient remains confused, disoriented and intermittently agitated.   Planned Discharge Destination: To be determined.  Time spent: 50 minutes  Author: Vassie Loll, MD 02/05/2023 2:41 PM  For on call review [email or web address].

## 2023-02-05 NOTE — NC FL2 (Addendum)
[place] MEDICAID FL2 LEVEL OF CARE FORM     IDENTIFICATION  Patient Name: Daniel Crawford Birthdate: Dec 29, 1955 Sex: male Admission Date (Current Location): 02/01/2023  [facility] and [place] Number:  [employer] and Address:  [facility],  [address] [place] [address]      Provider Number: [id number]  Attending Physician Name and Address:  Vassie Loll, MD  Relative Name and Phone Number:  Earnest Conroy Clearence Cheek)  [phone]    Current Level of Care: Hospital Recommended Level of Care: Skilled Nursing Facility Prior Approval Number:    Date Approved/Denied:   PASRR Number:    [id number]    Discharge Plan: SNF    Current Diagnoses: Patient Active Problem List   Diagnosis Date Noted   Hepatic encephalopathy (HCC) 02/01/2023   Acute kidney injury superimposed on stage 3b chronic kidney disease (HCC) 01/27/2023   COPD (chronic obstructive pulmonary disease) (HCC) 01/21/2023   Acute on chronic diastolic (congestive) heart failure (HCC) 01/21/2023   Cellulitis and abscess of left leg 10/23/2022   Chronic hyponatremia 10/21/2022   T2DM (type 2 diabetes mellitus) (HCC) 07/06/2022   CKD stage 3a, GFR 45-59 ml/min (HCC) 07/06/2022   Umbilical hernia 07/05/2022   Obesity, class 3 05/25/2022   Liver cirrhosis secondary to NASH (HCC) 03/13/2022   Iron deficiency anemia 02/28/2022   GERD (gastroesophageal reflux disease) 02/28/2022   Generalized weakness 01/23/2022   Essential hypertension 01/23/2022   Volume overload 01/23/2022   Protein calorie malnutrition (HCC) 01/22/2022   Chronic diastolic heart failure (HCC)    Hypogonadism, male 12/26/2021   History of colonic polyps 07/27/2021   Auditory hallucinations 07/27/2021   Cellulitis of left leg 03/26/2021   OSA (obstructive sleep apnea) 01/20/2020   History of ascites 10/22/2019   Acquired hypothyroidism 10/16/2016   Depression with anxiety 10/16/2016   Thrombocytopenia (HCC)  10/16/2016   Spleen enlarged 10/16/2016   Class 3 severe obesity in adult [facility]) 02/28/2016    Orientation RESPIRATION BLADDER Height & Weight     Self, Time, Situation, Place  Normal Incontinent Weight: (!) 147.3 kg Height:  6\' 2"  (188 cm)  BEHAVIORAL SYMPTOMS/MOOD NEUROLOGICAL BOWEL NUTRITION STATUS      Incontinent Diet (See DC Summary)  AMBULATORY STATUS COMMUNICATION OF NEEDS Skin     Verbally Bruising                       Personal Care Assistance Level of Assistance  Bathing, Feeding, Dressing Bathing Assistance: Maximum assistance Feeding assistance: Limited assistance Dressing Assistance: Maximum assistance Total Care Assistance: Maximum assistance   Functional Limitations Info  Hearing, Sight, Speech Sight Info: Impaired Hearing Info: Adequate Speech Info: Adequate    SPECIAL CARE FACTORS FREQUENCY  PT (By licensed PT), OT (By licensed OT)     PT Frequency: 5 x a week OT Frequency: 5 X a week            Contractures Contractures Info: Not present    Additional Factors Info  Code Status, Allergies Code Status Info: DNR - Limited Allergies Info: NKDA           Current Medications (02/05/2023):  This is the current hospital active medication list Current Facility-Administered Medications  Medication Dose Route Frequency Provider Last Rate Last Admin   acetaminophen (TYLENOL) tablet 650 mg  650 mg Oral Q6H PRN Vassie Loll, MD       Or   acetaminophen (TYLENOL) suppository 650 mg  650 mg Rectal Q6H PRN  Vassie Loll, MD       albuterol (PROVENTIL) (2.5 MG/3ML) 0.083% nebulizer solution 2.5 mg  2.5 mg Nebulization Q4H PRN Vassie Loll, MD       Chlorhexidine Gluconate Cloth 2 % PADS 6 each  6 each Topical Q0600 Vassie Loll, MD   6 each at 02/05/23 0549   haloperidol lactate (HALDOL) injection 2 mg  2 mg Intravenous Q6H PRN Vassie Loll, MD   2 mg at 02/04/23 0007   insulin aspart (novoLOG) injection 0-15 Units  0-15 Units Subcutaneous TID  [facility] Vassie Loll, MD   2 Units at 02/05/23 0933   insulin aspart (novoLOG) injection 0-5 Units  0-5 Units Subcutaneous QHS Vassie Loll, MD       lactulose (CHRONULAC) 10 GM/15ML solution 30 g  30 g Oral TID Vassie Loll, MD   30 g at 02/05/23 1610   levothyroxine (SYNTHROID, LEVOTHROID) injection 50 mcg  50 mcg Intravenous Daily Vassie Loll, MD   50 mcg at 02/05/23 0932   LORazepam (ATIVAN) injection 1 mg  1 mg Intravenous Q4H PRN Vassie Loll, MD   1 mg at 02/04/23 1252   nystatin (MYCOSTATIN/NYSTOP) topical powder   Topical TID Vassie Loll, MD   Given at 02/05/23 0934   ondansetron (ZOFRAN) tablet 4 mg  4 mg Oral Q6H PRN Vassie Loll, MD       Or   ondansetron [facility]) injection 4 mg  4 mg Intravenous Q6H PRN Vassie Loll, MD       Oral care mouth rinse  15 mL Mouth Rinse PRN Vassie Loll, MD       pantoprazole (PROTONIX) EC tablet 40 mg  40 mg Oral Daily Vassie Loll, MD   40 mg at 02/05/23 0933   rifaximin (XIFAXAN) tablet 550 mg  550 mg Oral BID Vassie Loll, MD   550 mg at 02/05/23 0933   sertraline (ZOLOFT) tablet 100 mg  100 mg Oral Daily Vassie Loll, MD   100 mg at 02/05/23 9604   spironolactone (ALDACTONE) tablet 50 mg  50 mg Oral Daily Vassie Loll, MD   50 mg at 02/05/23 1357   torsemide (DEMADEX) tablet 40 mg  40 mg Oral BID Vassie Loll, MD         Discharge Medications: Please see discharge summary for a list of discharge medications.  Relevant Imaging Results:  Relevant Lab Results:   Additional Information SS: [id number]  Leitha Bleak, RN

## 2023-02-05 NOTE — TOC Progression Note (Signed)
Transition of Care [facility]) - Progression Note    Patient Details  Name: Daniel Crawford MRN: [id number] Date of Birth: 10-18-1955  [facility]) CM/SW Contact  Leitha Bleak, RN Phone Number: 02/05/2023, 3:12 PM  Clinical Narrative:   Wille Glaser called back, She is agreeable to SNF. She will continue to discuss with patient. Requested [facility], TOC sending FL2 out for bed offers to be discuss with patient. TOC following.    Expected Discharge Plan: Skilled Nursing Facility Barriers to Discharge: Continued Medical Work up  Expected Discharge Plan and Services In-house Referral: Clinical Social Work   Post Acute Care Choice: Resumption of Svcs/PTA Provider Living arrangements for the past 2 months: Apartment                           HH Arranged: PT HH Agency: Other - See comment Producer, television/film/video) Date HH Agency Contacted: 02/04/23 Time HH Agency Contacted: [phone] Representative spoke with at [facility] Agency: Maralyn Sago   Social Determinants of Health (SDOH) Interventions SDOH Screenings   Food Insecurity: No Food Insecurity (01/21/2023)  Housing: Low Risk  (01/21/2023)  Transportation Needs: No Transportation Needs (01/21/2023)  Utilities: Not At Risk (01/21/2023)  Depression (PHQ2-9): Medium Risk (10/27/2019)  Financial Resource Strain: Low Risk  (03/28/2021)   [facility] from [facility]  Tobacco Use: Medium Risk (02/01/2023)    Readmission Risk Interventions    02/04/2023    9:32 AM 12/21/2022    9:17 AM 10/20/2022    2:15 PM  Readmission Risk Prevention Plan  Transportation Screening Complete Complete Complete  Medication Review Oceanographer) Complete Complete Complete  PCP or Specialist appointment within 3-5 days of discharge   Not Complete  HRI or Home Care Consult Complete Complete Complete  SW Recovery Care/Counseling Consult Complete Complete Complete  Palliative Care Screening Not Applicable Not Applicable Not Applicable  Skilled Nursing Facility Not  Applicable Not Applicable Not Applicable

## 2023-02-05 NOTE — TOC Progression Note (Signed)
Transition of Care [facility]) - Progression Note    Patient Details  Name: Daniel Crawford MRN: [id number] Date of Birth: March 25, 1956  Transition [facility]) CM/SW Contact  Leitha Bleak, RN Phone Number: 02/05/2023, 2:45 PM  Clinical Narrative:   PT is recommending SNF. Patient is moving out of ICU, still not medically ready. TOC left message with POA to discuss PT eval. Patient has denied SNF in the past. TOC following.     Expected Discharge Plan:  (to be determined) Barriers to Discharge: Continued Medical Work up  Expected Discharge Plan and Services In-house Referral: Clinical Social Work   Post Acute Care Choice: Resumption of Svcs/PTA Provider Living arrangements for the past 2 months: Apartment                      HH Arranged: PT HH Agency: Other - See comment Producer, television/film/video) Date HH Agency Contacted: 02/04/23 Time HH Agency Contacted: [phone] Representative spoke with at [facility] Agency: Maralyn Sago   Social Determinants of Health (SDOH) Interventions SDOH Screenings   Food Insecurity: No Food Insecurity (01/21/2023)  Housing: Low Risk  (01/21/2023)  Transportation Needs: No Transportation Needs (01/21/2023)  Utilities: Not At Risk (01/21/2023)  Depression (PHQ2-9): Medium Risk (10/27/2019)  Financial Resource Strain: Low Risk  (03/28/2021)   Received from [facility], [facility]  Tobacco Use: Medium Risk (02/01/2023)    Readmission Risk Interventions    02/04/2023    9:32 AM 12/21/2022    9:17 AM 10/20/2022    2:15 PM  Readmission Risk Prevention Plan  Transportation Screening Complete Complete Complete  Medication Review Oceanographer) Complete Complete Complete  PCP or Specialist appointment within 3-5 days of discharge   Not Complete  HRI or Home Care Consult Complete Complete Complete  SW Recovery Care/Counseling Consult Complete Complete Complete  Palliative Care Screening Not Applicable Not Applicable Not Applicable  Skilled Nursing Facility Not Applicable Not  Applicable Not Applicable

## 2023-02-05 NOTE — Plan of Care (Signed)

## 2023-02-05 NOTE — Plan of Care (Signed)
  Problem: Acute Rehab PT Goals(only PT should resolve) Goal: Pt Will Go Supine/Side To Sit Outcome: Progressing Flowsheets (Taken 02/05/2023 1535) Pt will go Supine/Side to Sit: with supervision Goal: Patient Will Transfer Sit To/From Stand Outcome: Progressing Flowsheets (Taken 02/05/2023 1535) Patient will transfer sit to/from stand: with supervision Goal: Pt Will Transfer Bed To Chair/Chair To Bed Outcome: Progressing Flowsheets (Taken 02/05/2023 1535) Pt will Transfer Bed to Chair/Chair to Bed: with supervision Goal: Pt Will Ambulate Outcome: Progressing Flowsheets (Taken 02/05/2023 1535) Pt will Ambulate:  50 feet  with contact guard assist  with rolling walker  with minimal assist   3:35 PM, 02/05/23 Ocie Bob, MPT Physical Therapist with [facility] [phone] office [phone] mobile phone

## 2023-02-05 NOTE — Evaluation (Addendum)
Physical Therapy Evaluation Patient Details Name: Daniel Crawford MRN: [id number] DOB: September 16, 1955 Today's Date: 02/05/2023  History of Present Illness  Mikolaj Cope is a 67 y.o. male with medical history significant of cirrhosis, CHF, CKD, depression, diabetes mellitus type 2, hypertension, hypothyroidism, and multiple admissions for hepatic encephalopathy.  Patient is not able to provide any history at the time of my exam.  He was quite agitated in the ED and received several sedating medications.  In the ICU he was still agitated and received a dose of Geodon.  I am told that before these doses he still was not able to provide any history.   Clinical Impression  Patient demonstrates slightly labored movement for sitting up at bedside, requires repeated verbal/tactile for safety due to impulsive behavior and fair/poor carryover for using RW due to picking it up during sit to stands.  Patient able to take a few slow labored steps at bedside before having to sit due to fatigue.  Patient put back to bed after therapy to be moved to 300 floor.  Patient will benefit from continued skilled physical therapy in hospital and recommended venue below to increase strength, balance, endurance for safe ADLs and gait.      If plan is discharge home, recommend the following: A lot of help with bathing/dressing/bathroom;A lot of help with walking and/or transfers;Help with stairs or ramp for entrance;Assistance with cooking/housework   Can travel by private vehicle   Yes    Equipment Recommendations None recommended by PT  Recommendations for Other Services       Functional Status Assessment Patient has had a recent decline in their functional status and demonstrates the ability to make significant improvements in function in a reasonable and predictable amount of time.     Precautions / Restrictions Precautions Precautions: Fall Restrictions Weight Bearing Restrictions: No      Mobility  Bed Mobility Overal  bed mobility: Needs Assistance Bed Mobility: Supine to Sit     Supine to sit: Contact guard, Min assist Sit to supine: Contact guard assist, Min assist   General bed mobility comments: increased time, labored movement    Transfers Overall transfer level: Needs assistance Equipment used: Rolling walker (2 wheels) Transfers: Sit to/from Stand, Bed to chair/wheelchair/BSC Sit to Stand: Min assist, Mod assist   Step pivot transfers: Min assist, Mod assist       General transfer comment: unsteady labord movement    Ambulation/Gait Ambulation/Gait assistance: Min assist, Mod assist Gait Distance (Feet): 10 Feet Assistive device: Rolling walker (2 wheels) Gait Pattern/deviations: Step-through pattern, Decreased step length - right, Decreased step length - left, Trunk flexed, Decreased stride length Gait velocity: decr     General Gait Details: limited to a few slow labored steps at bedside before having to sit due to fatigue and decreased safety due to mild confusion  Stairs            Wheelchair Mobility     Tilt Bed    Modified Rankin (Stroke Patients Only)       Balance Overall balance assessment: Needs assistance Sitting-balance support: Feet supported, No upper extremity supported Sitting balance-Leahy Scale: Fair Sitting balance - Comments: fair/good seated at EOB   Standing balance support: Reliant on assistive device for balance, During functional activity, Bilateral upper extremity supported Standing balance-Leahy Scale: Poor Standing balance comment: fair/poor using RW  Pertinent Vitals/Pain Pain Assessment Pain Assessment: No/denies pain    Home Living Family/patient expects to be discharged to:: Private residence Living Arrangements: Alone Available Help at Discharge: Friend(s);Available PRN/intermittently Type of Home: Apartment Home Access: Level entry       Home Layout: One level Home Equipment:  Rolling Walker (2 wheels);Grab bars - tub/shower;Cane - quad;Shower Diplomatic Services operational officer (4 wheels) Additional Comments: pt reports he has life alert as well    Prior Function Prior Level of Function : Independent/Modified Independent;History of Falls (last six months)             Mobility Comments: Leans on walls within the home; quad cane or rollator for longer distances ADLs Comments: Independent     Extremity/Trunk Assessment   Upper Extremity Assessment Upper Extremity Assessment: Generalized weakness    Lower Extremity Assessment Lower Extremity Assessment: Generalized weakness    Cervical / Trunk Assessment Cervical / Trunk Assessment: Normal  Communication   Communication Communication: No apparent difficulties  Cognition Arousal: Alert Behavior During Therapy: Impulsive Overall Cognitive Status: No family/caregiver present to determine baseline cognitive functioning                                 General Comments: Patient requires repeated verbal/tactile cuing for safety due to impulsive behavior and mild confusion        General Comments      Exercises     Assessment/Plan    PT Assessment Patient needs continued PT services  PT Problem List Decreased strength;Decreased activity tolerance;Decreased balance;Decreased mobility       PT Treatment Interventions DME instruction;Gait training;Functional mobility training;Therapeutic activities;Therapeutic exercise;Balance training;Patient/family education;Stair training    PT Goals (Current goals can be found in the Care Plan section)  Acute Rehab PT Goals Patient Stated Goal: return home with family to assist PT Goal Formulation: With patient Time For Goal Achievement: 02/19/23 Potential to Achieve Goals: Good    Frequency Min 3X/week     Co-evaluation               AM-PAC PT "6 Clicks" Mobility  Outcome Measure Help needed turning from your back to your side while in a  flat bed without using bedrails?: A Little Help needed moving from lying on your back to sitting on the side of a flat bed without using bedrails?: A Little Help needed moving to and from a bed to a chair (including a wheelchair)?: A Lot Help needed standing up from a chair using your arms (e.g., wheelchair or bedside chair)?: A Little Help needed to walk in hospital room?: A Lot Help needed climbing 3-5 steps with a railing? : A Lot 6 Click Score: 15    End of Session   Activity Tolerance: Patient tolerated treatment well Patient left: in bed;with call bell/phone within reach Nurse Communication: Mobility status PT Visit Diagnosis: Unsteadiness on feet (R26.81);Muscle weakness (generalized) (M62.81);Other abnormalities of gait and mobility (R26.89)    Time: 4540-9811 PT Time Calculation (min) (ACUTE ONLY): 23 min   Charges:   PT Evaluation $PT Eval Moderate Complexity: 1 Mod PT Treatments $Therapeutic Activity: 23-37 mins PT General Charges $$ ACUTE PT VISIT: 1 Visit         3:38 PM, 02/05/23 Ocie Bob, MPT Physical Therapist with [facility] 336 [phone] office [phone] mobile phone

## 2023-02-06 DIAGNOSIS — K7682 Hepatic encephalopathy: Secondary | ICD-10-CM | POA: Diagnosis not present

## 2023-02-06 LAB — BASIC METABOLIC PANEL
Anion gap: 6 (ref 5–15)
BUN: 37 mg/dL — ABNORMAL HIGH (ref 8–23)
CO2: 26 mmol/L (ref 22–32)
Calcium: 8.2 mg/dL — ABNORMAL LOW (ref 8.9–10.3)
Chloride: 99 mmol/L (ref 98–111)
Creatinine, Ser: 2.37 mg/dL — ABNORMAL HIGH (ref 0.61–1.24)
GFR, Estimated: 30 mL/min — ABNORMAL LOW (ref >60)
Glucose, Bld: 120 mg/dL — ABNORMAL HIGH (ref 70–99)
Potassium: 3.1 mmol/L — ABNORMAL LOW (ref 3.5–5.1)
Sodium: 131 mmol/L — ABNORMAL LOW (ref 135–145)

## 2023-02-06 LAB — GLUCOSE, CAPILLARY
Glucose-Capillary: 108 mg/dL — ABNORMAL HIGH (ref 70–99)
Glucose-Capillary: 152 mg/dL — ABNORMAL HIGH (ref 70–99)
Glucose-Capillary: 141 mg/dL — ABNORMAL HIGH (ref 70–99)
Glucose-Capillary: 130 mg/dL — ABNORMAL HIGH (ref 70–99)

## 2023-02-06 MED ORDER — POTASSIUM CHLORIDE CRYS ER 20 MEQ PO TBCR
40.0000 meq | EXTENDED_RELEASE_TABLET | ORAL | Status: AC
  Administered 2023-02-06 (×2): 40 meq via ORAL
  Filled 2023-02-06 (×2): qty 2

## 2023-02-06 NOTE — Plan of Care (Signed)
This pt is experiencing confusion and disorientation. Attempted to get out of bed on several occasions. Redirected pt and orientated him to the place and situation. Bed alarm and fall mat in place. Pt requested pain meds for bilateral lower extremity pain. Reassessed pain, pt stated he felt better.

## 2023-02-06 NOTE — Progress Notes (Addendum)
  Progress Note   Patient: Daniel Crawford ACZ:[id number] DOB: [birth year] DOA: 02/01/2023     4 DOS: the patient was seen and examined on 02/06/2023   Brief hospital admission course: As per H&P written by Dr.Zierle-Ghosh on 02/01/23  Daniel Crawford is a 67 y.o. male with medical history significant of cirrhosis, CHF, CKD, depression, diabetes mellitus type 2, hypertension, hypothyroidism, and multiple admissions for hepatic encephalopathy.  Patient is not able to provide any history at the time of my exam.  He was quite agitated in the ED and received several sedating medications.  In the ICU he was still agitated and received a dose of Geodon.  I am told that before these doses he still was not able to provide any history.   Assessment and Plan:  1)Hepatic Encephalopathy  -Ammonia 108 at time of admission -Ammonia around 66>>78>>55 -Mentation is improving with lactulose -Rifaximin restarted -Some concerns about noncompliance prior to admission the need to be compliant with lactulose and rifaximin emphasized to patient   2)Liver cirrhosis secondary to NASH  Management as above #1 -Prior to admission patient was on torsemide and Aldactone   3) chronic thrombocytopenia and chronic anemia --Due to #2 above -Hgb and platelets are relatively stable -No obvious bleeding at this time   4)DM2-- --A1c 5.6 reflecting excellent diabetic control PTA  -PTA patient was on Farxiga Use Novolog/Humalog Sliding scale insulin with Accu-Cheks/Fingersticks as ordered    5)Hypothyroidism---uncontrolled -TSH is up to 11.9  -Received IV levothyroxine this admission -Patient will need dose adjustment of levothyroxine after discharge  6)GERD (gastroesophageal reflux disease) -Continue protonix   7)Depression with anxiety -Continue as needed lorazepam -Resume treatment with Zoloft.   8)COPD (chronic obstructive pulmonary disease) (HCC) -VBG is not indicative of CO2 retention -Continue PTA bronchodilators    9)Chronic kidney disease stage IIIb -Renal function is close to baseline - renally adjust medications, avoid nephrotoxic agents / dehydration  / hypotension    10)HFpEF/Chronic diastolic heart failure -Echo from 01/23/2023 with EF of 55 to 60% stable and compensated -PTA patient was on Farxiga, torsemide and Aldactone -  11)Physical Deconditioning -Physical therapy eval appreciated recommends SNF rehab  -TOC working on possible SNF placement  12)Hypokalemia----replace and recheck  Subjective:  -More appropriate overall -Had confusion and agitation overnight required Haldol x 1 dose  Physical Exam: Vitals:   02/05/23 2014 02/06/23 0402 02/06/23 1416 02/06/23 1538  BP: 116/68 (!) 114/58 120/62   Pulse: 82 80 77   Resp: 20 16 20    Temp: 97.6 F (36.4 C) 97.9 F (36.6 C)  97.6 F (36.4 C)  TempSrc: Oral Oral  Axillary  SpO2: 98% 97% 98%   Weight:  (!) 152 kg    Height:        Physical Exam  Gen:- Awake Alert, in no acute distress , morbidly obese HEENT:- [place].AT, No sclera icterus Neck-Supple Neck,No JVD,.  Lungs-  CTAB , fair air movement bilaterally  CV- S1, S2 normal, RRR Abd-  +ve B.Sounds, Abd Soft, No tenderness, increased truncal adiposity Extremity/Skin:-Trace edema,   good pedal pulses  Psych-affect is appropriate, oriented x3 Neuro-generalized weakness, no new focal deficits, no tremors  Family Communication: No family at bedside.  Disposition: SNF  Status is: Inpatient Remains inpatient appropriate because: Continue treatment for acute hepatic encephalopathic process.     Author: Shon Hale, MD 02/06/2023 6:25 PM  For on call review [email or web address].

## 2023-02-06 NOTE — Progress Notes (Signed)
Physical Therapy Treatment Patient Details Name: Daniel Crawford MRN: [id number] DOB: 1955/09/26 Today's Date: 02/06/2023   History of Present Illness Daniel Crawford is a 67 y.o. male with medical history significant of cirrhosis, CHF, CKD, depression, diabetes mellitus type 2, hypertension, hypothyroidism, and multiple admissions for hepatic encephalopathy.  Patient is not able to provide any history at the time of my exam.  He was quite agitated in the ED and received several sedating medications.  In the ICU he was still agitated and received a dose of Geodon.  I am told that before these doses he still was not able to provide any history.    PT Comments  Pt tolerating today's treatment session, however continues with lethargy and reduced attention, intermittently closing eyes while talking to pt this session. Able to follow directions with consistent cuing. Today's session addressed functional transfer training for BLE strengthening an dynamic balancing with ambulation practice and turning for steadying during functional ambulation. Pt noted with primarily balance deficits both in static and dynamic positions secondary to reduced attention and muscle weakness. Pt showing consistent guarding for safety as benefited due to poor attention and drowsiness. Continue with current recommendations as well for DC setting as pt lives independently but shows increased risk for falls. Pt would continue to benefit from skilled acute physical therapy services in order to progress toward POC goals, safety/independence with functional mobility and QOL.     If plan is discharge home, recommend the following: A lot of help with bathing/dressing/bathroom;A lot of help with walking and/or transfers;Help with stairs or ramp for entrance;Assistance with cooking/housework   Can travel by private vehicle     Yes  Equipment Recommendations  None recommended by PT    Recommendations for Other Services       Precautions /  Restrictions Restrictions Weight Bearing Restrictions: No     Mobility  Bed Mobility               General bed mobility comments: Received in recliner Patient Response: Flat affect, Cooperative  Transfers Overall transfer level: Needs assistance Equipment used: Rolling walker (2 wheels) Transfers: Sit to/from Stand Sit to Stand: Contact guard assist           General transfer comment: Multiple sit/stands from recliner with consistent tactile and verbal cues given for BUE hand placement on handrails to power up to stand. 5-7x with CGA for steadying. In standing, pt showing anterior/posterior swaying. Slow, labord movements.    Ambulation/Gait Ambulation/Gait assistance: Contact guard assist Gait Distance (Feet): 20 Feet Assistive device: Rolling walker (2 wheels) Gait Pattern/deviations: Step-through pattern, Decreased step length - right, Decreased step length - left, Trunk flexed, Decreased stride length       General Gait Details: 26ft x 2 rounds with RW. CGA for balance. Reduced cadence with lateral swaying during ambulation. Pt needing seated rest break with increased swaying at end of distance ambulated. Pt given verbal and tactile cues given for turning with RW.   Stairs             Wheelchair Mobility     Tilt Bed Tilt Bed Patient Response: Flat affect, Cooperative  Modified Rankin (Stroke Patients Only)       Balance Overall balance assessment: Needs assistance Sitting-balance support: Feet supported, No upper extremity supported Sitting balance-Leahy Scale: Good Sitting balance - Comments: good/good seated at EOB   Standing balance support: Reliant on assistive device for balance, During functional activity, Bilateral upper extremity supported Standing balance-Leahy Scale: Fair Standing  balance comment: fair/fair standing balance with RW and without RW this tdate.                            Cognition Arousal: Alert Behavior  During Therapy: Impulsive Overall Cognitive Status: No family/caregiver present to determine baseline cognitive functioning                       Memory: Decreased short-term memory Following Commands: Follows one step commands consistently, Follows one step commands with increased time Safety/Judgement: Decreased awareness of deficits Awareness: Emergent Problem Solving: Slow processing, Requires verbal cues, Requires tactile cues General Comments: Patient requires repeated verbal/tactile cuing for safety due to impulsive behavior and mild confusion        Exercises      General Comments        Pertinent Vitals/Pain Pain Assessment Pain Assessment: No/denies pain    Home Living                          Prior Function            PT Goals (current goals can now be found in the care plan section) Acute Rehab PT Goals Patient Stated Goal: return home with family to assist PT Goal Formulation: With patient Time For Goal Achievement: 02/19/23 Potential to Achieve Goals: Good Progress towards PT goals: Progressing toward goals    Frequency    Min 3X/week      PT Plan      Co-evaluation              AM-PAC PT "6 Clicks" Mobility   Outcome Measure  Help needed turning from your back to your side while in a flat bed without using bedrails?: A Little Help needed moving from lying on your back to sitting on the side of a flat bed without using bedrails?: A Little Help needed moving to and from a bed to a chair (including a wheelchair)?: A Little Help needed standing up from a chair using your arms (e.g., wheelchair or bedside chair)?: A Little Help needed to walk in hospital room?: A Lot Help needed climbing 3-5 steps with a railing? : Total 6 Click Score: 15    End of Session Equipment Utilized During Treatment: Gait belt Activity Tolerance: Patient tolerated treatment well Patient left: in bed;with call bell/phone within reach Nurse  Communication: Mobility status PT Visit Diagnosis: Unsteadiness on feet (R26.81);Muscle weakness (generalized) (M62.81);Other abnormalities of gait and mobility (R26.89)     Time: 5621-3086 PT Time Calculation (min) (ACUTE ONLY): 30 min  Charges:    $Therapeutic Activity: 8-22 mins $Neuromuscular Re-education: 8-22 mins PT General Charges $$ ACUTE PT VISIT: 1 Visit                     Nelida Meuse PT, DPT Physical Therapist with [facility] [phone] office   Nelida Meuse 02/06/2023, 12:09 PM

## 2023-02-06 NOTE — Progress Notes (Signed)
Mobility Specialist Progress Note:    02/06/23 1055  Mobility  Activity Transferred from bed to chair;Ambulated with assistance in room  Level of Assistance Minimal assist, patient does 75% or more  Assistive Device Front wheel walker  Distance Ambulated (ft) 12 ft  Range of Motion/Exercises Active;All extremities  Activity Response Tolerated well  Mobility Referral Yes  $Mobility charge 1 Mobility  Mobility Specialist Start Time (ACUTE ONLY) 1055  Mobility Specialist Stop Time (ACUTE ONLY) 1105  Mobility Specialist Time Calculation (min) (ACUTE ONLY) 10 min   Pt received requesting assistance to transfer to chair. Required MinA to stand and ambulate with RW. Tolerated well, asx throughout. Left pt in chair, RN and NT in room. All needs met.   Lawerance Bach Mobility Specialist Please contact via Special educational needs teacher or  Rehab office at [phone]

## 2023-02-06 NOTE — TOC Progression Note (Signed)
Transition of Care [facility]) - Progression Note    Patient Details  Name: Daniel Crawford MRN: [id number] Date of Birth: 23-May-1955  Transition of Care [facility]) CM/SW Contact  Leitha Bleak, RN Phone Number: 02/06/2023, 11:16 AM  Clinical Narrative:   Bed offers pending, Patient is not medically ready, DC possibly Friday. TOC following to start INS AUTH.    Expected Discharge Plan: Skilled Nursing Facility Barriers to Discharge: Continued Medical Work up  Expected Discharge Plan and Services In-house Referral: Clinical Social Work   Post Acute Care Choice: Resumption of Svcs/PTA Provider Living arrangements for the past 2 months: Apartment                           HH Arranged: PT HH Agency: Other - See comment Producer, television/film/video) Date HH Agency Contacted: 02/04/23 Time HH Agency Contacted: [phone] Representative spoke with at [facility] Agency: Maralyn Sago   Social Determinants of Health (SDOH) Interventions SDOH Screenings   Food Insecurity: No Food Insecurity (01/21/2023)  Housing: Low Risk  (01/21/2023)  Transportation Needs: No Transportation Needs (01/21/2023)  Utilities: Not At Risk (01/21/2023)  Depression (PHQ2-9): Medium Risk (10/27/2019)  Financial Resource Strain: Low Risk  (03/28/2021)   Received from [facility]  Tobacco Use: Medium Risk (02/01/2023)    Readmission Risk Interventions    02/04/2023    9:32 AM 12/21/2022    9:17 AM 10/20/2022    2:15 PM  Readmission Risk Prevention Plan  Transportation Screening Complete Complete Complete  Medication Review Oceanographer) Complete Complete Complete  PCP or Specialist appointment within 3-5 days of discharge   Not Complete  HRI or Home Care Consult Complete Complete Complete  SW Recovery Care/Counseling Consult Complete Complete Complete  Palliative Care Screening Not Applicable Not Applicable Not Applicable  Skilled Nursing Facility Not Applicable Not Applicable Not Applicable

## 2023-02-06 NOTE — Progress Notes (Signed)
Informed Dr. Thomes Dinning lab called concerned about pt's creatinine and bun increasing, sodium dropping at 0700. No new orders noted in Epic  Cr: 2.7 Bun: 37 Sodium: 131

## 2023-02-07 ENCOUNTER — Telehealth: Payer: Self-pay | Admitting: Pharmacist

## 2023-02-07 ENCOUNTER — Ambulatory Visit: Payer: Medicare HMO | Attending: Cardiology

## 2023-02-07 ENCOUNTER — Telehealth: Payer: Self-pay | Admitting: Pharmacy Technician

## 2023-02-07 ENCOUNTER — Encounter (HOSPITAL_COMMUNITY): Payer: Medicare HMO

## 2023-02-07 ENCOUNTER — Other Ambulatory Visit (HOSPITAL_COMMUNITY): Payer: Self-pay

## 2023-02-07 DIAGNOSIS — K7682 Hepatic encephalopathy: Secondary | ICD-10-CM | POA: Diagnosis not present

## 2023-02-07 LAB — COMPREHENSIVE METABOLIC PANEL
ALT: 38 U/L (ref 0–44)
AST: 90 U/L — ABNORMAL HIGH (ref 15–41)
Albumin: 2.9 g/dL — ABNORMAL LOW (ref 3.5–5.0)
Alkaline Phosphatase: 75 U/L (ref 38–126)
Anion gap: 13 (ref 5–15)
BUN: 37 mg/dL — ABNORMAL HIGH (ref 8–23)
CO2: 24 mmol/L (ref 22–32)
Calcium: 8.8 mg/dL — ABNORMAL LOW (ref 8.9–10.3)
Chloride: 94 mmol/L — ABNORMAL LOW (ref 98–111)
Creatinine, Ser: 2.55 mg/dL — ABNORMAL HIGH (ref 0.61–1.24)
GFR, Estimated: 27 mL/min — ABNORMAL LOW (ref >60)
Glucose, Bld: 115 mg/dL — ABNORMAL HIGH (ref 70–99)
Potassium: 3.5 mmol/L (ref 3.5–5.1)
Sodium: 131 mmol/L — ABNORMAL LOW (ref 135–145)
Total Bilirubin: 6.4 mg/dL — ABNORMAL HIGH (ref <1.2)
Total Protein: 6.4 g/dL — ABNORMAL LOW (ref 6.5–8.1)

## 2023-02-07 LAB — CBC
HCT: 28.9 % — ABNORMAL LOW (ref 39.0–52.0)
Hemoglobin: 9.3 g/dL — ABNORMAL LOW (ref 13.0–17.0)
MCH: 28.3 pg (ref 26.0–34.0)
MCHC: 32.2 g/dL (ref 30.0–36.0)
MCV: 87.8 fL (ref 80.0–100.0)
Platelets: 64 10*3/uL — ABNORMAL LOW (ref 150–400)
RBC: 3.29 MIL/uL — ABNORMAL LOW (ref 4.22–5.81)
RDW: 17.5 % — ABNORMAL HIGH (ref 11.5–15.5)
WBC: 4 10*3/uL (ref 4.0–10.5)
nRBC: 0 % (ref 0.0–0.2)

## 2023-02-07 LAB — GLUCOSE, CAPILLARY
Glucose-Capillary: 131 mg/dL — ABNORMAL HIGH (ref 70–99)
Glucose-Capillary: 127 mg/dL — ABNORMAL HIGH (ref 70–99)
Glucose-Capillary: 132 mg/dL — ABNORMAL HIGH (ref 70–99)

## 2023-02-07 MED ORDER — LEVOTHYROXINE SODIUM 88 MCG PO TABS
88.0000 ug | ORAL_TABLET | Freq: Every day | ORAL | Status: DC
  Administered 2023-02-08: 88 ug via ORAL
  Filled 2023-02-07: qty 1

## 2023-02-07 NOTE — Progress Notes (Signed)
Physical Therapy Treatment Patient Details Name: Daniel Crawford MRN: [id number] DOB: 1956/03/16 Today's Date: 02/07/2023   History of Present Illness Daniel Crawford is a 67 y.o. male with medical history significant of cirrhosis, CHF, CKD, depression, diabetes mellitus type 2, hypertension, hypothyroidism, and multiple admissions for hepatic encephalopathy.  Patient is not able to provide any history at the time of my exam.  He was quite agitated in the ED and received several sedating medications.  In the ICU he was still agitated and received a dose of Geodon.  I am told that before these doses he still was not able to provide any history.    PT Comments  Pt sitting in chair and willing to participate with therapy.  Pt with CGA for transfer and gait mechanics, used RW for stability with no LOB during episode.  DOes present with slow cadence and increased time required to turn around and head back to room.  EOS pt left in chair with call bell within reach and chair alarm set.  No reports of increased pain.    If plan is discharge home, recommend the following:     Can travel by private vehicle        Equipment Recommendations       Recommendations for Other Services       Precautions / Restrictions Precautions Precautions: Fall Restrictions Weight Bearing Restrictions: No     Mobility  Bed Mobility               General bed mobility comments: Pt sitting in chair upon therapist entrance    Transfers Overall transfer level: Modified independent Equipment used: Rolling walker (2 wheels) Transfers: Sit to/from Stand Sit to Stand: Contact guard assist           General transfer comment: Cueing for hand placement to assist with STS    Ambulation/Gait Ambulation/Gait assistance: Contact guard assist Gait Distance (Feet): 30 Feet Assistive device: Rolling walker (2 wheels) Gait Pattern/deviations: Step-through pattern, Decreased step length - right, Decreased step length - left,  Trunk flexed, Decreased stride length Gait velocity: slow velocity     General Gait Details: Improved activity tolerance and increased cadence with no LOB, increased time with turn around   Stairs             Wheelchair Mobility     Tilt Bed    Modified Rankin (Stroke Patients Only)       Balance                                            Cognition Arousal: Alert Behavior During Therapy: WFL for tasks assessed/performed Overall Cognitive Status: No family/caregiver present to determine baseline cognitive functioning                                          Exercises      General Comments        Pertinent Vitals/Pain Pain Assessment Pain Assessment: 0-10 Pain Score: 8  Pain Descriptors / Indicators: Aching, Discomfort Pain Intervention(s): Monitored during session, Limited activity within patient's tolerance, Repositioned    Home Living                          Prior Function  PT Goals (current goals can now be found in the care plan section)      Frequency           PT Plan      Co-evaluation              AM-PAC PT "6 Clicks" Mobility   Outcome Measure  Help needed turning from your back to your side while in a flat bed without using bedrails?: A Little Help needed moving from lying on your back to sitting on the side of a flat bed without using bedrails?: A Little Help needed moving to and from a bed to a chair (including a wheelchair)?: A Little Help needed standing up from a chair using your arms (e.g., wheelchair or bedside chair)?: A Little Help needed to walk in hospital room?: A Little Help needed climbing 3-5 steps with a railing? : Total 6 Click Score: 16    End of Session   Activity Tolerance: Patient tolerated treatment well Patient left: in chair;with chair alarm set;with call bell/phone within reach Nurse Communication: Mobility status       Time:  0842-0900 PT Time Calculation (min) (ACUTE ONLY): 18 min  Charges:    $Therapeutic Activity: 8-22 mins PT General Charges $$ ACUTE PT VISIT: 1 Visit                     Becky Sax, LPTA/CLT; CBIS [phone]  Juel Burrow 02/07/2023, 12:50 PM

## 2023-02-07 NOTE — Progress Notes (Deleted)
Patient ID: Daniel Crawford                 DOB: 06-02-1955                    MRN: [id number]     HPI: Daniel Crawford is a 67 y.o. male patient referred to pharmacy clinic by *** to initiate GLP1-RA therapy. PMH is significant for NASH cirrhosis, chronic diastolic CHF, DM2, CKD stage 3, and COPD , and obesity. Patient has cirrhosis due to NASH with splenomegaly, thrombocytopenia, and h/o hepatic encephalopathy. Liver biopsy in 1/23 showed stage IV cirrhosis Most recent BMI ***.  Baseline weight and BMI: 324 lbs 41.68 Current weight and BMI: *** Current meds that affect weight: insulin   *** If diabetic and on insulin/sulfonylurea, can consider reducing dose to reduce risk of hypoglycemia  *** Follow-up visit  Assess % weight loss Assess adverse effects Missed doses  Diet:   Exercise:   Family History:  Relation Problem Comments  Mother (Deceased) Heart disease   Liver cancer     Father Metallurgist)   Sister Metallurgist)   Sister (Deceased) Aneurysm      Social History:   Labs: Lab Results  Component Value Date   HGBA1C 5.6 02/03/2023    Wt Readings from Last 1 Encounters:  02/07/23 (!) 332 lb 7.3 oz (150.8 kg)    BP Readings from Last 1 Encounters:  02/07/23 105/63   Pulse Readings from Last 1 Encounters:  02/07/23 83    No results found for: "CHOL", "TRIG", "HDL", "CHOLHDL", "VLDL", "LDLCALC", "LDLDIRECT"  Past Medical History:  Diagnosis Date   Acute pancreatitis 10/02/2022   Anasarca 01/23/2022   Anemia    Anxiety    Asthma    CHF (congestive heart failure) (HCC)    CKD (chronic kidney disease)    D-dimer, elevated 01/22/2022   Decompensation of cirrhosis of liver (HCC) 05/21/2022   Depression    Diabetes mellitus without complication (HCC)    Dyspnea    Hepatic encephalopathy (HCC) 07/10/2022   Hypertension    Hypothyroidism    Liver cirrhosis secondary to NASH (HCC)    NASH (nonalcoholic steatohepatitis)    Other ascites 06/19/2019   Pancytopenia (HCC)  05/21/2022   Pre-diabetes    Spleen enlarged    Thrombocytopenia (HCC)     Current Facility-Administered Medications on File Prior to Visit  Medication Dose Route Frequency Provider Last Rate Last Admin   acetaminophen (TYLENOL) tablet 650 mg  650 mg Oral Q6H PRN Vassie Loll, MD   650 mg at 02/07/23 0319   Or   acetaminophen (TYLENOL) suppository 650 mg  650 mg Rectal Q6H PRN Vassie Loll, MD       albuterol (PROVENTIL) (2.5 MG/3ML) 0.083% nebulizer solution 2.5 mg  2.5 mg Nebulization Q4H PRN Vassie Loll, MD       Chlorhexidine Gluconate Cloth 2 % PADS 6 each  6 each Topical Q0600 Vassie Loll, MD   6 each at 02/07/23 1324   haloperidol lactate (HALDOL) injection 2 mg  2 mg Intravenous Q6H PRN Vassie Loll, MD   2 mg at 02/06/23 2317   insulin aspart (novoLOG) injection 0-15 Units  0-15 Units Subcutaneous TID WC Vassie Loll, MD   2 Units at 02/06/23 1637   insulin aspart (novoLOG) injection 0-5 Units  0-5 Units Subcutaneous QHS Vassie Loll, MD       lactulose (CHRONULAC) 10 GM/15ML solution 30 g  30 g Oral TID Vassie Loll, MD  30 g at 02/06/23 2218   levothyroxine (SYNTHROID, LEVOTHROID) injection 50 mcg  50 mcg Intravenous Daily Vassie Loll, MD   50 mcg at 02/06/23 0810   LORazepam (ATIVAN) injection 1 mg  1 mg Intravenous Q4H PRN Vassie Loll, MD   1 mg at 02/05/23 2106   nystatin (MYCOSTATIN/NYSTOP) topical powder   Topical TID Vassie Loll, MD   Given at 02/06/23 1637   ondansetron (ZOFRAN) tablet 4 mg  4 mg Oral Q6H PRN Vassie Loll, MD       Or   ondansetron [facility]) injection 4 mg  4 mg Intravenous Q6H PRN Vassie Loll, MD       Oral care mouth rinse  15 mL Mouth Rinse PRN Vassie Loll, MD       pantoprazole (PROTONIX) EC tablet 40 mg  40 mg Oral Daily Vassie Loll, MD   40 mg at 02/06/23 9562   rifaximin [employer]) tablet 550 mg  550 mg Oral BID Vassie Loll, MD   550 mg at 02/06/23 2218   sertraline (ZOLOFT) tablet 100 mg  100 mg Oral Daily  Vassie Loll, MD   100 mg at 02/06/23 1308   Current Outpatient Medications on File Prior to Visit  Medication Sig Dispense Refill   acetaminophen (TYLENOL) 500 MG tablet Take 1,000 mg by mouth every 6 (six) hours as needed for moderate pain.     albuterol (VENTOLIN HFA) 108 (90 Base) MCG/ACT inhaler Inhale 2 puffs into the lungs every 6 (six) hours as needed for wheezing or shortness of breath. 18 g 2   dapagliflozin propanediol (FARXIGA) 10 MG TABS tablet Take 1 tablet (10 mg total) by mouth daily before breakfast. 90 tablet 3   gabapentin (NEURONTIN) 100 MG capsule Take 100 mg by mouth daily as needed (pain).     hydrOXYzine (VISTARIL) 50 MG capsule Take 1 capsule (50 mg total) by mouth 3 (three) times daily as needed for anxiety or nausea. 90 capsule 2   lactose free nutrition (BOOST) LIQD Take 237 mLs by mouth every evening.     lactulose (CHRONULAC) 10 GM/15ML solution Take 30 mLs (20 g total) by mouth 2 (two) times daily. 1800 mL 0   levothyroxine (SYNTHROID) 88 MCG tablet Take 1 tablet (88 mcg total) by mouth daily before breakfast. 30 tablet 1   ondansetron (ZOFRAN) 4 MG tablet Take 4 mg by mouth 2 (two) times daily as needed for nausea or vomiting.     pantoprazole (PROTONIX) 40 MG tablet Take 1 tablet (40 mg total) by mouth daily. 90 tablet 3   polyethylene glycol (MIRALAX / GLYCOLAX) 17 g packet Take 17 g by mouth daily as needed for mild constipation.     potassium chloride SA (KLOR-CON M) 20 MEQ tablet Take 2 tablets (40 mEq total) by mouth 2 (two) times daily. 180 tablet 3   sertraline (ZOLOFT) 100 MG tablet Take 100 mg by mouth daily.     spironolactone (ALDACTONE) 100 MG tablet Take 100 mg by mouth daily.     torsemide (DEMADEX) 20 MG tablet Take 4 tablets (80 mg total) by mouth 2 (two) times daily. 240 tablet 1   traZODone (DESYREL) 50 MG tablet Take 1 tablet (50 mg total) by mouth at bedtime. (Patient taking differently: Take 100 mg by mouth at bedtime.) 90 tablet 3    No  Known Allergies   Assessment/Plan:  1. Weight loss - Patient has not met goal of at least 5% of body weight loss with comprehensive  lifestyle modifications alone in the past 3-6 months. Pharmacotherapy is appropriate to pursue as augmentation. Will start ***. Confirmed patient not ***pregnant and no personal or family history of medullary thyroid carcinoma (MTC) or Multiple Endocrine Neoplasia syndrome type 2 (MEN 2). Injection technique reviewed at today's visit.  Advised patient on common side effects including nausea, diarrhea, dyspepsia, decreased appetite, and fatigue. Counseled patient on reducing meal size and how to titrate medication to minimize side effects. Counseled patient to call if intolerable side effects or if experiencing dehydration, abdominal pain, or dizziness. Patient will adhere to dietary modifications and will target at least 150 minutes of moderate intensity exercise weekly.   Follow up in 1 month via telephone for tolerability update and dose titration.

## 2023-02-07 NOTE — Progress Notes (Addendum)
Assisted pt at 0115 with returning back to bed from using bedpan, pt feet became entangled with blanket and slid to the floor. Safety floor mat in was in place. Pt did not hit head, no injuries noted. Nurse tech and staff assisted with helping pt back in bed. Pt stated he experienced no pain, or discomfort. On call physician Gilles Chiquito and Charge nurse notified of fall. Will continue to monitor pt for any changes.

## 2023-02-07 NOTE — Telephone Encounter (Signed)
Pharmacy Patient Advocate Encounter  Insurance verification completed.    The patient is insured through [employer] test claim for ozempic. Currently a quantity of 3 ml is a 28 day supply and the co-pay is 11.20 .  No prior auth needed  This test claim was processed through [facility]- copay amounts [place] vary at other pharmacies due to pharmacy/plan contracts, or as the patient moves through the different stages of their insurance plan.

## 2023-02-07 NOTE — Progress Notes (Signed)
Progress Note   Patient: Daniel Crawford MVH:[id number] DOB: November 26, 1955 DOA: 02/01/2023     5 DOS: the patient was seen and examined on 02/07/2023   Brief hospital admission course: As per H&P written by Dr.Zierle-Ghosh on 02/01/23  Daniel Crawford is a 67 y.o. male with medical history significant of cirrhosis, CHF, CKD, depression, diabetes mellitus type 2, hypertension, hypothyroidism, and multiple admissions for hepatic encephalopathy.  Patient is not able to provide any history at the time of my exam.  He was quite agitated in the ED and received several sedating medications.  In the ICU he was still agitated and received a dose of Geodon.  I am told that before these doses he still was not able to provide any history.  - Pt is Medically ready for discharge to SNF facility when SNF bed can be obtained and insurance authorization is available  Assessment and Plan:  1)Hepatic Encephalopathy  -Ammonia 108 at time of admission -Ammonia around 66>>78>>55 -Mentation is improving with lactulose -Continue rifaximin and lactulose -Some concerns about noncompliance prior to admission the need to be compliant with lactulose and rifaximin emphasized to patient   2)Liver cirrhosis secondary to NASH  Management as above #1 -Prior to admission patient was on torsemide and Aldactone   3) chronic thrombocytopenia and chronic anemia --Due to #2 above -Hgb and platelets are relatively stable -No obvious bleeding at this time   4)DM2-- --A1c 5.6 reflecting excellent diabetic control PTA  -PTA patient was on Farxiga Use Novolog/Humalog Sliding scale insulin with Accu-Cheks/Fingersticks as ordered    5)Hypothyroidism---uncontrolled -TSH is up to 11.9  -Received IV levothyroxine this admission -Patient will need dose adjustment of levothyroxine after discharge  6)GERD (gastroesophageal reflux disease) -Continue protonix   7)Depression with anxiety -Continue as needed lorazepam C/n  Zoloft.   8)COPD  (chronic obstructive pulmonary disease) (HCC) -VBG is not indicative of CO2 retention -Continue PTA bronchodilators   9)Chronic kidney disease stage IIIb -Renal function is close to baseline - renally adjust medications, avoid nephrotoxic agents / dehydration  / hypotension   10)HFpEF/Chronic diastolic heart failure -Echo from 01/23/2023 with EF of 55 to 60% stable and compensated -PTA patient was on Farxiga, torsemide and Aldactone -  11)Physical Deconditioning -Physical therapy eval appreciated recommends SNF rehab  -TOC working on possible SNF placement  12)Hypokalemia----replace and recheck   Subjective:  -More coherent and more cooperative -Had BMs No fever  Or chills  -No nausea or vomiting Pt is Medically ready for discharge to SNF facility when SNF bed can be obtained and insurance authorization is available  Physical Exam: Vitals:   02/06/23 1933 02/07/23 0120 02/07/23 0500 02/07/23 1434  BP: (!) 112/59 138/60 105/63 (!) 112/57  Pulse: 75 74 83 82  Resp: 16 (!) 22 20   Temp: 98 F (36.7 C)  97.7 F (36.5 C) 98.6 F (37 C)  TempSrc:    Oral  SpO2: 100% 98% 97% 98%  Weight:   (!) 150.8 kg   Height:       General exam: Alert, awake, in acute distress  respiratory system: Clear to auscultation. Respiratory effort normal.  Good saturation on room air. Cardiovascular system: S1, S2, RRR.  Gastrointestinal system: Abdomen is obese, nondistended, soft and nontender.  Normal bowel sounds heard. Central nervous system: 4 limbs spontaneously.  No focal neurological deficits. Extremities: No cyanosis or clubbing; trace to 1+ edema appreciated bilaterally. Skin: No petechiae; multiple bruises and abrasion appreciated in his limbs. Psychiatry: Affect is appropriate, alert and  oriented x 3  Family Communication: No family at bedside.  Disposition: Discharge to SNF facility when bed available Status is: Inpatient   Author: Shon Hale, MD 02/07/2023 6:54  PM  For on call review [email or web address].

## 2023-02-07 NOTE — Plan of Care (Signed)
Pt experienced a restless night with troubling sleeping. Pt given PRN relaxation meds which did not assist with relaxing or sleeping. Pt did experience two bowels movements. While assisting pt off bedside commode, pts legs became entangled in blanket, he slid and fell on safety floor mat. Pt did not injury himself or hit his head. Charge nurse and DO were informed of incident. Pt was monitored throughout night shift. Pt is now resting, no signs or vocal expressions of pain or discomfort. Will continue to monitor pt for the duration of nursing shift.

## 2023-02-08 ENCOUNTER — Telehealth (HOSPITAL_COMMUNITY): Payer: Self-pay | Admitting: Pharmacy Technician

## 2023-02-08 ENCOUNTER — Encounter (HOSPITAL_COMMUNITY): Payer: Medicare HMO

## 2023-02-08 DIAGNOSIS — K7682 Hepatic encephalopathy: Secondary | ICD-10-CM | POA: Diagnosis not present

## 2023-02-08 LAB — GLUCOSE, CAPILLARY
Glucose-Capillary: 151 mg/dL — ABNORMAL HIGH (ref 70–99)
Glucose-Capillary: 140 mg/dL — ABNORMAL HIGH (ref 70–99)

## 2023-02-08 MED ORDER — HYDROXYZINE PAMOATE 50 MG PO CAPS: 50.0000 mg | ORAL_CAPSULE | Freq: Three times a day (TID) | ORAL | 2 refills | Status: AC | PRN

## 2023-02-08 MED ORDER — POTASSIUM CHLORIDE CRYS ER 20 MEQ PO TBCR: 40.0000 meq | EXTENDED_RELEASE_TABLET | Freq: Every day | ORAL | 1 refills | Status: DC

## 2023-02-08 MED ORDER — RIFAXIMIN 550 MG PO TABS: 550.0000 mg | ORAL_TABLET | Freq: Two times a day (BID) | ORAL | 2 refills | Status: DC

## 2023-02-08 MED ORDER — ACETAMINOPHEN 325 MG PO TABS: 650.0000 mg | ORAL_TABLET | Freq: Four times a day (QID) | ORAL | 0 refills | Status: AC | PRN

## 2023-02-08 MED ORDER — SPIRONOLACTONE 100 MG PO TABS: 100.0000 mg | ORAL_TABLET | Freq: Every day | ORAL | 2 refills | Status: DC

## 2023-02-08 MED ORDER — TORSEMIDE 20 MG PO TABS: 40.0000 mg | ORAL_TABLET | Freq: Two times a day (BID) | ORAL | 4 refills | Status: DC

## 2023-02-08 MED ORDER — NYSTATIN 100000 UNIT/GM EX POWD: Freq: Three times a day (TID) | CUTANEOUS | 0 refills | Status: DC

## 2023-02-08 MED ORDER — TRAZODONE HCL 50 MG PO TABS: 50.0000 mg | ORAL_TABLET | Freq: Every day | ORAL | 3 refills | Status: AC

## 2023-02-08 MED ORDER — DAPAGLIFLOZIN PROPANEDIOL 10 MG PO TABS: 10.0000 mg | ORAL_TABLET | Freq: Every day | ORAL | 3 refills | Status: DC

## 2023-02-08 MED ORDER — LEVOTHYROXINE SODIUM 100 MCG PO TABS: 100.0000 ug | ORAL_TABLET | Freq: Every day | ORAL | 3 refills | Status: DC

## 2023-02-08 MED ORDER — ALBUTEROL SULFATE HFA 108 (90 BASE) MCG/ACT IN AERS: 2.0000 | INHALATION_SPRAY | Freq: Four times a day (QID) | RESPIRATORY_TRACT | 2 refills | Status: DC | PRN

## 2023-02-08 MED ORDER — LACTULOSE 10 GM/15ML PO SOLN: 30.0000 g | Freq: Three times a day (TID) | ORAL | 2 refills | Status: DC

## 2023-02-08 NOTE — Discharge Summary (Addendum)
Daniel Crawford, is a 67 y.o. male  DOB December 23, 1955  MRN [id number].  Admission date:  02/01/2023  Admitting Physician  Lilyan Gilford, DO  Discharge Date:  02/08/2023   Primary MD  [name], MD  Recommendations for primary care physician for things to follow:  1)Please take Lactulose as prescribed----with a goal of having about 3 soft/mushy semisolid bowel movements per day, avoid constipation as this will lead to hepatic encephalopathy/confusional episodes  2)Repeat CBC and CMP blood tests in 3 to 5 days with primary care physician advised  3)Avoid ibuprofen/Advil/Aleve/Motrin/Goody Powders/Naproxen/BC powders/Meloxicam/Diclofenac/Indomethacin and other Nonsteroidal anti-inflammatory medications as these will make you more likely to bleed and can cause stomach ulcers, can also cause Kidney problems.    Admission Diagnosis  Hepatic encephalopathy (HCC) [K76.82] Tachycardia [R00.0] Altered mental status, unspecified altered mental status type [R41.82]   Discharge Diagnosis  Hepatic encephalopathy (HCC) [K76.82] Tachycardia [R00.0] Altered mental status, unspecified altered mental status type [R41.82]    Principal Problem:   Hepatic encephalopathy (HCC) Active Problems:   Essential hypertension   Liver cirrhosis secondary to NASH (HCC)   T2DM (type 2 diabetes mellitus) (HCC)   Acquired hypothyroidism   GERD (gastroesophageal reflux disease)   Depression with anxiety   COPD (chronic obstructive pulmonary disease) (HCC)      Past Medical History:  Diagnosis Date   Acute pancreatitis 10/02/2022   Anasarca 01/23/2022   Anemia    Anxiety    Asthma    CHF (congestive heart failure) (HCC)    CKD (chronic kidney disease)    D-dimer, elevated 01/22/2022   Decompensation of cirrhosis of liver (HCC) 05/21/2022   Depression    Diabetes mellitus without complication (HCC)    Dyspnea    Hepatic  encephalopathy (HCC) 07/10/2022   Hypertension    Hypothyroidism    Liver cirrhosis secondary to NASH (HCC)    NASH (nonalcoholic steatohepatitis)    Other ascites 06/19/2019   Pancytopenia (HCC) 05/21/2022   Pre-diabetes    Spleen enlarged    Thrombocytopenia (HCC)     Past Surgical History:  Procedure Laterality Date   Bilateral hernia surgery     2006, 2007   CHOLECYSTECTOMY     2016    COLONOSCOPY WITH PROPOFOL N/A 06/28/2021   Procedure: COLONOSCOPY WITH PROPOFOL;  Surgeon: Malissa Hippo, MD;  [facility];  Service: Endoscopy;  Laterality: N/A;  1020   ESOPHAGEAL DILATION N/A 11/06/2019   Procedure: ESOPHAGEAL DILATION;  Surgeon: Dolores Frame, MD;  Location: [facility];  Service: Gastroenterology;  Laterality: N/A;   ESOPHAGOGASTRODUODENOSCOPY (EGD) WITH PROPOFOL N/A 11/06/2019   Procedure: ESOPHAGOGASTRODUODENOSCOPY (EGD) WITH PROPOFOL;  Surgeon: Dolores Frame, MD;  Location: [facility];  Service: Gastroenterology;  Laterality: N/A;  1045   IR RADIOLOGIST EVAL & MGMT  02/19/2022   LAPAROSCOPIC ASSISTED VENTRAL HERNIA REPAIR     Polyp removed     in January of 2018.    POLYPECTOMY  06/28/2021   Procedure: POLYPECTOMY INTESTINAL;  Surgeon: Malissa Hippo,  MD;  Location: [facility];  Service: Endoscopy;;   RIGHT HEART CATH N/A 01/08/2022   Procedure: RIGHT HEART CATH;  Surgeon: Corky Crafts, MD;  Location: [facility] INVASIVE CV LAB;  Service: Cardiovascular;  Laterality: N/A;   RIGHT HEART CATH N/A 01/29/2023   Procedure: RIGHT HEART CATH;  Surgeon: Laurey Morale, MD;  Location: [facility] INVASIVE CV LAB;  Service: Cardiovascular;  Laterality: N/A;    HPI  from the history and physical done on the day of admission:     HPI: Daniel Crawford is a 67 y.o. male with medical history significant of cirrhosis, CHF, CKD, depression, diabetes mellitus type 2, hypertension, hypothyroidism, and multiple admissions for hepatic encephalopathy.  Patient  is not able to provide any history at the time of my exam.  He was quite agitated in the ED and received several sedating medications.  In the ICU he was still agitated and received a dose of Geodon.  I am told that before these doses he still was not able to provide any history. Review of Systems: unable to review all systems due to the inability of the patient to answer questions.   Hospital Course:    Assessment and Plan:  1)Hepatic Encephalopathy  -Ammonia 108 at time of admission -Ammonia around 66>>78>>55 -Overall much improved with lactulose -Rifaximin restarted -The need to be compliant with lactulose and rifaximin emphasized to patient   2)Liver cirrhosis secondary to NASH  Management as above #1 -Torsemide and Aldactone as prescribed  3)chronic thrombocytopenia and chronic anemia --Due to #2 above -Hgb and platelets are relatively stable -No obvious bleeding at this time   4)DM2-- --A1c 5.6 reflecting excellent diabetic control PTA  -Farxiga   5)Hypothyroidism---uncontrolled -TSH is up to 11.9  -Change levothyroxine 100 mcg daily from 88 mcg daily   6)GERD (gastroesophageal reflux disease) -Continue protonix   7)Depression with anxiety -Continue as needed lorazepam as initiated. -Resume treatment with Zoloft.   8)COPD (chronic obstructive pulmonary disease) (HCC) -VBG is not indicative of CO2 retention -Continue PTA bronchodilators   9)Chronic kidney disease stage IIIb -Renal function is close to baseline - renally adjust medications, avoid nephrotoxic agents / dehydration  / hypotension -Repeat BMP within 3 to 5 days with PCP   10)HFpEF/Chronic diastolic heart failure -Echo from 01/23/2023 with EF of 55 to 60% stable and compensated -Continue Farxiga -Torsemide adjusted -Continue Aldactone -  11)Physical Deconditioning -Physical therapy eval appreciated recommends SNF rehab patient declines SNF rehab -- Okay to discharge home with home health services  per patient's preference  Discharge Condition: stable  Follow UP   Follow-up Information     [facility] Follow up.   Why: They will call to schedule your next visit.        [name], MD. Schedule an appointment as soon as possible for a visit in 5 day(s).   Specialties: Family Medicine, Sports Medicine Why: CBC and CMP Blood Tests Contact information: [address] [place] [address] [phone]                 Diet and Activity recommendation:  As advised  Discharge Instructions    Discharge Instructions     Call MD for:  difficulty breathing, headache or visual disturbances   Complete by: As directed    Call MD for:  persistant dizziness or light-headedness   Complete by: As directed    Call MD for:  persistant nausea and vomiting   Complete by: As directed  Call MD for:  temperature >100.4   Complete by: As directed    Diet - low sodium heart healthy   Complete by: As directed    Diet Carb Modified   Complete by: As directed    Discharge instructions   Complete by: As directed    1)Please take Lactulose as prescribed----with a goal of having about 3 soft/mushy semisolid bowel movements per day, avoid constipation as this will lead to hepatic encephalopathy/confusional episodes  2)Repeat CBC and CMP blood tests in 3 to 5 days with primary care physician advised  3)Avoid ibuprofen/Advil/Aleve/Motrin/Goody Powders/Naproxen/BC powders/Meloxicam/Diclofenac/Indomethacin and other Nonsteroidal anti-inflammatory medications as these will make you more likely to bleed and can cause stomach ulcers, can also cause Kidney problems.   Increase activity slowly   Complete by: As directed         Discharge Medications     Allergies as of 02/08/2023   No Known Allergies      Medication List     STOP taking these medications    gabapentin 100 MG capsule Commonly known as: NEURONTIN   polyethylene glycol 17 g packet Commonly  known as: MIRALAX / GLYCOLAX       TAKE these medications    acetaminophen 325 MG tablet Commonly known as: TYLENOL Take 2 tablets (650 mg total) by mouth every 6 (six) hours as needed for mild pain (pain score 1-3), fever or headache (or Fever >/= 101). What changed:  medication strength how much to take reasons to take this   albuterol 108 (90 Base) MCG/ACT inhaler Commonly known as: VENTOLIN HFA Inhale 2 puffs into the lungs every 6 (six) hours as needed for wheezing or shortness of breath.   dapagliflozin propanediol 10 MG Tabs tablet Commonly known as: Farxiga Take 1 tablet (10 mg total) by mouth daily before breakfast.   hydrOXYzine 50 MG capsule Commonly known as: VISTARIL Take 1 capsule (50 mg total) by mouth 3 (three) times daily as needed for anxiety or nausea.   lactose free nutrition Liqd Take 237 mLs by mouth every evening.   lactulose 10 GM/15ML solution Commonly known as: CHRONULAC Take 45 mLs (30 g total) by mouth 3 (three) times daily. What changed:  how much to take when to take this   levothyroxine 88 MCG tablet Commonly known as: SYNTHROID Take 1 tablet (88 mcg total) by mouth daily before breakfast.   nystatin powder Commonly known as: MYCOSTATIN/NYSTOP Apply topically 3 (three) times daily.   ondansetron 4 MG tablet Commonly known as: ZOFRAN Take 4 mg by mouth 2 (two) times daily as needed for nausea or vomiting.   pantoprazole 40 MG tablet Commonly known as: PROTONIX Take 1 tablet (40 mg total) by mouth daily.   potassium chloride SA 20 MEQ tablet Commonly known as: KLOR-CON M Take 2 tablets (40 mEq total) by mouth daily. What changed: when to take this   rifaximin 550 MG Tabs tablet Commonly known as: XIFAXAN Take 1 tablet (550 mg total) by mouth 2 (two) times daily.   sertraline 100 MG tablet Commonly known as: ZOLOFT Take 100 mg by mouth daily.   spironolactone 100 MG tablet Commonly known as: ALDACTONE Take 1 tablet (100  mg total) by mouth daily.   torsemide 20 MG tablet Commonly known as: DEMADEX Take 2 tablets (40 mg total) by mouth 2 (two) times daily. What changed: how much to take   traZODone 50 MG tablet Commonly known as: DESYREL Take 1 tablet (50 mg total) by mouth  at bedtime. What changed: how much to take        Major procedures and Radiology Reports - PLEASE review detailed and final reports for all details, in brief -   CT Head Wo Contrast  Result Date: 02/01/2023 CLINICAL DATA:  Altered mental status, fall EXAM: CT HEAD WITHOUT CONTRAST TECHNIQUE: Contiguous axial images were obtained from the base of the skull through the vertex without intravenous contrast. RADIATION DOSE REDUCTION: This exam was performed according to the departmental dose-optimization program which includes automated exposure control, adjustment of the mA and/or kV according to patient size and/or use of iterative reconstruction technique. COMPARISON:  10/21/2022 FINDINGS: Brain: No evidence of acute infarction, hemorrhage, mass, mass effect, or midline shift. No hydrocephalus or extra-axial fluid collection. Basal ganglia calcifications. Normal cerebral volume. Normal pituitary and craniocervical junction. Vascular: No hyperdense vessel. Skull: Negative for fracture or focal lesion. Sinuses/Orbits: Small mucous retention cysts in the maxillary sinuses. Mild mucosal thickening in the ethmoid air cells. No acute finding in the orbits. Other: The mastoid air cells are well aerated. IMPRESSION: No acute intracranial process. Electronically Signed   By: Wiliam Ke M.D.   On: 02/01/2023 18:44   DG Chest Portable 1 View  Result Date: 02/01/2023 CLINICAL DATA:  Altered mental status and fall EXAM: PORTABLE CHEST 1 VIEW COMPARISON:  Chest radiograph dated 01/13/2023 FINDINGS: Normal lung volumes. Left basilar patchy opacity. No pleural effusion or pneumothorax. The heart size and mediastinal contours are within normal limits. No  radiographic finding of acute displaced fracture. IMPRESSION: 1. Left basilar patchy opacity, likely atelectasis. 2.  No radiographic finding of acute displaced fracture. Electronically Signed   By: Agustin Cree M.D.   On: 02/01/2023 17:07   CARDIAC CATHETERIZATION  Result Date: 01/29/2023 1. Filling pressures are reasonably optimized. 2. Normal PA pressure. 3. Low PAPi suggesting primarily RV failure. 4. High cardiac output.  Shunt run was not done but suspect this Gosdin be due to known diagnosis of cirrhosis.   ECHOCARDIOGRAM COMPLETE  Result Date: 01/23/2023    ECHOCARDIOGRAM REPORT   Patient Name:   Sloan Bleich    Date of Exam: 01/23/2023 Medical Rec #:  [id number]  Height:       74.0 in Accession #:    [id number] Weight:       384.3 lb Date of Birth:  04-27-1955 BSA:          2.869 m Patient Age:    66 years   BP:           119/57 mmHg Patient Gender: M          HR:           73 bpm. Exam Location:  Inpatient Procedure: 2D Echo, Cardiac Doppler, Color Doppler and Intracardiac            Opacification Agent Indications:    CHF  History:        Patient has prior history of Echocardiogram examinations, most                 recent 07/07/2022. CHF, Signs/Symptoms:Dyspnea and Edema; Risk                 Factors:Hypertension and Diabetes. NASH, CKD, anasarca.  Sonographer:    Milda Smart Referring Phys: Benay Spice [facility]  Sonographer Comments: Patient is obese. Image acquisition challenging due to patient body habitus and Image acquisition challenging due to respiratory motion. IMPRESSIONS  1. Left ventricular ejection fraction, by estimation, is 55  to 60%. The left ventricle has normal function. The left ventricle has no regional wall motion abnormalities. There is mild concentric left ventricular hypertrophy. Left ventricular diastolic parameters were normal.  2. Right ventricular systolic function is normal. The right ventricular size is normal. Tricuspid regurgitation signal is inadequate for assessing PA  pressure.  3. The mitral valve is grossly normal. No evidence of mitral valve regurgitation. No evidence of mitral stenosis.  4. The aortic valve is tricuspid. Aortic valve regurgitation is not visualized. No aortic stenosis is present.  5. Aortic dilatation noted. There is mild dilatation of the aortic root, measuring 43 mm. There is mild dilatation of the ascending aorta, measuring 42 mm. Comparison(s): No significant change from prior study. FINDINGS  Left Ventricle: Left ventricular ejection fraction, by estimation, is 55 to 60%. The left ventricle has normal function. The left ventricle has no regional wall motion abnormalities. Definity contrast agent was given IV to delineate the left ventricular  endocardial borders. The left ventricular internal cavity size was normal in size. There is mild concentric left ventricular hypertrophy. Left ventricular diastolic parameters were normal. Right Ventricle: The right ventricular size is normal. No increase in right ventricular wall thickness. Right ventricular systolic function is normal. Tricuspid regurgitation signal is inadequate for assessing PA pressure. Left Atrium: Left atrial size was normal in size. Right Atrium: Right atrial size was normal in size. Pericardium: There is no evidence of pericardial effusion. Mitral Valve: The mitral valve is grossly normal. No evidence of mitral valve regurgitation. No evidence of mitral valve stenosis. Tricuspid Valve: The tricuspid valve is grossly normal. Tricuspid valve regurgitation is trivial. No evidence of tricuspid stenosis. Aortic Valve: The aortic valve is tricuspid. Aortic valve regurgitation is not visualized. No aortic stenosis is present. Pulmonic Valve: The pulmonic valve was grossly normal. Pulmonic valve regurgitation is not visualized. No evidence of pulmonic stenosis. Aorta: Aortic dilatation noted. There is mild dilatation of the aortic root, measuring 43 mm. There is mild dilatation of the ascending  aorta, measuring 42 mm. Venous: The inferior vena cava was not well visualized. IAS/Shunts: The atrial septum is grossly normal.  LEFT VENTRICLE PLAX 2D LVIDd:         5.30 cm   Diastology LVIDs:         4.30 cm   LV e' medial:    12.00 cm/s LV PW:         1.20 cm   LV E/e' medial:  7.8 LV IVS:        1.20 cm   LV e' lateral:   18.10 cm/s LVOT diam:     2.40 cm   LV E/e' lateral: 5.2 LV SV:         131 LV SV Index:   46 LVOT Area:     4.52 cm  RIGHT VENTRICLE RV S prime:     18.30 cm/s TAPSE (M-mode): 3.8 cm LEFT ATRIUM             Index        RIGHT ATRIUM           Index LA diam:        4.60 cm 1.60 cm/m   RA Area:     25.35 cm LA Vol (A2C):   62.7 ml 21.86 ml/m  RA Volume:   83.45 ml  29.09 ml/m LA Vol (A4C):   53.2 ml 18.55 ml/m LA Biplane Vol: 58.7 ml 20.46 ml/m  AORTIC VALVE LVOT Vmax:   132.00  cm/s LVOT Vmean:  102.000 cm/s LVOT VTI:    0.290 m  AORTA Ao Root diam: 4.30 cm Ao Asc diam:  4.20 cm MITRAL VALVE MV Area (PHT): 3.03 cm    SHUNTS MV Decel Time: 250 msec    Systemic VTI:  0.29 m MV E velocity: 93.80 cm/s  Systemic Diam: 2.40 cm MV A velocity: 78.40 cm/s MV E/A ratio:  1.20 Lennie Odor MD Electronically signed by Lennie Odor MD Signature Date/Time: 01/23/2023/5:13:32 PM    Final    DG Chest 2 View  Result Date: 01/21/2023 CLINICAL DATA:  Chest pain. EXAM: CHEST - 2 VIEW COMPARISON:  12/20/2022. FINDINGS: Bilateral lung fields are clear. Bilateral costophrenic angles are clear. Normal cardio-mediastinal silhouette. No acute osseous abnormalities. The soft tissues are within normal limits. IMPRESSION: *No active cardiopulmonary disease. Electronically Signed   By: Jules Schick M.D.   On: 01/21/2023 16:39    Micro Results   Recent Results (from the past 240 hour(s))  MRSA Next Gen by PCR, Nasal     Status: None   Collection Time: 02/01/23 10:20 PM   Specimen: Nasal Mucosa; Nasal Swab  Result Value Ref Range Status   MRSA by PCR Next Gen NOT DETECTED NOT DETECTED Final     Comment: (NOTE) The GeneXpert MRSA Assay (FDA approved for NASAL specimens only), is one component of a comprehensive MRSA colonization surveillance program. It is not intended to diagnose MRSA infection nor to guide or monitor treatment for MRSA infections. Test performance is not FDA approved in patients less than 53 years old. Performed at [facility], [address] [place], [place] [address]    Today   Subjective    Jayven Fehl today has no new complaints -Had a couple of BMs in the last 12 hours -Eating and drinking well -Sitting up in the chair          Patient has been seen and examined prior to discharge   Objective   Blood pressure 127/60, pulse 81, temperature 98 F (36.7 C), temperature source Oral, resp. rate 15, height 6\' 2"  (1.88 m), weight (!) 150.8 kg, SpO2 100%.   Intake/Output Summary (Last 24 hours) at 02/08/2023 1203 Last data filed at 02/08/2023 1100 Gross per 24 hour  Intake 960 ml  Output 1002 ml  Net -42 ml   Exam Gen:- Awake Alert, no acute distress , morbidly obese HEENT:- [place].AT, No sclera icterus Neck-Supple Neck,No JVD,.  Lungs-  CTAB , good air movement bilaterally CV- S1, S2 normal, regular Abd-  +ve B.Sounds, Abd Soft, No tenderness, increased truncal adiposity Extremity/Skin:- No  edema,   good pulses Psych-affect is appropriate, oriented x3 Neuro-generalized weakness, no new focal deficits, no tremors    Data Review   CBC w Diff:  Lab Results  Component Value Date   WBC 4.0 02/07/2023   HGB 9.3 (L) 02/07/2023   HGB 12.3 (L) 12/27/2021   HCT 28.9 (L) 02/07/2023   HCT 36.5 (L) 12/27/2021   PLT 64 (L) 02/07/2023   PLT 63 (LL) 12/27/2021   LYMPHOPCT 15 02/02/2023   MONOPCT 13 02/02/2023   EOSPCT 4 02/02/2023   BASOPCT 1 02/02/2023   CMP:  Lab Results  Component Value Date   NA 131 (L) 02/07/2023   NA 143 12/27/2021   K 3.5 02/07/2023   CL 94 (L) 02/07/2023   CO2 24 02/07/2023   BUN 37 (H) 02/07/2023   BUN 14 12/27/2021    CREATININE 2.55 (H) 02/07/2023  CREATININE 1.40 (H) 05/17/2022   PROT 6.4 (L) 02/07/2023   ALBUMIN 2.9 (L) 02/07/2023   BILITOT 6.4 (H) 02/07/2023   ALKPHOS 75 02/07/2023   AST 90 (H) 02/07/2023   ALT 38 02/07/2023   Total Discharge time is about 33 minutes  Shon Hale M.D on 02/08/2023 at 12:03 PM  Go to [email or web address] -  for contact info  [facility] - Office  [phone]

## 2023-02-08 NOTE — Care Management Important Message (Signed)
Important Message  Patient Details  Name: Daniel Crawford MRN: [id number] Date of Birth: [birth year]   Important Message Given:  Yes - Medicare IM     [name] 02/08/2023, 12:02 PM

## 2023-02-08 NOTE — Plan of Care (Signed)
  Problem: Activity: Goal: Risk for activity intolerance will decrease 02/08/2023 0650 by Sundra Aland, RN Outcome: Progressing 02/08/2023 0649 by Sundra Aland, RN Outcome: Progressing   Problem: Health Behavior/Discharge Planning: Goal: Ability to manage health-related needs will improve 02/08/2023 0650 by Sundra Aland, RN Outcome: Progressing 02/08/2023 0649 by Sundra Aland, RN Outcome: Progressing   Problem: Safety: Goal: Ability to remain free from injury will improve Outcome: Progressing   Problem: Coping: Goal: Ability to adjust to condition or change in health will improve 02/08/2023 0650 by Sundra Aland, RN Outcome: Progressing 02/08/2023 0649 by Sundra Aland, RN Outcome: Progressing   Problem: Tissue Perfusion: Goal: Adequacy of tissue perfusion will improve Outcome: Progressing

## 2023-02-08 NOTE — Telephone Encounter (Signed)
Pharmacy Patient Advocate Encounter   Received notification from Fax that prior authorization for Xifaxan 550MG  tablets is required/requested.   Insurance verification completed.   The patient is insured through [facility] .   Per test claim: PA required; PA submitted to above mentioned insurance via CoverMyMeds Key/confirmation #/EOC [id number] Status is pending

## 2023-02-08 NOTE — Progress Notes (Signed)
Physical Therapy Treatment Patient Details Name: Daniel Crawford MRN: [id number] DOB: Jun 11, 1955 Today's Date: 02/08/2023   History of Present Illness Daniel Crawford is a 67 y.o. male with medical history significant of cirrhosis, CHF, CKD, depression, diabetes mellitus type 2, hypertension, hypothyroidism, and multiple admissions for hepatic encephalopathy.  Patient is not able to provide any history at the time of my exam.  He was quite agitated in the ED and received several sedating medications.  In the ICU he was still agitated and received a dose of Geodon.  I am told that before these doses he still was not able to provide any history.    PT Comments  Pt supine in bed and willing to participate with therapy today.  Pt A&O x3 and appropriate behavior through session.  Pt required multiple cueing for hand placement to assist with sit to stand, tendency to pull on RW causing posterior lean vs pushing from bed.  Increased distance with gait training today, slow labored movements with no LOB episodes.  EOS pt left in chair with call bell within reach and chair alarm set.      If plan is discharge home, recommend the following:     Can travel by private vehicle        Equipment Recommendations       Recommendations for Other Services       Precautions / Restrictions Precautions Precautions: Fall Restrictions Weight Bearing Restrictions: No     Mobility  Bed Mobility Overal bed mobility: Modified Independent Bed Mobility: Supine to Sit           General bed mobility comments: Increased time    Transfers Overall transfer level: Modified independent Equipment used: Rolling walker (2 wheels) Transfers: Sit to/from Stand Sit to Stand: Contact guard assist           General transfer comment: Cueing for hand placement to assist with STS    Ambulation/Gait Ambulation/Gait assistance: Contact guard assist Gait Distance (Feet): 40 Feet Assistive device: Rolling walker (2  wheels) Gait Pattern/deviations: Step-through pattern, Decreased step length - right, Decreased step length - left, Trunk flexed, Decreased stride length Gait velocity: slow velocity     General Gait Details: slow labored gait, no LOB episodes   Stairs             Wheelchair Mobility     Tilt Bed    Modified Rankin (Stroke Patients Only)       Balance                                            Cognition Arousal: Alert Behavior During Therapy: WFL for tasks assessed/performed Overall Cognitive Status: No family/caregiver present to determine baseline cognitive functioning                         Following Commands: Follows one step commands with increased time Safety/Judgement: Decreased awareness of deficits     General Comments: Pt required multiple cueing for hand placement to reduce pulling on RW vs pushing from bed        Exercises      General Comments        Pertinent Vitals/Pain Pain Assessment Pain Assessment: No/denies pain    Home Living  Prior Function            PT Goals (current goals can now be found in the care plan section)      Frequency           PT Plan      Co-evaluation              AM-PAC PT "6 Clicks" Mobility   Outcome Measure  Help needed turning from your back to your side while in a flat bed without using bedrails?: A Little Help needed moving from lying on your back to sitting on the side of a flat bed without using bedrails?: A Little Help needed moving to and from a bed to a chair (including a wheelchair)?: A Little Help needed standing up from a chair using your arms (e.g., wheelchair or bedside chair)?: A Little Help needed to walk in hospital room?: A Little Help needed climbing 3-5 steps with a railing? : Total 6 Click Score: 16    End of Session Equipment Utilized During Treatment: Gait belt Activity Tolerance: Patient  tolerated treatment well Patient left: in chair;with chair alarm set;with call bell/phone within reach Nurse Communication: Mobility status PT Visit Diagnosis: Unsteadiness on feet (R26.81);Muscle weakness (generalized) (M62.81);Other abnormalities of gait and mobility (R26.89)     Time: 1308-6578 PT Time Calculation (min) (ACUTE ONLY): 18 min  Charges:                           Becky Sax, LPTA/CLT; CBIS [phone]   Juel Burrow 02/08/2023, 9:22 AM

## 2023-02-08 NOTE — TOC Transition Note (Signed)
Transition of Care [facility] - CM/SW Discharge Note   Patient Details  Name: Daniel Crawford MRN: [id number] Date of Birth: 1956/02/25  Transition of Care [facility]) CM/SW Contact:  Leitha Bleak, RN Phone Number: 02/08/2023, 12:01 PM   Clinical Narrative:  Patient wanting to discharge home. He is active with Cindie Laroche, MD will place resumption orders. Maralyn Sago will provide services. Patient states his friend will transport him home. RN updated.     Final next level of care: Home w Home Health Services Barriers to Discharge: Barriers Resolved   Patient Goals and CMS Choice     Discharge Placement    Home with [facility] health   Patient and family notified of of transfer: 02/08/23  Discharge Plan and Services Additional resources added to the After Visit Summary for   In-house Referral: Clinical Social Work   Post Acute Care Choice: Resumption of Svcs/PTA Provider               HH Arranged: PT HH Agency: Other - See comment Cindie Laroche) Date HH Agency Contacted: 02/04/23 Time HH Agency Contacted: [phone] Representative spoke with at [facility] Agency: Maralyn Sago  Social Determinants of Health (SDOH) Interventions SDOH Screenings   Food Insecurity: No Food Insecurity (01/21/2023)  Housing: Low Risk  (01/21/2023)  Transportation Needs: No Transportation Needs (01/21/2023)  Utilities: Not At Risk (01/21/2023)  Depression (PHQ2-9): Medium Risk (10/27/2019)  Financial Resource Strain: Low Risk  (03/28/2021)   Received from [facility]  Tobacco Use: Medium Risk (02/01/2023)     Readmission Risk Interventions    02/04/2023    9:32 AM 12/21/2022    9:17 AM 10/20/2022    2:15 PM  Readmission Risk Prevention Plan  Transportation Screening Complete Complete Complete  Medication Review Oceanographer) Complete Complete Complete  PCP or Specialist appointment within 3-5 days of discharge   Not Complete  HRI or Home Care Consult Complete Complete Complete  SW Recovery  Care/Counseling Consult Complete Complete Complete  Palliative Care Screening Not Applicable Not Applicable Not Applicable  Skilled Nursing Facility Not Applicable Not Applicable Not Applicable

## 2023-02-08 NOTE — Discharge Instructions (Signed)
1)Please take Lactulose as prescribed----with a goal of having about 3 soft/mushy semisolid bowel movements per day, avoid constipation as this will lead to hepatic encephalopathy/confusional episodes  2)Repeat CBC and CMP blood tests in 3 to 5 days with primary care physician advised  3)Avoid ibuprofen/Advil/Aleve/Motrin/Goody Powders/Naproxen/BC powders/Meloxicam/Diclofenac/Indomethacin and other Nonsteroidal anti-inflammatory medications as these will make you more likely to bleed and can cause stomach ulcers, can also cause Kidney problems.

## 2023-02-08 NOTE — Telephone Encounter (Signed)
Pharmacy Patient Advocate Encounter  Received notification from [facility] that Prior Authorization for Xifaxan 550MG  tablets has been APPROVED from 02/08/2023 to 08/07/2023   PA #/Case ID/Reference #: [id number]

## 2023-02-08 NOTE — Progress Notes (Signed)
Pt has DC order. AVS was given and explained to pt, all questions were answered. Case manager was notified and HH was set-up. Pending transport by family.

## 2023-02-11 ENCOUNTER — Telehealth (HOSPITAL_COMMUNITY): Payer: Self-pay | Admitting: Licensed Clinical Social Worker

## 2023-02-11 DIAGNOSIS — D696 Thrombocytopenia, unspecified: Secondary | ICD-10-CM | POA: Diagnosis not present

## 2023-02-11 DIAGNOSIS — Z9181 History of falling: Secondary | ICD-10-CM | POA: Diagnosis not present

## 2023-02-11 DIAGNOSIS — Z7984 Long term (current) use of oral hypoglycemic drugs: Secondary | ICD-10-CM | POA: Diagnosis not present

## 2023-02-11 DIAGNOSIS — K7581 Nonalcoholic steatohepatitis (NASH): Secondary | ICD-10-CM | POA: Diagnosis not present

## 2023-02-11 DIAGNOSIS — F32A Depression, unspecified: Secondary | ICD-10-CM | POA: Diagnosis not present

## 2023-02-11 DIAGNOSIS — E039 Hypothyroidism, unspecified: Secondary | ICD-10-CM | POA: Diagnosis not present

## 2023-02-11 DIAGNOSIS — J449 Chronic obstructive pulmonary disease, unspecified: Secondary | ICD-10-CM | POA: Diagnosis not present

## 2023-02-11 DIAGNOSIS — N179 Acute kidney failure, unspecified: Secondary | ICD-10-CM | POA: Diagnosis not present

## 2023-02-11 DIAGNOSIS — E1122 Type 2 diabetes mellitus with diabetic chronic kidney disease: Secondary | ICD-10-CM | POA: Diagnosis not present

## 2023-02-11 DIAGNOSIS — Z6841 Body Mass Index (BMI) 40.0 and over, adult: Secondary | ICD-10-CM | POA: Diagnosis not present

## 2023-02-11 DIAGNOSIS — I5083 High output heart failure: Secondary | ICD-10-CM | POA: Diagnosis not present

## 2023-02-11 DIAGNOSIS — N184 Chronic kidney disease, stage 4 (severe): Secondary | ICD-10-CM | POA: Diagnosis not present

## 2023-02-11 DIAGNOSIS — D631 Anemia in chronic kidney disease: Secondary | ICD-10-CM | POA: Diagnosis not present

## 2023-02-11 DIAGNOSIS — I130 Hypertensive heart and chronic kidney disease with heart failure and stage 1 through stage 4 chronic kidney disease, or unspecified chronic kidney disease: Secondary | ICD-10-CM | POA: Diagnosis not present

## 2023-02-11 DIAGNOSIS — I5033 Acute on chronic diastolic (congestive) heart failure: Secondary | ICD-10-CM | POA: Diagnosis not present

## 2023-02-11 DIAGNOSIS — K7460 Unspecified cirrhosis of liver: Secondary | ICD-10-CM | POA: Diagnosis not present

## 2023-02-11 DIAGNOSIS — F419 Anxiety disorder, unspecified: Secondary | ICD-10-CM | POA: Diagnosis not present

## 2023-02-11 NOTE — Telephone Encounter (Signed)
CSW sent patient updates to [facility] Paramedic who is following patient in the community.  Burna Sis, LCSW Clinical Social Worker [facility] Desk#: [phone] Cell#: [phone]

## 2023-02-14 DIAGNOSIS — N179 Acute kidney failure, unspecified: Secondary | ICD-10-CM | POA: Diagnosis not present

## 2023-02-14 DIAGNOSIS — I130 Hypertensive heart and chronic kidney disease with heart failure and stage 1 through stage 4 chronic kidney disease, or unspecified chronic kidney disease: Secondary | ICD-10-CM | POA: Diagnosis not present

## 2023-02-14 DIAGNOSIS — E1122 Type 2 diabetes mellitus with diabetic chronic kidney disease: Secondary | ICD-10-CM | POA: Diagnosis not present

## 2023-02-14 DIAGNOSIS — K7460 Unspecified cirrhosis of liver: Secondary | ICD-10-CM | POA: Diagnosis not present

## 2023-02-14 DIAGNOSIS — I5033 Acute on chronic diastolic (congestive) heart failure: Secondary | ICD-10-CM | POA: Diagnosis not present

## 2023-02-14 DIAGNOSIS — F419 Anxiety disorder, unspecified: Secondary | ICD-10-CM | POA: Diagnosis not present

## 2023-02-14 DIAGNOSIS — J449 Chronic obstructive pulmonary disease, unspecified: Secondary | ICD-10-CM | POA: Diagnosis not present

## 2023-02-14 DIAGNOSIS — K7581 Nonalcoholic steatohepatitis (NASH): Secondary | ICD-10-CM | POA: Diagnosis not present

## 2023-02-14 DIAGNOSIS — E039 Hypothyroidism, unspecified: Secondary | ICD-10-CM | POA: Diagnosis not present

## 2023-02-14 DIAGNOSIS — I5083 High output heart failure: Secondary | ICD-10-CM | POA: Diagnosis not present

## 2023-02-14 DIAGNOSIS — D631 Anemia in chronic kidney disease: Secondary | ICD-10-CM | POA: Diagnosis not present

## 2023-02-14 DIAGNOSIS — Z7984 Long term (current) use of oral hypoglycemic drugs: Secondary | ICD-10-CM | POA: Diagnosis not present

## 2023-02-14 DIAGNOSIS — Z9181 History of falling: Secondary | ICD-10-CM | POA: Diagnosis not present

## 2023-02-14 DIAGNOSIS — F32A Depression, unspecified: Secondary | ICD-10-CM | POA: Diagnosis not present

## 2023-02-14 DIAGNOSIS — Z6841 Body Mass Index (BMI) 40.0 and over, adult: Secondary | ICD-10-CM | POA: Diagnosis not present

## 2023-02-14 DIAGNOSIS — N184 Chronic kidney disease, stage 4 (severe): Secondary | ICD-10-CM | POA: Diagnosis not present

## 2023-02-14 DIAGNOSIS — D696 Thrombocytopenia, unspecified: Secondary | ICD-10-CM | POA: Diagnosis not present

## 2023-02-14 NOTE — Telephone Encounter (Signed)
GLP1 coverage assessment request sent to the pool

## 2023-02-18 ENCOUNTER — Encounter (INDEPENDENT_AMBULATORY_CARE_PROVIDER_SITE_OTHER): Payer: Self-pay | Admitting: *Deleted

## 2023-02-18 DIAGNOSIS — Z9181 History of falling: Secondary | ICD-10-CM | POA: Diagnosis not present

## 2023-02-18 DIAGNOSIS — J449 Chronic obstructive pulmonary disease, unspecified: Secondary | ICD-10-CM | POA: Diagnosis not present

## 2023-02-18 DIAGNOSIS — I5083 High output heart failure: Secondary | ICD-10-CM | POA: Diagnosis not present

## 2023-02-18 DIAGNOSIS — Z6841 Body Mass Index (BMI) 40.0 and over, adult: Secondary | ICD-10-CM | POA: Diagnosis not present

## 2023-02-18 DIAGNOSIS — N179 Acute kidney failure, unspecified: Secondary | ICD-10-CM | POA: Diagnosis not present

## 2023-02-18 DIAGNOSIS — F419 Anxiety disorder, unspecified: Secondary | ICD-10-CM | POA: Diagnosis not present

## 2023-02-18 DIAGNOSIS — E1122 Type 2 diabetes mellitus with diabetic chronic kidney disease: Secondary | ICD-10-CM | POA: Diagnosis not present

## 2023-02-18 DIAGNOSIS — D696 Thrombocytopenia, unspecified: Secondary | ICD-10-CM | POA: Diagnosis not present

## 2023-02-18 DIAGNOSIS — K7460 Unspecified cirrhosis of liver: Secondary | ICD-10-CM | POA: Diagnosis not present

## 2023-02-18 DIAGNOSIS — F32A Depression, unspecified: Secondary | ICD-10-CM | POA: Diagnosis not present

## 2023-02-18 DIAGNOSIS — E039 Hypothyroidism, unspecified: Secondary | ICD-10-CM | POA: Diagnosis not present

## 2023-02-18 DIAGNOSIS — Z7984 Long term (current) use of oral hypoglycemic drugs: Secondary | ICD-10-CM | POA: Diagnosis not present

## 2023-02-18 DIAGNOSIS — N184 Chronic kidney disease, stage 4 (severe): Secondary | ICD-10-CM | POA: Diagnosis not present

## 2023-02-18 DIAGNOSIS — K7581 Nonalcoholic steatohepatitis (NASH): Secondary | ICD-10-CM | POA: Diagnosis not present

## 2023-02-18 DIAGNOSIS — I5033 Acute on chronic diastolic (congestive) heart failure: Secondary | ICD-10-CM | POA: Diagnosis not present

## 2023-02-18 DIAGNOSIS — I130 Hypertensive heart and chronic kidney disease with heart failure and stage 1 through stage 4 chronic kidney disease, or unspecified chronic kidney disease: Secondary | ICD-10-CM | POA: Diagnosis not present

## 2023-02-18 DIAGNOSIS — D631 Anemia in chronic kidney disease: Secondary | ICD-10-CM | POA: Diagnosis not present

## 2023-02-22 DIAGNOSIS — F32A Depression, unspecified: Secondary | ICD-10-CM | POA: Diagnosis not present

## 2023-02-22 DIAGNOSIS — J449 Chronic obstructive pulmonary disease, unspecified: Secondary | ICD-10-CM | POA: Diagnosis not present

## 2023-02-22 DIAGNOSIS — I5033 Acute on chronic diastolic (congestive) heart failure: Secondary | ICD-10-CM | POA: Diagnosis not present

## 2023-02-22 DIAGNOSIS — E039 Hypothyroidism, unspecified: Secondary | ICD-10-CM | POA: Diagnosis not present

## 2023-02-22 DIAGNOSIS — E1122 Type 2 diabetes mellitus with diabetic chronic kidney disease: Secondary | ICD-10-CM | POA: Diagnosis not present

## 2023-02-22 DIAGNOSIS — N184 Chronic kidney disease, stage 4 (severe): Secondary | ICD-10-CM | POA: Diagnosis not present

## 2023-02-22 DIAGNOSIS — Z9181 History of falling: Secondary | ICD-10-CM | POA: Diagnosis not present

## 2023-02-22 DIAGNOSIS — D631 Anemia in chronic kidney disease: Secondary | ICD-10-CM | POA: Diagnosis not present

## 2023-02-22 DIAGNOSIS — N179 Acute kidney failure, unspecified: Secondary | ICD-10-CM | POA: Diagnosis not present

## 2023-02-22 DIAGNOSIS — I5083 High output heart failure: Secondary | ICD-10-CM | POA: Diagnosis not present

## 2023-02-22 DIAGNOSIS — Z7984 Long term (current) use of oral hypoglycemic drugs: Secondary | ICD-10-CM | POA: Diagnosis not present

## 2023-02-22 DIAGNOSIS — F419 Anxiety disorder, unspecified: Secondary | ICD-10-CM | POA: Diagnosis not present

## 2023-02-22 DIAGNOSIS — D696 Thrombocytopenia, unspecified: Secondary | ICD-10-CM | POA: Diagnosis not present

## 2023-02-22 DIAGNOSIS — K7460 Unspecified cirrhosis of liver: Secondary | ICD-10-CM | POA: Diagnosis not present

## 2023-02-22 DIAGNOSIS — Z6841 Body Mass Index (BMI) 40.0 and over, adult: Secondary | ICD-10-CM | POA: Diagnosis not present

## 2023-02-22 DIAGNOSIS — K7581 Nonalcoholic steatohepatitis (NASH): Secondary | ICD-10-CM | POA: Diagnosis not present

## 2023-02-22 DIAGNOSIS — I130 Hypertensive heart and chronic kidney disease with heart failure and stage 1 through stage 4 chronic kidney disease, or unspecified chronic kidney disease: Secondary | ICD-10-CM | POA: Diagnosis not present

## 2023-02-23 ENCOUNTER — Other Ambulatory Visit (HOSPITAL_COMMUNITY): Payer: Self-pay | Admitting: Cardiology

## 2023-02-25 DIAGNOSIS — N179 Acute kidney failure, unspecified: Secondary | ICD-10-CM | POA: Diagnosis not present

## 2023-02-25 DIAGNOSIS — N1832 Chronic kidney disease, stage 3b: Secondary | ICD-10-CM | POA: Diagnosis not present

## 2023-02-25 DIAGNOSIS — I5032 Chronic diastolic (congestive) heart failure: Secondary | ICD-10-CM | POA: Diagnosis not present

## 2023-02-25 DIAGNOSIS — K7460 Unspecified cirrhosis of liver: Secondary | ICD-10-CM | POA: Diagnosis not present

## 2023-02-26 DIAGNOSIS — J449 Chronic obstructive pulmonary disease, unspecified: Secondary | ICD-10-CM | POA: Diagnosis not present

## 2023-02-26 DIAGNOSIS — N184 Chronic kidney disease, stage 4 (severe): Secondary | ICD-10-CM | POA: Diagnosis not present

## 2023-02-26 DIAGNOSIS — N179 Acute kidney failure, unspecified: Secondary | ICD-10-CM | POA: Diagnosis not present

## 2023-02-26 DIAGNOSIS — F32A Depression, unspecified: Secondary | ICD-10-CM | POA: Diagnosis not present

## 2023-02-26 DIAGNOSIS — I5083 High output heart failure: Secondary | ICD-10-CM | POA: Diagnosis not present

## 2023-02-26 DIAGNOSIS — I5033 Acute on chronic diastolic (congestive) heart failure: Secondary | ICD-10-CM | POA: Diagnosis not present

## 2023-02-26 DIAGNOSIS — E039 Hypothyroidism, unspecified: Secondary | ICD-10-CM | POA: Diagnosis not present

## 2023-02-26 DIAGNOSIS — K7581 Nonalcoholic steatohepatitis (NASH): Secondary | ICD-10-CM | POA: Diagnosis not present

## 2023-02-26 DIAGNOSIS — D631 Anemia in chronic kidney disease: Secondary | ICD-10-CM | POA: Diagnosis not present

## 2023-02-26 DIAGNOSIS — Z9181 History of falling: Secondary | ICD-10-CM | POA: Diagnosis not present

## 2023-02-26 DIAGNOSIS — K7460 Unspecified cirrhosis of liver: Secondary | ICD-10-CM | POA: Diagnosis not present

## 2023-02-26 DIAGNOSIS — E1122 Type 2 diabetes mellitus with diabetic chronic kidney disease: Secondary | ICD-10-CM | POA: Diagnosis not present

## 2023-02-26 DIAGNOSIS — D696 Thrombocytopenia, unspecified: Secondary | ICD-10-CM | POA: Diagnosis not present

## 2023-02-26 DIAGNOSIS — I130 Hypertensive heart and chronic kidney disease with heart failure and stage 1 through stage 4 chronic kidney disease, or unspecified chronic kidney disease: Secondary | ICD-10-CM | POA: Diagnosis not present

## 2023-02-26 DIAGNOSIS — Z6841 Body Mass Index (BMI) 40.0 and over, adult: Secondary | ICD-10-CM | POA: Diagnosis not present

## 2023-02-26 DIAGNOSIS — Z7984 Long term (current) use of oral hypoglycemic drugs: Secondary | ICD-10-CM | POA: Diagnosis not present

## 2023-02-26 DIAGNOSIS — F419 Anxiety disorder, unspecified: Secondary | ICD-10-CM | POA: Diagnosis not present

## 2023-02-28 ENCOUNTER — Encounter (HOSPITAL_COMMUNITY): Payer: Medicare HMO

## 2023-02-28 ENCOUNTER — Telehealth: Payer: Self-pay | Admitting: *Deleted

## 2023-02-28 DIAGNOSIS — D696 Thrombocytopenia, unspecified: Secondary | ICD-10-CM | POA: Diagnosis not present

## 2023-02-28 DIAGNOSIS — K7581 Nonalcoholic steatohepatitis (NASH): Secondary | ICD-10-CM | POA: Diagnosis not present

## 2023-02-28 DIAGNOSIS — E039 Hypothyroidism, unspecified: Secondary | ICD-10-CM | POA: Diagnosis not present

## 2023-02-28 DIAGNOSIS — I5083 High output heart failure: Secondary | ICD-10-CM | POA: Diagnosis not present

## 2023-02-28 DIAGNOSIS — I130 Hypertensive heart and chronic kidney disease with heart failure and stage 1 through stage 4 chronic kidney disease, or unspecified chronic kidney disease: Secondary | ICD-10-CM | POA: Diagnosis not present

## 2023-02-28 DIAGNOSIS — Z7984 Long term (current) use of oral hypoglycemic drugs: Secondary | ICD-10-CM | POA: Diagnosis not present

## 2023-02-28 DIAGNOSIS — K7460 Unspecified cirrhosis of liver: Secondary | ICD-10-CM | POA: Diagnosis not present

## 2023-02-28 DIAGNOSIS — F419 Anxiety disorder, unspecified: Secondary | ICD-10-CM | POA: Diagnosis not present

## 2023-02-28 DIAGNOSIS — Z9181 History of falling: Secondary | ICD-10-CM | POA: Diagnosis not present

## 2023-02-28 DIAGNOSIS — D631 Anemia in chronic kidney disease: Secondary | ICD-10-CM | POA: Diagnosis not present

## 2023-02-28 DIAGNOSIS — E1122 Type 2 diabetes mellitus with diabetic chronic kidney disease: Secondary | ICD-10-CM | POA: Diagnosis not present

## 2023-02-28 DIAGNOSIS — Z6841 Body Mass Index (BMI) 40.0 and over, adult: Secondary | ICD-10-CM | POA: Diagnosis not present

## 2023-02-28 DIAGNOSIS — N184 Chronic kidney disease, stage 4 (severe): Secondary | ICD-10-CM | POA: Diagnosis not present

## 2023-02-28 DIAGNOSIS — N179 Acute kidney failure, unspecified: Secondary | ICD-10-CM | POA: Diagnosis not present

## 2023-02-28 DIAGNOSIS — J449 Chronic obstructive pulmonary disease, unspecified: Secondary | ICD-10-CM | POA: Diagnosis not present

## 2023-02-28 DIAGNOSIS — I5033 Acute on chronic diastolic (congestive) heart failure: Secondary | ICD-10-CM | POA: Diagnosis not present

## 2023-02-28 DIAGNOSIS — F32A Depression, unspecified: Secondary | ICD-10-CM | POA: Diagnosis not present

## 2023-02-28 NOTE — Telephone Encounter (Signed)
Pt contacted and clarified what type of follow up/recall. Pt states it was for office visit and [place]. Last [place] completed 09/21/22 US Abdomen Limited RUQ. Would you like same [place] ordered? Please advise. Thank you!

## 2023-02-28 NOTE — Addendum Note (Signed)
Addended by: Marlowe Shores on: 02/28/2023 12:10 PM   Modules accepted: Orders

## 2023-02-28 NOTE — Telephone Encounter (Signed)
Pt is needing follow up appt. Thank you.

## 2023-02-28 NOTE — Progress Notes (Incomplete)
NO SHOW       ADVANCED HF CLINIC NOTE   PCP: [name], MD Cardiology: Dr. Diona Browner HF Cardiology: Dr. Shirlee Latch  Reason for Visit: Follow up for Diastolic Heart Failure  67 y.o. with history of NASH cirrhosis, chronic diastolic CHF, DM2, CKD stage 3, and COPD was referred by Dr. Diona Browner for evaluation of CHF.  Patient has cirrhosis due to NASH with splenomegaly, thrombocytopenia, and h/o hepatic encephalopathy.  Liver biopsy in 1/23 showed stage IV cirrhosis.  Patient has struggled with volume overload.  He was followed in the heart failure program at [facility] before moving to [place].  RHC in 10/23 showed mildly elevated filling pressures with pulmonary venous hypertension.  Echo in 4/24 showed EF 55%, mild LVH, normal RV systolic function with mild RV enlargement.  He has had several recent admissions with CHF/anasarca and hepatic encephalopathy.  He has never had enough ascites for paracentesis.  He was admitted twice in 7/24 for CHF and diuresed.  Cardiology was not consulted.  For reasons that remain obscure, he was taken off torsemide and put back on Lasix.   Seen for follow-up on 11/28/22. Weight up about 20 lb after Torsemide switched to lasix. He was restarted on torsemide at 60 mg BID. Also started [place]. Referred to Paramedicine. Called on 09/18 to report very brisk urine output resulting in incontinence. Torsemide cut back to 40 mg BID.  At follow up 12/20/22 was still significantly volume overloaded. Provider recommended Furoscix 80 mg Quay daily x 3 days then increase torsemide back to 60 mg bid after that in order to try to keep him out of the hospital. However, he wanted admission for diuresis but refused admission to [facility] and was adamant that he wanted to be admitted to [facility], closer to home. He drove there for admission and was started on IV Lasix but notes outline that pt left prior to being fully diuresed, despite MD recommendation to stay for further diuresis. He was discharged  still volume overloaded on Torsemide 80 mg tid + once weekly metolazone. D/c wt was 370 lb.   Admitted 10/28-11/6/24 for 40lb weight gain and fluid overload. AHF consulted. He was diuresed with Lasix gtt then transitioned back to Torsemide 80mg  daily. Echo 55-60%, RHC 11/5 which showed RA 9, PA 25/17, wedge of 10, cardiac index 5.01. During this admission patient made decision to be DNR status given his frequent admissions, poor prognosis, and decompensated cirrhosis. It was recommended by PT that he go to SNF for physical deconditioning and ability to care for himself independently, however he declined. Referred to [facility]. Discharge weight 336 lbs  Re-admitted 11/8-11/15/24 for hepatic encephalopathy d/t noncompliance with medications. He was treated with lactulose and restarted on Rifaximin. Weight 332 lbs at discharge. D/c'd on Torsemide 40 bid.  ***  ECG (personally reviewed): not performed   Labs (4/24): NH3 74, K 3.7, creatinine 1.31, BNP 47 Labs (5/24): K 4.1, creatinine 1.67 Labs (8/24): K 3.9, creatinine 1.13, hgb 8.5, plts 61 Labs (9/24): K 3.7, creatinine 1.72, TSH 3.73, Free T4 1.30(H) Labs (11/14): K 3.5, creatinine 2.55  PMH: 1. Cirrhosis: Due to NASH. Splenomegaly, chronic thrombocytopenia, history of hepatic encephalopathy.  - Liver biopsy in 1/23 with stage IV cirrhosis.  2. Thrombocytopenia: Due to splenomegaly.  3. Hypothyroidism 4. HTN 5. Type 2 diabetes 6. CKD stage 3 7. COPD: Moderate obstruction on PFTs, prior smoker.  8. HFpEF: RHC (10/23) with mean RA 15, PA 36/20 mean 28, mean PCWP 19, PVR 1.2  WU, CI 2.65.  - Echo (4/24): EF 55%, mild LVH, normal RV systolic function with mild RV enlargement.  - Echo (10/24): 55-60%, RV nl, aortic root dilation - RHC (11/24): RA 9, PA 25/17 mean 21, PCWP 10, CO/CI 13.6/5.01, PAPi 0.8  SH: Lives in [place], single, quit smoking 10 years ago, no ETOH.   Family History  Problem Relation Age of Onset   Liver  cancer Mother    Heart disease Mother    Aneurysm Sister    ROS: All systems reviewed and negative except as per HPI.   Current Outpatient Medications  Medication Sig Dispense Refill   acetaminophen (TYLENOL) 325 MG tablet Take 2 tablets (650 mg total) by mouth every 6 (six) hours as needed for mild pain (pain score 1-3), fever or headache (or Fever >/= 101). 100 tablet 0   albuterol (VENTOLIN HFA) 108 (90 Base) MCG/ACT inhaler Inhale 2 puffs into the lungs every 6 (six) hours as needed for wheezing or shortness of breath. 18 g 2   dapagliflozin propanediol (FARXIGA) 10 MG TABS tablet Take 1 tablet (10 mg total) by mouth daily before breakfast. 90 tablet 3   hydrOXYzine (VISTARIL) 50 MG capsule Take 1 capsule (50 mg total) by mouth 3 (three) times daily as needed for anxiety or nausea. 90 capsule 2   lactose free nutrition (BOOST) LIQD Take 237 mLs by mouth every evening.     lactulose (CHRONULAC) 10 GM/15ML solution Take 45 mLs (30 g total) by mouth 3 (three) times daily. 473 mL 2   levothyroxine (SYNTHROID) 100 MCG tablet Take 1 tablet (100 mcg total) by mouth daily before breakfast. 30 tablet 3   nystatin (MYCOSTATIN/NYSTOP) powder Apply topically 3 (three) times daily. 15 g 0   ondansetron (ZOFRAN) 4 MG tablet Take 4 mg by mouth 2 (two) times daily as needed for nausea or vomiting.     pantoprazole (PROTONIX) 40 MG tablet Take 1 tablet (40 mg total) by mouth daily. 90 tablet 3   potassium chloride SA (KLOR-CON M) 20 MEQ tablet Take 2 tablets (40 mEq total) by mouth daily. 60 tablet 1   rifaximin (XIFAXAN) 550 MG TABS tablet Take 1 tablet (550 mg total) by mouth 2 (two) times daily. 60 tablet 2   sertraline (ZOLOFT) 100 MG tablet Take 100 mg by mouth daily.     spironolactone (ALDACTONE) 100 MG tablet Take 1 tablet (100 mg total) by mouth daily. 90 tablet 2   torsemide (DEMADEX) 20 MG tablet TAKE 3 TABLETS (60 MG TOTAL) BY MOUTH 2 (TWO) TIMES DAILY. 540 tablet 1   traZODone (DESYREL) 50 MG  tablet Take 1 tablet (50 mg total) by mouth at bedtime. 90 tablet 3   No current facility-administered medications for this visit.   Wt Readings from Last 3 Encounters:  02/07/23 (!) 150.8 kg (332 lb 7.3 oz)  01/30/23 (!) 152.6 kg (336 lb 8 oz)  01/10/23 (!) 169.1 kg (372 lb 12.8 oz)   There were no vitals taken for this visit. PHYSICAL EXAM: General:  Well appearing but obese. No respiratory difficulty HEENT: normal Neck: supple. JVD 10 cm. Carotids 2+ bilat; no bruits. No lymphadenopathy or thyromegaly appreciated. Cor: PMI nondisplaced. Regular rate & rhythm. No rubs, gallops or murmurs. Lungs: clear Abdomen: obese, soft, nontender, nondistended. No hepatosplenomegaly. No bruits or masses. Good bowel sounds. Extremities: no cyanosis, clubbing, rash, 2+ b/l LE pretibial edema Neuro: alert & oriented x 3, cranial nerves grossly intact. moves all 4 extremities w/o difficulty.  Affect pleasant.  ECG: not performed   Assessment/Plan: 1.  Chronic diastolic CHF: RHC in 10/23 with mildly elevated filling pressures and pulmonary venous hypertension. Echo in 4/24 showed EF 55%, mild LVH, normal RV systolic function with mild RV enlargement. Echo 10/24 unchanged from prior. RHC 11/24 high output CO/CI 13.6/5, RA, 9, PCWP 10, PAPi 0.8. Patient has struggled with volume retention in setting of diastolic CHF and cirrhosis.  - NYHA III***, confounded by obesity and deconditioning.  - Volume overloaded on exam after recent hospitalization for CHF  - On Torsemide to 40 mg bid *** - Continue Farxiga 10 mg daily - Continue Spironolactone 100 mg daily - He would be a reasonable Cardiomems candidate, however pt says he was told insurance would not improve. Will confirm this w/ office staff today   - Encouraged to start wearing compression stockings and to elevate legs when able  - Now followed by [facility] paramedicine. - Check BNP today and again in 7 days  2. NASH cirrhosis: Stage IV by liver biopsy  in 1/23. Suspect due to NASH. He has h/o hepatic encephalopathy as well as splenomegaly with thrombocytopenia. - Follows with GI in [place].  - On Ammonia and Rifaximin. - Recent LFTs stable. 3. Thrombocytopenia: Due to cirrhosis/splenomegaly. Stable/chronic, 60K range. Other than bruising, denies any other spontaneous bleeding   4. COPD: No longer smokes.  Moderate obstruction on PFTs.  5. OSA: Mild on sleep study in 08/24 - Does not want to use CPAP 6. Abnormal TFTs: recent TSH 11.9 (11/24) - Need repeat TSH, T4, T3 *** 7. Obesity: he expressed desire to go on a GLP1i  - refer to pharmD clinic   F/u w/ APP in 3-4 wks   [place] Kj Imbert, PA-C 02/28/2023

## 2023-02-28 NOTE — Telephone Encounter (Signed)
VM was transferred from [facility] that patient was calling because he received his recall letter in the mail. He is also overdue for follow up. Patient letter sent from [employer] to [employer].

## 2023-02-28 NOTE — Telephone Encounter (Signed)
Ultrasound scheduled for 03/06/23 at [facility]. Nothing to eat or drink 6 hours prior. Pt contacted and made aware. Pt verbalized understanding.

## 2023-02-28 NOTE — Telephone Encounter (Signed)
Called patient - went directly to voice mail - mailbox full

## 2023-03-01 ENCOUNTER — Ambulatory Visit (INDEPENDENT_AMBULATORY_CARE_PROVIDER_SITE_OTHER): Payer: Medicare HMO | Admitting: Podiatry

## 2023-03-01 ENCOUNTER — Encounter: Payer: Self-pay | Admitting: Podiatry

## 2023-03-01 VITALS — Ht 74.0 in | Wt 332.0 lb

## 2023-03-01 DIAGNOSIS — M79675 Pain in left toe(s): Secondary | ICD-10-CM | POA: Diagnosis not present

## 2023-03-01 DIAGNOSIS — M79674 Pain in right toe(s): Secondary | ICD-10-CM

## 2023-03-01 DIAGNOSIS — B351 Tinea unguium: Secondary | ICD-10-CM

## 2023-03-01 NOTE — Progress Notes (Signed)
  Subjective:  Patient ID: Daniel Crawford, male    DOB: November 02, 1955,  MRN: [id number]  Chief Complaint  Patient presents with   Nail Problem    New patient here for RFC/ nail trim, possible nail fungus, and right hallux ingrown   67 y.o. male returns for the above complaint.  Patient presents with thickened and onychodystrophy mycotic toenails x 10 mild pain on palpation worse with ambulation is with pressure patient not regularly self he would like for me to do it denies any other acute complaints.  Objective:  There were no vitals filed for this visit. Podiatric Exam: Vascular: dorsalis pedis and posterior tibial pulses are palpable bilateral. Capillary return is immediate. Temperature gradient is WNL. Skin turgor WNL  Sensorium: Normal Semmes Weinstein monofilament test. Normal tactile sensation bilaterally. Nail Exam: Pt has thick disfigured discolored nails with subungual debris noted bilateral entire nail hallux through fifth toenails.  Pain on palpation to the nails. Ulcer Exam: There is no evidence of ulcer or pre-ulcerative changes or infection. Orthopedic Exam: Muscle tone and strength are WNL. No limitations in general ROM. No crepitus or effusions noted.  Skin: No Porokeratosis. No infection or ulcers    Assessment & Plan:  No diagnosis found.  Patient was evaluated and treated and all questions answered.  Onychomycosis with pain  -Nails palliatively debrided as below. -Educated on self-care  Procedure: Nail Debridement Rationale: pain  Type of Debridement: manual, sharp debridement. Instrumentation: Nail nipper, rotary burr. Number of Nails: 10  Procedures and Treatment: Consent by patient was obtained for treatment procedures. The patient understood the discussion of treatment and procedures well. All questions were answered thoroughly reviewed. Debridement of mycotic and hypertrophic toenails, 1 through 5 bilateral and clearing of subungual debris. No ulceration, no  infection noted.  Return Visit-Office Procedure: Patient instructed to return to the office for a follow up visit 3 months for continued evaluation and treatment.  Nicholes Rough, DPM    No follow-ups on file.

## 2023-03-05 ENCOUNTER — Ambulatory Visit (HOSPITAL_COMMUNITY)
Admission: RE | Admit: 2023-03-05 | Discharge: 2023-03-05 | Disposition: A | Discharge status: Home / Self Care | Payer: Medicare HMO | Source: Ambulatory Visit | Attending: Gastroenterology | Admitting: Gastroenterology

## 2023-03-05 DIAGNOSIS — N179 Acute kidney failure, unspecified: Secondary | ICD-10-CM | POA: Diagnosis not present

## 2023-03-05 DIAGNOSIS — I130 Hypertensive heart and chronic kidney disease with heart failure and stage 1 through stage 4 chronic kidney disease, or unspecified chronic kidney disease: Secondary | ICD-10-CM | POA: Diagnosis not present

## 2023-03-05 DIAGNOSIS — K7460 Unspecified cirrhosis of liver: Secondary | ICD-10-CM | POA: Diagnosis not present

## 2023-03-05 DIAGNOSIS — Z9049 Acquired absence of other specified parts of digestive tract: Secondary | ICD-10-CM | POA: Diagnosis not present

## 2023-03-05 DIAGNOSIS — N184 Chronic kidney disease, stage 4 (severe): Secondary | ICD-10-CM | POA: Diagnosis not present

## 2023-03-05 DIAGNOSIS — K7581 Nonalcoholic steatohepatitis (NASH): Secondary | ICD-10-CM | POA: Diagnosis not present

## 2023-03-05 DIAGNOSIS — J449 Chronic obstructive pulmonary disease, unspecified: Secondary | ICD-10-CM | POA: Diagnosis not present

## 2023-03-05 DIAGNOSIS — K7689 Other specified diseases of liver: Secondary | ICD-10-CM | POA: Diagnosis not present

## 2023-03-05 DIAGNOSIS — E039 Hypothyroidism, unspecified: Secondary | ICD-10-CM | POA: Diagnosis not present

## 2023-03-05 DIAGNOSIS — Z6841 Body Mass Index (BMI) 40.0 and over, adult: Secondary | ICD-10-CM | POA: Diagnosis not present

## 2023-03-05 DIAGNOSIS — F419 Anxiety disorder, unspecified: Secondary | ICD-10-CM | POA: Diagnosis not present

## 2023-03-05 DIAGNOSIS — F32A Depression, unspecified: Secondary | ICD-10-CM | POA: Diagnosis not present

## 2023-03-05 DIAGNOSIS — E1122 Type 2 diabetes mellitus with diabetic chronic kidney disease: Secondary | ICD-10-CM | POA: Diagnosis not present

## 2023-03-05 DIAGNOSIS — D631 Anemia in chronic kidney disease: Secondary | ICD-10-CM | POA: Diagnosis not present

## 2023-03-05 DIAGNOSIS — D696 Thrombocytopenia, unspecified: Secondary | ICD-10-CM | POA: Diagnosis not present

## 2023-03-05 DIAGNOSIS — I5083 High output heart failure: Secondary | ICD-10-CM | POA: Diagnosis not present

## 2023-03-05 DIAGNOSIS — I5033 Acute on chronic diastolic (congestive) heart failure: Secondary | ICD-10-CM | POA: Diagnosis not present

## 2023-03-05 DIAGNOSIS — Z9181 History of falling: Secondary | ICD-10-CM | POA: Diagnosis not present

## 2023-03-05 DIAGNOSIS — Z7984 Long term (current) use of oral hypoglycemic drugs: Secondary | ICD-10-CM | POA: Diagnosis not present

## 2023-03-06 ENCOUNTER — Ambulatory Visit (HOSPITAL_COMMUNITY): Payer: Medicare HMO

## 2023-03-06 DIAGNOSIS — R252 Cramp and spasm: Secondary | ICD-10-CM | POA: Diagnosis not present

## 2023-03-06 DIAGNOSIS — I509 Heart failure, unspecified: Secondary | ICD-10-CM | POA: Diagnosis not present

## 2023-03-06 DIAGNOSIS — Z79899 Other long term (current) drug therapy: Secondary | ICD-10-CM | POA: Diagnosis not present

## 2023-03-06 DIAGNOSIS — K7290 Hepatic failure, unspecified without coma: Secondary | ICD-10-CM | POA: Diagnosis not present

## 2023-03-06 DIAGNOSIS — R531 Weakness: Secondary | ICD-10-CM | POA: Diagnosis not present

## 2023-03-06 DIAGNOSIS — R3589 Other polyuria: Secondary | ICD-10-CM | POA: Diagnosis not present

## 2023-03-06 DIAGNOSIS — R188 Other ascites: Secondary | ICD-10-CM | POA: Diagnosis not present

## 2023-03-06 DIAGNOSIS — K7460 Unspecified cirrhosis of liver: Secondary | ICD-10-CM | POA: Diagnosis not present

## 2023-03-06 DIAGNOSIS — Z125 Encounter for screening for malignant neoplasm of prostate: Secondary | ICD-10-CM | POA: Diagnosis not present

## 2023-03-06 DIAGNOSIS — M2550 Pain in unspecified joint: Secondary | ICD-10-CM | POA: Diagnosis not present

## 2023-03-06 DIAGNOSIS — Z6841 Body Mass Index (BMI) 40.0 and over, adult: Secondary | ICD-10-CM | POA: Diagnosis not present

## 2023-03-07 DIAGNOSIS — F419 Anxiety disorder, unspecified: Secondary | ICD-10-CM | POA: Diagnosis not present

## 2023-03-07 DIAGNOSIS — N179 Acute kidney failure, unspecified: Secondary | ICD-10-CM | POA: Diagnosis not present

## 2023-03-07 DIAGNOSIS — I130 Hypertensive heart and chronic kidney disease with heart failure and stage 1 through stage 4 chronic kidney disease, or unspecified chronic kidney disease: Secondary | ICD-10-CM | POA: Diagnosis not present

## 2023-03-07 DIAGNOSIS — K7460 Unspecified cirrhosis of liver: Secondary | ICD-10-CM | POA: Diagnosis not present

## 2023-03-07 DIAGNOSIS — Z6841 Body Mass Index (BMI) 40.0 and over, adult: Secondary | ICD-10-CM | POA: Diagnosis not present

## 2023-03-07 DIAGNOSIS — Z9181 History of falling: Secondary | ICD-10-CM | POA: Diagnosis not present

## 2023-03-07 DIAGNOSIS — I5083 High output heart failure: Secondary | ICD-10-CM | POA: Diagnosis not present

## 2023-03-07 DIAGNOSIS — Z7984 Long term (current) use of oral hypoglycemic drugs: Secondary | ICD-10-CM | POA: Diagnosis not present

## 2023-03-07 DIAGNOSIS — D696 Thrombocytopenia, unspecified: Secondary | ICD-10-CM | POA: Diagnosis not present

## 2023-03-07 DIAGNOSIS — F32A Depression, unspecified: Secondary | ICD-10-CM | POA: Diagnosis not present

## 2023-03-07 DIAGNOSIS — D631 Anemia in chronic kidney disease: Secondary | ICD-10-CM | POA: Diagnosis not present

## 2023-03-07 DIAGNOSIS — E1122 Type 2 diabetes mellitus with diabetic chronic kidney disease: Secondary | ICD-10-CM | POA: Diagnosis not present

## 2023-03-07 DIAGNOSIS — I5033 Acute on chronic diastolic (congestive) heart failure: Secondary | ICD-10-CM | POA: Diagnosis not present

## 2023-03-07 DIAGNOSIS — J449 Chronic obstructive pulmonary disease, unspecified: Secondary | ICD-10-CM | POA: Diagnosis not present

## 2023-03-07 DIAGNOSIS — E039 Hypothyroidism, unspecified: Secondary | ICD-10-CM | POA: Diagnosis not present

## 2023-03-07 DIAGNOSIS — N184 Chronic kidney disease, stage 4 (severe): Secondary | ICD-10-CM | POA: Diagnosis not present

## 2023-03-07 DIAGNOSIS — K7581 Nonalcoholic steatohepatitis (NASH): Secondary | ICD-10-CM | POA: Diagnosis not present

## 2023-03-08 ENCOUNTER — Other Ambulatory Visit (HOSPITAL_COMMUNITY): Payer: Self-pay

## 2023-03-11 ENCOUNTER — Other Ambulatory Visit (HOSPITAL_COMMUNITY): Payer: Self-pay

## 2023-03-11 ENCOUNTER — Ambulatory Visit: Payer: Medicare HMO | Attending: Cardiology | Admitting: Pharmacist

## 2023-03-11 DIAGNOSIS — E66813 Obesity, class 3: Secondary | ICD-10-CM | POA: Diagnosis not present

## 2023-03-11 NOTE — Patient Instructions (Signed)
Thanks for coming in today. You are making good progress with your current physical therapy and diet regimens. Continue to make small improvements each day. Please don't hesitate to call the clinic if you have any questions.  Wilmer Floor, PharmD PGY2 Cardiology Pharmacy Resident

## 2023-03-11 NOTE — Progress Notes (Unsigned)
Patient ID: Daniel Crawford                 DOB: May 27, 1955                    MRN: [id number]     HPI: Daniel Crawford is a 67 y.o. male patient referred to pharmacy clinic by Boyce Medici PA to initiate GLP1-RA therapy. PMH is significant for cirrhosis, CHF, CKD, HTN, hypothyroidism, depression, and obesity. Most recent BMI 42.6 kg/m2. Patient was recently admitted to the hospital after visiting the ED for altered mental status and was treated for hepatic encephalopathy. After discharge he was referred for initiaiton of GLP1-RA therapy for management of T2DM and obesity. His most recent A1c was 5.6% at this admission and he is only ursing SGLT2i therapy (dapagliflozin) for DM management.  At this visit, GLP1-RA therapy was discussed with the patient, and he was told the reasons why he would not be a good candidate due to history of pancreatitis. Additionally patient was not actually diagnosed with type 2 DM with no history of A1c >6.5%.  Baseline weight and BMI: 332 lbs, 42.6 kg/m2.  Diet:  No canned foods; only fresh or frozen No processed meats; occasionally deli meats Beverages: seldom lemonade 1x/day, mostly water  Exercise:  PT 2x/week; feels like it is helping  Walks around apartment complex with assistance  Family History:  Family History  Problem Relation Age of Onset   Liver cancer Mother    Heart disease Mother    Aneurysm Sister     Social History:  Social History   Socioeconomic History   Marital status: Single    Spouse name: Not on file   Number of children: Not on file   Years of education: Not on file   Highest education level: Not on file  Occupational History   Not on file  Tobacco Use   Smoking status: Former    Current packs/day: 0.00    Average packs/day: 1 pack/day for 20.0 years (20.0 ttl pk-yrs)    Types: Cigarettes    Start date: 34    Quit date: 2017    Years since quitting: 7.9    Passive exposure: Current   Smokeless tobacco: Never  Vaping Use    Vaping status: Never Used  Substance and Sexual Activity   Alcohol use: No   Drug use: No   Sexual activity: Not on file  Other Topics Concern   Not on file  Social History Narrative   Not on file   Social Drivers of Health   Financial Resource Strain: Low Risk  (03/28/2021)   Received from [facility], [facility]   Overall Financial Resource Strain (CARDIA)    Difficulty of Paying Living Expenses: Not hard at all  Food Insecurity: No Food Insecurity (01/21/2023)   Hunger Vital Sign    Worried About Running Out of Food in the Last Year: Never true    Ran Out of Food in the Last Year: Never true  Transportation Needs: No Transportation Needs (01/21/2023)   PRAPARE - Administrator, Civil Service (Medical): No    Lack of Transportation (Non-Medical): No  Physical Activity: Not on file  Stress: Not on file  Social Connections: Not on file     Labs: Lab Results  Component Value Date   HGBA1C 5.6 02/03/2023    Wt Readings from Last 1 Encounters:  03/01/23 (!) 332 lb (150.6 kg)    BP Readings  from Last 1 Encounters:  02/08/23 127/60   Pulse Readings from Last 1 Encounters:  02/08/23 81    No results found for: "CHOL", "TRIG", "HDL", "CHOLHDL", "VLDL", "LDLCALC", "LDLDIRECT"  Past Medical History:  Diagnosis Date   Acute pancreatitis 10/02/2022   Anasarca 01/23/2022   Anemia    Anxiety    Asthma    CHF (congestive heart failure) (HCC)    CKD (chronic kidney disease)    D-dimer, elevated 01/22/2022   Decompensation of cirrhosis of liver (HCC) 05/21/2022   Depression    Diabetes mellitus without complication (HCC)    Dyspnea    Hepatic encephalopathy (HCC) 07/10/2022   Hypertension    Hypothyroidism    Liver cirrhosis secondary to NASH (HCC)    NASH (nonalcoholic steatohepatitis)    Other ascites 06/19/2019   Pancytopenia (HCC) 05/21/2022   Pre-diabetes    Spleen enlarged    Thrombocytopenia (HCC)     Current Outpatient Medications  on File Prior to Visit  Medication Sig Dispense Refill   acetaminophen (TYLENOL) 325 MG tablet Take 2 tablets (650 mg total) by mouth every 6 (six) hours as needed for mild pain (pain score 1-3), fever or headache (or Fever >/= 101). 100 tablet 0   albuterol (VENTOLIN HFA) 108 (90 Base) MCG/ACT inhaler Inhale 2 puffs into the lungs every 6 (six) hours as needed for wheezing or shortness of breath. 18 g 2   dapagliflozin propanediol (FARXIGA) 10 MG TABS tablet Take 1 tablet (10 mg total) by mouth daily before breakfast. 90 tablet 3   hydrOXYzine (VISTARIL) 50 MG capsule Take 1 capsule (50 mg total) by mouth 3 (three) times daily as needed for anxiety or nausea. 90 capsule 2   lactose free nutrition (BOOST) LIQD Take 237 mLs by mouth every evening.     lactulose (CHRONULAC) 10 GM/15ML solution Take 45 mLs (30 g total) by mouth 3 (three) times daily. 473 mL 2   levothyroxine (SYNTHROID) 100 MCG tablet Take 1 tablet (100 mcg total) by mouth daily before breakfast. 30 tablet 3   nystatin (MYCOSTATIN/NYSTOP) powder Apply topically 3 (three) times daily. 15 g 0   ondansetron (ZOFRAN) 4 MG tablet Take 4 mg by mouth 2 (two) times daily as needed for nausea or vomiting.     pantoprazole (PROTONIX) 40 MG tablet Take 1 tablet (40 mg total) by mouth daily. 90 tablet 3   potassium chloride SA (KLOR-CON M) 20 MEQ tablet Take 2 tablets (40 mEq total) by mouth daily. 60 tablet 1   rifaximin (XIFAXAN) 550 MG TABS tablet Take 1 tablet (550 mg total) by mouth 2 (two) times daily. 60 tablet 2   sertraline (ZOLOFT) 100 MG tablet Take 100 mg by mouth daily.     spironolactone (ALDACTONE) 100 MG tablet Take 1 tablet (100 mg total) by mouth daily. 90 tablet 2   torsemide (DEMADEX) 20 MG tablet TAKE 3 TABLETS (60 MG TOTAL) BY MOUTH 2 (TWO) TIMES DAILY. 540 tablet 1   traZODone (DESYREL) 50 MG tablet Take 1 tablet (50 mg total) by mouth at bedtime. 90 tablet 3   No current facility-administered medications on file prior to  visit.    No Known Allergies   Assessment/Plan:  1. Weight loss -  Patient has elevated BMI (46.8 kg/m2) and several comorbidities including cirrhosis, CHF, CKD and HTN Unfortunately patient is a poor candidate for GLP1-RA therapy due to history of pancreatitis Additionally the patient has no diagnosis of T2DM and medicare does not cover  GLP1-RA for the indication of obesity Lifestyle modifications to diet and exercise were addressed with the patient to aid in weight loss No follow-up scheduled  Wilmer Floor, PharmD PGY2 Cardiology Pharmacy Resident

## 2023-03-13 NOTE — Progress Notes (Unsigned)
I have seen patient along with Wilmer Floor and agree with plan.

## 2023-03-14 DIAGNOSIS — D631 Anemia in chronic kidney disease: Secondary | ICD-10-CM | POA: Diagnosis not present

## 2023-03-14 DIAGNOSIS — N184 Chronic kidney disease, stage 4 (severe): Secondary | ICD-10-CM | POA: Diagnosis not present

## 2023-03-14 DIAGNOSIS — K7581 Nonalcoholic steatohepatitis (NASH): Secondary | ICD-10-CM | POA: Diagnosis not present

## 2023-03-14 DIAGNOSIS — D696 Thrombocytopenia, unspecified: Secondary | ICD-10-CM | POA: Diagnosis not present

## 2023-03-14 DIAGNOSIS — Z9181 History of falling: Secondary | ICD-10-CM | POA: Diagnosis not present

## 2023-03-14 DIAGNOSIS — E039 Hypothyroidism, unspecified: Secondary | ICD-10-CM | POA: Diagnosis not present

## 2023-03-14 DIAGNOSIS — I130 Hypertensive heart and chronic kidney disease with heart failure and stage 1 through stage 4 chronic kidney disease, or unspecified chronic kidney disease: Secondary | ICD-10-CM | POA: Diagnosis not present

## 2023-03-14 DIAGNOSIS — Z6841 Body Mass Index (BMI) 40.0 and over, adult: Secondary | ICD-10-CM | POA: Diagnosis not present

## 2023-03-14 DIAGNOSIS — E1122 Type 2 diabetes mellitus with diabetic chronic kidney disease: Secondary | ICD-10-CM | POA: Diagnosis not present

## 2023-03-14 DIAGNOSIS — N179 Acute kidney failure, unspecified: Secondary | ICD-10-CM | POA: Diagnosis not present

## 2023-03-14 DIAGNOSIS — J449 Chronic obstructive pulmonary disease, unspecified: Secondary | ICD-10-CM | POA: Diagnosis not present

## 2023-03-14 DIAGNOSIS — F419 Anxiety disorder, unspecified: Secondary | ICD-10-CM | POA: Diagnosis not present

## 2023-03-14 DIAGNOSIS — K7460 Unspecified cirrhosis of liver: Secondary | ICD-10-CM | POA: Diagnosis not present

## 2023-03-14 DIAGNOSIS — Z7984 Long term (current) use of oral hypoglycemic drugs: Secondary | ICD-10-CM | POA: Diagnosis not present

## 2023-03-14 DIAGNOSIS — F32A Depression, unspecified: Secondary | ICD-10-CM | POA: Diagnosis not present

## 2023-03-14 DIAGNOSIS — I5083 High output heart failure: Secondary | ICD-10-CM | POA: Diagnosis not present

## 2023-03-14 DIAGNOSIS — I5033 Acute on chronic diastolic (congestive) heart failure: Secondary | ICD-10-CM | POA: Diagnosis not present

## 2023-03-18 DIAGNOSIS — D696 Thrombocytopenia, unspecified: Secondary | ICD-10-CM | POA: Diagnosis not present

## 2023-03-18 DIAGNOSIS — D631 Anemia in chronic kidney disease: Secondary | ICD-10-CM | POA: Diagnosis not present

## 2023-03-18 DIAGNOSIS — F32A Depression, unspecified: Secondary | ICD-10-CM | POA: Diagnosis not present

## 2023-03-18 DIAGNOSIS — I5033 Acute on chronic diastolic (congestive) heart failure: Secondary | ICD-10-CM | POA: Diagnosis not present

## 2023-03-18 DIAGNOSIS — I5083 High output heart failure: Secondary | ICD-10-CM | POA: Diagnosis not present

## 2023-03-18 DIAGNOSIS — N184 Chronic kidney disease, stage 4 (severe): Secondary | ICD-10-CM | POA: Diagnosis not present

## 2023-03-18 DIAGNOSIS — E1122 Type 2 diabetes mellitus with diabetic chronic kidney disease: Secondary | ICD-10-CM | POA: Diagnosis not present

## 2023-03-18 DIAGNOSIS — N179 Acute kidney failure, unspecified: Secondary | ICD-10-CM | POA: Diagnosis not present

## 2023-03-18 DIAGNOSIS — F419 Anxiety disorder, unspecified: Secondary | ICD-10-CM | POA: Diagnosis not present

## 2023-03-18 DIAGNOSIS — E039 Hypothyroidism, unspecified: Secondary | ICD-10-CM | POA: Diagnosis not present

## 2023-03-18 DIAGNOSIS — I130 Hypertensive heart and chronic kidney disease with heart failure and stage 1 through stage 4 chronic kidney disease, or unspecified chronic kidney disease: Secondary | ICD-10-CM | POA: Diagnosis not present

## 2023-03-18 DIAGNOSIS — Z6841 Body Mass Index (BMI) 40.0 and over, adult: Secondary | ICD-10-CM | POA: Diagnosis not present

## 2023-03-18 DIAGNOSIS — K7581 Nonalcoholic steatohepatitis (NASH): Secondary | ICD-10-CM | POA: Diagnosis not present

## 2023-03-18 DIAGNOSIS — J449 Chronic obstructive pulmonary disease, unspecified: Secondary | ICD-10-CM | POA: Diagnosis not present

## 2023-03-18 DIAGNOSIS — Z7984 Long term (current) use of oral hypoglycemic drugs: Secondary | ICD-10-CM | POA: Diagnosis not present

## 2023-03-18 DIAGNOSIS — K7460 Unspecified cirrhosis of liver: Secondary | ICD-10-CM | POA: Diagnosis not present

## 2023-03-18 DIAGNOSIS — Z9181 History of falling: Secondary | ICD-10-CM | POA: Diagnosis not present

## 2023-03-25 ENCOUNTER — Inpatient Hospital Stay (HOSPITAL_COMMUNITY)
Admission: EM | Admit: 2023-03-25 | Discharge: 2023-03-27 | DRG: 641 | Disposition: A | Discharge status: Home Health | Payer: Medicare HMO | Attending: Internal Medicine | Admitting: Internal Medicine

## 2023-03-25 ENCOUNTER — Other Ambulatory Visit: Payer: Self-pay

## 2023-03-25 ENCOUNTER — Encounter (HOSPITAL_COMMUNITY): Payer: Self-pay | Admitting: *Deleted

## 2023-03-25 ENCOUNTER — Emergency Department (HOSPITAL_COMMUNITY): Payer: Medicare HMO

## 2023-03-25 DIAGNOSIS — Z87891 Personal history of nicotine dependence: Secondary | ICD-10-CM

## 2023-03-25 DIAGNOSIS — D631 Anemia in chronic kidney disease: Secondary | ICD-10-CM | POA: Diagnosis present

## 2023-03-25 DIAGNOSIS — I959 Hypotension, unspecified: Secondary | ICD-10-CM | POA: Diagnosis not present

## 2023-03-25 DIAGNOSIS — D72819 Decreased white blood cell count, unspecified: Secondary | ICD-10-CM | POA: Diagnosis present

## 2023-03-25 DIAGNOSIS — R531 Weakness: Secondary | ICD-10-CM | POA: Diagnosis not present

## 2023-03-25 DIAGNOSIS — M25559 Pain in unspecified hip: Secondary | ICD-10-CM | POA: Insufficient documentation

## 2023-03-25 DIAGNOSIS — I130 Hypertensive heart and chronic kidney disease with heart failure and stage 1 through stage 4 chronic kidney disease, or unspecified chronic kidney disease: Secondary | ICD-10-CM | POA: Diagnosis present

## 2023-03-25 DIAGNOSIS — E66813 Obesity, class 3: Secondary | ICD-10-CM | POA: Diagnosis present

## 2023-03-25 DIAGNOSIS — K7581 Nonalcoholic steatohepatitis (NASH): Secondary | ICD-10-CM | POA: Diagnosis present

## 2023-03-25 DIAGNOSIS — E1165 Type 2 diabetes mellitus with hyperglycemia: Secondary | ICD-10-CM | POA: Diagnosis not present

## 2023-03-25 DIAGNOSIS — Z6841 Body Mass Index (BMI) 40.0 and over, adult: Secondary | ICD-10-CM

## 2023-03-25 DIAGNOSIS — J449 Chronic obstructive pulmonary disease, unspecified: Secondary | ICD-10-CM

## 2023-03-25 DIAGNOSIS — I5032 Chronic diastolic (congestive) heart failure: Secondary | ICD-10-CM | POA: Diagnosis not present

## 2023-03-25 DIAGNOSIS — Z1152 Encounter for screening for COVID-19: Secondary | ICD-10-CM | POA: Diagnosis not present

## 2023-03-25 DIAGNOSIS — E876 Hypokalemia: Secondary | ICD-10-CM

## 2023-03-25 DIAGNOSIS — N1832 Chronic kidney disease, stage 3b: Secondary | ICD-10-CM | POA: Diagnosis not present

## 2023-03-25 DIAGNOSIS — F32A Depression, unspecified: Secondary | ICD-10-CM | POA: Diagnosis not present

## 2023-03-25 DIAGNOSIS — E8809 Other disorders of plasma-protein metabolism, not elsewhere classified: Secondary | ICD-10-CM | POA: Diagnosis not present

## 2023-03-25 DIAGNOSIS — Z79899 Other long term (current) drug therapy: Secondary | ICD-10-CM

## 2023-03-25 DIAGNOSIS — M160 Bilateral primary osteoarthritis of hip: Secondary | ICD-10-CM | POA: Diagnosis present

## 2023-03-25 DIAGNOSIS — E039 Hypothyroidism, unspecified: Secondary | ICD-10-CM

## 2023-03-25 DIAGNOSIS — J4489 Other specified chronic obstructive pulmonary disease: Secondary | ICD-10-CM | POA: Diagnosis present

## 2023-03-25 DIAGNOSIS — K219 Gastro-esophageal reflux disease without esophagitis: Secondary | ICD-10-CM

## 2023-03-25 DIAGNOSIS — G4733 Obstructive sleep apnea (adult) (pediatric): Secondary | ICD-10-CM | POA: Diagnosis present

## 2023-03-25 DIAGNOSIS — E860 Dehydration: Principal | ICD-10-CM | POA: Insufficient documentation

## 2023-03-25 DIAGNOSIS — K7460 Unspecified cirrhosis of liver: Principal | ICD-10-CM | POA: Diagnosis present

## 2023-03-25 DIAGNOSIS — I5030 Unspecified diastolic (congestive) heart failure: Secondary | ICD-10-CM | POA: Insufficient documentation

## 2023-03-25 DIAGNOSIS — I890 Lymphedema, not elsewhere classified: Secondary | ICD-10-CM | POA: Diagnosis present

## 2023-03-25 DIAGNOSIS — Z79890 Hormone replacement therapy (postmenopausal): Secondary | ICD-10-CM

## 2023-03-25 DIAGNOSIS — R0989 Other specified symptoms and signs involving the circulatory and respiratory systems: Secondary | ICD-10-CM | POA: Diagnosis not present

## 2023-03-25 DIAGNOSIS — M25551 Pain in right hip: Secondary | ICD-10-CM | POA: Diagnosis not present

## 2023-03-25 DIAGNOSIS — E871 Hypo-osmolality and hyponatremia: Secondary | ICD-10-CM

## 2023-03-25 DIAGNOSIS — K5900 Constipation, unspecified: Secondary | ICD-10-CM | POA: Diagnosis present

## 2023-03-25 DIAGNOSIS — D696 Thrombocytopenia, unspecified: Secondary | ICD-10-CM | POA: Diagnosis not present

## 2023-03-25 DIAGNOSIS — Z66 Do not resuscitate: Secondary | ICD-10-CM | POA: Diagnosis not present

## 2023-03-25 DIAGNOSIS — Z8249 Family history of ischemic heart disease and other diseases of the circulatory system: Secondary | ICD-10-CM

## 2023-03-25 DIAGNOSIS — W19XXXA Unspecified fall, initial encounter: Secondary | ICD-10-CM | POA: Diagnosis not present

## 2023-03-25 DIAGNOSIS — E1122 Type 2 diabetes mellitus with diabetic chronic kidney disease: Secondary | ICD-10-CM | POA: Diagnosis not present

## 2023-03-25 DIAGNOSIS — E872 Acidosis, unspecified: Secondary | ICD-10-CM

## 2023-03-25 DIAGNOSIS — E46 Unspecified protein-calorie malnutrition: Secondary | ICD-10-CM

## 2023-03-25 DIAGNOSIS — I951 Orthostatic hypotension: Secondary | ICD-10-CM | POA: Diagnosis not present

## 2023-03-25 DIAGNOSIS — R0602 Shortness of breath: Secondary | ICD-10-CM | POA: Diagnosis not present

## 2023-03-25 DIAGNOSIS — R42 Dizziness and giddiness: Secondary | ICD-10-CM | POA: Diagnosis present

## 2023-03-25 DIAGNOSIS — Z800 Family history of malignant neoplasm of digestive organs: Secondary | ICD-10-CM

## 2023-03-25 DIAGNOSIS — M25552 Pain in left hip: Secondary | ICD-10-CM

## 2023-03-25 DIAGNOSIS — F419 Anxiety disorder, unspecified: Secondary | ICD-10-CM | POA: Diagnosis present

## 2023-03-25 DIAGNOSIS — R9431 Abnormal electrocardiogram [ECG] [EKG]: Secondary | ICD-10-CM | POA: Diagnosis present

## 2023-03-25 LAB — COMPREHENSIVE METABOLIC PANEL
ALT: 23 U/L (ref 0–44)
AST: 49 U/L — ABNORMAL HIGH (ref 15–41)
Albumin: 2.6 g/dL — ABNORMAL LOW (ref 3.5–5.0)
Alkaline Phosphatase: 77 U/L (ref 38–126)
Anion gap: 8 (ref 5–15)
BUN: 24 mg/dL — ABNORMAL HIGH (ref 8–23)
CO2: 27 mmol/L (ref 22–32)
Calcium: 8.7 mg/dL — ABNORMAL LOW (ref 8.9–10.3)
Chloride: 96 mmol/L — ABNORMAL LOW (ref 98–111)
Creatinine, Ser: 2.23 mg/dL — ABNORMAL HIGH (ref 0.61–1.24)
GFR, Estimated: 32 mL/min — ABNORMAL LOW (ref >60)
Glucose, Bld: 173 mg/dL — ABNORMAL HIGH (ref 70–99)
Potassium: 3.3 mmol/L — ABNORMAL LOW (ref 3.5–5.1)
Sodium: 131 mmol/L — ABNORMAL LOW (ref 135–145)
Total Bilirubin: 3.5 mg/dL — ABNORMAL HIGH (ref 0.0–1.2)
Total Protein: 6.1 g/dL — ABNORMAL LOW (ref 6.5–8.1)

## 2023-03-25 LAB — URINALYSIS, ROUTINE W REFLEX MICROSCOPIC
Bacteria, UA: NONE SEEN
Bilirubin Urine: NEGATIVE
Glucose, UA: 50 mg/dL — AB
Ketones, ur: NEGATIVE mg/dL
Leukocytes,Ua: NEGATIVE
Nitrite: NEGATIVE
Protein, ur: NEGATIVE mg/dL
RBC / HPF: >50 RBC/hpf (ref 0–5)
Specific Gravity, Urine: 1.011 (ref 1.005–1.030)
pH: 5 (ref 5.0–8.0)

## 2023-03-25 LAB — CBC WITH DIFFERENTIAL/PLATELET
Abs Immature Granulocytes: 0.01 10*3/uL (ref 0.00–0.07)
Basophils Absolute: 0 10*3/uL (ref 0.0–0.1)
Basophils Relative: 1 %
Eosinophils Absolute: 0.2 10*3/uL (ref 0.0–0.5)
Eosinophils Relative: 5 %
HCT: 28.3 % — ABNORMAL LOW (ref 39.0–52.0)
Hemoglobin: 9.3 g/dL — ABNORMAL LOW (ref 13.0–17.0)
Immature Granulocytes: 0 %
Lymphocytes Relative: 20 %
Lymphs Abs: 0.7 10*3/uL (ref 0.7–4.0)
MCH: 27.9 pg (ref 26.0–34.0)
MCHC: 32.9 g/dL (ref 30.0–36.0)
MCV: 85 fL (ref 80.0–100.0)
Monocytes Absolute: 0.3 10*3/uL (ref 0.1–1.0)
Monocytes Relative: 8 %
Neutro Abs: 2.2 10*3/uL (ref 1.7–7.7)
Neutrophils Relative %: 66 %
Platelets: 72 10*3/uL — ABNORMAL LOW (ref 150–400)
RBC: 3.33 MIL/uL — ABNORMAL LOW (ref 4.22–5.81)
RDW: 17.8 % — ABNORMAL HIGH (ref 11.5–15.5)
Smear Review: DECREASED
WBC: 3.3 10*3/uL — ABNORMAL LOW (ref 4.0–10.5)
nRBC: 0 % (ref 0.0–0.2)

## 2023-03-25 LAB — RESP PANEL BY RT-PCR (RSV, FLU A&B, COVID)  RVPGX2
Influenza A by PCR: NEGATIVE
Influenza B by PCR: NEGATIVE
Resp Syncytial Virus by PCR: NEGATIVE
SARS Coronavirus 2 by RT PCR: NEGATIVE

## 2023-03-25 LAB — AMMONIA: Ammonia: 60 umol/L — ABNORMAL HIGH (ref 9–35)

## 2023-03-25 LAB — TROPONIN I (HIGH SENSITIVITY)
Troponin I (High Sensitivity): 6 ng/L (ref <18)
Troponin I (High Sensitivity): 6 ng/L (ref <18)

## 2023-03-25 LAB — LACTIC ACID, PLASMA
Lactic Acid, Venous: 3.5 mmol/L (ref 0.5–1.9)
Lactic Acid, Venous: 2.6 mmol/L (ref 0.5–1.9)

## 2023-03-25 LAB — PROTIME-INR
INR: 1.6 — ABNORMAL HIGH (ref 0.8–1.2)
Prothrombin Time: 19.2 s — ABNORMAL HIGH (ref 11.4–15.2)

## 2023-03-25 LAB — TSH: TSH: 33.029 u[IU]/mL — ABNORMAL HIGH (ref 0.350–4.500)

## 2023-03-25 LAB — LIPASE, BLOOD: Lipase: 47 U/L (ref 11–51)

## 2023-03-25 MED ORDER — ALBUTEROL SULFATE (2.5 MG/3ML) 0.083% IN NEBU: 2.5000 mg | INHALATION_SOLUTION | Freq: Four times a day (QID) | RESPIRATORY_TRACT | Status: DC | PRN

## 2023-03-25 MED ORDER — SPIRONOLACTONE 100 MG PO TABS
100.0000 mg | ORAL_TABLET | Freq: Every day | ORAL | Status: DC
  Administered 2023-03-26 – 2023-03-27 (×2): 100 mg via ORAL
  Filled 2023-03-25 (×2): qty 1

## 2023-03-25 MED ORDER — POTASSIUM CHLORIDE CRYS ER 20 MEQ PO TBCR
40.0000 meq | EXTENDED_RELEASE_TABLET | Freq: Once | ORAL | Status: AC
  Administered 2023-03-25: 40 meq via ORAL
  Filled 2023-03-25: qty 2

## 2023-03-25 MED ORDER — LACTULOSE 10 GM/15ML PO SOLN
30.0000 g | Freq: Once | ORAL | Status: DC
  Filled 2023-03-25: qty 60

## 2023-03-25 MED ORDER — RIFAXIMIN 550 MG PO TABS
550.0000 mg | ORAL_TABLET | Freq: Two times a day (BID) | ORAL | Status: DC
  Administered 2023-03-25 – 2023-03-27 (×4): 550 mg via ORAL
  Filled 2023-03-25 (×4): qty 1

## 2023-03-25 MED ORDER — LACTULOSE 10 GM/15ML PO SOLN
30.0000 g | Freq: Three times a day (TID) | ORAL | Status: DC
  Administered 2023-03-26 – 2023-03-27 (×2): 30 g via ORAL
  Filled 2023-03-25 (×5): qty 60

## 2023-03-25 MED ORDER — PROCHLORPERAZINE EDISYLATE 10 MG/2ML IJ SOLN
10.0000 mg | Freq: Four times a day (QID) | INTRAMUSCULAR | Status: DC | PRN
  Administered 2023-03-26: 10 mg via INTRAVENOUS
  Filled 2023-03-25 (×2): qty 2

## 2023-03-25 MED ORDER — ALBUTEROL SULFATE HFA 108 (90 BASE) MCG/ACT IN AERS: 2.0000 | INHALATION_SPRAY | Freq: Four times a day (QID) | RESPIRATORY_TRACT | Status: DC | PRN

## 2023-03-25 MED ORDER — ORAL CARE MOUTH RINSE
15.0000 mL | OROMUCOSAL | Status: DC | PRN
Start: 2023-03-25 — End: 2023-03-27

## 2023-03-25 MED ORDER — DAPAGLIFLOZIN PROPANEDIOL 10 MG PO TABS
10.0000 mg | ORAL_TABLET | Freq: Every day | ORAL | Status: DC
  Administered 2023-03-26 – 2023-03-27 (×2): 10 mg via ORAL
  Filled 2023-03-25 (×2): qty 1

## 2023-03-25 MED ORDER — LEVOTHYROXINE SODIUM 112 MCG PO TABS
112.0000 ug | ORAL_TABLET | Freq: Every day | ORAL | Status: DC
  Administered 2023-03-26 – 2023-03-27 (×2): 112 ug via ORAL
  Filled 2023-03-25 (×2): qty 1

## 2023-03-25 MED ORDER — GLUCERNA SHAKE PO LIQD
237.0000 mL | Freq: Three times a day (TID) | ORAL | Status: DC
  Administered 2023-03-25 – 2023-03-27 (×5): 237 mL via ORAL
  Filled 2023-03-25 (×3): qty 237

## 2023-03-25 MED ORDER — TORSEMIDE 20 MG PO TABS
20.0000 mg | ORAL_TABLET | Freq: Two times a day (BID) | ORAL | Status: DC
  Administered 2023-03-26 – 2023-03-27 (×4): 20 mg via ORAL
  Filled 2023-03-25 (×4): qty 1

## 2023-03-25 MED ORDER — SODIUM CHLORIDE 0.9 % IV BOLUS
500.0000 mL | Freq: Once | INTRAVENOUS | Status: AC
  Administered 2023-03-25: 500 mL via INTRAVENOUS

## 2023-03-25 MED ORDER — SERTRALINE HCL 50 MG PO TABS
100.0000 mg | ORAL_TABLET | Freq: Every day | ORAL | Status: DC
  Administered 2023-03-26 – 2023-03-27 (×2): 100 mg via ORAL
  Filled 2023-03-25 (×2): qty 2

## 2023-03-25 MED ORDER — PANTOPRAZOLE SODIUM 40 MG PO TBEC
40.0000 mg | DELAYED_RELEASE_TABLET | Freq: Every day | ORAL | Status: DC
  Administered 2023-03-26 – 2023-03-27 (×2): 40 mg via ORAL
  Filled 2023-03-25 (×2): qty 1

## 2023-03-25 NOTE — ED Notes (Signed)
Date and time results received: 03/25/23 2:09 PM  (use smartphrase ".now" to insert current time)  Test: Lactic Acid Critical Value: 3.5  Name of Provider Notified: Asher Muir, [place]  Orders Received? Or Actions Taken?:

## 2023-03-25 NOTE — ED Notes (Signed)
Ambulated patient with SPO2 monitor.   SpO2 sat bounced between 92-93%, HR 89, and blood pressure once getting back in bed blood pressure 131/60.   PA notified.

## 2023-03-25 NOTE — ED Triage Notes (Signed)
Pt with c/o fatigue, generalized weakness and dizziness x 2 days ago.

## 2023-03-25 NOTE — ED Notes (Signed)
Pt unable to provide urine sample at this time 

## 2023-03-26 DIAGNOSIS — E1165 Type 2 diabetes mellitus with hyperglycemia: Secondary | ICD-10-CM

## 2023-03-26 DIAGNOSIS — D696 Thrombocytopenia, unspecified: Secondary | ICD-10-CM

## 2023-03-26 DIAGNOSIS — I5032 Chronic diastolic (congestive) heart failure: Secondary | ICD-10-CM | POA: Diagnosis not present

## 2023-03-26 DIAGNOSIS — K7460 Unspecified cirrhosis of liver: Secondary | ICD-10-CM

## 2023-03-26 DIAGNOSIS — E876 Hypokalemia: Secondary | ICD-10-CM | POA: Diagnosis not present

## 2023-03-26 DIAGNOSIS — E872 Acidosis, unspecified: Secondary | ICD-10-CM | POA: Diagnosis not present

## 2023-03-26 LAB — COMPREHENSIVE METABOLIC PANEL
ALT: 22 U/L (ref 0–44)
AST: 49 U/L — ABNORMAL HIGH (ref 15–41)
Albumin: 2.6 g/dL — ABNORMAL LOW (ref 3.5–5.0)
Alkaline Phosphatase: 76 U/L (ref 38–126)
Anion gap: 10 (ref 5–15)
BUN: 23 mg/dL (ref 8–23)
CO2: 25 mmol/L (ref 22–32)
Calcium: 8.6 mg/dL — ABNORMAL LOW (ref 8.9–10.3)
Chloride: 98 mmol/L (ref 98–111)
Creatinine, Ser: 2.1 mg/dL — ABNORMAL HIGH (ref 0.61–1.24)
GFR, Estimated: 34 mL/min — ABNORMAL LOW (ref >60)
Glucose, Bld: 122 mg/dL — ABNORMAL HIGH (ref 70–99)
Potassium: 3.6 mmol/L (ref 3.5–5.1)
Sodium: 133 mmol/L — ABNORMAL LOW (ref 135–145)
Total Bilirubin: 3.2 mg/dL — ABNORMAL HIGH (ref 0.0–1.2)
Total Protein: 5.9 g/dL — ABNORMAL LOW (ref 6.5–8.1)

## 2023-03-26 LAB — CBC
HCT: 27.6 % — ABNORMAL LOW (ref 39.0–52.0)
Hemoglobin: 9 g/dL — ABNORMAL LOW (ref 13.0–17.0)
MCH: 27.5 pg (ref 26.0–34.0)
MCHC: 32.6 g/dL (ref 30.0–36.0)
MCV: 84.4 fL (ref 80.0–100.0)
Platelets: 63 10*3/uL — ABNORMAL LOW (ref 150–400)
RBC: 3.27 MIL/uL — ABNORMAL LOW (ref 4.22–5.81)
RDW: 17.9 % — ABNORMAL HIGH (ref 11.5–15.5)
WBC: 3.1 10*3/uL — ABNORMAL LOW (ref 4.0–10.5)
nRBC: 0 % (ref 0.0–0.2)

## 2023-03-26 LAB — T4, FREE: Free T4: 0.89 ng/dL (ref 0.61–1.12)

## 2023-03-26 LAB — MAGNESIUM: Magnesium: 2.3 mg/dL (ref 1.7–2.4)

## 2023-03-26 LAB — PHOSPHORUS: Phosphorus: 3 mg/dL (ref 2.5–4.6)

## 2023-03-26 MED ORDER — ACETAMINOPHEN 325 MG PO TABS
650.0000 mg | ORAL_TABLET | Freq: Three times a day (TID) | ORAL | Status: DC | PRN
  Administered 2023-03-26: 650 mg via ORAL
  Filled 2023-03-26: qty 2

## 2023-03-26 MED ORDER — OXYCODONE HCL 5 MG PO TABS
5.0000 mg | ORAL_TABLET | ORAL | Status: DC | PRN
  Administered 2023-03-26 – 2023-03-27 (×2): 5 mg via ORAL
  Filled 2023-03-26 (×2): qty 1

## 2023-03-26 NOTE — Plan of Care (Signed)
  Problem: Acute Rehab PT Goals(only PT should resolve) Goal: Pt Will Go Supine/Side To Sit Outcome: Progressing Flowsheets (Taken 03/26/2023 1430) Pt will go Supine/Side to Sit:  Independently  with modified independence Goal: Patient Will Transfer Sit To/From Stand Outcome: Progressing Flowsheets (Taken 03/26/2023 1430) Patient will transfer sit to/from stand:  Independently  with modified independence Goal: Pt Will Transfer Bed To Chair/Chair To Bed Outcome: Progressing Flowsheets (Taken 03/26/2023 1430) Pt will Transfer Bed to Chair/Chair to Bed:  Independently  with modified independence Goal: Pt Will Ambulate Outcome: Progressing Flowsheets (Taken 03/26/2023 1430) Pt will Ambulate:  100 feet  with modified independence Note: Quad cane   2:30 PM, 03/26/23 Lynwood Music, MPT Physical Therapist with [facility] [phone] office [phone] mobile phone

## 2023-03-26 NOTE — Plan of Care (Signed)
  Problem: Activity: Goal: Risk for activity intolerance will decrease Outcome: Progressing   Problem: Coping: Goal: Level of anxiety will decrease Outcome: Progressing   Problem: Pain Management: Goal: General experience of comfort will improve Outcome: Progressing

## 2023-03-26 NOTE — TOC Initial Note (Signed)
 Transition of Care [facility]) - Initial/Assessment Note    Patient Details  Name: Daniel Crawford MRN: [id number] Date of Birth: 17-Dec-1955  Transition of [facility]) CM/SW Contact:    Lucie Lunger, LCSWA Phone Number: 03/26/2023, 11:04 AM  Clinical Narrative:                 Pt is high risk for readmission. Pt is well known to TOC. Pt lives alone and is independent in completing his ADLs. Pt is able to drive to appointments. Pt has a walker to use if needed. CSW spoke to Lauraine with [facility] who states they just D/C pt from [facility] services. TOC to follow.   Expected Discharge Plan: Home w Home Health Services Barriers to Discharge: Continued Medical Work up   Patient Goals and CMS Choice Patient states their goals for this hospitalization and ongoing recovery are:: return home CMS Medicare.gov Compare Post Acute Care list provided to:: Patient Choice offered to / list presented to : Patient      Expected Discharge Plan and Services In-house Referral: Clinical Social Work Discharge Planning Services: CM Consult Post Acute Care Choice: Home Health Living arrangements for the past 2 months: Single Family Home                                      Prior Living Arrangements/Services Living arrangements for the past 2 months: Single Family Home Lives with:: Self Patient language and need for interpreter reviewed:: Yes Do you feel safe going back to the place where you live?: Yes      Need for Family Participation in Patient Care: Yes (Comment) Care giver support system in place?: Yes (comment)   Criminal Activity/Legal Involvement Pertinent to Current Situation/Hospitalization: No - Comment as needed  Activities of Daily Living   ADL Screening (condition at time of admission) Independently performs ADLs?: Yes (appropriate for developmental age) Is the patient deaf or have difficulty hearing?: No Does the patient have difficulty seeing, even when wearing glasses/contacts?:  No Does the patient have difficulty concentrating, remembering, or making decisions?: No  Permission Sought/Granted                  Emotional Assessment Appearance:: Appears stated age       Alcohol / Substance Use: Not Applicable Psych Involvement: No (comment)  Admission diagnosis:  Orthostatic hypotension [I95.1] Hepatic cirrhosis, unspecified hepatic cirrhosis type, unspecified whether ascites present (HCC) [K74.60] Patient Active Problem List   Diagnosis Date Noted   Orthostatic hypotension 03/25/2023   Dehydration 03/25/2023   Lactic acidosis 03/25/2023   Hip pain 03/25/2023   Type 2 diabetes mellitus with hyperglycemia (HCC) 03/25/2023   Depression 03/25/2023   (HFpEF) heart failure with preserved ejection fraction (HCC) 03/25/2023   Obesity, Class III, BMI 40-49.9 (morbid obesity) (HCC) 03/11/2023   Hepatic encephalopathy (HCC) 02/01/2023   Acute kidney injury superimposed on stage 3b chronic kidney disease (HCC) 01/27/2023   COPD (chronic obstructive pulmonary disease) (HCC) 01/21/2023   Acute on chronic diastolic (congestive) heart failure (HCC) 01/21/2023   Cellulitis and abscess of left leg 10/23/2022   Chronic hyponatremia 10/21/2022   CKD stage 3a, GFR 45-59 ml/min (HCC) 07/06/2022   Umbilical hernia 07/05/2022   Obesity, class 3 05/25/2022   Liver cirrhosis secondary to NASH (HCC) 03/13/2022   Iron  deficiency anemia 02/28/2022   GERD (gastroesophageal reflux disease) 02/28/2022   Generalized weakness 01/23/2022  Essential hypertension 01/23/2022   Volume overload 01/23/2022   Protein calorie malnutrition (HCC) 01/22/2022   Chronic diastolic heart failure (HCC)    Hypogonadism, male 12/26/2021   History of colonic polyps 07/27/2021   Auditory hallucinations 07/27/2021   Prolonged QT interval 04/19/2021   Hypokalemia 04/18/2021   Cellulitis of left leg 03/26/2021   OSA (obstructive sleep apnea) 01/20/2020   History of ascites 10/22/2019    Acquired hypothyroidism 10/16/2016   Depression with anxiety 10/16/2016   Thrombocytopenia (HCC) 10/16/2016   Spleen enlarged 10/16/2016   Class 3 severe obesity in adult [facility]) 02/28/2016   PCP:  [place], [facility]:   [id number] - [place], [place] - [address] [facility] [address] [place] [address] Phone: [phone] Fax: [phone]  [facility] [address] [place] [address] Phone: [phone] Fax: [phone]     Social Drivers of Health (SDOH) Social History: SDOH Screenings   Food Insecurity: No Food Insecurity (03/25/2023)  Housing: Low Risk  (03/25/2023)  Transportation Needs: No Transportation Needs (03/25/2023)  Utilities: Not At Risk (03/25/2023)  Depression (PHQ2-9): Medium Risk (10/27/2019)  Financial Resource Strain: Low Risk  (03/28/2021)   Received from [facility]  Social Connections: Moderately Integrated (03/25/2023)  Tobacco Use: Medium Risk (03/25/2023)   SDOH Interventions:     Readmission Risk Interventions    03/26/2023   11:03 AM 02/04/2023    9:32 AM 12/21/2022    9:17 AM  Readmission Risk Prevention Plan  Transportation Screening Complete Complete Complete  Medication Review [employer]) Complete Complete Complete  HRI or Home Care Consult Complete Complete Complete  SW Recovery Care/Counseling Consult Complete Complete Complete  Palliative Care Screening Not Applicable Not Applicable Not Applicable  Skilled Nursing Facility Not Applicable Not Applicable Not Applicable

## 2023-03-26 NOTE — Progress Notes (Signed)
 Progress Note   Patient: Daniel Crawford FMW:[id number] DOB: May 17, 1955 DOA: 03/25/2023     1 DOS: the patient was seen and examined on 03/26/2023   Brief hospital admission course: As per H&P written by Dr. Manfred on 03/25/2023 Daniel Crawford is a 67 y.o. male with medical history significant of hypertension, cirrhosis due to NASH, CHF, CKD, depression, diabetes mellitus type 2, hypothyroidism, and multiple admissions for hepatic encephalopathy who presents to the emergency department due to 2-day onset of weakness, fatigue and lightheadedness.  He states that he feels that he was holding more fluid than normal and feels short of breath on exertion.  He complained of intermittent diarrhea and constipation with lactulose  and complaining of left lateral hip pain that has been ongoing since he sustained a fall during last time he visited the emergency room (02/01/2023).  He denies chest pain, fever, chills, cough, nausea, vomiting, abdominal pain or diarrhea.   ED Course:  In the emergency department, BP was 119/61 and other vital signs were within normal range.  Orthostatic vital signs was negative since BP on standing from sitting position after 3 minutes was higher than sitting BP.  Workup in the ED showed leukopenia, normocytic anemia and leukopenia.  WBC showed sodium 131, potassium 3.3, chloride 96, bicarb 27, blood glucose 173, BUN 24, creatinine 2.23, albumin  2.6, AST 49, ALT 23, ALP 77, total bilirubin 3.5, PT 19.2, INR 1.6, lipase 47, troponin x 2 was flat at 6, lactic acid 3.5. >  2.6.  Urinalysis was unimpressive for UTI.  Influenza A, B, SARS, respiratory, RSV was negative. Chest x-ray showed no acute findings Left hip x-ray showed no acute displaced fracture.  Symmetrical bilateral hip osteoarthritis. IV NS 1 L was provided in the ED, lactulose  was given. Hospitalist was asked to admit patient for further evaluation and management.    Assessment and Plan: 1-dizziness/lightheadedness -Appears to be  in the setting of dehydration (patient reports decreased oral intake recently and is chronically on diuretic therapy) -Fluid resuscitation Provided -Overall Feeling Better and Denying Any Lightheadedness at Time of My Evaluation. -Will Continue to Follow Vital Signs and Clinical Response -Patient Has Been Encouraged to Maintain Adequate Oral Intake.  2-acquired hypothyroidism -TSH 33 -Synthroid  dose has been adjusted -Repeat outpatient thyroid  panel in 6-8 weeks to further dictate adjustment in his supplementation  3-morbid obesity -Body mass index is 44.24 kg/m.  -Low-calorie diet and portion control discussed with patient.  4-chronic kidney disease stage IIIb -Minimize the use of nephrotoxic agent -Follow renal function trend -Advised to maintain adequate hydration.  5-lactic acidosis -In the setting of dehydration -At time of admission lactic acid 3.5 -After fluid resuscitation trending down appropriately and 2.6 at last check. -Will continue to maintain adequate hydration.  6-hypokalemia -Will replete electrolytes and follow trend.  7-hyponatremia -Patient with underlying hyponatremia due to history of cirrhosis -Mild dehydration contributing -Fluid resuscitation provided; patient advised to maintain adequate hydration -Will follow ultralights trend.  8-history of hepatic cirrhosis (NASH) -Continue the use of lactulose , rifaximin  and PPI. -Ammonia level was around 6 at time of admission -Stable mentation appreciated.  9-chronic thrombocytopenia -Secondary to cirrhosis -No overt bleeding appreciated -Continue to follow platelet count trend.  10-depression -No suicidal ideation or hallucination -Continue treatment with Zoloft .  11-chronic diastolic heart failure -Last echo in October 24 with ejection fraction 55 to 60% -Stable and compensated -Resuming Farxiga , adjusted dose of torsemide  and Aldactone . -Follow daily weights and strict I's and O's -Low-sodium  diet discussed with patient.  12-type 2 diabetes -Continue Farxiga  -A1c about 1 month ago 5.6 -Continue modified carbohydrate diet.  13-COPD -Continue as needed bronchodilator -Overall condition compensated and without acute exacerbation.  14-hypokalemia -Electrolytes have been repleted -Continue to follow trend.  Subjective:  Reports feeling better, no chest pain, no shortness of breath, no fever.  Patient reports no overt bleeding.  Physical Exam: Vitals:   03/25/23 2114 03/26/23 0224 03/26/23 0432 03/26/23 1337  BP:  (!) 99/58 113/61 (!) 111/51  Pulse:  77 82 80  Resp:  20 20 17   Temp:  98.1 F (36.7 C) 98.7 F (37.1 C) 98.4 F (36.9 C)  TempSrc:  Oral    SpO2: 96% 96% 98% 94%  Weight:      Height:       General exam: Alert, awake, oriented x 3; morbidly obese.  In no acute distress. Respiratory system: Clear to auscultation. Respiratory effort normal.  Good saturation on room air. Cardiovascular system:RRR. No rubs or gallops. Gastrointestinal system: Abdomen is obese, nondistended, soft and nontender. No organomegaly or masses felt. Normal bowel sounds heard. Central nervous system:  No focal neurological deficits. Extremities: No cyanosis or clubbing.  Chronic lymphedema associated with obesity unchanged from baseline. Skin: No petechiae. Psychiatry: Judgement and insight appear normal.  Flat affect appreciated on exam.  Data Reviewed: Comprehensive metabolic panel: Sodium 133, potassium 3.6, chloride 98, bicarb 25, BUN 23, creatinine 2.10, AST 49, ALT 22, alk phos 76 and GFR 34. CBC: White blood cells 3.1, hemoglobin 9.0 and platelet count 63K  Family Communication: No family at bedside.  Disposition: Status is: Inpatient Remains inpatient appropriate because: Continue treatment with IV therapy.   Planned Discharge Destination: Home with Home Health   Time spent: 50 minutes  Author: Eric Nunnery, MD 03/26/2023 4:39 PM  For on call review  [email or web address].

## 2023-03-26 NOTE — Evaluation (Signed)
 Occupational Therapy Evaluation Patient Details Name: Daniel Crawford MRN: [id number] DOB: 1956-01-11 Today's Date: 03/26/2023   History of Present Illness Daniel Crawford is a 67 y.o. male with medical history significant of hypertension, cirrhosis due to NASH, CHF, CKD, depression, diabetes mellitus type 2, hypothyroidism, and multiple admissions for hepatic encephalopathy who presents to the emergency department due to 2-day onset of weakness, fatigue and lightheadedness.  He states that he feels that he was holding more fluid than normal and feels short of breath on exertion.  He complained of intermittent diarrhea and constipation with lactulose  and complaining of left lateral hip pain that has been ongoing since he sustained a fall during last time he visited the emergency room (02/01/2023).  He denies chest pain, fever, chills, cough, nausea, vomiting, abdominal pain or diarrhea.   Clinical Impression   Pt agreeable to OT/PT evaluation. He reports his function is back to his baseline (mobility/ADLS)- although continues to experience fatigue and light headedness during activity. Pt completed bed mobility with modified independence with HOB slightly; raised. He donned shoes independently while seated EOB. Pt able to complete functional mobility, toilet t/f, and toilet hygiene independently with use of quad cane. He demonstrated ability to walk down and back through hallway with use of quad cane and SBA. During functional mobility in hallway, pt c/o of fatigue and light headedness. Pt does not require further OT services due to independence with ADLs.       If plan is discharge home, recommend the following: Assistance with cooking/housework    Functional Status Assessment  Patient has had a recent decline in their functional status and demonstrates the ability to make significant improvements in function in a reasonable and predictable amount of time.  Equipment Recommendations  None recommended by OT     Recommendations for Other Services       Precautions / Restrictions Precautions Precautions: Fall Restrictions Weight Bearing Restrictions Per Provider Order: No      Mobility Bed Mobility Overal bed mobility: Modified Independent             General bed mobility comments: HOB slightly raised, otherwise independent    Transfers Overall transfer level: Modified independent Equipment used: Quad cane               General transfer comment: sit to stand independent with use of quad cane      Balance Overall balance assessment: Modified Independent                                         ADL either performed or assessed with clinical judgement   ADL Overall ADL's : Independent (does not use socks as baseline due to difficulty, uses slip on shoes)                                       General ADL Comments: independently completed hand hygiene at sink, independently donns shoes, independently completes toilet hygiene     Vision Baseline Vision/History: 0 No visual deficits Ability to See in Adequate Light: 0 Adequate Patient Visual Report: No change from baseline Vision Assessment?: No apparent visual deficits            Pertinent Vitals/Pain Pain Assessment Pain Assessment: No/denies pain     Extremity/Trunk Assessment Upper Extremity Assessment Upper Extremity  Assessment: Overall WFL for tasks assessed   Lower Extremity Assessment Lower Extremity Assessment: Defer to PT evaluation   Cervical / Trunk Assessment Cervical / Trunk Assessment: Normal   Communication Communication Communication: No apparent difficulties   Cognition Arousal: Alert Behavior During Therapy: WFL for tasks assessed/performed Overall Cognitive Status: Within Functional Limits for tasks assessed                                                  Home Living Family/patient expects to be discharged to:: Private  residence Living Arrangements: Alone Available Help at Discharge: Friend(s);Available PRN/intermittently Type of Home: Apartment Home Access: Level entry     Home Layout: One level     Bathroom Shower/Tub: Producer, Television/film/video: Handicapped height Bathroom Accessibility: Yes How Accessible: Accessible via walker Home Equipment: Rolling Walker (2 wheels);Grab bars - tub/shower;Cane - quad;Shower Dipl[employer] Services Operational Officer (4 wheels)   Additional Comments: pt reports he has life alert as well      Prior Functioning/Environment Prior Level of Function : Independent/Modified Independent;History of Falls (last six months)             Mobility Comments: Leans on walls within the home; quad cane or rollator for longer distances ADLs Comments: Independent        OT Problem List: Decreased activity tolerance      OT Treatment/Interventions:      OT Goals(Current goals can be found in the care plan section) Acute Rehab OT Goals Patient Stated Goal: to go home OT Goal Formulation: With patient Time For Goal Achievement: 04/09/23 Potential to Achieve Goals: Good  OT Frequency:      Co-evaluation PT/OT/SLP Co-Evaluation/Treatment: Yes Reason for Co-Treatment: To address functional/ADL transfers   OT goals addressed during session: ADL's and self-care      AM-PAC OT 6 Clicks Daily Activity     Outcome Measure Help from another person eating meals?: None Help from another person taking care of personal grooming?: None Help from another person toileting, which includes using toliet, bedpan, or urinal?: None Help from another person bathing (including washing, rinsing, drying)?: None Help from another person to put on and taking off regular upper body clothing?: None Help from another person to put on and taking off regular lower body clothing?: None 6 Click Score: 24   End of Session    Activity Tolerance: Patient tolerated treatment well Patient left: in  bed;with call bell/phone within reach  OT Visit Diagnosis: Dizziness and giddiness (R42)                Time: 1050-1104 OT Time Calculation (min): 14 min Charges:  OT General Charges $OT Visit: 1 Visit    Chiquita LOISE Sermon, OTR/L 03/26/2023, 12:00 PM

## 2023-03-26 NOTE — Plan of Care (Signed)
   Problem: Education: Goal: Knowledge of General Education information will improve Description Including pain rating scale, medication(s)/side effects and non-pharmacologic comfort measures Outcome: Progressing   Problem: Health Behavior/Discharge Planning: Goal: Ability to manage health-related needs will improve Outcome: Progressing

## 2023-03-26 NOTE — Evaluation (Signed)
 Physical Therapy Evaluation Patient Details Name: Daniel Crawford MRN: [id number] DOB: Oct 13, 1955 Today's Date: 03/26/2023  History of Present Illness  Daniel Crawford is a 67 y.o. male with medical history significant of hypertension, cirrhosis due to NASH, CHF, CKD, depression, diabetes mellitus type 2, hypothyroidism, and multiple admissions for hepatic encephalopathy who presents to the emergency department due to 2-day onset of weakness, fatigue and lightheadedness.  He states that he feels that he was holding more fluid than normal and feels short of breath on exertion.  He complained of intermittent diarrhea and constipation with lactulose  and complaining of left lateral hip pain that has been ongoing since he sustained a fall during last time he visited the emergency room (02/01/2023).  He denies chest pain, fever, chills, cough, nausea, vomiting, abdominal pain or diarrhea.   Clinical Impression  Patient functioning near baseline other c/o fatigue during ambulation using wide based quad-cane, no loss of balance and requested to go back to bed after therapy.  Patient will benefit from continued skilled physical therapy in hospital and recommended venue below to increase strength, balance, endurance for safe ADLs and gait.         If plan is discharge home, recommend the following: A little help with walking and/or transfers;A little help with bathing/dressing/bathroom;Help with stairs or ramp for entrance;Assistance with cooking/housework   Can travel by private vehicle        Equipment Recommendations None recommended by PT  Recommendations for Other Services       Functional Status Assessment Patient has had a recent decline in their functional status and demonstrates the ability to make significant improvements in function in a reasonable and predictable amount of time.     Precautions / Restrictions Precautions Precautions: Fall Restrictions Weight Bearing Restrictions Per Provider Order:  No      Mobility  Bed Mobility Overal bed mobility: Modified Independent             General bed mobility comments: HOB slightly raised, otherwise independent    Transfers Overall transfer level: Modified independent Equipment used: Quad cane               General transfer comment: sit to stand independent with use of quad cane    Ambulation/Gait Ambulation/Gait assistance: Supervision Gait Distance (Feet): 75 Feet Assistive device: Quad cane Gait Pattern/deviations: Step-to pattern, Decreased step length - right, Decreased step length - left, Decreased stride length Gait velocity: decreased     General Gait Details: slightly labored cadence with mostly step-pattern using wide based quad-cane, no loss of blaance and limited mostly due to c/o fatigue  Stairs            Wheelchair Mobility     Tilt Bed    Modified Rankin (Stroke Patients Only)       Balance Overall balance assessment: Needs assistance Sitting-balance support: Feet supported, No upper extremity supported Sitting balance-Leahy Scale: Good Sitting balance - Comments: seated at EOB   Standing balance support: During functional activity, Single extremity supported Standing balance-Leahy Scale: Fair Standing balance comment: fair/good using Quad-cane                             Pertinent Vitals/Pain Pain Assessment Pain Assessment: No/denies pain    Home Living Family/patient expects to be discharged to:: Private residence Living Arrangements: Alone Available Help at Discharge: Friend(s);Available PRN/intermittently Type of Home: Apartment Home Access: Level entry  Home Layout: One level Home Equipment: Rolling Walker (2 wheels);Grab bars - tub/shower;Cane - quad;Shower Diplomatic Services Operational Officer (4 wheels) Additional Comments: pt reports he has life alert as well    Prior Function Prior Level of Function : Independent/Modified Independent;History of Falls  (last six months)             Mobility Comments: Leans on walls within the home; quad cane or rollator for longer distances ADLs Comments: Independent     Extremity/Trunk Assessment   Upper Extremity Assessment Upper Extremity Assessment: Defer to OT evaluation    Lower Extremity Assessment Lower Extremity Assessment: Generalized weakness    Cervical / Trunk Assessment Cervical / Trunk Assessment: Normal  Communication   Communication Communication: No apparent difficulties  Cognition Arousal: Alert Behavior During Therapy: WFL for tasks assessed/performed Overall Cognitive Status: Within Functional Limits for tasks assessed                                          General Comments      Exercises     Assessment/Plan    PT Assessment Patient needs continued PT services  PT Problem List Decreased strength;Decreased activity tolerance;Decreased balance;Decreased mobility       PT Treatment Interventions DME instruction;Gait training;Stair training;Functional mobility training;Therapeutic activities;Therapeutic exercise;Balance training;Patient/family education    PT Goals (Current goals can be found in the Care Plan section)  Acute Rehab PT Goals Patient Stated Goal: return home with friends to assist PT Goal Formulation: With patient Time For Goal Achievement: 03/29/23 Potential to Achieve Goals: Good    Frequency Min 3X/week     Co-evaluation PT/OT/SLP Co-Evaluation/Treatment: Yes Reason for Co-Treatment: To address functional/ADL transfers PT goals addressed during session: Mobility/safety with mobility;Balance;Proper use of DME OT goals addressed during session: ADL's and self-care       AM-PAC PT 6 Clicks Mobility  Outcome Measure Help needed turning from your back to your side while in a flat bed without using bedrails?: None Help needed moving from lying on your back to sitting on the side of a flat bed without using  bedrails?: None Help needed moving to and from a bed to a chair (including a wheelchair)?: A Little Help needed standing up from a chair using your arms (e.g., wheelchair or bedside chair)?: None Help needed to walk in hospital room?: A Little Help needed climbing 3-5 steps with a railing? : A Little 6 Click Score: 21    End of Session   Activity Tolerance: Patient tolerated treatment well;Patient limited by fatigue Patient left: in bed;with call bell/phone within reach Nurse Communication: Mobility status PT Visit Diagnosis: Unsteadiness on feet (R26.81);Other abnormalities of gait and mobility (R26.89);Muscle weakness (generalized) (M62.81)    Time: 8952-8898 PT Time Calculation (min) (ACUTE ONLY): 14 min   Charges:   PT Evaluation $PT Eval Low Complexity: 1 Low PT Treatments $Therapeutic Activity: 8-22 mins PT General Charges $$ ACUTE PT VISIT: 1 Visit         2:26 PM, 03/26/23 Lynwood Music, MPT Physical Therapist with [facility] [place] [address] [phone] office [phone] mobile phone

## 2023-03-27 DIAGNOSIS — J449 Chronic obstructive pulmonary disease, unspecified: Secondary | ICD-10-CM | POA: Diagnosis not present

## 2023-03-27 DIAGNOSIS — I5032 Chronic diastolic (congestive) heart failure: Secondary | ICD-10-CM | POA: Diagnosis not present

## 2023-03-27 DIAGNOSIS — K219 Gastro-esophageal reflux disease without esophagitis: Secondary | ICD-10-CM | POA: Diagnosis not present

## 2023-03-27 MED ORDER — TORSEMIDE 20 MG PO TABS: 40.0000 mg | ORAL_TABLET | Freq: Two times a day (BID) | ORAL | Status: DC

## 2023-03-27 MED ORDER — LEVOTHYROXINE SODIUM 112 MCG PO TABS: 112.0000 ug | ORAL_TABLET | Freq: Every day | ORAL | 3 refills | Status: DC

## 2023-03-27 MED ORDER — TAMSULOSIN HCL 0.4 MG PO CAPS: 0.4000 mg | ORAL_CAPSULE | Freq: Every day | ORAL | Status: AC

## 2023-03-27 NOTE — TOC Transition Note (Addendum)
 Transition of Care [facility]) - Discharge Note   Patient Details  Name: Daniel Crawford MRN: [id number] Date of Birth: Jan 12, 1956  Transition of Care [facility]) CM/SW Contact:  Mcarthur Saddie Kim, LCSW Phone Number: 03/27/2023, 3:40 PM   Clinical Narrative: D/C later today. Sarah with [facility] accepts for HHPT and aware of d/c. Pt notified and agreeable. HHPT order in.       Final next level of care: Home w Home Health Services Barriers to Discharge: Barriers Resolved   Patient Goals and CMS Choice Patient states their goals for this hospitalization and ongoing recovery are:: return home CMS Medicare.gov Compare Post Acute Care list provided to:: Patient Choice offered to / list presented to : Patient      Discharge Placement                       Discharge Plan and Services Additional resources added to the After Visit Summary for   In-house Referral: Clinical Social Work Discharge Planning Services: CM Consult Post Acute Care Choice: Home Health                    HH Arranged: PT [facility] Agency: Other - See comment Damita) Date HH Agency Contacted: 03/27/23 Time HH Agency Contacted: 1540 Representative spoke with at [facility] Agency: Lauraine  Social Drivers of Health (SDOH) Interventions SDOH Screenings   Food Insecurity: No Food Insecurity (03/25/2023)  Housing: Low Risk  (03/25/2023)  Transportation Needs: No Transportation Needs (03/25/2023)  Utilities: Not At Risk (03/25/2023)  Depression (PHQ2-9): Medium Risk (10/27/2019)  Financial Resource Strain: Low Risk  (03/28/2021)   Received from [facility]  Social Connections: Moderately Integrated (03/25/2023)  Tobacco Use: Medium Risk (03/25/2023)     Readmission Risk Interventions    03/26/2023   11:03 AM 02/04/2023    9:32 AM 12/21/2022    9:17 AM  Readmission Risk Prevention Plan  Transportation Screening Complete Complete Complete  Medication Review Oceanographer) Complete Complete Complete   HRI or Home Care Consult Complete Complete Complete  SW Recovery Care/Counseling Consult Complete Complete Complete  Palliative Care Screening Not Applicable Not Applicable Not Applicable  Skilled Nursing Facility Not Applicable Not Applicable Not Applicable

## 2023-03-27 NOTE — Plan of Care (Signed)

## 2023-03-27 NOTE — Progress Notes (Signed)
 PT Cancellation Note  Patient Details Name: Daniel Crawford MRN: [id number] DOB: 07-27-1955   Cancelled Treatment:    Reason Eval/Treat Not Completed: Patient declined, no reason specified; Patient declined further mobility with PT since he walked earlier today.   2:36 PM, 03/27/23 Prentice CANDIE Stains PT, DPT Physical Therapist at [facility]

## 2023-03-27 NOTE — Discharge Summary (Signed)
 Physician Discharge Summary   Patient: Daniel Crawford MRN: [id number] DOB: 10/12/1955  Admit date:     03/25/2023  Discharge date: 03/27/23  Discharge Physician: Eric Nunnery   PCP: [facility]   Recommendations at discharge:  Repeat basic metabolic panel to follow electrolytes and renal function Reassess blood pressure/volume status and further adjust antihypertensive regimen and diuretics as needed. Make sure patient follow-up with gastroenterology service as instructed Continue assisting patient with weight loss management. Repeat thyroid  panel in 6-8 weeks with further adjustment to Synthroid  therapy as required. Repeat CBC at follow-up visit to assess hemoglobin trend/platelet count stability.  Discharge Diagnoses: Active Problems:   Hepatic cirrhosis (HCC)   Thrombocytopenia (HCC)   Acquired hypothyroidism   GERD (gastroesophageal reflux disease)   COPD (chronic obstructive pulmonary disease) (HCC)   Hypokalemia   Prolonged QT interval   Obesity, Class III, BMI 40-49.9 (morbid obesity) (HCC)   Dehydration   Lactic acidosis   Hip pain   Type 2 diabetes mellitus with hyperglycemia (HCC)   Depression   (HFpEF) heart failure with preserved ejection fraction [facility])  Brief Hospital admission course: As per H&P written by Dr. Manfred on 03/25/2023 Daniel Crawford is a 68 y.o. male with medical history significant of hypertension, cirrhosis due to NASH, CHF, CKD, depression, diabetes mellitus type 2, hypothyroidism, and multiple admissions for hepatic encephalopathy who presents to the emergency department due to 2-day onset of weakness, fatigue and lightheadedness.  He states that he feels that he was holding more fluid than normal and feels short of breath on exertion.  He complained of intermittent diarrhea and constipation with lactulose  and complaining of left lateral hip pain that has been ongoing since he sustained a fall during last time he visited the emergency  room (02/01/2023).  He denies chest pain, fever, chills, cough, nausea, vomiting, abdominal pain or diarrhea.   ED Course:  In the emergency department, BP was 119/61 and other vital signs were within normal range.  Orthostatic vital signs was negative since BP on standing from sitting position after 3 minutes was higher than sitting BP.  Workup in the ED showed leukopenia, normocytic anemia and leukopenia.  WBC showed sodium 131, potassium 3.3, chloride 96, bicarb 27, blood glucose 173, BUN 24, creatinine 2.23, albumin  2.6, AST 49, ALT 23, ALP 77, total bilirubin 3.5, PT 19.2, INR 1.6, lipase 47, troponin x 2 was flat at 6, lactic acid 3.5. >  2.6.  Urinalysis was unimpressive for UTI.  Influenza A, B, SARS, respiratory, RSV was negative. Chest x-ray showed no acute findings Left hip x-ray showed no acute displaced fracture.  Symmetrical bilateral hip osteoarthritis. IV NS 1 L was provided in the ED, lactulose  was given. Hospitalist was asked to admit patient for further evaluation and management.  Assessment and Plan: 1-dizziness/lightheadedness -Appears to be in the setting of dehydration (patient reports decreased oral intake recently and is chronically on diuretic therapy) -Fluid resuscitation Provided -Overall Feeling Better and Denying Any Lightheadedness at Time of My Evaluation. -Will Continue to Follow Vital Signs and Clinical Response -Patient Has Been Encouraged to Maintain Adequate Oral Intake.   2-acquired hypothyroidism -TSH 33 -Synthroid  dose has been adjusted -Repeat outpatient thyroid  panel in 6-8 weeks to further dictate adjustment in his supplementation   3-morbid obesity -Body mass index is 44.24 kg/m.  -Low-calorie diet and portion control discussed with patient.   4-chronic kidney disease stage IIIb -Minimize the use of nephrotoxic agent -Follow renal function trend intermittently. -Advised to maintain  adequate hydration.   5-lactic acidosis -In the setting of  dehydration -At time of admission lactic acid 3.5 -After fluid resuscitation trending down appropriately and 2.6 at last check. -Completely resolved at time of discharge Patient advised to maintain adequate hydration.   6-hyponatremia -Patient with underlying hyponatremia due to history of cirrhosis -Mild dehydration contributing -Fluid resuscitation provided; patient advised to maintain adequate hydration -Continue to follow electrolytes trend.   7-history of hepatic cirrhosis ([facility]) -Continue the use of lactulose , rifaximin  and PPI. -Ammonia level was around 60 at time of admission -Stable mentation appreciated. -Continue patient follow-up with gastroenterology service as previously arranged.   8-chronic thrombocytopenia -Secondary to cirrhosis -No overt bleeding appreciated -Continue to follow platelet count trend intermittently.   9-depression -No suicidal ideation or hallucination -Continue treatment with Zoloft .   10-chronic diastolic heart failure -Last echo in October 24 with ejection fraction 55 to 60% -Stable and compensated -Resuming Farxiga , adjusted dose of torsemide  and Aldactone . -Follow daily weights and strict I's and O's -Patient has been encouraged to follow low-sodium diet.   11-type 2 diabetes -Continue Farxiga  -A1c about 1 month ago 5.6 -Continue modified carbohydrate diet. -Continue to follow CBGs fluctuation and further adjust management as required.   12-COPD -Continue as needed bronchodilator -Overall condition compensated and without acute exacerbation.   13-hypokalemia -Electrolytes have been repleted -Continue to follow trend with repeat basic metabolic panel at follow-up visit. -Continue daily supplementation.   Consultants: None Procedures performed: See below for x-ray reports. Disposition: Home with home health services. Diet recommendation: Heart healthy/low-sodium diet and modified carbohydrate.  DISCHARGE MEDICATION: Allergies  as of 03/27/2023   No Known Allergies      Medication List     TAKE these medications    acetaminophen  325 MG tablet Commonly known as: TYLENOL  Take 2 tablets (650 mg total) by mouth every 6 (six) hours as needed for mild pain (pain score 1-3), fever or headache (or Fever >/= 101).   albuterol  108 (90 Base) MCG/ACT inhaler Commonly known as: VENTOLIN  HFA Inhale 2 puffs into the lungs every 6 (six) hours as needed for wheezing or shortness of breath.   dapagliflozin  propanediol 10 MG Tabs tablet Commonly known as: Farxiga  Take 1 tablet (10 mg total) by mouth daily before breakfast.   diclofenac Sodium 1 % Gel Commonly known as: VOLTAREN Apply 2 g topically in the morning and at bedtime. 1-2 inches topically twice daily   hydrOXYzine  50 MG capsule Commonly known as: VISTARIL  Take 1 capsule (50 mg total) by mouth 3 (three) times daily as needed for anxiety or nausea.   lactose free nutrition Liqd Take 237 mLs by mouth every evening.   lactulose  10 GM/15ML solution Commonly known as: CHRONULAC  Take 45 mLs (30 g total) by mouth 3 (three) times daily.   levothyroxine  112 MCG tablet Commonly known as: SYNTHROID  Take 1 tablet (112 mcg total) by mouth daily before breakfast. What changed:  medication strength how much to take   ondansetron  4 MG tablet Commonly known as: ZOFRAN  Take 4 mg by mouth 2 (two) times daily as needed for nausea or vomiting.   pantoprazole  40 MG tablet Commonly known as: PROTONIX  Take 1 tablet (40 mg total) by mouth daily.   potassium chloride  SA 20 MEQ tablet Commonly known as: KLOR-CON  M Take 2 tablets (40 mEq total) by mouth daily.   rifaximin  550 MG Tabs tablet Commonly known as: XIFAXAN  Take 1 tablet (550 mg total) by mouth 2 (two) times daily.   sertraline   100 MG tablet Commonly known as: ZOLOFT  Take 100 mg by mouth daily.   spironolactone  100 MG tablet Commonly known as: ALDACTONE  Take 1 tablet (100 mg total) by mouth daily.    tamsulosin  0.4 MG Caps capsule Commonly known as: FLOMAX  Take 1 capsule (0.4 mg total) by mouth daily after supper. What changed: when to take this   torsemide  20 MG tablet Commonly known as: DEMADEX  Take 2 tablets (40 mg total) by mouth 2 (two) times daily. What changed: See the new instructions.   traZODone  50 MG tablet Commonly known as: DESYREL  Take 1 tablet (50 mg total) by mouth at bedtime.   Vitamin D3 1.25 MG (50000 UT) Caps Take 1 capsule by mouth once a week.        Follow-up Information     [facility] Follow up.   Why: Will contact you to schedule home health visits.        [facility]. Schedule an appointment as soon as possible for a visit in 10 day(s).   Contact information: [address] [place] [address] [phone]                Discharge Exam: Fredricka Weights   03/25/23 1242 03/25/23 2100  Weight: (!) 154.7 kg (!) 156.3 kg   General exam: Alert, awake, oriented x 3; morbidly obese.  In no acute distress.  Patient reports no dizziness or lightheadedness; feeling ready to go home. Respiratory system: Clear to auscultation. Respiratory effort normal.  Good saturation on room air. Cardiovascular system:RRR. No rubs or gallops. Gastrointestinal system: Abdomen is obese, nondistended, soft and nontender. No organomegaly or masses felt. Normal bowel sounds heard. Central nervous system:  No focal neurological deficits. Extremities: No cyanosis or clubbing.  Chronic lymphedema associated with obesity unchanged from baseline. Skin: No petechiae. Psychiatry: Judgement and insight appear normal.  Flat affect appreciated on exam.  Condition at discharge: Stable and improved.  The results of significant diagnostics from this hospitalization (including imaging, microbiology, ancillary and laboratory) are listed below for reference.   Imaging Studies: DG Chest Portable 1 View Result Date: 03/25/2023 CLINICAL  DATA:  Shortness of breath, fatigue and generalized weakness with dizziness. EXAM: PORTABLE CHEST 1 VIEW COMPARISON:  02/01/2023 FINDINGS: Normal heart size. Lung volumes are low. No pleural fluid, interstitial edema or airspace disease. Visualized osseous structures are unremarkable. IMPRESSION: Low lung volumes. No acute findings. Electronically Signed   By: Waddell Calk M.D.   On: 03/25/2023 15:11   DG Hip Unilat W or Wo Pelvis 2-3 Views Left Result Date: 03/25/2023 CLINICAL DATA:  Previous fall, left hip pain EXAM: DG HIP (WITH OR WITHOUT PELVIS) 2-3V LEFT COMPARISON:  None Available. FINDINGS: Frontal view of the pelvis as well as frontal and frogleg lateral views of the left hip are obtained. No acute fracture, subluxation, or dislocation. Symmetrical bilateral hip osteoarthritis. Prominent lower lumbar spondylosis. The sacroiliac joints are normal. Prior left inguinal hernia repair. IMPRESSION: 1. No acute displaced fracture. 2. Symmetrical bilateral hip osteoarthritis. Electronically Signed   By: Ozell Daring M.D.   On: 03/25/2023 15:10   US  Abdomen Limited RUQ (LIVER/GB) Result Date: 03/05/2023 CLINICAL DATA:  Cirrhosis EXAM: ULTRASOUND ABDOMEN LIMITED RIGHT UPPER QUADRANT COMPARISON:  CT abdomen pelvis 10/20/2022 FINDINGS: Gallbladder: Surgically absent Common bile duct: Diameter: 4.1 mL Liver: Coarsened echogenicity and nodular contour. Limited exam. No focal lesion. Portal vein is patent on color Doppler imaging with normal direction of blood flow towards the liver. Other:  None. IMPRESSION: Cirrhotic morphology of the liver. No focal lesion. Electronically Signed   By: Bard Moats M.D.   On: 03/05/2023 10:45    Microbiology: Results for orders placed or performed during the hospital encounter of 03/25/23  Resp panel by RT-PCR (RSV, Flu A&B, Covid) Anterior Nasal Swab     Status: None   Collection Time: 03/25/23  1:40 PM   Specimen: Anterior Nasal Swab  Result Value Ref Range Status    SARS Coronavirus 2 by RT PCR NEGATIVE NEGATIVE Final    Comment: (NOTE) SARS-CoV-2 target nucleic acids are NOT DETECTED.  The SARS-CoV-2 RNA is generally detectable in upper respiratory specimens during the acute phase of infection. The lowest concentration of SARS-CoV-2 viral copies this assay can detect is 138 copies/mL. A negative result does not preclude SARS-Cov-2 infection and should not be used as the sole basis for treatment or other patient management decisions. A negative result [place] occur with  improper specimen collection/handling, submission of specimen other than nasopharyngeal swab, presence of viral mutation(s) within the areas targeted by this assay, and inadequate number of viral copies(<138 copies/mL). A negative result must be combined with clinical observations, patient history, and epidemiological information. The expected result is Negative.  Fact Sheet for Patients:  bloggercourse.com  Fact Sheet for Healthcare Providers:  seriousbroker.it  This test is no t yet approved or cleared by the [place]  FDA and  has been authorized for detection and/or diagnosis of SARS-CoV-2 by FDA under an Emergency Use Authorization (EUA). This EUA will remain  in effect (meaning this test can be used) for the duration of the COVID-19 declaration under Section 564(b)(1) of the Act, 21 U.S.C.section 360bbb-3(b)(1), unless the authorization is terminated  or revoked sooner.       Influenza A by PCR NEGATIVE NEGATIVE Final   Influenza B by PCR NEGATIVE NEGATIVE Final    Comment: (NOTE) The Xpert Xpress SARS-CoV-2/FLU/RSV plus assay is intended as an aid in the diagnosis of influenza from Nasopharyngeal swab specimens and should not be used as a sole basis for treatment. Nasal washings and aspirates are unacceptable for Xpert Xpress SARS-CoV-2/FLU/RSV testing.  Fact Sheet for  Patients: bloggercourse.com  Fact Sheet for Healthcare Providers: seriousbroker.it  This test is not yet approved or cleared by the [place]  FDA and has been authorized for detection and/or diagnosis of SARS-CoV-2 by FDA under an Emergency Use Authorization (EUA). This EUA will remain in effect (meaning this test can be used) for the duration of the COVID-19 declaration under Section 564(b)(1) of the Act, 21 U.S.C. section 360bbb-3(b)(1), unless the authorization is terminated or revoked.     Resp Syncytial Virus by PCR NEGATIVE NEGATIVE Final    Comment: (NOTE) Fact Sheet for Patients: bloggercourse.com  Fact Sheet for Healthcare Providers: seriousbroker.it  This test is not yet approved or cleared by the [place]  FDA and has been authorized for detection and/or diagnosis of SARS-CoV-2 by FDA under an Emergency Use Authorization (EUA). This EUA will remain in effect (meaning this test can be used) for the duration of the COVID-19 declaration under Section 564(b)(1) of the Act, 21 U.S.C. section 360bbb-3(b)(1), unless the authorization is terminated or revoked.  Performed at [facility], [address] [place], [place] [address]     Labs: CBC: Recent Labs  Lab 03/25/23 1305 03/26/23 0400  WBC 3.3* 3.1*  NEUTROABS 2.2  --   HGB 9.3* 9.0*  HCT 28.3* 27.6*  MCV 85.0 84.4  PLT 72* 63*  Basic Metabolic Panel: Recent Labs  Lab 03/25/23 1305 03/26/23 0400  NA 131* 133*  K 3.3* 3.6  CL 96* 98  CO2 27 25  GLUCOSE 173* 122*  BUN 24* 23  CREATININE 2.23* 2.10*  CALCIUM 8.7* 8.6*  MG  --  2.3  PHOS  --  3.0   Liver Function Tests: Recent Labs  Lab 03/25/23 1305 03/26/23 0400  AST 49* 49*  ALT 23 22  ALKPHOS 77 76  BILITOT 3.5* 3.2*  PROT 6.1* 5.9*  ALBUMIN  2.6* 2.6*   CBG: No results for input(s): GLUCAP in the last 168 hours.  Discharge  time spent: greater than 30 minutes.  Signed: Eric Nunnery, MD [facility] 03/27/2023

## 2023-03-27 NOTE — Progress Notes (Signed)
 Mobility Specialist Progress Note:    03/27/23 1126  Mobility  Activity Ambulated with assistance in hallway  Level of Assistance Standby assist, set-up cues, supervision of patient - no hands on  Assistive Device Four point cane  Distance Ambulated (ft) 70 ft  Range of Motion/Exercises Active;All extremities  Activity Response Tolerated well  Mobility Referral Yes  Mobility visit 1 Mobility  Mobility Specialist Start Time (ACUTE ONLY) 1100  Mobility Specialist Stop Time (ACUTE ONLY) 1118  Mobility Specialist Time Calculation (min) (ACUTE ONLY) 18 min   Pt received in bed, agreeable to mobility. Required SBA to stand and ambulate with quad cane. Tolerated well, audible SOB at EOS. SpO2 95% on RA. Left pt supine, all needs met.   Sherrilee Ditty Mobility Specialist Please contact via Special Educational Needs Teacher or  Rehab office at [phone]

## 2023-03-27 NOTE — Progress Notes (Signed)
 Nsg Discharge Note  Admit Date:  03/25/2023 Discharge date: 03/27/2023   Daniel Crawford to be D/C'd Home per MD order.  AVS completed.   Patient/caregiver able to verbalize understanding.  Discharge Medication: Allergies as of 03/27/2023   No Known Allergies      Medication List     TAKE these medications    acetaminophen  325 MG tablet Commonly known as: TYLENOL  Take 2 tablets (650 mg total) by mouth every 6 (six) hours as needed for mild pain (pain score 1-3), fever or headache (or Fever >/= 101).   albuterol  108 (90 Base) MCG/ACT inhaler Commonly known as: VENTOLIN  HFA Inhale 2 puffs into the lungs every 6 (six) hours as needed for wheezing or shortness of breath.   dapagliflozin  propanediol 10 MG Tabs tablet Commonly known as: Farxiga  Take 1 tablet (10 mg total) by mouth daily before breakfast.   diclofenac Sodium 1 % Gel Commonly known as: VOLTAREN Apply 2 g topically in the morning and at bedtime. 1-2 inches topically twice daily   hydrOXYzine  50 MG capsule Commonly known as: VISTARIL  Take 1 capsule (50 mg total) by mouth 3 (three) times daily as needed for anxiety or nausea.   lactose free nutrition Liqd Take 237 mLs by mouth every evening.   lactulose  10 GM/15ML solution Commonly known as: CHRONULAC  Take 45 mLs (30 g total) by mouth 3 (three) times daily.   levothyroxine  112 MCG tablet Commonly known as: SYNTHROID  Take 1 tablet (112 mcg total) by mouth daily before breakfast. What changed:  medication strength how much to take   ondansetron  4 MG tablet Commonly known as: ZOFRAN  Take 4 mg by mouth 2 (two) times daily as needed for nausea or vomiting.   pantoprazole  40 MG tablet Commonly known as: PROTONIX  Take 1 tablet (40 mg total) by mouth daily.   potassium chloride  SA 20 MEQ tablet Commonly known as: KLOR-CON  M Take 2 tablets (40 mEq total) by mouth daily.   rifaximin  550 MG Tabs tablet Commonly known as: XIFAXAN  Take 1 tablet (550 mg total) by mouth  2 (two) times daily.   sertraline  100 MG tablet Commonly known as: ZOLOFT  Take 100 mg by mouth daily.   spironolactone  100 MG tablet Commonly known as: ALDACTONE  Take 1 tablet (100 mg total) by mouth daily.   tamsulosin  0.4 MG Caps capsule Commonly known as: FLOMAX  Take 1 capsule (0.4 mg total) by mouth daily after supper. What changed: when to take this   torsemide  20 MG tablet Commonly known as: DEMADEX  Take 2 tablets (40 mg total) by mouth 2 (two) times daily. What changed: See the new instructions.   traZODone  50 MG tablet Commonly known as: DESYREL  Take 1 tablet (50 mg total) by mouth at bedtime.   Vitamin D3 1.25 MG (50000 UT) Caps Take 1 capsule by mouth once a week.        Discharge Assessment: Vitals:   03/27/23 0435 03/27/23 0820  BP: (!) 142/84 138/75  Pulse: 79 79  Resp: 16   Temp: 98.3 F (36.8 C)   SpO2: 94%    Skin clean, dry and intact without evidence of skin break down, no evidence of skin tears noted. IV catheter discontinued intact. Site without signs and symptoms of complications - no redness or edema noted at insertion site, patient denies c/o pain - only slight tenderness at site.  Dressing with slight pressure applied.  D/c Instructions-Education: Discharge instructions given to patient/family with verbalized understanding. D/c education completed with patient/family including follow  up instructions, medication list, d/c activities limitations if indicated, with other d/c instructions as indicated by MD - patient able to verbalize understanding, all questions fully answered. Patient instructed to return to ED, call 911, or call MD for any changes in condition.  Patient escorted via WC, and D/C home via private auto.  [name], RN 03/27/2023 4:34 PM

## 2023-03-28 DIAGNOSIS — E039 Hypothyroidism, unspecified: Secondary | ICD-10-CM | POA: Diagnosis not present

## 2023-03-28 DIAGNOSIS — F32A Depression, unspecified: Secondary | ICD-10-CM | POA: Diagnosis not present

## 2023-03-28 DIAGNOSIS — E860 Dehydration: Secondary | ICD-10-CM | POA: Diagnosis not present

## 2023-03-28 DIAGNOSIS — J449 Chronic obstructive pulmonary disease, unspecified: Secondary | ICD-10-CM | POA: Diagnosis not present

## 2023-03-28 DIAGNOSIS — E876 Hypokalemia: Secondary | ICD-10-CM | POA: Diagnosis not present

## 2023-03-28 DIAGNOSIS — E1165 Type 2 diabetes mellitus with hyperglycemia: Secondary | ICD-10-CM | POA: Diagnosis not present

## 2023-03-28 DIAGNOSIS — Z87891 Personal history of nicotine dependence: Secondary | ICD-10-CM | POA: Diagnosis not present

## 2023-03-28 DIAGNOSIS — D696 Thrombocytopenia, unspecified: Secondary | ICD-10-CM | POA: Diagnosis not present

## 2023-03-28 DIAGNOSIS — E872 Acidosis, unspecified: Secondary | ICD-10-CM | POA: Diagnosis not present

## 2023-03-28 DIAGNOSIS — R188 Other ascites: Secondary | ICD-10-CM | POA: Diagnosis not present

## 2023-03-28 DIAGNOSIS — I5032 Chronic diastolic (congestive) heart failure: Secondary | ICD-10-CM | POA: Diagnosis not present

## 2023-03-28 DIAGNOSIS — N1832 Chronic kidney disease, stage 3b: Secondary | ICD-10-CM | POA: Diagnosis not present

## 2023-03-28 DIAGNOSIS — K7581 Nonalcoholic steatohepatitis (NASH): Secondary | ICD-10-CM | POA: Diagnosis not present

## 2023-03-28 DIAGNOSIS — D61818 Other pancytopenia: Secondary | ICD-10-CM | POA: Diagnosis not present

## 2023-03-28 DIAGNOSIS — I130 Hypertensive heart and chronic kidney disease with heart failure and stage 1 through stage 4 chronic kidney disease, or unspecified chronic kidney disease: Secondary | ICD-10-CM | POA: Diagnosis not present

## 2023-03-28 DIAGNOSIS — Z9181 History of falling: Secondary | ICD-10-CM | POA: Diagnosis not present

## 2023-03-28 DIAGNOSIS — E871 Hypo-osmolality and hyponatremia: Secondary | ICD-10-CM | POA: Diagnosis not present

## 2023-03-28 DIAGNOSIS — Z6841 Body Mass Index (BMI) 40.0 and over, adult: Secondary | ICD-10-CM | POA: Diagnosis not present

## 2023-03-28 DIAGNOSIS — M160 Bilateral primary osteoarthritis of hip: Secondary | ICD-10-CM | POA: Diagnosis not present

## 2023-03-28 DIAGNOSIS — Z7984 Long term (current) use of oral hypoglycemic drugs: Secondary | ICD-10-CM | POA: Diagnosis not present

## 2023-03-28 DIAGNOSIS — D631 Anemia in chronic kidney disease: Secondary | ICD-10-CM | POA: Diagnosis not present

## 2023-03-28 DIAGNOSIS — K7460 Unspecified cirrhosis of liver: Secondary | ICD-10-CM | POA: Diagnosis not present

## 2023-03-28 DIAGNOSIS — F419 Anxiety disorder, unspecified: Secondary | ICD-10-CM | POA: Diagnosis not present

## 2023-03-28 DIAGNOSIS — E1122 Type 2 diabetes mellitus with diabetic chronic kidney disease: Secondary | ICD-10-CM | POA: Diagnosis not present

## 2023-04-02 DIAGNOSIS — F32A Depression, unspecified: Secondary | ICD-10-CM | POA: Diagnosis not present

## 2023-04-02 DIAGNOSIS — K7460 Unspecified cirrhosis of liver: Secondary | ICD-10-CM | POA: Diagnosis not present

## 2023-04-02 DIAGNOSIS — E871 Hypo-osmolality and hyponatremia: Secondary | ICD-10-CM | POA: Diagnosis not present

## 2023-04-02 DIAGNOSIS — I130 Hypertensive heart and chronic kidney disease with heart failure and stage 1 through stage 4 chronic kidney disease, or unspecified chronic kidney disease: Secondary | ICD-10-CM | POA: Diagnosis not present

## 2023-04-02 DIAGNOSIS — D696 Thrombocytopenia, unspecified: Secondary | ICD-10-CM | POA: Diagnosis not present

## 2023-04-02 DIAGNOSIS — K7581 Nonalcoholic steatohepatitis (NASH): Secondary | ICD-10-CM | POA: Diagnosis not present

## 2023-04-02 DIAGNOSIS — F419 Anxiety disorder, unspecified: Secondary | ICD-10-CM | POA: Diagnosis not present

## 2023-04-02 DIAGNOSIS — M160 Bilateral primary osteoarthritis of hip: Secondary | ICD-10-CM | POA: Diagnosis not present

## 2023-04-02 DIAGNOSIS — D61818 Other pancytopenia: Secondary | ICD-10-CM | POA: Diagnosis not present

## 2023-04-02 DIAGNOSIS — D631 Anemia in chronic kidney disease: Secondary | ICD-10-CM | POA: Diagnosis not present

## 2023-04-02 DIAGNOSIS — E872 Acidosis, unspecified: Secondary | ICD-10-CM | POA: Diagnosis not present

## 2023-04-02 DIAGNOSIS — N1832 Chronic kidney disease, stage 3b: Secondary | ICD-10-CM | POA: Diagnosis not present

## 2023-04-02 DIAGNOSIS — Z6841 Body Mass Index (BMI) 40.0 and over, adult: Secondary | ICD-10-CM | POA: Diagnosis not present

## 2023-04-02 DIAGNOSIS — I5032 Chronic diastolic (congestive) heart failure: Secondary | ICD-10-CM | POA: Diagnosis not present

## 2023-04-02 DIAGNOSIS — E1165 Type 2 diabetes mellitus with hyperglycemia: Secondary | ICD-10-CM | POA: Diagnosis not present

## 2023-04-02 DIAGNOSIS — E876 Hypokalemia: Secondary | ICD-10-CM | POA: Diagnosis not present

## 2023-04-02 DIAGNOSIS — Z87891 Personal history of nicotine dependence: Secondary | ICD-10-CM | POA: Diagnosis not present

## 2023-04-02 DIAGNOSIS — Z9181 History of falling: Secondary | ICD-10-CM | POA: Diagnosis not present

## 2023-04-02 DIAGNOSIS — J449 Chronic obstructive pulmonary disease, unspecified: Secondary | ICD-10-CM | POA: Diagnosis not present

## 2023-04-02 DIAGNOSIS — E039 Hypothyroidism, unspecified: Secondary | ICD-10-CM | POA: Diagnosis not present

## 2023-04-02 DIAGNOSIS — E1122 Type 2 diabetes mellitus with diabetic chronic kidney disease: Secondary | ICD-10-CM | POA: Diagnosis not present

## 2023-04-02 DIAGNOSIS — R188 Other ascites: Secondary | ICD-10-CM | POA: Diagnosis not present

## 2023-04-02 DIAGNOSIS — E860 Dehydration: Secondary | ICD-10-CM | POA: Diagnosis not present

## 2023-04-02 DIAGNOSIS — Z7984 Long term (current) use of oral hypoglycemic drugs: Secondary | ICD-10-CM | POA: Diagnosis not present

## 2023-04-03 ENCOUNTER — Telehealth (HOSPITAL_COMMUNITY): Payer: Self-pay

## 2023-04-03 NOTE — Telephone Encounter (Signed)
 Called to confirm/remind patient of their appointment [facility] on 04[facility]04[facility]23  Patient reminded to bring all medications and/or complete list.  Confirmed patient has transportation. Gave directions, instructed to utilize valet parking.  Confirmed appointment prior to ending call.

## 2023-04-04 ENCOUNTER — Encounter (HOSPITAL_COMMUNITY): Payer: Self-pay

## 2023-04-04 ENCOUNTER — Ambulatory Visit (HOSPITAL_COMMUNITY)
Admission: RE | Admit: 2023-04-04 | Discharge: 2023-04-04 | Disposition: A | Discharge status: Home / Self Care | Payer: Medicare HMO | Source: Ambulatory Visit | Attending: Cardiology | Admitting: Cardiology

## 2023-04-04 VITALS — BP 122/70 | HR 78 | Wt 335.6 lb

## 2023-04-04 DIAGNOSIS — I272 Pulmonary hypertension, unspecified: Secondary | ICD-10-CM | POA: Insufficient documentation

## 2023-04-04 DIAGNOSIS — E669 Obesity, unspecified: Secondary | ICD-10-CM | POA: Insufficient documentation

## 2023-04-04 DIAGNOSIS — G4733 Obstructive sleep apnea (adult) (pediatric): Secondary | ICD-10-CM | POA: Diagnosis not present

## 2023-04-04 DIAGNOSIS — K7460 Unspecified cirrhosis of liver: Secondary | ICD-10-CM | POA: Diagnosis not present

## 2023-04-04 DIAGNOSIS — J449 Chronic obstructive pulmonary disease, unspecified: Secondary | ICD-10-CM | POA: Diagnosis not present

## 2023-04-04 DIAGNOSIS — I130 Hypertensive heart and chronic kidney disease with heart failure and stage 1 through stage 4 chronic kidney disease, or unspecified chronic kidney disease: Secondary | ICD-10-CM | POA: Diagnosis not present

## 2023-04-04 DIAGNOSIS — N183 Chronic kidney disease, stage 3 unspecified: Secondary | ICD-10-CM | POA: Diagnosis not present

## 2023-04-04 DIAGNOSIS — E1122 Type 2 diabetes mellitus with diabetic chronic kidney disease: Secondary | ICD-10-CM | POA: Diagnosis not present

## 2023-04-04 DIAGNOSIS — Z87891 Personal history of nicotine dependence: Secondary | ICD-10-CM | POA: Insufficient documentation

## 2023-04-04 DIAGNOSIS — Z79899 Other long term (current) drug therapy: Secondary | ICD-10-CM | POA: Insufficient documentation

## 2023-04-04 DIAGNOSIS — K7581 Nonalcoholic steatohepatitis (NASH): Secondary | ICD-10-CM | POA: Insufficient documentation

## 2023-04-04 DIAGNOSIS — R161 Splenomegaly, not elsewhere classified: Secondary | ICD-10-CM | POA: Insufficient documentation

## 2023-04-04 DIAGNOSIS — I5032 Chronic diastolic (congestive) heart failure: Secondary | ICD-10-CM | POA: Insufficient documentation

## 2023-04-04 DIAGNOSIS — D6959 Other secondary thrombocytopenia: Secondary | ICD-10-CM | POA: Insufficient documentation

## 2023-04-04 DIAGNOSIS — Z79890 Hormone replacement therapy (postmenopausal): Secondary | ICD-10-CM | POA: Diagnosis not present

## 2023-04-04 LAB — BASIC METABOLIC PANEL
Anion gap: 11 (ref 5–15)
BUN: 20 mg/dL (ref 8–23)
CO2: 26 mmol/L (ref 22–32)
Calcium: 9 mg/dL (ref 8.9–10.3)
Chloride: 96 mmol/L — ABNORMAL LOW (ref 98–111)
Creatinine, Ser: 2.29 mg/dL — ABNORMAL HIGH (ref 0.61–1.24)
GFR, Estimated: 31 mL/min — ABNORMAL LOW (ref >60)
Glucose, Bld: 129 mg/dL — ABNORMAL HIGH (ref 70–99)
Potassium: 3.5 mmol/L (ref 3.5–5.1)
Sodium: 133 mmol/L — ABNORMAL LOW (ref 135–145)

## 2023-04-04 LAB — BRAIN NATRIURETIC PEPTIDE: B Natriuretic Peptide: 82.8 pg/mL (ref 0.0–100.0)

## 2023-04-04 NOTE — Progress Notes (Signed)
 ADVANCED HF CLINIC NOTE  PCP: [facility], [address] [facility] Cardiology: Dr. Debera HF Cardiology: Dr. Rolan  Chief Complaint: Heart Failure Follow up  HPI:  68 y.o. with history of NASH cirrhosis, chronic diastolic CHF, DM2, CKD stage 3, and COPD was referred by Dr. Debera for evaluation of CHF.  Patient has cirrhosis due to NASH with splenomegaly, thrombocytopenia, and h/o hepatic encephalopathy.  Liver biopsy in 1/23 showed stage IV cirrhosis.  Patient has struggled with volume overload.  He was followed in the heart failure program at [facility] before moving to [place].  RHC in 10/23 showed mildly elevated filling pressures with pulmonary venous hypertension.  Echo in 4/24 showed EF 55%, mild LVH, normal RV systolic function with mild RV enlargement.  He has had several recent admissions with CHF/anasarca and hepatic encephalopathy.  He has never had enough ascites for paracentesis.  He was admitted twice in 7/24 for CHF and diuresed.  Cardiology was not consulted.  For reasons that remain obscure, he was taken off torsemide  and put back on Lasix .   Seen for follow-up on 11/28/22. Weight up about 20 lb after Torsemide  switched to lasix . He was restarted on torsemide  at 60 mg BID. Also started Farxiga . Referred to Paramedicine. Called on 09/18 to report very brisk urine output resulting in incontinence. Torsemide  cut back to 40 mg BID.  Seen back in F/u 12/20/22 and was still significantly volume overloaded. Provider recommended Furoscix  80 mg [place] daily x 3 days then increase torsemide  back to 60 mg bid after that in order to try to keep him out of the hospital. However, he wanted admission for diuresis but refused admission to [facility] and was adamant that he wanted to be admitted to [facility], closer to home. He drove there for admission and was started on IV Lasix  but notes outline that pt left prior to being fully diuresed, despite MD recommendation to stay for further diuresis. He was discharged  still volume overloaded. D/c wt was 370 lb. He was discharged home on torsemide  80 mg bid + once weekly metolazone .   Since follow up in 10/24, he has had multiple hospitalizations for CHF exacerbation and encephalopathy. Most recently admitted 03/25/23-03/27/23, with complaint of weakness and noted to have a lactic acidosis. He was thought to be dehydrated and given IVF. He was restarted on Farxiga  and Torsemide  significantly decreased to 40 bid.  Weight at time of admission 156.3kg.  Today he returns for HF follow up. Feeling fatigued. Reports that he is able to perform all ADLs independently, however just takes him more time than usual. He is still able to grocery shop on his own, and enjoys it. Denies dizziness. Reports improvement in edema since last admission. Weight at home 331 lbs. He is overall down 9 lbs from last admission. He would like to have his ammonia levels checked more frequently as he is scared of becoming confused or obtunded, as has happened before. He is monitoring his Lactulose  to his bowel movements, which are frequent. He also reports that his sleep is often interrupted by need to urinated at night. Taking all medications.   ReDS: 30%  Labs (4/24): NH3 74, K 3.7, creatinine 1.31, BNP 47 Labs (5/24): K 4.1, creatinine 1.67 Labs (8/24): K 3.9, creatinine 1.13, hgb 8.5, plts 61 Labs (9/24): K 3.7, creatinine 1.72, TSH 3.73, Free T4 1.30 Labs (12/24): K3.6, creatinine 2.1, TSH 33, Free T4 0.89  PMH: 1. Cirrhosis: Due to NASH.  Splenomegaly, chronic thrombocytopenia, history of hepatic encephalopathy.  -  Liver biopsy in 1/23 with stage IV cirrhosis.  2. Thrombocytopenia: Due to splenomegaly.  3. Hypothyroidism 4. HTN 5. Type 2 diabetes 6. CKD stage 3 7. COPD: Moderate obstruction on PFTs, prior smoker.  8. HFpEF: RHC (10/23) with mean RA 15, PA 36/20 mean 28, mean PCWP 19, PVR 1.2 WU, CI 2.65.  - Echo (4/24): EF 55%, mild LVH, normal RV systolic function with mild RV  enlargement.  - Echo (10/24): EF 55-60, RV nl - RHC (11/24): RA 9, PA 25/17 (21), PCWP 10, CO/CI (fick) 13.6/5.01, PAPi 0.8  SH: Lives in [place], single, quit smoking 10 years ago, no ETOH.   Family History  Problem Relation Age of Onset   Liver cancer Mother    Heart disease Mother    Aneurysm Sister    ROS: All systems reviewed and negative except as per HPI.   Current Outpatient Medications  Medication Sig Dispense Refill   acetaminophen  (TYLENOL ) 325 MG tablet Take 2 tablets (650 mg total) by mouth every 6 (six) hours as needed for mild pain (pain score 1-3), fever or headache (or Fever >/= 101). 100 tablet 0   albuterol  (VENTOLIN  HFA) 108 (90 Base) MCG/ACT inhaler Inhale 2 puffs into the lungs every 6 (six) hours as needed for wheezing or shortness of breath. 18 g 2   Cholecalciferol (VITAMIN D3) 1.25 MG (50000 UT) CAPS Take 1 capsule by mouth once a week.     dapagliflozin  propanediol (FARXIGA ) 10 MG TABS tablet Take 1 tablet (10 mg total) by mouth daily before breakfast. 90 tablet 3   diclofenac Sodium (VOLTAREN) 1 % GEL Apply 2 g topically in the morning and at bedtime. 1-2 inches topically twice daily     hydrOXYzine  (VISTARIL ) 50 MG capsule Take 1 capsule (50 mg total) by mouth 3 (three) times daily as needed for anxiety or nausea. 90 capsule 2   lactose free nutrition (BOOST) LIQD Take 237 mLs by mouth every evening.     lactulose  (CHRONULAC ) 10 GM/15ML solution Take 45 mLs (30 g total) by mouth 3 (three) times daily. 473 mL 2   levothyroxine  (SYNTHROID ) 112 MCG tablet Take 1 tablet (112 mcg total) by mouth daily before breakfast. 30 tablet 3   ondansetron  (ZOFRAN ) 4 MG tablet Take 4 mg by mouth 2 (two) times daily as needed for nausea or vomiting.     pantoprazole  (PROTONIX ) 40 MG tablet Take 1 tablet (40 mg total) by mouth daily. 90 tablet 3   potassium chloride  SA (KLOR-CON  M) 20 MEQ tablet Take 2 tablets (40 mEq total) by mouth daily. 60 tablet 1   rifaximin  (XIFAXAN ) 550 MG  TABS tablet Take 1 tablet (550 mg total) by mouth 2 (two) times daily. 60 tablet 2   sertraline  (ZOLOFT ) 100 MG tablet Take 100 mg by mouth daily.     spironolactone  (ALDACTONE ) 100 MG tablet Take 1 tablet (100 mg total) by mouth daily. 90 tablet 2   tamsulosin  (FLOMAX ) 0.4 MG CAPS capsule Take 1 capsule (0.4 mg total) by mouth daily after supper.     torsemide  (DEMADEX ) 20 MG tablet Take 2 tablets (40 mg total) by mouth 2 (two) times daily.     traZODone  (DESYREL ) 50 MG tablet Take 1 tablet (50 mg total) by mouth at bedtime. 90 tablet 3   No current facility-administered medications for this encounter.   Wt Readings from Last 3 Encounters:  04/04/23 (!) 152.2 kg (335 lb 9.6 oz)  03/25/23 (!) 156.3 kg (344 lb 9.3  oz)  03/01/23 (!) 150.6 kg (332 lb)   BP 122/70   Pulse 78   Wt (!) 152.2 kg (335 lb 9.6 oz)   SpO2 97%   BMI 43.09 kg/m   PHYSICAL EXAM: General: Chronically ill appearing. No distress on RA HEENT: neck supple.  Sclera jaundice. Cardiac: JVP not elevated. S1 and S2 present. No murmurs or rub. Resp: Lung sounds clear and equal B/L Abdomen: Jaundice. Obese, soft, non-tender, non-distended. + BS. Extremities: Warm and dry. No rash, cyanosis.  Trace edema.  Neuro: Alert and oriented x3. Affect pleasant. Moves all extremities without difficulty.  ECG (personally reviewed): NSR 77 bpm   Assessment/Plan: 1.  Chronic diastolic CHF: RHC in 10/23 with mildly elevated filling pressures and pulmonary venous hypertension.  Echo in 4/24 showed EF 55%, mild LVH, normal RV systolic function with mild RV enlargement.  Patient has struggled with volume retention in setting of diastolic CHF and cirrhosis. Volume status appears okay on exam today.  - NYHA III, confounded by obesity and deconditioning.  - Continue Torsemide  40 mg bid + KCL 40. Decrease at last admission from 80 bid. - Continue Spiro 100 mg daily - Continue Farxiga  10 mg daily. No GU or skin s/s - He would be a reasonable  Cardiomems candidate. Insurance had denied his appeal. - Encouraged to start wearing compression stockings and to elevate legs when able  - Followed by [facility] paramedicine - Check BNP and BNP today  2. NASH cirrhosis: Stage IV by liver biopsy in 1/23.  Suspect due to NASH. He has h/o hepatic encephalopathy as well as splenomegaly with thrombocytopenia.  - Follows with GI in [place].  - Recent LFTs stable. 3. Thrombocytopenia: Due to cirrhosis/splenomegaly. Stable/chronic, 60K range. Other than bruising, denies any other spontaneous bleeding   4. COPD: No longer smokes.  Moderate obstruction on PFTs.  5. OSA: Mild on sleep study in 08/24 - Does not want to use CPAP 6. Abnormal TFTs: recent TSH 33 and T4 0.9 - On Synthroid  per PCP 7. Obesity: he expressed desire to go on a GLP1i  - not a good candidate with h/o pancreatitis  Follow up in 1 month with [facility] d/t high risk for re-admit.  Takeyla Million, NP 04/04/2023

## 2023-04-04 NOTE — Progress Notes (Signed)
 ReDS Vest / Clip - 04/04/23 1300       ReDS Vest / Clip   Station Marker D    Ruler Value 39    ReDS Value Range Low volume    ReDS Actual Value 30

## 2023-04-04 NOTE — Patient Instructions (Signed)
 Medication Changes:  No Changes In Medications at this time.   Lab Work:  Labs done today, your results will be available in MyChart, we will contact you for abnormal readings.  Follow-Up in: 1 month as scheduled   At the [facility], you and your health needs are our priority. We have a designated team specialized in the treatment of Heart Failure. This Care Team includes your primary Heart Failure Specialized Cardiologist (physician), Advanced Practice Providers (APPs- Physician Assistants and Nurse Practitioners), and Pharmacist who all work together to provide you with the care you need, when you need it.   You may see any of the following providers on your designated Care Team at your next follow up:  Dr. Jules Oar Dr. Peder Bourdon Dr. Alwin Baars Dr. Judyth Nunnery Nieves Bars, NP Ruddy Corral, [place] [facility] [place], [place] Dennise Fitz, NP [place] Lee, NP Luster Salters, PharmD   Please be sure to bring in all your medications bottles to every appointment.   Need to Contact Us :  If you have any questions or concerns before your next appointment please send us  a message through [place] or call our office at [phone].    TO LEAVE A MESSAGE FOR THE NURSE SELECT OPTION 2, PLEASE LEAVE A MESSAGE INCLUDING: YOUR NAME DATE OF BIRTH CALL BACK NUMBER REASON FOR CALL**this is important as we prioritize the call backs  YOU WILL RECEIVE A CALL BACK THE SAME DAY AS LONG AS YOU CALL BEFORE 4:00 PM

## 2023-04-05 ENCOUNTER — Telehealth (HOSPITAL_COMMUNITY): Payer: Self-pay | Admitting: Licensed Clinical Social Worker

## 2023-04-05 ENCOUNTER — Telehealth (HOSPITAL_COMMUNITY): Payer: Self-pay

## 2023-04-05 DIAGNOSIS — I5032 Chronic diastolic (congestive) heart failure: Secondary | ICD-10-CM

## 2023-04-05 MED ORDER — POTASSIUM CHLORIDE CRYS ER 20 MEQ PO TBCR: EXTENDED_RELEASE_TABLET | ORAL | Status: DC

## 2023-04-05 MED ORDER — TORSEMIDE 20 MG PO TABS: ORAL_TABLET | ORAL | Status: DC

## 2023-04-05 NOTE — Telephone Encounter (Signed)
 Spoke with patient regarding the following results. Patient made aware and patient verbalized understanding.   Medication list updated  Patient will have labs drawn at [facility] next week- this has been ordered and patient aware of instructions   Advised patient to call back to office with any issues, questions, or concerns. Patient verbalized understanding.

## 2023-04-05 NOTE — Telephone Encounter (Signed)
-----   Message from Jordan Lee sent at 04/04/2023  6:17 PM EST ----- Fluid marker slightly elevated and renal function up.  Please call and have him increase his Torsemide  to 60 qam and 40 qpm with an extra 20 mEq KCL in the evening.  Repeat a BMET/BNP in a week.

## 2023-04-05 NOTE — Telephone Encounter (Signed)
 CSW sent updated clinicals to [facility] Paramedic who follows patient in the community.  Will continue to follow and assist as needed  Burna Sis, LCSW Clinical Social Worker [facility] Desk#: [phone] Cell#: [phone]

## 2023-04-05 NOTE — Addendum Note (Signed)
 Addended by: Bea Laura B on: 04/05/2023 10:45 AM   Modules accepted: Orders

## 2023-04-10 DIAGNOSIS — K7460 Unspecified cirrhosis of liver: Secondary | ICD-10-CM | POA: Diagnosis not present

## 2023-04-10 DIAGNOSIS — K7581 Nonalcoholic steatohepatitis (NASH): Secondary | ICD-10-CM | POA: Diagnosis not present

## 2023-04-10 DIAGNOSIS — E1165 Type 2 diabetes mellitus with hyperglycemia: Secondary | ICD-10-CM | POA: Diagnosis not present

## 2023-04-10 DIAGNOSIS — Z87891 Personal history of nicotine dependence: Secondary | ICD-10-CM | POA: Diagnosis not present

## 2023-04-10 DIAGNOSIS — F32A Depression, unspecified: Secondary | ICD-10-CM | POA: Diagnosis not present

## 2023-04-10 DIAGNOSIS — R188 Other ascites: Secondary | ICD-10-CM | POA: Diagnosis not present

## 2023-04-10 DIAGNOSIS — E860 Dehydration: Secondary | ICD-10-CM | POA: Diagnosis not present

## 2023-04-10 DIAGNOSIS — D631 Anemia in chronic kidney disease: Secondary | ICD-10-CM | POA: Diagnosis not present

## 2023-04-10 DIAGNOSIS — Z7984 Long term (current) use of oral hypoglycemic drugs: Secondary | ICD-10-CM | POA: Diagnosis not present

## 2023-04-10 DIAGNOSIS — D61818 Other pancytopenia: Secondary | ICD-10-CM | POA: Diagnosis not present

## 2023-04-10 DIAGNOSIS — E871 Hypo-osmolality and hyponatremia: Secondary | ICD-10-CM | POA: Diagnosis not present

## 2023-04-10 DIAGNOSIS — F419 Anxiety disorder, unspecified: Secondary | ICD-10-CM | POA: Diagnosis not present

## 2023-04-10 DIAGNOSIS — D696 Thrombocytopenia, unspecified: Secondary | ICD-10-CM | POA: Diagnosis not present

## 2023-04-10 DIAGNOSIS — Z6841 Body Mass Index (BMI) 40.0 and over, adult: Secondary | ICD-10-CM | POA: Diagnosis not present

## 2023-04-10 DIAGNOSIS — J449 Chronic obstructive pulmonary disease, unspecified: Secondary | ICD-10-CM | POA: Diagnosis not present

## 2023-04-10 DIAGNOSIS — E872 Acidosis, unspecified: Secondary | ICD-10-CM | POA: Diagnosis not present

## 2023-04-10 DIAGNOSIS — I130 Hypertensive heart and chronic kidney disease with heart failure and stage 1 through stage 4 chronic kidney disease, or unspecified chronic kidney disease: Secondary | ICD-10-CM | POA: Diagnosis not present

## 2023-04-10 DIAGNOSIS — E1122 Type 2 diabetes mellitus with diabetic chronic kidney disease: Secondary | ICD-10-CM | POA: Diagnosis not present

## 2023-04-10 DIAGNOSIS — E876 Hypokalemia: Secondary | ICD-10-CM | POA: Diagnosis not present

## 2023-04-10 DIAGNOSIS — E039 Hypothyroidism, unspecified: Secondary | ICD-10-CM | POA: Diagnosis not present

## 2023-04-10 DIAGNOSIS — I5032 Chronic diastolic (congestive) heart failure: Secondary | ICD-10-CM | POA: Diagnosis not present

## 2023-04-10 DIAGNOSIS — M160 Bilateral primary osteoarthritis of hip: Secondary | ICD-10-CM | POA: Diagnosis not present

## 2023-04-10 DIAGNOSIS — N1832 Chronic kidney disease, stage 3b: Secondary | ICD-10-CM | POA: Diagnosis not present

## 2023-04-10 DIAGNOSIS — Z9181 History of falling: Secondary | ICD-10-CM | POA: Diagnosis not present

## 2023-04-12 ENCOUNTER — Other Ambulatory Visit (HOSPITAL_COMMUNITY)
Admission: RE | Admit: 2023-04-12 | Discharge: 2023-04-12 | Disposition: A | Discharge status: Home / Self Care | Payer: Medicare HMO | Source: Ambulatory Visit | Attending: Cardiology | Admitting: Cardiology

## 2023-04-12 DIAGNOSIS — I5032 Chronic diastolic (congestive) heart failure: Secondary | ICD-10-CM | POA: Insufficient documentation

## 2023-04-12 LAB — BASIC METABOLIC PANEL
Anion gap: 6 (ref 5–15)
BUN: 25 mg/dL — ABNORMAL HIGH (ref 8–23)
CO2: 29 mmol/L (ref 22–32)
Calcium: 8.7 mg/dL — ABNORMAL LOW (ref 8.9–10.3)
Chloride: 99 mmol/L (ref 98–111)
Creatinine, Ser: 2.2 mg/dL — ABNORMAL HIGH (ref 0.61–1.24)
GFR, Estimated: 32 mL/min — ABNORMAL LOW (ref >60)
Glucose, Bld: 170 mg/dL — ABNORMAL HIGH (ref 70–99)
Potassium: 3.8 mmol/L (ref 3.5–5.1)
Sodium: 134 mmol/L — ABNORMAL LOW (ref 135–145)

## 2023-04-12 LAB — BRAIN NATRIURETIC PEPTIDE: B Natriuretic Peptide: 53 pg/mL (ref 0.0–100.0)

## 2023-04-16 ENCOUNTER — Telehealth: Payer: Self-pay

## 2023-04-16 DIAGNOSIS — K7460 Unspecified cirrhosis of liver: Secondary | ICD-10-CM | POA: Diagnosis not present

## 2023-04-16 DIAGNOSIS — Z7984 Long term (current) use of oral hypoglycemic drugs: Secondary | ICD-10-CM | POA: Diagnosis not present

## 2023-04-16 DIAGNOSIS — E039 Hypothyroidism, unspecified: Secondary | ICD-10-CM | POA: Diagnosis not present

## 2023-04-16 DIAGNOSIS — E872 Acidosis, unspecified: Secondary | ICD-10-CM | POA: Diagnosis not present

## 2023-04-16 DIAGNOSIS — E1165 Type 2 diabetes mellitus with hyperglycemia: Secondary | ICD-10-CM | POA: Diagnosis not present

## 2023-04-16 DIAGNOSIS — E876 Hypokalemia: Secondary | ICD-10-CM | POA: Diagnosis not present

## 2023-04-16 DIAGNOSIS — E1122 Type 2 diabetes mellitus with diabetic chronic kidney disease: Secondary | ICD-10-CM | POA: Diagnosis not present

## 2023-04-16 DIAGNOSIS — E860 Dehydration: Secondary | ICD-10-CM | POA: Diagnosis not present

## 2023-04-16 DIAGNOSIS — K7581 Nonalcoholic steatohepatitis (NASH): Secondary | ICD-10-CM | POA: Diagnosis not present

## 2023-04-16 DIAGNOSIS — D61818 Other pancytopenia: Secondary | ICD-10-CM | POA: Diagnosis not present

## 2023-04-16 DIAGNOSIS — I5032 Chronic diastolic (congestive) heart failure: Secondary | ICD-10-CM | POA: Diagnosis not present

## 2023-04-16 DIAGNOSIS — M160 Bilateral primary osteoarthritis of hip: Secondary | ICD-10-CM | POA: Diagnosis not present

## 2023-04-16 DIAGNOSIS — D631 Anemia in chronic kidney disease: Secondary | ICD-10-CM | POA: Diagnosis not present

## 2023-04-16 DIAGNOSIS — J449 Chronic obstructive pulmonary disease, unspecified: Secondary | ICD-10-CM | POA: Diagnosis not present

## 2023-04-16 DIAGNOSIS — F419 Anxiety disorder, unspecified: Secondary | ICD-10-CM | POA: Diagnosis not present

## 2023-04-16 DIAGNOSIS — N1832 Chronic kidney disease, stage 3b: Secondary | ICD-10-CM | POA: Diagnosis not present

## 2023-04-16 DIAGNOSIS — Z87891 Personal history of nicotine dependence: Secondary | ICD-10-CM | POA: Diagnosis not present

## 2023-04-16 DIAGNOSIS — Z6841 Body Mass Index (BMI) 40.0 and over, adult: Secondary | ICD-10-CM | POA: Diagnosis not present

## 2023-04-16 DIAGNOSIS — R188 Other ascites: Secondary | ICD-10-CM | POA: Diagnosis not present

## 2023-04-16 DIAGNOSIS — Z9181 History of falling: Secondary | ICD-10-CM | POA: Diagnosis not present

## 2023-04-16 DIAGNOSIS — I130 Hypertensive heart and chronic kidney disease with heart failure and stage 1 through stage 4 chronic kidney disease, or unspecified chronic kidney disease: Secondary | ICD-10-CM | POA: Diagnosis not present

## 2023-04-16 DIAGNOSIS — E871 Hypo-osmolality and hyponatremia: Secondary | ICD-10-CM | POA: Diagnosis not present

## 2023-04-16 DIAGNOSIS — D696 Thrombocytopenia, unspecified: Secondary | ICD-10-CM | POA: Diagnosis not present

## 2023-04-16 DIAGNOSIS — F32A Depression, unspecified: Secondary | ICD-10-CM | POA: Diagnosis not present

## 2023-04-16 NOTE — Telephone Encounter (Signed)
Returned the pt's call (where he had left a message twice regarding his kidneys). LMOVM for the pt to return call

## 2023-04-17 DIAGNOSIS — E039 Hypothyroidism, unspecified: Secondary | ICD-10-CM | POA: Diagnosis not present

## 2023-04-17 DIAGNOSIS — K7460 Unspecified cirrhosis of liver: Secondary | ICD-10-CM | POA: Diagnosis not present

## 2023-04-22 ENCOUNTER — Emergency Department (HOSPITAL_COMMUNITY)
Admission: EM | Admit: 2023-04-22 | Discharge: 2023-04-23 | Disposition: A | Discharge status: Home / Self Care | Payer: Medicare HMO | Attending: Emergency Medicine | Admitting: Emergency Medicine

## 2023-04-22 ENCOUNTER — Other Ambulatory Visit: Payer: Self-pay

## 2023-04-22 ENCOUNTER — Encounter (HOSPITAL_COMMUNITY): Payer: Self-pay

## 2023-04-22 ENCOUNTER — Emergency Department (HOSPITAL_COMMUNITY): Payer: Medicare HMO

## 2023-04-22 DIAGNOSIS — E871 Hypo-osmolality and hyponatremia: Secondary | ICD-10-CM | POA: Diagnosis not present

## 2023-04-22 DIAGNOSIS — D61818 Other pancytopenia: Secondary | ICD-10-CM | POA: Insufficient documentation

## 2023-04-22 DIAGNOSIS — I130 Hypertensive heart and chronic kidney disease with heart failure and stage 1 through stage 4 chronic kidney disease, or unspecified chronic kidney disease: Secondary | ICD-10-CM | POA: Diagnosis not present

## 2023-04-22 DIAGNOSIS — D631 Anemia in chronic kidney disease: Secondary | ICD-10-CM | POA: Diagnosis not present

## 2023-04-22 DIAGNOSIS — M160 Bilateral primary osteoarthritis of hip: Secondary | ICD-10-CM | POA: Diagnosis not present

## 2023-04-22 DIAGNOSIS — F32A Depression, unspecified: Secondary | ICD-10-CM | POA: Diagnosis not present

## 2023-04-22 DIAGNOSIS — K429 Umbilical hernia without obstruction or gangrene: Secondary | ICD-10-CM | POA: Diagnosis not present

## 2023-04-22 DIAGNOSIS — E876 Hypokalemia: Secondary | ICD-10-CM | POA: Diagnosis not present

## 2023-04-22 DIAGNOSIS — N289 Disorder of kidney and ureter, unspecified: Secondary | ICD-10-CM | POA: Diagnosis not present

## 2023-04-22 DIAGNOSIS — N1832 Chronic kidney disease, stage 3b: Secondary | ICD-10-CM | POA: Diagnosis not present

## 2023-04-22 DIAGNOSIS — Z87891 Personal history of nicotine dependence: Secondary | ICD-10-CM | POA: Diagnosis not present

## 2023-04-22 DIAGNOSIS — J449 Chronic obstructive pulmonary disease, unspecified: Secondary | ICD-10-CM | POA: Diagnosis not present

## 2023-04-22 DIAGNOSIS — Z9181 History of falling: Secondary | ICD-10-CM | POA: Diagnosis not present

## 2023-04-22 DIAGNOSIS — K7469 Other cirrhosis of liver: Secondary | ICD-10-CM | POA: Diagnosis not present

## 2023-04-22 DIAGNOSIS — K7460 Unspecified cirrhosis of liver: Secondary | ICD-10-CM | POA: Insufficient documentation

## 2023-04-22 DIAGNOSIS — R1031 Right lower quadrant pain: Secondary | ICD-10-CM | POA: Diagnosis not present

## 2023-04-22 DIAGNOSIS — E872 Acidosis, unspecified: Secondary | ICD-10-CM | POA: Diagnosis not present

## 2023-04-22 DIAGNOSIS — D696 Thrombocytopenia, unspecified: Secondary | ICD-10-CM | POA: Diagnosis not present

## 2023-04-22 DIAGNOSIS — E039 Hypothyroidism, unspecified: Secondary | ICD-10-CM | POA: Diagnosis not present

## 2023-04-22 DIAGNOSIS — Z7984 Long term (current) use of oral hypoglycemic drugs: Secondary | ICD-10-CM | POA: Diagnosis not present

## 2023-04-22 DIAGNOSIS — E1122 Type 2 diabetes mellitus with diabetic chronic kidney disease: Secondary | ICD-10-CM | POA: Diagnosis not present

## 2023-04-22 DIAGNOSIS — R161 Splenomegaly, not elsewhere classified: Secondary | ICD-10-CM | POA: Diagnosis not present

## 2023-04-22 DIAGNOSIS — K7581 Nonalcoholic steatohepatitis (NASH): Secondary | ICD-10-CM | POA: Diagnosis not present

## 2023-04-22 DIAGNOSIS — I5032 Chronic diastolic (congestive) heart failure: Secondary | ICD-10-CM | POA: Diagnosis not present

## 2023-04-22 DIAGNOSIS — E1165 Type 2 diabetes mellitus with hyperglycemia: Secondary | ICD-10-CM | POA: Diagnosis not present

## 2023-04-22 DIAGNOSIS — Z6841 Body Mass Index (BMI) 40.0 and over, adult: Secondary | ICD-10-CM | POA: Diagnosis not present

## 2023-04-22 DIAGNOSIS — E860 Dehydration: Secondary | ICD-10-CM | POA: Diagnosis not present

## 2023-04-22 DIAGNOSIS — R109 Unspecified abdominal pain: Secondary | ICD-10-CM

## 2023-04-22 DIAGNOSIS — F419 Anxiety disorder, unspecified: Secondary | ICD-10-CM | POA: Diagnosis not present

## 2023-04-22 DIAGNOSIS — R188 Other ascites: Secondary | ICD-10-CM | POA: Insufficient documentation

## 2023-04-22 LAB — CBC WITH DIFFERENTIAL/PLATELET
Abs Immature Granulocytes: 0 10*3/uL (ref 0.00–0.07)
Basophils Absolute: 0 10*3/uL (ref 0.0–0.1)
Basophils Relative: 1 %
Eosinophils Absolute: 0.3 10*3/uL (ref 0.0–0.5)
Eosinophils Relative: 9 %
HCT: 30.1 % — ABNORMAL LOW (ref 39.0–52.0)
Hemoglobin: 9.5 g/dL — ABNORMAL LOW (ref 13.0–17.0)
Immature Granulocytes: 0 %
Lymphocytes Relative: 22 %
Lymphs Abs: 0.8 10*3/uL (ref 0.7–4.0)
MCH: 27.2 pg (ref 26.0–34.0)
MCHC: 31.6 g/dL (ref 30.0–36.0)
MCV: 86.2 fL (ref 80.0–100.0)
Monocytes Absolute: 0.4 10*3/uL (ref 0.1–1.0)
Monocytes Relative: 11 %
Neutro Abs: 2.1 10*3/uL (ref 1.7–7.7)
Neutrophils Relative %: 57 %
Platelets: 72 10*3/uL — ABNORMAL LOW (ref 150–400)
RBC: 3.49 MIL/uL — ABNORMAL LOW (ref 4.22–5.81)
RDW: 18.3 % — ABNORMAL HIGH (ref 11.5–15.5)
WBC: 3.6 10*3/uL — ABNORMAL LOW (ref 4.0–10.5)
nRBC: 0 % (ref 0.0–0.2)

## 2023-04-22 LAB — HEPATIC FUNCTION PANEL
ALT: 23 U/L (ref 0–44)
AST: 54 U/L — ABNORMAL HIGH (ref 15–41)
Albumin: 2.8 g/dL — ABNORMAL LOW (ref 3.5–5.0)
Alkaline Phosphatase: 92 U/L (ref 38–126)
Bilirubin, Direct: 1.1 mg/dL — ABNORMAL HIGH (ref 0.0–0.2)
Indirect Bilirubin: 2.6 mg/dL — ABNORMAL HIGH (ref 0.3–0.9)
Total Bilirubin: 3.7 mg/dL — ABNORMAL HIGH (ref 0.0–1.2)
Total Protein: 6.5 g/dL (ref 6.5–8.1)

## 2023-04-22 LAB — BASIC METABOLIC PANEL
Anion gap: 9 (ref 5–15)
BUN: 27 mg/dL — ABNORMAL HIGH (ref 8–23)
CO2: 30 mmol/L (ref 22–32)
Calcium: 8.9 mg/dL (ref 8.9–10.3)
Chloride: 95 mmol/L — ABNORMAL LOW (ref 98–111)
Creatinine, Ser: 2.43 mg/dL — ABNORMAL HIGH (ref 0.61–1.24)
GFR, Estimated: 29 mL/min — ABNORMAL LOW (ref >60)
Glucose, Bld: 121 mg/dL — ABNORMAL HIGH (ref 70–99)
Potassium: 3.4 mmol/L — ABNORMAL LOW (ref 3.5–5.1)
Sodium: 134 mmol/L — ABNORMAL LOW (ref 135–145)

## 2023-04-22 LAB — AMMONIA: Ammonia: 61 umol/L — ABNORMAL HIGH (ref 9–35)

## 2023-04-22 LAB — LIPASE, BLOOD: Lipase: 44 U/L (ref 11–51)

## 2023-04-22 NOTE — ED Triage Notes (Signed)
Pt arrived via POV from home c/o recurring back pain. Pt reports he spoke with his PCP last week and was advised he Zehner need some scan. Pt reports pain has been worsening over the past few weeks. Pt reports he was prescribed pain medication, but does not like taking pain medications due to his cirrhosis.

## 2023-04-22 NOTE — ED Provider Notes (Signed)
EMERGENCY DEPARTMENT AT [facility] Provider Note   CSN: [id number] Arrival date & time: 04/22/23  1606     History {Add pertinent medical, surgical, social history, OB history to HPI:1} Chief Complaint  Patient presents with   Back Pain    Daniel Crawford is a 68 y.o. male.  The history is provided by the patient.  Back Pain He has history of hypertension, diabetes, heart failure with preserved ejection fraction, COPD, cirrhosis of the liver, chronic kidney disease and comes in complaining of right flank pain for the last 2 weeks.  Pain radiates around to the right lower abdomen.  There is some associated nausea but no vomiting.  He denies any fever or chills.  He denies constipation or diarrhea.  He denies any urinary difficulty.  His primary care provider had prescribed oxycodone for him, and it does give him relief, but he does not like taking it because it does tend to constipate him.   Home Medications Prior to Admission medications   Medication Sig Start Date End Date Taking? Authorizing Provider  acetaminophen (TYLENOL) 325 MG tablet Take 2 tablets (650 mg total) by mouth every 6 (six) hours as needed for mild pain (pain score 1-3), fever or headache (or Fever >/= 101). 02/08/23   Shon Hale, MD  albuterol (VENTOLIN HFA) 108 (90 Base) MCG/ACT inhaler Inhale 2 puffs into the lungs every 6 (six) hours as needed for wheezing or shortness of breath. 02/08/23   Shon Hale, MD  Cholecalciferol (VITAMIN D3) 1.25 MG (50000 UT) CAPS Take 1 capsule by mouth once a week. 03/06/23   [provider]  dapagliflozin propanediol (FARXIGA) 10 MG TABS tablet Take 1 tablet (10 mg total) by mouth daily before breakfast. 02/08/23   Shon Hale, MD  diclofenac Sodium (VOLTAREN) 1 % GEL Apply 2 g topically in the morning and at bedtime. 1-2 inches topically twice daily 03/06/23   [provider]  hydrOXYzine (VISTARIL) 50 MG capsule Take 1 capsule (50 mg  total) by mouth 3 (three) times daily as needed for anxiety or nausea. 02/08/23   Shon Hale, MD  lactose free nutrition (BOOST) LIQD Take 237 mLs by mouth every evening.    [provider]  lactulose (CHRONULAC) 10 GM/15ML solution Take 45 mLs (30 g total) by mouth 3 (three) times daily. 02/08/23   Shon Hale, MD  levothyroxine (SYNTHROID) 112 MCG tablet Take 1 tablet (112 mcg total) by mouth daily before breakfast. 03/27/23   Vassie Loll, MD  ondansetron (ZOFRAN) 4 MG tablet Take 4 mg by mouth 2 (two) times daily as needed for nausea or vomiting. 10/28/22   [provider]  pantoprazole (PROTONIX) 40 MG tablet Take 1 tablet (40 mg total) by mouth daily. 01/25/22   Shon Hale, MD  potassium chloride SA (KLOR-CON M) 20 MEQ tablet Take 2 tablets in the morning and 1 tablet  in the evening 04/05/23   Lee, [place], NP  rifaximin (XIFAXAN) 550 MG TABS tablet Take 1 tablet (550 mg total) by mouth 2 (two) times daily. 02/08/23   Shon Hale, MD  sertraline (ZOLOFT) 100 MG tablet Take 100 mg by mouth daily. 09/24/22   [provider]  spironolactone (ALDACTONE) 100 MG tablet Take 1 tablet (100 mg total) by mouth daily. 02/08/23   Shon Hale, MD  tamsulosin (FLOMAX) 0.4 MG CAPS capsule Take 1 capsule (0.4 mg total) by mouth daily after supper. 03/27/23   Vassie Loll, MD  torsemide [facility])  20 MG tablet Take 60mg  in the morning and 40mg  in the evening 04/05/23   Lee, [place], NP  traZODone (DESYREL) 50 MG tablet Take 1 tablet (50 mg total) by mouth at bedtime. 02/08/23   Shon Hale, MD      Allergies    Patient has no known allergies.    Review of Systems   Review of Systems  Musculoskeletal:  Positive for back pain.  All other systems reviewed and are negative.   Physical Exam Updated Vital Signs BP (!) 118/59 (BP Location: Right Arm)   Pulse 81   Temp 98.4 F (36.9 C) (Oral)   Resp 19   Ht 6\' 2"  (1.88 m)   Wt (!) 152.2 kg    SpO2 100%   BMI 43.09 kg/m  Physical Exam Vitals and nursing note reviewed.   68 year old male, resting comfortably and in no acute distress. Vital signs are normal. Oxygen saturation is 100%, which is normal. Head is normocephalic and atraumatic. PERRLA, EOMI. Oropharynx is clear.  There is no scleral icterus. Neck is nontender and supple without adenopathy. Back is nontender in the midline.  There is mild right CVA tenderness. Lungs are clear without rales, wheezes, or rhonchi. Chest is nontender. Heart has regular rate and rhythm without murmur. Abdomen is soft, flat, with mild to moderate tenderness in the right mid and lower abdomen.  There is no rebound or guarding. Extremities have 2+ edema, full range of motion is present. Skin is warm and dry without rash. Neurologic: Mental status is normal, cranial nerves are intact, moves all extremities equally.  ED Results / Procedures / Treatments   Labs (all labs ordered are listed, but only abnormal results are displayed) Labs Reviewed  CBC WITH DIFFERENTIAL/PLATELET - Abnormal; Notable for the following components:      Result Value   WBC 3.6 (*)    RBC 3.49 (*)    Hemoglobin 9.5 (*)    HCT 30.1 (*)    RDW 18.3 (*)    Platelets 72 (*)    All other components within normal limits  AMMONIA - Abnormal; Notable for the following components:   Ammonia 61 (*)    All other components within normal limits  BASIC METABOLIC PANEL - Abnormal; Notable for the following components:   Sodium 134 (*)    Potassium 3.4 (*)    Chloride 95 (*)    Glucose, Bld 121 (*)    BUN 27 (*)    Creatinine, Ser 2.43 (*)    GFR, Estimated 29 (*)    All other components within normal limits  LIPASE, BLOOD  URINALYSIS, W/ REFLEX TO CULTURE (INFECTION SUSPECTED)  HEPATIC FUNCTION PANEL    EKG None  Radiology No results found.  Procedures Procedures  {Document cardiac monitor, telemetry assessment procedure when appropriate:1}  Medications  Ordered in ED Medications - No data to display  ED Course/ Medical Decision Making/ A&P   {   Click here for ABCD2, HEART and other calculatorsREFRESH Note before signing :1}                              Medical Decision Making Amount and/or Complexity of Data Reviewed Labs: ordered.   Right lower quadrant and flank pain.  Differential diagnosis includes, but is not limited to, pyelonephritis, urolithiasis, appendicitis, diverticulitis, pancreatitis, cholecystitis.  With localized tenderness, doubt spontaneous bacterial peritonitis.  This differential includes conditions with significant risk of morbidity  and complications.  I have reviewed his laboratory tests, and my interpretation is mild hyponatremia which is chronic and not felt to be clinically significant, borderline hypokalemia, elevated creatinine which is slightly worse than most recent value but in the range that he has been at previously, normal lipase, mildly elevated ammonia but at a level that appears to be his baseline, pancytopenia not significantly changed from baseline.  I have ordered hepatic function panel and urinalysis.  I have ordered renal stone protocol CT scan.  I reviewed his past records, and on 10/20/2022 he had a CT of abdomen and pelvis which showed circumferential thickening of the ascending colon likely from portal colopathy, also nonobstructive nephrolithiasis including a 3 mm calculus in the right kidney.  {Document critical care time when appropriate:1} {Document review of labs and clinical decision tools ie heart score, Chads2Vasc2 etc:1}  {Document your independent review of radiology images, and any outside records:1} {Document your discussion with family members, caretakers, and with consultants:1} {Document social determinants of health affecting pt's care:1} {Document your decision making why or why not admission, treatments were needed:1} Final Clinical Impression(s) / ED Diagnoses Final diagnoses:   None    Rx / DC Orders ED Discharge Orders     None

## 2023-04-23 DIAGNOSIS — K429 Umbilical hernia without obstruction or gangrene: Secondary | ICD-10-CM | POA: Diagnosis not present

## 2023-04-23 DIAGNOSIS — K7469 Other cirrhosis of liver: Secondary | ICD-10-CM | POA: Diagnosis not present

## 2023-04-23 DIAGNOSIS — R188 Other ascites: Secondary | ICD-10-CM | POA: Diagnosis not present

## 2023-04-23 DIAGNOSIS — R161 Splenomegaly, not elsewhere classified: Secondary | ICD-10-CM | POA: Diagnosis not present

## 2023-04-23 LAB — URINALYSIS, W/ REFLEX TO CULTURE (INFECTION SUSPECTED)
Bacteria, UA: NONE SEEN
Bilirubin Urine: NEGATIVE
Glucose, UA: NEGATIVE mg/dL
Ketones, ur: NEGATIVE mg/dL
Leukocytes,Ua: NEGATIVE
Nitrite: NEGATIVE
Protein, ur: NEGATIVE mg/dL
Specific Gravity, Urine: 1.013 (ref 1.005–1.030)
pH: 5 (ref 5.0–8.0)

## 2023-04-23 MED ORDER — OXYCODONE HCL 5 MG PO TABS: 5.0000 mg | ORAL_TABLET | Freq: Once | ORAL | Status: DC

## 2023-04-23 MED ORDER — OXYCODONE HCL 5 MG PO TABS
10.0000 mg | ORAL_TABLET | Freq: Once | ORAL | Status: AC
  Administered 2023-04-23: 10 mg via ORAL
  Filled 2023-04-23: qty 2

## 2023-04-23 MED ORDER — POTASSIUM CHLORIDE CRYS ER 20 MEQ PO TBCR
40.0000 meq | EXTENDED_RELEASE_TABLET | Freq: Once | ORAL | Status: AC
  Administered 2023-04-23: 40 meq via ORAL
  Filled 2023-04-23: qty 2

## 2023-04-23 NOTE — ED Notes (Signed)
ED Provider at bedside.

## 2023-04-23 NOTE — Discharge Instructions (Addendum)
Your value patient today did not show any serious causes for your flank pain.  You Montroy continue taking oxycodone as needed for pain.  Please follow-up with your gastroenterologist.  Return if symptoms are getting worse.

## 2023-04-24 LAB — URINE CULTURE: Culture: <10000 — AB

## 2023-04-25 ENCOUNTER — Ambulatory Visit (INDEPENDENT_AMBULATORY_CARE_PROVIDER_SITE_OTHER): Payer: Medicare HMO | Admitting: Gastroenterology

## 2023-04-25 ENCOUNTER — Encounter: Payer: Self-pay | Admitting: Gastroenterology

## 2023-04-25 VITALS — BP 132/70 | HR 73 | Temp 98.1°F | Ht 74.0 in | Wt 348.0 lb

## 2023-04-25 DIAGNOSIS — K7469 Other cirrhosis of liver: Secondary | ICD-10-CM

## 2023-04-25 DIAGNOSIS — K7460 Unspecified cirrhosis of liver: Secondary | ICD-10-CM | POA: Diagnosis not present

## 2023-04-25 DIAGNOSIS — M545 Low back pain, unspecified: Secondary | ICD-10-CM | POA: Diagnosis not present

## 2023-04-25 NOTE — Patient Instructions (Signed)
Please have back xray done as soon as you can.  Continue current medications as you are doing.  Please go to the emergency room if any worsening!  We will see you in 3 months for liver care.  I enjoyed seeing you again today! I value our relationship and want to provide genuine, compassionate, and quality care. You Baik receive a survey regarding your visit with me, and I welcome your feedback! Thanks so much for taking the time to complete this. I look forward to seeing you again.      Gelene Mink, PhD, ANP-BC [facility]

## 2023-04-25 NOTE — Progress Notes (Addendum)
Gastroenterology Office Note     Primary Care Physician:  [facility]  Primary Gastroenterologist: Dr. Levon Hedger    Chief Complaint   Chief Complaint  Patient presents with   Follow-up    Follow up Emergency Room visit. Cirrhosis, right side pain.      History of Present Illness   Olivier Dotts is a 68 y.o. 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 Last hospitalization July/August 2024 with decompensated cirrhosis with anasarca, CT findings of colitis felt moreso to be portal colopathy.  Lower back pain for the past 3 weeks. Worsened with movement. Hurts to sit. Bilateral buttock discomfort. Tired of hurting. Longer sits, worse it gets. Went to ED about this on 1/27. CT renal study without acute findings.   Lactulose at least BID. Xifaxan BID. Bowels 3-4 times per day. No mental status changes or confusion.   Spironolactone 100 mg, torsemide 60 in am, 40 in evening. He notes improvement overall in lower extremity edema and anasarca since last seen. No added salt.   No longer on hospice.     Colonoscopy 2023 with cecal polyp unable to be removed and due for EGD for variceal screening but is not an anesthesia candidate. Conservative measures recommended.   Last EGD: 11/06/2019 - Normal esophagus. Dilated. - Portal hypertensive gastropathy. - Normal examined duodenum. - No specimens collected.  Past Medical History:  Diagnosis Date   Acute pancreatitis 10/02/2022   Anasarca 01/23/2022   Anemia    Anxiety    Asthma    CHF (congestive heart failure) (HCC)    CKD (chronic kidney disease)    D-dimer, elevated 01/22/2022   Decompensation of cirrhosis of liver (HCC) 05/21/2022   Depression    Diabetes mellitus without complication (HCC)    Dyspnea    Hepatic  encephalopathy (HCC) 07/10/2022   Hypertension    Hypothyroidism    Liver cirrhosis secondary to NASH (HCC)    NASH (nonalcoholic steatohepatitis)    Other ascites 06/19/2019   Pancytopenia (HCC) 05/21/2022   Pre-diabetes    Spleen enlarged    Thrombocytopenia (HCC)     Past Surgical History:  Procedure Laterality Date   Bilateral hernia surgery     2006, 2007   CHOLECYSTECTOMY     2016    COLONOSCOPY WITH PROPOFOL N/A 06/28/2021   Procedure: COLONOSCOPY WITH PROPOFOL;  Surgeon: Malissa Hippo, MD;  [facility];  Service: Endoscopy;  Laterality: N/A;  1020   ESOPHAGEAL DILATION N/A 11/06/2019   Procedure: ESOPHAGEAL DILATION;  Surgeon: Dolores Frame, MD;  Location: [facility];  Service: Gastroenterology;  Laterality: N/A;   ESOPHAGOGASTRODUODENOSCOPY (EGD) WITH PROPOFOL N/A 11/06/2019   Procedure: ESOPHAGOGASTRODUODENOSCOPY (EGD) WITH PROPOFOL;  Surgeon: Dolores Frame, MD;  Location: [facility];  Service: Gastroenterology;  Laterality: N/A;  1045   IR RADIOLOGIST EVAL & MGMT  02/19/2022   LAPAROSCOPIC ASSISTED VENTRAL HERNIA REPAIR     Polyp removed     in January of 2018.    POLYPECTOMY  06/28/2021   Procedure: POLYPECTOMY INTESTINAL;  Surgeon: Malissa Hippo, MD;  Location: [facility];  Service: Endoscopy;;   RIGHT HEART CATH N/A 01/08/2022   Procedure: RIGHT HEART CATH;  Surgeon: Corky Crafts, MD;  Location: [facility] INVASIVE CV LAB;  Service: Cardiovascular;  Laterality: N/A;  RIGHT HEART CATH N/A 01/29/2023   Procedure: RIGHT HEART CATH;  Surgeon: Laurey Morale, MD;  Location: [facility] INVASIVE CV LAB;  Service: Cardiovascular;  Laterality: N/A;    Current Outpatient Medications  Medication Sig Dispense Refill   acetaminophen (TYLENOL) 325 MG tablet Take 2 tablets (650 mg total) by mouth every 6 (six) hours as needed for mild pain (pain score 1-3), fever or headache (or Fever >/= 101). 100 tablet 0   albuterol (VENTOLIN HFA) 108  (90 Base) MCG/ACT inhaler Inhale 2 puffs into the lungs every 6 (six) hours as needed for wheezing or shortness of breath. 18 g 2   Cholecalciferol (VITAMIN D3) 1.25 MG (50000 UT) CAPS Take 1 capsule by mouth once a week.     dapagliflozin propanediol (FARXIGA) 10 MG TABS tablet Take 1 tablet (10 mg total) by mouth daily before breakfast. 90 tablet 3   diclofenac Sodium (VOLTAREN) 1 % GEL Apply 2 g topically in the morning and at bedtime. 1-2 inches topically twice daily     hydrOXYzine (VISTARIL) 50 MG capsule Take 1 capsule (50 mg total) by mouth 3 (three) times daily as needed for anxiety or nausea. 90 capsule 2   lactose free nutrition (BOOST) LIQD Take 237 mLs by mouth every evening.     lactulose (CHRONULAC) 10 GM/15ML solution Take 45 mLs (30 g total) by mouth 3 (three) times daily. 473 mL 2   levothyroxine (SYNTHROID) 112 MCG tablet Take 1 tablet (112 mcg total) by mouth daily before breakfast. 30 tablet 3   ondansetron (ZOFRAN) 4 MG tablet Take 4 mg by mouth 2 (two) times daily as needed for nausea or vomiting.     pantoprazole (PROTONIX) 40 MG tablet Take 1 tablet (40 mg total) by mouth daily. 90 tablet 3   potassium chloride SA (KLOR-CON M) 20 MEQ tablet Take 2 tablets in the morning and 1 tablet  in the evening     rifaximin (XIFAXAN) 550 MG TABS tablet Take 1 tablet (550 mg total) by mouth 2 (two) times daily. 60 tablet 2   sertraline (ZOLOFT) 100 MG tablet Take 100 mg by mouth daily.     spironolactone (ALDACTONE) 100 MG tablet Take 1 tablet (100 mg total) by mouth daily. 90 tablet 2   tamsulosin (FLOMAX) 0.4 MG CAPS capsule Take 1 capsule (0.4 mg total) by mouth daily after supper.     torsemide (DEMADEX) 20 MG tablet Take 60mg  in the morning and 40mg  in the evening     traZODone (DESYREL) 50 MG tablet Take 1 tablet (50 mg total) by mouth at bedtime. 90 tablet 3   No current facility-administered medications for this visit.    Allergies as of 04/25/2023   (No Known  Allergies)    Family History  Problem Relation Age of Onset   Liver cancer Mother    Heart disease Mother    Aneurysm Sister     Social History   Socioeconomic History   Marital status: Single    Spouse name: Not on file   Number of children: Not on file   Years of education: Not on file   Highest education level: Not on file  Occupational History   Not on file  Tobacco Use   Smoking status: Former    Current packs/day: 0.00    Average packs/day: 1 pack/day for 20.0 years (20.0 ttl pk-yrs)    Types: Cigarettes    Start date: 63    Quit date: 2017  Years since quitting: 8.0    Passive exposure: Current   Smokeless tobacco: Never  Vaping Use   Vaping status: Never Used  Substance and Sexual Activity   Alcohol use: No   Drug use: No   Sexual activity: Not Currently  Other Topics Concern   Not on file  Social History Narrative   Not on file   Social Drivers of Health   Financial Resource Strain: Low Risk  (03/28/2021)   Received from [facility]   Overall Financial Resource Strain (CARDIA)    Difficulty of Paying Living Expenses: Not hard at all  Food Insecurity: No Food Insecurity (03/25/2023)   Hunger Vital Sign    Worried About Running Out of Food in the Last Year: Never true    Ran Out of Food in the Last Year: Never true  Transportation Needs: No Transportation Needs (03/25/2023)   PRAPARE - Administrator, Civil Service (Medical): No    Lack of Transportation (Non-Medical): No  Physical Activity: Not on file  Stress: Not on file  Social Connections: Moderately Integrated (03/25/2023)   Social Connection and Isolation Panel [NHANES]    Frequency of Communication with Friends and Family: Three times a week    Frequency of Social Gatherings with Friends and Family: Three times a week    Attends Religious Services: 1 to 4 times per year    Active Member of Clubs or Organizations: No    Attends Banker  Meetings: 1 to 4 times per year    Marital Status: Divorced  Catering manager Violence: Not At Risk (03/25/2023)   Humiliation, Afraid, Rape, and Kick questionnaire    Fear of Current or Ex-Partner: No    Emotionally Abused: No    Physically Abused: No    Sexually Abused: No     Review of Systems   Gen: Denies any fever, chills, fatigue, weight loss, lack of appetite.  CV: Denies chest pain, heart palpitations, peripheral edema, syncope.  Resp: Denies shortness of breath at rest or with exertion. Denies wheezing or cough.  GI: Denies dysphagia or odynophagia. Denies jaundice, hematemesis, fecal incontinence. GU : Denies urinary burning, urinary frequency, urinary hesitancy MS: Denies joint pain, muscle weakness, cramps, or limitation of movement.  Derm: Denies rash, itching, dry skin Psych: Denies depression, anxiety, memory loss, and confusion Heme: Denies bruising, bleeding, and enlarged lymph nodes.   Physical Exam   BP 132/70   Pulse 73   Temp 98.1 F (36.7 C) (Oral)   Ht 6\' 2"  (1.88 m)   Wt (!) 348 lb (157.9 kg)   BMI 44.68 kg/m  General:   Alert and oriented. Pleasant and cooperative. Well-nourished and well-developed.  Head:  Normocephalic and atraumatic. Eyes:  Without icterus Abdomen:  +BS, soft, obese, small umbilical hernia, no TTP, difficult to appreciate HSM due to larger AP diameter, non-tense Rectal:  Deferred  Extremities:  With 1+ edema Neurologic:  Alert and  oriented x4; negative asterixis  Skin:  Intact without significant lesions or rashes. Psych:  Alert and cooperative. Normal mood and affect.   Assessment   68 y.o. male presenting today with a history of MASH cirrhosis complicated by anasarca and HE, asthma, anxiety, CHF, CKD, depression, diabetes, hypertension, hypothyroidism, not a liver transplant candidate, last seen 2024, recently in the ED with lower back pain and now presenting to GI.   Query musculoskeletal origin of lower back pain as  worsened with movement. Discussed we had  limited options here, but I have ordered a lumbar xray.   Regarding cirrhosis, his diuretic regimen has been optimized since last seen and currently on spironolactone 100 mg, torsemide 60 mg in am and 40 mg in pm, with improvement overall. Continues on lactulose and Xifaxan with no HE exacerbations.   Unfortunately, he is not a liver transplant candidate, and he had been on hospice last year; however, he has decided to be taken off of hospice and pursuing measures to treat what is treatable. I do note that he appears much improved from when I last saw him in July 2024 with significant anasarca.   Colonoscopy 2023 with cecal polyp unable to be removed and due for EGD for variceal screening but is not an anesthesia candidate. Conservative measures recommended.    PLAN    Lumbar xray Continue lactulose and Xifaxan Continue current diuretic regimen To ED if worsening lower back pain 3 month follow-up   Gelene Mink, PhD, ANP-BC [facility]   I have reviewed the note and agree with the APP's assessment as described in this progress note  Based on the previous evaluation by pulmonology, the patient was considered to be a moderate to high risk patient to undergo EGD and colonoscopy.  As his volume status has improved, we will discuss with him again the benefits versus cons of undergoing EGD and colonoscopy for variceal screening and attempted removal of colonic polyp.  If patient understands the risks and potential complications, and is willing to proceed, we will schedule him for this.  Katrinka Blazing, MD Gastroenterology and Hepatology [facility]

## 2023-04-29 ENCOUNTER — Ambulatory Visit (HOSPITAL_COMMUNITY)
Admission: RE | Admit: 2023-04-29 | Discharge: 2023-04-29 | Disposition: A | Discharge status: Home / Self Care | Payer: Medicare HMO | Source: Ambulatory Visit | Attending: Gastroenterology | Admitting: Gastroenterology

## 2023-04-29 DIAGNOSIS — Z9181 History of falling: Secondary | ICD-10-CM | POA: Diagnosis not present

## 2023-04-29 DIAGNOSIS — I5032 Chronic diastolic (congestive) heart failure: Secondary | ICD-10-CM | POA: Diagnosis not present

## 2023-04-29 DIAGNOSIS — E876 Hypokalemia: Secondary | ICD-10-CM | POA: Diagnosis not present

## 2023-04-29 DIAGNOSIS — M5126 Other intervertebral disc displacement, lumbar region: Secondary | ICD-10-CM | POA: Diagnosis not present

## 2023-04-29 DIAGNOSIS — D696 Thrombocytopenia, unspecified: Secondary | ICD-10-CM | POA: Diagnosis not present

## 2023-04-29 DIAGNOSIS — E871 Hypo-osmolality and hyponatremia: Secondary | ICD-10-CM | POA: Diagnosis not present

## 2023-04-29 DIAGNOSIS — R188 Other ascites: Secondary | ICD-10-CM | POA: Diagnosis not present

## 2023-04-29 DIAGNOSIS — M545 Low back pain, unspecified: Secondary | ICD-10-CM | POA: Diagnosis not present

## 2023-04-29 DIAGNOSIS — I130 Hypertensive heart and chronic kidney disease with heart failure and stage 1 through stage 4 chronic kidney disease, or unspecified chronic kidney disease: Secondary | ICD-10-CM | POA: Diagnosis not present

## 2023-04-29 DIAGNOSIS — Z7984 Long term (current) use of oral hypoglycemic drugs: Secondary | ICD-10-CM | POA: Diagnosis not present

## 2023-04-29 DIAGNOSIS — K7581 Nonalcoholic steatohepatitis (NASH): Secondary | ICD-10-CM | POA: Diagnosis not present

## 2023-04-29 DIAGNOSIS — E039 Hypothyroidism, unspecified: Secondary | ICD-10-CM | POA: Diagnosis not present

## 2023-04-29 DIAGNOSIS — J449 Chronic obstructive pulmonary disease, unspecified: Secondary | ICD-10-CM | POA: Diagnosis not present

## 2023-04-29 DIAGNOSIS — Z87891 Personal history of nicotine dependence: Secondary | ICD-10-CM | POA: Diagnosis not present

## 2023-04-29 DIAGNOSIS — D61818 Other pancytopenia: Secondary | ICD-10-CM | POA: Diagnosis not present

## 2023-04-29 DIAGNOSIS — F419 Anxiety disorder, unspecified: Secondary | ICD-10-CM | POA: Diagnosis not present

## 2023-04-29 DIAGNOSIS — M160 Bilateral primary osteoarthritis of hip: Secondary | ICD-10-CM | POA: Diagnosis not present

## 2023-04-29 DIAGNOSIS — E860 Dehydration: Secondary | ICD-10-CM | POA: Diagnosis not present

## 2023-04-29 DIAGNOSIS — D631 Anemia in chronic kidney disease: Secondary | ICD-10-CM | POA: Diagnosis not present

## 2023-04-29 DIAGNOSIS — E1165 Type 2 diabetes mellitus with hyperglycemia: Secondary | ICD-10-CM | POA: Diagnosis not present

## 2023-04-29 DIAGNOSIS — M5136 Other intervertebral disc degeneration, lumbar region with discogenic back pain only: Secondary | ICD-10-CM | POA: Diagnosis not present

## 2023-04-29 DIAGNOSIS — E1122 Type 2 diabetes mellitus with diabetic chronic kidney disease: Secondary | ICD-10-CM | POA: Diagnosis not present

## 2023-04-29 DIAGNOSIS — M48061 Spinal stenosis, lumbar region without neurogenic claudication: Secondary | ICD-10-CM | POA: Diagnosis not present

## 2023-04-29 DIAGNOSIS — E872 Acidosis, unspecified: Secondary | ICD-10-CM | POA: Diagnosis not present

## 2023-04-29 DIAGNOSIS — K7460 Unspecified cirrhosis of liver: Secondary | ICD-10-CM | POA: Diagnosis not present

## 2023-04-29 DIAGNOSIS — Z6841 Body Mass Index (BMI) 40.0 and over, adult: Secondary | ICD-10-CM | POA: Diagnosis not present

## 2023-04-29 DIAGNOSIS — F32A Depression, unspecified: Secondary | ICD-10-CM | POA: Diagnosis not present

## 2023-04-29 DIAGNOSIS — N1832 Chronic kidney disease, stage 3b: Secondary | ICD-10-CM | POA: Diagnosis not present

## 2023-05-03 ENCOUNTER — Telehealth (HOSPITAL_COMMUNITY): Payer: Self-pay

## 2023-05-03 NOTE — Telephone Encounter (Signed)
 Called to confirm/remind patient of their appointment at the [facility] on 05/06/23.   Patient reminded to bring all medications and/or complete list.  Confirmed patient has transportation. Gave directions, instructed to utilize valet parking.  Confirmed appointment prior to ending call.

## 2023-05-06 ENCOUNTER — Ambulatory Visit (HOSPITAL_COMMUNITY)
Admission: RE | Admit: 2023-05-06 | Discharge: 2023-05-06 | Disposition: A | Discharge status: Home / Self Care | Payer: Medicare HMO | Source: Ambulatory Visit | Attending: Adult Health | Admitting: Adult Health

## 2023-05-06 VITALS — BP 106/70 | HR 80 | Wt 353.0 lb

## 2023-05-06 DIAGNOSIS — N183 Chronic kidney disease, stage 3 unspecified: Secondary | ICD-10-CM | POA: Insufficient documentation

## 2023-05-06 DIAGNOSIS — E039 Hypothyroidism, unspecified: Secondary | ICD-10-CM | POA: Insufficient documentation

## 2023-05-06 DIAGNOSIS — K7581 Nonalcoholic steatohepatitis (NASH): Secondary | ICD-10-CM | POA: Diagnosis not present

## 2023-05-06 DIAGNOSIS — Z6841 Body Mass Index (BMI) 40.0 and over, adult: Secondary | ICD-10-CM | POA: Insufficient documentation

## 2023-05-06 DIAGNOSIS — D696 Thrombocytopenia, unspecified: Secondary | ICD-10-CM | POA: Diagnosis not present

## 2023-05-06 DIAGNOSIS — Z87891 Personal history of nicotine dependence: Secondary | ICD-10-CM | POA: Insufficient documentation

## 2023-05-06 DIAGNOSIS — E1122 Type 2 diabetes mellitus with diabetic chronic kidney disease: Secondary | ICD-10-CM | POA: Diagnosis not present

## 2023-05-06 DIAGNOSIS — E669 Obesity, unspecified: Secondary | ICD-10-CM | POA: Diagnosis not present

## 2023-05-06 DIAGNOSIS — J449 Chronic obstructive pulmonary disease, unspecified: Secondary | ICD-10-CM | POA: Insufficient documentation

## 2023-05-06 DIAGNOSIS — G4733 Obstructive sleep apnea (adult) (pediatric): Secondary | ICD-10-CM | POA: Diagnosis not present

## 2023-05-06 DIAGNOSIS — I5022 Chronic systolic (congestive) heart failure: Secondary | ICD-10-CM | POA: Diagnosis not present

## 2023-05-06 DIAGNOSIS — I5032 Chronic diastolic (congestive) heart failure: Secondary | ICD-10-CM | POA: Diagnosis not present

## 2023-05-06 DIAGNOSIS — I130 Hypertensive heart and chronic kidney disease with heart failure and stage 1 through stage 4 chronic kidney disease, or unspecified chronic kidney disease: Secondary | ICD-10-CM | POA: Insufficient documentation

## 2023-05-06 DIAGNOSIS — K7469 Other cirrhosis of liver: Secondary | ICD-10-CM | POA: Diagnosis not present

## 2023-05-06 DIAGNOSIS — Z79899 Other long term (current) drug therapy: Secondary | ICD-10-CM | POA: Insufficient documentation

## 2023-05-06 MED ORDER — TORSEMIDE 20 MG PO TABS
ORAL_TABLET | ORAL | Status: DC
Start: 2023-05-06 — End: 2023-06-28

## 2023-05-06 NOTE — Progress Notes (Addendum)
 ADVANCED [facility] CLINIC NOTE  PCP: [facility] Cardiology: Dr. Londa Rival HF Cardiology: Dr. Mitzie Anda  Chief Complaint: Heart Failure   HPI: 68 y.o. with history of NASH cirrhosis, chronic diastolic CHF, DM2, CKD stage 3, and COPD  Patient has cirrhosis due to NASH with splenomegaly, thrombocytopenia, and h/o hepatic encephalopathy.  Liver biopsy in 1/23 showed stage IV cirrhosis.  Patient has struggled with volume overload.   RHC in 10/23 showed mildly elevated filling pressures with pulmonary venous hypertension.  Echo in 4/24 showed EF 55%, mild LVH, normal RV systolic function with mild RV enlargement.    He was admitted twice in 7/24 for CHF and diuresed.  Cardiology was not consulted.  For reasons that remain obscure, he was taken off torsemide  and put back on Lasix .   Seen for follow-up on 11/28/22. Weight up about 20 lb after Torsemide  switched to lasix . He was restarted on torsemide  at 60 mg BID.   Seen back in F/u 12/20/22 and was still significantly volume overloaded.  Torsemide  increased to 60 mg bid . However, he wanted admission for diuresis but refused admission to [facility] and was adamant that he wanted to be admitted to [facility], closer to home. He drove there for admission and was started on IV Lasix  but notes outline that pt left prior to being fully diuresed, despite MD recommendation to stay for further diuresis. He was discharged still volume overloaded. D/c wt was 370 lb. He was discharged home on torsemide  80 mg bid + once weekly metolazone .   Since follow up in 10/24, he has had multiple hospitalizations for CHF exacerbation and encephalopathy. Most recently admitted 03/25/23-03/27/23, with complaint of weakness and noted to have a lactic acidosis. He was thought to be dehydrated and given IVF. He was restarted on Farxiga  and Torsemide  significantly decreased to 40 bid.   Today he returns for HF follow up.Complaining of fatigue and weakness. Says he gets worn out  from urinating so much and doesn't take afternoon diuretics. Says afternoon diuretics depend on how he is feeling. Drinks lots of fluids.  SOB with exertion. + Orthopnea. Appetite ok. No fever or chills. Weight at home 348 pounds. . Wants to be followed by Paramedics again.   PMH: 1. Cirrhosis: Due to NASH.  Splenomegaly, chronic thrombocytopenia, history of hepatic encephalopathy.  - Liver biopsy in 1/23 with stage IV cirrhosis.  2. Thrombocytopenia: Due to splenomegaly.  3. Hypothyroidism 4. HTN 5. Type 2 diabetes 6. CKD stage 3 7. COPD: Moderate obstruction on PFTs, prior smoker.  8. HFpEF: RHC (10/23) with mean RA 15, PA 36/20 mean 28, mean PCWP 19, PVR 1.2 WU, CI 2.65.  - Echo (4/24): EF 55%, mild LVH, normal RV systolic function with mild RV enlargement.  - Echo (10/24): EF 55-60, RV nl - RHC (11/24): RA 9, PA 25/17 (21), PCWP 10, CO/CI (fick) 13.6/5.01, PAPi 0.8  SH: Lives in [place], single, quit smoking 10 years ago, no ETOH.   Family History  Problem Relation Age of Onset   Liver cancer Mother    Heart disease Mother    Aneurysm Sister    ROS: All systems reviewed and negative except as per HPI.   Current Outpatient Medications  Medication Sig Dispense Refill   acetaminophen  (TYLENOL ) 325 MG tablet Take 2 tablets (650 mg total) by mouth every 6 (six) hours as needed for mild pain (pain score 1-3), fever or headache (or Fever >/= 101). 100 tablet 0   albuterol  (VENTOLIN  HFA) 108 (  90 Base) MCG/ACT inhaler Inhale 2 puffs into the lungs every 6 (six) hours as needed for wheezing or shortness of breath. 18 g 2   Cholecalciferol (VITAMIN D3) 1.25 MG (50000 UT) CAPS Take 1 capsule by mouth once a week.     dapagliflozin  propanediol (FARXIGA ) 10 MG TABS tablet Take 1 tablet (10 mg total) by mouth daily before breakfast. 90 tablet 3   diclofenac Sodium (VOLTAREN) 1 % GEL Apply 2 g topically in the morning and at bedtime. 1-2 inches topically twice daily     hydrOXYzine  (VISTARIL ) 50 MG  capsule Take 1 capsule (50 mg total) by mouth 3 (three) times daily as needed for anxiety or nausea. 90 capsule 2   lactose free nutrition (BOOST) LIQD Take 237 mLs by mouth every evening.     lactulose  (CHRONULAC ) 10 GM/15ML solution Take 45 mLs (30 g total) by mouth 3 (three) times daily. 473 mL 2   levothyroxine  (SYNTHROID ) 112 MCG tablet Take 1 tablet (112 mcg total) by mouth daily before breakfast. 30 tablet 3   ondansetron  (ZOFRAN ) 4 MG tablet Take 4 mg by mouth 2 (two) times daily as needed for nausea or vomiting.     pantoprazole  (PROTONIX ) 40 MG tablet Take 1 tablet (40 mg total) by mouth daily. 90 tablet 3   potassium chloride  SA (KLOR-CON  M) 20 MEQ tablet Take 2 tablets 40meq in the morning and 1 tablet 20meq  in the evening     rifaximin  (XIFAXAN ) 550 MG TABS tablet Take 1 tablet (550 mg total) by mouth 2 (two) times daily. 60 tablet 2   sertraline  (ZOLOFT ) 100 MG tablet Take 100 mg by mouth daily.     spironolactone  (ALDACTONE ) 100 MG tablet Take 1 tablet (100 mg total) by mouth daily. 90 tablet 2   tamsulosin  (FLOMAX ) 0.4 MG CAPS capsule Take 1 capsule (0.4 mg total) by mouth daily after supper.     torsemide  (DEMADEX ) 20 MG tablet Take 60mg  in the morning and 40mg  in the evening     traZODone  (DESYREL ) 50 MG tablet Take 1 tablet (50 mg total) by mouth at bedtime. 90 tablet 3   No current facility-administered medications for this encounter.   Wt Readings from Last 3 Encounters:  05/06/23 (!) 160.1 kg (353 lb)  04/25/23 (!) 157.9 kg (348 lb)  04/22/23 (!) 152.2 kg (335 lb 9.4 oz)   BP 106/70   Pulse 80   Wt (!) 160.1 kg (353 lb)   SpO2 97%   BMI 45.32 kg/m   PHYSICAL EXAM:  General:  . No resp difficulty HEENT: normal Neck: supple. JVP 11-12  Carotids 2+ bilat; no bruits.  Cor: PMI nondisplaced. Regular rate & rhythm. No rubs, gallops or murmurs. Lungs: clear Abdomen: soft, nontender, nondistended. No hepatosplenomegaly. No bruits or masses. Good bowel  sounds. Extremities: no cyanosis, clubbing, rash, RLE/LLE 1-2+ edema Neuro: alert & orientedx3, cranial nerves grossly intact. moves all 4 extremities w/o difficulty. Affect fat  Assessment/Plan: 1.  Chronic diastolic CHF: RHC in 10/23 with mildly elevated filling pressures and pulmonary venous hypertension.  Echo in 4/24 showed EF 55%, mild LVH, normal RV systolic function with mild RV enlargement.   NYHA III. Volume status elevated. I am not sure how he is taking diuretics. Suspect he Is drinking to much fluid. Discussed limiting fluid intake to < 2 liters.  - Instructed to take torsemide  80 mg in am and 40 mg in pm.  - Continue Spiro 100 mg daily - Continue  Farxiga  10 mg daily. No GU or skin s/s - He would be a reasonable Cardiomems candidate. Insurance had denied his appeal. 2. NASH cirrhosis: Stage IV by liver biopsy in 1/23.  Suspect due to NASH. He has h/o hepatic encephalopathy as well as splenomegaly with thrombocytopenia.  - Follows with GI in [place].  3. Thrombocytopenia: Due to cirrhosis/splenomegaly. Stable/chronic, 60K range. Other than bruising, denies any other spontaneous bleeding   4. COPD: No longer smokes.  Moderate obstruction on PFTs.  5. OSA: Mild on sleep study in 08/24 - Does not want to use CPAP 6. Hypothyroid: TSH 33 and T4 0.9 - On Synthroid  per PCP 7. Obesity: he expressed desire to go on a GLP1i  - not a good candidate with h/o pancreatitis - Discussed portion control    Follow up in 2-3 weeks to reassess volume status. He is at high risk for readmit. We called [facility] because he is agreeable to visits.    Nieves Bars, NP 05/06/2023

## 2023-05-06 NOTE — Patient Instructions (Signed)
 Medication Changes:  INCREASE Torsemide  to 80 mg (4 tabs) in AM and 40 mg (2 tabs) in PM   Referrals:  Please call Simonne Dubonnet your community paramedic to schedule a visit, [phone]  Special Instructions // Education:  Do the following things EVERYDAY: Weigh yourself in the morning before breakfast. Write it down and keep it in a log. Take your medicines as prescribed Eat low salt foods--Limit salt (sodium) to 2000 mg per day.  Stay as active as you can everyday Limit all fluids for the day to less than 2 liters   Follow-Up in: 3 weeks   At the [facility], you and your health needs are our priority. We have a designated team specialized in the treatment of Heart Failure. This Care Team includes your primary Heart Failure Specialized Cardiologist (physician), Advanced Practice Providers (APPs- Physician Assistants and Nurse Practitioners), and Pharmacist who all work together to provide you with the care you need, when you need it.   You [place] see any of the following providers on your designated Care Team at your next follow up:  Dr. Jules Oar Dr. Peder Bourdon Dr. Alwin Baars Dr. Judyth Nunnery Nieves Bars, NP Ruddy Corral, [place] [facility] [place], [place] Dennise Fitz, NP [place] Lee, NP Luster Salters, PharmD   Please be sure to bring in all your medications bottles to every appointment.   Need to Contact Us :  If you have any questions or concerns before your next appointment please send us  a message through [place] or call our office at [phone].    TO LEAVE A MESSAGE FOR THE NURSE SELECT OPTION 2, PLEASE LEAVE A MESSAGE INCLUDING: YOUR NAME DATE OF BIRTH CALL BACK NUMBER REASON FOR CALL**this is important as we prioritize the call backs  YOU WILL RECEIVE A CALL BACK THE SAME DAY AS LONG AS YOU CALL BEFORE 4:00 PM

## 2023-05-06 NOTE — Progress Notes (Signed)
 Called and spoke w/Elena [phone]) [facility] [employer], she states she last saw pt in Oct 2024, since then he has had excuses of why she couldn't see him, she is agreeable and happy to see him again if he will reach out to her and sch a date/time, provided pt with her phone number and he is agreeable to call.

## 2023-05-09 ENCOUNTER — Telehealth: Payer: Self-pay

## 2023-05-09 NOTE — Telephone Encounter (Signed)
Pt phoned wanting his Xray results from 2 weeks ago. I advised the pt that we are having to send stuff to [facility] to be read. Pt expressed understanding

## 2023-05-13 ENCOUNTER — Other Ambulatory Visit: Payer: Self-pay

## 2023-05-13 ENCOUNTER — Emergency Department (HOSPITAL_COMMUNITY): Payer: Medicare HMO

## 2023-05-13 ENCOUNTER — Encounter (HOSPITAL_COMMUNITY): Payer: Self-pay | Admitting: Emergency Medicine

## 2023-05-13 ENCOUNTER — Emergency Department (HOSPITAL_COMMUNITY)
Admission: EM | Admit: 2023-05-13 | Discharge: 2023-05-14 | Disposition: A | Discharge status: Skilled Nursing Facility | Payer: Medicare HMO | Attending: Emergency Medicine | Admitting: Emergency Medicine

## 2023-05-13 DIAGNOSIS — F418 Other specified anxiety disorders: Secondary | ICD-10-CM | POA: Diagnosis present

## 2023-05-13 DIAGNOSIS — S32030A Wedge compression fracture of third lumbar vertebra, initial encounter for closed fracture: Secondary | ICD-10-CM

## 2023-05-13 DIAGNOSIS — E8779 Other fluid overload: Secondary | ICD-10-CM

## 2023-05-13 DIAGNOSIS — N189 Chronic kidney disease, unspecified: Secondary | ICD-10-CM | POA: Insufficient documentation

## 2023-05-13 DIAGNOSIS — D509 Iron deficiency anemia, unspecified: Secondary | ICD-10-CM | POA: Diagnosis present

## 2023-05-13 DIAGNOSIS — R161 Splenomegaly, not elsewhere classified: Secondary | ICD-10-CM | POA: Diagnosis not present

## 2023-05-13 DIAGNOSIS — R0602 Shortness of breath: Secondary | ICD-10-CM | POA: Diagnosis not present

## 2023-05-13 DIAGNOSIS — N183 Chronic kidney disease, stage 3 unspecified: Secondary | ICD-10-CM | POA: Diagnosis present

## 2023-05-13 DIAGNOSIS — J449 Chronic obstructive pulmonary disease, unspecified: Secondary | ICD-10-CM | POA: Diagnosis not present

## 2023-05-13 DIAGNOSIS — R7989 Other specified abnormal findings of blood chemistry: Secondary | ICD-10-CM | POA: Diagnosis not present

## 2023-05-13 DIAGNOSIS — R079 Chest pain, unspecified: Secondary | ICD-10-CM | POA: Diagnosis not present

## 2023-05-13 DIAGNOSIS — R531 Weakness: Secondary | ICD-10-CM | POA: Diagnosis not present

## 2023-05-13 DIAGNOSIS — R4182 Altered mental status, unspecified: Secondary | ICD-10-CM | POA: Diagnosis not present

## 2023-05-13 DIAGNOSIS — M545 Low back pain, unspecified: Secondary | ICD-10-CM | POA: Diagnosis present

## 2023-05-13 DIAGNOSIS — K7460 Unspecified cirrhosis of liver: Secondary | ICD-10-CM | POA: Diagnosis not present

## 2023-05-13 DIAGNOSIS — R52 Pain, unspecified: Secondary | ICD-10-CM | POA: Diagnosis present

## 2023-05-13 DIAGNOSIS — I951 Orthostatic hypotension: Secondary | ICD-10-CM | POA: Diagnosis present

## 2023-05-13 DIAGNOSIS — G4733 Obstructive sleep apnea (adult) (pediatric): Secondary | ICD-10-CM | POA: Diagnosis present

## 2023-05-13 DIAGNOSIS — D696 Thrombocytopenia, unspecified: Secondary | ICD-10-CM | POA: Diagnosis present

## 2023-05-13 DIAGNOSIS — Z87898 Personal history of other specified conditions: Secondary | ICD-10-CM

## 2023-05-13 DIAGNOSIS — I509 Heart failure, unspecified: Secondary | ICD-10-CM | POA: Insufficient documentation

## 2023-05-13 DIAGNOSIS — E291 Testicular hypofunction: Secondary | ICD-10-CM | POA: Diagnosis present

## 2023-05-13 DIAGNOSIS — I5032 Chronic diastolic (congestive) heart failure: Secondary | ICD-10-CM | POA: Diagnosis present

## 2023-05-13 DIAGNOSIS — I5030 Unspecified diastolic (congestive) heart failure: Secondary | ICD-10-CM | POA: Diagnosis present

## 2023-05-13 DIAGNOSIS — E1122 Type 2 diabetes mellitus with diabetic chronic kidney disease: Secondary | ICD-10-CM | POA: Diagnosis not present

## 2023-05-13 DIAGNOSIS — I10 Essential (primary) hypertension: Secondary | ICD-10-CM | POA: Diagnosis not present

## 2023-05-13 DIAGNOSIS — M8448XA Pathological fracture, other site, initial encounter for fracture: Secondary | ICD-10-CM | POA: Insufficient documentation

## 2023-05-13 DIAGNOSIS — Z743 Need for continuous supervision: Secondary | ICD-10-CM | POA: Diagnosis not present

## 2023-05-13 DIAGNOSIS — R188 Other ascites: Secondary | ICD-10-CM | POA: Diagnosis not present

## 2023-05-13 DIAGNOSIS — N1831 Chronic kidney disease, stage 3a: Secondary | ICD-10-CM | POA: Diagnosis present

## 2023-05-13 DIAGNOSIS — K766 Portal hypertension: Secondary | ICD-10-CM | POA: Diagnosis not present

## 2023-05-13 DIAGNOSIS — K219 Gastro-esophageal reflux disease without esophagitis: Secondary | ICD-10-CM | POA: Diagnosis present

## 2023-05-13 DIAGNOSIS — E039 Hypothyroidism, unspecified: Secondary | ICD-10-CM | POA: Diagnosis present

## 2023-05-13 DIAGNOSIS — M25559 Pain in unspecified hip: Secondary | ICD-10-CM | POA: Diagnosis present

## 2023-05-13 DIAGNOSIS — E8770 Fluid overload, unspecified: Secondary | ICD-10-CM | POA: Diagnosis present

## 2023-05-13 LAB — URINALYSIS, W/ REFLEX TO CULTURE (INFECTION SUSPECTED)
Bacteria, UA: NONE SEEN
Bilirubin Urine: NEGATIVE
Glucose, UA: 50 mg/dL — AB
Ketones, ur: NEGATIVE mg/dL
Leukocytes,Ua: NEGATIVE
Nitrite: NEGATIVE
Protein, ur: NEGATIVE mg/dL
Specific Gravity, Urine: 1.005 (ref 1.005–1.030)
pH: 6 (ref 5.0–8.0)

## 2023-05-13 LAB — COOXEMETRY PANEL
Carboxyhemoglobin: 1.9 % — ABNORMAL HIGH (ref 0.5–1.5)
Methemoglobin: <0.7 % (ref 0.0–1.5)
O2 Saturation: 52.7 %
Total hemoglobin: 9.3 g/dL — ABNORMAL LOW (ref 12.0–16.0)

## 2023-05-13 LAB — CBC
HCT: 28.5 % — ABNORMAL LOW (ref 39.0–52.0)
Hemoglobin: 9.4 g/dL — ABNORMAL LOW (ref 13.0–17.0)
MCH: 28.4 pg (ref 26.0–34.0)
MCHC: 33 g/dL (ref 30.0–36.0)
MCV: 86.1 fL (ref 80.0–100.0)
Platelets: 60 10*3/uL — ABNORMAL LOW (ref 150–400)
RBC: 3.31 MIL/uL — ABNORMAL LOW (ref 4.22–5.81)
RDW: 19.1 % — ABNORMAL HIGH (ref 11.5–15.5)
WBC: 4.6 10*3/uL (ref 4.0–10.5)
nRBC: 0 % (ref 0.0–0.2)

## 2023-05-13 LAB — COMPREHENSIVE METABOLIC PANEL
ALT: 22 U/L (ref 0–44)
AST: 56 U/L — ABNORMAL HIGH (ref 15–41)
Albumin: 2.8 g/dL — ABNORMAL LOW (ref 3.5–5.0)
Alkaline Phosphatase: 90 U/L (ref 38–126)
Anion gap: 8 (ref 5–15)
BUN: 25 mg/dL — ABNORMAL HIGH (ref 8–23)
CO2: 26 mmol/L (ref 22–32)
Calcium: 8.8 mg/dL — ABNORMAL LOW (ref 8.9–10.3)
Chloride: 96 mmol/L — ABNORMAL LOW (ref 98–111)
Creatinine, Ser: 2.58 mg/dL — ABNORMAL HIGH (ref 0.61–1.24)
GFR, Estimated: 27 mL/min — ABNORMAL LOW (ref >60)
Glucose, Bld: 126 mg/dL — ABNORMAL HIGH (ref 70–99)
Potassium: 4 mmol/L (ref 3.5–5.1)
Sodium: 130 mmol/L — ABNORMAL LOW (ref 135–145)
Total Bilirubin: 5.5 mg/dL — ABNORMAL HIGH (ref 0.0–1.2)
Total Protein: 6.5 g/dL (ref 6.5–8.1)

## 2023-05-13 LAB — BRAIN NATRIURETIC PEPTIDE: B Natriuretic Peptide: 36 pg/mL (ref 0.0–100.0)

## 2023-05-13 LAB — RESP PANEL BY RT-PCR (RSV, FLU A&B, COVID)  RVPGX2
Influenza A by PCR: NEGATIVE
Influenza B by PCR: NEGATIVE
Resp Syncytial Virus by PCR: NEGATIVE
SARS Coronavirus 2 by RT PCR: NEGATIVE

## 2023-05-13 LAB — BLOOD GAS, VENOUS
Acid-Base Excess: 5.3 mmol/L — ABNORMAL HIGH (ref 0.0–2.0)
Bicarbonate: 31.1 mmol/L — ABNORMAL HIGH (ref 20.0–28.0)
Drawn by: 6509
O2 Saturation: 50.8 %
Patient temperature: 36.6
pCO2, Ven: 48 mm[Hg] (ref 44–60)
pH, Ven: 7.42 (ref 7.25–7.43)
pO2, Ven: <31 mm[Hg] — CL (ref 32–45)

## 2023-05-13 LAB — TSH: TSH: 76.531 u[IU]/mL — ABNORMAL HIGH (ref 0.350–4.500)

## 2023-05-13 LAB — T4, FREE: Free T4: 0.86 ng/dL (ref 0.61–1.12)

## 2023-05-13 LAB — AMMONIA: Ammonia: 30 umol/L (ref 9–35)

## 2023-05-13 MED ORDER — OXYCODONE-ACETAMINOPHEN 5-325 MG PO TABS
1.0000 | ORAL_TABLET | ORAL | Status: DC | PRN
  Administered 2023-05-14 (×2): 1 via ORAL
  Filled 2023-05-13 (×2): qty 1

## 2023-05-13 MED ORDER — LEVOTHYROXINE SODIUM 50 MCG PO TABS
200.0000 ug | ORAL_TABLET | Freq: Every day | ORAL | Status: DC
  Administered 2023-05-14: 200 ug via ORAL
  Filled 2023-05-13: qty 4

## 2023-05-13 MED ORDER — TAMSULOSIN HCL 0.4 MG PO CAPS
0.4000 mg | ORAL_CAPSULE | Freq: Every day | ORAL | Status: DC
  Administered 2023-05-13 – 2023-05-14 (×2): 0.4 mg via ORAL
  Filled 2023-05-13 (×2): qty 1

## 2023-05-13 MED ORDER — ALBUTEROL SULFATE HFA 108 (90 BASE) MCG/ACT IN AERS: 2.0000 | INHALATION_SPRAY | RESPIRATORY_TRACT | Status: DC | PRN

## 2023-05-13 MED ORDER — METHOCARBAMOL 500 MG PO TABS: 500.0000 mg | ORAL_TABLET | Freq: Once | ORAL | Status: DC

## 2023-05-13 MED ORDER — LIDOCAINE 5 % EX PTCH
1.0000 | MEDICATED_PATCH | CUTANEOUS | Status: DC
  Administered 2023-05-13 – 2023-05-14 (×2): 1 via TRANSDERMAL
  Filled 2023-05-13 (×2): qty 1

## 2023-05-13 MED ORDER — SPIRONOLACTONE 100 MG PO TABS
100.0000 mg | ORAL_TABLET | Freq: Every day | ORAL | Status: DC
  Administered 2023-05-13 – 2023-05-14 (×2): 100 mg via ORAL
  Filled 2023-05-13 (×2): qty 1

## 2023-05-13 MED ORDER — TORSEMIDE 20 MG PO TABS
40.0000 mg | ORAL_TABLET | Freq: Every evening | ORAL | Status: DC
  Filled 2023-05-13 (×2): qty 2

## 2023-05-13 MED ORDER — HYDROXYZINE PAMOATE 50 MG PO CAPS: 50.0000 mg | ORAL_CAPSULE | Freq: Three times a day (TID) | ORAL | Status: DC | PRN

## 2023-05-13 MED ORDER — RIFAXIMIN 550 MG PO TABS
550.0000 mg | ORAL_TABLET | Freq: Two times a day (BID) | ORAL | Status: DC
  Administered 2023-05-13 – 2023-05-14 (×4): 550 mg via ORAL
  Filled 2023-05-13 (×4): qty 1

## 2023-05-13 MED ORDER — PANTOPRAZOLE SODIUM 40 MG PO TBEC
40.0000 mg | DELAYED_RELEASE_TABLET | Freq: Every day | ORAL | Status: DC
  Administered 2023-05-13 – 2023-05-14 (×2): 40 mg via ORAL
  Filled 2023-05-13 (×2): qty 1

## 2023-05-13 MED ORDER — TORSEMIDE 20 MG PO TABS
80.0000 mg | ORAL_TABLET | Freq: Every morning | ORAL | Status: DC
  Administered 2023-05-13: 80 mg via ORAL
  Filled 2023-05-13 (×2): qty 4

## 2023-05-13 MED ORDER — SERTRALINE HCL 50 MG PO TABS
100.0000 mg | ORAL_TABLET | Freq: Every day | ORAL | Status: DC
  Administered 2023-05-13 – 2023-05-14 (×2): 100 mg via ORAL
  Filled 2023-05-13 (×2): qty 2

## 2023-05-13 MED ORDER — LACTULOSE 10 GM/15ML PO SOLN: 30.0000 g | Freq: Once | ORAL | Status: DC

## 2023-05-13 MED ORDER — OXYCODONE-ACETAMINOPHEN 5-325 MG PO TABS
1.0000 | ORAL_TABLET | Freq: Once | ORAL | Status: AC
  Administered 2023-05-13: 1 via ORAL
  Filled 2023-05-13: qty 1

## 2023-05-13 MED ORDER — LACTULOSE 10 GM/15ML PO SOLN
30.0000 g | Freq: Three times a day (TID) | ORAL | Status: DC
  Administered 2023-05-13: 30 g via ORAL
  Filled 2023-05-13 (×3): qty 60

## 2023-05-13 MED ORDER — RIFAXIMIN 550 MG PO TABS: 550.0000 mg | ORAL_TABLET | Freq: Once | ORAL | Status: DC

## 2023-05-13 MED ORDER — TRAZODONE HCL 50 MG PO TABS
50.0000 mg | ORAL_TABLET | Freq: Every day | ORAL | Status: DC
  Administered 2023-05-13 – 2023-05-14 (×2): 50 mg via ORAL
  Filled 2023-05-13 (×2): qty 1

## 2023-05-13 MED ORDER — RIFAXIMIN 550 MG PO TABS: 550.0000 mg | ORAL_TABLET | Freq: Two times a day (BID) | ORAL | Status: DC

## 2023-05-13 MED ORDER — LEVOTHYROXINE SODIUM 112 MCG PO TABS: 112.0000 ug | ORAL_TABLET | Freq: Every day | ORAL | Status: DC

## 2023-05-13 MED ORDER — METHOCARBAMOL 500 MG PO TABS
500.0000 mg | ORAL_TABLET | Freq: Three times a day (TID) | ORAL | Status: DC
  Administered 2023-05-13 – 2023-05-14 (×6): 500 mg via ORAL
  Filled 2023-05-13 (×6): qty 1

## 2023-05-13 MED ORDER — LEVOTHYROXINE SODIUM 112 MCG PO TABS
112.0000 ug | ORAL_TABLET | Freq: Every day | ORAL | Status: DC
  Filled 2023-05-13: qty 1

## 2023-05-13 MED ORDER — LACTULOSE 10 GM/15ML PO SOLN: 30.0000 g | Freq: Three times a day (TID) | ORAL | Status: DC

## 2023-05-13 NOTE — [place] FL2 (Signed)
[place] MEDICAID FL2 LEVEL OF CARE FORM     IDENTIFICATION  Patient Name: Daniel Crawford Birthdate: December 03, 1955 Sex: male Admission Date (Current Location): 05/13/2023  [facility] and [place] Number:  [employer] and Address:  [facility],  [address], [place] [address]      Provider Number: [id number]  Attending Physician Name and Address:  Gloris Manchester, MD  Relative Name and Phone Number:       Current Level of Care: Hospital Recommended Level of Care: Skilled Nursing Facility Prior Approval Number:    Date Approved/Denied:   PASRR Number: [id number]  Discharge Plan: SNF    Current Diagnoses: Patient Active Problem List   Diagnosis Date Noted   Elevated TSH 05/13/2023   Lumbar back pain 04/25/2023   Orthostatic hypotension 03/25/2023   Dehydration 03/25/2023   Lactic acidosis 03/25/2023   Hip pain 03/25/2023   Type 2 diabetes mellitus with hyperglycemia (HCC) 03/25/2023   Depression 03/25/2023   (HFpEF) heart failure with preserved ejection fraction (HCC) 03/25/2023   Obesity, Class III, BMI 40-49.9 (morbid obesity) (HCC) 03/11/2023   Hepatic encephalopathy (HCC) 02/01/2023   Acute kidney injury superimposed on stage 3b chronic kidney disease (HCC) 01/27/2023   COPD (chronic obstructive pulmonary disease) (HCC) 01/21/2023   Acute on chronic diastolic (congestive) heart failure (HCC) 01/21/2023   Cellulitis and abscess of left leg 10/23/2022   Chronic hyponatremia 10/21/2022   CKD stage 3a, GFR 45-59 ml/min (HCC) 07/06/2022   Umbilical hernia 07/05/2022   Obesity, class 3 05/25/2022   Hepatic cirrhosis (HCC) 03/13/2022   Iron deficiency anemia 02/28/2022   GERD (gastroesophageal reflux disease) 02/28/2022   Generalized weakness 01/23/2022   Essential hypertension 01/23/2022   Volume overload 01/23/2022   Protein calorie malnutrition (HCC) 01/22/2022   Chronic diastolic heart failure (HCC)    Hypogonadism, male 12/26/2021   History of  colonic polyps 07/27/2021   Auditory hallucinations 07/27/2021   Prolonged QT interval 04/19/2021   Hypokalemia 04/18/2021   Cellulitis of left leg 03/26/2021   OSA (obstructive sleep apnea) 01/20/2020   History of ascites 10/22/2019   Acquired hypothyroidism 10/16/2016   Depression with anxiety 10/16/2016   Thrombocytopenia (HCC) 10/16/2016   Spleen enlarged 10/16/2016   Class 3 severe obesity in adult [facility]) 02/28/2016    Orientation RESPIRATION BLADDER Height & Weight     Self, Time, Situation, Place  Normal Continent Weight: (!) 352 lb 11.8 oz (160 kg) Height:  6\' 2"  (188 cm)  BEHAVIORAL SYMPTOMS/MOOD NEUROLOGICAL BOWEL NUTRITION STATUS      Continent Diet (Regular)  AMBULATORY STATUS COMMUNICATION OF NEEDS Skin   Extensive Assist Verbally Normal                       Personal Care Assistance Level of Assistance  Bathing, Feeding, Dressing Bathing Assistance: Maximum assistance Feeding assistance: Independent Dressing Assistance: Maximum assistance     Functional Limitations Info  Hearing, Speech, Sight Sight Info: Adequate Hearing Info: Adequate Speech Info: Adequate    SPECIAL CARE FACTORS FREQUENCY  PT (By licensed PT), OT (By licensed OT)     PT Frequency: 5 x a week OT Frequency: 5 x a week            Contractures Contractures Info: Not present    Additional Factors Info  Code Status, Allergies, Psychotropic Code Status Info: FULL Allergies Info: NKA Psychotropic Info: Trazodone         Current Medications (05/13/2023):  This is  the current hospital active medication list Current Facility-Administered Medications  Medication Dose Route Frequency Provider Last Rate Last Admin   albuterol (VENTOLIN HFA) 108 (90 Base) MCG/ACT inhaler 2 puff  2 puff Inhalation Q2H PRN Gloris Manchester, MD       hydrOXYzine (VISTARIL) capsule 50 mg  50 mg Oral TID PRN Gloris Manchester, MD       lactulose (CHRONULAC) 10 GM/15ML solution 30 g  30 g Oral TID Gloris Manchester, MD    30 g at 05/13/23 1019   [START ON 05/14/2023] levothyroxine (SYNTHROID) tablet 200 mcg  200 mcg Oral QAC breakfast Johnson, Clanford L, MD       lidocaine (LIDODERM) 5 % 1 patch  1 patch Transdermal Q24H Gloris Manchester, MD   1 patch at 05/13/23 1019   methocarbamol (ROBAXIN) tablet 500 mg  500 mg Oral TID Gloris Manchester, MD   500 mg at 05/13/23 1018   oxyCODONE-acetaminophen (PERCOCET/ROXICET) 5-325 MG per tablet 1 tablet  1 tablet Oral Q4H PRN Gloris Manchester, MD       pantoprazole (PROTONIX) EC tablet 40 mg  40 mg Oral Daily Gloris Manchester, MD   40 mg at 05/13/23 1019   rifaximin (XIFAXAN) tablet 550 mg  550 mg Oral BID Gloris Manchester, MD   550 mg at 05/13/23 1018   sertraline (ZOLOFT) tablet 100 mg  100 mg Oral Daily Gloris Manchester, MD   100 mg at 05/13/23 1018   spironolactone (ALDACTONE) tablet 100 mg  100 mg Oral Daily Gloris Manchester, MD   100 mg at 05/13/23 1019   tamsulosin (FLOMAX) capsule 0.4 mg  0.4 mg Oral QPC supper Gloris Manchester, MD       torsemide [facility]) tablet 80 mg  80 mg Oral q morning Gloris Manchester, MD   80 mg at 05/13/23 1018   And   torsemide (DEMADEX) tablet 40 mg  40 mg Oral QPM Gloris Manchester, MD       traZODone (DESYREL) tablet 50 mg  50 mg Oral QHS Gloris Manchester, MD       Current Outpatient Medications  Medication Sig Dispense Refill   acetaminophen (TYLENOL) 325 MG tablet Take 2 tablets (650 mg total) by mouth every 6 (six) hours as needed for mild pain (pain score 1-3), fever or headache (or Fever >/= 101). 100 tablet 0   albuterol (VENTOLIN HFA) 108 (90 Base) MCG/ACT inhaler Inhale 2 puffs into the lungs every 6 (six) hours as needed for wheezing or shortness of breath. 18 g 2   Cholecalciferol (VITAMIN D3) 1.25 MG (50000 UT) CAPS Take 1 capsule by mouth once a week.     dapagliflozin propanediol (FARXIGA) 10 MG TABS tablet Take 1 tablet (10 mg total) by mouth daily before breakfast. 90 tablet 3   diclofenac Sodium (VOLTAREN) 1 % GEL Apply 2 g topically in the morning and at bedtime. 1-2 inches  topically twice daily     hydrOXYzine (VISTARIL) 50 MG capsule Take 1 capsule (50 mg total) by mouth 3 (three) times daily as needed for anxiety or nausea. 90 capsule 2   lactose free nutrition (BOOST) LIQD Take 237 mLs by mouth every evening.     lactulose (CHRONULAC) 10 GM/15ML solution Take 45 mLs (30 g total) by mouth 3 (three) times daily. 473 mL 2   levothyroxine (SYNTHROID) 112 MCG tablet Take 1 tablet (112 mcg total) by mouth daily before breakfast. 30 tablet 3   ondansetron (ZOFRAN) 4 MG tablet Take 4 mg by mouth  2 (two) times daily as needed for nausea or vomiting.     Oxycodone HCl 10 MG TABS 1 tablet as needed for pain Orally every 6 hrs for 5 days     pantoprazole (PROTONIX) 40 MG tablet Take 1 tablet (40 mg total) by mouth daily. 90 tablet 3   potassium chloride SA (KLOR-CON M) 20 MEQ tablet Take 2 tablets in the morning and 1 tablet  in the evening     rifaximin (XIFAXAN) 550 MG TABS tablet Take 1 tablet (550 mg total) by mouth 2 (two) times daily. 60 tablet 2   sertraline (ZOLOFT) 100 MG tablet Take 100 mg by mouth daily.     spironolactone (ALDACTONE) 100 MG tablet Take 1 tablet (100 mg total) by mouth daily. 90 tablet 2   tamsulosin (FLOMAX) 0.4 MG CAPS capsule Take 1 capsule (0.4 mg total) by mouth daily after supper.     torsemide (DEMADEX) 20 MG tablet Take 4 tablets (80 mg total) by mouth every morning AND 2 tablets (40 mg total) every evening.     traZODone (DESYREL) 50 MG tablet Take 1 tablet (50 mg total) by mouth at bedtime. 90 tablet 3     Discharge Medications: Please see discharge summary for a list of discharge medications.  Relevant Imaging Results:  Relevant Lab Results:   Additional Information SS: [id number]  Isabella Bowens, LCSWA

## 2023-05-13 NOTE — ED Provider Notes (Signed)
Patient stood up to urinate and fell on his face.  No loss of consciousness.  Patient has no complaints of pain.  Physical exam patient alert oriented x 4.  No trauma to his head no tenderness to his neck no tenderness to his chest no tenderness to his abdomen.  Patient moving extremities without pain.   Bethann Berkshire, MD 05/13/23 2050

## 2023-05-13 NOTE — ED Notes (Signed)
Pt was lying in bed with both side rails up, bed in lowest position, shoes on both feet in a hallway bed. This was an unwitnessed fall. Pt stated he was getting out of bed and tripped. Before fall at 2035, [employer] had spoke with pt and he denied needing to get up or use bathroom.   Pt is A&O and verbalized understanding to call for assistance prior to fall. Pt stated "I thought I could do it."

## 2023-05-13 NOTE — Hospital Course (Addendum)
68 year old male with stage IV Nash cirrhosis, chronic HFpEF, stage IIIb CKD, COPD, splenomegaly, GERD, chronic thrombocytopenia and history of hepatic encephalopathy, hypothyroidism, hypertension, class III obesity, type 2 diabetes mellitus, OSA who was brought in by EMS for generalized weakness and generalized pain.  He reportedly lives alone but reports he has been taking his medications as prescribed.  He woke up with generalized weakness this morning and reports that he was normal 2 days ago.  He reported some pain in his legs and reported fatigue and SOB.  He was evaluated in the ED had a chest x-ray CT head chest abdomen and pelvis.  Imaging positive for new superior endplate compression deformity of L3 without any other acute findings.  Persistent findings of cirrhotic liver and splenomegaly.  ED is planning to treat with pain management, TLSO brace and PT evaluation and social work/care management consultation for possible rehab facility placement.  Of note, some of his lab work demonstrated an elevated TSH of 76.5.  His other labs essentially stable.  His ammonia level is normal and no signs of hepatic encephalopathy.  Medical consultation was requested.

## 2023-05-13 NOTE — ED Triage Notes (Signed)
Pt with hx of Cirrhosis bib EMS for generalized weakness and generalized pain.

## 2023-05-13 NOTE — ED Notes (Signed)
Pt was placed back in bed with staff assistance. Pt is A&O x4 and currently talking to family on phone. There are no signs of active bleeding or open wounds. Pt has full ROM bilaterally in upper extremities. Pt continues to have limited ROM bilaterally in lower extremities as seen on initial assessment.

## 2023-05-13 NOTE — Evaluation (Signed)
Physical Therapy Evaluation Patient Details Name: Daniel Crawford MRN: [id number] DOB: 07/04/1955 Today's Date: 05/13/2023  History of Present Illness  Daniel Crawford is a 68 year old male with stage IV Nash cirrhosis, chronic HFpEF, stage IIIb CKD, COPD, splenomegaly, GERD, chronic thrombocytopenia and history of hepatic encephalopathy, hypothyroidism, hypertension, class III obesity, type 2 diabetes mellitus, OSA who was brought in by EMS for generalized weakness and generalized pain.  He reportedly lives alone but reports he has been taking his medications as prescribed.  He woke up with generalized weakness this morning and reports that he was normal 2 days ago.  He reported some pain in his legs and reported fatigue and SOB.  He was evaluated in the ED had a chest x-ray CT head chest abdomen and pelvis.  Imaging positive for new superior endplate compression deformity of L3 without any other acute findings.  Persistent findings of cirrhotic liver and splenomegaly.  ED is planning to treat with pain management, TLSO brace and PT evaluation and social work/care management consultation for possible rehab facility placement.  Of note, some of his lab work demonstrated an elevated TSH of 76.5.  His other labs essentially stable.  His ammonia level is normal and no signs of hepatic encephalopathy.  Medical consultation was requested.   Clinical Impression  Patient was agreeable to therapy. Patient performed all tasks with mod assist. Initial sit to stand and bed to chair transfer was performed with cane. Patient was unstable so RW was provided to increase balance during ambulation. Ambulation was limited due to fatigue and LLE pain. Patient was left in chair at conclusion of session. Patient will benefit from continued skilled physical therapy in hospital and recommended venue below to increase strength, balance, endurance for safe ADLs and gait.  ]        If plan is discharge home, recommend the following: A lot of  help with walking and/or transfers;A lot of help with bathing/dressing/bathroom;Assistance with cooking/housework;Help with stairs or ramp for entrance   Can travel by private vehicle        Equipment Recommendations None recommended by PT  Recommendations for Other Services       Functional Status Assessment Patient has had a recent decline in their functional status and demonstrates the ability to make significant improvements in function in a reasonable and predictable amount of time.     Precautions / Restrictions Precautions Precautions: Fall Restrictions Weight Bearing Restrictions Per Provider Order: No      Mobility  Bed Mobility Overal bed mobility: Needs Assistance Bed Mobility: Supine to Sit     Supine to sit: Mod assist     General bed mobility comments: slow movements, uses cane to complete sit portion    Transfers Overall transfer level: Needs assistance Equipment used: Straight cane Transfers: Sit to/from Stand, Bed to chair/wheelchair/BSC Sit to Stand: Mod assist   Step pivot transfers: Mod assist            Ambulation/Gait Ambulation/Gait assistance: Mod assist Gait Distance (Feet): 10 Feet Assistive device: Rolling walker (2 wheels) Gait Pattern/deviations: Step-to pattern, Decreased step length - right, Decreased step length - left Gait velocity: slow     General Gait Details: slow, labored movments, RW was provided to increase balance stability  Stairs            Wheelchair Mobility     Tilt Bed    Modified Rankin (Stroke Patients Only)       Balance Overall balance assessment: Needs assistance Sitting-balance support:  Bilateral upper extremity supported, Feet supported Sitting balance-Leahy Scale: Poor Sitting balance - Comments: poor/fair   Standing balance support: Bilateral upper extremity supported, During functional activity, Reliant on assistive device for balance Standing balance-Leahy Scale: Poor Standing  balance comment: poor/fair                             Pertinent Vitals/Pain Pain Assessment Pain Assessment: Faces Faces Pain Scale: Hurts even more Pain Location: LLE Pain Descriptors / Indicators: Discomfort, Grimacing, Guarding Pain Intervention(s): Limited activity within patient's tolerance, Monitored during session, Repositioned    Home Living Family/patient expects to be discharged to:: Private residence Living Arrangements: Alone Available Help at Discharge: Friend(s);Available PRN/intermittently Type of Home: Apartment Home Access: Level entry       Home Layout: One level Home Equipment: Rolling Walker (2 wheels);Grab bars - tub/shower;Cane - quad;Shower [employer] operational officer (4 wheels) Additional Comments: pt reports he has life alert as well    Prior Function Prior Level of Function : Independent/Modified Independent;History of Falls (last six months)             Mobility Comments: Leans on walls within the home; quad cane or rollator for longer distances ADLs Comments: Independent     Extremity/Trunk Assessment   Upper Extremity Assessment Upper Extremity Assessment: Defer to OT evaluation    Lower Extremity Assessment Lower Extremity Assessment: Generalized weakness    Cervical / Trunk Assessment Cervical / Trunk Assessment: Normal  Communication   Communication Communication: No apparent difficulties    Cognition Arousal: Alert Behavior During Therapy: WFL for tasks assessed/performed   PT - Cognitive impairments: No apparent impairments                         Following commands: Intact       Cueing Cueing Techniques: Verbal cues, Tactile cues     General Comments      Exercises     Assessment/Plan    PT Assessment Patient needs continued PT services  PT Problem List Decreased strength;Decreased activity tolerance;Decreased balance;Decreased mobility       PT Treatment Interventions DME  instruction;Gait training;Stair training;Functional mobility training;Therapeutic activities;Therapeutic exercise;Balance training;Patient/family education    PT Goals (Current goals can be found in the Care Plan section)  Acute Rehab PT Goals Patient Stated Goal: to return home after rehab PT Goal Formulation: With patient Time For Goal Achievement: 05/27/23 Potential to Achieve Goals: Good    Frequency Min 2X/week     Co-evaluation               AM-PAC PT "6 Clicks" Mobility  Outcome Measure Help needed turning from your back to your side while in a flat bed without using bedrails?: A Lot Help needed moving from lying on your back to sitting on the side of a flat bed without using bedrails?: A Lot Help needed moving to and from a bed to a chair (including a wheelchair)?: A Lot Help needed standing up from a chair using your arms (e.g., wheelchair or bedside chair)?: A Lot Help needed to walk in hospital room?: A Lot Help needed climbing 3-5 steps with a railing? : Total 6 Click Score: 11    End of Session   Activity Tolerance: Patient tolerated treatment well;Patient limited by fatigue;Patient limited by pain Patient left: in chair;with call bell/phone within reach Nurse Communication: Mobility status PT Visit Diagnosis: Unsteadiness on feet (R26.81);Other abnormalities of gait  and mobility (R26.89);Muscle weakness (generalized) (M62.81)    Time: 1308-6578 PT Time Calculation (min) (ACUTE ONLY): 26 min   Charges:   PT Evaluation $PT Eval Moderate Complexity: 1 Mod PT Treatments $Therapeutic Activity: 23-37 mins PT General Charges $$ ACUTE PT VISIT: 1 Visit         Khloe Hunkele SPT

## 2023-05-13 NOTE — ED Provider Notes (Addendum)
[place] EMERGENCY DEPARTMENT AT [facility] Provider Note   CSN: [id number] Arrival date & time: 05/13/23  0559     History  Chief Complaint  Patient presents with   Weakness   Generalized Pain   Shortness of Breath    Daniel Crawford is a 68 y.o. male.   Weakness Associated symptoms: shortness of breath   Shortness of Breath Patient presents for generalized weakness.  Medical history includes anxiety, depression, anemia, COPD, GERD, CHF, OSA, hypothyroidism, cirrhosis, CKD, DM.  He states that he has been adherent to his home medications.  2 days ago, he was in his normal state of health.  Yesterday morning, he woke up with generalized weakness.  It was difficult for him to even get out of the bed.  He did not get up to make any food until the afternoon.  Weakness has been persistent.  He did not get his lactulose yesterday.  He states that the swelling and pain is in his legs is slightly worse than baseline.  He endorses fatigue and shortness of breath.     Home Medications Prior to Admission medications   Medication Sig Start Date End Date Taking? Authorizing Provider  acetaminophen (TYLENOL) 325 MG tablet Take 2 tablets (650 mg total) by mouth every 6 (six) hours as needed for mild pain (pain score 1-3), fever or headache (or Fever >/= 101). 02/08/23   Shon Hale, MD  albuterol (VENTOLIN HFA) 108 (90 Base) MCG/ACT inhaler Inhale 2 puffs into the lungs every 6 (six) hours as needed for wheezing or shortness of breath. 02/08/23   Shon Hale, MD  Cholecalciferol (VITAMIN D3) 1.25 MG (50000 UT) CAPS Take 1 capsule by mouth once a week. 03/06/23   [provider]  dapagliflozin propanediol (FARXIGA) 10 MG TABS tablet Take 1 tablet (10 mg total) by mouth daily before breakfast. 02/08/23   Shon Hale, MD  diclofenac Sodium (VOLTAREN) 1 % GEL Apply 2 g topically in the morning and at bedtime. 1-2 inches topically twice daily 03/06/23   [provider]  hydrOXYzine (VISTARIL) 50 MG capsule Take 1 capsule (50 mg total) by mouth 3 (three) times daily as needed for anxiety or nausea. 02/08/23   Shon Hale, MD  lactose free nutrition (BOOST) LIQD Take 237 mLs by mouth every evening.    [provider]  lactulose (CHRONULAC) 10 GM/15ML solution Take 45 mLs (30 g total) by mouth 3 (three) times daily. 02/08/23   Shon Hale, MD  levothyroxine (SYNTHROID) 112 MCG tablet Take 1 tablet (112 mcg total) by mouth daily before breakfast. 03/27/23   Vassie Loll, MD  ondansetron (ZOFRAN) 4 MG tablet Take 4 mg by mouth 2 (two) times daily as needed for nausea or vomiting. 10/28/22   [provider]  pantoprazole (PROTONIX) 40 MG tablet Take 1 tablet (40 mg total) by mouth daily. 01/25/22   Shon Hale, MD  potassium chloride SA (KLOR-CON M) 20 MEQ tablet Take 2 tablets in the morning and 1 tablet  in the evening 04/05/23   Lee, [place], NP  rifaximin (XIFAXAN) 550 MG TABS tablet Take 1 tablet (550 mg total) by mouth 2 (two) times daily. 02/08/23   Shon Hale, MD  sertraline (ZOLOFT) 100 MG tablet Take 100 mg by mouth daily. 09/24/22   [provider]  spironolactone (ALDACTONE) 100 MG tablet Take 1 tablet (100 mg total) by mouth daily. 02/08/23   Shon Hale, MD  tamsulosin (FLOMAX) 0.4 MG CAPS capsule  Take 1 capsule (0.4 mg total) by mouth daily after supper. 03/27/23   Vassie Loll, MD  torsemide (DEMADEX) 20 MG tablet Take 4 tablets (80 mg total) by mouth every morning AND 2 tablets (40 mg total) every evening. 05/06/23   Clegg, Amy D, NP  traZODone (DESYREL) 50 MG tablet Take 1 tablet (50 mg total) by mouth at bedtime. 02/08/23   Shon Hale, MD      Allergies    Patient has no known allergies.    Review of Systems   Review of Systems  Constitutional:  Positive for activity change and fatigue.  Respiratory:  Positive for shortness of breath.   Cardiovascular:  Positive for  leg swelling.  Neurological:  Positive for weakness (Generalized).  All other systems reviewed and are negative.   Physical Exam Updated Vital Signs BP 119/82   Pulse 72   Temp 97.8 F (36.6 C) (Oral)   Resp 16   Ht 6\' 2"  (1.88 m)   Wt (!) 160 kg   SpO2 98%   BMI 45.29 kg/m  Physical Exam Vitals and nursing note reviewed.  Constitutional:      General: He is not in acute distress.    Appearance: He is well-developed. He is ill-appearing (Chronically). He is not toxic-appearing or diaphoretic.  HENT:     Head: Normocephalic and atraumatic.     Mouth/Throat:     Comments: Angular cheilitis Eyes:     Conjunctiva/sclera: Conjunctivae normal.  Cardiovascular:     Rate and Rhythm: Normal rate and regular rhythm.     Heart sounds: No murmur heard. Pulmonary:     Effort: Pulmonary effort is normal. No tachypnea or respiratory distress.     Breath sounds: Normal breath sounds. No decreased breath sounds, wheezing or rhonchi.  Chest:     Chest wall: No tenderness.  Abdominal:     Palpations: Abdomen is soft.     Tenderness: There is no abdominal tenderness.  Musculoskeletal:        General: No swelling.     Cervical back: Neck supple.     Right lower leg: Edema present.     Left lower leg: Edema present.  Skin:    General: Skin is warm and dry.     Coloration: Skin is not cyanotic or pale.  Neurological:     General: No focal deficit present.     Mental Status: He is alert and oriented to person, place, and time.     Comments: Asterixis is present  Psychiatric:        Mood and Affect: Mood normal.        Behavior: Behavior normal.     ED Results / Procedures / Treatments   Labs (all labs ordered are listed, but only abnormal results are displayed) Labs Reviewed  CBC - Abnormal; Notable for the following components:      Result Value   RBC 3.31 (*)    Hemoglobin 9.4 (*)    HCT 28.5 (*)    RDW 19.1 (*)    Platelets 60 (*)    All other components within normal  limits  COMPREHENSIVE METABOLIC PANEL - Abnormal; Notable for the following components:   Sodium 130 (*)    Chloride 96 (*)    Glucose, Bld 126 (*)    BUN 25 (*)    Creatinine, Ser 2.58 (*)    Calcium 8.8 (*)    Albumin 2.8 (*)    AST 56 (*)    Total  Bilirubin 5.5 (*)    GFR, Estimated 27 (*)    All other components within normal limits  URINALYSIS, W/ REFLEX TO CULTURE (INFECTION SUSPECTED) - Abnormal; Notable for the following components:   Glucose, UA 50 (*)    Hgb urine dipstick SMALL (*)    All other components within normal limits  BLOOD GAS, VENOUS - Abnormal; Notable for the following components:   pO2, Ven <31 (*)    Bicarbonate 31.1 (*)    Acid-Base Excess 5.3 (*)    All other components within normal limits  COOXEMETRY PANEL - Abnormal; Notable for the following components:   Total hemoglobin 9.3 (*)    Carboxyhemoglobin 1.9 (*)    All other components within normal limits  RESP PANEL BY RT-PCR (RSV, FLU A&B, COVID)  RVPGX2  AMMONIA  BRAIN NATRIURETIC PEPTIDE  TSH    EKG EKG Interpretation Date/Time:  Monday May 13 2023 06:11:14 EST Ventricular Rate:  78 PR Interval:  222 QRS Duration:  103 QT Interval:  435 QTC Calculation: 466 R Axis:   32  Text Interpretation: Sinus rhythm Paired ventricular premature complexes Borderline prolonged PR interval Low voltage, precordial leads Confirmed by Ross Marcus ([id number]) on 05/13/2023 6:49:43 AM  Radiology CT CHEST ABDOMEN PELVIS WO CONTRAST Result Date: 05/13/2023 CLINICAL DATA:  Unintended weight loss. Generalized abdominal pain, back pain. Weakness. EXAM: CT CHEST, ABDOMEN AND PELVIS WITHOUT CONTRAST TECHNIQUE: Multidetector CT imaging of the chest, abdomen and pelvis was performed following the standard protocol without IV contrast. RADIATION DOSE REDUCTION: This exam was performed according to the departmental dose-optimization program which includes automated exposure control, adjustment of the mA and/or kV  according to patient size and/or use of iterative reconstruction technique. COMPARISON:  CT scan renal stone protocol from 04/23/2023 and CT angiography chest from 01/22/2022. FINDINGS: CT CHEST FINDINGS Cardiovascular: Normal cardiac size. No pericardial effusion. No aortic aneurysm. Mediastinum/Nodes: Visualized thyroid gland appears grossly unremarkable. No solid / cystic mediastinal masses. The esophagus is nondistended precluding optimal assessment. No mediastinal or axillary lymphadenopathy by size criteria. Evaluation of bilateral hila is limited due to lack on intravenous contrast: however, no large hilar lymphadenopathy identified. Lungs/Pleura: The central tracheo-bronchial tree is patent. No mass or consolidation. No pleural effusion or pneumothorax. No suspicious lung nodules. Musculoskeletal: The visualized soft tissues of the chest wall are grossly unremarkable. No suspicious osseous lesions. There are mild multilevel degenerative changes in the visualized spine. CT ABDOMEN PELVIS FINDINGS Hepatobiliary: The liver is normal in size. There is liver surface irregularity/nodularity, compatible with cirrhosis. No discrete focal mass seen on this unenhanced exam. No intrahepatic or extrahepatic bile duct dilation. Gallbladder is surgically absent. Pancreas: Small/atrophic pancreas.  Not well evaluated on this exam. Spleen: Moderate to markedly enlarged and lobulated spleen measuring up to 13.3 x 20.8 cm orthogonally on coronal plane. No focal lesion seen within the limitations of this unenhanced exam. Adrenals/Urinary Tract: Adrenal glands are unremarkable. No suspicious renal mass within the limitations of this unenhanced exam. There is a 4 mm nonobstructing calculus in the right kidney lower pole calyx and a 1-2 mm nonobstructing calculus in the left kidney interpolar region. No ureterolithiasis or obstructive uropathy on either side. Unremarkable urinary bladder. Stomach/Bowel: No disproportionate  dilation of the small or large bowel loops. No evidence of abnormal bowel wall thickening or inflammatory changes. The appendix is unremarkable. Vascular/Lymphatic: There is mild ascites mainly in the perihepatic/perisplenic region. No pneumoperitoneum. There is also fat stranding mainly concentrated around the mesenteric vessels which is  also likely secondary to ascites. No abdominal or pelvic lymphadenopathy, by size criteria. No aneurysmal dilation of the major abdominal arteries. Note is made of prominent venous collaterals near the lower thoracic esophagus, GE junction and in the left upper quadrant, not well evaluated on this exam but favored to represent sequela of portal hypertension. Reproductive: Normal size prostate. Symmetric seminal vesicles. Other: Redemonstration of small-to-moderate periumbilical hernia containing fat and trace amount of ascitic fluid. There are metallic anchors in the left inguinal region, from prior hernia repair. However there is still small sized fat containing left inguinal hernia. There is moderate anasarca. Musculoskeletal: No suspicious osseous lesions. There are mild - moderate multilevel degenerative changes in the visualized spine. There is new superior endplate compression deformity of L3 vertebral body, when compared to the prior exam from 04/23/2023. No significant retropulsion or spinal canal compromise. IMPRESSION: 1. No discrete neoplastic process identified within the chest, abdomen or pelvis on this unenhanced exam. 2. There is new superior endplate compression deformity of L3 vertebral body, when compared to the prior exam from 04/23/2023. No significant retropulsion or spinal canal compromise. 3. No lung mass, consolidation, pleural effusion or pneumothorax. 4. Cirrhotic liver with sequela of portal hypertension characterized by mild ascites, splenomegaly and venous collaterals. 5. Multiple other nonacute observations, as described above. Electronically Signed    By: Jules Schick M.D.   On: 05/13/2023 09:37   CT Head Wo Contrast Result Date: 05/13/2023 CLINICAL DATA:  Mental status change with unknown cause. EXAM: CT HEAD WITHOUT CONTRAST TECHNIQUE: Contiguous axial images were obtained from the base of the skull through the vertex without intravenous contrast. RADIATION DOSE REDUCTION: This exam was performed according to the departmental dose-optimization program which includes automated exposure control, adjustment of the mA and/or kV according to patient size and/or use of iterative reconstruction technique. COMPARISON:  02/01/2023 FINDINGS: Brain: No evidence of acute infarction, hemorrhage, hydrocephalus, extra-axial collection or mass lesion/mass effect. Vascular: No hyperdense vessel or unexpected calcification. Skull: Normal. Negative for fracture or focal lesion. Sinuses/Orbits: No acute finding. IMPRESSION: Stable, negative head CT. Electronically Signed   By: Tiburcio Pea M.D.   On: 05/13/2023 09:19   DG Chest Portable 1 View Result Date: 05/13/2023 CLINICAL DATA:  Generalized weakness and pain EXAM: PORTABLE CHEST 1 VIEW COMPARISON:  03/25/2023 FINDINGS: Stable generous heart size. Normal aortic and hilar contours. There is no edema, consolidation, effusion, or pneumothorax. IMPRESSION: Stable exam.  No acute finding. Electronically Signed   By: Tiburcio Pea M.D.   On: 05/13/2023 07:03    Procedures Procedures    Medications Ordered in ED Medications  albuterol (VENTOLIN HFA) 108 (90 Base) MCG/ACT inhaler 2 puff (has no administration in time range)  lidocaine (LIDODERM) 5 % 1 patch (has no administration in time range)  hydrOXYzine (VISTARIL) capsule 50 mg (has no administration in time range)  levothyroxine (SYNTHROID) tablet 112 mcg (has no administration in time range)  pantoprazole (PROTONIX) EC tablet 40 mg (has no administration in time range)  sertraline (ZOLOFT) tablet 100 mg (has no administration in time range)   spironolactone (ALDACTONE) tablet 100 mg (has no administration in time range)  tamsulosin (FLOMAX) capsule 0.4 mg (has no administration in time range)  torsemide (DEMADEX) tablet 80 mg (has no administration in time range)    And  torsemide (DEMADEX) tablet 40 mg (has no administration in time range)  traZODone (DESYREL) tablet 50 mg (has no administration in time range)  lactulose (CHRONULAC) 10 GM/15ML solution 30 g (has  no administration in time range)  rifaximin (XIFAXAN) tablet 550 mg (has no administration in time range)  oxyCODONE-acetaminophen (PERCOCET/ROXICET) 5-325 MG per tablet 1 tablet (has no administration in time range)  methocarbamol (ROBAXIN) tablet 500 mg (has no administration in time range)  oxyCODONE-acetaminophen (PERCOCET/ROXICET) 5-325 MG per tablet 1 tablet (1 tablet Oral Given 05/13/23 0844)    ED Course/ Medical Decision Making/ A&P                                 Medical Decision Making Amount and/or Complexity of Data Reviewed Labs: ordered. Radiology: ordered.  Risk Prescription drug management.   This patient presents to the ED for concern of generalized weakness, this involves an extensive number of treatment options, and is a complaint that carries with it a high risk of complications and morbidity.  The differential diagnosis includes polypharmacy, medication nonadherence, deconditioning, infection, metabolic derangements, CHF exacerbation   Co morbidities that complicate the patient evaluation  anxiety, depression, anemia, COPD, GERD, CHF, OSA, hypothyroidism, cirrhosis, CKD, DM   Additional history obtained:  Additional history obtained from N/A External records from outside source obtained and reviewed including EMR   Lab Tests:  I Ordered, and personally interpreted labs.  The pertinent results include: Baseline anemia, no leukocytosis, baseline thrombocytopenia, baseline creatinine, normal BNP, normal ammonia.  No evidence of  UTI.   Imaging Studies ordered:  I ordered imaging studies including chest x-ray, CT of head, chest, abdomen, pelvis I independently visualized and interpreted imaging which showed new superior endplate compression deformity of L3 without any other acute findings.  Redemonstration of cirrhotic liver, splenomegaly. I agree with the radiologist interpretation   Cardiac Monitoring: / EKG:  The patient was maintained on a cardiac monitor.  I personally viewed and interpreted the cardiac monitored which showed an underlying rhythm of: Sinus rhythm  Problem List / ED Course / Critical interventions / Medication management  Patient presenting for generalized weakness.  This has been present over the past 24 hours.  On arrival in the ED, vital signs are notable for mild tachycardia.  On exam, patient's breathing is unlabored.  He is able to speak in complete sentences.  He does appear somewhat somnolent.  Although he has no focal deficits, he does have asterixis on exam.  He states that he was taking his lactulose up until yesterday.  Initial lab work shows baseline CKD, baseline anemia.  Bilirubin slightly increased from baseline.  Despite what appear to be asterixis on exam, patient has normal ammonia.  Lab work is essentially unremarkable.  He underwent CT imaging which did show what appears to be a new L3 superior endplate compression deformity.  When asked about recent falls, he does state that he fell yesterday about that he was a mild mechanism.  He does have point tenderness to area of L3.  Given his habitus, bracing [place] be difficult ordered.  TLSO brace was ordered.  He has poor mobility at baseline.  He states that he lives alone but does have friends that check on him.  He was previously undergoing physical therapy at home but physical therapists time ran out several weeks ago.  Patient to undergo PT evaluation here in the ED.  Will consult social work for possible rehab facility placement.   Patient to remain in the ED pending these evaluations.  Home medications were ordered.  Further lab work shows markedly elevated TSH.  Patient states that  he has been taking his Synthroid.  T3 and T4 levels were ordered.  Hypothyroidism Olson be causing this weakness.  Will reach out to hospitalist for consideration of admission.  Hospitalist evaluated and did put in for increased dose of Synthroid.  No recommendation for hospitalization at this time.  Plan will be for PT evaluation and social work consult. I ordered medication including lidocaine patch, Percocet and Robaxin for analgesia; home medications Reevaluation of the patient after these medicines showed that the patient improved I have reviewed the patients home medicines and have made adjustments as needed   Social Determinants of Health:  Lives alone        Final Clinical Impression(s) / ED Diagnoses Final diagnoses:  Closed compression fracture of L3 lumbar vertebra, initial encounter (HCC)  Generalized weakness    Rx / DC Orders ED Discharge Orders     None         Gloris Manchester, MD 05/13/23 1006    Gloris Manchester, MD 05/13/23 1656

## 2023-05-13 NOTE — ED Notes (Signed)
Transition of Care [facility]) - Emergency Department Mini Assessment   Patient Details  Name: Daniel Crawford MRN: [id number] Date of Birth: April 25, 1955  Transition of Care [facility]) CM/SW Contact:    Isabella Bowens, LCSWA Phone Number: 05/13/2023, 3:31 PM   Clinical Narrative: CSW called patient HCPOA #1 Brett Canales and got some information about pt. Afterwards CSW spoke with patient at bedside about his home set up and services. Pt receives [facility] services through [place] and stated that if he goes home with [facility] he wanted to continue with [facility]. CSW did ask pt if SNF was recommended would he be open to going and pt said yes for his referral to be sent to [facility] . PT did recommend SNF . CSW will work on sending pt out.    ED Mini Assessment: What brought you to the Emergency Department? : Weakness ,Generalized Pain and Shortness of Breath  Barriers to Discharge:  (Waiting for PT recommendation to discuss with patient and family)  Barrier interventions: Waiting to see PT recommendation  Means of departure: Ambulance  Interventions which prevented an admission or readmission: Home Health Consult or Services, SNF Placement    Patient Contact and Communications Key Contact 1: Alain Honey Contact 2: Patient Spoke with: Brett Canales and patient Contact Date: 05/13/23,   Contact time: 1529 Contact Phone Number: [phone] Call outcome: Consult for PT and based on recommendation , pt will go home with [facility] or DC to a SNF  Patient states their goals for this hospitalization and ongoing recovery are:: Get back to myself CMS Medicare.gov Compare Post Acute Care list provided to:: Patient Choice offered to / list presented to : Patient  Admission diagnosis:  Weakness Patient Active Problem List   Diagnosis Date Noted   Elevated TSH 05/13/2023   Lumbar back pain 04/25/2023   Orthostatic hypotension 03/25/2023   Dehydration 03/25/2023   Lactic acidosis 03/25/2023   Hip pain 03/25/2023   Type 2 diabetes  mellitus with hyperglycemia (HCC) 03/25/2023   Depression 03/25/2023   (HFpEF) heart failure with preserved ejection fraction (HCC) 03/25/2023   Obesity, Class III, BMI 40-49.9 (morbid obesity) (HCC) 03/11/2023   Hepatic encephalopathy (HCC) 02/01/2023   Acute kidney injury superimposed on stage 3b chronic kidney disease (HCC) 01/27/2023   COPD (chronic obstructive pulmonary disease) (HCC) 01/21/2023   Acute on chronic diastolic (congestive) heart failure (HCC) 01/21/2023   Cellulitis and abscess of left leg 10/23/2022   Chronic hyponatremia 10/21/2022   CKD stage 3a, GFR 45-59 ml/min (HCC) 07/06/2022   Umbilical hernia 07/05/2022   Obesity, class 3 05/25/2022   Hepatic cirrhosis (HCC) 03/13/2022   Iron deficiency anemia 02/28/2022   GERD (gastroesophageal reflux disease) 02/28/2022   Generalized weakness 01/23/2022   Essential hypertension 01/23/2022   Volume overload 01/23/2022   Protein calorie malnutrition (HCC) 01/22/2022   Chronic diastolic heart failure (HCC)    Hypogonadism, male 12/26/2021   History of colonic polyps 07/27/2021   Auditory hallucinations 07/27/2021   Prolonged QT interval 04/19/2021   Hypokalemia 04/18/2021   Cellulitis of left leg 03/26/2021   OSA (obstructive sleep apnea) 01/20/2020   History of ascites 10/22/2019   Acquired hypothyroidism 10/16/2016   Depression with anxiety 10/16/2016   Thrombocytopenia (HCC) 10/16/2016   Spleen enlarged 10/16/2016   Class 3 severe obesity in adult [facility]) 02/28/2016   PCP:  [facility]:   [id number] - [place], [place] - [address]  [place] [address] Phone: [phone] Fax: [phone]

## 2023-05-13 NOTE — Plan of Care (Signed)
  Problem: Acute Rehab PT Goals(only PT should resolve) Goal: Pt Will Go Supine/Side To Sit Flowsheets (Taken 05/13/2023 1551) Pt will go Supine/Side to Sit: with minimal assist 05/13/2023 1551 by Darlyn Chamber, Student-PT Outcome: Progressing Goal: Patient Will Transfer Sit To/From Stand Flowsheets (Taken 05/13/2023 1551) Patient will transfer sit to/from stand: with minimal assist 05/13/2023 1551 by Darlyn Chamber, Student-PT Outcome: Progressing Goal: Pt Will Transfer Bed To Chair/Chair To Bed Flowsheets (Taken 05/13/2023 1551) Pt will Transfer Bed to Chair/Chair to Bed: with min assist 05/13/2023 1551 by Darlyn Chamber, Student-PT Outcome: Progressing Goal: Pt Will Ambulate Flowsheets (Taken 05/13/2023 1551) Pt will Ambulate:  15 feet  with minimal assist  with rolling walker 05/13/2023 1551 by Darlyn Chamber, Student-PT Outcome: Progressing   Daniel Crawford SPT

## 2023-05-13 NOTE — Telephone Encounter (Signed)
 noted

## 2023-05-13 NOTE — ED Notes (Signed)
 Pt at CT at this time.

## 2023-05-13 NOTE — Telephone Encounter (Signed)
 See result note.

## 2023-05-13 NOTE — Consult Note (Addendum)
MEDICAL CONSULTATION Daniel Crawford ZOX:[id number] DOB: 11/14/1955 DOA: 05/13/2023  PCP: Elmer Picker [facility]  Patient coming from: Home by EMS  I have personally briefly reviewed patient's old medical records in [facility]  Reason for consultation: Evaluation and management recommendations for elevated TSH  HPI: Daniel Crawford is a 68 year old male with stage IV Nash cirrhosis, chronic HFpEF, stage IIIb CKD, COPD, splenomegaly, GERD, chronic thrombocytopenia and history of hepatic encephalopathy, hypothyroidism, hypertension, class III obesity, type 2 diabetes mellitus, OSA who was brought in by EMS for generalized weakness and generalized pain.  He reportedly lives alone but reports he has been taking his medications as prescribed.  He woke up with generalized weakness this morning and reports that he was normal 2 days ago.  He reported some pain in his legs and reported fatigue and SOB.  He was evaluated in the ED had a chest x-ray CT head chest abdomen and pelvis.  Imaging positive for new superior endplate compression deformity of L3 without any other acute findings.  Persistent findings of cirrhotic liver and splenomegaly.  ED is planning to treat with pain management, TLSO brace and PT evaluation and social work/care management consultation for possible rehab facility placement.  Of note, some of his lab work demonstrated an elevated TSH of 76.5.  His other labs essentially stable.  His ammonia level is normal and no signs of hepatic encephalopathy.  Medical consultation was requested.   Past Medical History:  Diagnosis Date   Acute pancreatitis 10/02/2022   Anasarca 01/23/2022   Anemia    Anxiety    Asthma    CHF (congestive heart failure) (HCC)    CKD (chronic kidney disease)    D-dimer, elevated 01/22/2022   Decompensation of cirrhosis of liver (HCC) 05/21/2022   Depression    Diabetes mellitus without complication (HCC)    Dyspnea    Hepatic  encephalopathy (HCC) 07/10/2022   Hypertension    Hypothyroidism    Liver cirrhosis secondary to NASH (HCC)    NASH (nonalcoholic steatohepatitis)    Other ascites 06/19/2019   Pancytopenia (HCC) 05/21/2022   Pre-diabetes    Spleen enlarged    Thrombocytopenia (HCC)     Past Surgical History:  Procedure Laterality Date   Bilateral hernia surgery     2006, 2007   CHOLECYSTECTOMY     2016    COLONOSCOPY WITH PROPOFOL N/A 06/28/2021   Procedure: COLONOSCOPY WITH PROPOFOL;  Surgeon: Malissa Hippo, MD;  [facility];  Service: Endoscopy;  Laterality: N/A;  1020   ESOPHAGEAL DILATION N/A 11/06/2019   Procedure: ESOPHAGEAL DILATION;  Surgeon: Dolores Frame, MD;  Location[facility];  Service: Gastroenterology;  Laterality: N/A;   ESOPHAGOGASTRODUODENOSCOPY (EGD) WITH PROPOFOL N/A 11/06/2019   Procedure: ESOPHAGOGASTRODUODENOSCOPY (EGD) WITH PROPOFOL;  Surgeon: Dolores Frame, MD;  [facility];  Service: Gastroenterology;  Laterality: N/A;  1045   IR RADIOLOGIST EVAL & MGMT  02/19/2022   LAPAROSCOPIC ASSISTED VENTRAL HERNIA REPAIR     Polyp removed     in January of 2018.    POLYPECTOMY  06/28/2021   Procedure: POLYPECTOMY INTESTINAL;  Surgeon: Malissa Hippo, MD;  [facility]: [facility];  Service: Endoscopy;;   RIGHT HEART CATH N/A 01/08/2022   Procedure: RIGHT HEART CATH;  Surgeon: Corky Crafts, MD;  Location: [facility] INVASIVE CV LAB;  Service: Cardiovascular;  Laterality: N/A;   RIGHT HEART CATH N/A 01/29/2023   Procedure: RIGHT HEART CATH;  Surgeon: Laurey Morale, MD;  Location: [facility] INVASIVE CV LAB;  Service: Cardiovascular;  Laterality: N/A;     reports that he quit smoking about 8 years ago. His smoking use included cigarettes. He started smoking about 28 years ago. He has a 20 pack-year smoking history. He has been exposed to tobacco smoke. He has never used smokeless tobacco. He reports that he does not drink alcohol and does  not use drugs.  No Known Allergies  Family History  Problem Relation Age of Onset   Liver cancer Mother    Heart disease Mother    Aneurysm Sister     Prior to Admission medications   Medication Sig Start Date End Date Taking? Authorizing Provider  acetaminophen (TYLENOL) 325 MG tablet Take 2 tablets (650 mg total) by mouth every 6 (six) hours as needed for mild pain (pain score 1-3), fever or headache (or Fever >/= 101). 02/08/23  Yes Emokpae, Courage, MD  albuterol (VENTOLIN HFA) 108 (90 Base) MCG/ACT inhaler Inhale 2 puffs into the lungs every 6 (six) hours as needed for wheezing or shortness of breath. 02/08/23  Yes Emokpae, Courage, MD  Cholecalciferol (VITAMIN D3) 1.25 MG (50000 UT) CAPS Take 1 capsule by mouth once a week. 03/06/23  Yes [provider]  dapagliflozin propanediol (FARXIGA) 10 MG TABS tablet Take 1 tablet (10 mg total) by mouth daily before breakfast. 02/08/23  Yes Emokpae, Courage, MD  diclofenac Sodium (VOLTAREN) 1 % GEL Apply 2 g topically in the morning and at bedtime. 1-2 inches topically twice daily 03/06/23  Yes [provider]  hydrOXYzine (VISTARIL) 50 MG capsule Take 1 capsule (50 mg total) by mouth 3 (three) times daily as needed for anxiety or nausea. 02/08/23  Yes Emokpae, Courage, MD  lactose free nutrition (BOOST) LIQD Take 237 mLs by mouth every evening.   Yes [provider]  lactulose (CHRONULAC) 10 GM/15ML solution Take 45 mLs (30 g total) by mouth 3 (three) times daily. 02/08/23  Yes Shon Hale, MD  levothyroxine (SYNTHROID) 112 MCG tablet Take 1 tablet (112 mcg total) by mouth daily before breakfast. 03/27/23  Yes Vassie Loll, MD  ondansetron (ZOFRAN) 4 MG tablet Take 4 mg by mouth 2 (two) times daily as needed for nausea or vomiting. 10/28/22  Yes [provider]  Oxycodone HCl 10 MG TABS 1 tablet as needed for pain Orally every 6 hrs for 5 days 04/17/23  Yes [provider]  pantoprazole (PROTONIX)  40 MG tablet Take 1 tablet (40 mg total) by mouth daily. 01/25/22  Yes Emokpae, Courage, MD  potassium chloride SA (KLOR-CON M) 20 MEQ tablet Take 2 tablets in the morning and 1 tablet  in the evening 04/05/23  Yes Lee, [place], NP  rifaximin (XIFAXAN) 550 MG TABS tablet Take 1 tablet (550 mg total) by mouth 2 (two) times daily. 02/08/23  Yes Emokpae, Courage, MD  sertraline (ZOLOFT) 100 MG tablet Take 100 mg by mouth daily. 09/24/22  Yes [provider]  spironolactone (ALDACTONE) 100 MG tablet Take 1 tablet (100 mg total) by mouth daily. 02/08/23  Yes Emokpae, Courage, MD  tamsulosin (FLOMAX) 0.4 MG CAPS capsule Take 1 capsule (0.4 mg total) by mouth daily after supper. 03/27/23  Yes Vassie Loll, MD  torsemide (DEMADEX) 20 MG tablet Take 4 tablets (80 mg total) by mouth every morning AND 2 tablets (40 mg total) every evening. 05/06/23  Yes Clegg, Amy D, NP  traZODone (DESYREL) 50 MG tablet Take 1 tablet (  50 mg total) by mouth at bedtime. 02/08/23  Yes Shon Hale, MD    Physical Exam: Vitals:   05/13/23 0800 05/13/23 0815 05/13/23 0900 05/13/23 1015  BP: 106/63 116/61 130/76 (!) 118/59  Pulse: (!) 176 72 71 71  Resp: (!) 37  11 11  Temp:    97.9 F (36.6 C)  TempSrc:    Oral  SpO2: 92% 97% 99% 97%  Weight:      Height:       Constitutional: obese male, awake, alert, NAD, calm, comfortable Eyes: PERRL, lids and conjunctivae normal ENMT: Mucous membranes are moist. Posterior pharynx clear of any exudate or lesions.  Neck: normal, supple, no masses, no thyromegaly.  Respiratory: clear to auscultation bilaterally, no wheezing, no crackles. Normal respiratory effort. No accessory muscle use.  Cardiovascular: normal s1, s2 sounds, no murmurs / rubs / gallops. No extremity edema. 2+ pedal pulses. No carotid bruits.  Abdomen: no tenderness, no masses palpated. No hepatosplenomegaly. Bowel sounds positive.  Musculoskeletal: no clubbing / cyanosis. No joint deformity upper  and lower extremities. Good ROM, no contractures. Normal muscle tone.  Skin: no rashes, lesions, ulcers. No induration Neurologic: CN 2-12 grossly intact. Sensation intact, DTR normal. Strength 5/5 in all 4.  Psychiatric: Normal judgment and insight. Alert and oriented x 3. Normal mood.   Labs on Admission: I have personally reviewed following labs and imaging studies  CBC: Recent Labs  Lab 05/13/23 0616  WBC 4.6  HGB 9.4*  HCT 28.5*  MCV 86.1  PLT 60*   Basic Metabolic Panel: Recent Labs  Lab 05/13/23 0616  NA 130*  K 4.0  CL 96*  CO2 26  GLUCOSE 126*  BUN 25*  CREATININE 2.58*  CALCIUM 8.8*   GFR: Estimated Creatinine Clearance: 45.7 mL/min (A) (by C-G formula based on SCr of 2.58 mg/dL (H)). Liver Function Tests: Recent Labs  Lab 05/13/23 0616  AST 56*  ALT 22  ALKPHOS 90  BILITOT 5.5*  PROT 6.5  ALBUMIN 2.8*   No results for input(s): "LIPASE", "AMYLASE" in the last 168 hours. Recent Labs  Lab 05/13/23 0717  AMMONIA 30   Coagulation Profile: No results for input(s): "INR", "PROTIME" in the last 168 hours. Cardiac Enzymes: No results for input(s): "CKTOTAL", "CKMB", "CKMBINDEX", "TROPONINI" in the last 168 hours. BNP (last 3 results) No results for input(s): "PROBNP" in the last 8760 hours. HbA1C: No results for input(s): "HGBA1C" in the last 72 hours. CBG: No results for input(s): "GLUCAP" in the last 168 hours. Lipid Profile: No results for input(s): "CHOL", "HDL", "LDLCALC", "TRIG", "CHOLHDL", "LDLDIRECT" in the last 72 hours. Thyroid Function Tests: Recent Labs    05/13/23 0616  TSH 76.531*   Anemia Panel: No results for input(s): "VITAMINB12", "FOLATE", "FERRITIN", "TIBC", "IRON", "RETICCTPCT" in the last 72 hours. Urine analysis:    Component Value Date/Time   COLORURINE YELLOW 05/13/2023 0806   APPEARANCEUR CLEAR 05/13/2023 0806   LABSPEC 1.005 05/13/2023 0806   PHURINE 6.0 05/13/2023 0806   GLUCOSEU 50 (A) 05/13/2023 0806   HGBUR  SMALL (A) 05/13/2023 0806   BILIRUBINUR NEGATIVE 05/13/2023 0806   KETONESUR NEGATIVE 05/13/2023 0806   PROTEINUR NEGATIVE 05/13/2023 0806   NITRITE NEGATIVE 05/13/2023 0806   LEUKOCYTESUR NEGATIVE 05/13/2023 0806    Radiological Exams on Admission: CT CHEST ABDOMEN PELVIS WO CONTRAST Result Date: 05/13/2023 CLINICAL DATA:  Unintended weight loss. Generalized abdominal pain, back pain. Weakness. EXAM: CT CHEST, ABDOMEN AND PELVIS WITHOUT CONTRAST TECHNIQUE: Multidetector CT imaging of the  chest, abdomen and pelvis was performed following the standard protocol without IV contrast. RADIATION DOSE REDUCTION: This exam was performed according to the departmental dose-optimization program which includes automated exposure control, adjustment of the mA and/or kV according to patient size and/or use of iterative reconstruction technique. COMPARISON:  CT scan renal stone protocol from 04/23/2023 and CT angiography chest from 01/22/2022. FINDINGS: CT CHEST FINDINGS Cardiovascular: Normal cardiac size. No pericardial effusion. No aortic aneurysm. Mediastinum/Nodes: Visualized thyroid gland appears grossly unremarkable. No solid / cystic mediastinal masses. The esophagus is nondistended precluding optimal assessment. No mediastinal or axillary lymphadenopathy by size criteria. Evaluation of bilateral hila is limited due to lack on intravenous contrast: however, no large hilar lymphadenopathy identified. Lungs/Pleura: The central tracheo-bronchial tree is patent. No mass or consolidation. No pleural effusion or pneumothorax. No suspicious lung nodules. Musculoskeletal: The visualized soft tissues of the chest wall are grossly unremarkable. No suspicious osseous lesions. There are mild multilevel degenerative changes in the visualized spine. CT ABDOMEN PELVIS FINDINGS Hepatobiliary: The liver is normal in size. There is liver surface irregularity/nodularity, compatible with cirrhosis. No discrete focal mass seen on  this unenhanced exam. No intrahepatic or extrahepatic bile duct dilation. Gallbladder is surgically absent. Pancreas: Small/atrophic pancreas.  Not well evaluated on this exam. Spleen: Moderate to markedly enlarged and lobulated spleen measuring up to 13.3 x 20.8 cm orthogonally on coronal plane. No focal lesion seen within the limitations of this unenhanced exam. Adrenals/Urinary Tract: Adrenal glands are unremarkable. No suspicious renal mass within the limitations of this unenhanced exam. There is a 4 mm nonobstructing calculus in the right kidney lower pole calyx and a 1-2 mm nonobstructing calculus in the left kidney interpolar region. No ureterolithiasis or obstructive uropathy on either side. Unremarkable urinary bladder. Stomach/Bowel: No disproportionate dilation of the small or large bowel loops. No evidence of abnormal bowel wall thickening or inflammatory changes. The appendix is unremarkable. Vascular/Lymphatic: There is mild ascites mainly in the perihepatic/perisplenic region. No pneumoperitoneum. There is also fat stranding mainly concentrated around the mesenteric vessels which is also likely secondary to ascites. No abdominal or pelvic lymphadenopathy, by size criteria. No aneurysmal dilation of the major abdominal arteries. Note is made of prominent venous collaterals near the lower thoracic esophagus, GE junction and in the left upper quadrant, not well evaluated on this exam but favored to represent sequela of portal hypertension. Reproductive: Normal size prostate. Symmetric seminal vesicles. Other: Redemonstration of small-to-moderate periumbilical hernia containing fat and trace amount of ascitic fluid. There are metallic anchors in the left inguinal region, from prior hernia repair. However there is still small sized fat containing left inguinal hernia. There is moderate anasarca. Musculoskeletal: No suspicious osseous lesions. There are mild - moderate multilevel degenerative changes in the  visualized spine. There is new superior endplate compression deformity of L3 vertebral body, when compared to the prior exam from 04/23/2023. No significant retropulsion or spinal canal compromise. IMPRESSION: 1. No discrete neoplastic process identified within the chest, abdomen or pelvis on this unenhanced exam. 2. There is new superior endplate compression deformity of L3 vertebral body, when compared to the prior exam from 04/23/2023. No significant retropulsion or spinal canal compromise. 3. No lung mass, consolidation, pleural effusion or pneumothorax. 4. Cirrhotic liver with sequela of portal hypertension characterized by mild ascites, splenomegaly and venous collaterals. 5. Multiple other nonacute observations, as described above. Electronically Signed   By: Jules Schick M.D.   On: 05/13/2023 09:37   CT Head Wo Contrast Result Date: 05/13/2023  CLINICAL DATA:  Mental status change with unknown cause. EXAM: CT HEAD WITHOUT CONTRAST TECHNIQUE: Contiguous axial images were obtained from the base of the skull through the vertex without intravenous contrast. RADIATION DOSE REDUCTION: This exam was performed according to the departmental dose-optimization program which includes automated exposure control, adjustment of the mA and/or kV according to patient size and/or use of iterative reconstruction technique. COMPARISON:  02/01/2023 FINDINGS: Brain: No evidence of acute infarction, hemorrhage, hydrocephalus, extra-axial collection or mass lesion/mass effect. Vascular: No hyperdense vessel or unexpected calcification. Skull: Normal. Negative for fracture or focal lesion. Sinuses/Orbits: No acute finding. IMPRESSION: Stable, negative head CT. Electronically Signed   By: Tiburcio Pea M.D.   On: 05/13/2023 09:19   DG Chest Portable 1 View Result Date: 05/13/2023 CLINICAL DATA:  Generalized weakness and pain EXAM: PORTABLE CHEST 1 VIEW COMPARISON:  03/25/2023 FINDINGS: Stable generous heart size. Normal  aortic and hilar contours. There is no edema, consolidation, effusion, or pneumothorax. IMPRESSION: Stable exam.  No acute finding. Electronically Signed   By: Tiburcio Pea M.D.   On: 05/13/2023 07:03   Assessment/Plan Active Problems:   Acquired hypothyroidism   Depression with anxiety   Thrombocytopenia (HCC)   History of ascites   OSA (obstructive sleep apnea)   Hypogonadism, male   Chronic diastolic heart failure (HCC)   Generalized weakness   Essential hypertension   Volume overload   Iron deficiency anemia   GERD (gastroesophageal reflux disease)   CKD stage 3a, GFR 45-59 ml/min (HCC)   COPD (chronic obstructive pulmonary disease) (HCC)   Obesity, Class III, BMI 40-49.9 (morbid obesity) (HCC)   Orthostatic hypotension   Hip pain   (HFpEF) heart failure with preserved ejection fraction (HCC)   Lumbar back pain   Elevated TSH   Acquired Hypothyroidism  Elevated TSH - upon review of medical records, we see that his TSH was 33 in Dec 2024.  - he is grossly underdosed for levothyroxine based on weight - pt says that he had been taking 200 mcg of levothyroxine before they reduced the dose to 112 mcg - Open Evidence review reports that dosing levothyroxine for morbid obesity recommend to start with dose of  1.6 mcg/kg/day based on actual body weight and adjust according to clinical response and serum TSH levels.  - based on weight he should be on 256 mcg  - will restart him back on 200 mcg daily with breakfast, he was counseled that he needs to take this on an empty stomach before breakfast for better absorption, he verbalized understanding.   Generalized Weakness - awaiting PT/OT eval  - Pt agreeable to home health and SNF based on recommendations  HFpEF - resumed home meds as ordered by ED provider  Stage IV NASH cirrhosis - well compensated - ammonia normal - all liver medications resumed by ED provider  Stage 3a CKD  - stable   New findings of L3 compression  fracture -Appropriately treated in ED with TLSO brace and pain management -PT evaluation -TOC consult for [facility] vs SNF   Level of care:  Standley Dakins MD [facility] How to contact the [facility] Attending or Consulting provider 7A - 7P or covering provider during after hours 7P -7A, for this patient?  Check the care team in [facility] and look for a) attending/consulting [facility] provider listed and b) the [facility] team listed Log into [email or web address] and use [place]'s universal password to access. If you do not have the password, please contact the hospital operator. Locate  the [facility] provider you are looking for under [facility] and page to a number that you can be directly reached. If you still have difficulty reaching the provider, please page the [facility] (Director on Call) for the Hospitalists listed on amion for assistance.   If 7PM-7AM, please contact night-coverage [email or web address] Password [email or web address]  05/13/2023, 1:06 PM

## 2023-05-14 DIAGNOSIS — N1831 Chronic kidney disease, stage 3a: Secondary | ICD-10-CM

## 2023-05-14 DIAGNOSIS — E8779 Other fluid overload: Secondary | ICD-10-CM | POA: Diagnosis not present

## 2023-05-14 DIAGNOSIS — E039 Hypothyroidism, unspecified: Secondary | ICD-10-CM | POA: Diagnosis not present

## 2023-05-14 DIAGNOSIS — M6281 Muscle weakness (generalized): Secondary | ICD-10-CM | POA: Diagnosis not present

## 2023-05-14 DIAGNOSIS — K7581 Nonalcoholic steatohepatitis (NASH): Secondary | ICD-10-CM | POA: Diagnosis not present

## 2023-05-14 DIAGNOSIS — I509 Heart failure, unspecified: Secondary | ICD-10-CM | POA: Diagnosis not present

## 2023-05-14 DIAGNOSIS — S32020D Wedge compression fracture of second lumbar vertebra, subsequent encounter for fracture with routine healing: Secondary | ICD-10-CM | POA: Diagnosis not present

## 2023-05-14 DIAGNOSIS — R7989 Other specified abnormal findings of blood chemistry: Secondary | ICD-10-CM | POA: Diagnosis not present

## 2023-05-14 DIAGNOSIS — N1832 Chronic kidney disease, stage 3b: Secondary | ICD-10-CM | POA: Diagnosis not present

## 2023-05-14 DIAGNOSIS — S32030A Wedge compression fracture of third lumbar vertebra, initial encounter for closed fracture: Secondary | ICD-10-CM | POA: Diagnosis not present

## 2023-05-14 DIAGNOSIS — R279 Unspecified lack of coordination: Secondary | ICD-10-CM | POA: Diagnosis not present

## 2023-05-14 DIAGNOSIS — I10 Essential (primary) hypertension: Secondary | ICD-10-CM | POA: Diagnosis not present

## 2023-05-14 DIAGNOSIS — K7460 Unspecified cirrhosis of liver: Secondary | ICD-10-CM | POA: Diagnosis not present

## 2023-05-14 DIAGNOSIS — M545 Low back pain, unspecified: Secondary | ICD-10-CM | POA: Diagnosis not present

## 2023-05-14 DIAGNOSIS — G894 Chronic pain syndrome: Secondary | ICD-10-CM | POA: Diagnosis not present

## 2023-05-14 DIAGNOSIS — I5032 Chronic diastolic (congestive) heart failure: Secondary | ICD-10-CM | POA: Diagnosis not present

## 2023-05-14 DIAGNOSIS — R531 Weakness: Secondary | ICD-10-CM | POA: Diagnosis not present

## 2023-05-14 DIAGNOSIS — M8448XA Pathological fracture, other site, initial encounter for fracture: Secondary | ICD-10-CM | POA: Diagnosis not present

## 2023-05-14 LAB — TROPONIN I (HIGH SENSITIVITY)
Troponin I (High Sensitivity): 6 ng/L (ref <18)
Troponin I (High Sensitivity): 7 ng/L (ref <18)

## 2023-05-14 LAB — T3, FREE: T3, Free: 1.4 pg/mL — ABNORMAL LOW (ref 2.0–4.4)

## 2023-05-14 MED ORDER — OXYCODONE-ACETAMINOPHEN 5-325 MG PO TABS: 1.0000 | ORAL_TABLET | Freq: Four times a day (QID) | ORAL | 0 refills | Status: DC | PRN

## 2023-05-14 MED ORDER — OXYCODONE-ACETAMINOPHEN 5-325 MG PO TABS
1.0000 | ORAL_TABLET | Freq: Four times a day (QID) | ORAL | 0 refills | Status: DC | PRN
Start: 2023-05-14 — End: 2023-05-14

## 2023-05-14 MED ORDER — LIDOCAINE 5 % EX PTCH: 1.0000 | MEDICATED_PATCH | Freq: Every day | CUTANEOUS | Status: DC | PRN

## 2023-05-14 MED ORDER — OXYCODONE-ACETAMINOPHEN 5-325 MG PO TABS: 1.0000 | ORAL_TABLET | Freq: Four times a day (QID) | ORAL | 0 refills | Status: AC | PRN

## 2023-05-14 MED ORDER — LEVOTHYROXINE SODIUM 200 MCG PO TABS: 200.0000 ug | ORAL_TABLET | Freq: Every day | ORAL | Status: DC

## 2023-05-14 MED ORDER — LACTULOSE 10 GM/15ML PO SOLN: 30.0000 g | Freq: Two times a day (BID) | ORAL | Status: DC

## 2023-05-14 MED ORDER — METHOCARBAMOL 500 MG PO TABS: 500.0000 mg | ORAL_TABLET | Freq: Three times a day (TID) | ORAL | Status: DC | PRN

## 2023-05-14 MED ORDER — POTASSIUM CHLORIDE CRYS ER 20 MEQ PO TBCR: 40.0000 meq | EXTENDED_RELEASE_TABLET | Freq: Every day | ORAL | Status: DC

## 2023-05-14 NOTE — ED Notes (Signed)
Pt urinated on him self, said he missed the urinal. Another Tech and I helped pt to bathroom in wheelchair, gave him washcloths, his stuff to wash up with, change of clothes. Soiled clothes are in a pt's belonging bag. Pt has clean dry bedding. Pt has access to urinal, phone,ice water and call button.

## 2023-05-14 NOTE — Discharge Summary (Signed)
Physician Discharge Summary  Daniel Crawford UJW:[id number] DOB: Jan 31, 1956 DOA: 05/13/2023  PCP: [facility]  Admit date: 05/13/2023 Discharge date: 05/14/2023  Recommendations Please check BMP in 5 days  Please weight patient daily or every other day Low sodium diet as he is a heart failure patient Please check TSH in 4-6 weeks   Disposition:  [facility]   Discharge Condition: STABLE   CODE STATUS: FULL DIET: low sodium heart healthy    Visit Summary: Please see all hospital notes, images, labs for full details of the hospitalization. 68 year old male with stage IV Nash cirrhosis, chronic HFpEF, stage IIIb CKD, COPD, splenomegaly, GERD, chronic thrombocytopenia and history of hepatic encephalopathy, hypothyroidism, hypertension, class III obesity, type 2 diabetes mellitus, OSA who was brought in by EMS for generalized weakness and generalized pain.  He reportedly lives alone but reports he has been taking his medications as prescribed.  He woke up with generalized weakness this morning and reports that he was normal 2 days ago.  He reported some pain in his legs and reported fatigue and SOB.  He was evaluated in the ED had a chest x-ray CT head chest abdomen and pelvis.  Imaging positive for new superior endplate compression deformity of L3 without any other acute findings.  Persistent findings of cirrhotic liver and splenomegaly.  ED is planning to treat with pain management, TLSO brace and PT evaluation and social work/care management consultation for possible rehab facility placement.  Of note, some of his lab work demonstrated an elevated TSH of 76.5.  His other labs essentially stable.  His ammonia level is normal and no signs of hepatic encephalopathy.  Medical consultation was requested.  Acquired Hypothyroidism  Elevated TSH - upon review of medical records, we see that his TSH was 33 in Dec 2024.  - he is grossly underdosed for levothyroxine based on weight -  pt says that he had been taking 200 mcg of levothyroxine before his providers reduced the dose to 112 mcg - Open Evidence review reports that dosing levothyroxine for morbid obesity recommend to start with dose of  1.6 mcg/kg/day based on actual body weight and adjust according to clinical response and serum TSH levels.  - based on weight he should be on 256 mcg  - restarted him back on 200 mcg daily with breakfast, he was counseled that he needs to take this on an empty stomach before breakfast for better absorption, he verbalized understanding.  - I have reconciled his home medications in anticipation of him getting a SNF bed.      Generalized Weakness - PT/OT eval recommending SNF placement and patient is agreeable  - Pt agreeable SNF based on recommendations and requesting something near [place] or [place] if possible    Fall overnight - evaluated by Dr. Estell Harpin  -  no trauma to head or neck, no loss of consciousness - fall precautions - he was moved to be closer to the nursing station - he is ok to safely discharge to SNF today for ongoing rehabilitation   HFpEF - he is compensated and weight stable  - resumed home meds as ordered by ED provider - please check bMP in 5 days to follow up lytes and renal fxn   Stage IV NASH cirrhosis - well compensated - ammonia normal - all liver medications resumed by ED provider   Stage 3a CKD  - stable    New findings of L3 compression fracture -Appropriately treated in ED with TLSO brace and  pain management -PT evaluation appreciated.  He remains a high fall risk  -TOC consulted and arranging for SNF  -Pt [facility] per [facility]    Discharge Diagnoses:  Active Problems:   Acquired hypothyroidism   Depression with anxiety   Thrombocytopenia (HCC)   History of ascites   OSA (obstructive sleep apnea)   Hypogonadism, male   Chronic diastolic heart failure (HCC)   Generalized weakness   Essential hypertension    Volume overload   Iron deficiency anemia   GERD (gastroesophageal reflux disease)   CKD stage 3a, GFR 45-59 ml/min (HCC)   COPD (chronic obstructive pulmonary disease) (HCC)   Obesity, Class III, BMI 40-49.9 (morbid obesity) (HCC)   Orthostatic hypotension   Hip pain   (HFpEF) heart failure with preserved ejection fraction (HCC)   Lumbar back pain   Elevated TSH   Discharge Instructions:  Allergies as of 05/14/2023   No Known Allergies      Medication List     STOP taking these medications    Oxycodone HCl 10 MG Tabs       TAKE these medications    acetaminophen 325 MG tablet Commonly known as: TYLENOL Take 2 tablets (650 mg total) by mouth every 6 (six) hours as needed for mild pain (pain score 1-3), fever or headache (or Fever >/= 101).   albuterol 108 (90 Base) MCG/ACT inhaler Commonly known as: VENTOLIN HFA Inhale 2 puffs into the lungs every 6 (six) hours as needed for wheezing or shortness of breath.   dapagliflozin propanediol 10 MG Tabs tablet Commonly known as: Farxiga Take 1 tablet (10 mg total) by mouth daily before breakfast.   diclofenac Sodium 1 % Gel Commonly known as: VOLTAREN Apply 2 g topically in the morning and at bedtime. 1-2 inches topically twice daily   hydrOXYzine 50 MG capsule Commonly known as: VISTARIL Take 1 capsule (50 mg total) by mouth 3 (three) times daily as needed for anxiety or nausea.   lactose free nutrition Liqd Take 237 mLs by mouth every evening.   lactulose 10 GM/15ML solution Commonly known as: CHRONULAC Take 45 mLs (30 g total) by mouth 2 (two) times daily. What changed: when to take this   levothyroxine 200 MCG tablet Commonly known as: SYNTHROID Take 1 tablet (200 mcg total) by mouth daily before breakfast. What changed:  medication strength how much to take   lidocaine 5 % Commonly known as: LIDODERM Place 1 patch onto the skin daily as needed. Remove & Discard patch within 12 hours or as directed by  MD   methocarbamol 500 MG tablet Commonly known as: ROBAXIN Take 1 tablet (500 mg total) by mouth every 8 (eight) hours as needed for muscle spasms.   ondansetron 4 MG tablet Commonly known as: ZOFRAN Take 4 mg by mouth 2 (two) times daily as needed for nausea or vomiting.   oxyCODONE-acetaminophen 5-325 MG tablet Commonly known as: PERCOCET/ROXICET Take 1-2 tablets by mouth every 6 (six) hours as needed for up to 3 days for severe pain (pain score 7-10).   pantoprazole 40 MG tablet Commonly known as: PROTONIX Take 1 tablet (40 mg total) by mouth daily.   potassium chloride SA 20 MEQ tablet Commonly known as: KLOR-CON M Take 2 tablets (40 mEq total) by mouth daily. What changed:  how much to take how to take this when to take this additional instructions   rifaximin 550 MG Tabs tablet Commonly known as: XIFAXAN Take 1 tablet (  550 mg total) by mouth 2 (two) times daily.   sertraline 100 MG tablet Commonly known as: ZOLOFT Take 100 mg by mouth daily.   spironolactone 100 MG tablet Commonly known as: ALDACTONE Take 1 tablet (100 mg total) by mouth daily.   tamsulosin 0.4 MG Caps capsule Commonly known as: FLOMAX Take 1 capsule (0.4 mg total) by mouth daily after supper.   torsemide 20 MG tablet Commonly known as: DEMADEX Take 4 tablets (80 mg total) by mouth every morning AND 2 tablets (40 mg total) every evening.   traZODone 50 MG tablet Commonly known as: DESYREL Take 1 tablet (50 mg total) by mouth at bedtime.   Vitamin D3 1.25 MG (50000 UT) Caps Take 1 capsule by mouth once a week.        Contact information for follow-up providers     [facility]. Schedule an appointment as soon as possible for a visit in 1 week(s).   Why: Hospital Follow Up Contact information: [address] [place] [address] [phone]         [facility]. Schedule an appointment as soon as possible for a visit in  1 week.   Specialty: Neurosurgery Contact information: [address] [place] [address] [place] [address] [phone]              Contact information for after-discharge care     Destination     [facility] .   Service: Skilled Nursing Contact information: [address] [place] [address] [phone]                    No Known Allergies Allergies as of 05/14/2023   No Known Allergies      Medication List     STOP taking these medications    Oxycodone HCl 10 MG Tabs       TAKE these medications    acetaminophen 325 MG tablet Commonly known as: TYLENOL Take 2 tablets (650 mg total) by mouth every 6 (six) hours as needed for mild pain (pain score 1-3), fever or headache (or Fever >/= 101).   albuterol 108 (90 Base) MCG/ACT inhaler Commonly known as: VENTOLIN HFA Inhale 2 puffs into the lungs every 6 (six) hours as needed for wheezing or shortness of breath.   dapagliflozin propanediol 10 MG Tabs tablet Commonly known as: Farxiga Take 1 tablet (10 mg total) by mouth daily before breakfast.   diclofenac Sodium 1 % Gel Commonly known as: VOLTAREN Apply 2 g topically in the morning and at bedtime. 1-2 inches topically twice daily   hydrOXYzine 50 MG capsule Commonly known as: VISTARIL Take 1 capsule (50 mg total) by mouth 3 (three) times daily as needed for anxiety or nausea.   lactose free nutrition Liqd Take 237 mLs by mouth every evening.   lactulose 10 GM/15ML solution Commonly known as: CHRONULAC Take 45 mLs (30 g total) by mouth 2 (two) times daily. What changed: when to take this   levothyroxine 200 MCG tablet Commonly known as: SYNTHROID Take 1 tablet (200 mcg total) by mouth daily before breakfast. What changed:  medication strength how much to take   lidocaine 5 % Commonly known as: LIDODERM Place 1 patch onto the skin daily as needed. Remove & Discard patch  within 12 hours or as directed by MD   methocarbamol 500 MG tablet Commonly known as: ROBAXIN Take 1 tablet (500 mg  total) by mouth every 8 (eight) hours as needed for muscle spasms.   ondansetron 4 MG tablet Commonly known as: ZOFRAN Take 4 mg by mouth 2 (two) times daily as needed for nausea or vomiting.   oxyCODONE-acetaminophen 5-325 MG tablet Commonly known as: PERCOCET/ROXICET Take 1-2 tablets by mouth every 6 (six) hours as needed for up to 3 days for severe pain (pain score 7-10).   pantoprazole 40 MG tablet Commonly known as: PROTONIX Take 1 tablet (40 mg total) by mouth daily.   potassium chloride SA 20 MEQ tablet Commonly known as: KLOR-CON M Take 2 tablets (40 mEq total) by mouth daily. What changed:  how much to take how to take this when to take this additional instructions   rifaximin 550 MG Tabs tablet Commonly known as: XIFAXAN Take 1 tablet (550 mg total) by mouth 2 (two) times daily.   sertraline 100 MG tablet Commonly known as: ZOLOFT Take 100 mg by mouth daily.   spironolactone 100 MG tablet Commonly known as: ALDACTONE Take 1 tablet (100 mg total) by mouth daily.   tamsulosin 0.4 MG Caps capsule Commonly known as: FLOMAX Take 1 capsule (0.4 mg total) by mouth daily after supper.   torsemide 20 MG tablet Commonly known as: DEMADEX Take 4 tablets (80 mg total) by mouth every morning AND 2 tablets (40 mg total) every evening.   traZODone 50 MG tablet Commonly known as: DESYREL Take 1 tablet (50 mg total) by mouth at bedtime.   Vitamin D3 1.25 MG (50000 UT) Caps Take 1 capsule by mouth once a week.        Procedures/Studies: CT CHEST ABDOMEN PELVIS WO CONTRAST Result Date: 05/13/2023 CLINICAL DATA:  Unintended weight loss. Generalized abdominal pain, back pain. Weakness. EXAM: CT CHEST, ABDOMEN AND PELVIS WITHOUT CONTRAST TECHNIQUE: Multidetector CT imaging of the chest, abdomen and pelvis was performed following the standard protocol  without IV contrast. RADIATION DOSE REDUCTION: This exam was performed according to the departmental dose-optimization program which includes automated exposure control, adjustment of the mA and/or kV according to patient size and/or use of iterative reconstruction technique. COMPARISON:  CT scan renal stone protocol from 04/23/2023 and CT angiography chest from 01/22/2022. FINDINGS: CT CHEST FINDINGS Cardiovascular: Normal cardiac size. No pericardial effusion. No aortic aneurysm. Mediastinum/Nodes: Visualized thyroid gland appears grossly unremarkable. No solid / cystic mediastinal masses. The esophagus is nondistended precluding optimal assessment. No mediastinal or axillary lymphadenopathy by size criteria. Evaluation of bilateral hila is limited due to lack on intravenous contrast: however, no large hilar lymphadenopathy identified. Lungs/Pleura: The central tracheo-bronchial tree is patent. No mass or consolidation. No pleural effusion or pneumothorax. No suspicious lung nodules. Musculoskeletal: The visualized soft tissues of the chest wall are grossly unremarkable. No suspicious osseous lesions. There are mild multilevel degenerative changes in the visualized spine. CT ABDOMEN PELVIS FINDINGS Hepatobiliary: The liver is normal in size. There is liver surface irregularity/nodularity, compatible with cirrhosis. No discrete focal mass seen on this unenhanced exam. No intrahepatic or extrahepatic bile duct dilation. Gallbladder is surgically absent. Pancreas: Small/atrophic pancreas.  Not well evaluated on this exam. Spleen: Moderate to markedly enlarged and lobulated spleen measuring up to 13.3 x 20.8 cm orthogonally on coronal plane. No focal lesion seen within the limitations of this unenhanced exam. Adrenals/Urinary Tract: Adrenal glands are unremarkable. No suspicious renal mass within the limitations of this unenhanced exam. There is a 4 mm nonobstructing calculus in the right kidney lower pole calyx and a  1-2 mm nonobstructing  calculus in the left kidney interpolar region. No ureterolithiasis or obstructive uropathy on either side. Unremarkable urinary bladder. Stomach/Bowel: No disproportionate dilation of the small or large bowel loops. No evidence of abnormal bowel wall thickening or inflammatory changes. The appendix is unremarkable. Vascular/Lymphatic: There is mild ascites mainly in the perihepatic/perisplenic region. No pneumoperitoneum. There is also fat stranding mainly concentrated around the mesenteric vessels which is also likely secondary to ascites. No abdominal or pelvic lymphadenopathy, by size criteria. No aneurysmal dilation of the major abdominal arteries. Note is made of prominent venous collaterals near the lower thoracic esophagus, GE junction and in the left upper quadrant, not well evaluated on this exam but favored to represent sequela of portal hypertension. Reproductive: Normal size prostate. Symmetric seminal vesicles. Other: Redemonstration of small-to-moderate periumbilical hernia containing fat and trace amount of ascitic fluid. There are metallic anchors in the left inguinal region, from prior hernia repair. However there is still small sized fat containing left inguinal hernia. There is moderate anasarca. Musculoskeletal: No suspicious osseous lesions. There are mild - moderate multilevel degenerative changes in the visualized spine. There is new superior endplate compression deformity of L3 vertebral body, when compared to the prior exam from 04/23/2023. No significant retropulsion or spinal canal compromise. IMPRESSION: 1. No discrete neoplastic process identified within the chest, abdomen or pelvis on this unenhanced exam. 2. There is new superior endplate compression deformity of L3 vertebral body, when compared to the prior exam from 04/23/2023. No significant retropulsion or spinal canal compromise. 3. No lung mass, consolidation, pleural effusion or pneumothorax. 4. Cirrhotic  liver with sequela of portal hypertension characterized by mild ascites, splenomegaly and venous collaterals. 5. Multiple other nonacute observations, as described above. Electronically Signed   By: Jules Schick M.D.   On: 05/13/2023 09:37   CT Head Wo Contrast Result Date: 05/13/2023 CLINICAL DATA:  Mental status change with unknown cause. EXAM: CT HEAD WITHOUT CONTRAST TECHNIQUE: Contiguous axial images were obtained from the base of the skull through the vertex without intravenous contrast. RADIATION DOSE REDUCTION: This exam was performed according to the departmental dose-optimization program which includes automated exposure control, adjustment of the mA and/or kV according to patient size and/or use of iterative reconstruction technique. COMPARISON:  02/01/2023 FINDINGS: Brain: No evidence of acute infarction, hemorrhage, hydrocephalus, extra-axial collection or mass lesion/mass effect. Vascular: No hyperdense vessel or unexpected calcification. Skull: Normal. Negative for fracture or focal lesion. Sinuses/Orbits: No acute finding. IMPRESSION: Stable, negative head CT. Electronically Signed   By: Tiburcio Pea M.D.   On: 05/13/2023 09:19   DG Chest Portable 1 View Result Date: 05/13/2023 CLINICAL DATA:  Generalized weakness and pain EXAM: PORTABLE CHEST 1 VIEW COMPARISON:  03/25/2023 FINDINGS: Stable generous heart size. Normal aortic and hilar contours. There is no edema, consolidation, effusion, or pneumothorax. IMPRESSION: Stable exam.  No acute finding. Electronically Signed   By: Tiburcio Pea M.D.   On: 05/13/2023 07:03   DG Lumbar Spine 2-3 Views Result Date: 05/08/2023 CLINICAL DATA:  Acute lumbar pain. Low back pain. Patient reports frequent falls. EXAM: LUMBAR SPINE - 2-3 VIEW COMPARISON:  Reformats from abdominopelvic CT 04/22/2023 FINDINGS: Five non-rib-bearing lumbar vertebra. The bones are subjectively under mineralized. No evidence of fracture or compression deformity. Trace  retrolisthesis of L1 on L2 and L5 on S1. Moderate multilevel degenerative disc disease with disc space narrowing and spurring L1-L2, L2-L3, L4-L5 and L5-S1. Moderate diffuse facet hypertrophy peer in no pars defects or suspicious bone lesion by radiograph. The sacroiliac  joints are congruent and normal. IMPRESSION: 1. Moderate multilevel degenerative disc disease and facet hypertrophy. 2. Trace retrolisthesis of L1 on L2 and L5 on S1. Electronically Signed   By: Narda Rutherford M.D.   On: 05/08/2023 10:27   CT Renal Stone Study Result Date: 04/23/2023 CLINICAL DATA:  Recurring flank pain for a few weeks, initial encounter EXAM: CT ABDOMEN AND PELVIS WITHOUT CONTRAST TECHNIQUE: Multidetector CT imaging of the abdomen and pelvis was performed following the standard protocol without IV contrast. RADIATION DOSE REDUCTION: This exam was performed according to the departmental dose-optimization program which includes automated exposure control, adjustment of the mA and/or kV according to patient size and/or use of iterative reconstruction technique. COMPARISON:  10/20/2022 FINDINGS: Lower chest: No acute abnormality. Hepatobiliary: Liver is again shrunken and cirrhotic in nature. Gallbladder has been surgically removed. Mild perihepatic ascites is noted. Pancreas: Unremarkable. No pancreatic ductal dilatation or surrounding inflammatory changes. Spleen: Spleen is enlarged in size but stable. Adrenals/Urinary Tract: The adrenal glands are within normal limits. Kidneys are well visualized bilaterally. Small 2-3 mm nonobstructing renal calculi are noted. The ureters are within normal limits. Bladder is decompressed. Stomach/Bowel: The appendix is not well visualized. No inflammatory changes to suggest appendicitis are noted. No obstructive or inflammatory changes of the colon are seen. The stomach is decompressed. Small bowel shows no obstructive changes. Vascular/Lymphatic: Atherosclerotic calcifications are noted. No  significant lymphadenopathy is noted. Diffuse haziness is noted throughout the mesentery similar to that seen on the prior exam. Changes of anasarca are noted as well. This is all likely related to the degree of portal hypertension. Reproductive: Prostate is unremarkable. Other: Mild ascites is noted. Large bilobed umbilical hernia is noted. This is stable from the prior exam. Changes of prior left inguinal hernia repair are noted. Musculoskeletal: Healing rib fractures on the left are seen. Degenerative changes of the lumbar spine are noted. IMPRESSION: Changes of cirrhosis and portal hypertension. Mild ascites, anasarca and mesenteric haziness are again seen. Stable splenomegaly. Scattered bilateral renal calculi without obstructive change. Bilobed umbilical hernia. Electronically Signed   By: Alcide Clever M.D.   On: 04/23/2023 00:31     Subjective: Pt reports that he is agreeable to go to SNF for ongoing rehabilitation   Discharge Exam: Vitals:   05/13/23 2058 05/14/23 0609  BP: 120/71 102/61  Pulse: 80 77  Resp: 19 16  Temp: 98 F (36.7 C) 98.1 F (36.7 C)  SpO2: 93% 99%   Vitals:   05/13/23 1015 05/13/23 1215 05/13/23 2058 05/14/23 0609  BP: (!) 118/59 130/74 120/71 102/61  Pulse: 71 77 80 77  Resp: 11 15 19 16   Temp: 97.9 F (36.6 C)  98 F (36.7 C) 98.1 F (36.7 C)  TempSrc: Oral  Oral Oral  SpO2: 97% 100% 93% 99%  Weight:      Height:        The results of significant diagnostics from this hospitalization (including imaging, microbiology, ancillary and laboratory) are listed below for reference.     Microbiology: Recent Results (from the past 240 hours)  Resp panel by RT-PCR (RSV, Flu A&B, Covid) Anterior Nasal Swab     Status: None   Collection Time: 05/13/23  6:04 AM   Specimen: Anterior Nasal Swab  Result Value Ref Range Status   SARS Coronavirus 2 by RT PCR NEGATIVE NEGATIVE Final    Comment: (NOTE) SARS-CoV-2 target nucleic acids are NOT DETECTED.  The  SARS-CoV-2 RNA is generally detectable in upper respiratory specimens during  the acute phase of infection. The lowest concentration of SARS-CoV-2 viral copies this assay can detect is 138 copies/mL. A negative result does not preclude SARS-Cov-2 infection and should not be used as the sole basis for treatment or other patient management decisions. A negative result [place] occur with  improper specimen collection/handling, submission of specimen other than nasopharyngeal swab, presence of viral mutation(s) within the areas targeted by this assay, and inadequate number of viral copies(<138 copies/mL). A negative result must be combined with clinical observations, patient history, and epidemiological information. The expected result is Negative.  Fact Sheet for Patients:  BloggerCourse.com  Fact Sheet for Healthcare Providers:  SeriousBroker.it  This test is no t yet approved or cleared by the [place] FDA and  has been authorized for detection and/or diagnosis of SARS-CoV-2 by FDA under an Emergency Use Authorization (EUA). This EUA will remain  in effect (meaning this test can be used) for the duration of the COVID-19 declaration under Section 564(b)(1) of the Act, 21 U.S.C.section 360bbb-3(b)(1), unless the authorization is terminated  or revoked sooner.       Influenza A by PCR NEGATIVE NEGATIVE Final   Influenza B by PCR NEGATIVE NEGATIVE Final    Comment: (NOTE) The Xpert Xpress SARS-CoV-2/FLU/RSV plus assay is intended as an aid in the diagnosis of influenza from Nasopharyngeal swab specimens and should not be used as a sole basis for treatment. Nasal washings and aspirates are unacceptable for Xpert Xpress SARS-CoV-2/FLU/RSV testing.  Fact Sheet for Patients: BloggerCourse.com  Fact Sheet for Healthcare Providers: SeriousBroker.it  This test is not yet approved or  cleared by the [place] FDA and has been authorized for detection and/or diagnosis of SARS-CoV-2 by FDA under an Emergency Use Authorization (EUA). This EUA will remain in effect (meaning this test can be used) for the duration of the COVID-19 declaration under Section 564(b)(1) of the Act, 21 U.S.C. section 360bbb-3(b)(1), unless the authorization is terminated or revoked.     Resp Syncytial Virus by PCR NEGATIVE NEGATIVE Final    Comment: (NOTE) Fact Sheet for Patients: BloggerCourse.com  Fact Sheet for Healthcare Providers: SeriousBroker.it  This test is not yet approved or cleared by the [place] FDA and has been authorized for detection and/or diagnosis of SARS-CoV-2 by FDA under an Emergency Use Authorization (EUA). This EUA will remain in effect (meaning this test can be used) for the duration of the COVID-19 declaration under Section 564(b)(1) of the Act, 21 U.S.C. section 360bbb-3(b)(1), unless the authorization is terminated or revoked.  Performed at [facility], [address], [place], [place] [address]      Labs: BNP (last 3 results) Recent Labs    04/04/23 1405 04/12/23 1230 05/13/23 0717  BNP 82.8 53.0 36.0   Basic Metabolic Panel: Recent Labs  Lab 05/13/23 0616  NA 130*  K 4.0  CL 96*  CO2 26  GLUCOSE 126*  BUN 25*  CREATININE 2.58*  CALCIUM 8.8*   Liver Function Tests: Recent Labs  Lab 05/13/23 0616  AST 56*  ALT 22  ALKPHOS 90  BILITOT 5.5*  PROT 6.5  ALBUMIN 2.8*   No results for input(s): "LIPASE", "AMYLASE" in the last 168 hours. Recent Labs  Lab 05/13/23 0717  AMMONIA 30   CBC: Recent Labs  Lab 05/13/23 0616  WBC 4.6  HGB 9.4*  HCT 28.5*  MCV 86.1  PLT 60*   Cardiac Enzymes: No results for input(s): "CKTOTAL", "CKMB", "CKMBINDEX", "TROPONINI" in the last 168 hours. BNP: Invalid input(s): "  POCBNP" CBG: No results for input(s): "GLUCAP" in the last 168  hours. D-Dimer No results for input(s): "DDIMER" in the last 72 hours. Hgb A1c No results for input(s): "HGBA1C" in the last 72 hours. Lipid Profile No results for input(s): "CHOL", "HDL", "LDLCALC", "TRIG", "CHOLHDL", "LDLDIRECT" in the last 72 hours. Thyroid function studies Recent Labs    05/13/23 0616 05/13/23 1401  TSH 76.531*  --   T3FREE  --  1.4*   Anemia work up No results for input(s): "VITAMINB12", "FOLATE", "FERRITIN", "TIBC", "IRON", "RETICCTPCT" in the last 72 hours. Urinalysis    Component Value Date/Time   COLORURINE YELLOW 05/13/2023 0806   APPEARANCEUR CLEAR 05/13/2023 0806   LABSPEC 1.005 05/13/2023 0806   PHURINE 6.0 05/13/2023 0806   GLUCOSEU 50 (A) 05/13/2023 0806   HGBUR SMALL (A) 05/13/2023 0806   BILIRUBINUR NEGATIVE 05/13/2023 0806   KETONESUR NEGATIVE 05/13/2023 0806   PROTEINUR NEGATIVE 05/13/2023 0806   NITRITE NEGATIVE 05/13/2023 0806   LEUKOCYTESUR NEGATIVE 05/13/2023 0806   Sepsis Labs Recent Labs  Lab 05/13/23 0616  WBC 4.6   Microbiology Recent Results (from the past 240 hours)  Resp panel by RT-PCR (RSV, Flu A&B, Covid) Anterior Nasal Swab     Status: None   Collection Time: 05/13/23  6:04 AM   Specimen: Anterior Nasal Swab  Result Value Ref Range Status   SARS Coronavirus 2 by RT PCR NEGATIVE NEGATIVE Final    Comment: (NOTE) SARS-CoV-2 target nucleic acids are NOT DETECTED.  The SARS-CoV-2 RNA is generally detectable in upper respiratory specimens during the acute phase of infection. The lowest concentration of SARS-CoV-2 viral copies this assay can detect is 138 copies/mL. A negative result does not preclude SARS-Cov-2 infection and should not be used as the sole basis for treatment or other patient management decisions. A negative result [place] occur with  improper specimen collection/handling, submission of specimen other than nasopharyngeal swab, presence of viral mutation(s) within the areas targeted by this assay, and  inadequate number of viral copies(<138 copies/mL). A negative result must be combined with clinical observations, patient history, and epidemiological information. The expected result is Negative.  Fact Sheet for Patients:  BloggerCourse.com  Fact Sheet for Healthcare Providers:  SeriousBroker.it  This test is no t yet approved or cleared by the [place] FDA and  has been authorized for detection and/or diagnosis of SARS-CoV-2 by FDA under an Emergency Use Authorization (EUA). This EUA will remain  in effect (meaning this test can be used) for the duration of the COVID-19 declaration under Section 564(b)(1) of the Act, 21 U.S.C.section 360bbb-3(b)(1), unless the authorization is terminated  or revoked sooner.       Influenza A by PCR NEGATIVE NEGATIVE Final   Influenza B by PCR NEGATIVE NEGATIVE Final    Comment: (NOTE) The Xpert Xpress SARS-CoV-2/FLU/RSV plus assay is intended as an aid in the diagnosis of influenza from Nasopharyngeal swab specimens and should not be used as a sole basis for treatment. Nasal washings and aspirates are unacceptable for Xpert Xpress SARS-CoV-2/FLU/RSV testing.  Fact Sheet for Patients: BloggerCourse.com  Fact Sheet for Healthcare Providers: SeriousBroker.it  This test is not yet approved or cleared by the [place] FDA and has been authorized for detection and/or diagnosis of SARS-CoV-2 by FDA under an Emergency Use Authorization (EUA). This EUA will remain in effect (meaning this test can be used) for the duration of the COVID-19 declaration under Section 564(b)(1) of the Act, 21 U.S.C. section 360bbb-3(b)(1), unless the authorization is  terminated or revoked.     Resp Syncytial Virus by PCR NEGATIVE NEGATIVE Final    Comment: (NOTE) Fact Sheet for Patients: BloggerCourse.com  Fact Sheet for Healthcare  Providers: SeriousBroker.it  This test is not yet approved or cleared by the [place] FDA and has been authorized for detection and/or diagnosis of SARS-CoV-2 by FDA under an Emergency Use Authorization (EUA). This EUA will remain in effect (meaning this test can be used) for the duration of the COVID-19 declaration under Section 564(b)(1) of the Act, 21 U.S.C. section 360bbb-3(b)(1), unless the authorization is terminated or revoked.  Performed at [facility], [address] [place], [place] [address]    Time coordinating discharge: 30 mins  SIGNED:  Standley Dakins, MD  [facility] 05/14/2023, 1:58 PM How to contact the [facility] Attending or Consulting provider 7A - 7P or covering provider during after hours 7P -7A, for this patient?  Check the care team in [facility] and look for a) attending/consulting TRH provider listed and b) the [facility] team listed Log into [email or web address] and use [place]'s universal password to access. If you do not have the password, please contact the hospital operator. Locate the [facility] provider you are looking for under [facility] and page to a number that you can be directly reached. If you still have difficulty reaching the provider, please page the [facility] (Director on Call) for the Hospitalists listed on amion for assistance.

## 2023-05-14 NOTE — Progress Notes (Signed)
MEDICAL CONSULTATION PROGRESS NOTE   Daniel Crawford  ZOX:[id number] DOB: [birth year] DOA: 05/13/2023 PCP: [facility]   Chief Complaint  Patient presents with   Weakness   Generalized Pain   Shortness of Breath   Level of care:   Brief Admission History:  68 year old male with stage IV Nash cirrhosis, chronic HFpEF, stage IIIb CKD, COPD, splenomegaly, GERD, chronic thrombocytopenia and history of hepatic encephalopathy, hypothyroidism, hypertension, class III obesity, type 2 diabetes mellitus, OSA who was brought in by EMS for generalized weakness and generalized pain.  He reportedly lives alone but reports he has been taking his medications as prescribed.  He woke up with generalized weakness this morning and reports that he was normal 2 days ago.  He reported some pain in his legs and reported fatigue and SOB.  He was evaluated in the ED had a chest x-ray CT head chest abdomen and pelvis.  Imaging positive for new superior endplate compression deformity of L3 without any other acute findings.  Persistent findings of cirrhotic liver and splenomegaly.  ED is planning to treat with pain management, TLSO brace and PT evaluation and social work/care management consultation for possible rehab facility placement.  Of note, some of his lab work demonstrated an elevated TSH of 76.5.  His other labs essentially stable.  His ammonia level is normal and no signs of hepatic encephalopathy.  Medical consultation was requested.   Assessment and Plan:  Acquired Hypothyroidism  Elevated TSH - upon review of medical records, we see that his TSH was 33 in Dec 2024.  - he is grossly underdosed for levothyroxine based on weight - pt says that he had been taking 200 mcg of levothyroxine before his providers reduced the dose to 112 mcg - Open Evidence review reports that dosing levothyroxine for morbid obesity recommend to start with dose of  1.6 mcg/kg/day based on actual body weight and  adjust according to clinical response and serum TSH levels.  - based on weight he should be on 256 mcg  - restarted him back on 200 mcg daily with breakfast, he was counseled that he needs to take this on an empty stomach before breakfast for better absorption, he verbalized understanding.  - I have reconciled his home medications in anticipation of him getting a SNF bed.      Generalized Weakness - PT/OT eval recommending SNF placement and patient is agreeable  - Pt agreeable SNF based on recommendations and requesting something near [place] or [place] if possible   Fall overnight - evaluated by Dr. Estell Harpin  -  no trauma to head or neck, no loss of consciousness - fall precautions - he was moved to be closer to the nursing station  HFpEF - resumed home meds as ordered by ED provider   Stage IV NASH cirrhosis - well compensated - ammonia normal - all liver medications resumed by ED provider   Stage 3a CKD  - stable    New findings of L3 compression fracture -Appropriately treated in ED with TLSO brace and pain management -PT evaluation appreciated.  He remains a high fall risk  -TOC consulted and arranging for SNF   DVT prophylaxis:  Code Status:  Family Communication:  Disposition: SNF facility   Consultants:  PT/OT  Procedures:   Antimicrobials:    Subjective: Pt sitting up and playing on phone, he says that he doesn't like being out in the open in the ED.  He was placed in the hall after a fall  yesterday evening.   Objective: Vitals:   05/13/23 1015 05/13/23 1215 05/13/23 2058 05/14/23 0609  BP: (!) 118/59 130/74 120/71 102/61  Pulse: 71 77 80 77  Resp: 11 15 19 16   Temp: 97.9 F (36.6 C)  98 F (36.7 C) 98.1 F (36.7 C)  TempSrc: Oral  Oral Oral  SpO2: 97% 100% 93% 99%  Weight:      Height:       No intake or output data in the 24 hours ending 05/14/23 0906 Filed Weights   05/13/23 0605  Weight: (!) 160 kg   Examination:  General exam: morbidly  obese male, on gurnee, Appears calm and comfortable  Respiratory system: Clear to auscultation. Respiratory effort normal. Cardiovascular system: normal S1 & S2 heard. No JVD, murmurs, rubs, gallops or clicks. 2++ pedal edema. Gastrointestinal system: Abdomen is nondistended, soft and nontender. No organomegaly or masses felt. Normal bowel sounds heard. Central nervous system: Alert and oriented. No focal neurological deficits. Extremities: Symmetric 5 x 5 power. Skin: No rashes, lesions or ulcers. Psychiatry: Judgement and insight appear normal. Mood & affect appropriate.   Data Reviewed: I have personally reviewed following labs and imaging studies  CBC: Recent Labs  Lab 05/13/23 0616  WBC 4.6  HGB 9.4*  HCT 28.5*  MCV 86.1  PLT 60*    Basic Metabolic Panel: Recent Labs  Lab 05/13/23 0616  NA 130*  K 4.0  CL 96*  CO2 26  GLUCOSE 126*  BUN 25*  CREATININE 2.58*  CALCIUM 8.8*    CBG: No results for input(s): "GLUCAP" in the last 168 hours.  Recent Results (from the past 240 hours)  Resp panel by RT-PCR (RSV, Flu A&B, Covid) Anterior Nasal Swab     Status: None   Collection Time: 05/13/23  6:04 AM   Specimen: Anterior Nasal Swab  Result Value Ref Range Status   SARS Coronavirus 2 by RT PCR NEGATIVE NEGATIVE Final    Comment: (NOTE) SARS-CoV-2 target nucleic acids are NOT DETECTED.  The SARS-CoV-2 RNA is generally detectable in upper respiratory specimens during the acute phase of infection. The lowest concentration of SARS-CoV-2 viral copies this assay can detect is 138 copies/mL. A negative result does not preclude SARS-Cov-2 infection and should not be used as the sole basis for treatment or other patient management decisions. A negative result [place] occur with  improper specimen collection/handling, submission of specimen other than nasopharyngeal swab, presence of viral mutation(s) within the areas targeted by this assay, and inadequate number of  viral copies(<138 copies/mL). A negative result must be combined with clinical observations, patient history, and epidemiological information. The expected result is Negative.  Fact Sheet for Patients:  BloggerCourse.com  Fact Sheet for Healthcare Providers:  SeriousBroker.it  This test is no t yet approved or cleared by the [place] FDA and  has been authorized for detection and/or diagnosis of SARS-CoV-2 by FDA under an Emergency Use Authorization (EUA). This EUA will remain  in effect (meaning this test can be used) for the duration of the COVID-19 declaration under Section 564(b)(1) of the Act, 21 U.S.C.section 360bbb-3(b)(1), unless the authorization is terminated  or revoked sooner.       Influenza A by PCR NEGATIVE NEGATIVE Final   Influenza B by PCR NEGATIVE NEGATIVE Final    Comment: (NOTE) The Xpert Xpress SARS-CoV-2/FLU/RSV plus assay is intended as an aid in the diagnosis of influenza from Nasopharyngeal swab specimens and should not be used as a sole basis for treatment.  Nasal washings and aspirates are unacceptable for Xpert Xpress SARS-CoV-2/FLU/RSV testing.  Fact Sheet for Patients: BloggerCourse.com  Fact Sheet for Healthcare Providers: SeriousBroker.it  This test is not yet approved or cleared by the [place] FDA and has been authorized for detection and/or diagnosis of SARS-CoV-2 by FDA under an Emergency Use Authorization (EUA). This EUA will remain in effect (meaning this test can be used) for the duration of the COVID-19 declaration under Section 564(b)(1) of the Act, 21 U.S.C. section 360bbb-3(b)(1), unless the authorization is terminated or revoked.     Resp Syncytial Virus by PCR NEGATIVE NEGATIVE Final    Comment: (NOTE) Fact Sheet for Patients: BloggerCourse.com  Fact Sheet for Healthcare  Providers: SeriousBroker.it  This test is not yet approved or cleared by the [place] FDA and has been authorized for detection and/or diagnosis of SARS-CoV-2 by FDA under an Emergency Use Authorization (EUA). This EUA will remain in effect (meaning this test can be used) for the duration of the COVID-19 declaration under Section 564(b)(1) of the Act, 21 U.S.C. section 360bbb-3(b)(1), unless the authorization is terminated or revoked.  Performed at [facility], [address] [place], [place] [address]      Radiology Studies: CT CHEST ABDOMEN PELVIS WO CONTRAST Result Date: 05/13/2023 CLINICAL DATA:  Unintended weight loss. Generalized abdominal pain, back pain. Weakness. EXAM: CT CHEST, ABDOMEN AND PELVIS WITHOUT CONTRAST TECHNIQUE: Multidetector CT imaging of the chest, abdomen and pelvis was performed following the standard protocol without IV contrast. RADIATION DOSE REDUCTION: This exam was performed according to the departmental dose-optimization program which includes automated exposure control, adjustment of the mA and/or kV according to patient size and/or use of iterative reconstruction technique. COMPARISON:  CT scan renal stone protocol from 04/23/2023 and CT angiography chest from 01/22/2022. FINDINGS: CT CHEST FINDINGS Cardiovascular: Normal cardiac size. No pericardial effusion. No aortic aneurysm. Mediastinum/Nodes: Visualized thyroid gland appears grossly unremarkable. No solid / cystic mediastinal masses. The esophagus is nondistended precluding optimal assessment. No mediastinal or axillary lymphadenopathy by size criteria. Evaluation of bilateral hila is limited due to lack on intravenous contrast: however, no large hilar lymphadenopathy identified. Lungs/Pleura: The central tracheo-bronchial tree is patent. No mass or consolidation. No pleural effusion or pneumothorax. No suspicious lung nodules. Musculoskeletal: The visualized soft tissues of the  chest wall are grossly unremarkable. No suspicious osseous lesions. There are mild multilevel degenerative changes in the visualized spine. CT ABDOMEN PELVIS FINDINGS Hepatobiliary: The liver is normal in size. There is liver surface irregularity/nodularity, compatible with cirrhosis. No discrete focal mass seen on this unenhanced exam. No intrahepatic or extrahepatic bile duct dilation. Gallbladder is surgically absent. Pancreas: Small/atrophic pancreas.  Not well evaluated on this exam. Spleen: Moderate to markedly enlarged and lobulated spleen measuring up to 13.3 x 20.8 cm orthogonally on coronal plane. No focal lesion seen within the limitations of this unenhanced exam. Adrenals/Urinary Tract: Adrenal glands are unremarkable. No suspicious renal mass within the limitations of this unenhanced exam. There is a 4 mm nonobstructing calculus in the right kidney lower pole calyx and a 1-2 mm nonobstructing calculus in the left kidney interpolar region. No ureterolithiasis or obstructive uropathy on either side. Unremarkable urinary bladder. Stomach/Bowel: No disproportionate dilation of the small or large bowel loops. No evidence of abnormal bowel wall thickening or inflammatory changes. The appendix is unremarkable. Vascular/Lymphatic: There is mild ascites mainly in the perihepatic/perisplenic region. No pneumoperitoneum. There is also fat stranding mainly concentrated around the mesenteric vessels which is also likely secondary to  ascites. No abdominal or pelvic lymphadenopathy, by size criteria. No aneurysmal dilation of the major abdominal arteries. Note is made of prominent venous collaterals near the lower thoracic esophagus, GE junction and in the left upper quadrant, not well evaluated on this exam but favored to represent sequela of portal hypertension. Reproductive: Normal size prostate. Symmetric seminal vesicles. Other: Redemonstration of small-to-moderate periumbilical hernia containing fat and trace  amount of ascitic fluid. There are metallic anchors in the left inguinal region, from prior hernia repair. However there is still small sized fat containing left inguinal hernia. There is moderate anasarca. Musculoskeletal: No suspicious osseous lesions. There are mild - moderate multilevel degenerative changes in the visualized spine. There is new superior endplate compression deformity of L3 vertebral body, when compared to the prior exam from 04/23/2023. No significant retropulsion or spinal canal compromise. IMPRESSION: 1. No discrete neoplastic process identified within the chest, abdomen or pelvis on this unenhanced exam. 2. There is new superior endplate compression deformity of L3 vertebral body, when compared to the prior exam from 04/23/2023. No significant retropulsion or spinal canal compromise. 3. No lung mass, consolidation, pleural effusion or pneumothorax. 4. Cirrhotic liver with sequela of portal hypertension characterized by mild ascites, splenomegaly and venous collaterals. 5. Multiple other nonacute observations, as described above. Electronically Signed   By: Jules Schick M.D.   On: 05/13/2023 09:37   CT Head Wo Contrast Result Date: 05/13/2023 CLINICAL DATA:  Mental status change with unknown cause. EXAM: CT HEAD WITHOUT CONTRAST TECHNIQUE: Contiguous axial images were obtained from the base of the skull through the vertex without intravenous contrast. RADIATION DOSE REDUCTION: This exam was performed according to the departmental dose-optimization program which includes automated exposure control, adjustment of the mA and/or kV according to patient size and/or use of iterative reconstruction technique. COMPARISON:  02/01/2023 FINDINGS: Brain: No evidence of acute infarction, hemorrhage, hydrocephalus, extra-axial collection or mass lesion/mass effect. Vascular: No hyperdense vessel or unexpected calcification. Skull: Normal. Negative for fracture or focal lesion. Sinuses/Orbits: No acute  finding. IMPRESSION: Stable, negative head CT. Electronically Signed   By: Tiburcio Pea M.D.   On: 05/13/2023 09:19   DG Chest Portable 1 View Result Date: 05/13/2023 CLINICAL DATA:  Generalized weakness and pain EXAM: PORTABLE CHEST 1 VIEW COMPARISON:  03/25/2023 FINDINGS: Stable generous heart size. Normal aortic and hilar contours. There is no edema, consolidation, effusion, or pneumothorax. IMPRESSION: Stable exam.  No acute finding. Electronically Signed   By: Tiburcio Pea M.D.   On: 05/13/2023 07:03    Scheduled Meds:  lactulose  30 g Oral TID   levothyroxine  200 mcg Oral QAC breakfast   lidocaine  1 patch Transdermal Q24H   methocarbamol  500 mg Oral TID   pantoprazole  40 mg Oral Daily   rifaximin  550 mg Oral BID   sertraline  100 mg Oral Daily   spironolactone  100 mg Oral Daily   tamsulosin  0.4 mg Oral QPC supper   torsemide  80 mg Oral q morning   And   torsemide  40 mg Oral QPM   traZODone  50 mg Oral QHS   Continuous Infusions:   LOS: 0 days   Time spent: 55 mins  Aisha Greenberger Laural Benes, MD How to contact the [facility] Attending or Consulting provider 7A - 7P or covering provider during after hours 7P -7A, for this patient?  Check the care team in [facility] and look for a) attending/consulting TRH provider listed and b) the [facility] team listed  Log into [email or web address] to find provider on call.  Locate the [facility] provider you are looking for under [facility] and page to a number that you can be directly reached. If you still have difficulty reaching the provider, please page the [facility] (Director on Call) for the Hospitalists listed on amion for assistance.  05/14/2023, 9:06 AM

## 2023-05-14 NOTE — Discharge Instructions (Addendum)
Continue your home medications as prescribed.  Your Synthroid dose has been increased.  Wear back brace when up and out of bed.  Call the telephone number below to set up a 4-week follow-up appointment with neurosurgery for reevaluation of your back injury.  IMPORTANT INFORMATION: PAY CLOSE ATTENTION   PHYSICIAN DISCHARGE INSTRUCTIONS  Follow with Primary care provider  [facility]  and other consultants as instructed by your Hospitalist Physician  SEEK MEDICAL CARE OR RETURN TO EMERGENCY ROOM IF SYMPTOMS COME BACK, WORSEN OR NEW PROBLEM DEVELOPS   Please note: You were cared for by a hospitalist during your hospital stay. Every effort will be made to forward records to your primary care provider.  You can request that your primary care provider send for your hospital records if they have not received them.  Once you are discharged, your primary care physician will handle any further medical issues. Please note that NO REFILLS for any discharge medications will be authorized once you are discharged, as it is imperative that you return to your primary care physician (or establish a relationship with a primary care physician if you do not have one) for your post hospital discharge needs so that they can reassess your need for medications and monitor your lab values.  Please get a complete blood count and chemistry panel checked by your Primary MD at your next visit, and again as instructed by your Primary MD.  Get Medicines reviewed and adjusted: Please take all your medications with you for your next visit with your Primary MD  Laboratory/radiological data: Please request your Primary MD to go over all hospital tests and procedure/radiological results at the follow up, please ask your primary care provider to get all Hospital records sent to his/her office.  In some cases, they will be blood work, cultures and biopsy results pending at the time of your discharge. Please  request that your primary care provider follow up on these results.  If you are diabetic, please bring your blood sugar readings with you to your follow up appointment with primary care.    Please call and make your follow up appointments as soon as possible.    Also Note the following: If you experience worsening of your admission symptoms, develop shortness of breath, life threatening emergency, suicidal or homicidal thoughts you must seek medical attention immediately by calling 911 or calling your MD immediately  if symptoms less severe.  You must read complete instructions/literature along with all the possible adverse reactions/side effects for all the Medicines you take and that have been prescribed to you. Take any new Medicines after you have completely understood and accpet all the possible adverse reactions/side effects.   Do not drive when taking Pain medications or sleeping medications (Benzodiazepines)  Do not take more than prescribed Pain, Sleep and Anxiety Medications. It is not advisable to combine anxiety,sleep and pain medications without talking with your primary care practitioner  Special Instructions: If you have smoked or chewed Tobacco  in the last 2 yrs please stop smoking, stop any regular Alcohol  and or any Recreational drug use.  Wear Seat belts while driving.  Do not drive if taking any narcotic, mind altering or controlled substances or recreational drugs or alcohol.

## 2023-05-14 NOTE — ED Provider Notes (Signed)
Emergency Medicine Observation Re-evaluation Note  Daniel Crawford is a 68 y.o. male, seen on rounds today.  Pt initially presented to the ED for complaints of Weakness, Generalized Pain, and Shortness of Breath Currently, the patient is laying on stretcher, playing on phone.  Physical Exam  BP 102/61 (BP Location: Right Arm)   Pulse 77   Temp 98.1 F (36.7 C) (Oral)   Resp 16   Ht 6\' 2"  (1.88 m)   Wt (!) 160 kg   SpO2 99%   BMI 45.29 kg/m  Physical Exam General: Awake, alert, nondistressed with Cardiac: Extremities well-perfused Lungs: Breathing is unlabored Psych: No agitation  ED Course / MDM  EKG:EKG Interpretation Date/Time:  Monday May 13 2023 06:11:14 EST Ventricular Rate:  78 PR Interval:  222 QRS Duration:  103 QT Interval:  435 QTC Calculation: 466 R Axis:   32  Text Interpretation: Sinus rhythm Paired ventricular premature complexes Borderline prolonged PR interval Low voltage, precordial leads Confirmed by Ross Marcus ([id number]) on 05/13/2023 6:49:43 AM  I have reviewed the labs performed to date as well as medications administered while in observation.  Recent changes in the last 24 hours include attempted to ambulate and fell last night.  Plan  Current plan is for SNF placement.    Gloris Manchester, MD 05/14/23 [phone]

## 2023-05-14 NOTE — ED Notes (Signed)
Spoke with pt's Truddie Coco to give an update; he stated pt has  been calling and texting him all night stating no one has been checking on him and that he is in the hallway;  steve told that pt is in the hallway due to pt fell and we are able to keep a better watch on him if pt is in the hallway;  I also informed steve pt has been pouring his urinal on his self but we have been cleaning him when this occurs; steve verbalized understanding and was appreciative that someone had spoke with him

## 2023-05-14 NOTE — ED Notes (Addendum)
CSW spoke with patient about bed offers. Patient agreeable to CV . CSW reached out to [facility] about starting Auth for CV and Eunice Blase was agreeable with Auth being started. TOC will continue to follow.   Patient Auth came back approved for patient to DC to CV. Debbie made aware and provided room number A8-2 with call report number being [phone]. Nurse notified . MD notified to  update AVS. Patient was also aware of being DC today since Auth came back approved. TOC signing off.

## 2023-05-15 DIAGNOSIS — E039 Hypothyroidism, unspecified: Secondary | ICD-10-CM | POA: Diagnosis not present

## 2023-05-15 DIAGNOSIS — K7581 Nonalcoholic steatohepatitis (NASH): Secondary | ICD-10-CM | POA: Diagnosis not present

## 2023-05-15 DIAGNOSIS — N1832 Chronic kidney disease, stage 3b: Secondary | ICD-10-CM | POA: Diagnosis not present

## 2023-05-15 DIAGNOSIS — S32030A Wedge compression fracture of third lumbar vertebra, initial encounter for closed fracture: Secondary | ICD-10-CM | POA: Diagnosis not present

## 2023-05-17 DIAGNOSIS — K7460 Unspecified cirrhosis of liver: Secondary | ICD-10-CM | POA: Diagnosis not present

## 2023-05-17 DIAGNOSIS — I509 Heart failure, unspecified: Secondary | ICD-10-CM | POA: Diagnosis not present

## 2023-05-17 DIAGNOSIS — G894 Chronic pain syndrome: Secondary | ICD-10-CM | POA: Diagnosis not present

## 2023-05-17 DIAGNOSIS — E039 Hypothyroidism, unspecified: Secondary | ICD-10-CM | POA: Diagnosis not present

## 2023-05-17 DIAGNOSIS — M6281 Muscle weakness (generalized): Secondary | ICD-10-CM | POA: Diagnosis not present

## 2023-05-20 DIAGNOSIS — K7460 Unspecified cirrhosis of liver: Secondary | ICD-10-CM | POA: Diagnosis not present

## 2023-05-20 DIAGNOSIS — I509 Heart failure, unspecified: Secondary | ICD-10-CM | POA: Diagnosis not present

## 2023-05-20 DIAGNOSIS — E039 Hypothyroidism, unspecified: Secondary | ICD-10-CM | POA: Diagnosis not present

## 2023-05-20 DIAGNOSIS — M6281 Muscle weakness (generalized): Secondary | ICD-10-CM | POA: Diagnosis not present

## 2023-05-20 DIAGNOSIS — G894 Chronic pain syndrome: Secondary | ICD-10-CM | POA: Diagnosis not present

## 2023-05-24 ENCOUNTER — Telehealth (HOSPITAL_COMMUNITY): Payer: Self-pay

## 2023-05-24 NOTE — Telephone Encounter (Signed)
 Called to confirm/remind patient of their appointment at the [facility] on 05/27/23.   Patient reminded to bring all medications and/or complete list.  Confirmed patient has transportation. Gave directions, instructed to utilize valet parking.  Confirmed appointment prior to ending call.

## 2023-05-27 ENCOUNTER — Ambulatory Visit (HOSPITAL_COMMUNITY)
Admission: RE | Admit: 2023-05-27 | Discharge: 2023-05-27 | Disposition: A | Discharge status: Home / Self Care | Payer: Medicare HMO | Source: Ambulatory Visit | Attending: Cardiology | Admitting: Cardiology

## 2023-05-27 ENCOUNTER — Encounter (HOSPITAL_COMMUNITY): Payer: Self-pay

## 2023-05-27 VITALS — BP 120/62 | HR 75 | Ht 74.0 in | Wt 337.6 lb

## 2023-05-27 DIAGNOSIS — I272 Pulmonary hypertension, unspecified: Secondary | ICD-10-CM | POA: Insufficient documentation

## 2023-05-27 DIAGNOSIS — E039 Hypothyroidism, unspecified: Secondary | ICD-10-CM | POA: Diagnosis not present

## 2023-05-27 DIAGNOSIS — I130 Hypertensive heart and chronic kidney disease with heart failure and stage 1 through stage 4 chronic kidney disease, or unspecified chronic kidney disease: Secondary | ICD-10-CM | POA: Insufficient documentation

## 2023-05-27 DIAGNOSIS — K7581 Nonalcoholic steatohepatitis (NASH): Secondary | ICD-10-CM | POA: Diagnosis not present

## 2023-05-27 DIAGNOSIS — Z9181 History of falling: Secondary | ICD-10-CM | POA: Insufficient documentation

## 2023-05-27 DIAGNOSIS — D6959 Other secondary thrombocytopenia: Secondary | ICD-10-CM | POA: Diagnosis not present

## 2023-05-27 DIAGNOSIS — E669 Obesity, unspecified: Secondary | ICD-10-CM | POA: Diagnosis not present

## 2023-05-27 DIAGNOSIS — R443 Hallucinations, unspecified: Secondary | ICD-10-CM | POA: Insufficient documentation

## 2023-05-27 DIAGNOSIS — E1122 Type 2 diabetes mellitus with diabetic chronic kidney disease: Secondary | ICD-10-CM | POA: Insufficient documentation

## 2023-05-27 DIAGNOSIS — J449 Chronic obstructive pulmonary disease, unspecified: Secondary | ICD-10-CM | POA: Diagnosis not present

## 2023-05-27 DIAGNOSIS — K7460 Unspecified cirrhosis of liver: Secondary | ICD-10-CM | POA: Diagnosis not present

## 2023-05-27 DIAGNOSIS — Z79890 Hormone replacement therapy (postmenopausal): Secondary | ICD-10-CM | POA: Insufficient documentation

## 2023-05-27 DIAGNOSIS — N184 Chronic kidney disease, stage 4 (severe): Secondary | ICD-10-CM | POA: Diagnosis not present

## 2023-05-27 DIAGNOSIS — I5032 Chronic diastolic (congestive) heart failure: Secondary | ICD-10-CM

## 2023-05-27 DIAGNOSIS — D696 Thrombocytopenia, unspecified: Secondary | ICD-10-CM | POA: Diagnosis not present

## 2023-05-27 DIAGNOSIS — R161 Splenomegaly, not elsewhere classified: Secondary | ICD-10-CM | POA: Diagnosis not present

## 2023-05-27 DIAGNOSIS — Z87891 Personal history of nicotine dependence: Secondary | ICD-10-CM | POA: Insufficient documentation

## 2023-05-27 LAB — BRAIN NATRIURETIC PEPTIDE: B Natriuretic Peptide: 73.6 pg/mL (ref 0.0–100.0)

## 2023-05-27 LAB — COMPREHENSIVE METABOLIC PANEL
ALT: 19 U/L (ref 0–44)
AST: 43 U/L — ABNORMAL HIGH (ref 15–41)
Albumin: 2.7 g/dL — ABNORMAL LOW (ref 3.5–5.0)
Alkaline Phosphatase: 96 U/L (ref 38–126)
Anion gap: 12 (ref 5–15)
BUN: 26 mg/dL — ABNORMAL HIGH (ref 8–23)
CO2: 29 mmol/L (ref 22–32)
Calcium: 9.3 mg/dL (ref 8.9–10.3)
Chloride: 98 mmol/L (ref 98–111)
Creatinine, Ser: 2.52 mg/dL — ABNORMAL HIGH (ref 0.61–1.24)
GFR, Estimated: 28 mL/min — ABNORMAL LOW (ref >60)
Glucose, Bld: 118 mg/dL — ABNORMAL HIGH (ref 70–99)
Potassium: 4.2 mmol/L (ref 3.5–5.1)
Sodium: 139 mmol/L (ref 135–145)
Total Bilirubin: 4.1 mg/dL — ABNORMAL HIGH (ref 0.0–1.2)
Total Protein: 6.3 g/dL — ABNORMAL LOW (ref 6.5–8.1)

## 2023-05-27 LAB — AMMONIA: Ammonia: 77 umol/L — ABNORMAL HIGH (ref 9–35)

## 2023-05-27 LAB — TSH: TSH: 6.781 u[IU]/mL — ABNORMAL HIGH (ref 0.350–4.500)

## 2023-05-27 NOTE — Progress Notes (Addendum)
 ADVANCED [facility] CLINIC NOTE  PCP: [facility] Cardiology: Dr. Diona Browner HF Cardiology: Dr. Shirlee Latch  Chief Complaint: Heart Failure Follow-up  HPI: 68 y.o. with history of NASH cirrhosis stage IV, chronic diastolic CHF, DM2, CKD stage 3, and COPD.   RHC in 10/23 showed mildly elevated filling pressures with pulmonary venous hypertension. Echo in 4/24 showed EF 55%, mild LVH, normal RV systolic function with mild RV enlargement.    Multiple hospitalizations in the past year for CHF exacerbation and hepatic encephalopathy. Most recently admitted 03/25/23-03/27/23, with complaint of weakness and noted to have a lactic acidosis. He was thought to be dehydrated and given IVF. He was restarted on Farxiga and Torsemide significantly decreased to 40 bid.   Seen in ED 05/13/23 for low back pain and weakness. Found to have compression fracture of L3. Also with aquired hypothyroidism, synthroid adjusted. Of note, in ED fell on his face on standing, head CT negative. Discharged to SNF.   Today he returns for HF follow up. Overall feeling pitiful. Reports that he has been feeling very fatigued, it is worse with exertion. Has been having difficulty sleeping, no orthopnea, however nocturnal hallucinations. Reports that he has been having hand flapping and hallucinations. Has not missed any lactulose doses and reports frequent bowel movements. Feels that his fluid has been better. He is down 15 lbs. Working with home PT. He was discharged to SNF, however signed himself out because he was not happy with the care. Denies palpitations, abnormal bleeding, or CP.  No fever or chills.   ReDs reading: 30%, normal  PMH: 1. Cirrhosis: Due to NASH.  Splenomegaly, chronic thrombocytopenia, history of hepatic encephalopathy.  - Liver biopsy in 1/23 with stage IV cirrhosis.  2. Thrombocytopenia: Due to splenomegaly.  3. Hypothyroidism 4. HTN 5. Type 2 diabetes 6. CKD stage 3 7. COPD: Moderate  obstruction on PFTs, prior smoker.  8. HFpEF: RHC (10/23) with mean RA 15, PA 36/20 mean 28, mean PCWP 19, PVR 1.2 WU, CI 2.65.  - Echo (4/24): EF 55%, mild LVH, normal RV systolic function with mild RV enlargement.  - Echo (10/24): EF 55-60, RV nl - RHC (11/24): RA 9, PA 25/17 (21), PCWP 10, CO/CI (fick) 13.6/5.01, PAPi 0.8  SH: Lives in [place], single, quit smoking 10 years ago, no ETOH.   Family History  Problem Relation Age of Onset   Liver cancer Mother    Heart disease Mother    Aneurysm Sister    ROS: All systems reviewed and negative except as per HPI.   Current Outpatient Medications  Medication Sig Dispense Refill   acetaminophen (TYLENOL) 325 MG tablet Take 2 tablets (650 mg total) by mouth every 6 (six) hours as needed for mild pain (pain score 1-3), fever or headache (or Fever >/= 101). 100 tablet 0   albuterol (VENTOLIN HFA) 108 (90 Base) MCG/ACT inhaler Inhale 2 puffs into the lungs every 6 (six) hours as needed for wheezing or shortness of breath. 18 g 2   Cholecalciferol (VITAMIN D3) 1.25 MG (50000 UT) CAPS Take 1 capsule by mouth once a week.     dapagliflozin propanediol (FARXIGA) 10 MG TABS tablet Take 1 tablet (10 mg total) by mouth daily before breakfast. 90 tablet 3   diclofenac Sodium (VOLTAREN) 1 % GEL Apply 2 g topically in the morning and at bedtime. 1-2 inches topically twice daily     hydrOXYzine (VISTARIL) 50 MG capsule Take 1 capsule (50 mg total) by mouth 3 (three)  times daily as needed for anxiety or nausea. 90 capsule 2   lactose free nutrition (BOOST) LIQD Take 237 mLs by mouth every evening.     lactulose (CHRONULAC) 10 GM/15ML solution Take 45 mLs (30 g total) by mouth 2 (two) times daily.     levothyroxine (SYNTHROID) 200 MCG tablet Take 1 tablet (200 mcg total) by mouth daily before breakfast.     lidocaine (LIDODERM) 5 % Place 1 patch onto the skin daily as needed. Remove & Discard patch within 12 hours or as directed by MD     ondansetron (ZOFRAN) 4  MG tablet Take 4 mg by mouth 2 (two) times daily as needed for nausea or vomiting.     pantoprazole (PROTONIX) 40 MG tablet Take 1 tablet (40 mg total) by mouth daily. 90 tablet 3   potassium chloride SA (KLOR-CON M) 20 MEQ tablet Take 2 tablets (40 mEq total) by mouth daily.     rifaximin (XIFAXAN) 550 MG TABS tablet Take 1 tablet (550 mg total) by mouth 2 (two) times daily. 60 tablet 2   sertraline (ZOLOFT) 100 MG tablet Take 100 mg by mouth daily.     spironolactone (ALDACTONE) 100 MG tablet Take 1 tablet (100 mg total) by mouth daily. 90 tablet 2   tamsulosin (FLOMAX) 0.4 MG CAPS capsule Take 1 capsule (0.4 mg total) by mouth daily after supper.     torsemide (DEMADEX) 20 MG tablet Take 4 tablets (80 mg total) by mouth every morning AND 2 tablets (40 mg total) every evening.     traZODone (DESYREL) 50 MG tablet Take 1 tablet (50 mg total) by mouth at bedtime. 90 tablet 3   methocarbamol (ROBAXIN) 500 MG tablet Take 1 tablet (500 mg total) by mouth every 8 (eight) hours as needed for muscle spasms. (Patient not taking: Reported on 05/27/2023)     No current facility-administered medications for this encounter.   Wt Readings from Last 3 Encounters:  05/27/23 (!) 153.1 kg (337 lb 9.6 oz)  05/13/23 (!) 160 kg (352 lb 11.8 oz)  05/06/23 (!) 160.1 kg (353 lb)   BP 120/62   Pulse 75   Ht 6\' 2"  (1.88 m)   Wt (!) 153.1 kg (337 lb 9.6 oz)   SpO2 100%   BMI 43.35 kg/m   PHYSICAL EXAM:  General: Pitiful appearing. Appears chronically ill Cardiac: JV depressed. S1 and S2 present. No murmurs or rub. Resp: Lung sounds clear and equal B/L Abdomen: Obese, taut, non-distended.  Extremities: Warm and dry. No rash, cyanosis. 3+ peripheral edema.  Neuro: Alert and oriented x3. Affect pleasant. Moves all extremities without difficulty.  Assessment/Plan: 1.  Chronic diastolic CHF: RHC in 10/23 with mildly elevated filling pressures and pulmonary venous hypertension.  Echo in 4/24 showed EF 55%, mild  LVH, normal RV systolic function with mild RV enlargement.   - NYHA III-IV. Appears intravascularly dry, with peripheral edema. ReDs 30%.  - Continue torsemide 80 mg in am and 40 mg in pm.  - Continue Spiro 100 mg daily - Continue Farxiga 10 mg daily. No GU or skin s/s - He would be a reasonable Cardiomems candidate. Insurance had denied his appeal.  2. NASH cirrhosis: Stage IV by liver biopsy in 1/23.  Suspect due to NASH. He has h/o hepatic encephalopathy as well as splenomegaly with thrombocytopenia.  - Hand flapping and hallucinations. Will get CMET and ammonia. Reports he is taking his lactulose. - Follows with GI in [place].   3. Thrombocytopenia:  Due to cirrhosis/splenomegaly. Stable/chronic, 60k baseline. Other than bruising, denies any other spontaneous bleeding    4. COPD: No longer smokes.  Moderate obstruction on PFTs.   5. OSA: Mild on sleep study in 08/24 - Does not want to use CPAP  6. Hypothyroid: TSH 33 and T4 0.9. Check TSH today. - On Synthroid per PCP  7. Obesity: he expressed desire to go on a GLP1i  - not a good candidate with h/o pancreatitis - Discussed portion control   8. CKD Stage IV - baseline 2.2-2.5 - BMET today  Follow up in 2-3 weeks with APP. He is at high risk for readmit.  We called [facility] because he is agreeable to visits.   [place] Nola Botkins, NP 05/27/2023

## 2023-05-27 NOTE — Progress Notes (Signed)
 ReDS Vest / Clip - 05/27/23 1400       ReDS Vest / Clip   Station Marker D    Ruler Value 38    ReDS Value Range Low volume    ReDS Actual Value 30

## 2023-05-27 NOTE — Patient Instructions (Signed)
 No change in medications today. Labs today - will call you if abnormal. Return to [facility] Failure [facility] Clinic in 2 - 3 weeks. See below. Please call us at [phone] if any questions or concerns prior to your next visit.

## 2023-05-28 ENCOUNTER — Telehealth (HOSPITAL_COMMUNITY): Payer: Self-pay | Admitting: *Deleted

## 2023-05-28 ENCOUNTER — Other Ambulatory Visit: Payer: Self-pay

## 2023-05-28 ENCOUNTER — Encounter (HOSPITAL_COMMUNITY): Payer: Self-pay | Admitting: Emergency Medicine

## 2023-05-28 ENCOUNTER — Emergency Department (HOSPITAL_COMMUNITY)

## 2023-05-28 ENCOUNTER — Telehealth: Payer: Self-pay

## 2023-05-28 ENCOUNTER — Observation Stay (HOSPITAL_COMMUNITY)
Admission: EM | Admit: 2023-05-28 | Discharge: 2023-05-29 | Disposition: A | Discharge status: Home / Self Care | Attending: Family Medicine | Admitting: Family Medicine

## 2023-05-28 ENCOUNTER — Observation Stay (HOSPITAL_COMMUNITY)

## 2023-05-28 DIAGNOSIS — R0902 Hypoxemia: Secondary | ICD-10-CM | POA: Diagnosis not present

## 2023-05-28 DIAGNOSIS — I10 Essential (primary) hypertension: Secondary | ICD-10-CM | POA: Diagnosis not present

## 2023-05-28 DIAGNOSIS — E1122 Type 2 diabetes mellitus with diabetic chronic kidney disease: Secondary | ICD-10-CM | POA: Diagnosis not present

## 2023-05-28 DIAGNOSIS — K7682 Hepatic encephalopathy: Principal | ICD-10-CM | POA: Insufficient documentation

## 2023-05-28 DIAGNOSIS — Z87891 Personal history of nicotine dependence: Secondary | ICD-10-CM | POA: Diagnosis not present

## 2023-05-28 DIAGNOSIS — E139 Other specified diabetes mellitus without complications: Secondary | ICD-10-CM | POA: Diagnosis not present

## 2023-05-28 DIAGNOSIS — E119 Type 2 diabetes mellitus without complications: Secondary | ICD-10-CM

## 2023-05-28 DIAGNOSIS — Z1152 Encounter for screening for COVID-19: Secondary | ICD-10-CM | POA: Insufficient documentation

## 2023-05-28 DIAGNOSIS — I130 Hypertensive heart and chronic kidney disease with heart failure and stage 1 through stage 4 chronic kidney disease, or unspecified chronic kidney disease: Secondary | ICD-10-CM | POA: Insufficient documentation

## 2023-05-28 DIAGNOSIS — Z7984 Long term (current) use of oral hypoglycemic drugs: Secondary | ICD-10-CM | POA: Insufficient documentation

## 2023-05-28 DIAGNOSIS — I959 Hypotension, unspecified: Secondary | ICD-10-CM | POA: Diagnosis not present

## 2023-05-28 DIAGNOSIS — N1832 Chronic kidney disease, stage 3b: Secondary | ICD-10-CM | POA: Diagnosis not present

## 2023-05-28 DIAGNOSIS — Z79899 Other long term (current) drug therapy: Secondary | ICD-10-CM | POA: Insufficient documentation

## 2023-05-28 DIAGNOSIS — K7290 Hepatic failure, unspecified without coma: Secondary | ICD-10-CM | POA: Diagnosis not present

## 2023-05-28 DIAGNOSIS — K7460 Unspecified cirrhosis of liver: Secondary | ICD-10-CM | POA: Insufficient documentation

## 2023-05-28 DIAGNOSIS — I5032 Chronic diastolic (congestive) heart failure: Secondary | ICD-10-CM | POA: Insufficient documentation

## 2023-05-28 DIAGNOSIS — N183 Chronic kidney disease, stage 3 unspecified: Secondary | ICD-10-CM | POA: Diagnosis not present

## 2023-05-28 DIAGNOSIS — R531 Weakness: Secondary | ICD-10-CM | POA: Diagnosis not present

## 2023-05-28 DIAGNOSIS — J449 Chronic obstructive pulmonary disease, unspecified: Secondary | ICD-10-CM | POA: Diagnosis not present

## 2023-05-28 DIAGNOSIS — R4182 Altered mental status, unspecified: Secondary | ICD-10-CM | POA: Diagnosis not present

## 2023-05-28 DIAGNOSIS — E039 Hypothyroidism, unspecified: Secondary | ICD-10-CM | POA: Insufficient documentation

## 2023-05-28 DIAGNOSIS — Z743 Need for continuous supervision: Secondary | ICD-10-CM | POA: Diagnosis not present

## 2023-05-28 LAB — RESP PANEL BY RT-PCR (RSV, FLU A&B, COVID)  RVPGX2
Influenza A by PCR: NEGATIVE
Influenza B by PCR: NEGATIVE
Resp Syncytial Virus by PCR: NEGATIVE
SARS Coronavirus 2 by RT PCR: NEGATIVE

## 2023-05-28 LAB — URINALYSIS, ROUTINE W REFLEX MICROSCOPIC
Bacteria, UA: NONE SEEN
Bilirubin Urine: NEGATIVE
Glucose, UA: 50 mg/dL — AB
Ketones, ur: NEGATIVE mg/dL
Leukocytes,Ua: NEGATIVE
Nitrite: NEGATIVE
Protein, ur: NEGATIVE mg/dL
Specific Gravity, Urine: 1.008 (ref 1.005–1.030)
pH: 6 (ref 5.0–8.0)

## 2023-05-28 LAB — CBC WITH DIFFERENTIAL/PLATELET
Abs Immature Granulocytes: 0.02 10*3/uL (ref 0.00–0.07)
Basophils Absolute: 0 10*3/uL (ref 0.0–0.1)
Basophils Relative: 1 %
Eosinophils Absolute: 0.2 10*3/uL (ref 0.0–0.5)
Eosinophils Relative: 6 %
HCT: 28.2 % — ABNORMAL LOW (ref 39.0–52.0)
Hemoglobin: 8.9 g/dL — ABNORMAL LOW (ref 13.0–17.0)
Immature Granulocytes: 1 %
Lymphocytes Relative: 23 %
Lymphs Abs: 0.8 10*3/uL (ref 0.7–4.0)
MCH: 27.9 pg (ref 26.0–34.0)
MCHC: 31.6 g/dL (ref 30.0–36.0)
MCV: 88.4 fL (ref 80.0–100.0)
Monocytes Absolute: 0.5 10*3/uL (ref 0.1–1.0)
Monocytes Relative: 15 %
Neutro Abs: 1.9 10*3/uL (ref 1.7–7.7)
Neutrophils Relative %: 54 %
Platelets: 70 10*3/uL — ABNORMAL LOW (ref 150–400)
RBC: 3.19 MIL/uL — ABNORMAL LOW (ref 4.22–5.81)
RDW: 19.2 % — ABNORMAL HIGH (ref 11.5–15.5)
Smear Review: DECREASED
WBC: 3.4 10*3/uL — ABNORMAL LOW (ref 4.0–10.5)
nRBC: 0 % (ref 0.0–0.2)

## 2023-05-28 LAB — COMPREHENSIVE METABOLIC PANEL
ALT: 19 U/L (ref 0–44)
AST: 43 U/L — ABNORMAL HIGH (ref 15–41)
Albumin: 2.7 g/dL — ABNORMAL LOW (ref 3.5–5.0)
Alkaline Phosphatase: 96 U/L (ref 38–126)
Anion gap: 10 (ref 5–15)
BUN: 29 mg/dL — ABNORMAL HIGH (ref 8–23)
CO2: 30 mmol/L (ref 22–32)
Calcium: 9.2 mg/dL (ref 8.9–10.3)
Chloride: 94 mmol/L — ABNORMAL LOW (ref 98–111)
Creatinine, Ser: 2.23 mg/dL — ABNORMAL HIGH (ref 0.61–1.24)
GFR, Estimated: 32 mL/min — ABNORMAL LOW (ref >60)
Glucose, Bld: 92 mg/dL (ref 70–99)
Potassium: 3.9 mmol/L (ref 3.5–5.1)
Sodium: 134 mmol/L — ABNORMAL LOW (ref 135–145)
Total Bilirubin: 4.2 mg/dL — ABNORMAL HIGH (ref 0.0–1.2)
Total Protein: 6.3 g/dL — ABNORMAL LOW (ref 6.5–8.1)

## 2023-05-28 LAB — PROTIME-INR
INR: 1.5 — ABNORMAL HIGH (ref 0.8–1.2)
Prothrombin Time: 18.5 s — ABNORMAL HIGH (ref 11.4–15.2)

## 2023-05-28 LAB — LACTIC ACID, PLASMA
Lactic Acid, Venous: 2.4 mmol/L (ref 0.5–1.9)
Lactic Acid, Venous: 2.2 mmol/L (ref 0.5–1.9)

## 2023-05-28 LAB — LIPASE, BLOOD: Lipase: 41 U/L (ref 11–51)

## 2023-05-28 LAB — AMMONIA: Ammonia: 64 umol/L — ABNORMAL HIGH (ref 9–35)

## 2023-05-28 LAB — GLUCOSE, CAPILLARY: Glucose-Capillary: 154 mg/dL — ABNORMAL HIGH (ref 70–99)

## 2023-05-28 MED ORDER — SPIRONOLACTONE 25 MG PO TABS
100.0000 mg | ORAL_TABLET | Freq: Every day | ORAL | Status: DC
  Administered 2023-05-29: 100 mg via ORAL
  Filled 2023-05-28: qty 4

## 2023-05-28 MED ORDER — LACTULOSE 10 GM/15ML PO SOLN
30.0000 g | Freq: Three times a day (TID) | ORAL | Status: DC
  Administered 2023-05-29: 30 g via ORAL
  Filled 2023-05-28 (×2): qty 60

## 2023-05-28 MED ORDER — LACTULOSE 10 GM/15ML PO SOLN
30.0000 g | Freq: Once | ORAL | Status: AC
  Administered 2023-05-28: 30 g via ORAL
  Filled 2023-05-28: qty 60

## 2023-05-28 MED ORDER — LACTULOSE 10 GM/15ML PO SOLN
30.0000 g | Freq: Three times a day (TID) | ORAL | Status: DC
  Administered 2023-05-28: 30 g via ORAL

## 2023-05-28 MED ORDER — TRAZODONE HCL 50 MG PO TABS
50.0000 mg | ORAL_TABLET | Freq: Every day | ORAL | Status: DC
  Administered 2023-05-28: 50 mg via ORAL
  Filled 2023-05-28: qty 1

## 2023-05-28 MED ORDER — RIFAXIMIN 550 MG PO TABS
550.0000 mg | ORAL_TABLET | Freq: Two times a day (BID) | ORAL | Status: DC
  Administered 2023-05-28 – 2023-05-29 (×2): 550 mg via ORAL
  Filled 2023-05-28 (×2): qty 1

## 2023-05-28 MED ORDER — SERTRALINE HCL 50 MG PO TABS
100.0000 mg | ORAL_TABLET | Freq: Every day | ORAL | Status: DC
  Administered 2023-05-29: 100 mg via ORAL
  Filled 2023-05-28: qty 2

## 2023-05-28 MED ORDER — METOCLOPRAMIDE HCL 5 MG/ML IJ SOLN
10.0000 mg | Freq: Once | INTRAMUSCULAR | Status: AC
  Administered 2023-05-28: 10 mg via INTRAVENOUS
  Filled 2023-05-28: qty 2

## 2023-05-28 MED ORDER — INSULIN ASPART 100 UNIT/ML IJ SOLN
0.0000 [IU] | Freq: Three times a day (TID) | INTRAMUSCULAR | Status: DC
  Administered 2023-05-29: 1 [IU] via SUBCUTANEOUS

## 2023-05-28 MED ORDER — LEVOTHYROXINE SODIUM 75 MCG PO TABS
175.0000 ug | ORAL_TABLET | Freq: Every day | ORAL | Status: DC
  Administered 2023-05-29: 175 ug via ORAL
  Filled 2023-05-28: qty 1

## 2023-05-28 MED ORDER — TORSEMIDE 20 MG PO TABS
40.0000 mg | ORAL_TABLET | Freq: Every evening | ORAL | Status: DC
  Administered 2023-05-28: 40 mg via ORAL
  Filled 2023-05-28: qty 2

## 2023-05-28 MED ORDER — ALBUTEROL SULFATE (2.5 MG/3ML) 0.083% IN NEBU: 2.5000 mg | INHALATION_SOLUTION | Freq: Four times a day (QID) | RESPIRATORY_TRACT | Status: DC | PRN

## 2023-05-28 MED ORDER — ONDANSETRON HCL 4 MG PO TABS: 4.0000 mg | ORAL_TABLET | Freq: Four times a day (QID) | ORAL | Status: DC | PRN

## 2023-05-28 MED ORDER — PANTOPRAZOLE SODIUM 40 MG PO TBEC
40.0000 mg | DELAYED_RELEASE_TABLET | Freq: Every day | ORAL | Status: DC
  Administered 2023-05-29: 40 mg via ORAL
  Filled 2023-05-28: qty 1

## 2023-05-28 MED ORDER — INSULIN ASPART 100 UNIT/ML IJ SOLN: 0.0000 [IU] | Freq: Every day | INTRAMUSCULAR | Status: DC

## 2023-05-28 MED ORDER — TRAMADOL HCL 50 MG PO TABS
50.0000 mg | ORAL_TABLET | Freq: Once | ORAL | Status: AC
  Administered 2023-05-28: 50 mg via ORAL
  Filled 2023-05-28: qty 1

## 2023-05-28 MED ORDER — TAMSULOSIN HCL 0.4 MG PO CAPS: 0.4000 mg | ORAL_CAPSULE | Freq: Every day | ORAL | Status: DC

## 2023-05-28 MED ORDER — HYDROXYZINE PAMOATE 50 MG PO CAPS: 50.0000 mg | ORAL_CAPSULE | Freq: Three times a day (TID) | ORAL | Status: DC | PRN

## 2023-05-28 MED ORDER — TORSEMIDE 20 MG PO TABS
80.0000 mg | ORAL_TABLET | Freq: Every morning | ORAL | Status: DC
  Administered 2023-05-29: 80 mg via ORAL
  Filled 2023-05-28: qty 4

## 2023-05-28 MED ORDER — ONDANSETRON HCL 4 MG/2ML IJ SOLN: 4.0000 mg | Freq: Four times a day (QID) | INTRAMUSCULAR | Status: DC | PRN

## 2023-05-28 NOTE — Assessment & Plan Note (Addendum)
 Presenting with altered mental status-confusion, weakness.  Ammonia level 77 yesterday, 64 today.  Head CT negative for acute abnormality.  UA not suggestive of infection.  Afebrile without leukocytosis.  Reports compliance with lactulose 30 mg twice daily, rifaximin.  Reports 2 bowel movements daily. -Obtain chest x-ray. -Lactulose 30 g given in ED, increase home lactulose 30 mg to 3 times daily dosing.  Would probably benefit from at least 3 bowel movements daily. -Resume home rifaximin -Lactic acid 2.4 likely from liver disease, trend for now

## 2023-05-28 NOTE — Telephone Encounter (Signed)
 Called patient per Catalina Antigua, NP with following lab results and instructions:  "Renal function stable. Ammonia elevate to 77. Please take an additional lactulose and call GI office"  Pt verbalized understanding of same.

## 2023-05-28 NOTE — ED Notes (Signed)
 Pt talking on phone with family at this time

## 2023-05-28 NOTE — Assessment & Plan Note (Signed)
 Controlled.  A1c 5.6. ?  Tight control. -Hold Farxiga - SSI- S

## 2023-05-28 NOTE — ED Notes (Signed)
 Patient transported to CT

## 2023-05-28 NOTE — Assessment & Plan Note (Signed)
 Complicated by encephalopathy and thrombocytopenia.  Platelets 70-baseline.  INR 1.5.  Hemoglobin stable.

## 2023-05-28 NOTE — ED Provider Notes (Signed)
 [place] EMERGENCY DEPARTMENT AT [facility] Provider Note   CSN: [id number] Arrival date & time: 05/28/23  1317     History {Add pertinent medical, surgical, social history, OB history to HPI:1} Chief Complaint  Patient presents with   Elevated Daniel Crawford is a 68 y.o. male.  HPI Patient presents for generalized weakness.  Medical history includes HTN, anemia, GERD, COPD, cirrhosis, depression, anxiety, hypothyroidism, CHF, CKD, prediabetes.  He was last seen in the ED a month ago.  This was following a fall at home.  He had a L3 compression fracture and was discharged to skilled nursing facility.  He was at the facility for approximately 1 week.  He has since returned home and has been working with home PT.  He has had a recent progressive generalized weakness, fatigue.  He was seen by heart failure clinic yesterday.  He was noted to have asterixis at the time.  He also endorsed hallucinations.  He reports adherence to his home lactulose, but did not take any today.  Ammonia level from yesterday was elevated at 77.  Heart failure clinic called him to inform him of this lab result and advised him to come to the ED.  TSH from yesterday was improved from prior lab work.  When working with occupational therapy today, patient had worsened weakness and confusion.  He currently denies any areas of discomfort.    Home Medications Prior to Admission medications   Medication Sig Start Date End Date Taking? Authorizing Provider  acetaminophen (TYLENOL) 325 MG tablet Take 2 tablets (650 mg total) by mouth every 6 (six) hours as needed for mild pain (pain score 1-3), fever or headache (or Fever >/= 101). 02/08/23   Shon Hale, MD  albuterol (VENTOLIN HFA) 108 (90 Base) MCG/ACT inhaler Inhale 2 puffs into the lungs every 6 (six) hours as needed for wheezing or shortness of breath. 02/08/23   Shon Hale, MD  Cholecalciferol (VITAMIN D3) 1.25 MG (50000 UT) CAPS Take 1 capsule  by mouth once a week. 03/06/23   [provider]  dapagliflozin propanediol (FARXIGA) 10 MG TABS tablet Take 1 tablet (10 mg total) by mouth daily before breakfast. 02/08/23   Shon Hale, MD  diclofenac Sodium (VOLTAREN) 1 % GEL Apply 2 g topically in the morning and at bedtime. 1-2 inches topically twice daily 03/06/23   [provider]  hydrOXYzine (VISTARIL) 50 MG capsule Take 1 capsule (50 mg total) by mouth 3 (three) times daily as needed for anxiety or nausea. 02/08/23   Shon Hale, MD  lactose free nutrition (BOOST) LIQD Take 237 mLs by mouth every evening.    [provider]  lactulose (CHRONULAC) 10 GM/15ML solution Take 45 mLs (30 g total) by mouth 2 (two) times daily. 05/14/23   Cleora Fleet, MD  levothyroxine (SYNTHROID) 200 MCG tablet Take 1 tablet (200 mcg total) by mouth daily before breakfast. 05/14/23   Johnson, Clanford L, MD  lidocaine (LIDODERM) 5 % Place 1 patch onto the skin daily as needed. Remove & Discard patch within 12 hours or as directed by MD 05/14/23   Cleora Fleet, MD  methocarbamol (ROBAXIN) 500 MG tablet Take 1 tablet (500 mg total) by mouth every 8 (eight) hours as needed for muscle spasms. Patient not taking: Reported on 05/27/2023 05/14/23   Cleora Fleet, MD  ondansetron (ZOFRAN) 4 MG tablet Take 4 mg by mouth 2 (two) times daily as needed for nausea or vomiting.  10/28/22   [provider]  pantoprazole (PROTONIX) 40 MG tablet Take 1 tablet (40 mg total) by mouth daily. 01/25/22   Shon Hale, MD  potassium chloride SA (KLOR-CON M) 20 MEQ tablet Take 2 tablets (40 mEq total) by mouth daily. 05/14/23   Johnson, Clanford L, MD  rifaximin (XIFAXAN) 550 MG TABS tablet Take 1 tablet (550 mg total) by mouth 2 (two) times daily. 02/08/23   Shon Hale, MD  sertraline (ZOLOFT) 100 MG tablet Take 100 mg by mouth daily. 09/24/22   [provider]  spironolactone (ALDACTONE) 100 MG tablet Take 1  tablet (100 mg total) by mouth daily. 02/08/23   Shon Hale, MD  tamsulosin (FLOMAX) 0.4 MG CAPS capsule Take 1 capsule (0.4 mg total) by mouth daily after supper. 03/27/23   Vassie Loll, MD  torsemide (DEMADEX) 20 MG tablet Take 4 tablets (80 mg total) by mouth every morning AND 2 tablets (40 mg total) every evening. 05/06/23   Clegg, Amy D, NP  traZODone (DESYREL) 50 MG tablet Take 1 tablet (50 mg total) by mouth at bedtime. 02/08/23   Shon Hale, MD      Allergies    Patient has no known allergies.    Review of Systems   Review of Systems  Constitutional:  Positive for fatigue.  Neurological:  Positive for weakness (Generalized).  Psychiatric/Behavioral:  Positive for confusion and hallucinations.   All other systems reviewed and are negative.   Physical Exam Updated Vital Signs BP 119/75   Pulse 60   Temp 98 F (36.7 C) (Oral)   Resp 14   Ht 6\' 2"  (1.88 m)   Wt (!) 154 kg   SpO2 99%   BMI 43.59 kg/m  Physical Exam Vitals and nursing note reviewed.  Constitutional:      General: He is not in acute distress.    Appearance: Normal appearance. He is well-developed. He is not ill-appearing, toxic-appearing or diaphoretic.  HENT:     Head: Normocephalic and atraumatic.     Right Ear: External ear normal.     Left Ear: External ear normal.     Nose: Nose normal.     Mouth/Throat:     Mouth: Mucous membranes are moist.  Eyes:     Extraocular Movements: Extraocular movements intact.     Conjunctiva/sclera: Conjunctivae normal.  Cardiovascular:     Rate and Rhythm: Normal rate and regular rhythm.     Heart sounds: No murmur heard. Pulmonary:     Effort: Pulmonary effort is normal. No respiratory distress.     Breath sounds: Normal breath sounds. No wheezing or rales.  Abdominal:     General: There is distension.     Palpations: Abdomen is soft.     Tenderness: There is no abdominal tenderness. There is no guarding or rebound.  Musculoskeletal:         General: No swelling.     Cervical back: Normal range of motion and neck supple.     Right lower leg: Edema present.     Left lower leg: Edema present.  Skin:    General: Skin is warm and dry.     Coloration: Skin is not jaundiced or pale.  Neurological:     General: No focal deficit present.     Mental Status: He is alert and oriented to person, place, and time.  Psychiatric:        Mood and Affect: Mood normal.        Behavior: Behavior  normal.     ED Results / Procedures / Treatments   Labs (all labs ordered are listed, but only abnormal results are displayed) Labs Reviewed  COMPREHENSIVE METABOLIC PANEL - Abnormal; Notable for the following components:      Result Value   Sodium 134 (*)    Chloride 94 (*)    BUN 29 (*)    Creatinine, Ser 2.23 (*)    Total Protein 6.3 (*)    Albumin 2.7 (*)    AST 43 (*)    Total Bilirubin 4.2 (*)    GFR, Estimated 32 (*)    All other components within normal limits  CBC WITH DIFFERENTIAL/PLATELET - Abnormal; Notable for the following components:   WBC 3.4 (*)    RBC 3.19 (*)    Hemoglobin 8.9 (*)    HCT 28.2 (*)    RDW 19.2 (*)    Platelets 70 (*)    All other components within normal limits  PROTIME-INR - Abnormal; Notable for the following components:   Prothrombin Time 18.5 (*)    INR 1.5 (*)    All other components within normal limits  AMMONIA - Abnormal; Notable for the following components:   Ammonia 64 (*)    All other components within normal limits  RESP PANEL BY RT-PCR (RSV, FLU A&B, COVID)  RVPGX2  LIPASE, BLOOD  LACTIC ACID, PLASMA  LACTIC ACID, PLASMA  URINALYSIS, ROUTINE W REFLEX MICROSCOPIC    EKG EKG Interpretation Date/Time:  Tuesday May 28 2023 14:47:05 EST Ventricular Rate:  59 PR Interval:  193 QRS Duration:  115 QT Interval:  485 QTC Calculation: 481 R Axis:   27  Text Interpretation: Sinus rhythm Nonspecific intraventricular conduction delay Low voltage, precordial leads Consider anterior  infarct No significant change since last tracing 2/25 Confirmed by Meridee Score [phone]) on 05/28/2023 3:00:02 PM  Radiology No results found.  Procedures Procedures  {Document cardiac monitor, telemetry assessment procedure when appropriate:1}  Medications Ordered in ED Medications - No data to display  ED Course/ Medical Decision Making/ A&P   {   Click here for ABCD2, HEART and other calculatorsREFRESH Note before signing :1}                              Medical Decision Making Risk Prescription drug management.   This patient presents to the ED for concern of generalized weakness, this involves an extensive number of treatment options, and is a complaint that carries with it a high risk of complications and morbidity.  The differential diagnosis includes deconditioning, polypharmacy, hepatic encephalopathy, other metabolic derangements, anemia, infection   Co morbidities that complicate the patient evaluation  HTN, anemia, GERD, COPD, cirrhosis, depression, anxiety, hypothyroidism, CHF, CKD, prediabetes   Additional history obtained:  Additional history obtained from N/A External records from outside source obtained and reviewed including EMR   Lab Tests:  I Ordered, and personally interpreted labs.  The pertinent results include: Baseline pancytopenia, baseline creatinine, baseline hyperbilirubinemia.  Elevated ammonia and lactic acidosis are present.  Lab work is otherwise unremarkable.   Imaging Studies ordered:  I ordered imaging studies including ***  I independently visualized and interpreted imaging which showed *** I agree with the radiologist interpretation   Cardiac Monitoring: / EKG:  The patient was maintained on a cardiac monitor.  I personally viewed and interpreted the cardiac monitored which showed an underlying rhythm of: Sinus rhythm   Consultations Obtained:  I  requested consultation with the ***,  and discussed lab and imaging findings as  well as pertinent plan - they recommend: ***   Problem List / ED Course / Critical interventions / Medication management  Patient presents for worsening generalized weakness over the past several days.  He also also had recent confusion and hallucinations.  He had outpatient lab work done yesterday which did show elevated ammonia level.  On arrival in the ED, vital signs are normal.  Patient is alert and oriented.  He does have presence of asterixis on exam.  Lab work today confirms hyperammonemia.  Recent symptoms are consistent with hepatic encephalopathy.  Dose of lactulose ordered.  Given his history of cirrhosis and pancytopenia, patient is also at risk of bleeding.  Will check CT head.***. I ordered medication including ***  for ***  Reevaluation of the patient after these medicines showed that the patient {resolved/improved/worsened:23923::"improved"} I have reviewed the patients home medicines and have made adjustments as needed   Social Determinants of Health:  ***   Test / Admission - Considered:  ***   {Document critical care time when appropriate:1} {Document review of labs and clinical decision tools ie heart score, Chads2Vasc2 etc:1}  {Document your independent review of radiology images, and any outside records:1} {Document your discussion with family members, caretakers, and with consultants:1} {Document social determinants of health affecting pt's care:1} {Document your decision making why or why not admission, treatments were needed:1} Final Clinical Impression(s) / ED Diagnoses Final diagnoses:  None    Rx / DC Orders ED Discharge Orders     None

## 2023-05-28 NOTE — ED Notes (Signed)
 Pt standing beside bed to use urinal. Bsc now at bedside.

## 2023-05-28 NOTE — Assessment & Plan Note (Signed)
Stable.  Resume home regimen 

## 2023-05-28 NOTE — Assessment & Plan Note (Signed)
 Stable.  Creatinine at baseline 2.23.

## 2023-05-28 NOTE — ED Notes (Signed)
 Pt assisted back to bed

## 2023-05-28 NOTE — Assessment & Plan Note (Signed)
 Stable. -Resume torsemide

## 2023-05-28 NOTE — Assessment & Plan Note (Signed)
 Trace bilateral lower extremity swelling, appears mostly chronic.  Last echo 12/2022 EF of 55 to 60%.  Reports compliance with torsemide. -Resume home torsemide 40/80 mg twice daily

## 2023-05-28 NOTE — ED Triage Notes (Signed)
 Pt bib [facility] for increased weakness over the last few days. Pt has elevated ammonia per home health nurse and told pt to come to ED. Hx of cirrhosis.

## 2023-05-28 NOTE — Telephone Encounter (Signed)
 FYI;  Pt phoned [facility] stating his ammonia levels were high, but the pt is being elevated in the ED as we speak.

## 2023-05-28 NOTE — ED Notes (Signed)
 Pt returned from CT

## 2023-05-28 NOTE — ED Notes (Signed)
Pt complains of nausea. Provider notified.

## 2023-05-28 NOTE — H&P (Signed)
 History and Physical    Daniel Crawford ZOX:[id number] DOB: 05-15-1955 DOA: 05/28/2023  PCP: Elmer Picker [facility]   Patient coming from: Home  I have personally briefly reviewed patient's old medical records in [facility]  Chief Complaint: Weakness, confusion  HPI: Daniel Crawford is a 68 y.o. male with medical history significant for NASH liver cirrhosis, diastolic CHF, COPD, hypertension, diabetes mellitus, depression and anxiety. Patient presented to the ED with complaints of generalized weakness, confusion.  Patient reports he fell last night and was unable to get up he was able to call for help.  He believes he hit his head on the nightstand.  He does not remember why he fell.  But he reports dizziness today, generalized weakness while working with his physical therapist, with some confusion. He also reports some mild swelling to his bilateral lower extremity over the past 2 to 3 days.  Reports good oral intake, no vomiting, reports some generalized mild abdominal pain, no difficulty breathing no chest pain no cough..  Reports compliance with his torsemide, rifaximin, and lactulose with 2 bowel movements daily.  Patient had ammonia levels checked yesterday as outpatient, it was elevated at 77, with confusion today he was told to come to the ED.  ED Course: Temperature 98.  Heart rate mostly 60s.  Respiratory rate 12-19.  Blood pressure systolic 91-119.  O2 sats greater than 95% on room air. Ammonia level 64.  Platelets low 70.  Lactic acid 2.4.  COVID influenza RSV negative.  UA not suggestive of UTI.   Head CT negative for acute abnormality. Lactulose given.  Hospitalist to admit for hepatic encephalopathy.  Review of Systems: As per HPI all other systems reviewed and negative.  Past Medical History:  Diagnosis Date   Acute pancreatitis 10/02/2022   Anasarca 01/23/2022   Anemia    Anxiety    Asthma    CHF (congestive heart failure) (HCC)    CKD (chronic kidney disease)     D-dimer, elevated 01/22/2022   Decompensation of cirrhosis of liver (HCC) 05/21/2022   Depression    Diabetes mellitus without complication (HCC)    Dyspnea    Hepatic encephalopathy (HCC) 07/10/2022   Hypertension    Hypothyroidism    Liver cirrhosis secondary to NASH (HCC)    NASH (nonalcoholic steatohepatitis)    Other ascites 06/19/2019   Pancytopenia (HCC) 05/21/2022   Pre-diabetes    Spleen enlarged    Thrombocytopenia (HCC)     Past Surgical History:  Procedure Laterality Date   Bilateral hernia surgery     2006, 2007   CHOLECYSTECTOMY     2016    COLONOSCOPY WITH PROPOFOL N/A 06/28/2021   Procedure: COLONOSCOPY WITH PROPOFOL;  Surgeon: Malissa Hippo, MD;  Location: [facility];  Service: Endoscopy;  Laterality: N/A;  1020   ESOPHAGEAL DILATION N/A 11/06/2019   Procedure: ESOPHAGEAL DILATION;  Surgeon: Dolores Frame, MD;  Location: [facility];  Service: Gastroenterology;  Laterality: N/A;   ESOPHAGOGASTRODUODENOSCOPY (EGD) WITH PROPOFOL N/A 11/06/2019   Procedure: ESOPHAGOGASTRODUODENOSCOPY (EGD) WITH PROPOFOL;  Surgeon: Dolores Frame, MD;  Location: [facility];  Service: Gastroenterology;  Laterality: N/A;  1045   IR RADIOLOGIST EVAL & MGMT  02/19/2022   LAPAROSCOPIC ASSISTED VENTRAL HERNIA REPAIR     Polyp removed     in January of 2018.    POLYPECTOMY  06/28/2021   Procedure: POLYPECTOMY INTESTINAL;  Surgeon: Malissa Hippo, MD;  Location: [facility];  Service: Endoscopy;;  RIGHT HEART CATH N/A 01/08/2022   Procedure: RIGHT HEART CATH;  Surgeon: Corky Crafts, MD;  Location: [facility] INVASIVE CV LAB;  Service: Cardiovascular;  Laterality: N/A;   RIGHT HEART CATH N/A 01/29/2023   Procedure: RIGHT HEART CATH;  Surgeon: Laurey Morale, MD;  Location: [facility] INVASIVE CV LAB;  Service: Cardiovascular;  Laterality: N/A;     reports that he quit smoking about 8 years ago. His smoking use included cigarettes. He started smoking about 28  years ago. He has a 20 pack-year smoking history. He has been exposed to tobacco smoke. He has never used smokeless tobacco. He reports that he does not drink alcohol and does not use drugs.  No Known Allergies  Family History  Problem Relation Age of Onset   Liver cancer Mother    Heart disease Mother    Aneurysm Sister    Prior to Admission medications   Medication Sig Start Date End Date Taking? Authorizing Provider  levothyroxine (SYNTHROID) 175 MCG tablet Take 175 mcg by mouth daily. 05/24/23  Yes [provider]  acetaminophen (TYLENOL) 325 MG tablet Take 2 tablets (650 mg total) by mouth every 6 (six) hours as needed for mild pain (pain score 1-3), fever or headache (or Fever >/= 101). 02/08/23   Shon Hale, MD  albuterol (VENTOLIN HFA) 108 (90 Base) MCG/ACT inhaler Inhale 2 puffs into the lungs every 6 (six) hours as needed for wheezing or shortness of breath. 02/08/23   Shon Hale, MD  Cholecalciferol (VITAMIN D3) 1.25 MG (50000 UT) CAPS Take 1 capsule by mouth once a week. 03/06/23   [provider]  dapagliflozin propanediol (FARXIGA) 10 MG TABS tablet Take 1 tablet (10 mg total) by mouth daily before breakfast. 02/08/23   Shon Hale, MD  diclofenac Sodium (VOLTAREN) 1 % GEL Apply 2 g topically in the morning and at bedtime. 1-2 inches topically twice daily 03/06/23   [provider]  hydrOXYzine (VISTARIL) 50 MG capsule Take 1 capsule (50 mg total) by mouth 3 (three) times daily as needed for anxiety or nausea. 02/08/23   Shon Hale, MD  lactose free nutrition (BOOST) LIQD Take 237 mLs by mouth every evening.    [provider]  lactulose (CHRONULAC) 10 GM/15ML solution Take 45 mLs (30 g total) by mouth 2 (two) times daily. 05/14/23   Johnson, Clanford L, MD  lidocaine (LIDODERM) 5 % Place 1 patch onto the skin daily as needed. Remove & Discard patch within 12 hours or as directed by MD 05/14/23   Cleora Fleet, MD   methocarbamol (ROBAXIN) 500 MG tablet Take 1 tablet (500 mg total) by mouth every 8 (eight) hours as needed for muscle spasms. Patient not taking: Reported on 05/27/2023 05/14/23   Cleora Fleet, MD  ondansetron (ZOFRAN) 4 MG tablet Take 4 mg by mouth 2 (two) times daily as needed for nausea or vomiting. 10/28/22   [provider]  pantoprazole (PROTONIX) 40 MG tablet Take 1 tablet (40 mg total) by mouth daily. 01/25/22   Shon Hale, MD  potassium chloride SA (KLOR-CON M) 20 MEQ tablet Take 2 tablets (40 mEq total) by mouth daily. 05/14/23   Johnson, Clanford L, MD  rifaximin (XIFAXAN) 550 MG TABS tablet Take 1 tablet (550 mg total) by mouth 2 (two) times daily. 02/08/23   Shon Hale, MD  sertraline (ZOLOFT) 100 MG tablet Take 100 mg by mouth daily. 09/24/22   [provider]  spironolactone (ALDACTONE) 100 MG tablet Take 1  tablet (100 mg total) by mouth daily. 02/08/23   Shon Hale, MD  tamsulosin (FLOMAX) 0.4 MG CAPS capsule Take 1 capsule (0.4 mg total) by mouth daily after supper. 03/27/23   Vassie Loll, MD  torsemide (DEMADEX) 20 MG tablet Take 4 tablets (80 mg total) by mouth every morning AND 2 tablets (40 mg total) every evening. 05/06/23   Clegg, Amy D, NP  traZODone (DESYREL) 50 MG tablet Take 1 tablet (50 mg total) by mouth at bedtime. 02/08/23   Shon Hale, MD    Physical Exam: Vitals:   05/28/23 1600 05/28/23 1630 05/28/23 1645 05/28/23 1700  BP: (!) 117/98 115/67 (!) 115/97 (!) 101/52  Pulse: 60 (!) 59    Resp: 16 13 19 18   Temp:      TempSrc:      SpO2: 99% 100%    Weight:      Height:        Constitutional: Awake, mildly lethargic, calm, comfortable Vitals:   05/28/23 1600 05/28/23 1630 05/28/23 1645 05/28/23 1700  BP: (!) 117/98 115/67 (!) 115/97 (!) 101/52  Pulse: 60 (!) 59    Resp: 16 13 19 18   Temp:      TempSrc:      SpO2: 99% 100%    Weight:      Height:       Eyes: PERRL, lids and conjunctivae normal ENMT: Mucous  membranes are moist.   Neck: normal, supple, no masses, no thyromegaly Respiratory: clear to auscultation bilaterally, no wheezing, no crackles. Normal respiratory effort. No accessory muscle use.  Cardiovascular: Regular rate and rhythm, no murmurs / rubs / gallops.  Trace bilateral lower extremity edema.  Extremeties warm. Abdomen: Obese abdomen, no tenderness, soft, no masses palpated. No hepatosplenomegaly. Bowel sounds positive.  Musculoskeletal: no clubbing / cyanosis. No joint deformity upper and lower extremities. Good ROM, no contractures. Normal muscle tone.  Skin: no rashes, lesions, ulcers. No induration Neurologic: No facial asymmetry, moving extremity spontaneously, speech fluent Psychiatric: Awake, mildly lethargic, able to answer questions,  Normal mood.   Labs on Admission: I have personally reviewed following labs and imaging studies  CBC: Recent Labs  Lab 05/28/23 1353  WBC 3.4*  NEUTROABS 1.9  HGB 8.9*  HCT 28.2*  MCV 88.4  PLT 70*   Basic Metabolic Panel: Recent Labs  Lab 05/27/23 1444 05/28/23 1353  NA 139 134*  K 4.2 3.9  CL 98 94*  CO2 29 30  GLUCOSE 118* 92  BUN 26* 29*  CREATININE 2.52* 2.23*  CALCIUM 9.3 9.2   GFR: Estimated Creatinine Clearance: 51.8 mL/min (A) (by C-G formula based on SCr of 2.23 mg/dL (H)). Liver Function Tests: Recent Labs  Lab 05/27/23 1444 05/28/23 1353  AST 43* 43*  ALT 19 19  ALKPHOS 96 96  BILITOT 4.1* 4.2*  PROT 6.3* 6.3*  ALBUMIN 2.7* 2.7*   Recent Labs  Lab 05/28/23 1353  LIPASE 41   Recent Labs  Lab 05/27/23 1444 05/28/23 1353  AMMONIA 77* 64*   Coagulation Profile: Recent Labs  Lab 05/28/23 1353  INR 1.5*   Thyroid Function Tests: Recent Labs    05/27/23 1444  TSH 6.781*   Urine analysis:    Component Value Date/Time   COLORURINE YELLOW 05/28/2023 1453   APPEARANCEUR CLEAR 05/28/2023 1453   LABSPEC 1.008 05/28/2023 1453   PHURINE 6.0 05/28/2023 1453   GLUCOSEU 50 (A) 05/28/2023  1453   HGBUR SMALL (A) 05/28/2023 1453   BILIRUBINUR NEGATIVE 05/28/2023 1453  KETONESUR NEGATIVE 05/28/2023 1453   PROTEINUR NEGATIVE 05/28/2023 1453   NITRITE NEGATIVE 05/28/2023 1453   LEUKOCYTESUR NEGATIVE 05/28/2023 1453    Radiological Exams on Admission: No results found.  EKG: Independently reviewed.  Sinus rhythm, rate 59, QTc 481.  No significant change from prior.  Assessment/Plan Principal Problem:   Hepatic encephalopathy (HCC) Active Problems:   Chronic diastolic heart failure (HCC)   CKD (chronic kidney disease) stage 3, GFR 30-59 ml/min (HCC)   Essential hypertension   Hepatic cirrhosis (HCC)   COPD (chronic obstructive pulmonary disease) (HCC)   DM (diabetes mellitus) (HCC)    Assessment and Plan: * Hepatic encephalopathy (HCC) Presenting with altered mental status-confusion, weakness.  Ammonia level 77 yesterday, 64 today.  Head CT negative for acute abnormality.  UA not suggestive of infection.  Afebrile without leukocytosis.  Reports compliance with lactulose 30 mg twice daily, rifaximin.  Reports 2 bowel movements daily. -Obtain chest x-ray. -Lactulose 30 g given in ED, increase home lactulose 30 mg to 3 times daily dosing.  Would probably benefit from at least 3 bowel movements daily. -Resume home rifaximin -Lactic acid 2.4 likely from liver disease, trend for now  Chronic diastolic heart failure (HCC) Trace bilateral lower extremity swelling, appears mostly chronic.  Last echo 12/2022 EF of 55 to 60%.  Reports compliance with torsemide. -Resume home torsemide 40/80 mg twice daily  CKD (chronic kidney disease) stage 3, GFR 30-59 ml/min (HCC) Stable.  Creatinine at baseline 2.23.  Essential hypertension Stable. -Resume torsemide  Hepatic cirrhosis (HCC) Complicated by encephalopathy and thrombocytopenia.  Platelets 70-baseline.  INR 1.5.  Hemoglobin stable.   COPD (chronic obstructive pulmonary disease) (HCC) Stable.  Resume home regimen  DM  (diabetes mellitus) (HCC) Controlled.  A1c 5.6. ?  Tight control. -Hold Farxiga - SSI- S   DVT prophylaxis: SCDS Code Status: DNR-ACP documents reviewed.  I confirmed DNR status with patient. Family Communication: None At bedside. Disposition Plan: ~ 2 days Consults called: None Admission status:  Obs tele     Author: Onnie Boer, MD 05/28/2023 6:52 PM  For on call review [email or web address].

## 2023-05-28 NOTE — ED Notes (Signed)
 Pt assisted to stand on side of bed to use urinal.

## 2023-05-29 DIAGNOSIS — R278 Other lack of coordination: Secondary | ICD-10-CM | POA: Diagnosis not present

## 2023-05-29 DIAGNOSIS — K5900 Constipation, unspecified: Secondary | ICD-10-CM

## 2023-05-29 DIAGNOSIS — K7460 Unspecified cirrhosis of liver: Secondary | ICD-10-CM | POA: Diagnosis not present

## 2023-05-29 DIAGNOSIS — K7682 Hepatic encephalopathy: Secondary | ICD-10-CM | POA: Diagnosis not present

## 2023-05-29 LAB — BRAIN NATRIURETIC PEPTIDE: B Natriuretic Peptide: 31 pg/mL (ref 0.0–100.0)

## 2023-05-29 LAB — GLUCOSE, CAPILLARY
Glucose-Capillary: 103 mg/dL — ABNORMAL HIGH (ref 70–99)
Glucose-Capillary: 124 mg/dL — ABNORMAL HIGH (ref 70–99)

## 2023-05-29 LAB — HIV ANTIBODY (ROUTINE TESTING W REFLEX): HIV Screen 4th Generation wRfx: NONREACTIVE

## 2023-05-29 LAB — HEMOGLOBIN A1C
Hgb A1c MFr Bld: 5.2 % (ref 4.8–5.6)
Mean Plasma Glucose: 102.54 mg/dL

## 2023-05-29 MED ORDER — LACTULOSE 10 GM/15ML PO SOLN
30.0000 g | Freq: Three times a day (TID) | ORAL | 0 refills | Status: DC
Start: 2023-05-29 — End: 2023-06-28

## 2023-05-29 NOTE — Progress Notes (Signed)
 Received critical value from lab of  lactic acid 2.2. Previous lactic acid was 2.4.  notified MD of lab value.  No new orders received.

## 2023-05-29 NOTE — Plan of Care (Signed)
  Problem: Education: Goal: Knowledge of General Education information will improve Description: Including pain rating scale, medication(s)/side effects and non-pharmacologic comfort measures Outcome: Progressing   Problem: Health Behavior/Discharge Planning: Goal: Ability to manage health-related needs will improve Outcome: Progressing   Problem: Clinical Measurements: Goal: Ability to maintain clinical measurements within normal limits will improve Outcome: Progressing Goal: Will remain free from infection Outcome: Progressing Goal: Diagnostic test results will improve Outcome: Progressing   Problem: Activity: Goal: Risk for activity intolerance will decrease Outcome: Progressing   Problem: Coping: Goal: Level of anxiety will decrease Outcome: Progressing   Problem: Elimination: Goal: Will not experience complications related to bowel motility Outcome: Progressing   Problem: Pain Managment: Goal: General experience of comfort will improve and/or be controlled Outcome: Progressing   Problem: Skin Integrity: Goal: Risk for impaired skin integrity will decrease Outcome: Progressing

## 2023-05-29 NOTE — Plan of Care (Signed)
   Problem: Education: Goal: Knowledge of General Education information will improve Description Including pain rating scale, medication(s)/side effects and non-pharmacologic comfort measures Outcome: Progressing   Problem: Health Behavior/Discharge Planning: Goal: Ability to manage health-related needs will improve Outcome: Progressing

## 2023-05-29 NOTE — Hospital Course (Signed)
 Daniel Crawford is a 68 y.o. male with medical history significant for NASH liver cirrhosis, diastolic CHF, COPD, hypertension, diabetes mellitus, depression and anxiety. Patient presented to the ED with complaints of generalized weakness, confusion.  Patient reports he fell last night and was unable to get up he was able to call for help.  He believes he hit his head on the nightstand.  He does not remember why he fell.  But he reports dizziness today, generalized weakness while working with his physical therapist, with some confusion. He also reports some mild swelling to his bilateral lower extremity over the past 2 to 3 days.  Reports good oral intake, no vomiting, reports some generalized mild abdominal pain, no difficulty breathing no chest pain no cough..  Reports compliance with his torsemide, rifaximin, and lactulose with 2 bowel movements daily.   Patient had ammonia levels checked yesterday as outpatient, it was elevated at 77, with confusion today he was told to come to the ED.   ED Course: Temperature 98.  Heart rate mostly 60s.  Respiratory rate 12-19.  Blood pressure systolic 91-119.  O2 sats greater than 95% on room air. Ammonia level 64.  Platelets low 70.  Lactic acid 2.4.  COVID influenza RSV negative.  UA not suggestive of UTI.   Head CT negative for acute abnormality. Lactulose given.  Hospitalist to admit for hepatic encephalopathy.     ssessment and Plan:  * Hepatic encephalopathy (HCC)  Presenting with altered mental status-confusion, weakness.  Ammonia level 77 yesterday, 64 today.  Head CT negative for acute abnormality.  UA not suggestive of infection.  Afebrile without leukocytosis.  Reports compliance with lactulose 30 mg twice daily, rifaximin.  Reports 2 bowel movements daily. -Obtain chest x-ray. -Lactulose 30 g given in ED, increase home lactulose 30 mg to 3 times daily dosing.  Would probably benefit from at least 3 bowel movements daily. -Resume home rifaximin -Lactic  acid 2.4 likely from liver disease, trend for now   Chronic diastolic heart failure (HCC) -  Trace bilateral lower extremity swelling, appears mostly chronic.  Last echo 12/2022 EF of 55 to 60%.  Reports compliance with torsemide. -Resume home torsemide 40/80 mg twice daily   CKD (chronic kidney disease) stage 3, GFR 30-59 ml/min (HCC) Stable.  Creatinine at baseline 2.23. Lab Results  Component Value Date   CREATININE 2.23 (H) 05/28/2023   CREATININE 2.52 (H) 05/27/2023   CREATININE 2.58 (H) 05/13/2023     Essential hypertension Stable. -Resume torsemide   Hepatic cirrhosis (HCC) Complicated by encephalopathy and thrombocytopenia.  Platelets 70-baseline.  INR 1.5.  Hemoglobin stable.    COPD (chronic obstructive pulmonary disease) (HCC) Stable.  Resume home regimen   DM (diabetes mellitus) (HCC) Controlled.  A1c 5.6. ?  Tight control. -Hold Farxiga - SSI- S

## 2023-05-29 NOTE — Consult Note (Signed)
 Gastroenterology Consult   Referring Provider: No ref. provider found Primary Care Physician:  [facility] Primary Gastroenterologist:  [email or web address]  Patient ID: Regis Hinton [id number]; Dec 01, 1955   Admit date: 05/28/2023  LOS: 0 days   Date of Consultation: 05/29/2023  Reason for Consultation:  cirrhosis, hepatic encephalopathy   History of Present Illness   Wasil Schwarting is a 68 y.o. year old male  with history of MASH cirrhosis complicated by anasarca and HE, asthma, anxiety, CHF, CKD, depression, diabetes, hypertension, hypothyroidism, not a liver transplant candidate who presented to the ED with complaints of generalized weakness.  Patient reported recent L3 compression fractures discharged to skilled nursing facility where he was there for approximately 1 week and returned home.  He was seen by the heart failure clinic Monday was noted to have asterixis at that time.  Patient Dors loose [place].  Reports that he receives on lactulose but did not take it yesterday.  Ammonia level done on Monday was 77.  GI consulted for further evaluation.  ED Course: AST 43, T. bili 4.2, albumin 2.7 BUN 29, creatinine 2.23 Hemoglobin 8.9 Platelet count 70K INR 1.5 Ammonia 64 Respiratory panel negative Lactic acid 2.4 and 2.2 respectively  Consult: Patient well-known to our GI service.  He was last seen in outpatient setting by our team in January, taking lactulose twice daily Xifaxan twice daily having 3-4 bowel moods per day and no mental status changes or confusion.  At that time maintained on spironolactone 100 mg torsemide 60 in the a.m. 40 in the evening.  He notes overall improvement in lower extremity edema and anasarca since last seen.  Change her to 2 g sodium diet mandibular on hospice care.  Patient previously was not a candidate for cecal polyp removal found on colonoscopy and not a candidate for EGD for variceal screening due to moderate to high risk for anesthesia  here.  Patient states that he saw HF clinic Monday and was called yesterday and told ammonia levels were high. He states his PT was at his home and they recommended patient go to the ER as she felt patient was "shaky" and lethargic. He states he had been feeling a little off a few days prior to this, noting some confusion and feeling "uneasy." He report that he I compliant with his lactulose and xifaxan though he sometime delays his dose if he is going to be out at a doctors appt. He did not take these meds prior to his HF clinic appt. He is taking lactulose 30g 2-3/day and xifaxan BID. On average, having 4-5 BMs per day that are softer to looser usually, though reports only one BM yesterday and tool have been harder for the past day or two. He has taken a few oxycodone recently for a fracture in his back which seem to have caused him some constipation.   Denies rectal bleeding, melena. Some swelling to his legs again recently though notes they had previously gone down to almost completely normal. Denies overt ascites.   Patient is a/ox4 during our encounter.    Colonoscopy 2023 with cecal polyp unable to be removed and due for EGD for variceal screening but is not an anesthesia candidate. Conservative measures recommended.    Last EGD: 11/06/2019 - Normal esophagus. Dilated. - Portal hypertensive gastropathy. - Normal examined duodenum. - No specimens collected.    Past Medical History:  Diagnosis Date   Acute pancreatitis 10/02/2022   Anasarca 01/23/2022   Anemia  Anxiety    Asthma    CHF (congestive heart failure) (HCC)    CKD (chronic kidney disease)    D-dimer, elevated 01/22/2022   Decompensation of cirrhosis of liver (HCC) 05/21/2022   Depression    Diabetes mellitus without complication (HCC)    Dyspnea    Hepatic encephalopathy (HCC) 07/10/2022   Hypertension    Hypothyroidism    Liver cirrhosis secondary to NASH (HCC)    NASH (nonalcoholic steatohepatitis)    Other  ascites 06/19/2019   Pancytopenia (HCC) 05/21/2022   Pre-diabetes    Spleen enlarged    Thrombocytopenia (HCC)     Past Surgical History:  Procedure Laterality Date   Bilateral hernia surgery     2006, 2007   CHOLECYSTECTOMY     2016    COLONOSCOPY WITH PROPOFOL N/A 06/28/2021   Procedure: COLONOSCOPY WITH PROPOFOL;  Surgeon: Malissa Hippo, MD;  [facility];  Service: Endoscopy;  Laterality: N/A;  1020   ESOPHAGEAL DILATION N/A 11/06/2019   Procedure: ESOPHAGEAL DILATION;  Surgeon: Dolores Frame, MD;  Location: [facility];  Service: Gastroenterology;  Laterality: N/A;   ESOPHAGOGASTRODUODENOSCOPY (EGD) WITH PROPOFOL N/A 11/06/2019   Procedure: ESOPHAGOGASTRODUODENOSCOPY (EGD) WITH PROPOFOL;  Surgeon: Dolores Frame, MD;  [facility]: [facility];  Service: Gastroenterology;  Laterality: N/A;  1045   IR RADIOLOGIST EVAL & MGMT  02/19/2022   LAPAROSCOPIC ASSISTED VENTRAL HERNIA REPAIR     Polyp removed     in January of 2018.    POLYPECTOMY  06/28/2021   Procedure: POLYPECTOMY INTESTINAL;  Surgeon: Malissa Hippo, MD;  [facility]: [facility];  Service: Endoscopy;;   RIGHT HEART CATH N/A 01/08/2022   Procedure: RIGHT HEART CATH;  Surgeon: Corky Crafts, MD;  Location: [facility] INVASIVE CV LAB;  Service: Cardiovascular;  Laterality: N/A;   RIGHT HEART CATH N/A 01/29/2023   Procedure: RIGHT HEART CATH;  Surgeon: Laurey Morale, MD;  Location: [facility] INVASIVE CV LAB;  Service: Cardiovascular;  Laterality: N/A;    Prior to Admission medications   Medication Sig Start Date End Date Taking? Authorizing Provider  acetaminophen (TYLENOL) 325 MG tablet Take 2 tablets (650 mg total) by mouth every 6 (six) hours as needed for mild pain (pain score 1-3), fever or headache (or Fever >/= 101). 02/08/23  Yes Emokpae, Courage, MD  albuterol (VENTOLIN HFA) 108 (90 Base) MCG/ACT inhaler Inhale 2 puffs into the lungs every 6 (six) hours as needed for wheezing or  shortness of breath. 02/08/23  Yes Emokpae, Courage, MD  Cholecalciferol (VITAMIN D3) 1.25 MG (50000 UT) CAPS Take 1 capsule by mouth once a week. Wednesdays 03/06/23  Yes [provider]  dapagliflozin propanediol (FARXIGA) 10 MG TABS tablet Take 1 tablet (10 mg total) by mouth daily before breakfast. 02/08/23  Yes Emokpae, Courage, MD  diclofenac Sodium (VOLTAREN) 1 % GEL Apply 2 g topically in the morning and at bedtime. 1-2 inches topically twice daily 03/06/23  Yes [provider]  hydrOXYzine (VISTARIL) 50 MG capsule Take 1 capsule (50 mg total) by mouth 3 (three) times daily as needed for anxiety or nausea. Patient taking differently: Take 50 mg by mouth in the morning and at bedtime. 02/08/23  Yes Emokpae, Courage, MD  lactose free nutrition (BOOST) LIQD Take 237 mLs by mouth every evening.   Yes [provider]  lactulose (CHRONULAC) 10 GM/15ML solution Take 45 mLs (30 g total) by mouth 2 (two) times daily. 05/14/23  Yes Johnson, Clanford  L, MD  levothyroxine (SYNTHROID) 175 MCG tablet Take 175 mcg by mouth daily. 05/24/23  Yes [provider]  ondansetron (ZOFRAN) 4 MG tablet Take 4 mg by mouth 2 (two) times daily as needed for nausea or vomiting. 10/28/22  Yes [provider]  pantoprazole (PROTONIX) 40 MG tablet Take 1 tablet (40 mg total) by mouth daily. 01/25/22  Yes Emokpae, Courage, MD  potassium chloride SA (KLOR-CON M) 20 MEQ tablet Take 2 tablets (40 mEq total) by mouth daily. 05/14/23  Yes Johnson, Clanford L, MD  rifaximin (XIFAXAN) 550 MG TABS tablet Take 1 tablet (550 mg total) by mouth 2 (two) times daily. 02/08/23  Yes Emokpae, Courage, MD  sertraline (ZOLOFT) 100 MG tablet Take 100 mg by mouth daily. 09/24/22  Yes [provider]  spironolactone (ALDACTONE) 100 MG tablet Take 1 tablet (100 mg total) by mouth daily. 02/08/23  Yes Emokpae, Courage, MD  tamsulosin (FLOMAX) 0.4 MG CAPS capsule Take 1 capsule (0.4 mg total) by mouth  daily after supper. 03/27/23  Yes Vassie Loll, MD  torsemide (DEMADEX) 20 MG tablet Take 4 tablets (80 mg total) by mouth every morning AND 2 tablets (40 mg total) every evening. 05/06/23  Yes Clegg, Amy D, NP  traZODone (DESYREL) 50 MG tablet Take 1 tablet (50 mg total) by mouth at bedtime. 02/08/23  Yes Shon Hale, MD    Current Facility-Administered Medications  Medication Dose Route Frequency Provider Last Rate Last Admin   albuterol (PROVENTIL) (2.5 MG/3ML) 0.083% nebulizer solution 2.5 mg  2.5 mg Inhalation Q6H PRN Emokpae, Ejiroghene E, MD       hydrOXYzine (VISTARIL) capsule 50 mg  50 mg Oral TID PRN Emokpae, Ejiroghene E, MD       insulin aspart (novoLOG) injection 0-5 Units  0-5 Units Subcutaneous QHS Emokpae, Ejiroghene E, MD       insulin aspart (novoLOG) injection 0-9 Units  0-9 Units Subcutaneous TID WC Emokpae, Ejiroghene E, MD       lactulose (CHRONULAC) 10 GM/15ML solution 30 g  30 g Oral TID Emokpae, Ejiroghene E, MD   30 g at 05/29/23 0900   lactulose (CHRONULAC) 10 GM/15ML solution 30 g  30 g Oral TID Emokpae, Ejiroghene E, MD   30 g at 05/28/23 2233   levothyroxine (SYNTHROID) tablet 175 mcg  175 mcg Oral Daily Emokpae, Ejiroghene E, MD   175 mcg at 05/29/23 0535   ondansetron (ZOFRAN) tablet 4 mg  4 mg Oral Q6H PRN Emokpae, Ejiroghene E, MD       Or   ondansetron (ZOFRAN) injection 4 mg  4 mg Intravenous Q6H PRN Emokpae, Ejiroghene E, MD       pantoprazole (PROTONIX) EC tablet 40 mg  40 mg Oral Daily Emokpae, Ejiroghene E, MD   40 mg at 05/29/23 0900   rifaximin (XIFAXAN) tablet 550 mg  550 mg Oral BID Emokpae, Ejiroghene E, MD   550 mg at 05/29/23 0859   sertraline (ZOLOFT) tablet 100 mg  100 mg Oral Daily Emokpae, Ejiroghene E, MD   100 mg at 05/29/23 0454   spironolactone (ALDACTONE) tablet 100 mg  100 mg Oral Daily Emokpae, Ejiroghene E, MD   100 mg at 05/29/23 0900   tamsulosin (FLOMAX) capsule 0.4 mg  0.4 mg Oral QPC supper Emokpae, Ejiroghene E, MD        torsemide (DEMADEX) tablet 80 mg  80 mg Oral q morning Emokpae, Ejiroghene E, MD   80 mg at 05/29/23 0859   And  torsemide (DEMADEX) tablet 40 mg  40 mg Oral QPM Emokpae, Ejiroghene E, MD   40 mg at 05/28/23 2005   traZODone (DESYREL) tablet 50 mg  50 mg Oral QHS Emokpae, Ejiroghene E, MD   50 mg at 05/28/23 2233    Allergies as of 05/28/2023   (No Known Allergies)    Family History  Problem Relation Age of Onset   Liver cancer Mother    Heart disease Mother    Aneurysm Sister     Social History   Socioeconomic History   Marital status: Single    Spouse name: Not on file   Number of children: Not on file   Years of education: Not on file   Highest education level: Not on file  Occupational History   Not on file  Tobacco Use   Smoking status: Former    Current packs/day: 0.00    Average packs/day: 1 pack/day for 20.0 years (20.0 ttl pk-yrs)    Types: Cigarettes    Start date: 57    Quit date: 2017    Years since quitting: 8.1    Passive exposure: Current   Smokeless tobacco: Never  Vaping Use   Vaping status: Never Used  Substance and Sexual Activity   Alcohol use: No   Drug use: No   Sexual activity: Not Currently  Other Topics Concern   Not on file  Social History Narrative   Not on file   Social Drivers of Health   Financial Resource Strain: Low Risk  (03/28/2021)   Received from [facility], [facility]   Overall Financial Resource Strain (CARDIA)    Difficulty of Paying Living Expenses: Not hard at all  Food Insecurity: No Food Insecurity (05/28/2023)   Hunger Vital Sign    Worried About Running Out of Food in the Last Year: Never true    Ran Out of Food in the Last Year: Never true  Transportation Needs: No Transportation Needs (05/28/2023)   PRAPARE - Administrator, Civil Service (Medical): No    Lack of Transportation (Non-Medical): No  Physical Activity: Not on file  Stress: Not on file  Social Connections: Moderately Integrated  (05/28/2023)   Social Connection and Isolation Panel [NHANES]    Frequency of Communication with Friends and Family: Three times a week    Frequency of Social Gatherings with Friends and Family: Three times a week    Attends Religious Services: 1 to 4 times per year    Active Member of Clubs or Organizations: No    Attends Banker Meetings: 1 to 4 times per year    Marital Status: Divorced  Catering manager Violence: Not At Risk (05/28/2023)   Humiliation, Afraid, Rape, and Kick questionnaire    Fear of Current or Ex-Partner: No    Emotionally Abused: No    Physically Abused: No    Sexually Abused: No     Review of Systems   Gen: Denies any fever, chills, loss of appetite, change in weight or weight loss CV: Denies chest pain, heart palpitations, syncope, edema  Resp: Denies shortness of breath with rest, cough, wheezing, coughing up blood, and pleurisy. GI: denies melena, hematochezia, nausea, vomiting, diarrhea, constipation, dysphagia, odyonophagia, early satiety or weight loss. +asterixis  GU : Denies urinary burning, blood in urine, urinary frequency, and urinary incontinence. MS: Denies joint pain, limitation of movement,  cramps, and atrophy. +swelling to LEs Derm: Denies rash, itching, dry skin, hives. Psych: Denies depression,  anxiety, memory loss, hallucinations, and confusion. Heme: Denies bruising or bleeding Neuro:  Denies any headaches, dizziness, paresthesias, shaking  Physical Exam   Vital Signs in last 24 hours: Temp:  [97.8 F (36.6 C)-98.3 F (36.8 C)] 98.1 F (36.7 C) (03/05 0727) Pulse Rate:  [59-98] 68 (03/05 0727) Resp:  [12-19] 18 (03/05 0727) BP: (91-119)/(50-98) 117/57 (03/05 0727) SpO2:  [95 %-100 %] 95 % (03/05 0727) Weight:  [846 kg] 154 kg (03/04 1335) Last BM Date : 05/28/23  General:   Alert,  Well-developed, well-nourished, pleasant and cooperative in NAD Head:  Normocephalic and atraumatic. Eyes:  Sclera clear, no icterus.   Conjunctiva pink. Mouth:  No deformity or lesions, dentition normal. Neck:  Supple; no masses Lungs:  Clear throughout to auscultation.   No wheezes, crackles, or rhonchi. No acute distress. Heart:  Regular rate and rhythm; no murmurs, clicks, rubs,  or gallops. Abdomen:  Soft, nontender and nondistended. No masses, hepatosplenomegaly or hernias noted. Normal bowel sounds, without guarding, and without rebound. Umbilical hernia noted.   Msk:  Symmetrical without gross deformities. Normal posture. Extremities:  Without clubbing, 2+ pitting edema to LEs  Neurologic:  Alert and  oriented x4. Mild asterixis on exam  Skin:  Intact without significant lesions or rashes. Psych:  Alert and cooperative. Normal mood and affect.   Labs/Studies   Recent Labs Recent Labs    05/28/23 1353  WBC 3.4*  HGB 8.9*  HCT 28.2*  PLT 70*   BMET Recent Labs    05/27/23 1444 05/28/23 1353  NA 139 134*  K 4.2 3.9  CL 98 94*  CO2 29 30  GLUCOSE 118* 92  BUN 26* 29*  CREATININE 2.52* 2.23*  CALCIUM 9.3 9.2   LFT Recent Labs    05/27/23 1444 05/28/23 1353  PROT 6.3* 6.3*  ALBUMIN 2.7* 2.7*  AST 43* 43*  ALT 19 19  ALKPHOS 96 96  BILITOT 4.1* 4.2*   PT/INR Recent Labs    05/28/23 1353  LABPROT 18.5*  INR 1.5*    Radiology/Studies DG CHEST PORT 1 VIEW Result Date: 05/28/2023 CLINICAL DATA:  Generalized weakness EXAM: PORTABLE CHEST 1 VIEW COMPARISON:  05/13/2023 FINDINGS: Cardiac shadow is mildly prominent. No focal infiltrate or effusion is seen. No bony abnormality is noted. IMPRESSION: No acute abnormality noted. Electronically Signed   By: Alcide Clever M.D.   On: 05/28/2023 21:18   CT Head Wo Contrast Result Date: 05/28/2023 CLINICAL DATA:  Altered mental status EXAM: CT HEAD WITHOUT CONTRAST TECHNIQUE: Contiguous axial images were obtained from the base of the skull through the vertex without intravenous contrast. RADIATION DOSE REDUCTION: This exam was performed according to the  departmental dose-optimization program which includes automated exposure control, adjustment of the mA and/or kV according to patient size and/or use of iterative reconstruction technique. COMPARISON:  05/13/2023 FINDINGS: Brain: No mass,hemorrhage or extra-axial collection. Normal appearance of the parenchyma and CSF spaces. Vascular: No hyperdense vessel or unexpected vascular calcification. Skull: The visualized skull base, calvarium and extracranial soft tissues are normal. Sinuses/Orbits: No fluid levels or advanced mucosal thickening of the visualized paranasal sinuses. No mastoid or middle ear effusion. Normal orbits. Other: None. IMPRESSION: Normal head CT. Electronically Signed   By: Deatra Robinson M.D.   On: 05/28/2023 17:58     Assessment   Marlos Schulte is a 68 y.o. year old male MASH cirrhosis complicated by anasarca and HE, asthma, anxiety, CHF, CKD, depression, diabetes, hypertension, hypothyroidism, not a liver transplant candidate who  presented to the ED with complaints of generalized weakness, elevated ammonia in outpatient setting. GI consulted for further evaluation.  Decompensated Cirrhosis with hepatic encephalopathy: Patient with history of decompensated cirrhosis though is not a liver transplant candidate due to other comorbidities.  Patient was previously on hospice care but then did desire to return to palliative pathway.  Has had intermittent episodes of encephalopathy but appeared to be doing well in regards to this in the outpatient setting in January maintained on lactulose and Xifaxan twice daily.  He presented this admission with elevated ammonia in the outpatient setting, noted to have asterixis on exam and congestive heart failure clinic earlier this week.  Patient reports compliance with lactulose and xifaxan though did delay his doses of these Monday prior to going to the HF clinic. He had been feeling a bit off and confused a few days earlier this week. Has notably been on  some opiate pain medication for a fracture in his back and notes more constipation recently which I suspect Is likely the culprit for his elevated ammonia levels .had a large BM yesterday, fortunately, Ammonia is trending down today and he Is a/ox4 on exam today, though with some ongoing mild asterixis.   At this time recommend optimizing lactulose dose, titrating for 2-3 soft stools per day and continue Xifaxan 550 twice daily. Need to limit opiate pain meds as much as possible.   He is up-to-date on [facility] screening with last imaging via ultrasound in December with no focal lesions. AFP in July was 7. Can update AFP in outpatient setting. Patient has been deemed a poor candidate for EV screening via EGD/cecal polyp removal via colonoscopy due to his comorbidities. Will need ongoing cirrhosis management as outpatient.   MELD 3.0 with labs yesterday was 28. Patient has been deemed not a liver transplant candidate in the past given multimorbidities.   Plan / Recommendations    Lactulose 30g TID, titrate for 2-3 soft BMs per day Continue Xifaxan 550mg  BID MELD labs daily Spironolactone 100mg  daily  PPI daily  6. Monitor mental status/asterixis 7. Limit opiate use 8. Continue torsemide 80mg /40mg  am/pm 9. Continue ongoing outpatient cirrhosis care    05/29/2023, 9:12 AM  Kambry Takacs L. Jeanmarie Hubert, MSN, APRN, AGNP-C Adult-Gerontology Nurse Practitioner [facility] at [facility]

## 2023-05-29 NOTE — Discharge Summary (Signed)
 Physician Discharge Summary   Patient: Daniel Crawford MRN: [id number] DOB: [birth year]  Admit date:     05/28/2023  Discharge date: 05/29/23  Discharge Physician: Kendell Bane   PCP: [facility]   Recommendations at discharge:   Follow-up with PCP and gastroenterologist in 1-2 weeks  Discharge Diagnoses: Principal Problem:   Hepatic encephalopathy (HCC) Active Problems:   Chronic diastolic heart failure (HCC)   CKD (chronic kidney disease) stage 3, GFR 30-59 ml/min (HCC)   Essential hypertension   Hepatic cirrhosis (HCC)   COPD (chronic obstructive pulmonary disease) (HCC)   DM (diabetes mellitus) (HCC)  Resolved Problems:   * No resolved hospital problems. *  Hospital Course: * Hepatic encephalopathy (HCC) Presenting with altered mental status-confusion, weakness.  Ammonia level 77 yesterday, 64 today.  Head CT negative for acute abnormality.  UA not suggestive of infection.  Afebrile without leukocytosis.  Reports compliance with lactulose 30 mg twice daily, rifaximin.  Reports 2 bowel movements daily. -Obtain chest x-ray. -Lactulose 30 g given in ED, increase home lactulose 30 mg to 3 times daily dosing.   (Would probably benefit from at least 3 bowel movements daily) -Resume  Rifaximin -Lactic acid 2.4 likely from liver disease, trended for now  Chronic diastolic heart failure (HCC) Trace bilateral lower extremity swelling, appears mostly chronic.  Last echo 12/2022 EF of 55 to 60%.  Reports compliance with torsemide. -Resume home torsemide 40/80 mg twice daily  CKD (chronic kidney disease) stage 3, GFR 30-59 ml/min (HCC) Stable.  Creatinine at baseline 2.23.  Essential hypertension Stable. -Resume torsemide  Hepatic cirrhosis (HCC) Complicated by encephalopathy and thrombocytopenia.  Platelets 70-baseline.  INR 1.5.  Hemoglobin stable.   COPD (chronic obstructive pulmonary disease) (HCC) Stable.  Resume home regimen  DM (diabetes  mellitus) (HCC) Controlled.  A1c 5.6.  -Resum Farxiga    Disposition: Home health Diet recommendation:  Discharge Diet Orders (From admission, onward)     Start     Ordered   05/29/23 0000  Diet - low sodium heart healthy        05/29/23 1200           Cardiac diet DISCHARGE MEDICATION: Allergies as of 05/29/2023   No Known Allergies      Medication List     TAKE these medications    acetaminophen 325 MG tablet Commonly known as: TYLENOL Take 2 tablets (650 mg total) by mouth every 6 (six) hours as needed for mild pain (pain score 1-3), fever or headache (or Fever >/= 101).   albuterol 108 (90 Base) MCG/ACT inhaler Commonly known as: VENTOLIN HFA Inhale 2 puffs into the lungs every 6 (six) hours as needed for wheezing or shortness of breath.   dapagliflozin propanediol 10 MG Tabs tablet Commonly known as: Farxiga Take 1 tablet (10 mg total) by mouth daily before breakfast.   diclofenac Sodium 1 % Gel Commonly known as: VOLTAREN Apply 2 g topically in the morning and at bedtime. 1-2 inches topically twice daily   hydrOXYzine 50 MG capsule Commonly known as: VISTARIL Take 1 capsule (50 mg total) by mouth 3 (three) times daily as needed for anxiety or nausea. What changed: when to take this   lactose free nutrition Liqd Take 237 mLs by mouth every evening.   lactulose 10 GM/15ML solution Commonly known as: CHRONULAC Take 45 mLs (30 g total) by mouth 3 (three) times daily. What changed: when to take this   levothyroxine 175 MCG tablet Commonly known as:  SYNTHROID Take 175 mcg by mouth daily.   ondansetron 4 MG tablet Commonly known as: ZOFRAN Take 4 mg by mouth 2 (two) times daily as needed for nausea or vomiting.   pantoprazole 40 MG tablet Commonly known as: PROTONIX Take 1 tablet (40 mg total) by mouth daily.   potassium chloride SA 20 MEQ tablet Commonly known as: KLOR-CON M Take 2 tablets (40 mEq total) by mouth daily.   rifaximin 550 MG Tabs  tablet Commonly known as: XIFAXAN Take 1 tablet (550 mg total) by mouth 2 (two) times daily.   sertraline 100 MG tablet Commonly known as: ZOLOFT Take 100 mg by mouth daily.   spironolactone 100 MG tablet Commonly known as: ALDACTONE Take 1 tablet (100 mg total) by mouth daily.   tamsulosin 0.4 MG Caps capsule Commonly known as: FLOMAX Take 1 capsule (0.4 mg total) by mouth daily after supper.   torsemide 20 MG tablet Commonly known as: DEMADEX Take 4 tablets (80 mg total) by mouth every morning AND 2 tablets (40 mg total) every evening.   traZODone 50 MG tablet Commonly known as: DESYREL Take 1 tablet (50 mg total) by mouth at bedtime.   Vitamin D3 1.25 MG (50000 UT) Caps Take 1 capsule by mouth once a week. Wednesdays        Discharge Exam: Ceasar Mons Weights   05/28/23 1335  Weight: (!) 154 kg        General:  AAO x 3,  cooperative, no distress;   HEENT:  Normocephalic, PERRL, otherwise with in Normal limits   Neuro:  CNII-XII intact. , normal motor and sensation, reflexes intact   Lungs:   Clear to auscultation BL, Respirations unlabored,  No wheezes / crackles  Cardio:    S1/S2, RRR, No murmure, No Rubs or Gallops   Abdomen:  Soft, non-tender, bowel sounds active all four quadrants, no guarding or peritoneal signs.  Muscular  skeletal:  Limited exam -global generalized weaknesses - in bed, able to move all 4 extremities,   2+ pulses,  symmetric, No pitting edema  Skin:  Dry, warm to touch, negative for any Rashes,  Wounds: Please see nursing documentation          Condition at discharge: good  The results of significant diagnostics from this hospitalization (including imaging, microbiology, ancillary and laboratory) are listed below for reference.   Imaging Studies: DG CHEST PORT 1 VIEW Result Date: 05/28/2023 CLINICAL DATA:  Generalized weakness EXAM: PORTABLE CHEST 1 VIEW COMPARISON:  05/13/2023 FINDINGS: Cardiac shadow is mildly prominent. No focal  infiltrate or effusion is seen. No bony abnormality is noted. IMPRESSION: No acute abnormality noted. Electronically Signed   By: Alcide Clever M.D.   On: 05/28/2023 21:18   CT Head Wo Contrast Result Date: 05/28/2023 CLINICAL DATA:  Altered mental status EXAM: CT HEAD WITHOUT CONTRAST TECHNIQUE: Contiguous axial images were obtained from the base of the skull through the vertex without intravenous contrast. RADIATION DOSE REDUCTION: This exam was performed according to the departmental dose-optimization program which includes automated exposure control, adjustment of the mA and/or kV according to patient size and/or use of iterative reconstruction technique. COMPARISON:  05/13/2023 FINDINGS: Brain: No mass,hemorrhage or extra-axial collection. Normal appearance of the parenchyma and CSF spaces. Vascular: No hyperdense vessel or unexpected vascular calcification. Skull: The visualized skull base, calvarium and extracranial soft tissues are normal. Sinuses/Orbits: No fluid levels or advanced mucosal thickening of the visualized paranasal sinuses. No mastoid or middle ear effusion. Normal orbits. Other: None.  IMPRESSION: Normal head CT. Electronically Signed   By: Deatra Robinson M.D.   On: 05/28/2023 17:58   CT CHEST ABDOMEN PELVIS WO CONTRAST Result Date: 05/13/2023 CLINICAL DATA:  Unintended weight loss. Generalized abdominal pain, back pain. Weakness. EXAM: CT CHEST, ABDOMEN AND PELVIS WITHOUT CONTRAST TECHNIQUE: Multidetector CT imaging of the chest, abdomen and pelvis was performed following the standard protocol without IV contrast. RADIATION DOSE REDUCTION: This exam was performed according to the departmental dose-optimization program which includes automated exposure control, adjustment of the mA and/or kV according to patient size and/or use of iterative reconstruction technique. COMPARISON:  CT scan renal stone protocol from 04/23/2023 and CT angiography chest from 01/22/2022. FINDINGS: CT CHEST FINDINGS  Cardiovascular: Normal cardiac size. No pericardial effusion. No aortic aneurysm. Mediastinum/Nodes: Visualized thyroid gland appears grossly unremarkable. No solid / cystic mediastinal masses. The esophagus is nondistended precluding optimal assessment. No mediastinal or axillary lymphadenopathy by size criteria. Evaluation of bilateral hila is limited due to lack on intravenous contrast: however, no large hilar lymphadenopathy identified. Lungs/Pleura: The central tracheo-bronchial tree is patent. No mass or consolidation. No pleural effusion or pneumothorax. No suspicious lung nodules. Musculoskeletal: The visualized soft tissues of the chest wall are grossly unremarkable. No suspicious osseous lesions. There are mild multilevel degenerative changes in the visualized spine. CT ABDOMEN PELVIS FINDINGS Hepatobiliary: The liver is normal in size. There is liver surface irregularity/nodularity, compatible with cirrhosis. No discrete focal mass seen on this unenhanced exam. No intrahepatic or extrahepatic bile duct dilation. Gallbladder is surgically absent. Pancreas: Small/atrophic pancreas.  Not well evaluated on this exam. Spleen: Moderate to markedly enlarged and lobulated spleen measuring up to 13.3 x 20.8 cm orthogonally on coronal plane. No focal lesion seen within the limitations of this unenhanced exam. Adrenals/Urinary Tract: Adrenal glands are unremarkable. No suspicious renal mass within the limitations of this unenhanced exam. There is a 4 mm nonobstructing calculus in the right kidney lower pole calyx and a 1-2 mm nonobstructing calculus in the left kidney interpolar region. No ureterolithiasis or obstructive uropathy on either side. Unremarkable urinary bladder. Stomach/Bowel: No disproportionate dilation of the small or large bowel loops. No evidence of abnormal bowel wall thickening or inflammatory changes. The appendix is unremarkable. Vascular/Lymphatic: There is mild ascites mainly in the  perihepatic/perisplenic region. No pneumoperitoneum. There is also fat stranding mainly concentrated around the mesenteric vessels which is also likely secondary to ascites. No abdominal or pelvic lymphadenopathy, by size criteria. No aneurysmal dilation of the major abdominal arteries. Note is made of prominent venous collaterals near the lower thoracic esophagus, GE junction and in the left upper quadrant, not well evaluated on this exam but favored to represent sequela of portal hypertension. Reproductive: Normal size prostate. Symmetric seminal vesicles. Other: Redemonstration of small-to-moderate periumbilical hernia containing fat and trace amount of ascitic fluid. There are metallic anchors in the left inguinal region, from prior hernia repair. However there is still small sized fat containing left inguinal hernia. There is moderate anasarca. Musculoskeletal: No suspicious osseous lesions. There are mild - moderate multilevel degenerative changes in the visualized spine. There is new superior endplate compression deformity of L3 vertebral body, when compared to the prior exam from 04/23/2023. No significant retropulsion or spinal canal compromise. IMPRESSION: 1. No discrete neoplastic process identified within the chest, abdomen or pelvis on this unenhanced exam. 2. There is new superior endplate compression deformity of L3 vertebral body, when compared to the prior exam from 04/23/2023. No significant retropulsion or spinal canal compromise. 3.  No lung mass, consolidation, pleural effusion or pneumothorax. 4. Cirrhotic liver with sequela of portal hypertension characterized by mild ascites, splenomegaly and venous collaterals. 5. Multiple other nonacute observations, as described above. Electronically Signed   By: Jules Schick M.D.   On: 05/13/2023 09:37   CT Head Wo Contrast Result Date: 05/13/2023 CLINICAL DATA:  Mental status change with unknown cause. EXAM: CT HEAD WITHOUT CONTRAST TECHNIQUE:  Contiguous axial images were obtained from the base of the skull through the vertex without intravenous contrast. RADIATION DOSE REDUCTION: This exam was performed according to the departmental dose-optimization program which includes automated exposure control, adjustment of the mA and/or kV according to patient size and/or use of iterative reconstruction technique. COMPARISON:  02/01/2023 FINDINGS: Brain: No evidence of acute infarction, hemorrhage, hydrocephalus, extra-axial collection or mass lesion/mass effect. Vascular: No hyperdense vessel or unexpected calcification. Skull: Normal. Negative for fracture or focal lesion. Sinuses/Orbits: No acute finding. IMPRESSION: Stable, negative head CT. Electronically Signed   By: Tiburcio Pea M.D.   On: 05/13/2023 09:19   DG Chest Portable 1 View Result Date: 05/13/2023 CLINICAL DATA:  Generalized weakness and pain EXAM: PORTABLE CHEST 1 VIEW COMPARISON:  03/25/2023 FINDINGS: Stable generous heart size. Normal aortic and hilar contours. There is no edema, consolidation, effusion, or pneumothorax. IMPRESSION: Stable exam.  No acute finding. Electronically Signed   By: Tiburcio Pea M.D.   On: 05/13/2023 07:03    Microbiology: Results for orders placed or performed during the hospital encounter of 05/28/23  Resp panel by RT-PCR (RSV, Flu A&B, Covid) Anterior Nasal Swab     Status: None   Collection Time: 05/28/23  2:54 PM   Specimen: Anterior Nasal Swab  Result Value Ref Range Status   SARS Coronavirus 2 by RT PCR NEGATIVE NEGATIVE Final    Comment: (NOTE) SARS-CoV-2 target nucleic acids are NOT DETECTED.  The SARS-CoV-2 RNA is generally detectable in upper respiratory specimens during the acute phase of infection. The lowest concentration of SARS-CoV-2 viral copies this assay can detect is 138 copies/mL. A negative result does not preclude SARS-Cov-2 infection and should not be used as the sole basis for treatment or other patient management  decisions. A negative result Kenna occur with  improper specimen collection/handling, submission of specimen other than nasopharyngeal swab, presence of viral mutation(s) within the areas targeted by this assay, and inadequate number of viral copies(<138 copies/mL). A negative result must be combined with clinical observations, patient history, and epidemiological information. The expected result is Negative.  Fact Sheet for Patients:  BloggerCourse.com  Fact Sheet for Healthcare Providers:  SeriousBroker.it  This test is no t yet approved or cleared by the [place] FDA and  has been authorized for detection and/or diagnosis of SARS-CoV-2 by FDA under an Emergency Use Authorization (EUA). This EUA will remain  in effect (meaning this test can be used) for the duration of the COVID-19 declaration under Section 564(b)(1) of the Act, 21 U.S.C.section 360bbb-3(b)(1), unless the authorization is terminated  or revoked sooner.       Influenza A by PCR NEGATIVE NEGATIVE Final   Influenza B by PCR NEGATIVE NEGATIVE Final    Comment: (NOTE) The Xpert Xpress SARS-CoV-2/FLU/RSV plus assay is intended as an aid in the diagnosis of influenza from Nasopharyngeal swab specimens and should not be used as a sole basis for treatment. Nasal washings and aspirates are unacceptable for Xpert Xpress SARS-CoV-2/FLU/RSV testing.  Fact Sheet for Patients: BloggerCourse.com  Fact Sheet for Healthcare Providers: SeriousBroker.it  This  test is not yet approved or cleared by the [place] and has been authorized for detection and/or diagnosis of SARS-CoV-2 by FDA under an Emergency Use Authorization (EUA). This EUA will remain in effect (meaning this test can be used) for the duration of the COVID-19 declaration under Section 564(b)(1) of the Act, 21 U.S.C. section 360bbb-3(b)(1), unless the  authorization is terminated or revoked.     Resp Syncytial Virus by PCR NEGATIVE NEGATIVE Final    Comment: (NOTE) Fact Sheet for Patients: BloggerCourse.com  Fact Sheet for Healthcare Providers: SeriousBroker.it  This test is not yet approved or cleared by the [place] FDA and has been authorized for detection and/or diagnosis of SARS-CoV-2 by FDA under an Emergency Use Authorization (EUA). This EUA will remain in effect (meaning this test can be used) for the duration of the COVID-19 declaration under Section 564(b)(1) of the Act, 21 U.S.C. section 360bbb-3(b)(1), unless the authorization is terminated or revoked.  Performed at [facility], [address] [place], [place] [address]     Labs: CBC: Recent Labs  Lab 05/28/23 1353  WBC 3.4*  NEUTROABS 1.9  HGB 8.9*  HCT 28.2*  MCV 88.4  PLT 70*   Basic Metabolic Panel: Recent Labs  Lab 05/27/23 1444 05/28/23 1353  NA 139 134*  K 4.2 3.9  CL 98 94*  CO2 29 30  GLUCOSE 118* 92  BUN 26* 29*  CREATININE 2.52* 2.23*  CALCIUM 9.3 9.2   Liver Function Tests: Recent Labs  Lab 05/27/23 1444 05/28/23 1353  AST 43* 43*  ALT 19 19  ALKPHOS 96 96  BILITOT 4.1* 4.2*  PROT 6.3* 6.3*  ALBUMIN 2.7* 2.7*   CBG: Recent Labs  Lab 05/28/23 2232 05/29/23 0729 05/29/23 1125  GLUCAP 154* 103* 124*    Discharge time spent: greater than 30 minutes.  Signed: Kendell Bane, MD [facility] 05/29/2023

## 2023-05-29 NOTE — TOC CM/SW Note (Signed)
 Transition of Care [facility]) - Inpatient Brief Assessment   Patient Details  Name: Daniel Crawford MRN: [id number] Date of Birth: 1956/03/22  Transition of Care [facility]) CM/SW Contact:    Isabella Bowens, LCSWA Phone Number: 05/29/2023, 9:23 AM   Clinical Narrative: CSW spoke with pt , pt desires to return back home and do therapy his therapy.   Transition of Care Department [facility]) has reviewed patient and no TOC needs have been identified at this time. We will continue to monitor patient advancement through interdisciplinary progression rounds. If new patient transition needs arise, please place a TOC consult.   Transition of Care Asessment: Insurance and Status: Insurance coverage has been reviewed Patient has primary care physician: Yes Home environment has been reviewed: Single Family Home Prior level of function:: Independent Prior/Current Home Services: No current home services Social Drivers of Health Review: SDOH reviewed no interventions necessary Readmission risk has been reviewed: Yes Transition of care needs: no transition of care needs at this time

## 2023-05-31 ENCOUNTER — Ambulatory Visit: Payer: Medicare HMO | Admitting: Podiatry

## 2023-05-31 DIAGNOSIS — M79675 Pain in left toe(s): Secondary | ICD-10-CM

## 2023-05-31 DIAGNOSIS — B351 Tinea unguium: Secondary | ICD-10-CM | POA: Diagnosis not present

## 2023-05-31 DIAGNOSIS — M79674 Pain in right toe(s): Secondary | ICD-10-CM

## 2023-05-31 NOTE — Progress Notes (Signed)
  Subjective:  Patient ID: Daniel Crawford, male    DOB: Aplin 24, 1957,  MRN: [id number]  Chief Complaint  Patient presents with   Nail Problem    Nail trim    68 y.o. male returns for the above complaint.  Patient presents with thickened and onychodystrophy mycotic toenails x 10 mild pain on palpation worse with ambulation is with pressure patient not regularly self he would like for me to do it denies any other acute complaints.  Objective:  There were no vitals filed for this visit. Podiatric Exam: Vascular: dorsalis pedis and posterior tibial pulses are palpable bilateral. Capillary return is immediate. Temperature gradient is WNL. Skin turgor WNL  Sensorium: Normal Semmes Weinstein monofilament test. Normal tactile sensation bilaterally. Nail Exam: Pt has thick disfigured discolored nails with subungual debris noted bilateral entire nail hallux through fifth toenails.  Pain on palpation to the nails. Ulcer Exam: There is no evidence of ulcer or pre-ulcerative changes or infection. Orthopedic Exam: Muscle tone and strength are WNL. No limitations in general ROM. No crepitus or effusions noted.  Skin: No Porokeratosis. No infection or ulcers    Assessment & Plan:   1. Pain due to onychomycosis of toenails of both feet     Patient was evaluated and treated and all questions answered.  Onychomycosis with pain  -Nails palliatively debrided as below. -Educated on self-care  Procedure: Nail Debridement Rationale: pain  Type of Debridement: manual, sharp debridement. Instrumentation: Nail nipper, rotary burr. Number of Nails: 10  Procedures and Treatment: Consent by patient was obtained for treatment procedures. The patient understood the discussion of treatment and procedures well. All questions were answered thoroughly reviewed. Debridement of mycotic and hypertrophic toenails, 1 through 5 bilateral and clearing of subungual debris. No ulceration, no infection noted.  Return Visit-Office  Procedure: Patient instructed to return to the office for a follow up visit 3 months for continued evaluation and treatment.  Nicholes Rough, DPM    No follow-ups on file.

## 2023-06-06 ENCOUNTER — Telehealth (HOSPITAL_COMMUNITY): Payer: Self-pay | Admitting: Cardiology

## 2023-06-06 NOTE — Telephone Encounter (Signed)
 Per Sharlette Dense MEd Pt needs OTC cold meds cold- congestion/drainage    [phone]  Returned call -LMOM  Returned call to patient, requests that otc options be given directly to [facility]     [facility] Guidelines for over-the-counter treatments for mild illnesses  Medications You can Use  Brand Name Generic Name Uses Comments  Benadryl Diphenhydramine Cold/allergy symptoms Do not use any Bendaryl products that say "congestion"  Loratadine Claritin Cold/allergy symptoms --  Coricidin HBP Varies with formulation Cold/flu  symptoms --  Mussinex Guaifenesin Mucus removal  --  Delsym Dextromethorphan Cough suppression --  Robitussin Dextromethorphan and guaifenesin Cough suppression + mucus removal Do not use robitussin products that include "PE", "PM," or "CF"  Vicks VapoRub Camphor, eucalyptus oil, menthol Cough and congestion  --  Vicks 44 Varies with formulation Cough suppression Do not use Vicks 44 D  Saline Nasal Spray Saline Congestion relief --   Medications to AVOID  Brand Name Generic Name  Alka-Seltzer Calcium carbonate + aspirin  Sudafed Pseudoephedrine  Comtrex Acetaminophen + pseudoephedrine  Theraflu Acetaminophen + phenylephrine  Dimetapp Brompheniramine + dextromethorphan + phenylephrine  Medicated nasal sprays Oxymetazoline, phenylephrine, etc.  Drixoral Dexbrompheniramine + pseudoephedrine  Chlor-Trimeton Chlorpheniramine  *AVOID AS THESE MEDICATIONS CAN CAUSE HIGH BLOOD PRESSURE AND HEART RATE  Common ingredients to avoid: pseudoephedrine, epinephrine, phenylephrine, brompheniramine, chlorpheniramine (ALWAYS READ LABLES CAREFULLY)  THIS IS NOT A COMPREHENSIVE LIST - If unsure about a medicine, PLEASE CALL AND ASK!

## 2023-06-06 NOTE — Telephone Encounter (Signed)
 Elana aware and copy via email

## 2023-06-11 ENCOUNTER — Other Ambulatory Visit: Payer: Self-pay

## 2023-06-11 ENCOUNTER — Encounter (HOSPITAL_COMMUNITY): Payer: Self-pay | Admitting: Emergency Medicine

## 2023-06-11 ENCOUNTER — Emergency Department (HOSPITAL_COMMUNITY)

## 2023-06-11 ENCOUNTER — Emergency Department (HOSPITAL_COMMUNITY)
Admission: EM | Admit: 2023-06-11 | Discharge: 2023-06-11 | Disposition: A | Discharge status: Home / Self Care | Attending: Emergency Medicine | Admitting: Emergency Medicine

## 2023-06-11 DIAGNOSIS — E1122 Type 2 diabetes mellitus with diabetic chronic kidney disease: Secondary | ICD-10-CM | POA: Insufficient documentation

## 2023-06-11 DIAGNOSIS — E860 Dehydration: Secondary | ICD-10-CM | POA: Diagnosis not present

## 2023-06-11 DIAGNOSIS — Z87891 Personal history of nicotine dependence: Secondary | ICD-10-CM | POA: Diagnosis not present

## 2023-06-11 DIAGNOSIS — R410 Disorientation, unspecified: Secondary | ICD-10-CM | POA: Diagnosis not present

## 2023-06-11 DIAGNOSIS — I509 Heart failure, unspecified: Secondary | ICD-10-CM | POA: Diagnosis not present

## 2023-06-11 DIAGNOSIS — Z79899 Other long term (current) drug therapy: Secondary | ICD-10-CM | POA: Insufficient documentation

## 2023-06-11 DIAGNOSIS — D649 Anemia, unspecified: Secondary | ICD-10-CM | POA: Insufficient documentation

## 2023-06-11 DIAGNOSIS — R42 Dizziness and giddiness: Secondary | ICD-10-CM | POA: Diagnosis not present

## 2023-06-11 DIAGNOSIS — R531 Weakness: Secondary | ICD-10-CM | POA: Diagnosis not present

## 2023-06-11 DIAGNOSIS — J45909 Unspecified asthma, uncomplicated: Secondary | ICD-10-CM | POA: Insufficient documentation

## 2023-06-11 DIAGNOSIS — I130 Hypertensive heart and chronic kidney disease with heart failure and stage 1 through stage 4 chronic kidney disease, or unspecified chronic kidney disease: Secondary | ICD-10-CM | POA: Insufficient documentation

## 2023-06-11 DIAGNOSIS — I959 Hypotension, unspecified: Secondary | ICD-10-CM | POA: Diagnosis not present

## 2023-06-11 DIAGNOSIS — N189 Chronic kidney disease, unspecified: Secondary | ICD-10-CM | POA: Insufficient documentation

## 2023-06-11 DIAGNOSIS — Z743 Need for continuous supervision: Secondary | ICD-10-CM | POA: Diagnosis not present

## 2023-06-11 LAB — CBC WITH DIFFERENTIAL/PLATELET
Abs Immature Granulocytes: 0.03 10*3/uL (ref 0.00–0.07)
Basophils Absolute: 0 10*3/uL (ref 0.0–0.1)
Basophils Relative: 1 %
Eosinophils Absolute: 0.3 10*3/uL (ref 0.0–0.5)
Eosinophils Relative: 8 %
HCT: 26.9 % — ABNORMAL LOW (ref 39.0–52.0)
Hemoglobin: 8.4 g/dL — ABNORMAL LOW (ref 13.0–17.0)
Immature Granulocytes: 1 %
Lymphocytes Relative: 17 %
Lymphs Abs: 0.6 10*3/uL — ABNORMAL LOW (ref 0.7–4.0)
MCH: 27.6 pg (ref 26.0–34.0)
MCHC: 31.2 g/dL (ref 30.0–36.0)
MCV: 88.5 fL (ref 80.0–100.0)
Monocytes Absolute: 0.4 10*3/uL (ref 0.1–1.0)
Monocytes Relative: 11 %
Neutro Abs: 2 10*3/uL (ref 1.7–7.7)
Neutrophils Relative %: 62 %
Platelets: 55 10*3/uL — ABNORMAL LOW (ref 150–400)
RBC: 3.04 MIL/uL — ABNORMAL LOW (ref 4.22–5.81)
RDW: 18.5 % — ABNORMAL HIGH (ref 11.5–15.5)
WBC: 3.3 10*3/uL — ABNORMAL LOW (ref 4.0–10.5)
nRBC: 0 % (ref 0.0–0.2)

## 2023-06-11 LAB — COMPREHENSIVE METABOLIC PANEL
ALT: 18 U/L (ref 0–44)
AST: 45 U/L — ABNORMAL HIGH (ref 15–41)
Albumin: 2.7 g/dL — ABNORMAL LOW (ref 3.5–5.0)
Alkaline Phosphatase: 114 U/L (ref 38–126)
Anion gap: 11 (ref 5–15)
BUN: 25 mg/dL — ABNORMAL HIGH (ref 8–23)
CO2: 25 mmol/L (ref 22–32)
Calcium: 8.7 mg/dL — ABNORMAL LOW (ref 8.9–10.3)
Chloride: 96 mmol/L — ABNORMAL LOW (ref 98–111)
Creatinine, Ser: 2.36 mg/dL — ABNORMAL HIGH (ref 0.61–1.24)
GFR, Estimated: 30 mL/min — ABNORMAL LOW (ref >60)
Glucose, Bld: 162 mg/dL — ABNORMAL HIGH (ref 70–99)
Potassium: 3.7 mmol/L (ref 3.5–5.1)
Sodium: 132 mmol/L — ABNORMAL LOW (ref 135–145)
Total Bilirubin: 3.8 mg/dL — ABNORMAL HIGH (ref 0.0–1.2)
Total Protein: 6.4 g/dL — ABNORMAL LOW (ref 6.5–8.1)

## 2023-06-11 LAB — AMMONIA: Ammonia: 53 umol/L — ABNORMAL HIGH (ref 9–35)

## 2023-06-11 MED ORDER — SODIUM CHLORIDE 0.9 % IV BOLUS
500.0000 mL | Freq: Once | INTRAVENOUS | Status: AC
  Administered 2023-06-11: 500 mL via INTRAVENOUS

## 2023-06-11 MED ORDER — LIDOCAINE-EPINEPHRINE (PF) 2 %-1:200000 IJ SOLN
20.0000 mL | Freq: Once | INTRAMUSCULAR | Status: DC
  Filled 2023-06-11: qty 20

## 2023-06-11 NOTE — ED Triage Notes (Signed)
 Pt called ems due to overall weakness, hypotension and dizziness that started with afternoon.

## 2023-06-11 NOTE — ED Provider Notes (Signed)
 [place] EMERGENCY DEPARTMENT AT [facility] Provider Note  CSN: [id number] Arrival date & time: 06/11/23 1551  Chief Complaint(s) Weakness  HPI Daniel Crawford is a 68 y.o. male who is a 68 year old male here today for generalized weakness, fatigue, dizziness.  Patient with a history of liver cirrhosis secondary to [facility], hepatic encephalopathy, CKD.  EMS providing additional history.  Patient reports that beginning this morning, he began to feel weak and tired.  He denies any fever or chills.   Past Medical History Past Medical History:  Diagnosis Date   Acute pancreatitis 10/02/2022   Anasarca 01/23/2022   Anemia    Anxiety    Asthma    CHF (congestive heart failure) (HCC)    CKD (chronic kidney disease)    D-dimer, elevated 01/22/2022   Decompensation of cirrhosis of liver (HCC) 05/21/2022   Depression    Diabetes mellitus without complication (HCC)    Dyspnea    Hepatic encephalopathy (HCC) 07/10/2022   Hypertension    Hypothyroidism    Liver cirrhosis secondary to NASH (HCC)    NASH (nonalcoholic steatohepatitis)    Other ascites 06/19/2019   Pancytopenia (HCC) 05/21/2022   Pre-diabetes    Spleen enlarged    Thrombocytopenia (HCC)    Patient Active Problem List   Diagnosis Date Noted   Elevated TSH 05/13/2023   Lumbar back pain 04/25/2023   Orthostatic hypotension 03/25/2023   Dehydration 03/25/2023   Lactic acidosis 03/25/2023   Hip pain 03/25/2023   Type 2 diabetes mellitus with hyperglycemia (HCC) 03/25/2023   Depression 03/25/2023   (HFpEF) heart failure with preserved ejection fraction (HCC) 03/25/2023   Obesity, Class III, BMI 40-49.9 (morbid obesity) (HCC) 03/11/2023   Hepatic encephalopathy (HCC) 02/01/2023   Acute kidney injury superimposed on stage 3b chronic kidney disease (HCC) 01/27/2023   COPD (chronic obstructive pulmonary disease) (HCC) 01/21/2023   Acute on chronic diastolic (congestive) heart failure (HCC) 01/21/2023   Cellulitis and  abscess of left leg 10/23/2022   Chronic hyponatremia 10/21/2022   CKD (chronic kidney disease) stage 3, GFR 30-59 ml/min (HCC) 07/06/2022   Umbilical hernia 07/05/2022   Obesity, class 3 05/25/2022   Hepatic cirrhosis (HCC) 03/13/2022   Iron deficiency anemia 02/28/2022   GERD (gastroesophageal reflux disease) 02/28/2022   Generalized weakness 01/23/2022   Essential hypertension 01/23/2022   Volume overload 01/23/2022   Protein calorie malnutrition (HCC) 01/22/2022   Chronic diastolic heart failure (HCC)    Hypogonadism, male 12/26/2021   History of colonic polyps 07/27/2021   Auditory hallucinations 07/27/2021   Prolonged QT interval 04/19/2021   Hypokalemia 04/18/2021   Cellulitis of left leg 03/26/2021   OSA (obstructive sleep apnea) 01/20/2020   History of ascites 10/22/2019   Acquired hypothyroidism 10/16/2016   Depression with anxiety 10/16/2016   Thrombocytopenia (HCC) 10/16/2016   Spleen enlarged 10/16/2016   DM (diabetes mellitus) (HCC) 02/28/2016   Class 3 severe obesity in adult [facility]) 02/28/2016   Home Medication(s) Prior to Admission medications   Medication Sig Start Date End Date Taking? Authorizing Provider  acetaminophen (TYLENOL) 325 MG tablet Take 2 tablets (650 mg total) by mouth every 6 (six) hours as needed for mild pain (pain score 1-3), fever or headache (or Fever >/= 101). 02/08/23   Shon Hale, MD  albuterol (VENTOLIN HFA) 108 (90 Base) MCG/ACT inhaler Inhale 2 puffs into the lungs every 6 (six) hours as needed for wheezing or shortness of breath. 02/08/23   Shon Hale, MD  Cholecalciferol (VITAMIN D3) 1.25  MG (50000 UT) CAPS Take 1 capsule by mouth once a week. Wednesdays 03/06/23   [provider]  dapagliflozin propanediol (FARXIGA) 10 MG TABS tablet Take 1 tablet (10 mg total) by mouth daily before breakfast. 02/08/23   Shon Hale, MD  diclofenac Sodium (VOLTAREN) 1 % GEL Apply 2 g topically in the morning and at bedtime. 1-2  inches topically twice daily 03/06/23   [provider]  hydrOXYzine (VISTARIL) 50 MG capsule Take 1 capsule (50 mg total) by mouth 3 (three) times daily as needed for anxiety or nausea. Patient taking differently: Take 50 mg by mouth in the morning and at bedtime. 02/08/23   Shon Hale, MD  lactose free nutrition (BOOST) LIQD Take 237 mLs by mouth every evening.    [provider]  lactulose (CHRONULAC) 10 GM/15ML solution Take 45 mLs (30 g total) by mouth 3 (three) times daily. 05/29/23   ShahmehdiGemma Payor, MD  levothyroxine (SYNTHROID) 175 MCG tablet Take 175 mcg by mouth daily. 05/24/23   [provider]  ondansetron (ZOFRAN) 4 MG tablet Take 4 mg by mouth 2 (two) times daily as needed for nausea or vomiting. 10/28/22   [provider]  pantoprazole (PROTONIX) 40 MG tablet Take 1 tablet (40 mg total) by mouth daily. 01/25/22   Shon Hale, MD  potassium chloride SA (KLOR-CON M) 20 MEQ tablet Take 2 tablets (40 mEq total) by mouth daily. 05/14/23   Johnson, Clanford L, MD  rifaximin (XIFAXAN) 550 MG TABS tablet Take 1 tablet (550 mg total) by mouth 2 (two) times daily. 02/08/23   Shon Hale, MD  sertraline (ZOLOFT) 100 MG tablet Take 100 mg by mouth daily. 09/24/22   [provider]  spironolactone (ALDACTONE) 100 MG tablet Take 1 tablet (100 mg total) by mouth daily. 02/08/23   Shon Hale, MD  tamsulosin (FLOMAX) 0.4 MG CAPS capsule Take 1 capsule (0.4 mg total) by mouth daily after supper. 03/27/23   Vassie Loll, MD  torsemide (DEMADEX) 20 MG tablet Take 4 tablets (80 mg total) by mouth every morning AND 2 tablets (40 mg total) every evening. 05/06/23   Clegg, Amy D, NP  traZODone (DESYREL) 50 MG tablet Take 1 tablet (50 mg total) by mouth at bedtime. 02/08/23   Shon Hale, MD                                                                                                                                    Past Surgical  History Past Surgical History:  Procedure Laterality Date   Bilateral hernia surgery     2006, 2007   CHOLECYSTECTOMY     2016    COLONOSCOPY WITH PROPOFOL N/A 06/28/2021   Procedure: COLONOSCOPY WITH PROPOFOL;  Surgeon: Malissa Hippo, MD;  Location: [facility];  Service: Endoscopy;  Laterality: N/A;  1020   ESOPHAGEAL DILATION N/A 11/06/2019   Procedure: ESOPHAGEAL DILATION;  Surgeon:  Dolores Frame, MD;  Location: [facility];  Service: Gastroenterology;  Laterality: N/A;   ESOPHAGOGASTRODUODENOSCOPY (EGD) WITH PROPOFOL N/A 11/06/2019   Procedure: ESOPHAGOGASTRODUODENOSCOPY (EGD) WITH PROPOFOL;  Surgeon: Dolores Frame, MD;  Location: [facility];  Service: Gastroenterology;  Laterality: N/A;  1045   IR RADIOLOGIST EVAL & MGMT  02/19/2022   LAPAROSCOPIC ASSISTED VENTRAL HERNIA REPAIR     Polyp removed     in January of 2018.    POLYPECTOMY  06/28/2021   Procedure: POLYPECTOMY INTESTINAL;  Surgeon: Malissa Hippo, MD;  Location: [facility];  Service: Endoscopy;;   RIGHT HEART CATH N/A 01/08/2022   Procedure: RIGHT HEART CATH;  Surgeon: Corky Crafts, MD;  Location: [facility] INVASIVE CV LAB;  Service: Cardiovascular;  Laterality: N/A;   RIGHT HEART CATH N/A 01/29/2023   Procedure: RIGHT HEART CATH;  Surgeon: Laurey Morale, MD;  Location: [facility] INVASIVE CV LAB;  Service: Cardiovascular;  Laterality: N/A;   Family History Family History  Problem Relation Age of Onset   Liver cancer Mother    Heart disease Mother    Aneurysm Sister     Social History Social History   Tobacco Use   Smoking status: Former    Current packs/day: 0.00    Average packs/day: 1 pack/day for 20.0 years (20.0 ttl pk-yrs)    Types: Cigarettes    Start date: 46    Quit date: 2017    Years since quitting: 8.2    Passive exposure: Current   Smokeless tobacco: Never  Vaping Use   Vaping status: Never Used  Substance Use Topics   Alcohol use: No   Drug use: No    Allergies Patient has no known allergies.  Review of Systems Review of Systems  Physical Exam Vital Signs  I have reviewed the triage vital signs BP (!) 100/49   Pulse (!) 57   Temp (!) 97.5 F (36.4 C)   Resp 16   Ht 6\' 2"  (1.88 m)   Wt (!) 154 kg   SpO2 100%   BMI 43.59 kg/m   Physical Exam Vitals and nursing note reviewed.  Constitutional:      Appearance: He is ill-appearing.  HENT:     Head: Normocephalic.  Eyes:     Pupils: Pupils are equal, round, and reactive to light.  Cardiovascular:     Rate and Rhythm: Normal rate.  Pulmonary:     Effort: Pulmonary effort is normal.  Abdominal:     General: There is distension.     Comments: Positive fluid wave  Neurological:     Comments: No nystagmus, no cranial nerve deficits.  5 5 strength in bilateral upper and lower extremities.  Alert and oriented x 3.  No asterixis present.     ED Results and Treatments Labs (all labs ordered are listed, but only abnormal results are displayed) Labs Reviewed  COMPREHENSIVE METABOLIC PANEL - Abnormal; Notable for the following components:      Result Value   Sodium 132 (*)    Chloride 96 (*)    Glucose, Bld 162 (*)    BUN 25 (*)    Creatinine, Ser 2.36 (*)    Calcium 8.7 (*)    Total Protein 6.4 (*)    Albumin 2.7 (*)    AST 45 (*)    Total Bilirubin 3.8 (*)    GFR, Estimated 30 (*)    All other components within normal limits  CBC WITH DIFFERENTIAL/PLATELET - Abnormal;  Notable for the following components:   WBC 3.3 (*)    RBC 3.04 (*)    Hemoglobin 8.4 (*)    HCT 26.9 (*)    RDW 18.5 (*)    Platelets 55 (*)    Lymphs Abs 0.6 (*)    All other components within normal limits  AMMONIA - Abnormal; Notable for the following components:   Ammonia 53 (*)    All other components within normal limits  CBG MONITORING, ED                                                                                                                          Radiology CT Head Wo  Contrast Result Date: 06/11/2023 CLINICAL DATA:  Delirium. Generalized weakness, hypotension, and dizziness. EXAM: CT HEAD WITHOUT CONTRAST TECHNIQUE: Contiguous axial images were obtained from the base of the skull through the vertex without intravenous contrast. RADIATION DOSE REDUCTION: This exam was performed according to the departmental dose-optimization program which includes automated exposure control, adjustment of the mA and/or kV according to patient size and/or use of iterative reconstruction technique. COMPARISON:  Head CT 05/28/2023 FINDINGS: Brain: There is no evidence of an acute infarct, intracranial hemorrhage, mass, midline shift, or extra-axial fluid collection. Cerebral volume is normal. The ventricles are normal in size. Vascular: No hyperdense vessel. Skull: No fracture or suspicious lesion. Sinuses/Orbits: Mild-to-moderate right frontal and right ethmoid sinus mucosal thickening. Clear mastoid air cells. Unremarkable orbits. Other: None. IMPRESSION: Unremarkable CT appearance of the brain. Electronically Signed   By: Sebastian Ache M.D.   On: 06/11/2023 17:30    Pertinent labs & imaging results that were available during my care of the patient were reviewed by me and considered in my medical decision making (see MDM for details).  Medications Ordered in ED Medications  lidocaine-EPINEPHrine (XYLOCAINE W/EPI) 2 %-1:200000 (PF) injection 20 mL (has no administration in time range)  sodium chloride 0.9 % bolus 500 mL (0 mLs Intravenous Stopped 06/11/23 1722)                                                                                                                                     Procedures Procedures  (including critical care time)  Medical Decision Making / ED Course   This patient presents to the ED for concern of weakness, this involves an extensive number of treatment options, and is a complaint  that carries with it a high risk of complications and morbidity.  The  differential diagnosis includes hepatic encephalopathy, ICH, considered SBP.  MDM: On exam, patient is chronically ill-appearing.  Blood pressure on the softer side, however looking through his previous vital signs, patient's BP has been in this range.  Will provide him with 500 cc IV fluids.  Will obtain imaging of patient's head.  Ammonia level ordered.  Asterixis is noted on exam.  Patient without significant ascites on my bedside ultrasound, do not see a pocket to drain.  Reassessment 6:30 PM-patient responded well to IV fluids, current BP 110/60.  Patient mentating appropriately.  He does not have any evidence of hepatic encephalopathy.  My dependent review the patient's head CT shows no intracranial hemorrhage.  Patient's blood work reviewed, renal function is at baseline.  His hemoglobin is 8.4, baseline appears to be about 9.  His platelets are 55 which is within his usual range.  His ammonia is only 53, patient is not showing any signs of hepatic encephalopathy on exam.  Believe this is likely chronic in nature.  Patient ambulatory with his cane in our emergency room.  Patient lives alone at home, however he is frequent home health visitors, has a life alert that he keeps on his person at all times.  He feels safe to go home and would like to.  He will follow-up with his PCP this week  Additional history obtained: -Additional history obtained from  -External records from outside source obtained and reviewed including: Chart review including previous notes, labs, imaging, consultation notes   Lab Tests: -I ordered, reviewed, and interpreted labs.   The pertinent results include:   Labs Reviewed  COMPREHENSIVE METABOLIC PANEL - Abnormal; Notable for the following components:      Result Value   Sodium 132 (*)    Chloride 96 (*)    Glucose, Bld 162 (*)    BUN 25 (*)    Creatinine, Ser 2.36 (*)    Calcium 8.7 (*)    Total Protein 6.4 (*)    Albumin 2.7 (*)    AST 45 (*)    Total  Bilirubin 3.8 (*)    GFR, Estimated 30 (*)    All other components within normal limits  CBC WITH DIFFERENTIAL/PLATELET - Abnormal; Notable for the following components:   WBC 3.3 (*)    RBC 3.04 (*)    Hemoglobin 8.4 (*)    HCT 26.9 (*)    RDW 18.5 (*)    Platelets 55 (*)    Lymphs Abs 0.6 (*)    All other components within normal limits  AMMONIA - Abnormal; Notable for the following components:   Ammonia 53 (*)    All other components within normal limits  CBG MONITORING, ED      EKG   EKG Interpretation Date/Time:    Ventricular Rate:    PR Interval:    QRS Duration:    QT Interval:    QTC Calculation:   R Axis:      Text Interpretation:           Imaging Studies ordered: I ordered imaging studies including CT imaging of the head I independently visualized and interpreted imaging. I agree with the radiologist interpretation   Medicines ordered and prescription drug management: Meds ordered this encounter  Medications   sodium chloride 0.9 % bolus 500 mL   lidocaine-EPINEPHrine (XYLOCAINE W/EPI) 2 %-1:200000 (PF) injection 20 mL    -I have reviewed  the patients home medicines and have made adjustments as needed    Cardiac Monitoring: The patient was maintained on a cardiac monitor.  I personally viewed and interpreted the cardiac monitored which showed an underlying rhythm of: Normal sinus rhythm  Social Determinants of Health:  Factors impacting patients care include: Multiple medical comorbidities including Elita Boone cirrhosis, chronic kidney disease, chronic anemia   Reevaluation: After the interventions noted above, I reevaluated the patient and found that they have :improved  Co morbidities that complicate the patient evaluation  Past Medical History:  Diagnosis Date   Acute pancreatitis 10/02/2022   Anasarca 01/23/2022   Anemia    Anxiety    Asthma    CHF (congestive heart failure) (HCC)    CKD (chronic kidney disease)    D-dimer, elevated  01/22/2022   Decompensation of cirrhosis of liver (HCC) 05/21/2022   Depression    Diabetes mellitus without complication (HCC)    Dyspnea    Hepatic encephalopathy (HCC) 07/10/2022   Hypertension    Hypothyroidism    Liver cirrhosis secondary to NASH (HCC)    NASH (nonalcoholic steatohepatitis)    Other ascites 06/19/2019   Pancytopenia (HCC) 05/21/2022   Pre-diabetes    Spleen enlarged    Thrombocytopenia (HCC)       Dispostion: I considered admission for this patient, however patient's workup was overall benign, he would like to go home, and he has close follow-up.     Final Clinical Impression(s) / ED Diagnoses Final diagnoses:  Weakness  Dehydration  Anemia, unspecified type     @PCDICTATION @    Anders Simmonds T, DO 06/11/23 1834

## 2023-06-11 NOTE — Discharge Instructions (Signed)
 While you are in the emergency room, you had blood work done that was overall normal for you.  Your CT scan was normal.  Make sure you are drinking plenty of water over the next few days.  I would like you to follow-up with your primary care doctor this week.  Return to emergency department if you feel you have a difficult time walking at home, feel weak or lightheaded.

## 2023-06-18 DIAGNOSIS — H2513 Age-related nuclear cataract, bilateral: Secondary | ICD-10-CM | POA: Diagnosis not present

## 2023-06-19 ENCOUNTER — Telehealth (HOSPITAL_COMMUNITY): Payer: Self-pay

## 2023-06-19 ENCOUNTER — Telehealth: Payer: Self-pay

## 2023-06-19 NOTE — Telephone Encounter (Signed)
 Called to confirm/remind patient of their appointment [facility] on 06/[facility]/23. However, pt needed to cancel his appointment and will call back to reschedule.

## 2023-06-19 NOTE — Telephone Encounter (Signed)
 Pt is needing refills on his lactulose. Pt was last seen on 04/25/2023.

## 2023-06-19 NOTE — Telephone Encounter (Signed)
error 

## 2023-06-20 ENCOUNTER — Encounter (HOSPITAL_COMMUNITY)

## 2023-06-20 DIAGNOSIS — M5440 Lumbago with sciatica, unspecified side: Secondary | ICD-10-CM | POA: Diagnosis not present

## 2023-06-20 DIAGNOSIS — S32030K Wedge compression fracture of third lumbar vertebra, subsequent encounter for fracture with nonunion: Secondary | ICD-10-CM | POA: Diagnosis not present

## 2023-06-25 ENCOUNTER — Other Ambulatory Visit: Payer: Self-pay

## 2023-06-25 ENCOUNTER — Emergency Department (HOSPITAL_COMMUNITY)

## 2023-06-25 ENCOUNTER — Inpatient Hospital Stay (HOSPITAL_COMMUNITY)
Admission: EM | Admit: 2023-06-25 | Discharge: 2023-06-28 | DRG: 291 | Disposition: A | Discharge status: Home Health | Attending: Family Medicine | Admitting: Family Medicine

## 2023-06-25 ENCOUNTER — Encounter (HOSPITAL_COMMUNITY): Payer: Self-pay

## 2023-06-25 DIAGNOSIS — N1831 Chronic kidney disease, stage 3a: Secondary | ICD-10-CM | POA: Diagnosis present

## 2023-06-25 DIAGNOSIS — Z79890 Hormone replacement therapy (postmenopausal): Secondary | ICD-10-CM

## 2023-06-25 DIAGNOSIS — R600 Localized edema: Principal | ICD-10-CM

## 2023-06-25 DIAGNOSIS — D649 Anemia, unspecified: Secondary | ICD-10-CM

## 2023-06-25 DIAGNOSIS — K7460 Unspecified cirrhosis of liver: Secondary | ICD-10-CM | POA: Diagnosis present

## 2023-06-25 DIAGNOSIS — E039 Hypothyroidism, unspecified: Secondary | ICD-10-CM | POA: Diagnosis present

## 2023-06-25 DIAGNOSIS — N400 Enlarged prostate without lower urinary tract symptoms: Secondary | ICD-10-CM | POA: Diagnosis present

## 2023-06-25 DIAGNOSIS — E1165 Type 2 diabetes mellitus with hyperglycemia: Secondary | ICD-10-CM | POA: Diagnosis present

## 2023-06-25 DIAGNOSIS — D696 Thrombocytopenia, unspecified: Secondary | ICD-10-CM | POA: Diagnosis present

## 2023-06-25 DIAGNOSIS — I130 Hypertensive heart and chronic kidney disease with heart failure and stage 1 through stage 4 chronic kidney disease, or unspecified chronic kidney disease: Principal | ICD-10-CM | POA: Diagnosis present

## 2023-06-25 DIAGNOSIS — F418 Other specified anxiety disorders: Secondary | ICD-10-CM | POA: Diagnosis present

## 2023-06-25 DIAGNOSIS — F32A Depression, unspecified: Secondary | ICD-10-CM | POA: Diagnosis present

## 2023-06-25 DIAGNOSIS — E1122 Type 2 diabetes mellitus with diabetic chronic kidney disease: Secondary | ICD-10-CM | POA: Diagnosis present

## 2023-06-25 DIAGNOSIS — Z79899 Other long term (current) drug therapy: Secondary | ICD-10-CM

## 2023-06-25 DIAGNOSIS — Z1152 Encounter for screening for COVID-19: Secondary | ICD-10-CM

## 2023-06-25 DIAGNOSIS — I5033 Acute on chronic diastolic (congestive) heart failure: Secondary | ICD-10-CM | POA: Diagnosis present

## 2023-06-25 DIAGNOSIS — Z8719 Personal history of other diseases of the digestive system: Secondary | ICD-10-CM

## 2023-06-25 DIAGNOSIS — N189 Chronic kidney disease, unspecified: Secondary | ICD-10-CM

## 2023-06-25 DIAGNOSIS — E66813 Obesity, class 3: Secondary | ICD-10-CM | POA: Diagnosis present

## 2023-06-25 DIAGNOSIS — E8770 Fluid overload, unspecified: Secondary | ICD-10-CM | POA: Diagnosis present

## 2023-06-25 DIAGNOSIS — K7682 Hepatic encephalopathy: Secondary | ICD-10-CM | POA: Diagnosis present

## 2023-06-25 DIAGNOSIS — Z66 Do not resuscitate: Secondary | ICD-10-CM | POA: Diagnosis present

## 2023-06-25 DIAGNOSIS — D61818 Other pancytopenia: Secondary | ICD-10-CM | POA: Diagnosis present

## 2023-06-25 DIAGNOSIS — I5082 Biventricular heart failure: Secondary | ICD-10-CM | POA: Diagnosis present

## 2023-06-25 DIAGNOSIS — Z6841 Body Mass Index (BMI) 40.0 and over, adult: Secondary | ICD-10-CM

## 2023-06-25 DIAGNOSIS — K219 Gastro-esophageal reflux disease without esophagitis: Secondary | ICD-10-CM | POA: Diagnosis present

## 2023-06-25 DIAGNOSIS — Z87891 Personal history of nicotine dependence: Secondary | ICD-10-CM

## 2023-06-25 DIAGNOSIS — J45909 Unspecified asthma, uncomplicated: Secondary | ICD-10-CM | POA: Diagnosis present

## 2023-06-25 DIAGNOSIS — Z8249 Family history of ischemic heart disease and other diseases of the circulatory system: Secondary | ICD-10-CM

## 2023-06-25 DIAGNOSIS — K7581 Nonalcoholic steatohepatitis (NASH): Secondary | ICD-10-CM | POA: Diagnosis present

## 2023-06-25 DIAGNOSIS — G4733 Obstructive sleep apnea (adult) (pediatric): Secondary | ICD-10-CM | POA: Diagnosis present

## 2023-06-25 DIAGNOSIS — F419 Anxiety disorder, unspecified: Secondary | ICD-10-CM | POA: Diagnosis present

## 2023-06-25 DIAGNOSIS — Z9049 Acquired absence of other specified parts of digestive tract: Secondary | ICD-10-CM

## 2023-06-25 DIAGNOSIS — I509 Heart failure, unspecified: Secondary | ICD-10-CM

## 2023-06-25 DIAGNOSIS — E871 Hypo-osmolality and hyponatremia: Secondary | ICD-10-CM | POA: Diagnosis present

## 2023-06-25 DIAGNOSIS — N183 Chronic kidney disease, stage 3 unspecified: Secondary | ICD-10-CM | POA: Diagnosis present

## 2023-06-25 LAB — CBC WITH DIFFERENTIAL/PLATELET
Abs Immature Granulocytes: 0.01 10*3/uL (ref 0.00–0.07)
Basophils Absolute: 0 10*3/uL (ref 0.0–0.1)
Basophils Relative: 1 %
Eosinophils Absolute: 0.3 10*3/uL (ref 0.0–0.5)
Eosinophils Relative: 10 %
HCT: 26.5 % — ABNORMAL LOW (ref 39.0–52.0)
Hemoglobin: 8.4 g/dL — ABNORMAL LOW (ref 13.0–17.0)
Immature Granulocytes: 0 %
Lymphocytes Relative: 20 %
Lymphs Abs: 0.5 10*3/uL — ABNORMAL LOW (ref 0.7–4.0)
MCH: 28.4 pg (ref 26.0–34.0)
MCHC: 31.7 g/dL (ref 30.0–36.0)
MCV: 89.5 fL (ref 80.0–100.0)
Monocytes Absolute: 0.2 10*3/uL (ref 0.1–1.0)
Monocytes Relative: 6 %
Neutro Abs: 1.7 10*3/uL (ref 1.7–7.7)
Neutrophils Relative %: 63 %
Platelets: 66 10*3/uL — ABNORMAL LOW (ref 150–400)
RBC: 2.96 MIL/uL — ABNORMAL LOW (ref 4.22–5.81)
RDW: 17.3 % — ABNORMAL HIGH (ref 11.5–15.5)
Smear Review: DECREASED
WBC: 2.7 10*3/uL — ABNORMAL LOW (ref 4.0–10.5)
nRBC: 0 % (ref 0.0–0.2)

## 2023-06-25 LAB — BRAIN NATRIURETIC PEPTIDE: B Natriuretic Peptide: 33 pg/mL (ref 0.0–100.0)

## 2023-06-25 LAB — URINALYSIS, ROUTINE W REFLEX MICROSCOPIC
Bacteria, UA: NONE SEEN
Bilirubin Urine: NEGATIVE
Glucose, UA: 150 mg/dL — AB
Ketones, ur: NEGATIVE mg/dL
Leukocytes,Ua: NEGATIVE
Nitrite: NEGATIVE
Protein, ur: NEGATIVE mg/dL
Specific Gravity, Urine: 1.011 (ref 1.005–1.030)
pH: 5 (ref 5.0–8.0)

## 2023-06-25 LAB — GLUCOSE, CAPILLARY
Glucose-Capillary: 136 mg/dL — ABNORMAL HIGH (ref 70–99)
Glucose-Capillary: 171 mg/dL — ABNORMAL HIGH (ref 70–99)

## 2023-06-25 LAB — COMPREHENSIVE METABOLIC PANEL WITH GFR
ALT: 19 U/L (ref 0–44)
AST: 49 U/L — ABNORMAL HIGH (ref 15–41)
Albumin: 2.9 g/dL — ABNORMAL LOW (ref 3.5–5.0)
Alkaline Phosphatase: 105 U/L (ref 38–126)
Anion gap: 10 (ref 5–15)
BUN: 27 mg/dL — ABNORMAL HIGH (ref 8–23)
CO2: 25 mmol/L (ref 22–32)
Calcium: 8.8 mg/dL — ABNORMAL LOW (ref 8.9–10.3)
Chloride: 98 mmol/L (ref 98–111)
Creatinine, Ser: 2.13 mg/dL — ABNORMAL HIGH (ref 0.61–1.24)
GFR, Estimated: 34 mL/min — ABNORMAL LOW (ref >60)
Glucose, Bld: 168 mg/dL — ABNORMAL HIGH (ref 70–99)
Potassium: 3.4 mmol/L — ABNORMAL LOW (ref 3.5–5.1)
Sodium: 133 mmol/L — ABNORMAL LOW (ref 135–145)
Total Bilirubin: 5.1 mg/dL — ABNORMAL HIGH (ref 0.0–1.2)
Total Protein: 6.7 g/dL (ref 6.5–8.1)

## 2023-06-25 LAB — RESP PANEL BY RT-PCR (RSV, FLU A&B, COVID)  RVPGX2
Influenza A by PCR: NEGATIVE
Influenza B by PCR: NEGATIVE
Resp Syncytial Virus by PCR: NEGATIVE
SARS Coronavirus 2 by RT PCR: NEGATIVE

## 2023-06-25 LAB — TROPONIN I (HIGH SENSITIVITY)
Troponin I (High Sensitivity): 6 ng/L (ref <18)
Troponin I (High Sensitivity): 6 ng/L (ref <18)

## 2023-06-25 LAB — AMMONIA: Ammonia: 42 umol/L — ABNORMAL HIGH (ref 9–35)

## 2023-06-25 MED ORDER — FUROSEMIDE 10 MG/ML IJ SOLN
80.0000 mg | Freq: Two times a day (BID) | INTRAMUSCULAR | Status: DC
  Administered 2023-06-25 – 2023-06-27 (×5): 80 mg via INTRAVENOUS
  Filled 2023-06-25 (×6): qty 8

## 2023-06-25 MED ORDER — TRAZODONE HCL 50 MG PO TABS
50.0000 mg | ORAL_TABLET | Freq: Every day | ORAL | Status: DC
  Administered 2023-06-25 – 2023-06-27 (×3): 50 mg via ORAL
  Filled 2023-06-25 (×3): qty 1

## 2023-06-25 MED ORDER — ACETAMINOPHEN 650 MG RE SUPP: 650.0000 mg | Freq: Four times a day (QID) | RECTAL | Status: DC | PRN

## 2023-06-25 MED ORDER — INSULIN ASPART 100 UNIT/ML IJ SOLN
0.0000 [IU] | Freq: Three times a day (TID) | INTRAMUSCULAR | Status: DC
  Administered 2023-06-25 – 2023-06-27 (×3): 1 [IU] via SUBCUTANEOUS

## 2023-06-25 MED ORDER — HYDROXYZINE PAMOATE 50 MG PO CAPS: 50.0000 mg | ORAL_CAPSULE | Freq: Three times a day (TID) | ORAL | Status: DC | PRN

## 2023-06-25 MED ORDER — LEVOTHYROXINE SODIUM 75 MCG PO TABS
175.0000 ug | ORAL_TABLET | Freq: Every day | ORAL | Status: DC
  Administered 2023-06-26 – 2023-06-28 (×3): 175 ug via ORAL
  Filled 2023-06-25: qty 1
  Filled 2023-06-25: qty 3
  Filled 2023-06-25: qty 1

## 2023-06-25 MED ORDER — SPIRONOLACTONE 25 MG PO TABS
100.0000 mg | ORAL_TABLET | Freq: Every day | ORAL | Status: DC
  Administered 2023-06-26 – 2023-06-28 (×3): 100 mg via ORAL
  Filled 2023-06-25 (×3): qty 4

## 2023-06-25 MED ORDER — ACETAMINOPHEN 500 MG PO TABS
500.0000 mg | ORAL_TABLET | Freq: Four times a day (QID) | ORAL | Status: DC | PRN
  Administered 2023-06-28: 500 mg via ORAL
  Filled 2023-06-25: qty 1

## 2023-06-25 MED ORDER — ALBUMIN HUMAN 25 % IV SOLN
25.0000 g | Freq: Once | INTRAVENOUS | Status: AC
Start: 2023-06-25 — End: 2023-06-26
  Administered 2023-06-25: 25 g via INTRAVENOUS
  Filled 2023-06-25: qty 100

## 2023-06-25 MED ORDER — PANTOPRAZOLE SODIUM 40 MG PO TBEC
40.0000 mg | DELAYED_RELEASE_TABLET | Freq: Every day | ORAL | Status: DC
  Administered 2023-06-26 – 2023-06-28 (×3): 40 mg via ORAL
  Filled 2023-06-25 (×3): qty 1

## 2023-06-25 MED ORDER — ALBUTEROL SULFATE (2.5 MG/3ML) 0.083% IN NEBU: 3.0000 mL | INHALATION_SOLUTION | Freq: Four times a day (QID) | RESPIRATORY_TRACT | Status: DC | PRN

## 2023-06-25 MED ORDER — FUROSEMIDE 10 MG/ML IJ SOLN
20.0000 mg | Freq: Once | INTRAMUSCULAR | Status: AC
  Administered 2023-06-25: 20 mg via INTRAVENOUS
  Filled 2023-06-25: qty 2

## 2023-06-25 MED ORDER — MIDODRINE HCL 5 MG PO TABS
5.0000 mg | ORAL_TABLET | Freq: Three times a day (TID) | ORAL | Status: DC
  Administered 2023-06-25 – 2023-06-28 (×8): 5 mg via ORAL
  Filled 2023-06-25 (×8): qty 1

## 2023-06-25 MED ORDER — POTASSIUM CHLORIDE CRYS ER 20 MEQ PO TBCR
40.0000 meq | EXTENDED_RELEASE_TABLET | Freq: Once | ORAL | Status: AC
  Administered 2023-06-25: 40 meq via ORAL
  Filled 2023-06-25: qty 2

## 2023-06-25 MED ORDER — TAMSULOSIN HCL 0.4 MG PO CAPS
0.4000 mg | ORAL_CAPSULE | Freq: Every day | ORAL | Status: DC
  Administered 2023-06-25 – 2023-06-27 (×3): 0.4 mg via ORAL
  Filled 2023-06-25 (×3): qty 1

## 2023-06-25 MED ORDER — DAPAGLIFLOZIN PROPANEDIOL 10 MG PO TABS
10.0000 mg | ORAL_TABLET | Freq: Every day | ORAL | Status: DC
  Administered 2023-06-26 – 2023-06-28 (×3): 10 mg via ORAL
  Filled 2023-06-25 (×4): qty 1

## 2023-06-25 MED ORDER — INSULIN ASPART 100 UNIT/ML IJ SOLN
0.0000 [IU] | Freq: Every day | INTRAMUSCULAR | Status: DC
Start: 2023-06-25 — End: 2023-06-28

## 2023-06-25 MED ORDER — ORAL CARE MOUTH RINSE: 15.0000 mL | OROMUCOSAL | Status: DC | PRN

## 2023-06-25 MED ORDER — SODIUM CHLORIDE 0.9% FLUSH
3.0000 mL | Freq: Two times a day (BID) | INTRAVENOUS | Status: DC
  Administered 2023-06-25 – 2023-06-28 (×6): 3 mL via INTRAVENOUS

## 2023-06-25 MED ORDER — LACTULOSE 10 GM/15ML PO SOLN
30.0000 g | Freq: Three times a day (TID) | ORAL | Status: DC
  Administered 2023-06-26 – 2023-06-27 (×3): 30 g via ORAL
  Filled 2023-06-25 (×8): qty 60

## 2023-06-25 MED ORDER — HYDROXYZINE HCL 25 MG PO TABS
50.0000 mg | ORAL_TABLET | Freq: Three times a day (TID) | ORAL | Status: DC | PRN
  Administered 2023-06-26 – 2023-06-27 (×2): 50 mg via ORAL
  Filled 2023-06-25 (×2): qty 2

## 2023-06-25 MED ORDER — FUROSEMIDE 10 MG/ML IJ SOLN: 60.0000 mg | Freq: Once | INTRAMUSCULAR | Status: DC

## 2023-06-25 MED ORDER — SERTRALINE HCL 50 MG PO TABS
100.0000 mg | ORAL_TABLET | Freq: Every day | ORAL | Status: DC
  Administered 2023-06-26 – 2023-06-28 (×3): 100 mg via ORAL
  Filled 2023-06-25 (×3): qty 2

## 2023-06-25 MED ORDER — RIFAXIMIN 550 MG PO TABS
550.0000 mg | ORAL_TABLET | Freq: Two times a day (BID) | ORAL | Status: DC
  Administered 2023-06-25 – 2023-06-28 (×6): 550 mg via ORAL
  Filled 2023-06-25 (×6): qty 1

## 2023-06-25 NOTE — H&P (Signed)
 History and Physical    Patient: Daniel Crawford ZOX:[id number] DOB: 11-03-1955 DOA: 06/25/2023 DOS: the patient was seen and examined on 06/25/2023 PCP: [facility]  Patient coming from: Home  Chief Complaint:  Chief Complaint  Patient presents with   Leg Swelling   HPI: Daniel Crawford is a 68 y.o. male with a history of NASH cirrhosis with splenomegaly, thrombocytopenia, admission for hepatic encephalopathy last month, asthma, anxiety, HFpEF, stage IIIa CKD, T2DM, HTN, hypothyroidism who presented to the ED today for leg swelling. Despite taking his home diuretics in addition to other medications, he reports one week of gradual increase in girth of his legs extending into abdomen. Weighed 348lbs at home. He has chronic orthopnea that is no different, no dyspnea or chest pain or palpitations. His cognition has been waxing/waning over this past weekend but he's clear headed today. He's had no significant new bleeding, but still bruises easily, no abd pain, N/V. Has normal stool output on lactulose, but didn't take today to avoid loose stool in ED.  Discharge summary 3/5 reviewed reported mild chronic leg edema. Currently appears to have severe pitting dependent edema on presentation. Labs relatively stable from baseline, CXR clear, admission requested for anasarca.  Review of Systems: As mentioned in the history of present illness. All other systems reviewed and are negative. Past Medical History:  Diagnosis Date   Acute pancreatitis 10/02/2022   Anasarca 01/23/2022   Anemia    Anxiety    Asthma    CHF (congestive heart failure) (HCC)    CKD (chronic kidney disease)    D-dimer, elevated 01/22/2022   Decompensation of cirrhosis of liver (HCC) 05/21/2022   Depression    Diabetes mellitus without complication (HCC)    Dyspnea    Hepatic encephalopathy (HCC) 07/10/2022   Hypertension    Hypothyroidism    Liver cirrhosis secondary to NASH (HCC)    NASH (nonalcoholic  steatohepatitis)    Other ascites 06/19/2019   Pancytopenia (HCC) 05/21/2022   Pre-diabetes    Spleen enlarged    Thrombocytopenia (HCC)    Past Surgical History:  Procedure Laterality Date   Bilateral hernia surgery     2006, 2007   CHOLECYSTECTOMY     2016    COLONOSCOPY WITH PROPOFOL N/A 06/28/2021   Procedure: COLONOSCOPY WITH PROPOFOL;  Surgeon: Malissa Hippo, MD;  [facility];  Service: Endoscopy;  Laterality: N/A;  1020   ESOPHAGEAL DILATION N/A 11/06/2019   Procedure: ESOPHAGEAL DILATION;  Surgeon: Dolores Frame, MD;  Location: [facility];  Service: Gastroenterology;  Laterality: N/A;   ESOPHAGOGASTRODUODENOSCOPY (EGD) WITH PROPOFOL N/A 11/06/2019   Procedure: ESOPHAGOGASTRODUODENOSCOPY (EGD) WITH PROPOFOL;  Surgeon: Dolores Frame, MD;  Location: [facility];  Service: Gastroenterology;  Laterality: N/A;  1045   IR RADIOLOGIST EVAL & MGMT  02/19/2022   LAPAROSCOPIC ASSISTED VENTRAL HERNIA REPAIR     Polyp removed     in January of 2018.    POLYPECTOMY  06/28/2021   Procedure: POLYPECTOMY INTESTINAL;  Surgeon: Malissa Hippo, MD;  Location: [facility];  Service: Endoscopy;;   RIGHT HEART CATH N/A 01/08/2022   Procedure: RIGHT HEART CATH;  Surgeon: Corky Crafts, MD;  Location: [facility] INVASIVE CV LAB;  Service: Cardiovascular;  Laterality: N/A;   RIGHT HEART CATH N/A 01/29/2023   Procedure: RIGHT HEART CATH;  Surgeon: Laurey Morale, MD;  Location: [facility] INVASIVE CV LAB;  Service: Cardiovascular;  Laterality: N/A;   Social History:  reports  that he quit smoking about 8 years ago. His smoking use included cigarettes. He started smoking about 28 years ago. He has a 20 pack-year smoking history. He has been exposed to tobacco smoke. He has never used smokeless tobacco. He reports that he does not drink alcohol and does not use drugs.  No Known Allergies  Family History  Problem Relation Age of Onset   Liver cancer Mother    Heart  disease Mother    Aneurysm Sister     Prior to Admission medications   Medication Sig Start Date End Date Taking? Authorizing Provider  acetaminophen (TYLENOL) 325 MG tablet Take 2 tablets (650 mg total) by mouth every 6 (six) hours as needed for mild pain (pain score 1-3), fever or headache (or Fever >/= 101). 02/08/23   Shon Hale, MD  albuterol (VENTOLIN HFA) 108 (90 Base) MCG/ACT inhaler Inhale 2 puffs into the lungs every 6 (six) hours as needed for wheezing or shortness of breath. 02/08/23   Shon Hale, MD  Cholecalciferol (VITAMIN D3) 1.25 MG (50000 UT) CAPS Take 1 capsule by mouth once a week. Wednesdays 03/06/23   [provider]  dapagliflozin propanediol (FARXIGA) 10 MG TABS tablet Take 1 tablet (10 mg total) by mouth daily before breakfast. 02/08/23   Shon Hale, MD  diclofenac Sodium (VOLTAREN) 1 % GEL Apply 2 g topically in the morning and at bedtime. 1-2 inches topically twice daily 03/06/23   [provider]  hydrOXYzine (VISTARIL) 50 MG capsule Take 1 capsule (50 mg total) by mouth 3 (three) times daily as needed for anxiety or nausea. Patient taking differently: Take 50 mg by mouth in the morning and at bedtime. 02/08/23   Shon Hale, MD  lactose free nutrition (BOOST) LIQD Take 237 mLs by mouth every evening.    [provider]  lactulose (CHRONULAC) 10 GM/15ML solution Take 45 mLs (30 g total) by mouth 3 (three) times daily. 05/29/23   ShahmehdiGemma Payor, MD  levothyroxine (SYNTHROID) 175 MCG tablet Take 175 mcg by mouth daily. 05/24/23   [provider]  ondansetron (ZOFRAN) 4 MG tablet Take 4 mg by mouth 2 (two) times daily as needed for nausea or vomiting. 10/28/22   [provider]  pantoprazole (PROTONIX) 40 MG tablet Take 1 tablet (40 mg total) by mouth daily. 01/25/22   Shon Hale, MD  potassium chloride SA (KLOR-CON M) 20 MEQ tablet Take 2 tablets (40 mEq total) by mouth daily. 05/14/23   Johnson,  Clanford L, MD  rifaximin (XIFAXAN) 550 MG TABS tablet Take 1 tablet (550 mg total) by mouth 2 (two) times daily. 02/08/23   Shon Hale, MD  sertraline (ZOLOFT) 100 MG tablet Take 100 mg by mouth daily. 09/24/22   [provider]  spironolactone (ALDACTONE) 100 MG tablet Take 1 tablet (100 mg total) by mouth daily. 02/08/23   Shon Hale, MD  tamsulosin (FLOMAX) 0.4 MG CAPS capsule Take 1 capsule (0.4 mg total) by mouth daily after supper. 03/27/23   Vassie Loll, MD  torsemide (DEMADEX) 20 MG tablet Take 4 tablets (80 mg total) by mouth every morning AND 2 tablets (40 mg total) every evening. 05/06/23   Clegg, Amy D, NP  traZODone (DESYREL) 50 MG tablet Take 1 tablet (50 mg total) by mouth at bedtime. 02/08/23   Shon Hale, MD    Physical Exam: Vitals:   06/25/23 1400 06/25/23 1415 06/25/23 1430 06/25/23 1447  BP: 110/70 107/61 119/66 (!) 94/50  Pulse: 64 65 69  68  Resp: 18 13 13  (!) 21  Temp:      SpO2: 99% 99% 100% 100%  Weight:      Height:      Gen: Obese pleasant male in no distress Pulm: Clear, nonlabored  CV: RRR, no rub or murmur, no JVD, severe pitting symmetric edema of legs and including lower abdominal wall.  GI: Soft, NT, ND, +BS. No fluid wave Neuro: Alert and oriented. No asterixis or focal deficits. Ext: Warm, no deformities. Dry.  Skin: Mild jaundice with scattered UE ecchymoses. No rashes, lesions or ulcers on visualized skin   Data Reviewed: Na 133, K 3.4, Cr 2.13, BUN 27, glu 168. AST 49, TBili 5.1.  BNP 33 Troponin 6 > 6 WBC 2.7k (ANC 1.7k, ALC 500), hgb 8.4g/dl, plt 56O all near baseline UA with hyaline casts, CaOx crystals without RBCs, WBCs or bacteria. Dipstick bili neg.  Covid, flu, RSV neg.  CXR: No infiltrate, stable cardiomegaly. ECG: sinus bradycardia with PACs making vent rate avg ~65bpm  Assessment and Plan: Volume overload, acute on chronic HFpEF/RV failure, anasarca, HTN: Right heart catheterization by Dr. Shirlee Latch Nov 2024  showed high cardiac output, normal PA pressure, low PAPi suggesting primary RV failure. - Augment diuretic to lasix 80mg  IV planning BID (received 20mg  in ED). Will give K supplement as well. Given soft BP (which is baseline), will give albumin + IV lasix tonight and reassess need in AM.  - Support BP with midodrine - Continue farxiga - Monitor I/O, daily weights.   NASH cirrhosis: Reportedly not a liver transplant candidate per GI note 04/25/2023.  - IV lasix in place of torsemide, continue spironolactone and lactulose/rifaximin as above, low sodium diet - Follow up with [facility] after discharge - Limit TDD tylenol < 3 g.  History of hepatic encephalopathy: Ammonia slightly elevated but below prior level and mentation is at baseline.  - Continue home lactulose, rifaximin (didn't take lactulose yet today, so ordered to get 2 doses today). Aiming for 3 BM's per day.   Pancytopenia: Near baseline, no bleeding or fever. Given marginal plt count, will defer pharmacologic VTE prophylaxis in favor of frequent ambulation.  - Up with assistance, anticipate frequent trips to bathroom due to IV diuresis and lactulose.   Stage IIIa CKD:  - Monitor BMP with diuresis, avoid nephrotoxins as able.   T2DM: Last HbA1c 5.2%.  - Continue farxiga - Will cover with sensitive SSI, glucose 168 on arrival.   Hypothyroidism: TSH last admission was 6.781, though that was much improved from prior and free T4 was normal.  - Continue synthroid, suggest recheck TFTs after convalescence.  GERD:  - Continue PPI, last EGD showed portal hypertensive gastropathy without varices mentioned.   Anxiety:  - Continue SSRI, qHS trazodone.  BPH:  - Continue tamsulosin  Asthma: Quiescent.  - Continue prn albuterol   Advance Care Planning: Confirmed DNR/DNI  Consults: None  Family Communication: None at bedside  Severity of Illness: The appropriate patient status for this patient is INPATIENT. Inpatient status  is judged to be reasonable and necessary in order to provide the required intensity of service to ensure the patient's safety. The patient's presenting symptoms, physical exam findings, and initial radiographic and laboratory data in the context of their chronic comorbidities is felt to place them at high risk for further clinical deterioration. Furthermore, it is not anticipated that the patient will be medically stable for discharge from the hospital within 2 midnights of admission.   * I  certify that at the point of admission it is my clinical judgment that the patient will require inpatient hospital care spanning beyond 2 midnights from the point of admission due to high intensity of service, high risk for further deterioration and high frequency of surveillance required.*  Author: Tyrone Nine, MD 06/25/2023 3:22 PM  For on call review [email or web address].

## 2023-06-25 NOTE — ED Triage Notes (Signed)
 Pt arrived via POV c/o generalized edema. Pt reports he has eben here before for the same complaint.

## 2023-06-25 NOTE — ED Provider Notes (Signed)
 [place] EMERGENCY DEPARTMENT AT [facility] Provider Note   CSN: [id number] Arrival date & time: 06/25/23  1120     History  Chief Complaint  Patient presents with   Leg Swelling    Daniel Crawford is a 68 y.o. male.  Patient is a 68 year old male who presents emergency department the chief complaint of swelling to bilateral lower extremities, shortness of breath, generalized weakness.  Patient notes that he has been compliant with his home furosemide.  He notes despite this he has continued to have weight gain and has been retaining fluid.  He denies any associated abdominal pain, nausea, vomiting, diarrhea.  He has had no associated dizziness, lightheadedness or syncope.        Home Medications Prior to Admission medications   Medication Sig Start Date End Date Taking? Authorizing Provider  acetaminophen (TYLENOL) 325 MG tablet Take 2 tablets (650 mg total) by mouth every 6 (six) hours as needed for mild pain (pain score 1-3), fever or headache (or Fever >/= 101). 02/08/23   Shon Hale, MD  albuterol (VENTOLIN HFA) 108 (90 Base) MCG/ACT inhaler Inhale 2 puffs into the lungs every 6 (six) hours as needed for wheezing or shortness of breath. 02/08/23   Shon Hale, MD  Cholecalciferol (VITAMIN D3) 1.25 MG (50000 UT) CAPS Take 1 capsule by mouth once a week. Wednesdays 03/06/23   [provider]  dapagliflozin propanediol (FARXIGA) 10 MG TABS tablet Take 1 tablet (10 mg total) by mouth daily before breakfast. 02/08/23   Shon Hale, MD  diclofenac Sodium (VOLTAREN) 1 % GEL Apply 2 g topically in the morning and at bedtime. 1-2 inches topically twice daily 03/06/23   [provider]  hydrOXYzine (VISTARIL) 50 MG capsule Take 1 capsule (50 mg total) by mouth 3 (three) times daily as needed for anxiety or nausea. Patient taking differently: Take 50 mg by mouth in the morning and at bedtime. 02/08/23   Shon Hale, MD  lactose free nutrition  (BOOST) LIQD Take 237 mLs by mouth every evening.    [provider]  lactulose (CHRONULAC) 10 GM/15ML solution Take 45 mLs (30 g total) by mouth 3 (three) times daily. 05/29/23   ShahmehdiGemma Payor, MD  levothyroxine (SYNTHROID) 175 MCG tablet Take 175 mcg by mouth daily. 05/24/23   [provider]  ondansetron (ZOFRAN) 4 MG tablet Take 4 mg by mouth 2 (two) times daily as needed for nausea or vomiting. 10/28/22   [provider]  pantoprazole (PROTONIX) 40 MG tablet Take 1 tablet (40 mg total) by mouth daily. 01/25/22   Shon Hale, MD  potassium chloride SA (KLOR-CON M) 20 MEQ tablet Take 2 tablets (40 mEq total) by mouth daily. 05/14/23   Johnson, Clanford L, MD  rifaximin (XIFAXAN) 550 MG TABS tablet Take 1 tablet (550 mg total) by mouth 2 (two) times daily. 02/08/23   Shon Hale, MD  sertraline (ZOLOFT) 100 MG tablet Take 100 mg by mouth daily. 09/24/22   [provider]  spironolactone (ALDACTONE) 100 MG tablet Take 1 tablet (100 mg total) by mouth daily. 02/08/23   Shon Hale, MD  tamsulosin (FLOMAX) 0.4 MG CAPS capsule Take 1 capsule (0.4 mg total) by mouth daily after supper. 03/27/23   Vassie Loll, MD  torsemide (DEMADEX) 20 MG tablet Take 4 tablets (80 mg total) by mouth every morning AND 2 tablets (40 mg total) every evening. 05/06/23   Clegg, Amy D, NP  traZODone (DESYREL) 50 MG tablet Take 1  tablet (50 mg total) by mouth at bedtime. 02/08/23   Shon Hale, MD      Allergies    Patient has no known allergies.    Review of Systems   Review of Systems  Respiratory:  Positive for shortness of breath.   Cardiovascular:  Positive for leg swelling.  All other systems reviewed and are negative.   Physical Exam Updated Vital Signs BP (!) 100/57   Pulse 65   Temp (!) 96.8 F (36 C)   Resp 17   Ht 6\' 2"  (1.88 m)   Wt (!) 157.9 kg   SpO2 99%   BMI 44.68 kg/m  Physical Exam Vitals and nursing note reviewed.  Constitutional:       Appearance: Normal appearance.  HENT:     Head: Normocephalic and atraumatic.     Nose: Nose normal.     Mouth/Throat:     Mouth: Mucous membranes are moist.  Eyes:     Extraocular Movements: Extraocular movements intact.     Conjunctiva/sclera: Conjunctivae normal.     Pupils: Pupils are equal, round, and reactive to light.  Cardiovascular:     Rate and Rhythm: Normal rate and regular rhythm.     Pulses: Normal pulses.     Heart sounds: Normal heart sounds.  Pulmonary:     Effort: Pulmonary effort is normal. No respiratory distress.     Breath sounds: Rales present. No wheezing.  Abdominal:     General: Abdomen is flat. Bowel sounds are normal.     Palpations: Abdomen is soft.     Tenderness: There is no abdominal tenderness. There is no guarding.  Musculoskeletal:        General: Normal range of motion.     Cervical back: Normal range of motion and neck supple.     Comments: 4+ edema to bilateral lower extremities  Skin:    General: Skin is warm and dry.     Findings: No rash.  Neurological:     General: No focal deficit present.     Mental Status: He is alert and oriented to person, place, and time. Mental status is at baseline.     Cranial Nerves: No cranial nerve deficit.     Sensory: No sensory deficit.     Motor: No weakness.     Coordination: Coordination normal.  Psychiatric:        Mood and Affect: Mood normal.        Behavior: Behavior normal.        Thought Content: Thought content normal.        Judgment: Judgment normal.     ED Results / Procedures / Treatments   Labs (all labs ordered are listed, but only abnormal results are displayed) Labs Reviewed  COMPREHENSIVE METABOLIC PANEL WITH GFR - Abnormal; Notable for the following components:      Result Value   Sodium 133 (*)    Potassium 3.4 (*)    Glucose, Bld 168 (*)    BUN 27 (*)    Creatinine, Ser 2.13 (*)    Calcium 8.8 (*)    Albumin 2.9 (*)    AST 49 (*)    Total Bilirubin 5.1 (*)    GFR,  Estimated 34 (*)    All other components within normal limits  CBC WITH DIFFERENTIAL/PLATELET - Abnormal; Notable for the following components:   WBC 2.7 (*)    RBC 2.96 (*)    Hemoglobin 8.4 (*)    HCT 26.5 (*)  RDW 17.3 (*)    Platelets 66 (*)    Lymphs Abs 0.5 (*)    All other components within normal limits  AMMONIA - Abnormal; Notable for the following components:   Ammonia 42 (*)    All other components within normal limits  RESP PANEL BY RT-PCR (RSV, FLU A&B, COVID)  RVPGX2  BRAIN NATRIURETIC PEPTIDE  URINALYSIS, ROUTINE W REFLEX MICROSCOPIC  TROPONIN I (HIGH SENSITIVITY)  TROPONIN I (HIGH SENSITIVITY)    EKG EKG Interpretation Date/Time:  Tuesday June 25 2023 11:47:40 EDT Ventricular Rate:  64 PR Interval:  187 QRS Duration:  108 QT Interval:  507 QTC Calculation: 524 R Axis:   28  Text Interpretation: Sinus rhythm Atrial premature complexes Low voltage, precordial leads Prolonged QT interval Confirmed by Gloris Manchester [phone]) on 06/25/2023 12:08:56 PM  Radiology No results found.  Procedures Procedures    Medications Ordered in ED Medications  midodrine (PROAMATINE) tablet 5 mg (has no administration in time range)  furosemide (LASIX) injection 20 mg (20 mg Intravenous Given 06/25/23 1224)    ED Course/ Medical Decision Making/ A&P                                 Medical Decision Making Amount and/or Complexity of Data Reviewed Labs: ordered. Radiology: ordered.  Risk Prescription drug management. Decision regarding hospitalization.   This patient presents to the ED for concern of shortness of breath, edema, weakness, this involves an extensive number of treatment options, and is a complaint that carries with it a high risk of complications and morbidity.  The differential diagnosis includes acute CHF, cirrhosis, hepatic encephalopathy, acute kidney injury, anemia, sepsis, pneumonia   Co morbidities that complicate the patient evaluation  CHF and  cirrhosis   Additional history obtained:  Additional history obtained from records External records from outside source obtained and reviewed including none   Lab Tests:  I Ordered, and personally interpreted labs.  The pertinent results include: Leukopenia, thrombocytopenia, anemia, elevated ammonia, elevated creatinine, normal troponin, normal BNP, unremarkable urinalysis   Imaging Studies ordered:  I ordered imaging studies including chest x-ray I independently visualized and interpreted imaging which showed no acute cardiopulmonary process I agree with the radiologist interpretation   Cardiac Monitoring: / EKG:  The patient was maintained on a cardiac monitor.  I personally viewed and interpreted the cardiac monitored which showed an underlying rhythm of: Normal sinus rhythm, no ST/T wave changes, no ischemic changes, no STEMI   Consultations Obtained:  I requested consultation with the hospitalist,  and discussed lab and imaging findings as well as pertinent plan - they recommend: Admission   Problem List / ED Course / Critical interventions / Medication management  Patient does remain stable at this time.  Patient does have a chronically low blood pressure but was given a dose of midodrine as it was lower than baseline on initial presentation.  Have started him on diuretics as well.  Chest x-ray demonstrated no indication for pulmonary edema.  Blood work has a normal BNP and troponin.  EKG has no acute ischemic changes.  Anemia is at baseline and creatinine is at baseline.  Urinalysis demonstrates no indication of urinary tract infection.  Did discuss patient case with Dr. Jarvis Newcomer with the hospitalist service who has excepted for admission at this time for continued diuresis. I ordered medication including midodrine, Lasix for tension and fluid overload Reevaluation of the patient after these  medicines showed that the patient improved I have reviewed the patients home  medicines and have made adjustments as needed   Social Determinants of Health:  None   Test / Admission - Considered:  Admission        Final Clinical Impression(s) / ED Diagnoses Final diagnoses:  None    Rx / DC Orders ED Discharge Orders     None         Kathlen Mody 06/25/23 1522    Gloris Manchester, MD 06/25/23 1732

## 2023-06-25 NOTE — Progress Notes (Signed)
 Mobility Specialist Progress Note:    06/25/23 1601  Mobility  Activity Ambulated with assistance in room;Ambulated with assistance to bathroom  Level of Assistance Contact guard assist, steadying assist  Assistive Device [employer] Ambulated (ft) 25 ft  Range of Motion/Exercises Active;All extremities  Activity Response Tolerated well  Mobility Referral Yes  Mobility visit 1 Mobility  Mobility Specialist Start Time (ACUTE ONLY) 1545  Mobility Specialist Stop Time (ACUTE ONLY) 1601  Mobility Specialist Time Calculation (min) (ACUTE ONLY) 16 min   Pt received requesting assistance to bathroom. Required CGA to stand and SBA to ambulate with cane. Tolerated well, asx throughout. Returned pt sitting EOB, nursing staff and MD in room. All needs met.  Lawerance Bach Mobility Specialist Please contact via Special educational needs teacher or  Rehab office at [phone]

## 2023-06-26 DIAGNOSIS — I5033 Acute on chronic diastolic (congestive) heart failure: Secondary | ICD-10-CM | POA: Diagnosis not present

## 2023-06-26 LAB — GLUCOSE, CAPILLARY
Glucose-Capillary: 110 mg/dL — ABNORMAL HIGH (ref 70–99)
Glucose-Capillary: 139 mg/dL — ABNORMAL HIGH (ref 70–99)
Glucose-Capillary: 115 mg/dL — ABNORMAL HIGH (ref 70–99)
Glucose-Capillary: 120 mg/dL — ABNORMAL HIGH (ref 70–99)

## 2023-06-26 LAB — COMPREHENSIVE METABOLIC PANEL WITH GFR
ALT: 16 U/L (ref 0–44)
AST: 38 U/L (ref 15–41)
Albumin: 2.5 g/dL — ABNORMAL LOW (ref 3.5–5.0)
Alkaline Phosphatase: 93 U/L (ref 38–126)
Anion gap: 8 (ref 5–15)
BUN: 26 mg/dL — ABNORMAL HIGH (ref 8–23)
CO2: 26 mmol/L (ref 22–32)
Calcium: 8.2 mg/dL — ABNORMAL LOW (ref 8.9–10.3)
Chloride: 100 mmol/L (ref 98–111)
Creatinine, Ser: 2.15 mg/dL — ABNORMAL HIGH (ref 0.61–1.24)
GFR, Estimated: 33 mL/min — ABNORMAL LOW (ref >60)
Glucose, Bld: 121 mg/dL — ABNORMAL HIGH (ref 70–99)
Potassium: 3.4 mmol/L — ABNORMAL LOW (ref 3.5–5.1)
Sodium: 134 mmol/L — ABNORMAL LOW (ref 135–145)
Total Bilirubin: 3.3 mg/dL — ABNORMAL HIGH (ref 0.0–1.2)
Total Protein: 5.6 g/dL — ABNORMAL LOW (ref 6.5–8.1)

## 2023-06-26 LAB — PROTIME-INR
INR: 1.8 — ABNORMAL HIGH (ref 0.8–1.2)
Prothrombin Time: 20.8 s — ABNORMAL HIGH (ref 11.4–15.2)

## 2023-06-26 LAB — CBC
HCT: 22.8 % — ABNORMAL LOW (ref 39.0–52.0)
Hemoglobin: 7.3 g/dL — ABNORMAL LOW (ref 13.0–17.0)
MCH: 28.2 pg (ref 26.0–34.0)
MCHC: 32 g/dL (ref 30.0–36.0)
MCV: 88 fL (ref 80.0–100.0)
Platelets: 56 10*3/uL — ABNORMAL LOW (ref 150–400)
RBC: 2.59 MIL/uL — ABNORMAL LOW (ref 4.22–5.81)
RDW: 17.2 % — ABNORMAL HIGH (ref 11.5–15.5)
WBC: 2.4 10*3/uL — ABNORMAL LOW (ref 4.0–10.5)
nRBC: 0 % (ref 0.0–0.2)

## 2023-06-26 MED ORDER — PROCHLORPERAZINE EDISYLATE 10 MG/2ML IJ SOLN
10.0000 mg | Freq: Four times a day (QID) | INTRAMUSCULAR | Status: AC | PRN
  Administered 2023-06-26: 10 mg via INTRAVENOUS
  Filled 2023-06-26: qty 2

## 2023-06-26 MED ORDER — POTASSIUM CHLORIDE CRYS ER 20 MEQ PO TBCR
40.0000 meq | EXTENDED_RELEASE_TABLET | ORAL | Status: AC
  Administered 2023-06-26 (×2): 40 meq via ORAL
  Filled 2023-06-26 (×2): qty 2

## 2023-06-26 NOTE — Progress Notes (Signed)
 PROGRESS NOTE  Daniel Crawford, is a 68 y.o. male, DOB - 03-12-1956, UXL:[id number]  Admit date - 06/25/2023   Admitting Physician Tyrone Nine, MD  Outpatient Primary MD for the patient is [facility]  LOS - 1  Chief Complaint  Patient presents with   Leg Swelling      Brief Narrative:   68 y.o. male with a history of NASH cirrhosis with splenomegaly, thrombocytopenia, admission for hepatic encephalopathy last month, asthma, anxiety, HFpEF, stage IIIa CKD, T2DM, HTN, hypothyroidism admitted with volume overload/acute on chronic diastolic dysfunction CHF with significant right-sided heart failure symptoms including anasarca    -Assessment and Plan: 1)acute on chronic HFpEF/acute on chronic diastolic dysfunction CHF with significant right-sided heart failure symptoms and anasarca - Right heart catheterization by Dr. Shirlee Latch Nov 2024 showed high cardiac output, normal PA pressure, low PAPi suggesting primary RV failure. - Augment diuretic to lasix 80mg  IV planning BID (received 20mg  in ED).   06/26/23 -Received albumin with Lasix Patient remains volume overloaded -Continue with diuresis - Support BP with midodrine   2)NASH cirrhosis: Reportedly not a liver transplant candidate per GI note 04/25/2023.  -06/26/23 -Hold PTA torsemide while on IV Lasix here -continue spironolactone and lactulose/rifaximin as above, low sodium diet - Follow up with [facility] after discharge - Limit TDD tylenol < 3 g.  3)History of hepatic encephalopathy: Ammonia slightly elevated but below prior level and mentation is at baseline.  - Continue home lactulose, rifaximin     4)Pancytopenia: Near baseline, no bleeding or fever. Given marginal plt count, will defer pharmacologic VTE prophylaxis in favor of frequent ambulation.  - Up with assistance, anticipate frequent trips to bathroom due to IV diuresis and lactulose.    Stage IIIa CKD:  - Monitor BMP with diuresis, avoid nephrotoxins  as able.    T2DM: Last HbA1c 5.2% reflecting excellent diabetic control PTA - Continue farxiga - Will cover with sensitive SSI, glucose 168 on arrival.    Hypothyroidism: TSH last admission was 6.781, though that was much improved from prior and free T4 was normal.  - Continue synthroid, suggest recheck TFTs after convalescence.   GERD:  - Continue PPI, last EGD showed portal hypertensive gastropathy without varices mentioned.    Anxiety:  - Continue SSRI, qHS trazodone.   BPH:  - Continue tamsulosin   Asthma: Quiescent.  - Continue prn albuterol    Generalized weakness and deconditioning--physical therapy evaluation requested and pending   Status is: Inpatient   Disposition: The patient is from: Home              Anticipated d/c is to:  To be determined after PT eval              Anticipated d/c date is: 1 day              Patient currently is not medically stable to d/c. Barriers: Not Clinically Stable-   Code Status :  -  Code Status: Limited: Do not attempt resuscitation (DNR) -DNR-LIMITED -Do Not Intubate/DNI    Family Communication:    NA (patient is alert, awake and coherent)   DVT Prophylaxis  :   - SCDs SCDs Start: 06/25/23 1628   Lab Results  Component Value Date   PLT 56 (L) 06/26/2023    Inpatient Medications  Scheduled Meds:  dapagliflozin propanediol  10 mg Oral QAC breakfast   furosemide  80 mg Intravenous BID   insulin aspart  0-5 Units Subcutaneous QHS  insulin aspart  0-9 Units Subcutaneous TID WC   lactulose  30 g Oral TID   levothyroxine  175 mcg Oral Q0600   midodrine  5 mg Oral TID WC   pantoprazole  40 mg Oral Daily   rifaximin  550 mg Oral BID   sertraline  100 mg Oral Daily   sodium chloride flush  3 mL Intravenous Q12H   spironolactone  100 mg Oral Daily   tamsulosin  0.4 mg Oral QPC supper   traZODone  50 mg Oral QHS   Continuous Infusions: PRN Meds:.acetaminophen **OR** acetaminophen, albuterol, hydrOXYzine, mouth rinse,  prochlorperazine   Anti-infectives (From admission, onward)    Start     Dose/Rate Route Frequency Ordered Stop   06/25/23 1800  rifaximin (XIFAXAN) tablet 550 mg        550 mg Oral 2 times daily 06/25/23 1633           Subjective: Aundra Aschoff today has no fevers, no emesis,  No chest pain,   - Voiding okay -Had a couple of BMs -Remains volume overloaded   Objective: Vitals:   06/26/23 0500 06/26/23 0800 06/26/23 1004 06/26/23 1725  BP:  (!) 114/58 (!) 109/49 133/76  Pulse:  77 76 80  Resp:      Temp:  98.3 F (36.8 C)    TempSrc:  Oral    SpO2:  99% 100% 99%  Weight: (!) 158.3 kg     Height:        Intake/Output Summary (Last 24 hours) at 06/26/2023 1832 Last data filed at 06/26/2023 1821 Gross per 24 hour  Intake 720 ml  Output 3328 ml  Net -2608 ml   Filed Weights   06/25/23 1127 06/25/23 1546 06/26/23 0500  Weight: (!) 157.9 kg (!) 157.5 kg (!) 158.3 kg    Physical Exam  Gen:- Awake Alert, no acute distress HEENT:- [place].AT, No sclera icterus Neck-Supple Neck,No JVD,.  Lungs-[place] movement, no wheezing CV- S1, S2 normal, regular  Abd-  +ve B.Sounds, Abd Soft, No tenderness,    Extremity/Skin:- +ve edema, pedal pulses present  Psych-affect is appropriate, oriented x3 Neuro-no new focal deficits, no tremors  Data Reviewed: I have personally reviewed following labs and imaging studies  CBC: Recent Labs  Lab 06/25/23 1206 06/26/23 0410  WBC 2.7* 2.4*  NEUTROABS 1.7  --   HGB 8.4* 7.3*  HCT 26.5* 22.8*  MCV 89.5 88.0  PLT 66* 56*   Basic Metabolic Panel: Recent Labs  Lab 06/25/23 1206 06/26/23 0410  NA 133* 134*  K 3.4* 3.4*  CL 98 100  CO2 25 26  GLUCOSE 168* 121*  BUN 27* 26*  CREATININE 2.13* 2.15*  CALCIUM 8.8* 8.2*   GFR: Estimated Creatinine Clearance: 54.6 mL/min (A) (by C-G formula based on SCr of 2.15 mg/dL (H)). Liver Function Tests: Recent Labs  Lab 06/25/23 1206 06/26/23 0410  AST 49* 38  ALT 19 16  ALKPHOS 105 93   BILITOT 5.1* 3.3*  PROT 6.7 5.6*  ALBUMIN 2.9* 2.5*   Recent Results (from the past 240 hours)  Resp panel by RT-PCR (RSV, Flu A&B, Covid) Anterior Nasal Swab     Status: None   Collection Time: 06/25/23 11:52 AM   Specimen: Anterior Nasal Swab  Result Value Ref Range Status   SARS Coronavirus 2 by RT PCR NEGATIVE NEGATIVE Final    Comment: (NOTE) SARS-CoV-2 target nucleic acids are NOT DETECTED.  The SARS-CoV-2 RNA is generally detectable in upper respiratory specimens during  the acute phase of infection. The lowest concentration of SARS-CoV-2 viral copies this assay can detect is 138 copies/mL. A negative result does not preclude SARS-Cov-2 infection and should not be used as the sole basis for treatment or other patient management decisions. A negative result [place] occur with  improper specimen collection/handling, submission of specimen other than nasopharyngeal swab, presence of viral mutation(s) within the areas targeted by this assay, and inadequate number of viral copies(<138 copies/mL). A negative result must be combined with clinical observations, patient history, and epidemiological information. The expected result is Negative.  Fact Sheet for Patients:  BloggerCourse.com  Fact Sheet for Healthcare Providers:  SeriousBroker.it  This test is no t yet approved or cleared by the [place] FDA and  has been authorized for detection and/or diagnosis of SARS-CoV-2 by FDA under an Emergency Use Authorization (EUA). This EUA will remain  in effect (meaning this test can be used) for the duration of the COVID-19 declaration under Section 564(b)(1) of the Act, 21 U.S.C.section 360bbb-3(b)(1), unless the authorization is terminated  or revoked sooner.       Influenza A by PCR NEGATIVE NEGATIVE Final   Influenza B by PCR NEGATIVE NEGATIVE Final    Comment: (NOTE) The Xpert Xpress SARS-CoV-2/FLU/RSV plus assay is intended  as an aid in the diagnosis of influenza from Nasopharyngeal swab specimens and should not be used as a sole basis for treatment. Nasal washings and aspirates are unacceptable for Xpert Xpress SARS-CoV-2/FLU/RSV testing.  Fact Sheet for Patients: BloggerCourse.com  Fact Sheet for Healthcare Providers: SeriousBroker.it  This test is not yet approved or cleared by the [place] FDA and has been authorized for detection and/or diagnosis of SARS-CoV-2 by FDA under an Emergency Use Authorization (EUA). This EUA will remain in effect (meaning this test can be used) for the duration of the COVID-19 declaration under Section 564(b)(1) of the Act, 21 U.S.C. section 360bbb-3(b)(1), unless the authorization is terminated or revoked.     Resp Syncytial Virus by PCR NEGATIVE NEGATIVE Final    Comment: (NOTE) Fact Sheet for Patients: BloggerCourse.com  Fact Sheet for Healthcare Providers: SeriousBroker.it  This test is not yet approved or cleared by the [place] FDA and has been authorized for detection and/or diagnosis of SARS-CoV-2 by FDA under an Emergency Use Authorization (EUA). This EUA will remain in effect (meaning this test can be used) for the duration of the COVID-19 declaration under Section 564(b)(1) of the Act, 21 U.S.C. section 360bbb-3(b)(1), unless the authorization is terminated or revoked.  Performed at [facility], [address], [place], [place] [address]     Radiology Studies: [facility] Chest [facility] 1 View Result Date: 06/25/2023 CLINICAL DATA:  Shortness of breath and bilateral lower extremity edema EXAM: PORTABLE CHEST 1 VIEW COMPARISON:  Chest radiograph dated 05/28/2023 FINDINGS: Normal lung volumes. No focal consolidations. No pleural effusion or pneumothorax. Similar enlarged cardiomediastinal silhouette. No acute osseous abnormality. IMPRESSION: 1.  No focal  consolidations. 2. Similar cardiomegaly. Electronically Signed   By: Agustin Cree M.D.   On: 06/25/2023 13:30   Scheduled Meds:  dapagliflozin propanediol  10 mg Oral QAC breakfast   furosemide  80 mg Intravenous BID   insulin aspart  0-5 Units Subcutaneous QHS   insulin aspart  0-9 Units Subcutaneous TID WC   lactulose  30 g Oral TID   levothyroxine  175 mcg Oral Q0600   midodrine  5 mg Oral TID WC   pantoprazole  40 mg Oral Daily   rifaximin  550 mg Oral BID   sertraline  100 mg Oral Daily   sodium chloride flush  3 mL Intravenous Q12H   spironolactone  100 mg Oral Daily   tamsulosin  0.4 mg Oral QPC supper   traZODone  50 mg Oral QHS   Continuous Infusions:   LOS: 1 day   Shon Hale M.D on 06/26/2023 at 6:32 PM  Go to [email or web address] - for contact info  [facility] - Office  [phone]  If 7PM-7AM, please contact night-coverage [email or web address] 06/26/2023, 6:32 PM

## 2023-06-26 NOTE — Progress Notes (Signed)
 Nurse at bedside this am,patient c/o being nauseated .Capillary blood glucose collected this was 110,patient tolerated ,procedure.Plan of care on going.

## 2023-06-26 NOTE — Progress Notes (Signed)
 Nurse at bedside,alert and oriented times four.Patient's blood pressure 109/49.heart rate 76,Dr Courage notified.Patient received Compazine 10 mg's intravenous per [facility] per MD's order. Plan of care on going.

## 2023-06-26 NOTE — TOC Initial Note (Signed)
 Transition of Care [facility]) - Initial/Assessment Note    Patient Details  Name: Daniel Crawford MRN: [id number] Date of Birth: Sep 26, 1955  Transition of Care [facility]) CM/SW Contact:    Karn Cassis, LCSW Phone Number: 06/26/2023, 2:49 PM  Clinical Narrative: Assessment completed due to high risk readmission score. Pt reports he lives alone and is fairly independent with ADLs. He has a cane, walker, and lift chair at home. Pt is active with [facility] HHPT/OT. He was recently at [facility] but has been home with home health for the past few weeks. Pt feels he is managing okay. He requests to return home with continued home health services. Sarah with [facility] notified of admission. PT pending. TOC will follow.                   Expected Discharge Plan: Home w Home Health Services Barriers to Discharge: Continued Medical Work up   Patient Goals and CMS Choice Patient states their goals for this hospitalization and ongoing recovery are:: return home   Choice offered to / list presented to : Patient [place] ownership interest in [facility].provided to::  (n/a)    Expected Discharge Plan and Services In-house Referral: Clinical Social Work   Post Acute Care Choice: Home Health Living arrangements for the past 2 months: Apartment                           HH Arranged: PT, OT HH Agency: Other - See comment Producer, television/film/video) Date HH Agency Contacted: 06/26/23 Time HH Agency Contacted: 1447 Representative spoke with at [facility] Agency: Sarah  Prior Living Arrangements/Services Living arrangements for the past 2 months: Apartment Lives with:: Self Patient language and need for interpreter reviewed:: Yes Do you feel safe going back to the place where you live?: Yes      Need for Family Participation in Patient Care: No (Comment)   Current home services: DME, Home PT, Home OT (cane, walker, lift chair) Criminal Activity/Legal Involvement Pertinent to Current Situation/Hospitalization: No -  Comment as needed  Activities of Daily Living   ADL Screening (condition at time of admission) Independently performs ADLs?: Yes (appropriate for developmental age) Is the patient deaf or have difficulty hearing?: No Does the patient have difficulty seeing, even when wearing glasses/contacts?: No Does the patient have difficulty concentrating, remembering, or making decisions?: No  Permission Sought/Granted                  Emotional Assessment     Affect (typically observed): Appropriate Orientation: : Oriented to Self, Oriented to Place, Oriented to Situation, Oriented to  Time Alcohol / Substance Use: Not Applicable Psych Involvement: No (comment)  Admission diagnosis:  Hepatic encephalopathy (HCC) [K76.82] Bilateral lower extremity edema [R60.0] Anemia, unspecified type [D64.9] Chronic kidney disease, unspecified CKD stage [N18.9] Chronic congestive heart failure, unspecified heart failure type (HCC) [I50.9] Acute on chronic heart failure with preserved ejection fraction (HFpEF) (HCC) [I50.33] Patient Active Problem List   Diagnosis Date Noted   Acute on chronic heart failure with preserved ejection fraction (HFpEF) (HCC) 06/25/2023   Elevated TSH 05/13/2023   Lumbar back pain 04/25/2023   Orthostatic hypotension 03/25/2023   Dehydration 03/25/2023   Lactic acidosis 03/25/2023   Hip pain 03/25/2023   Type 2 diabetes mellitus with hyperglycemia (HCC) 03/25/2023   Depression 03/25/2023   (HFpEF) heart failure with preserved ejection fraction (HCC) 03/25/2023   Obesity, Class III, BMI 40-49.9 (morbid obesity) (HCC)  03/11/2023   Hepatic encephalopathy (HCC) 02/01/2023   Acute kidney injury superimposed on stage 3b chronic kidney disease (HCC) 01/27/2023   COPD (chronic obstructive pulmonary disease) (HCC) 01/21/2023   Acute on chronic diastolic (congestive) heart failure (HCC) 01/21/2023   Cellulitis and abscess of left leg 10/23/2022   Chronic hyponatremia  10/21/2022   CKD (chronic kidney disease) stage 3, GFR 30-59 ml/min (HCC) 07/06/2022   Umbilical hernia 07/05/2022   Obesity, class 3 05/25/2022   Hepatic cirrhosis (HCC) 03/13/2022   Iron deficiency anemia 02/28/2022   GERD (gastroesophageal reflux disease) 02/28/2022   Generalized weakness 01/23/2022   Essential hypertension 01/23/2022   Volume overload 01/23/2022   Protein calorie malnutrition (HCC) 01/22/2022   Chronic diastolic heart failure (HCC)    Hypogonadism, male 12/26/2021   History of colonic polyps 07/27/2021   Auditory hallucinations 07/27/2021   Prolonged QT interval 04/19/2021   Hypokalemia 04/18/2021   Cellulitis of left leg 03/26/2021   OSA (obstructive sleep apnea) 01/20/2020   History of ascites 10/22/2019   Acquired hypothyroidism 10/16/2016   Depression with anxiety 10/16/2016   Thrombocytopenia (HCC) 10/16/2016   Spleen enlarged 10/16/2016   DM (diabetes mellitus) (HCC) 02/28/2016   Class 3 severe obesity in adult [facility]) 02/28/2016   PCP:  Elmer Picker [facility]:   [id number] [place], [place] - [address] [facility] [address] [place] [address] Phone: [phone] Fax: [phone]     Social Drivers of Health (SDOH) Social History: SDOH Screenings   Food Insecurity: No Food Insecurity (06/25/2023)  Housing: Low Risk  (06/25/2023)  Transportation Needs: No Transportation Needs (06/25/2023)  Utilities: Not At Risk (06/25/2023)  Depression (PHQ2-9): Medium Risk (10/27/2019)  Financial Resource Strain: Low Risk  (03/28/2021)   Received from [facility]  Social Connections: Moderately Integrated (06/25/2023)  Tobacco Use: Medium Risk (06/25/2023)   SDOH Interventions:     Readmission Risk Interventions    06/26/2023    2:41 PM 03/26/2023   11:03 AM 02/04/2023    9:32 AM  Readmission Risk Prevention Plan  Transportation Screening Complete Complete Complete  Medication  Review Oceanographer) Complete Complete Complete  HRI or Home Care Consult Complete Complete Complete  SW Recovery Care/Counseling Consult Complete Complete Complete  Palliative Care Screening Not Applicable Not Applicable Not Applicable  Skilled Nursing Facility Not Applicable Not Applicable Not Applicable

## 2023-06-26 NOTE — Plan of Care (Signed)

## 2023-06-26 NOTE — Plan of Care (Signed)
   Problem: Education: Goal: Knowledge of General Education information will improve Description Including pain rating scale, medication(s)/side effects and non-pharmacologic comfort measures Outcome: Progressing   Problem: Education: Goal: Knowledge of General Education information will improve Description Including pain rating scale, medication(s)/side effects and non-pharmacologic comfort measures Outcome: Progressing

## 2023-06-27 DIAGNOSIS — I5033 Acute on chronic diastolic (congestive) heart failure: Secondary | ICD-10-CM | POA: Diagnosis not present

## 2023-06-27 LAB — RENAL FUNCTION PANEL
Albumin: 2.7 g/dL — ABNORMAL LOW (ref 3.5–5.0)
Anion gap: 9 (ref 5–15)
BUN: 25 mg/dL — ABNORMAL HIGH (ref 8–23)
CO2: 26 mmol/L (ref 22–32)
Calcium: 7.9 mg/dL — ABNORMAL LOW (ref 8.9–10.3)
Chloride: 99 mmol/L (ref 98–111)
Creatinine, Ser: 2.17 mg/dL — ABNORMAL HIGH (ref 0.61–1.24)
GFR, Estimated: 33 mL/min — ABNORMAL LOW (ref >60)
Glucose, Bld: 116 mg/dL — ABNORMAL HIGH (ref 70–99)
Phosphorus: 3.3 mg/dL (ref 2.5–4.6)
Potassium: 3.8 mmol/L (ref 3.5–5.1)
Sodium: 134 mmol/L — ABNORMAL LOW (ref 135–145)

## 2023-06-27 LAB — GLUCOSE, CAPILLARY
Glucose-Capillary: 110 mg/dL — ABNORMAL HIGH (ref 70–99)
Glucose-Capillary: 121 mg/dL — ABNORMAL HIGH (ref 70–99)
Glucose-Capillary: 108 mg/dL — ABNORMAL HIGH (ref 70–99)
Glucose-Capillary: 121 mg/dL — ABNORMAL HIGH (ref 70–99)

## 2023-06-27 LAB — CBC
HCT: 24.4 % — ABNORMAL LOW (ref 39.0–52.0)
Hemoglobin: 7.7 g/dL — ABNORMAL LOW (ref 13.0–17.0)
MCH: 28 pg (ref 26.0–34.0)
MCHC: 31.6 g/dL (ref 30.0–36.0)
MCV: 88.7 fL (ref 80.0–100.0)
Platelets: 58 10*3/uL — ABNORMAL LOW (ref 150–400)
RBC: 2.75 MIL/uL — ABNORMAL LOW (ref 4.22–5.81)
RDW: 17.6 % — ABNORMAL HIGH (ref 11.5–15.5)
WBC: 2.8 10*3/uL — ABNORMAL LOW (ref 4.0–10.5)
nRBC: 0 % (ref 0.0–0.2)

## 2023-06-27 NOTE — Evaluation (Signed)
 Physical Therapy Evaluation Patient Details Name: Daniel Crawford MRN: [id number] DOB: 11/03/1955 Today's Date: 06/27/2023  History of Present Illness  Daniel Crawford is a 68 y.o. male with a history of NASH cirrhosis with splenomegaly, thrombocytopenia, admission for hepatic encephalopathy last month, asthma, anxiety, HFpEF, stage IIIa CKD, T2DM, HTN, hypothyroidism who presented to the ED today for leg swelling. Despite taking his home diuretics in addition to other medications, he reports one week of gradual increase in girth of his legs extending into abdomen. Weighed 348lbs at home. He has chronic orthopnea that is no different, no dyspnea or chest pain or palpitations. His cognition has been waxing/waning over this past weekend but he's clear headed today. He's had no significant new bleeding, but still bruises easily, no abd pain, N/V. Has normal stool output on lactulose, but didn't take today to avoid loose stool in ED.  Clinical Impression  PT states HH just stopped services and has given him HEP>  Pt demonstrates mod I in all mobility.  Raising LE into bed is difficult put pt is able to complete.  PT does not need skilled PT at this time.         If plan is discharge home, recommend the following: Help with stairs or ramp for entrance;Assist for transportation   Can travel by private vehicle    yes    Equipment Recommendations None recommended by PT  Recommendations for Other Services    nont   Functional Status Assessment Patient has had a recent decline in their functional status and demonstrates the ability to make significant improvements in function in a reasonable and predictable amount of time.     Precautions / Restrictions Precautions Precautions: None Restrictions Weight Bearing Restrictions Per Provider Order: No      Mobility  Bed Mobility Overal bed mobility: Modified Independent                  Transfers Overall transfer level: Modified independent Equipment  used: Quad cane                    Ambulation/Gait Ambulation/Gait assistance: Modified independent (Device/Increase time) Gait Distance (Feet): 200 Feet Assistive device: Quad cane Gait Pattern/deviations: Step-through pattern   Gait velocity interpretation: 1.31 - 2.62 ft/sec, indicative of limited community ambulator           Pertinent Vitals/Pain Pain Assessment Pain Assessment: No/denies pain    Home Living Family/patient expects to be discharged to:: Private residence Living Arrangements: Alone Available Help at Discharge: Friend(s);Available PRN/intermittently Type of Home: Apartment Home Access: Level entry       Home Layout: One level Home Equipment: Rolling Walker (2 wheels);Grab bars - tub/shower;Cane - quad;Shower Diplomatic Services operational officer (4 wheels) Additional Comments: pt reports he has life alert as well; reports no change in living history    Prior Function Prior Level of Function : Needs assist       Physical Assist : ADLs (physical)   ADLs (physical): IADLs Mobility Comments: quad cane houshold ambulator; rollator in community ADLs Comments: Indpendent; assist for getting groceries; pt no longer drives.     Extremity/Trunk Assessment        Lower Extremity Assessment Lower Extremity Assessment: Overall WFL for tasks assessed       Communication   Communication Communication: No apparent difficulties    Cognition Arousal: Alert Behavior During Therapy: WFL for tasks assessed/performed  Following commands: Intact       Cueing Cueing Techniques: Verbal cues            Assessment/Plan    PT Assessment Patient does not need any further PT services         PT Goals (Current goals can be found in the Care Plan section)  Acute Rehab PT Goals Patient Stated Goal: to go home PT Goal Formulation: With patient Time For Goal Achievement: 06/29/23 Potential to Achieve Goals: Good             AM-PAC PT "6 Clicks" Mobility  Outcome Measure Help needed turning from your back to your side while in a flat bed without using bedrails?: None Help needed moving from lying on your back to sitting on the side of a flat bed without using bedrails?: None Help needed moving to and from a bed to a chair (including a wheelchair)?: None Help needed standing up from a chair using your arms (e.g., wheelchair or bedside chair)?: None Help needed to walk in hospital room?: None Help needed climbing 3-5 steps with a railing? : None 6 Click Score: 24    End of Session Equipment Utilized During Treatment: Gait belt Activity Tolerance: Other (comment) (Slightly SOB following Ambulation) Patient left: in bed Nurse Communication: Mobility status      Time: 8657-8469 PT Time Calculation (min) (ACUTE ONLY): 23 min   Charges:   PT Evaluation $PT Eval Low Complexity: 1 Low   PT General Charges $$ ACUTE PT VISIT: 1 Visit       Virgina Organ, PT CLT [phone]  06/27/2023, 2:08 PM

## 2023-06-27 NOTE — Progress Notes (Signed)
 Mobility Specialist Progress Note:    06/27/23 1031  Mobility  Activity Refused mobility   Pt politely refused mobility d/t already being up today. All needs met.  Lawerance Bach Mobility Specialist Please contact via Special educational needs teacher or  Rehab office at [phone]

## 2023-06-27 NOTE — Progress Notes (Addendum)
 PROGRESS NOTE  Daniel Crawford, is a 68 y.o. male, DOB - [birth year], BJY:[id number]  Admit date - 06/25/2023   Admitting Physician Tyrone Nine, MD  Outpatient Primary MD for the patient is [facility]  LOS - 2  Chief Complaint  Patient presents with   Leg Swelling      Brief Narrative:   68 y.o. male with a history of NASH cirrhosis with splenomegaly, thrombocytopenia, admission for hepatic encephalopathy last month, asthma, anxiety, HFpEF, stage IIIa CKD, T2DM, HTN, hypothyroidism admitted with volume overload/acute on chronic diastolic dysfunction CHF with significant right-sided heart failure symptoms including anasarca    -Assessment and Plan: 1)acute on chronic HFpEF/acute on chronic diastolic dysfunction CHF with significant right-sided heart failure symptoms and anasarca - Right heart catheterization by Dr. Shirlee Latch Nov 2024 showed high cardiac output, normal PA pressure, low PAPi suggesting primary RV failure. - Augment diuretic to lasix 80mg  IV planning BID (received 20mg  in ED).   06/27/23 -Received albumin with Lasix Patient remains volume overloaded -Continue with iv Lasix diuresis - Support BP with midodrine   2)NASH cirrhosis: Reportedly not a liver transplant candidate per GI note 04/25/2023.  -06/27/23 -Hold PTA torsemide while on IV Lasix here -continue spironolactone and lactulose/rifaximin as above, low sodium diet - Follow up with [facility] after discharge - Limit TDD tylenol < 3 g.  3)History of hepatic encephalopathy: Ammonia slightly elevated but below prior level and mentation is at baseline.  - Continue home lactulose, rifaximin     4)Pancytopenia: Near baseline, no bleeding or fever. Given marginal plt count, will defer pharmacologic VTE prophylaxis in favor of frequent ambulation.  - Up with assistance, anticipate frequent trips to bathroom due to IV diuresis and lactulose.    Stage IIIa CKD:  - Monitor BMP with diuresis, avoid  nephrotoxins as able.    T2DM: Last HbA1c 5.2% reflecting excellent diabetic control PTA - Continue farxiga - Will cover with sensitive SSI, glucose 168 on arrival.    Hypothyroidism: TSH last admission was 6.781, though that was much improved from prior and free T4 was normal.  - Continue synthroid, suggest recheck TFTs after convalescence.   GERD:  - Continue PPI, last EGD showed portal hypertensive gastropathy without varices mentioned.    Anxiety:  - Continue SSRI, qHS trazodone.   BPH:  - Continue tamsulosin   Asthma: Quiescent.  - Continue prn albuterol    Generalized weakness and deconditioning--physical therapy evaluation requested and pending   Status is: Inpatient   Disposition: The patient is from: Home              Anticipated d/c is to: Home with [facility]              Anticipated d/c date is: 1 day              Patient currently is not medically stable to d/c. Barriers: Not Clinically Stable-   Code Status :  -  Code Status: Limited: Do not attempt resuscitation (DNR) -DNR-LIMITED -Do Not Intubate/DNI    Family Communication:    NA (patient is alert, awake and coherent)   DVT Prophylaxis  :   - SCDs SCDs Start: 06/25/23 1628   Lab Results  Component Value Date   PLT 58 (L) 06/27/2023    Inpatient Medications  Scheduled Meds:  dapagliflozin propanediol  10 mg Oral QAC breakfast   furosemide  80 mg Intravenous BID   insulin aspart  0-5 Units Subcutaneous QHS  insulin aspart  0-9 Units Subcutaneous TID WC   lactulose  30 g Oral TID   levothyroxine  175 mcg Oral Q0600   midodrine  5 mg Oral TID WC   pantoprazole  40 mg Oral Daily   rifaximin  550 mg Oral BID   sertraline  100 mg Oral Daily   sodium chloride flush  3 mL Intravenous Q12H   spironolactone  100 mg Oral Daily   tamsulosin  0.4 mg Oral QPC supper   traZODone  50 mg Oral QHS   Continuous Infusions: PRN Meds:.acetaminophen **OR** acetaminophen, albuterol, hydrOXYzine, mouth rinse,  prochlorperazine   Anti-infectives (From admission, onward)    Start     Dose/Rate Route Frequency Ordered Stop   06/25/23 1800  rifaximin (XIFAXAN) tablet 550 mg        550 mg Oral 2 times daily 06/25/23 1633           Subjective: Drae Peto today has no fevers, no emesis,  No chest pain,   - Voiding okay -Had  mushy BMs -Remains volume overloaded--- needs additional IV diuresis with Lasix   Objective: Vitals:   06/26/23 2009 06/27/23 0458 06/27/23 0500 06/27/23 1321  BP:  116/63  116/64  Pulse:    86  Resp: 16 20  18   Temp:  98.4 F (36.9 C)  98.1 F (36.7 C)  TempSrc:  Oral  Oral  SpO2:  94%  100%  Weight:   (!) 156.5 kg   Height:        Intake/Output Summary (Last 24 hours) at 06/27/2023 1901 Last data filed at 06/27/2023 1800 Gross per 24 hour  Intake 480 ml  Output 3500 ml  Net -3020 ml   Filed Weights   06/25/23 1546 06/26/23 0500 06/27/23 0500  Weight: (!) 157.5 kg (!) 158.3 kg (!) 156.5 kg    Physical Exam  Gen:- Awake Alert, no acute distress HEENT:- [place].AT, No sclera icterus Neck-Supple Neck,No JVD,.  Lungs-[place] movement, no wheezing CV- S1, S2 normal, regular  Abd-  +ve B.Sounds, Abd Soft, No tenderness,    Extremity/Skin:-Improving edema, pedal pulses present  Psych-affect is appropriate, oriented x3 Neuro-no new focal deficits, no tremors  Data Reviewed: I have personally reviewed following labs and imaging studies  CBC: Recent Labs  Lab 06/25/23 1206 06/26/23 0410 06/27/23 0448  WBC 2.7* 2.4* 2.8*  NEUTROABS 1.7  --   --   HGB 8.4* 7.3* 7.7*  HCT 26.5* 22.8* 24.4*  MCV 89.5 88.0 88.7  PLT 66* 56* 58*   Basic Metabolic Panel: Recent Labs  Lab 06/25/23 1206 06/26/23 0410 06/27/23 0448  NA 133* 134* 134*  K 3.4* 3.4* 3.8  CL 98 100 99  CO2 25 26 26   GLUCOSE 168* 121* 116*  BUN 27* 26* 25*  CREATININE 2.13* 2.15* 2.17*  CALCIUM 8.8* 8.2* 7.9*  PHOS  --   --  3.3   GFR: Estimated Creatinine Clearance: 53.7 mL/min (A) (by  C-G formula based on SCr of 2.17 mg/dL (H)). Liver Function Tests: Recent Labs  Lab 06/25/23 1206 06/26/23 0410 06/27/23 0448  AST 49* 38  --   ALT 19 16  --   ALKPHOS 105 93  --   BILITOT 5.1* 3.3*  --   PROT 6.7 5.6*  --   ALBUMIN 2.9* 2.5* 2.7*   Recent Results (from the past 240 hours)  Resp panel by RT-PCR (RSV, Flu A&B, Covid) Anterior Nasal Swab     Status: None  Collection Time: 06/25/23 11:52 AM   Specimen: Anterior Nasal Swab  Result Value Ref Range Status   SARS Coronavirus 2 by RT PCR NEGATIVE NEGATIVE Final    Comment: (NOTE) SARS-CoV-2 target nucleic acids are NOT DETECTED.  The SARS-CoV-2 RNA is generally detectable in upper respiratory specimens during the acute phase of infection. The lowest concentration of SARS-CoV-2 viral copies this assay can detect is 138 copies/mL. A negative result does not preclude SARS-Cov-2 infection and should not be used as the sole basis for treatment or other patient management decisions. A negative result Ceja occur with  improper specimen collection/handling, submission of specimen other than nasopharyngeal swab, presence of viral mutation(s) within the areas targeted by this assay, and inadequate number of viral copies(<138 copies/mL). A negative result must be combined with clinical observations, patient history, and epidemiological information. The expected result is Negative.  Fact Sheet for Patients:  BloggerCourse.com  Fact Sheet for Healthcare Providers:  SeriousBroker.it  This test is no t yet approved or cleared by the [place] FDA and  has been authorized for detection and/or diagnosis of SARS-CoV-2 by FDA under an Emergency Use Authorization (EUA). This EUA will remain  in effect (meaning this test can be used) for the duration of the COVID-19 declaration under Section 564(b)(1) of the Act, 21 U.S.C.section 360bbb-3(b)(1), unless the authorization is  terminated  or revoked sooner.       Influenza A by PCR NEGATIVE NEGATIVE Final   Influenza B by PCR NEGATIVE NEGATIVE Final    Comment: (NOTE) The Xpert Xpress SARS-CoV-2/FLU/RSV plus assay is intended as an aid in the diagnosis of influenza from Nasopharyngeal swab specimens and should not be used as a sole basis for treatment. Nasal washings and aspirates are unacceptable for Xpert Xpress SARS-CoV-2/FLU/RSV testing.  Fact Sheet for Patients: BloggerCourse.com  Fact Sheet for Healthcare Providers: SeriousBroker.it  This test is not yet approved or cleared by the [place] FDA and has been authorized for detection and/or diagnosis of SARS-CoV-2 by FDA under an Emergency Use Authorization (EUA). This EUA will remain in effect (meaning this test can be used) for the duration of the COVID-19 declaration under Section 564(b)(1) of the Act, 21 U.S.C. section 360bbb-3(b)(1), unless the authorization is terminated or revoked.     Resp Syncytial Virus by PCR NEGATIVE NEGATIVE Final    Comment: (NOTE) Fact Sheet for Patients: BloggerCourse.com  Fact Sheet for Healthcare Providers: SeriousBroker.it  This test is not yet approved or cleared by the [place] FDA and has been authorized for detection and/or diagnosis of SARS-CoV-2 by FDA under an Emergency Use Authorization (EUA). This EUA will remain in effect (meaning this test can be used) for the duration of the COVID-19 declaration under Section 564(b)(1) of the Act, 21 U.S.C. section 360bbb-3(b)(1), unless the authorization is terminated or revoked.  Performed at [facility], [address] [place], [place] [address]     Radiology Studies: No results found.  Scheduled Meds:  dapagliflozin propanediol  10 mg Oral QAC breakfast   furosemide  80 mg Intravenous BID   insulin aspart  0-5 Units Subcutaneous QHS    insulin aspart  0-9 Units Subcutaneous TID WC   lactulose  30 g Oral TID   levothyroxine  175 mcg Oral Q0600   midodrine  5 mg Oral TID WC   pantoprazole  40 mg Oral Daily   rifaximin  550 mg Oral BID   sertraline  100 mg Oral Daily   sodium chloride flush  3 mL Intravenous Q12H   spironolactone  100 mg Oral Daily   tamsulosin  0.4 mg Oral QPC supper   traZODone  50 mg Oral QHS   Continuous Infusions:   LOS: 2 days   Shon Hale M.D on 06/27/2023 at 7:01 PM  Go to [email or web address] - for contact info  [facility] - Office  [phone]  If 7PM-7AM, please contact night-coverage [email or web address] 06/27/2023, 7:01 PM

## 2023-06-27 NOTE — Evaluation (Signed)
 Occupational Therapy Evaluation Patient Details Name: Daniel Crawford MRN: [id number] DOB: 1955/04/30 Today's Date: 06/27/2023   History of Present Illness   Daniel Crawford is a 68 y.o. male with a history of NASH cirrhosis with splenomegaly, thrombocytopenia, admission for hepatic encephalopathy last month, asthma, anxiety, HFpEF, stage IIIa CKD, T2DM, HTN, hypothyroidism who presented to the ED today for leg swelling. Despite taking his home diuretics in addition to other medications, he reports one week of gradual increase in girth of his legs extending into abdomen. Weighed 348lbs at home. He has chronic orthopnea that is no different, no dyspnea or chest pain or palpitations. His cognition has been waxing/waning over this past weekend but he's clear headed today. He's had no significant new bleeding, but still bruises easily, no abd pain, N/V. Has normal stool output on lactulose, but didn't take today to avoid loose stool in ED. (per MD)     Clinical Impressions Pt agreeable to OT evaluation. Pt was able to transfer to chair and toilet with mod I level of assist using quad cane. Pt does have history of falling. Pt was assisted to don socks but reportedly uses a sock aid at baseline. WFL B UE strength. Pt was left in the chair with the chair alarm set due to history of falls. Pt is not recommended for further acute OT services and will be discharged to care of nursing staff for remaining length of stay.      If plan is discharge home, recommend the following:   Assist for transportation     Functional Status Assessment   Patient has not had a recent decline in their functional status     Equipment Recommendations   None recommended by OT             Precautions/Restrictions   Precautions Precautions: Fall Recall of Precautions/Restrictions: Intact Restrictions Weight Bearing Restrictions Per Provider Order: No     Mobility Bed Mobility Overal bed mobility: Modified  Independent                  Transfers Overall transfer level: Modified independent                 General transfer comment: EOB to chair with quad cane.      Balance Overall balance assessment: Mild deficits observed, not formally tested                                         ADL either performed or assessed with clinical judgement   ADL Overall ADL's : Modified independent                                       General ADL Comments: Pt uses sock aid at baseline. assist today since no sock aid in the room.     Vision Baseline Vision/History: 1 Wears glasses;4 Cataracts Ability to See in Adequate Light: 1 Impaired Patient Visual Report: No change from baseline Vision Assessment?: No apparent visual deficits     Perception Perception: Not tested       Praxis Praxis: Not tested       Pertinent Vitals/Pain Pain Assessment Pain Assessment: No/denies pain     Extremity/Trunk Assessment Upper Extremity Assessment Upper Extremity Assessment: Overall WFL for tasks assessed   Lower Extremity Assessment Lower  Extremity Assessment: Defer to PT evaluation   Cervical / Trunk Assessment Cervical / Trunk Assessment: Normal   Communication Communication Communication: No apparent difficulties   Cognition Arousal: Alert Behavior During Therapy: WFL for tasks assessed/performed Cognition: No apparent impairments                               Following commands: Intact       Cueing  General Comments   Cueing Techniques: Verbal cues                 Home Living Family/patient expects to be discharged to:: Private residence Living Arrangements: Alone Available Help at Discharge: Friend(s);Available PRN/intermittently Type of Home: Apartment Home Access: Level entry     Home Layout: One level     Bathroom Shower/Tub: Producer, television/film/video: Handicapped height Bathroom Accessibility:  Yes How Accessible: Accessible via walker Home Equipment: Rolling Walker (2 wheels);Grab bars - tub/shower;Cane - quad;Shower Diplomatic Services operational officer (4 wheels)   Additional Comments: pt reports he has life alert as well; reports no change in living history      Prior Functioning/Environment Prior Level of Function : Needs assist       Physical Assist : ADLs (physical)   ADLs (physical): IADLs Mobility Comments: quad cane houshold ambulator; rollator in community ADLs Comments: Indpendent; assist for getting groceries; pt no longer drives.     AM-PAC OT "6 Clicks" Daily Activity     Outcome Measure Help from another person eating meals?: None Help from another person taking care of personal grooming?: None Help from another person toileting, which includes using toliet, bedpan, or urinal?: None Help from another person bathing (including washing, rinsing, drying)?: None Help from another person to put on and taking off regular upper body clothing?: None Help from another person to put on and taking off regular lower body clothing?: None 6 Click Score: 24   End of Session Equipment Utilized During Treatment: Other (comment) (quad cane)  Activity Tolerance: Patient tolerated treatment well Patient left: in chair;with call bell/phone within reach;with chair alarm set  OT Visit Diagnosis: Unsteadiness on feet (R26.81);Other abnormalities of gait and mobility (R26.89)                Time: 1610-9604 OT Time Calculation (min): 11 min Charges:  OT General Charges $OT Visit: 1 Visit OT Evaluation $OT Eval Low Complexity: 1 Low  Cyani Kallstrom OT, MOT   Danie Chandler 06/27/2023, 9:29 AM

## 2023-06-28 DIAGNOSIS — I5033 Acute on chronic diastolic (congestive) heart failure: Secondary | ICD-10-CM | POA: Diagnosis not present

## 2023-06-28 LAB — BASIC METABOLIC PANEL WITH GFR
Anion gap: 10 (ref 5–15)
BUN: 21 mg/dL (ref 8–23)
CO2: 24 mmol/L (ref 22–32)
Calcium: 8.2 mg/dL — ABNORMAL LOW (ref 8.9–10.3)
Chloride: 98 mmol/L (ref 98–111)
Creatinine, Ser: 1.98 mg/dL — ABNORMAL HIGH (ref 0.61–1.24)
GFR, Estimated: 37 mL/min — ABNORMAL LOW (ref >60)
Glucose, Bld: 116 mg/dL — ABNORMAL HIGH (ref 70–99)
Potassium: 3.2 mmol/L — ABNORMAL LOW (ref 3.5–5.1)
Sodium: 132 mmol/L — ABNORMAL LOW (ref 135–145)

## 2023-06-28 LAB — GLUCOSE, CAPILLARY
Glucose-Capillary: 112 mg/dL — ABNORMAL HIGH (ref 70–99)
Glucose-Capillary: 132 mg/dL — ABNORMAL HIGH (ref 70–99)

## 2023-06-28 MED ORDER — TORSEMIDE 20 MG PO TABS: ORAL_TABLET | ORAL | 5 refills | Status: AC

## 2023-06-28 MED ORDER — SPIRONOLACTONE 100 MG PO TABS: 100.0000 mg | ORAL_TABLET | Freq: Every day | ORAL | 2 refills | Status: AC

## 2023-06-28 MED ORDER — ALBUTEROL SULFATE HFA 108 (90 BASE) MCG/ACT IN AERS: 2.0000 | INHALATION_SPRAY | Freq: Four times a day (QID) | RESPIRATORY_TRACT | 2 refills | Status: AC | PRN

## 2023-06-28 MED ORDER — POTASSIUM CHLORIDE CRYS ER 20 MEQ PO TBCR
40.0000 meq | EXTENDED_RELEASE_TABLET | ORAL | Status: DC
  Administered 2023-06-28: 40 meq via ORAL
  Filled 2023-06-28: qty 2

## 2023-06-28 MED ORDER — LACTULOSE 10 GM/15ML PO SOLN: 30.0000 g | Freq: Three times a day (TID) | ORAL | 3 refills | Status: AC

## 2023-06-28 MED ORDER — RIFAXIMIN 550 MG PO TABS: 550.0000 mg | ORAL_TABLET | Freq: Two times a day (BID) | ORAL | 2 refills | Status: AC

## 2023-06-28 MED ORDER — DAPAGLIFLOZIN PROPANEDIOL 10 MG PO TABS: 10.0000 mg | ORAL_TABLET | Freq: Every day | ORAL | 3 refills | Status: AC

## 2023-06-28 MED ORDER — POTASSIUM CHLORIDE CRYS ER 20 MEQ PO TBCR: 40.0000 meq | EXTENDED_RELEASE_TABLET | Freq: Every day | ORAL | 5 refills | Status: AC

## 2023-06-28 NOTE — TOC Transition Note (Signed)
 Transition of Care [facility]) - Discharge Note   Patient Details  Name: Daniel Crawford MRN: [id number] Date of Birth: 06-25-1955  [facility]) CM/SW Contact:  Villa Herb, LCSWA Phone Number: 06/28/2023, 10:12 AM   Clinical Narrative:    CSW updated that pt is medically stable for D/C home today. Pt is active with [facility]. MD updated of need for [facility] PT/OT/RN orders. Sarah with [facility] updated of plan for D/C today. TOC signing off.   Final next level of care: Home w Home Health Services Barriers to Discharge: Barriers Resolved   Patient Goals and CMS Choice Patient states their goals for this hospitalization and ongoing recovery are:: return home CMS Medicare.gov Compare Post Acute Care list provided to:: Patient Choice offered to / list presented to : Patient [place] ownership interest in [facility].provided to::  (n/a)    Discharge Placement                       Discharge Plan and Services Additional resources added to the After Visit Summary for   In-house Referral: Clinical Social Work   Post Acute Care Choice: Home Health                    HH Arranged: RN, PT, OT [facility] Agency: [facility] Date [facility] Agency Contacted: 06/28/23 Time HH Agency Contacted: [phone] Representative spoke with at [facility] Agency: Maralyn Sago  Social Drivers of Health (SDOH) Interventions SDOH Screenings   Food Insecurity: No Food Insecurity (06/25/2023)  Housing: Low Risk  (06/25/2023)  Transportation Needs: No Transportation Needs (06/25/2023)  Utilities: Not At Risk (06/25/2023)  Depression (PHQ2-9): Medium Risk (10/27/2019)  Financial Resource Strain: Low Risk  (03/28/2021)   Received from [facility]  Social Connections: Moderately Integrated (06/25/2023)  Tobacco Use: Medium Risk (06/25/2023)     Readmission Risk Interventions    06/26/2023    2:41 PM 03/26/2023   11:03 AM 02/04/2023    9:32 AM  Readmission Risk Prevention Plan  Transportation  Screening Complete Complete Complete  Medication Review Oceanographer) Complete Complete Complete  HRI or Home Care Consult Complete Complete Complete  SW Recovery Care/Counseling Consult Complete Complete Complete  Palliative Care Screening Not Applicable Not Applicable Not Applicable  Skilled Nursing Facility Not Applicable Not Applicable Not Applicable

## 2023-06-28 NOTE — Discharge Summary (Signed)
 Daniel Crawford, is a 68 y.o. male  DOB 1956-01-02  MRN [id number].  Admission date:  06/25/2023  Admitting Physician  Tyrone Nine, MD  Discharge Date:  06/28/2023   Primary MD  [facility]  Recommendations for primary care physician for things to follow:  1)Very Low-salt diet advised---Less than 2 gm of Sodium per day advised----ok to use Mrs DASH salt substitute instead of Salt 2)Weigh yourself daily, call if you gain more than 3 pounds in 1 day or more than 5 pounds in 1 week as your diuretic medications [place] need to be adjusted 3)Limit your Fluid  intake to no more than 50 ounces (1.5 Liters) per day 4) please note that there has been changes to medications 5)Please take Lactulose as prescribed----with a goal of having about 3 soft/mushy semisolid bowel movements per day, avoid constipation as this will lead to hepatic encephalopathy/confusional episodes   6)Repeat CBC and CMP blood tests in 5 to 7 days with primary care physician advised   7)Avoid ibuprofen/Advil/Aleve/Motrin/Goody Powders/Naproxen/BC powders/Meloxicam/Diclofenac/Indomethacin and other Nonsteroidal anti-inflammatory medications as these will make you more likely to bleed and can cause stomach ulcers, can also cause Kidney problems.     Admission Diagnosis  Hepatic encephalopathy (HCC) [K76.82] Bilateral lower extremity edema [R60.0] Anemia, unspecified type [D64.9] Chronic kidney disease, unspecified CKD stage [N18.9] Chronic congestive heart failure, unspecified heart failure type (HCC) [I50.9] Acute on chronic heart failure with preserved ejection fraction (HFpEF) (HCC) [I50.33]   Discharge Diagnosis  Hepatic encephalopathy (HCC) [K76.82] Bilateral lower extremity edema [R60.0] Anemia, unspecified type [D64.9] Chronic kidney disease, unspecified CKD stage [N18.9] Chronic congestive heart failure, unspecified heart  failure type (HCC) [I50.9] Acute on chronic heart failure with preserved ejection fraction (HFpEF) (HCC) [I50.33]    Principal Problem:   Acute on chronic heart failure with preserved ejection fraction (HFpEF) (HCC) Active Problems:   CKD (chronic kidney disease) stage 3, GFR 30-59 ml/min (HCC)   OSA (obstructive sleep apnea)   Hepatic cirrhosis (HCC)   Thrombocytopenia (HCC)   Acquired hypothyroidism   GERD (gastroesophageal reflux disease)   Depression with anxiety   Obesity, class 3   Volume overload   Chronic hyponatremia   Hepatic encephalopathy (HCC)   Type 2 diabetes mellitus with hyperglycemia (HCC)      Past Medical History:  Diagnosis Date   Acute pancreatitis 10/02/2022   Anasarca 01/23/2022   Anemia    Anxiety    Asthma    CHF (congestive heart failure) (HCC)    CKD (chronic kidney disease)    D-dimer, elevated 01/22/2022   Decompensation of cirrhosis of liver (HCC) 05/21/2022   Depression    Diabetes mellitus without complication (HCC)    Dyspnea    Hepatic encephalopathy (HCC) 07/10/2022   Hypertension    Hypothyroidism    Liver cirrhosis secondary to NASH (HCC)    NASH (nonalcoholic steatohepatitis)    Other ascites 06/19/2019   Pancytopenia (HCC) 05/21/2022   Pre-diabetes    Spleen enlarged    Thrombocytopenia (HCC)     Past  Surgical History:  Procedure Laterality Date   Bilateral hernia surgery     2006, 2007   CHOLECYSTECTOMY     2016    COLONOSCOPY WITH PROPOFOL N/A 06/28/2021   Procedure: COLONOSCOPY WITH PROPOFOL;  Surgeon: Malissa Hippo, MD;  [facility];  Service: Endoscopy;  Laterality: N/A;  1020   ESOPHAGEAL DILATION N/A 11/06/2019   Procedure: ESOPHAGEAL DILATION;  Surgeon: Dolores Frame, MD;  [facility]: [facility];  Service: Gastroenterology;  Laterality: N/A;   ESOPHAGOGASTRODUODENOSCOPY (EGD) WITH PROPOFOL N/A 11/06/2019   Procedure: ESOPHAGOGASTRODUODENOSCOPY (EGD) WITH PROPOFOL;  Surgeon: Dolores Frame, MD;  [facility];  Service: Gastroenterology;  Laterality: N/A;  1045   IR RADIOLOGIST EVAL & MGMT  02/19/2022   LAPAROSCOPIC ASSISTED VENTRAL HERNIA REPAIR     Polyp removed     in January of 2018.    POLYPECTOMY  06/28/2021   Procedure: POLYPECTOMY INTESTINAL;  Surgeon: Malissa Hippo, MD;  [facility]: [facility];  Service: Endoscopy;;   RIGHT HEART CATH N/A 01/08/2022   Procedure: RIGHT HEART CATH;  Surgeon: Corky Crafts, MD;  Location: [facility] INVASIVE CV LAB;  Service: Cardiovascular;  Laterality: N/A;   RIGHT HEART CATH N/A 01/29/2023   Procedure: RIGHT HEART CATH;  Surgeon: Laurey Morale, MD;  Location: [facility] INVASIVE CV LAB;  Service: Cardiovascular;  Laterality: N/A;     HPI  from the history and physical done on the day of admission:   HPI: Daniel Crawford is a 68 y.o. male with a history of NASH cirrhosis with splenomegaly, thrombocytopenia, admission for hepatic encephalopathy last month, asthma, anxiety, HFpEF, stage IIIa CKD, T2DM, HTN, hypothyroidism who presented to the ED today for leg swelling. Despite taking his home diuretics in addition to other medications, he reports one week of gradual increase in girth of his legs extending into abdomen. Weighed 348lbs at home. He has chronic orthopnea that is no different, no dyspnea or chest pain or palpitations. His cognition has been waxing/waning over this past weekend but he's clear headed today. He's had no significant new bleeding, but still bruises easily, no abd pain, N/V. Has normal stool output on lactulose, but didn't take today to avoid loose stool in ED.   Discharge summary 3/5 reviewed reported mild chronic leg edema. Currently appears to have severe pitting dependent edema on presentation. Labs relatively stable from baseline, CXR clear, admission requested for anasarca.   Review of Systems: As mentioned in the history of present illness. All other systems reviewed and are negative.   Hospital  Course:   Brief Narrative:   68 y.o. male with a history of NASH cirrhosis with splenomegaly, thrombocytopenia, admission for hepatic encephalopathy last month, asthma, anxiety, HFpEF, stage IIIa CKD, T2DM, HTN, hypothyroidism admitted with volume overload/acute on chronic diastolic dysfunction CHF with significant right-sided heart failure symptoms including anasarca     -Assessment and Plan: 1)acute on chronic HFpEF/acute on chronic diastolic dysfunction CHF with significant right-sided heart failure symptoms and anasarca - Right heart catheterization by Dr. Shirlee Latch Nov 2024 showed high cardiac output, normal PA pressure, low PAPi suggesting primary RV failure. -Received albumin with Lasix -Much improved with diuresis--IV Lasix -Okay to discharge on torsemide 80 mg every morning and 40 mg every afternoon along with Aldactone 100 mg daily -- Borgen need midodrine going forward if BP does not tolerate diuresis   2)NASH cirrhosis: Reportedly not a liver transplant candidate per GI note 04/25/2023.  -Diuretics as above #1 -Discharged  on lactulose and rifaximin as advised - Follow up with [facility] after discharge - Limit TDD tylenol < 3 g.   3)History of hepatic encephalopathy: Ammonia slightly elevated but below prior level and mentation is at baseline.  - Continue home lactulose, rifaximin  --as above #2 --Mentation appears to be at baseline at this time   4)Pancytopenia: Due to liver cirrhosis ,near baseline, no bleeding or fever.  -Avoid NSAIDs and trauma -Repeat CBC and BMP within a week as advised   Stage IIIa CKD:  -Creatinine is down to 1.98 from 2.1 with diuresis -It was 2.2 a couple weeks ago -Avoid NSAIDs -Repeat BMP with PCP within a week as advised   T2DM: Last HbA1c 5.2% reflecting excellent diabetic control PTA - Continue farxiga    Hypothyroidism: TSH last admission was 6.781, though that was much improved from prior and free T4 was normal.  - Continue synthroid,    GERD:  - Continue PPI, last EGD showed portal hypertensive gastropathy without varices mentioned.    Anxiety:  - Continue SSRI, qHS trazodone.   BPH:  - Continue tamsulosin   Asthma: Quiescent.  - Continue prn albuterol     Generalized weakness and deconditioning--physical therapy evaluation appreciated -Okay to discharge home with home health orders    Disposition: The patient is from: Home              Anticipated d/c is to: Home with [facility]  Discharge Condition: stable  Follow UP   Follow-up Information     [facility] Follow up.          [facility]. Schedule an appointment as soon as possible for a visit in 1 week(s).   Why: Repeat CBC and CMP Blood Tests Contact information: [address] [place] [address] [phone]                 Diet and Activity recommendation:  As advised  Discharge Instructions    Discharge Instructions     Call MD for:  difficulty breathing, headache or visual disturbances   Complete by: As directed    Call MD for:  persistant dizziness or light-headedness   Complete by: As directed    Call MD for:  persistant nausea and vomiting   Complete by: As directed    Call MD for:  temperature >100.4   Complete by: As directed    Diet - low sodium heart healthy   Complete by: As directed    Discharge instructions   Complete by: As directed    1)Very Low-salt diet advised---Less than 2 gm of Sodium per day advised----ok to use Mrs DASH salt substitute instead of Salt 2)Weigh yourself daily, call if you gain more than 3 pounds in 1 day or more than 5 pounds in 1 week as your diuretic medications [place] need to be adjusted 3)Limit your Fluid  intake to no more than 50 ounces (1.5 Liters) per day 4) please note that there has been changes to medications 5)Please take Lactulose as prescribed----with a goal of having about 3 soft/mushy semisolid bowel movements per day, avoid constipation as this  will lead to hepatic encephalopathy/confusional episodes   6)Repeat CBC and CMP blood tests in 5 to 7 days with primary care physician advised   7)Avoid ibuprofen/Advil/Aleve/Motrin/Goody Powders/Naproxen/BC powders/Meloxicam/Diclofenac/Indomethacin and other Nonsteroidal anti-inflammatory medications as these will make you more likely to bleed and can cause stomach ulcers, can also cause Kidney problems.   Increase activity slowly  Complete by: As directed         Discharge Medications     Allergies as of 06/28/2023   No Known Allergies      Medication List     STOP taking these medications    diclofenac Sodium 1 % Gel Commonly known as: VOLTAREN       TAKE these medications    acetaminophen 325 MG tablet Commonly known as: TYLENOL Take 2 tablets (650 mg total) by mouth every 6 (six) hours as needed for mild pain (pain score 1-3), fever or headache (or Fever >/= 101).   albuterol 108 (90 Base) MCG/ACT inhaler Commonly known as: VENTOLIN HFA Inhale 2 puffs into the lungs every 6 (six) hours as needed for wheezing or shortness of breath.   dapagliflozin propanediol 10 MG Tabs tablet Commonly known as: Farxiga Take 1 tablet (10 mg total) by mouth daily before breakfast.   hydrOXYzine 50 MG capsule Commonly known as: VISTARIL Take 1 capsule (50 mg total) by mouth 3 (three) times daily as needed for anxiety or nausea. What changed: when to take this   lactose free nutrition Liqd Take 237 mLs by mouth every evening.   lactulose 10 GM/15ML solution Commonly known as: CHRONULAC Take 45 mLs (30 g total) by mouth 3 (three) times daily.   levothyroxine 175 MCG tablet Commonly known as: SYNTHROID Take 175 mcg by mouth daily.   ondansetron 4 MG tablet Commonly known as: ZOFRAN Take 4 mg by mouth 2 (two) times daily as needed for nausea or vomiting.   pantoprazole 40 MG tablet Commonly known as: PROTONIX Take 1 tablet (40 mg total) by mouth daily. What changed:  when to take this   potassium chloride SA 20 MEQ tablet Commonly known as: KLOR-CON M Take 2 tablets (40 mEq total) by mouth daily. Take while taking Lasix What changed: additional instructions   rifaximin 550 MG Tabs tablet Commonly known as: XIFAXAN Take 1 tablet (550 mg total) by mouth 2 (two) times daily.   sertraline 100 MG tablet Commonly known as: ZOLOFT Take 100 mg by mouth daily.   spironolactone 100 MG tablet Commonly known as: ALDACTONE Take 1 tablet (100 mg total) by mouth daily.   tamsulosin 0.4 MG Caps capsule Commonly known as: FLOMAX Take 1 capsule (0.4 mg total) by mouth daily after supper.   torsemide 20 MG tablet Commonly known as: DEMADEX Take 4 tablets (80 mg total) by mouth every morning AND 2 tablets (40 mg total) every evening. What changed: See the new instructions.   traZODone 50 MG tablet Commonly known as: DESYREL Take 1 tablet (50 mg total) by mouth at bedtime. What changed:  when to take this reasons to take this   Vitamin D3 1.25 MG (50000 UT) Caps Take 1 capsule by mouth once a week. Wednesdays        Major procedures and Radiology Reports - PLEASE review detailed and final reports for all details, in brief -   DG Chest Port 1 View Result Date: 06/25/2023 CLINICAL DATA:  Shortness of breath and bilateral lower extremity edema EXAM: PORTABLE CHEST 1 VIEW COMPARISON:  Chest radiograph dated 05/28/2023 FINDINGS: Normal lung volumes. No focal consolidations. No pleural effusion or pneumothorax. Similar enlarged cardiomediastinal silhouette. No acute osseous abnormality. IMPRESSION: 1.  No focal consolidations. 2. Similar cardiomegaly. Electronically Signed   By: Agustin Cree M.D.   On: 06/25/2023 13:30   CT Head Wo Contrast Result Date: 06/11/2023 CLINICAL DATA:  Delirium. Generalized weakness, hypotension,  and dizziness. EXAM: CT HEAD WITHOUT CONTRAST TECHNIQUE: Contiguous axial images were obtained from the base of the skull through the vertex  without intravenous contrast. RADIATION DOSE REDUCTION: This exam was performed according to the departmental dose-optimization program which includes automated exposure control, adjustment of the mA and/or kV according to patient size and/or use of iterative reconstruction technique. COMPARISON:  Head CT 05/28/2023 FINDINGS: Brain: There is no evidence of an acute infarct, intracranial hemorrhage, mass, midline shift, or extra-axial fluid collection. Cerebral volume is normal. The ventricles are normal in size. Vascular: No hyperdense vessel. Skull: No fracture or suspicious lesion. Sinuses/Orbits: Mild-to-moderate right frontal and right ethmoid sinus mucosal thickening. Clear mastoid air cells. Unremarkable orbits. Other: None. IMPRESSION: Unremarkable CT appearance of the brain. Electronically Signed   By: Sebastian Ache M.D.   On: 06/11/2023 17:30    Micro Results  Recent Results (from the past 240 hours)  Resp panel by RT-PCR (RSV, Flu A&B, Covid) Anterior Nasal Swab     Status: None   Collection Time: 06/25/23 11:52 AM   Specimen: Anterior Nasal Swab  Result Value Ref Range Status   SARS Coronavirus 2 by RT PCR NEGATIVE NEGATIVE Final    Comment: (NOTE) SARS-CoV-2 target nucleic acids are NOT DETECTED.  The SARS-CoV-2 RNA is generally detectable in upper respiratory specimens during the acute phase of infection. The lowest concentration of SARS-CoV-2 viral copies this assay can detect is 138 copies/mL. A negative result does not preclude SARS-Cov-2 infection and should not be used as the sole basis for treatment or other patient management decisions. A negative result Pipe occur with  improper specimen collection/handling, submission of specimen other than nasopharyngeal swab, presence of viral mutation(s) within the areas targeted by this assay, and inadequate number of viral copies(<138 copies/mL). A negative result must be combined with clinical observations, patient history, and  epidemiological information. The expected result is Negative.  Fact Sheet for Patients:  BloggerCourse.com  Fact Sheet for Healthcare Providers:  SeriousBroker.it  This test is no t yet approved or cleared by the [place] FDA and  has been authorized for detection and/or diagnosis of SARS-CoV-2 by FDA under an Emergency Use Authorization (EUA). This EUA will remain  in effect (meaning this test can be used) for the duration of the COVID-19 declaration under Section 564(b)(1) of the Act, 21 U.S.C.section 360bbb-3(b)(1), unless the authorization is terminated  or revoked sooner.       Influenza A by PCR NEGATIVE NEGATIVE Final   Influenza B by PCR NEGATIVE NEGATIVE Final    Comment: (NOTE) The Xpert Xpress SARS-CoV-2/FLU/RSV plus assay is intended as an aid in the diagnosis of influenza from Nasopharyngeal swab specimens and should not be used as a sole basis for treatment. Nasal washings and aspirates are unacceptable for Xpert Xpress SARS-CoV-2/FLU/RSV testing.  Fact Sheet for Patients: BloggerCourse.com  Fact Sheet for Healthcare Providers: SeriousBroker.it  This test is not yet approved or cleared by the [place] FDA and has been authorized for detection and/or diagnosis of SARS-CoV-2 by FDA under an Emergency Use Authorization (EUA). This EUA will remain in effect (meaning this test can be used) for the duration of the COVID-19 declaration under Section 564(b)(1) of the Act, 21 U.S.C. section 360bbb-3(b)(1), unless the authorization is terminated or revoked.     Resp Syncytial Virus by PCR NEGATIVE NEGATIVE Final    Comment: (NOTE) Fact Sheet for Patients: BloggerCourse.com  Fact Sheet for Healthcare Providers: SeriousBroker.it  This test is not yet approved or  cleared by the [place] and has been  authorized for detection and/or diagnosis of SARS-CoV-2 by FDA under an Emergency Use Authorization (EUA). This EUA will remain in effect (meaning this test can be used) for the duration of the COVID-19 declaration under Section 564(b)(1) of the Act, 21 U.S.C. section 360bbb-3(b)(1), unless the authorization is terminated or revoked.  Performed at [facility], [address] [place], [place] [address]     Today   Subjective    Daniel Crawford today has no new complaints  No fever  Or chills   No Nausea, Vomiting or Diarrhea   Patient has been seen and examined prior to discharge   Objective   Blood pressure 127/60, pulse 91, temperature 99 F (37.2 C), temperature source Oral, resp. rate 18, height 6\' 2"  (1.88 m), weight (!) 155.9 kg, SpO2 96%.   Intake/Output Summary (Last 24 hours) at 06/28/2023 1139 Last data filed at 06/28/2023 0500 Gross per 24 hour  Intake 360 ml  Output 3550 ml  Net -3190 ml    Exam Gen:- Awake Alert, no acute distress  HEENT:- [place].AT, No sclera icterus Neck-Supple Neck,No JVD,.  Lungs-improved air movement, no wheezing or rales  CV- S1, S2 normal, regular Abd-  +ve B.Sounds, Abd Soft, No tenderness,    Extremity/Skin:-  edema,   good pulses Psych-affect is appropriate, oriented x3 Neuro-generalized weakness, no new focal deficits, no tremors    Data Review   CBC w Diff:  Lab Results  Component Value Date   WBC 2.8 (L) 06/27/2023   HGB 7.7 (L) 06/27/2023   HGB 12.3 (L) 12/27/2021   HCT 24.4 (L) 06/27/2023   HCT 36.5 (L) 12/27/2021   PLT 58 (L) 06/27/2023   PLT 63 (LL) 12/27/2021   LYMPHOPCT 20 06/25/2023   MONOPCT 6 06/25/2023   EOSPCT 10 06/25/2023   BASOPCT 1 06/25/2023    CMP:  Lab Results  Component Value Date   NA 132 (L) 06/28/2023   NA 143 12/27/2021   K 3.2 (L) 06/28/2023   CL 98 06/28/2023   CO2 24 06/28/2023   BUN 21 06/28/2023   BUN 14 12/27/2021   CREATININE 1.98 (H) 06/28/2023   CREATININE 1.40 (H) 05/17/2022    PROT 5.6 (L) 06/26/2023   ALBUMIN 2.7 (L) 06/27/2023   BILITOT 3.3 (H) 06/26/2023   ALKPHOS 93 06/26/2023   AST 38 06/26/2023   ALT 16 06/26/2023  .  Total Discharge time is about 33 minutes  Shon Hale M.D on 06/28/2023 at 11:39 AM  Go to [email or web address] -  for contact info  [facility] - Office  [phone]

## 2023-06-28 NOTE — Care Management Important Message (Signed)
 Important Message  Patient Details  Name: Daniel Crawford MRN: [id number] Date of Birth: 1955-03-31   Important Message Given:  Yes - Medicare IM     [name] 06/28/2023, 11:07 AM

## 2023-06-28 NOTE — Plan of Care (Signed)
   Problem: Education: Goal: Knowledge of General Education information will improve Description Including pain rating scale, medication(s)/side effects and non-pharmacologic comfort measures Outcome: Progressing   Problem: Health Behavior/Discharge Planning: Goal: Ability to manage health-related needs will improve Outcome: Progressing

## 2023-06-28 NOTE — Discharge Instructions (Signed)
 1)Very Low-salt diet advised---Less than 2 gm of Sodium per day advised----ok to use Mrs DASH salt substitute instead of Salt 2)Weigh yourself daily, call if you gain more than 3 pounds in 1 day or more than 5 pounds in 1 week as your diuretic medications [place] need to be adjusted 3)Limit your Fluid  intake to no more than 50 ounces (1.5 Liters) per day 4) please note that there has been changes to medications 5)Please take Lactulose as prescribed----with a goal of having about 3 soft/mushy semisolid bowel movements per day, avoid constipation as this will lead to hepatic encephalopathy/confusional episodes   6)Repeat CBC and CMP blood tests in 5 to 7 days with primary care physician advised   7)Avoid ibuprofen/Advil/Aleve/Motrin/Goody Powders/Naproxen/BC powders/Meloxicam/Diclofenac/Indomethacin and other Nonsteroidal anti-inflammatory medications as these will make you more likely to bleed and can cause stomach ulcers, can also cause Kidney problems.

## 2023-07-01 MED ORDER — LACTULOSE 10 GM/15ML PO SOLN: 30.0000 g | Freq: Three times a day (TID) | ORAL | 2 refills | Status: AC

## 2023-07-01 NOTE — Telephone Encounter (Signed)
 Completed.

## 2023-07-01 NOTE — Addendum Note (Signed)
 Addended by: Gelene Mink on: 07/01/2023 01:48 PM   Modules accepted: Orders

## 2023-07-14 ENCOUNTER — Other Ambulatory Visit: Payer: Self-pay

## 2023-07-14 ENCOUNTER — Emergency Department (HOSPITAL_COMMUNITY)
Admission: EM | Admit: 2023-07-14 | Discharge: 2023-07-14 | Disposition: A | Discharge status: Home / Self Care | Attending: Emergency Medicine | Admitting: Emergency Medicine

## 2023-07-14 ENCOUNTER — Emergency Department (HOSPITAL_COMMUNITY)

## 2023-07-14 ENCOUNTER — Encounter (HOSPITAL_COMMUNITY): Payer: Self-pay

## 2023-07-14 DIAGNOSIS — R1904 Left lower quadrant abdominal swelling, mass and lump: Secondary | ICD-10-CM | POA: Diagnosis present

## 2023-07-14 DIAGNOSIS — K7460 Unspecified cirrhosis of liver: Secondary | ICD-10-CM | POA: Insufficient documentation

## 2023-07-14 DIAGNOSIS — E1122 Type 2 diabetes mellitus with diabetic chronic kidney disease: Secondary | ICD-10-CM | POA: Insufficient documentation

## 2023-07-14 DIAGNOSIS — I130 Hypertensive heart and chronic kidney disease with heart failure and stage 1 through stage 4 chronic kidney disease, or unspecified chronic kidney disease: Secondary | ICD-10-CM | POA: Insufficient documentation

## 2023-07-14 DIAGNOSIS — N189 Chronic kidney disease, unspecified: Secondary | ICD-10-CM | POA: Diagnosis not present

## 2023-07-14 DIAGNOSIS — I509 Heart failure, unspecified: Secondary | ICD-10-CM | POA: Diagnosis not present

## 2023-07-14 DIAGNOSIS — R188 Other ascites: Secondary | ICD-10-CM | POA: Insufficient documentation

## 2023-07-14 DIAGNOSIS — D649 Anemia, unspecified: Secondary | ICD-10-CM | POA: Diagnosis not present

## 2023-07-14 DIAGNOSIS — K429 Umbilical hernia without obstruction or gangrene: Secondary | ICD-10-CM | POA: Diagnosis not present

## 2023-07-14 LAB — CBC WITH DIFFERENTIAL/PLATELET
Abs Immature Granulocytes: 0.02 10*3/uL (ref 0.00–0.07)
Basophils Absolute: 0 10*3/uL (ref 0.0–0.1)
Basophils Relative: 1 %
Eosinophils Absolute: 0.4 10*3/uL (ref 0.0–0.5)
Eosinophils Relative: 9 %
HCT: 25.8 % — ABNORMAL LOW (ref 39.0–52.0)
Hemoglobin: 8.4 g/dL — ABNORMAL LOW (ref 13.0–17.0)
Immature Granulocytes: 1 %
Lymphocytes Relative: 15 %
Lymphs Abs: 0.6 10*3/uL — ABNORMAL LOW (ref 0.7–4.0)
MCH: 28.2 pg (ref 26.0–34.0)
MCHC: 32.6 g/dL (ref 30.0–36.0)
MCV: 86.6 fL (ref 80.0–100.0)
Monocytes Absolute: 0.3 10*3/uL (ref 0.1–1.0)
Monocytes Relative: 6 %
Neutro Abs: 2.7 10*3/uL (ref 1.7–7.7)
Neutrophils Relative %: 68 %
Platelets: 51 10*3/uL — ABNORMAL LOW (ref 150–400)
RBC: 2.98 MIL/uL — ABNORMAL LOW (ref 4.22–5.81)
RDW: 16.4 % — ABNORMAL HIGH (ref 11.5–15.5)
WBC: 3.9 10*3/uL — ABNORMAL LOW (ref 4.0–10.5)
nRBC: 0 % (ref 0.0–0.2)

## 2023-07-14 LAB — COMPREHENSIVE METABOLIC PANEL WITH GFR
ALT: 18 U/L (ref 0–44)
AST: 46 U/L — ABNORMAL HIGH (ref 15–41)
Albumin: 2.9 g/dL — ABNORMAL LOW (ref 3.5–5.0)
Alkaline Phosphatase: 114 U/L (ref 38–126)
Anion gap: 10 (ref 5–15)
BUN: 40 mg/dL — ABNORMAL HIGH (ref 8–23)
CO2: 24 mmol/L (ref 22–32)
Calcium: 8.6 mg/dL — ABNORMAL LOW (ref 8.9–10.3)
Chloride: 98 mmol/L (ref 98–111)
Creatinine, Ser: 2.52 mg/dL — ABNORMAL HIGH (ref 0.61–1.24)
GFR, Estimated: 28 mL/min — ABNORMAL LOW (ref >60)
Glucose, Bld: 178 mg/dL — ABNORMAL HIGH (ref 70–99)
Potassium: 3.6 mmol/L (ref 3.5–5.1)
Sodium: 132 mmol/L — ABNORMAL LOW (ref 135–145)
Total Bilirubin: 4.1 mg/dL — ABNORMAL HIGH (ref 0.0–1.2)
Total Protein: 6.5 g/dL (ref 6.5–8.1)

## 2023-07-14 LAB — AMMONIA: Ammonia: 60 umol/L — ABNORMAL HIGH (ref 9–35)

## 2023-07-14 LAB — PROTIME-INR
INR: 1.6 — ABNORMAL HIGH (ref 0.8–1.2)
Prothrombin Time: 19.6 s — ABNORMAL HIGH (ref 11.4–15.2)

## 2023-07-14 LAB — APTT: aPTT: 38 s — ABNORMAL HIGH (ref 24–36)

## 2023-07-14 NOTE — ED Provider Notes (Signed)
 [place] [facility]GENCY DEPARTMENT AT [facility] Provider Note   CSN: [id number] Arrival date & time: 07/14/23  4098     History  No chief complaint on file.   Daniel Crawford is a 68 y.o. male with a history most pertinent for [name] induced liver cirrhosis, including thrombocytopenia, history of hepatic encephalopathy on lactulose , history of acute pancreatitis, CHF, type 2 diabetes, hypertension and chronic kidney disease presenting for evaluation of leakage of clear fluid through the skin of his left lower abdomen.  He has been compliant with his fluid restriction and diuretics and actually endorses an approximate 10 pound weight loss over the past week.  He noticed this drainage yesterday as throughout the day his clothing in this left lower region was getting wet, and when he woke this morning his sheets were entirely wet where he was laying on the side.  He does have some shortness of breath which waxes and wanes, states is typically normal for him, not worse today.  He denies chest pain, abdominal distention beyond his baseline.  No chest pain, his legs are edematous but he states they are improved this week.  HPI     Home Medications Prior to Admission medications   Medication Sig Start Date End Date Taking? Authorizing Provider  acetaminophen  (TYLENOL ) 325 MG tablet Take 2 tablets (650 mg total) by mouth every 6 (six) hours as needed for mild pain (pain score 1-3), fever or headache (or Fever >/= 101). 02/08/23   Colin Dawley, MD  albuterol  (VENTOLIN  HFA) 108 (90 Base) MCG/ACT inhaler Inhale 2 puffs into the lungs every 6 (six) hours as needed for wheezing or shortness of breath. 06/28/23   Colin Dawley, MD  Cholecalciferol (VITAMIN D3) 1.25 MG (50000 UT) CAPS Take 1 capsule by mouth once a week. Wednesdays 03/06/23   [provider]  dapagliflozin  propanediol (FARXIGA ) 10 MG TABS tablet Take 1 tablet (10 mg total) by mouth daily before breakfast. 06/28/23   Colin Dawley, MD  hydrOXYzine  (VISTARIL ) 50 MG capsule Take 1 capsule (50 mg total) by mouth 3 (three) times daily as needed for anxiety or nausea. Patient taking differently: Take 50 mg by mouth in the morning and at bedtime. 02/08/23   Colin Dawley, MD  lactose free nutrition (BOOST) LIQD Take 237 mLs by mouth every evening.    [provider]  lactulose  (CHRONULAC ) 10 GM/15ML solution Take 45 mLs (30 g total) by mouth 3 (three) times daily. 06/28/23   Colin Dawley, MD  lactulose  (CHRONULAC ) 10 GM/15ML solution Take 45 mLs (30 g total) by mouth 3 (three) times daily. 07/01/23   Delman Ferns, NP  levothyroxine  (SYNTHROID ) 175 MCG tablet Take 175 mcg by mouth daily. 05/24/23   [provider]  ondansetron  (ZOFRAN ) 4 MG tablet Take 4 mg by mouth 2 (two) times daily as needed for nausea or vomiting. 10/28/22   [provider]  pantoprazole  (PROTONIX ) 40 MG tablet Take 1 tablet (40 mg total) by mouth daily. Patient taking differently: Take 40 mg by mouth 4 (four) times a week. 01/25/22   Colin Dawley, MD  potassium chloride  SA (KLOR-CON  M) 20 MEQ tablet Take 2 tablets (40 mEq total) by mouth daily. Take while taking Lasix  06/28/23   Colin Dawley, MD  rifaximin  (XIFAXAN ) 550 MG TABS tablet Take 1 tablet (550 mg total) by mouth 2 (two) times daily. 06/28/23   Colin Dawley, MD  sertraline  (ZOLOFT ) 100 MG tablet Take 100 mg by mouth daily. 09/24/22  [provider]  spironolactone  (ALDACTONE ) 100 MG tablet Take 1 tablet (100 mg total) by mouth daily. 06/28/23   Colin Dawley, MD  tamsulosin  (FLOMAX ) 0.4 MG CAPS capsule Take 1 capsule (0.4 mg total) by mouth daily after supper. 03/27/23   Justina Oman, MD  torsemide  (DEMADEX ) 20 MG tablet Take 4 tablets (80 mg total) by mouth every morning AND 2 tablets (40 mg total) every evening. 06/28/23   Colin Dawley, MD  traZODone  (DESYREL ) 50 MG tablet Take 1 tablet (50 mg total) by mouth at bedtime. Patient taking  differently: Take 50 mg by mouth at bedtime as needed for sleep. 02/08/23   Colin Dawley, MD      Allergies    Patient has no known allergies.    Review of Systems   Review of Systems  Constitutional:  Negative for fever.  HENT:  Negative for congestion and sore throat.   Eyes: Negative.   Respiratory:  Negative for chest tightness and shortness of breath.        Negative except as mentioned in HPI.    Cardiovascular:  Negative for chest pain.  Gastrointestinal:  Positive for abdominal distention. Negative for abdominal pain, nausea and vomiting.  Genitourinary: Negative.   Musculoskeletal:  Negative for arthralgias, joint swelling and neck pain.  Skin: Negative.  Negative for rash and wound.  Neurological:  Negative for dizziness, weakness, light-headedness, numbness and headaches.  Psychiatric/Behavioral: Negative.      Physical Exam Updated Vital Signs BP 133/76 (BP Location: Right Arm)   Pulse 77   Temp 98.1 F (36.7 C) (Oral)   Resp 20   Ht 6\' 2"  (1.88 m)   Wt (!) 162.4 kg   SpO2 99%   BMI 45.97 kg/m  Physical Exam Vitals and nursing note reviewed.  Constitutional:      Appearance: He is well-developed.  HENT:     Head: Normocephalic and atraumatic.  Eyes:     Conjunctiva/sclera: Conjunctivae normal.  Cardiovascular:     Rate and Rhythm: Normal rate and regular rhythm.     Heart sounds: Normal heart sounds.  Pulmonary:     Effort: Pulmonary effort is normal.     Breath sounds: Normal breath sounds. No wheezing.  Abdominal:     General: Abdomen is protuberant. Bowel sounds are normal. There is distension.     Palpations: Abdomen is soft. There is no fluid wave.     Tenderness: There is no abdominal tenderness.     Hernia: A hernia is present. Hernia is present in the umbilical area.     Comments: Distended, protuberant abd,  no obvious fluid wave.  Musculoskeletal:        General: Normal range of motion.     Cervical back: Normal range of motion.   Skin:    General: Skin is warm and dry.     Comments: Slow leak of clear fluid through the skin of his left lower abdomen, palpation followed by slow development of clear liquid through the skin - no pitting edema of abdominal skin.  No visible skin changes.  No skin lesions,  no erythema around this site.   Neurological:     Mental Status: He is alert.     ED Results / Procedures / Treatments   Labs (all labs ordered are listed, but only abnormal results are displayed) Labs Reviewed  CBC WITH DIFFERENTIAL/PLATELET - Abnormal; Notable for the following components:      Result Value   WBC 3.9 (*)  RBC 2.98 (*)    Hemoglobin 8.4 (*)    HCT 25.8 (*)    RDW 16.4 (*)    Platelets 51 (*)    Lymphs Abs 0.6 (*)    All other components within normal limits  COMPREHENSIVE METABOLIC PANEL WITH GFR - Abnormal; Notable for the following components:   Sodium 132 (*)    Glucose, Bld 178 (*)    BUN 40 (*)    Creatinine, Ser 2.52 (*)    Calcium 8.6 (*)    Albumin  2.9 (*)    AST 46 (*)    Total Bilirubin 4.1 (*)    GFR, Estimated 28 (*)    All other components within normal limits  AMMONIA - Abnormal; Notable for the following components:   Ammonia 60 (*)    All other components within normal limits  PROTIME-INR - Abnormal; Notable for the following components:   Prothrombin Time 19.6 (*)    INR 1.6 (*)    All other components within normal limits  APTT - Abnormal; Notable for the following components:   aPTT 38 (*)    All other components within normal limits    EKG None  Radiology US  ASCITES (ABDOMEN LIMITED) Result Date: 07/14/2023 CLINICAL DATA:  Ascites. Fluid leaking from the left lower quadrant. EXAM: LIMITED ABDOMEN ULTRASOUND FOR ASCITES TECHNIQUE: Limited ultrasound survey for ascites was performed in all four abdominal quadrants. COMPARISON:  CT of the abdomen and pelvis 05/12/2018 FINDINGS: A small amount of ascites is present in the left upper quadrant adjacent to the  spleen. No other significant ascites is present. No significant ascites or subcutaneous fluid collection is present in the left lower quadrant, near the area where fluid is noted the skin surface. IMPRESSION: 1. Small amount of ascites in the left upper quadrant adjacent to the spleen. 2. No significant ascites or subcutaneous fluid collection in the left lower quadrant, near the area where fluid is noted the skin surface. Electronically Signed   By: Audree Leas M.D.   On: 07/14/2023 11:28    Procedures Procedures    Medications Ordered in ED Medications - No data to display  ED Course/ Medical Decision Making/ A&P                                 Medical Decision Making Pt with history of cirrhosis,  with weeping of skin localizing to the right lower abdomen. Not near known periumbilical hernia site. Labs per below,  essentially stable for this patient,  with low albumin  at 2.9, suspect secondary to 3rd spacing.  He does endorse 10 lb weight loss over the past week,  drinks 64 oz of fluid daily (5 bottles of water ),  other labs per below, many abnormalities but no great outliers from his baseline.  He was also concerned about his ammonia level- no confusion, A&O here,  compliant with his lactulose ,  having 3-4 loose stools daily.  After discussion with Dr. Mordechai April,  he will arrange close f/u with this pt in office this week.   Will provide abd pads for skin drainage issue.   Amount and/or Complexity of Data Reviewed Labs: ordered.    Details: Labs reviewed, he is anemic with a hemoglobin of 8.4 which is stable.  His ammonia is 60 today which appears to be steady around his baseline, he is not encephalopathic today.  BUN is 40, creatinine is 2.52, range is usually around  2.1.  He was advised to increase his fluid intake to 100 ounces daily for the next several days or until he is seen by GI this week.  Albumin  is 2.9, his bilirubin is 4.1 which is elevated today, his LFTs are stable., he  has a GFR of 28, INR 1.6 with a PT of 19.6, coags stable. Radiology: ordered.    Details: Ultrasound for ascites, small pocket at his left upper quadrant, no indication for paracentesis currently. Discussion of management or test interpretation with external provider(s): Patient was discussed with Dr. Mordechai April per above, he will plan close follow-up this week as an outpatient.           Final Clinical Impression(s) / ED Diagnoses Final diagnoses:  Cirrhosis of liver with ascites, unspecified hepatic cirrhosis type [facility])    Rx / DC Orders ED Discharge Orders     None         Jemina Scahill, PA-C 07/14/23 1917    Cheyenne Cotta, MD 07/16/23 1041

## 2023-07-14 NOTE — Discharge Instructions (Addendum)
 As discussed,  you do have a small amount of ascites fluid in your abdomen, but not enough where you would benefit from draining it off.  Your skin is weeping because it is retaining more fluid that it should, causing this external drainage.  We do recommend increasing your fluid intake for the next 3 days to 75 oz from your normal 50 oz restriction to help your kidney function.  You will be seeing your GI specialist this week who will guide you further. Call them if you have not heard from them by Monday afternoon with an appointment for this week.

## 2023-07-14 NOTE — ED Triage Notes (Signed)
 Pt c/o fluid leaking form abdomen. Hx of fluid medication daily and pt states he has lost at least 10lbs this week.

## 2023-07-15 ENCOUNTER — Telehealth: Payer: Self-pay

## 2023-07-15 NOTE — Telephone Encounter (Signed)
 Pt was seen in ED yesterday and he is calling because he states he is still leaking fluid from his side and wanted to see what he needs to do. Appt or go back to ED. Please advise

## 2023-07-16 ENCOUNTER — Ambulatory Visit (INDEPENDENT_AMBULATORY_CARE_PROVIDER_SITE_OTHER): Admitting: Gastroenterology

## 2023-07-16 ENCOUNTER — Encounter (INDEPENDENT_AMBULATORY_CARE_PROVIDER_SITE_OTHER): Payer: Self-pay | Admitting: Gastroenterology

## 2023-07-16 VITALS — BP 115/72 | HR 96 | Temp 98.1°F | Ht 74.0 in | Wt 342.9 lb

## 2023-07-16 DIAGNOSIS — K7460 Unspecified cirrhosis of liver: Secondary | ICD-10-CM

## 2023-07-16 DIAGNOSIS — R1032 Left lower quadrant pain: Secondary | ICD-10-CM | POA: Diagnosis not present

## 2023-07-16 DIAGNOSIS — R1084 Generalized abdominal pain: Secondary | ICD-10-CM

## 2023-07-16 DIAGNOSIS — R198 Other specified symptoms and signs involving the digestive system and abdomen: Secondary | ICD-10-CM

## 2023-07-16 NOTE — Telephone Encounter (Signed)
 Patient will be seen today!!

## 2023-07-16 NOTE — Progress Notes (Addendum)
 Referring Provider: Alliance, Abdul Abe* Primary Care Physician:  [facility], [facility] Primary GI Physician: Dr. Sammi Crick   Chief Complaint  Patient presents with   Cirrhosis    Patient went to ED on 4/20 having fluid leaking from abdomen. Having some sob.    HPI:   Daniel Crawford is a 68 y.o. 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 Endorsed 10 lb weight loss over the past week   Labs at that time with BUN 40, crea 2.52, cal 8.6, albumin  2.9, AST 46, t bili 4.1, sodium 132, potassium 3.6, ammonia 60, INR 1.6, wbc 3.9, hgb 8.4, plt 51k   US  Ascites done in the ED with small amount of ascites in LUQ, adjacent to spleen, no signficiant ascites or subcutaneous fluid collection in LLQ near area of fluid leakage  Current on aldactone  100mg  daily, torsemide  80/40mg  daily.   Present: States he started having leakage of fluid from his LLQ on Friday. He is unsure if he had more swelling prior to this or not, states his abdomen always looks full to him, reports it feels tighter at times but not currently. He is urinating frequently. He reports compliance with his diuretics, has not missed any doses. He denies any abdominal pain. He has not taken the bandage off since being in the ED a few days ago. Has some itching around the area he thinks is from the tape. Feels leg swelling is better than previously. He endorses chronic nausea. Appetite is not great this week. He stays cold but states he was told this was from his anemia. Unsure of any fevers at home. He is unsure about how much protein he is getting in his diet. He is doing boost shakes, atleast 2 per day.  Compliant with lactulose  and  xifaxan , reports about 4 BMs per day. He is a/ox4 today.   Previous MELD: April 2025  Cirrhosis related questions: Episodes of confusion/disorientation: no Taking diuretics? Yes as above  Beta blockers?  none Prior history of variceal banding? No  Prior episodes of SBP? No  Last liver imaging: December 2024 Alcohol use: no   Colonoscopy 2023 with cecal polyp unable to be removed and due for EGD for variceal screening but is not an anesthesia candidate. Conservative measures recommended.    Last EGD: 11/06/2019 - Normal esophagus. Dilated. - Portal hypertensive gastropathy. - Normal examined duodenum. - No specimens collected.  Filed Weights   07/16/23 1442  Weight: (!) 342 lb 14.4 oz (155.5 kg)     Past Medical History:  Diagnosis Date   Acute pancreatitis 10/02/2022   Anasarca 01/23/2022   Anemia    Anxiety    Asthma    CHF (congestive heart failure) (HCC)    CKD (chronic kidney disease)    D-dimer, elevated 01/22/2022   Decompensation of cirrhosis of liver (HCC) 05/21/2022   Depression    Diabetes mellitus without complication (HCC)    Dyspnea    Hepatic encephalopathy (HCC) 07/10/2022   Hypertension    Hypothyroidism    Liver cirrhosis secondary to NASH (HCC)    NASH (nonalcoholic steatohepatitis)    Other ascites 06/19/2019   Pancytopenia (HCC) 05/21/2022   Pre-diabetes    Spleen  enlarged    Thrombocytopenia [facility])     Past Surgical History:  Procedure Laterality Date   Bilateral hernia surgery     2006, 2007   CHOLECYSTECTOMY     2016    COLONOSCOPY WITH PROPOFOL  N/A 06/28/2021   Procedure: COLONOSCOPY WITH PROPOFOL ;  Surgeon: Ruby Corporal, MD;  [facility];  Service: Endoscopy;  Laterality: N/A;  1020   ESOPHAGEAL DILATION N/A 11/06/2019   Procedure: ESOPHAGEAL DILATION;  Surgeon: Urban Garden, MD;  Location: [facility];  Service: Gastroenterology;  Laterality: N/A;   ESOPHAGOGASTRODUODENOSCOPY (EGD) WITH PROPOFOL  N/A  11/06/2019   Procedure: ESOPHAGOGASTRODUODENOSCOPY (EGD) WITH PROPOFOL ;  Surgeon: Urban Garden, MD;  Location: [facility];  Service: Gastroenterology;  Laterality: N/A;  1045   IR RADIOLOGIST EVAL & MGMT  02/19/2022   LAPAROSCOPIC ASSISTED VENTRAL HERNIA REPAIR     Polyp removed     in January of 2018.    POLYPECTOMY  06/28/2021   Procedure: POLYPECTOMY INTESTINAL;  Surgeon: Ruby Corporal, MD;  Location: [facility];  Service: Endoscopy;;   RIGHT HEART CATH N/A 01/08/2022   Procedure: RIGHT HEART CATH;  Surgeon: Lucendia Rusk, MD;  Location: [facility] INVASIVE CV LAB;  Service: Cardiovascular;  Laterality: N/A;   RIGHT HEART CATH N/A 01/29/2023   Procedure: RIGHT HEART CATH;  Surgeon: Darlis Eisenmenger, MD;  Location: [facility] INVASIVE CV LAB;  Service: Cardiovascular;  Laterality: N/A;    Current Outpatient Medications  Medication Sig Dispense Refill   acetaminophen  (TYLENOL ) 325 MG tablet Take 2 tablets (650 mg total) by mouth every 6 (six) hours as needed for mild pain (pain score 1-3), fever or headache (or Fever >/= 101). 100 tablet 0   albuterol  (VENTOLIN  HFA) 108 (90 Base) MCG/ACT inhaler Inhale 2 puffs into the lungs every 6 (six) hours as needed for wheezing or shortness of breath. 18 g 2   Cholecalciferol (VITAMIN D3) 1.25 MG (50000 UT) CAPS Take 1 capsule by mouth once a week. Wednesdays     dapagliflozin  propanediol (FARXIGA ) 10 MG TABS tablet Take 1 tablet (10 mg total) by mouth daily before breakfast. 90 tablet 3   hydrOXYzine  (VISTARIL ) 50 MG capsule Take 1 capsule (50 mg total) by mouth 3 (three) times daily as needed for anxiety or nausea. (Patient taking differently: Take 50 mg by mouth in the morning and at bedtime.) 90 capsule 2   lactose free nutrition (BOOST) LIQD Take 237 mLs by mouth every evening.     lactulose  (CHRONULAC ) 10 GM/15ML solution Take 45 mLs (30 g total) by mouth 3 (three) times daily. 473 mL 3   lactulose  (CHRONULAC ) 10 GM/15ML solution Take 45  mLs (30 g total) by mouth 3 (three) times daily. 946 mL 2   levothyroxine  (SYNTHROID ) 175 MCG tablet Take 175 mcg by mouth daily.     ondansetron  (ZOFRAN ) 4 MG tablet Take 4 mg by mouth 2 (two) times daily as needed for nausea or vomiting.     pantoprazole  (PROTONIX ) 40 MG tablet Take 1 tablet (40 mg total) by mouth daily. (Patient taking differently: Take 40 mg by mouth 4 (four) times a week.) 90 tablet 3   potassium chloride  SA (KLOR-CON  M) 20 MEQ tablet Take 2 tablets (40 mEq total) by mouth daily. Take while taking Lasix  60 tablet 5   rifaximin  (XIFAXAN ) 550 MG TABS tablet Take 1 tablet (550 mg total) by mouth 2 (two) times daily. 60 tablet 2   sertraline  (ZOLOFT ) 100  MG tablet Take 100 mg by mouth daily.     spironolactone  (ALDACTONE ) 100 MG tablet Take 1 tablet (100 mg total) by mouth daily. 90 tablet 2   tamsulosin  (FLOMAX ) 0.4 MG CAPS capsule Take 1 capsule (0.4 mg total) by mouth daily after supper.     torsemide  (DEMADEX ) 20 MG tablet Take 4 tablets (80 mg total) by mouth every morning AND 2 tablets (40 mg total) every evening. 180 tablet 5   traZODone  (DESYREL ) 50 MG tablet Take 1 tablet (50 mg total) by mouth at bedtime. (Patient taking differently: Take 50 mg by mouth at bedtime as needed for sleep.) 90 tablet 3   No current facility-administered medications for this visit.    Allergies as of 07/16/2023   (No Known Allergies)    Social History   Socioeconomic History   Marital status: Single    Spouse name: Not on file   Number of children: Not on file   Years of education: Not on file   Highest education level: Not on file  Occupational History   Not on file  Tobacco Use   Smoking status: Former    Current packs/day: 0.00    Average packs/day: 1 pack/day for 20.0 years (20.0 ttl pk-yrs)    Types: Cigarettes    Start date: 81    Quit date: 2017    Years since quitting: 8.3    Passive exposure: Current   Smokeless tobacco: Never  Vaping Use   Vaping status: Never  Used  Substance and Sexual Activity   Alcohol use: No   Drug use: No   Sexual activity: Not Currently  Other Topics Concern   Not on file  Social History Narrative   Not on file   Social Drivers of Health   Financial Resource Strain: Low Risk  (03/28/2021)   [facility], [facility]   Overall Financial Resource Strain (CARDIA)    Difficulty of Paying Living Expenses: Not hard at all  Food Insecurity: No Food Insecurity (06/25/2023)   Hunger Vital Sign    Worried About Running Out of Food in the Last Year: Never true    Ran Out of Food in the Last Year: Never true  Transportation Needs: No Transportation Needs (06/25/2023)   PRAPARE - Administrator, Civil Service (Medical): No    Lack of Transportation (Non-Medical): No  Physical Activity: Not on file  Stress: Not on file  Social Connections: Moderately Integrated (06/25/2023)   Social Connection and Isolation Panel [NHANES]    Frequency of Communication with Friends and Family: More than three times a week    Frequency of Social Gatherings with Friends and Family: More than three times a week    Attends Religious Services: 1 to 4 times per year    Active Member of [employer] or Organizations: Yes    Attends Banker Meetings: 1 to 4 times per year    Marital Status: Divorced   Review of systems General: negative for malaise, night sweats, fever, chills, weight loss Neck: Negative for lumps, goiter, pain and significant neck swelling Resp: Negative for cough, wheezing, dyspnea at rest CV: Negative for chest pain, leg swelling, palpitations, orthopnea GI: denies melena, hematochezia, vomiting, diarrhea, constipation, dysphagia, odyonophagia, early satiety or unintentional weight loss. +leakage of fluid from LLQ MSK: Negative for joint pain or swelling, back pain, and muscle pain. Derm: Negative for itching or rash Psych: Denies depression, anxiety, memory loss, confusion. No homicidal or  suicidal ideation.  Heme: Negative for prolonged bleeding, bruising easily, and swollen nodes. Endocrine: Negative for cold or heat intolerance, polyuria, polydipsia and goiter. Neuro: negative for tremor, gait imbalance, syncope and seizures. The remainder of the review of systems is noncontributory.  Physical Exam: BP 115/72   Pulse 96   Temp 98.1 F (36.7 C) (Oral)   Ht 6\' 2"  (1.88 m)   Wt (!) 342 lb 14.4 oz (155.5 kg)   BMI 44.03 kg/m  General:   Alert and oriented. No distress noted. Pleasant and cooperative.  Head:  Normocephalic and atraumatic. Eyes:  Conjuctiva clear without scleral icterus. Mouth:  Oral mucosa pink and moist. Good dentition. No lesions. Heart: Normal rate and rhythm, s1 and s2 heart sounds present.  Lungs: Clear lung sounds in all lobes. Respirations equal and unlabored. Abdomen:  +BS, soft, warm to the touch and mild erythema of diffuse abdomen, bandage in place in LLQ was removed, does not appear to be leaking fluid at this time, some pale yellow drainage noted on dressing. No overt tautness to abdomen but some mild pitting edema to lower abdomen. Mild tenderness to palpation of diffuse abdomen  Derm: No palmar erythema or jaundice Msk:  Symmetrical without gross deformities. Normal posture. Extremities:  non pitting edema  Neurologic:  Alert and  oriented x4, no asterixis  Psych:  Alert and cooperative. Normal mood and affect.  Invalid input(s): "6 MONTHS"   ASSESSMENT: Daniel Crawford is a 68 y.o. male presenting today for leakage of fluid from LLQ   Patient with leakage of fluid from LLQ since Friday, seen in the ED on 4/20 fairly consistent with his baseline and US  ascites without significant presence of fluid. He denies tautness to his abdomen. Dressing was placed at that time which patient has not removed. No obvious leakage of fluid on exam today when bandage was removed, some pale yellow drainage noted to bandage. He denies abdominal pain, but notably has  diffuse tenderness as well as erythema and warmth to his entire abdomen,  concerning for possible cellulitis. He is afebrile here today. Abdomen is not taut but some pitting edema noted to lower abdomen, he feels leg swelling is actually improved today and has been compliant with his diuretics. Albumin  on 4/20 was 2.9, he is doing boost shakes BID which I encouraged him to continue. Unfortunately not much room to increase diuretics given his renal function and sodium. Will proceed with STAT CT A/P wo contrast (due to low GFR). He should make me aware if he has any new associated or worsening symptoms.  He is up to date on liver imaging and MELD labs. MELD 3.0 on 4/20 was 30, unfortunately he is not a transplant candidate due to multimorbidities.    PLAN:  -continue boost shakes 2-3 times per day - Reduce salt intake to <2 g per day - Can take Tylenol  max of 2 g per day (650 mg q8h) for pain - Avoid NSAIDs for pain - Avoid eating raw oysters/shellfish -continue aldactone /lasix  at current dosage for now  -continue lactulose  and xifaxan   -obtain stat CT A/P without contrast to rule out abdominal wall cellulitis   All questions were answered, patient verbalized understanding and is in agreement with plan as outlined above.   Follow Up: 1 month   Daniel Crawford L. Adrien Alberta, MSN, APRN, AGNP-C Adult-Gerontology Nurse Practitioner [facility]  I have reviewed the note and agree with the APP's assessment as described in this progress note  Bearl Limes  Sammi Crick, MD Gastroenterology and Hepatology [facility]

## 2023-07-16 NOTE — Telephone Encounter (Signed)
 Noted.

## 2023-07-16 NOTE — Patient Instructions (Addendum)
-  We will get a stat CT of your abdomen to further evaluate for infection  -continue boost shakes 2-3 times per day - Reduce salt intake to <2 g per day - Can take Tylenol  max of 2 g per day (650 mg q8h) for pain - Avoid NSAIDs for pain - Avoid eating raw oysters/shellfish -continue aldactone /lasix  at current dosage for now  -continue lactulose  and xifaxan    Follow up 1 month

## 2023-07-17 ENCOUNTER — Ambulatory Visit (HOSPITAL_COMMUNITY)
Admission: RE | Admit: 2023-07-17 | Discharge: 2023-07-17 | Disposition: A | Discharge status: Home / Self Care | Source: Ambulatory Visit | Attending: Gastroenterology | Admitting: Gastroenterology

## 2023-07-17 ENCOUNTER — Other Ambulatory Visit (INDEPENDENT_AMBULATORY_CARE_PROVIDER_SITE_OTHER): Payer: Self-pay | Admitting: Gastroenterology

## 2023-07-17 DIAGNOSIS — R1084 Generalized abdominal pain: Secondary | ICD-10-CM | POA: Insufficient documentation

## 2023-07-17 MED ORDER — CEPHALEXIN 500 MG PO CAPS: 500.0000 mg | ORAL_CAPSULE | Freq: Three times a day (TID) | ORAL | 0 refills | Status: AC

## 2023-07-25 ENCOUNTER — Ambulatory Visit: Payer: Medicare HMO | Admitting: Gastroenterology

## 2023-08-02 ENCOUNTER — Encounter (INDEPENDENT_AMBULATORY_CARE_PROVIDER_SITE_OTHER): Payer: Self-pay | Admitting: *Deleted

## 2023-08-22 ENCOUNTER — Ambulatory Visit (INDEPENDENT_AMBULATORY_CARE_PROVIDER_SITE_OTHER): Admitting: Gastroenterology

## 2023-09-06 ENCOUNTER — Ambulatory Visit: Admitting: Podiatry

## 2024-01-29 ENCOUNTER — Encounter (INDEPENDENT_AMBULATORY_CARE_PROVIDER_SITE_OTHER): Payer: Self-pay | Admitting: Gastroenterology
